# Patient Record
Sex: Female | Born: 1955 | Race: White | Hispanic: No | Marital: Married | Smoking: Former smoker
Health system: Southern US, Community
[De-identification: ages and names within clinical notes are randomized; demographics above are authoritative.]

## PROBLEM LIST (undated history)

## (undated) DIAGNOSIS — G8929 Other chronic pain: Secondary | ICD-10-CM

## (undated) DIAGNOSIS — F4310 Post-traumatic stress disorder, unspecified: Secondary | ICD-10-CM

## (undated) DIAGNOSIS — Z8739 Personal history of other diseases of the musculoskeletal system and connective tissue: Secondary | ICD-10-CM

## (undated) DIAGNOSIS — R7303 Prediabetes: Secondary | ICD-10-CM

## (undated) DIAGNOSIS — F329 Major depressive disorder, single episode, unspecified: Secondary | ICD-10-CM

## (undated) DIAGNOSIS — F988 Other specified behavioral and emotional disorders with onset usually occurring in childhood and adolescence: Secondary | ICD-10-CM

## (undated) DIAGNOSIS — M810 Age-related osteoporosis without current pathological fracture: Secondary | ICD-10-CM

## (undated) DIAGNOSIS — G9332 Myalgic encephalomyelitis/chronic fatigue syndrome: Secondary | ICD-10-CM

## (undated) DIAGNOSIS — B001 Herpesviral vesicular dermatitis: Secondary | ICD-10-CM

## (undated) DIAGNOSIS — Z87442 Personal history of urinary calculi: Secondary | ICD-10-CM

## (undated) DIAGNOSIS — T50905A Adverse effect of unspecified drugs, medicaments and biological substances, initial encounter: Secondary | ICD-10-CM

## (undated) DIAGNOSIS — R45851 Suicidal ideations: Secondary | ICD-10-CM

## (undated) DIAGNOSIS — J45909 Unspecified asthma, uncomplicated: Secondary | ICD-10-CM

## (undated) DIAGNOSIS — R0982 Postnasal drip: Secondary | ICD-10-CM

## (undated) DIAGNOSIS — D8989 Other specified disorders involving the immune mechanism, not elsewhere classified: Secondary | ICD-10-CM

## (undated) DIAGNOSIS — F19921 Other psychoactive substance use, unspecified with intoxication with delirium: Secondary | ICD-10-CM

## (undated) DIAGNOSIS — M4690 Unspecified inflammatory spondylopathy, site unspecified: Secondary | ICD-10-CM

## (undated) DIAGNOSIS — D649 Anemia, unspecified: Secondary | ICD-10-CM

## (undated) DIAGNOSIS — R4689 Other symptoms and signs involving appearance and behavior: Secondary | ICD-10-CM

## (undated) DIAGNOSIS — T4145XA Adverse effect of unspecified anesthetic, initial encounter: Secondary | ICD-10-CM

## (undated) DIAGNOSIS — M51369 Other intervertebral disc degeneration, lumbar region without mention of lumbar back pain or lower extremity pain: Secondary | ICD-10-CM

## (undated) DIAGNOSIS — N61 Mastitis without abscess: Secondary | ICD-10-CM

## (undated) DIAGNOSIS — Z803 Family history of malignant neoplasm of breast: Secondary | ICD-10-CM

## (undated) DIAGNOSIS — M5136 Other intervertebral disc degeneration, lumbar region: Secondary | ICD-10-CM

## (undated) DIAGNOSIS — R5382 Chronic fatigue, unspecified: Secondary | ICD-10-CM

## (undated) DIAGNOSIS — M069 Rheumatoid arthritis, unspecified: Secondary | ICD-10-CM

## (undated) DIAGNOSIS — R410 Disorientation, unspecified: Secondary | ICD-10-CM

## (undated) DIAGNOSIS — M797 Fibromyalgia: Secondary | ICD-10-CM

## (undated) DIAGNOSIS — F419 Anxiety disorder, unspecified: Secondary | ICD-10-CM

## (undated) DIAGNOSIS — I10 Essential (primary) hypertension: Secondary | ICD-10-CM

## (undated) DIAGNOSIS — M545 Low back pain, unspecified: Secondary | ICD-10-CM

## (undated) DIAGNOSIS — F32A Depression, unspecified: Secondary | ICD-10-CM

## (undated) DIAGNOSIS — M06811 Other specified rheumatoid arthritis, right shoulder: Secondary | ICD-10-CM

## (undated) DIAGNOSIS — T8859XA Other complications of anesthesia, initial encounter: Secondary | ICD-10-CM

## (undated) DIAGNOSIS — J189 Pneumonia, unspecified organism: Secondary | ICD-10-CM

## (undated) HISTORY — PX: JOINT REPLACEMENT: SHX530

## (undated) HISTORY — DX: Other symptoms and signs involving appearance and behavior: R46.89

## (undated) HISTORY — DX: Herpesviral vesicular dermatitis: B00.1

## (undated) HISTORY — DX: Age-related osteoporosis without current pathological fracture: M81.0

## (undated) HISTORY — DX: Family history of malignant neoplasm of breast: Z80.3

## (undated) HISTORY — PX: BACK SURGERY: SHX140

## (undated) HISTORY — PX: COLONOSCOPY: SHX174

## (undated) HISTORY — PX: POSTERIOR LUMBAR FUSION: SHX6036

---
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
## 1985-03-27 HISTORY — PX: TONSILLECTOMY: SUR1361

## 1986-03-27 HISTORY — PX: TUBAL LIGATION: SHX77

## 2001-03-27 HISTORY — PX: AUGMENTATION MAMMAPLASTY: SUR837

## 2001-03-27 HISTORY — PX: COMBINED ABDOMINOPLASTY AND LIPOSUCTION: SUR284

## 2006-01-19 ENCOUNTER — Emergency Department (HOSPITAL_COMMUNITY): Admission: EM | Admit: 2006-01-19 | Discharge: 2006-01-19 | Payer: Self-pay | Admitting: Family Medicine

## 2006-07-06 ENCOUNTER — Emergency Department (HOSPITAL_COMMUNITY): Admission: EM | Admit: 2006-07-06 | Discharge: 2006-07-06 | Payer: Self-pay | Admitting: Emergency Medicine

## 2006-07-08 ENCOUNTER — Encounter: Admission: RE | Admit: 2006-07-08 | Discharge: 2006-07-08 | Payer: Self-pay | Admitting: Family Medicine

## 2006-07-10 ENCOUNTER — Emergency Department (HOSPITAL_COMMUNITY): Admission: EM | Admit: 2006-07-10 | Discharge: 2006-07-10 | Payer: Self-pay | Admitting: *Deleted

## 2007-02-04 ENCOUNTER — Other Ambulatory Visit: Admission: RE | Admit: 2007-02-04 | Discharge: 2007-02-04 | Payer: Self-pay | Admitting: Family Medicine

## 2007-02-19 ENCOUNTER — Encounter: Admission: RE | Admit: 2007-02-19 | Discharge: 2007-02-19 | Payer: Self-pay | Admitting: Family Medicine

## 2007-03-28 HISTORY — PX: BLADDER SUSPENSION: SHX72

## 2008-03-04 ENCOUNTER — Other Ambulatory Visit: Admission: RE | Admit: 2008-03-04 | Discharge: 2008-03-04 | Payer: Self-pay | Admitting: Family Medicine

## 2008-07-15 ENCOUNTER — Encounter: Admission: RE | Admit: 2008-07-15 | Discharge: 2008-07-15 | Payer: Self-pay | Admitting: Family Medicine

## 2008-07-31 ENCOUNTER — Ambulatory Visit (HOSPITAL_BASED_OUTPATIENT_CLINIC_OR_DEPARTMENT_OTHER): Admission: RE | Admit: 2008-07-31 | Discharge: 2008-07-31 | Payer: Self-pay | Admitting: Urology

## 2008-10-28 ENCOUNTER — Encounter: Admission: RE | Admit: 2008-10-28 | Discharge: 2008-12-02 | Payer: Self-pay | Admitting: Family Medicine

## 2008-12-12 LAB — HM COLONOSCOPY: HM Colonoscopy: NEGATIVE

## 2008-12-14 ENCOUNTER — Encounter: Admission: RE | Admit: 2008-12-14 | Discharge: 2008-12-14 | Payer: Self-pay | Admitting: Family Medicine

## 2009-01-05 ENCOUNTER — Encounter: Admission: RE | Admit: 2009-01-05 | Discharge: 2009-01-05 | Payer: Self-pay | Admitting: Family Medicine

## 2009-01-06 ENCOUNTER — Encounter: Admission: RE | Admit: 2009-01-06 | Discharge: 2009-01-06 | Payer: Self-pay | Admitting: Family Medicine

## 2009-06-06 ENCOUNTER — Emergency Department (HOSPITAL_COMMUNITY): Admission: EM | Admit: 2009-06-06 | Discharge: 2009-06-07 | Payer: Self-pay | Admitting: Emergency Medicine

## 2009-06-12 ENCOUNTER — Emergency Department (HOSPITAL_COMMUNITY): Admission: EM | Admit: 2009-06-12 | Discharge: 2009-06-12 | Payer: Self-pay | Admitting: Emergency Medicine

## 2009-06-15 ENCOUNTER — Encounter: Admission: RE | Admit: 2009-06-15 | Discharge: 2009-06-15 | Payer: Self-pay | Admitting: Family Medicine

## 2009-08-20 ENCOUNTER — Ambulatory Visit: Payer: Self-pay | Admitting: Internal Medicine

## 2009-08-20 DIAGNOSIS — M545 Low back pain, unspecified: Secondary | ICD-10-CM | POA: Insufficient documentation

## 2009-08-20 DIAGNOSIS — J309 Allergic rhinitis, unspecified: Secondary | ICD-10-CM | POA: Insufficient documentation

## 2009-08-20 DIAGNOSIS — M2550 Pain in unspecified joint: Secondary | ICD-10-CM | POA: Insufficient documentation

## 2009-08-20 DIAGNOSIS — Z87442 Personal history of urinary calculi: Secondary | ICD-10-CM | POA: Insufficient documentation

## 2009-08-25 ENCOUNTER — Telehealth: Payer: Self-pay | Admitting: Internal Medicine

## 2009-08-25 LAB — CONVERTED CEMR LAB
ALT: 16 units/L (ref 0–35)
AST: 17 units/L (ref 0–37)
Albumin: 4.2 g/dL (ref 3.5–5.2)
Basophils Absolute: 0 10*3/uL (ref 0.0–0.1)
CO2: 30 meq/L (ref 19–32)
Chloride: 102 meq/L (ref 96–112)
Creatinine, Ser: 0.6 mg/dL (ref 0.4–1.2)
Eosinophils Absolute: 0 10*3/uL (ref 0.0–0.7)
GFR calc non Af Amer: 109.08 mL/min (ref >60)
HCT: 37.5 % (ref 36.0–46.0)
Ketones, ur: NEGATIVE mg/dL
MCHC: 34.4 g/dL (ref 30.0–36.0)
Monocytes Absolute: 0.4 10*3/uL (ref 0.1–1.0)
Platelets: 386 10*3/uL (ref 150.0–400.0)
Potassium: 4.5 meq/L (ref 3.5–5.1)
RBC: 4.49 M/uL (ref 3.87–5.11)
RDW: 13.3 % (ref 11.5–14.6)
Rhuematoid fact SerPl-aCnc: 25.3 intl units/mL — ABNORMAL HIGH (ref 0.0–20.0)
Sodium: 141 meq/L (ref 135–145)
Specific Gravity, Urine: 1.015 (ref 1.000–1.030)
Total Protein, Urine: NEGATIVE mg/dL
Total Protein: 7.3 g/dL (ref 6.0–8.3)
Urobilinogen, UA: 0.2 (ref 0.0–1.0)
pH: 6 (ref 5.0–8.0)

## 2009-08-30 ENCOUNTER — Encounter: Payer: Self-pay | Admitting: Internal Medicine

## 2009-10-21 ENCOUNTER — Emergency Department (HOSPITAL_COMMUNITY): Admission: EM | Admit: 2009-10-21 | Discharge: 2009-10-21 | Payer: Self-pay | Admitting: Family Medicine

## 2009-11-04 ENCOUNTER — Telehealth: Payer: Self-pay | Admitting: Internal Medicine

## 2009-11-15 ENCOUNTER — Ambulatory Visit: Payer: Self-pay | Admitting: Licensed Clinical Social Worker

## 2009-11-16 ENCOUNTER — Telehealth: Payer: Self-pay | Admitting: Internal Medicine

## 2009-11-24 ENCOUNTER — Telehealth: Payer: Self-pay | Admitting: Internal Medicine

## 2009-11-26 ENCOUNTER — Ambulatory Visit: Payer: Self-pay | Admitting: Licensed Clinical Social Worker

## 2009-12-01 ENCOUNTER — Encounter: Admission: RE | Admit: 2009-12-01 | Discharge: 2009-12-01 | Payer: Self-pay | Admitting: Orthopedic Surgery

## 2009-12-03 ENCOUNTER — Encounter (INDEPENDENT_AMBULATORY_CARE_PROVIDER_SITE_OTHER): Payer: Self-pay | Admitting: *Deleted

## 2009-12-14 ENCOUNTER — Emergency Department (HOSPITAL_COMMUNITY): Admission: EM | Admit: 2009-12-14 | Discharge: 2009-12-14 | Payer: Self-pay | Admitting: Family Medicine

## 2009-12-30 ENCOUNTER — Encounter
Admission: RE | Admit: 2009-12-30 | Discharge: 2009-12-30 | Payer: Self-pay | Source: Home / Self Care | Attending: Physical Medicine & Rehabilitation | Admitting: Physical Medicine & Rehabilitation

## 2010-02-11 ENCOUNTER — Ambulatory Visit: Payer: Self-pay | Admitting: Licensed Clinical Social Worker

## 2010-02-21 ENCOUNTER — Telehealth: Payer: Self-pay | Admitting: Internal Medicine

## 2010-03-27 HISTORY — PX: SHOULDER ARTHROSCOPY: SHX128

## 2010-04-13 LAB — HM PAP SMEAR: HM Pap smear: NEGATIVE

## 2010-04-17 ENCOUNTER — Encounter: Payer: Self-pay | Admitting: Family Medicine

## 2010-04-26 NOTE — Progress Notes (Signed)
Summary: RESULTS  Phone Note Call from Patient Call back at [phone]   Summary of Call: Patient is requesting results of labs. The steriod injection did no help w/pain. She is req rx for pain.  Initial call taken by: Lamar Sprinkles, CMA,  August 25, 2009 11:48 AM  Follow-up for Phone Call        she has a very mild, probably insignificant increase in her rheumatoid factor but all is else is normal Follow-up by: Etta Grandchild MD,  August 25, 2009 11:53 AM  Additional Follow-up for Phone Call Additional follow up Details #1::        Patient is requesting rx for pain. Additional Follow-up by: Lamar Sprinkles, CMA,  August 25, 2009 11:55 AM    Additional Follow-up for Phone Call Additional follow up Details #2::    Spoke with pt and advised per MD/ rx put upfront to pick up.Alvy Beal Archie CMA  August 25, 2009 4:26 PM   New/Updated Medications: BUTRANS 5 MCG/HR PTWK (BUPRENORPHINE) Apply one each week. Remove after 7 days and apply a new one if needed. Prescriptions: BUTRANS 5 MCG/HR PTWK (BUPRENORPHINE) Apply one each week. Remove after 7 days and apply a new one if needed.  #4 x 5   Entered and Authorized by:   Etta Grandchild MD   Signed by:   Etta Grandchild MD on 08/25/2009   Method used:   Print then Give to Patient   RxID:   [id number]

## 2010-04-26 NOTE — Progress Notes (Signed)
Summary: Pain med  Phone Note Call from Patient Call back at [phone]   Summary of Call: Patient is requesting a refill of the last pain med given by Dr Yetta Barre. She says pain med given by ortho was too strong and then they gave her tramadol, which does not help.  Initial call taken by: Lamar Sprinkles, CMA,  November 04, 2009 2:50 PM  Follow-up for Phone Call        she has 5 refills on butrans- I have nothing else to offer Follow-up by: Etta Grandchild MD,  November 04, 2009 2:58 PM  Additional Follow-up for Phone Call Additional follow up Details #1::        left mess to call office back..................Marland KitchenLamar Sprinkles, CMA  November 04, 2009 4:50 PM   Pt informed  Additional Follow-up by: Lamar Sprinkles, CMA,  November 04, 2009 4:58 PM

## 2010-04-26 NOTE — Letter (Signed)
Summary: [facility] Consult Scheduled Letter  [facility]  [address] [place], [place] [address]   Phone: [phone]  Fax: [phone]      12/03/2009 MRN: [id number]  [facility] [address] [place], [place]  [address]    Dear Ms. Conni Elliot,      We have scheduled an appointment for you.  At the recommendation of Dr.Jones, we have scheduled you a consult with Dr Wynn Banker on 01/04/10 at 12:00pm.  Their phone number is [phone].  If this appointment day and time is not convenient for you, please feel free to call the office of the doctor you are being referred to at the number listed above and reschedule the appointment.    [facility] [address] [place], [address] [place], [place] [address]    Thank you,  Patient Care Coordinator [facility]

## 2010-04-26 NOTE — Assessment & Plan Note (Signed)
Summary: NEW/UNITED HC/#/ CD   Vital Signs:  Patient profile:   55 year old female Height:      64 inches Weight:      164 pounds BMI:     28.25 O2 Sat:      97 % on Room air Temp:     98.9 degrees F oral Pulse rate:   74 / minute Pulse rhythm:   regular Resp:     16 per minute BP sitting:   120 / 84  (left arm) Cuff size:   large  Vitals Entered By: Rock Nephew CMA (Aug 20, 2009 1:01 PM)  Nutrition Counseling: Patient's BMI is greater than 25 and therefore counseled on weight management options.  O2 Flow:  Room air  Primary Care Provider:  Etta Grandchild MD   History of Present Illness: New to me she complains of a 6 month hx. of pain and swelling in her hands (MCP,DIP,PIP joints). She also has LBP and has seen Dr. 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 She will see Dr. Dierdre Forth soon but she wants something for pain today. She can't take meds due to allergies.  Preventive Screening-Counseling & Management  Alcohol-Tobacco     Smoking Status: never  Caffeine-Diet-Exercise     Does Patient Exercise: yes      Drug Use:  no.    Allergies (verified): 1)  ! Asa 2)  ! Ibuprofen 3)  ! Tramadol Hcl (Tramadol Hcl)  Past History:  Past Medical History: Allergic rhinitis Low back pain Nephrolithiasis, hx of  Past Surgical History: Tubal ligation Tonsillectomy  Family History: Family History of Arthritis Family History Breast cancer 1st degree relative <50 Family History Ovarian cancer  Social History: Occupation: works for [employer] [place] Married Never Smoked Alcohol use-no Drug use-no Regular exercise-yes Smoking Status:  never Drug Use:  no Does Patient Exercise:  yes  Review of Systems       The patient complains of weight gain.  The patient denies anorexia, fever, weight loss, chest pain, syncope, dyspnea on exertion, peripheral edema, prolonged cough, headaches, hemoptysis, abdominal pain, melena, hematochezia, severe  indigestion/heartburn, hematuria, depression, angioedema, and breast masses.   MS:  Complains of joint pain, low back pain, and stiffness; denies joint redness, joint swelling, loss of strength, mid back pain, muscle aches, muscle, cramps, muscle weakness, and thoracic pain.  Physical Exam  General:  alert, well-developed, well-nourished, well-hydrated, appropriate dress, normal appearance, healthy-appearing, cooperative to examination, good hygiene, and overweight-appearing.   Head:  normocephalic, atraumatic, no abnormalities observed, and no abnormalities palpated.   Eyes:  vision grossly intact.   Ears:  R ear normal and L ear normal.   Mouth:  Oral mucosa and oropharynx without lesions or exudates.  Teeth in good repair. Neck:  supple, full ROM, no masses, no thyromegaly, no JVD, normal carotid upstroke, no carotid bruits, no cervical lymphadenopathy, and no neck tenderness.   Lungs:  Normal respiratory effort, chest expands symmetrically. Lungs are clear to auscultation, no crackles or wheezes. Heart:  Normal rate and regular rhythm. S1 and S2 normal without gallop, murmur, click, rub or other extra sounds. Abdomen:  soft, non-tender, normal bowel sounds, no distention, no masses, no guarding, no rigidity, no rebound tenderness, no abdominal hernia, no inguinal hernia, no hepatomegaly, and no splenomegaly.   Msk:  normal ROM, no joint tenderness, no joint warmth, no redness over joints, no joint deformities, no joint instability, no crepitation, no muscle atrophy,  enlarged MCP joints, enlarged PIP joints, and enlarged DIP joints.   Pulses:  R and L carotid,radial,femoral,dorsalis pedis and posterior tibial pulses are full and equal bilaterally Extremities:  No clubbing, cyanosis, edema, or deformity noted with normal full range of motion of all joints.   Neurologic:  No cranial nerve deficits noted. Station and gait are normal. Plantar reflexes are down-going bilaterally. DTRs are symmetrical  throughout. Sensory, motor and coordinative functions appear intact. Skin:  turgor normal, color normal, no rashes, no suspicious lesions, no ecchymoses, no petechiae, no purpura, no ulcerations, and no edema.   Cervical Nodes:  no anterior cervical adenopathy and no posterior cervical adenopathy.   Axillary Nodes:  no R axillary adenopathy and no L axillary adenopathy.   Inguinal Nodes:  no R inguinal adenopathy and no L inguinal adenopathy.   Psych:  Cognition and judgment appear intact. Alert and cooperative with normal attention span and concentration. No apparent delusions, illusions, hallucinations   Impression & Recommendations:  Problem # 1:  ARTHRALGIA (ICD-719.40) Assessment New this sounds like OA but I have a litlle concern about CTDz. so will look at some inflammatory markers. will give steroids fr short term symptom relief. she will keep appt. with Rheum. Orders: Venipuncture (60630) TLB-BMP (Basic Metabolic Panel-BMET) (80048-METABOL) TLB-CBC Platelet - w/Differential (85025-CBCD) TLB-Hepatic/Liver Function Pnl (80076-HEPATIC) TLB-TSH (Thyroid Stimulating Hormone) (84443-TSH) TLB-Rheumatoid Factor (RA) (16010-XN) TLB-Sedimentation Rate (ESR) (85652-ESR) T-Antinuclear Antib (ANA) (23557-32202) TLB-CRP-High Sensitivity (C-Reactive Protein) (86140-FCRP) TLB-Udip w/ Micro (81001-URINE) TLB-CK Total Only(Creatine Kinase/CPK) (82550-CK) Admin of Therapeutic Inj  intramuscular or subcutaneous (54270) Depo- Medrol 40mg  (J1030) Depo- Medrol 80mg  (J1040)  Problem # 2:  LOW BACK PAIN (ICD-724.2) Assessment: Unchanged  Complete Medication List: 1)  Effexor Xr 75 Mg Xr24h-cap (Venlafaxine hcl) .... Take 1 tablet by mouth three times a day 2)  Vyvanse 40 Mg Caps (Lisdexamfetamine dimesylate) 3)  Epi Pen   Patient Instructions: 1)  Please schedule a follow-up appointment in 2 weeks. 2)  It is important that you exercise regularly at least 20 minutes 5 times a week. If you  develop chest pain, have severe difficulty breathing, or feel very tired , stop exercising immediately and seek medical attention. 3)  You need to lose weight. Consider a lower calorie diet and regular exercise.  4)  Take 650-1000mg  of Tylenol every 4-6 hours as needed for relief of pain or comfort of fever AVOID taking more than 4000mg   in a 24 hour period (can cause liver damage in higher doses).  Preventive Care Screening  Last Tetanus Booster:    Date:  03/27/2009    Results:  Historical     Not Administered:    Influenza Vaccine not given due to: declined    Medication Administration  Injection # 1:    Medication: Depo- Medrol 80mg     Diagnosis: ARTHRALGIA (ICD-719.40)    Route: IM    Site: RUOQ gluteus    Exp Date: 06/2012    Lot #: [id number]    Mfr: pfizer    Patient tolerated injection without complications    Given by: Rock Nephew CMA (Aug 20, 2009 1:34 PM)  Injection # 2:    Medication: Depo- Medrol 40mg     Diagnosis: ARTHRALGIA (ICD-719.40)    Route: IM    Site: RUOQ gluteus    Exp Date: 06/2012    Lot #: [id number]    Mfr: pfizer    Patient tolerated injection without complications    Given by: Rock Nephew CMA (Aug 20, 2009 1:34  PM)  Orders Added: 1)  Venipuncture [36415] 2)  TLB-BMP (Basic Metabolic Panel-BMET) [80048-METABOL] 3)  TLB-CBC Platelet - w/Differential [85025-CBCD] 4)  TLB-Hepatic/Liver Function Pnl [80076-HEPATIC] 5)  TLB-TSH (Thyroid Stimulating Hormone) [84443-TSH] 6)  TLB-Rheumatoid Factor (RA) [16109-UE] 7)  TLB-Sedimentation Rate (ESR) [85652-ESR] 8)  T-Antinuclear Antib (ANA) [45409-81191] 9)  TLB-CRP-High Sensitivity (C-Reactive Protein) [86140-FCRP] 10)  TLB-Udip w/ Micro [81001-URINE] 11)  TLB-CK Total Only(Creatine Kinase/CPK) [82550-CK] 12)  Admin of Therapeutic Inj  intramuscular or subcutaneous [96372] 13)  Depo- Medrol 40mg  [J1030] 14)  Depo- Medrol 80mg  [J1040] 15)  New Patient Level IV [47829]

## 2010-04-26 NOTE — Progress Notes (Signed)
Summary: OV?   Phone Note Call from Patient Call back at [phone]   Summary of Call: Pt c/o no relief from pain w/pain patches. Has tried tramadol in the past w/no relief. Patient is requesting rx. Or do you need to see pt for office visit?  Initial call taken by: Lamar Sprinkles, CMA,  November 24, 2009 11:47 AM  Follow-up for Phone Call        she needs to go to a pain specialist Follow-up by: Etta Grandchild MD,  November 24, 2009 12:00 PM  Additional Follow-up for Phone Call Additional follow up Details #1::        Any suggestions while waiting for apt?  Additional Follow-up by: Lamar Sprinkles, CMA,  November 24, 2009 12:08 PM    Additional Follow-up for Phone Call Additional follow up Details #2::    no Follow-up by: Etta Grandchild MD,  November 24, 2009 1:10 PM

## 2010-04-26 NOTE — Progress Notes (Signed)
Summary: Med refill  Phone Note From Pharmacy   Caller: [facility] Summary of Call: received fax from [facility] (case # [id number] mem# [phone]) requesting Valacyclovir HCL 500mg  tabs for 90day supply. This medication is not on current med list. Please advise, If ok please advise sig. thanks Initial call taken by: Rock Nephew CMA,  November 16, 2009 12:34 PM  Follow-up for Phone Call        why does she take this? 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 Additional Follow-up by: Lucious Groves CMA,  November 17, 2009 9:46 AM    Additional Follow-up for Phone Call Additional follow up Details #2::    prescription faxed to [facility] per MD request. Follow-up by: Lucious Groves CMA,  November 17, 2009 10:26 AM  New/Updated Medications: VALACYCLOVIR HCL 500 MG TABS (VALACYCLOVIR HCL) One by mouth once daily Prescriptions: VALACYCLOVIR HCL 500 MG TABS (VALACYCLOVIR HCL) One by mouth once daily  #90 x 3   Entered and Authorized by:   Etta Grandchild MD   Signed by:   Etta Grandchild MD on 11/17/2009   Method used:   Printed then faxed to ...       [facility] (mail-order)             , [place]         Ph: [phone]       Fax: [phone]   RxID:   [id number]

## 2010-04-26 NOTE — Letter (Signed)
Summary: [facility]   Imported By: Lester [place] 09/07/2009 12:50:41  _____________________________________________________________________  External Attachment:    Type:   Image     Comment:   External Document

## 2010-04-28 NOTE — Progress Notes (Addendum)
  Phone Note Call from Patient   Caller: Patient Summary of Call: FYI.Marland KitchenMarland KitchenMarland KitchenPatient called stating that she declines any futhur appt for pain clinic and ask that nothing else is set up. Per pt, she no longer sees this PCP.[name] Thanks  Schedulers notified and [facility].Marland KitchenAlvy Beal Archie CMA  February 21, 2010 11:38 AM      Appended Document:  Please dont make any appt// pt no longer see MD

## 2010-05-30 ENCOUNTER — Other Ambulatory Visit (HOSPITAL_COMMUNITY): Payer: Self-pay | Admitting: Neurological Surgery

## 2010-05-30 ENCOUNTER — Encounter (HOSPITAL_COMMUNITY)
Admission: RE | Admit: 2010-05-30 | Discharge: 2010-05-30 | Disposition: A | Discharge status: Home / Self Care | Payer: 59 | Source: Ambulatory Visit | Attending: Neurological Surgery | Admitting: Neurological Surgery

## 2010-05-30 DIAGNOSIS — Z01812 Encounter for preprocedural laboratory examination: Secondary | ICD-10-CM | POA: Insufficient documentation

## 2010-05-30 DIAGNOSIS — Z01811 Encounter for preprocedural respiratory examination: Secondary | ICD-10-CM

## 2010-05-30 DIAGNOSIS — Z01810 Encounter for preprocedural cardiovascular examination: Secondary | ICD-10-CM | POA: Insufficient documentation

## 2010-05-30 DIAGNOSIS — Z01818 Encounter for other preprocedural examination: Secondary | ICD-10-CM | POA: Insufficient documentation

## 2010-05-30 LAB — BASIC METABOLIC PANEL
BUN: 13 mg/dL (ref 6–23)
CO2: 30 mEq/L (ref 19–32)
Calcium: 9.9 mg/dL (ref 8.4–10.5)
GFR calc non Af Amer: >60 mL/min (ref >60)
Glucose, Bld: 90 mg/dL (ref 70–99)
Sodium: 141 mEq/L (ref 135–145)

## 2010-05-30 LAB — CBC
Hemoglobin: 13.6 g/dL (ref 12.0–15.0)
MCH: 27.7 pg (ref 26.0–34.0)
MCHC: 32.2 g/dL (ref 30.0–36.0)
Platelets: 376 10*3/uL (ref 150–400)
RDW: 13.3 % (ref 11.5–15.5)

## 2010-05-30 LAB — DIFFERENTIAL
Basophils Absolute: 0 10*3/uL (ref 0.0–0.1)
Basophils Relative: 0 % (ref 0–1)
Eosinophils Absolute: 0.1 10*3/uL (ref 0.0–0.7)
Eosinophils Relative: 1 % (ref 0–5)
Monocytes Absolute: 0.5 10*3/uL (ref 0.1–1.0)
Monocytes Relative: 7 % (ref 3–12)
Neutro Abs: 4.5 10*3/uL (ref 1.7–7.7)

## 2010-05-30 LAB — PROTIME-INR
INR: 0.93 (ref 0.00–1.49)
Prothrombin Time: 12.7 seconds (ref 11.6–15.2)

## 2010-05-30 LAB — ABO/RH: ABO/RH(D): O NEG

## 2010-05-30 LAB — TYPE AND SCREEN

## 2010-06-03 ENCOUNTER — Inpatient Hospital Stay (HOSPITAL_COMMUNITY): Payer: 59

## 2010-06-03 ENCOUNTER — Inpatient Hospital Stay (HOSPITAL_COMMUNITY)
Admission: RE | Admit: 2010-06-03 | Discharge: 2010-06-04 | DRG: 455 | Disposition: A | Discharge status: Home / Self Care | Payer: 59 | Source: Ambulatory Visit | Attending: Neurological Surgery | Admitting: Neurological Surgery

## 2010-06-03 DIAGNOSIS — Q762 Congenital spondylolisthesis: Principal | ICD-10-CM

## 2010-06-03 DIAGNOSIS — Z01812 Encounter for preprocedural laboratory examination: Secondary | ICD-10-CM

## 2010-06-03 DIAGNOSIS — M47817 Spondylosis without myelopathy or radiculopathy, lumbosacral region: Secondary | ICD-10-CM | POA: Diagnosis present

## 2010-06-03 DIAGNOSIS — Z79899 Other long term (current) drug therapy: Secondary | ICD-10-CM

## 2010-06-06 ENCOUNTER — Emergency Department (HOSPITAL_COMMUNITY)
Admission: EM | Admit: 2010-06-06 | Discharge: 2010-06-07 | Disposition: A | Discharge status: Home / Self Care | Payer: 59 | Attending: Emergency Medicine | Admitting: Emergency Medicine

## 2010-06-06 DIAGNOSIS — T40605A Adverse effect of unspecified narcotics, initial encounter: Secondary | ICD-10-CM | POA: Insufficient documentation

## 2010-06-06 DIAGNOSIS — M545 Low back pain, unspecified: Secondary | ICD-10-CM | POA: Insufficient documentation

## 2010-06-06 DIAGNOSIS — Z981 Arthrodesis status: Secondary | ICD-10-CM | POA: Insufficient documentation

## 2010-06-06 DIAGNOSIS — F988 Other specified behavioral and emotional disorders with onset usually occurring in childhood and adolescence: Secondary | ICD-10-CM | POA: Insufficient documentation

## 2010-06-06 DIAGNOSIS — F19921 Other psychoactive substance use, unspecified with intoxication with delirium: Secondary | ICD-10-CM | POA: Insufficient documentation

## 2010-06-06 DIAGNOSIS — F29 Unspecified psychosis not due to a substance or known physiological condition: Secondary | ICD-10-CM | POA: Insufficient documentation

## 2010-06-06 LAB — DIFFERENTIAL
Basophils Relative: 0 % (ref 0–1)
Eosinophils Absolute: 0.2 10*3/uL (ref 0.0–0.7)
Lymphs Abs: 2.8 10*3/uL (ref 0.7–4.0)
Neutro Abs: 7.4 10*3/uL (ref 1.7–7.7)
Neutrophils Relative %: 67 % (ref 43–77)

## 2010-06-06 LAB — CBC
Hemoglobin: 13 g/dL (ref 12.0–15.0)
Platelets: 368 10*3/uL (ref 150–400)
RBC: 4.52 MIL/uL (ref 3.87–5.11)
WBC: 11.2 10*3/uL — ABNORMAL HIGH (ref 4.0–10.5)

## 2010-06-07 LAB — URINALYSIS, ROUTINE W REFLEX MICROSCOPIC
Glucose, UA: NEGATIVE mg/dL
Leukocytes, UA: NEGATIVE
Nitrite: NEGATIVE
Specific Gravity, Urine: 1.03 (ref 1.005–1.030)
pH: 5 (ref 5.0–8.0)

## 2010-06-07 LAB — BASIC METABOLIC PANEL
BUN: 14 mg/dL (ref 6–23)
Calcium: 8.8 mg/dL (ref 8.4–10.5)
Creatinine, Ser: 0.89 mg/dL (ref 0.4–1.2)
GFR calc non Af Amer: >60 mL/min (ref >60)
Glucose, Bld: 194 mg/dL — ABNORMAL HIGH (ref 70–99)

## 2010-06-07 LAB — URINE MICROSCOPIC-ADD ON

## 2010-06-09 ENCOUNTER — Inpatient Hospital Stay (HOSPITAL_COMMUNITY)
Admission: EM | Admit: 2010-06-09 | Discharge: 2010-06-12 | DRG: 552 | Disposition: A | Discharge status: Home / Self Care | Payer: 59 | Attending: Internal Medicine | Admitting: Internal Medicine

## 2010-06-09 ENCOUNTER — Emergency Department (HOSPITAL_COMMUNITY)
Admission: EM | Admit: 2010-06-09 | Discharge: 2010-06-09 | Disposition: A | Discharge status: Home / Self Care | Payer: 59 | Attending: Emergency Medicine | Admitting: Emergency Medicine

## 2010-06-09 DIAGNOSIS — F341 Dysthymic disorder: Secondary | ICD-10-CM | POA: Diagnosis present

## 2010-06-09 DIAGNOSIS — I10 Essential (primary) hypertension: Secondary | ICD-10-CM | POA: Diagnosis present

## 2010-06-09 DIAGNOSIS — Z981 Arthrodesis status: Secondary | ICD-10-CM | POA: Insufficient documentation

## 2010-06-09 DIAGNOSIS — G8918 Other acute postprocedural pain: Secondary | ICD-10-CM | POA: Insufficient documentation

## 2010-06-09 DIAGNOSIS — K5900 Constipation, unspecified: Secondary | ICD-10-CM | POA: Diagnosis present

## 2010-06-09 DIAGNOSIS — M545 Low back pain, unspecified: Principal | ICD-10-CM | POA: Diagnosis present

## 2010-06-09 DIAGNOSIS — F988 Other specified behavioral and emotional disorders with onset usually occurring in childhood and adolescence: Secondary | ICD-10-CM | POA: Diagnosis present

## 2010-06-09 DIAGNOSIS — R030 Elevated blood-pressure reading, without diagnosis of hypertension: Secondary | ICD-10-CM | POA: Diagnosis present

## 2010-06-09 LAB — CBC
HCT: 34.2 % — ABNORMAL LOW (ref 36.0–46.0)
MCHC: 32.5 g/dL (ref 30.0–36.0)
MCV: 88.4 fL (ref 78.0–100.0)
MCV: 86.4 fL (ref 78.0–100.0)
Platelets: 353 10*3/uL (ref 150–400)
RDW: 13 % (ref 11.5–15.5)
RDW: 13.1 % (ref 11.5–15.5)
WBC: 8.4 10*3/uL (ref 4.0–10.5)

## 2010-06-09 LAB — DIFFERENTIAL
Basophils Absolute: 0 10*3/uL (ref 0.0–0.1)
Basophils Relative: 1 % (ref 0–1)
Eosinophils Absolute: 0.2 10*3/uL (ref 0.0–0.7)
Eosinophils Relative: 2 % (ref 0–5)
Eosinophils Relative: 2 % (ref 0–5)
Lymphocytes Relative: 24 % (ref 12–46)
Lymphs Abs: 2.4 10*3/uL (ref 0.7–4.0)
Lymphs Abs: 2.1 10*3/uL (ref 0.7–4.0)
Monocytes Absolute: 0.6 10*3/uL (ref 0.1–1.0)
Monocytes Relative: 7 % (ref 3–12)

## 2010-06-09 LAB — BASIC METABOLIC PANEL
CO2: 29 mEq/L (ref 19–32)
Calcium: 8.9 mg/dL (ref 8.4–10.5)
Glucose, Bld: 97 mg/dL (ref 70–99)
Potassium: 3.8 mEq/L (ref 3.5–5.1)
Sodium: 137 mEq/L (ref 135–145)

## 2010-06-09 LAB — URINALYSIS, ROUTINE W REFLEX MICROSCOPIC
Bilirubin Urine: NEGATIVE
Nitrite: NEGATIVE
Specific Gravity, Urine: 1.024 (ref 1.005–1.030)
Urobilinogen, UA: 0.2 mg/dL (ref 0.0–1.0)

## 2010-06-09 LAB — URINE MICROSCOPIC-ADD ON

## 2010-06-10 ENCOUNTER — Inpatient Hospital Stay (HOSPITAL_COMMUNITY): Payer: 59

## 2010-06-10 LAB — COMPREHENSIVE METABOLIC PANEL
ALT: 93 U/L — ABNORMAL HIGH (ref 0–35)
AST: 44 U/L — ABNORMAL HIGH (ref 0–37)
Alkaline Phosphatase: 145 U/L — ABNORMAL HIGH (ref 39–117)
CO2: 31 mEq/L (ref 19–32)
Calcium: 8.5 mg/dL (ref 8.4–10.5)
GFR calc Af Amer: >60 mL/min (ref >60)
GFR calc non Af Amer: >60 mL/min (ref >60)
Glucose, Bld: 98 mg/dL (ref 70–99)
Potassium: 3.4 mEq/L — ABNORMAL LOW (ref 3.5–5.1)
Sodium: 137 mEq/L (ref 135–145)

## 2010-06-10 LAB — CBC
HCT: 34.5 % — ABNORMAL LOW (ref 36.0–46.0)
Hemoglobin: 11.1 g/dL — ABNORMAL LOW (ref 12.0–15.0)
MCHC: 32.2 g/dL (ref 30.0–36.0)
RBC: 3.98 MIL/uL (ref 3.87–5.11)
WBC: 9.5 10*3/uL (ref 4.0–10.5)

## 2010-06-10 LAB — BASIC METABOLIC PANEL
Chloride: 99 mEq/L (ref 96–112)
Creatinine, Ser: 0.66 mg/dL (ref 0.4–1.2)
GFR calc Af Amer: >60 mL/min (ref >60)
Potassium: 3.9 mEq/L (ref 3.5–5.1)

## 2010-06-10 LAB — RAPID URINE DRUG SCREEN, HOSP PERFORMED: Tetrahydrocannabinol: NOT DETECTED

## 2010-06-10 NOTE — H&P (Signed)
Kara [name], Kara [name] NO.:  [id number]  MEDICAL RECORD NO.:  [id number]           PATIENT TYPE:  E  LOCATION:  [facility]                         FACILITY:  [facility]  PHYSICIAN:  Talmage Nap, MD  DATE OF BIRTH:  12/26/1955  DATE OF ADMISSION:  06/09/2010 DATE OF DISCHARGE:                             HISTORY & PHYSICAL   PRIMARY CARE PHYSICIAN:  [name].  NEUROSURGEON:  Tia Alert, MD  History obtained from the patient and the patient's spouse.  CHIEF COMPLAINT:  Back pain of unspecified duration.  The patient is a 55 year old Caucasian/Hispanic female with a history of anxiety disorder, chronic back pain, status post fusion of L4-L5, which was done on June 03, 2010, presenting to the emergency room with pain in the lumbar region, which has been on prior to surgery and still persisting post surgery, and this pain was said to have been getting progressively worse despite analgesia.  The patient described the pain as achy, occasionally radiating to the lower extremity with difficulty in ambulation.  She denied any associated systemic symptoms.  No fever. No chills.  No rigor.  She also denied any involvement of the urinary or the gastrointestinal system, i.e., no fecal or urinary incontinence. She denied any dysuria.  The pain was said to be unbearable and subsequently, the patient was brought to the emergency room by her spouse to be evaluated.  PAST MEDICAL HISTORY:  Positive for chronic low back pain, anxiety disorder, attention deficit disorder, depression, history of kidney stones, and history of mitral valve prolapse.  PAST SURGICAL HISTORY:  Tubal ligation, history of tonsillectomy, and most recently vertebral fusion of L4-L5.  She has no known documented preadmission meds.  ALLERGIES:  ASPIRIN, CODEINE, IBUPROFEN, and TORADOL.  SOCIAL HISTORY:  Negative for alcohol, tobacco use.  FAMILY HISTORY:  Said to positive for  malignancy of unknown origin.  REVIEW OF SYSTEMS:  The patient denies any history of headaches.  No nausea or vomiting.  No fever.  No chills.  No rigor.  No chest pain or shortness of breath.  Complained of persistent and achy pain in the lower extremity with difficulty in ambulation.  No bowel or urinary involvement.  She also complained about constipation in the past 3-4 days.  She denies any swelling of the lower extremity.  No intolerance to heat or cold, and no neuropsychiatric disorder.  PHYSICAL EXAMINATION:  GENERAL:  Middle-aged lady crying secondary to pain, but not in any respiratory distress, well hydrated. PRESENT VITAL SIGNS:  Blood pressure is 173/83, pulse is 109, respiratory rate 18, temperature is 99.9. HEENT: Pupils are reactive to light and extraocular muscles are intact. NECK:  No jugular venous distention.  No carotid bruit.  No lymphadenopathy. CHEST:  Clear to auscultation. HEART:  Sounds are 1 and 2. ABDOMEN:  Soft, nontender.  Liver, spleen, kidneys not palpable.  Bowel sounds are positive. EXTREMITIES:  No pedal edema. NEUROLOGIC:  Nonfocal.Tenderness at level of L4 and L5 MUSCULOSKELETAL:  Surgical scar at the lumbar region and also at the right iliac region. NEUROPSYCHIATRIC:  Unremarkable. SKIN:  Normal turgor.  LABORATORY DATA:  Initial complete blood count with differential showed WBC of 8.4, hemoglobin 11.6, hematocrit 36.6, MCV 88.4, platelet count of 353, normal differential.  Urinalysis showed small leukocyte esterase.  Urine microscopy showed wbc's 3-6 and few bacteria.  Basic metabolic panel showed sodium of 137, potassium of 3.8, chloride of 101 with a bicarb of 28, glucose is 97, BUN is 10, creatinine is 0.61. Urine drug screen positive for benzodiazepines and opiates.  No imaging studies done.  IMPRESSION: 1. Chronic low back pain postoperative. 2. Constipation secondary to questionable narcotic analgesics use. 3. Anxiety disorder. 4.  Depression. 5. Attention deficit disorder. 6. Bacteriuria. 7. Elevated blood pressure.  Plan is to admit the patient to general medical floor.  The patient's pain will be controlled with Dilaudid 2 mg IV q.4 p.r.n.  This will be followed with Percocet 10/325 2 tablets p.o. q.4 p.r.n.  Because of the patient's longstanding constipation, she will be given Fleet Enema x1. This should be followed by Colace 100 mg p.o. b.i.d. p.r.n. for constipation.  Other medication to be given to the patient will include Robaxin 1500 mg p.o. t.i.d.  She will also be on Xanax 0.25 mg p.o. b.i.d. (scheduled) and Ambien 5 mg p.o. at bedtime p.r.n. for insomnia. The patient had elevated blood pressure, will be controlled with Lopressor 50 mg p.o. b.i.d.  She will be on Protonix 40 mg p.o. daily for GI prophylaxis.  Because of her bacteriuria, the patient will be on Levaquin 500 mg p.o. daily, and SCD boot of TED stockings for DVT prophylaxis.  Further labs to be ordered on this patient will include CBCD, CMP, and magnesium in a.m., and she will also have x-ray of the lumbosacral spine done and finally, physical therapy will be consulted for gradual ambulation of this patient.  The patient will be followed and evaluated on daily basis.     Talmage Nap, MD     CN/MEDQ  D:  06/10/2010  T:  06/10/2010  Job:  [id number]  Electronically Signed by Talmage Nap  on 06/10/2010 03:47:11 AM

## 2010-06-12 LAB — BASIC METABOLIC PANEL
BUN: 12 mg/dL (ref 6–23)
CO2: 30 mEq/L (ref 19–32)
Calcium: 9 mg/dL (ref 8.4–10.5)
Chloride: 100 mEq/L (ref 96–112)
Creatinine, Ser: 0.67 mg/dL (ref 0.4–1.2)
GFR calc Af Amer: >60 mL/min (ref >60)

## 2010-06-16 ENCOUNTER — Emergency Department (HOSPITAL_COMMUNITY)
Admission: EM | Admit: 2010-06-16 | Discharge: 2010-06-16 | Discharge status: Against Medical Advice | Payer: 59 | Attending: Emergency Medicine | Admitting: Emergency Medicine

## 2010-06-16 DIAGNOSIS — Z87442 Personal history of urinary calculi: Secondary | ICD-10-CM | POA: Insufficient documentation

## 2010-06-16 DIAGNOSIS — R300 Dysuria: Secondary | ICD-10-CM | POA: Insufficient documentation

## 2010-06-16 DIAGNOSIS — R112 Nausea with vomiting, unspecified: Secondary | ICD-10-CM | POA: Insufficient documentation

## 2010-06-16 DIAGNOSIS — G8929 Other chronic pain: Secondary | ICD-10-CM | POA: Insufficient documentation

## 2010-06-16 DIAGNOSIS — M549 Dorsalgia, unspecified: Secondary | ICD-10-CM | POA: Insufficient documentation

## 2010-06-16 LAB — URINALYSIS, ROUTINE W REFLEX MICROSCOPIC
Glucose, UA: NEGATIVE mg/dL
Ketones, ur: NEGATIVE mg/dL
Protein, ur: NEGATIVE mg/dL
Urobilinogen, UA: 0.2 mg/dL (ref 0.0–1.0)

## 2010-06-16 LAB — URINE MICROSCOPIC-ADD ON

## 2010-06-17 NOTE — Op Note (Signed)
NAMESUESAN, MOHRMANN NO.:  [id number]  MEDICAL RECORD NO.:  [id number]           PATIENT TYPE:  I  LOCATION:  3033                         FACILITY:  [facility]  PHYSICIAN:  Tia Alert, MD     DATE OF BIRTH:  1955-06-28  DATE OF PROCEDURE:  06/03/2010 DATE OF DISCHARGE:                              OPERATIVE REPORT   PREOPERATIVE DIAGNOSIS:  Spondylolisthesis with spondylosis at L4-5 with back and leg pain.  POSTOPERATIVE DIAGNOSIS:  Spondylolisthesis with spondylosis at L4-5 with back and leg pain.  PROCEDURES: 1. Anterolateral retroperitoneal interbody fusion at L4-5 utilizing a     10-mm PEEK interbody cage packed with Osteocel Plus and Actifuse     putty with lateral fixation. 2. Posterior interlaminar fusion L4-5 utilizing Osteocel Plus and     Actifuse putty. 3. Posterior fixation L4-5 utilizing via affix plate.  SURGEON:  Tia Alert, MD  ASSISTANT:  Donalee Citrin, MD  ANESTHESIA:  General endotracheal.COMPLICATIONS:  None apparent.  INDICATIONS FOR PROCEDURE:  Ms. Quigley is a 55 year old female who presented with a spondylolisthesis at L4-5 causing back pain with some leg pain.  She had a CT myelogram which showed a spondylolisthesis at L4- 5 with severe facet arthropathy, recommended a instrumented fusion at that level to address her segmental instability.  She understood the risks, benefits and expected outcome and wished to proceed.  DESCRIPTION OF THE PROCEDURE:  The patient was taken to the operating room and after induction of adequate generalized endotracheal anesthesia, she was placed in the right lateral decubitus position exposing her left side in a typical XLIF fashion.  Her left flank was cleaned with Hibiclens and prepped with DuraPrep and then draped in usual sterile fashion.  She was hooked to EMG monitoring which was used throughout the lateral part of the procedure.  An incision was made directly over to the L5 interspace and an  incision was made just posterolateral to this.  Blunt finger dissection was used to enter the retroperitoneal space.  I could feel the anterior face of the transverse process, the psoas musculature, and the iliac crest.  I swept my finger to the lateral incision and passed my first dilator down to the psoas musculature.  We checked our twitch test and then placed our first dilator over the center part of the disk space, checked EMG monitoring, got our K-wire into position.  Then used sequential dilation while testing EMG monitoring until our final retractor was in place.  We then checked our final retractor with AP and lateral fluoroscopy.  We got our shim into the disk space, opened the retractor further.  Checked once again with AP and lateral fluoroscopy, then incised the disk space and performed a thorough intradiskal diskectomy with pituitary rongeurs, curettes were used to release the disk from the endplates and released the opposite annulus.  Scrapers and shavers were used to prepare the endplates.  I then passed the 16-mm paddle across the opposite annulus and checked AP and lateral fluoroscopy to assure our trajectory.  I then used sequential trials and trial felt that the 10-mm standard  trial fit the best.  Therefore, we chose a 10 mm x 50 mm x 22 mm XLF cage and tapped this into position utilizing AP fluoroscopy at L4-5.  I then used the awl to prepare our screw holes.  I then tapped each hole with the drill and then placed 55 x 50 mm lateral screws.  The wound was then copiously irrigated with saline solution containing bacitracin, dried all bleeding points, checked our final construct, removed the retractor, and then closed the wounds in layers of 0 Vicryl in the fascia, 2-0 Vicryl in subcutaneous tissues, 3-0 Vicryl in the subcuticular tissues. The skin was closed with Dermabond.  The patient was then positioned in the prone position.  Her posterior lumbar region was prepped  with DuraPrep and then draped in the usual sterile fashion.  A 5 mL of local anesthesia was injected and a small dorsal midline incision was made over L4-5.  The paraspinous musculature was taken down subperiosteal fashion to expose the lamina.  Intraoperative fluoroscopy confirmed my level.  I then drilled the lamina on the right-hand side and placed a mixture of local autograft and Actifuse putty and then placed a medium affix plate.  I then checked the final construct with AP and lateral fluoroscopy, irrigated with saline solution containing bacitracin, dried all bleeding points and then closed the fascia with 0 Vicryl, the subcutaneous tissue with 2-0 Vicryl, the subcuticular tissue with 3-0 Vicryl, and the skin was closed with Benzoin and Steri-Strips.  The drapes were removed.  A sterile dressing was applied.  The patient was awakened from anesthesia and transferred to recovery room in stable condition.  At the end of the procedure, all sponge, needle and instrument counts were correct.     Tia Alert, MD     DSJ/MEDQ  D:  06/03/2010  T:  06/04/2010  Job:  [id number]  Electronically Signed by Marikay Alar MD on 06/17/2010 11:57:37 AM

## 2010-06-19 LAB — URINALYSIS, ROUTINE W REFLEX MICROSCOPIC
Glucose, UA: NEGATIVE mg/dL
Hgb urine dipstick: NEGATIVE
Ketones, ur: NEGATIVE mg/dL
Nitrite: NEGATIVE
Protein, ur: NEGATIVE mg/dL
Protein, ur: NEGATIVE mg/dL
Specific Gravity, Urine: 1.03 (ref 1.005–1.030)
Urobilinogen, UA: 0.2 mg/dL (ref 0.0–1.0)

## 2010-06-19 LAB — COMPREHENSIVE METABOLIC PANEL
ALT: 22 U/L (ref 0–35)
Albumin: 4.4 g/dL (ref 3.5–5.2)
Alkaline Phosphatase: 96 U/L (ref 39–117)
GFR calc Af Amer: >60 mL/min (ref >60)
Potassium: 4.7 mEq/L (ref 3.5–5.1)
Sodium: 137 mEq/L (ref 135–145)
Total Protein: 7.8 g/dL (ref 6.0–8.3)

## 2010-06-19 LAB — CBC
HCT: 38.2 % (ref 36.0–46.0)
MCHC: 32.8 g/dL (ref 30.0–36.0)
MCV: 85.1 fL (ref 78.0–100.0)
Platelets: 323 10*3/uL (ref 150–400)
Platelets: 357 10*3/uL (ref 150–400)
RDW: 12.9 % (ref 11.5–15.5)
WBC: 9.1 10*3/uL (ref 4.0–10.5)

## 2010-06-19 LAB — POCT I-STAT, CHEM 8
Calcium, Ion: 0.96 mmol/L — ABNORMAL LOW (ref 1.12–1.32)
Chloride: 110 mEq/L (ref 96–112)
Glucose, Bld: 80 mg/dL (ref 70–99)
HCT: 42 % (ref 36.0–46.0)

## 2010-06-19 LAB — URINE CULTURE

## 2010-06-19 LAB — DIFFERENTIAL
Basophils Relative: 0 % (ref 0–1)
Basophils Relative: 1 % (ref 0–1)
Eosinophils Absolute: 0 10*3/uL (ref 0.0–0.7)
Eosinophils Absolute: 0.1 10*3/uL (ref 0.0–0.7)
Eosinophils Relative: 1 % (ref 0–5)
Lymphs Abs: 3.3 10*3/uL (ref 0.7–4.0)
Monocytes Absolute: 0.4 10*3/uL (ref 0.1–1.0)
Monocytes Relative: 6 % (ref 3–12)
Neutro Abs: 4.2 10*3/uL (ref 1.7–7.7)

## 2010-06-19 LAB — URINE MICROSCOPIC-ADD ON

## 2010-06-19 LAB — BASIC METABOLIC PANEL
BUN: 14 mg/dL (ref 6–23)
CO2: 27 mEq/L (ref 19–32)
Chloride: 104 mEq/L (ref 96–112)
Glucose, Bld: 95 mg/dL (ref 70–99)
Potassium: 3.8 mEq/L (ref 3.5–5.1)

## 2010-06-19 LAB — WET PREP, GENITAL
Clue Cells Wet Prep HPF POC: NONE SEEN
WBC, Wet Prep HPF POC: NONE SEEN
Yeast Wet Prep HPF POC: NONE SEEN

## 2010-06-19 NOTE — Discharge Summary (Signed)
Kara [name], Kara [name]                 ACCOUNT NO.:  [id number]  MEDICAL RECORD NO.:  [id number]           PATIENT TYPE:  I  LOCATION:  5010                         FACILITY:  [facility]  PHYSICIAN:  Rock Nephew, MD       DATE OF BIRTH:  [birth year]  DATE OF ADMISSION:  06/09/2010 DATE OF DISCHARGE:                        DISCHARGE SUMMARY - REFERRING   PRIMARY CARE PHYSICIAN:  Dr. Yehuda Mao with [name] at [facility].  DISCHARGE DIAGNOSES:  Back pain status post L4-L5 fusion by Dr. Marikay Alar on June 03, 2010, spondylosis, chronic back pain, constipation, hypertension, bacteriuria, received 3 days of Levaquin; anxiety, attention deficit hyperactivity disorder, hyperlipidemia.  DISCHARGE MEDICATIONS: 1. Fentanyl patch 75 mcg patch q.3 days. 2. Metoprolol 25 mg p.o. twice daily. 3. Oxycodone 10 mg by mouth every 6 hours as needed for pain. 4. MiraLax 17 g p.o. daily, hold for diarrhea. 5. Ambien 10 mg p.o. nightly. 6. Concerta 54 mg 1 tablet by mouth daily as needed. 7. Effexor 150 mg 2 tablets by mouth every morning. 8. Lipitor 10 mg p.o. every morning. 9. Multivitamins 1 tablet p.o. daily. 10.Multivitamins D 3000 units over-the-counter 3 tablets by mouth     every morning.  The patient's diet should be heart-healthy.  The patient's procedures performed.  The patient had lumbar spine x-ray which showed no adverse features identified, status post L4-L5 interbody fusion and X-STOP placement, consultations on this case none.  DIET:  Heart-healthy.  FOLLOWUP:  The patient should follow up with Dr. Marikay Alar within 1 week.  The patient should follow up with Dr. Yehuda Mao within 1 week.  The patient should follow up with Dr. Ethelene Hal within 1-2 weeks.  BRIEF HISTORY OF PRESENT ILLNESS:  This is a 55 year old female with a history of chronic back problems with chronic low back pain, spondylolysis, comes in with chief complaint of uncontrolled back pain. She recently had surgery on June 03, 2010.  HOSPITAL COURSE: 1. Back pain.  The patient was tried on multiple pain medications,     however the regimen that worked with the patient was a fentanyl     patch with oxycodone for breakthrough pain.  The patient briefly     also did receive some Robaxin also, the patient's pain is better     controlled with this regimen.  She was deemed ready for discharge. 2. Constipation.  The patient received daily MiraLax and constipation     has since resolved.  The patient should take MiraLax daily and hold     for diarrhea while she is taking these narcotics. 3. Hypertension.  The patient's blood pressure was currently elevated,     could be related to pain.  The patient was started on metoprolol 50     mg p.o. b.i.d.  She will be discharged on 25 mg p.o. b.i.d.  This     antihypertensives could be related to pain.  Once the pain is     better controlled, the metoprolol to possibly be discontinued. 4. Bacteriuria.  The patient had some bacteriuria.  The patient's     urine culture is not back  yet.  The patient was asymptomatic.  No     culture was done. 5. Anxiety.  The patient received anxiety medications and she was     stable.  She was also given Effexor which is her home medication. 6. Deep vein thrombosis prophylaxis.  The patient received SCDs and     she was also ambulatory.     Rock Nephew, MD     NH/MEDQ  D:  06/12/2010  T:  06/12/2010  Job:  [id number]  cc:   Dr. Vira Browns Dr. Ethelene Hal  Electronically Signed by Rock Nephew MD on 06/19/2010 09:07:30 PM

## 2010-06-20 NOTE — Discharge Summary (Signed)
  NAMEMALINI, FLEMINGS NO.:  [id number]  MEDICAL RECORD NO.:  [id number]           PATIENT TYPE:  I  LOCATION:  3033                         FACILITY:  [facility]  PHYSICIAN:  Tia Alert, MD     DATE OF BIRTH:  25-Oct-1955  DATE OF ADMISSION:  06/03/2010 DATE OF DISCHARGE:  06/04/2010                              DISCHARGE SUMMARY   ADMITTING DIAGNOSIS:  Spondylolisthesis and spondylosis, L4-5.  PROCEDURE:  XLIF L4-5.  BRIEF HISTORY OF PRESENT ILLNESS:  Ms. Mazariego is a 55 year old female who presented with back pain and leg pain related to the spondylolisthesis at L4-5.  She had a CT myelogram which showed spondylolisthesis at L4-5 with severe facet arthropathy.  She had tried medical management for quite some time without significant relief.  I recommended lumbar interbody fusion at L4-5.  She understood the risks, benefits, and expected outcome and wished to proceed.  HOSPITAL COURSE:  The patient was admitted on June 03, 2010, taken to the operating room where she underwent an anterolateral retroperitoneal interbody fusion at L4-5.  The patient tolerated the procedure well. She was taken to the recovery and then to the floor in stable condition. For details of the operative procedure, please see the dictated operative note.  The patient's hospital course was routine.  There were no complications.  She did very well following her surgery.  She states she was much better using Dilaudid for pain.  Her wound was clean, dry, and intact.  She had good strength in lower extremities.  She had appropriate postop back soreness without significant leg pain.  She was discharged home on postop day #1 with plans to follow up in 2 weeks.  FINAL DIAGNOSIS:  Anterolateral retroperitoneal interbody fusion at L4- 5.     Tia Alert, MD     DSJ/MEDQ  D:  06/17/2010  T:  06/18/2010  Job:  [id number]  Electronically Signed by Marikay Alar MD on 06/20/2010 07:36:00 AM

## 2010-06-24 ENCOUNTER — Ambulatory Visit
Admission: RE | Admit: 2010-06-24 | Discharge: 2010-06-24 | Disposition: A | Discharge status: Home / Self Care | Payer: 59 | Source: Ambulatory Visit | Attending: Neurological Surgery | Admitting: Neurological Surgery

## 2010-06-24 ENCOUNTER — Other Ambulatory Visit: Payer: Self-pay | Admitting: Neurological Surgery

## 2010-06-24 DIAGNOSIS — M47817 Spondylosis without myelopathy or radiculopathy, lumbosacral region: Secondary | ICD-10-CM

## 2010-06-24 DIAGNOSIS — M545 Low back pain, unspecified: Secondary | ICD-10-CM

## 2010-06-24 DIAGNOSIS — M4310 Spondylolisthesis, site unspecified: Secondary | ICD-10-CM

## 2010-07-05 LAB — POCT HEMOGLOBIN-HEMACUE: Hemoglobin: 14.4 g/dL (ref 12.0–15.0)

## 2010-07-15 ENCOUNTER — Other Ambulatory Visit: Payer: Self-pay | Admitting: Neurological Surgery

## 2010-07-15 ENCOUNTER — Ambulatory Visit
Admission: RE | Admit: 2010-07-15 | Discharge: 2010-07-15 | Disposition: A | Discharge status: Home / Self Care | Payer: 59 | Source: Ambulatory Visit | Attending: Neurological Surgery | Admitting: Neurological Surgery

## 2010-07-15 DIAGNOSIS — M47817 Spondylosis without myelopathy or radiculopathy, lumbosacral region: Secondary | ICD-10-CM

## 2010-07-15 DIAGNOSIS — M4310 Spondylolisthesis, site unspecified: Secondary | ICD-10-CM

## 2010-08-09 NOTE — Op Note (Signed)
NAME:  Kara [name], Kara [name]                 ACCOUNT NO.:  [id number]   MEDICAL RECORD NO.:  [id number]          PATIENT TYPE:  AMB   LOCATION:  [facility]                         FACILITY:  [facility]   PHYSICIAN:  Mark C. Vernie Ammons, M.D.  DATE OF BIRTH:  21-Feb-1956   DATE OF PROCEDURE:  07/31/2008  DATE OF DISCHARGE:                               OPERATIVE REPORT   PREOPERATIVE DIAGNOSES:  Stress urinary incontinence.   POSTOPERATIVE DIAGNOSES:  Stress urinary incontinence.   PROCEDURES PERFORMED:  Pubovaginal sling (SPARC).   SURGEON:  Mark C. Vernie Ammons, M.D.   RESIDENTGeorgeanna Lea   ANESTHESIA:  General.   DRAINS:  None.   COMPLICATIONS:  None.   ESTIMATED BLOOD LOSS:  Minimal.   INDICATIONS FOR PROCEDURE:  Patient is a 55 year old female with a  longstanding bothersome complaint of leak during physical activity,  especially running.  Patient was evaluated by Dr. Vernie Ammons in clinic, was  counseled about different treatment options.  Patient with failed  conservative management and continued bothersome symptoms, after  discussing different treatment options, opted for [facility] sling.  Risks  and benefits of the procedure were explained and informed consent  obtained.   DESCRIPTION OF PROCEDURE IN DETAIL:  Patient was brought to the  operating room, placed in supine position, administered general  anesthesia by the anesthesia team.  Proper time-out performed,  identifying correct patient, procedure and the site.  Patient was given  appropriate preoperative antibiotic.  Patient was subsequently placed in  dorsal lithotomy position.  Pressure points were well-padded.  Patient  was prepped and draped in the usual sterile manner.   We then placed a 16-French Foley catheter into patient's bladder,  drained patient's bladder.  The catheter was then plugged.  We then  injected local anesthetic into the vaginal mucosa after marking of the  mid-urethral area.  Local anesthetic with epinephrine was  used to  inject.  We then made a vertical 1.5-cm incision over the mid-urethral  area in the vaginal mucosa.  We then created vaginal mucosal flaps,  dissecting to the pubic bone on each side.  We then performed blunt  dissection with our finger until the pubic bone was felt on both sides.  We then made stab incisions in the lower part of the abdomen in the  pubic area, one on each side, just above the pubic bone.  We then used  the needle passer first on the right side of the patient and  meticulously brought it out behind the pubic bone, through the rectus  and endopelvic fascia, out through the vaginal incision.  We then loaded  one side of the synthetic mesh and pulled out the needle and the  synthetic mesh through the stab incision.  We then similarly performed  needle pass on the other side and brought it out through the vaginal  incision.  The second arm of the mesh was loaded up and brought out  through the stab incision.  We then, maintaining the midline position of  the mesh at the mid-urethral area, tightened the mesh.  We then cut the  needle from both the arms.  We then removed the plastic sheath over the  arm of the mesh on each side.  We confirmed our position of the mesh in  the mid-urethral area with appropriate tension.  We then cut the arm of  the mesh at the skin level in the pubic area.  We then irrigated the  operative field with antibiotic solution.   Of note that, after each passage of the needle, we had performed  cystourethroscopy.  After the passage of the needle on the right side,  on cystoscopy, no intravesical portion, no injury to bladder was seen.  Both the ureteric orifices were identified.  Again, after the passage of  the needle on the left side, a cystourethroscopy was performed.  Again,  ureteric orifices were identified and no bladder injury was seen.   We then closed the vaginal mucosa, using 2-0 Vicryl suture.  After the  closure there was no  active bleeding seen.  We then approximated the  pubic area stab incisions using Dermabond.  We then drained the  patient's bladder and removed the Foley catheter.  This marked the end  of the procedure.  Patient was subsequently extubated and transferred in  stable condition to the recovery room.   Of note:  Dr. Vernie Ammons was present and available for all the aspects of  the case.     ______________________________  Delma Post. Vernie Ammons, M.D.  Electronically Signed    JJ/MEDQ  D:  07/31/2008  T:  07/31/2008  Job:  [id number]

## 2010-09-12 ENCOUNTER — Ambulatory Visit
Admission: RE | Admit: 2010-09-12 | Discharge: 2010-09-12 | Disposition: A | Discharge status: Home / Self Care | Payer: 59 | Source: Ambulatory Visit | Attending: Neurological Surgery | Admitting: Neurological Surgery

## 2010-09-12 ENCOUNTER — Other Ambulatory Visit: Payer: Self-pay | Admitting: Neurological Surgery

## 2010-09-12 DIAGNOSIS — M47817 Spondylosis without myelopathy or radiculopathy, lumbosacral region: Secondary | ICD-10-CM

## 2010-09-12 DIAGNOSIS — M4310 Spondylolisthesis, site unspecified: Secondary | ICD-10-CM

## 2010-10-14 ENCOUNTER — Other Ambulatory Visit (HOSPITAL_COMMUNITY)
Admission: RE | Admit: 2010-10-14 | Discharge: 2010-10-14 | Disposition: A | Discharge status: Home / Self Care | Payer: 59 | Source: Ambulatory Visit | Attending: Family Medicine | Admitting: Family Medicine

## 2010-10-14 ENCOUNTER — Other Ambulatory Visit: Payer: Self-pay | Admitting: Family Medicine

## 2010-10-14 DIAGNOSIS — Z124 Encounter for screening for malignant neoplasm of cervix: Secondary | ICD-10-CM | POA: Insufficient documentation

## 2010-10-28 ENCOUNTER — Other Ambulatory Visit: Payer: Self-pay | Admitting: Family Medicine

## 2010-10-28 DIAGNOSIS — Z1231 Encounter for screening mammogram for malignant neoplasm of breast: Secondary | ICD-10-CM

## 2010-11-03 ENCOUNTER — Ambulatory Visit: Payer: 59

## 2010-11-14 ENCOUNTER — Ambulatory Visit
Admission: RE | Admit: 2010-11-14 | Discharge: 2010-11-14 | Disposition: A | Discharge status: Home / Self Care | Payer: 59 | Source: Ambulatory Visit | Attending: Family Medicine | Admitting: Family Medicine

## 2010-11-14 DIAGNOSIS — Z1231 Encounter for screening mammogram for malignant neoplasm of breast: Secondary | ICD-10-CM

## 2010-11-18 ENCOUNTER — Other Ambulatory Visit: Payer: Self-pay | Admitting: Family Medicine

## 2010-11-18 DIAGNOSIS — R928 Other abnormal and inconclusive findings on diagnostic imaging of breast: Secondary | ICD-10-CM

## 2010-11-24 ENCOUNTER — Other Ambulatory Visit: Payer: Self-pay | Admitting: Family Medicine

## 2010-11-24 ENCOUNTER — Ambulatory Visit
Admission: RE | Admit: 2010-11-24 | Discharge: 2010-11-24 | Disposition: A | Discharge status: Home / Self Care | Payer: 59 | Source: Ambulatory Visit | Attending: Family Medicine | Admitting: Family Medicine

## 2010-11-24 DIAGNOSIS — N63 Unspecified lump in unspecified breast: Secondary | ICD-10-CM

## 2010-11-24 DIAGNOSIS — R928 Other abnormal and inconclusive findings on diagnostic imaging of breast: Secondary | ICD-10-CM

## 2010-11-25 ENCOUNTER — Other Ambulatory Visit: Payer: 59

## 2010-12-01 ENCOUNTER — Other Ambulatory Visit: Payer: Self-pay | Admitting: Family Medicine

## 2010-12-01 ENCOUNTER — Ambulatory Visit
Admission: RE | Admit: 2010-12-01 | Discharge: 2010-12-01 | Disposition: A | Discharge status: Home / Self Care | Payer: 59 | Source: Ambulatory Visit | Attending: Family Medicine | Admitting: Family Medicine

## 2010-12-01 DIAGNOSIS — N63 Unspecified lump in unspecified breast: Secondary | ICD-10-CM

## 2010-12-08 ENCOUNTER — Observation Stay (HOSPITAL_COMMUNITY)
Admission: EM | Admit: 2010-12-08 | Discharge: 2010-12-09 | Disposition: A | Discharge status: Home / Self Care | Payer: 59 | Attending: Internal Medicine | Admitting: Internal Medicine

## 2010-12-08 ENCOUNTER — Inpatient Hospital Stay (INDEPENDENT_AMBULATORY_CARE_PROVIDER_SITE_OTHER)
Admission: RE | Admit: 2010-12-08 | Discharge: 2010-12-08 | Disposition: A | Discharge status: Home / Self Care | Payer: 59 | Source: Ambulatory Visit | Attending: Family Medicine | Admitting: Family Medicine

## 2010-12-08 ENCOUNTER — Emergency Department (HOSPITAL_COMMUNITY): Payer: 59

## 2010-12-08 DIAGNOSIS — R079 Chest pain, unspecified: Principal | ICD-10-CM | POA: Insufficient documentation

## 2010-12-08 DIAGNOSIS — M545 Low back pain, unspecified: Secondary | ICD-10-CM | POA: Insufficient documentation

## 2010-12-08 DIAGNOSIS — F411 Generalized anxiety disorder: Secondary | ICD-10-CM | POA: Insufficient documentation

## 2010-12-08 DIAGNOSIS — I059 Rheumatic mitral valve disease, unspecified: Secondary | ICD-10-CM | POA: Insufficient documentation

## 2010-12-08 DIAGNOSIS — F909 Attention-deficit hyperactivity disorder, unspecified type: Secondary | ICD-10-CM | POA: Insufficient documentation

## 2010-12-08 DIAGNOSIS — I10 Essential (primary) hypertension: Secondary | ICD-10-CM | POA: Insufficient documentation

## 2010-12-08 DIAGNOSIS — G8929 Other chronic pain: Secondary | ICD-10-CM | POA: Insufficient documentation

## 2010-12-08 LAB — DIFFERENTIAL
Eosinophils Absolute: 0.1 10*3/uL (ref 0.0–0.7)
Lymphs Abs: 2 10*3/uL (ref 0.7–4.0)
Neutro Abs: 4.7 10*3/uL (ref 1.7–7.7)
Neutrophils Relative %: 62 % (ref 43–77)

## 2010-12-08 LAB — COMPREHENSIVE METABOLIC PANEL
Albumin: 3.9 g/dL (ref 3.5–5.2)
BUN: 17 mg/dL (ref 6–23)
Creatinine, Ser: 0.56 mg/dL (ref 0.50–1.10)
Potassium: 3.9 mEq/L (ref 3.5–5.1)
Total Protein: 6.7 g/dL (ref 6.0–8.3)

## 2010-12-08 LAB — CK TOTAL AND CKMB (NOT AT ARMC)
CK, MB: 1.5 ng/mL (ref 0.3–4.0)
Total CK: 64 U/L (ref 7–177)

## 2010-12-08 LAB — POCT I-STAT TROPONIN I: Troponin i, poc: 0 ng/mL (ref 0.00–0.08)

## 2010-12-08 LAB — CBC
MCV: 83.8 fL (ref 78.0–100.0)
Platelets: 299 10*3/uL (ref 150–400)
RBC: 3.76 MIL/uL — ABNORMAL LOW (ref 3.87–5.11)
WBC: 7.5 10*3/uL (ref 4.0–10.5)

## 2010-12-08 LAB — PROTIME-INR
INR: 1.12 (ref 0.00–1.49)
Prothrombin Time: 14.6 seconds (ref 11.6–15.2)

## 2010-12-09 LAB — VITAMIN B12: Vitamin B-12: 645 pg/mL (ref 211–911)

## 2010-12-09 LAB — LIPID PANEL
Cholesterol: 212 mg/dL — ABNORMAL HIGH (ref 0–200)
Triglycerides: 242 mg/dL — ABNORMAL HIGH (ref <150)

## 2010-12-09 LAB — CARDIAC PANEL(CRET KIN+CKTOT+MB+TROPI)
Relative Index: INVALID (ref 0.0–2.5)
Relative Index: INVALID (ref 0.0–2.5)
Total CK: 55 U/L (ref 7–177)
Troponin I: <0.3 ng/mL (ref <0.30)

## 2010-12-09 LAB — IRON AND TIBC: Saturation Ratios: 17 % — ABNORMAL LOW (ref 20–55)

## 2010-12-09 LAB — PRO B NATRIURETIC PEPTIDE: Pro B Natriuretic peptide (BNP): 53 pg/mL (ref 0–125)

## 2010-12-11 NOTE — Discharge Summary (Signed)
Kara [name], ESPERICUETA NO.:  [id number]  MEDICAL RECORD NO.:  [id number]  LOCATION:  3705                         FACILITY:  [facility]  PHYSICIAN:  Jeoffrey Massed, MD    DATE OF BIRTH:  [birth year]  DATE OF ADMISSION:  12/08/2010 DATE OF DISCHARGE:  12/09/2010                        DISCHARGE SUMMARY - REFERRING   PRIMARY CARE PRACTITIONER:  Duncan Dull, MD  DISCHARGE DIAGNOSIS:  Chest pain for outpatient stress test.  SECONDARY DISCHARGE DIAGNOSES: 1. History of hypertension, but not on any medications. 2. History of anxiety disorder. 3. History of attention deficit hyperactivity disorder. 4. History of mitral valve prolapse. 5. History of chronic low back pain status post multiple surgeries.  DISCHARGE MEDICATIONS:  Include the following, 1. Aspirin 81 mg 1 tablet daily. 2. Pepcid 20 mg 1 tablet p.o. daily. 3. Adderall 5 mg 1-2 tablets daily. 4. Ambien 10 mg 1 tablet p.o. daily at bedtime p.r.n. 5. Effexor extended release 150 mg 2 capsules p.o. every morning. 6. Flexeril 10 mg 1 tablet p.o. q.6 h p.r.n. 7. Norco 10/325, 1-2 tablets p.o. q.6 h p.r.n. 8. Meloxicam 7.5 mg 2 tablets p.o. daily. 9. Vyvanse 70 mg 1 capsule p.o. daily.  CONSULTANTS:  On the case, Dr. Donato Schultz from [facility].  BRIEF HISTORY OF PRESENT ILLNESS:  The patient is a Kara [name] with the above-noted problems, was brought in complaining of chest pain. For further details, please see the history and physical that was dictated by Kara [name] on admission.  PERTINENT RADIOLOGICAL STUDIES:  X-ray of the chest done on December 08, 2010, showed no acute cardiopulmonary process or significant interval change.  2-D echocardiogram with contrast showed an EF around 60-65%, wall motion was normal.  There was no regional wall motion abnormalities.  There was grade 1 diastolic dysfunction.  LABORATORY DATA: 1. Cardiac enzymes were cycled and all the troponins were  negative. 2. LDL cholesterol was 111. 3. TSH was 1.882.  BRIEF HOSPITAL COURSE:  Chest pain.  The patient was admitted with chest pain.  She at times claimed that the chest pain was radiating upper neck into her left shoulder; however, she does have extensive back issues as well.  Given this history, the patient was seen in consult by Cardiology who did clear the patient for discharge and thought that the pain was more of atypical in nature and this could be investigated further by an outpatient stress test.  Subsequent cardiology evaluation and cardiology recommendations, the patient was discharged home for outpatient stress test.  A 2-D echocardiogram done during this hospitalization did not show any regional wall motion abnormalities and had good LV systolic function.  Her cardiac enzymes were cycled and these were negative as well.  The patient will be contacted by [facility] for an outpatient stress test.  The rest of her medical issues were stable. Please also note at the time of discharge the patient was chest pain- free.  DISPOSITION:  The patient is considered stable to be discharged home for an outpatient stress test.  FOLLOWUP INSTRUCTIONS: 1. The patient to follow up with Kara [name] within 1-2 weeks upon     discharge.  She  is to call and make an appointment. 2. The patient will be called by [facility] for an outpatient     stress test.  Total time spent for discharge 25 minutes.     Jeoffrey Massed, MD     SG/MEDQ  D:  12/09/2010  T:  12/09/2010  Job:  [id number]  cc:   Duncan Dull, M.D. Jake Bathe, MD  Electronically Signed by Jeoffrey Massed  on 12/11/2010 12:23:15 PM

## 2010-12-12 ENCOUNTER — Other Ambulatory Visit: Payer: Self-pay | Admitting: Radiology

## 2010-12-12 ENCOUNTER — Encounter: Payer: 59 | Admitting: Genetic Counselor

## 2010-12-13 LAB — HM MAMMOGRAPHY

## 2010-12-14 NOTE — Consult Note (Signed)
NAMESAVANNA, DOOLEY NO.:  [id number]  MEDICAL RECORD NO.:  [id number]  LOCATION:  3705                         FACILITY:  [facility]  PHYSICIAN:  Jake Bathe, MD      DATE OF BIRTH:  [birth year]  DATE OF CONSULTATION:  12/09/2010 DATE OF DISCHARGE:  12/09/2010                                CONSULTATION   REASON FOR CONSULTATION:  Evaluation of chest pain.  REQUESTING PHYSICIAN:  Lonia Blood, MD, [facility]  PRIMARY PHYSICIAN:  Carilyn Goodpasture, PA  HISTORY OF PRESENT ILLNESS:  A 55 year old female with a history of ADD, chronic back pain with lumbar spine surgery as well as prior history of mitral valve prolapse who came in with substernal chest pain that seemed to be persistent and not relieved by pain medication at home, which she had been taking for her back pain.  Over the past several years, she had had prior chest discomfort, sharp atypical discomfort.  This pain was rated at a moderate intensity 5/10 down to 2/10 in the emergency room and seem to be associated with some left jaw, left arm, and left neck pain.  She told me that when she squeezed her chin to the left side of her chest wall that the pain was changed and somewhat relieved.  She denied any shortness of breath, diaphoresis, syncope, palpitations, fevers, chills, nausea, vomiting.  Currently, she is resting comfortably in bed, working on her laptop in no distress.  PAST MEDICAL HISTORY:  L4-L5 disease, status post multiple surgeries, last one in March 2012 with fusion of L4-L5.  She has also got anxiety disorder, depression, attention deficit hyperactivity disorder, mitral valve prolapse history in the past diagnosed and a history of nephrolithiasis.  PAST SURGICAL HISTORY:  Tubal ligation, tonsillectomy, and L4-L5 fusion.  ALLERGIES:  She is allergic to ASPIRIN, NSAIDS, COX-2 INHIBITORS, KETOROLAC; all cause anaphylaxis.  TORADOL may cause anaphylaxis also.  MEDICATIONS:   Flexeril, meloxicam, Adderall, Vyvanse, hydrocodone, Effexor and Ambien.  SOCIAL HISTORY:  She lives here in [place].  No tobacco use.  No alcohol use.  No IV drug use.  She works in the [employer] with the [employer], which goes to homes to try to prevent recurrent hospitalizations to help assist those in need.  FAMILY HISTORY:  Father had open heart surgery in his 39s due to coronary artery disease.  This was diagnosed by Dr. Charlies Constable. Mother had arrhythmias and a history of cancer.  REVIEW OF SYSTEMS:  Unless specified above, all other 12-review of systems negative.  PHYSICAL EXAMINATION:  VITAL SIGNS:  Temperature 98.5, blood pressure currently 98/63, pulse 75, respiration 19, satting 93% on room air. GENERAL:  Alert and oriented x3 in no acute distress, comfortable in bed. EYES:  Well-perfused conjunctivae.  EOMI.  No scleral icterus. NECK:  Supple.  No lymphadenopathy.  No thyromegaly.  No carotid bruits. No JVD. CHEST:  Chest wall nontender to palpation.  No rashes noted. CARDIOVASCULAR:  Regular rate and rhythm without any appreciable murmurs, rubs, or gallops. LUNGS:  Clear to auscultation bilaterally.  Normal respiratory effort. No wheezes.  No rales. ABDOMEN:  Soft, nontender.  Normoactive bowel  sounds.  No rebound.  No guarding. EXTREMITIES:  No clubbing, cyanosis, or edema.  Normal distal pulses. GU:  Deferred. RECTAL:  Deferred. NEURO:  Nonfocal.  Cranial nerves II through XII seemed grossly intact.  LABORATORY DATA:  TSH is normal.  Cardiac markers are normal.  BNP is normal.  LDL cholesterol was 111, HDL 53.  Creatinine is 0.5 with liver functions normal.  Chest x-ray personally viewed shows granuloma of left lung, minor bronchitic changes, but no acute abnormalities.  No cardiomegaly.  Echocardiogram demonstrates normal ejection fraction without any other abnormalities.  EKG personally viewed shows sinus rhythm, poor R-wave progression, and  sinus rhythm with subtle J-point elevation, otherwise normal.  ASSESSMENT AND PLAN:  A 55 year old female with atypical chest pain, attention deficit disorder, and lumbar spine surgery/back pain/anxiety. 1. Chest pain - cardiac markers are all reassuring.  EKG is reassuring     showing no evidence of ischemia.  Her chest pain does seem to be     more musculoskeletal in etiology given that she does have changes     in position, especially when she tilts her neck to the left or     squeezes it towards her chest wall.  She did state that she had the     discomfort after shopping at [facility] and it occurred when she was at     home.  It was moderate in intensity and had sharpness to it in     character.  Did not seem to be exacerbated by activity.  Currently,     she is feeling back to her normal state of health.  Given her     father's coronary artery disease history, I will proceed with     outpatient stress test which we will get set up through our office.     This will provide further risk stratification and try to elucidate     any evidence of ischemia.  Most likely, her chest discomfort is     musculoskeletal in etiology.  She knows to contact us if any     worrisome symptoms develop. 2. Anxiety - continuing with Effexor. 3. Attention deficit disorder.  She is on Vyvanse and Adderall. 4. Chronic back pain.  She is on both Flexeril and meloxicam and     hydrocodone p.r.n..  We will see her during stress test in close     followup.     Jake Bathe, MD     MCS/MEDQ  D:  12/09/2010  T:  12/10/2010  Job:  [id number]  cc:   Carilyn Goodpasture, PA Lonia Blood, M.D.  Electronically Signed by Donato Schultz MD on 12/14/2010 78:29:56 AM

## 2010-12-20 ENCOUNTER — Other Ambulatory Visit: Payer: Self-pay | Admitting: Rheumatology

## 2010-12-20 DIAGNOSIS — M064 Inflammatory polyarthropathy: Secondary | ICD-10-CM

## 2010-12-21 ENCOUNTER — Inpatient Hospital Stay: Admission: RE | Admit: 2010-12-21 | Payer: 59 | Source: Ambulatory Visit

## 2010-12-26 ENCOUNTER — Other Ambulatory Visit: Payer: Self-pay | Admitting: Neurological Surgery

## 2010-12-26 ENCOUNTER — Ambulatory Visit
Admission: RE | Admit: 2010-12-26 | Discharge: 2010-12-26 | Disposition: A | Discharge status: Home / Self Care | Payer: 59 | Source: Ambulatory Visit | Attending: Neurological Surgery | Admitting: Neurological Surgery

## 2010-12-26 DIAGNOSIS — M953 Acquired deformity of neck: Secondary | ICD-10-CM

## 2010-12-26 DIAGNOSIS — M545 Low back pain, unspecified: Secondary | ICD-10-CM

## 2010-12-26 DIAGNOSIS — M79609 Pain in unspecified limb: Secondary | ICD-10-CM

## 2010-12-26 DIAGNOSIS — M4310 Spondylolisthesis, site unspecified: Secondary | ICD-10-CM

## 2010-12-27 ENCOUNTER — Ambulatory Visit
Admission: RE | Admit: 2010-12-27 | Discharge: 2010-12-27 | Disposition: A | Discharge status: Home / Self Care | Payer: 59 | Source: Ambulatory Visit | Attending: Rheumatology | Admitting: Rheumatology

## 2010-12-27 DIAGNOSIS — M064 Inflammatory polyarthropathy: Secondary | ICD-10-CM

## 2010-12-27 MED ORDER — GADOBENATE DIMEGLUMINE 529 MG/ML IV SOLN
15.0000 mL | Freq: Once | INTRAVENOUS | Status: AC | PRN
  Administered 2010-12-27: 15 mL via INTRAVENOUS

## 2011-01-07 NOTE — H&P (Signed)
Kara [name], CRISP NO.:  [id number]  MEDICAL RECORD NO.:  [id number]  LOCATION:  [facility]                         FACILITY:  [facility]  PHYSICIAN:  Lonia Blood, M.D.      DATE OF BIRTH:  1955-11-15  DATE OF ADMISSION:  12/08/2010 DATE OF DISCHARGE:                             HISTORY & PHYSICAL   PRIMARY CARE PHYSICIAN:  Carilyn Goodpasture, PA  PRESENTING COMPLAINT:  Chest pain.  HISTORY OF PRESENT ILLNESS:  The patient is a 55 year old female with history of ADD and chronic back pain, who has also mitral valve prolapse.  She came in, presenting with substernal chest pain that started today.  It has been persistent and not relieved by her pain medicine at home.  She has had prior chest pains, but usually did not last this long.  The pain is rated as 5/10, down to 2/10 at the moment. It radiates to her left jaw and left arm.  No known aggravating factor at this point, however, persistent, only relieved with some nitroglycerin in the ED.  She denied any shortness of breath.  No cough, no fever, no PND, no orthopnea, no leg swelling.  PAST MEDICAL HISTORY:  Significant for L4-L5 disk disease status post multiple surgeries, last was in March 2012, with fusion of L4-L5, history of anxiety disorder, depression, attention deficit hyperactivity disorder, mitral valve prolapse, history of kidney stones.  PAST SURGICAL HISTORY:  Status post tubal ligation, status post tonsillectomy, status post recent vertebral L4-L5 fusion.  ALLERGIES:  To multiple medications include ASPIRIN, NSAIDS with COX-2 INHIBITORS,  KETOROLAC, ISUPREL, all cause anaphylaxis.  Also, TORADOL that also causes anaphylaxis.  MEDICATIONS: 1. Flexeril 10 mg q.6 hours p.r.n. 2. Meloxicam 7.5 mg 2 tablets daily. 3. Adderall 5 mg 1-2 tablets daily. 4. Vyvanse 70 mg daily. 5. Hydrocodone/acetaminophen 1-2 tablets q.6 hours p.r.n. 10/325 mg. 6. Effexor XR 150 mg 2 caps every morning. 7. Ambien 10 mg at  bedtime p.r.n.  SOCIAL HISTORY:  The patient lives in [place].  She denied tobacco use.  No alcohol.  No IV drug use.  FAMILY HISTORY:  Significant for her father having open heart surgery in his 21s.  Her mother had some arrhythmias, also history of cancer in the family.  REVIEW OF SYSTEMS:  All systems reviewed and are negative except per HPI.  PHYSICAL EXAMINATION:  VITAL SIGNS:  Temperature 98.5, blood pressure 145/79, pulse is 70, respiratory rate 16, her sats 98% on room air. GENERAL:  She is awake, alert, oriented.  She is in no acute distress. HEENT:  PERRL.  EOMI.  No pallor, no jaundice.  No rhinorrhea. NECK:  Supple.  No visible JVD.  No lymphadenopathy. RESPIRATORY:  She has good air entry bilaterally.  No wheezes, no rales. CARDIOVASCULAR SYSTEM:  She has S1 and S2.  No murmur. ABDOMEN:  Soft, full, nontender with positive bowel sounds. EXTREMITIES:  No edema, cyanosis, or clubbing. SKIN:  No significant rashes or ulcers.  LABS:  She has a white count of 7.5, hemoglobin of 10.5 with an MCV of 83, platelet count 299.  Initial cardiac enzymes are negative.  PT 14.6, INR 1.12.  Sodium is  136, potassium 3.9, chloride 102, CO2 of 27, glucose 88, BUN 17, creatinine 0.56, calcium 9.0.  LFTs are within normal.  Chest x-ray showed no acute findings.  There is stable granuloma of the left lung and mild infrahilar chronic bronchitic change.  Her EKG showed normal sinus rhythm with a rate of 88, normal interval with no significant ST-T wave changes.  ASSESSMENT:  This is a 55 year old female presenting with left-sided chest pain which is somewhat persisted at this point.  More than likely this is atypical, but again with chest pain radiating to her left jaw and left arm and family history, we will consider this as significant.  PLAN: 1. Chest pain.  Admit the patient for observation and rule out MI.     Check serial cardiac enzymes and put on some nitro paste.  Watch      her blood pressure closely.  The patient may require stress test at     the end of the hospitalization.  I will check a 2D echo with her     prior history of mitral valve prolapse. 2. Degenerative disk disease.  Continue her home medications. 3. Anxiety disorder.  Again, continue with her home medicine at this     point. 4. Depression.  She is on Effexor, we will continue with that. 5. Anemia, this is normocytic.  I will check anemia panel and follow     her H and H closely.     Lonia Blood, M.D.     Verlin Grills  D:  12/09/2010  T:  12/09/2010  Job:  [id number]  Electronically Signed by Lonia Blood M.D. on 01/07/2011 03:07:57 PM

## 2011-05-17 ENCOUNTER — Encounter: Payer: Self-pay | Admitting: *Deleted

## 2011-05-17 NOTE — Progress Notes (Signed)
Mailed letter to pt about [facility]. 

## 2011-06-29 ENCOUNTER — Encounter (HOSPITAL_COMMUNITY): Payer: Self-pay | Admitting: Emergency Medicine

## 2011-06-29 ENCOUNTER — Emergency Department (HOSPITAL_COMMUNITY)
Admission: EM | Admit: 2011-06-29 | Discharge: 2011-06-29 | Disposition: A | Discharge status: Home / Self Care | Payer: 59 | Attending: Emergency Medicine | Admitting: Emergency Medicine

## 2011-06-29 ENCOUNTER — Other Ambulatory Visit: Payer: Self-pay

## 2011-06-29 DIAGNOSIS — R002 Palpitations: Secondary | ICD-10-CM | POA: Insufficient documentation

## 2011-06-29 DIAGNOSIS — M25519 Pain in unspecified shoulder: Secondary | ICD-10-CM

## 2011-06-29 DIAGNOSIS — R4589 Other symptoms and signs involving emotional state: Secondary | ICD-10-CM | POA: Insufficient documentation

## 2011-06-29 HISTORY — DX: Fibromyalgia: M79.7

## 2011-06-29 LAB — DIFFERENTIAL
Lymphs Abs: 2.1 10*3/uL (ref 0.7–4.0)
Monocytes Relative: 7 % (ref 3–12)
Neutro Abs: 5 10*3/uL (ref 1.7–7.7)
Neutrophils Relative %: 64 % (ref 43–77)

## 2011-06-29 LAB — CBC
HCT: 40.4 % (ref 36.0–46.0)
Hemoglobin: 13.1 g/dL (ref 12.0–15.0)
MCH: 27.5 pg (ref 26.0–34.0)
RBC: 4.77 MIL/uL (ref 3.87–5.11)

## 2011-06-29 LAB — BASIC METABOLIC PANEL
BUN: 16 mg/dL (ref 6–23)
Chloride: 100 mEq/L (ref 96–112)
Glucose, Bld: 93 mg/dL (ref 70–99)
Potassium: 3.6 mEq/L (ref 3.5–5.1)

## 2011-06-29 MED ORDER — OXYCODONE-ACETAMINOPHEN 5-325 MG PO TABS
1.0000 | ORAL_TABLET | Freq: Once | ORAL | Status: AC
  Administered 2011-06-29: 1 via ORAL
  Filled 2011-06-29: qty 1

## 2011-06-29 MED ORDER — OXYCODONE-ACETAMINOPHEN 5-325 MG PO TABS: 1.0000 | ORAL_TABLET | ORAL | Status: AC | PRN

## 2011-06-29 NOTE — ED Notes (Signed)
PT. 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

## 2011-06-29 NOTE — Discharge Instructions (Signed)
Rotator Cuff Injury The rotator cuff is the collective set of muscles and tendons that make up the stabilizing unit of your shoulder. This unit holds in the ball of the humerus (upper arm bone) in the socket of the scapula (shoulder blade). Injuries to this stabilizing unit most commonly come from sports or activities that cause the arm to be moved repeatedly over the head. Examples of this include throwing, weight lifting, swimming, racquet sports, or an injury such as falling on your arm. Chronic (longstanding) irritation of this unit can cause inflammation (soreness), bursitis, and eventual damage to the tendons to the point of rupture (tear). An acute (sudden) injury of the rotator cuff can result in a partial or complete tear. You may need surgery with complete tears. Small or partial rotator cuff tears may be treated conservatively with temporary immobilization, exercises and rest. Physical therapy may be needed. HOME CARE INSTRUCTIONS   Apply ice to the injury for 15 to 20 minutes 3 to 4 times per day for the first 2 days. Put the ice in a plastic bag and place a towel between the bag of ice and your skin.   If you have a shoulder immobilizer (sling and straps), do not remove it for as long as directed by your caregiver or until you see a caregiver for a follow-up examination. If you need to remove it, move your arm as little as possible.   You may want to sleep on several pillows or in a recliner at night to lessen swelling and pain.   Only take over-the-counter or prescription medicines for pain, discomfort, or fever as directed by your caregiver.   Do simple hand squeezing exercises with a soft rubber ball to decrease hand swelling.  SEEK MEDICAL CARE IF:   Pain in your shoulder increases or new pain or numbness develops in your arm, hand, or fingers.   Your hand or fingers are colder than your other hand.  SEEK IMMEDIATE MEDICAL CARE IF:   Your arm, hand, or fingers are numb or  tingling.   Your arm, hand, or fingers are increasingly swollen and painful, or turn white or blue.  Document Released: 03/10/2000 Document Revised: 03/02/2011 Document Reviewed: 03/03/2008 [facility] Patient Information 2012 [place], [place].

## 2011-06-29 NOTE — ED Provider Notes (Signed)
History     CSN: [id number]  Arrival date & time 06/29/11  Barry Brunner   First MD Initiated Contact with Patient 06/29/11 2218      Chief Complaint  Patient presents with  . Palpitations    (Consider location/radiation/quality/duration/timing/severity/associated sxs/prior treatment) Patient is a 56 y.o. female presenting with palpitations. The history is provided by the patient.  Palpitations  This is a chronic problem. Pertinent negatives include no fever, no chest pain, no abdominal pain, no nausea and no vomiting.  Pt diagnosed with R rotator cuff injury last year and had repair in Dec at [facility]. Recent MRI showed worsening injury per pt. Pt has had increasing pain in that shoulder requiring more pain meds. She sees a chronic pain MD and has been unable to get her meds refilled. She is scheduled to have surgery again in May but does not think she can wait that long. Would like referral for second opinion. No recent changes in symptoms. No weakness, numbness. No CP or palpitations.   Past Medical History  Diagnosis Date  . Arthritis   . Fibromyalgia     Past Surgical History  Procedure Date  . Shoulder surgery     No family history on file.  History  Substance Use Topics  . Smoking status: Never Smoker   . Smokeless tobacco: Not on file  . Alcohol Use: No    OB History    Grav Para Term Preterm Abortions TAB SAB Ect Mult Living                  Review of Systems  Constitutional: Negative for fever and chills.  HENT: Negative for neck pain.   Cardiovascular: Negative for chest pain and palpitations.  Gastrointestinal: Negative for nausea, vomiting and abdominal pain.  Musculoskeletal: Positive for arthralgias.    Allergies  Aspirin; Ibuprofen; and Tramadol hcl  Home Medications   Current Outpatient Rx  Name Route Sig Dispense Refill  . CLONAZEPAM 0.5 MG PO TABS Oral Take 0.5 mg by mouth 3 (three) times daily as needed. For anxiety.    [name] FOLIC ACID 1 MG  PO TABS Oral Take 2 mg by mouth daily.    . MELOXICAM 15 MG PO TABS Oral Take 15 mg by mouth daily.    [name] METHOTREXATE SODIUM (PF) 50 MG/2ML IJ SOLN Injection Inject 10 mg as directed once a week. On Wednesdays. 0.4 ML = 10 MG    . VENLAFAXINE HCL 100 MG PO TABS Oral Take 300 mg by mouth 2 (two) times daily.    [name] ZOLPIDEM TARTRATE 10 MG PO TABS Oral Take 10 mg by mouth at bedtime as needed. For sleep.    . OXYCODONE-ACETAMINOPHEN 5-325 MG PO TABS Oral Take 1 tablet by mouth every 4 (four) hours as needed for pain. 10 tablet 0    BP 126/90  Temp(Src) 98.6 F (37 C) (Oral)  Resp 18  SpO2 99%  Physical Exam  Nursing note and vitals reviewed. Constitutional: She is oriented to person, place, and time. She appears well-developed and well-nourished.  HENT:  Head: Normocephalic and atraumatic.  Eyes: Pupils are equal, round, and reactive to light.  Neck: Normal range of motion.  Cardiovascular: Normal rate and regular rhythm.   Pulmonary/Chest: Effort normal and breath sounds normal.  Abdominal: Soft.  Musculoskeletal: Normal range of motion. She exhibits tenderness. She exhibits no edema.       Pt freely moving R shoulder with no restriction due to pain. Mod ttp  superior to R shoulder joint. No laxity or swelling appreciated.   Neurological: She is alert and oriented to person, place, and time.       5/5 strength, sensation intact  Skin: Skin is dry.  Psychiatric: She has a normal mood and affect.       Pt becoming frequently tearful and angry    ED Course  Procedures (including critical care time)   Labs Reviewed  CBC  DIFFERENTIAL  BASIC METABOLIC PANEL  POCT I-STAT TROPONIN I   No results found.   1. Shoulder pain       MDM  Will give 10 percocet until pt can f/u with chronic pain MD. Have given referral to [facility]. Offered Shoulder x-ray, sling for comfort but pt refused.         Loren Racer, MD 06/29/11 2251

## 2011-07-19 ENCOUNTER — Encounter (HOSPITAL_COMMUNITY): Payer: Self-pay | Admitting: Pharmacy Technician

## 2011-07-24 ENCOUNTER — Other Ambulatory Visit: Payer: Self-pay | Admitting: Orthopedic Surgery

## 2011-07-25 ENCOUNTER — Encounter (HOSPITAL_COMMUNITY): Payer: Self-pay

## 2011-07-25 ENCOUNTER — Encounter (HOSPITAL_COMMUNITY)
Admission: RE | Admit: 2011-07-25 | Discharge: 2011-07-25 | Disposition: A | Discharge status: Home / Self Care | Payer: 59 | Source: Ambulatory Visit | Attending: Orthopedic Surgery | Admitting: Orthopedic Surgery

## 2011-07-25 HISTORY — DX: Depression, unspecified: F32.A

## 2011-07-25 HISTORY — DX: Other psychoactive substance use, unspecified with intoxication with delirium: F19.921

## 2011-07-25 HISTORY — DX: Adverse effect of unspecified drugs, medicaments and biological substances, initial encounter: T50.905A

## 2011-07-25 HISTORY — DX: Major depressive disorder, single episode, unspecified: F32.9

## 2011-07-25 HISTORY — DX: Disorientation, unspecified: R41.0

## 2011-07-25 HISTORY — DX: Postnasal drip: R09.82

## 2011-07-25 LAB — CBC
HCT: 40.8 % (ref 36.0–46.0)
Hemoglobin: 13.3 g/dL (ref 12.0–15.0)
WBC: 6.3 10*3/uL (ref 4.0–10.5)

## 2011-07-25 LAB — URINE MICROSCOPIC-ADD ON

## 2011-07-25 LAB — URINALYSIS, ROUTINE W REFLEX MICROSCOPIC
Glucose, UA: NEGATIVE mg/dL
Ketones, ur: NEGATIVE mg/dL
Protein, ur: NEGATIVE mg/dL

## 2011-07-25 LAB — BASIC METABOLIC PANEL
CO2: 27 mEq/L (ref 19–32)
Chloride: 99 mEq/L (ref 96–112)
Glucose, Bld: 90 mg/dL (ref 70–99)
Potassium: 4.6 mEq/L (ref 3.5–5.1)
Sodium: 137 mEq/L (ref 135–145)

## 2011-07-25 LAB — PROTIME-INR: INR: 1.02 (ref 0.00–1.49)

## 2011-07-25 LAB — TYPE AND SCREEN: ABO/RH(D): O NEG

## 2011-07-25 LAB — C-REACTIVE PROTEIN: CRP: 0.32 mg/dL — ABNORMAL LOW (ref <0.60)

## 2011-07-25 NOTE — Pre-Procedure Instructions (Signed)
20 Kara [name]  07/25/2011   Your procedure is scheduled on:  Aug 01, 2011 @ 1135  Report to [facility] at 0935 AM.  Call this number if you have problems the morning of surgery: [phone]   Remember:   Do not eat food:After Midnight.  May have clear liquids: up to 4 Hours before arrival.  Clear liquids include soda, tea, black coffee, apple or grape juice, broth.  Take these medicines the morning of surgery with A SIP OF WATER: Adderall, Klonopin (if needed),   paxil, effexor, oxycontin (if needed).   Do not wear jewelry, make-up or nail polish.  Do not wear lotions, powders, or perfumes.  Do not shave 48 hours prior to surgery.  Do not bring valuables to the hospital.  Contacts, dentures or bridgework may not be worn into surgery.  Leave suitcase in the car. After surgery it may be brought to your room.  For patients admitted to the hospital, checkout time is 11:00 AM the day of discharge.   Patients discharged the day of surgery will not be allowed to drive home.  Special Instructions: CHG Shower Use Special Wash: 1/2 bottle night before surgery and 1/2 bottle morning of surgery.   Please read over the following fact sheets that you were given: Pain Booklet, Coughing and Deep Breathing, Blood Transfusion Information, MRSA Information and Surgical Site Infection Prevention

## 2011-07-25 NOTE — Progress Notes (Signed)
Primary Physician - Dr. Yehuda Mao - [facility]  Patient does not have cardiologist  Echo 2012 in epic, stress test > 20 years ago, no cardiac cath

## 2011-07-31 MED ORDER — CEFAZOLIN SODIUM 1-5 GM-% IV SOLN
1.0000 g | INTRAVENOUS | Status: DC
  Filled 2011-07-31: qty 50

## 2011-08-01 ENCOUNTER — Encounter (HOSPITAL_COMMUNITY): Payer: Self-pay | Admitting: *Deleted

## 2011-08-01 ENCOUNTER — Inpatient Hospital Stay (HOSPITAL_COMMUNITY): Payer: 59

## 2011-08-01 ENCOUNTER — Inpatient Hospital Stay (HOSPITAL_COMMUNITY)
Admission: RE | Admit: 2011-08-01 | Discharge: 2011-08-04 | DRG: 484 | Disposition: A | Discharge status: Home / Self Care | Payer: 59 | Source: Ambulatory Visit | Attending: Orthopedic Surgery | Admitting: Orthopedic Surgery

## 2011-08-01 ENCOUNTER — Encounter (HOSPITAL_COMMUNITY): Payer: Self-pay | Admitting: Anesthesiology

## 2011-08-01 ENCOUNTER — Encounter (HOSPITAL_COMMUNITY)
Admission: RE | Disposition: A | Discharge status: Home / Self Care | Payer: Self-pay | Source: Ambulatory Visit | Attending: Orthopedic Surgery

## 2011-08-01 ENCOUNTER — Ambulatory Visit (HOSPITAL_COMMUNITY): Payer: 59 | Admitting: Anesthesiology

## 2011-08-01 ENCOUNTER — Encounter (HOSPITAL_COMMUNITY): Payer: Self-pay | Admitting: Orthopedic Surgery

## 2011-08-01 DIAGNOSIS — M069 Rheumatoid arthritis, unspecified: Secondary | ICD-10-CM | POA: Diagnosis present

## 2011-08-01 DIAGNOSIS — F329 Major depressive disorder, single episode, unspecified: Secondary | ICD-10-CM | POA: Diagnosis present

## 2011-08-01 DIAGNOSIS — S43429A Sprain of unspecified rotator cuff capsule, initial encounter: Secondary | ICD-10-CM | POA: Diagnosis present

## 2011-08-01 DIAGNOSIS — M06811 Other specified rheumatoid arthritis, right shoulder: Secondary | ICD-10-CM | POA: Diagnosis present

## 2011-08-01 DIAGNOSIS — IMO0001 Reserved for inherently not codable concepts without codable children: Secondary | ICD-10-CM | POA: Diagnosis present

## 2011-08-01 DIAGNOSIS — F3289 Other specified depressive episodes: Secondary | ICD-10-CM | POA: Diagnosis present

## 2011-08-01 DIAGNOSIS — F1210 Cannabis abuse, uncomplicated: Secondary | ICD-10-CM | POA: Diagnosis present

## 2011-08-01 DIAGNOSIS — Z9851 Tubal ligation status: Secondary | ICD-10-CM

## 2011-08-01 DIAGNOSIS — Z886 Allergy status to analgesic agent status: Secondary | ICD-10-CM

## 2011-08-01 DIAGNOSIS — Z9089 Acquired absence of other organs: Secondary | ICD-10-CM

## 2011-08-01 DIAGNOSIS — M8708 Idiopathic aseptic necrosis of bone, other site: Principal | ICD-10-CM | POA: Diagnosis present

## 2011-08-01 DIAGNOSIS — Z79899 Other long term (current) drug therapy: Secondary | ICD-10-CM

## 2011-08-01 DIAGNOSIS — Z885 Allergy status to narcotic agent status: Secondary | ICD-10-CM

## 2011-08-01 HISTORY — DX: Other specified rheumatoid arthritis, right shoulder: M06.811

## 2011-08-01 HISTORY — PX: TOTAL SHOULDER ARTHROPLASTY: SHX126

## 2011-08-01 SURGERY — ARTHROPLASTY, SHOULDER, TOTAL
Anesthesia: General | Site: Shoulder | Laterality: Right | Wound class: Clean

## 2011-08-01 MED ORDER — ONDANSETRON HCL 4 MG/2ML IJ SOLN
INTRAMUSCULAR | Status: DC | PRN
  Administered 2011-08-01: 4 mg via INTRAVENOUS

## 2011-08-01 MED ORDER — ONDANSETRON HCL 4 MG/2ML IJ SOLN: 4.0000 mg | Freq: Four times a day (QID) | INTRAMUSCULAR | Status: DC | PRN

## 2011-08-01 MED ORDER — PHENOL 1.4 % MT LIQD: 1.0000 | OROMUCOSAL | Status: DC | PRN

## 2011-08-01 MED ORDER — OXYCODONE HCL 5 MG PO TABS
5.0000 mg | ORAL_TABLET | ORAL | Status: DC | PRN
  Administered 2011-08-01 – 2011-08-03 (×4): 10 mg via ORAL
  Administered 2011-08-03: 5 mg via ORAL
  Filled 2011-08-01 (×3): qty 2
  Filled 2011-08-01: qty 1
  Filled 2011-08-01: qty 2

## 2011-08-01 MED ORDER — FENTANYL CITRATE 0.05 MG/ML IJ SOLN
50.0000 ug | INTRAMUSCULAR | Status: DC | PRN
  Administered 2011-08-01: 75 ug via INTRAVENOUS
  Administered 2011-08-01: 25 ug via INTRAVENOUS

## 2011-08-01 MED ORDER — FENTANYL CITRATE 0.05 MG/ML IJ SOLN
INTRAMUSCULAR | Status: DC | PRN
  Administered 2011-08-01: 75 ug via INTRAVENOUS
  Administered 2011-08-01: 50 ug via INTRAVENOUS
  Administered 2011-08-01: 25 ug via INTRAVENOUS
  Administered 2011-08-01: 100 ug via INTRAVENOUS

## 2011-08-01 MED ORDER — ZOLPIDEM TARTRATE 5 MG PO TABS: 5.0000 mg | ORAL_TABLET | Freq: Every evening | ORAL | Status: DC | PRN

## 2011-08-01 MED ORDER — ONDANSETRON HCL 4 MG/2ML IJ SOLN: 4.0000 mg | Freq: Once | INTRAMUSCULAR | Status: DC | PRN

## 2011-08-01 MED ORDER — LACTATED RINGERS IV SOLN
INTRAVENOUS | Status: DC | PRN
  Administered 2011-08-01 (×2): via INTRAVENOUS

## 2011-08-01 MED ORDER — DOCUSATE SODIUM 100 MG PO CAPS
100.0000 mg | ORAL_CAPSULE | Freq: Two times a day (BID) | ORAL | Status: DC
  Administered 2011-08-01 – 2011-08-04 (×6): 100 mg via ORAL
  Filled 2011-08-01 (×7): qty 1

## 2011-08-01 MED ORDER — DIAZEPAM 5 MG PO TABS
5.0000 mg | ORAL_TABLET | Freq: Four times a day (QID) | ORAL | Status: DC | PRN
  Administered 2011-08-01: 5 mg via ORAL
  Filled 2011-08-01: qty 1

## 2011-08-01 MED ORDER — METOCLOPRAMIDE HCL 5 MG/ML IJ SOLN: 5.0000 mg | Freq: Three times a day (TID) | INTRAMUSCULAR | Status: DC | PRN

## 2011-08-01 MED ORDER — SENNA 8.6 MG PO TABS
1.0000 | ORAL_TABLET | Freq: Two times a day (BID) | ORAL | Status: DC
  Administered 2011-08-01 – 2011-08-04 (×6): 8.6 mg via ORAL
  Filled 2011-08-01 (×7): qty 1

## 2011-08-01 MED ORDER — POTASSIUM CHLORIDE IN NACL 20-0.45 MEQ/L-% IV SOLN
INTRAVENOUS | Status: DC
  Administered 2011-08-01 – 2011-08-02 (×2): via INTRAVENOUS
  Administered 2011-08-03: 75 mL/h via INTRAVENOUS
  Filled 2011-08-01 (×5): qty 1000

## 2011-08-01 MED ORDER — AMPHETAMINE-DEXTROAMPHETAMINE 30 MG PO TABS: 60.0000 mg | ORAL_TABLET | Freq: Every day | ORAL | Status: DC

## 2011-08-01 MED ORDER — LACTATED RINGERS IV SOLN
INTRAVENOUS | Status: DC
  Administered 2011-08-01: 10:00:00 via INTRAVENOUS

## 2011-08-01 MED ORDER — CEFAZOLIN SODIUM 1-5 GM-% IV SOLN
1.0000 g | Freq: Four times a day (QID) | INTRAVENOUS | Status: AC
  Administered 2011-08-01 – 2011-08-02 (×3): 1 g via INTRAVENOUS
  Filled 2011-08-01 (×3): qty 50

## 2011-08-01 MED ORDER — CEFAZOLIN SODIUM 1-5 GM-% IV SOLN
INTRAVENOUS | Status: DC | PRN
  Administered 2011-08-01: 1 g via INTRAVENOUS

## 2011-08-01 MED ORDER — CLONAZEPAM 0.5 MG PO TABS
0.5000 mg | ORAL_TABLET | Freq: Three times a day (TID) | ORAL | Status: DC | PRN
  Administered 2011-08-01 – 2011-08-03 (×4): 0.5 mg via ORAL
  Filled 2011-08-01 (×4): qty 1

## 2011-08-01 MED ORDER — OXYCODONE-ACETAMINOPHEN 5-325 MG PO TABS
1.0000 | ORAL_TABLET | ORAL | Status: DC | PRN
  Administered 2011-08-01 – 2011-08-03 (×6): 2 via ORAL
  Administered 2011-08-04: 1 via ORAL
  Filled 2011-08-01 (×7): qty 2

## 2011-08-01 MED ORDER — PROMETHAZINE HCL 25 MG PO TABS: 25.0000 mg | ORAL_TABLET | Freq: Four times a day (QID) | ORAL | Status: DC | PRN

## 2011-08-01 MED ORDER — CYCLOBENZAPRINE HCL 10 MG PO TABS
10.0000 mg | ORAL_TABLET | Freq: Three times a day (TID) | ORAL | Status: DC | PRN
  Administered 2011-08-01 – 2011-08-03 (×4): 10 mg via ORAL
  Filled 2011-08-01 (×4): qty 1

## 2011-08-01 MED ORDER — ACETAMINOPHEN 325 MG PO TABS: 650.0000 mg | ORAL_TABLET | Freq: Four times a day (QID) | ORAL | Status: DC | PRN

## 2011-08-01 MED ORDER — SODIUM CHLORIDE 0.9 % IR SOLN
Status: DC | PRN
  Administered 2011-08-01: 1000 mL

## 2011-08-01 MED ORDER — ROCURONIUM BROMIDE 100 MG/10ML IV SOLN
INTRAVENOUS | Status: DC | PRN
  Administered 2011-08-01: 25 mg via INTRAVENOUS
  Administered 2011-08-01: 10 mg via INTRAVENOUS
  Administered 2011-08-01: 15 mg via INTRAVENOUS

## 2011-08-01 MED ORDER — LIDOCAINE HCL 4 % MT SOLN
OROMUCOSAL | Status: DC | PRN
  Administered 2011-08-01: 4 mL via TOPICAL

## 2011-08-01 MED ORDER — HYDROMORPHONE HCL PF 1 MG/ML IJ SOLN
0.5000 mg | INTRAMUSCULAR | Status: DC | PRN
  Administered 2011-08-01 – 2011-08-02 (×9): 1 mg via INTRAVENOUS
  Filled 2011-08-01 (×9): qty 1

## 2011-08-01 MED ORDER — VENLAFAXINE HCL ER 150 MG PO CP24: 150.0000 mg | ORAL_CAPSULE | Freq: Two times a day (BID) | ORAL | Status: DC

## 2011-08-01 MED ORDER — MENTHOL 3 MG MT LOZG: 1.0000 | LOZENGE | OROMUCOSAL | Status: DC | PRN

## 2011-08-01 MED ORDER — FLUOXETINE HCL 20 MG PO CAPS
20.0000 mg | ORAL_CAPSULE | Freq: Every day | ORAL | Status: DC
  Administered 2011-08-01 – 2011-08-04 (×4): 20 mg via ORAL
  Filled 2011-08-01 (×4): qty 1

## 2011-08-01 MED ORDER — DIPHENHYDRAMINE HCL 12.5 MG/5ML PO ELIX: 12.5000 mg | ORAL_SOLUTION | ORAL | Status: DC | PRN

## 2011-08-01 MED ORDER — METHOCARBAMOL 500 MG PO TABS: 500.0000 mg | ORAL_TABLET | Freq: Four times a day (QID) | ORAL | Status: DC | PRN

## 2011-08-01 MED ORDER — ALUM & MAG HYDROXIDE-SIMETH 200-200-20 MG/5ML PO SUSP: 30.0000 mL | ORAL | Status: DC | PRN

## 2011-08-01 MED ORDER — OXYCODONE-ACETAMINOPHEN 10-325 MG PO TABS: 1.0000 | ORAL_TABLET | Freq: Four times a day (QID) | ORAL | Status: AC | PRN

## 2011-08-01 MED ORDER — ONDANSETRON HCL 4 MG PO TABS: 4.0000 mg | ORAL_TABLET | Freq: Four times a day (QID) | ORAL | Status: DC | PRN

## 2011-08-01 MED ORDER — ACETAMINOPHEN 650 MG RE SUPP: 650.0000 mg | Freq: Four times a day (QID) | RECTAL | Status: DC | PRN

## 2011-08-01 MED ORDER — HYDROMORPHONE HCL PF 1 MG/ML IJ SOLN: 0.2500 mg | INTRAMUSCULAR | Status: DC | PRN

## 2011-08-01 MED ORDER — METHOCARBAMOL 500 MG PO TABS: 500.0000 mg | ORAL_TABLET | Freq: Four times a day (QID) | ORAL | Status: AC

## 2011-08-01 MED ORDER — METOCLOPRAMIDE HCL 10 MG PO TABS: 5.0000 mg | ORAL_TABLET | Freq: Three times a day (TID) | ORAL | Status: DC | PRN

## 2011-08-01 MED ORDER — OXYCODONE HCL 10 MG PO TB12
10.0000 mg | ORAL_TABLET | Freq: Two times a day (BID) | ORAL | Status: DC
  Administered 2011-08-01 – 2011-08-02 (×3): 10 mg via ORAL
  Filled 2011-08-01 (×3): qty 1

## 2011-08-01 MED ORDER — ZOLPIDEM TARTRATE 10 MG PO TABS
10.0000 mg | ORAL_TABLET | Freq: Every day | ORAL | Status: DC
  Administered 2011-08-01 – 2011-08-03 (×3): 10 mg via ORAL
  Filled 2011-08-01 (×3): qty 1

## 2011-08-01 MED ORDER — FOLIC ACID 1 MG PO TABS
2.0000 mg | ORAL_TABLET | Freq: Every day | ORAL | Status: DC
  Administered 2011-08-02 – 2011-08-04 (×3): 2 mg via ORAL
  Filled 2011-08-01 (×4): qty 2

## 2011-08-01 MED ORDER — DEXTROSE 5 % IV SOLN
500.0000 mg | Freq: Four times a day (QID) | INTRAVENOUS | Status: DC | PRN
  Filled 2011-08-01: qty 5

## 2011-08-01 MED ORDER — BUPIVACAINE-EPINEPHRINE PF 0.5-1:200000 % IJ SOLN
INTRAMUSCULAR | Status: DC | PRN
  Administered 2011-08-01: 17 mL

## 2011-08-01 MED ORDER — LIDOCAINE HCL (CARDIAC) 20 MG/ML IV SOLN
INTRAVENOUS | Status: DC | PRN
  Administered 2011-08-01: 50 mg via INTRAVENOUS

## 2011-08-01 MED ORDER — PHENYLEPHRINE HCL 10 MG/ML IJ SOLN
20.0000 mg | INTRAVENOUS | Status: DC | PRN
  Administered 2011-08-01: 50 ug/min via INTRAVENOUS

## 2011-08-01 MED ORDER — AMPHETAMINE-DEXTROAMPHETAMINE 10 MG PO TABS: 10.0000 mg | ORAL_TABLET | Freq: Every day | ORAL | Status: DC

## 2011-08-01 MED ORDER — NEOSTIGMINE METHYLSULFATE 1 MG/ML IJ SOLN
INTRAMUSCULAR | Status: DC | PRN
  Administered 2011-08-01: 3 mg via INTRAVENOUS

## 2011-08-01 MED ORDER — OXYCODONE HCL 10 MG PO TB12: 10.0000 mg | ORAL_TABLET | ORAL | Status: DC | PRN

## 2011-08-01 MED ORDER — GLYCOPYRROLATE 0.2 MG/ML IJ SOLN
INTRAMUSCULAR | Status: DC | PRN
  Administered 2011-08-01: 0.4 mg via INTRAVENOUS

## 2011-08-01 MED ORDER — FENTANYL CITRATE 0.05 MG/ML IJ SOLN
INTRAMUSCULAR | Status: AC
Start: 2011-08-01 — End: 2011-08-01
  Filled 2011-08-01: qty 2

## 2011-08-01 MED ORDER — PROPOFOL 10 MG/ML IV EMUL
INTRAVENOUS | Status: DC | PRN
  Administered 2011-08-01: 200 mg via INTRAVENOUS

## 2011-08-01 MED ORDER — HYDROMORPHONE HCL 2 MG PO TABS
2.0000 mg | ORAL_TABLET | ORAL | Status: AC | PRN
Start: 2011-08-01 — End: 2011-08-11

## 2011-08-01 SURGICAL SUPPLY — 73 items
BENZOIN TINCTURE PRP APPL 2/3 (GAUZE/BANDAGES/DRESSINGS) IMPLANT
BIT DRILL QUICK RELEASE PRPHRL (DRILL) ×3 IMPLANT
BLADE SAW SAG 29X58X.64 (BLADE) ×2 IMPLANT
BOOTCOVER CLEANROOM LRG (PROTECTIVE WEAR) ×2 IMPLANT
BOWL SMART MIX CTS (DISPOSABLE) ×2 IMPLANT
BRUSH FEMORAL CANAL (MISCELLANEOUS) IMPLANT
CEMENT BONE DEPUY (Cement) ×2 IMPLANT
CLOTH BEACON ORANGE TIMEOUT ST (SAFETY) ×2 IMPLANT
COVER SURGICAL LIGHT HANDLE (MISCELLANEOUS) ×2 IMPLANT
COVER TABLE BACK 60X90 (DRAPES) IMPLANT
DRAPE C-ARM 42X72 X-RAY (DRAPES) IMPLANT
DRAPE INCISE IOBAN 66X45 STRL (DRAPES) ×2 IMPLANT
DRAPE U-SHAPE 47X51 STRL (DRAPES) ×2 IMPLANT
DRILL BIT 5/64 (BIT) ×2 IMPLANT
DRILL QUICK RELEASE PERIPHERAL (DRILL) ×6
DRSG MEPILEX BORDER 4X8 (GAUZE/BANDAGES/DRESSINGS) ×2 IMPLANT
DRSG PAD ABDOMINAL 8X10 ST (GAUZE/BANDAGES/DRESSINGS) IMPLANT
DURAPREP 26ML APPLICATOR (WOUND CARE) ×2 IMPLANT
ELECT BLADE 6.5 EXT (BLADE) IMPLANT
ELECT NEEDLE TIP 2.8 STRL (NEEDLE) ×2 IMPLANT
ELECT REM PT RETURN 9FT ADLT (ELECTROSURGICAL) ×2
ELECTRODE REM PT RTRN 9FT ADLT (ELECTROSURGICAL) ×1 IMPLANT
EVACUATOR 1/8 PVC DRAIN (DRAIN) IMPLANT
FACESHIELD LNG OPTICON STERILE (SAFETY) ×4 IMPLANT
GLOVE BIOGEL PI IND STRL 8 (GLOVE) ×2 IMPLANT
GLOVE BIOGEL PI INDICATOR 8 (GLOVE) ×2
GLOVE BIOGEL PI ORTHO PRO SZ7 (GLOVE) ×1
GLOVE ORTHO TXT STRL SZ7.5 (GLOVE) ×4 IMPLANT
GLOVE PI ORTHO PRO STRL SZ7 (GLOVE) ×1 IMPLANT
GLOVE SURG ORTHO 8.0 STRL STRW (GLOVE) ×4 IMPLANT
GLOVE SURG SS PI 7.0 STRL IVOR (GLOVE) ×2 IMPLANT
GOWN PREVENTION PLUS XXLARGE (GOWN DISPOSABLE) ×2 IMPLANT
GOWN STRL NON-REIN LRG LVL3 (GOWN DISPOSABLE) IMPLANT
GOWN STRL REIN XL XLG (GOWN DISPOSABLE) ×4 IMPLANT
HANDPIECE INTERPULSE COAX TIP (DISPOSABLE)
HOOD PEEL AWAY FACE SHEILD DIS (HOOD) IMPLANT
KIT BASIN OR (CUSTOM PROCEDURE TRAY) ×2 IMPLANT
KIT ROOM TURNOVER OR (KITS) ×2 IMPLANT
MANIFOLD NEPTUNE II (INSTRUMENTS) ×2 IMPLANT
NEEDLE 1/2 CIR CATGUT .05X1.09 (NEEDLE) ×2 IMPLANT
NEEDLE HYPO 25GX1X1/2 BEV (NEEDLE) IMPLANT
NS IRRIG 1000ML POUR BTL (IV SOLUTION) ×2 IMPLANT
PACK SHOULDER (CUSTOM PROCEDURE TRAY) ×2 IMPLANT
PAD ARMBOARD 7.5X6 YLW CONV (MISCELLANEOUS) ×4 IMPLANT
PIN STEINMANN THREADED TIP (PIN) ×2 IMPLANT
PIN THREADED REVERSE (PIN) ×2 IMPLANT
RETRIEVER SUT HEWSON (MISCELLANEOUS) IMPLANT
SET HNDPC FAN SPRY TIP SCT (DISPOSABLE) IMPLANT
SLING ARM FOAM STRAP MED (SOFTGOODS) ×2 IMPLANT
SLING ARM IMMOBILIZER LRG (SOFTGOODS) IMPLANT
SLING ARM IMMOBILIZER MED (SOFTGOODS) IMPLANT
SMARTMIX MINI TOWER (MISCELLANEOUS)
SPONGE GAUZE 4X4 12PLY (GAUZE/BANDAGES/DRESSINGS) IMPLANT
SPONGE LAP 18X18 X RAY DECT (DISPOSABLE) ×2 IMPLANT
STRIP CLOSURE SKIN 1/2X4 (GAUZE/BANDAGES/DRESSINGS) IMPLANT
SUCTION FRAZIER TIP 10 FR DISP (SUCTIONS) ×2 IMPLANT
SUPPORT WRAP ARM LG (MISCELLANEOUS) IMPLANT
SUT FIBERWIRE #2 38 REV NDL BL (SUTURE) ×4
SUT FIBERWIRE #2 38 T-5 BLUE (SUTURE) ×6
SUT MNCRL AB 4-0 PS2 18 (SUTURE) ×2 IMPLANT
SUT VIC AB 0 CT1 27 (SUTURE) ×1
SUT VIC AB 0 CT1 27XBRD ANBCTR (SUTURE) ×1 IMPLANT
SUT VIC AB 2-0 CT1 27 (SUTURE) ×1
SUT VIC AB 2-0 CT1 TAPERPNT 27 (SUTURE) ×1 IMPLANT
SUTURE FIBERWR #2 38 T-5 BLUE (SUTURE) ×3 IMPLANT
SUTURE FIBERWR#2 38 REV NDL BL (SUTURE) ×2 IMPLANT
SYR CONTROL 10ML LL (SYRINGE) IMPLANT
TOWEL OR 17X24 6PK STRL BLUE (TOWEL DISPOSABLE) ×2 IMPLANT
TOWEL OR 17X26 10 PK STRL BLUE (TOWEL DISPOSABLE) ×2 IMPLANT
TOWER SMARTMIX MINI (MISCELLANEOUS) IMPLANT
TRAY FOLEY CATH 14FR (SET/KITS/TRAYS/PACK) IMPLANT
TUBE SUCT ARGYLE STRL (TUBING) IMPLANT
WATER STERILE IRR 1000ML POUR (IV SOLUTION) ×2 IMPLANT

## 2011-08-01 NOTE — Op Note (Signed)
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  ANESTHESIA:   General  PREOPERATIVE INDICATIONS:  Kara [name] is a  56 y.o. 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    The risks benefits and alternatives were discussed with the patient preoperatively including but not limited to the risks of infection, bleeding, nerve injury, cardiopulmonary complications, the need for revision surgery, among others, and the patient was willing to proceed. We also discussed the need for revision surgery, hardware loosening, incomplete relief of pain, stiffness, loss of overhead function, among others.  OPERATIVE IMPLANTS: Biomet size 10 mm press-fit standard length humeral stem with a small glenoid implant, polyethylene cemented 3 paid with a regenerates central peg. I used a 46 x 18 x 53 mm Versed I'll humeral head slightly offset located in the back.  OPERATIVE FINDINGS: Advanced collapse of the humeral head with pannus formation around the glenohumeral joint itself consistent with rheumatoid disease.  OPERATIVE PROCEDURE: The patient was brought to the operating room and placed in the supine position. General anesthesia was administered. Regional block and given. IV antibiotics were given. She was placed in a beachchair position, and all bony prominences padded. The left upper extremity was prepped and draped in usual sterile fashion. Time out was performed.  Deltopectoral approach was performed and the cephalic vein was  retracted laterally. The subscapularis was released using a tenotomy type technique. I mobilized the subdeltoid adhesions, of which there were a fairly significant amount. I then exposed the proximal humerus, and released the inferior capsular ligament circumferentially. I was then able to dislocate the humerus. Prior to doing this I identified the biceps, which was remarkably scarred down within the groove, but I did tenodesis to the pectoralis tendon.  Once I dislocated humeral head I then sequentially reamed up to a size 10. This had snug cortical fit. I applied the jig, and resected my proximal humerus in 30 of retroversion.  I then turned my attention to the glenoid.  Deep retractors were placed, and I performed a circumferential excision of the labrum, as well as release of the glenoid labrum off of the face of the scapula and neck. I then had excellent mobility, complete release, and complete access to the glenoid. I placed a guidewire into the central location, and then reamed. I took about 1 mm of cartilage and bone, taking care to preserve the majority of the subcortical bone.  I then drilled for the regenerex peg, placed my drill guide for the peripheral holes, and then drilled the peripheral holes. I had excellent bone and two out of three, although the anterior hole did penetrate.  I then cleaned the glenoid and prepared the glenoid for implantation. I placed cement in the 3 holes, and then impacted the real prosthesis in place. Excellent press-fit was achieved. A small was the correct size. I did not attempt to correct significant version, and maintained appropriate length inclination of the implant on the coronal plane.  Once the cement had cured, I turned my attention to the proximal humerus, and this was delivered, and then I sequentially broached  up to size 10. I trialed with the above-named trials, and had excellent soft tissue restoration of tension and stability and smooth arc of  motion.  The above named components were selected, and I impacted the real prosthesis into place, and then placed the real humeral head, with the dial posteriorly, providing additional posterior coverage.  I then irrigated the wounds copiously once more, and repaired the subscapularis as well as the rotator interval with 20 FiberWire. Excellent repair was achieved. I irrigated once more and closed the deltopectoral fascia with 0 Vicryl followed by 30 for subcutaneous tissue with Steri-Strips and sterile gauze for the skin. She was placed in a sling, and awakened and activated and returned to the PACU in stable and satisfactory condition. There were no complications.

## 2011-08-01 NOTE — Anesthesia Preprocedure Evaluation (Addendum)
Anesthesia Evaluation  Patient identified by MRN, date of birth, ID band Patient awake    Reviewed: Allergy & Precautions, H&P , NPO status , Patient's Chart, lab work & pertinent test results  Airway Mallampati: I TM Distance: >3 FB Neck ROM: full    Dental  (+) Dental Advidsory Given and Teeth Intact   Pulmonary          Cardiovascular negative cardio ROS  Rhythm:regular Rate:Normal     Neuro/Psych PSYCHIATRIC DISORDERS  Neuromuscular disease    GI/Hepatic negative GI ROS, Neg liver ROS,   Endo/Other  negative endocrine ROS  Renal/GU      Musculoskeletal   Abdominal   Peds  Hematology negative hematology ROS (+)   Anesthesia Other Findings   Reproductive/Obstetrics                          Anesthesia Physical Anesthesia Plan  ASA: I  Anesthesia Plan: General   Post-op Pain Management:    Induction: Intravenous  Airway Management Planned: Oral ETT  Additional Equipment:   Intra-op Plan:   Post-operative Plan: Extubation in OR  Informed Consent: I have reviewed the patients History and Physical, chart, labs and discussed the procedure including the risks, benefits and alternatives for the proposed anesthesia with the patient or authorized representative who has indicated his/her understanding and acceptance.     Plan Discussed with: CRNA, Anesthesiologist and Surgeon  Anesthesia Plan Comments:         Anesthesia Quick Evaluation

## 2011-08-01 NOTE — H&P (Signed)
PREOPERATIVE H&P  Chief Complaint: right shoulder AVN  HPI: Kara [name] is a 56 y.o. female who presents for preoperative history and physical with a diagnosis of right shoulder AVN and RA. Symptoms are rated as moderate to severe, and have been worsening.  This is significantly impairing activities of daily living.  She has elected for surgical management.   Past Medical History  Diagnosis Date  . Fibromyalgia   . 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 Arthritis   . Rheumatoid arthritis     autoimmune arthritis; methotrexate once/week  . Depression     takes meds daily  . Post-nasal drip     hx of   Past Surgical History  Procedure Date  . Shoulder surgery 2012    right  . Back surgery 2011    lower back fusion l4-5, s1  . Bladder surgery 2009  . Tubal ligation 1988  . Breast enhancement surgery 2003  . Liposuction 2003    abdominal  . Tonsillectomy 1987   History   Social History  . Marital Status: Married    Spouse Name: N/A    Number of Children: N/A  . Years of Education: N/A   Social History Main Topics  . Smoking status: Never Smoker   . Smokeless tobacco: None  . Alcohol Use: No  . Drug Use: Yes    Special: Marijuana     rarely  . Sexually Active: Yes    Birth Control/ Protection: Post-menopausal   Other Topics Concern  . None   Social History Narrative  . None   Family History  Problem Relation Age of Onset  . Anesthesia problems Neg Hx   . Hypotension Neg Hx   . Malignant hyperthermia Neg Hx   . Pseudochol deficiency Neg Hx    Allergies  Allergen Reactions  . Aspirin Anaphylaxis  . Ibuprofen Anaphylaxis  . Tramadol Hcl Itching and Rash   Prior to Admission medications   Medication Sig Start Date End Date Taking? Authorizing Provider  amphetamine-dextroamphetamine (ADDERALL) 10 MG tablet Take 10 mg by mouth daily. Takes with #2 30 mg tabs to total 70 mg   Yes Historical Provider, MD    amphetamine-dextroamphetamine (ADDERALL) 30 MG tablet Take 60 mg by mouth daily. Takes with a 10 mg tab to total 70 mg daily   Yes Historical Provider, MD  clonazePAM (KLONOPIN) 0.5 MG tablet Take 0.5 mg by mouth 3 (three) times daily as needed. For anxiety.   Yes Historical Provider, MD  FLUoxetine (PROZAC) 20 MG capsule Take 20 mg by mouth daily.   Yes Historical Provider, MD  folic acid (FOLVITE) 1 MG tablet Take 2 mg by mouth daily.   Yes Historical Provider, MD  meloxicam (MOBIC) 15 MG tablet Take 15 mg by mouth daily.   Yes Historical Provider, MD  Methotrexate Sodium, PF, 50 MG/2ML SOLN Inject 10 mg as directed once a week. On Wednesdays. 0.4 ML = 10 MG   Yes Historical Provider, MD  oxyCODONE (OXYCONTIN) 10 MG 12 hr tablet Take 10 mg by mouth every 4 (four) hours as needed.   Yes Historical Provider, MD  venlafaxine XR (EFFEXOR-XR) 150 MG 24 hr capsule Take 150 mg by mouth 2 (two) times daily.   Yes Historical Provider, MD  zolpidem (AMBIEN) 10 MG tablet Take 10 mg by mouth at bedtime.    Yes Historical Provider, MD     Positive ROS: All other systems have  been reviewed and were otherwise negative with the exception of those mentioned in the HPI and as above.  Physical Exam: General: Alert, no acute distress Cardiovascular: No pedal edema Respiratory: No cyanosis, no use of accessory musculature GI: No organomegaly, abdomen is soft and non-tender Skin: No lesions in the area of chief complaint Neurologic: Sensation intact distally Psychiatric: Patient is competent for consent with normal mood and affect Lymphatic: No axillary or cervical lymphadenopathy  MUSCULOSKELETAL: R shoulder arom 0-70, weak secondary to pain.  Assessment: right shoulder torn rotator cuff  Plan: Plan for Procedure(s): TOTAL SHOULDER ARTHROPLASTY  The risks benefits and alternatives were discussed with the patient including but not limited to the risks of nonoperative treatment, versus surgical  intervention including infection, bleeding, nerve injury,  blood clots, cardiopulmonary complications, morbidity, mortality, among others, and they were willing to proceed.   Eulas Post, MD 08/01/2011 10:01 AM

## 2011-08-01 NOTE — Anesthesia Postprocedure Evaluation (Signed)
  Anesthesia Post-op Note  Patient: Kara [name]  Procedure(s) Performed: Procedure(s) (LRB): TOTAL SHOULDER ARTHROPLASTY (Right)  Patient Location: PACU  Anesthesia Type: GA combined with regional for post-op pain  Level of Consciousness: awake, alert  and oriented  Airway and Oxygen Therapy: Patient Spontanous Breathing  Post-op Pain: mild  Post-op Assessment: Post-op Vital signs reviewed  Post-op Vital Signs: Reviewed  Complications: No apparent anesthesia complications

## 2011-08-01 NOTE — Progress Notes (Signed)
Report given to mark rn as caregiver 

## 2011-08-01 NOTE — Discharge Instructions (Signed)
Shoulder Joint Replacement Shoulder replacement (arthroplasty) is a procedure that may be recommended if joint disease makes your shoulder stiff and painful, or if the upper arm bone is badly damaged from an accident. The shoulder is a ball-and-socket joint that allows for a wide range of motion. The head of the upper arm bone (humerus) is the ball, and a circular depression (glenoid) in the shoulder bone (scapula) is the socket. A soft-tissue rim (labrum) surrounds and deepens the socket. The head of the upper arm bone is coated with a smooth, durable covering called cartilage, and the joint has a thin, inner lining (synovium) for smooth movement. The surrounding muscles and tendons provide stability and support. IMPLANT DESIGN AND CONSTRUCTION  Shoulder replacement surgery replaces damaged surfaces with artificial parts (prostheses). Usually, there are two parts used to replace this joint.  The humeral component replaces the head of the upper arm bone. It is made of metal (usually cobalt/chromium-based alloys). This is a rounded ball attached to a stem that fits into the humerus bone. This part comes in various sizes and can be a single piece or a modular unit.   The glenoid component replaces the socket (the glenoid depression). It is made of ultrahigh density polyethelene. Some versions have a metal tray, but totally plastic versions are more common.  Depending on the damage to your shoulder, the surgeon may replace just the humeral head (a hemiarthroplasty) or both the humeral head and the glenoid (total shoulder replacement). The shoulder parts come in various sizes and shapes to fit the patient. They are held in place with either bone cement (cemented) or bone ingrowth (cementless).  The surrounding muscles and tendons hold the prosthesis parts in place, the same as the original shoulder. Each case is individual and your surgeon will study your situation carefully before making any decisions. Ask  what type of implant will be used in you and why that choice is appropriate for you. RISKS AND COMPLICATIONS  Complications after shoulder replacement surgery occur less often than with other joint replacement surgeries. However, there are risks. The most common complications are:  Infection.   Upper arm bone fracture that occurs during surgery (intraoperative fracture) or postoperative fractures.   Postoperative instability.   Loosening of the glenoid component over time.  Advances in surgical techniques and prosthetic devices are helping to lessen the chances of complications.  PROCEDURE   Either regional (numb in the shoulder area) or general (sleep during the procedure) anesthesia may be used during shoulder replacement surgery. Your caregiver or anesthesiologist will advise you on the best type of anesthesia for you.   The surgical cut (incision) is 4" to 6" (10cm to 15cm) long and is made on the front of the shoulder from the collarbone (clavicle) to the point where the shoulder muscle (deltoid) attaches to the upper arm bone. The surgeon will take care not to injure the nerves or blood vessels that cross the shoulder.   The upper arm bone is dislocated from the socket to expose the ball-like end of the upper arm. Only the portion of the bone covered by cartilage is removed. Articular cartilage covers the ends of bones where they meet the ends of other bones.   The center cavity of the humerus bone is cleaned and enlarged with reamers to create a hollow area that matches the shape of the implant stem. The top end of the bone is smoothed so the stem can be inserted flush with the bone surface.     If the ball of the prosthesis is a separate piece, the proper size is selected and attached.   If the socket portion of the joint is basically healthy and the surrounding muscles are intact, the surgeon may decide not to replace it. However, if the socket is arthritic, the upper arm bone is moved  to the back and the surgeon will implant the glenoid component. The surgeon prepares the socket surface by removing the remaining damaged cartilage. The socket bone is then gently reamed to match the implant. Protrusions on the artifical socket part are then fitted into holes drilled in the bone surface. Once the part fits it is cemented into position.   The arm bone, with its new artificial head, is replaced in the socket. The surgeon reattaches the supporting tendons and closes the incision.   The arm is placed in a sling and a support pillow is placed under the elbow to protect the repair.   Tubes are placed to remove excess drainage. These are usually removed a day or two later.  REHABILITATION AFTER SURGERY A rehabilitation program is important to the success of the operation. If the surgery is scheduled for the morning, therapy can begin later that day, and no later than the first day after the procedure. A physical therapist will start gentle range of motion exercises. In these, your arm is gently put through all its motions. Before you leave the hospital (usually two or three days after surgery), your therapist will show you in how to use a pulley device to help bend and extend your arm and will give you directions for other home exercises. HOME CARE INSTRUCTIONS  You may resume normal diet and activities as directed or allowed. Wear the sling every night for at least the first month, or as instructed by your surgeon.   Do not use your arm to push yourself up in bed or from a chair. This requires too much force on the surgically repaired muscles.   Follow the program of home exercises suggested. Do the exercises 4 to 5 times a day for a month or as directed.   Try not to overuse your shoulder. It is easy to do if this is the first time you have been pain free in a long time. Early overuse of the shoulder may result in later problems.   Do not lift anything heavier than a cup of coffee for  the first 6 weeks after surgery.   Ask for help at home. Your caregiver may be able to suggest an agency for this if you do not have home support.   Do not participate in contact sports or do any heavy lifting (more than 10 pounds) for at least 6 months, or as directed.   Keep ice packs (a bag of ice wrapped in a towel) on the surgical area for 15 to 20 minutes, 3 to 4 times per day, for the first two days following surgery.   Change dressings if necessary or as directed. Shower and get the wound wet as directed.   Only take over-the-counter or prescription medicines for pain, discomfort, or fever as directed by your caregiver.   Follow the directions of your surgeon.   Keep appointments as directed.  Document Released: 12/10/2002 Document Revised: 03/02/2011 Document Reviewed: 03/03/2008 [employer] Patient Information 2012 [employer]. 

## 2011-08-01 NOTE — Anesthesia Procedure Notes (Signed)
Anesthesia Regional Block:  Interscalene brachial plexus block  Pre-Anesthetic Checklist: ,, timeout performed, Correct Patient, Correct Site, Correct Laterality, Correct Procedure, Correct Position, site marked, Risks and benefits discussed,  Surgical consent,  Pre-op evaluation,  At surgeon's request and post-op pain management  Laterality: Right  Prep: Maximum Sterile Barrier Precautions used, chloraprep and alcohol swabs       Needles:  Injection technique: Single-shot  Needle Type: Stimulator Needle - 40        Needle insertion depth: 5 cm   Additional Needles:  Procedures: nerve stimulator Interscalene brachial plexus block  Nerve Stimulator or Paresthesia:  Response: 0.5 mA, 0.1 ms, 5 cm  Additional Responses:   Narrative:  Start time: 08/01/2011 10:10 AM End time: 08/01/2011 10:18 AM  Performed by: Personally  Anesthesiologist: Maren Beach MD  Additional Notes: 17cc 0.5% Marcaine w/epi  W/o difficulty or discomfort.  GES

## 2011-08-01 NOTE — Preoperative (Signed)
Beta Blockers   Reason not to administer Beta Blockers:Not Applicable 

## 2011-08-01 NOTE — Transfer of Care (Signed)
Immediate Anesthesia Transfer of Care Note  Patient: Kara [name]  Procedure(s) Performed: Procedure(s) (LRB): TOTAL SHOULDER ARTHROPLASTY (Right)  Patient Location: PACU  Anesthesia Type: General  Level of Consciousness: awake, alert  and oriented  Airway & Oxygen Therapy: Patient Spontanous Breathing and Patient connected to nasal cannula oxygen  Post-op Assessment: Report given to PACU RN and Post -op Vital signs reviewed and stable  Post vital signs: Reviewed and stable  Complications: No apparent anesthesia complications

## 2011-08-02 LAB — BASIC METABOLIC PANEL
BUN: 11 mg/dL (ref 6–23)
Calcium: 8.8 mg/dL (ref 8.4–10.5)
Creatinine, Ser: 0.66 mg/dL (ref 0.50–1.10)
GFR calc Af Amer: >90 mL/min (ref >90)
GFR calc non Af Amer: >90 mL/min (ref >90)
Glucose, Bld: 105 mg/dL — ABNORMAL HIGH (ref 70–99)

## 2011-08-02 LAB — CBC
HCT: 34 % — ABNORMAL LOW (ref 36.0–46.0)
Hemoglobin: 11.1 g/dL — ABNORMAL LOW (ref 12.0–15.0)
MCH: 28 pg (ref 26.0–34.0)
MCHC: 32.6 g/dL (ref 30.0–36.0)
MCV: 85.9 fL (ref 78.0–100.0)
RDW: 13.9 % (ref 11.5–15.5)

## 2011-08-02 MED ORDER — MORPHINE SULFATE 2 MG/ML IJ SOLN
2.0000 mg | INTRAMUSCULAR | Status: DC | PRN
  Administered 2011-08-03 (×2): 2 mg via INTRAVENOUS
  Filled 2011-08-02 (×3): qty 1

## 2011-08-02 MED ORDER — VENLAFAXINE HCL ER 150 MG PO CP24
150.0000 mg | ORAL_CAPSULE | Freq: Two times a day (BID) | ORAL | Status: DC
  Administered 2011-08-02 – 2011-08-04 (×3): 150 mg via ORAL
  Filled 2011-08-02 (×7): qty 1

## 2011-08-02 NOTE — Evaluation (Signed)
Occupational Therapy Evaluation Patient Details Name: Kara [name] MRN: [id number] DOB: Oct 03, 1955 Today's Date: 08/02/2011 Time: 4098-1191 OT Time Calculation (min): 51 min  OT Assessment / Plan / Recommendation Clinical Impression  This 56 y.o. female admitted for Rt. shoulder arthroplasty.  Pt. with increased pain initially, but able to participate after receiving Dilauded.  Pt. and husband were instructed in shoulder precautions, sling wear, and ADLs.  Pt. will need continued education due to decreased carry over due to pain meds.  Pt. demonstrates the below listed deficits, and will benefit from OT to maximize safety and independence with BADLs to return home with husband and min A    OT Assessment  Patient needs continued OT Services    Follow Up Recommendations  Supervision/Assistance - 24 hour (follow up OT at MD discretion)    Equipment Recommendations  None recommended by OT    Frequency Min 2X/week    Precautions / Restrictions Precautions Precautions: Shoulder Type of Shoulder Precautions: Sling and ADL education Precaution Booklet Issued: Yes (comment) Precaution Comments: handout Required Braces or Orthoses: Other Brace/Splint (sling) Restrictions Weight Bearing Restrictions: Yes RUE Weight Bearing: Non weight bearing       ADL  Eating/Feeding: Simulated;Set up Where Assessed - Eating/Feeding: Bed level Grooming: Performed;Wash/dry hands;Wash/dry face;Teeth care;Supervision/safety Where Assessed - Grooming: Standing at sink Upper Body Bathing: Simulated;Minimal assistance Where Assessed - Upper Body Bathing: Standing at sink Lower Body Bathing: Simulated;Minimal assistance Where Assessed - Lower Body Bathing: Sit to stand from chair Upper Body Dressing: Simulated;Maximal assistance Where Assessed - Upper Body Dressing: Sitting, bed Lower Body Dressing: Simulated;Moderate assistance Where Assessed - Lower Body Dressing: Sit to stand from bed Toilet Transfer:  Simulated;Supervision/safety Toilet Transfer Method: Proofreader: Comfort height toilet Toileting - Clothing Manipulation: Simulated;Minimal assistance Where Assessed - Glass blower/designer Manipulation: Standing Toileting - Hygiene: Simulated;Supervision/safety Where Assessed - Toileting Hygiene: Sit on 3-in-1 or toilet Equipment Used:  (sling) Ambulation Related to ADLs: supervision pushing IV pole ADL Comments: Pt. initially with pain 10/10.  RN gave pt. Dilauded.  Pt. tearful and initially refusing OT.  Assisted pt. with positioning in bed, and with applying sling (sling was lying at foot of bed).  Pt. appeard notably more comfortable.  pt. and husband were instructed in sling schedule, and sling application, bed positioning, precautions.  Pt. then agreeable to OOB. Pt. ambulated on unit and reported pain decreased.  Pt. returned to room, and proceeded with grooming,  verbal instruction provided for UB bathing and dressing.  Pt. "loopy" due to pain medication and with decreased carry over of information, however, husband verbalized understanding.  Written handout left in room.    OT Goals Acute Rehab OT Goals OT Goal Formulation: With patient/family Time For Goal Achievement: 08/09/11 Potential to Achieve Goals: Good ADL Goals Pt Will Perform Upper Body Bathing: with supervision;Standing at sink ADL Goal: Upper Body Bathing - Progress: Goal set today Pt Will Perform Lower Body Bathing: with supervision;Sit to stand from chair;Sit to stand from bed ADL Goal: Lower Body Bathing - Progress: Goal set today Pt Will Perform Upper Body Dressing: with supervision;Sitting, bed;Unsupported ADL Goal: Upper Body Dressing - Progress: Goal set today Additional ADL Goal #1: Pt and husband will be independent with sling wear and care ADL Goal: Additional Goal #1 - Progress: Goal set today Additional ADL Goal #2: Pt. and husband will be independent with shoulder precautions ADL  Goal: Additional Goal #2 - Progress: Goal set today  Visit Information  Last OT Received On:  08/02/11    Subjective Data  Subjective: "It hurts so bad I can't take it any more!" Patient Stated Goal: To get better.  Reduce pain   Prior Functioning  Home Living Lives With: Spouse Available Help at Discharge: Available 24 hours/day Type of Home: House Home Access: Stairs to enter [employer] of Steps: 5 Home Layout: One level Bathroom Shower/Tub: Tub/shower unit;Curtain;Walk-in shower (walk in shower outside) [employer]: Standard Prior Function Level of Independence: Needs assistance Needs Assistance: Dressing;Bathing;Meal Prep;Light Housekeeping Dressing: Moderate Meal Prep: Maximal Light Housekeeping: Maximal Able to Take Stairs?: Yes Driving: Yes Vocation: Full time employment Communication Communication: No difficulties Dominant Hand: Right    Cognition  Overall Cognitive Status: Impaired Area of Impairment: Attention;Problem solving Arousal/Alertness: Awake/alert Orientation Level: Appears intact for tasks assessed Behavior During Session:  (tearful to pleasant) Current Attention Level: Sustained Cognition - Other Comments: Cognitive deficits due to pain medication    Extremity/Trunk Assessment Right Upper Extremity Assessment RUE ROM/Strength/Tone: Deficits RUE ROM/Strength/Tone Deficits: shoulder arthroplasty Left Upper Extremity Assessment LUE ROM/Strength/Tone: Within functional levels LUE Coordination: WFL - gross/fine motor Trunk Assessment Trunk Assessment: Normal   Mobility Bed Mobility Bed Mobility: Supine to Sit Supine to Sit: 5: Supervision;With rails;HOB flat Transfers Transfers: Sit to Stand;Stand to Sit Sit to Stand: 6: Modified independent (Device/Increase time);From bed;With upper extremity assist;From chair/3-in-1 Stand to Sit: 7: Independent;With upper extremity assist;To bed;To chair/3-in-1   Exercise    Balance  Balance Balance Assessed: Yes Dynamic Standing Balance Dynamic Standing - Level of Assistance: 5: Stand by assistance (due to IV) High Level Balance High Level Balance Activites:  (ADls)  End of Session OT - End of Session Equipment Utilized During Treatment: Other (comment) (sling) Activity Tolerance: Patient tolerated treatment well Patient left: in chair;with call bell/phone within reach;with family/visitor present Nurse Communication: Patient requests pain meds   Florrie Ramires, Ursula Alert M 08/02/2011, 9:55 PM

## 2011-08-02 NOTE — Progress Notes (Signed)
Utilization review completed. Anette Guarneri, RN, BSN. 08/02/11

## 2011-08-02 NOTE — Progress Notes (Signed)
Patient ID: Kara [name], female   DOB: 1955/04/29, 56 y.o.   MRN: [id number]     Subjective:  Patient reports pain as moderate to severe.  Patient denies numbness, CP or SOB.  Objective:   VITALS:   Filed Vitals:   08/01/11 1543 08/01/11 1900 08/01/11 2136 08/02/11 0702  BP: 101/57 110/56 100/59 120/70  Pulse: 85 76 84 97  Temp: 98.9 F (37.2 C) 98.9 F (37.2 C) 98.2 F (36.8 C) 100.4 F (38 C)  TempSrc:   Oral   Resp: 18 18 18 16   SpO2: 97% 98% 92% 93%    ABD soft Sensation intact distally Dorsiflexion/Plantar flexion intact Incision: dressing C/D/I and no drainage  LABS  No results found for this or any previous visit (from the past 24 hour(s)).  Dg Shoulder Right Port  08/01/2011  *RADIOLOGY REPORT*  Clinical Data: Post right total shoulder arthroplasty  PORTABLE RIGHT SHOULDER - 2+ VIEW  Comparison: Portable exam 1447 hours without priors for comparison  Findings: Right shoulder prosthesis in expected position. No dislocation or fracture identified. AC joint alignment normal.  IMPRESSION: Right shoulder prosthesis without acute abnormality.  Original Report Authenticated By: Lollie Marrow, M.D.    Assessment/Plan: 1 Day Post-Op   Principal Problem:  *Other specified rheumatoid arthritis, right shoulder   Advance diet Up with therapy Plan for DC on Thurs or Fri.   Haskel Khan 08/02/2011, 8:04 AM   Teryl Lucy, MD [phone] pager

## 2011-08-02 NOTE — Care Management Note (Signed)
  Page 1 of 1   08/02/2011     3:38:40 PM   CARE MANAGEMENT NOTE 08/02/2011  Patient:  Kara [name], Kara [name]   Account Number:  [id number]  Date Initiated:  08/02/2011  Documentation initiated by:  Anette Guarneri  Subjective/Objective Assessment:   56yr old female, POD#1, right TSA  OT evalulation pending     Action/Plan:   CM will follow for recommendations regarding possible HH needs   Anticipated DC Date:  08/04/2011   Anticipated DC Plan:  HOME/SELF CARE         Choice offered to / List presented to:             Status of service:   Medicare Important Message given?   (If response is "NO", the following Medicare IM given date fields will be blank) Date Medicare IM given:   Date Additional Medicare IM given:    Discharge Disposition:    Per UR Regulation:  Reviewed for med. necessity/level of care/duration of stay  If discussed at Long Length of Stay Meetings, dates discussed:    Comments:

## 2011-08-03 MED ORDER — OXYCODONE HCL 10 MG PO TB12
10.0000 mg | ORAL_TABLET | Freq: Two times a day (BID) | ORAL | Status: DC
  Administered 2011-08-03 (×2): 10 mg via ORAL
  Filled 2011-08-03 (×2): qty 1

## 2011-08-03 MED ORDER — OXYCODONE HCL 10 MG PO TB12: 10.0000 mg | ORAL_TABLET | Freq: Two times a day (BID) | ORAL | Status: DC

## 2011-08-03 MED ORDER — MORPHINE SULFATE 2 MG/ML IJ SOLN
INTRAMUSCULAR | Status: AC
  Administered 2011-08-03: 1 mg via INTRAVENOUS
  Filled 2011-08-03: qty 1

## 2011-08-03 MED ORDER — MORPHINE SULFATE 2 MG/ML IJ SOLN
1.0000 mg | INTRAMUSCULAR | Status: DC | PRN
  Administered 2011-08-03 (×3): 1 mg via INTRAVENOUS
  Filled 2011-08-03 (×2): qty 1

## 2011-08-03 MED ORDER — HYDROMORPHONE HCL 2 MG PO TABS
2.0000 mg | ORAL_TABLET | ORAL | Status: DC | PRN
  Administered 2011-08-03: 2 mg via ORAL
  Filled 2011-08-03: qty 1

## 2011-08-03 NOTE — Discharge Summary (Signed)
Physician Discharge Summary  Patient ID: Kara [name] MRN: [id number] DOB/AGE: 55/30/1957 55 y.o.  Admit date: 08/01/2011 Discharge date: 08/04/2011  Admission Diagnoses:  Other specified rheumatoid arthritis, right shoulder  Discharge Diagnoses:  Principal Problem:  *Other specified rheumatoid arthritis, right shoulder   Past Medical History  Diagnosis Date  . Fibromyalgia   . 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 Arthritis   . Rheumatoid arthritis     autoimmune arthritis; methotrexate once/week  . Depression     takes meds daily  . Post-nasal drip     hx of  . Other specified rheumatoid arthritis, right shoulder 08/01/2011    Surgeries: Procedure(s): TOTAL SHOULDER ARTHROPLASTY on 08/01/2011   Consultants (if any):    Discharged Condition: Improved  Hospital Course: Kara [name] is an 56 y.o. female who was admitted 08/01/2011 with a diagnosis of Other specified rheumatoid arthritis, right shoulder and went to the operating room on 08/01/2011 and underwent the above named procedures.    She was given perioperative antibiotics:  Anti-infectives     Start     Dose/Rate Route Frequency Ordered Stop   08/01/11 1700   ceFAZolin (ANCEF) IVPB 1 g/50 mL premix        1 g 100 mL/hr over 30 Minutes Intravenous Every 6 hours 08/01/11 1537 08/02/11 0411   07/31/11 1432   ceFAZolin (ANCEF) IVPB 1 g/50 mL premix  Status:  Discontinued        1 g 100 mL/hr over 30 Minutes Intravenous 60 min pre-op 07/31/11 1432 08/01/11 1524        .  She was given sequential compression devices, early ambulation, and SCDs for DVT prophylaxis.  She has chronic pain with high tolerance to narcotics and required multiple regimens and days to get adequate PO pain medication regimen that successfully controlled her pain.   She benefited maximally from the hospital stay and there were no complications.    Recent vital signs:  Filed Vitals:   08/03/11 1400  BP:  114/73  Pulse: 95  Temp: 98.6 F (37 C)  Resp: 16    Recent laboratory studies:  Lab Results  Component Value Date   HGB 11.1* 08/02/2011   HGB 13.3 07/25/2011   HGB 13.1 06/29/2011   Lab Results  Component Value Date   WBC 8.0 08/02/2011   PLT 276 08/02/2011   Lab Results  Component Value Date   INR 1.02 07/25/2011   Lab Results  Component Value Date   NA 136 08/02/2011   K 4.3 08/02/2011   CL 101 08/02/2011   CO2 26 08/02/2011   BUN 11 08/02/2011   CREATININE 0.66 08/02/2011   GLUCOSE 105* 08/02/2011    Discharge Medications:   Medication List  As of 08/03/2011  6:14 PM   STOP taking these medications         meloxicam 15 MG tablet         TAKE these medications         amphetamine-dextroamphetamine 10 MG tablet   Commonly known as: ADDERALL   Take 10 mg by mouth as directed. As directed by physician at the [facility], [place], [place], [place]      clonazePAM 0.5 MG tablet   Commonly known as: KLONOPIN   Take 0.5 mg by mouth 3 (three) times daily as needed. For anxiety.      FLUoxetine 20 MG capsule   Commonly known as: PROZAC  Take 20 mg by mouth daily.      folic acid 1 MG tablet   Commonly known as: FOLVITE   Take 2 mg by mouth daily.      HYDROmorphone 2 MG tablet   Commonly known as: DILAUDID   Take 1 tablet (2 mg total) by mouth every 4 (four) hours as needed for pain.      methocarbamol 500 MG tablet   Commonly known as: ROBAXIN   Take 1 tablet (500 mg total) by mouth 4 (four) times daily.      Methotrexate Sodium (PF) 50 MG/2ML Soln   Inject 10 mg as directed once a week. On Wednesdays. 0.4 ML = 10 MG      oxyCODONE 10 MG 12 hr tablet   Commonly known as: OXYCONTIN   Take 10 mg by mouth every 4 (four) hours as needed.      oxyCODONE 10 MG 12 hr tablet   Commonly known as: OXYCONTIN   Take 1 tablet (10 mg total) by mouth every 12 (twelve) hours.      oxyCODONE-acetaminophen 10-325 MG per tablet   Commonly known as: PERCOCET   Take 1-2 tablets by  mouth every 6 (six) hours as needed for pain. MAXIMUM TOTAL ACETAMINOPHEN DOSE IS 4000 MG PER DAY      promethazine 25 MG tablet   Commonly known as: PHENERGAN   Take 1 tablet (25 mg total) by mouth every 6 (six) hours as needed for nausea.      venlafaxine XR 150 MG 24 hr capsule   Commonly known as: EFFEXOR-XR   Take 150 mg by mouth 2 (two) times daily.      zolpidem 10 MG tablet   Commonly known as: AMBIEN   Take 10 mg by mouth at bedtime.            Diagnostic Studies: Dg Shoulder Right Port  08/21/11  *RADIOLOGY REPORT*  Clinical Data: Post right total shoulder arthroplasty  PORTABLE RIGHT SHOULDER - 2+ VIEW  Comparison: Portable exam 1447 hours without priors for comparison  Findings: Right shoulder prosthesis in expected position. No dislocation or fracture identified. AC joint alignment normal.  IMPRESSION: Right shoulder prosthesis without acute abnormality.  Original Report Authenticated By: Lollie Marrow, M.D.    Disposition: 01-Home or Self Care  Discharge Orders    Future Orders Please Complete By Expires   Diet general      Call MD / Call 911      Comments:   If you experience chest pain or shortness of breath, CALL 911 and be transported to the hospital emergency room.  If you develope a fever above 101 F, pus (white drainage) or increased drainage or redness at the wound, or calf pain, call your surgeon's office.   Discharge instructions      Comments:   Change dressing in 3 days and reapply fresh dressing, unless you have a splint (half cast).  If you have a splint/cast, just leave in place until your follow-up appointment.    Keep wounds dry for 3 weeks.  Leave steri-strips in place on skin.  Do not apply lotion or anything to the wound.   Constipation Prevention      Comments:   Drink plenty of fluids.  Prune juice may be helpful.  You may use a stool softener, such as Colace (over the counter) 100 mg twice a day.  Use MiraLax (over the counter) for  constipation as needed.  Follow-up Information    Follow up with Gertude Benito P, MD in 2 weeks.   Contact information:   [facility] [address], [address] [place] [address] [phone]           Signed: Eulas Post 08/03/2011, 6:14 PM

## 2011-08-03 NOTE — Progress Notes (Signed)
     Subjective:  Patient reports pain as moderate to severe. She wants to try her by mouth regimen of pain control for 24 hours before going home. She is very anxious regarding her postoperative pain control.  Objective:   VITALS:   Filed Vitals:   08/02/11 0702 08/02/11 1300 08/02/11 2055 08/03/11 0720  BP: 120/70 127/71 102/54 104/56  Pulse: 97 99 73 94  Temp: 100.4 F (38 C) 98.7 F (37.1 C) 98.4 F (36.9 C) 98.3 F (36.8 C)  TempSrc:  Oral    Resp: 16 18 16 18   SpO2: 93% 92% 94% 92%    Sensation is intact throughout her hand. All fingers flex extend and abduct. Her dressings are clean. She is not wearing her sling.  LABS  No results found for this or any previous visit (from the past 24 hour(s)).  Dg Shoulder Right Port  08/01/2011  *RADIOLOGY REPORT*  Clinical Data: Post right total shoulder arthroplasty  PORTABLE RIGHT SHOULDER - 2+ VIEW  Comparison: Portable exam 1447 hours without priors for comparison  Findings: Right shoulder prosthesis in expected position. No dislocation or fracture identified. AC joint alignment normal.  IMPRESSION: Right shoulder prosthesis without acute abnormality.  Original Report Authenticated By: Lollie Marrow, M.D.    Assessment/Plan: 2 Days Post-Op   Principal Problem:  *Other specified rheumatoid arthritis, right shoulder  I reinforced the importance of wearing her sling. This is a second time today to see her without her sling on. I counseled her that subscapularis rupture is a real risk, and she needs to maintain her sling at all times. We are going to discontinue her morphine, place her on by mouth Dilaudid as well as Percocet, and discontinue her OxyContin. Her home regimen is planned to be oxycodone with Tylenol (Percocet) and Dilaudid by mouth. She already has Klonopin as well, and is going to use Flexeril at home. We will plan for discharge tomorrow if pain is adequately controlled on her by mouth regimen.  Barbarann Kelly  P 08/03/2011, 8:33 AM   Teryl Lucy, MD [phone] pager

## 2011-08-03 NOTE — Progress Notes (Signed)
Occupational Therapy Treatment Patient Details Name: Kara [name] MRN: [id number] DOB: 09-03-1955 Today's Date: 08/03/2011 Time: 4098-1191 OT Time Calculation (min): 31 min  OT Assessment / Plan / Recommendation Comments on Treatment Session Pt. is somewhat clearer today, but continues to have difficulty with problem solving and carry over of information due to medications.  Pt. is very fixated on her pain, and is insistent that a HHRN can go to her home to give her morphine.  Need for sling to be in place at all times, except ADLs reinforced.  Pt. continues to need assistance for ADLs, sling management, and precautions.  Husband demonstrates good understanding of information.    Follow Up Recommendations  Supervision/Assistance - 24 hour    Barriers to Discharge       Equipment Recommendations  None recommended by OT    Recommendations for Other Services    Frequency Min 2X/week   Plan Discharge plan remains appropriate    Precautions / Restrictions Precautions Precautions: Shoulder Type of Shoulder Precautions: Sling and ADL education Precaution Booklet Issued: Yes (comment) Precaution Comments: handout Required Braces or Orthoses: Other Brace/Splint Restrictions Weight Bearing Restrictions: Yes RUE Weight Bearing: Non weight bearing   Pertinent Vitals/Pain     ADL  Ambulation Related to ADLs: Pt. ambulated on unit to reduce pain. ADL Comments: Pt. in bed with sling in place. Reinforced need to have sling on at all times, except ADL.  Pt. indicates that sling makes her breast hurt, and that is why she removes it.  Re-adjusted sling and pt. indicated less discomfort.  Discussed shoulder precautions emphasizing NO movement of shoulder.   Pt. able to verbalize correct method for bathing and dressing.   Pt. donned/doffed sling with min A and mod verbal cues.    OT Diagnosis:    OT Problem List:   OT Treatment Interventions:     OT Goals ADL Goals ADL Goal: Lower Body Bathing -  Progress: Progressing toward goals ADL Goal: Upper Body Dressing - Progress: Progressing toward goals ADL Goal: Additional Goal #1 - Progress: Progressing toward goals ADL Goal: Additional Goal #2 - Progress: Progressing toward goals  Visit Information  Last OT Received On: 08/03/11    Subjective Data      Prior Functioning       Cognition  Overall Cognitive Status: Impaired Area of Impairment: Attention;Problem solving Arousal/Alertness: Awake/alert Orientation Level: Appears intact for tasks assessed Behavior During Session:  (tearful to pleasant) Current Attention Level: Sustained Problem Solving: mod A Cognition - Other Comments: Cognitive deficits due to pain medication    Mobility Bed Mobility Bed Mobility: Supine to Sit Supine to Sit: 6: Modified independent (Device/Increase time);HOB elevated;With rails Transfers Transfers: Sit to Stand;Stand to Sit Sit to Stand: 6: Modified independent (Device/Increase time);From bed;With upper extremity assist;From chair/3-in-1 Stand to Sit: 7: Independent;With upper extremity assist;To bed;To chair/3-in-1   Exercises    Balance Balance Balance Assessed: Yes  End of Session OT - End of Session Equipment Utilized During Treatment:  (sling) Activity Tolerance: Patient tolerated treatment well Patient left: in chair;with call bell/phone within reach;with family/visitor present Nurse Communication:  (status of session and walking decreases pain)   Sacred Roa M 08/03/2011, 2:41 PM

## 2011-08-04 ENCOUNTER — Encounter (HOSPITAL_COMMUNITY): Payer: Self-pay | Admitting: Orthopedic Surgery

## 2011-08-04 NOTE — Progress Notes (Signed)
Pt refuses to wear arm sling after being educated on the importance of keeping her right arm in a sling. Pt is acting  confused after taking pain medicine and ambient. Pt was reoriented and will be continuously  Monitored for her safety.

## 2011-08-04 NOTE — Progress Notes (Signed)
Patient ID: Kara [name], female   DOB: 06-29-1955, 56 y.o.   MRN: [id number]     Subjective:  Patient reports pain as mild to moderate.  Has some confusion last night she states that she is ready to go home and get back on her home med regimen.  Objective:   VITALS:   Filed Vitals:   08/03/11 0720 08/03/11 1400 08/03/11 2135 08/04/11 0555  BP: 104/56 114/73 103/61 112/62  Pulse: 94 95 93 85  Temp: 98.3 F (36.8 C) 98.6 F (37 C) 98.2 F (36.8 C) 98 F (36.7 C)  TempSrc:      Resp: 18 16 18 18   SpO2: 92% 94% 98% 100%    ABD soft Sensation intact distally Dorsiflexion/Plantar flexion intact Incision: dressing C/D/I and no drainage  LABS  No results found for this or any previous visit (from the past 24 hour(s)).  No results found.  Assessment/Plan: 3 Days Post-Op   Principal Problem:  *Other specified rheumatoid arthritis, right shoulder   Advance diet Up with therapy Discharge home with home health   Haskel Khan 08/04/2011, 8:10 AM   Teryl Lucy, MD [phone] pager

## 2011-08-09 ENCOUNTER — Telehealth: Payer: Self-pay | Admitting: *Deleted

## 2011-08-09 NOTE — Telephone Encounter (Signed)
PATIENT CALLED IN AND CONFIRMED OVER THE PHONE [facility] ON 08-16-2011 AT 3:00PM

## 2011-08-16 ENCOUNTER — Ambulatory Visit: Payer: 59 | Admitting: Family

## 2011-09-26 ENCOUNTER — Other Ambulatory Visit: Payer: Self-pay | Admitting: Physical Medicine and Rehabilitation

## 2011-09-26 DIAGNOSIS — M549 Dorsalgia, unspecified: Secondary | ICD-10-CM

## 2011-09-29 ENCOUNTER — Ambulatory Visit
Admission: RE | Admit: 2011-09-29 | Discharge: 2011-09-29 | Disposition: A | Discharge status: Home / Self Care | Payer: 59 | Source: Ambulatory Visit | Attending: Physical Medicine and Rehabilitation | Admitting: Physical Medicine and Rehabilitation

## 2011-09-29 VITALS — BP 128/78 | HR 78

## 2011-09-29 DIAGNOSIS — M545 Low back pain, unspecified: Secondary | ICD-10-CM

## 2011-09-29 DIAGNOSIS — M549 Dorsalgia, unspecified: Secondary | ICD-10-CM

## 2011-09-29 MED ORDER — DIAZEPAM 5 MG PO TABS
10.0000 mg | ORAL_TABLET | Freq: Once | ORAL | Status: AC
  Administered 2011-09-29: 10 mg via ORAL

## 2011-09-29 MED ORDER — OXYCODONE-ACETAMINOPHEN 5-325 MG PO TABS
2.0000 | ORAL_TABLET | Freq: Once | ORAL | Status: AC
  Administered 2011-09-29: 2 via ORAL

## 2011-09-29 MED ORDER — IOHEXOL 180 MG/ML  SOLN
15.0000 mL | Freq: Once | INTRAMUSCULAR | Status: AC | PRN
  Administered 2011-09-29: 15 mL via INTRATHECAL

## 2011-09-29 NOTE — Progress Notes (Signed)
Patient states she has been off Effexor, Cymbalta, Abilify, and Vyvanse for the past two days.  jkl

## 2011-10-15 ENCOUNTER — Emergency Department (HOSPITAL_COMMUNITY)
Admission: EM | Admit: 2011-10-15 | Discharge: 2011-10-16 | Disposition: A | Discharge status: Home / Self Care | Payer: 59 | Attending: Emergency Medicine | Admitting: Emergency Medicine

## 2011-10-15 ENCOUNTER — Encounter (HOSPITAL_COMMUNITY): Payer: Self-pay | Admitting: Family Medicine

## 2011-10-15 DIAGNOSIS — W010XXA Fall on same level from slipping, tripping and stumbling without subsequent striking against object, initial encounter: Secondary | ICD-10-CM | POA: Insufficient documentation

## 2011-10-15 DIAGNOSIS — M069 Rheumatoid arthritis, unspecified: Secondary | ICD-10-CM | POA: Insufficient documentation

## 2011-10-15 DIAGNOSIS — S40011A Contusion of right shoulder, initial encounter: Secondary | ICD-10-CM

## 2011-10-15 DIAGNOSIS — Y9301 Activity, walking, marching and hiking: Secondary | ICD-10-CM | POA: Insufficient documentation

## 2011-10-15 DIAGNOSIS — Y998 Other external cause status: Secondary | ICD-10-CM | POA: Insufficient documentation

## 2011-10-15 DIAGNOSIS — S40019A Contusion of unspecified shoulder, initial encounter: Secondary | ICD-10-CM | POA: Insufficient documentation

## 2011-10-15 DIAGNOSIS — M129 Arthropathy, unspecified: Secondary | ICD-10-CM | POA: Insufficient documentation

## 2011-10-15 DIAGNOSIS — IMO0001 Reserved for inherently not codable concepts without codable children: Secondary | ICD-10-CM | POA: Insufficient documentation

## 2011-10-15 NOTE — ED Notes (Signed)
Pt sts slipped and fell PTA and hit head on the wall. Since she has had pounding HA and nausea. sts also pain in right shoulder. Hx of shoulder replacement.

## 2011-10-16 ENCOUNTER — Other Ambulatory Visit (HOSPITAL_COMMUNITY): Payer: Self-pay | Admitting: Emergency Medicine

## 2011-10-16 ENCOUNTER — Ambulatory Visit (HOSPITAL_COMMUNITY)
Admission: RE | Admit: 2011-10-16 | Discharge: 2011-10-16 | Disposition: A | Discharge status: Home / Self Care | Payer: 59 | Source: Ambulatory Visit | Attending: Emergency Medicine | Admitting: Emergency Medicine

## 2011-10-16 DIAGNOSIS — W19XXXA Unspecified fall, initial encounter: Secondary | ICD-10-CM

## 2011-10-16 DIAGNOSIS — M25519 Pain in unspecified shoulder: Secondary | ICD-10-CM | POA: Insufficient documentation

## 2011-10-16 MED FILL — Morphine Sulfate Inj 4 MG/ML: INTRAMUSCULAR | Qty: 1 | Status: AC

## 2011-10-16 NOTE — ED Notes (Signed)
See downtime charting. 

## 2011-10-18 NOTE — ED Provider Notes (Signed)
History     CSN: [id number]  Arrival date & time 10/15/11  1618   None     Chief Complaint  Patient presents with  . Fall    (Consider location/radiation/quality/duration/timing/severity/associated sxs/prior treatment) HPI Pt slipped and fell from standing while walking in the rain. She struck her head and R shoulder. No LOC, neck pain, deformity. Full strength and sensation. Complains of mild HA. Pt on chronic pain meds and is asking for narcotics.  Past Medical History  Diagnosis Date  . Fibromyalgia   . 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 Arthritis   . Rheumatoid arthritis     autoimmune arthritis; methotrexate once/week  . Depression     takes meds daily  . Post-nasal drip     hx of  . Other specified rheumatoid arthritis, right shoulder 08/01/2011    Past Surgical History  Procedure Date  . Shoulder surgery 2012    right  . Back surgery 2011    lower back fusion l4-5, s1  . Bladder surgery 2009  . Tubal ligation 1988  . Breast enhancement surgery 2003  . Liposuction 2003    abdominal  . Tonsillectomy 1987  . Total shoulder arthroplasty 08/01/2011    Procedure: TOTAL SHOULDER ARTHROPLASTY;  Surgeon: Eulas Post, MD;  Location: [facility];  Service: Orthopedics;  Laterality: Right;  Right total shoulder arthroplasty    Family History  Problem Relation Age of Onset  . Anesthesia problems Neg Hx   . Hypotension Neg Hx   . Malignant hyperthermia Neg Hx   . Pseudochol deficiency Neg Hx     History  Substance Use Topics  . Smoking status: Never Smoker   . Smokeless tobacco: Not on file  . Alcohol Use: No    OB History    Grav Para Term Preterm Abortions TAB SAB Ect Mult Living                  Review of Systems  Constitutional: Negative for fever.  HENT: Negative for facial swelling, neck pain and neck stiffness.   Respiratory: Negative for shortness of breath.   Cardiovascular: Negative for chest pain.    Gastrointestinal: Negative for vomiting and abdominal pain.  Musculoskeletal: Positive for arthralgias. Negative for gait problem.  Skin: Negative for rash and wound.  Neurological: Positive for headaches. Negative for syncope, weakness and numbness.    Allergies  Aspirin; Ibuprofen; Ativan; and Tramadol hcl  Home Medications   Current Outpatient Rx  Name Route Sig Dispense Refill  . AMPHETAMINE-DEXTROAMPHETAMINE 10 MG PO TABS Oral Take 10 mg by mouth as directed. As directed by physician at the [facility], [place], [place], [place]    . CLONAZEPAM 0.5 MG PO TABS Oral Take 0.5 mg by mouth 3 (three) times daily as needed. For anxiety.    . FLUOXETINE HCL 20 MG PO CAPS Oral Take 20 mg by mouth daily.    [name] FOLIC ACID 1 MG PO TABS Oral Take 2 mg by mouth daily.    [name] METHOTREXATE SODIUM (PF) 50 MG/2ML IJ SOLN Injection Inject 10 mg as directed once a week. On Wednesdays. 0.4 ML = 10 MG    . OXYCODONE HCL ER 10 MG PO TB12 Oral Take 10 mg by mouth every 4 (four) hours as needed.    . OXYCODONE HCL ER 10 MG PO TB12 Oral Take 1 tablet (10 mg total) by mouth every 12 (twelve) hours. 40  tablet 0  . PROMETHAZINE HCL 25 MG PO TABS Oral Take 1 tablet (25 mg total) by mouth every 6 (six) hours as needed for nausea. 30 tablet 0  . VENLAFAXINE HCL ER 150 MG PO CP24 Oral Take 150 mg by mouth 2 (two) times daily.    [name] ZOLPIDEM TARTRATE 10 MG PO TABS Oral Take 10 mg by mouth at bedtime.       BP 131/84  Pulse 88  Temp 99 F (37.2 C) (Oral)  Resp 20  SpO2 95%  Physical Exam  Nursing note and vitals reviewed. Constitutional: She is oriented to person, place, and time. She appears well-developed and well-nourished. No distress.  HENT:  Head: Normocephalic and atraumatic.  Mouth/Throat: Oropharynx is clear and moist.  Eyes: EOM are normal. Pupils are equal, round, and reactive to light.  Neck: Normal range of motion. Neck supple.  Cardiovascular: Normal rate and regular rhythm.    Pulmonary/Chest: Effort normal and breath sounds normal. No respiratory distress. She has no wheezes. She has no rales.  Abdominal: Soft. Bowel sounds are normal. She exhibits no distension. There is no tenderness. There is no rebound and no guarding.  Musculoskeletal: Normal range of motion. She exhibits no edema and no tenderness.       FROM of R shoulder. Minor TTP over lateral deltoid. No evidence of trauma. 2+ radial pulse  Neurological: She is alert and oriented to person, place, and time.       5/5 motor, sensation intact  Skin: Skin is warm and dry. No rash noted. No erythema.  Psychiatric: She has a normal mood and affect. Her behavior is normal.    ED Course  Procedures (including critical care time)  Labs Reviewed - No data to display No results found.   1. Contusion of right shoulder       MDM  No serious injury suspected. D/C home and return for worsening symptoms        Loren Racer, MD 10/18/11 [phone]

## 2011-10-26 ENCOUNTER — Other Ambulatory Visit: Payer: Self-pay | Admitting: Family Medicine

## 2011-10-26 DIAGNOSIS — Z1231 Encounter for screening mammogram for malignant neoplasm of breast: Secondary | ICD-10-CM

## 2011-10-26 DIAGNOSIS — Z9882 Breast implant status: Secondary | ICD-10-CM

## 2011-10-31 ENCOUNTER — Other Ambulatory Visit: Payer: Self-pay | Admitting: Orthopedic Surgery

## 2011-11-02 NOTE — H&P (Signed)
Arlyn Bumpus is an 56 y.o. female.   Chief Complaint: c/o mucoid cyst left index finger HPI: Barba Solt presents for a consult regarding her left index finger mucoid cyst and nail deformity. 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 She is s/p right shoulder implant arthroplasty May 2013. Her primary care physician is Carilyn Goodpasture. She is 5'4", 154 lbs. She has not been using any prescription medication for her finger predicament. She is allergic to aspirin with a type I sensitivity response. She also avoids ibuprofen. She can take Meloxicam. She is followed by Zenovia Jordan from a rheumatology management standpoint.    Past Medical History  Diagnosis Date  . Fibromyalgia   . 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 Arthritis   . Rheumatoid arthritis     autoimmune arthritis; methotrexate once/week  . Depression     takes meds daily  . Post-nasal drip     hx of  . Other specified rheumatoid arthritis, right shoulder 08/01/2011    Past Surgical History  Procedure Date  . Shoulder surgery 2012    right  . Back surgery 2011    lower back fusion l4-5, s1  . Bladder surgery 2009  . Tubal ligation 1988  . Breast enhancement surgery 2003  . Liposuction 2003    abdominal  . Tonsillectomy 1987  . Total shoulder arthroplasty 08/01/2011    Procedure: TOTAL SHOULDER ARTHROPLASTY;  Surgeon: Eulas Post, MD;  Location: [facility] OR;  Service: Orthopedics;  Laterality: Right;  Right total shoulder arthroplasty    Family History  Problem Relation Age of Onset  . Anesthesia problems Neg Hx   . Hypotension Neg Hx   . Malignant hyperthermia Neg Hx   . Pseudochol deficiency Neg Hx    Social History:  reports that she has never smoked. She does not have any smokeless tobacco history on file. She reports that she uses illicit drugs (Marijuana). She reports that she does not drink alcohol.  Allergies:  Allergies  Allergen  Reactions  . Aspirin Anaphylaxis  . Ibuprofen Anaphylaxis  . Ativan (Lorazepam) Other (See Comments)    Makes agitated, combative   . Tramadol Hcl Itching and Rash    No prescriptions prior to admission    No results found for this or any previous visit (from the past 48 hour(s)).  No results found.   Pertinent items are noted in HPI.  There were no vitals taken for this visit.  General appearance: alert Head: Normocephalic, without obvious abnormality Neck: supple, symmetrical, trachea midline Resp: clear to auscultation bilaterally Cardio: regular rate and rhythm GI: normal findings: bowel sounds normal Extremities: She has full ROM of her elbow, forearm, wrist and fingers. She has Heberden's and Bouchard's nodes. She has a 5 mm in diameter mucoid cyst over the dorsal nail fold of her left index finger. She has nail grooving and ridging. She has no impairment of the PIP or MP joints.  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 She has marginal osteophyte formation. Pulses: 2+ and symmetric Skin: normal Neurologic: Grossly normal  Assessment/Plan Impression: Left index finger mucoid cyst at DIP joint.  Plan: To the OR for excision of mucoid cyst with DIP joint debridement left index finger.The procedure, risks,benefits and post-op course were discussed with the patient at length and she was in agreement with the plan.   DASNOIT,Royston Bekele J 11/02/2011, 3:54 PM  H&P documentation: 11/03/2011  -History and Physical Reviewed  -Patient has been re-examined  -No change in the plan of care  Wyn Forster, MD

## 2011-11-03 ENCOUNTER — Ambulatory Visit (HOSPITAL_BASED_OUTPATIENT_CLINIC_OR_DEPARTMENT_OTHER)
Admission: RE | Admit: 2011-11-03 | Discharge: 2011-11-03 | Disposition: A | Discharge status: Home / Self Care | Payer: 59 | Source: Ambulatory Visit | Attending: Orthopedic Surgery | Admitting: Orthopedic Surgery

## 2011-11-03 ENCOUNTER — Encounter (HOSPITAL_BASED_OUTPATIENT_CLINIC_OR_DEPARTMENT_OTHER)
Admission: RE | Disposition: A | Discharge status: Home / Self Care | Payer: Self-pay | Source: Ambulatory Visit | Attending: Orthopedic Surgery

## 2011-11-03 DIAGNOSIS — F329 Major depressive disorder, single episode, unspecified: Secondary | ICD-10-CM | POA: Insufficient documentation

## 2011-11-03 DIAGNOSIS — IMO0001 Reserved for inherently not codable concepts without codable children: Secondary | ICD-10-CM | POA: Insufficient documentation

## 2011-11-03 DIAGNOSIS — M069 Rheumatoid arthritis, unspecified: Secondary | ICD-10-CM | POA: Insufficient documentation

## 2011-11-03 DIAGNOSIS — L609 Nail disorder, unspecified: Secondary | ICD-10-CM | POA: Insufficient documentation

## 2011-11-03 DIAGNOSIS — M19049 Primary osteoarthritis, unspecified hand: Secondary | ICD-10-CM | POA: Insufficient documentation

## 2011-11-03 DIAGNOSIS — F3289 Other specified depressive episodes: Secondary | ICD-10-CM | POA: Insufficient documentation

## 2011-11-03 DIAGNOSIS — M6740 Ganglion, unspecified site: Secondary | ICD-10-CM | POA: Insufficient documentation

## 2011-11-03 HISTORY — PX: MASS EXCISION: SHX2000

## 2011-11-03 SURGERY — MINOR EXCISION OF MASS
Anesthesia: LOCAL | Site: Finger | Laterality: Left | Wound class: Clean

## 2011-11-03 MED ORDER — CEPHALEXIN 500 MG PO CAPS: 500.0000 mg | ORAL_CAPSULE | Freq: Three times a day (TID) | ORAL | Status: AC

## 2011-11-03 MED ORDER — CHLORHEXIDINE GLUCONATE 4 % EX LIQD: 60.0000 mL | Freq: Once | CUTANEOUS | Status: DC

## 2011-11-03 MED ORDER — LIDOCAINE HCL 2 % IJ SOLN
INTRAMUSCULAR | Status: DC | PRN
  Administered 2011-11-03: 4 mL

## 2011-11-03 MED ORDER — HYDROCODONE-ACETAMINOPHEN 5-325 MG PO TABS: ORAL_TABLET | ORAL | Status: AC

## 2011-11-03 SURGICAL SUPPLY — 35 items
BANDAGE ADHESIVE 1X3 (GAUZE/BANDAGES/DRESSINGS) IMPLANT
BLADE SURG 15 STRL LF DISP TIS (BLADE) ×1 IMPLANT
BLADE SURG 15 STRL SS (BLADE) ×1
BNDG COHESIVE 1X5 TAN STRL LF (GAUZE/BANDAGES/DRESSINGS) IMPLANT
BNDG ELASTIC 2 VLCR STRL LF (GAUZE/BANDAGES/DRESSINGS) IMPLANT
BNDG ESMARK 4X9 LF (GAUZE/BANDAGES/DRESSINGS) IMPLANT
BRUSH SCRUB EZ PLAIN DRY (MISCELLANEOUS) ×2 IMPLANT
CLOTH BEACON ORANGE TIMEOUT ST (SAFETY) ×2 IMPLANT
CORDS BIPOLAR (ELECTRODE) IMPLANT
COVER MAYO STAND STRL (DRAPES) IMPLANT
CUFF TOURNIQUET SINGLE 18IN (TOURNIQUET CUFF) ×2 IMPLANT
DECANTER SPIKE VIAL GLASS SM (MISCELLANEOUS) IMPLANT
DRAIN PENROSE 1/2X12 LTX STRL (WOUND CARE) IMPLANT
DRAPE SURG 17X23 STRL (DRAPES) ×2 IMPLANT
GAUZE SPONGE 4X4 12PLY STRL LF (GAUZE/BANDAGES/DRESSINGS) ×4 IMPLANT
GAUZE XEROFORM 1X8 LF (GAUZE/BANDAGES/DRESSINGS) ×2 IMPLANT
GLOVE BIOGEL M STRL SZ7.5 (GLOVE) ×2 IMPLANT
GLOVE ORTHO TXT STRL SZ7.5 (GLOVE) ×2 IMPLANT
GLOVE SKINSENSE NS SZ7.0 (GLOVE) ×1
GLOVE SKINSENSE STRL SZ7.0 (GLOVE) ×1 IMPLANT
GOWN PREVENTION PLUS XLARGE (GOWN DISPOSABLE) ×2 IMPLANT
NEEDLE 27GAX1X1/2 (NEEDLE) ×2 IMPLANT
PACK BASIN DAY SURGERY FS (CUSTOM PROCEDURE TRAY) ×2 IMPLANT
PADDING CAST ABS 4INX4YD NS (CAST SUPPLIES) ×1
PADDING CAST ABS COTTON 4X4 ST (CAST SUPPLIES) ×1 IMPLANT
SPONGE GAUZE 4X4 12PLY (GAUZE/BANDAGES/DRESSINGS) ×2 IMPLANT
STOCKINETTE 4X48 STRL (DRAPES) ×2 IMPLANT
SUT ETHILON 5 0 P 3 18 (SUTURE) ×1
SUT NYLON ETHILON 5-0 P-3 1X18 (SUTURE) ×1 IMPLANT
SYR 3ML 23GX1 SAFETY (SYRINGE) IMPLANT
SYR CONTROL 10ML LL (SYRINGE) ×2 IMPLANT
TOWEL OR 17X24 6PK STRL BLUE (TOWEL DISPOSABLE) ×2 IMPLANT
TRAY DSU PREP LF (CUSTOM PROCEDURE TRAY) ×2 IMPLANT
UNDERPAD 30X30 INCONTINENT (UNDERPADS AND DIAPERS) ×2 IMPLANT
WATER STERILE IRR 1000ML POUR (IV SOLUTION) ×2 IMPLANT

## 2011-11-03 NOTE — Op Note (Signed)
[id number] 

## 2011-11-03 NOTE — Brief Op Note (Signed)
11/03/2011  9:48 AM  PATIENT:  Kara [name]  56 y.o. female  PRE-OPERATIVE DIAGNOSIS:  left index mucoid cyst,dip joint djd  POST-OPERATIVE DIAGNOSIS:  left index mucoid cyst, dip joint djd  PROCEDURE:  Arthrotomy and debridement of left index dip joint with excision of dorsal nail fold myxoid cyst    SURGEON:  Surgeon(s) and Role:    * Wyn Forster., MD - Primary  PHYSICIAN ASSISTANT:   ASSISTANTS: Mallory Shirk.A-C    ANESTHESIA:   local  EBL:     BLOOD ADMINISTERED:none  DRAINS: none   LOCAL MEDICATIONS USED:  XYLOCAINE   SPECIMEN:  No Specimen  DISPOSITION OF SPECIMEN:  N/A  COUNTS:  YES  TOURNIQUET:  * Missing tourniquet times found for documented tourniquets in log:  53014 *  DICTATION: .Other Dictation: Dictation Number [id number]  PLAN OF CARE: Discharge to home after PACU  PATIENT DISPOSITION:  PACU - hemodynamically stable.

## 2011-11-06 ENCOUNTER — Encounter (HOSPITAL_BASED_OUTPATIENT_CLINIC_OR_DEPARTMENT_OTHER): Payer: Self-pay | Admitting: Orthopedic Surgery

## 2011-11-06 NOTE — Op Note (Signed)
NAMEDENETTA, Kara [name]                 ACCOUNT NO.:  [id number]  MEDICAL RECORD NO.:  [id number]  LOCATION:                                 FACILITY:  PHYSICIAN:  Katy Fitch. Tyniya Kuyper, M.D. 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  OPERATION: 1. Arthrotomy of left index finger distal interphalangeal joint,     dorsal radial and dorsal ulnar with removal of loose bodies,     cartilage fragments, synovectomy, and irrigation. 2. Resection of dorsal nail fold mucoid cyst in decompressing nail     mechanism.  OPERATING SURGEON:  Katy Fitch. Khali Perella, MD.  ASSISTANT:  Marveen Reeks. Dasnoit, PA-C.  ANESTHESIA:  Lidocaine 2% metacarpal head-level block of left index finger.  This was performed as a minor operating room procedure.  INDICATIONS:  Kara [name] is a 56 year old woman who referred through the courtesy of Dr. Teryl Lucy for evaluation and management of a myxoid cyst causing pressure on the nail fold, leading to a full-length nail deformity involving the left index finger.  Dr. Dion Saucier had performed a right shoulder implant arthroplasty on Kara [name].  She has background of rheumatoid arthritis and chronic pain syndrome.  She had an excellent result following her implant arthroplasty.  During her aftercare, she noted a mucoid cyst.  Dr. Dion Saucier referred her for Hand Surgery consult.  On exam, she was noted to have a 4-mm in diameter mucoid cyst, presenting at the dorsal central nail fold.  She had a full length nail groove.  Plain x-rays of the finger demonstrated degenerative arthritis of the  distal interphalangeal joint and a small loose body in the dorsal ulnar aspect of the DIP joint.  We advised Kara [name] that the management of myxoid cyst and nail deformity was an art.  Experience has shown that dependable correction of the myxoid cyst and nail deformity can be accomplished by a joint debridement, loose body removal, and synovectomy.  After informed consent, she was brought to the operating room at this time.  PROCEDURE:  Kara [name] was brought to room 2 of the [facility] and placed supine position on the operating table.  Following Betadine prep of her palm and base of index finger, a metacarpal head-level digital block was placed with 2% plain lidocaine. After few moments, excellent anesthesia was achieved.  The left hand and arm were then prepped with Betadine soap and solution, sterilely draped with sterile towels.  Following a routine surgical time-out, during which we had knowledge her ALLERGIES TO ASPIRIN, IBUPROFEN, AND INTOLERANCE OF TRAMADOL.  We proceeded to exsanguinate her left index finger with a gauze wrap and placed a half-inch Penrose drain at the base of the fingers with digital tourniquet.  Procedure commenced with a curvilinear incision over the dorsoulnar aspect of the joint.  Subcutaneous tissues  were carefully divided revealing a small myxoid cyst abutting from the joint capsule.  This was circumferentially dissected and removed by capsulectomy, followed by removal of small loose body beneath the ulnar collateral ligament.  The dorsal ulnar aspect of the joint was then debrided of synovium and a marginal osteophyte at the base of distal phalanx removed with the micro rongeur.  Attention then directed to the dorsoradial aspect of the index finger.  A curvilinear incision was fashioned at the dorsoradial aspect of the finger followed by identification of a cyst neck that extended to the dorsal nail fold.  The neck of the cyst and the  contents of the cyst were decompressed, followed by resection of the joint capsule on the dorsoradial aspect of the joint between the terminal extensor tendon slip and the radial collateral ligament.  A micro-curette was used to remove an osteophyte at the base of the distal phalanx, followed by debridement, synovectomy, and irrigation of the joint through-and- through with a 19-gauge blunt dental needle and sterile saline.  The wounds were then repaired with trauma sutures of 5-0 nylon.  Kara [name] was placed in compressive dressing of Xeroflo sterile gauze and a Coban finger dressing.  She tolerated this procedure well.  She will be discharged with prescriptions for hydrocodone 5/325, 1 p.o. q.4-6 hours p.r.n. pain, 20 tablets, without refill; also due to joint injury, she is provided Keflex 5 mg 1 p.o. q.8 hours for 4 days as a prophylactic antibiotic.     Katy Fitch Dalessandro Baldyga, M.D.     RVS/MEDQ  D:  11/03/2011  T:  11/04/2011  Job:  [id number]  cc:   Eulas Post, MD

## 2011-12-13 ENCOUNTER — Ambulatory Visit (INDEPENDENT_AMBULATORY_CARE_PROVIDER_SITE_OTHER): Payer: 59 | Admitting: Family Medicine

## 2011-12-13 ENCOUNTER — Encounter: Payer: Self-pay | Admitting: Family Medicine

## 2011-12-13 VITALS — BP 110/70 | HR 100 | Temp 98.9°F | Resp 12 | Ht 63.25 in | Wt 164.0 lb

## 2011-12-13 DIAGNOSIS — IMO0001 Reserved for inherently not codable concepts without codable children: Secondary | ICD-10-CM

## 2011-12-13 DIAGNOSIS — F32A Depression, unspecified: Secondary | ICD-10-CM

## 2011-12-13 DIAGNOSIS — F329 Major depressive disorder, single episode, unspecified: Secondary | ICD-10-CM

## 2011-12-13 DIAGNOSIS — F988 Other specified behavioral and emotional disorders with onset usually occurring in childhood and adolescence: Secondary | ICD-10-CM

## 2011-12-13 DIAGNOSIS — Z87442 Personal history of urinary calculi: Secondary | ICD-10-CM

## 2011-12-13 DIAGNOSIS — Z8709 Personal history of other diseases of the respiratory system: Secondary | ICD-10-CM | POA: Insufficient documentation

## 2011-12-13 DIAGNOSIS — Z8619 Personal history of other infectious and parasitic diseases: Secondary | ICD-10-CM

## 2011-12-13 DIAGNOSIS — F419 Anxiety disorder, unspecified: Secondary | ICD-10-CM | POA: Insufficient documentation

## 2011-12-13 DIAGNOSIS — F3289 Other specified depressive episodes: Secondary | ICD-10-CM

## 2011-12-13 DIAGNOSIS — M797 Fibromyalgia: Secondary | ICD-10-CM

## 2011-12-13 DIAGNOSIS — M069 Rheumatoid arthritis, unspecified: Secondary | ICD-10-CM

## 2011-12-13 NOTE — Progress Notes (Signed)
Subjective:    Patient ID: Kara [name], female    DOB: [birth year], 56 y.o.   MRN: [id number]  HPI   Here to establish care. Patient has history of mild intermittent asthma in childhood but no problems in several years, rheumatoid arthritis, fibromyalgia, history of recurrent depression, past history of kidney stones, attention deficit disorder, chronic anxiety. She is followed by rheumatology regarding her rheumatoid arthritis and has seen psychiatrist regarding her ADD and recurrent depression. She's had previous right shoulder replacement and is anticipitating left shoulder replacement soon. 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 She had multiple other surgeries as indicated elsewhere. She currently receives intermittent hydrocodone per her rheumatologist for her arthritic pains.  Family history significant for mother with arthritis and breast cancer history. Mother had coronary disease. Father with hypertension.  Patient is married. Nonsmoker. No alcohol use. Tetanus last year. Colonoscopy 2 years ago. Patient refuses Pneumovax and flu vaccines  Past Medical History  Diagnosis Date  . Fibromyalgia   . 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 Arthritis   . Rheumatoid arthritis     autoimmune arthritis; methotrexate once/week  . Depression     takes meds daily  . [name]-nasal drip     hx of  . Other specified rheumatoid arthritis, right shoulder 08/01/2011  . Asthma   . Allergy   . Chronic kidney disease   . Urinary incontinence    Past Surgical History  Procedure Date  . Shoulder surgery 2012    right  . Back surgery 2011    lower back fusion l4-5, s1  . Bladder surgery 2009  . Tubal ligation 1988  . Breast enhancement surgery 2003  . Liposuction 2003    abdominal  . Tonsillectomy 1987  . Total shoulder arthroplasty 08/01/2011    Procedure: TOTAL SHOULDER ARTHROPLASTY;  Surgeon: Kara Post, MD;  Location: [facility] OR;  Service: Orthopedics;   Laterality: Right;  Right total shoulder arthroplasty  . Mass excision 11/03/2011    Procedure: MINOR EXCISION OF MASS;  Surgeon: Wyn Forster., MD;  Location: [facility];  Service: Orthopedics;  Laterality: Left;  debride IP joint, cyst excision left index    reports that she has never smoked. She does not have any smokeless tobacco history on file. She reports that she uses illicit drugs (Marijuana). She reports that she does not drink alcohol. family history includes Arthritis in her mother; COPD in her father; Cancer in her mother; Cancer (age of onset:27) in her maternal aunt; Heart disease in her mother; and Hypertension in her father.  There is no history of Anesthesia problems, and Hypotension, and Malignant hyperthermia, and Pseudochol deficiency, . Allergies  Allergen Reactions  . Aspirin Anaphylaxis  . Ibuprofen Anaphylaxis  . Ativan (Lorazepam) Other (See Comments)    Makes agitated, combative   . Tramadol Hcl Itching and Rash      Review of Systems  Constitutional: Negative for fever, chills, appetite change and unexpected weight change.  Respiratory: Negative for cough and shortness of breath.   Cardiovascular: Negative for chest pain.  Gastrointestinal: Negative for abdominal pain.  Genitourinary: Negative for dysuria.  Musculoskeletal: Positive for arthralgias. Negative for myalgias.  Neurological: Negative for dizziness and weakness.       Objective:   Physical Exam  Constitutional: She appears well-developed and well-nourished.  HENT:  Right Ear: External ear normal.  Left Ear: External ear normal.  Mouth/Throat: Oropharynx is clear and moist.  Neck: Neck supple. No thyromegaly present.  Cardiovascular: Normal rate and regular rhythm.   Pulmonary/Chest: Effort normal and breath sounds normal. No respiratory distress. She has no wheezes. She has no rales.  Musculoskeletal: She exhibits no edema.  Lymphadenopathy:    She has no cervical  adenopathy.          Assessment & Plan:  #1 rheumatoid arthritis followed by rheumatology with Ranae Plumber just initiated. #2 history of fibromyalgia  #3 history of recurrent depression currently stable. #4 history of kidney stones  #5 history of mild intermittent asthma currently stable off medications #6 history of ADD  #7 health maintenance. Flu vaccine and Pneumovax recommended especially with immunosuppressants for rheumatoid arthritis but she declines both. Schedule complete physical

## 2011-12-13 NOTE — Patient Instructions (Addendum)
Consider complete physical at some point later this year. 

## 2011-12-23 ENCOUNTER — Encounter (HOSPITAL_COMMUNITY): Payer: Self-pay | Admitting: *Deleted

## 2011-12-23 DIAGNOSIS — R52 Pain, unspecified: Secondary | ICD-10-CM | POA: Insufficient documentation

## 2011-12-23 NOTE — ED Notes (Signed)
The pts med dosage was increased and since the past 2-3 days she has all over body pain.

## 2011-12-24 ENCOUNTER — Emergency Department (HOSPITAL_COMMUNITY)
Admission: EM | Admit: 2011-12-24 | Discharge: 2011-12-24 | Disposition: A | Discharge status: Home / Self Care | Payer: 59 | Attending: Emergency Medicine | Admitting: Emergency Medicine

## 2011-12-24 ENCOUNTER — Emergency Department (HOSPITAL_COMMUNITY): Payer: 59

## 2011-12-24 ENCOUNTER — Encounter (HOSPITAL_COMMUNITY): Payer: Self-pay | Admitting: Emergency Medicine

## 2011-12-24 DIAGNOSIS — Z8261 Family history of arthritis: Secondary | ICD-10-CM | POA: Insufficient documentation

## 2011-12-24 DIAGNOSIS — Z803 Family history of malignant neoplasm of breast: Secondary | ICD-10-CM | POA: Insufficient documentation

## 2011-12-24 DIAGNOSIS — N189 Chronic kidney disease, unspecified: Secondary | ICD-10-CM | POA: Insufficient documentation

## 2011-12-24 DIAGNOSIS — F329 Major depressive disorder, single episode, unspecified: Secondary | ICD-10-CM | POA: Insufficient documentation

## 2011-12-24 DIAGNOSIS — M069 Rheumatoid arthritis, unspecified: Secondary | ICD-10-CM | POA: Insufficient documentation

## 2011-12-24 DIAGNOSIS — Z8489 Family history of other specified conditions: Secondary | ICD-10-CM | POA: Insufficient documentation

## 2011-12-24 DIAGNOSIS — Z8249 Family history of ischemic heart disease and other diseases of the circulatory system: Secondary | ICD-10-CM | POA: Insufficient documentation

## 2011-12-24 DIAGNOSIS — J45909 Unspecified asthma, uncomplicated: Secondary | ICD-10-CM | POA: Insufficient documentation

## 2011-12-24 DIAGNOSIS — IMO0001 Reserved for inherently not codable concepts without codable children: Secondary | ICD-10-CM | POA: Insufficient documentation

## 2011-12-24 DIAGNOSIS — Z888 Allergy status to other drugs, medicaments and biological substances status: Secondary | ICD-10-CM | POA: Insufficient documentation

## 2011-12-24 DIAGNOSIS — M25519 Pain in unspecified shoulder: Secondary | ICD-10-CM | POA: Insufficient documentation

## 2011-12-24 DIAGNOSIS — F3289 Other specified depressive episodes: Secondary | ICD-10-CM | POA: Insufficient documentation

## 2011-12-24 MED ORDER — ONDANSETRON HCL 4 MG/2ML IJ SOLN
4.0000 mg | Freq: Once | INTRAMUSCULAR | Status: AC
  Administered 2011-12-24: 4 mg via INTRAVENOUS
  Filled 2011-12-24: qty 2

## 2011-12-24 MED ORDER — OXYCODONE-ACETAMINOPHEN 5-325 MG PO TABS: 1.0000 | ORAL_TABLET | Freq: Four times a day (QID) | ORAL | Status: DC | PRN

## 2011-12-24 MED ORDER — HYDROMORPHONE HCL PF 1 MG/ML IJ SOLN
1.0000 mg | Freq: Once | INTRAMUSCULAR | Status: AC
  Administered 2011-12-24: 1 mg via INTRAVENOUS
  Filled 2011-12-24: qty 1

## 2011-12-24 NOTE — ED Provider Notes (Addendum)
History     CSN: [id number]  Arrival date & time 12/24/11  1478   First MD Initiated Contact with Patient 12/24/11 [phone]      Chief Complaint  Patient presents with  . Generalized Body Aches    (Consider location/radiation/quality/duration/timing/severity/associated sxs/prior treatment) Patient is a 56 y.o. female presenting with shoulder pain. The history is provided by the patient.  Shoulder Pain This is a chronic problem. Episode onset: worse over the last few weeks. The problem occurs constantly. The problem has been gradually worsening. Associated symptoms comments: Due to the pain in the left shoulder causing pain in all the joints flaring her RA.[name] The symptoms are aggravated by bending and twisting (movement of the arms). Nothing relieves the symptoms. Treatments tried: She tried her new medications her doctor prescribed but they are not helping. The treatment provided no relief.    Past Medical History  Diagnosis Date  . Fibromyalgia   . 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 Arthritis   . Rheumatoid arthritis     autoimmune arthritis; methotrexate once/week  . Depression     takes meds daily  . Post-nasal drip     hx of  . Other specified rheumatoid arthritis, right shoulder 08/01/2011  . Asthma   . Allergy   . Chronic kidney disease   . Urinary incontinence     Past Surgical History  Procedure Date  . Shoulder surgery 2012    right  . Back surgery 2011    lower back fusion l4-5, s1  . Bladder surgery 2009  . Tubal ligation 1988  . Breast enhancement surgery 2003  . Liposuction 2003    abdominal  . Tonsillectomy 1987  . Total shoulder arthroplasty 08/01/2011    Procedure: TOTAL SHOULDER ARTHROPLASTY;  Surgeon: Eulas Post, MD;  Location: [facility] OR;  Service: Orthopedics;  Laterality: Right;  Right total shoulder arthroplasty  . Mass excision 11/03/2011    Procedure: MINOR EXCISION OF MASS;  Surgeon: Wyn Forster., MD;   Location: [facility];  Service: Orthopedics;  Laterality: Left;  debride IP joint, cyst excision left index    Family History  Problem Relation Age of Onset  . Anesthesia problems Neg Hx   . Hypotension Neg Hx   . Malignant hyperthermia Neg Hx   . Pseudochol deficiency Neg Hx   . Arthritis Mother   . Cancer Mother     breast  . Heart disease Mother   . COPD Father   . Hypertension Father   . Cancer Maternal Aunt 27    breast     History  Substance Use Topics  . Smoking status: Never Smoker   . Smokeless tobacco: Not on file  . Alcohol Use: No    OB History    Grav Para Term Preterm Abortions TAB SAB Ect Mult Living                  Review of Systems  All other systems reviewed and are negative.    Allergies  Aspirin; Ibuprofen; Ativan; and Tramadol hcl  Home Medications   Current Outpatient Rx  Name Route Sig Dispense Refill  . ARIPIPRAZOLE 5 MG PO TABS Oral Take 5 mg by mouth daily.    [name] CLONAZEPAM 1 MG PO TABS Oral Take 1 mg by mouth 2 (two) times daily as needed. For shoulder pain    . DULOXETINE HCL 60 MG PO CPEP Oral  Take 60 mg by mouth daily.    [name] VYVANSE 70 MG PO CAPS Oral Take 70 mg by mouth daily.     [name] ZOLPIDEM TARTRATE 10 MG PO TABS Oral Take 10 mg by mouth at bedtime as needed. For sleep    . VALACYCLOVIR HCL 1 G PO TABS Oral Take 1,000 mg by mouth daily as needed. Only take for cold sores      BP 159/95  Pulse 94  Temp 97.9 F (36.6 C) (Oral)  Resp 18  SpO2 99%  Physical Exam  Nursing note and vitals reviewed. Constitutional: She is oriented to person, place, and time. She appears well-developed and well-nourished. She appears distressed.       Tearful on exam  HENT:  Head: Normocephalic and atraumatic.  Mouth/Throat: Oropharynx is clear and moist.  Eyes: Conjunctivae normal and EOM are normal. Pupils are equal, round, and reactive to light.  Neck: Normal range of motion. Neck supple.  Cardiovascular: Normal rate, regular  rhythm and intact distal pulses.   No murmur heard. Pulmonary/Chest: Effort normal and breath sounds normal. No respiratory distress. She has no wheezes. She has no rales.  Abdominal: Soft. She exhibits no distension. There is no tenderness. There is no rebound and no guarding.  Musculoskeletal: She exhibits no edema and no tenderness.       Left shoulder: She exhibits decreased range of motion, tenderness and bony tenderness. She exhibits normal pulse and normal strength.       Pain in the left a.c. joint and pain with internal and external rotation of the left shoulder. 2+ pulses  Neurological: She is alert and oriented to person, place, and time.  Skin: Skin is warm and dry. No rash noted. No erythema.  Psychiatric: She has a normal mood and affect. Her behavior is normal.    ED Course  Procedures (including critical care time)  Labs Reviewed - No data to display Dg Shoulder Left  12/24/2011  *RADIOLOGY REPORT*  Clinical Data: Left shoulder pain, rheumatoid arthritis, mild chest  LEFT SHOULDER - 2+ VIEW  Comparison: Partial correlation with chest radiographs dated 12/08/2010  Findings: No fracture or dislocation is seen.  The joint spaces are preserved.  The visualized soft tissues are unremarkable.  Visualized left lung is notable for a partially calcified left upper lobe granuloma, grossly unchanged from 2012.  IMPRESSION: No acute osseous abnormality is seen.   Original Report Authenticated By: Charline Bills, M.D.      1. Shoulder pain       MDM   Patient with a history of RA, osteoarthritis, fibromyalgia, status post right shoulder replacement is here complaining of left shoulder replacement and severe worsening pain in the shoulders and joints.  Patient denies any infectious symptoms and states that she's been taking the medication her rheumatologist prescribed for her however it's not controlling the pain. Currently she is taking all nonnarcotic medication but states since she  started the most recent medication the pain has worsened. She attempted to see her Dr. on Friday however they were out of the office. Today she has normal vital signs and no acute findings on exam except for left shoulder pain that does not appear septic. Plain film of the shoulder ordered and patient given pain control   9:19 AM Patient is improved after pain medication and x-rays are unrevealing. Patient states she has an MRI scheduled for Monday and feels improved enough to go home at this point.     [employer] [facility],  MD 12/24/11 1610  Gwyneth Sprout, MD 12/24/11 [id number]

## 2011-12-24 NOTE — ED Notes (Signed)
No answer

## 2011-12-24 NOTE — ED Notes (Signed)
Called pt no answer °

## 2011-12-24 NOTE — ED Notes (Addendum)
Pt came earlier but left due to wait. She has returned and  c/o generalized fibromyalgia pain. AT home px meds not helping. Pt tearful.

## 2011-12-24 NOTE — ED Notes (Signed)
No answer x3

## 2011-12-25 ENCOUNTER — Encounter (HOSPITAL_COMMUNITY): Payer: Self-pay

## 2011-12-25 ENCOUNTER — Emergency Department (HOSPITAL_COMMUNITY)
Admission: EM | Admit: 2011-12-25 | Discharge: 2011-12-25 | Disposition: A | Discharge status: Home / Self Care | Payer: 59 | Attending: Emergency Medicine | Admitting: Emergency Medicine

## 2011-12-25 DIAGNOSIS — M069 Rheumatoid arthritis, unspecified: Secondary | ICD-10-CM | POA: Insufficient documentation

## 2011-12-25 DIAGNOSIS — Z981 Arthrodesis status: Secondary | ICD-10-CM | POA: Insufficient documentation

## 2011-12-25 DIAGNOSIS — IMO0001 Reserved for inherently not codable concepts without codable children: Secondary | ICD-10-CM | POA: Insufficient documentation

## 2011-12-25 DIAGNOSIS — G8929 Other chronic pain: Secondary | ICD-10-CM

## 2011-12-25 DIAGNOSIS — N189 Chronic kidney disease, unspecified: Secondary | ICD-10-CM | POA: Insufficient documentation

## 2011-12-25 LAB — URINALYSIS, ROUTINE W REFLEX MICROSCOPIC
Bilirubin Urine: NEGATIVE
Glucose, UA: NEGATIVE mg/dL
Ketones, ur: NEGATIVE mg/dL
Nitrite: NEGATIVE
Protein, ur: NEGATIVE mg/dL

## 2011-12-25 LAB — CK: Total CK: 58 U/L (ref 7–177)

## 2011-12-25 MED ORDER — HYDROMORPHONE HCL PF 2 MG/ML IJ SOLN
2.0000 mg | Freq: Once | INTRAMUSCULAR | Status: AC
  Administered 2011-12-25: 2 mg via INTRAMUSCULAR
  Filled 2011-12-25: qty 1

## 2011-12-25 MED ORDER — MORPHINE SULFATE 2 MG/ML IJ SOLN
2.0000 mg | Freq: Once | INTRAMUSCULAR | Status: AC
  Administered 2011-12-25: 2 mg via INTRAMUSCULAR
  Filled 2011-12-25: qty 1

## 2011-12-25 MED ORDER — HYDROMORPHONE HCL 4 MG PO TABS: 4.0000 mg | ORAL_TABLET | ORAL | Status: DC | PRN

## 2011-12-25 NOTE — ED Notes (Signed)
Percocet she was prescribed last night, does not work for her pain.

## 2011-12-25 NOTE — ED Notes (Signed)
sts also needs mri per md it is scheduled

## 2011-12-25 NOTE — ED Notes (Addendum)
Pt is unable to urinate at this time, understands to notify the nurse before she is gets up.

## 2011-12-25 NOTE — ED Provider Notes (Signed)
History   This chart was scribed for Cheri Guppy, MD by Gerlean Ren. This patient was seen in room TR08C/TR08C and the patient's care was started at 2:35PM.   CSN: [id number]  Arrival date & time 12/25/11  1233   First MD Initiated Contact with Patient 12/25/11 1421      Chief Complaint  Patient presents with  . Muscle Pain    (Consider location/radiation/quality/duration/timing/severity/associated sxs/prior treatment) Patient is a 56 y.o. female presenting with musculoskeletal pain. The history is provided by the patient. No language interpreter was used.  Muscle Pain   Kara [name] is a 56 y.o. female who presents to the Emergency Department complaining of 4 days of myalgias consistent with pt h/o fibromyalgia.  Pt reports diarrhea, but normal for her.  Pt denies nausea, emesis, and rash.  Pt denies fall as cause of current pain.  Past Medical History  Diagnosis Date  . Fibromyalgia   . 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 Arthritis   . Rheumatoid arthritis     autoimmune arthritis; methotrexate once/week  . Depression     takes meds daily  . Post-nasal drip     hx of  . Other specified rheumatoid arthritis, right shoulder 08/01/2011  . Asthma   . Allergy   . Chronic kidney disease   . Urinary incontinence     Past Surgical History  Procedure Date  . Shoulder surgery 2012    right  . Back surgery 2011    lower back fusion l4-5, s1  . Bladder surgery 2009  . Tubal ligation 1988  . Breast enhancement surgery 2003  . Liposuction 2003    abdominal  . Tonsillectomy 1987  . Total shoulder arthroplasty 08/01/2011    Procedure: TOTAL SHOULDER ARTHROPLASTY;  Surgeon: Eulas Post, MD;  Location: [facility] OR;  Service: Orthopedics;  Laterality: Right;  Right total shoulder arthroplasty  . Mass excision 11/03/2011    Procedure: MINOR EXCISION OF MASS;  Surgeon: Wyn Forster., MD;  Location: [facility];  Service:  Orthopedics;  Laterality: Left;  debride IP joint, cyst excision left index    Family History  Problem Relation Age of Onset  . Anesthesia problems Neg Hx   . Hypotension Neg Hx   . Malignant hyperthermia Neg Hx   . Pseudochol deficiency Neg Hx   . Arthritis Mother   . Cancer Mother     breast  . Heart disease Mother   . COPD Father   . Hypertension Father   . Cancer Maternal Aunt 27    breast     History  Substance Use Topics  . Smoking status: Never Smoker   . Smokeless tobacco: Not on file  . Alcohol Use: No    No OB history provided.  Review of Systems  Constitutional: Negative for fever and chills.  Respiratory: Negative for cough.   Gastrointestinal: Positive for diarrhea. Negative for nausea and vomiting.  Musculoskeletal: Positive for myalgias. Negative for back pain.    Allergies  Aspirin; Ibuprofen; Ativan; and Tramadol hcl  Home Medications   Current Outpatient Rx  Name Route Sig Dispense Refill  . ARIPIPRAZOLE 5 MG PO TABS Oral Take 5 mg by mouth daily.    [name] CLONAZEPAM 1 MG PO TABS Oral Take 1 mg by mouth 2 (two) times daily as needed. For shoulder pain    . DULOXETINE HCL 60 MG PO CPEP Oral Take 60 mg by  mouth daily.    [name] LEFLUNOMIDE 10 MG PO TABS Oral Take 10 mg by mouth at bedtime.    . MELOXICAM 15 MG PO TABS Oral Take 15 mg by mouth daily.    . OXYCODONE-ACETAMINOPHEN 5-325 MG PO TABS Oral Take 1-2 tablets by mouth every 6 (six) hours as needed for pain. 15 tablet 0  . VENLAFAXINE HCL ER 150 MG PO CP24 Oral Take 150 mg by mouth daily.    [name] VYVANSE 70 MG PO CAPS Oral Take 70 mg by mouth daily.     [name] ZOLPIDEM TARTRATE 10 MG PO TABS Oral Take 10 mg by mouth at bedtime as needed. For sleep      BP 119/95  Pulse 73  Temp 98.9 F (37.2 C) (Oral)  Resp 18  SpO2 99%  Physical Exam  Nursing note and vitals reviewed. Constitutional: She is oriented to person, place, and time. She appears well-developed.  HENT:  Head: Normocephalic and atraumatic.   Cardiovascular: Normal rate, regular rhythm and normal heart sounds.   No murmur heard. Pulmonary/Chest: Effort normal and breath sounds normal. She has no wheezes.  Neurological: She is alert and oriented to person, place, and time.  Psychiatric: She has a normal mood and affect.    ED Course  Procedures (including critical care time) DIAGNOSTIC STUDIES: Oxygen Saturation is 99% on room air, normal by my interpretation.    COORDINATION OF CARE: 2:41PM- Ordered dilaudid, urinalysis, and CK.   Labs Reviewed - No data to display     No diagnosis found.    MDM  Chronic pain  I personally performed the services described in this documentation, which was scribed in my presence. The recorded information has been reviewed and considered.        Cheri Guppy, MD 12/25/11 [phone]

## 2011-12-25 NOTE — ED Notes (Signed)
Pt walked to door and stated she is having a panic attack

## 2011-12-25 NOTE — ED Notes (Signed)
Recent change in medications for RA and Fibromyalgia

## 2011-12-25 NOTE — ED Notes (Signed)
Pt here for pain all over body for three to four days

## 2012-03-21 ENCOUNTER — Other Ambulatory Visit: Payer: 59

## 2012-03-26 ENCOUNTER — Encounter (INDEPENDENT_AMBULATORY_CARE_PROVIDER_SITE_OTHER): Payer: 59 | Admitting: Family Medicine

## 2012-03-26 ENCOUNTER — Other Ambulatory Visit: Payer: Self-pay | Admitting: Family Medicine

## 2012-03-26 ENCOUNTER — Other Ambulatory Visit: Payer: 59

## 2012-03-26 DIAGNOSIS — Z0000 Encounter for general adult medical examination without abnormal findings: Secondary | ICD-10-CM

## 2012-03-26 LAB — BASIC METABOLIC PANEL
BUN: 21 mg/dL (ref 6–23)
CO2: 26 mEq/L (ref 19–32)
GFR: 82.56 mL/min (ref >60.00)
Glucose, Bld: 100 mg/dL — ABNORMAL HIGH (ref 70–99)
Potassium: 4.7 mEq/L (ref 3.5–5.1)
Sodium: 141 mEq/L (ref 135–145)

## 2012-03-26 LAB — HEPATIC FUNCTION PANEL
AST: 18 U/L (ref 0–37)
Albumin: 3.9 g/dL (ref 3.5–5.2)
Total Bilirubin: 0.5 mg/dL (ref 0.3–1.2)

## 2012-03-26 LAB — LIPID PANEL
Cholesterol: 236 mg/dL — ABNORMAL HIGH (ref 0–200)
HDL: 53.6 mg/dL (ref >39.00)
Triglycerides: 182 mg/dL — ABNORMAL HIGH (ref 0.0–149.0)
VLDL: 36.4 mg/dL (ref 0.0–40.0)

## 2012-03-26 LAB — CBC WITH DIFFERENTIAL/PLATELET
Basophils Absolute: 0 10*3/uL (ref 0.0–0.1)
Eosinophils Absolute: 0.1 10*3/uL (ref 0.0–0.7)
HCT: 41.2 % (ref 36.0–46.0)
Hemoglobin: 13.6 g/dL (ref 12.0–15.0)
Lymphs Abs: 1.7 10*3/uL (ref 0.7–4.0)
MCHC: 33 g/dL (ref 30.0–36.0)
Monocytes Absolute: 0.6 10*3/uL (ref 0.1–1.0)
Monocytes Relative: 12.1 % — ABNORMAL HIGH (ref 3.0–12.0)
Neutro Abs: 2.7 10*3/uL (ref 1.4–7.7)
Platelets: 339 10*3/uL (ref 150.0–400.0)
RDW: 13.4 % (ref 11.5–14.6)

## 2012-03-26 LAB — POCT URINALYSIS DIPSTICK
Ketones, UA: NEGATIVE
Spec Grav, UA: 1.025
Urobilinogen, UA: 0.2

## 2012-03-26 LAB — LDL CHOLESTEROL, DIRECT: Direct LDL: 169.6 mg/dL

## 2012-03-26 LAB — TSH: TSH: 1.36 u[IU]/mL (ref 0.35–5.50)

## 2012-03-27 NOTE — Progress Notes (Signed)
This encounter was created in error - please disregard.

## 2012-05-08 ENCOUNTER — Ambulatory Visit (INDEPENDENT_AMBULATORY_CARE_PROVIDER_SITE_OTHER): Payer: 59 | Admitting: Family Medicine

## 2012-05-08 ENCOUNTER — Encounter: Payer: Self-pay | Admitting: Family Medicine

## 2012-05-08 ENCOUNTER — Encounter: Payer: 59 | Admitting: Family Medicine

## 2012-05-08 VITALS — BP 120/90 | HR 72 | Temp 99.2°F | Resp 12 | Ht 64.0 in | Wt 172.0 lb

## 2012-05-08 DIAGNOSIS — Z0000 Encounter for general adult medical examination without abnormal findings: Secondary | ICD-10-CM

## 2012-05-08 MED ORDER — TRETINOIN 0.1 % EX CREA: TOPICAL_CREAM | Freq: Every day | CUTANEOUS | Status: DC

## 2012-05-08 NOTE — Progress Notes (Signed)
Subjective:    Patient ID: Kara [name], female    DOB: [birth year], 56 y.o.   MRN: [id number]  HPI  Patient here for complete physical. Past history significant for seasonal allergies, attention deficit disorder, depression, fibromyalgia, rheumatoid arthritis. She is followed closely by psychiatrist and rheumatology. Needs followup mammogram which she will schedule. She plans to establish with new gynecologist soon. Immunizations are up-to-date. Colonoscopy up to date.  Recent mild dyspnea at rest. This sounds more like anxiety symptoms of air hunger. She has no dyspnea or chest pain whatsoever with exercise or activity.  Past Medical History  Diagnosis Date  . Fibromyalgia   . 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 Arthritis   . Rheumatoid arthritis     autoimmune arthritis; methotrexate once/week  . Depression     takes meds daily  . Post-nasal drip     hx of  . Other specified rheumatoid arthritis, right shoulder 08/01/2011  . Asthma   . Allergy   . Chronic kidney disease   . Urinary incontinence    Past Surgical History  Procedure Laterality Date  . Shoulder surgery  2012    right  . Back surgery  2011    lower back fusion l4-5, s1  . Bladder surgery  2009  . Tubal ligation  1988  . Breast enhancement surgery  2003  . Liposuction  2003    abdominal  . Tonsillectomy  1987  . Total shoulder arthroplasty  08/01/2011    Procedure: TOTAL SHOULDER ARTHROPLASTY;  Surgeon: Eulas Post, MD;  Location: [facility] OR;  Service: Orthopedics;  Laterality: Right;  Right total shoulder arthroplasty  . Mass excision  11/03/2011    Procedure: MINOR EXCISION OF MASS;  Surgeon: Wyn Forster., MD;  Location: [facility];  Service: Orthopedics;  Laterality: Left;  debride IP joint, cyst excision left index    reports that she has never smoked. She does not have any smokeless tobacco history on file. She reports that she uses illicit drugs  (Marijuana). She reports that she does not drink alcohol. family history includes Arthritis in her mother; COPD in her father; Cancer in her mother; Cancer (age of onset: 26) in her maternal aunt; Heart disease in her mother; and Hypertension in her father.  There is no history of Anesthesia problems, and Hypotension, and Malignant hyperthermia, and Pseudochol deficiency, . Allergies  Allergen Reactions  . Aspirin Anaphylaxis  . Ibuprofen Anaphylaxis  . Ativan (Lorazepam) Other (See Comments)    Makes agitated, combative   . Tramadol Hcl Itching and Rash      Review of Systems  Constitutional: Negative for fever, activity change, appetite change, fatigue and unexpected weight change.  HENT: Negative for hearing loss, ear pain, sore throat and trouble swallowing.   Eyes: Negative for visual disturbance.  Respiratory: Negative for cough.   Cardiovascular: Negative for chest pain and palpitations.  Gastrointestinal: Negative for abdominal pain, diarrhea, constipation and blood in stool.  Genitourinary: Negative for dysuria and hematuria.  Musculoskeletal: Negative for myalgias, back pain and arthralgias.  Skin: Negative for rash.  Neurological: Negative for dizziness, syncope and headaches.  Hematological: Negative for adenopathy.  Psychiatric/Behavioral: Negative for confusion and dysphoric mood.       Objective:   Physical Exam  Constitutional: She is oriented to person, place, and time. She appears well-developed and well-nourished.  HENT:  Head: Normocephalic and atraumatic.  Eyes: EOM are normal. Pupils  are equal, round, and reactive to light.  Neck: Normal range of motion. Neck supple. No thyromegaly present.  Cardiovascular: Normal rate, regular rhythm and normal heart sounds.   No murmur heard. Pulmonary/Chest: Breath sounds normal. No respiratory distress. She has no wheezes. She has no rales.  Abdominal: Soft. Bowel sounds are normal. She exhibits no distension and no  mass. There is no tenderness. There is no rebound and no guarding.  Genitourinary:  Per gyn  Musculoskeletal: Normal range of motion. She exhibits no edema.  Lymphadenopathy:    She has no cervical adenopathy.  Neurological: She is alert and oriented to person, place, and time. She displays normal reflexes. No cranial nerve deficit.  Skin: No rash noted.  Psychiatric: She has a normal mood and affect. Her behavior is normal. Judgment and thought content normal.          Assessment & Plan:  Complete physical. Patient will schedule with gynecologist and schedule her mammogram. We discussed cholesterol control diet. She has occasional mild dyspnea at rest only and never with activity. Suspect this is anxiety related. She is describing difficulty getting a deep breath and this occurs only at rest.

## 2012-05-08 NOTE — Patient Instructions (Addendum)
Fat and Cholesterol Control Diet Cholesterol levels in your body are determined significantly by your diet. Cholesterol levels may also be related to heart disease. The following material helps to explain this relationship and discusses what you can do to help keep your heart healthy. Not all cholesterol is bad. Low-density lipoprotein (LDL) cholesterol is the "bad" cholesterol. It may cause fatty deposits to build up inside your arteries. High-density lipoprotein (HDL) cholesterol is "good." It helps to remove the "bad" LDL cholesterol from your blood. Cholesterol is a very important risk factor for heart disease. Other risk factors are high blood pressure, smoking, stress, heredity, and weight. The heart muscle gets its supply of blood through the coronary arteries. If your LDL cholesterol is high and your HDL cholesterol is low, you are at risk for having fatty deposits build up in your coronary arteries. This leaves less room through which blood can flow. Without sufficient blood and oxygen, the heart muscle cannot function properly and you may feel chest pains (angina pectoris). When a coronary artery closes up entirely, a part of the heart muscle may die causing a heart attack (myocardial infarction). CHECKING CHOLESTEROL When your caregiver sends your blood to a lab to be examined for cholesterol, a complete lipid (fat) profile may be done. With this test, the total amount of cholesterol and levels of LDL and HDL are determined. Triglycerides are a type of fat that circulates in the blood. They can also be used to determine heart disease risk. The list below describes what the numbers should be: Test: Total Cholesterol.  Less than 200 mg/dl. Test: LDL "bad cholesterol."  Less than 100 mg/dl.  Less than 70 mg/dl if you are at very high risk of a heart attack or sudden cardiac death. Test: HDL "good cholesterol."  Greater than 50 mg/dl for women.  Greater than 40 mg/dl for men. Test:  Triglycerides.  Less than 150 mg/dl. CONTROLLING CHOLESTEROL WITH DIET Although exercise and lifestyle factors are important, your diet is key. That is because certain foods are known to raise cholesterol and others to lower it. The goal is to balance foods for their effect on cholesterol and more importantly, to replace saturated and trans fat with other types of fat, such as monounsaturated fat, polyunsaturated fat, and omega-3 fatty acids. On average, a person should consume no more than 15 to 17 g of saturated fat daily. Saturated and trans fats are considered "bad" fats, and they will raise LDL cholesterol. Saturated fats are primarily found in animal products such as meats, butter, and cream. However, that does not mean you need to give up all your favorite foods. Today, there are good tasting, low-fat, low-cholesterol substitutes for most of the things you like to eat. Choose low-fat or nonfat alternatives. Choose round or loin cuts of red meat. These types of cuts are lowest in fat and cholesterol. Chicken (without the skin), fish, veal, and ground turkey breast are great choices. Eliminate fatty meats, such as hot dogs and salami. Even shellfish have little or no saturated fat. Have a 3 oz (85 g) portion when you eat lean meat, poultry, or fish. Trans fats are also called "partially hydrogenated oils." They are oils that have been scientifically manipulated so that they are solid at room temperature resulting in a longer shelf life and improved taste and texture of foods in which they are added. Trans fats are found in stick margarine, some tub margarines, cookies, crackers, and baked goods.  When baking and cooking, oils   are a great substitute for butter. The monounsaturated oils are especially beneficial since it is believed they lower LDL and raise HDL. The oils you should avoid entirely are saturated tropical oils, such as coconut and palm.  Remember to eat a lot from food groups that are  naturally free of saturated and trans fat, including fish, fruit, vegetables, beans, grains (barley, rice, couscous, bulgur wheat), and pasta (without cream sauces).  IDENTIFYING FOODS THAT LOWER CHOLESTEROL  Soluble fiber may lower your cholesterol. This type of fiber is found in fruits such as apples, vegetables such as broccoli, potatoes, and carrots, legumes such as beans, peas, and lentils, and grains such as barley. Foods fortified with plant sterols (phytosterol) may also lower cholesterol. You should eat at least 2 g per day of these foods for a cholesterol lowering effect.  Read package labels to identify low-saturated fats, trans fat free, and low-fat foods at the supermarket. Select cheeses that have only 2 to 3 g saturated fat per ounce. Use a heart-healthy tub margarine that is free of trans fats or partially hydrogenated oil. When buying baked goods (cookies, crackers), avoid partially hydrogenated oils. Breads and muffins should be made from whole grains (whole-wheat or whole oat flour, instead of "flour" or "enriched flour"). Buy non-creamy canned soups with reduced salt and no added fats.  FOOD PREPARATION TECHNIQUES  Never deep-fry. If you must fry, either stir-fry, which uses very little fat, or use non-stick cooking sprays. When possible, broil, bake, or roast meats, and steam vegetables. Instead of putting butter or margarine on vegetables, use lemon and herbs, applesauce, and cinnamon (for squash and sweet potatoes), nonfat yogurt, salsa, and low-fat dressings for salads.  LOW-SATURATED FAT / LOW-FAT FOOD SUBSTITUTES Meats / Saturated Fat (g)  Avoid: Steak, marbled (3 oz/85 g) / 11 g  Choose: Steak, lean (3 oz/85 g) / 4 g  Avoid: Hamburger (3 oz/85 g) / 7 g  Choose: Hamburger, lean (3 oz/85 g) / 5 g  Avoid: Ham (3 oz/85 g) / 6 g  Choose: Ham, lean cut (3 oz/85 g) / 2.4 g  Avoid: Chicken, with skin, dark meat (3 oz/85 g) / 4 g  Choose: Chicken, skin removed, dark meat (3  oz/85 g) / 2 g  Avoid: Chicken, with skin, light meat (3 oz/85 g) / 2.5 g  Choose: Chicken, skin removed, light meat (3 oz/85 g) / 1 g Dairy / Saturated Fat (g)  Avoid: Whole milk (1 cup) / 5 g  Choose: Low-fat milk, 2% (1 cup) / 3 g  Choose: Low-fat milk, 1% (1 cup) / 1.5 g  Choose: Skim milk (1 cup) / 0.3 g  Avoid: Hard cheese (1 oz/28 g) / 6 g  Choose: Skim milk cheese (1 oz/28 g) / 2 to 3 g  Avoid: Cottage cheese, 4% fat (1 cup) / 6.5 g  Choose: Low-fat cottage cheese, 1% fat (1 cup) / 1.5 g  Avoid: Ice cream (1 cup) / 9 g  Choose: Sherbet (1 cup) / 2.5 g  Choose: Nonfat frozen yogurt (1 cup) / 0.3 g  Choose: Frozen fruit bar / trace  Avoid: Whipped cream (1 tbs) / 3.5 g  Choose: Nondairy whipped topping (1 tbs) / 1 g Condiments / Saturated Fat (g)  Avoid: Mayonnaise (1 tbs) / 2 g  Choose: Low-fat mayonnaise (1 tbs) / 1 g  Avoid: Butter (1 tbs) / 7 g  Choose: Extra light margarine (1 tbs) / 1 g  Avoid: Coconut oil (1   tbs) / 11.8 g  Choose: Olive oil (1 tbs) / 1.8 g  Choose: Corn oil (1 tbs) / 1.7 g  Choose: Safflower oil (1 tbs) / 1.2 g  Choose: Sunflower oil (1 tbs) / 1.4 g  Choose: Soybean oil (1 tbs) / 2.4 g  Choose: Canola oil (1 tbs) / 1 g Document Released: 03/13/2005 Document Revised: 06/05/2011 Document Reviewed: 09/01/2010 [facility] Patient Information 2013 [place], [place].  Remember to schedule repeat mammogram Gynecologists  Marcelle Overlie [phone]

## 2012-05-27 ENCOUNTER — Ambulatory Visit (INDEPENDENT_AMBULATORY_CARE_PROVIDER_SITE_OTHER): Payer: 59 | Admitting: Family Medicine

## 2012-05-27 ENCOUNTER — Encounter: Payer: Self-pay | Admitting: Family Medicine

## 2012-05-27 VITALS — BP 130/80 | Temp 98.7°F | Wt 175.0 lb

## 2012-05-27 DIAGNOSIS — M542 Cervicalgia: Secondary | ICD-10-CM

## 2012-05-27 DIAGNOSIS — M069 Rheumatoid arthritis, unspecified: Secondary | ICD-10-CM

## 2012-05-27 NOTE — Progress Notes (Signed)
  Subjective:    Patient ID: Kara [name], female    DOB: 1955-04-24, 57 y.o.   MRN: [id number]  HPI Acute visit  Somewhat poorly localized right facial/right neck pain. Onset last week. Patient has history of rheumatoid arthritis which is treated with low-dose prednisone and Arava. She denies any right upper extremity radiculopathy symptoms. Pain radiates from her cervical neck around toward the anterior lower neck region No rash. No upper extremity weakness. No upper extremity numbness  She denies pain with chewing. She does relate occasional bruxism No dysphagia. Occasional right-sided neck pain with swallowing. No recent appetite or weight changes.   Review of Systems  Constitutional: Negative for appetite change and unexpected weight change.  HENT: Positive for neck pain. Negative for hearing loss, ear pain, sore throat, neck stiffness, voice change and tinnitus.   Cardiovascular: Negative for chest pain.  Neurological: Negative for weakness and numbness.       Objective:   Physical Exam  Constitutional: She appears well-developed and well-nourished. No distress.  HENT:  Right Ear: External ear normal.  Left Ear: External ear normal.  Left lower posterior molar decay otherwise normal exam. No erythema or exudate She does not have any tenderness over TMJ joint  Neck: Neck supple. No thyromegaly present.  Cardiovascular: Normal rate and regular rhythm.   Pulmonary/Chest: Effort normal and breath sounds normal. No respiratory distress. She has no wheezes. She has no rales.  Lymphadenopathy:    She has no cervical adenopathy.  Neurological:  Full-strength upper extremities. Symmetric reflexes. Normal sensory function          Assessment & Plan:  Right neck pain in a patient with rheumatoid arthritis history. She has radiation toward right neck and this could represent cervical nerve impingement. Patient requesting x-rays. Start with plain films. Doubt TMJ syndrome

## 2012-06-20 ENCOUNTER — Telehealth: Payer: Self-pay | Admitting: Family Medicine

## 2012-06-20 ENCOUNTER — Ambulatory Visit: Payer: 59 | Admitting: Family Medicine

## 2012-06-20 NOTE — Telephone Encounter (Signed)
We received notification that the pt did not complete the c-spine films ordered by Dr. Caryl Never on 05/27/12. Please call pt to complete or cancel order if not needed. Thank you.

## 2012-06-20 NOTE — Telephone Encounter (Signed)
Pt had cervical spine x-rays done at Dr Tawana Scale 'office, Rheumatology.  I cancelled our x-ay order

## 2012-07-17 ENCOUNTER — Ambulatory Visit: Payer: 59 | Admitting: Family Medicine

## 2012-08-29 ENCOUNTER — Encounter: Payer: Self-pay | Admitting: Family Medicine

## 2012-08-29 ENCOUNTER — Ambulatory Visit: Payer: 59 | Admitting: Family Medicine

## 2012-08-30 ENCOUNTER — Encounter (HOSPITAL_COMMUNITY): Payer: Self-pay | Admitting: Family Medicine

## 2012-08-30 ENCOUNTER — Emergency Department (HOSPITAL_COMMUNITY)
Admission: EM | Admit: 2012-08-30 | Discharge: 2012-08-30 | Disposition: A | Discharge status: Home / Self Care | Payer: 59 | Attending: Emergency Medicine | Admitting: Emergency Medicine

## 2012-08-30 DIAGNOSIS — Z79899 Other long term (current) drug therapy: Secondary | ICD-10-CM | POA: Insufficient documentation

## 2012-08-30 DIAGNOSIS — J45909 Unspecified asthma, uncomplicated: Secondary | ICD-10-CM | POA: Insufficient documentation

## 2012-08-30 DIAGNOSIS — F3289 Other specified depressive episodes: Secondary | ICD-10-CM | POA: Insufficient documentation

## 2012-08-30 DIAGNOSIS — M25519 Pain in unspecified shoulder: Secondary | ICD-10-CM | POA: Insufficient documentation

## 2012-08-30 DIAGNOSIS — Z96619 Presence of unspecified artificial shoulder joint: Secondary | ICD-10-CM | POA: Insufficient documentation

## 2012-08-30 DIAGNOSIS — Z791 Long term (current) use of non-steroidal anti-inflammatories (NSAID): Secondary | ICD-10-CM | POA: Insufficient documentation

## 2012-08-30 DIAGNOSIS — F29 Unspecified psychosis not due to a substance or known physiological condition: Secondary | ICD-10-CM | POA: Insufficient documentation

## 2012-08-30 DIAGNOSIS — F419 Anxiety disorder, unspecified: Secondary | ICD-10-CM

## 2012-08-30 DIAGNOSIS — M069 Rheumatoid arthritis, unspecified: Secondary | ICD-10-CM | POA: Insufficient documentation

## 2012-08-30 DIAGNOSIS — IMO0001 Reserved for inherently not codable concepts without codable children: Secondary | ICD-10-CM | POA: Insufficient documentation

## 2012-08-30 DIAGNOSIS — N189 Chronic kidney disease, unspecified: Secondary | ICD-10-CM | POA: Insufficient documentation

## 2012-08-30 DIAGNOSIS — F329 Major depressive disorder, single episode, unspecified: Secondary | ICD-10-CM | POA: Insufficient documentation

## 2012-08-30 DIAGNOSIS — F411 Generalized anxiety disorder: Secondary | ICD-10-CM | POA: Insufficient documentation

## 2012-08-30 DIAGNOSIS — Z9889 Other specified postprocedural states: Secondary | ICD-10-CM | POA: Insufficient documentation

## 2012-08-30 DIAGNOSIS — Z87448 Personal history of other diseases of urinary system: Secondary | ICD-10-CM | POA: Insufficient documentation

## 2012-08-30 MED ORDER — OXYCODONE-ACETAMINOPHEN 5-325 MG PO TABS
2.0000 | ORAL_TABLET | Freq: Once | ORAL | Status: AC
  Administered 2012-08-30: 2 via ORAL
  Filled 2012-08-30: qty 2

## 2012-08-30 MED ORDER — VENLAFAXINE HCL ER 75 MG PO CP24: 75.0000 mg | ORAL_CAPSULE | Freq: Every day | ORAL | Status: DC

## 2012-08-30 NOTE — ED Notes (Signed)
Per pt she has RA and her doctor took her off Effexor. sts last dose on 24th of May.

## 2012-08-30 NOTE — ED Provider Notes (Signed)
History     CSN: [id number]  Arrival date & time 08/30/12  0750   First MD Initiated Contact with Patient 08/30/12 [phone]      Chief Complaint  Patient presents with  . Drug Problem    (Consider location/radiation/quality/duration/timing/severity/associated sxs/prior treatment) HPI Comments: 57 year old female with a history of anxiety, depression and fibromyalgia with rheumatoid arthritis. She presents after being taken off her antidepressant medications. She has been taking multiple different medications including Abilify, Pristiq , Cymbalta, Effexor and was recently tapered off of these medications so that at this time she only takes Abilify. She feels like her anxiety has risen to a high level and feels like she is going to have a panic attack. She denies that she has any significant depression or suicidal thoughts, has no hallucinations and has a good support system with her husband at home. The symptoms are persistent, gradually worsening, not associated with physical complaints other than bilateral shoulder blade which is chronic and related to her rheumatoid arthritis  Patient is a 57 y.o. female presenting with drug problem. The history is provided by the patient.  Drug Problem    Past Medical History  Diagnosis Date  . Fibromyalgia   . 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 Arthritis   . Rheumatoid arthritis(714.0)     autoimmune arthritis; methotrexate once/week  . Depression     takes meds daily  . Post-nasal drip     hx of  . Other specified rheumatoid arthritis, right shoulder 08/01/2011  . Asthma   . Allergy   . Chronic kidney disease   . Urinary incontinence     Past Surgical History  Procedure Laterality Date  . Shoulder surgery  2012    right  . Back surgery  2011    lower back fusion l4-5, s1  . Bladder surgery  2009  . Tubal ligation  1988  . Breast enhancement surgery  2003  . Liposuction  2003    abdominal  .  Tonsillectomy  1987  . Total shoulder arthroplasty  08/01/2011    Procedure: TOTAL SHOULDER ARTHROPLASTY;  Surgeon: Eulas Post, MD;  Location: [facility] OR;  Service: Orthopedics;  Laterality: Right;  Right total shoulder arthroplasty  . Mass excision  11/03/2011    Procedure: MINOR EXCISION OF MASS;  Surgeon: Wyn Forster., MD;  Location: [facility];  Service: Orthopedics;  Laterality: Left;  debride IP joint, cyst excision left index    Family History  Problem Relation Age of Onset  . Anesthesia problems Neg Hx   . Hypotension Neg Hx   . Malignant hyperthermia Neg Hx   . Pseudochol deficiency Neg Hx   . Arthritis Mother   . Cancer Mother     breast  . Heart disease Mother   . COPD Father   . Hypertension Father   . Cancer Maternal Aunt 27    breast     History  Substance Use Topics  . Smoking status: Never Smoker   . Smokeless tobacco: Not on file  . Alcohol Use: No    OB History   Grav Para Term Preterm Abortions TAB SAB Ect Mult Living                  Review of Systems  All other systems reviewed and are negative.    Allergies  Aspirin; Ibuprofen; Ativan; and Tramadol hcl  Home Medications   Current Outpatient  Rx  Name  Route  Sig  Dispense  Refill  . ARIPiprazole (ABILIFY) 15 MG tablet   Oral   Take 7.5 mg by mouth daily.         [name] HYDROcodone-acetaminophen (NORCO) 10-325 MG per tablet   Oral   Take 1 tablet by mouth. 1-2 daily as needed for pain per Dr Dierdre Forth         . leflunomide (ARAVA) 20 MG tablet   Oral   Take 20 mg by mouth at bedtime.         . meloxicam (MOBIC) 15 MG tablet   Oral   Take 15 mg by mouth daily.          . predniSONE (DELTASONE) 5 MG tablet   Oral   Take 5 mg by mouth as needed. For inflammation         . tretinoin (RETIN-A) 0.1 % cream   Topical   Apply topically at bedtime.   45 g   1   . VYVANSE 70 MG capsule   Oral   Take 70 mg by mouth daily. Per Joeseph Amor, PA         .  zolpidem (AMBIEN) 10 MG tablet   Oral   Take 10 mg by mouth at bedtime as needed. For sleep per Joeseph Amor, PA         . venlafaxine XR (EFFEXOR-XR) 75 MG 24 hr capsule   Oral   Take 1 capsule (75 mg total) by mouth daily.   30 capsule   1     BP 152/97  Pulse 92  Temp(Src) 98.3 F (36.8 C)  Resp 18  SpO2 98%  Physical Exam  Nursing note and vitals reviewed. Constitutional: She appears well-developed and well-nourished. No distress.  HENT:  Head: Normocephalic and atraumatic.  Mouth/Throat: Oropharynx is clear and moist. No oropharyngeal exudate.  Eyes: Conjunctivae and EOM are normal. Pupils are equal, round, and reactive to light. Right eye exhibits no discharge. Left eye exhibits no discharge. No scleral icterus.  Neck: Normal range of motion. Neck supple. No JVD present. No thyromegaly present.  Cardiovascular: Normal rate, regular rhythm, normal heart sounds and intact distal pulses.  Exam reveals no gallop and no friction rub.   No murmur heard. Pulmonary/Chest: Effort normal and breath sounds normal. No respiratory distress. She has no wheezes. She has no rales.  Abdominal: Soft. Bowel sounds are normal. She exhibits no distension and no mass. There is no tenderness.  Musculoskeletal: Normal range of motion. She exhibits no edema and no tenderness.  Lymphadenopathy:    She has no cervical adenopathy.  Neurological: She is alert. Coordination normal.  Skin: Skin is warm and dry. No rash noted. No erythema.  Psychiatric:  Mildly anxious affect, mildly tearful, denies suicidal thoughts or hallucinations    ED Course  Procedures (including critical care time)  Labs Reviewed - No data to display No results found.   1. Anxiety   2. Shoulder pain, unspecified laterality       MDM  The patient actually has full range of motion of both shoulders despite having ongoing pain which she relates to her rheumatoid arthritis. She has mild depression and anxiety,  likely needs to be back on her Effexor as multiple medications were taken away at the same time. This will be prescribed, 75 mg daily, asked to followup with her psychiatrist and she agrees.   Meds given in ED:  Medications  oxyCODONE-acetaminophen (PERCOCET/ROXICET) 5-325 MG  per tablet 2 tablet (not administered)    New Prescriptions   No medications on file            Vida Roller, MD 08/30/12 [phone]

## 2012-09-05 ENCOUNTER — Telehealth: Payer: Self-pay | Admitting: Family Medicine

## 2012-09-05 NOTE — Telephone Encounter (Signed)
Patient Information:  Caller Name: MYLANI  Phone: [phone]  Patient: Kara [name]  Gender: Female  DOB: 1955/10/20  Age: 57 Years  PCP: Evelena Peat (Family Practice)  Office Follow Up:  Does the office need to follow up with this patient?: Yes  Instructions For The Office: See RN notes  RN Note:  Patient reports she currently takes Abilify 7.5mg  once daily, Ambien 10mg  at HS, Effexor (did not give dosage) and Vyvanse (did not give dosage). She has been having issues with getting these medications refilled. She ended up going to the ED in order to ger her Effexor refilled. Was supposed to have an appointment with someone today regarding her other refills, but the person cancelled/rescheduled the appointment for Monday 09/09/12. Patient needs refills of Abilify and Ambien. CONTACT HER REGARDING SCHEDULING AN APPOINTMENT REGARDING HER ANXIETY/DEPRESSION AND MEDICATION REFILLS.  Symptoms  Reason For Call & Symptoms: Patient requesting appointment to be seen for depression/anxiety and refills of medications for this.  Reviewed Health History In EMR: N/A  Reviewed Medications In EMR: N/A  Reviewed Allergies In EMR: N/A  Reviewed Surgeries / Procedures: N/A  Date of Onset of Symptoms: Unknown  Treatments Tried: Abilify, Ambien, Vyvanse  Treatments Tried Worked: No  Guideline(s) Used:  No Protocol Available - Information Only  Disposition Per Guideline:   Discuss with PCP and Callback by Nurse Today  Reason For Disposition Reached:   Nursing judgment  Advice Given:  N/A  Patient Will Follow Care Advice:  YES

## 2012-09-05 NOTE — Telephone Encounter (Signed)
#  1, I do not see a future appt scheduled, I do not see an appt was cancelled for today? #2, pt was a no show 6/5 (pap) #3. Pt had an ER visit and Effexor 75 mg re-started

## 2012-09-05 NOTE — Telephone Encounter (Signed)
#  4, I do not see that we have ever prescribed the Abilify or Ambien #5, I called and spoke with pt as we have not prescribed these med for her before.  She reports she is out of med, I explained Dr Caryl Never would want to see her.  Pt agreed and is scheduling OV tomorrow now.

## 2012-09-06 ENCOUNTER — Ambulatory Visit: Payer: 59 | Admitting: Family Medicine

## 2012-09-06 ENCOUNTER — Telehealth: Payer: Self-pay | Admitting: Family Medicine

## 2012-09-06 NOTE — Telephone Encounter (Signed)
Pt called and said she was able to get her med rx'd by Dr Secundino Ginger - a psych doctor. So she does not need Dr B to rx it and she cancelled her appt for today. States they rx'd her Abilify, Effexor, Ambien, and Adderall yesterday. FYI - please advise.

## 2012-09-06 NOTE — Telephone Encounter (Signed)
noted 

## 2012-10-19 ENCOUNTER — Emergency Department (HOSPITAL_COMMUNITY)
Admission: EM | Admit: 2012-10-19 | Discharge: 2012-10-19 | Disposition: A | Discharge status: Home / Self Care | Payer: 59 | Attending: Emergency Medicine | Admitting: Emergency Medicine

## 2012-10-19 ENCOUNTER — Encounter (HOSPITAL_COMMUNITY): Payer: Self-pay

## 2012-10-19 DIAGNOSIS — J45909 Unspecified asthma, uncomplicated: Secondary | ICD-10-CM | POA: Insufficient documentation

## 2012-10-19 DIAGNOSIS — M25559 Pain in unspecified hip: Secondary | ICD-10-CM | POA: Insufficient documentation

## 2012-10-19 DIAGNOSIS — M545 Low back pain, unspecified: Secondary | ICD-10-CM | POA: Insufficient documentation

## 2012-10-19 DIAGNOSIS — Z8739 Personal history of other diseases of the musculoskeletal system and connective tissue: Secondary | ICD-10-CM | POA: Insufficient documentation

## 2012-10-19 DIAGNOSIS — M069 Rheumatoid arthritis, unspecified: Secondary | ICD-10-CM | POA: Insufficient documentation

## 2012-10-19 DIAGNOSIS — Z79899 Other long term (current) drug therapy: Secondary | ICD-10-CM | POA: Insufficient documentation

## 2012-10-19 DIAGNOSIS — F329 Major depressive disorder, single episode, unspecified: Secondary | ICD-10-CM | POA: Insufficient documentation

## 2012-10-19 DIAGNOSIS — N189 Chronic kidney disease, unspecified: Secondary | ICD-10-CM | POA: Insufficient documentation

## 2012-10-19 DIAGNOSIS — Z791 Long term (current) use of non-steroidal anti-inflammatories (NSAID): Secondary | ICD-10-CM | POA: Insufficient documentation

## 2012-10-19 DIAGNOSIS — M549 Dorsalgia, unspecified: Secondary | ICD-10-CM

## 2012-10-19 DIAGNOSIS — F3289 Other specified depressive episodes: Secondary | ICD-10-CM | POA: Insufficient documentation

## 2012-10-19 DIAGNOSIS — Z96619 Presence of unspecified artificial shoulder joint: Secondary | ICD-10-CM | POA: Insufficient documentation

## 2012-10-19 MED ORDER — ONDANSETRON 4 MG PO TBDP
4.0000 mg | ORAL_TABLET | Freq: Once | ORAL | Status: AC
  Administered 2012-10-19: 4 mg via ORAL
  Filled 2012-10-19: qty 1

## 2012-10-19 MED ORDER — HYDROMORPHONE HCL PF 2 MG/ML IJ SOLN
2.0000 mg | Freq: Once | INTRAMUSCULAR | Status: AC
  Administered 2012-10-19: 2 mg via INTRAMUSCULAR
  Filled 2012-10-19: qty 1

## 2012-10-19 NOTE — ED Provider Notes (Signed)
CSN: [id number]     Arrival date & time 10/19/12  0530 History     First MD Initiated Contact with Patient 10/19/12 [phone]     Chief Complaint  Patient presents with  . Back Pain   (Consider location/radiation/quality/duration/timing/severity/associated sxs/prior Treatment) HPI  Kara [name] is a 57 y.o.female with a significant PMH of fibromyalgia, arthritis, RA, depression, asthma, allergy, CKD, presents to the ER with complaints of back pain exacerbation. She was seen yesterday for the same and had a facet infection for it but it has not helped. She was prescribed 10 Vicodin and has already taken all but 1. She says that this has not touched her pain. She has been referred to Dr. Ophelia Charter and they are discussing another back surgery. She is not having any changes in bowel control, she is able to walk without difficulty. The pain is in her left hip and stops right above the knee. No fevers, nausea, vomiting, diarrhea, weakness.     Past Medical History  Diagnosis Date  . Fibromyalgia   . 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 Arthritis   . Rheumatoid arthritis(714.0)     autoimmune arthritis; methotrexate once/week  . Depression     takes meds daily  . Post-nasal drip     hx of  . Other specified rheumatoid arthritis, right shoulder 08/01/2011  . Asthma   . Allergy   . Chronic kidney disease   . Urinary incontinence    Past Surgical History  Procedure Laterality Date  . Shoulder surgery  2012    right  . Back surgery  2011    lower back fusion l4-5, s1  . Bladder surgery  2009  . Tubal ligation  1988  . Breast enhancement surgery  2003  . Liposuction  2003    abdominal  . Tonsillectomy  1987  . Total shoulder arthroplasty  08/01/2011    Procedure: TOTAL SHOULDER ARTHROPLASTY;  Surgeon: Eulas Post, MD;  Location: [facility] OR;  Service: Orthopedics;  Laterality: Right;  Right total shoulder arthroplasty  . Mass excision  11/03/2011    Procedure:  MINOR EXCISION OF MASS;  Surgeon: Wyn Forster., MD;  Location: [facility];  Service: Orthopedics;  Laterality: Left;  debride IP joint, cyst excision left index   Family History  Problem Relation Age of Onset  . Anesthesia problems Neg Hx   . Hypotension Neg Hx   . Malignant hyperthermia Neg Hx   . Pseudochol deficiency Neg Hx   . Arthritis Mother   . Cancer Mother     breast  . Heart disease Mother   . COPD Father   . Hypertension Father   . Cancer Maternal Aunt 27    breast    History  Substance Use Topics  . Smoking status: Never Smoker   . Smokeless tobacco: Not on file  . Alcohol Use: No   OB History   Grav Para Term Preterm Abortions TAB SAB Ect Mult Living                 Review of Systems  Musculoskeletal: Positive for back pain.  All other systems reviewed and are negative.    Allergies  Aspirin; Ibuprofen; Ativan; and Tramadol hcl  Home Medications   Current Outpatient Rx  Name  Route  Sig  Dispense  Refill  . ARIPiprazole (ABILIFY) 15 MG tablet   Oral   Take 7.5 mg by  mouth daily.         [name] leflunomide (ARAVA) 20 MG tablet   Oral   Take 20 mg by mouth at bedtime.         . meloxicam (MOBIC) 15 MG tablet   Oral   Take 15 mg by mouth daily.          [name] oxyCODONE-acetaminophen (PERCOCET) 10-325 MG per tablet   Oral   Take 1 tablet by mouth every 4 (four) hours as needed for pain.         [name] tretinoin (RETIN-A) 0.1 % cream   Topical   Apply topically at bedtime.   45 g   1   . venlafaxine XR (EFFEXOR-XR) 150 MG 24 hr capsule   Oral   Take 150 mg by mouth daily.         [name] VYVANSE 70 MG capsule   Oral   Take 70 mg by mouth daily. Per Joeseph Amor, PA         . zolpidem (AMBIEN) 10 MG tablet   Oral   Take 10 mg by mouth at bedtime as needed. For sleep per Joeseph Amor, PA          BP 178/95  Temp(Src) 98.2 F (36.8 C) (Oral)  Resp 22  SpO2 95% Physical Exam  Nursing note and vitals  reviewed. Constitutional: She appears well-developed and well-nourished. No distress.  HENT:  Head: Normocephalic and atraumatic.  Eyes: Pupils are equal, round, and reactive to light.  Neck: Normal range of motion. Neck supple.  Cardiovascular: Normal rate and regular rhythm.   Pulmonary/Chest: Effort normal.  Abdominal: Soft.  Musculoskeletal:       Lumbar back: She exhibits tenderness, bony tenderness and pain. She exhibits normal range of motion.       Back:   Equal strength to bilateral lower extremities. Neurosensory function adequate to both legs. Skin color is normal. Skin is warm and moist. I see no step off deformity, no bony tenderness. Pt is able to ambulate without limp. Pain is relieved when sitting in certain positions. ROM is decreased due to pain. No crepitus, laceration, effusion, swelling.  Pulses are normal   Neurological: She is alert.  Skin: Skin is warm and dry.    ED Course   MDM: pt informed that since she is not taking her Dilaudid appropriately that I will not refill her medication here in the ED. 2 mg IM Dilaudid given in ED to control patients pain. Pt education on taking medication as prescribed. Pt will be referred back to Dr. Ophelia Charter, she is to call on Monday. If she needs she can return to the ED as needed.   Procedures (including critical care time)  Labs Reviewed - No data to display No results found. No diagnosis found. Dx:  Hip pain Back pain   MDM  Patient with back pain. No neurological deficits. Patient is ambulatory. No warning symptoms of back pain including: loss of bowel or bladder control, night sweats, waking from sleep with back pain, unexplained fevers or weight loss, h/o cancer, IVDU, recent significant  trauma. No concern for cauda equina, epidural abscess, or other serious cause of back pain. Conservative measures such as rest, ice/heat and pain medicine indicated with PCP follow-up if no improvement with conservative management.     57 y.o.Kara [name] evaluation in the Emergency Department is complete. It has been determined that no acute conditions requiring further emergency intervention are present at this time. The patient/guardian  have been advised of the diagnosis and plan. We have discussed signs and symptoms that warrant return to the ED, such as changes or worsening in symptoms.  Vital signs are stable at discharge. Filed Vitals:   10/19/12 0535  BP: 178/95  Temp: 98.2 F (36.8 C)  Resp: 22    Patient/guardian has voiced understanding and agreed to follow-up with the PCP or specialist.   Dorthula Matas, PA-C 10/19/12 0701

## 2012-10-19 NOTE — ED Notes (Signed)
C/o L hip pain, radiates down L leg, (denies: fever, nvd, loss of control of bowel or bladder, foot drop, weakness, urinary sx, or other sx). Facet injection done at [facility] ortho group yesterday, initial relief d/t local anesthetic, gradually progressively worse, no relief, percocet 10-325 not helping. CMS & ROM intact.

## 2012-10-19 NOTE — ED Provider Notes (Signed)
Medical screening examination/treatment/procedure(s) were performed by non-physician practitioner and as supervising physician I was immediately available for consultation/collaboration.  John-Adam Estelita Iten, M.D.     John-Adam Cieara Stierwalt, MD 10/19/12 0704 

## 2012-10-19 NOTE — ED Notes (Signed)
Pt reports hx of back pain and back surgery, had injection yesterday for back pain and complains now of no changein pain, crying in triage

## 2012-10-20 ENCOUNTER — Encounter (HOSPITAL_COMMUNITY): Payer: Self-pay | Admitting: *Deleted

## 2012-10-20 ENCOUNTER — Emergency Department (HOSPITAL_COMMUNITY)
Admission: EM | Admit: 2012-10-20 | Discharge: 2012-10-20 | Disposition: A | Discharge status: Home / Self Care | Payer: 59 | Attending: Emergency Medicine | Admitting: Emergency Medicine

## 2012-10-20 DIAGNOSIS — M069 Rheumatoid arthritis, unspecified: Secondary | ICD-10-CM | POA: Insufficient documentation

## 2012-10-20 DIAGNOSIS — M25569 Pain in unspecified knee: Secondary | ICD-10-CM | POA: Insufficient documentation

## 2012-10-20 DIAGNOSIS — Z79899 Other long term (current) drug therapy: Secondary | ICD-10-CM | POA: Insufficient documentation

## 2012-10-20 DIAGNOSIS — F329 Major depressive disorder, single episode, unspecified: Secondary | ICD-10-CM | POA: Insufficient documentation

## 2012-10-20 DIAGNOSIS — Z8739 Personal history of other diseases of the musculoskeletal system and connective tissue: Secondary | ICD-10-CM | POA: Insufficient documentation

## 2012-10-20 DIAGNOSIS — M25552 Pain in left hip: Secondary | ICD-10-CM

## 2012-10-20 DIAGNOSIS — J45909 Unspecified asthma, uncomplicated: Secondary | ICD-10-CM | POA: Insufficient documentation

## 2012-10-20 DIAGNOSIS — F3289 Other specified depressive episodes: Secondary | ICD-10-CM | POA: Insufficient documentation

## 2012-10-20 DIAGNOSIS — IMO0001 Reserved for inherently not codable concepts without codable children: Secondary | ICD-10-CM | POA: Insufficient documentation

## 2012-10-20 DIAGNOSIS — N189 Chronic kidney disease, unspecified: Secondary | ICD-10-CM | POA: Insufficient documentation

## 2012-10-20 MED ORDER — OXYCODONE-ACETAMINOPHEN 5-325 MG PO TABS: 1.0000 | ORAL_TABLET | ORAL | Status: DC | PRN

## 2012-10-20 MED ORDER — OXYCODONE-ACETAMINOPHEN 5-325 MG PO TABS: 2.0000 | ORAL_TABLET | Freq: Once | ORAL | Status: DC

## 2012-10-20 MED ORDER — HYDROMORPHONE HCL PF 2 MG/ML IJ SOLN
2.0000 mg | Freq: Once | INTRAMUSCULAR | Status: AC
  Administered 2012-10-20: 2 mg via INTRAMUSCULAR
  Filled 2012-10-20: qty 1

## 2012-10-20 NOTE — ED Provider Notes (Signed)
CSN: [id number]     Arrival date & time 10/20/12  0800 History     First MD Initiated Contact with Patient 10/20/12 [phone]     Chief Complaint  Patient presents with  . Hip Pain    The history is provided by the patient and medical records.   patient reports worsening left hip pain over the past several days.  She has a history of fibromyalgia and arthritis.  She saw her orthopedic surgeon on Friday 2 days ago and received an injection in her lower lumbar spine for possible radicular pain.  She states no improvement in her symptoms.  She was seen in the emergency apartment yesterday and received IM Dilaudid with improvement in her symptoms but states that she returns today with ongoing pain.  She was sent home with any pain medication from the emergency department yesterday because he had been taking her Vicodin excessively at home and there was concerns about her use of narcotics.  Patient reports ongoing moderate to severe pain at this time.  No fevers or chills.  No new symptoms just ongoing pain.  She denies urinary symptoms.  No flank pain.  No abdominal pain.  No back pain.  No numbness or weakness of her lower extremities.  She is still able to ambulate and was able to ambulate into the emergency department today  Past Medical History  Diagnosis Date  . Fibromyalgia   . 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 Arthritis   . Rheumatoid arthritis(714.0)     autoimmune arthritis; methotrexate once/week  . Depression     takes meds daily  . Post-nasal drip     hx of  . Other specified rheumatoid arthritis, right shoulder 08/01/2011  . Asthma   . Allergy   . Chronic kidney disease   . Urinary incontinence    Past Surgical History  Procedure Laterality Date  . Shoulder surgery  2012    right  . Back surgery  2011    lower back fusion l4-5, s1  . Bladder surgery  2009  . Tubal ligation  1988  . Breast enhancement surgery  2003  . Liposuction  2003   abdominal  . Tonsillectomy  1987  . Total shoulder arthroplasty  08/01/2011    Procedure: TOTAL SHOULDER ARTHROPLASTY;  Surgeon: Eulas Post, MD;  Location: [facility] OR;  Service: Orthopedics;  Laterality: Right;  Right total shoulder arthroplasty  . Mass excision  11/03/2011    Procedure: MINOR EXCISION OF MASS;  Surgeon: Wyn Forster., MD;  Location: [facility];  Service: Orthopedics;  Laterality: Left;  debride IP joint, cyst excision left index   Family History  Problem Relation Age of Onset  . Anesthesia problems Neg Hx   . Hypotension Neg Hx   . Malignant hyperthermia Neg Hx   . Pseudochol deficiency Neg Hx   . Arthritis Mother   . Cancer Mother     breast  . Heart disease Mother   . COPD Father   . Hypertension Father   . Cancer Maternal Aunt 27    breast    History  Substance Use Topics  . Smoking status: Never Smoker   . Smokeless tobacco: Not on file  . Alcohol Use: No   OB History   Grav Para Term Preterm Abortions TAB SAB Ect Mult Living  Review of Systems  All other systems reviewed and are negative.    Allergies  Aspirin; Ibuprofen; Ativan; and Tramadol hcl  Home Medications   Current Outpatient Rx  Name  Route  Sig  Dispense  Refill  . ARIPiprazole (ABILIFY) 15 MG tablet   Oral   Take 7.5 mg by mouth daily.         [name] leflunomide (ARAVA) 20 MG tablet   Oral   Take 20 mg by mouth at bedtime.         . meloxicam (MOBIC) 15 MG tablet   Oral   Take 15 mg by mouth daily.          [name] oxyCODONE-acetaminophen (PERCOCET) 10-325 MG per tablet   Oral   Take 1 tablet by mouth every 4 (four) hours as needed for pain.         [name] venlafaxine XR (EFFEXOR-XR) 150 MG 24 hr capsule   Oral   Take 150 mg by mouth daily.         [name] VYVANSE 70 MG capsule   Oral   Take 70 mg by mouth daily. Per Joeseph Amor, PA         . zolpidem (AMBIEN) 10 MG tablet   Oral   Take 10 mg by mouth at bedtime as needed. For sleep per  Joeseph Amor, PA         . oxyCODONE-acetaminophen (PERCOCET/ROXICET) 5-325 MG per tablet   Oral   Take 1 tablet by mouth every 4 (four) hours as needed for pain.   20 tablet   0   . tretinoin (RETIN-A) 0.1 % cream   Topical   Apply topically at bedtime.   45 g   1    BP 147/89  Pulse 70  Temp(Src) 98.6 F (37 C) (Oral)  Resp 18  SpO2 98% Physical Exam  Nursing note and vitals reviewed. Constitutional: She is oriented to person, place, and time. She appears well-developed and well-nourished. No distress.  HENT:  Head: Normocephalic and atraumatic.  Eyes: EOM are normal.  Neck: Normal range of motion.  Cardiovascular: Normal rate, regular rhythm and normal heart sounds.   Pulmonary/Chest: Effort normal and breath sounds normal.  Abdominal: Soft. She exhibits no distension. There is no tenderness.  Musculoskeletal: Normal range of motion.  Full range of motion of left hip.  No significant focal tenderness over the left lateral hip.  Normal left PT and DP pulses.  No swelling of the left lower extremity as compared to the right.  No rash or rash consistent with zoster coming across to her left buttock, left hip or left groin.  Neurological: She is alert and oriented to person, place, and time.  Skin: Skin is warm and dry.  Psychiatric: She has a normal mood and affect. Judgment normal.    ED Course   Procedures (including critical care time)  Labs Reviewed - No data to display No results found. 1. Left hip pain     MDM  Pain treated and improved in the emergency department.  Discharge home with pain medication.  I've requested that she continue to followup with her orthopedic team this week.  Lyanne Co, MD 10/20/12 [phone]

## 2012-10-20 NOTE — ED Notes (Signed)
Patient states she was seen here yesterday for pain in left hip and states she was able to walk when she walk and through the night the pain became worse and patient is unable to walk at this time due to pain in the left hip. Pain started x 4 days ago. Patient denies any injury to cause said pain.

## 2012-10-21 ENCOUNTER — Emergency Department (HOSPITAL_COMMUNITY): Payer: 59

## 2012-10-21 ENCOUNTER — Encounter (HOSPITAL_COMMUNITY): Payer: Self-pay | Admitting: Emergency Medicine

## 2012-10-21 ENCOUNTER — Emergency Department (HOSPITAL_COMMUNITY)
Admission: EM | Admit: 2012-10-21 | Discharge: 2012-10-21 | Disposition: A | Discharge status: Home / Self Care | Payer: 59 | Attending: Emergency Medicine | Admitting: Emergency Medicine

## 2012-10-21 DIAGNOSIS — J45909 Unspecified asthma, uncomplicated: Secondary | ICD-10-CM | POA: Insufficient documentation

## 2012-10-21 DIAGNOSIS — F3289 Other specified depressive episodes: Secondary | ICD-10-CM | POA: Insufficient documentation

## 2012-10-21 DIAGNOSIS — R109 Unspecified abdominal pain: Secondary | ICD-10-CM | POA: Insufficient documentation

## 2012-10-21 DIAGNOSIS — F329 Major depressive disorder, single episode, unspecified: Secondary | ICD-10-CM | POA: Insufficient documentation

## 2012-10-21 DIAGNOSIS — M25559 Pain in unspecified hip: Secondary | ICD-10-CM | POA: Insufficient documentation

## 2012-10-21 DIAGNOSIS — M545 Low back pain, unspecified: Secondary | ICD-10-CM | POA: Insufficient documentation

## 2012-10-21 DIAGNOSIS — Z8659 Personal history of other mental and behavioral disorders: Secondary | ICD-10-CM | POA: Insufficient documentation

## 2012-10-21 DIAGNOSIS — M069 Rheumatoid arthritis, unspecified: Secondary | ICD-10-CM | POA: Insufficient documentation

## 2012-10-21 DIAGNOSIS — N189 Chronic kidney disease, unspecified: Secondary | ICD-10-CM | POA: Insufficient documentation

## 2012-10-21 DIAGNOSIS — Z79899 Other long term (current) drug therapy: Secondary | ICD-10-CM | POA: Insufficient documentation

## 2012-10-21 DIAGNOSIS — M129 Arthropathy, unspecified: Secondary | ICD-10-CM | POA: Insufficient documentation

## 2012-10-21 DIAGNOSIS — M549 Dorsalgia, unspecified: Secondary | ICD-10-CM

## 2012-10-21 DIAGNOSIS — IMO0001 Reserved for inherently not codable concepts without codable children: Secondary | ICD-10-CM | POA: Insufficient documentation

## 2012-10-21 LAB — CBC WITH DIFFERENTIAL/PLATELET
Basophils Absolute: 0 10*3/uL (ref 0.0–0.1)
Eosinophils Relative: 0 % (ref 0–5)
HCT: 41.9 % (ref 36.0–46.0)
Lymphocytes Relative: 18 % (ref 12–46)
Lymphs Abs: 1.9 10*3/uL (ref 0.7–4.0)
MCV: 84 fL (ref 78.0–100.0)
Monocytes Absolute: 0.8 10*3/uL (ref 0.1–1.0)
Neutro Abs: 8 10*3/uL — ABNORMAL HIGH (ref 1.7–7.7)
RBC: 4.99 MIL/uL (ref 3.87–5.11)
RDW: 13.2 % (ref 11.5–15.5)
WBC: 10.7 10*3/uL — ABNORMAL HIGH (ref 4.0–10.5)

## 2012-10-21 LAB — URINALYSIS, ROUTINE W REFLEX MICROSCOPIC
Glucose, UA: NEGATIVE mg/dL
Hgb urine dipstick: NEGATIVE
Protein, ur: NEGATIVE mg/dL
Specific Gravity, Urine: >1.046 — ABNORMAL HIGH (ref 1.005–1.030)
pH: 6.5 (ref 5.0–8.0)

## 2012-10-21 LAB — COMPREHENSIVE METABOLIC PANEL
ALT: 20 U/L (ref 0–35)
AST: 16 U/L (ref 0–37)
CO2: 28 mEq/L (ref 19–32)
Calcium: 9.6 mg/dL (ref 8.4–10.5)
Chloride: 101 mEq/L (ref 96–112)
Creatinine, Ser: 0.58 mg/dL (ref 0.50–1.10)
GFR calc Af Amer: >90 mL/min (ref >90)
GFR calc non Af Amer: >90 mL/min (ref >90)
Glucose, Bld: 124 mg/dL — ABNORMAL HIGH (ref 70–99)
Sodium: 139 mEq/L (ref 135–145)
Total Bilirubin: 0.1 mg/dL — ABNORMAL LOW (ref 0.3–1.2)

## 2012-10-21 LAB — URINE MICROSCOPIC-ADD ON

## 2012-10-21 MED ORDER — METHYLPREDNISOLONE (PAK) 4 MG PO TABS: ORAL_TABLET | ORAL | Status: DC

## 2012-10-21 MED ORDER — HYDROMORPHONE HCL PF 2 MG/ML IJ SOLN
2.0000 mg | Freq: Once | INTRAMUSCULAR | Status: AC
  Administered 2012-10-21: 2 mg via INTRAMUSCULAR
  Filled 2012-10-21: qty 1

## 2012-10-21 MED ORDER — IOHEXOL 300 MG/ML  SOLN
50.0000 mL | Freq: Once | INTRAMUSCULAR | Status: AC | PRN
  Administered 2012-10-21: 50 mL via ORAL

## 2012-10-21 MED ORDER — IOHEXOL 300 MG/ML  SOLN
100.0000 mL | Freq: Once | INTRAMUSCULAR | Status: AC | PRN
  Administered 2012-10-21: 80 mL via INTRAVENOUS

## 2012-10-21 MED ORDER — CEPHALEXIN 500 MG PO CAPS: 500.0000 mg | ORAL_CAPSULE | Freq: Four times a day (QID) | ORAL | Status: DC

## 2012-10-21 NOTE — ED Notes (Signed)
Left hip pain states has been here this past weekend for same pain has taken pain meds given but still in pain has hx of surgery l4 l5 and has had MRI but pain still rads down to hip

## 2012-10-21 NOTE — ED Notes (Signed)
Pt has finished her contrast ct aware

## 2012-10-21 NOTE — ED Notes (Signed)
PT ambulated with baseline gait; VSS; A&Ox3; no signs of distress; respirations even and unlabored; skin warm and dry; no questions upon discharge.  

## 2012-10-21 NOTE — ED Notes (Signed)
Pt given a cup of ice water to drink; husband at bedside

## 2012-10-21 NOTE — ED Notes (Signed)
Walked to br for ua sample earlier only wants water to drink husband at bedside

## 2012-10-21 NOTE — ED Notes (Signed)
Pt made aware of need of urine specimen; pt up ambulatory at this time to attempt to provide an urine specimen

## 2012-10-21 NOTE — ED Notes (Signed)
Lactic acid results shown to Dr. Rancour 

## 2012-10-21 NOTE — ED Notes (Signed)
Pt ambulated to the bathroom and back to room without any assistance or difficulty

## 2012-10-21 NOTE — ED Provider Notes (Signed)
CSN: [id number]     Arrival date & time 10/21/12  0710 History     First MD Initiated Contact with Patient 10/21/12 [phone]     Chief Complaint  Patient presents with  . Back Pain   (Consider location/radiation/quality/duration/timing/severity/associated sxs/prior Treatment) HPI Comments: 3rd visit in 3 days for L low back and hip pain.  Has had lower L back pain since Friday when she got a facet injection. She received IM Dilaudid in the ER yesterday and the day before with partial relief. She taking Percocet at home with partial relief. She denies any fever, chills, nausea or vomiting. Denies any dysuria hematuria. Denies any focal weakness, numbness or tingling. She states still able to ambulate. She denies any bowel or bladder incontinence, injection drug use or history of cancer. She reports an MRI one month ago showed "fluid" in her spine. She denies any diarrhea.  The history is provided by the patient and a relative.    Past Medical History  Diagnosis Date  . Fibromyalgia   . 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 Arthritis   . Rheumatoid arthritis(714.0)     autoimmune arthritis; methotrexate once/week  . Depression     takes meds daily  . Post-nasal drip     hx of  . Other specified rheumatoid arthritis, right shoulder 08/01/2011  . Asthma   . Allergy   . Chronic kidney disease   . Urinary incontinence    Past Surgical History  Procedure Laterality Date  . Shoulder surgery  2012    right  . Back surgery  2011    lower back fusion l4-5, s1  . Bladder surgery  2009  . Tubal ligation  1988  . Breast enhancement surgery  2003  . Liposuction  2003    abdominal  . Tonsillectomy  1987  . Total shoulder arthroplasty  08/01/2011    Procedure: TOTAL SHOULDER ARTHROPLASTY;  Surgeon: Eulas Post, MD;  Location: [facility] OR;  Service: Orthopedics;  Laterality: Right;  Right total shoulder arthroplasty  . Mass excision  11/03/2011    Procedure: MINOR  EXCISION OF MASS;  Surgeon: Wyn Forster., MD;  Location: [facility];  Service: Orthopedics;  Laterality: Left;  debride IP joint, cyst excision left index   Family History  Problem Relation Age of Onset  . Anesthesia problems Neg Hx   . Hypotension Neg Hx   . Malignant hyperthermia Neg Hx   . Pseudochol deficiency Neg Hx   . Arthritis Mother   . Cancer Mother     breast  . Heart disease Mother   . COPD Father   . Hypertension Father   . Cancer Maternal Aunt 27    breast    History  Substance Use Topics  . Smoking status: Never Smoker   . Smokeless tobacco: Not on file  . Alcohol Use: No   OB History   Grav Para Term Preterm Abortions TAB SAB Ect Mult Living                 Review of Systems  Constitutional: Negative for activity change and appetite change.  HENT: Negative for congestion and rhinorrhea.   Respiratory: Negative for cough, chest tightness and shortness of breath.   Cardiovascular: Negative for chest pain.  Gastrointestinal: Positive for abdominal pain. Negative for nausea and vomiting.  Genitourinary: Negative for dysuria, hematuria, vaginal bleeding and vaginal discharge.  Musculoskeletal: Positive for  myalgias, back pain and arthralgias.  Skin: Negative for rash.  Neurological: Negative for dizziness, weakness and headaches.  A complete 10 system review of systems was obtained and all systems are negative except as noted in the HPI and PMH.    Allergies  Aspirin; Ibuprofen; Ativan; and Tramadol hcl  Home Medications   Current Outpatient Rx  Name  Route  Sig  Dispense  Refill  . ARIPiprazole (ABILIFY) 15 MG tablet   Oral   Take 7.5 mg by mouth daily.         [name] leflunomide (ARAVA) 20 MG tablet   Oral   Take 20 mg by mouth at bedtime.         . meloxicam (MOBIC) 15 MG tablet   Oral   Take 15 mg by mouth daily.          [name] oxyCODONE-acetaminophen (PERCOCET) 10-325 MG per tablet   Oral   Take 1 tablet by mouth every 4  (four) hours as needed for pain.         [name] oxyCODONE-acetaminophen (PERCOCET/ROXICET) 5-325 MG per tablet   Oral   Take 1 tablet by mouth every 4 (four) hours as needed for pain.   20 tablet   0   . tretinoin (RETIN-A) 0.1 % cream   Topical   Apply 1 application topically daily as needed (for acne).         . venlafaxine XR (EFFEXOR-XR) 150 MG 24 hr capsule   Oral   Take 150 mg by mouth daily.         [name] VYVANSE 70 MG capsule   Oral   Take 70 mg by mouth daily. Per Joeseph Amor, PA         . zolpidem (AMBIEN) 10 MG tablet   Oral   Take 10 mg by mouth at bedtime as needed for sleep. per Joeseph Amor, PA         . cephALEXin (KEFLEX) 500 MG capsule   Oral   Take 1 capsule (500 mg total) by mouth 4 (four) times daily.   40 capsule   0   . methylPREDNIsolone (MEDROL DOSPACK) 4 MG tablet      follow package directions   21 tablet   0    BP 164/90  Pulse 79  Temp(Src) 97.6 F (36.4 C) (Oral)  Resp 16  SpO2 100% Physical Exam  Constitutional: She is oriented to person, place, and time. She appears well-developed and well-nourished. No distress.  HENT:  Head: Normocephalic and atraumatic.  Mouth/Throat: Oropharynx is clear and moist. No oropharyngeal exudate.  Eyes: Conjunctivae and EOM are normal. Pupils are equal, round, and reactive to light.  Neck: Normal range of motion. Neck supple.  Cardiovascular: Normal rate, regular rhythm and normal heart sounds.   No murmur heard. Pulmonary/Chest: Effort normal and breath sounds normal. No respiratory distress.  Abdominal: Soft. There is tenderness. There is no rebound and no guarding.  TTP LLQ without guarding or rebound  Musculoskeletal: Normal range of motion. She exhibits tenderness.  TTP L paraspinal muscles, no midline pain. FROM L hip without pain.  5/5 strength in bilateral lower extremities. Ankle plantar and dorsiflexion intact. Great toe extension intact bilaterally. +2 DP and PT pulses. +2 patellar  reflexes bilaterally. Normal gait.   Neurological: She is alert and oriented to person, place, and time. No cranial nerve deficit. She exhibits normal muscle tone. Coordination normal.  Skin: Skin is warm. No rash noted.  No rash  ED Course   Procedures (including critical care time)  Labs Reviewed  URINALYSIS, ROUTINE W REFLEX MICROSCOPIC - Abnormal; Notable for the following:    Specific Gravity, Urine >1.046 (*)    Leukocytes, UA MODERATE (*)    All other components within normal limits  CBC WITH DIFFERENTIAL - Abnormal; Notable for the following:    WBC 10.7 (*)    Neutro Abs 8.0 (*)    All other components within normal limits  COMPREHENSIVE METABOLIC PANEL - Abnormal; Notable for the following:    Glucose, Bld 124 (*)    Total Bilirubin 0.1 (*)    All other components within normal limits  URINE MICROSCOPIC-ADD ON - Abnormal; Notable for the following:    Squamous Epithelial / LPF FEW (*)    All other components within normal limits  LIPASE, BLOOD  CG4 I-STAT (LACTIC ACID)   Ct Abdomen Pelvis W Contrast  10/21/2012   *RADIOLOGY REPORT*  Clinical Data: Left lower quadrant abdominal pain and left hip pain  CT ABDOMEN AND PELVIS WITH CONTRAST  Technique:  Multidetector CT imaging of the abdomen and pelvis was performed following the standard protocol during bolus administration of intravenous contrast.  Contrast: 80mL OMNIPAQUE IOHEXOL 300 MG/ML  SOLN  Comparison: Lumbar MRI 09/10/2012, CT 06/12/2009  Findings: Calcified lingular granuloma is noted.  Breast implants partly visualized.  Predominately sub centimeter hypointense liver lesions are re- identified.  Minimal apparent increase in size in now 1 cm cyst in the posterior segment right hepatic lobe; others are too small to definitively characterize and others are non visualized which may be seen with flash filling hemangiomata.  Gallbladder, adrenal glands, kidneys, spleen, and pancreas are normal.  Uterus and adnexa are  normal.  Presumed post menopausal small size of normal-appearing ovaries.  Bladder is normal.  Small fat containing right inguinal hernia.  No radiopaque renal or ureteral calculus.  No bowel wall thickening or focal segmental dilatation.  Appendix is normal.  Lumbar fusion hardware is re-identified spanning L4-L5.  No acute osseous abnormality or evidence for hardware failure.  IMPRESSION: No acute intra-abdominal or pelvic pathology.   Original Report Authenticated By: Christiana Pellant, M.D.   1. Back pain     MDM  4 days of left low back and hip pain that is worse. 2 previous evaluations this weekend. No focal deficits. No weakness, numbness or tingling. No bowel and bladder incontinence. No fever or vomiting.  On exam patient does have left lower quadrant pain which she says is radiating to the back. Will check labs.  No evidence of cauda equina or cord compression. Possible UTI. Culture sent. Able to ambulate. FROM L HIP.  No evidence of septic joint. Follow up as scheduled with ortho. Has pain meds at home.  Glynn Octave, MD 10/21/12 1754

## 2012-10-23 ENCOUNTER — Ambulatory Visit: Payer: 59 | Admitting: Internal Medicine

## 2012-10-24 ENCOUNTER — Encounter (HOSPITAL_COMMUNITY): Payer: Self-pay | Admitting: Emergency Medicine

## 2012-10-24 ENCOUNTER — Ambulatory Visit (INDEPENDENT_AMBULATORY_CARE_PROVIDER_SITE_OTHER): Payer: 59 | Admitting: Internal Medicine

## 2012-10-24 ENCOUNTER — Emergency Department (HOSPITAL_COMMUNITY): Payer: 59

## 2012-10-24 ENCOUNTER — Encounter: Payer: Self-pay | Admitting: Internal Medicine

## 2012-10-24 ENCOUNTER — Emergency Department (HOSPITAL_COMMUNITY)
Admission: EM | Admit: 2012-10-24 | Discharge: 2012-10-24 | Disposition: A | Discharge status: Home / Self Care | Payer: 59 | Attending: Emergency Medicine | Admitting: Emergency Medicine

## 2012-10-24 VITALS — BP 140/90 | HR 76 | Temp 97.9°F | Resp 20 | Wt 169.0 lb

## 2012-10-24 DIAGNOSIS — N189 Chronic kidney disease, unspecified: Secondary | ICD-10-CM | POA: Insufficient documentation

## 2012-10-24 DIAGNOSIS — M7612 Psoas tendinitis, left hip: Secondary | ICD-10-CM

## 2012-10-24 DIAGNOSIS — Z8739 Personal history of other diseases of the musculoskeletal system and connective tissue: Secondary | ICD-10-CM | POA: Insufficient documentation

## 2012-10-24 DIAGNOSIS — M6289 Other specified disorders of muscle: Secondary | ICD-10-CM

## 2012-10-24 DIAGNOSIS — M76899 Other specified enthesopathies of unspecified lower limb, excluding foot: Secondary | ICD-10-CM | POA: Insufficient documentation

## 2012-10-24 DIAGNOSIS — Z79899 Other long term (current) drug therapy: Secondary | ICD-10-CM | POA: Insufficient documentation

## 2012-10-24 DIAGNOSIS — Z87828 Personal history of other (healed) physical injury and trauma: Secondary | ICD-10-CM | POA: Insufficient documentation

## 2012-10-24 DIAGNOSIS — M797 Fibromyalgia: Secondary | ICD-10-CM

## 2012-10-24 DIAGNOSIS — Z8659 Personal history of other mental and behavioral disorders: Secondary | ICD-10-CM | POA: Insufficient documentation

## 2012-10-24 DIAGNOSIS — M2550 Pain in unspecified joint: Secondary | ICD-10-CM | POA: Insufficient documentation

## 2012-10-24 DIAGNOSIS — M545 Low back pain, unspecified: Secondary | ICD-10-CM | POA: Insufficient documentation

## 2012-10-24 DIAGNOSIS — M069 Rheumatoid arthritis, unspecified: Secondary | ICD-10-CM

## 2012-10-24 DIAGNOSIS — Z87448 Personal history of other diseases of urinary system: Secondary | ICD-10-CM | POA: Insufficient documentation

## 2012-10-24 DIAGNOSIS — F329 Major depressive disorder, single episode, unspecified: Secondary | ICD-10-CM | POA: Insufficient documentation

## 2012-10-24 DIAGNOSIS — IMO0001 Reserved for inherently not codable concepts without codable children: Secondary | ICD-10-CM

## 2012-10-24 DIAGNOSIS — R1032 Left lower quadrant pain: Secondary | ICD-10-CM

## 2012-10-24 DIAGNOSIS — J45909 Unspecified asthma, uncomplicated: Secondary | ICD-10-CM | POA: Insufficient documentation

## 2012-10-24 DIAGNOSIS — F3289 Other specified depressive episodes: Secondary | ICD-10-CM | POA: Insufficient documentation

## 2012-10-24 DIAGNOSIS — Z791 Long term (current) use of non-steroidal anti-inflammatories (NSAID): Secondary | ICD-10-CM | POA: Insufficient documentation

## 2012-10-24 DIAGNOSIS — K6812 Psoas muscle abscess: Secondary | ICD-10-CM | POA: Insufficient documentation

## 2012-10-24 DIAGNOSIS — M549 Dorsalgia, unspecified: Secondary | ICD-10-CM

## 2012-10-24 DIAGNOSIS — R0982 Postnasal drip: Secondary | ICD-10-CM | POA: Insufficient documentation

## 2012-10-24 LAB — URINALYSIS, ROUTINE W REFLEX MICROSCOPIC
Hgb urine dipstick: NEGATIVE
Specific Gravity, Urine: 1.01 (ref 1.005–1.030)
Urobilinogen, UA: 0.2 mg/dL (ref 0.0–1.0)

## 2012-10-24 LAB — URINE MICROSCOPIC-ADD ON

## 2012-10-24 MED ORDER — MEPERIDINE HCL 25 MG/ML IJ SOLN
50.0000 mg | Freq: Once | INTRAMUSCULAR | Status: AC
  Administered 2012-10-24: 50 mg via INTRAMUSCULAR

## 2012-10-24 MED ORDER — HYDROMORPHONE HCL PF 1 MG/ML IJ SOLN: 1.0000 mg | Freq: Once | INTRAMUSCULAR | Status: DC

## 2012-10-24 MED ORDER — PROMETHAZINE HCL 25 MG/ML IJ SOLN: 25.0000 mg | Freq: Once | INTRAMUSCULAR | Status: DC

## 2012-10-24 MED ORDER — PROMETHAZINE HCL 25 MG/ML IJ SOLN
25.0000 mg | Freq: Once | INTRAMUSCULAR | Status: DC
  Administered 2012-10-24: 25 mg via INTRAMUSCULAR

## 2012-10-24 MED ORDER — HYDROMORPHONE HCL PF 1 MG/ML IJ SOLN
1.0000 mg | Freq: Once | INTRAMUSCULAR | Status: DC
  Administered 2012-10-24: 1 mg via INTRAVENOUS
  Filled 2012-10-24: qty 1

## 2012-10-24 MED ORDER — MEPERIDINE HCL 50 MG/ML IJ SOLN: 50.0000 mg | Freq: Once | INTRAMUSCULAR | Status: DC

## 2012-10-24 MED ORDER — OXYCODONE-ACETAMINOPHEN 5-325 MG PO TABS
2.0000 | ORAL_TABLET | Freq: Once | ORAL | Status: DC
  Filled 2012-10-24: qty 2

## 2012-10-24 MED ORDER — HYDROMORPHONE HCL PF 1 MG/ML IJ SOLN
1.0000 mg | Freq: Once | INTRAMUSCULAR | Status: AC
  Administered 2012-10-24: 1 mg via INTRAMUSCULAR
  Filled 2012-10-24: qty 1

## 2012-10-24 MED ORDER — CYCLOBENZAPRINE HCL 10 MG PO TABS: 10.0000 mg | ORAL_TABLET | Freq: Three times a day (TID) | ORAL | Status: DC | PRN

## 2012-10-24 MED ORDER — OXYCODONE-ACETAMINOPHEN 5-325 MG PO TABS: 2.0000 | ORAL_TABLET | Freq: Once | ORAL | Status: DC

## 2012-10-24 NOTE — Patient Instructions (Signed)
Followup rheumatology Continue prednisone taper  Complete antibiotic therapy

## 2012-10-24 NOTE — Progress Notes (Signed)
Subjective:    Patient ID: Kara [name], female    DOB: 12-05-1955, 57 y.o.   MRN: [id number]  HPI  57 year old patient who is seen today for evaluation of back and abdominal pain. She was stable until 7 days ago when she developed pain in the left lumbar area. Pain radiates to the left mid abdominal quadrant. She also describes a burning numbness type dysesthesia involving her left anterior thigh. Due to the severity the pain the patient was seen in the ED on 3 consecutive days. She had temporary relief with.lot in and has also been on oxycodone. Presently she is on Keflex for a UTI and also a prednisone taper. She does have a history of RA as well as fibromyalgia. She has also been evaluated by 2 orthopedic groups and has received an epidural injection 6 days ago without benefit. ED evaluation included an abdominal and pelvic CT scan that revealed no evidence of nephrolithiasis. She has had a lumbar MRI recently. She states pain is alleviated somewhat by walking. She has a prior history of a fusion involving L4-L5. She is also followed by rheumatology  Past Medical History  Diagnosis Date  . Fibromyalgia   . 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 Arthritis   . Rheumatoid arthritis(714.0)     autoimmune arthritis; methotrexate once/week  . Depression     takes meds daily  . Post-nasal drip     hx of  . Other specified rheumatoid arthritis, right shoulder 08/01/2011  . Asthma   . Allergy   . Chronic kidney disease   . Urinary incontinence     History   Social History  . Marital Status: Married    Spouse Name: N/A    Number of Children: N/A  . Years of Education: N/A   Occupational History  . Not on file.   Social History Main Topics  . Smoking status: Never Smoker   . Smokeless tobacco: Not on file  . Alcohol Use: No  . Drug Use: Yes    Special: Marijuana     Comment: rarely  . Sexually Active: Yes    Birth Control/ Protection: Post-menopausal    Other Topics Concern  . Not on file   Social History Narrative  . No narrative on file    Past Surgical History  Procedure Laterality Date  . Shoulder surgery  2012    right  . Back surgery  2011    lower back fusion l4-5, s1  . Bladder surgery  2009  . Tubal ligation  1988  . Breast enhancement surgery  2003  . Liposuction  2003    abdominal  . Tonsillectomy  1987  . Total shoulder arthroplasty  08/01/2011    Procedure: TOTAL SHOULDER ARTHROPLASTY;  Surgeon: Eulas Post, MD;  Location: [facility] OR;  Service: Orthopedics;  Laterality: Right;  Right total shoulder arthroplasty  . Mass excision  11/03/2011    Procedure: MINOR EXCISION OF MASS;  Surgeon: Wyn Forster., MD;  Location: [facility];  Service: Orthopedics;  Laterality: Left;  debride IP joint, cyst excision left index    Family History  Problem Relation Age of Onset  . Anesthesia problems Neg Hx   . Hypotension Neg Hx   . Malignant hyperthermia Neg Hx   . Pseudochol deficiency Neg Hx   . Arthritis Mother   . Cancer Mother     breast  . Heart disease Mother   .  COPD Father   . Hypertension Father   . Cancer Maternal Aunt 27    breast     Allergies  Allergen Reactions  . Aspirin Anaphylaxis  . Ibuprofen Anaphylaxis  . Ativan (Lorazepam) Other (See Comments)    Makes agitated, combative   . Tramadol Hcl Itching and Rash    Current Outpatient Prescriptions on File Prior to Visit  Medication Sig Dispense Refill  . ARIPiprazole (ABILIFY) 15 MG tablet Take 7.5 mg by mouth daily.      . cephALEXin (KEFLEX) 500 MG capsule Take 1 capsule (500 mg total) by mouth 4 (four) times daily.  40 capsule  0  . leflunomide (ARAVA) 20 MG tablet Take 20 mg by mouth at bedtime.      . meloxicam (MOBIC) 15 MG tablet Take 15 mg by mouth daily.       . methylPREDNIsolone (MEDROL DOSPACK) 4 MG tablet follow package directions  21 tablet  0  . tretinoin (RETIN-A) 0.1 % cream Apply 1 application topically daily  as needed (for acne).      . venlafaxine XR (EFFEXOR-XR) 150 MG 24 hr capsule Take 150 mg by mouth daily.      [name] VYVANSE 70 MG capsule Take 70 mg by mouth daily. Per Joeseph Amor, PA      . zolpidem (AMBIEN) 10 MG tablet Take 10 mg by mouth at bedtime as needed for sleep. per Joeseph Amor, PA       No current facility-administered medications on file prior to visit.    BP 140/90  Pulse 76  Temp(Src) 97.9 F (36.6 C) (Oral)  Resp 20  Wt 169 lb (76.658 kg)  BMI 28.99 kg/m2  SpO2 98%       Review of Systems  Constitutional: Negative.   HENT: Negative for hearing loss, congestion, sore throat, rhinorrhea, dental problem, sinus pressure and tinnitus.   Eyes: Negative for pain, discharge and visual disturbance.  Respiratory: Negative for cough and shortness of breath.   Cardiovascular: Negative for chest pain, palpitations and leg swelling.  Gastrointestinal: Negative for nausea, vomiting, abdominal pain, diarrhea, constipation, blood in stool and abdominal distention.  Genitourinary: Negative for dysuria, urgency, frequency, hematuria, flank pain, vaginal bleeding, vaginal discharge, difficulty urinating, vaginal pain and pelvic pain.  Musculoskeletal: Positive for back pain. Negative for joint swelling, arthralgias and gait problem.  Skin: Negative for rash.  Neurological: Positive for numbness. Negative for dizziness, syncope, speech difficulty, weakness and headaches.  Hematological: Negative for adenopathy.  Psychiatric/Behavioral: Negative for behavioral problems, dysphoric mood and agitation. The patient is not nervous/anxious.        Objective:   Physical Exam  Constitutional: She appears well-developed and well-nourished. No distress.  Tearful Able to ambulate from a sitting position to the examining table without difficulty Afebrile  Abdominal: Soft. Bowel sounds are normal. She exhibits no distension. There is no rebound and no guarding.  Very mild tenderness along  the left abdominal wall regions No guarding or rebound bowel sounds active   Musculoskeletal:  Straight leg test negative Normal ankle flexion and extension Achilles reflexes brisk and equal bilaterally  Full range of motion of the hips          Assessment & Plan:   One-week history of left lumbar pain with radiation to the left abdominal region associated with dysesthesias of the left anterior thigh. Unremarkable clinical examination  Patient presently is on Keflex for a UTI and a prednisone taper. She has been prescribed oxycodone for pain.  Suggest followup with Dr. Dierdre Forth

## 2012-10-24 NOTE — ED Notes (Signed)
Pt states she cannot take percocet b/c she takes it every day and it does not help.  PA notified.

## 2012-10-24 NOTE — ED Provider Notes (Signed)
CSN: [id number]     Arrival date & time 10/24/12  1907 History  This chart was scribed for non-physician practitioner Dierdre Forth, PA-C, working with Gavin Pound. Oletta Lamas, MD, by Yevette Edwards, ED Scribe. This patient was seen in room TR06C/TR06C and the patient's care was started at 10:25 PM.   First MD Initiated Contact with Patient 10/24/12 2201     Chief Complaint  Patient presents with  . Groin Pain    Patient is a 57 y.o. female presenting with groin pain. The history is provided by the patient. No language interpreter was used.  Groin Pain This is a recurrent problem. The current episode started more than 2 days ago. The problem occurs constantly. The problem has not changed since onset.Pertinent negatives include no chest pain, no abdominal pain, no headaches and no shortness of breath. The symptoms are aggravated by walking and standing. The symptoms are relieved by medications.   HPI Comments: Kara [name] is a 57 y.o. female, with a h/o fibromyalgia and arthritis, who presents to the Emergency Department complaining of recurrent pain to her groin which began one week ago. Patient has been seen 5 times in the past few weeks for same complaint. She's had extensive workup for her intra-abdominal pathology, possible nephrolithiasis. She has been worked up by her primary care physician and orthopedist without result. Patient saw her primary care physician today who discharged her with Flexeril. She has also been evaluated in the ED and at the orthopedist today as well.  She reports that the groin pain radiates to her sacrum.  The pt describes the pain as "unbearable," "flares," and "burning." The pt states that her left anterior thigh is numb. She likens the pain to childbirth with the feeling that her pelvis is opening, and she reports that her pain has kept her from sleeping well. The pt states that when her pain began, she had previously performed no new or strenuous activities, had not  experienced any injuries, or had not experienced any traumas to the affected area. She states that the pain is worse with movement and standing. The pt denies experiencing any dysuria. The pt had been seen at this ED previously today, as well as six times recently, for similar symptoms. She has also visited her PCP, an orthopedist, and an neurologist, for similar symptoms, but without any resolution. She reports that she was turned away from [facility], and was not treated there after being evaluated by their PA. The pt expressed concern that she was being treated like a narcotics-seeker.   Past Medical History  Diagnosis Date  . Fibromyalgia   . 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 Arthritis   . Rheumatoid arthritis(714.0)     autoimmune arthritis; methotrexate once/week  . Depression     takes meds daily  . Post-nasal drip     hx of  . Other specified rheumatoid arthritis, right shoulder 08/01/2011  . Asthma   . Allergy   . Chronic kidney disease   . Urinary incontinence    Past Surgical History  Procedure Laterality Date  . Shoulder surgery  2012    right  . Back surgery  2011    lower back fusion l4-5, s1  . Bladder surgery  2009  . Tubal ligation  1988  . Breast enhancement surgery  2003  . Liposuction  2003    abdominal  . Tonsillectomy  1987  . Total shoulder arthroplasty  08/01/2011    Procedure: TOTAL SHOULDER ARTHROPLASTY;  Surgeon: Eulas Post, MD;  Location: [facility] OR;  Service: Orthopedics;  Laterality: Right;  Right total shoulder arthroplasty  . Mass excision  11/03/2011    Procedure: MINOR EXCISION OF MASS;  Surgeon: Wyn Forster., MD;  Location: [facility];  Service: Orthopedics;  Laterality: Left;  debride IP joint, cyst excision left index   Family History  Problem Relation Age of Onset  . Anesthesia problems Neg Hx   . Hypotension Neg Hx   . Malignant hyperthermia Neg Hx   . Pseudochol deficiency Neg  Hx   . Arthritis Mother   . Cancer Mother     breast  . Heart disease Mother   . COPD Father   . Hypertension Father   . Cancer Maternal Aunt 27    breast    History  Substance Use Topics  . Smoking status: Never Smoker   . Smokeless tobacco: Not on file  . Alcohol Use: No   No OB history provided.  Review of Systems  Constitutional: Negative for fever, diaphoresis, appetite change, fatigue and unexpected weight change.  HENT: Negative for mouth sores and neck stiffness.   Eyes: Negative for visual disturbance.  Respiratory: Negative for cough, chest tightness, shortness of breath and wheezing.   Cardiovascular: Negative for chest pain.  Gastrointestinal: Negative for nausea, vomiting, abdominal pain, diarrhea and constipation.  Endocrine: Negative for polydipsia, polyphagia and polyuria.  Genitourinary: Negative for dysuria, urgency, frequency and hematuria.  Musculoskeletal: Positive for myalgias (Groin pain. ), back pain and arthralgias.  Skin: Negative for rash.  Allergic/Immunologic: Negative for immunocompromised state.  Neurological: Negative for syncope, light-headedness and headaches.  Hematological: Does not bruise/bleed easily.  Psychiatric/Behavioral: Negative for sleep disturbance. The patient is not nervous/anxious.   All other systems reviewed and are negative.    Allergies  Aspirin; Ibuprofen; Ativan; and Tramadol hcl  Home Medications   Current Outpatient Rx  Name  Route  Sig  Dispense  Refill  . amphetamine-dextroamphetamine (ADDERALL) 20 MG tablet   Oral   Take 20 mg by mouth 3 (three) times daily.         . ARIPiprazole (ABILIFY) 15 MG tablet   Oral   Take 7.5 mg by mouth daily.         . cephALEXin (KEFLEX) 500 MG capsule   Oral   Take 500 mg by mouth 4 (four) times daily.         . cyclobenzaprine (FLEXERIL) 10 MG tablet   Oral   Take 1 tablet (10 mg total) by mouth 3 (three) times daily as needed for muscle spasms.   30 tablet    0   . leflunomide (ARAVA) 20 MG tablet   Oral   Take 20 mg by mouth at bedtime.         . meloxicam (MOBIC) 15 MG tablet   Oral   Take 15 mg by mouth daily.          . methylPREDNISolone (MEDROL DOSEPAK) 4 MG tablet   Oral   Take 4 mg by mouth daily. Day 1: 20 mg; Day 2: 16 mg, Day 3: 12 mg; Day 2: 8 mg; Day 1: 4 mg         . tretinoin (RETIN-A) 0.1 % cream   Topical   Apply 1 application topically daily as needed (for acne).         . venlafaxine XR (EFFEXOR-XR) 150 MG 24  hr capsule   Oral   Take 150 mg by mouth daily.         [name] VYVANSE 70 MG capsule   Oral   Take 70 mg by mouth daily. Per Joeseph Amor, PA         . zolpidem (AMBIEN) 10 MG tablet   Oral   Take 10 mg by mouth at bedtime as needed for sleep. per Joeseph Amor, PA          Triage Vitals: BP 152/84  Temp(Src) 98.1 F (36.7 C) (Oral)  Resp 18  SpO2 96%  Physical Exam  Nursing note and vitals reviewed. Constitutional: She appears well-developed and well-nourished. No distress.  Awake, alert, nontoxic appearance  HENT:  Head: Normocephalic and atraumatic.  Mouth/Throat: Oropharynx is clear and moist. No oropharyngeal exudate.  Eyes: Conjunctivae are normal. No scleral icterus.  Neck: Normal range of motion. Neck supple.  Full ROM without pain  Cardiovascular: Normal rate, regular rhythm, normal heart sounds and intact distal pulses.   Pulmonary/Chest: Effort normal and breath sounds normal. No respiratory distress. She has no wheezes.  Abdominal: Soft. Bowel sounds are normal. She exhibits no distension and no mass. There is no tenderness. There is no rebound and no guarding.  Musculoskeletal: Normal range of motion. She exhibits tenderness. She exhibits no edema.  Pain to palpation of the Illiopsoas muscle and in the groin with resisted hip flexion Full ROM of the L hip  Ambulates without difficulty  TTP at Illiopsoas insertion on the Lesser Trochanter  Lymphadenopathy:    She has no  cervical adenopathy.  Neurological: She is alert. She has normal reflexes.  Speech is clear and goal oriented Moves extremities without ataxia  Skin: Skin is warm and dry. No rash noted. She is not diaphoretic. No erythema.  Psychiatric: She has a normal mood and affect.    ED Course   DIAGNOSTIC STUDIES: Oxygen Saturation is 96% on room air, normal by my interpretation.    COORDINATION OF CARE:  10:31 PM- Discussed treatment plan with patient which included referring the pt to have an MRI of her hip performed and treating her with Dilaudid, and the patient agreed to the plan.   Procedures (including critical care time)  Labs Reviewed - No data to display Dg Hip Complete Left  10/24/2012   *RADIOLOGY REPORT*  Clinical Data: Hip pain.  Left groin pain.  LEFT HIP - COMPLETE 2+ VIEW  Comparison: None.  Findings: Hip joints and SI joints are symmetric and unremarkable. No acute bony abnormality.  Specifically, no fracture, subluxation, or dislocation.  Soft tissues are intact.  Postsurgical changes in the lower lumbar spine.  IMPRESSION: No acute bony abnormality.   Original Report Authenticated By: Charlett Nose, M.D.   1. Psoas tendinitis of left side   2. Rheumatoid arthritis   3. Fibromyalgia   4. ARTHRALGIA   5. LOW BACK PAIN     MDM  Kara [name] presents with complaints of left groin pain.  Patient demands Dilaudid, hospital admission and the diagnosis before leaving tonight. She's had extensive workup for her intra-abdominal pain, back pain and hip pain. She has been to the emergency department 6 times in the last week, has been evaluated by her primary care physician, her neurosurgeon and her orthopedist.  Patient presents today without new trauma and without change in her symptoms.  He is tearful, angry and demanding throughout her time here in the emergency department.  And explained to her the scope  practice of an emergency room, the fact that no life-threatening emergencies  have been found tonight on evaluation were previous visits.  I have explained the emergency department can and we'll no longer write any further pain management for her and that she must followup with her primary care, her orthopedist or a pain management clinic.   At this time there does not appear to be any evidence of an acute emergency medical condition and the patient appears stable for discharge with appropriate outpatient follow up. Diagnosis was discussed with patient who verbalizes understanding but expresses her frustration repeatedly and continues to ask for a narcotic prescription.    I have also discussed reasons to return immediately to the ER.  Patient expresses understanding and agrees with plan.  I personally performed the services described in this documentation, which was scribed in my presence. The recorded information has been reviewed and is accurate.   Dahlia Client Vivienne Sangiovanni, PA-C 10/25/12 0100

## 2012-10-24 NOTE — ED Provider Notes (Signed)
CSN: [id number]     Arrival date & time 10/24/12  1325 History     First MD Initiated Contact with Patient 10/24/12 1409     Chief Complaint  Patient presents with  . Flank Pain   (Consider location/radiation/quality/duration/timing/severity/associated sxs/prior Treatment) The history is provided by medical records and the patient. No language interpreter was used.    61 female with past medical history of fibromyalgia, rheumatoid arthritis presents emergency Department with chief complaint of her hip and low back pain.  Patient has been seen 4 times in the past few weeks for same complaint.  She's had extensive workup for her intra-abdominal pathology, possible nephrolithiasis.  She has been worked up by her primary care physician and orthopedist without result.  Patient saw her primary care physician today who discharged her with Flexeril.  Patient states that she feels like it "ovarian pain."  The patient complains of pain in the abdomen and it radiates to the groin lower back and include.  All on the left side.  She denies any urinary symptoms.  Patient states she is unable to sleep as she is extremely worried.  She has not been to work this week.  Pain is worse with movement, standing, patient's weight is difficult for her to stand fully erect.   Past Medical History  Diagnosis Date  . Fibromyalgia   . 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 Arthritis   . Rheumatoid arthritis(714.0)     autoimmune arthritis; methotrexate once/week  . Depression     takes meds daily  . Post-nasal drip     hx of  . Other specified rheumatoid arthritis, right shoulder 08/01/2011  . Asthma   . Allergy   . Chronic kidney disease   . Urinary incontinence    Past Surgical History  Procedure Laterality Date  . Shoulder surgery  2012    right  . Back surgery  2011    lower back fusion l4-5, s1  . Bladder surgery  2009  . Tubal ligation  1988  . Breast enhancement  surgery  2003  . Liposuction  2003    abdominal  . Tonsillectomy  1987  . Total shoulder arthroplasty  08/01/2011    Procedure: TOTAL SHOULDER ARTHROPLASTY;  Surgeon: Eulas Post, MD;  Location: [facility] OR;  Service: Orthopedics;  Laterality: Right;  Right total shoulder arthroplasty  . Mass excision  11/03/2011    Procedure: MINOR EXCISION OF MASS;  Surgeon: Wyn Forster., MD;  Location: [facility];  Service: Orthopedics;  Laterality: Left;  debride IP joint, cyst excision left index   Family History  Problem Relation Age of Onset  . Anesthesia problems Neg Hx   . Hypotension Neg Hx   . Malignant hyperthermia Neg Hx   . Pseudochol deficiency Neg Hx   . Arthritis Mother   . Cancer Mother     breast  . Heart disease Mother   . COPD Father   . Hypertension Father   . Cancer Maternal Aunt 27    breast    History  Substance Use Topics  . Smoking status: Never Smoker   . Smokeless tobacco: Not on file  . Alcohol Use: No   OB History   Grav Para Term Preterm Abortions TAB SAB Ect Mult Living                 Review of Systems Ten systems reviewed and are  negative for acute change, except as noted in the HPI.   Allergies  Aspirin; Ibuprofen; Ativan; and Tramadol hcl  Home Medications   Current Outpatient Rx  Name  Route  Sig  Dispense  Refill  . amphetamine-dextroamphetamine (ADDERALL) 20 MG tablet   Oral   Take 20 mg by mouth 3 (three) times daily.         . ARIPiprazole (ABILIFY) 15 MG tablet   Oral   Take 7.5 mg by mouth daily.         . cephALEXin (KEFLEX) 500 MG capsule   Oral   Take 500 mg by mouth 4 (four) times daily.         . cyclobenzaprine (FLEXERIL) 10 MG tablet   Oral   Take 1 tablet (10 mg total) by mouth 3 (three) times daily as needed for muscle spasms.   30 tablet   0   . leflunomide (ARAVA) 20 MG tablet   Oral   Take 20 mg by mouth at bedtime.         . meloxicam (MOBIC) 15 MG tablet   Oral   Take 15 mg by mouth  daily.          . methylPREDNISolone (MEDROL DOSEPAK) 4 MG tablet   Oral   Take 4 mg by mouth daily. Day 1: 20 mg; Day 2: 16 mg, Day 3: 12 mg; Day 2: 8 mg; Day 1: 4 mg         . tretinoin (RETIN-A) 0.1 % cream   Topical   Apply 1 application topically daily as needed (for acne).         . venlafaxine XR (EFFEXOR-XR) 150 MG 24 hr capsule   Oral   Take 150 mg by mouth daily.         [name] VYVANSE 70 MG capsule   Oral   Take 70 mg by mouth daily. Per Joeseph Amor, PA         . zolpidem (AMBIEN) 10 MG tablet   Oral   Take 10 mg by mouth at bedtime as needed for sleep. per Joeseph Amor, PA          BP 150/85  Pulse 93  Temp(Src) 98.5 F (36.9 C) (Oral)  Resp 20  SpO2 97% Physical Exam Physical Exam  Nursing note and vitals reviewed. Constitutional: She is oriented to person, place, and time. She appears well-developed and well-nourished. Tearful.  HENT:  Head: Normocephalic and atraumatic.  Eyes: Conjunctivae normal and EOM are normal. Pupils are equal, round, and reactive to light. No scleral icterus.  Neck: Normal range of motion.  Cardiovascular: Normal rate, regular rhythm and normal heart sounds.  Exam reveals no gallop and no friction rub.   No murmur heard. Pulmonary/Chest: Effort normal and breath sounds normal. No respiratory distress.  Abdominal: Soft. Bowel sounds are normal. She exhibits no distension and no mass. There is no tenderness. There is no guarding.  Musculoskeletal: TTP of the psoas and illiacus with direct palpation. Muscle testing with active flexion of the hip against resistance reproduces her pain. Patient is ttp at [employer] insertion on the [employer].  She is without pain with PROM of the hip, active ROM of the hip elicits pain. Neurological: She is alert and oriented to person, place, and time.  Skin: Skin is warm and dry. She is not diaphoretic.    ED Course   Procedures (including critical care time)  Labs Reviewed   URINALYSIS, ROUTINE W REFLEX  MICROSCOPIC - Abnormal; Notable for the following:    Leukocytes, UA SMALL (*)    All other components within normal limits  URINE MICROSCOPIC-ADD ON   No results found. 1. Psoas syndrome   2. Back pain   3. Deep groin pain, left     MDM  Patient with extensive workup. I believe it is musculoskeletal in nature. Doubt intraabdominal pathology, psoas abscess. Patient is afebrile with normal vitals. Hip exray negative for AVN or fracture. Patient discharged after pain control here in the ED. i have discussed the need for her to follow up with her rheumatologist and the ED policy on chronic pain. Supportive care discussed/ The patient appears reasonably screened and/or stabilized for discharge and I doubt any other medical condition or other [facility] requiring further screening, evaluation, or treatment in the ED at this time prior to discharge.   Arthor Captain, PA-C 10/24/12 1658

## 2012-10-24 NOTE — ED Notes (Signed)
Pt reporting left groin and flank pain.  Pt has been seen here multiple times for same.  Sts no one is telling her what is wrong with her.  Pt tearful and frustrated during assessment.  Pt sts she was kicked out of her doctor's office today and that has her visibly upset.

## 2012-10-24 NOTE — ED Notes (Signed)
Pt c/o left flank pain x 1 week; pt sts seen here for same and given antibiotics for UTI; pt sts continued pain and wants some pain medicine; pt sts PCP will not prescribe

## 2012-10-24 NOTE — ED Notes (Signed)
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  Spoke with pt at great length and figured out plan to work with her ortho office to help her with her stretches.

## 2012-10-24 NOTE — ED Notes (Signed)
PT. REPORTS PERSISTENT LEFT GROIN AND LOW BACK PAIN SEEN HERE THIS AFTERNOON RECEIVED DILAUDID / X- RAY DONE . CURRENTLY TAKING ORAL ANTIBIOTIC FOR UTI.

## 2012-10-24 NOTE — ED Provider Notes (Signed)
Medical screening examination/treatment/procedure(s) were performed by non-physician practitioner and as supervising physician I was immediately available for consultation/collaboration.   Glynn Octave, MD 10/24/12 [phone]

## 2012-10-25 NOTE — ED Provider Notes (Signed)
Medical screening examination/treatment/procedure(s) were performed by non-physician practitioner and as supervising physician I was immediately available for consultation/collaboration.   Kara [name]. Lenville Hibberd, MD 10/25/12 1544

## 2012-11-15 ENCOUNTER — Ambulatory Visit: Payer: 59 | Admitting: Family Medicine

## 2012-11-20 ENCOUNTER — Telehealth: Payer: Self-pay

## 2012-11-20 ENCOUNTER — Ambulatory Visit (INDEPENDENT_AMBULATORY_CARE_PROVIDER_SITE_OTHER): Payer: 59 | Admitting: Family Medicine

## 2012-11-20 ENCOUNTER — Other Ambulatory Visit (HOSPITAL_COMMUNITY)
Admission: RE | Admit: 2012-11-20 | Discharge: 2012-11-20 | Disposition: A | Discharge status: Home / Self Care | Payer: 59 | Source: Ambulatory Visit | Attending: Family Medicine | Admitting: Family Medicine

## 2012-11-20 ENCOUNTER — Encounter: Payer: Self-pay | Admitting: Family Medicine

## 2012-11-20 VITALS — BP 136/76 | HR 100 | Temp 98.4°F | Wt 164.0 lb

## 2012-11-20 DIAGNOSIS — Z124 Encounter for screening for malignant neoplasm of cervix: Secondary | ICD-10-CM

## 2012-11-20 DIAGNOSIS — R202 Paresthesia of skin: Secondary | ICD-10-CM

## 2012-11-20 DIAGNOSIS — R5383 Other fatigue: Secondary | ICD-10-CM

## 2012-11-20 DIAGNOSIS — R5381 Other malaise: Secondary | ICD-10-CM

## 2012-11-20 DIAGNOSIS — R209 Unspecified disturbances of skin sensation: Secondary | ICD-10-CM

## 2012-11-20 DIAGNOSIS — Z01419 Encounter for gynecological examination (general) (routine) without abnormal findings: Secondary | ICD-10-CM | POA: Insufficient documentation

## 2012-11-20 MED ORDER — TRETINOIN 0.1 % EX CREA: 1.0000 "application " | TOPICAL_CREAM | Freq: Every day | CUTANEOUS | Status: DC | PRN

## 2012-11-20 NOTE — Telephone Encounter (Signed)
FYI Pt wanted you to know that she will be going to a pain clinic in September. Pt stated she just wanted one office handling her pain medication.

## 2012-11-20 NOTE — Progress Notes (Signed)
Subjective:    Patient ID: Kara [name], female    DOB: Oct 06, 1955, 57 y.o.   MRN: [id number]  HPI Patient is here with multiple issues. She has chronic medical problems including history of rheumatoid arthritis, kidney stones, fibromyalgia, and ADD She had multiple recent emergency room visits for lumbar back pain with severe left thigh pain and numbness. She has been followed actively by orthopedists and states she had recent MRI of lumbar spine which did not show any acute changes or surgical issues. She reports having epidural injection about 6 days ago. Her back and thigh pain is actually improved greatly but she's had some persistent numbness left thigh. No clear weakness. No urine or stool incontinence.  She is followed regularly by rheumatologist and currently treated with Arava.  She is complaining of extreme fatigue. She thinks some of this may be related to this medication. Generally sleeping well. In fact, frequently sleeps 10-12 hours per day. Recent TSH normal. Prior B12 normal. Denies depression symptoms. No suspicion for obstructive sleep apnea. She had recent lab work per her rheumatologist which is basically unremarkable. She has made no recent dietary changes. Denies any headaches, fever, abdominal pain, chest pains, dyspnea, dysuria.  Patient also requesting Pap smear. She is postmenopausal. She had physical last winter and had discussed getting gynecologist but has changed her mind and wishes to consolidate most of her care here. No history of abnormal Pap smear. Last Pap smear reported about 2 years ago. Denies any recent vaginal spotting or discharge. Recent mammogram unremarkable  Past Medical History  Diagnosis Date  . Fibromyalgia   . 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 Arthritis   . Rheumatoid arthritis(714.0)     autoimmune arthritis; methotrexate once/week  . Depression     takes meds daily  . Post-nasal drip     hx of  .  Other specified rheumatoid arthritis, right shoulder 08/01/2011  . Asthma   . Allergy   . Chronic kidney disease   . Urinary incontinence    Past Surgical History  Procedure Laterality Date  . Shoulder surgery  2012    right  . Back surgery  2011    lower back fusion l4-5, s1  . Bladder surgery  2009  . Tubal ligation  1988  . Breast enhancement surgery  2003  . Liposuction  2003    abdominal  . Tonsillectomy  1987  . Total shoulder arthroplasty  08/01/2011    Procedure: TOTAL SHOULDER ARTHROPLASTY;  Surgeon: Eulas Post, MD;  Location: [facility] OR;  Service: Orthopedics;  Laterality: Right;  Right total shoulder arthroplasty  . Mass excision  11/03/2011    Procedure: MINOR EXCISION OF MASS;  Surgeon: Wyn Forster., MD;  Location: [facility];  Service: Orthopedics;  Laterality: Left;  debride IP joint, cyst excision left index    reports that she has never smoked. She does not have any smokeless tobacco history on file. She reports that she uses illicit drugs (Marijuana). She reports that she does not drink alcohol. family history includes Arthritis in her mother; COPD in her father; Cancer in her mother; Cancer (age of onset: 58) in her maternal aunt; Heart disease in her mother; Hypertension in her father. There is no history of Anesthesia problems, Hypotension, Malignant hyperthermia, or Pseudochol deficiency. Allergies  Allergen Reactions  . Aspirin Anaphylaxis  . Ibuprofen Anaphylaxis  . Ativan [Lorazepam] Other (See Comments)  Makes agitated, combative   . Tramadol Hcl Itching and Rash      Review of Systems  Constitutional: Positive for fatigue. Negative for fever, chills, appetite change and unexpected weight change.  HENT: Negative for trouble swallowing.   Respiratory: Negative for cough and shortness of breath.   Cardiovascular: Negative for chest pain, palpitations and leg swelling.  Gastrointestinal: Negative for nausea, vomiting, abdominal pain  and diarrhea.  Endocrine: Negative for polydipsia and polyuria.  Genitourinary: Negative for dysuria, vaginal bleeding and vaginal discharge.  Musculoskeletal: Positive for back pain and arthralgias. Negative for joint swelling.  Skin: Negative for rash.  Neurological: Positive for numbness. Negative for dizziness, syncope and weakness.  Hematological: Negative for adenopathy. Does not bruise/bleed easily.       Objective:   Physical Exam  Constitutional: She appears well-developed and well-nourished.  Neck: Neck supple. No thyromegaly present.  Cardiovascular: Normal rate and regular rhythm.   Pulmonary/Chest: Effort normal and breath sounds normal. No respiratory distress. She has no wheezes. She has no rales.  Genitourinary: Vagina normal.  Normal external genitalia. Vaginal mucosa is normal. She has minimal thin vaginal discharge slightly yellow in color. Cervix normal in appearance. Pap smear obtained. Bimanual exam unremarkable. No adnexal masses or tenderness.  Musculoskeletal: She exhibits no edema.  Neurological:  Patient has 2+ reflexes knee and ankle bilaterally. She is full-strength lower extremities. She has subjective impairment sensory function left anterior thigh. Good distal foot pulses  Skin: No rash noted.  Psychiatric: She has a normal mood and affect. Her behavior is normal.          Assessment & Plan:  #1 paresthesias left anterior thigh. She has a long history of lumbosacral disc disease. Reportedly MRI recently did not reveal any surgical type process. We explained numbness may take several months to return. Since her pain is greatly improved and she has no weakness we've recommended observation. She'll continue followup with orthopedist #2 fatigue. Likely multifactorial. Recent labs unremarkable. No clinical suspicion for obstructive sleep apnea. No depression. Question medication related. #3 health maintenance. Pap smear obtained as above. Continue yearly  mammograms.

## 2012-11-20 NOTE — Patient Instructions (Signed)

## 2012-12-04 ENCOUNTER — Other Ambulatory Visit: Payer: Self-pay | Admitting: Pain Medicine

## 2012-12-04 DIAGNOSIS — M545 Low back pain, unspecified: Secondary | ICD-10-CM

## 2012-12-10 ENCOUNTER — Ambulatory Visit
Admission: RE | Admit: 2012-12-10 | Discharge: 2012-12-10 | Disposition: A | Discharge status: Home / Self Care | Payer: 59 | Source: Ambulatory Visit | Attending: Pain Medicine | Admitting: Pain Medicine

## 2012-12-10 DIAGNOSIS — M545 Low back pain, unspecified: Secondary | ICD-10-CM

## 2012-12-11 ENCOUNTER — Ambulatory Visit (INDEPENDENT_AMBULATORY_CARE_PROVIDER_SITE_OTHER): Payer: 59 | Admitting: Family Medicine

## 2012-12-11 ENCOUNTER — Encounter: Payer: Self-pay | Admitting: Family Medicine

## 2012-12-11 VITALS — BP 130/80 | HR 80 | Temp 98.2°F | Resp 18 | Ht 64.0 in | Wt 161.0 lb

## 2012-12-11 DIAGNOSIS — N951 Menopausal and female climacteric states: Secondary | ICD-10-CM

## 2012-12-11 DIAGNOSIS — R5381 Other malaise: Secondary | ICD-10-CM

## 2012-12-11 DIAGNOSIS — R5383 Other fatigue: Secondary | ICD-10-CM

## 2012-12-11 MED ORDER — CONJ ESTROG-MEDROXYPROGEST ACE 0.45-1.5 MG PO TABS: 1.0000 | ORAL_TABLET | Freq: Every day | ORAL | Status: DC

## 2012-12-11 NOTE — Progress Notes (Signed)
Subjective:    Patient ID: Kara [name], female    DOB: 22-Apr-1955, 57 y.o.   MRN: [id number]  HPI  Patient here to discuss fatigue issues. Refer to prior note. She has chronic problems- history rheumatoid arthritis, chronic low back pain, fibromyalgia, history depression, and ADD. She was taken off arava which she felt was causing some of her fatigue issues and she has not seen any improvement since making this change. She'll be started soon on Humira  She takes both Prozac and Effexor XR for her depression per psychiatry. She rarely takes hydrocodone for her pain. Does not take Flexeril consistently.  Generally sleeping well. No recent chest pains. No dyspnea.  She has persistent hot flashes and states she went through menopause age 65. She specifically is requesting hormone replacement therapy. Recent mammogram normal. No history of blood clots. No history of stroke. Nonsmoker. History of BRCA1 and BRCA2 testing which were negative.  Past Medical History  Diagnosis Date  . Fibromyalgia   . 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 Arthritis   . Rheumatoid arthritis(714.0)     autoimmune arthritis; methotrexate once/week  . Depression     takes meds daily  . Post-nasal drip     hx of  . Other specified rheumatoid arthritis, right shoulder 08/01/2011  . Asthma   . Allergy   . Chronic kidney disease   . Urinary incontinence    Past Surgical History  Procedure Laterality Date  . Shoulder surgery  2012    right  . Back surgery  2011    lower back fusion l4-5, s1  . Bladder surgery  2009  . Tubal ligation  1988  . Breast enhancement surgery  2003  . Liposuction  2003    abdominal  . Tonsillectomy  1987  . Total shoulder arthroplasty  08/01/2011    Procedure: TOTAL SHOULDER ARTHROPLASTY;  Surgeon: Eulas Post, MD;  Location: [facility] OR;  Service: Orthopedics;  Laterality: Right;  Right total shoulder arthroplasty  . Mass excision  11/03/2011   Procedure: MINOR EXCISION OF MASS;  Surgeon: Wyn Forster., MD;  Location:  [facility];  Service: Orthopedics;  Laterality: Left;  debride IP joint, cyst excision left index    reports that she has never smoked. She does not have any smokeless tobacco history on file. She reports that she uses illicit drugs (Marijuana). She reports that she does not drink alcohol. family history includes Arthritis in her mother; COPD in her father; Cancer in her mother; Cancer (age of onset: 66) in her maternal aunt; Heart disease in her mother; Hypertension in her father. There is no history of Anesthesia problems, Hypotension, Malignant hyperthermia, or Pseudochol deficiency. Allergies  Allergen Reactions  . Aspirin Anaphylaxis  . Ibuprofen Anaphylaxis  . Ativan [Lorazepam] Other (See Comments)    Makes agitated, combative   . Tramadol Hcl Itching and Rash     Review of Systems  Constitutional: Positive for fatigue. Negative for chills and unexpected weight change.  Eyes: Negative for visual disturbance.  Respiratory: Negative for cough, chest tightness, shortness of breath and wheezing.   Cardiovascular: Negative for chest pain, palpitations and leg swelling.  Endocrine: Negative for polydipsia and polyuria.  Genitourinary: Negative for dysuria.  Musculoskeletal: Positive for arthralgias.  Neurological: Negative for dizziness, seizures, syncope, weakness, light-headedness and headaches.       Objective:   Physical Exam  Constitutional: She is oriented to person, place,  and time. She appears well-developed and well-nourished.  HENT:  Mouth/Throat: Oropharynx is clear and moist.  Neck: Neck supple. No thyromegaly present.  Cardiovascular: Normal rate and regular rhythm.   Pulmonary/Chest: Effort normal and breath sounds normal. No respiratory distress. She has no wheezes. She has no rales.  Musculoskeletal: She exhibits no edema.  Neurological: She is alert and oriented to  person, place, and time. No cranial nerve deficit.  Psychiatric: She has a normal mood and affect. Her behavior is normal.          Assessment & Plan:  Fatigue.  Likely multifactorial. Recent labs have been normal. Question if some of this is related to two different serotonin drugs. She does not have evidence for serotonin syndrome. She is also not getting any consistent exercise.  Postmenopausal hot flashes. We had extensive discussion regarding pros and cons of estrogen therapy. She has uterus and  would have to be on combination therapy with estrogen and progesterone. She does not have any contraindications. We agreed on  low-dose Prempro 0.45 mg and reassess 3 months. We reviewed possible side effects and risks.

## 2012-12-11 NOTE — Patient Instructions (Addendum)

## 2013-01-06 ENCOUNTER — Other Ambulatory Visit: Payer: Self-pay | Admitting: Pain Medicine

## 2013-01-06 DIAGNOSIS — M79601 Pain in right arm: Secondary | ICD-10-CM

## 2013-01-06 DIAGNOSIS — M542 Cervicalgia: Secondary | ICD-10-CM

## 2013-01-11 ENCOUNTER — Ambulatory Visit
Admission: RE | Admit: 2013-01-11 | Discharge: 2013-01-11 | Disposition: A | Discharge status: Home / Self Care | Payer: 59 | Source: Ambulatory Visit | Attending: Pain Medicine | Admitting: Pain Medicine

## 2013-01-11 DIAGNOSIS — M79601 Pain in right arm: Secondary | ICD-10-CM

## 2013-01-11 DIAGNOSIS — M542 Cervicalgia: Secondary | ICD-10-CM

## 2013-01-27 ENCOUNTER — Telehealth: Payer: Self-pay | Admitting: Family Medicine

## 2013-01-27 MED ORDER — VALACYCLOVIR HCL 1 G PO TABS: ORAL_TABLET | ORAL | Status: DC

## 2013-01-27 NOTE — Telephone Encounter (Addendum)
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 Pt would like to know if md would rx valACYclovir (VALTREX) 1000 MG tablet   for her. Pt had  This prescribed at another md years before [facility].

## 2013-01-27 NOTE — Telephone Encounter (Signed)
RX sent to pharmacy  

## 2013-01-27 NOTE — Telephone Encounter (Signed)
Yes.  Valtrex 1000 mg two at onset of cold sore and repeat 2 in 12 hours. Disp #30 with one refill.

## 2013-01-27 NOTE — Telephone Encounter (Signed)
Patient was seen on 12/11/12

## 2013-02-10 ENCOUNTER — Encounter (HOSPITAL_COMMUNITY): Payer: Self-pay | Admitting: Emergency Medicine

## 2013-02-10 ENCOUNTER — Emergency Department (HOSPITAL_COMMUNITY)
Admission: EM | Admit: 2013-02-10 | Discharge: 2013-02-10 | Disposition: A | Discharge status: Home / Self Care | Payer: 59 | Attending: Emergency Medicine | Admitting: Emergency Medicine

## 2013-02-10 DIAGNOSIS — M25519 Pain in unspecified shoulder: Secondary | ICD-10-CM | POA: Insufficient documentation

## 2013-02-10 DIAGNOSIS — M542 Cervicalgia: Secondary | ICD-10-CM | POA: Insufficient documentation

## 2013-02-10 DIAGNOSIS — F3289 Other specified depressive episodes: Secondary | ICD-10-CM | POA: Insufficient documentation

## 2013-02-10 DIAGNOSIS — Z87448 Personal history of other diseases of urinary system: Secondary | ICD-10-CM | POA: Insufficient documentation

## 2013-02-10 DIAGNOSIS — J45909 Unspecified asthma, uncomplicated: Secondary | ICD-10-CM | POA: Insufficient documentation

## 2013-02-10 DIAGNOSIS — G8929 Other chronic pain: Secondary | ICD-10-CM | POA: Insufficient documentation

## 2013-02-10 DIAGNOSIS — F329 Major depressive disorder, single episode, unspecified: Secondary | ICD-10-CM | POA: Insufficient documentation

## 2013-02-10 DIAGNOSIS — IMO0001 Reserved for inherently not codable concepts without codable children: Secondary | ICD-10-CM | POA: Insufficient documentation

## 2013-02-10 DIAGNOSIS — Z9109 Other allergy status, other than to drugs and biological substances: Secondary | ICD-10-CM | POA: Insufficient documentation

## 2013-02-10 DIAGNOSIS — M129 Arthropathy, unspecified: Secondary | ICD-10-CM | POA: Insufficient documentation

## 2013-02-10 DIAGNOSIS — Z888 Allergy status to other drugs, medicaments and biological substances status: Secondary | ICD-10-CM | POA: Insufficient documentation

## 2013-02-10 DIAGNOSIS — Z79899 Other long term (current) drug therapy: Secondary | ICD-10-CM | POA: Insufficient documentation

## 2013-02-10 DIAGNOSIS — N189 Chronic kidney disease, unspecified: Secondary | ICD-10-CM | POA: Insufficient documentation

## 2013-02-10 MED ORDER — HYDROMORPHONE HCL PF 1 MG/ML IJ SOLN: 1.0000 mg | Freq: Once | INTRAMUSCULAR | Status: DC

## 2013-02-10 MED ORDER — ONDANSETRON HCL 4 MG/2ML IJ SOLN
4.0000 mg | Freq: Once | INTRAMUSCULAR | Status: AC
  Administered 2013-02-10: 4 mg via INTRAVENOUS
  Filled 2013-02-10: qty 2

## 2013-02-10 MED ORDER — HYDROMORPHONE HCL PF 2 MG/ML IJ SOLN
2.0000 mg | Freq: Once | INTRAMUSCULAR | Status: AC
  Administered 2013-02-10: 2 mg via INTRAVENOUS
  Filled 2013-02-10: qty 1

## 2013-02-10 MED ORDER — PREDNISONE 10 MG PO TABS: 10.0000 mg | ORAL_TABLET | Freq: Every day | ORAL | Status: DC

## 2013-02-10 MED ORDER — PREDNISONE 20 MG PO TABS
60.0000 mg | ORAL_TABLET | Freq: Once | ORAL | Status: AC
  Administered 2013-02-10: 60 mg via ORAL
  Filled 2013-02-10: qty 3

## 2013-02-10 NOTE — ED Notes (Addendum)
Pt. reports flare up of rheumatoid arthritis / fibromyalgia ( joint / muscle aches ) onset this afternoon unrelieved by prescription Percocet.

## 2013-02-10 NOTE — ED Provider Notes (Signed)
CSN: [id number]     Arrival date & time 02/10/13  2101 History   First MD Initiated Contact with Patient 02/10/13 2154     Chief Complaint  Patient presents with  . Rheumatoid Arthritis  . Fibromyalgia   (Consider location/radiation/quality/duration/timing/severity/associated sxs/prior Treatment) HPI Pt with hx of fibromyalgia and rheumatoid arthritis presents with increase in her chronic pain.  She reports pain in bilateral shoulder and neck.  No recent injury or trauma.  She reports she is under the care of a pain management clinic and called them today- could not get an appointment until tomorrow morning.  No swelling of extremities.  No fever/chills.  She has been taking percocet 10 which usually keeps her pain at a manageable level but it has been worse over the past few days.  Pain worse with movement and palpation.  There are no other associated systemic symptoms, there are no other alleviating or modifying factors.   Past Medical History  Diagnosis Date  . Fibromyalgia   . 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 Arthritis   . Rheumatoid arthritis(714.0)     autoimmune arthritis; methotrexate once/week  . Depression     takes meds daily  . Post-nasal drip     hx of  . Other specified rheumatoid arthritis, right shoulder 08/01/2011  . Asthma   . Allergy   . Chronic kidney disease   . Urinary incontinence    Past Surgical History  Procedure Laterality Date  . Shoulder surgery  2012    right  . Back surgery  2011    lower back fusion l4-5, s1  . Bladder surgery  2009  . Tubal ligation  1988  . Breast enhancement surgery  2003  . Liposuction  2003    abdominal  . Tonsillectomy  1987  . Total shoulder arthroplasty  08/01/2011    Procedure: TOTAL SHOULDER ARTHROPLASTY;  Surgeon: Eulas Post, MD;  Location: [facility] OR;  Service: Orthopedics;  Laterality: Right;  Right total shoulder arthroplasty  . Mass excision  11/03/2011    Procedure: MINOR  EXCISION OF MASS;  Surgeon: Wyn Forster., MD;  Location: [facility];  Service: Orthopedics;  Laterality: Left;  debride IP joint, cyst excision left index   Family History  Problem Relation Age of Onset  . Anesthesia problems Neg Hx   . Hypotension Neg Hx   . Malignant hyperthermia Neg Hx   . Pseudochol deficiency Neg Hx   . Arthritis Mother   . Cancer Mother     breast  . Heart disease Mother   . COPD Father   . Hypertension Father   . Cancer Maternal Aunt 27    breast    History  Substance Use Topics  . Smoking status: Never Smoker   . Smokeless tobacco: Not on file  . Alcohol Use: No   OB History   Grav Para Term Preterm Abortions TAB SAB Ect Mult Living                 Review of Systems ROS reviewed and all otherwise negative except for mentioned in HPI  Allergies  Aspirin; Ibuprofen; Ativan; and Tramadol hcl  Home Medications   Current Outpatient Rx  Name  Route  Sig  Dispense  Refill  . amphetamine-dextroamphetamine (ADDERALL) 30 MG tablet   Oral   Take 30 mg by mouth 3 (three) times daily.         [name]  ARIPiprazole (ABILIFY) 15 MG tablet   Oral   Take 7.5 mg by mouth daily.         . cyclobenzaprine (FLEXERIL) 10 MG tablet   Oral   Take 1 tablet (10 mg total) by mouth 3 (three) times daily as needed for muscle spasms.   30 tablet   0   . estrogen, conjugated,-medroxyprogesterone (PREMPRO) 0.45-1.5 MG per tablet   Oral   Take 1 tablet by mouth daily.   30 tablet   6   . FLUoxetine (PROZAC) 20 MG capsule   Oral   Take 20 mg by mouth daily.         . Levomilnacipran HCl ER 120 MG CP24   Oral   Take 120 mg by mouth daily.         . meloxicam (MOBIC) 15 MG tablet   Oral   Take 15 mg by mouth daily.          [name] oxyCODONE-acetaminophen (PERCOCET) 10-325 MG per tablet   Oral   Take 1 tablet by mouth every 4 (four) hours as needed for pain.         [name] tretinoin (RETIN-A) 0.1 % cream   Topical   Apply 1 application  topically daily as needed (for acne).   45 g   1   . venlafaxine XR (EFFEXOR-XR) 150 MG 24 hr capsule   Oral   Take 150 mg by mouth daily with breakfast.         . zolpidem (AMBIEN) 10 MG tablet   Oral   Take 10 mg by mouth at bedtime as needed for sleep. per Joeseph Amor, PA         . predniSONE (DELTASONE) 10 MG tablet   Oral   Take 1 tablet (10 mg total) by mouth daily. Take 6 tabs po qD x 3 days, then 5 tabs po qD x 3 days, then 4 tabs po qD x 3 days, then 3 tabs po qD x 3 days, then 2 tabs po qD x 3 days, then 1 tab po qD x 3 days   63 tablet   0    BP 123/83  Pulse 105  Temp(Src) 98.2 F (36.8 C) (Oral)  Resp 16  Ht 5\' 4"  (1.626 m)  Wt 169 lb (76.658 kg)  BMI 28.99 kg/m2  SpO2 93%- HR 97 during my evaluation on monitor Vitals reviewed Physical Exam Physical Examination: General appearance - alert, well appearing, and in no distress Mental status - alert, oriented to person, place, and time Neck - ttp over right paraspinal muscles, supple, no significant adenopathy Chest - clear to auscultation, no wheezes, rales or rhonchi, symmetric air entry Heart - normal rate, regular rhythm, normal S1, S2, no murmurs, rubs, clicks or gallops Neurological - alert, oriented, normal speech, no focal findings or movement disorder noted Musculoskeletal - diffuse ttp over bilateral shoulders and pain with ROM, no deformity or swelling Extremities - peripheral pulses normal, no pedal edema, no clubbing or cyanosis Skin - normal coloration and turgor, no rashes  ED Course  Procedures (including critical care time)  11:16 PM Pt feeling much improved, she states she is back at her baseline.  Her husband is here to pick her up and she is set to follow up with her pain management doctor tomorrow morning.   Labs Review Labs Reviewed - No data to display Imaging Review Dg Chest 2 View  02/11/2013   CLINICAL DATA:  Muscle pain.  EXAM:  CHEST  2 VIEW  COMPARISON:  December 08, 2010.   FINDINGS: The heart size and mediastinal contours are within normal limits. Both lungs are clear. Status post right shoulder arthroplasty. Calcified granuloma seen in left midlung which is stable compared to prior exam.  IMPRESSION: No active cardiopulmonary disease.   Electronically Signed   By: Roque Lias M.D.   On: 02/11/2013 17:28    EKG Interpretation   None       MDM   1. Shoulder pain, unspecified laterality     Pt presenting with bilateral shoulder and neck pain, feels like a flare of her RA and/or fibromyalgia.  She has not had fevers or new injury.  She is seeing her pain management specialist tomorrow, feels back to baseline after one dose of meds in the ED.   Discharged with strict return precautions.  Pt agreeable with plan.   Ethelda Chick, MD 02/11/13 [phone]

## 2013-02-11 ENCOUNTER — Emergency Department (HOSPITAL_COMMUNITY): Payer: 59

## 2013-02-11 ENCOUNTER — Encounter (HOSPITAL_COMMUNITY): Payer: Self-pay | Admitting: Emergency Medicine

## 2013-02-11 ENCOUNTER — Emergency Department (HOSPITAL_COMMUNITY)
Admission: EM | Admit: 2013-02-11 | Discharge: 2013-02-11 | Disposition: A | Discharge status: Home / Self Care | Payer: 59 | Attending: Emergency Medicine | Admitting: Emergency Medicine

## 2013-02-11 DIAGNOSIS — N189 Chronic kidney disease, unspecified: Secondary | ICD-10-CM | POA: Insufficient documentation

## 2013-02-11 DIAGNOSIS — M069 Rheumatoid arthritis, unspecified: Secondary | ICD-10-CM

## 2013-02-11 DIAGNOSIS — Z79899 Other long term (current) drug therapy: Secondary | ICD-10-CM | POA: Insufficient documentation

## 2013-02-11 DIAGNOSIS — J45909 Unspecified asthma, uncomplicated: Secondary | ICD-10-CM | POA: Insufficient documentation

## 2013-02-11 DIAGNOSIS — Z791 Long term (current) use of non-steroidal anti-inflammatories (NSAID): Secondary | ICD-10-CM | POA: Insufficient documentation

## 2013-02-11 DIAGNOSIS — F329 Major depressive disorder, single episode, unspecified: Secondary | ICD-10-CM | POA: Insufficient documentation

## 2013-02-11 DIAGNOSIS — R52 Pain, unspecified: Secondary | ICD-10-CM | POA: Insufficient documentation

## 2013-02-11 DIAGNOSIS — M79609 Pain in unspecified limb: Secondary | ICD-10-CM | POA: Insufficient documentation

## 2013-02-11 DIAGNOSIS — IMO0002 Reserved for concepts with insufficient information to code with codable children: Secondary | ICD-10-CM | POA: Insufficient documentation

## 2013-02-11 DIAGNOSIS — F3289 Other specified depressive episodes: Secondary | ICD-10-CM | POA: Insufficient documentation

## 2013-02-11 LAB — COMPREHENSIVE METABOLIC PANEL
ALT: 16 U/L (ref 0–35)
AST: 18 U/L (ref 0–37)
Albumin: 3.9 g/dL (ref 3.5–5.2)
Alkaline Phosphatase: 101 U/L (ref 39–117)
Potassium: 3.9 mEq/L (ref 3.5–5.1)
Sodium: 139 mEq/L (ref 135–145)
Total Protein: 7.3 g/dL (ref 6.0–8.3)

## 2013-02-11 LAB — URINALYSIS, ROUTINE W REFLEX MICROSCOPIC
Bilirubin Urine: NEGATIVE
Glucose, UA: 100 mg/dL — AB
Ketones, ur: NEGATIVE mg/dL
pH: 5.5 (ref 5.0–8.0)

## 2013-02-11 LAB — CBC WITH DIFFERENTIAL/PLATELET
Basophils Absolute: 0 10*3/uL (ref 0.0–0.1)
Basophils Relative: 0 % (ref 0–1)
Eosinophils Absolute: 0 10*3/uL (ref 0.0–0.7)
Lymphs Abs: 2.4 10*3/uL (ref 0.7–4.0)
MCH: 27.6 pg (ref 26.0–34.0)
MCHC: 32.5 g/dL (ref 30.0–36.0)
Neutrophils Relative %: 74 % (ref 43–77)
Platelets: 381 10*3/uL (ref 150–400)
RBC: 4.49 MIL/uL (ref 3.87–5.11)

## 2013-02-11 LAB — CK: Total CK: 115 U/L (ref 7–177)

## 2013-02-11 MED ORDER — SODIUM CHLORIDE 0.9 % IV BOLUS (SEPSIS)
1000.0000 mL | Freq: Once | INTRAVENOUS | Status: AC
  Administered 2013-02-11: 1000 mL via INTRAVENOUS

## 2013-02-11 MED ORDER — HYDROMORPHONE HCL PF 1 MG/ML IJ SOLN
1.0000 mg | Freq: Once | INTRAMUSCULAR | Status: AC
  Administered 2013-02-11: 1 mg via INTRAVENOUS
  Filled 2013-02-11: qty 1

## 2013-02-11 NOTE — ED Provider Notes (Signed)
I saw and evaluated the patient, reviewed the resident's note and I agree with the findings and plan. If applicable, I agree with the resident's interpretation of the EKG.  If applicable, I was present for critical portions of any procedures performed.  Hx RA, fibromyalgia with "flare" for several days.  Seen yesterday for same. No fever. No hot, red joints.   5/5 strength in bilateral lower extremities. Ankle plantar and dorsiflexion intact. Great toe extension intact bilaterally. +2 DP and PT pulses. +2 patellar reflexes bilaterally. Normal gait.   Glynn Octave, MD 02/11/13 [phone]

## 2013-02-11 NOTE — ED Notes (Signed)
Patient transported to X-ray 

## 2013-02-11 NOTE — ED Notes (Signed)
Has Fibromyalgia and RA and she states that she has flares and she is having pain all over. Was seen last night for same

## 2013-02-11 NOTE — ED Notes (Signed)
Patient is alert and orientedx4.  Patient was explained discharge instructions and they understood them with no questions.  The patient's husband, Kara [name] is taking the patient home.

## 2013-02-11 NOTE — ED Provider Notes (Signed)
CSN: [id number]     Arrival date & time 02/11/13  1304 History   First MD Initiated Contact with Patient 02/11/13 1520     Chief Complaint  Patient presents with  . Muscle Pain   (Consider location/radiation/quality/duration/timing/severity/associated sxs/prior Treatment) The history is provided by the patient. No language interpreter was used.   Patient is a 57 year old Caucasian female with past medical history fibromyalgia and rheumatoid arthritis. She's been having a flare for couple days. She can emergency department yesterday for similar complaint. At that time she was experiencing generalized pain with pain worse in her back. She was treated with prednisone and pain medication and was discharged home. She did not have any labs drawn at that time. Comes emergency department today with continued pain. She states it is better than yesterday but she was unable to tolerate the pain at home. She was unable to contact her rheumatology or pain clinic today for assistance. Not had any fevers or chills. She does not have pain worse in any particular joint. It is worse generally in her back.  She rates the pain at an 8/10  Past Medical History  Diagnosis Date  . Fibromyalgia   . 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 Arthritis   . Rheumatoid arthritis(714.0)     autoimmune arthritis; methotrexate once/week  . Depression     takes meds daily  . Post-nasal drip     hx of  . Other specified rheumatoid arthritis, right shoulder 08/01/2011  . Asthma   . Allergy   . Chronic kidney disease   . Urinary incontinence    Past Surgical History  Procedure Laterality Date  . Shoulder surgery  2012    right  . Back surgery  2011    lower back fusion l4-5, s1  . Bladder surgery  2009  . Tubal ligation  1988  . Breast enhancement surgery  2003  . Liposuction  2003    abdominal  . Tonsillectomy  1987  . Total shoulder arthroplasty  08/01/2011    Procedure: TOTAL  SHOULDER ARTHROPLASTY;  Surgeon: Eulas Post, MD;  Location: [facility];  Service: Orthopedics;  Laterality: Right;  Right total shoulder arthroplasty  . Mass excision  11/03/2011    Procedure: MINOR EXCISION OF MASS;  Surgeon: Wyn Forster., MD;  Location: [facility];  Service: Orthopedics;  Laterality: Left;  debride IP joint, cyst excision left index   Family History  Problem Relation Age of Onset  . Anesthesia problems Neg Hx   . Hypotension Neg Hx   . Malignant hyperthermia Neg Hx   . Pseudochol deficiency Neg Hx   . Arthritis Mother   . Cancer Mother     breast  . Heart disease Mother   . COPD Father   . Hypertension Father   . Cancer Maternal Aunt 27    breast    History  Substance Use Topics  . Smoking status: Never Smoker   . Smokeless tobacco: Not on file  . Alcohol Use: No   OB History   Grav Para Term Preterm Abortions TAB SAB Ect Mult Living                 Review of Systems  Constitutional: Negative for fever and chills.  Respiratory: Negative for cough and shortness of breath.   Gastrointestinal: Negative for nausea, vomiting, abdominal pain, diarrhea, constipation and anal bleeding.  Genitourinary: Negative for dysuria,  urgency and frequency.  Musculoskeletal: Positive for back pain and myalgias. Negative for arthralgias, joint swelling, neck pain and neck stiffness.  Skin: Negative for color change.  Neurological: Negative for dizziness, weakness and numbness.  Psychiatric/Behavioral: Negative for confusion.  All other systems reviewed and are negative.    Allergies  Aspirin; Ibuprofen; Ativan; and Tramadol hcl  Home Medications   Current Outpatient Rx  Name  Route  Sig  Dispense  Refill  . amphetamine-dextroamphetamine (ADDERALL) 30 MG tablet   Oral   Take 30 mg by mouth 3 (three) times daily.         . ARIPiprazole (ABILIFY) 15 MG tablet   Oral   Take 7.5 mg by mouth daily.         . cyclobenzaprine (FLEXERIL) 10 MG  tablet   Oral   Take 1 tablet (10 mg total) by mouth 3 (three) times daily as needed for muscle spasms.   30 tablet   0   . estrogen, conjugated,-medroxyprogesterone (PREMPRO) 0.45-1.5 MG per tablet   Oral   Take 1 tablet by mouth daily.   30 tablet   6   . FLUoxetine (PROZAC) 20 MG capsule   Oral   Take 20 mg by mouth daily.         . Levomilnacipran HCl ER 120 MG CP24   Oral   Take 120 mg by mouth daily.         . meloxicam (MOBIC) 15 MG tablet   Oral   Take 15 mg by mouth daily.          [name] oxyCODONE-acetaminophen (PERCOCET) 10-325 MG per tablet   Oral   Take 1 tablet by mouth every 4 (four) hours as needed for pain.         . predniSONE (DELTASONE) 10 MG tablet   Oral   Take 1 tablet (10 mg total) by mouth daily. Take 6 tabs po qD x 3 days, then 5 tabs po qD x 3 days, then 4 tabs po qD x 3 days, then 3 tabs po qD x 3 days, then 2 tabs po qD x 3 days, then 1 tab po qD x 3 days   63 tablet   0   . tretinoin (RETIN-A) 0.1 % cream   Topical   Apply 1 application topically daily as needed (for acne).   45 g   1   . venlafaxine XR (EFFEXOR-XR) 150 MG 24 hr capsule   Oral   Take 150 mg by mouth daily with breakfast.         . zolpidem (AMBIEN) 10 MG tablet   Oral   Take 10 mg by mouth at bedtime as needed for sleep. per Joeseph Amor, PA          BP 156/88  Pulse 99  Temp(Src) 98.9 F (37.2 C) (Oral)  Resp 18  Ht 5\' 4"  (1.626 m)  Wt 162 lb (73.483 kg)  BMI 27.79 kg/m2  SpO2 99% Physical Exam  Nursing note and vitals reviewed. Constitutional: She is oriented to person, place, and time. She appears well-developed and well-nourished. No distress.  HENT:  Head: Normocephalic and atraumatic.  Eyes: Pupils are equal, round, and reactive to light.  Neck: Normal range of motion.  Cardiovascular: Normal rate, regular rhythm, normal heart sounds and intact distal pulses.   Pulmonary/Chest: Effort normal. No respiratory distress. She has no wheezes. She  exhibits no tenderness.  Abdominal: Soft. Bowel sounds are normal. She exhibits no distension.  There is no tenderness. There is no rebound and no guarding.  Musculoskeletal:  Good range of motion joints of upper and lower extremities.  No warmth to joints in upper and lower extremities.    Neurological: She is alert and oriented to person, place, and time. She has normal strength. No cranial nerve deficit or sensory deficit. She exhibits normal muscle tone. Coordination and gait normal.  Skin: Skin is warm and dry.    ED Course  Procedures (including critical care time) Labs Review Labs Reviewed  CBC WITH DIFFERENTIAL - Abnormal; Notable for the following:    WBC 13.6 (*)    Neutro Abs 10.2 (*)    Monocytes Absolute 1.1 (*)    All other components within normal limits  COMPREHENSIVE METABOLIC PANEL - Abnormal; Notable for the following:    Glucose, Bld 148 (*)    Total Bilirubin 0.1 (*)    All other components within normal limits  URINALYSIS, ROUTINE W REFLEX MICROSCOPIC - Abnormal; Notable for the following:    APPearance HAZY (*)    Glucose, UA 100 (*)    All other components within normal limits  CK   Imaging Review Dg Chest 2 View  02/11/2013   CLINICAL DATA:  Muscle pain.  EXAM: CHEST  2 VIEW  COMPARISON:  December 08, 2010.  FINDINGS: The heart size and mediastinal contours are within normal limits. Both lungs are clear. Status post right shoulder arthroplasty. Calcified granuloma seen in left midlung which is stable compared to prior exam.  IMPRESSION: No active cardiopulmonary disease.   Electronically Signed   By: Roque Lias M.D.   On: 02/11/2013 17:28    EKG Interpretation   None       MDM  Patient's 57 year old Caucasian female with past medical history of rheumatoid arthritis comes emergency department today with concerns for rheumatoid flare. Physical exam as above. Initial workup included a UA, CBC, CMP, CK, and chest x-ray. UA was unremarkable. CBC had a WBC  of 13.6. Patient is taking prednisone as results was felt to be from prednisone use. CMP was unremarkable. CK was 115. X-ray demonstrated no cardiopulmonary disease.  With pain and generalized locations, no fevers, good range of motion of joints, and no warmth to joints doubt septic arthritis. With normal CK doubt muscle breakdown. This is likely rheumatoid arthritis flare. Patient started on prednisone after visit yesterday. She was waiting in the emergency department was able to contact rheumatologist and her pain clinic. She has appointments set up for a week from today. She is also informed that if she has continues to have pain tomorrow to walk into their clinics and they will see her. Patient's pain was controlled with 2 mg of Dilaudid in the emergency department. She was felt to be stable for discharge. She was instructed to return to the emergency department for worsening pain, fevers, or new concerns. She expressed understanding. Labs and imaging reviewed by myself and considered in medical decision-making. Imaging was interpreted radiology. Care was discussed my attending Dr. Manus Gunning.   1. Rheumatoid arthritis   2. Generalized pain        Bethann Berkshire, MD 02/11/13 [phone]

## 2013-03-10 ENCOUNTER — Ambulatory Visit: Payer: 59 | Admitting: Family Medicine

## 2013-03-10 DIAGNOSIS — Z0289 Encounter for other administrative examinations: Secondary | ICD-10-CM

## 2013-04-07 ENCOUNTER — Emergency Department (HOSPITAL_COMMUNITY)
Admission: EM | Admit: 2013-04-07 | Discharge: 2013-04-07 | Disposition: A | Discharge status: Home / Self Care | Payer: Managed Care, Other (non HMO) | Attending: Emergency Medicine | Admitting: Emergency Medicine

## 2013-04-07 ENCOUNTER — Encounter (HOSPITAL_COMMUNITY): Payer: Self-pay | Admitting: Emergency Medicine

## 2013-04-07 DIAGNOSIS — M129 Arthropathy, unspecified: Secondary | ICD-10-CM | POA: Insufficient documentation

## 2013-04-07 DIAGNOSIS — Z9109 Other allergy status, other than to drugs and biological substances: Secondary | ICD-10-CM | POA: Insufficient documentation

## 2013-04-07 DIAGNOSIS — Z888 Allergy status to other drugs, medicaments and biological substances status: Secondary | ICD-10-CM | POA: Insufficient documentation

## 2013-04-07 DIAGNOSIS — J45909 Unspecified asthma, uncomplicated: Secondary | ICD-10-CM | POA: Insufficient documentation

## 2013-04-07 DIAGNOSIS — M542 Cervicalgia: Secondary | ICD-10-CM | POA: Insufficient documentation

## 2013-04-07 DIAGNOSIS — Z79899 Other long term (current) drug therapy: Secondary | ICD-10-CM | POA: Insufficient documentation

## 2013-04-07 DIAGNOSIS — M5430 Sciatica, unspecified side: Secondary | ICD-10-CM | POA: Insufficient documentation

## 2013-04-07 DIAGNOSIS — M069 Rheumatoid arthritis, unspecified: Secondary | ICD-10-CM | POA: Insufficient documentation

## 2013-04-07 DIAGNOSIS — IMO0002 Reserved for concepts with insufficient information to code with codable children: Secondary | ICD-10-CM | POA: Insufficient documentation

## 2013-04-07 DIAGNOSIS — F3289 Other specified depressive episodes: Secondary | ICD-10-CM | POA: Insufficient documentation

## 2013-04-07 DIAGNOSIS — M545 Low back pain, unspecified: Secondary | ICD-10-CM | POA: Insufficient documentation

## 2013-04-07 DIAGNOSIS — F329 Major depressive disorder, single episode, unspecified: Secondary | ICD-10-CM | POA: Insufficient documentation

## 2013-04-07 DIAGNOSIS — IMO0001 Reserved for inherently not codable concepts without codable children: Secondary | ICD-10-CM | POA: Insufficient documentation

## 2013-04-07 DIAGNOSIS — N189 Chronic kidney disease, unspecified: Secondary | ICD-10-CM | POA: Insufficient documentation

## 2013-04-07 DIAGNOSIS — Z87448 Personal history of other diseases of urinary system: Secondary | ICD-10-CM | POA: Insufficient documentation

## 2013-04-07 MED ORDER — PREDNISONE 20 MG PO TABS: 40.0000 mg | ORAL_TABLET | Freq: Every day | ORAL | Status: DC

## 2013-04-07 MED ORDER — OXYCODONE-ACETAMINOPHEN 5-325 MG PO TABS: 1.0000 | ORAL_TABLET | ORAL | Status: DC | PRN

## 2013-04-07 MED ORDER — HYDROMORPHONE HCL PF 1 MG/ML IJ SOLN
1.0000 mg | Freq: Once | INTRAMUSCULAR | Status: AC
  Administered 2013-04-07: 1 mg via INTRAMUSCULAR
  Filled 2013-04-07: qty 1

## 2013-04-07 NOTE — ED Notes (Addendum)
Patient tearful and stating that Percocet does not help her pain and she does not take it anymore. PA made aware.

## 2013-04-07 NOTE — ED Notes (Signed)
PA at bedside to speak with patient.  

## 2013-04-07 NOTE — ED Notes (Addendum)
Pt comfortable with d/c and f/u instructions. Prescriptions x2. EDPA made aware of high BP, states OK for discharge

## 2013-04-07 NOTE — ED Provider Notes (Signed)
CSN: [id number]     Arrival date & time 04/07/13  1700 History  This chart was scribed for non-physician practitioner Mellody Drown, PA-C, working with Raeford Razor, MD by Nicholos Johns, ED scribe. This patient was seen in room TR07C/TR07C and the patient's care was started at 7:20 PM.    Chief Complaint  Patient presents with  . Back Pain  . Neck Pain   The history is provided by the patient. No language interpreter was used.  HPI Comments: Kara [name] is a 58 y.o. female w/hx of fibroid myalgia and rheumatoid arthritis presents to the Emergency Department complaining of gradually worsening back pain, onset 5 days ago. Pt reports this pain is going down her right leg this time which is not typical to usual pain; preference to sit in [place] style position in examination room and states this provides some relief. Pt attempted to make an appointment with Dr. Jordan Likes at [facility] but is unable to attain one until January 20. Pt took Prednisone for 3 days and hydrocodone with no relief. States Percocet has provided some relief in the past. Denis fever, rash, bowel/bladder incontinence, trouble urinating.    Past Medical History  Diagnosis Date  . Fibromyalgia   . 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 Arthritis   . Rheumatoid arthritis(714.0)     autoimmune arthritis; methotrexate once/week  . Depression     takes meds daily  . Post-nasal drip     hx of  . Other specified rheumatoid arthritis, right shoulder 08/01/2011  . Asthma   . Allergy   . Chronic kidney disease   . Urinary incontinence    Past Surgical History  Procedure Laterality Date  . Shoulder surgery  2012    right  . Back surgery  2011    lower back fusion l4-5, s1  . Bladder surgery  2009  . Tubal ligation  1988  . Breast enhancement surgery  2003  . Liposuction  2003    abdominal  . Tonsillectomy  1987  . Total shoulder arthroplasty  08/01/2011    Procedure: TOTAL  SHOULDER ARTHROPLASTY;  Surgeon: Eulas Post, MD;  Location: [facility] OR;  Service: Orthopedics;  Laterality: Right;  Right total shoulder arthroplasty  . Mass excision  11/03/2011    Procedure: MINOR EXCISION OF MASS;  Surgeon: Wyn Forster., MD;  Location: [facility];  Service: Orthopedics;  Laterality: Left;  debride IP joint, cyst excision left index   Family History  Problem Relation Age of Onset  . Anesthesia problems Neg Hx   . Hypotension Neg Hx   . Malignant hyperthermia Neg Hx   . Pseudochol deficiency Neg Hx   . Arthritis Mother   . Cancer Mother     breast  . Heart disease Mother   . COPD Father   . Hypertension Father   . Cancer Maternal Aunt 27    breast    History  Substance Use Topics  . Smoking status: Never Smoker   . Smokeless tobacco: Not on file  . Alcohol Use: No   OB History   Grav Para Term Preterm Abortions TAB SAB Ect Mult Living                 Review of Systems  Constitutional: Negative for fever and chills.  Genitourinary: Negative for enuresis and difficulty urinating.  Musculoskeletal: Positive for back pain and neck pain. Negative for neck  stiffness.  Skin: Negative for rash.  All other systems reviewed and are negative.    Allergies  Aspirin; Ibuprofen; Ativan; and Tramadol hcl  Home Medications   Current Outpatient Rx  Name  Route  Sig  Dispense  Refill  . amphetamine-dextroamphetamine (ADDERALL) 30 MG tablet   Oral   Take 30 mg by mouth 3 (three) times daily.         . ARIPiprazole (ABILIFY) 15 MG tablet   Oral   Take 7.5 mg by mouth daily.         . cyclobenzaprine (FLEXERIL) 10 MG tablet   Oral   Take 1 tablet (10 mg total) by mouth 3 (three) times daily as needed for muscle spasms.   30 tablet   0   . estrogen, conjugated,-medroxyprogesterone (PREMPRO) 0.45-1.5 MG per tablet   Oral   Take 1 tablet by mouth daily.   30 tablet   6   . FLUoxetine (PROZAC) 20 MG capsule   Oral   Take 20 mg by  mouth daily.         [name] HYDROcodone-acetaminophen (NORCO) 10-325 MG per tablet   Oral   Take 1-2 tablets by mouth every 6 (six) hours as needed.         . Levomilnacipran HCl ER 120 MG CP24   Oral   Take 120 mg by mouth daily.         . meloxicam (MOBIC) 15 MG tablet   Oral   Take 15 mg by mouth daily.          [name] tretinoin (RETIN-A) 0.1 % cream   Topical   Apply 1 application topically daily as needed (for acne).   45 g   1   . venlafaxine XR (EFFEXOR-XR) 150 MG 24 hr capsule   Oral   Take 150 mg by mouth daily with breakfast.         . zolpidem (AMBIEN) 10 MG tablet   Oral   Take 10 mg by mouth at bedtime as needed for sleep. per Joeseph Amor, PA         . oxyCODONE-acetaminophen (PERCOCET) 10-325 MG per tablet   Oral   Take 1 tablet by mouth every 4 (four) hours as needed for pain.         . predniSONE (DELTASONE) 10 MG tablet   Oral   Take 1 tablet (10 mg total) by mouth daily. Take 6 tabs po qD x 3 days, then 5 tabs po qD x 3 days, then 4 tabs po qD x 3 days, then 3 tabs po qD x 3 days, then 2 tabs po qD x 3 days, then 1 tab po qD x 3 days   63 tablet   0    Triage Vitals: BP 169/97  Pulse 108  Temp(Src) 98.3 F (36.8 C) (Oral)  Resp 20  SpO2 97% Physical Exam  Nursing note and vitals reviewed. Constitutional: She is oriented to person, place, and time. She appears well-developed and well-nourished. No distress.  Patient is tearful during exam.  HENT:  Head: Normocephalic and atraumatic.  Eyes: EOM are normal. No scleral icterus.  Neck: Neck supple. No tracheal deviation present.  Cardiovascular: Normal rate.   Pulmonary/Chest: Effort normal. No respiratory distress. She has no wheezes. She has no rales.  Abdominal: Soft. There is no tenderness.  Musculoskeletal: Normal range of motion.  No midline C-spine, T-spine, or L-spine tenderness with no step-offs, crepitus, or deformities noted.  Tenderness to soft  tissue of neck and lumbar spine.  Reports pain with active ROM, full ROM.    Neurological: She is alert and oriented to person, place, and time.  Skin: Skin is warm and dry. No rash noted.  Psychiatric: She has a normal mood and affect. Her behavior is normal.    ED Course  Procedures DIAGNOSTIC STUDIES: Oxygen Saturation is 97% on room air, normal by my interpretation.    COORDINATION OF CARE: At 7:20 PM: Discussed treatment plan with patient which includes pain medication. Patient agrees.   Labs Review Labs Reviewed - No data to display Imaging Review No results found.  EKG Interpretation   None       MDM   1. Low back pain with sciatica, unspecified laterality    Pt reports RA and fibromyalgia "flare".  No red flags during history or exam, no midline tenderness, and I don't feel like imaging is warranted at this time.  Imaging performed 02/11/2013 of chest and EMR shows multiple visits for similar complaints.  Will give dilaudid in the ED and request to follow up with her pain doctor or find one that will be able to better manage her pain.  Discussed treatment plan with the patient. Return precautions given. Reports understanding and no other concerns at this time.  Patient is stable for discharge at this time.  Meds given in ED:  Medications  HYDROmorphone (DILAUDID) injection 1 mg (1 mg Intramuscular Given 04/07/13 2011)    Discharge Medication List as of 04/07/2013  7:31 PM    START taking these medications   Details  oxyCODONE-acetaminophen (PERCOCET/ROXICET) 5-325 MG per tablet Take 1 tablet by mouth every 4 (four) hours as needed for severe pain. With food, Starting 04/07/2013, Until Discontinued, Print    predniSONE (DELTASONE) 20 MG tablet Take 2 tablets (40 mg total) by mouth daily. Take 40 mg by mouth daily for 3 days, then 20mg  by mouth daily for 3 days, then 10mg  daily for 3 days Take with food, Starting 04/07/2013, Until Discontinued, Print        I personally performed the services  described in this documentation, which was scribed in my presence. The recorded information has been reviewed and is accurate.       , PA-C 04/12/13 2123

## 2013-04-07 NOTE — ED Notes (Signed)
Pt is here with back pain that started one week ago and then neck pain that started on Saturday.

## 2013-04-07 NOTE — Discharge Instructions (Signed)
Call for a follow up appointment with a Family or Primary Care Provider.  °Return if Symptoms worsen.   °Take medication as prescribed.  ° °

## 2013-04-14 NOTE — ED Provider Notes (Signed)
Medical screening examination/treatment/procedure(s) were performed by non-physician practitioner and as supervising physician I was immediately available for consultation/collaboration.  EKG Interpretation   None        Raeford Razor, MD 04/14/13 2228

## 2013-07-01 ENCOUNTER — Telehealth: Payer: Self-pay | Admitting: Family Medicine

## 2013-07-01 NOTE — Telephone Encounter (Signed)
Patient Information:  Caller Name: Keana  Phone: [phone]  Patient: Kara [name]  Gender: Female  DOB: 05/13/1955  Age: 58 Years  PCP: Evelena Peat (Family Practice)  Office Follow Up:  Does the office need to follow up with this patient?: Yes  Instructions For The Office: Pt. is asking if there is anything you can recommend to give her some energy. Not able to get out of the bed due to extreme fatigue. Pt. to start Humira injections today.  RN Note:  Pt. is in bed. Having extreme fatigue. Wanting to know if anything will help her. Starts on the Humira injection today. Pt. will get the Adderall filled on 07/03/13 and asking if she should have the dose increased by her Psychiatrist. Advised not at this time since starting a new medication.  Symptoms  Reason For Call & Symptoms: Pt. has hx of ADD, Fibomyalgia, RA and Chronic Fatigue. Out of ADD medication, but not the entire problem. Starts on Humira today. Still c/o extrieme fatigue. Wants to know if there is anything that will give her energy. Takes Percocet 10/325mg . Started on Fentanyl Pain patch on 06/30/13.  Reviewed Health History In EMR: Yes  Reviewed Medications In EMR: Yes  Reviewed Allergies In EMR: Yes  Reviewed Surgeries / Procedures: Yes  Date of Onset of Symptoms: 07/01/2013  Guideline(s) Used:  No Protocol Available - Information Only  Disposition Per Guideline:   Discuss with PCP and Callback by Nurse Today  Reason For Disposition Reached:   Nursing judgment  Advice Given:  Call Back If:  New symptoms develop  You become worse.  Patient Will Follow Care Advice:  YES

## 2013-07-01 NOTE — Telephone Encounter (Signed)
Spoke with patient to inform patient that Dr. Caryl Never is out of the office today. Pt stated that she had spoke with her rheumatoid arthritis doctor and stated that with the Humira injections fatigue is one of the symptoms.

## 2013-07-03 ENCOUNTER — Other Ambulatory Visit: Payer: Managed Care, Other (non HMO)

## 2013-07-03 ENCOUNTER — Ambulatory Visit (HOSPITAL_BASED_OUTPATIENT_CLINIC_OR_DEPARTMENT_OTHER): Payer: Managed Care, Other (non HMO) | Admitting: Genetic Counselor

## 2013-07-03 DIAGNOSIS — Z803 Family history of malignant neoplasm of breast: Secondary | ICD-10-CM

## 2013-07-03 DIAGNOSIS — Z809 Family history of malignant neoplasm, unspecified: Secondary | ICD-10-CM

## 2013-07-03 DIAGNOSIS — Z800 Family history of malignant neoplasm of digestive organs: Secondary | ICD-10-CM

## 2013-07-03 NOTE — Progress Notes (Signed)
Patient Name: Kara [name] Patient Age: 58 y.o. Encounter Date: 07/03/2013  Referring Physician: Eulas Post, MD [place], [place] [id number]  Primary Care Provider: Eulas Post, MD   Kara [name], a 58 y.o. female, is being seen at the [facility] due to a family history of breast and other cancers. She was seen in 2012 for genetic counseling and testing. She presents to clinic today to discuss additional genetic testing that may be available.  HISTORY OF PRESENT ILLNESS: Kara [name] has no personal history of cancer.  She states she has a yearly mammogram, clinical breast exam and gynecologic exam. Her last colonoscopy (~2012) was negative and she was told to return in 10 years.  She was seen on 12/12/10 for genetic counseling and pursued BRCA1/BRCA2 testing (with [facility]). That test was negative for pathogenic mutations.  Past Medical History  Diagnosis Date  . Fibromyalgia   . 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 Arthritis   . Rheumatoid arthritis(714.0)     autoimmune arthritis; methotrexate once/week  . Depression     takes meds daily  . Post-nasal drip     hx of  . Other specified rheumatoid arthritis, right shoulder 08/01/2011  . Asthma   . Allergy   . Chronic kidney disease   . Urinary incontinence   . Family history of malignant neoplasm of breast     Past Surgical History  Procedure Laterality Date  . Shoulder surgery  2012    right  . Back surgery  2011    lower back fusion l4-5, s1  . Bladder surgery  2009  . Tubal ligation  1988  . Breast enhancement surgery  2003  . Liposuction  2003    abdominal  . Tonsillectomy  1987  . Total shoulder arthroplasty  08/01/2011    Procedure: TOTAL SHOULDER ARTHROPLASTY;  Surgeon: Johnny Bridge, MD;  Location: [facility];  Service: Orthopedics;  Laterality: Right;  Right total shoulder arthroplasty  . Mass excision  11/03/2011    Procedure: MINOR EXCISION OF  MASS;  Surgeon: Cammie Sickle., MD;  Location: [facility];  Service: Orthopedics;  Laterality: Left;  debride IP joint, cyst excision left index    History   Social History  . Marital Status: Married    Spouse Name: N/A    Number of Children: N/A  . Years of Education: N/A   Social History Main Topics  . Smoking status: Never Smoker   . Smokeless tobacco: Not on file  . Alcohol Use: No  . Drug Use: Yes    Special: Marijuana     Comment: rarely  . Sexual Activity: Yes    Birth Control/ Protection: Post-menopausal   Other Topics Concern  . Not on file   Social History Narrative  . No narrative on file     FAMILY HISTORY:   During the visit, a 4-generation pedigree was obtained. Significant diagnoses include the following:  Family History  Problem Relation Age of Onset  . Arthritis Mother   . Heart disease Mother   . Breast cancer Mother 20    TAH/BSO  . COPD Father   . Hypertension Father   . Breast cancer Maternal Aunt 27    deceased  . Cancer Cousin 47    female; unknown primary  . Colon cancer Paternal Aunt 55    deceased at 74  . Stomach cancer Paternal Uncle  24    deceased at 40   She has two maternal aunts and two maternal uncles who do not have cancer.  Kara [name] ancestry is [place], [place] and [place]. There is no known Jewish ancestry and no consanguinity.  ASSESSMENT AND PLAN: Kara [name] is a 58 y.o. female with a family history of breast cancer in her mother at 17 and a maternal aunt at 13. Even though she has an aunt with early age-onset breast cancer, this history is not highly suggestive of a hereditary predisposition to cancer given all the other unaffected relatives. She has already had negative BRCA1 and BRCA2 testing.   Kara [name] was highly motivated to obtain additional genetic testing. We reviewed the characteristics, features and inheritance patterns of hereditary cancer syndromes. We discussed the process of testing, insurance  coverage and implications of results. She understood the VUS rate increase the more genes are analyzed, and was comfortable with potentially received a VUS result.  Her blood sample will be sent to [facility] for analysis of 17 genes on the BreastNext panel. We discussed the implications of a positive, negative and/ or Variant of Uncertain Significance (VUS) result. Results should be available in approximately 4-5 weeks, at which point we will contact her and address implications for her as well as address genetic testing for at-risk family members, if needed.    We encouraged Kara [name] to remain in contact with [facility] annually so that we can update the family history and inform her of any changes in cancer genetics and testing that may be of benefit for this family. Ms.  [name] questions were answered to her satisfaction today.   Thank you for the referral and allowing [place] to share in the care of your patient.   The patient was seen for a total of 30 minutes, greater than 50% of which was spent face-to-face counseling. This patient was discussed with the referring provider who agrees with the above.

## 2013-07-04 ENCOUNTER — Telehealth: Payer: Self-pay | Admitting: Family Medicine

## 2013-07-04 ENCOUNTER — Other Ambulatory Visit: Payer: Self-pay | Admitting: Family Medicine

## 2013-07-04 NOTE — Telephone Encounter (Signed)
Pt has mass in left breast, (close to under arm). Pt would like a mri to adrress this issue. Pt was reccommended to have this in 2012, and she forgot. Pt has made cpe appt 4/22 Pt would like this done at [facility] imaging

## 2013-07-04 NOTE — Telephone Encounter (Signed)
Pt informed that she would have to wait to be seen to get a MRI done. Pt understands.

## 2013-07-10 ENCOUNTER — Other Ambulatory Visit: Payer: Managed Care, Other (non HMO)

## 2013-07-11 ENCOUNTER — Other Ambulatory Visit: Payer: Managed Care, Other (non HMO)

## 2013-07-16 ENCOUNTER — Other Ambulatory Visit: Payer: Managed Care, Other (non HMO)

## 2013-07-17 ENCOUNTER — Encounter: Payer: Managed Care, Other (non HMO) | Admitting: Family Medicine

## 2013-07-21 ENCOUNTER — Emergency Department (HOSPITAL_COMMUNITY)
Admission: EM | Admit: 2013-07-21 | Discharge: 2013-07-21 | Disposition: A | Discharge status: Home / Self Care | Payer: Managed Care, Other (non HMO) | Attending: Emergency Medicine | Admitting: Emergency Medicine

## 2013-07-21 ENCOUNTER — Encounter (HOSPITAL_COMMUNITY): Payer: Self-pay | Admitting: Emergency Medicine

## 2013-07-21 DIAGNOSIS — Z79899 Other long term (current) drug therapy: Secondary | ICD-10-CM | POA: Insufficient documentation

## 2013-07-21 DIAGNOSIS — M069 Rheumatoid arthritis, unspecified: Secondary | ICD-10-CM | POA: Insufficient documentation

## 2013-07-21 DIAGNOSIS — IMO0002 Reserved for concepts with insufficient information to code with codable children: Secondary | ICD-10-CM | POA: Insufficient documentation

## 2013-07-21 DIAGNOSIS — F329 Major depressive disorder, single episode, unspecified: Secondary | ICD-10-CM | POA: Insufficient documentation

## 2013-07-21 DIAGNOSIS — Z791 Long term (current) use of non-steroidal anti-inflammatories (NSAID): Secondary | ICD-10-CM | POA: Insufficient documentation

## 2013-07-21 DIAGNOSIS — F3289 Other specified depressive episodes: Secondary | ICD-10-CM | POA: Insufficient documentation

## 2013-07-21 DIAGNOSIS — N189 Chronic kidney disease, unspecified: Secondary | ICD-10-CM | POA: Insufficient documentation

## 2013-07-21 DIAGNOSIS — J45909 Unspecified asthma, uncomplicated: Secondary | ICD-10-CM | POA: Insufficient documentation

## 2013-07-21 MED ORDER — OXYCODONE-ACETAMINOPHEN 5-325 MG PO TABS: 1.0000 | ORAL_TABLET | Freq: Once | ORAL | Status: DC

## 2013-07-21 MED ORDER — HYDROMORPHONE HCL PF 1 MG/ML IJ SOLN
2.0000 mg | Freq: Once | INTRAMUSCULAR | Status: AC
  Administered 2013-07-21: 2 mg via INTRAMUSCULAR
  Filled 2013-07-21: qty 2

## 2013-07-21 MED ORDER — DEXAMETHASONE SODIUM PHOSPHATE 10 MG/ML IJ SOLN
10.0000 mg | Freq: Once | INTRAMUSCULAR | Status: AC
  Administered 2013-07-21: 10 mg via INTRAMUSCULAR
  Filled 2013-07-21: qty 1

## 2013-07-21 NOTE — ED Provider Notes (Signed)
CSN: [id number]     Arrival date & time 07/21/13  1642 History  This chart was scribed for non-physician practitioner, Marlon Pel, PA-C,working with Ethelda Chick, MD, by Karle Plumber, ED Scribe.  This patient was seen in room TR09C/TR09C and the patient's care was started at 6:04 PM.  Chief Complaint  Patient presents with  . Arthritis   HPI HPI Comments:  Kara [name] is a 58 y.o. female with h/o fibromyalgia, rheumatoid arthritis and spinal fusion who presents to the Emergency Department complaining of severe joint pain. Pt reports not being able to move some of her joints fully. She states she just started Humira 40 mg every two weeks. She states she has been taking Percocet, Fentanyl Patches, and Mobic and these medications are not helping her. She reports having a follow up appt on 07/28/13. She becomes tearful during the exam. She says that she is sad about her diagnosis and trying to find a solution for her pain. She states she is not driving home. She denies h/o DM. She denies fever.  Past Medical History  Diagnosis Date  . Fibromyalgia   . 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 Arthritis   . Rheumatoid arthritis(714.0)     autoimmune arthritis; methotrexate once/week  . Depression     takes meds daily  . Post-nasal drip     hx of  . Other specified rheumatoid arthritis, right shoulder 08/01/2011  . Asthma   . Allergy   . Chronic kidney disease   . Urinary incontinence   . Family history of malignant neoplasm of breast    Past Surgical History  Procedure Laterality Date  . Shoulder surgery  2012    right  . Back surgery  2011    lower back fusion l4-5, s1  . Bladder surgery  2009  . Tubal ligation  1988  . Breast enhancement surgery  2003  . Liposuction  2003    abdominal  . Tonsillectomy  1987  . Total shoulder arthroplasty  08/01/2011    Procedure: TOTAL SHOULDER ARTHROPLASTY;  Surgeon: Eulas Post, MD;  Location: [facility] OR;   Service: Orthopedics;  Laterality: Right;  Right total shoulder arthroplasty  . Mass excision  11/03/2011    Procedure: MINOR EXCISION OF MASS;  Surgeon: Wyn Forster., MD;  Location: [facility];  Service: Orthopedics;  Laterality: Left;  debride IP joint, cyst excision left index   Family History  Problem Relation Age of Onset  . Arthritis Mother   . Heart disease Mother   . Breast cancer Mother 75    TAH/BSO  . COPD Father   . Hypertension Father   . Breast cancer Maternal Aunt 27    deceased  . Cancer Cousin 42    female; unknown primary  . Colon cancer Paternal Aunt 29    deceased at 50  . Stomach cancer Paternal Uncle 6    deceased at 15   History  Substance Use Topics  . Smoking status: Never Smoker   . Smokeless tobacco: Not on file  . Alcohol Use: No   OB History   Grav Para Term Preterm Abortions TAB SAB Ect Mult Living                 Review of Systems  Constitutional: Negative for fever.  Musculoskeletal: Positive for arthralgias.  All other systems reviewed and are negative.   Allergies  Aspirin; Ibuprofen;  Ativan; and Tramadol hcl  Home Medications   Prior to Admission medications   Medication Sig Start Date End Date Taking? Authorizing Provider  amphetamine-dextroamphetamine (ADDERALL) 30 MG tablet Take 30 mg by mouth 3 (three) times daily. 01/15/13   Historical Provider, MD  ARIPiprazole (ABILIFY) 15 MG tablet Take 7.5 mg by mouth daily.    Historical Provider, MD  cyclobenzaprine (FLEXERIL) 10 MG tablet Take 1 tablet (10 mg total) by mouth 3 (three) times daily as needed for muscle spasms. 10/24/12   Gordy Savers, MD  estrogen, conjugated,-medroxyprogesterone (PREMPRO) 0.45-1.5 MG per tablet Take 1 tablet by mouth daily. 12/11/12   [name], MD  FLUoxetine (PROZAC) 20 MG capsule Take 20 mg by mouth daily.    Historical Provider, MD  HYDROcodone-acetaminophen (NORCO) 10-325 MG per tablet Take 1-2 tablets by mouth every  6 (six) hours as needed for moderate pain.     Historical Provider, MD  Levomilnacipran HCl ER 120 MG CP24 Take 120 mg by mouth daily.    Historical Provider, MD  meloxicam (MOBIC) 15 MG tablet Take 15 mg by mouth daily.  04/23/12   Historical Provider, MD  oxyCODONE-acetaminophen (PERCOCET/ROXICET) 5-325 MG per tablet Take 1 tablet by mouth every 4 (four) hours as needed for severe pain. With food 04/07/13   Clabe Seal, PA-C  predniSONE (DELTASONE) 20 MG tablet Take 2 tablets (40 mg total) by mouth daily. Take 40 mg by mouth daily for 3 days, then 20mg  by mouth daily for 3 days, then 10mg  daily for 3 days Take with food 04/07/13   Clabe Seal, PA-C  tretinoin (RETIN-A) 0.1 % cream Apply 1 application topically daily as needed (for acne). 11/20/12   [name], MD  venlafaxine XR (EFFEXOR-XR) 150 MG 24 hr capsule Take 150 mg by mouth daily with breakfast.    Historical Provider, MD  zolpidem (AMBIEN) 10 MG tablet Take 10 mg by mouth at bedtime as needed for sleep. per Joeseph Amor, PA    Historical Provider, MD   Triage Vitals: BP 151/87  Pulse 102  Temp(Src) 97.7 F (36.5 C) (Oral)  Resp 18  SpO2 99% Physical Exam  Nursing note and vitals reviewed. Constitutional: She is oriented to person, place, and time. She appears well-developed and well-nourished.  HENT:  Head: Normocephalic and atraumatic.  Eyes: EOM are normal.  Neck: Normal range of motion.  Cardiovascular: Normal rate.   Pulmonary/Chest: Effort normal.  Musculoskeletal: She exhibits tenderness.  No signs of trauma. Tenderness to palpation of multiple joints to upper and lower extremities without redness, weakness, swelling, or deformities.  Neurological: She is alert and oriented to person, place, and time.  Skin: Skin is warm and dry.  Psychiatric: She has a normal mood and affect. Her behavior is normal.    ED Course  Procedures (including critical care time) DIAGNOSTIC STUDIES: Oxygen Saturation is 99% on  RA, normal by my interpretation.   COORDINATION OF CARE: 6:09 PM- Will give Dilaudid IM and steroid injection. Pt verbalizes understanding and agrees to plan.  Medications  HYDROmorphone (DILAUDID) injection 2 mg (2 mg Intramuscular Given 07/21/13 1842)  dexamethasone (DECADRON) injection 10 mg (10 mg Intramuscular Given 07/21/13 1842)    Labs Review Labs Reviewed - No data to display  Imaging Review No results found.   EKG Interpretation None      MDM   Final diagnoses:  Rheumatoid arthritis flare    Patient given a shot of medication in the ED for  pain but since she is on a pain contract, I will not write her a prescription. She was also given a shot of steroid in the ED.  58 y.o.[name]  with back pain. No neurological deficits and normal neuro exam. Patient can walk but states is painful. No loss of bowel or bladder control. No concern for cauda equina. No fever, night sweats, weight loss, h/o cancer, IVDU. RICE protocol and pain medicine indicated and discussed with patient.   Patient Plan 1. Medications: pain medication, muscle relaxer and usual home medications  2. Treatment: rest, drink plenty of fluids, gentle stretching as discussed, alternate ice and heat  3. Follow Up: Please followup with your primary doctor for discussion of your diagnoses and further evaluation after today's visit; if you do not have a primary care doctor use the resource guide provided to find one   Vital signs are stable at discharge. Filed Vitals:   07/21/13 1648  BP: 151/87  Pulse: 102  Temp: 97.7 F (36.5 C)  Resp: 18    Patient/guardian has voiced understanding and agreed to follow-up with the PCP or specialist.   I personally performed the services described in this documentation, which was scribed in my presence. The recorded information has been reviewed and is accurate.    Dorthula Matas, PA-C 07/22/13 2031

## 2013-07-21 NOTE — ED Notes (Signed)
Pa  at bedside. 

## 2013-07-21 NOTE — Discharge Instructions (Signed)

## 2013-07-21 NOTE — ED Notes (Signed)
Pt reports that she has a hx of arthritis and her medication at home is not helping. States that she normal takes percocet but its not helping. Pt is tearful at triage

## 2013-07-22 NOTE — ED Provider Notes (Signed)
Medical screening examination/treatment/procedure(s) were performed by non-physician practitioner and as supervising physician I was immediately available for consultation/collaboration.   EKG Interpretation None       Ethelda Chick, MD 07/22/13 2033

## 2013-07-28 ENCOUNTER — Telehealth: Payer: Self-pay | Admitting: Family Medicine

## 2013-07-28 NOTE — Telephone Encounter (Signed)
That will be fine. Just inform her she may have to wait. Thanks

## 2013-07-28 NOTE — Telephone Encounter (Signed)
Pt has a scheduled appt for labs on 07/29/13 at 9:15, pt wants to know if she comes in at 8:00 is it possible to get her labs done. Pt states she is on two medications that makes her very hungry and if she does not eat she begins to shake a lot. Pt states she is willing to wait to see if she can get in before 9:15 if she is here at exactly 8:00am.

## 2013-07-29 ENCOUNTER — Other Ambulatory Visit: Payer: Managed Care, Other (non HMO)

## 2013-07-31 ENCOUNTER — Other Ambulatory Visit: Payer: Managed Care, Other (non HMO)

## 2013-08-01 ENCOUNTER — Other Ambulatory Visit (INDEPENDENT_AMBULATORY_CARE_PROVIDER_SITE_OTHER): Payer: Managed Care, Other (non HMO)

## 2013-08-01 DIAGNOSIS — Z0000 Encounter for general adult medical examination without abnormal findings: Secondary | ICD-10-CM

## 2013-08-01 LAB — HEPATIC FUNCTION PANEL
ALK PHOS: 85 U/L (ref 39–117)
ALT: 17 U/L (ref 0–35)
AST: 17 U/L (ref 0–37)
Albumin: 4 g/dL (ref 3.5–5.2)
BILIRUBIN DIRECT: 0 mg/dL (ref 0.0–0.3)
BILIRUBIN TOTAL: 0.5 mg/dL (ref 0.2–1.2)
TOTAL PROTEIN: 6.8 g/dL (ref 6.0–8.3)

## 2013-08-01 LAB — LIPID PANEL
CHOL/HDL RATIO: 4
Cholesterol: 225 mg/dL — ABNORMAL HIGH (ref 0–200)
HDL: 51.6 mg/dL (ref >39.00)
LDL Cholesterol: 121 mg/dL — ABNORMAL HIGH (ref 0–99)
Triglycerides: 262 mg/dL — ABNORMAL HIGH (ref 0.0–149.0)
VLDL: 52.4 mg/dL — ABNORMAL HIGH (ref 0.0–40.0)

## 2013-08-01 LAB — CBC WITH DIFFERENTIAL/PLATELET
Basophils Absolute: 0 10*3/uL (ref 0.0–0.1)
Basophils Relative: 0.4 % (ref 0.0–3.0)
EOS PCT: 3.7 % (ref 0.0–5.0)
Eosinophils Absolute: 0.3 10*3/uL (ref 0.0–0.7)
HEMATOCRIT: 39.9 % (ref 36.0–46.0)
Hemoglobin: 13.4 g/dL (ref 12.0–15.0)
LYMPHS ABS: 3.9 10*3/uL (ref 0.7–4.0)
Lymphocytes Relative: 48.8 % — ABNORMAL HIGH (ref 12.0–46.0)
MCHC: 33.6 g/dL (ref 30.0–36.0)
MCV: 82.6 fl (ref 78.0–100.0)
Monocytes Absolute: 0.6 10*3/uL (ref 0.1–1.0)
Monocytes Relative: 7.8 % (ref 3.0–12.0)
Neutro Abs: 3.2 10*3/uL (ref 1.4–7.7)
Neutrophils Relative %: 39.3 % — ABNORMAL LOW (ref 43.0–77.0)
PLATELETS: 376 10*3/uL (ref 150.0–400.0)
RBC: 4.83 Mil/uL (ref 3.87–5.11)
RDW: 12.9 % (ref 11.5–15.5)
WBC: 8 10*3/uL (ref 4.0–10.5)

## 2013-08-01 LAB — POCT URINALYSIS DIPSTICK
BILIRUBIN UA: NEGATIVE
GLUCOSE UA: NEGATIVE
KETONES UA: NEGATIVE
NITRITE UA: NEGATIVE
Protein, UA: NEGATIVE
Spec Grav, UA: >=1.03
Urobilinogen, UA: 0.2
pH, UA: 5

## 2013-08-01 LAB — TSH: TSH: 1.52 u[IU]/mL (ref 0.35–4.50)

## 2013-08-01 LAB — BASIC METABOLIC PANEL
BUN: 24 mg/dL — ABNORMAL HIGH (ref 6–23)
CALCIUM: 9.2 mg/dL (ref 8.4–10.5)
CO2: 24 meq/L (ref 19–32)
CREATININE: 0.8 mg/dL (ref 0.4–1.2)
Chloride: 109 mEq/L (ref 96–112)
GFR: 76.41 mL/min (ref >60.00)
GLUCOSE: 87 mg/dL (ref 70–99)
Potassium: 3.9 mEq/L (ref 3.5–5.1)
SODIUM: 140 meq/L (ref 135–145)

## 2013-08-04 ENCOUNTER — Encounter: Payer: Self-pay | Admitting: Family Medicine

## 2013-08-04 ENCOUNTER — Ambulatory Visit (INDEPENDENT_AMBULATORY_CARE_PROVIDER_SITE_OTHER): Payer: Managed Care, Other (non HMO) | Admitting: Family Medicine

## 2013-08-04 VITALS — BP 128/84 | HR 90 | Temp 98.6°F | Ht 64.0 in | Wt 161.0 lb

## 2013-08-04 DIAGNOSIS — Z0000 Encounter for general adult medical examination without abnormal findings: Secondary | ICD-10-CM

## 2013-08-04 DIAGNOSIS — R319 Hematuria, unspecified: Secondary | ICD-10-CM

## 2013-08-04 LAB — POCT URINALYSIS DIPSTICK
Bilirubin, UA: NEGATIVE
Blood, UA: NEGATIVE
GLUCOSE UA: NEGATIVE
Ketones, UA: NEGATIVE
Nitrite, UA: NEGATIVE
Protein, UA: NEGATIVE
Spec Grav, UA: 1.015
UROBILINOGEN UA: 0.2
pH, UA: 5

## 2013-08-04 MED ORDER — TRETINOIN 0.1 % EX CREA: 1.0000 "application " | TOPICAL_CREAM | Freq: Every day | CUTANEOUS | Status: DC | PRN

## 2013-08-04 NOTE — Progress Notes (Signed)
Pre visit review using our clinic review tool, if applicable. No additional management support is needed unless otherwise documented below in the visit note. 

## 2013-08-04 NOTE — Patient Instructions (Addendum)
Consider repeat lipid panel in 6-8 months.   Hypertriglyceridemia  Diet for High blood levels of Triglycerides Most fats in food are triglycerides. Triglycerides in your blood are stored as fat in your body. High levels of triglycerides in your blood may put you at a greater risk for heart disease and stroke.  Normal triglyceride levels are less than 150 mg/dL. Borderline high levels are 150-199 mg/dl. High levels are 200 - 499 mg/dL, and very high triglyceride levels are greater than 500 mg/dL. The decision to treat high triglycerides is generally based on the level. For people with borderline or high triglyceride levels, treatment includes weight loss and exercise. Drugs are recommended for people with very high triglyceride levels. Many people who need treatment for high triglyceride levels have metabolic syndrome. This syndrome is a collection of disorders that often include: insulin resistance, high blood pressure, blood clotting problems, high cholesterol and triglycerides. TESTING PROCEDURE FOR TRIGLYCERIDES  You should not eat 4 hours before getting your triglycerides measured. The normal range of triglycerides is between 10 and 250 milligrams per deciliter (mg/dl). Some people may have extreme levels (1000 or above), but your triglyceride level may be too high if it is above 150 mg/dl, depending on what other risk factors you have for heart disease.  People with high blood triglycerides may also have high blood cholesterol levels. If you have high blood cholesterol as well as high blood triglycerides, your risk for heart disease is probably greater than if you only had high triglycerides. High blood cholesterol is one of the main risk factors for heart disease. CHANGING YOUR DIET  Your weight can affect your blood triglyceride level. If you are more than 20% above your ideal body weight, you may be able to lower your blood triglycerides by losing weight. Eating less and exercising regularly is  the best way to combat this. Fat provides more calories than any other food. The best way to lose weight is to eat less fat. Only 30% of your total calories should come from fat. Less than 7% of your diet should come from saturated fat. A diet low in fat and saturated fat is the same as a diet to decrease blood cholesterol. By eating a diet lower in fat, you may lose weight, lower your blood cholesterol, and lower your blood triglyceride level.  Eating a diet low in fat, especially saturated fat, may also help you lower your blood triglyceride level. Ask your dietitian to help you figure how much fat you can eat based on the number of calories your caregiver has prescribed for you.  Exercise, in addition to helping with weight loss may also help lower triglyceride levels.   Alcohol can increase blood triglycerides. You may need to stop drinking alcoholic beverages.  Too much carbohydrate in your diet may also increase your blood triglycerides. Some complex carbohydrates are necessary in your diet. These may include bread, rice, potatoes, other starchy vegetables and cereals.  Reduce "simple" carbohydrates. These may include pure sugars, candy, honey, and jelly without losing other nutrients. If you have the kind of high blood triglycerides that is affected by the amount of carbohydrates in your diet, you will need to eat less sugar and less high-sugar foods. Your caregiver can help you with this.  Adding 2-4 grams of fish oil (EPA+ DHA) may also help lower triglycerides. Speak with your caregiver before adding any supplements to your regimen. Following the Diet  Maintain your ideal weight. Your caregivers can help you with  a diet. Generally, eating less food and getting more exercise will help you lose weight. Joining a weight control group may also help. Ask your caregivers for a good weight control group in your area.  Eat low-fat foods instead of high-fat foods. This can help you lose weight too.   These foods are lower in fat. Eat MORE of these:   Dried beans, peas, and lentils.  Egg whites.  Low-fat cottage cheese.  Fish.  Lean cuts of meat, such as round, sirloin, rump, and flank (cut extra fat off meat you fix).  Whole grain breads, cereals and pasta.  Skim and nonfat dry milk.  Low-fat yogurt.  Poultry without the skin.  Cheese made with skim or part-skim milk, such as mozzarella, parmesan, farmers', ricotta, or pot cheese. These are higher fat foods. Eat LESS of these:   Whole milk and foods made from whole milk, such as American, blue, cheddar, monterey jack, and swiss cheese  High-fat meats, such as luncheon meats, sausages, knockwurst, bratwurst, hot dogs, ribs, corned beef, ground pork, and regular ground beef.  Fried foods. Limit saturated fats in your diet. Substituting unsaturated fat for saturated fat may decrease your blood triglyceride level. You will need to read package labels to know which products contain saturated fats.  These foods are high in saturated fat. Eat LESS of these:   Fried pork skins.  Whole milk.  Skin and fat from poultry.  Palm oil.  Butter.  Shortening.  Cream cheese.  Tomasa Blase.  Margarines and baked goods made from listed oils.  Vegetable shortenings.  Chitterlings.  Fat from meats.  Coconut oil.  Palm kernel oil.  Lard.  Cream.  Sour cream.  Fatback.  Coffee whiteners and non-dairy creamers made with these oils.  Cheese made from whole milk. Use unsaturated fats (both polyunsaturated and monounsaturated) moderately. Remember, even though unsaturated fats are better than saturated fats; you still want a diet low in total fat.  These foods are high in unsaturated fat:   Canola oil.  Sunflower oil.  Mayonnaise.  Almonds.  Peanuts.  Pine nuts.  Margarines made with these oils.  Safflower oil.  Olive oil.  Avocados.  Cashews.  Peanut butter.  Sunflower seeds.  Soybean  oil.  Peanut oil.  Olives.  Pecans.  Walnuts.  Pumpkin seeds. Avoid sugar and other high-sugar foods. This will decrease carbohydrates without decreasing other nutrients. Sugar in your food goes rapidly to your blood. When there is excess sugar in your blood, your liver may use it to make more triglycerides. Sugar also contains calories without other important nutrients.  Eat LESS of these:   Sugar, brown sugar, powdered sugar, jam, jelly, preserves, honey, syrup, molasses, pies, candy, cakes, cookies, frosting, pastries, colas, soft drinks, punches, fruit drinks, and regular gelatin.  Avoid alcohol. Alcohol, even more than sugar, may increase blood triglycerides. In addition, alcohol is high in calories and low in nutrients. Ask for sparkling water, or a diet soft drink instead of an alcoholic beverage. Suggestions for planning and preparing meals   Bake, broil, grill or roast meats instead of frying.  Remove fat from meats and skin from poultry before cooking.  Add spices, herbs, lemon juice or vinegar to vegetables instead of salt, rich sauces or gravies.  Use a non-stick skillet without fat or use no-stick sprays.  Cool and refrigerate stews and broth. Then remove the hardened fat floating on the surface before serving.  Refrigerate meat drippings and skim off fat to make low-fat  gravies.  Serve more fish.  Use less butter, margarine and other high-fat spreads on bread or vegetables.  Use skim or reconstituted non-fat dry milk for cooking.  Cook with low-fat cheeses.  Substitute low-fat yogurt or cottage cheese for all or part of the sour cream in recipes for sauces, dips or congealed salads.  Use half yogurt/half mayonnaise in salad recipes.  Substitute evaporated skim milk for cream. Evaporated skim milk or reconstituted non-fat dry milk can be whipped and substituted for whipped cream in certain recipes.  Choose fresh fruits for dessert instead of high-fat foods  such as pies or cakes. Fruits are naturally low in fat. When Dining Out   Order low-fat appetizers such as fruit or vegetable juice, pasta with vegetables or tomato sauce.  Select clear, rather than cream soups.  Ask that dressings and gravies be served on the side. Then use less of them.  Order foods that are baked, broiled, poached, steamed, stir-fried, or roasted.  Ask for margarine instead of butter, and use only a small amount.  Drink sparkling water, unsweetened tea or coffee, or diet soft drinks instead of alcohol or other sweet beverages. QUESTIONS AND ANSWERS ABOUT OTHER FATS IN THE BLOOD: SATURATED FAT, TRANS FAT, AND CHOLESTEROL What is trans fat? Trans fat is a type of fat that is formed when vegetable oil is hardened through a process called hydrogenation. This process helps makes foods more solid, gives them shape, and prolongs their shelf life. Trans fats are also called hydrogenated or partially hydrogenated oils.  What do saturated fat, trans fat, and cholesterol in foods have to do with heart disease? Saturated fat, trans fat, and cholesterol in the diet all raise the level of LDL "bad" cholesterol in the blood. The higher the LDL cholesterol, the greater the risk for coronary heart disease (CHD). Saturated fat and trans fat raise LDL similarly.  What foods contain saturated fat, trans fat, and cholesterol? High amounts of saturated fat are found in animal products, such as fatty cuts of meat, chicken skin, and full-fat dairy products like butter, whole milk, cream, and cheese, and in tropical vegetable oils such as palm, palm kernel, and coconut oil. Trans fat is found in some of the same foods as saturated fat, such as vegetable shortening, some margarines (especially hard or stick margarine), crackers, cookies, baked goods, fried foods, salad dressings, and other processed foods made with partially hydrogenated vegetable oils. Small amounts of trans fat also occur naturally  in some animal products, such as milk products, beef, and lamb. Foods high in cholesterol include liver, other organ meats, egg yolks, shrimp, and full-fat dairy products. How can I use the new food label to make heart-healthy food choices? Check the Nutrition Facts panel of the food label. Choose foods lower in saturated fat, trans fat, and cholesterol. For saturated fat and cholesterol, you can also use the Percent Daily Value (%DV): 5% DV or less is low, and 20% DV or more is high. (There is no %DV for trans fat.) Use the Nutrition Facts panel to choose foods low in saturated fat and cholesterol, and if the trans fat is not listed, read the ingredients and limit products that list shortening or hydrogenated or partially hydrogenated vegetable oil, which tend to be high in trans fat. POINTS TO REMEMBER:   Discuss your risk for heart disease with your caregivers, and take steps to reduce risk factors.  Change your diet. Choose foods that are low in saturated fat, trans fat, and  cholesterol.  Add exercise to your daily routine if it is not already being done. Participate in physical activity of moderate intensity, like brisk walking, for at least 30 minutes on most, and preferably all days of the week. No time? Break the 30 minutes into three, 10-minute segments during the day.  Stop smoking. If you do smoke, contact your caregiver to discuss ways in which they can help you quit.  Do not use street drugs.  Maintain a normal weight.  Maintain a healthy blood pressure.  Keep up with your blood work for checking the fats in your blood as directed by your caregiver. Document Released: 12/30/2003 Document Revised: 09/12/2011 Document Reviewed: 07/27/2008 [facility] Patient Information 2014 [place], [place].

## 2013-08-04 NOTE — Progress Notes (Signed)
Subjective:    Patient ID: Kara [name], female    DOB: [birth year], 57 y.o.   MRN: [id number]  HPI Patient seen for complete physical.   She has had Pap smear last August which was normal. She gets mammograms every summer. Her mother had breast cancer. Patient has lost about 15 pounds due to her efforts with exercise and dietary change over the past several months. Overall feels well. She started Humira for rheumatoid arthritis recently and that has helped her joint pains tremendously. Her tetanus is up-to-date. She refuses flu vaccines. She is post menopause. Never smoked. Colonoscopy is up-to-date  Past Medical History  Diagnosis Date  . Fibromyalgia   . 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 Arthritis   . Rheumatoid arthritis(714.0)     autoimmune arthritis; methotrexate once/week  . Depression     takes meds daily  . Post-nasal drip     hx of  . Other specified rheumatoid arthritis, right shoulder 08/01/2011  . Asthma   . Allergy   . Chronic kidney disease   . Urinary incontinence   . Family history of malignant neoplasm of breast    Past Surgical History  Procedure Laterality Date  . Shoulder surgery  2012    right  . Back surgery  2011    lower back fusion l4-5, s1  . Bladder surgery  2009  . Tubal ligation  1988  . Breast enhancement surgery  2003  . Liposuction  2003    abdominal  . Tonsillectomy  1987  . Total shoulder arthroplasty  08/01/2011    Procedure: TOTAL SHOULDER ARTHROPLASTY;  Surgeon: Eulas Post, MD;  Location: [facility];  Service: Orthopedics;  Laterality: Right;  Right total shoulder arthroplasty  . Mass excision  11/03/2011    Procedure: MINOR EXCISION OF MASS;  Surgeon: Wyn Forster., MD;  Location: [facility];  Service: Orthopedics;  Laterality: Left;  debride IP joint, cyst excision left index    reports that she has never smoked. She does not have any smokeless tobacco history on file. She reports  that she uses illicit drugs (Marijuana). She reports that she does not drink alcohol. family history includes Arthritis in her mother; Breast cancer (age of onset: 49) in her maternal aunt; Breast cancer (age of onset: 25) in her mother; COPD in her father; Cancer (age of onset: 18) in her cousin; Colon cancer (age of onset: 54) in her paternal aunt; Heart disease in her mother; Hypertension in her father; Stomach cancer (age of onset: 18) in her paternal uncle. Allergies  Allergen Reactions  . Aspirin Anaphylaxis  . Ibuprofen Anaphylaxis  . Ativan [Lorazepam] Other (See Comments)    Makes agitated, combative   . Tramadol Hcl Itching and Rash      Review of Systems  Constitutional: Negative for fever, activity change, appetite change, fatigue and unexpected weight change.  HENT: Negative for ear pain, hearing loss, sore throat and trouble swallowing.   Eyes: Negative for visual disturbance.  Respiratory: Negative for cough and shortness of breath.   Cardiovascular: Negative for chest pain and palpitations.  Gastrointestinal: Negative for abdominal pain, diarrhea, constipation and blood in stool.  Genitourinary: Negative for dysuria and hematuria.  Musculoskeletal: Negative for arthralgias, back pain and myalgias.  Skin: Negative for rash.  Neurological: Negative for dizziness, syncope and headaches.  Hematological: Negative for adenopathy.  Psychiatric/Behavioral: Negative for confusion and dysphoric mood.  Objective:   Physical Exam  Constitutional: She appears well-developed and well-nourished. No distress.  HENT:  Right Ear: External ear normal.  Left Ear: External ear normal.  Mouth/Throat: Oropharynx is clear and moist.  Neck: Neck supple. No thyromegaly present.  Cardiovascular: Normal rate.   Pulmonary/Chest: Effort normal and breath sounds normal. No respiratory distress. She has no wheezes. She has no rales.  Abdominal: Soft. She exhibits no mass. There is no  tenderness. There is no rebound and no guarding.  Genitourinary:  Breast symmetric with no mass.  No nipple inversion or discharge.  Musculoskeletal: She exhibits no edema.          Assessment & Plan:  Health maintenance. Tetanus up-to-date. Labs reviewed with patient. She has dyslipidemia. Handouts to be given. She had initial blood on urine dipstick and repeat urine today reveals no blood. Continue weight loss efforts. Consider repeat lipids in about 6-8 months

## 2013-08-11 ENCOUNTER — Encounter: Payer: Self-pay | Admitting: Genetic Counselor

## 2013-08-11 NOTE — Progress Notes (Signed)
Referring Physician: Carolann Littler, MD   Ms. Yodice was called today to discuss genetic test results. Please see the Genetics note from her visit on 07/03/13.  GENETIC TESTING: At the time of Ms. Dicke's visit, we recommended she pursue genetic testing of multiple genes of 17 genes on the BreastNext gene panel. This test, which included sequencing and deletion/duplication analysis, was performed at [facility]. Testing was normal and did not reveal a mutation in these genes. The genes on the panel were ATM, BARD1, BRCA1, BRCA2, BRIP1, CDH1, CHEK2, MRE11A, MUTYH, NBN, NF1, PALB2, PTEN, RAD50, RAD51C, RAD51D, and TP53.  We discussed with Ms. Hellberg that since the current test is not perfect, it is possible there may be a gene mutation that current testing cannot detect, but that chance is small.  We also discussed that it is possible that a different genetic factor, which has not yet been discovered, is responsible for the cancer diagnoses in the family. If any family members develop cancer at a young age or develop a rare cancer, they are recommended to seek a genetics evaluation.  CANCER SCREENING:  This normal result is reassuring and indicates that Ms. Lycan does not likely have an increased risk of cancer due to a mutation in one of these genes. We recommended Ms. Remache continue to follow the cancer screening guidelines provided by her primary physician.   FAMILY MEMBERS:  Women in this family are at some increased risk of developing breast cancer, over the general population risk, simply due to the family history. We recommended they have a yearly mammogram beginning at age 7, a yearly clinical breast exam, and perform monthly breast self-exams. A gynecologic exam is recommended yearly. Colon cancer screening is recommended to begin by age 32.  Lastly, we discussed with Ms. Hamor that cancer genetics is a rapidly advancing field and it is possible that new genetic tests will be appropriate for her in the  future. We encouraged her to remain in contact with [place] on an annual basis so we can update her personal and family histories, and let her know of advances in cancer genetics that may benefit the family. Our contact number was provided. Ms. Pyeatt questions were answered to her satisfaction today, and she knows she is welcome to call anytime with additional questions.    Steele Berg, MS, [employer] Cert[employer] Genetic Counseor phone: [phone] [email or web address]

## 2013-09-15 ENCOUNTER — Other Ambulatory Visit (HOSPITAL_COMMUNITY): Payer: 59

## 2013-09-16 ENCOUNTER — Other Ambulatory Visit (HOSPITAL_COMMUNITY): Payer: 59

## 2013-09-17 ENCOUNTER — Other Ambulatory Visit (HOSPITAL_COMMUNITY): Payer: 59

## 2013-09-18 ENCOUNTER — Other Ambulatory Visit (HOSPITAL_COMMUNITY): Payer: 59

## 2013-09-19 ENCOUNTER — Other Ambulatory Visit (HOSPITAL_COMMUNITY): Payer: 59

## 2013-09-22 ENCOUNTER — Other Ambulatory Visit (HOSPITAL_COMMUNITY): Payer: 59

## 2013-09-23 ENCOUNTER — Other Ambulatory Visit (HOSPITAL_COMMUNITY): Payer: 59

## 2013-09-24 ENCOUNTER — Other Ambulatory Visit (HOSPITAL_COMMUNITY): Payer: 59

## 2013-09-25 ENCOUNTER — Other Ambulatory Visit (HOSPITAL_COMMUNITY): Payer: 59

## 2013-09-29 ENCOUNTER — Other Ambulatory Visit (HOSPITAL_COMMUNITY): Payer: 59

## 2013-09-30 ENCOUNTER — Other Ambulatory Visit (HOSPITAL_COMMUNITY): Payer: 59

## 2013-10-01 ENCOUNTER — Other Ambulatory Visit (HOSPITAL_COMMUNITY): Payer: 59

## 2013-10-02 ENCOUNTER — Other Ambulatory Visit (HOSPITAL_COMMUNITY): Payer: 59

## 2013-10-03 ENCOUNTER — Other Ambulatory Visit (HOSPITAL_COMMUNITY): Payer: 59

## 2013-10-06 ENCOUNTER — Other Ambulatory Visit (HOSPITAL_COMMUNITY): Payer: 59

## 2013-10-07 ENCOUNTER — Other Ambulatory Visit (HOSPITAL_COMMUNITY): Payer: 59

## 2013-10-08 ENCOUNTER — Other Ambulatory Visit (HOSPITAL_COMMUNITY): Payer: 59

## 2013-10-09 ENCOUNTER — Other Ambulatory Visit (HOSPITAL_COMMUNITY): Payer: 59

## 2013-10-10 ENCOUNTER — Other Ambulatory Visit (HOSPITAL_COMMUNITY): Payer: 59

## 2013-10-13 ENCOUNTER — Other Ambulatory Visit (HOSPITAL_COMMUNITY): Payer: 59

## 2013-10-14 ENCOUNTER — Other Ambulatory Visit (HOSPITAL_COMMUNITY): Payer: 59

## 2013-10-15 ENCOUNTER — Other Ambulatory Visit (HOSPITAL_COMMUNITY): Payer: 59

## 2013-10-16 ENCOUNTER — Other Ambulatory Visit (HOSPITAL_COMMUNITY): Payer: 59

## 2013-10-17 ENCOUNTER — Other Ambulatory Visit (HOSPITAL_COMMUNITY): Payer: 59

## 2013-10-20 ENCOUNTER — Other Ambulatory Visit (HOSPITAL_COMMUNITY): Payer: 59

## 2013-10-21 ENCOUNTER — Other Ambulatory Visit (HOSPITAL_COMMUNITY): Payer: 59

## 2013-10-22 ENCOUNTER — Other Ambulatory Visit (HOSPITAL_COMMUNITY): Payer: 59

## 2013-10-23 ENCOUNTER — Other Ambulatory Visit (HOSPITAL_COMMUNITY): Payer: 59

## 2013-10-24 ENCOUNTER — Other Ambulatory Visit (HOSPITAL_COMMUNITY): Payer: 59

## 2013-10-25 HISTORY — PX: BREAST IMPLANT REMOVAL: SUR1101

## 2013-11-25 DIAGNOSIS — N61 Mastitis without abscess: Secondary | ICD-10-CM

## 2013-11-25 HISTORY — DX: Mastitis without abscess: N61.0

## 2013-12-18 ENCOUNTER — Encounter: Payer: Self-pay | Admitting: Family Medicine

## 2013-12-18 ENCOUNTER — Other Ambulatory Visit: Payer: Self-pay

## 2013-12-18 MED ORDER — AMOXICILLIN 500 MG PO CAPS: ORAL_CAPSULE | ORAL | Status: DC

## 2014-01-04 ENCOUNTER — Encounter (HOSPITAL_COMMUNITY): Payer: Self-pay | Admitting: Emergency Medicine

## 2014-01-04 ENCOUNTER — Emergency Department (HOSPITAL_COMMUNITY)
Admission: EM | Admit: 2014-01-04 | Discharge: 2014-01-04 | Disposition: A | Discharge status: Home / Self Care | Payer: Managed Care, Other (non HMO) | Attending: Emergency Medicine | Admitting: Emergency Medicine

## 2014-01-04 DIAGNOSIS — J45909 Unspecified asthma, uncomplicated: Secondary | ICD-10-CM | POA: Diagnosis not present

## 2014-01-04 DIAGNOSIS — N649 Disorder of breast, unspecified: Secondary | ICD-10-CM | POA: Diagnosis present

## 2014-01-04 DIAGNOSIS — N189 Chronic kidney disease, unspecified: Secondary | ICD-10-CM | POA: Diagnosis not present

## 2014-01-04 DIAGNOSIS — M069 Rheumatoid arthritis, unspecified: Secondary | ICD-10-CM | POA: Diagnosis not present

## 2014-01-04 DIAGNOSIS — F32A Depression, unspecified: Secondary | ICD-10-CM

## 2014-01-04 DIAGNOSIS — Z853 Personal history of malignant neoplasm of breast: Secondary | ICD-10-CM | POA: Diagnosis not present

## 2014-01-04 DIAGNOSIS — Z79899 Other long term (current) drug therapy: Secondary | ICD-10-CM | POA: Insufficient documentation

## 2014-01-04 DIAGNOSIS — N61 Inflammatory disorders of breast: Secondary | ICD-10-CM | POA: Insufficient documentation

## 2014-01-04 DIAGNOSIS — F329 Major depressive disorder, single episode, unspecified: Secondary | ICD-10-CM | POA: Insufficient documentation

## 2014-01-04 DIAGNOSIS — Z791 Long term (current) use of non-steroidal anti-inflammatories (NSAID): Secondary | ICD-10-CM | POA: Diagnosis not present

## 2014-01-04 LAB — COMPREHENSIVE METABOLIC PANEL
ALT: 19 U/L (ref 0–35)
ANION GAP: 12 (ref 5–15)
AST: 14 U/L (ref 0–37)
Albumin: 3.3 g/dL — ABNORMAL LOW (ref 3.5–5.2)
Alkaline Phosphatase: 228 U/L — ABNORMAL HIGH (ref 39–117)
BUN: 13 mg/dL (ref 6–23)
CO2: 27 meq/L (ref 19–32)
Calcium: 9.4 mg/dL (ref 8.4–10.5)
Chloride: 100 mEq/L (ref 96–112)
Creatinine, Ser: 0.6 mg/dL (ref 0.50–1.10)
GFR calc non Af Amer: >90 mL/min (ref >90)
GLUCOSE: 121 mg/dL — AB (ref 70–99)
POTASSIUM: 3.6 meq/L — AB (ref 3.7–5.3)
Sodium: 139 mEq/L (ref 137–147)
Total Bilirubin: <0.2 mg/dL — ABNORMAL LOW (ref 0.3–1.2)
Total Protein: 7.6 g/dL (ref 6.0–8.3)

## 2014-01-04 LAB — CBC WITH DIFFERENTIAL/PLATELET
Basophils Absolute: 0 10*3/uL (ref 0.0–0.1)
Basophils Relative: 0 % (ref 0–1)
Eosinophils Absolute: 0.2 10*3/uL (ref 0.0–0.7)
Eosinophils Relative: 2 % (ref 0–5)
HCT: 35.7 % — ABNORMAL LOW (ref 36.0–46.0)
HEMOGLOBIN: 11.8 g/dL — AB (ref 12.0–15.0)
LYMPHS PCT: 34 % (ref 12–46)
Lymphs Abs: 2.5 10*3/uL (ref 0.7–4.0)
MCH: 27.4 pg (ref 26.0–34.0)
MCHC: 33.1 g/dL (ref 30.0–36.0)
MCV: 82.8 fL (ref 78.0–100.0)
MONOS PCT: 8 % (ref 3–12)
Monocytes Absolute: 0.6 10*3/uL (ref 0.1–1.0)
NEUTROS PCT: 56 % (ref 43–77)
Neutro Abs: 4.1 10*3/uL (ref 1.7–7.7)
PLATELETS: 394 10*3/uL (ref 150–400)
RBC: 4.31 MIL/uL (ref 3.87–5.11)
RDW: 13.3 % (ref 11.5–15.5)
WBC: 7.4 10*3/uL (ref 4.0–10.5)

## 2014-01-04 MED ORDER — HYDROMORPHONE HCL 1 MG/ML IJ SOLN
1.0000 mg | Freq: Once | INTRAMUSCULAR | Status: AC
  Administered 2014-01-04: 1 mg via INTRAVENOUS
  Filled 2014-01-04: qty 1

## 2014-01-04 MED ORDER — DIAZEPAM 5 MG/ML IJ SOLN
5.0000 mg | Freq: Once | INTRAMUSCULAR | Status: AC
  Administered 2014-01-04: 5 mg via INTRAVENOUS
  Filled 2014-01-04: qty 2

## 2014-01-04 MED ORDER — LORAZEPAM 2 MG/ML IJ SOLN: 1.0000 mg | Freq: Once | INTRAMUSCULAR | Status: DC

## 2014-01-04 MED ORDER — CLINDAMYCIN PHOSPHATE 600 MG/50ML IV SOLN
600.0000 mg | Freq: Once | INTRAVENOUS | Status: AC
  Administered 2014-01-04: 600 mg via INTRAVENOUS
  Filled 2014-01-04: qty 50

## 2014-01-04 MED ORDER — CLINDAMYCIN HCL 300 MG PO CAPS
300.0000 mg | ORAL_CAPSULE | Freq: Four times a day (QID) | ORAL | Status: DC
Start: 2014-01-04 — End: 2014-01-09

## 2014-01-04 NOTE — ED Provider Notes (Signed)
Medical screening examination/treatment/procedure(s) were performed by non-physician practitioner and as supervising physician I was immediately available for consultation/collaboration.   EKG Interpretation None        Lyanne Co, MD 01/04/14 1530

## 2014-01-04 NOTE — ED Notes (Signed)
Robin PA at bedside to speak with patient about plan

## 2014-01-04 NOTE — Discharge Instructions (Signed)
Take antibiotic to completion. Follow up with your primary care doctor, surgeon and counselor.  Cellulitis Cellulitis is an infection of the skin and the tissue beneath it. The infected area is usually red and tender. Cellulitis occurs most often in the arms and lower legs.  CAUSES  Cellulitis is caused by bacteria that enter the skin through cracks or cuts in the skin. The most common types of bacteria that cause cellulitis are staphylococci and streptococci. SIGNS AND SYMPTOMS   Redness and warmth.  Swelling.  Tenderness or pain.  Fever. DIAGNOSIS  Your health care provider can usually determine what is wrong based on a physical exam. Blood tests may also be done. TREATMENT  Treatment usually involves taking an antibiotic medicine. HOME CARE INSTRUCTIONS   Take your antibiotic medicine as directed by your health care provider. Finish the antibiotic even if you start to feel better.  Keep the infected arm or leg elevated to reduce swelling.  Apply a warm cloth to the affected area up to 4 times per day to relieve pain.  Take medicines only as directed by your health care provider.  Keep all follow-up visits as directed by your health care provider. SEEK MEDICAL CARE IF:   You notice red streaks coming from the infected area.  Your red area gets larger or turns dark in color.  Your bone or joint underneath the infected area becomes painful after the skin has healed.  Your infection returns in the same area or another area.  You notice a swollen bump in the infected area.  You develop new symptoms.  You have a fever. SEEK IMMEDIATE MEDICAL CARE IF:   You feel very sleepy.  You develop vomiting or diarrhea.  You have a general ill feeling (malaise) with muscle aches and pains. MAKE SURE YOU:   Understand these instructions.  Will watch your condition.  Will get help right away if you are not doing well or get worse. Document Released: 12/21/2004 Document  Revised: 07/28/2013 Document Reviewed: 05/29/2011 [facility] Patient Information 2015 [place], [place]. This information is not intended to replace advice given to you by your health care provider. Make sure you discuss any questions you have with your health care provider.  Depression Depression refers to feeling sad, low, down in the dumps, blue, gloomy, or empty. In general, there are two kinds of depression: 1. Normal sadness or normal grief. This kind of depression is one that we all feel from time to time after upsetting life experiences, such as the loss of a job or the ending of a relationship. This kind of depression is considered normal, is short lived, and resolves within a few days to 2 weeks. Depression experienced after the loss of a loved one (bereavement) often lasts longer than 2 weeks but normally gets better with time. 2. Clinical depression. This kind of depression lasts longer than normal sadness or normal grief or interferes with your ability to function at home, at work, and in school. It also interferes with your personal relationships. It affects almost every aspect of your life. Clinical depression is an illness. Symptoms of depression can also be caused by conditions other than those mentioned above, such as:  Physical illness. Some physical illnesses, including underactive thyroid gland (hypothyroidism), severe anemia, specific types of cancer, diabetes, uncontrolled seizures, heart and lung problems, strokes, and chronic pain are commonly associated with symptoms of depression.  Side effects of some prescription medicine. In some people, certain types of medicine can cause symptoms of  depression.  Substance abuse. Abuse of alcohol and illicit drugs can cause symptoms of depression. SYMPTOMS Symptoms of normal sadness and normal grief include the following:  Feeling sad or crying for short periods of time.  Not caring about anything (apathy).  Difficulty sleeping or  sleeping too much.  No longer able to enjoy the things you used to enjoy.  Desire to be by oneself all the time (social isolation).  Lack of energy or motivation.  Difficulty concentrating or remembering.  Change in appetite or weight.  Restlessness or agitation. Symptoms of clinical depression include the same symptoms of normal sadness or normal grief and also the following symptoms:  Feeling sad or crying all the time.  Feelings of guilt or worthlessness.  Feelings of hopelessness or helplessness.  Thoughts of suicide or the desire to harm yourself (suicidal ideation).  Loss of touch with reality (psychotic symptoms). Seeing or hearing things that are not real (hallucinations) or having false beliefs about your life or the people around you (delusions and paranoia). DIAGNOSIS  The diagnosis of clinical depression is usually based on how bad the symptoms are and how long they have lasted. Your health care provider will also ask you questions about your medical history and substance use to find out if physical illness, use of prescription medicine, or substance abuse is causing your depression. Your health care provider may also order blood tests. TREATMENT  Often, normal sadness and normal grief do not require treatment. However, sometimes antidepressant medicine is given for bereavement to ease the depressive symptoms until they resolve. The treatment for clinical depression depends on how bad the symptoms are but often includes antidepressant medicine, counseling with a mental health professional, or both. Your health care provider will help to determine what treatment is best for you. Depression caused by physical illness usually goes away with appropriate medical treatment of the illness. If prescription medicine is causing depression, talk with your health care provider about stopping the medicine, decreasing the dose, or changing to another medicine. Depression caused by the  abuse of alcohol or illicit drugs goes away when you stop using these substances. Some adults need professional help in order to stop drinking or using drugs. SEEK IMMEDIATE MEDICAL CARE IF:  You have thoughts about hurting yourself or others.  You lose touch with reality (have psychotic symptoms).  You are taking medicine for depression and have a serious side effect. FOR MORE INFORMATION  National Alliance on Mental Illness: www.nami.[employer] of Mental Health: http://www.maynard.net/ Document Released: 03/10/2000 Document Revised: 07/28/2013 Document Reviewed: 06/12/2011 [facility] Patient Information 2015 [place], [place]. This information is not intended to replace advice given to you by your health care provider. Make sure you discuss any questions you have with your health care provider.

## 2014-01-04 NOTE — ED Provider Notes (Signed)
CSN: [id number]     Arrival date & time 01/04/14  1227 History   First MD Initiated Contact with Patient 01/04/14 1411     Chief Complaint  Patient presents with  . Arthritis  . Fibromyalgia  . Breast Problem     (Consider location/radiation/quality/duration/timing/severity/associated sxs/prior Treatment) HPI Comments: This is a 58 y/o female with a PMHx of fibromyalgia, RA, depression and asthma who presents to the ED with concerns of a worsening right breast infection x 1 week. She recently had her breast implants removed by Dr. [name], and she noticed some redness after the surgery 1 week ago, was started on amoxicillin, however the infection appears to be worse. States her breast is painful and feels warm. Denies fevers. She reports when she has an infection, it triggers her chronic pain, RA pain and fibromyalgia. She has a contract with the pain clinic and is on percocet, however it does not help. States she hates constantly having pain and it makes her feel depressed. She has a Veterinary surgeon and psychiatrist, and works as part of an ACT team, however reports there is only so much medication that can help. Denies suicidal or homicidal ideations.  Patient is a 58 y.o. female presenting with arthritis. The history is provided by the patient.  Arthritis Associated symptoms include arthralgias and myalgias.    Past Medical History  Diagnosis Date  . Fibromyalgia   . 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 Arthritis   . Rheumatoid arthritis(714.0)     autoimmune arthritis; methotrexate once/week  . Depression     takes meds daily  . Post-nasal drip     hx of  . Other specified rheumatoid arthritis, right shoulder 08/01/2011  . Asthma   . Allergy   . Chronic kidney disease   . Urinary incontinence   . Family history of malignant neoplasm of breast    Past Surgical History  Procedure Laterality Date  . Shoulder surgery  2012    right  . Back surgery   2011    lower back fusion l4-5, s1  . Bladder surgery  2009  . Tubal ligation  1988  . Breast enhancement surgery  2003  . Liposuction  2003    abdominal  . Tonsillectomy  1987  . Total shoulder arthroplasty  08/01/2011    Procedure: TOTAL SHOULDER ARTHROPLASTY;  Surgeon: Eulas Post, MD;  Location: [facility] OR;  Service: Orthopedics;  Laterality: Right;  Right total shoulder arthroplasty  . Mass excision  11/03/2011    Procedure: MINOR EXCISION OF MASS;  Surgeon: Wyn Forster., MD;  Location: [facility];  Service: Orthopedics;  Laterality: Left;  debride IP joint, cyst excision left index   Family History  Problem Relation Age of Onset  . Arthritis Mother   . Heart disease Mother     ?psvt  . Breast cancer Mother 33    TAH/BSO  . COPD Father   . Hypertension Father   . Breast cancer Maternal Aunt 27    deceased  . Cancer Cousin 70    female; unknown primary  . Colon cancer Paternal Aunt 37    deceased at 63  . Stomach cancer Paternal Uncle 86    deceased at 49   History  Substance Use Topics  . Smoking status: Never Smoker   . Smokeless tobacco: Not on file  . Alcohol Use: No   OB History   Grav Para  Term Preterm Abortions TAB SAB Ect Mult Living                 Review of Systems  Musculoskeletal: Positive for arthralgias, arthritis and myalgias.  Skin: Positive for color change.  All other systems reviewed and are negative.     Allergies  Aspirin; Ibuprofen; Ativan; and Tramadol hcl  Home Medications   Prior to Admission medications   Medication Sig Start Date End Date Taking? Authorizing Provider  Adalimumab (HUMIRA) 40 MG/0.8ML PSKT Inject 40 mg into the skin every 14 (fourteen) days.   Yes Historical Provider, MD  ALPRAZolam Prudy Feeler) 0.5 MG tablet Take 0.5 mg by mouth 2 (two) times daily as needed. 12/17/13  Yes Historical Provider, MD  amoxicillin (AMOXIL) 500 MG capsule Take 4 tablets one hour prior to procedure. 12/18/13  Yes [name], MD  amphetamine-dextroamphetamine (ADDERALL) 30 MG tablet Take 30 mg by mouth 3 (three) times daily. 01/15/13  Yes Historical Provider, MD  fentaNYL (DURAGESIC - DOSED MCG/HR) 25 MCG/HR patch Place 25 mcg onto the skin every 3 (three) days.  07/28/13  Yes Historical Provider, MD  meloxicam (MOBIC) 15 MG tablet Take 15 mg by mouth daily.  04/23/12  Yes Historical Provider, MD  oxyCODONE-acetaminophen (PERCOCET/ROXICET) 5-325 MG per tablet Take 1 tablet by mouth every 4 (four) hours as needed for severe pain. With food 04/07/13  Yes Mellody Drown, PA-C  tretinoin (RETIN-A) 0.1 % cream Apply 1 application topically daily as needed (for acne). 08/04/13  Yes [name], MD  zolpidem (AMBIEN) 10 MG tablet Take 10 mg by mouth at bedtime as needed for sleep.    Yes Historical Provider, MD  clindamycin (CLEOCIN) 300 MG capsule Take 1 capsule (300 mg total) by mouth 4 (four) times daily. X 7 days 01/04/14   Nada Boozer Janeil Schexnayder, PA-C   BP 106/68  Pulse 80  Temp(Src) 98.5 F (36.9 C) (Oral)  Resp 15  Ht 5\' 4"  (1.626 m)  Wt 145 lb (65.772 kg)  BMI 24.88 kg/m2  SpO2 97% Physical Exam  Nursing note and vitals reviewed. Constitutional: She is oriented to person, place, and time. She appears well-developed and well-nourished. No distress.  HENT:  Head: Normocephalic and atraumatic.  Mouth/Throat: Oropharynx is clear and moist.  Eyes: Conjunctivae are normal.  Neck: Normal range of motion. Neck supple.  Cardiovascular: Normal rate, regular rhythm and normal heart sounds.   Pulmonary/Chest: Effort normal and breath sounds normal.    Abdominal: Soft. Bowel sounds are normal. There is no tenderness.  Musculoskeletal: Normal range of motion. She exhibits no edema.  Lymphadenopathy:    She has no axillary adenopathy.       Right: No supraclavicular adenopathy present.  Neurological: She is alert and oriented to person, place, and time.  Skin: Skin is warm and dry. She is not diaphoretic.    Psychiatric: Her behavior is normal. Her mood appears anxious. She exhibits a depressed mood.  Tearful.    ED Course  Procedures (including critical care time) Labs Review Labs Reviewed  CBC WITH DIFFERENTIAL - Abnormal; Notable for the following:    Hemoglobin 11.8 (*)    HCT 35.7 (*)    All other components within normal limits  COMPREHENSIVE METABOLIC PANEL - Abnormal; Notable for the following:    Potassium 3.6 (*)    Glucose, Bld 121 (*)    Albumin 3.3 (*)    Alkaline Phosphatase 228 (*)    Total Bilirubin <0.2 (*)  All other components within normal limits    Imaging Review No results found.   EKG Interpretation None      MDM   Final diagnoses:  Cellulitis of female breast  Depression   Patient nontoxic appearing and in no apparent distress. Afebrile, vital signs stable. She is anxious and depressed. Right breast cellulitis noted without streaking or adenopathy. She was on amoxicillin prior, a dose of IV clindamycin was given in the emergency department. Given the fact that she is afebrile with no leukocytosis, I do not feel admission is necessary for her cellulitis. Regarding chronic pain, depression and anxiety, I advised her to followup with her PCP, counselor and psychiatrist. She is not suicidal or homicidal. Will d/c home with clinda PO. Stable for d/c. Return precautions given. Patient states understanding of treatment care plan and is agreeable.   Kathrynn Speed, PA-C 01/04/14 1526

## 2014-01-04 NOTE — ED Notes (Addendum)
Pt reports history of rheumatoid arthritis, fibromyalgia, and breast infection that started about 1 week ago. Pt had breast implants removed recently and is currently being treated with antibiotics. Pt began crying in triage, reports, "I just want to die." Pt request to talk with someone to help her with depression. Pt denies being suicidal.

## 2014-01-09 ENCOUNTER — Encounter (HOSPITAL_COMMUNITY): Payer: Self-pay | Admitting: Emergency Medicine

## 2014-01-09 ENCOUNTER — Inpatient Hospital Stay (HOSPITAL_COMMUNITY)
Admission: EM | Admit: 2014-01-09 | Discharge: 2014-01-11 | DRG: 601 | Disposition: A | Discharge status: Home / Self Care | Payer: Managed Care, Other (non HMO) | Attending: Internal Medicine | Admitting: Internal Medicine

## 2014-01-09 ENCOUNTER — Emergency Department (HOSPITAL_COMMUNITY): Payer: Managed Care, Other (non HMO)

## 2014-01-09 DIAGNOSIS — D72829 Elevated white blood cell count, unspecified: Secondary | ICD-10-CM | POA: Diagnosis present

## 2014-01-09 DIAGNOSIS — Z79899 Other long term (current) drug therapy: Secondary | ICD-10-CM | POA: Diagnosis not present

## 2014-01-09 DIAGNOSIS — M069 Rheumatoid arthritis, unspecified: Secondary | ICD-10-CM | POA: Diagnosis present

## 2014-01-09 DIAGNOSIS — Z885 Allergy status to narcotic agent status: Secondary | ICD-10-CM | POA: Diagnosis not present

## 2014-01-09 DIAGNOSIS — Z87442 Personal history of urinary calculi: Secondary | ICD-10-CM

## 2014-01-09 DIAGNOSIS — F329 Major depressive disorder, single episode, unspecified: Secondary | ICD-10-CM | POA: Diagnosis present

## 2014-01-09 DIAGNOSIS — Z96611 Presence of right artificial shoulder joint: Secondary | ICD-10-CM | POA: Diagnosis present

## 2014-01-09 DIAGNOSIS — N63 Unspecified lump in unspecified breast: Secondary | ICD-10-CM

## 2014-01-09 DIAGNOSIS — L0390 Cellulitis, unspecified: Secondary | ICD-10-CM

## 2014-01-09 DIAGNOSIS — Z9889 Other specified postprocedural states: Secondary | ICD-10-CM

## 2014-01-09 DIAGNOSIS — N189 Chronic kidney disease, unspecified: Secondary | ICD-10-CM | POA: Diagnosis present

## 2014-01-09 DIAGNOSIS — M06811 Other specified rheumatoid arthritis, right shoulder: Secondary | ICD-10-CM

## 2014-01-09 DIAGNOSIS — Z803 Family history of malignant neoplasm of breast: Secondary | ICD-10-CM

## 2014-01-09 DIAGNOSIS — J45909 Unspecified asthma, uncomplicated: Secondary | ICD-10-CM | POA: Diagnosis present

## 2014-01-09 DIAGNOSIS — N61 Inflammatory disorders of breast: Principal | ICD-10-CM | POA: Diagnosis present

## 2014-01-09 DIAGNOSIS — Z8619 Personal history of other infectious and parasitic diseases: Secondary | ICD-10-CM

## 2014-01-09 DIAGNOSIS — M797 Fibromyalgia: Secondary | ICD-10-CM | POA: Diagnosis present

## 2014-01-09 DIAGNOSIS — Z8709 Personal history of other diseases of the respiratory system: Secondary | ICD-10-CM

## 2014-01-09 DIAGNOSIS — Z888 Allergy status to other drugs, medicaments and biological substances status: Secondary | ICD-10-CM | POA: Diagnosis not present

## 2014-01-09 DIAGNOSIS — F32A Depression, unspecified: Secondary | ICD-10-CM

## 2014-01-09 DIAGNOSIS — F419 Anxiety disorder, unspecified: Secondary | ICD-10-CM | POA: Diagnosis present

## 2014-01-09 DIAGNOSIS — F1290 Cannabis use, unspecified, uncomplicated: Secondary | ICD-10-CM | POA: Diagnosis present

## 2014-01-09 DIAGNOSIS — F988 Other specified behavioral and emotional disorders with onset usually occurring in childhood and adolescence: Secondary | ICD-10-CM

## 2014-01-09 HISTORY — DX: Mastitis without abscess: N61.0

## 2014-01-09 HISTORY — DX: Other chronic pain: G89.29

## 2014-01-09 LAB — CBC WITH DIFFERENTIAL/PLATELET
BASOS PCT: 0 % (ref 0–1)
Basophils Absolute: 0 10*3/uL (ref 0.0–0.1)
Eosinophils Absolute: 0 10*3/uL (ref 0.0–0.7)
Eosinophils Relative: 0 % (ref 0–5)
HCT: 37.6 % (ref 36.0–46.0)
Hemoglobin: 12.2 g/dL (ref 12.0–15.0)
Lymphocytes Relative: 17 % (ref 12–46)
Lymphs Abs: 2.6 10*3/uL (ref 0.7–4.0)
MCH: 27.4 pg (ref 26.0–34.0)
MCHC: 32.4 g/dL (ref 30.0–36.0)
MCV: 84.3 fL (ref 78.0–100.0)
Monocytes Absolute: 1.2 10*3/uL — ABNORMAL HIGH (ref 0.1–1.0)
Monocytes Relative: 7 % (ref 3–12)
NEUTROS ABS: 11.9 10*3/uL — AB (ref 1.7–7.7)
NEUTROS PCT: 76 % (ref 43–77)
PLATELETS: 518 10*3/uL — AB (ref 150–400)
RBC: 4.46 MIL/uL (ref 3.87–5.11)
RDW: 12.8 % (ref 11.5–15.5)
WBC: 15.7 10*3/uL — ABNORMAL HIGH (ref 4.0–10.5)

## 2014-01-09 LAB — BASIC METABOLIC PANEL
ANION GAP: 13 (ref 5–15)
BUN: 18 mg/dL (ref 6–23)
CO2: 26 mEq/L (ref 19–32)
Calcium: 10.3 mg/dL (ref 8.4–10.5)
Chloride: 99 mEq/L (ref 96–112)
Creatinine, Ser: 0.54 mg/dL (ref 0.50–1.10)
Glucose, Bld: 107 mg/dL — ABNORMAL HIGH (ref 70–99)
POTASSIUM: 4.5 meq/L (ref 3.7–5.3)
SODIUM: 138 meq/L (ref 137–147)

## 2014-01-09 LAB — HEPATIC FUNCTION PANEL
ALT: 13 U/L (ref 0–35)
AST: 17 U/L (ref 0–37)
Albumin: 3.9 g/dL (ref 3.5–5.2)
Alkaline Phosphatase: 205 U/L — ABNORMAL HIGH (ref 39–117)
Bilirubin, Direct: <0.2 mg/dL (ref 0.0–0.3)
TOTAL PROTEIN: 8.7 g/dL — AB (ref 6.0–8.3)
Total Bilirubin: <0.2 mg/dL — ABNORMAL LOW (ref 0.3–1.2)

## 2014-01-09 LAB — TROPONIN I: Troponin I: <0.3 ng/mL (ref <0.30)

## 2014-01-09 LAB — TSH: TSH: 0.589 u[IU]/mL (ref 0.350–4.500)

## 2014-01-09 LAB — LACTIC ACID, PLASMA: Lactic Acid, Venous: 1.5 mmol/L (ref 0.5–2.2)

## 2014-01-09 MED ORDER — ADALIMUMAB 40 MG/0.8ML ~~LOC~~ PSKT: 40.0000 mg | PREFILLED_SYRINGE | SUBCUTANEOUS | Status: DC

## 2014-01-09 MED ORDER — ACETAMINOPHEN 325 MG PO TABS: 650.0000 mg | ORAL_TABLET | Freq: Four times a day (QID) | ORAL | Status: DC | PRN

## 2014-01-09 MED ORDER — SODIUM CHLORIDE 0.9 % IJ SOLN
3.0000 mL | Freq: Two times a day (BID) | INTRAMUSCULAR | Status: DC
  Administered 2014-01-09 – 2014-01-10 (×4): 3 mL via INTRAVENOUS

## 2014-01-09 MED ORDER — ONDANSETRON HCL 4 MG PO TABS: 4.0000 mg | ORAL_TABLET | Freq: Four times a day (QID) | ORAL | Status: DC | PRN

## 2014-01-09 MED ORDER — ACETAMINOPHEN 650 MG RE SUPP: 650.0000 mg | Freq: Four times a day (QID) | RECTAL | Status: DC | PRN

## 2014-01-09 MED ORDER — DIPHENHYDRAMINE HCL 50 MG PO CAPS
50.0000 mg | ORAL_CAPSULE | Freq: Once | ORAL | Status: DC
  Filled 2014-01-09: qty 2

## 2014-01-09 MED ORDER — MORPHINE SULFATE 4 MG/ML IJ SOLN: 4.0000 mg | INTRAMUSCULAR | Status: DC | PRN

## 2014-01-09 MED ORDER — PIPERACILLIN-TAZOBACTAM 3.375 G IVPB 30 MIN: 3.3750 g | Freq: Once | INTRAVENOUS | Status: DC

## 2014-01-09 MED ORDER — PIPERACILLIN-TAZOBACTAM 3.375 G IVPB
3.3750 g | Freq: Three times a day (TID) | INTRAVENOUS | Status: DC
  Administered 2014-01-09 – 2014-01-11 (×6): 3.375 g via INTRAVENOUS
  Filled 2014-01-09 (×7): qty 50

## 2014-01-09 MED ORDER — ALPRAZOLAM 0.5 MG PO TABS
0.5000 mg | ORAL_TABLET | Freq: Two times a day (BID) | ORAL | Status: DC | PRN
  Administered 2014-01-09 – 2014-01-10 (×3): 0.5 mg via ORAL
  Filled 2014-01-09 (×3): qty 1

## 2014-01-09 MED ORDER — OXYCODONE-ACETAMINOPHEN 5-325 MG PO TABS
1.0000 | ORAL_TABLET | ORAL | Status: DC | PRN
  Administered 2014-01-09 – 2014-01-10 (×5): 1 via ORAL
  Filled 2014-01-09 (×5): qty 1

## 2014-01-09 MED ORDER — LEVALBUTEROL HCL 0.63 MG/3ML IN NEBU: 0.6300 mg | INHALATION_SOLUTION | Freq: Four times a day (QID) | RESPIRATORY_TRACT | Status: DC | PRN

## 2014-01-09 MED ORDER — VANCOMYCIN HCL IN DEXTROSE 1-5 GM/200ML-% IV SOLN
1000.0000 mg | Freq: Two times a day (BID) | INTRAVENOUS | Status: DC
  Administered 2014-01-09 – 2014-01-11 (×4): 1000 mg via INTRAVENOUS
  Filled 2014-01-09 (×5): qty 200

## 2014-01-09 MED ORDER — ENOXAPARIN SODIUM 40 MG/0.4ML ~~LOC~~ SOLN
40.0000 mg | SUBCUTANEOUS | Status: DC
  Administered 2014-01-09: 40 mg via SUBCUTANEOUS
  Filled 2014-01-09 (×3): qty 0.4

## 2014-01-09 MED ORDER — VANCOMYCIN HCL IN DEXTROSE 1-5 GM/200ML-% IV SOLN
1000.0000 mg | Freq: Once | INTRAVENOUS | Status: AC
  Administered 2014-01-09: 1000 mg via INTRAVENOUS
  Filled 2014-01-09: qty 200

## 2014-01-09 MED ORDER — SODIUM CHLORIDE 0.9 % IV SOLN
INTRAVENOUS | Status: DC
  Administered 2014-01-09: 125 mL/h via INTRAVENOUS
  Administered 2014-01-10 – 2014-01-11 (×4): via INTRAVENOUS

## 2014-01-09 MED ORDER — DOCUSATE SODIUM 100 MG PO CAPS
100.0000 mg | ORAL_CAPSULE | Freq: Two times a day (BID) | ORAL | Status: DC
  Administered 2014-01-10: 100 mg via ORAL
  Filled 2014-01-09 (×6): qty 1

## 2014-01-09 MED ORDER — FENTANYL 25 MCG/HR TD PT72
25.0000 ug | MEDICATED_PATCH | TRANSDERMAL | Status: DC
  Administered 2014-01-09: 25 ug via TRANSDERMAL
  Filled 2014-01-09: qty 1

## 2014-01-09 MED ORDER — HYDROMORPHONE HCL 1 MG/ML IJ SOLN
1.0000 mg | INTRAMUSCULAR | Status: DC | PRN
  Administered 2014-01-09 – 2014-01-10 (×4): 1 mg via INTRAVENOUS
  Filled 2014-01-09 (×4): qty 1

## 2014-01-09 MED ORDER — ONDANSETRON HCL 4 MG/2ML IJ SOLN: 4.0000 mg | Freq: Four times a day (QID) | INTRAMUSCULAR | Status: DC | PRN

## 2014-01-09 MED ORDER — AMPHETAMINE-DEXTROAMPHETAMINE 10 MG PO TABS
30.0000 mg | ORAL_TABLET | Freq: Three times a day (TID) | ORAL | Status: DC
Start: 2014-01-09 — End: 2014-01-11
  Administered 2014-01-09 – 2014-01-11 (×6): 30 mg via ORAL
  Filled 2014-01-09 (×12): qty 3

## 2014-01-09 MED ORDER — ZOLPIDEM TARTRATE 5 MG PO TABS
10.0000 mg | ORAL_TABLET | Freq: Every evening | ORAL | Status: DC | PRN
  Administered 2014-01-09 – 2014-01-10 (×2): 10 mg via ORAL
  Filled 2014-01-09 (×2): qty 2

## 2014-01-09 NOTE — ED Notes (Signed)
Pt c/o generalized itching post Vancomycin admin. MD notified.

## 2014-01-09 NOTE — ED Notes (Signed)
Patient being transported upstairs by Amy, NT

## 2014-01-09 NOTE — ED Provider Notes (Signed)
CSN: [id number]     Arrival date & time 01/09/14  7096 History   First MD Initiated Contact with Patient 01/09/14 [phone]     Chief Complaint  Patient presents with  . Wound Infection     HPI Pt was seen at 0950. Per pt, c/o gradual onset and worsening of persistent right breast "redness" and "swelliing" since early September (approximately 1 month ago). Pt states her symptoms began 1 month after she had her breast implants removed. Pt states she has been evaluated by her Plastic Surgeon, as well as the ED for same. Pt's most recent evaluation was by her Plastic Surgeon 3 days ago: right inferior breast was I&D, with expression of "clear" fluid, penrose drain placed, and rx keflex was started. Pt states she "already was on amoxicillin and clindamycin" over the past month. Endorses home fevers to "102." Pt states she was evaluated by her Pain Management doctor this morning and was told to come to the ED for "admission for IV antibiotics." Pt denies injury, no drainage.    Past Medical History  Diagnosis Date  . Fibromyalgia   . 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 Arthritis   . Rheumatoid arthritis(714.0)     autoimmune arthritis; methotrexate once/week  . Depression     takes meds daily  . Post-nasal drip     hx of  . Other specified rheumatoid arthritis, right shoulder 08/01/2011  . Asthma   . Allergy   . Chronic kidney disease   . Urinary incontinence   . Family history of malignant neoplasm of breast   . Chronic pain    Past Surgical History  Procedure Laterality Date  . Shoulder surgery  2012    right  . Back surgery  2011    lower back fusion l4-5, s1  . Bladder surgery  2009  . Tubal ligation  1988  . Breast enhancement surgery  2003  . Liposuction  2003    abdominal  . Tonsillectomy  1987  . Total shoulder arthroplasty  08/01/2011    Procedure: TOTAL SHOULDER ARTHROPLASTY;  Surgeon: Eulas Post, MD;  Location[facility] OR;  Service:  Orthopedics;  Laterality: Right;  Right total shoulder arthroplasty  . Mass excision  11/03/2011    Procedure: MINOR EXCISION OF MASS;  Surgeon: Wyn Forster., MD;  Location: [facility];  Service: Orthopedics;  Laterality: Left;  debride IP joint, cyst excision left index   Family History  Problem Relation Age of Onset  . Arthritis Mother   . Heart disease Mother     ?psvt  . Breast cancer Mother 33    TAH/BSO  . COPD Father   . Hypertension Father   . Breast cancer Maternal Aunt 27    deceased  . Cancer Cousin 53    female; unknown primary  . Colon cancer Paternal Aunt 49    deceased at 28  . Stomach cancer Paternal Uncle 72    deceased at 87   History  Substance Use Topics  . Smoking status: Never Smoker   . Smokeless tobacco: Not on file  . Alcohol Use: No    Review of Systems ROS: Statement: All systems negative except as marked or noted in the HPI; Constitutional: +fever and chills. ; ; Eyes: Negative for eye pain, redness and discharge. ; ; ENMT: Negative for ear pain, hoarseness, nasal congestion, sinus pressure and sore throat. ; ; Cardiovascular: Negative  for chest pain, palpitations, diaphoresis, dyspnea and peripheral edema. ; ; Respiratory: Negative for cough, wheezing and stridor. ; ; Gastrointestinal: Negative for nausea, vomiting, diarrhea, abdominal pain, blood in stool, hematemesis, jaundice and rectal bleeding.; ; Genitourinary: Negative for dysuria, flank pain and hematuria. ; ; Musculoskeletal: Negative for back pain and neck pain. Negative for trauma.; ; Skin: +right breast redness, swelling, warmth. Negative for pruritus, abrasions, blisters, bruising and skin lesion.; ; Neuro: Negative for headache, lightheadedness and neck stiffness. Negative for weakness, altered level of consciousness , altered mental status, extremity weakness, paresthesias, involuntary movement, seizure and syncope.      Allergies  Aspirin; Ibuprofen; Ativan; and  Tramadol hcl  Home Medications   Prior to Admission medications   Medication Sig Start Date End Date Taking? Authorizing Provider  Adalimumab (HUMIRA) 40 MG/0.8ML PSKT Inject 40 mg into the skin every 14 (fourteen) days.    Historical Provider, MD  ALPRAZolam Prudy Feeler) 0.5 MG tablet Take 0.5 mg by mouth 2 (two) times daily as needed. 12/17/13   Historical Provider, MD  amoxicillin (AMOXIL) 500 MG capsule Take 4 tablets one hour prior to procedure. 12/18/13   [name], MD  amphetamine-dextroamphetamine (ADDERALL) 30 MG tablet Take 30 mg by mouth 3 (three) times daily. 01/15/13   Historical Provider, MD  clindamycin (CLEOCIN) 300 MG capsule Take 1 capsule (300 mg total) by mouth 4 (four) times daily. X 7 days 01/04/14   Kathrynn Speed, PA-C  fentaNYL (DURAGESIC - DOSED MCG/HR) 25 MCG/HR patch Place 25 mcg onto the skin every 3 (three) days.  07/28/13   Historical Provider, MD  meloxicam (MOBIC) 15 MG tablet Take 15 mg by mouth daily.  04/23/12   Historical Provider, MD  oxyCODONE-acetaminophen (PERCOCET/ROXICET) 5-325 MG per tablet Take 1 tablet by mouth every 4 (four) hours as needed for severe pain. With food 04/07/13   Mellody Drown, PA-C  tretinoin (RETIN-A) 0.1 % cream Apply 1 application topically daily as needed (for acne). 08/04/13   [name], MD  zolpidem (AMBIEN) 10 MG tablet Take 10 mg by mouth at bedtime as needed for sleep.     Historical Provider, MD   BP 121/77  Pulse 84  Temp(Src) 98 F (36.7 C) (Oral)  Resp 14  Ht 5\' 4"  (1.626 m)  Wt 145 lb (65.772 kg)  BMI 24.88 kg/m2  SpO2 100% Physical Exam 0955: Physical examination:  Nursing notes reviewed; Vital signs and O2 SAT reviewed;  Constitutional: Well developed, Well nourished, Well hydrated, In no acute distress; Head:  Normocephalic, atraumatic; Eyes: EOMI, PERRL, No scleral icterus; ENMT: Mouth and pharynx normal, Mucous membranes moist; Neck: Supple, Full range of motion, No lymphadenopathy; Cardiovascular: Regular  rate and rhythm, No gallop; Respiratory: Breath sounds clear & equal bilaterally, No wheezes.  Speaking full sentences with ease, Normal respiratory effort/excursion; Chest: +right inferior breast with erythema, induration, warmth and tenderness to palp. +right inferior lateral breast with penrose drain, no drainage, no ecchymosis. +inferior medial side of right breast has a palp firm area of approx 4cm in diameter. No soft tissue crepitus, no deformity. Movement normal; Abdomen: Soft, Nontender, Nondistended, Normal bowel sounds; Genitourinary: No CVA tenderness; Extremities: Pulses normal, No tenderness, No edema, No calf edema or asymmetry.; Neuro: AA&Ox3, Major CN grossly intact.  Speech clear. No gross focal motor or sensory deficits in extremities.; Skin: Color normal, Warm, Dry.    ED Course  Procedures    EKG Interpretation None      MDM  MDM  Reviewed: previous chart, nursing note and vitals Reviewed previous: labs Interpretation: labs and ultrasound     Results for orders placed during the hospital encounter of 01/09/14  CBC WITH DIFFERENTIAL      Result Value Ref Range   WBC 15.7 (*) 4.0 - 10.5 K/uL   RBC 4.46  3.87 - 5.11 MIL/uL   Hemoglobin 12.2  12.0 - 15.0 g/dL   HCT 50.5  39.7 - 67.3 %   MCV 84.3  78.0 - 100.0 fL   MCH 27.4  26.0 - 34.0 pg   MCHC 32.4  30.0 - 36.0 g/dL   RDW 41.9  37.9 - 02.4 %   Platelets 518 (*) 150 - 400 K/uL   Neutrophils Relative % 76  43 - 77 %   Neutro Abs 11.9 (*) 1.7 - 7.7 K/uL   Lymphocytes Relative 17  12 - 46 %   Lymphs Abs 2.6  0.7 - 4.0 K/uL   Monocytes Relative 7  3 - 12 %   Monocytes Absolute 1.2 (*) 0.1 - 1.0 K/uL   Eosinophils Relative 0  0 - 5 %   Eosinophils Absolute 0.0  0.0 - 0.7 K/uL   Basophils Relative 0  0 - 1 %   Basophils Absolute 0.0  0.0 - 0.1 K/uL  BASIC METABOLIC PANEL      Result Value Ref Range   Sodium 138  137 - 147 mEq/L   Potassium 4.5  3.7 - 5.3 mEq/L   Chloride 99  96 - 112 mEq/L   CO2 26  19 - 32  mEq/L   Glucose, Bld 107 (*) 70 - 99 mg/dL   BUN 18  6 - 23 mg/dL   Creatinine, Ser 0.97  0.50 - 1.10 mg/dL   Calcium 35.3  8.4 - 29.9 mg/dL   GFR calc non Af Amer >90  >90 mL/min   GFR calc Af Amer >90  >90 mL/min   Anion gap 13  5 - 15  LACTIC ACID, PLASMA      Result Value Ref Range   Lactic Acid, Venous 1.5  0.5 - 2.2 mmol/L   US Breast Ltd Uni Right Inc Axilla 01/09/2014   CLINICAL DATA:  Painful mass in the RIGHT breast.  EXAM: ULTRASOUND OF THE RIGHT BREAST  COMPARISON:  None.  FINDINGS: Ultrasound is performed, showing focused ultrasound scanning over the area palpable abnormality demonstrates heterogeneous hypoechoic region that probably represents a collapsed seroma are hematoma. Penrose drain is present. There is no drainable fluid collection or abscess.  IMPRESSION: Probable abnormality corresponds with what appears to be a collapsed cavity with Penrose drain.   Electronically Signed   By: Andreas Newport M.D.   On: 01/09/2014 11:26    1145:  WBC elevated today compared to previous. No abscess on [place], will dose IV vancomycin. Dx and testing d/w pt.  Questions answered.  Verb understanding, agreeable to observation admit.  T/C to [facility] Dr. Susie Cassette, case discussed, including:  HPI, pertinent PM/SHx, VS/PE, dx testing, ED course and treatment:  Agreeable to admit, requests to write temporary orders, obtain tele bed to team [id number].   Samuel Jester, DO 01/09/14 2040

## 2014-01-09 NOTE — ED Notes (Signed)
Pt sts she had breast implants removed the beginning of August. September pt sts R breast started getting red and she started antibiotics w/o any progress and pt was started on a new antibiotic Tuesday and patient sts she feels like it is getting worse. Redness and swelling has continued to increase. Wound is warm to touch. Pt noted to have penrose drain. Pain 8/10.

## 2014-01-09 NOTE — Progress Notes (Signed)
Hedwig Mcfall [id number] Admission Data: 01/09/2014 4:36 PM Attending Provider: Richarda Overlie, MD  FXJ:[id number],MEBRA W, MD Consults/ Treatment Team: Treatment Team:  Louisa Second, MD  Kara [name] is a 58 y.o. female patient admitted from ED awake, alert  & orientated  X 3,  Full Code, VSS - Blood pressure 121/81, pulse 86, temperature 98.7 F (37.1 C), temperature source Oral, resp. rate 20, height 5\' 4"  (1.626 m), weight 68.811 kg (151 lb 11.2 oz), SpO2 98.00%., no c/o shortness of breath, no c/o chest pain, no distress noted. Tele # 17 placed and pt is currently running:NSR   IV site WDL:  Left A/C running Normal Saline.  Allergies:   Allergies  Allergen Reactions  . Aspirin Anaphylaxis  . Ibuprofen Anaphylaxis  . Ativan [Lorazepam] Other (See Comments)    Makes agitated, combative   . Tramadol Hcl Itching and Rash     Past Medical History  Diagnosis Date  . Fibromyalgia   . 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 Arthritis   . Rheumatoid arthritis(714.0)     autoimmune arthritis; methotrexate once/week  . Depression     takes meds daily  . Post-nasal drip     hx of  . Other specified rheumatoid arthritis, right shoulder 08/01/2011  . Asthma   . Allergy   . Chronic kidney disease   . Urinary incontinence   . Family history of malignant neoplasm of breast   . Chronic pain    Pt orientation to unit, room and routine. Information packet given to patient/family and safety video watched.  Admission INP armband ID verified with patient/family, and in place. SR up x 2, fall risk assessment complete with Patient and family verbalizing understanding of risks associated with falls. Pt verbalizes an understanding of how to use the call bell and to call for help before getting out of bed.  Skin, clean-dry- intact without evidence of bruising, or skin tears.   No evidence of skin break down noted on exam. However, Incision area noted to Right Breast.       Will cont to monitor and assist as needed.  10/01/2011, RN 01/09/2014 4:36 PM

## 2014-01-09 NOTE — H&P (Addendum)
[facility] History and Physical  Kara [name] RJJ:[id number] DOB: 1956-03-07 DOA: 01/09/2014  Referring physician:  PCP: [name], MD   Chief Complaint: cellulitis  right breast  HPI:  58 y/o female with a PMHx of fibromyalgia, RA, depression and asthma who presents to the ED with concerns of a worsening right breast infection x 1 week. She recently had her breast implants removed by Dr. [name], and she noticed some redness after the surgery 1 week ago, was started on amoxicillin, however the infection appears to be worse. States her breast is painful and feels warm. Denies fevers. She reports when she has an infection, it triggers her chronic pain, RA pain and fibromyalgia. She has a contract with the pain clinic and is on percocet, however it does not help. States she hates constantly having pain and it makes her feel depressed. She has a Veterinary surgeon and psychiatrist, and works as part of an ACT team, however reports there is only so much medication that can help. Denies suicidal or homicidal ideations. She was in the ED 10/11, received IV clindamycin, she was not admitted because she was afebrile and had a normal white count, subsequently discharged on by mouth clindamycin. She is afebrile today, white count is 15.7, ultrasound of the right breast a Penrose drain but no abscess   Review of Systems: negative for the following  Constitutional: Denies fever, chills, diaphoresis, appetite change and fatigue.  HEENT: Denies photophobia, eye pain, redness, hearing loss, ear pain, congestion, sore throat, rhinorrhea, sneezing, mouth sores, trouble swallowing, neck pain, neck stiffness and tinnitus.  Respiratory: Denies SOB, DOE, cough, chest tightness, and wheezing.  Cardiovascular: Denies chest pain, palpitations and leg swelling.  Gastrointestinal: Denies nausea, vomiting, abdominal pain, diarrhea, constipation, blood in stool and abdominal distention.  Genitourinary: Denies  dysuria, urgency, frequency, hematuria, flank pain and difficulty urinating.  Musculoskeletal: Positive for arthralgias, arthritis and myalgias.  Skin: Cellulitis of the right breast Neurological: Denies dizziness, seizures, syncope, weakness, light-headedness, numbness and headaches.  Hematological: Denies adenopathy. Easy bruising, personal or family bleeding history  Psychiatric/Behavioral: Denies suicidal ideation, mood changes, confusion, nervousness, sleep disturbance and agitation       Past Medical History  Diagnosis Date  . Fibromyalgia   . 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 Arthritis   . Rheumatoid arthritis(714.0)     autoimmune arthritis; methotrexate once/week  . Depression     takes meds daily  . Post-nasal drip     hx of  . Other specified rheumatoid arthritis, right shoulder 08/01/2011  . Asthma   . Allergy   . Chronic kidney disease   . Urinary incontinence   . Family history of malignant neoplasm of breast   . Chronic pain      Past Surgical History  Procedure Laterality Date  . Shoulder surgery  2012    right  . Back surgery  2011    lower back fusion l4-5, s1  . Bladder surgery  2009  . Tubal ligation  1988  . Breast enhancement surgery  2003  . Liposuction  2003    abdominal  . Tonsillectomy  1987  . Total shoulder arthroplasty  08/01/2011    Procedure: TOTAL SHOULDER ARTHROPLASTY;  Surgeon: Eulas Post, MD;  Location: [facility] OR;  Service: Orthopedics;  Laterality: Right;  Right total shoulder arthroplasty  . Mass excision  11/03/2011    Procedure: MINOR EXCISION OF MASS;  Surgeon: Wyn Forster.,  MD;  Location:  SURGERY CENTER;  Service: Orthopedics;  Laterality: Left;  debride IP joint, cyst excision left index      Social History:  reports that she has never smoked. She does not have any smokeless tobacco history on file. She reports that she uses illicit drugs (Marijuana). She reports that she  does not drink alcohol.    Allergies  Allergen Reactions  . Aspirin Anaphylaxis  . Ibuprofen Anaphylaxis  . Ativan [Lorazepam] Other (See Comments)    Makes agitated, combative   . Tramadol Hcl Itching and Rash    Family History  Problem Relation Age of Onset  . Arthritis Mother   . Heart disease Mother     ?psvt  . Breast cancer Mother 48    TAH/BSO  . COPD Father   . Hypertension Father   . Breast cancer Maternal Aunt 27    deceased  . Cancer Cousin 71    female; unknown primary  . Colon cancer Paternal Aunt 39    deceased at 68  . Stomach cancer Paternal Uncle 36    deceased at 50     Prior to Admission medications   Medication Sig Start Date End Date Taking? Authorizing Provider  Adalimumab (HUMIRA) 40 MG/0.8ML PSKT Inject 40 mg into the skin every 14 (fourteen) days.    Historical Provider, MD  ALPRAZolam Prudy Feeler) 0.5 MG tablet Take 0.5 mg by mouth 2 (two) times daily as needed. 12/17/13   Historical Provider, MD  amphetamine-dextroamphetamine (ADDERALL) 30 MG tablet Take 30 mg by mouth 3 (three) times daily. 01/15/13   Historical Provider, MD  clindamycin (CLEOCIN) 300 MG capsule Take 1 capsule (300 mg total) by mouth 4 (four) times daily. X 7 days 01/04/14   Kathrynn Speed, PA-C  fentaNYL (DURAGESIC - DOSED MCG/HR) 25 MCG/HR patch Place 25 mcg onto the skin every 3 (three) days.  07/28/13   Historical Provider, MD  meloxicam (MOBIC) 15 MG tablet Take 15 mg by mouth daily.  04/23/12   Historical Provider, MD  oxyCODONE-acetaminophen (PERCOCET/ROXICET) 5-325 MG per tablet Take 1 tablet by mouth every 4 (four) hours as needed for severe pain. With food 04/07/13   Mellody Drown, PA-C  tretinoin (RETIN-A) 0.1 % cream Apply 1 application topically daily as needed (for acne). 08/04/13   [name], MD  zolpidem (AMBIEN) 10 MG tablet Take 10 mg by mouth at bedtime as needed for sleep.     Historical Provider, MD     Physical Exam: Filed Vitals:   01/09/14 0937 01/09/14  1015  BP: 121/77 125/83  Pulse: 84 82  Temp: 98 F (36.7 C)   TempSrc: Oral   Resp: 14   Height: 5\' 4"  (1.626 m)   Weight: 65.772 kg (145 lb)   SpO2: 100% 97%     Constitutional: Vital signs reviewed. Patient is a well-developed and well-nourished in no acute distress and cooperative with exam. Alert and oriented x3.  Head: Normocephalic and atraumatic  Ear: TM normal bilaterally  Mouth: no erythema or exudates, MMM  Eyes: PERRL, EOMI, conjunctivae normal, No scleral icterus.  Neck: Supple, Trachea midline normal ROM, No JVD, mass, thyromegaly, or carotid bruit present.  Cardiovascular: RRR, S1 normal, S2 normal, no MRG, pulses symmetric and intact bilaterally  Pulmonary/Chest: CTAB, no wheezes, rales, or rhonchi  Abdominal: Soft. Non-tender, non-distended, bowel sounds are normal, no masses, organomegaly, or guarding present.  GU: no CVA tenderness Musculoskeletal: No joint deformities, erythema, or stiffness, ROM full and  no nontender Ext: no edema and no cyanosis, pulses palpable bilaterally (DP and PT)  Hematology: no cervical, inginal, or axillary adenopathy.  Neurological: A&O x3, Strenght is normal and symmetric bilaterally, cranial nerve II-XII are grossly intact, no focal motor deficit, sensory intact to light touch bilaterally.  Skin: Erythema and warmth, no fluctuance of the right breast  Psychiatric:Psychiatric: Her behavior is normal. Her mood appears anxious. She exhibits a depressed mood.        Labs on Admission:    Basic Metabolic Panel:  Recent Labs Lab 01/04/14 1251 01/09/14 1020  NA 139 138  K 3.6* 4.5  CL 100 99  CO2 27 26  GLUCOSE 121* 107*  BUN 13 18  CREATININE 0.60 0.54  CALCIUM 9.4 10.3   Liver Function Tests:  Recent Labs Lab 01/04/14 1251  AST 14  ALT 19  ALKPHOS 228*  BILITOT <0.2*  PROT 7.6  ALBUMIN 3.3*   No results found for this basename: LIPASE, AMYLASE,  in the last 168 hours No results found for this basename:  AMMONIA,  in the last 168 hours CBC:  Recent Labs Lab 01/04/14 1251 01/09/14 1020  WBC 7.4 15.7*  NEUTROABS 4.1 11.9*  HGB 11.8* 12.2  HCT 35.7* 37.6  MCV 82.8 84.3  PLT 394 518*   Cardiac Enzymes: No results found for this basename: CKTOTAL, CKMB, CKMBINDEX, TROPONINI,  in the last 168 hours  BNP (last 3 results) No results found for this basename: PROBNP,  in the last 8760 hours    CBG: No results found for this basename: GLUCAP,  in the last 168 hours  Radiological Exams on Admission: US Breast Ltd Uni Right Inc Axilla  01/09/2014   CLINICAL DATA:  Painful mass in the RIGHT breast.  EXAM: ULTRASOUND OF THE RIGHT BREAST  COMPARISON:  None.  FINDINGS: Ultrasound is performed, showing focused ultrasound scanning over the area palpable abnormality demonstrates heterogeneous hypoechoic region that probably represents a collapsed seroma are hematoma. Penrose drain is present. There is no drainable fluid collection or abscess.  IMPRESSION: Probable abnormality corresponds with what appears to be a collapsed cavity with Penrose drain.   Electronically Signed   By: Andreas Newport M.D.   On: 01/09/2014 11:26    EKG: Independently reviewed. *   Assessment/Plan Active Problems:   Cellulitis of breast  Cellulitis of the breast No evidence of abscess  patient with Penrose drain in place we'll start the patient on vancomycin and Zosyn, as already been on amoxicillin and clindamycin Surgical consultation Blood culture x2 Discussed with  Dr [name] ,last seen in the office 10/15, had a right breast exploration and drained a seroma  on 10/14,  Had B/L breast lifts a month ago Call dr [name] office when patient gets to the floor    Fibromyalgia/ rheumatoid arthritis  Continue fentanyl patch, Percocet, patient was to pain clinic She spoke to her Rheumatologist DR [name] and he advised to hold humira   Depression- Continue Xanax  Asthma When necessary nebulizers  needed Obtain a chest x-ray given leukocytosis    Code Status:   full Family Communication: bedside Disposition Plan: admit   Time spent: 70 mins   [facility] Pager [phone]  If 7PM-7AM, please contact night-coverage [email or web address] Password [email or web address] 01/09/2014, 11:52 AM

## 2014-01-09 NOTE — Progress Notes (Signed)
ANTIBIOTIC CONSULT NOTE - INITIAL  Pharmacy Consult for vancomycin and zosyn Indication: cellulitis  Allergies  Allergen Reactions  . Aspirin Anaphylaxis  . Ibuprofen Anaphylaxis  . Ativan [Lorazepam] Other (See Comments)    Makes agitated, combative   . Tramadol Hcl Itching and Rash    Patient Measurements: Height: 5\' 4"  (162.6 cm) Weight: 145 lb (65.772 kg) IBW/kg (Calculated) : 54.7   Vital Signs: Temp: 98 F (36.7 C) (10/16 0937) Temp Source: Oral (10/16 0937) BP: 122/70 mmHg (10/16 1156) Pulse Rate: 80 (10/16 1156) Intake/Output from previous day:   Intake/Output from this shift:    Labs:  Recent Labs  01/09/14 1020  WBC 15.7*  HGB 12.2  PLT 518*  CREATININE 0.54   Estimated Creatinine Clearance: 72.4 ml/min (by C-G formula based on Cr of 0.54). No results found for this basename: VANCOTROUGH, VANCOPEAK, VANCORANDOM, GENTTROUGH, GENTPEAK, GENTRANDOM, TOBRATROUGH, TOBRAPEAK, TOBRARND, AMIKACINPEAK, AMIKACINTROU, AMIKACIN,  in the last 72 hours   Microbiology: No results found for this or any previous visit (from the past 720 hour(s)).  Medical History: Past Medical History  Diagnosis Date  . Fibromyalgia   . 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 Arthritis   . Rheumatoid arthritis(714.0)     autoimmune arthritis; methotrexate once/week  . Depression     takes meds daily  . Post-nasal drip     hx of  . Other specified rheumatoid arthritis, right shoulder 08/01/2011  . Asthma   . Allergy   . Chronic kidney disease   . Urinary incontinence   . Family history of malignant neoplasm of breast   . Chronic pain     Assessment: 58 yo F to start vancomycin and zosyn for cellulitis of R breast.  Pt s/p breast implant removal in August. Has Penrose drain but no abscess. PMH of fibromyalgia, RA and chronic pain.  AF, WBC 15.7, creat 0.54, wt 65.8 kg.   Clinda 600 mg in ED 10/11 vanc 1 gm in ED 10/16 at 1151  10/16  BCx2>>   Goal of Therapy:  Vancomycin trough level 10-15 mcg/ml  Plan:  -zosyn 3.375 gm IV x 1 dose over 30 minutes, then zosyn 3.375 gm IV q8h, infuse each dose over 4 hours -vancomycin 1 gm IV q12h -f/u renal fxn, wbc, temp, culture data -steady-state vancomycin trough as needed  11/16, Pharm.D. Herby Abraham 01/09/2014 12:06 PM

## 2014-01-10 ENCOUNTER — Inpatient Hospital Stay (HOSPITAL_COMMUNITY): Payer: Managed Care, Other (non HMO)

## 2014-01-10 LAB — COMPREHENSIVE METABOLIC PANEL
ALT: 12 U/L (ref 0–35)
AST: 13 U/L (ref 0–37)
Albumin: 2.9 g/dL — ABNORMAL LOW (ref 3.5–5.2)
Alkaline Phosphatase: 158 U/L — ABNORMAL HIGH (ref 39–117)
Anion gap: 12 (ref 5–15)
BUN: 15 mg/dL (ref 6–23)
CO2: 25 mEq/L (ref 19–32)
Calcium: 8.9 mg/dL (ref 8.4–10.5)
Chloride: 107 mEq/L (ref 96–112)
Creatinine, Ser: 0.69 mg/dL (ref 0.50–1.10)
GFR calc Af Amer: >90 mL/min (ref >90)
GFR calc non Af Amer: >90 mL/min (ref >90)
Glucose, Bld: 86 mg/dL (ref 70–99)
Potassium: 3.9 mEq/L (ref 3.7–5.3)
Sodium: 144 mEq/L (ref 137–147)
Total Bilirubin: <0.2 mg/dL — ABNORMAL LOW (ref 0.3–1.2)
Total Protein: 6.9 g/dL (ref 6.0–8.3)

## 2014-01-10 LAB — CBC
HCT: 33.7 % — ABNORMAL LOW (ref 36.0–46.0)
Hemoglobin: 11.1 g/dL — ABNORMAL LOW (ref 12.0–15.0)
MCH: 27.4 pg (ref 26.0–34.0)
MCHC: 32.9 g/dL (ref 30.0–36.0)
MCV: 83.2 fL (ref 78.0–100.0)
Platelets: 445 10*3/uL — ABNORMAL HIGH (ref 150–400)
RBC: 4.05 MIL/uL (ref 3.87–5.11)
RDW: 13.1 % (ref 11.5–15.5)
WBC: 11.2 10*3/uL — ABNORMAL HIGH (ref 4.0–10.5)

## 2014-01-10 MED ORDER — OXYCODONE-ACETAMINOPHEN 10-325 MG PO TABS: 1.0000 | ORAL_TABLET | ORAL | Status: DC | PRN

## 2014-01-10 MED ORDER — OXYCODONE-ACETAMINOPHEN 5-325 MG PO TABS
1.0000 | ORAL_TABLET | ORAL | Status: DC | PRN
  Administered 2014-01-10 – 2014-01-11 (×3): 2 via ORAL
  Filled 2014-01-10 (×3): qty 2

## 2014-01-10 MED ORDER — KETOROLAC TROMETHAMINE 15 MG/ML IJ SOLN: 15.0000 mg | Freq: Four times a day (QID) | INTRAMUSCULAR | Status: DC | PRN

## 2014-01-10 MED ORDER — OXYCODONE HCL 5 MG PO TABS
5.0000 mg | ORAL_TABLET | ORAL | Status: DC | PRN
  Administered 2014-01-11: 5 mg via ORAL
  Filled 2014-01-10: qty 1

## 2014-01-10 MED ORDER — FENTANYL 50 MCG/HR TD PT72
50.0000 ug | MEDICATED_PATCH | TRANSDERMAL | Status: DC
  Administered 2014-01-10: 50 ug via TRANSDERMAL
  Filled 2014-01-10: qty 1

## 2014-01-10 MED ORDER — HYDROMORPHONE HCL 2 MG PO TABS: 2.0000 mg | ORAL_TABLET | ORAL | Status: DC | PRN

## 2014-01-10 MED ORDER — DIAZEPAM 5 MG PO TABS
5.0000 mg | ORAL_TABLET | Freq: Two times a day (BID) | ORAL | Status: DC | PRN
  Administered 2014-01-10: 5 mg via ORAL
  Filled 2014-01-10: qty 1

## 2014-01-10 NOTE — Progress Notes (Signed)
New Fentanyl patch 50 mcg applied in RUA, covered with transparent tegaderm. Removed old F Patch 25 mcg from [place] and wasted in sharps, witnessed by Frederich Cha, RN.

## 2014-01-10 NOTE — Progress Notes (Signed)
Notified Dr. Susie Cassette of new PTA med list and I asked for her to speak with patient.

## 2014-01-10 NOTE — Progress Notes (Signed)
Paged Dr. Susie Cassette again about speaking with patient about prior to admission medications vs. Current medications. Pharm tech has updated list in computer with most recent information and a note from [facility] pharmacy.

## 2014-01-10 NOTE — Progress Notes (Signed)
[facility] PROGRESS NOTE  Kara [name] ZOX:[id number] DOB: 26-Nov-1955 DOA: 01/09/2014 PCP: Kara Covey, MD  Assessment/Plan: Active Problems:   Cellulitis of breast    Cellulitis of the breast -improving No evidence of abscess on ultrasound  patient with Penrose drain in place -Dr. [name] to remove it today Continue vancomycin and Zosyn, as already been on amoxicillin and clindamycin  Followup Blood culture x2  Discussed with Dr Kara [name] ,last seen in the office 10/15, had a right breast exploration and drained a seroma on 10/14,  Had B/L breast lifts a month ago     Fibromyalgia/ rheumatoid arthritis  Continue fentanyl patch, Percocet, patient was to pain clinic  She spoke to her Rheumatologist DR [name] and he advised to hold humira   Depression--anxiety  Continue Xanax  Patient requesting Valium in addition to Xanax  Asthma  When necessary nebulizers needed  Obtain a chest x-ray given leukocytosis  Stable     Code Status: full Family Communication: family updated about patient's clinical progress Disposition Plan:  Anticipate one to 2 more days in the hospital   Brief narrative: 58 y/o female with a PMHx of fibromyalgia, RA, depression and asthma who presents to the ED with concerns of a worsening right breast infection x 1 week. She recently had her breast implants removed by Dr. Aurelio [name], and she noticed some redness after the surgery 1 week ago, was started on amoxicillin, however the infection appears to be worse. States her breast is painful and feels warm. Denies fevers. She reports when she has an infection, it triggers her chronic pain, RA pain and fibromyalgia. She has a contract with the pain clinic and is on percocet, however it does not help. States she hates constantly having pain and it makes her feel depressed. She has a Veterinary surgeon and psychiatrist, and works as part of an ACT team, however reports there is only so much medication that can  help. Denies suicidal or homicidal ideations.  She was in the ED 10/11, received IV clindamycin, she was not admitted because she was afebrile and had a normal white count, subsequently discharged on by mouth clindamycin.  She is afebrile today, white count is 15.7, ultrasound of the right breast a Penrose drain but no abscess   Consultants:  Plastic surgery Dr. [name]  Procedures:  None  Antibiotics:  Vancomycin/Zosyn  HPI/Subjective: Symptomatically much improved, redness of the breast is significantly improved  Objective: Filed Vitals:   01/09/14 1415 01/09/14 1519 01/09/14 2134 01/10/14 0548  BP: 101/85 121/81 101/64 118/69  Pulse: 79 86 91 70  Temp:  98.7 F (37.1 C) 99.3 F (37.4 C) 98.9 F (37.2 C)  TempSrc:  Oral Oral Oral  Resp:  20 18 18   Height:  5\' 4"  (1.626 m)    Weight:  68.811 kg (151 lb 11.2 oz)    SpO2: 98% 98% 99% 98%    Intake/Output Summary (Last 24 hours) at 01/10/14 1128 Last data filed at 01/10/14 01/12/14  Gross per 24 hour  Intake 2720.08 ml  Output      0 ml  Net 2720.08 ml    Exam:  General: alert & oriented x 3 In NAD  Cardiovascular: RRR, nl S1 s2  Respiratory: Decreased breath sounds at the bases, scattered rhonchi, no crackles , redness of the breast area, erythema and duration is significantly improved Abdomen: soft +BS NT/ND, no masses palpable  Extremities: No cyanosis and no edema      Data Reviewed: Basic Metabolic Panel:  Recent Labs Lab 01/04/14 1251 01/09/14 1020 01/10/14 0506  NA 139 138 144  K 3.6* 4.5 3.9  CL 100 99 107  CO2 27 26 25   GLUCOSE 121* 107* 86  BUN 13 18 15   CREATININE 0.60 0.54 0.69  CALCIUM 9.4 10.3 8.9    Liver Function Tests:  Recent Labs Lab 01/04/14 1251 01/09/14 1150 01/10/14 0506  AST 14 17 13   ALT 19 13 12   ALKPHOS 228* 205* 158*  BILITOT <0.2* <0.2* <0.2*  PROT 7.6 8.7* 6.9  ALBUMIN 3.3* 3.9 2.9*   No results found for this basename: LIPASE, AMYLASE,  in the last 168  hours No results found for this basename: AMMONIA,  in the last 168 hours  CBC:  Recent Labs Lab 01/04/14 1251 01/09/14 1020 01/10/14 0506  WBC 7.4 15.7* 11.2*  NEUTROABS 4.1 11.9*  --   HGB 11.8* 12.2 11.1*  HCT 35.7* 37.6 33.7*  MCV 82.8 84.3 83.2  PLT 394 518* 445*    Cardiac Enzymes:  Recent Labs Lab 01/09/14 2007  TROPONINI <0.30   BNP (last 3 results) No results found for this basename: PROBNP,  in the last 8760 hours   CBG: No results found for this basename: GLUCAP,  in the last 168 hours  No results found for this or any previous visit (from the past 240 hour(s)).   Studies: Dg Chest 2 View  01/10/2014   CLINICAL DATA:  Leukocytosis.  History of asthma  EXAM: CHEST  2 VIEW  COMPARISON:  02/11/2013  FINDINGS: The heart size and mediastinal contours are within normal limits. Both lungs are clear. Granuloma identified within the left midlung. Previous right shoulder arthroplasty.  IMPRESSION: No active cardiopulmonary disease.   Electronically Signed   By: 01/11/14 M.D.   On: 01/10/2014 10:50   01/12/2014 Breast Ltd Uni Right Inc Axilla  01/09/2014   CLINICAL DATA:  Painful mass in the RIGHT breast.  EXAM: ULTRASOUND OF THE RIGHT BREAST  COMPARISON:  None.  FINDINGS: Ultrasound is performed, showing focused ultrasound scanning over the area palpable abnormality demonstrates heterogeneous hypoechoic region that probably represents a collapsed seroma are hematoma. Penrose drain is present. There is no drainable fluid collection or abscess.  IMPRESSION: Probable abnormality corresponds with what appears to be a collapsed cavity with Penrose drain.   Electronically Signed   By: Signa Kell M.D.   On: 01/09/2014 11:26    Scheduled Meds: . amphetamine-dextroamphetamine  30 mg Oral TID  . diphenhydrAMINE  50 mg Oral Once  . docusate sodium  100 mg Oral BID  . enoxaparin (LOVENOX) injection  40 mg Subcutaneous Q24H  . fentaNYL  25 mcg Transdermal Q72H  .  piperacillin-tazobactam (ZOSYN)  IV  3.375 g Intravenous Q8H  . sodium chloride  3 mL Intravenous Q12H  . vancomycin  1,000 mg Intravenous Q12H   Continuous Infusions: . sodium chloride 125 mL/hr at 01/10/14 0827    Active Problems:   Cellulitis of breast    Time spent: 40 minutes   [facility]  [facility] Pager [phone]. If 7PM-7AM, please contact night-coverage at [email or web address], password [facility] 01/10/2014, 11:28 AM  LOS: 1 day

## 2014-01-11 MED ORDER — OXYCODONE-ACETAMINOPHEN 10-325 MG PO TABS: 1.0000 | ORAL_TABLET | Freq: Three times a day (TID) | ORAL | Status: DC | PRN

## 2014-01-11 MED ORDER — AMOXICILLIN-POT CLAVULANATE 875-125 MG PO TABS: 1.0000 | ORAL_TABLET | Freq: Two times a day (BID) | ORAL | Status: DC

## 2014-01-11 MED ORDER — DOXYCYCLINE HYCLATE 100 MG PO TABS: 100.0000 mg | ORAL_TABLET | Freq: Two times a day (BID) | ORAL | Status: DC

## 2014-01-11 NOTE — Progress Notes (Signed)
[name] to be D/C'd Home per MD order.  Discussed with the patient and all questions fully answered.    Medication List         ALPRAZolam 0.5 MG tablet  Commonly known as:  XANAX  Take 0.5 mg by mouth 2 (two) times daily as needed.     amoxicillin-clavulanate 875-125 MG per tablet  Commonly known as:  AUGMENTIN  Take 1 tablet by mouth 2 (two) times daily.     amphetamine-dextroamphetamine 30 MG tablet  Commonly known as:  ADDERALL  Take 30 mg by mouth 3 (three) times daily.     doxycycline 100 MG tablet  Commonly known as:  VIBRA-TABS  Take 1 tablet (100 mg total) by mouth 2 (two) times daily.     fentaNYL 50 MCG/HR  Commonly known as:  DURAGESIC - dosed mcg/hr  Place 50 mcg onto the skin every 3 (three) days.     HUMIRA 40 MG/0.8ML Pskt  Generic drug:  Adalimumab  Inject 40 mg into the skin every 14 (fourteen) days.     HYDROmorphone 2 MG tablet  Commonly known as:  DILAUDID  Take 2-4 mg by mouth every 4 (four) hours as needed for severe pain.     meloxicam 15 MG tablet  Commonly known as:  MOBIC  Take 15 mg by mouth daily.     oxyCODONE-acetaminophen 10-325 MG per tablet  Commonly known as:  PERCOCET  Take 1 tablet by mouth every 8 (eight) hours as needed for pain.     zolpidem 10 MG tablet  Commonly known as:  AMBIEN  Take 10 mg by mouth at bedtime as needed for sleep.        VVS, Dressing over penrose drain site clean dry and intact.  IV catheter discontinued intact. Site without signs and symptoms of complications. Dressing and pressure applied.  An After Visit Summary was printed and given to the patient.  D/c education completed with patient/family including follow up instructions, medication list, d/c activities limitations if indicated, with other d/c instructions as indicated by MD - patient able to verbalize understanding, all questions fully answered.   Patient instructed to return to ED, call 911, or call MD for any changes in condition.    Patient escorted via WC, and D/C home via private auto.  Sandrea Hughs, [place] M 01/11/2014 1:17 PM

## 2014-01-11 NOTE — Discharge Instructions (Signed)
She spoke to her Rheumatologist DR [name] and he advised to hold humira  Please resume humira  if okay with rheumatology

## 2014-01-11 NOTE — Discharge Summary (Signed)
Physician Discharge Summary  Kara [name] MRN: [id number] DOB/AGE: March 11, 1956 58 y.o.  PCP: Eulas Post, MD   Admit date: 01/09/2014 Discharge date: 01/11/2014  Discharge Diagnoses:     Cellulitis of breast Fibromyalgia Rheumatoid arthritis Depression Anxiety   Follow up recommendations  follow up with Dr. Towanda Malkin, plastic surgery  Followup with PCP in 5-7 days       Medication List         ALPRAZolam 0.5 MG tablet  Commonly known as:  XANAX  Take 0.5 mg by mouth 2 (two) times daily as needed.     amoxicillin-clavulanate 875-125 MG per tablet  Commonly known as:  AUGMENTIN  Take 1 tablet by mouth 2 (two) times daily.     amphetamine-dextroamphetamine 30 MG tablet  Commonly known as:  ADDERALL  Take 30 mg by mouth 3 (three) times daily.     doxycycline 100 MG tablet  Commonly known as:  VIBRA-TABS  Take 1 tablet (100 mg total) by mouth 2 (two) times daily.     fentaNYL 50 MCG/HR  Commonly known as:  DURAGESIC - dosed mcg/hr  Place 50 mcg onto the skin every 3 (three) days.     HUMIRA 40 MG/0.8ML Pskt  Generic drug:  Adalimumab  Inject 40 mg into the skin every 14 (fourteen) days.     HYDROmorphone 2 MG tablet  Commonly known as:  DILAUDID  Take 2-4 mg by mouth every 4 (four) hours as needed for severe pain.     meloxicam 15 MG tablet  Commonly known as:  MOBIC  Take 15 mg by mouth daily.     oxyCODONE-acetaminophen 10-325 MG per tablet  Commonly known as:  PERCOCET  Take 1 tablet by mouth every 8 (eight) hours as needed for pain.     zolpidem 10 MG tablet  Commonly known as:  AMBIEN  Take 10 mg by mouth at bedtime as needed for sleep.        Discharge Condition: Stable  Disposition: 01-Home or Self Care   Consults Dr. Towanda Malkin plastic surgery   Significant Diagnostic Studies: Dg Chest 2 View  01/10/2014   CLINICAL DATA:  Leukocytosis.  History of asthma  EXAM: CHEST  2 VIEW  COMPARISON:  02/11/2013  FINDINGS: The heart  size and mediastinal contours are within normal limits. Both lungs are clear. Granuloma identified within the left midlung. Previous right shoulder arthroplasty.  IMPRESSION: No active cardiopulmonary disease.   Electronically Signed   By: Kerby Moors M.D.   On: 01/10/2014 10:50   US Breast Ltd Uni Right Inc Axilla  01/09/2014   CLINICAL DATA:  Painful mass in the RIGHT breast.  EXAM: ULTRASOUND OF THE RIGHT BREAST  COMPARISON:  None.  FINDINGS: Ultrasound is performed, showing focused ultrasound scanning over the area palpable abnormality demonstrates heterogeneous hypoechoic region that probably represents a collapsed seroma are hematoma. Penrose drain is present. There is no drainable fluid collection or abscess.  IMPRESSION: Probable abnormality corresponds with what appears to be a collapsed cavity with Penrose drain.   Electronically Signed   By: Dereck Ligas M.D.   On: 01/09/2014 11:26       Microbiology: Recent Results (from the past 240 hour(s))  CULTURE, BLOOD (ROUTINE X 2)     Status: None   Collection Time    01/09/14 11:25 AM      Result Value Ref Range Status   Specimen Description BLOOD RIGHT ANTECUBITAL   Final   Special Requests BOTTLES DRAWN  AEROBIC AND ANAEROBIC 10ML   Final   Culture  Setup Time     Final   Value: 01/09/2014 17:19     Performed at [facility]   Culture     Final   Value:        BLOOD CULTURE RECEIVED NO GROWTH TO DATE CULTURE WILL BE HELD FOR 5 DAYS BEFORE ISSUING A FINAL NEGATIVE REPORT     Performed at [facility]   Report Status PENDING   Incomplete  CULTURE, BLOOD (ROUTINE X 2)     Status: None   Collection Time    01/09/14 11:35 AM      Result Value Ref Range Status   Specimen Description BLOOD RIGHT ANTECUBITAL   Final   Special Requests BOTTLES DRAWN AEROBIC AND ANAEROBIC 10ML   Final   Culture  Setup Time     Final   Value: 01/09/2014 17:18     Performed at [facility]   Culture     Final   Value:         BLOOD CULTURE RECEIVED NO GROWTH TO DATE CULTURE WILL BE HELD FOR 5 DAYS BEFORE ISSUING A FINAL NEGATIVE REPORT     Performed at [facility]   Report Status PENDING   Incomplete     Labs: Results for orders placed during the hospital encounter of 01/09/14 (from the past 48 hour(s))  CULTURE, BLOOD (ROUTINE X 2)     Status: None   Collection Time    01/09/14 11:35 AM      Result Value Ref Range   Specimen Description BLOOD RIGHT ANTECUBITAL     Special Requests BOTTLES DRAWN AEROBIC AND ANAEROBIC 10ML     Culture  Setup Time       Value: 01/09/2014 17:18     Performed at [facility]   Culture       Value:        BLOOD CULTURE RECEIVED NO GROWTH TO DATE CULTURE WILL BE HELD FOR 5 DAYS BEFORE ISSUING A FINAL NEGATIVE REPORT     Performed at [facility]   Report Status PENDING    HEPATIC FUNCTION PANEL     Status: Abnormal   Collection Time    01/09/14 11:50 AM      Result Value Ref Range   Total Protein 8.7 (*) 6.0 - 8.3 g/dL   Albumin 3.9  3.5 - 5.2 g/dL   AST 17  0 - 37 U/L   ALT 13  0 - 35 U/L   Alkaline Phosphatase 205 (*) 39 - 117 U/L   Total Bilirubin <0.2 (*) 0.3 - 1.2 mg/dL   Bilirubin, Direct <0.2  0.0 - 0.3 mg/dL   Indirect Bilirubin NOT CALCULATED  0.3 - 0.9 mg/dL  TSH     Status: None   Collection Time    01/09/14  8:07 PM      Result Value Ref Range   TSH 0.589  0.350 - 4.500 uIU/mL  TROPONIN I     Status: None   Collection Time    01/09/14  8:07 PM      Result Value Ref Range   Troponin I <0.30  <0.30 ng/mL   Comment:            Due to the release kinetics of cTnI,     a negative result within the first hours     of the onset of symptoms does not rule out  myocardial infarction with certainty.     If myocardial infarction is still suspected,     repeat the test at appropriate intervals.  COMPREHENSIVE METABOLIC PANEL     Status: Abnormal   Collection Time    01/10/14  5:06 AM      Result Value Ref Range   Sodium 144  137 -  147 mEq/L   Potassium 3.9  3.7 - 5.3 mEq/L   Chloride 107  96 - 112 mEq/L   CO2 25  19 - 32 mEq/L   Glucose, Bld 86  70 - 99 mg/dL   BUN 15  6 - 23 mg/dL   Creatinine, Ser 0.69  0.50 - 1.10 mg/dL   Calcium 8.9  8.4 - 10.5 mg/dL   Total Protein 6.9  6.0 - 8.3 g/dL   Albumin 2.9 (*) 3.5 - 5.2 g/dL   AST 13  0 - 37 U/L   ALT 12  0 - 35 U/L   Alkaline Phosphatase 158 (*) 39 - 117 U/L   Total Bilirubin <0.2 (*) 0.3 - 1.2 mg/dL   GFR calc non Af Amer >90  >90 mL/min   GFR calc Af Amer >90  >90 mL/min   Comment: (NOTE)     The eGFR has been calculated using the CKD EPI equation.     This calculation has not been validated in all clinical situations.     eGFR's persistently <90 mL/min signify possible Chronic Kidney     Disease.   Anion gap 12  5 - 15  CBC     Status: Abnormal   Collection Time    01/10/14  5:06 AM      Result Value Ref Range   WBC 11.2 (*) 4.0 - 10.5 K/uL   RBC 4.05  3.87 - 5.11 MIL/uL   Hemoglobin 11.1 (*) 12.0 - 15.0 g/dL   HCT 33.7 (*) 36.0 - 46.0 %   MCV 83.2  78.0 - 100.0 fL   MCH 27.4  26.0 - 34.0 pg   MCHC 32.9  30.0 - 36.0 g/dL   RDW 13.1  11.5 - 15.5 %   Platelets 445 (*) 150 - 400 K/uL     HPI 58 y/o female with a PMHx of fibromyalgia, RA, depression and asthma who presents to the ED with concerns of a worsening right breast infection x 1 week. She recently had her breast implants removed by Dr. Georgia Lopes, and she noticed some redness after the surgery 1 week ago, was started on amoxicillin, however the infection appears to be worse. States her breast is painful and feels warm. Denies fevers. She reports when she has an infection, it triggers her chronic pain, RA pain and fibromyalgia. She has a contract with the pain clinic and is on percocet, however it does not help. States she hates constantly having pain and it makes her feel depressed. She has a Social worker and psychiatrist, and works as part of an ACT team, however reports there is only so much  medication that can help. Denies suicidal or homicidal ideations.  She was in the ED 10/11, received IV clindamycin, she was not admitted because she was afebrile and had a normal white count, subsequently discharged on by mouth clindamycin.  She is afebrile today, white count is 15.7, ultrasound of the right breast a Penrose drain but no abscess  HOSPITAL COURSE:  Cellulitis of the breast -improving  No evidence of abscess on ultrasound  patient with Penrose drain in place -Dr. Towanda Malkin  removed it prior to discharge Treated with vancomycin and Zosyn, as already been on amoxicillin and clindamycin  Followup Blood culture x2, no growth so far  Discussed with Dr Georgia Lopes ,last seen in the office 10/15, had a right breast exploration and drained a seroma on 10/14,  Had B/L breast lifts a month ago  Subsequently switched to doxycycline and Augmentin for another 10 days  Fibromyalgia/ rheumatoid arthritis  Continue fentanyl patch, Percocet, Dilaudid Patient also on Valium and Xanax for anxiety Patient also preferred management and spine care clinic and sees Dr. Shanon Brow spivey Patient is on a strict narcotic regimen with them Requesting to get through today and requesting tablets of Percocet She will call the pain clinic tomorrow and request release of her prescription for this month She spoke to her Rheumatologist DR Derald Macleod and he advised to hold humira  Instructed to resume if okay with rheumatology  Depression--anxiety  Continue Xanax  Patient requesting Valium in addition to Xanax   Asthma  When necessary nebulizers needed  Obtain a chest x-ray given leukocytosis  Stable        Discharge Exam: Blood pressure 133/86, pulse 76, temperature 97.9 F (36.6 C), temperature source Oral, resp. rate 17, height '5\' 4"'  (1.626 m), weight 68.811 kg (151 lb 11.2 oz), SpO2 97.00%.         Discharge Instructions   Diet - low sodium heart healthy    Complete by:  As directed       Increase activity slowly    Complete by:  As directed              Signed: Candies Palm 01/11/2014, 11:33 AM

## 2014-01-13 ENCOUNTER — Telehealth: Payer: Self-pay | Admitting: Family Medicine

## 2014-01-13 NOTE — Telephone Encounter (Signed)
Pt would like another referral to a pain management clinic. She does not like [facility] or [facility].  Pt states they treat her badly and no respect.  Needs someone that will give her medicine and not a hard time.

## 2014-01-14 ENCOUNTER — Other Ambulatory Visit: Payer: Self-pay | Admitting: Family Medicine

## 2014-01-14 ENCOUNTER — Encounter (HOSPITAL_COMMUNITY): Payer: Self-pay | Admitting: Emergency Medicine

## 2014-01-14 ENCOUNTER — Inpatient Hospital Stay (HOSPITAL_COMMUNITY)
Admission: EM | Admit: 2014-01-14 | Discharge: 2014-01-20 | DRG: 581 | Disposition: A | Discharge status: Home Health | Payer: Managed Care, Other (non HMO) | Attending: Internal Medicine | Admitting: Internal Medicine

## 2014-01-14 ENCOUNTER — Encounter: Payer: Self-pay | Admitting: Family Medicine

## 2014-01-14 ENCOUNTER — Ambulatory Visit (INDEPENDENT_AMBULATORY_CARE_PROVIDER_SITE_OTHER): Payer: Managed Care, Other (non HMO) | Admitting: Family Medicine

## 2014-01-14 VITALS — BP 122/78 | HR 99 | Wt 146.0 lb

## 2014-01-14 DIAGNOSIS — J449 Chronic obstructive pulmonary disease, unspecified: Secondary | ICD-10-CM | POA: Diagnosis present

## 2014-01-14 DIAGNOSIS — N63 Unspecified lump in unspecified breast: Secondary | ICD-10-CM

## 2014-01-14 DIAGNOSIS — M06811 Other specified rheumatoid arthritis, right shoulder: Secondary | ICD-10-CM

## 2014-01-14 DIAGNOSIS — Z87442 Personal history of urinary calculi: Secondary | ICD-10-CM

## 2014-01-14 DIAGNOSIS — M069 Rheumatoid arthritis, unspecified: Secondary | ICD-10-CM

## 2014-01-14 DIAGNOSIS — Z79899 Other long term (current) drug therapy: Secondary | ICD-10-CM

## 2014-01-14 DIAGNOSIS — R112 Nausea with vomiting, unspecified: Secondary | ICD-10-CM

## 2014-01-14 DIAGNOSIS — M1990 Unspecified osteoarthritis, unspecified site: Secondary | ICD-10-CM | POA: Diagnosis present

## 2014-01-14 DIAGNOSIS — T814XXD Infection following a procedure, subsequent encounter: Secondary | ICD-10-CM

## 2014-01-14 DIAGNOSIS — N611 Abscess of the breast and nipple: Secondary | ICD-10-CM | POA: Diagnosis present

## 2014-01-14 DIAGNOSIS — IMO0001 Reserved for inherently not codable concepts without codable children: Secondary | ICD-10-CM

## 2014-01-14 DIAGNOSIS — F32A Depression, unspecified: Secondary | ICD-10-CM | POA: Diagnosis present

## 2014-01-14 DIAGNOSIS — N61 Inflammatory disorders of breast: Secondary | ICD-10-CM

## 2014-01-14 DIAGNOSIS — Z8709 Personal history of other diseases of the respiratory system: Secondary | ICD-10-CM

## 2014-01-14 DIAGNOSIS — L0390 Cellulitis, unspecified: Secondary | ICD-10-CM

## 2014-01-14 DIAGNOSIS — Z96611 Presence of right artificial shoulder joint: Secondary | ICD-10-CM | POA: Diagnosis present

## 2014-01-14 DIAGNOSIS — Z8249 Family history of ischemic heart disease and other diseases of the circulatory system: Secondary | ICD-10-CM

## 2014-01-14 DIAGNOSIS — M545 Low back pain, unspecified: Secondary | ICD-10-CM | POA: Diagnosis present

## 2014-01-14 DIAGNOSIS — Z825 Family history of asthma and other chronic lower respiratory diseases: Secondary | ICD-10-CM

## 2014-01-14 DIAGNOSIS — N189 Chronic kidney disease, unspecified: Secondary | ICD-10-CM | POA: Diagnosis present

## 2014-01-14 DIAGNOSIS — Z803 Family history of malignant neoplasm of breast: Secondary | ICD-10-CM

## 2014-01-14 DIAGNOSIS — F988 Other specified behavioral and emotional disorders with onset usually occurring in childhood and adolescence: Secondary | ICD-10-CM

## 2014-01-14 DIAGNOSIS — M797 Fibromyalgia: Secondary | ICD-10-CM

## 2014-01-14 DIAGNOSIS — Z8261 Family history of arthritis: Secondary | ICD-10-CM

## 2014-01-14 DIAGNOSIS — F329 Major depressive disorder, single episode, unspecified: Secondary | ICD-10-CM | POA: Diagnosis present

## 2014-01-14 DIAGNOSIS — Z886 Allergy status to analgesic agent status: Secondary | ICD-10-CM

## 2014-01-14 DIAGNOSIS — Z8619 Personal history of other infectious and parasitic diseases: Secondary | ICD-10-CM

## 2014-01-14 DIAGNOSIS — J45909 Unspecified asthma, uncomplicated: Secondary | ICD-10-CM | POA: Diagnosis present

## 2014-01-14 DIAGNOSIS — N644 Mastodynia: Secondary | ICD-10-CM | POA: Diagnosis not present

## 2014-01-14 DIAGNOSIS — Z800 Family history of malignant neoplasm of digestive organs: Secondary | ICD-10-CM

## 2014-01-14 DIAGNOSIS — Z79891 Long term (current) use of opiate analgesic: Secondary | ICD-10-CM

## 2014-01-14 DIAGNOSIS — G894 Chronic pain syndrome: Secondary | ICD-10-CM | POA: Diagnosis present

## 2014-01-14 DIAGNOSIS — Z885 Allergy status to narcotic agent status: Secondary | ICD-10-CM

## 2014-01-14 DIAGNOSIS — N760 Acute vaginitis: Secondary | ICD-10-CM | POA: Diagnosis not present

## 2014-01-14 DIAGNOSIS — F419 Anxiety disorder, unspecified: Secondary | ICD-10-CM | POA: Diagnosis present

## 2014-01-14 DIAGNOSIS — Z87891 Personal history of nicotine dependence: Secondary | ICD-10-CM

## 2014-01-14 DIAGNOSIS — Z888 Allergy status to other drugs, medicaments and biological substances status: Secondary | ICD-10-CM

## 2014-01-14 MED ORDER — SODIUM CHLORIDE 0.9 % IV SOLN
1000.0000 mL | Freq: Once | INTRAVENOUS | Status: AC
  Administered 2014-01-15: 1000 mL via INTRAVENOUS

## 2014-01-14 MED ORDER — ONDANSETRON 8 MG PO TBDP: 8.0000 mg | ORAL_TABLET | Freq: Three times a day (TID) | ORAL | Status: DC | PRN

## 2014-01-14 MED ORDER — SODIUM CHLORIDE 0.9 % IV SOLN
1000.0000 mL | INTRAVENOUS | Status: DC
  Administered 2014-01-15 – 2014-01-16 (×4): 1000 mL via INTRAVENOUS

## 2014-01-14 MED ORDER — ONDANSETRON HCL 4 MG/2ML IJ SOLN
4.0000 mg | Freq: Once | INTRAMUSCULAR | Status: AC
  Administered 2014-01-15: 4 mg via INTRAVENOUS
  Filled 2014-01-14: qty 2

## 2014-01-14 MED ORDER — VANCOMYCIN HCL IN DEXTROSE 1-5 GM/200ML-% IV SOLN
1000.0000 mg | Freq: Once | INTRAVENOUS | Status: AC
  Administered 2014-01-15: 1000 mg via INTRAVENOUS
  Filled 2014-01-14: qty 200

## 2014-01-14 MED ORDER — CEPHALEXIN 500 MG PO CAPS: 500.0000 mg | ORAL_CAPSULE | Freq: Three times a day (TID) | ORAL | Status: DC

## 2014-01-14 MED ORDER — MORPHINE SULFATE 4 MG/ML IJ SOLN
4.0000 mg | Freq: Once | INTRAMUSCULAR | Status: AC
  Administered 2014-01-15: 4 mg via INTRAVENOUS
  Filled 2014-01-14: qty 1

## 2014-01-14 NOTE — Progress Notes (Signed)
Subjective:    Patient ID: Kara [name], female    DOB: 07-28-1955, 58 y.o.   MRN: [id number]  HPI Patient here following recent hospitalization. Her chronic problems include history of rheumatoid arthritis, kidney stones, chronic back pain, fibromyalgia. Recent history is that in August she had bilateral breast implant removal. Around September 19 she noticed a small eschar from her incision site and this apparently became infected. She takes Humira per rheumatology and so is at increased risk of infection. She had developed some progressive cellulitis changes initially treated with outpatient antibiotics. Her condition worsens she was admitted on 10-16 through 10-18 with cellulitis of the right breast. Blood cultures negative. Patient was treated with vancomycin and Zosyn. Ultrasound revealed no evidence for abscess but suspicion for collapsed seroma. She had Penrose drain which was pulled prior to discharge.  She was improved at discharge and discharged on doxycycline and Augmentin with plan for 10 days more of oral antibiotics. She developed some nausea and vomiting couple days ago and stopped both medications yesterday.  Occasional diarrhea but not consistently. She's had some minimal soreness right breast but overall nontender. She's had some persistent induration which is unchanged. No fever. She continues to have some thin yellow discharge from her right breast. She is reluctant to go back to plastic surgeon this point and is specifically requesting referral to general surgeon for further evaluation  Past Medical History  Diagnosis Date  . Fibromyalgia   . 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 Arthritis   . Rheumatoid arthritis(714.0)     autoimmune arthritis; methotrexate once/week  . Depression     takes meds daily  . Post-nasal drip     hx of  . Other specified rheumatoid arthritis, right shoulder 08/01/2011  . Asthma   . Allergy   . Chronic kidney  disease   . Urinary incontinence   . Family history of malignant neoplasm of breast   . Chronic pain   . Cellulitis of breast 11/2013    RIGHT BREAST   Past Surgical History  Procedure Laterality Date  . Shoulder surgery  2012    right  . Back surgery  2011    lower back fusion l4-5, s1  . Bladder surgery  2009  . Tubal ligation  1988  . Breast enhancement surgery  2003  . Liposuction  2003    abdominal  . Tonsillectomy  1987  . Total shoulder arthroplasty  08/01/2011    Procedure: TOTAL SHOULDER ARTHROPLASTY;  Surgeon: Eulas Post, MD;  Location[facility];  Service: Orthopedics;  Laterality: Right;  Right total shoulder arthroplasty  . Mass excision  11/03/2011    Procedure: MINOR EXCISION OF MASS;  Surgeon: Wyn Forster., MD;  Location: [facility];  Service: Orthopedics;  Laterality: Left;  debride IP joint, cyst excision left index  . Breast surgery  10/2013    REMOVAL OF BREAST IMPLANTS    reports that she has never smoked. She has never used smokeless tobacco. She reports that she uses illicit drugs (Marijuana). She reports that she does not drink alcohol. family history includes Arthritis in her mother; Breast cancer (age of onset: 31) in her maternal aunt; Breast cancer (age of onset: 35) in her mother; COPD in her father; Cancer (age of onset: 55) in her cousin; Colon cancer (age of onset: 71) in her paternal aunt; Heart disease in her mother; Hypertension in her father; Stomach  cancer (age of onset: 27) in her paternal uncle. Allergies  Allergen Reactions  . Aspirin Anaphylaxis  . Ibuprofen Anaphylaxis  . Ativan [Lorazepam] Other (See Comments)    Makes agitated, combative   . Tramadol Hcl Itching and Rash      Review of Systems  Constitutional: Negative for fever and chills.  Respiratory: Negative for shortness of breath.   Gastrointestinal: Positive for nausea and diarrhea. Negative for vomiting, abdominal pain, blood in stool and abdominal  distention.  Neurological: Negative for dizziness.       Objective:   Physical Exam  Constitutional: She appears well-developed and well-nourished.  Cardiovascular: Normal rate and regular rhythm.   Pulmonary/Chest: Effort normal and breath sounds normal. No respiratory distress. She has no wheezes. She has no rales.  Right breast reveals no warmth or erythema. She has some thin serous type drainage from very small open area on the inferior portion midline of the breast. She has some underlying induration but no fluctuance. Nontender  Musculoskeletal: She exhibits no edema.          Assessment & Plan:  #1 recent cellulitis right breast. Recent ultrasound did not reveal any obvious abscess. Question of seroma. Clinically she appears to be improved but possible intolerance for oral antibiotics. No active cellulitis changes at this time.   We'll have her stop Augmentin and continue doxycycline and add Keflex 500 mg 3 times a day (for staph and strep coverage)  Apparently no wound cultures done. She has question of seroma from recent ultrasound. Patient requesting general surgical consult and will set up. She has refused to go back to plastic surgeon at this point.   #2 rheumatoid arthritis history. Continue close followup with rheumatology. #3 chronic pain syndrome. Patient's had some recent issues with poor communication with her pain management clinic. She is exploring possible change to another clinic.

## 2014-01-14 NOTE — Patient Instructions (Signed)
STOP Augmentin Continue with Doxycycline and start Keflex Take Zofran as needed for nausea and vomiting. Be in touch for any recurrent vomiting or persistent diarrhea.

## 2014-01-14 NOTE — ED Provider Notes (Signed)
CSN: [id number]     Arrival date & time 01/14/14  2100 History   First MD Initiated Contact with Patient 01/14/14 2338     Chief Complaint  Patient presents with  . Cellulitis  . Nausea  . Emesis     (Consider location/radiation/quality/duration/timing/severity/associated sxs/prior Treatment) Patient is a 58 y.o. female presenting with vomiting. The history is provided by the patient.  Emesis She had breast implants removed 2 months ago and had developed cellulitis in the right breast. Her and she had a drainage procedure done by her plastic surgeon and a Penrose drain had been placed and she was put on antibiotics. She felt to improve and was admitted to the hospital for IV antibiotics. There was significant improvement while in the hospital, and the drain was removed. She was discharged 3 days ago on amoxicillin-and clavulanic acid and doxycycline. She states that she has had nausea and vomiting with medication has not been able to hold it down. With that, the breast has become more swollen and she started to have some purulent drainage she denies fever, chills, sweats. She rates pain in her breast at 8/10. She saw her PCP earlier today who gave her some antiemetics which have not been helping. She is concerned that she has not been evaluated by a general surgeon/breast surgeon. Of note, if she has rheumatoid arthritis and is on Humira  Past Medical History  Diagnosis Date  . Fibromyalgia   . 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 Arthritis   . Rheumatoid arthritis(714.0)     autoimmune arthritis; methotrexate once/week  . Depression     takes meds daily  . Post-nasal drip     hx of  . Other specified rheumatoid arthritis, right shoulder 08/01/2011  . Asthma   . Allergy   . Chronic kidney disease   . Urinary incontinence   . Family history of malignant neoplasm of breast   . Chronic pain   . Cellulitis of breast 11/2013    RIGHT BREAST   Past  Surgical History  Procedure Laterality Date  . Shoulder surgery  2012    right  . Back surgery  2011    lower back fusion l4-5, s1  . Bladder surgery  2009  . Tubal ligation  1988  . Breast enhancement surgery  2003  . Liposuction  2003    abdominal  . Tonsillectomy  1987  . Total shoulder arthroplasty  08/01/2011    Procedure: TOTAL SHOULDER ARTHROPLASTY;  Surgeon: Eulas Post, MD;  Location: [facility] OR;  Service: Orthopedics;  Laterality: Right;  Right total shoulder arthroplasty  . Mass excision  11/03/2011    Procedure: MINOR EXCISION OF MASS;  Surgeon: Wyn Forster., MD;  Location: [facility];  Service: Orthopedics;  Laterality: Left;  debride IP joint, cyst excision left index  . Breast surgery  10/2013    REMOVAL OF BREAST IMPLANTS  . Breast implants removed     Family History  Problem Relation Age of Onset  . Arthritis Mother   . Heart disease Mother     ?psvt  . Breast cancer Mother 51    TAH/BSO  . COPD Father   . Hypertension Father   . Breast cancer Maternal Aunt 27    deceased  . Cancer Cousin 110    female; unknown primary  . Colon cancer Paternal Aunt 96    deceased at 1  . Stomach  cancer Paternal Uncle 83    deceased at 88   History  Substance Use Topics  . Smoking status: Never Smoker   . Smokeless tobacco: Never Used  . Alcohol Use: No   OB History   Grav Para Term Preterm Abortions TAB SAB Ect Mult Living                 Review of Systems  Gastrointestinal: Positive for vomiting.  All other systems reviewed and are negative.     Allergies  Aspirin; Ibuprofen; Ativan; and Tramadol hcl  Home Medications   Prior to Admission medications   Medication Sig Start Date End Date Taking? Authorizing Provider  ALPRAZolam Prudy Feeler) 0.5 MG tablet Take 0.5 mg by mouth 2 (two) times daily as needed for anxiety.  12/17/13  Yes Historical Provider, MD  amphetamine-dextroamphetamine (ADDERALL) 30 MG tablet Take 30 mg by mouth 3 (three)  times daily. 01/15/13  Yes Historical Provider, MD  fentaNYL (DURAGESIC - DOSED MCG/HR) 50 MCG/HR Place 50 mcg onto the skin every 3 (three) days.   Yes Historical Provider, MD  HYDROmorphone (DILAUDID) 2 MG tablet Take 2-4 mg by mouth every 4 (four) hours as needed for severe pain.    Yes Historical Provider, MD  meloxicam (MOBIC) 15 MG tablet Take 15 mg by mouth daily.  04/23/12  Yes Historical Provider, MD  ondansetron (ZOFRAN ODT) 8 MG disintegrating tablet Take 1 tablet (8 mg total) by mouth every 8 (eight) hours as needed for nausea or vomiting. 01/14/14  Yes [name], MD  oxyCODONE-acetaminophen (PERCOCET) 10-325 MG per tablet Take 1 tablet by mouth every 8 (eight) hours as needed for pain. 01/11/14  Yes Richarda Overlie, MD  zolpidem (AMBIEN) 10 MG tablet Take 10 mg by mouth at bedtime as needed for sleep.    Yes Historical Provider, MD   BP 131/84  Pulse 98  Temp(Src) 98.2 F (36.8 C) (Oral)  Resp 16  Ht 5\' 4"  (1.626 m)  Wt 146 lb (66.225 kg)  BMI 25.05 kg/m2  SpO2 98% Physical Exam  Nursing note and vitals reviewed.  58 year old female, resting comfortably and in no acute distress. Vital signs are normal. Oxygen saturation is 98%, which is normal. Head is normocephalic and atraumatic. PERRLA, EOMI. Oropharynx is clear. Neck is nontender and supple without adenopathy or JVD. Back is nontender and there is no CVA tenderness. Lungs are clear without rales, wheezes, or rhonchi. Chest: Right breast has an incision in the inferior portion of the breast with some erythema and induration. There is some serosanguineous drainage from the central portion of the incision but no fluctuance. This is moderately tender. There is no axillary adenopathy palpable. There is no mid nipple discharge. Heart has regular rate and rhythm without murmur. Abdomen is soft, flat, nontender without masses or hepatosplenomegaly and peristalsis is normoactive. Extremities have no cyanosis or edema, full  range of motion is present. Skin is warm and dry without rash. Neurologic: Mental status is normal, cranial nerves are intact, there are no motor or sensory deficits.  ED Course  Procedures (including critical care time) Labs Review Results for orders placed during the hospital encounter of 01/14/14  CBC WITH DIFFERENTIAL      Result Value Ref Range   WBC 9.4  4.0 - 10.5 K/uL   RBC 4.62  3.87 - 5.11 MIL/uL   Hemoglobin 12.5  12.0 - 15.0 g/dL   HCT 01/16/14  50.9 - 32.6 %   MCV 83.1  78.0 - 100.0 fL   MCH 27.1  26.0 - 34.0 pg   MCHC 32.6  30.0 - 36.0 g/dL   RDW 69.4  50.3 - 88.8 %   Platelets 594 (*) 150 - 400 K/uL   Neutrophils Relative % 41 (*) 43 - 77 %   Neutro Abs 3.8  1.7 - 7.7 K/uL   Lymphocytes Relative 50 (*) 12 - 46 %   Lymphs Abs 4.6 (*) 0.7 - 4.0 K/uL   Monocytes Relative 7  3 - 12 %   Monocytes Absolute 0.7  0.1 - 1.0 K/uL   Eosinophils Relative 2  0 - 5 %   Eosinophils Absolute 0.2  0.0 - 0.7 K/uL   Basophils Relative 0  0 - 1 %   Basophils Absolute 0.0  0.0 - 0.1 K/uL  BASIC METABOLIC PANEL      Result Value Ref Range   Sodium 139  137 - 147 mEq/L   Potassium 3.8  3.7 - 5.3 mEq/L   Chloride 100  96 - 112 mEq/L   CO2 24  19 - 32 mEq/L   Glucose, Bld 105 (*) 70 - 99 mg/dL   BUN 18  6 - 23 mg/dL   Creatinine, Ser 2.80  0.50 - 1.10 mg/dL   Calcium 03.4  8.4 - 91.7 mg/dL   GFR calc non Af Amer >90  >90 mL/min   GFR calc Af Amer >90  >90 mL/min   Anion gap 15  5 - 15   MDM   Final diagnoses:  Cellulitis of female breast    Worsening cellulitis of the right breast. In tolerance to oral antibiotics. The records are reviewed confirming recent hospitalization for cellulitis. At this point, I think she is going to need to come into the hospital to a PICC line placed in anticipation of going home on IV antibiotics.  WBC is normal without any left shift. Case is discussed with Dr. Adela Glimpse of tried hospitalist who agrees to admit the patient.  Dione Booze,  MD 01/15/14 [phone]

## 2014-01-14 NOTE — Telephone Encounter (Signed)
OK to refer but pt needs to know (as we informed her) that they may not accept her in transfer- since she is already getting regular follow up through another pain management clinic.

## 2014-01-14 NOTE — Telephone Encounter (Signed)
Pt stated [name]. She thinks that is the name of it.

## 2014-01-14 NOTE — ED Notes (Signed)
Pt transported from home with c/o cellulitis from breast implant removal August, seen at [facility] yesterday

## 2014-01-14 NOTE — Telephone Encounter (Signed)
Referral is ordered

## 2014-01-14 NOTE — Telephone Encounter (Signed)
Pt called and stated she wanted the referral to [facility] but doesn't want [facility] or [facility].

## 2014-01-14 NOTE — Progress Notes (Signed)
Pre visit review using our clinic review tool, if applicable. No additional management support is needed unless otherwise documented below in the visit note. 

## 2014-01-14 NOTE — ED Notes (Signed)
Pt states she has cellulitis of her right breast  Pt states she went to [facility] last Friday and was admitted for IV antibiotics and was discharged on Sunday  Pt states she went to her dr today and was given antibiotics and was given medication for nausea  Pt states she has been vomiting since the antibiotics  Pt states her breast is getting worse  Pt states she feels very weak

## 2014-01-14 NOTE — Telephone Encounter (Signed)
Does she have another practice in mind?

## 2014-01-15 ENCOUNTER — Inpatient Hospital Stay (HOSPITAL_COMMUNITY): Payer: Managed Care, Other (non HMO)

## 2014-01-15 ENCOUNTER — Encounter (HOSPITAL_COMMUNITY): Payer: Self-pay | Admitting: Internal Medicine

## 2014-01-15 DIAGNOSIS — F419 Anxiety disorder, unspecified: Secondary | ICD-10-CM | POA: Diagnosis present

## 2014-01-15 DIAGNOSIS — Z885 Allergy status to narcotic agent status: Secondary | ICD-10-CM | POA: Diagnosis not present

## 2014-01-15 DIAGNOSIS — N189 Chronic kidney disease, unspecified: Secondary | ICD-10-CM | POA: Diagnosis present

## 2014-01-15 DIAGNOSIS — N63 Unspecified lump in breast: Secondary | ICD-10-CM | POA: Diagnosis present

## 2014-01-15 DIAGNOSIS — F329 Major depressive disorder, single episode, unspecified: Secondary | ICD-10-CM | POA: Diagnosis present

## 2014-01-15 DIAGNOSIS — Z79899 Other long term (current) drug therapy: Secondary | ICD-10-CM | POA: Diagnosis not present

## 2014-01-15 DIAGNOSIS — Z79891 Long term (current) use of opiate analgesic: Secondary | ICD-10-CM | POA: Diagnosis not present

## 2014-01-15 DIAGNOSIS — Z800 Family history of malignant neoplasm of digestive organs: Secondary | ICD-10-CM | POA: Diagnosis not present

## 2014-01-15 DIAGNOSIS — M1990 Unspecified osteoarthritis, unspecified site: Secondary | ICD-10-CM | POA: Diagnosis present

## 2014-01-15 DIAGNOSIS — Z8249 Family history of ischemic heart disease and other diseases of the circulatory system: Secondary | ICD-10-CM | POA: Diagnosis not present

## 2014-01-15 DIAGNOSIS — N61 Inflammatory disorders of breast: Principal | ICD-10-CM

## 2014-01-15 DIAGNOSIS — L0390 Cellulitis, unspecified: Secondary | ICD-10-CM | POA: Insufficient documentation

## 2014-01-15 DIAGNOSIS — Z886 Allergy status to analgesic agent status: Secondary | ICD-10-CM | POA: Diagnosis not present

## 2014-01-15 DIAGNOSIS — M797 Fibromyalgia: Secondary | ICD-10-CM | POA: Diagnosis present

## 2014-01-15 DIAGNOSIS — Z87442 Personal history of urinary calculi: Secondary | ICD-10-CM | POA: Diagnosis not present

## 2014-01-15 DIAGNOSIS — J449 Chronic obstructive pulmonary disease, unspecified: Secondary | ICD-10-CM | POA: Diagnosis present

## 2014-01-15 DIAGNOSIS — Z8261 Family history of arthritis: Secondary | ICD-10-CM | POA: Diagnosis not present

## 2014-01-15 DIAGNOSIS — Z888 Allergy status to other drugs, medicaments and biological substances status: Secondary | ICD-10-CM | POA: Diagnosis not present

## 2014-01-15 DIAGNOSIS — Z96611 Presence of right artificial shoulder joint: Secondary | ICD-10-CM | POA: Diagnosis present

## 2014-01-15 DIAGNOSIS — N644 Mastodynia: Secondary | ICD-10-CM | POA: Diagnosis not present

## 2014-01-15 DIAGNOSIS — M069 Rheumatoid arthritis, unspecified: Secondary | ICD-10-CM | POA: Diagnosis present

## 2014-01-15 DIAGNOSIS — Z825 Family history of asthma and other chronic lower respiratory diseases: Secondary | ICD-10-CM | POA: Diagnosis not present

## 2014-01-15 DIAGNOSIS — Z8709 Personal history of other diseases of the respiratory system: Secondary | ICD-10-CM

## 2014-01-15 DIAGNOSIS — Z803 Family history of malignant neoplasm of breast: Secondary | ICD-10-CM | POA: Diagnosis not present

## 2014-01-15 DIAGNOSIS — Z87891 Personal history of nicotine dependence: Secondary | ICD-10-CM | POA: Diagnosis not present

## 2014-01-15 DIAGNOSIS — G894 Chronic pain syndrome: Secondary | ICD-10-CM | POA: Diagnosis present

## 2014-01-15 DIAGNOSIS — M545 Low back pain: Secondary | ICD-10-CM | POA: Diagnosis present

## 2014-01-15 DIAGNOSIS — N760 Acute vaginitis: Secondary | ICD-10-CM | POA: Diagnosis not present

## 2014-01-15 DIAGNOSIS — J45909 Unspecified asthma, uncomplicated: Secondary | ICD-10-CM | POA: Diagnosis present

## 2014-01-15 LAB — CBC WITH DIFFERENTIAL/PLATELET
BASOS ABS: 0 10*3/uL (ref 0.0–0.1)
Basophils Relative: 0 % (ref 0–1)
EOS PCT: 2 % (ref 0–5)
Eosinophils Absolute: 0.2 10*3/uL (ref 0.0–0.7)
HCT: 38.4 % (ref 36.0–46.0)
Hemoglobin: 12.5 g/dL (ref 12.0–15.0)
LYMPHS PCT: 50 % — AB (ref 12–46)
Lymphs Abs: 4.6 10*3/uL — ABNORMAL HIGH (ref 0.7–4.0)
MCH: 27.1 pg (ref 26.0–34.0)
MCHC: 32.6 g/dL (ref 30.0–36.0)
MCV: 83.1 fL (ref 78.0–100.0)
Monocytes Absolute: 0.7 10*3/uL (ref 0.1–1.0)
Monocytes Relative: 7 % (ref 3–12)
NEUTROS ABS: 3.8 10*3/uL (ref 1.7–7.7)
Neutrophils Relative %: 41 % — ABNORMAL LOW (ref 43–77)
Platelets: 594 10*3/uL — ABNORMAL HIGH (ref 150–400)
RBC: 4.62 MIL/uL (ref 3.87–5.11)
RDW: 13.1 % (ref 11.5–15.5)
WBC: 9.4 10*3/uL (ref 4.0–10.5)

## 2014-01-15 LAB — CBC
HCT: 34.8 % — ABNORMAL LOW (ref 36.0–46.0)
Hemoglobin: 11 g/dL — ABNORMAL LOW (ref 12.0–15.0)
MCH: 26.5 pg (ref 26.0–34.0)
MCHC: 31.6 g/dL (ref 30.0–36.0)
MCV: 83.9 fL (ref 78.0–100.0)
Platelets: 498 10*3/uL — ABNORMAL HIGH (ref 150–400)
RBC: 4.15 MIL/uL (ref 3.87–5.11)
RDW: 13.2 % (ref 11.5–15.5)
WBC: 7.9 10*3/uL (ref 4.0–10.5)

## 2014-01-15 LAB — BASIC METABOLIC PANEL
ANION GAP: 15 (ref 5–15)
BUN: 18 mg/dL (ref 6–23)
CALCIUM: 10 mg/dL (ref 8.4–10.5)
CHLORIDE: 100 meq/L (ref 96–112)
CO2: 24 meq/L (ref 19–32)
Creatinine, Ser: 0.64 mg/dL (ref 0.50–1.10)
GFR calc Af Amer: >90 mL/min (ref >90)
GFR calc non Af Amer: >90 mL/min (ref >90)
Glucose, Bld: 105 mg/dL — ABNORMAL HIGH (ref 70–99)
Potassium: 3.8 mEq/L (ref 3.7–5.3)
Sodium: 139 mEq/L (ref 137–147)

## 2014-01-15 LAB — COMPREHENSIVE METABOLIC PANEL
ALT: 13 U/L (ref 0–35)
ANION GAP: 10 (ref 5–15)
AST: 14 U/L (ref 0–37)
Albumin: 3.3 g/dL — ABNORMAL LOW (ref 3.5–5.2)
Alkaline Phosphatase: 120 U/L — ABNORMAL HIGH (ref 39–117)
BUN: 18 mg/dL (ref 6–23)
CALCIUM: 9.1 mg/dL (ref 8.4–10.5)
CO2: 27 meq/L (ref 19–32)
CREATININE: 0.62 mg/dL (ref 0.50–1.10)
Chloride: 103 mEq/L (ref 96–112)
GFR calc non Af Amer: >90 mL/min (ref >90)
Glucose, Bld: 115 mg/dL — ABNORMAL HIGH (ref 70–99)
Potassium: 3.6 mEq/L — ABNORMAL LOW (ref 3.7–5.3)
Sodium: 140 mEq/L (ref 137–147)
Total Bilirubin: <0.2 mg/dL — ABNORMAL LOW (ref 0.3–1.2)
Total Protein: 6.6 g/dL (ref 6.0–8.3)

## 2014-01-15 LAB — CULTURE, BLOOD (ROUTINE X 2)
Culture: NO GROWTH
Culture: NO GROWTH

## 2014-01-15 LAB — PHOSPHORUS: PHOSPHORUS: 4 mg/dL (ref 2.3–4.6)

## 2014-01-15 LAB — MAGNESIUM: Magnesium: 2.1 mg/dL (ref 1.5–2.5)

## 2014-01-15 MED ORDER — MORPHINE SULFATE 4 MG/ML IJ SOLN: 4.0000 mg | INTRAMUSCULAR | Status: DC | PRN

## 2014-01-15 MED ORDER — OXYCODONE-ACETAMINOPHEN 10-325 MG PO TABS: 1.0000 | ORAL_TABLET | Freq: Three times a day (TID) | ORAL | Status: DC | PRN

## 2014-01-15 MED ORDER — DOCUSATE SODIUM 100 MG PO CAPS
100.0000 mg | ORAL_CAPSULE | Freq: Two times a day (BID) | ORAL | Status: DC
  Filled 2014-01-15 (×5): qty 1

## 2014-01-15 MED ORDER — ZOLPIDEM TARTRATE 5 MG PO TABS
5.0000 mg | ORAL_TABLET | Freq: Every evening | ORAL | Status: DC | PRN
  Administered 2014-01-15 – 2014-01-19 (×5): 5 mg via ORAL
  Filled 2014-01-15 (×5): qty 1

## 2014-01-15 MED ORDER — AMPHETAMINE-DEXTROAMPHETAMINE 20 MG PO TABS
60.0000 mg | ORAL_TABLET | Freq: Every day | ORAL | Status: DC
  Administered 2014-01-15 – 2014-01-20 (×4): 60 mg via ORAL
  Filled 2014-01-15: qty 6
  Filled 2014-01-15 (×4): qty 3

## 2014-01-15 MED ORDER — OXYCODONE-ACETAMINOPHEN 5-325 MG PO TABS
1.0000 | ORAL_TABLET | Freq: Three times a day (TID) | ORAL | Status: DC | PRN
  Administered 2014-01-15 – 2014-01-17 (×3): 1 via ORAL
  Filled 2014-01-15 (×4): qty 1

## 2014-01-15 MED ORDER — ZOLPIDEM TARTRATE 10 MG PO TABS: 10.0000 mg | ORAL_TABLET | Freq: Every evening | ORAL | Status: DC | PRN

## 2014-01-15 MED ORDER — ACETAMINOPHEN 650 MG RE SUPP: 650.0000 mg | Freq: Four times a day (QID) | RECTAL | Status: DC | PRN

## 2014-01-15 MED ORDER — OXYCODONE HCL 5 MG PO TABS
5.0000 mg | ORAL_TABLET | Freq: Three times a day (TID) | ORAL | Status: DC | PRN
  Administered 2014-01-17: 5 mg via ORAL
  Filled 2014-01-15: qty 1

## 2014-01-15 MED ORDER — ONDANSETRON HCL 4 MG/2ML IJ SOLN
4.0000 mg | Freq: Four times a day (QID) | INTRAMUSCULAR | Status: DC | PRN
  Administered 2014-01-17 – 2014-01-20 (×2): 4 mg via INTRAVENOUS
  Filled 2014-01-15 (×2): qty 2

## 2014-01-15 MED ORDER — AMPHETAMINE-DEXTROAMPHETAMINE 20 MG PO TABS
30.0000 mg | ORAL_TABLET | ORAL | Status: DC
  Administered 2014-01-15 – 2014-01-20 (×6): 30 mg via ORAL
  Filled 2014-01-15 (×4): qty 1
  Filled 2014-01-15: qty 3
  Filled 2014-01-15 (×6): qty 1

## 2014-01-15 MED ORDER — HYDROCODONE-ACETAMINOPHEN 5-325 MG PO TABS: 1.0000 | ORAL_TABLET | ORAL | Status: DC | PRN

## 2014-01-15 MED ORDER — ONDANSETRON HCL 4 MG/2ML IJ SOLN: 4.0000 mg | Freq: Three times a day (TID) | INTRAMUSCULAR | Status: DC | PRN

## 2014-01-15 MED ORDER — ALPRAZOLAM 0.5 MG PO TABS
0.5000 mg | ORAL_TABLET | Freq: Three times a day (TID) | ORAL | Status: DC | PRN
  Administered 2014-01-15 – 2014-01-16 (×2): 0.5 mg via ORAL
  Filled 2014-01-15 (×2): qty 1

## 2014-01-15 MED ORDER — FENTANYL 50 MCG/HR TD PT72
50.0000 ug | MEDICATED_PATCH | TRANSDERMAL | Status: DC
  Administered 2014-01-15 – 2014-01-18 (×2): 50 ug via TRANSDERMAL
  Filled 2014-01-15 (×2): qty 1

## 2014-01-15 MED ORDER — POTASSIUM CHLORIDE CRYS ER 20 MEQ PO TBCR
40.0000 meq | EXTENDED_RELEASE_TABLET | Freq: Once | ORAL | Status: AC
  Administered 2014-01-15: 40 meq via ORAL
  Filled 2014-01-15: qty 2

## 2014-01-15 MED ORDER — ONDANSETRON HCL 4 MG PO TABS
4.0000 mg | ORAL_TABLET | Freq: Four times a day (QID) | ORAL | Status: DC | PRN
  Administered 2014-01-20: 4 mg via ORAL
  Filled 2014-01-15: qty 1

## 2014-01-15 MED ORDER — HYDROMORPHONE HCL 2 MG PO TABS
2.0000 mg | ORAL_TABLET | ORAL | Status: DC | PRN
  Administered 2014-01-15 (×2): 2 mg via ORAL
  Filled 2014-01-15 (×2): qty 1

## 2014-01-15 MED ORDER — ACETAMINOPHEN 325 MG PO TABS
650.0000 mg | ORAL_TABLET | Freq: Four times a day (QID) | ORAL | Status: DC | PRN
  Administered 2014-01-17: 650 mg via ORAL
  Filled 2014-01-15: qty 2

## 2014-01-15 MED ORDER — ALBUTEROL SULFATE (2.5 MG/3ML) 0.083% IN NEBU: 2.5000 mg | INHALATION_SOLUTION | RESPIRATORY_TRACT | Status: DC | PRN

## 2014-01-15 MED ORDER — VANCOMYCIN HCL 1000 MG IV SOLR
750.0000 mg | Freq: Two times a day (BID) | INTRAVENOUS | Status: DC
  Administered 2014-01-15 – 2014-01-20 (×11): 750 mg via INTRAVENOUS
  Filled 2014-01-15 (×12): qty 750

## 2014-01-15 MED ORDER — MORPHINE SULFATE 4 MG/ML IJ SOLN
4.0000 mg | INTRAMUSCULAR | Status: DC | PRN
  Administered 2014-01-16 (×2): 4 mg via INTRAVENOUS
  Filled 2014-01-15 (×2): qty 1

## 2014-01-15 NOTE — Progress Notes (Signed)
I have seen and assessed patient and agree with Dr Celene Kras assessment and plan. Patient has been seen by general surgery and patient to go to the operating room tomorrow for incision and drainage and further evaluation. Continue current IV antibiotics.

## 2014-01-15 NOTE — H&P (Signed)
PCP:  [name], MD    Chief Complaint:  Right breast swelling  HPI: Kara [name] is a 58 y.o. 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   Presented with  She had her breast implants taken out in August. Last week she developed cellulitis of right breast and have been admitted to [facility]. She was discharged to home 10/18 on Augmentin she was switched to  Doxycyline she was not able to tolerate them due to nausea. She only took 1 day. Since then her breast started to feel worm and hard and she started to have chills.   Hospitalist was called for admission for Right breast cellulitis.  Review of Systems:    Pertinent positives include:  chills, right breast pain  Constitutional:  No weight loss, night sweats, Fevers, fatigue, weight loss  HEENT:  No headaches, Difficulty swallowing,Tooth/dental problems,Sore throat,  No sneezing, itching, ear ache, nasal congestion, post nasal drip,  Cardio-vascular:  No chest pain, Orthopnea, PND, anasarca, dizziness, palpitations.no Bilateral lower extremity swelling  GI:  No heartburn, indigestion, abdominal pain, nausea, vomiting, diarrhea, change in bowel habits, loss of appetite, melena, blood in stool, hematemesis Resp:  no shortness of breath at rest. No dyspnea on exertion, No excess mucus, no productive cough, No non-productive cough, No coughing up of blood.No change in color of mucus.No wheezing. Skin:  no rash or lesions. No jaundice GU:  no dysuria, change in color of urine, no urgency or frequency. No straining to urinate.  No flank pain.  Musculoskeletal:  No joint pain or no joint swelling. No  decreased range of motion. No back pain.  Psych:  No change in mood or affect. No depression or anxiety. No memory loss.  Neuro: no localizing neurological complaints, no tingling, no weakness, no double vision, no gait abnormality, no slurred speech, no confusion  Otherwise ROS are negative except for above, 10 systems were reviewed  Past Medical History: Past Medical History  Diagnosis Date  . Fibromyalgia   . 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 Arthritis   . Rheumatoid arthritis(714.0)     autoimmune arthritis; methotrexate once/week  . Depression     takes meds daily  . Post-nasal drip     hx of  . Other specified rheumatoid arthritis, right shoulder 08/01/2011  . Asthma   . Allergy   . Chronic kidney disease   . Urinary incontinence   . Family history of malignant neoplasm of breast   . Chronic pain   . Cellulitis of breast 11/2013    RIGHT BREAST   Past Surgical History  Procedure Laterality Date  . Shoulder surgery  2012    right  . Back surgery  2011    lower back fusion l4-5, s1  . Bladder surgery  2009  . Tubal ligation  1988  . Breast enhancement surgery  2003  . Liposuction  2003    abdominal  . Tonsillectomy  1987  . Total shoulder arthroplasty  08/01/2011    Procedure: TOTAL SHOULDER ARTHROPLASTY;  Surgeon: Eulas Post, MD;  Location: [facility] OR;  Service: Orthopedics;  Laterality: Right;  Right total shoulder arthroplasty  . Mass excision  11/03/2011  Procedure: MINOR EXCISION OF MASS;  Surgeon: Wyn Forster., MD;  Location: [facility];  Service: Orthopedics;  Laterality: Left;  debride IP joint, cyst excision left index  . Breast surgery  10/2013    REMOVAL OF BREAST IMPLANTS  . Breast implants removed       Medications: Prior to Admission medications   Medication Sig Start Date End Date Taking? Authorizing Provider  ALPRAZolam Prudy Feeler) 0.5 MG tablet Take 0.5 mg by mouth 3 (three) times daily as  needed for anxiety.  12/17/13  Yes Historical Provider, MD  amphetamine-dextroamphetamine (ADDERALL) 30 MG tablet Take 30 mg by mouth 3 (three) times daily. 60 mg in Am and 30 mg at [facility] 01/15/13  Yes Historical Provider, MD  fentaNYL (DURAGESIC - DOSED MCG/HR) 50 MCG/HR Place 50 mcg onto the skin every 3 (three) days.   Yes Historical Provider, MD  HYDROmorphone (DILAUDID) 2 MG tablet Take 2-4 mg by mouth every 4 (four) hours as needed for severe pain.    Yes Historical Provider, MD  meloxicam (MOBIC) 15 MG tablet Take 15 mg by mouth daily.  04/23/12  Yes Historical Provider, MD  ondansetron (ZOFRAN ODT) 8 MG disintegrating tablet Take 1 tablet (8 mg total) by mouth every 8 (eight) hours as needed for nausea or vomiting. 01/14/14  Yes [name], MD  oxyCODONE-acetaminophen (PERCOCET) 10-325 MG per tablet Take 1 tablet by mouth every 8 (eight) hours as needed for pain. 01/11/14  Yes Richarda Overlie, MD  zolpidem (AMBIEN) 10 MG tablet Take 10 mg by mouth at bedtime as needed for sleep.    Yes Historical Provider, MD    Allergies:   Allergies  Allergen Reactions  . Aspirin Anaphylaxis  . Ibuprofen Anaphylaxis  . Ativan [Lorazepam] Other (See Comments)    Makes agitated, combative   . Tramadol Hcl Itching and Rash    Social History:  Ambulatory   Independently  Lives at home With family     reports that she has never smoked. She has never used smokeless tobacco. She reports that she does not drink alcohol or use illicit drugs.    Family History: family history includes Arthritis in her mother; Breast cancer (age of onset: 47) in her maternal aunt; Breast cancer (age of onset: 15) in her mother; COPD in her father; Cancer (age of onset: 76) in her cousin; Colon cancer (age of onset: 52) in her paternal aunt; Heart disease in her mother; Hypertension in her father; Stomach cancer (age of onset: 72) in her paternal uncle.    Physical Exam: Patient Vitals for the past 24 hrs:  BP Temp  Temp src Pulse Resp SpO2 Height Weight  01/14/14 2358 132/79 mmHg - - 96 18 99 % - -  01/14/14 2118 131/84 mmHg 98.2 F (36.8 C) Oral 98 16 98 % 5\' 4"  (1.626 m) 66.225 kg (146 lb)    1. General:  in No Acute distress 2. Psychological: Alert and  Oriented 3. Head/ENT:   Moist   Mucous Membranes                          Head Non traumatic, neck supple                          Normal  Dentition 4. SKIN: normal  Skin turgor,  Skin clean Dry serosanguineous discharge noted from right breast. Of firmness and induration noted from  both breasts bilaterally. Well healing incisions on the left noted 5. Heart: Regular rate and rhythm no Murmur, Rub or gallop 6. Lungs: Clear to auscultation bilaterally, no wheezes or crackles   7. Abdomen: Soft, non-tender, Non distended 8. Lower extremities: no clubbing, cyanosis, or edema 9. Neurologically Grossly intact, moving all 4 extremities equally 10. MSK: Normal range of motion  body mass index is 25.05 kg/(m^2).   Labs on Admission:   Recent Labs  01/14/14 2350  NA 139  K 3.8  CL 100  CO2 24  GLUCOSE 105*  BUN 18  CREATININE 0.64  CALCIUM 10.0   No results found for this basename: AST, ALT, ALKPHOS, BILITOT, PROT, ALBUMIN,  in the last 72 hours No results found for this basename: LIPASE, AMYLASE,  in the last 72 hours  Recent Labs  01/14/14 2350  WBC 9.4  NEUTROABS 3.8  HGB 12.5  HCT 38.4  MCV 83.1  PLT 594*   No results found for this basename: CKTOTAL, CKMB, CKMBINDEX, TROPONINI,  in the last 72 hours No results found for this basename: TSH, T4TOTAL, FREET3, T3FREE, THYROIDAB,  in the last 72 hours No results found for this basename: VITAMINB12, FOLATE, FERRITIN, TIBC, IRON, RETICCTPCT,  in the last 72 hours No results found for this basename: HGBA1C    Estimated Creatinine Clearance: 72.6 ml/min (by C-G formula based on Cr of 0.64). ABG    Component Value Date/Time   TCO2 23 06/12/2009 1751     No results found for this  basename: DDIMER   BNP (last 3 results) No results found for this basename: PROBNP,  in the last 8760 hours  Filed Weights   01/14/14 2118  Weight: 66.225 kg (146 lb)     Cultures:    Component Value Date/Time   SDES BLOOD RIGHT ANTECUBITAL 01/09/2014 1135   SPECREQUEST BOTTLES DRAWN AEROBIC AND ANAEROBIC 01/09/2014 1135   CULT  Value:        BLOOD CULTURE RECEIVED NO GROWTH TO DATE CULTURE WILL BE HELD FOR 5 DAYS BEFORE ISSUING A FINAL NEGATIVE REPORT Performed at [facility] 01/09/2014 1135   REPTSTATUS PENDING 01/09/2014 1135     Radiological Exams on Admission: No results found.  Chart has been reviewed  Assessment/Plan  58 years old female with recently removed bilateral breast implants complicated by cellulitis and the wound infection. Inability to tolerate by mouth antibiotics being admitted for IV antibiotics  Present on Admission:  . Cellulitis of female breast - patient is unable to tolerate by mouth antibiotics do to side effects including nausea and vomiting. Will admit given worsening subjective swelling. IV antibiotics with vancomycin for now and surgical consult in the morning. We'll repeat ultrasound to father clarified if there is any abscess noted.   rheumatoid arthritis Will hold immunosuppressive medications    Prophylaxis: SCD, Protonix  CODE STATUS:  FULL CODE    Other plan as per orders.  I have spent a total of 55 min on this admission  Ryin Schillo 01/15/2014, 1:44 AM  [facility]  Pager [phone]   after 2 AM please page floor coverage PA If 7AM-7PM, please contact the day team taking care of the patient  [email or web address]  Password [email or web address]

## 2014-01-15 NOTE — Progress Notes (Addendum)
Dr Janee Morn paged regarding the [place] ordered for patient.  Dr Janee Morn to speak with the surgical PA and breast center.  [id number] Talked with Misty in Ultrasound that patient cleared by Breast Center Radiologist to have ultrasound inpatient per Dr Janee Morn

## 2014-01-15 NOTE — Progress Notes (Signed)
ANTIBIOTIC CONSULT NOTE - INITIAL  Pharmacy Consult for Vancomycin Indication: Right breast cellulitis  Allergies  Allergen Reactions  . Aspirin Anaphylaxis  . Ibuprofen Anaphylaxis  . Ativan [Lorazepam] Other (See Comments)    Makes agitated, combative   . Tramadol Hcl Itching and Rash    Patient Measurements: Height: 5\' 4"  (162.6 cm) Weight: 146 lb (66.225 kg) IBW/kg (Calculated) : 54.7   Vital Signs: Temp: 98.9 F (37.2 C) (10/22 0235) Temp Source: Oral (10/22 0235) BP: 118/87 mmHg (10/22 0235) Pulse Rate: 87 (10/22 0235) Intake/Output from previous day:   Intake/Output from this shift:    Labs:  Recent Labs  01/14/14 2350  WBC 9.4  HGB 12.5  PLT 594*  CREATININE 0.64   Estimated Creatinine Clearance: 72.6 ml/min (by C-G formula based on Cr of 0.64). No results found for this basename: VANCOTROUGH, VANCOPEAK, VANCORANDOM, GENTTROUGH, GENTPEAK, GENTRANDOM, TOBRATROUGH, TOBRAPEAK, TOBRARND, AMIKACINPEAK, AMIKACINTROU, AMIKACIN,  in the last 72 hours   Microbiology: Recent Results (from the past 720 hour(s))  CULTURE, BLOOD (ROUTINE X 2)     Status: None   Collection Time    01/09/14 11:25 AM      Result Value Ref Range Status   Specimen Description BLOOD RIGHT ANTECUBITAL   Final   Special Requests BOTTLES DRAWN AEROBIC AND ANAEROBIC 01/11/14   Final   Culture  Setup Time     Final   Value: 01/09/2014 17:19     Performed at 01/11/2014   Culture     Final   Value:        BLOOD CULTURE RECEIVED NO GROWTH TO DATE CULTURE WILL BE HELD FOR 5 DAYS BEFORE ISSUING A FINAL NEGATIVE REPORT     Performed at [facility]   Report Status PENDING   Incomplete  CULTURE, BLOOD (ROUTINE X 2)     Status: None   Collection Time    01/09/14 11:35 AM      Result Value Ref Range Status   Specimen Description BLOOD RIGHT ANTECUBITAL   Final   Special Requests BOTTLES DRAWN AEROBIC AND ANAEROBIC 01/11/14   Final   Culture  Setup Time     Final   Value: 01/09/2014  17:18     Performed at 01/11/2014   Culture     Final   Value:        BLOOD CULTURE RECEIVED NO GROWTH TO DATE CULTURE WILL BE HELD FOR 5 DAYS BEFORE ISSUING A FINAL NEGATIVE REPORT     Performed at [facility]   Report Status PENDING   Incomplete    Medical History: Past Medical History  Diagnosis Date  . Fibromyalgia   . 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 Arthritis   . Rheumatoid arthritis(714.0)     autoimmune arthritis; methotrexate once/week  . Depression     takes meds daily  . Post-nasal drip     hx of  . Other specified rheumatoid arthritis, right shoulder 08/01/2011  . Asthma   . Allergy   . Chronic kidney disease   . Urinary incontinence   . Family history of malignant neoplasm of breast   . Chronic pain   . Cellulitis of breast 11/2013    RIGHT BREAST    Medications:  Scheduled:  . amphetamine-dextroamphetamine  30 mg Oral Q24H  . amphetamine-dextroamphetamine  60 mg Oral QAC breakfast  . docusate sodium  100 mg Oral BID  .  fentaNYL  50 mcg Transdermal Q72H  . vancomycin (VANCOCIN) 750 mg IVPB  750 mg Intravenous Q12H   Infusions:  . sodium chloride 1,000 mL (01/15/14 0307)   Assessment: 73 yoF s/p breast implant removal 8/15.  Developed cellulitis admitted to [facility] d/c 10/18 on Augmentin and switched to Doxy- both of which she was unable to tolerate due to nausea.  Now with worsening swelling.  Vancomycin per Rx. Sx consult in am/repeat ultrasound to clarify if abscess noted.   Goal of Therapy:  Vancomycin trough level 10-15 mcg/ml If + for abscess may need to aim for higher goal  Plan:   Vancomycin 1Gm x1 in ED then 750mg  IV q12h  F/U SCr/cultures/levels/abcess?     R 01/15/2014,3:09 AM

## 2014-01-15 NOTE — Consult Note (Signed)
Patient interviewed and examined, agree with PA note above. She has a small draining sinus along the radial incision in the inferior breast with a small area of fluctuance palpable beneath this consistent with a fluid collection seen on sonogram. I discussed with the patient continuing current management with wound care and antibiotics versus opening the wound and debriding in exploring for any necrotic tissue. We would leave the wound packed to allow it to heal secondarily. As this is been going on for quite some time without apparent improvement I think this is reasonable and she would prefer to go ahead with the surgery. We will schedule for tomorrow morning.  Mariella Saa MD, FACS  01/15/2014 3:25 PM

## 2014-01-15 NOTE — Consult Note (Signed)
[facility] Consult Note  Kara [name] Aug 19, 1955  [id number].    Requesting MD: Dr. Grandville Silos Chief Complaint/Reason for Consult: Right breast cellulitis  HPI:  58 y/o white female with chronic problems include h/o RA on Humira, kidney stones, chronic back pain, fibromyalgia. Recent history is that in August she had bilateral breast implant removal by Dr. Towanda Malkin.  She had them placed in 2003 and decided to have them removed secondary to worsening back pain and concern for breast cancer in the family.  Around 12/13/13 she noticed a small eschar from her incision site and this apparently became infected. She takes Humira per rheumatology and so is at increased risk of infection. She had developed some progressive cellulitis changes initially treated with outpatient antibiotics. Her condition worsens she was admitted on 10/16 through 10/18 with cellulitis of the right breast. Blood cultures negative. Patient was treated with IV vancomycin and Zosyn. Ultrasound revealed no evidence for abscess but suspicion for collapsed seroma. She had Penrose drain (placed by Dr. Towanda Malkin) which was pulled prior to discharge.   She was improved at discharge (02/11/14) and sent home on doxycycline and Augmentin with plan for 10 days more of oral antibiotics. She developed some nausea and vomiting couple days ago and stopped both medications yesterday. She was unable to tolerate it even with food and antiemetics.  Occasional diarrhea as well. She is reluctant to go back to plastic surgeon at this point.  She noted worsening swelling, erythema, and hardness over the medial lower aspect of the right breast.  She said the area where the drain was placed has not healed over and continues to drain bloody cloudy drainage.  There is also drainage from her inferior 6 o'clock position of the breast.  She denies foul smell.  +feverish/chills.  No leukocytosis or fever recorded in the hospital.  No radiating pain, no  alleviating factors.  Patient is frustrated and has been missing a lot of work.  Would like a general surgeon to evaluate this.  ROS: All systems reviewed and otherwise negative except for as above  Family History  Problem Relation Age of Onset  . Arthritis Mother   . Heart disease Mother     ?psvt  . Breast cancer Mother 42    TAH/BSO  . COPD Father   . Hypertension Father   . Breast cancer Maternal Aunt 27    deceased  . Cancer Cousin 37    female; unknown primary  . Colon cancer Paternal Aunt 14    deceased at 35  . Stomach cancer Paternal Uncle 68    deceased at 14    Past Medical History  Diagnosis Date  . Fibromyalgia   . 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 Arthritis   . Rheumatoid arthritis(714.0)     autoimmune arthritis; methotrexate once/week  . Depression     takes meds daily  . Post-nasal drip     hx of  . Other specified rheumatoid arthritis, right shoulder 08/01/2011  . Asthma   . Allergy   . Chronic kidney disease   . Urinary incontinence   . Family history of malignant neoplasm of breast   . Chronic pain   . Cellulitis of breast 11/2013    RIGHT BREAST    Past Surgical History  Procedure Laterality Date  . Shoulder surgery  2012    right  . Back surgery  2011    lower back fusion l4-5,  s1  . Bladder surgery  2009  . Tubal ligation  1988  . Breast enhancement surgery  2003  . Liposuction  2003    abdominal  . Tonsillectomy  1987  . Total shoulder arthroplasty  08/01/2011    Procedure: TOTAL SHOULDER ARTHROPLASTY;  Surgeon: Johnny Bridge, MD;  Location: [place];  Service: Orthopedics;  Laterality: Right;  Right total shoulder arthroplasty  . Mass excision  11/03/2011    Procedure: MINOR EXCISION OF MASS;  Surgeon: Cammie Sickle., MD;  Location: [place];  Service: Orthopedics;  Laterality: Left;  debride IP joint, cyst excision left index  . Breast surgery  10/2013    REMOVAL OF BREAST  IMPLANTS  . Breast implants removed      Social History:  reports that she has never smoked. She has never used smokeless tobacco. She reports that she does not drink alcohol or use illicit drugs.  Allergies:  Allergies  Allergen Reactions  . Aspirin Anaphylaxis  . Ibuprofen Anaphylaxis  . Ativan [Lorazepam] Other (See Comments)    Makes agitated, combative   . Tramadol Hcl Itching and Rash    Medications Prior to Admission  Medication Sig Dispense Refill  . ALPRAZolam (XANAX) 0.5 MG tablet Take 0.5 mg by mouth 3 (three) times daily as needed for anxiety.       [name] amphetamine-dextroamphetamine (ADDERALL) 30 MG tablet Take 30 mg by mouth 3 (three) times daily. 60 mg in Am and 30 mg at 4PM      . fentaNYL (DURAGESIC - DOSED MCG/HR) 50 MCG/HR Place 50 mcg onto the skin every 3 (three) days.      [name] HYDROmorphone (DILAUDID) 2 MG tablet Take 2-4 mg by mouth every 4 (four) hours as needed for severe pain.       . meloxicam (MOBIC) 15 MG tablet Take 15 mg by mouth daily.       . ondansetron (ZOFRAN ODT) 8 MG disintegrating tablet Take 1 tablet (8 mg total) by mouth every 8 (eight) hours as needed for nausea or vomiting.  20 tablet  0  . oxyCODONE-acetaminophen (PERCOCET) 10-325 MG per tablet Take 1 tablet by mouth every 8 (eight) hours as needed for pain.  4 tablet  0  . zolpidem (AMBIEN) 10 MG tablet Take 10 mg by mouth at bedtime as needed for sleep.         Blood pressure 110/62, pulse 77, temperature 98.9 F (37.2 C), temperature source Oral, resp. rate 18, height '5\' 4"'  (1.626 m), weight 146 lb (66.225 kg), SpO2 98.00%. Physical Exam: General: pleasant, WD/WN white female who is laying in bed in NAD HEENT: head is normocephalic, atraumatic.  Sclera are noninjected.  PERRL.  Ears and nose without any masses or lesions.  Mouth is pink and moist Heart: regular, rate, and rhythm.  No obvious murmurs, gallops, or rubs noted.  Palpable pedal pulses bilaterally Lungs: CTAB, no wheezes,  rhonchi, or rales noted.  Respiratory effort non-labored Breasts:  Left breast normal with healing scars, small tiny 75m eschar without erythema, edema, minimal tenderness near inferior scarring.  Right breast near the medial inferior quadrant is very hard and edematous, firm are of fluctuance over the inferior aspect of the breast.  365mround punctum which is draining cloudy sanguinous drainage.  When palpating the hard area of the medial lower quadrant pus is expressed from the punctum (where the penrose drain was previously in place. Abd: soft, NT/ND, +BS, no masses, hernias, or  organomegaly MS: all 4 extremities are symmetrical with no cyanosis, clubbing, or edema. Skin: warm and dry with no masses, lesions, or rashes Psych: A&Ox3 with an appropriate affect.   Results for orders placed during the hospital encounter of 01/14/14 (from the past 48 hour(s))  CBC WITH DIFFERENTIAL     Status: Abnormal   Collection Time    01/14/14 11:50 PM      Result Value Ref Range   WBC 9.4  4.0 - 10.5 K/uL   RBC 4.62  3.87 - 5.11 MIL/uL   Hemoglobin 12.5  12.0 - 15.0 g/dL   HCT 38.4  36.0 - 46.0 %   MCV 83.1  78.0 - 100.0 fL   MCH 27.1  26.0 - 34.0 pg   MCHC 32.6  30.0 - 36.0 g/dL   RDW 13.1  11.5 - 15.5 %   Platelets 594 (*) 150 - 400 K/uL   Neutrophils Relative % 41 (*) 43 - 77 %   Neutro Abs 3.8  1.7 - 7.7 K/uL   Lymphocytes Relative 50 (*) 12 - 46 %   Lymphs Abs 4.6 (*) 0.7 - 4.0 K/uL   Monocytes Relative 7  3 - 12 %   Monocytes Absolute 0.7  0.1 - 1.0 K/uL   Eosinophils Relative 2  0 - 5 %   Eosinophils Absolute 0.2  0.0 - 0.7 K/uL   Basophils Relative 0  0 - 1 %   Basophils Absolute 0.0  0.0 - 0.1 K/uL  BASIC METABOLIC PANEL     Status: Abnormal   Collection Time    01/14/14 11:50 PM      Result Value Ref Range   Sodium 139  137 - 147 mEq/L   Potassium 3.8  3.7 - 5.3 mEq/L   Chloride 100  96 - 112 mEq/L   CO2 24  19 - 32 mEq/L   Glucose, Bld 105 (*) 70 - 99 mg/dL   BUN 18  6 - 23  mg/dL   Creatinine, Ser 0.64  0.50 - 1.10 mg/dL   Calcium 10.0  8.4 - 10.5 mg/dL   GFR calc non Af Amer >90  >90 mL/min   GFR calc Af Amer >90  >90 mL/min   Comment: (NOTE)     The eGFR has been calculated using the CKD EPI equation.     This calculation has not been validated in all clinical situations.     eGFR's persistently <90 mL/min signify possible Chronic Kidney     Disease.   Anion gap 15  5 - 15  MAGNESIUM     Status: None   Collection Time    01/15/14  5:25 AM      Result Value Ref Range   Magnesium 2.1  1.5 - 2.5 mg/dL  PHOSPHORUS     Status: None   Collection Time    01/15/14  5:25 AM      Result Value Ref Range   Phosphorus 4.0  2.3 - 4.6 mg/dL  COMPREHENSIVE METABOLIC PANEL     Status: Abnormal   Collection Time    01/15/14  5:25 AM      Result Value Ref Range   Sodium 140  137 - 147 mEq/L   Potassium 3.6 (*) 3.7 - 5.3 mEq/L   Chloride 103  96 - 112 mEq/L   CO2 27  19 - 32 mEq/L   Glucose, Bld 115 (*) 70 - 99 mg/dL   BUN 18  6 - 23 mg/dL  Creatinine, Ser 0.62  0.50 - 1.10 mg/dL   Calcium 9.1  8.4 - 10.5 mg/dL   Total Protein 6.6  6.0 - 8.3 g/dL   Albumin 3.3 (*) 3.5 - 5.2 g/dL   AST 14  0 - 37 U/L   ALT 13  0 - 35 U/L   Alkaline Phosphatase 120 (*) 39 - 117 U/L   Total Bilirubin <0.2 (*) 0.3 - 1.2 mg/dL   GFR calc non Af Amer >90  >90 mL/min   GFR calc Af Amer >90  >90 mL/min   Comment: (NOTE)     The eGFR has been calculated using the CKD EPI equation.     This calculation has not been validated in all clinical situations.     eGFR's persistently <90 mL/min signify possible Chronic Kidney     Disease.   Anion gap 10  5 - 15  CBC     Status: Abnormal   Collection Time    01/15/14  5:25 AM      Result Value Ref Range   WBC 7.9  4.0 - 10.5 K/uL   RBC 4.15  3.87 - 5.11 MIL/uL   Hemoglobin 11.0 (*) 12.0 - 15.0 g/dL   HCT 34.8 (*) 36.0 - 46.0 %   MCV 83.9  78.0 - 100.0 fL   MCH 26.5  26.0 - 34.0 pg   MCHC 31.6  30.0 - 36.0 g/dL   RDW 13.2  11.5 -  15.5 %   Platelets 498 (*) 150 - 400 K/uL   No results found.    Assessment/Plan Right breast cellulitis/abscess H/o implant removal RA on Humira Chronic back pain - on fentanyl patch  Plan: 1.  Will discuss with Dr. Excell Seltzer, but I feel an abscess/seroma, US shows small 1 x 3cm abscess with phlegmon.  Will plan on incision, drainage, debridement of right breast tomorrow. 2.  NPO after MN,  IVF, pain control, IV antibiotics (Vancomycin Day #2), have asked nurses to obtain wound culture of drainage as none has been obtained 3.  Her case is complicated secondary to her RA on Humira.  She has not taken this for >2 weeks.   Coralie Keens, [facility] Surgery 01/15/2014, 11:58 AM Pager: [phone]

## 2014-01-16 ENCOUNTER — Encounter (HOSPITAL_COMMUNITY)
Admission: EM | Disposition: A | Discharge status: Home Health | Payer: Self-pay | Source: Home / Self Care | Attending: Internal Medicine

## 2014-01-16 ENCOUNTER — Encounter (HOSPITAL_COMMUNITY): Payer: Self-pay | Admitting: Certified Registered Nurse Anesthetist

## 2014-01-16 ENCOUNTER — Inpatient Hospital Stay (HOSPITAL_COMMUNITY): Payer: Managed Care, Other (non HMO) | Admitting: Certified Registered Nurse Anesthetist

## 2014-01-16 ENCOUNTER — Encounter (HOSPITAL_COMMUNITY): Payer: Managed Care, Other (non HMO) | Admitting: Certified Registered Nurse Anesthetist

## 2014-01-16 DIAGNOSIS — G894 Chronic pain syndrome: Secondary | ICD-10-CM

## 2014-01-16 DIAGNOSIS — F329 Major depressive disorder, single episode, unspecified: Secondary | ICD-10-CM

## 2014-01-16 HISTORY — PX: INCISION AND DRAINAGE ABSCESS: SHX5864

## 2014-01-16 LAB — CBC WITH DIFFERENTIAL/PLATELET
BASOS ABS: 0 10*3/uL (ref 0.0–0.1)
Basophils Relative: 0 % (ref 0–1)
EOS PCT: 4 % (ref 0–5)
Eosinophils Absolute: 0.3 10*3/uL (ref 0.0–0.7)
HCT: 33.3 % — ABNORMAL LOW (ref 36.0–46.0)
Hemoglobin: 10.7 g/dL — ABNORMAL LOW (ref 12.0–15.0)
LYMPHS PCT: 56 % — AB (ref 12–46)
Lymphs Abs: 3.4 10*3/uL (ref 0.7–4.0)
MCH: 27 pg (ref 26.0–34.0)
MCHC: 32.1 g/dL (ref 30.0–36.0)
MCV: 84.1 fL (ref 78.0–100.0)
MONO ABS: 0.4 10*3/uL (ref 0.1–1.0)
Monocytes Relative: 7 % (ref 3–12)
Neutro Abs: 2 10*3/uL (ref 1.7–7.7)
Neutrophils Relative %: 33 % — ABNORMAL LOW (ref 43–77)
Platelets: 471 10*3/uL — ABNORMAL HIGH (ref 150–400)
RBC: 3.96 MIL/uL (ref 3.87–5.11)
RDW: 13.2 % (ref 11.5–15.5)
WBC: 6.1 10*3/uL (ref 4.0–10.5)

## 2014-01-16 LAB — BASIC METABOLIC PANEL
Anion gap: 9 (ref 5–15)
BUN: 8 mg/dL (ref 6–23)
CHLORIDE: 105 meq/L (ref 96–112)
CO2: 26 meq/L (ref 19–32)
CREATININE: 0.64 mg/dL (ref 0.50–1.10)
Calcium: 8.7 mg/dL (ref 8.4–10.5)
GFR calc Af Amer: >90 mL/min (ref >90)
GFR calc non Af Amer: >90 mL/min (ref >90)
Glucose, Bld: 88 mg/dL (ref 70–99)
Potassium: 4.3 mEq/L (ref 3.7–5.3)
Sodium: 140 mEq/L (ref 137–147)

## 2014-01-16 LAB — SURGICAL PCR SCREEN
MRSA, PCR: NEGATIVE
Staphylococcus aureus: NEGATIVE

## 2014-01-16 LAB — PROTIME-INR
INR: 1.08 (ref 0.00–1.49)
Prothrombin Time: 14.1 seconds (ref 11.6–15.2)

## 2014-01-16 SURGERY — INCISION AND DRAINAGE, ABSCESS
Anesthesia: General | Laterality: Right

## 2014-01-16 MED ORDER — ONDANSETRON HCL 4 MG/2ML IJ SOLN
INTRAMUSCULAR | Status: AC
  Filled 2014-01-16: qty 2

## 2014-01-16 MED ORDER — LIDOCAINE HCL (CARDIAC) 20 MG/ML IV SOLN
INTRAVENOUS | Status: DC | PRN
  Administered 2014-01-16: 75 mg via INTRAVENOUS

## 2014-01-16 MED ORDER — FENTANYL CITRATE 0.05 MG/ML IJ SOLN
INTRAMUSCULAR | Status: AC
  Filled 2014-01-16: qty 5

## 2014-01-16 MED ORDER — PHENYLEPHRINE HCL 10 MG/ML IJ SOLN
INTRAMUSCULAR | Status: DC | PRN
  Administered 2014-01-16 (×6): 40 ug via INTRAVENOUS

## 2014-01-16 MED ORDER — LACTATED RINGERS IV SOLN
INTRAVENOUS | Status: DC | PRN
  Administered 2014-01-16: 11:00:00 via INTRAVENOUS

## 2014-01-16 MED ORDER — FENTANYL CITRATE 0.05 MG/ML IJ SOLN
INTRAMUSCULAR | Status: DC | PRN
  Administered 2014-01-16 (×2): 50 ug via INTRAVENOUS
  Administered 2014-01-16: 100 ug via INTRAVENOUS
  Administered 2014-01-16: 50 ug via INTRAVENOUS

## 2014-01-16 MED ORDER — LIDOCAINE HCL (CARDIAC) 20 MG/ML IV SOLN
INTRAVENOUS | Status: AC
  Filled 2014-01-16: qty 5

## 2014-01-16 MED ORDER — ONDANSETRON HCL 4 MG/2ML IJ SOLN
INTRAMUSCULAR | Status: DC | PRN
  Administered 2014-01-16 (×2): 2 mg via INTRAVENOUS

## 2014-01-16 MED ORDER — LACTATED RINGERS IV SOLN
INTRAVENOUS | Status: DC
  Administered 2014-01-16: 1000 mL via INTRAVENOUS

## 2014-01-16 MED ORDER — SODIUM CHLORIDE 0.9 % IV SOLN: 1000.0000 mL | INTRAVENOUS | Status: DC

## 2014-01-16 MED ORDER — EPHEDRINE SULFATE 50 MG/ML IJ SOLN
INTRAMUSCULAR | Status: DC | PRN
  Administered 2014-01-16 (×6): 5 mg via INTRAVENOUS

## 2014-01-16 MED ORDER — LACTATED RINGERS IV SOLN: INTRAVENOUS | Status: DC

## 2014-01-16 MED ORDER — DEXAMETHASONE SODIUM PHOSPHATE 10 MG/ML IJ SOLN
INTRAMUSCULAR | Status: DC | PRN
  Administered 2014-01-16: 10 mg via INTRAVENOUS

## 2014-01-16 MED ORDER — DEXAMETHASONE SODIUM PHOSPHATE 10 MG/ML IJ SOLN
INTRAMUSCULAR | Status: AC
  Filled 2014-01-16: qty 1

## 2014-01-16 MED ORDER — ALPRAZOLAM 0.5 MG PO TABS
0.5000 mg | ORAL_TABLET | Freq: Two times a day (BID) | ORAL | Status: DC | PRN
  Administered 2014-01-17 – 2014-01-18 (×2): 0.5 mg via ORAL
  Filled 2014-01-16 (×2): qty 1

## 2014-01-16 MED ORDER — MIDAZOLAM HCL 5 MG/5ML IJ SOLN
INTRAMUSCULAR | Status: DC | PRN
  Administered 2014-01-16 (×2): 1 mg via INTRAVENOUS

## 2014-01-16 MED ORDER — PROPOFOL 10 MG/ML IV BOLUS
INTRAVENOUS | Status: DC | PRN
  Administered 2014-01-16: 175 mg via INTRAVENOUS

## 2014-01-16 MED ORDER — HYDROMORPHONE HCL 1 MG/ML IJ SOLN
1.0000 mg | Freq: Once | INTRAMUSCULAR | Status: AC
  Administered 2014-01-16: 1 mg via INTRAVENOUS
  Filled 2014-01-16: qty 1

## 2014-01-16 MED ORDER — HYDROMORPHONE HCL 2 MG/ML IJ SOLN
2.0000 mg | INTRAMUSCULAR | Status: DC | PRN
  Administered 2014-01-16 – 2014-01-17 (×3): 2 mg via INTRAVENOUS
  Filled 2014-01-16 (×4): qty 1

## 2014-01-16 MED ORDER — HYDROMORPHONE HCL 1 MG/ML IJ SOLN
0.2500 mg | INTRAMUSCULAR | Status: DC | PRN
  Administered 2014-01-16 (×2): 0.5 mg via INTRAVENOUS

## 2014-01-16 MED ORDER — PROPOFOL 10 MG/ML IV BOLUS
INTRAVENOUS | Status: AC
  Filled 2014-01-16: qty 20

## 2014-01-16 MED ORDER — HYDROMORPHONE HCL 1 MG/ML IJ SOLN
INTRAMUSCULAR | Status: AC
  Filled 2014-01-16: qty 1

## 2014-01-16 MED ORDER — 0.9 % SODIUM CHLORIDE (POUR BTL) OPTIME
TOPICAL | Status: DC | PRN
  Administered 2014-01-16: 1000 mL

## 2014-01-16 MED ORDER — BUPIVACAINE-EPINEPHRINE (PF) 0.25% -1:200000 IJ SOLN
INTRAMUSCULAR | Status: AC
  Filled 2014-01-16: qty 30

## 2014-01-16 MED ORDER — BUPIVACAINE-EPINEPHRINE 0.25% -1:200000 IJ SOLN
INTRAMUSCULAR | Status: DC | PRN
  Administered 2014-01-16: 20 mL

## 2014-01-16 MED ORDER — HYDROMORPHONE HCL 1 MG/ML IJ SOLN
1.0000 mg | INTRAMUSCULAR | Status: DC | PRN
  Administered 2014-01-16: 1 mg via INTRAVENOUS
  Filled 2014-01-16: qty 1

## 2014-01-16 MED ORDER — MIDAZOLAM HCL 2 MG/2ML IJ SOLN
INTRAMUSCULAR | Status: AC
  Filled 2014-01-16: qty 2

## 2014-01-16 MED ORDER — LACTATED RINGERS IV SOLN
INTRAVENOUS | Status: DC | PRN
  Administered 2014-01-16: 10:00:00 via INTRAVENOUS

## 2014-01-16 SURGICAL SUPPLY — 30 items
BLADE SURG 15 STRL LF DISP TIS (BLADE) ×1 IMPLANT
BLADE SURG 15 STRL SS (BLADE) ×1
BNDG GAUZE ELAST 4 BULKY (GAUZE/BANDAGES/DRESSINGS) IMPLANT
CANISTER SUCTION 2500CC (MISCELLANEOUS) ×2 IMPLANT
COVER SURGICAL LIGHT HANDLE (MISCELLANEOUS) ×2 IMPLANT
DECANTER SPIKE VIAL GLASS SM (MISCELLANEOUS) IMPLANT
DRAPE LAPAROSCOPIC ABDOMINAL (DRAPES) ×2 IMPLANT
DRSG PAD ABDOMINAL 8X10 ST (GAUZE/BANDAGES/DRESSINGS) IMPLANT
ELECT REM PT RETURN 9FT ADLT (ELECTROSURGICAL) ×2
ELECTRODE REM PT RTRN 9FT ADLT (ELECTROSURGICAL) ×1 IMPLANT
GAUZE IODOFORM PACK 1/2 7832 (GAUZE/BANDAGES/DRESSINGS) ×2 IMPLANT
GAUZE SPONGE 4X4 12PLY STRL (GAUZE/BANDAGES/DRESSINGS) IMPLANT
GLOVE BIO SURGEON STRL SZ7.5 (GLOVE) ×2 IMPLANT
GOWN STRL REUS W/TWL LRG LVL3 (GOWN DISPOSABLE) ×4 IMPLANT
KIT BASIN OR (CUSTOM PROCEDURE TRAY) ×2 IMPLANT
NEEDLE HYPO 25X1 1.5 SAFETY (NEEDLE) ×2 IMPLANT
NS IRRIG 1000ML POUR BTL (IV SOLUTION) ×2 IMPLANT
PACK GENERAL/GYN (CUSTOM PROCEDURE TRAY) ×2 IMPLANT
PENCIL BUTTON HOLSTER BLD 10FT (ELECTRODE) ×2 IMPLANT
SPONGE LAP 18X18 X RAY DECT (DISPOSABLE) IMPLANT
SUT MNCRL AB 4-0 PS2 18 (SUTURE) IMPLANT
SUT VIC AB 3-0 SH 27 (SUTURE)
SUT VIC AB 3-0 SH 27XBRD (SUTURE) IMPLANT
SWAB COLLECTION DEVICE MRSA (MISCELLANEOUS) ×2 IMPLANT
SYR BULB 3OZ (MISCELLANEOUS) IMPLANT
SYR CONTROL 10ML LL (SYRINGE) ×2 IMPLANT
TOWEL OR 17X26 10 PK STRL BLUE (TOWEL DISPOSABLE) ×2 IMPLANT
TUBE ANAEROBIC SPECIMEN COL (MISCELLANEOUS) ×2 IMPLANT
WATER STERILE IRR 1000ML POUR (IV SOLUTION) IMPLANT
YANKAUER SUCT BULB TIP NO VENT (SUCTIONS) ×2 IMPLANT

## 2014-01-16 NOTE — Progress Notes (Signed)
Patient came back to unit , s/p incision and drainage R breast abscess,ice pack intact.Patient c/o severe pain and requesting Dilaudid IV. Dr. Janee Morn notified,new order to give Dilaudid 1 mg IV every 4 hours PRN for sever pain. Will continue to monitor the patient.Hulda Marin RN

## 2014-01-16 NOTE — Anesthesia Procedure Notes (Signed)
Procedure Name: LMA Insertion Date/Time: 01/16/2014 10:54 AM Performed by: Edison Pace Pre-anesthesia Checklist: Patient identified, Patient being monitored, Timeout performed, Emergency Drugs available and Suction available Patient Re-evaluated:Patient Re-evaluated prior to inductionOxygen Delivery Method: Circle system utilized Preoxygenation: Pre-oxygenation with 100% oxygen Intubation Type: IV induction LMA: LMA inserted LMA Size: 4.0 Number of attempts: 1 Placement Confirmation: positive ETCO2 Tube secured with: Tape Dental Injury: Teeth and Oropharynx as per pre-operative assessment

## 2014-01-16 NOTE — Transfer of Care (Signed)
Immediate Anesthesia Transfer of Care Note  Patient: Kara [name]  Procedure(s) Performed: Procedure(s): INCISION AND DRAINAGE AND OF RIGHT BREAST ABCESS (Right)  Patient Location: PACU  Anesthesia Type:General  Level of Consciousness: awake, alert , oriented and patient cooperative  Airway & Oxygen Therapy: Patient Spontanous Breathing and Patient connected to face mask oxygen  Post-op Assessment: Report given to PACU RN, Post -op Vital signs reviewed and stable and Patient moving all extremities  Post vital signs: Reviewed and stable  Complications: No apparent anesthesia complications

## 2014-01-16 NOTE — Anesthesia Preprocedure Evaluation (Signed)
Anesthesia Evaluation  Patient identified by MRN, date of birth, ID band Patient awake    Reviewed: Allergy & Precautions, H&P , NPO status , Patient's Chart, lab work & pertinent test results  Airway Mallampati: I TM Distance: >3 FB Neck ROM: full    Dental no notable dental hx. (+) Dental Advidsory Given, Teeth Intact   Pulmonary former smoker,  breath sounds clear to auscultation  Pulmonary exam normal       Cardiovascular Exercise Tolerance: Good negative cardio ROS  Rhythm:regular Rate:Normal     Neuro/Psych PSYCHIATRIC DISORDERS  Neuromuscular disease negative neurological ROS  negative psych ROS   GI/Hepatic negative GI ROS, Neg liver ROS,   Endo/Other  negative endocrine ROS  Renal/GU negative Renal ROS  negative genitourinary   Musculoskeletal  (+) Arthritis -, Rheumatoid disorders,  Fibromyalgia -  Abdominal   Peds  Hematology negative hematology ROS (+) anemia ,   Anesthesia Other Findings Breast cancer. Aspirin allergy where throat swells.  Reproductive/Obstetrics negative OB ROS                           Anesthesia Physical Anesthesia Plan  ASA: II  Anesthesia Plan: General   Post-op Pain Management:    Induction: Intravenous  Airway Management Planned: LMA  Additional Equipment:   Intra-op Plan:   Post-operative Plan:   Informed Consent: I have reviewed the patients History and Physical, chart, labs and discussed the procedure including the risks, benefits and alternatives for the proposed anesthesia with the patient or authorized representative who has indicated his/her understanding and acceptance.   Dental Advisory Given  Plan Discussed with: CRNA and Surgeon  Anesthesia Plan Comments:         Anesthesia Quick Evaluation

## 2014-01-16 NOTE — Op Note (Signed)
Preoperative Diagnosis: Postoperative right breast abscess/cellulitis  Postoprative Diagnosis: Same  Procedure: Procedure(s): INCISION AND DRAINAGE AND OF RIGHT BREAST ABCESS   Surgeon: Glenna Fellows T   Assistants: None  Anesthesia:  General LMA anesthesia  Indications: Patient is a 58 year old female approximately one month following  Bilateral removal of breast implants and a lift procedure. She has had some persistent drainage and cellulitis in the inferior right breast. A JP drain had been previously placed in a seroma and removed. She presents with persistent drainage of cloudy serous fluid with erythema and induration over the inferior right breast. She has been on several courses of antibiotics without improvement. Ultrasound showed a small fluid collection and she does have an area of superficial fluctuance. I discussed options with the patient detailed elsewhere we elect to proceed with incision and drainage and possible debridement under general anesthesia in the operating room.    Procedure Detail:  Patient was brought to the operating room, placed in the supine position on the operating table, and laryngeal mask general anesthesia induced. She was already on IV antibiotics. The right breast was widely sterilely prepped and draped. Patient timeout was performed and correct procedure verified. PAS replaced. There was a 6 open sinus tract along the vertical incision in the inferior right breast. I incised through this and made about a 3 cm in incision incorporating the sinus tract. This tracked back into a several centimeter cavity in the superficial breast tissue but then also another sinus track back toward the chest wall. All this was widely opened with cautery and blunt dissection. Some thin purulent material was drained and cultured. This continued to track into a several centimeter cavity behind the breast inferiorly against the chest wall. I made a counterincision in the right  inframammary crease about 2 cm and dissected down onto the chest wall and into this cavity inferiorly. I did not see any necrotic tissue. The wound was thoroughly irrigated. I then packed the wound and cavity opened from both directions with 1/2 inch iodoform gauze. There was no bleeding. Sponge needle and instrument counts were correct.    Findings: As above  Estimated Blood Loss:  Minimal         Drains: packed with 1/2 inch iodoform gauze  Blood Given: none          Specimens: Culture and sensitivity        Complications:  * No complications entered in OR log *         Disposition: PACU - hemodynamically stable.         Condition: stable

## 2014-01-16 NOTE — Anesthesia Postprocedure Evaluation (Signed)
  Anesthesia Post-op Note  Patient: Kara [name]  Procedure(s) Performed: Procedure(s) (LRB): INCISION AND DRAINAGE AND OF RIGHT BREAST ABCESS (Right)  Patient Location: PACU  Anesthesia Type: General  Level of Consciousness: awake and alert   Airway and Oxygen Therapy: Patient Spontanous Breathing  Post-op Pain: mild  Post-op Assessment: Post-op Vital signs reviewed, Patient's Cardiovascular Status Stable, Respiratory Function Stable, Patent Airway and No signs of Nausea or vomiting  Last Vitals:  Filed Vitals:   01/16/14 1226  BP: 116/69  Pulse: 82  Temp: 36.8 C  Resp: 18    Post-op Vital Signs: stable   Complications: No apparent anesthesia complications

## 2014-01-16 NOTE — Progress Notes (Signed)
[facility] PROGRESS NOTE  Kara [name] GGY:[id number] DOB: 10-04-1955 DOA: 01/14/2014 PCP: [name], MD  Assessment/Plan: #1 postoperative right breast abscess/cellulitis Status post incision and drainage of right breast abscess. Wound cultures have been sent off. Patient currently afebrile. Patient when some pain in the right breast. Continue empiric IV vancomycin. Pain management. General surgery following and appreciate input and recommendations.   #2 rheumatoid arthritis Immunosuppressive medications on hold secondary to problem #1. Outpatient followup.  #3 depression/anxiety Stable. Continue Xanax as needed.  #4 chronic kidney disease Stable.  #5 chronic pain Continue home pain regimen  #6 prophylaxis SCDs for DVT prophylaxis.   Code Status: Full Family Communication: Updated patient at bedside. Disposition Plan: Home when medically stable.   Consultants:  General surgery: Dr. Johna Sheriff 01/15/2014  Procedures:  Ultrasound of the right breast 01/15/2014  Incision and drainage of right breast abscess/cellulitis  Antibiotics:  IV vancomycin 01/15/2014  HPI/Subjective: Patient complaining of right breast pain postop.  Objective: Filed Vitals:   01/16/14 1813  BP: 132/80  Pulse: 94  Temp: 98.7 F (37.1 C)  Resp: 16    Intake/Output Summary (Last 24 hours) at 01/16/14 1815 Last data filed at 01/16/14 1600  Gross per 24 hour  Intake 4159.17 ml  Output      0 ml  Net 4159.17 ml   Filed Weights   01/14/14 2118  Weight: 66.225 kg (146 lb)    Exam:   General:  nad  Cardiovascular: RRR  Respiratory: CTAB  Abdomen: Soft, nontender, nondistended, positive bowel sounds.  Musculoskeletal: No clubbing cyanosis or edema   Data Reviewed: Basic Metabolic Panel:  Recent Labs Lab 01/10/14 0506 01/14/14 2350 01/15/14 0525 01/16/14 0801  NA 144 139 140 140  K 3.9 3.8 3.6* 4.3  CL 107 100 103 105  CO2 25 24 27 26   GLUCOSE 86  105* 115* 88  BUN 15 18 18 8   CREATININE 0.69 0.64 0.62 0.64  CALCIUM 8.9 10.0 9.1 8.7  MG  --   --  2.1  --   PHOS  --   --  4.0  --    Liver Function Tests:  Recent Labs Lab 01/10/14 0506 01/15/14 0525  AST 13 14  ALT 12 13  ALKPHOS 158* 120*  BILITOT <0.2* <0.2*  PROT 6.9 6.6  ALBUMIN 2.9* 3.3*   No results found for this basename: LIPASE, AMYLASE,  in the last 168 hours No results found for this basename: AMMONIA,  in the last 168 hours CBC:  Recent Labs Lab 01/10/14 0506 01/14/14 2350 01/15/14 0525 01/16/14 0801  WBC 11.2* 9.4 7.9 6.1  NEUTROABS  --  3.8  --  2.0  HGB 11.1* 12.5 11.0* 10.7*  HCT 33.7* 38.4 34.8* 33.3*  MCV 83.2 83.1 83.9 84.1  PLT 445* 594* 498* 471*   Cardiac Enzymes:  Recent Labs Lab 01/09/14 2007  TROPONINI <0.30   BNP (last 3 results) No results found for this basename: PROBNP,  in the last 8760 hours CBG: No results found for this basename: GLUCAP,  in the last 168 hours  Recent Results (from the past 240 hour(s))  CULTURE, BLOOD (ROUTINE X 2)     Status: None   Collection Time    01/09/14 11:25 AM      Result Value Ref Range Status   Specimen Description BLOOD RIGHT ANTECUBITAL   Final   Special Requests BOTTLES DRAWN AEROBIC AND ANAEROBIC 2008   Final   Culture  Setup Time  Final   Value: 01/09/2014 17:19     Performed at [facility]   Culture     Final   Value: NO GROWTH 5 DAYS     Performed at [facility]   Report Status 01/15/2014 FINAL   Final  CULTURE, BLOOD (ROUTINE X 2)     Status: None   Collection Time    01/09/14 11:35 AM      Result Value Ref Range Status   Specimen Description BLOOD RIGHT ANTECUBITAL   Final   Special Requests BOTTLES DRAWN AEROBIC AND ANAEROBIC   Final   Culture  Setup Time     Final   Value: 01/09/2014 17:18     Performed at [facility]   Culture     Final   Value: NO GROWTH 5 DAYS     Performed at [facility]   Report Status 01/15/2014  FINAL   Final  WOUND CULTURE     Status: None   Collection Time    01/15/14 12:47 PM      Result Value Ref Range Status   Specimen Description BREAST RIGHT   Final   Special Requests Immunocompromised   Final   Gram Stain     Final   Value: NO WBC SEEN     NO SQUAMOUS EPITHELIAL CELLS SEEN     NO ORGANISMS SEEN     Performed at [facility]   Culture     Final   Value: NO GROWTH 1 DAY     Performed at [facility]   Report Status PENDING   Incomplete  SURGICAL PCR SCREEN     Status: None   Collection Time    01/15/14  9:53 PM      Result Value Ref Range Status   MRSA, PCR NEGATIVE  NEGATIVE Final   Staphylococcus aureus NEGATIVE  NEGATIVE Final   Comment:            The Xpert SA Assay (FDA     approved for NASAL specimens     in patients over 58 years of age),     is one component of     a comprehensive surveillance     program.  Test performance has     been validated by [facility] for patients greater     than or equal to 46 year old.     It is not intended     to diagnose infection nor to     guide or monitor treatment.     Studies: US Breast Complete Uni Right Inc Axilla  01/15/2014   CLINICAL DATA:  58 year old female with history of right breast collection, drainage and recent drain removal. Right breast cellulitis and increasing pain.  EXAM: ULTRASOUND OF THE RIGHT BREAST  COMPARISON:  01/09/2014.  FINDINGS: Ultrasound is performed, showing a 0.6 x 1 x 3 cm ill-defined collection at the 5 o'clock position of the right breast 3-4 cm from the nipple. This has a similar appearance to the prior study, but now without a drain identified.  No other abnormalities are identified within the right breast.  IMPRESSION: 0.6 x 1 x 3 cm persistent collection within the inner lower right breast. This may represent a postoperative collection or hematoma with infection not excluded.  Recommend bilateral mammograms when clinically able to resume annual mammogram  schedule.   Electronically Signed   By: Laveda Abbe M.D.   On: 01/15/2014  13:35    Scheduled Meds: . amphetamine-dextroamphetamine  30 mg Oral Q24H  . amphetamine-dextroamphetamine  60 mg Oral QAC breakfast  . docusate sodium  100 mg Oral BID  . fentaNYL  50 mcg Transdermal Q72H  . HYDROmorphone      . vancomycin (VANCOCIN) 750 mg IVPB  750 mg Intravenous Q12H   Continuous Infusions: . sodium chloride 1,000 mL (01/16/14 1246)    Principal Problem:   Cellulitis of female breast Active Problems:   LOW BACK PAIN   History of asthma   Rheumatoid arthritis   Fibromyalgia   Depression    Time spent: 35 minutes    Eleazar Kimmey M.D. [facility] Pager [phone]. If 7PM-7AM, please contact night-coverage at [email or web address], password [facility] 01/16/2014, 6:15 PM  LOS: 2 days

## 2014-01-17 DIAGNOSIS — T814XXA Infection following a procedure, initial encounter: Secondary | ICD-10-CM

## 2014-01-17 DIAGNOSIS — S21001A Unspecified open wound of right breast, initial encounter: Secondary | ICD-10-CM

## 2014-01-17 DIAGNOSIS — M797 Fibromyalgia: Secondary | ICD-10-CM

## 2014-01-17 DIAGNOSIS — L0390 Cellulitis, unspecified: Secondary | ICD-10-CM

## 2014-01-17 DIAGNOSIS — N611 Abscess of the breast and nipple: Secondary | ICD-10-CM | POA: Diagnosis present

## 2014-01-17 LAB — CBC
HEMATOCRIT: 34.1 % — AB (ref 36.0–46.0)
Hemoglobin: 11.1 g/dL — ABNORMAL LOW (ref 12.0–15.0)
MCH: 26.9 pg (ref 26.0–34.0)
MCHC: 32.6 g/dL (ref 30.0–36.0)
MCV: 82.6 fL (ref 78.0–100.0)
PLATELETS: 540 10*3/uL — AB (ref 150–400)
RBC: 4.13 MIL/uL (ref 3.87–5.11)
RDW: 13.1 % (ref 11.5–15.5)
WBC: 11.2 10*3/uL — AB (ref 4.0–10.5)

## 2014-01-17 LAB — BASIC METABOLIC PANEL
Anion gap: 13 (ref 5–15)
BUN: 6 mg/dL (ref 6–23)
CO2: 24 meq/L (ref 19–32)
Calcium: 9.3 mg/dL (ref 8.4–10.5)
Chloride: 102 mEq/L (ref 96–112)
Creatinine, Ser: 0.53 mg/dL (ref 0.50–1.10)
GFR calc Af Amer: >90 mL/min (ref >90)
Glucose, Bld: 187 mg/dL — ABNORMAL HIGH (ref 70–99)
Potassium: 3.7 mEq/L (ref 3.7–5.3)
SODIUM: 139 meq/L (ref 137–147)

## 2014-01-17 LAB — WOUND CULTURE
Culture: NO GROWTH
GRAM STAIN: NONE SEEN

## 2014-01-17 MED ORDER — MORPHINE SULFATE 2 MG/ML IJ SOLN
2.0000 mg | INTRAMUSCULAR | Status: DC | PRN
  Administered 2014-01-17 – 2014-01-18 (×7): 2 mg via INTRAVENOUS
  Filled 2014-01-17 (×7): qty 1

## 2014-01-17 MED ORDER — HYDROMORPHONE HCL 2 MG PO TABS
2.0000 mg | ORAL_TABLET | ORAL | Status: DC | PRN
  Administered 2014-01-17 – 2014-01-19 (×5): 4 mg via ORAL
  Filled 2014-01-17 (×6): qty 2

## 2014-01-17 MED ORDER — DEXTROSE 5 % IV SOLN
2.0000 g | Freq: Two times a day (BID) | INTRAVENOUS | Status: DC
  Administered 2014-01-17 – 2014-01-20 (×7): 2 g via INTRAVENOUS
  Filled 2014-01-17 (×7): qty 2

## 2014-01-17 MED ORDER — MORPHINE SULFATE 2 MG/ML IJ SOLN
INTRAMUSCULAR | Status: AC
  Filled 2014-01-17: qty 1

## 2014-01-17 MED ORDER — OXYCODONE HCL 5 MG PO TABS
5.0000 mg | ORAL_TABLET | Freq: Three times a day (TID) | ORAL | Status: DC | PRN
  Administered 2014-01-17 – 2014-01-20 (×2): 5 mg via ORAL
  Filled 2014-01-17 (×2): qty 1

## 2014-01-17 MED ORDER — MELOXICAM 15 MG PO TABS
15.0000 mg | ORAL_TABLET | Freq: Every day | ORAL | Status: DC
  Administered 2014-01-17 – 2014-01-20 (×4): 15 mg via ORAL
  Filled 2014-01-17 (×5): qty 1

## 2014-01-17 MED ORDER — OXYCODONE HCL 5 MG PO TABS: 5.0000 mg | ORAL_TABLET | Freq: Three times a day (TID) | ORAL | Status: DC | PRN

## 2014-01-17 MED ORDER — MORPHINE SULFATE 2 MG/ML IJ SOLN
2.0000 mg | INTRAMUSCULAR | Status: DC | PRN
  Administered 2014-01-17: 2 mg via INTRAVENOUS

## 2014-01-17 MED ORDER — OXYCODONE-ACETAMINOPHEN 5-325 MG PO TABS
1.0000 | ORAL_TABLET | Freq: Three times a day (TID) | ORAL | Status: DC | PRN
  Administered 2014-01-17 – 2014-01-20 (×3): 1 via ORAL
  Filled 2014-01-17 (×3): qty 1

## 2014-01-17 MED ORDER — OXYCODONE-ACETAMINOPHEN 5-325 MG PO TABS: 2.0000 | ORAL_TABLET | Freq: Four times a day (QID) | ORAL | Status: DC | PRN

## 2014-01-17 NOTE — Progress Notes (Signed)
[facility] PROGRESS NOTE  Kara [name] JJH:[id number] DOB: [birth year] DOA: 01/14/2014 PCP: [name], MD  Assessment/Plan: #1 postoperative right breast abscess/cellulitis Status post incision and drainage of right breast abscess. Wound cultures have been sent off. Patient currently afebrile. Patient complaining of pain in the right breast, especially during dressing changes. Patient feels her pain is not controlled at this time. Continue empiric IV vancomycin. Pain management. Pain regimen has been readjusted per general surgery will monitor for now and if no significant improvement may need to readjust. General surgery following and appreciate input and recommendations. Will consult with infectious diseases for antibiotic duration and recommendation.  #2 rheumatoid arthritis Immunosuppressive medications on hold secondary to problem #1. Outpatient followup.  #3 depression/anxiety Stable. Continue Xanax as needed.  #4 chronic kidney disease Stable.  #5 chronic pain Patient was already on her chronic home pain regimen as of yesterday.   #6 prophylaxis SCDs for DVT prophylaxis.   Code Status: Full Family Communication: Updated patient at bedside. Disposition Plan: Home when medically stable.   Consultants:  General surgery: Dr. Johna Sheriff 01/15/2014  Procedures:  Ultrasound of the right breast 01/15/2014  Incision and drainage of right breast abscess/cellulitis  Antibiotics:  IV vancomycin 01/15/2014  HPI/Subjective: Patient complaining of significant right breast pain during dressing changes. Patient tearful and upset stating that pain medications were changed from what they were yesterday and she was requesting an increase in the pain regimen.  Objective: Filed Vitals:   01/17/14 1343  BP: 147/78  Pulse: 94  Temp: 98.6 F (37 C)  Resp: 18    Intake/Output Summary (Last 24 hours) at 01/17/14 1541 Last data filed at 01/17/14 1342  Gross per 24  hour  Intake 1422.5 ml  Output    700 ml  Net  722.5 ml   Filed Weights   01/14/14 2118  Weight: 66.225 kg (146 lb)    Exam:   General:  nad  Cardiovascular: RRR  Breast: Hold of right breast with moderate drainage. Less indurated and softer. Some tenderness to palpation.  Respiratory: CTAB  Abdomen: Soft, nontender, nondistended, positive bowel sounds.  Musculoskeletal: No clubbing cyanosis or edema   Data Reviewed: Basic Metabolic Panel:  Recent Labs Lab 01/14/14 2350 01/15/14 0525 01/16/14 0801 01/17/14 0557  NA 139 140 140 139  K 3.8 3.6* 4.3 3.7  CL 100 103 105 102  CO2 24 27 26 24   GLUCOSE 105* 115* 88 187*  BUN 18 18 8 6   CREATININE 0.64 0.62 0.64 0.53  CALCIUM 10.0 9.1 8.7 9.3  MG  --  2.1  --   --   PHOS  --  4.0  --   --    Liver Function Tests:  Recent Labs Lab 01/15/14 0525  AST 14  ALT 13  ALKPHOS 120*  BILITOT <0.2*  PROT 6.6  ALBUMIN 3.3*   No results found for this basename: LIPASE, AMYLASE,  in the last 168 hours No results found for this basename: AMMONIA,  in the last 168 hours CBC:  Recent Labs Lab 01/14/14 2350 01/15/14 0525 01/16/14 0801 01/17/14 0557  WBC 9.4 7.9 6.1 11.2*  NEUTROABS 3.8  --  2.0  --   HGB 12.5 11.0* 10.7* 11.1*  HCT 38.4 34.8* 33.3* 34.1*  MCV 83.1 83.9 84.1 82.6  PLT 594* 498* 471* 540*   Cardiac Enzymes: No results found for this basename: CKTOTAL, CKMB, CKMBINDEX, TROPONINI,  in the last 168 hours BNP (last 3 results) No results found for this  basename: PROBNP,  in the last 8760 hours CBG: No results found for this basename: GLUCAP,  in the last 168 hours  Recent Results (from the past 240 hour(s))  CULTURE, BLOOD (ROUTINE X 2)     Status: None   Collection Time    01/09/14 11:25 AM      Result Value Ref Range Status   Specimen Description BLOOD RIGHT ANTECUBITAL   Final   Special Requests BOTTLES DRAWN AEROBIC AND ANAEROBIC   Final   Culture  Setup Time     Final   Value:  01/09/2014 17:19     Performed at [facility]   Culture     Final   Value: NO GROWTH 5 DAYS     Performed at [facility]   Report Status 01/15/2014 FINAL   Final  CULTURE, BLOOD (ROUTINE X 2)     Status: None   Collection Time    01/09/14 11:35 AM      Result Value Ref Range Status   Specimen Description BLOOD RIGHT ANTECUBITAL   Final   Special Requests BOTTLES DRAWN AEROBIC AND ANAEROBIC   Final   Culture  Setup Time     Final   Value: 01/09/2014 17:18     Performed at [facility]   Culture     Final   Value: NO GROWTH 5 DAYS     Performed at [facility]   Report Status 01/15/2014 FINAL   Final  WOUND CULTURE     Status: None   Collection Time    01/15/14 12:47 PM      Result Value Ref Range Status   Specimen Description BREAST RIGHT   Final   Special Requests Immunocompromised   Final   Gram Stain     Final   Value: NO WBC SEEN     NO SQUAMOUS EPITHELIAL CELLS SEEN     NO ORGANISMS SEEN     Performed at [facility]   Culture     Final   Value: NO GROWTH 2 DAYS     Performed at [facility]   Report Status 01/17/2014 FINAL   Final  SURGICAL PCR SCREEN     Status: None   Collection Time    01/15/14  9:53 PM      Result Value Ref Range Status   MRSA, PCR NEGATIVE  NEGATIVE Final   Staphylococcus aureus NEGATIVE  NEGATIVE Final   Comment:            The Xpert SA Assay (FDA     approved for NASAL specimens     in patients over 81 years of age),     is one component of     a comprehensive surveillance     program.  Test performance has     been validated by [employer] for patients greater     than or equal to 38 year old.     It is not intended     to diagnose infection nor to     guide or monitor treatment.  ANAEROBIC CULTURE     Status: None   Collection Time    01/16/14 11:03 AM      Result Value Ref Range Status   Specimen Description ABSCESS RIGHT BREAST   Final   Special Requests NONE   Final    Gram Stain     Final   Value: FEW WBC PRESENT,BOTH  PMN AND MONONUCLEAR     NO SQUAMOUS EPITHELIAL CELLS SEEN     NO ORGANISMS SEEN     Performed at [facility]   Culture     Final   Value: NO ANAEROBES ISOLATED; CULTURE IN PROGRESS FOR 5 DAYS     Performed at [facility]   Report Status PENDING   Incomplete  CULTURE, ROUTINE-ABSCESS     Status: None   Collection Time    01/16/14 11:03 AM      Result Value Ref Range Status   Specimen Description ABSCESS RIGHT BREAST   Final   Special Requests NONE   Final   Gram Stain     Final   Value: FEW WBC PRESENT,BOTH PMN AND MONONUCLEAR     NO SQUAMOUS EPITHELIAL CELLS SEEN     NO ORGANISMS SEEN     Performed at [facility]   Culture     Final   Value: NO GROWTH 1 DAY     Performed at [facility]   Report Status PENDING   Incomplete     Studies: No results found.  Scheduled Meds: . amphetamine-dextroamphetamine  30 mg Oral Q24H  . amphetamine-dextroamphetamine  60 mg Oral QAC breakfast  . fentaNYL  50 mcg Transdermal Q72H  . meloxicam  15 mg Oral Daily  . morphine      . vancomycin (VANCOCIN) 750 mg IVPB  750 mg Intravenous Q12H   Continuous Infusions:    Principal Problem:   Cellulitis of female breast Active Problems:   LOW BACK PAIN   History of asthma   Rheumatoid arthritis   Fibromyalgia   Depression    Time spent: 35 minutes    THOMPSON,DANIEL M.D. [facility] Pager [phone]. If 7PM-7AM, please contact night-coverage at [email or web address], password [facility] 01/17/2014, 3:41 PM  LOS: 3 days

## 2014-01-17 NOTE — Progress Notes (Signed)
1 Day Post-Op  Subjective: Complains of pain all over but she lives with pain all the time at home. Upset that she is not getting the pain meds she was taking baseline at home  Objective: Vital signs in last 24 hours: Temp:  [97.4 F (36.3 C)-99.5 F (37.5 C)] 98.9 F (37.2 C) (10/24 0600) Pulse Rate:  [75-94] 91 (10/24 0600) Resp:  [10-19] 16 (10/24 0600) BP: (105-167)/(55-89) 128/55 mmHg (10/24 0600) SpO2:  [95 %-100 %] 100 % (10/24 0600) Last BM Date: 01/17/14  Intake/Output from previous day: 10/23 0701 - 10/24 0700 In: 3335.4 [P.O.:240; I.V.:3095.4] Out: 0  Intake/Output this shift:    Resp: clear to auscultation bilaterally Breasts: wound of right breast with moderate drainage Cardio: regular rate and rhythm GI: soft, non-tender; bowel sounds normal; no masses,  no organomegaly  Lab Results:   Recent Labs  01/16/14 0801 01/17/14 0557  WBC 6.1 11.2*  HGB 10.7* 11.1*  HCT 33.3* 34.1*  PLT 471* 540*   BMET  Recent Labs  01/16/14 0801 01/17/14 0557  NA 140 139  K 4.3 3.7  CL 105 102  CO2 26 24  GLUCOSE 88 187*  BUN 8 6  CREATININE 0.64 0.53  CALCIUM 8.7 9.3   PT/INR  Recent Labs  01/16/14 0801  LABPROT 14.1  INR 1.08   ABG No results found for this basename: PHART, PCO2, PO2, HCO3,  in the last 72 hours  Studies/Results: US Breast Complete Uni Right Inc Axilla  01/15/2014   CLINICAL DATA:  58 year old female with history of right breast collection, drainage and recent drain removal. Right breast cellulitis and increasing pain.  EXAM: ULTRASOUND OF THE RIGHT BREAST  COMPARISON:  01/09/2014.  FINDINGS: Ultrasound is performed, showing a 0.6 x 1 x 3 cm ill-defined collection at the 5 o'clock position of the right breast 3-4 cm from the nipple. This has a similar appearance to the prior study, but now without a drain identified.  No other abnormalities are identified within the right breast.  IMPRESSION: 0.6 x 1 x 3 cm persistent collection within the  inner lower right breast. This may represent a postoperative collection or hematoma with infection not excluded.  Recommend bilateral mammograms when clinically able to resume annual mammogram schedule.   Electronically Signed   By: Laveda Abbe M.D.   On: 01/15/2014 13:35    Anti-infectives: Anti-infectives   Start     Dose/Rate Route Frequency Ordered Stop   01/15/14 1400  vancomycin (VANCOCIN) 750 mg in sodium chloride 0.9 % 150 mL IVPB     750 mg 150 mL/hr over 60 Minutes Intravenous Every 12 hours 01/15/14 0309     01/15/14 0000  vancomycin (VANCOCIN) IVPB 1000 mg/200 mL premix     1,000 mg 200 mL/hr over 60 Minutes Intravenous  Once 01/14/14 2351 01/15/14 0147      Assessment/Plan: s/p Procedure(s): INCISION AND DRAINAGE AND OF RIGHT BREAST ABCESS (Right) Start dressing changes today Continue IV Vanc Will adjust pain meds to get better control  LOS: 3 days    TOTH III,PAUL S 01/17/2014

## 2014-01-17 NOTE — Consult Note (Addendum)
Kara [name] for Infectious Disease  Date of Admission:  01/14/2014  Date of Consult:  01/17/2014  Reason for Consult: Cellulitis, Wound infection R breast Referring Physician: Thompson  Impression/Recommendation Wound Infection R breast Cellulitis RA Fibromyalgia  Would Await her wound Cx from 10-23 Add cefepime.  Check HIV and hepatitis panels per CDC.  I would not give her humira, prednisone or any other immunosuppressants while she has an active infection.   Comment- Breast augmentation site infections usually follow the typical wound infection microbiology (staph, strep) however literature searches can find any number of unusual organisms (mycobacteria, GNRs). Would favor giving her broader coverage.  I spoke with her that her anbx duration would be based on her clinical response. That she will get at least 1 week of IV inpt and her f/u anbx (IV or po) would then be determined.   Thank you so much for this interesting consult,   Kara [name] (pager) [phone] [email or web address]  Kara [name] is an 58 y.o. female.  HPI: 58 yo F with hx of RA (on humira), fibromyalgia, who had breast implants done 2003. She States that she had them removed 11-02-13 as they were too heavy and were causing her pain in her shoulders due to her RA and fibromyalgia. She did well post-operatively and was even able to travel out of the country.  By 9-20 she noted swelling and pain in her R breast . She was given oral anbx (which gave her n/v, diarrhea) and she had f/u in ED (10-11). She had u/s which showed no abscess and she was sent home with analgesics, anbx.  She then noted that her breast wound began to turn purple and was she was seen by her surgeon. She wanted to be admitted to the hospital but instead she had drains placed in the office. She states that these drains expressed clear fluid and she initially improved then her breast became "hard as a rock".  On 10-16 she was admitted  to [facility] and was treated with vanco/zosyn. Her drains were removed and she felt much better. She was d/c home on 10-18 doxy/augmentin. She was not able to tolerate this.  She returned to the hospital on 10-22 with worsened pain and induration of her wound. She was taken to OR on  10-23 and underwent I & D of her R breast.  Her Cx are negative to date.   Past Medical History  Diagnosis Date  . Fibromyalgia   . 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 Arthritis   . Rheumatoid arthritis(714.0)     autoimmune arthritis; methotrexate once/week  . Depression     takes meds daily  . Post-nasal drip     hx of  . Other specified rheumatoid arthritis, right shoulder 08/01/2011  . Asthma   . Allergy   . Chronic kidney disease   . Urinary incontinence   . Family history of malignant neoplasm of breast   . Chronic pain   . Cellulitis of breast 11/2013    RIGHT BREAST    Past Surgical History  Procedure Laterality Date  . Shoulder surgery  2012    right  . Back surgery  2011    lower back fusion l4-5, s1  . Bladder surgery  2009  . Tubal ligation  1988  . Breast enhancement surgery  2003  . Liposuction  2003    abdominal  . Tonsillectomy  1987  . Total shoulder  arthroplasty  08/01/2011    Procedure: TOTAL SHOULDER ARTHROPLASTY;  Surgeon: Johnny Bridge, MD;  Location: [place];  Service: Orthopedics;  Laterality: Right;  Right total shoulder arthroplasty  . Mass excision  11/03/2011    Procedure: MINOR EXCISION OF MASS;  Surgeon: Cammie Sickle., MD;  Location: [place];  Service: Orthopedics;  Laterality: Left;  debride IP joint, cyst excision left index  . Breast surgery  10/2013    REMOVAL OF BREAST IMPLANTS  . Breast implants removed       Allergies  Allergen Reactions  . Aspirin Anaphylaxis  . Ibuprofen Anaphylaxis  . Ativan [Lorazepam] Other (See Comments)    Makes agitated, combative   . Tramadol Hcl Itching and Rash     Medications:  Scheduled: . amphetamine-dextroamphetamine  30 mg Oral Q24H  . amphetamine-dextroamphetamine  60 mg Oral QAC breakfast  . fentaNYL  50 mcg Transdermal Q72H  . meloxicam  15 mg Oral Daily  . morphine      . vancomycin (VANCOCIN) 750 mg IVPB  750 mg Intravenous Q12H    Abtx:  Anti-infectives   Start     Dose/Rate Route Frequency Ordered Stop   01/15/14 1400  vancomycin (VANCOCIN) 750 mg in sodium chloride 0.9 % 150 mL IVPB     750 mg 150 mL/hr over 60 Minutes Intravenous Every 12 hours 01/15/14 0309     01/15/14 0000  vancomycin (VANCOCIN) IVPB 1000 mg/200 mL premix     1,000 mg 200 mL/hr over 60 Minutes Intravenous  Once 01/14/14 2351 01/15/14 0147      Total days of antibiotics: 3 vanco          Social History:  reports that she has quit smoking. Her smoking use included Cigarettes. She has a 5 pack-year smoking history. She has never used smokeless tobacco. She reports that she does not drink alcohol or use illicit drugs.  Family History  Problem Relation Age of Onset  . Arthritis Mother   . Heart disease Mother     ?psvt  . Breast cancer Mother 19    TAH/BSO  . COPD Father   . Hypertension Father   . Breast cancer Maternal Aunt 27    deceased  . Cancer Cousin 73    female; unknown primary  . Colon cancer Paternal Aunt 47    deceased at 24  . Stomach cancer Paternal Uncle 94    deceased at 47    General ROS: no n/v, no f/c, normal BM, normal urination, see HPI.   Blood pressure 147/78, pulse 94, temperature 98.6 F (37 C), temperature source Oral, resp. rate 18, height 5' 4" (1.626 m), weight 66.225 kg (146 lb), SpO2 98.00%. General appearance: alert, cooperative and no distress Eyes: negative findings: conjunctivae and sclerae normal and pupils equal, round, reactive to light and accomodation Throat: lips, mucosa, and tongue normal; teeth and gums normal Neck: no adenopathy and supple, symmetrical, trachea midline Lungs: clear to  auscultation bilaterally Breasts: (examined with nurse assistant) she had 2 wounds on her R breast. the midline superior is packed, has mild-mod induration underneath on medial and lateral sides. it is non-tender. the inferior wound is mildly tender. both wounds are packed. no d/c noted. there is no heat appreciated. there is mild erythema.  Abdomen: normal findings: bowel sounds normal and soft, non-tender Extremities: edema none and R shoulder is no heat, no erythema   Results for orders placed during the hospital encounter of 01/14/14 (from  the past 48 hour(s))  SURGICAL PCR SCREEN     Status: None   Collection Time    01/15/14  9:53 PM      Result Value Ref Range   MRSA, PCR NEGATIVE  NEGATIVE   Staphylococcus aureus NEGATIVE  NEGATIVE   Comment:            The Xpert SA Assay (FDA     approved for NASAL specimens     in patients over 35 years of age),     is one component of     a comprehensive surveillance     program.  Test performance has     been validated by [employer] for patients greater     than or equal to 28 year old.     It is not intended     to diagnose infection nor to     guide or monitor treatment.  CBC WITH DIFFERENTIAL     Status: Abnormal   Collection Time    01/16/14  8:01 AM      Result Value Ref Range   WBC 6.1  4.0 - 10.5 K/uL   RBC 3.96  3.87 - 5.11 MIL/uL   Hemoglobin 10.7 (*) 12.0 - 15.0 g/dL   HCT 33.3 (*) 36.0 - 46.0 %   MCV 84.1  78.0 - 100.0 fL   MCH 27.0  26.0 - 34.0 pg   MCHC 32.1  30.0 - 36.0 g/dL   RDW 13.2  11.5 - 15.5 %   Platelets 471 (*) 150 - 400 K/uL   Neutrophils Relative % 33 (*) 43 - 77 %   Neutro Abs 2.0  1.7 - 7.7 K/uL   Lymphocytes Relative 56 (*) 12 - 46 %   Lymphs Abs 3.4  0.7 - 4.0 K/uL   Monocytes Relative 7  3 - 12 %   Monocytes Absolute 0.4  0.1 - 1.0 K/uL   Eosinophils Relative 4  0 - 5 %   Eosinophils Absolute 0.3  0.0 - 0.7 K/uL   Basophils Relative 0  0 - 1 %   Basophils Absolute 0.0  0.0 - 0.1 K/uL   BASIC METABOLIC PANEL     Status: None   Collection Time    01/16/14  8:01 AM      Result Value Ref Range   Sodium 140  137 - 147 mEq/L   Potassium 4.3  3.7 - 5.3 mEq/L   Chloride 105  96 - 112 mEq/L   CO2 26  19 - 32 mEq/L   Glucose, Bld 88  70 - 99 mg/dL   BUN 8  6 - 23 mg/dL   Creatinine, Ser 0.64  0.50 - 1.10 mg/dL   Calcium 8.7  8.4 - 10.5 mg/dL   GFR calc non Af Amer >90  >90 mL/min   GFR calc Af Amer >90  >90 mL/min   Comment: (NOTE)     The eGFR has been calculated using the CKD EPI equation.     This calculation has not been validated in all clinical situations.     eGFR's persistently <90 mL/min signify possible Chronic Kidney     Disease.   Anion gap 9  5 - 15  PROTIME-INR     Status: None   Collection Time    01/16/14  8:01 AM      Result Value Ref Range   Prothrombin Time 14.1  11.6 - 15.2 seconds   INR 1.08  0.00 - 1.49  ANAEROBIC CULTURE     Status: None   Collection Time    01/16/14 11:03 AM      Result Value Ref Range   Specimen Description ABSCESS RIGHT BREAST     Special Requests NONE     Gram Stain       Value: FEW WBC PRESENT,BOTH PMN AND MONONUCLEAR     NO SQUAMOUS EPITHELIAL CELLS SEEN     NO ORGANISMS SEEN     Performed at [facility]   Culture       Value: NO ANAEROBES ISOLATED; CULTURE IN PROGRESS FOR 5 DAYS     Performed at [facility]   Report Status PENDING    CULTURE, ROUTINE-ABSCESS     Status: None   Collection Time    01/16/14 11:03 AM      Result Value Ref Range   Specimen Description ABSCESS RIGHT BREAST     Special Requests NONE     Gram Stain       Value: FEW WBC PRESENT,BOTH PMN AND MONONUCLEAR     NO SQUAMOUS EPITHELIAL CELLS SEEN     NO ORGANISMS SEEN     Performed at [facility]   Culture       Value: NO GROWTH 1 DAY     Performed at [facility]   Report Status PENDING    BASIC METABOLIC PANEL     Status: Abnormal   Collection Time    01/17/14  5:57 AM      Result Value Ref Range    Sodium 139  137 - 147 mEq/L   Potassium 3.7  3.7 - 5.3 mEq/L   Chloride 102  96 - 112 mEq/L   CO2 24  19 - 32 mEq/L   Glucose, Bld 187 (*) 70 - 99 mg/dL   BUN 6  6 - 23 mg/dL   Creatinine, Ser 0.53  0.50 - 1.10 mg/dL   Calcium 9.3  8.4 - 10.5 mg/dL   GFR calc non Af Amer >90  >90 mL/min   GFR calc Af Amer >90  >90 mL/min   Comment: (NOTE)     The eGFR has been calculated using the CKD EPI equation.     This calculation has not been validated in all clinical situations.     eGFR's persistently <90 mL/min signify possible Chronic Kidney     Disease.   Anion gap 13  5 - 15  CBC     Status: Abnormal   Collection Time    01/17/14  5:57 AM      Result Value Ref Range   WBC 11.2 (*) 4.0 - 10.5 K/uL   RBC 4.13  3.87 - 5.11 MIL/uL   Hemoglobin 11.1 (*) 12.0 - 15.0 g/dL   HCT 34.1 (*) 36.0 - 46.0 %   MCV 82.6  78.0 - 100.0 fL   MCH 26.9  26.0 - 34.0 pg   MCHC 32.6  30.0 - 36.0 g/dL   RDW 13.1  11.5 - 15.5 %   Platelets 540 (*) 150 - 400 K/uL      Component Value Date/Time   SDES ABSCESS RIGHT BREAST 01/16/2014 1103   SDES ABSCESS RIGHT BREAST 01/16/2014 1103   SPECREQUEST NONE 01/16/2014 1103   SPECREQUEST NONE 01/16/2014 1103   CULT  Value: NO ANAEROBES ISOLATED; CULTURE IN PROGRESS FOR 5 DAYS Performed at [facility] 01/16/2014 1103   CULT  Value: NO GROWTH 1 DAY Performed  at [facility] 01/16/2014 1103   REPTSTATUS PENDING 01/16/2014 1103   REPTSTATUS PENDING 01/16/2014 1103   No results found. Recent Results (from the past 240 hour(s))  CULTURE, BLOOD (ROUTINE X 2)     Status: None   Collection Time    01/09/14 11:25 AM      Result Value Ref Range Status   Specimen Description BLOOD RIGHT ANTECUBITAL   Final   Special Requests BOTTLES DRAWN AEROBIC AND ANAEROBIC 10ML   Final   Culture  Setup Time     Final   Value: 01/09/2014 17:19     Performed at [facility]   Culture     Final   Value: NO GROWTH 5 DAYS     Performed at [facility]   Report Status 01/15/2014 FINAL   Final  CULTURE, BLOOD (ROUTINE X 2)     Status: None   Collection Time    01/09/14 11:35 AM      Result Value Ref Range Status   Specimen Description BLOOD RIGHT ANTECUBITAL   Final   Special Requests BOTTLES DRAWN AEROBIC AND ANAEROBIC 10ML   Final   Culture  Setup Time     Final   Value: 01/09/2014 17:18     Performed at [facility]   Culture     Final   Value: NO GROWTH 5 DAYS     Performed at [facility]   Report Status 01/15/2014 FINAL   Final  WOUND CULTURE     Status: None   Collection Time    01/15/14 12:47 PM      Result Value Ref Range Status   Specimen Description BREAST RIGHT   Final   Special Requests Immunocompromised   Final   Gram Stain     Final   Value: NO WBC SEEN     NO SQUAMOUS EPITHELIAL CELLS SEEN     NO ORGANISMS SEEN     Performed at [facility]   Culture     Final   Value: NO GROWTH 2 DAYS     Performed at [facility]   Report Status 01/17/2014 FINAL   Final  SURGICAL PCR SCREEN     Status: None   Collection Time    01/15/14  9:53 PM      Result Value Ref Range Status   MRSA, PCR NEGATIVE  NEGATIVE Final   Staphylococcus aureus NEGATIVE  NEGATIVE Final   Comment:            The Xpert SA Assay (FDA     approved for NASAL specimens     in patients over 94 years of age),     is one component of     a comprehensive surveillance     program.  Test performance has     been validated by [employer] for patients greater     than or equal to 80 year old.     It is not intended     to diagnose infection nor to     guide or monitor treatment.  ANAEROBIC CULTURE     Status: None   Collection Time    01/16/14 11:03 AM      Result Value Ref Range Status   Specimen Description ABSCESS RIGHT BREAST   Final   Special Requests NONE   Final   Gram Stain     Final   Value: FEW WBC PRESENT,BOTH PMN  AND MONONUCLEAR     NO SQUAMOUS EPITHELIAL CELLS SEEN     NO ORGANISMS  SEEN     Performed at [facility]   Culture     Final   Value: NO ANAEROBES ISOLATED; CULTURE IN PROGRESS FOR 5 DAYS     Performed at [facility]   Report Status PENDING   Incomplete  CULTURE, ROUTINE-ABSCESS     Status: None   Collection Time    01/16/14 11:03 AM      Result Value Ref Range Status   Specimen Description ABSCESS RIGHT BREAST   Final   Special Requests NONE   Final   Gram Stain     Final   Value: FEW WBC PRESENT,BOTH PMN AND MONONUCLEAR     NO SQUAMOUS EPITHELIAL CELLS SEEN     NO ORGANISMS SEEN     Performed at [facility]   Culture     Final   Value: NO GROWTH 1 DAY     Performed at [facility]   Report Status PENDING   Incomplete      01/17/2014, 6:22 PM     LOS: 3 days

## 2014-01-18 DIAGNOSIS — M549 Dorsalgia, unspecified: Secondary | ICD-10-CM

## 2014-01-18 LAB — BASIC METABOLIC PANEL
ANION GAP: 10 (ref 5–15)
BUN: 9 mg/dL (ref 6–23)
CO2: 29 meq/L (ref 19–32)
Calcium: 9.4 mg/dL (ref 8.4–10.5)
Chloride: 102 mEq/L (ref 96–112)
Creatinine, Ser: 0.64 mg/dL (ref 0.50–1.10)
GFR calc Af Amer: >90 mL/min (ref >90)
GFR calc non Af Amer: >90 mL/min (ref >90)
Glucose, Bld: 90 mg/dL (ref 70–99)
POTASSIUM: 3.9 meq/L (ref 3.7–5.3)
SODIUM: 141 meq/L (ref 137–147)

## 2014-01-18 LAB — CBC
HCT: 34 % — ABNORMAL LOW (ref 36.0–46.0)
HEMOGLOBIN: 10.7 g/dL — AB (ref 12.0–15.0)
MCH: 26.4 pg (ref 26.0–34.0)
MCHC: 31.5 g/dL (ref 30.0–36.0)
MCV: 83.7 fL (ref 78.0–100.0)
PLATELETS: 555 10*3/uL — AB (ref 150–400)
RBC: 4.06 MIL/uL (ref 3.87–5.11)
RDW: 13.5 % (ref 11.5–15.5)
WBC: 10.1 10*3/uL (ref 4.0–10.5)

## 2014-01-18 LAB — HEPATITIS PANEL, ACUTE
HCV Ab: NEGATIVE
HEP B S AG: NEGATIVE
Hep A IgM: NONREACTIVE
Hep B C IgM: NONREACTIVE

## 2014-01-18 LAB — HIV ANTIBODY (ROUTINE TESTING W REFLEX): HIV: NONREACTIVE

## 2014-01-18 LAB — VANCOMYCIN, TROUGH: Vancomycin Tr: 9.9 ug/mL — ABNORMAL LOW (ref 10.0–20.0)

## 2014-01-18 MED ORDER — FLUCONAZOLE 150 MG PO TABS
150.0000 mg | ORAL_TABLET | Freq: Every day | ORAL | Status: DC
  Filled 2014-01-18: qty 1

## 2014-01-18 MED ORDER — MORPHINE SULFATE 4 MG/ML IJ SOLN
4.0000 mg | INTRAMUSCULAR | Status: DC | PRN
  Administered 2014-01-18 – 2014-01-20 (×14): 4 mg via INTRAVENOUS
  Filled 2014-01-18 (×14): qty 1

## 2014-01-18 MED ORDER — FLUCONAZOLE 150 MG PO TABS
150.0000 mg | ORAL_TABLET | Freq: Once | ORAL | Status: AC
  Administered 2014-01-18: 150 mg via ORAL
  Filled 2014-01-18: qty 1

## 2014-01-18 MED ORDER — CLOTRIMAZOLE 1 % VA CREA
1.0000 | TOPICAL_CREAM | Freq: Every day | VAGINAL | Status: DC
  Administered 2014-01-18 – 2014-01-19 (×2): 1 via VAGINAL
  Filled 2014-01-18: qty 45

## 2014-01-18 NOTE — Progress Notes (Signed)
2 Days Post-Op  Subjective: Complains of pain  Objective: Vital signs in last 24 hours: Temp:  [98.4 F (36.9 C)-98.6 F (37 C)] 98.5 F (36.9 C) (10/25 0617) Pulse Rate:  [74-94] 74 (10/25 0617) Resp:  [16-18] 18 (10/25 0617) BP: (147-153)/(78-88) 153/78 mmHg (10/25 0617) SpO2:  [98 %-100 %] 100 % (10/25 0617) Last BM Date: 01/17/14  Intake/Output from previous day: 10/24 0701 - 10/25 0700 In: 1320 [P.O.:1320] Out: 700 [Urine:700] Intake/Output this shift:    Resp: clear to auscultation bilaterally Breasts: right wound stable Cardio: regular rate and rhythm  Lab Results:   Recent Labs  01/17/14 0557 01/18/14 0616  WBC 11.2* 10.1  HGB 11.1* 10.7*  HCT 34.1* 34.0*  PLT 540* 555*   BMET  Recent Labs  01/17/14 0557 01/18/14 0616  NA 139 141  K 3.7 3.9  CL 102 102  CO2 24 29  GLUCOSE 187* 90  BUN 6 9  CREATININE 0.53 0.64  CALCIUM 9.3 9.4   PT/INR  Recent Labs  01/16/14 0801  LABPROT 14.1  INR 1.08   ABG No results found for this basename: PHART, PCO2, PO2, HCO3,  in the last 72 hours  Studies/Results: No results found.  Anti-infectives: Anti-infectives   Start     Dose/Rate Route Frequency Ordered Stop   01/17/14 2000  ceFEPIme (MAXIPIME) 2 g in dextrose 5 % 50 mL IVPB     2 g 100 mL/hr over 30 Minutes Intravenous Every 12 hours 01/17/14 1841     01/15/14 1400  vancomycin (VANCOCIN) 750 mg in sodium chloride 0.9 % 150 mL IVPB     750 mg 150 mL/hr over 60 Minutes Intravenous Every 12 hours 01/15/14 0309     01/15/14 0000  vancomycin (VANCOCIN) IVPB 1000 mg/200 mL premix     1,000 mg 200 mL/hr over 60 Minutes Intravenous  Once 01/14/14 2351 01/15/14 0147      Assessment/Plan: s/p Procedure(s): INCISION AND DRAINAGE AND OF RIGHT BREAST ABCESS (Right) ID added cefepime yesterday. Continue vanc Await culture results Pain control Dressing changes  LOS: 4 days    TOTH III,Tanylah Schnoebelen S 01/18/2014

## 2014-01-18 NOTE — Progress Notes (Signed)
[facility] PROGRESS NOTE  Kara [name] ZOX:[id number] DOB: Jul 09, 1955 DOA: 01/14/2014 PCP: [name], MD  Assessment/Plan: #1 postoperative right breast abscess/cellulitis Status post incision and drainage of right breast abscess. Wound cultures have been sent off. Patient currently afebrile. Patient complaining of pain in the right breast, especially during dressing changes. Patient feels her pain is better controlled at this time. Continue empiric IV vancomycin and IV cefepime which was started yesterday. Infectious diseases.. Pain management. Pain regimen has been readjusted per general surgery. General surgery following and appreciate input and recommendations.  Patient has been seen by infectious diseases,  And IV cefepime was added to patient's regimen. HIV and hepatitis panel pending.  Per ID patient should get at least 1 week of IV antibiotics inpatient follow-up on antibiotic bead IV or by mouth 1 then be determined.Will consult with infectious diseases for antibiotic duration and recommendation.  #2 rheumatoid arthritis Immunosuppressive medications on hold secondary to problem #1. Outpatient followup.  #3 depression/anxiety Stable. Continue Xanax as needed.  #4 chronic kidney disease Stable.  #5 chronic pain  Continue current pain regimen.  #6 prophylaxis SCDs for DVT prophylaxis.   Code Status: Full Family Communication: Updated patient at bedside. Disposition Plan: Home when medically stable.   Consultants:  General surgery: Dr. Johna Sheriff 01/15/2014   ID: Dr. Ninetta Lights 01/17/2014  Procedures:  Ultrasound of the right breast 01/15/2014  Incision and drainage of right breast abscess/cellulitis  Antibiotics:  IV vancomycin 01/15/2014   IV cefepime 01/17/2014  HPI/Subjective: Patient states  Pain better managed. Patient complaining of vaginal itching.  Objective: Filed Vitals:   01/18/14 0617  BP: 153/78  Pulse: 74  Temp: 98.5 F (36.9 C)   Resp: 18    Intake/Output Summary (Last 24 hours) at 01/18/14 1107 Last data filed at 01/18/14 0947  Gross per 24 hour  Intake   1320 ml  Output      0 ml  Net   1320 ml   Filed Weights   01/14/14 2118  Weight: 66.225 kg (146 lb)    Exam:   General:  nad  Cardiovascular: RRR  Breast: Right breast bandaged.  Respiratory: CTAB  Abdomen: Soft, nontender, nondistended, positive bowel sounds.  Musculoskeletal: No clubbing cyanosis or edema   Data Reviewed: Basic Metabolic Panel:  Recent Labs Lab 01/14/14 2350 01/15/14 0525 01/16/14 0801 01/17/14 0557 01/18/14 0616  NA 139 140 140 139 141  K 3.8 3.6* 4.3 3.7 3.9  CL 100 103 105 102 102  CO2 24 27 26 24 29   GLUCOSE 105* 115* 88 187* 90  BUN 18 18 8 6 9   CREATININE 0.64 0.62 0.64 0.53 0.64  CALCIUM 10.0 9.1 8.7 9.3 9.4  MG  --  2.1  --   --   --   PHOS  --  4.0  --   --   --    Liver Function Tests:  Recent Labs Lab 01/15/14 0525  AST 14  ALT 13  ALKPHOS 120*  BILITOT <0.2*  PROT 6.6  ALBUMIN 3.3*   No results found for this basename: LIPASE, AMYLASE,  in the last 168 hours No results found for this basename: AMMONIA,  in the last 168 hours CBC:  Recent Labs Lab 01/14/14 2350 01/15/14 0525 01/16/14 0801 01/17/14 0557 01/18/14 0616  WBC 9.4 7.9 6.1 11.2* 10.1  NEUTROABS 3.8  --  2.0  --   --   HGB 12.5 11.0* 10.7* 11.1* 10.7*  HCT 38.4 34.8* 33.3* 34.1* 34.0*  MCV  83.1 83.9 84.1 82.6 83.7  PLT 594* 498* 471* 540* 555*   Cardiac Enzymes: No results found for this basename: CKTOTAL, CKMB, CKMBINDEX, TROPONINI,  in the last 168 hours BNP (last 3 results) No results found for this basename: PROBNP,  in the last 8760 hours CBG: No results found for this basename: GLUCAP,  in the last 168 hours  Recent Results (from the past 240 hour(s))  CULTURE, BLOOD (ROUTINE X 2)     Status: None   Collection Time    01/09/14 11:25 AM      Result Value Ref Range Status   Specimen Description BLOOD  RIGHT ANTECUBITAL   Final   Special Requests BOTTLES DRAWN AEROBIC AND ANAEROBIC   Final   Culture  Setup Time     Final   Value: 01/09/2014 17:19     Performed at [facility]   Culture     Final   Value: NO GROWTH 5 DAYS     Performed at [facility]   Report Status 01/15/2014 FINAL   Final  CULTURE, BLOOD (ROUTINE X 2)     Status: None   Collection Time    01/09/14 11:35 AM      Result Value Ref Range Status   Specimen Description BLOOD RIGHT ANTECUBITAL   Final   Special Requests BOTTLES DRAWN AEROBIC AND ANAEROBIC   Final   Culture  Setup Time     Final   Value: 01/09/2014 17:18     Performed at [facility]   Culture     Final   Value: NO GROWTH 5 DAYS     Performed at [facility]   Report Status 01/15/2014 FINAL   Final  WOUND CULTURE     Status: None   Collection Time    01/15/14 12:47 PM      Result Value Ref Range Status   Specimen Description BREAST RIGHT   Final   Special Requests Immunocompromised   Final   Gram Stain     Final   Value: NO WBC SEEN     NO SQUAMOUS EPITHELIAL CELLS SEEN     NO ORGANISMS SEEN     Performed at [facility]   Culture     Final   Value: NO GROWTH 2 DAYS     Performed at [facility]   Report Status 01/17/2014 FINAL   Final  SURGICAL PCR SCREEN     Status: None   Collection Time    01/15/14  9:53 PM      Result Value Ref Range Status   MRSA, PCR NEGATIVE  NEGATIVE Final   Staphylococcus aureus NEGATIVE  NEGATIVE Final   Comment:            The Xpert SA Assay (FDA     approved for NASAL specimens     in patients over 57 years of age),     is one component of     a comprehensive surveillance     program.  Test performance has     been validated by [employer] for patients greater     than or equal to 66 year old.     It is not intended     to diagnose infection nor to     guide or monitor treatment.  ANAEROBIC CULTURE     Status: None   Collection Time     01/16/14 11:03 AM  Result Value Ref Range Status   Specimen Description ABSCESS RIGHT BREAST   Final   Special Requests NONE   Final   Gram Stain     Final   Value: FEW WBC PRESENT,BOTH PMN AND MONONUCLEAR     NO SQUAMOUS EPITHELIAL CELLS SEEN     NO ORGANISMS SEEN     Performed at [facility]   Culture     Final   Value: NO ANAEROBES ISOLATED; CULTURE IN PROGRESS FOR 5 DAYS     Performed at [facility]   Report Status PENDING   Incomplete  CULTURE, ROUTINE-ABSCESS     Status: None   Collection Time    01/16/14 11:03 AM      Result Value Ref Range Status   Specimen Description ABSCESS RIGHT BREAST   Final   Special Requests NONE   Final   Gram Stain     Final   Value: FEW WBC PRESENT,BOTH PMN AND MONONUCLEAR     NO SQUAMOUS EPITHELIAL CELLS SEEN     NO ORGANISMS SEEN     Performed at [facility]   Culture     Final   Value: NO GROWTH 2 DAYS     Performed at [facility]   Report Status PENDING   Incomplete     Studies: No results found.  Scheduled Meds: . amphetamine-dextroamphetamine  30 mg Oral Q24H  . amphetamine-dextroamphetamine  60 mg Oral QAC breakfast  . ceFEPime (MAXIPIME) IV  2 g Intravenous Q12H  . clotrimazole  1 Applicatorful Vaginal QHS  . fentaNYL  50 mcg Transdermal Q72H  . fluconazole  150 mg Oral Daily  . meloxicam  15 mg Oral Daily  . vancomycin (VANCOCIN) 750 mg IVPB  750 mg Intravenous Q12H   Continuous Infusions:    Principal Problem:   Cellulitis of female breast Active Problems:   Abscess of right breast   LOW BACK PAIN   History of asthma   Rheumatoid arthritis   Fibromyalgia   Depression    Time spent: 35 minutes    THOMPSON,DANIEL M.D. [facility] Pager [phone]. If 7PM-7AM, please contact night-coverage at [email or web address], password [facility] 01/18/2014, 11:07 AM  LOS: 4 days

## 2014-01-18 NOTE — Progress Notes (Signed)
INFECTIOUS DISEASE PROGRESS NOTE  ID: Kara [name] is a 58 y.o. female with  Principal Problem:   Cellulitis of female breast Active Problems:   LOW BACK PAIN   History of asthma   Rheumatoid arthritis   Fibromyalgia   Depression   Abscess of right breast  Subjective: Less pain, less induration. Looking forward to taking a shower.   Abtx:  Anti-infectives   Start     Dose/Rate Route Frequency Ordered Stop   01/18/14 1200  fluconazole (DIFLUCAN) tablet 150 mg  Status:  Discontinued     150 mg Oral Daily 01/18/14 1105 01/18/14 1122   01/18/14 1200  fluconazole (DIFLUCAN) tablet 150 mg     150 mg Oral  Once 01/18/14 1122     01/17/14 2000  ceFEPIme (MAXIPIME) 2 g in dextrose 5 % 50 mL IVPB     2 g 100 mL/hr over 30 Minutes Intravenous Every 12 hours 01/17/14 1841     01/15/14 1400  vancomycin (VANCOCIN) 750 mg in sodium chloride 0.9 % 150 mL IVPB     750 mg 150 mL/hr over 60 Minutes Intravenous Every 12 hours 01/15/14 0309     01/15/14 0000  vancomycin (VANCOCIN) IVPB 1000 mg/200 mL premix     1,000 mg 200 mL/hr over 60 Minutes Intravenous  Once 01/14/14 2351 01/15/14 0147      Medications:  Scheduled: . amphetamine-dextroamphetamine  30 mg Oral Q24H  . amphetamine-dextroamphetamine  60 mg Oral QAC breakfast  . ceFEPime (MAXIPIME) IV  2 g Intravenous Q12H  . clotrimazole  1 Applicatorful Vaginal QHS  . fentaNYL  50 mcg Transdermal Q72H  . fluconazole  150 mg Oral Once  . meloxicam  15 mg Oral Daily  . vancomycin (VANCOCIN) 750 mg IVPB  750 mg Intravenous Q12H    Objective: Vital signs in last 24 hours: Temp:  [98.4 F (36.9 C)-98.5 F (36.9 C)] 98.5 F (36.9 C) (10/25 0617) Pulse Rate:  [74-76] 74 (10/25 0617) Resp:  [16-18] 18 (10/25 0617) BP: (149-153)/(78-88) 153/78 mmHg (10/25 0617) SpO2:  [99 %-100 %] 100 % (10/25 0617)   General appearance: alert, cooperative and no distress Resp: clear to auscultation bilaterally Cardio: regular rate and rhythm GI:  normal findings: bowel sounds normal and soft, non-tender  Lab Results  Recent Labs  01/17/14 0557 01/18/14 0616  WBC 11.2* 10.1  HGB 11.1* 10.7*  HCT 34.1* 34.0*  NA 139 141  K 3.7 3.9  CL 102 102  CO2 24 29  BUN 6 9  CREATININE 0.53 0.64   Liver Panel No results found for this basename: PROT, ALBUMIN, AST, ALT, ALKPHOS, BILITOT, BILIDIR, IBILI,  in the last 72 hours Sedimentation Rate No results found for this basename: ESRSEDRATE,  in the last 72 hours C-Reactive Protein No results found for this basename: CRP,  in the last 72 hours  Microbiology: Recent Results (from the past 240 hour(s))  CULTURE, BLOOD (ROUTINE X 2)     Status: None   Collection Time    01/09/14 11:25 AM      Result Value Ref Range Status   Specimen Description BLOOD RIGHT ANTECUBITAL   Final   Special Requests BOTTLES DRAWN AEROBIC AND ANAEROBIC   Final   Culture  Setup Time     Final   Value: 01/09/2014 17:19     Performed at [facility]   Culture     Final   Value: NO GROWTH 5 DAYS  Performed at [facility]   Report Status 01/15/2014 FINAL   Final  CULTURE, BLOOD (ROUTINE X 2)     Status: None   Collection Time    01/09/14 11:35 AM      Result Value Ref Range Status   Specimen Description BLOOD RIGHT ANTECUBITAL   Final   Special Requests BOTTLES DRAWN AEROBIC AND ANAEROBIC   Final   Culture  Setup Time     Final   Value: 01/09/2014 17:18     Performed at [facility]   Culture     Final   Value: NO GROWTH 5 DAYS     Performed at [facility]   Report Status 01/15/2014 FINAL   Final  WOUND CULTURE     Status: None   Collection Time    01/15/14 12:47 PM      Result Value Ref Range Status   Specimen Description BREAST RIGHT   Final   Special Requests Immunocompromised   Final   Gram Stain     Final   Value: NO WBC SEEN     NO SQUAMOUS EPITHELIAL CELLS SEEN     NO ORGANISMS SEEN     Performed at [facility]   Culture      Final   Value: NO GROWTH 2 DAYS     Performed at [facility]   Report Status 01/17/2014 FINAL   Final  SURGICAL PCR SCREEN     Status: None   Collection Time    01/15/14  9:53 PM      Result Value Ref Range Status   MRSA, PCR NEGATIVE  NEGATIVE Final   Staphylococcus aureus NEGATIVE  NEGATIVE Final   Comment:            The Xpert SA Assay (FDA     approved for NASAL specimens     in patients over 83 years of age),     is one component of     a comprehensive surveillance     program.  Test performance has     been validated by [employer] for patients greater     than or equal to 48 year old.     It is not intended     to diagnose infection nor to     guide or monitor treatment.  ANAEROBIC CULTURE     Status: None   Collection Time    01/16/14 11:03 AM      Result Value Ref Range Status   Specimen Description ABSCESS RIGHT BREAST   Final   Special Requests NONE   Final   Gram Stain     Final   Value: FEW WBC PRESENT,BOTH PMN AND MONONUCLEAR     NO SQUAMOUS EPITHELIAL CELLS SEEN     NO ORGANISMS SEEN     Performed at [facility]   Culture     Final   Value: NO ANAEROBES ISOLATED; CULTURE IN PROGRESS FOR 5 DAYS     Performed at [facility]   Report Status PENDING   Incomplete  CULTURE, ROUTINE-ABSCESS     Status: None   Collection Time    01/16/14 11:03 AM      Result Value Ref Range Status   Specimen Description ABSCESS RIGHT BREAST   Final   Special Requests NONE   Final   Gram Stain     Final   Value: FEW WBC PRESENT,BOTH PMN AND MONONUCLEAR  NO SQUAMOUS EPITHELIAL CELLS SEEN     NO ORGANISMS SEEN     Performed at [facility]   Culture     Final   Value: NO GROWTH 2 DAYS     Performed at [facility]   Report Status PENDING   Incomplete    Studies/Results: No results found.   Assessment/Plan: Wound Infection R breast  Cellulitis  RA  Fibromyalgia  Total days of antibiotics: 4 vanco, 1 cefepime  Her  1st cx is negative, await final from her 2nd.  No change in anbx for now.  Will continue to watch her wound.           Johny Sax Infectious Diseases (pager) [phone] [email or web address] 01/18/2014, 1:49 PM  LOS: 4 days

## 2014-01-18 NOTE — Progress Notes (Signed)
ANTIBIOTIC CONSULT NOTE  Pharmacy Consult for Vancomycin Indication: Right breast cellulitis  Allergies  Allergen Reactions  . Aspirin Anaphylaxis  . Ibuprofen Anaphylaxis  . Ativan [Lorazepam] Other (See Comments)    Makes agitated, combative   . Tramadol Hcl Itching and Rash    Patient Measurements: Height: 5\' 4"  (162.6 cm) Weight: 146 lb (66.225 kg) IBW/kg (Calculated) : 54.7   Vital Signs: Temp: 98.3 F (36.8 C) (10/25 1421) Temp Source: Oral (10/25 1421) BP: 153/91 mmHg (10/25 1421) Pulse Rate: 89 (10/25 1421) Intake/Output from previous day: 10/24 0701 - 10/25 0700 In: 1320 [P.O.:1320] Out: 700 [Urine:700] Intake/Output from this shift: Total I/O In: 480 [P.O.:480] Out: -   Labs:  Recent Labs  01/16/14 0801 01/17/14 0557 01/18/14 0616  WBC 6.1 11.2* 10.1  HGB 10.7* 11.1* 10.7*  PLT 471* 540* 555*  CREATININE 0.64 0.53 0.64   Estimated Creatinine Clearance: 72.6 ml/min (by C-G formula based on Cr of 0.64).  Recent Labs  01/18/14 1300  VANCOTROUGH 9.9*     Microbiology: Recent Results (from the past 720 hour(s))  CULTURE, BLOOD (ROUTINE X 2)     Status: None   Collection Time    01/09/14 11:25 AM      Result Value Ref Range Status   Specimen Description BLOOD RIGHT ANTECUBITAL   Final   Special Requests BOTTLES DRAWN AEROBIC AND ANAEROBIC 01/11/14   Final   Culture  Setup Time     Final   Value: 01/09/2014 17:19     Performed at 01/11/2014   Culture     Final   Value: NO GROWTH 5 DAYS     Performed at [facility]   Report Status 01/15/2014 FINAL   Final  CULTURE, BLOOD (ROUTINE X 2)     Status: None   Collection Time    01/09/14 11:35 AM      Result Value Ref Range Status   Specimen Description BLOOD RIGHT ANTECUBITAL   Final   Special Requests BOTTLES DRAWN AEROBIC AND ANAEROBIC 01/11/14   Final   Culture  Setup Time     Final   Value: 01/09/2014 17:18     Performed at 01/11/2014   Culture     Final   Value: NO  GROWTH 5 DAYS     Performed at [facility]   Report Status 01/15/2014 FINAL   Final  WOUND CULTURE     Status: None   Collection Time    01/15/14 12:47 PM      Result Value Ref Range Status   Specimen Description BREAST RIGHT   Final   Special Requests Immunocompromised   Final   Gram Stain     Final   Value: NO WBC SEEN     NO SQUAMOUS EPITHELIAL CELLS SEEN     NO ORGANISMS SEEN     Performed at 01/17/14   Culture     Final   Value: NO GROWTH 2 DAYS     Performed at [facility]   Report Status 01/17/2014 FINAL   Final  SURGICAL PCR SCREEN     Status: None   Collection Time    01/15/14  9:53 PM      Result Value Ref Range Status   MRSA, PCR NEGATIVE  NEGATIVE Final   Staphylococcus aureus NEGATIVE  NEGATIVE Final   Comment:            The Xpert SA Assay (FDA  approved for NASAL specimens     in patients over 12 years of age),     is one component of     a comprehensive surveillance     program.  Test performance has     been validated by [employer] for patients greater     than or equal to 15 year old.     It is not intended     to diagnose infection nor to     guide or monitor treatment.  ANAEROBIC CULTURE     Status: None   Collection Time    01/16/14 11:03 AM      Result Value Ref Range Status   Specimen Description ABSCESS RIGHT BREAST   Final   Special Requests NONE   Final   Gram Stain     Final   Value: FEW WBC PRESENT,BOTH PMN AND MONONUCLEAR     NO SQUAMOUS EPITHELIAL CELLS SEEN     NO ORGANISMS SEEN     Performed at [facility]   Culture     Final   Value: NO ANAEROBES ISOLATED; CULTURE IN PROGRESS FOR 5 DAYS     Performed at [facility]   Report Status PENDING   Incomplete  CULTURE, ROUTINE-ABSCESS     Status: None   Collection Time    01/16/14 11:03 AM      Result Value Ref Range Status   Specimen Description ABSCESS RIGHT BREAST   Final   Special Requests NONE   Final   Gram Stain     Final    Value: FEW WBC PRESENT,BOTH PMN AND MONONUCLEAR     NO SQUAMOUS EPITHELIAL CELLS SEEN     NO ORGANISMS SEEN     Performed at [facility]   Culture     Final   Value: NO GROWTH 2 DAYS     Performed at [facility]   Report Status PENDING   Incomplete    Medical History: Past Medical History  Diagnosis Date  . Fibromyalgia   . 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 Arthritis   . Rheumatoid arthritis(714.0)     autoimmune arthritis; methotrexate once/week  . Depression     takes meds daily  . Post-nasal drip     hx of  . Other specified rheumatoid arthritis, right shoulder 08/01/2011  . Asthma   . Allergy   . Chronic kidney disease   . Urinary incontinence   . Family history of malignant neoplasm of breast   . Chronic pain   . Cellulitis of breast 11/2013    RIGHT BREAST    Medications:  Scheduled:  . amphetamine-dextroamphetamine  30 mg Oral Q24H  . amphetamine-dextroamphetamine  60 mg Oral QAC breakfast  . ceFEPime (MAXIPIME) IV  2 g Intravenous Q12H  . clotrimazole  1 Applicatorful Vaginal QHS  . fentaNYL  50 mcg Transdermal Q72H  . fluconazole  150 mg Oral Once  . meloxicam  15 mg Oral Daily  . vancomycin (VANCOCIN) 750 mg IVPB  750 mg Intravenous Q12H   Infusions:    Assessment: 49 yoF s/p breast implant removal 8/15.  Developed cellulitis, admitted to [facility] for IV antibiotics, then discharged 10/18 on Augmentin and switched to doxycycline, both of which she was unable to tolerate due to nausea.  Now with worsening swelling.  Vancomycin ordered with pharmacy dosing.  I & D of R breast  abscess performed 10/23.  Goal of Therapy:  Vancomycin trough level 10-15 mcg/ml - should be adequate following I & D.  Today, 10/25: D#4 vancomycin 750mg  IV q12h D#2 cefepime 2 grams IV q12h  Reportedly has less pain and induration at wound site now Afebrile Leukocytosis resolved Serum creatinine stable Vancomycin trough  acceptable  Plan:  1. Continue present vancomycin dosage (750 mg IV q12h) 2. Cefepime 2 grams IV q12h as per ID service. 3. Follow serum creatinine, cultures, clinical course.  , PharmD, BCPS Pager: [phone] 01/18/2014  2:54 PM

## 2014-01-19 ENCOUNTER — Encounter (HOSPITAL_COMMUNITY): Payer: Self-pay | Admitting: General Surgery

## 2014-01-19 DIAGNOSIS — T814XXD Infection following a procedure, subsequent encounter: Secondary | ICD-10-CM

## 2014-01-19 LAB — BASIC METABOLIC PANEL
Anion gap: 12 (ref 5–15)
BUN: 11 mg/dL (ref 6–23)
CALCIUM: 9.1 mg/dL (ref 8.4–10.5)
CO2: 29 mEq/L (ref 19–32)
CREATININE: 0.65 mg/dL (ref 0.50–1.10)
Chloride: 100 mEq/L (ref 96–112)
GFR calc Af Amer: >90 mL/min (ref >90)
GFR calc non Af Amer: >90 mL/min (ref >90)
GLUCOSE: 108 mg/dL — AB (ref 70–99)
Potassium: 3.6 mEq/L — ABNORMAL LOW (ref 3.7–5.3)
Sodium: 141 mEq/L (ref 137–147)

## 2014-01-19 LAB — CBC
HCT: 35.4 % — ABNORMAL LOW (ref 36.0–46.0)
HEMOGLOBIN: 11.4 g/dL — AB (ref 12.0–15.0)
MCH: 26.5 pg (ref 26.0–34.0)
MCHC: 32.2 g/dL (ref 30.0–36.0)
MCV: 82.3 fL (ref 78.0–100.0)
Platelets: 512 10*3/uL — ABNORMAL HIGH (ref 150–400)
RBC: 4.3 MIL/uL (ref 3.87–5.11)
RDW: 13.1 % (ref 11.5–15.5)
WBC: 9.8 10*3/uL (ref 4.0–10.5)

## 2014-01-19 LAB — CULTURE, ROUTINE-ABSCESS: CULTURE: NO GROWTH

## 2014-01-19 MED ORDER — DIPHENHYDRAMINE HCL 50 MG/ML IJ SOLN
25.0000 mg | Freq: Once | INTRAMUSCULAR | Status: DC
  Filled 2014-01-19: qty 1

## 2014-01-19 MED ORDER — SULFAMETHOXAZOLE-TMP DS 800-160 MG PO TABS
1.0000 | ORAL_TABLET | Freq: Two times a day (BID) | ORAL | Status: DC
  Administered 2014-01-19: 1 via ORAL
  Filled 2014-01-19 (×3): qty 1

## 2014-01-19 MED ORDER — POTASSIUM CHLORIDE CRYS ER 20 MEQ PO TBCR
40.0000 meq | EXTENDED_RELEASE_TABLET | Freq: Once | ORAL | Status: AC
  Administered 2014-01-19: 40 meq via ORAL
  Filled 2014-01-19: qty 2

## 2014-01-19 NOTE — Progress Notes (Signed)
[facility] PROGRESS NOTE  Larry Knipp GYI:[id number] DOB: [birth year] DOA: 01/14/2014 PCP: [name], MD  Assessment/Plan: #1 postoperative right breast abscess/cellulitis Status post incision and drainage of right breast abscess. Wound cultures have been sent off. Patient currently afebrile. Patient states improvement in pain in the right breast, especially during dressing changes. Patient feels her pain is better controlled at this time. Continue empiric IV vancomycin and IV cefepime.  Pain management. Pain regimen has been readjusted per general surgery. General surgery following and appreciate input and recommendations.  Patient has been seen by infectious diseases,  And IV cefepime was added to patient's regimen. HIV and hepatitis panel negative. Per ID patient should get at least 1 week of IV antibiotics inpatient follow-up on antibiotic recommendations, IV or by mouth to be determined. Infectious diseases and general surgery following and I appreciate the input and recommendations.   #2 rheumatoid arthritis Immunosuppressive medications on hold secondary to problem #1. Outpatient followup.  #3 depression/anxiety Stable. Continue Xanax as needed.  #4 chronic kidney disease Stable.  #5 chronic pain  Continue current pain regimen.  #6 prophylaxis SCDs for DVT prophylaxis.   Code Status: Full Family Communication: Updated patient at bedside. Disposition Plan: Home when medically stable.   Consultants:  General surgery: Dr. Johna Sheriff 01/15/2014   ID: Dr. Ninetta Lights 01/17/2014  Procedures:  Ultrasound of the right breast 01/15/2014  Incision and drainage of right breast abscess/cellulitis 01/16/2014 Dr. Johna Sheriff  Antibiotics:  IV vancomycin 01/15/2014   IV cefepime 01/17/2014  HPI/Subjective: Patient states pain better managed.   Objective: Filed Vitals:   01/19/14 0818  BP: 106/68  Pulse:   Temp:   Resp:     Intake/Output Summary (Last 24 hours)  at 01/19/14 1350 Last data filed at 01/19/14 0952  Gross per 24 hour  Intake    950 ml  Output      0 ml  Net    950 ml   Filed Weights   01/14/14 2118  Weight: 66.225 kg (146 lb)    Exam:   General:  nad  Cardiovascular: RRR  Breast: Right breast with significant improvement in erythema. Soft. Less indurated.  Respiratory: CTAB  Abdomen: Soft, nontender, nondistended, positive bowel sounds.  Musculoskeletal: No clubbing cyanosis or edema   Data Reviewed: Basic Metabolic Panel:  Recent Labs Lab 01/15/14 0525 01/16/14 0801 01/17/14 0557 01/18/14 0616 01/19/14 0505  NA 140 140 139 141 141  K 3.6* 4.3 3.7 3.9 3.6*  CL 103 105 102 102 100  CO2 27 26 24 29 29   GLUCOSE 115* 88 187* 90 108*  BUN 18 8 6 9 11   CREATININE 0.62 0.64 0.53 0.64 0.65  CALCIUM 9.1 8.7 9.3 9.4 9.1  MG 2.1  --   --   --   --   PHOS 4.0  --   --   --   --    Liver Function Tests:  Recent Labs Lab 01/15/14 0525  AST 14  ALT 13  ALKPHOS 120*  BILITOT <0.2*  PROT 6.6  ALBUMIN 3.3*   No results found for this basename: LIPASE, AMYLASE,  in the last 168 hours No results found for this basename: AMMONIA,  in the last 168 hours CBC:  Recent Labs Lab 01/14/14 2350 01/15/14 0525 01/16/14 0801 01/17/14 0557 01/18/14 0616 01/19/14 0505  WBC 9.4 7.9 6.1 11.2* 10.1 9.8  NEUTROABS 3.8  --  2.0  --   --   --   HGB 12.5 11.0* 10.7* 11.1* 10.7*  11.4*  HCT 38.4 34.8* 33.3* 34.1* 34.0* 35.4*  MCV 83.1 83.9 84.1 82.6 83.7 82.3  PLT 594* 498* 471* 540* 555* 512*   Cardiac Enzymes: No results found for this basename: CKTOTAL, CKMB, CKMBINDEX, TROPONINI,  in the last 168 hours BNP (last 3 results) No results found for this basename: PROBNP,  in the last 8760 hours CBG: No results found for this basename: GLUCAP,  in the last 168 hours  Recent Results (from the past 240 hour(s))  WOUND CULTURE     Status: None   Collection Time    01/15/14 12:47 PM      Result Value Ref Range Status    Specimen Description BREAST RIGHT   Final   Special Requests Immunocompromised   Final   Gram Stain     Final   Value: NO WBC SEEN     NO SQUAMOUS EPITHELIAL CELLS SEEN     NO ORGANISMS SEEN     Performed at [facility]   Culture     Final   Value: NO GROWTH 2 DAYS     Performed at [facility]   Report Status 01/17/2014 FINAL   Final  SURGICAL PCR SCREEN     Status: None   Collection Time    01/15/14  9:53 PM      Result Value Ref Range Status   MRSA, PCR NEGATIVE  NEGATIVE Final   Staphylococcus aureus NEGATIVE  NEGATIVE Final   Comment:            The Xpert SA Assay (FDA     approved for NASAL specimens     in patients over 50 years of age),     is one component of     a comprehensive surveillance     program.  Test performance has     been validated by [employer] for patients greater     than or equal to 11 year old.     It is not intended     to diagnose infection nor to     guide or monitor treatment.  ANAEROBIC CULTURE     Status: None   Collection Time    01/16/14 11:03 AM      Result Value Ref Range Status   Specimen Description ABSCESS RIGHT BREAST   Final   Special Requests NONE   Final   Gram Stain     Final   Value: FEW WBC PRESENT,BOTH PMN AND MONONUCLEAR     NO SQUAMOUS EPITHELIAL CELLS SEEN     NO ORGANISMS SEEN     Performed at [facility]   Culture     Final   Value: NO ANAEROBES ISOLATED; CULTURE IN PROGRESS FOR 5 DAYS     Performed at [facility]   Report Status PENDING   Incomplete  CULTURE, ROUTINE-ABSCESS     Status: None   Collection Time    01/16/14 11:03 AM      Result Value Ref Range Status   Specimen Description ABSCESS RIGHT BREAST   Final   Special Requests NONE   Final   Gram Stain     Final   Value: FEW WBC PRESENT,BOTH PMN AND MONONUCLEAR     NO SQUAMOUS EPITHELIAL CELLS SEEN     NO ORGANISMS SEEN     Performed at [facility]   Culture     Final   Value: NO GROWTH 3 DAYS  Performed at [facility]   Report Status 01/19/2014 FINAL   Final     Studies: No results found.  Scheduled Meds: . amphetamine-dextroamphetamine  30 mg Oral Q24H  . amphetamine-dextroamphetamine  60 mg Oral QAC breakfast  . ceFEPime (MAXIPIME) IV  2 g Intravenous Q12H  . clotrimazole  1 Applicatorful Vaginal QHS  . diphenhydrAMINE  25 mg Intravenous Once  . fentaNYL  50 mcg Transdermal Q72H  . meloxicam  15 mg Oral Daily  . vancomycin (VANCOCIN) 750 mg IVPB  750 mg Intravenous Q12H   Continuous Infusions:    Principal Problem:   Cellulitis of female breast Active Problems:   Abscess of right breast   LOW BACK PAIN   History of asthma   Rheumatoid arthritis   Fibromyalgia   Depression    Time spent: 35 minutes    THOMPSON,DANIEL M.D. [facility] Pager [phone]. If 7PM-7AM, please contact night-coverage at [email or web address], password [facility] 01/19/2014, 1:50 PM  LOS: 5 days

## 2014-01-19 NOTE — Progress Notes (Signed)
[facility] Progress Note  3 Days Post-Op  Subjective: Pt doing well.  Dressing changes going well with pain meds.  Ambulating well.  Tolerating diet.  BM on 25th.  Objective: Vital signs in last 24 hours: Temp:  [97.8 F (36.6 C)-98.3 F (36.8 C)] 98 F (36.7 C) (10/26 0624) Pulse Rate:  [80-89] 80 (10/26 0624) Resp:  [16-18] 16 (10/26 0624) BP: (79-153)/(55-91) 79/55 mmHg (10/26 0624) SpO2:  [95 %-100 %] 98 % (10/26 0624) Last BM Date: 01/18/14  Intake/Output from previous day: 10/25 0701 - 10/26 0700 In: 1240 [P.O.:840; IV Piggyback:400] Out: -  Intake/Output this shift:    PE: Gen:  Alert, NAD, pleasant Right Breast:  Cellulitis improved, Wound packed (will look at it later - during dressing change), minimal tenderness  Lab Results:   Recent Labs  01/18/14 0616 01/19/14 0505  WBC 10.1 9.8  HGB 10.7* 11.4*  HCT 34.0* 35.4*  PLT 555* 512*   BMET  Recent Labs  01/18/14 0616 01/19/14 0505  NA 141 141  K 3.9 3.6*  CL 102 100  CO2 29 29  GLUCOSE 90 108*  BUN 9 11  CREATININE 0.64 0.65  CALCIUM 9.4 9.1   PT/INR  Recent Labs  01/16/14 0801  LABPROT 14.1  INR 1.08   CMP     Component Value Date/Time   NA 141 01/19/2014 0505   K 3.6* 01/19/2014 0505   CL 100 01/19/2014 0505   CO2 29 01/19/2014 0505   GLUCOSE 108* 01/19/2014 0505   BUN 11 01/19/2014 0505   CREATININE 0.65 01/19/2014 0505   CALCIUM 9.1 01/19/2014 0505   PROT 6.6 01/15/2014 0525   ALBUMIN 3.3* 01/15/2014 0525   AST 14 01/15/2014 0525   ALT 13 01/15/2014 0525   ALKPHOS 120* 01/15/2014 0525   BILITOT <0.2* 01/15/2014 0525   GFRNONAA >90 01/19/2014 0505   GFRAA >90 01/19/2014 0505   Lipase     Component Value Date/Time   LIPASE 28 10/21/2012 0751       Studies/Results: No results found.  Anti-infectives: Anti-infectives   Start     Dose/Rate Route Frequency Ordered Stop   01/18/14 1200  fluconazole (DIFLUCAN) tablet 150 mg  Status:  Discontinued     150 mg Oral Daily 01/18/14 1105 01/18/14 1122   01/18/14 1200  fluconazole (DIFLUCAN) tablet 150 mg     150 mg Oral  Once 01/18/14 1122 01/18/14 1623   01/17/14 2000  ceFEPIme (MAXIPIME) 2 g in dextrose 5 % 50 mL IVPB     2 g 100 mL/hr over 30 Minutes Intravenous Every 12 hours 01/17/14 1841     01/15/14 1400  vancomycin (VANCOCIN) 750 mg in sodium chloride 0.9 % 150 mL IVPB     750 mg 150 mL/hr over 60 Minutes Intravenous Every 12 hours 01/15/14 0309     01/15/14 0000  vancomycin (VANCOCIN) IVPB 1000 mg/200 mL premix     1,000 mg 200 mL/hr over 60 Minutes Intravenous  Once 01/14/14 2351 01/15/14 0147       Assessment/Plan Right breast cellulitis/abscess POD #3 s/p I&D right breast abscess H/o implant removal  RA on Humira  Chronic back pain - on fentanyl patch   Plan:  1.  Improving, but pain control the problem 2.  Dressing changes BID 3.  IV Vanc #5 and Cefepime Day #2 4.  Await culture results (NGTD) and appreciate ID's help in managing her ABX.  May need to send home on IV abx if  she's not able to tolerate PO. 5.  Ambulate and IS 6.  SCD's and okay to be on DVT proph per medicine service    LOS: 5 days    [name] 01/19/2014, 7:35 AM Pager: [phone]

## 2014-01-19 NOTE — Progress Notes (Signed)
INFECTIOUS DISEASE PROGRESS NOTE  ID: Kara [name] is a 58 y.o. female with  Principal Problem:   Cellulitis of female breast Active Problems:   LOW BACK PAIN   History of asthma   Rheumatoid arthritis   Fibromyalgia   Depression   Abscess of right breast  Subjective: Without complaints  Abtx:  Anti-infectives   Start     Dose/Rate Route Frequency Ordered Stop   01/18/14 1200  fluconazole (DIFLUCAN) tablet 150 mg  Status:  Discontinued     150 mg Oral Daily 01/18/14 1105 01/18/14 1122   01/18/14 1200  fluconazole (DIFLUCAN) tablet 150 mg     150 mg Oral  Once 01/18/14 1122 01/18/14 1623   01/17/14 2000  ceFEPIme (MAXIPIME) 2 g in dextrose 5 % 50 mL IVPB     2 g 100 mL/hr over 30 Minutes Intravenous Every 12 hours 01/17/14 1841     01/15/14 1400  vancomycin (VANCOCIN) 750 mg in sodium chloride 0.9 % 150 mL IVPB     750 mg 150 mL/hr over 60 Minutes Intravenous Every 12 hours 01/15/14 0309     01/15/14 0000  vancomycin (VANCOCIN) IVPB 1000 mg/200 mL premix     1,000 mg 200 mL/hr over 60 Minutes Intravenous  Once 01/14/14 2351 01/15/14 0147      Medications:  Scheduled: . amphetamine-dextroamphetamine  30 mg Oral Q24H  . amphetamine-dextroamphetamine  60 mg Oral QAC breakfast  . ceFEPime (MAXIPIME) IV  2 g Intravenous Q12H  . clotrimazole  1 Applicatorful Vaginal QHS  . diphenhydrAMINE  25 mg Intravenous Once  . fentaNYL  50 mcg Transdermal Q72H  . meloxicam  15 mg Oral Daily  . vancomycin (VANCOCIN) 750 mg IVPB  750 mg Intravenous Q12H    Objective: Vital signs in last 24 hours: Temp:  [97.8 F (36.6 C)-98.4 F (36.9 C)] 98.4 F (36.9 C) (10/26 1400) Pulse Rate:  [80-82] 82 (10/26 1400) Resp:  [16-18] 18 (10/26 1400) BP: (79-145)/(55-88) 104/79 mmHg (10/26 1400) SpO2:  [95 %-100 %] 100 % (10/26 1400)   General appearance: alert, cooperative and no distress Chest wall: no tenderness, examined with nurse- wounds clean, significant decrease in induration under  superior wound. decreased erythema around both wounds.   Lab Results  Recent Labs  01/18/14 0616 01/19/14 0505  WBC 10.1 9.8  HGB 10.7* 11.4*  HCT 34.0* 35.4*  NA 141 141  K 3.9 3.6*  CL 102 100  CO2 29 29  BUN 9 11  CREATININE 0.64 0.65   Liver Panel No results found for this basename: PROT, ALBUMIN, AST, ALT, ALKPHOS, BILITOT, BILIDIR, IBILI,  in the last 72 hours Sedimentation Rate No results found for this basename: ESRSEDRATE,  in the last 72 hours C-Reactive Protein No results found for this basename: CRP,  in the last 72 hours  Microbiology: Recent Results (from the past 240 hour(s))  WOUND CULTURE     Status: None   Collection Time    01/15/14 12:47 PM      Result Value Ref Range Status   Specimen Description BREAST RIGHT   Final   Special Requests Immunocompromised   Final   Gram Stain     Final   Value: NO WBC SEEN     NO SQUAMOUS EPITHELIAL CELLS SEEN     NO ORGANISMS SEEN     Performed at [facility]   Culture     Final   Value: NO GROWTH 2 DAYS  Performed at [facility]   Report Status 01/17/2014 FINAL   Final  SURGICAL PCR SCREEN     Status: None   Collection Time    01/15/14  9:53 PM      Result Value Ref Range Status   MRSA, PCR NEGATIVE  NEGATIVE Final   Staphylococcus aureus NEGATIVE  NEGATIVE Final   Comment:            The Xpert SA Assay (FDA     approved for NASAL specimens     in patients over 21 years of age),     is one component of     a comprehensive surveillance     program.  Test performance has     been validated by [employer] for patients greater     than or equal to 72 year old.     It is not intended     to diagnose infection nor to     guide or monitor treatment.  ANAEROBIC CULTURE     Status: None   Collection Time    01/16/14 11:03 AM      Result Value Ref Range Status   Specimen Description ABSCESS RIGHT BREAST   Final   Special Requests NONE   Final   Gram Stain     Final   Value: FEW  WBC PRESENT,BOTH PMN AND MONONUCLEAR     NO SQUAMOUS EPITHELIAL CELLS SEEN     NO ORGANISMS SEEN     Performed at [facility]   Culture     Final   Value: NO ANAEROBES ISOLATED; CULTURE IN PROGRESS FOR 5 DAYS     Performed at [facility]   Report Status PENDING   Incomplete  CULTURE, ROUTINE-ABSCESS     Status: None   Collection Time    01/16/14 11:03 AM      Result Value Ref Range Status   Specimen Description ABSCESS RIGHT BREAST   Final   Special Requests NONE   Final   Gram Stain     Final   Value: FEW WBC PRESENT,BOTH PMN AND MONONUCLEAR     NO SQUAMOUS EPITHELIAL CELLS SEEN     NO ORGANISMS SEEN     Performed at [facility]   Culture     Final   Value: NO GROWTH 3 DAYS     Performed at [facility]   Report Status 01/19/2014 FINAL   Final    Studies/Results: No results found.   Assessment/Plan: Wound Infection R breast  Cellulitis  RA  Fibromyalgia  Total days of antibiotics: 4 vanco, 2 cefepime  Cx (-) Wounds are much better.  Pt wants to try po anbx today Will give her test dose of bactrim today.  We also discussed PIC line, she would like to hold on this for now.          Johny Sax Infectious Diseases (pager) [phone] [email or web address] 01/19/2014, 3:53 PM  LOS: 5 days

## 2014-01-19 NOTE — Progress Notes (Signed)
Patient seen and examined.  Cellulitis has resolved.  Needs daily normal saline wet to dry dressing change at home.  Ready for discharge from surgical standpoint when antibiotic treatment work out.

## 2014-01-20 DIAGNOSIS — R110 Nausea: Secondary | ICD-10-CM

## 2014-01-20 DIAGNOSIS — N760 Acute vaginitis: Secondary | ICD-10-CM

## 2014-01-20 DIAGNOSIS — F909 Attention-deficit hyperactivity disorder, unspecified type: Secondary | ICD-10-CM

## 2014-01-20 LAB — CBC
HCT: 36.4 % (ref 36.0–46.0)
Hemoglobin: 11.8 g/dL — ABNORMAL LOW (ref 12.0–15.0)
MCH: 26.8 pg (ref 26.0–34.0)
MCHC: 32.4 g/dL (ref 30.0–36.0)
MCV: 82.5 fL (ref 78.0–100.0)
PLATELETS: 512 10*3/uL — AB (ref 150–400)
RBC: 4.41 MIL/uL (ref 3.87–5.11)
RDW: 12.8 % (ref 11.5–15.5)
WBC: 9.7 10*3/uL (ref 4.0–10.5)

## 2014-01-20 LAB — BASIC METABOLIC PANEL
ANION GAP: 12 (ref 5–15)
BUN: 13 mg/dL (ref 6–23)
CALCIUM: 9.2 mg/dL (ref 8.4–10.5)
CO2: 28 mEq/L (ref 19–32)
CREATININE: 0.66 mg/dL (ref 0.50–1.10)
Chloride: 98 mEq/L (ref 96–112)
GFR calc Af Amer: >90 mL/min (ref >90)
Glucose, Bld: 96 mg/dL (ref 70–99)
Potassium: 4.1 mEq/L (ref 3.7–5.3)
Sodium: 138 mEq/L (ref 137–147)

## 2014-01-20 MED ORDER — VANCOMYCIN HCL 1000 MG IV SOLR: 750.0000 mg | Freq: Two times a day (BID) | INTRAVENOUS | Status: AC

## 2014-01-20 MED ORDER — SODIUM CHLORIDE 0.9 % IJ SOLN: 10.0000 mL | Freq: Two times a day (BID) | INTRAMUSCULAR | Status: DC

## 2014-01-20 MED ORDER — DEXTROSE 5 % IV SOLN: 2.0000 g | Freq: Two times a day (BID) | INTRAVENOUS | Status: AC

## 2014-01-20 MED ORDER — SODIUM CHLORIDE 0.9 % IJ SOLN: 10.0000 mL | INTRAMUSCULAR | Status: DC | PRN

## 2014-01-20 NOTE — Progress Notes (Addendum)
INFECTIOUS DISEASE PROGRESS NOTE  ID: Kara [name] is a 58 y.o. female with  Principal Problem:   Cellulitis of female breast Active Problems:   LOW BACK PAIN   History of asthma   Rheumatoid arthritis   Fibromyalgia   Depression   Abscess of right breast  Subjective: C/o vaginitis. Nausea. Low grade temp.   Abtx:  Anti-infectives   Start     Dose/Rate Route Frequency Ordered Stop   01/20/14 0000  vancomycin 750 mg in sodium chloride 0.9 % 150 mL     750 mg 150 mL/hr over 60 Minutes Intravenous Every 12 hours 01/20/14 1016 01/29/14 2359   01/20/14 0000  ceFEPIme 2 g in dextrose 5 % 50 mL     2 g 100 mL/hr over 30 Minutes Intravenous Every 12 hours 01/20/14 1016 01/31/14 2359   01/19/14 2200  sulfamethoxazole-trimethoprim (BACTRIM DS) 800-160 MG per tablet 1 tablet  Status:  Discontinued     1 tablet Oral Every 12 hours 01/19/14 1558 01/20/14 1012   01/18/14 1200  fluconazole (DIFLUCAN) tablet 150 mg  Status:  Discontinued     150 mg Oral Daily 01/18/14 1105 01/18/14 1122   01/18/14 1200  fluconazole (DIFLUCAN) tablet 150 mg     150 mg Oral  Once 01/18/14 1122 01/18/14 1623   01/17/14 2000  ceFEPIme (MAXIPIME) 2 g in dextrose 5 % 50 mL IVPB     2 g 100 mL/hr over 30 Minutes Intravenous Every 12 hours 01/17/14 1841     01/15/14 1400  vancomycin (VANCOCIN) 750 mg in sodium chloride 0.9 % 150 mL IVPB     750 mg 150 mL/hr over 60 Minutes Intravenous Every 12 hours 01/15/14 0309     01/15/14 0000  vancomycin (VANCOCIN) IVPB 1000 mg/200 mL premix     1,000 mg 200 mL/hr over 60 Minutes Intravenous  Once 01/14/14 2351 01/15/14 0147      Medications:  Scheduled: . amphetamine-dextroamphetamine  30 mg Oral Q24H  . amphetamine-dextroamphetamine  60 mg Oral QAC breakfast  . ceFEPime (MAXIPIME) IV  2 g Intravenous Q12H  . clotrimazole  1 Applicatorful Vaginal QHS  . diphenhydrAMINE  25 mg Intravenous Once  . fentaNYL  50 mcg Transdermal Q72H  . meloxicam  15 mg Oral Daily  .  sodium chloride  10-40 mL Intracatheter Q12H  . vancomycin (VANCOCIN) 750 mg IVPB  750 mg Intravenous Q12H    Objective: Vital signs in last 24 hours: Temp:  [98.3 F (36.8 C)-99.5 F (37.5 C)] 99.5 F (37.5 C) (10/27 1400) Pulse Rate:  [76-81] 79 (10/27 1400) Resp:  [16] 16 (10/27 1400) BP: (75-127)/(43-91) 127/91 mmHg (10/27 1400) SpO2:  [95 %-100 %] 100 % (10/27 1400)   General appearance: alert, cooperative and no distress Resp: rhonchi bilaterally Cardio: regular rate and rhythm GI: normal findings: bowel sounds normal and soft, non-tender Extremities: PIC LUE, peripheral IV RUE  Lab Results  Recent Labs  01/19/14 0505 01/20/14 0502  WBC 9.8 9.7  HGB 11.4* 11.8*  HCT 35.4* 36.4  NA 141 138  K 3.6* 4.1  CL 100 98  CO2 29 28  BUN 11 13  CREATININE 0.65 0.66   Liver Panel No results found for this basename: PROT, ALBUMIN, AST, ALT, ALKPHOS, BILITOT, BILIDIR, IBILI,  in the last 72 hours Sedimentation Rate No results found for this basename: ESRSEDRATE,  in the last 72 hours C-Reactive Protein No results found for this basename: CRP,  in the last 72 hours  Microbiology: Recent Results (from the past 240 hour(s))  WOUND CULTURE     Status: None   Collection Time    01/15/14 12:47 PM      Result Value Ref Range Status   Specimen Description BREAST RIGHT   Final   Special Requests Immunocompromised   Final   Gram Stain     Final   Value: NO WBC SEEN     NO SQUAMOUS EPITHELIAL CELLS SEEN     NO ORGANISMS SEEN     Performed at [facility]   Culture     Final   Value: NO GROWTH 2 DAYS     Performed at [facility]   Report Status 01/17/2014 FINAL   Final  SURGICAL PCR SCREEN     Status: None   Collection Time    01/15/14  9:53 PM      Result Value Ref Range Status   MRSA, PCR NEGATIVE  NEGATIVE Final   Staphylococcus aureus NEGATIVE  NEGATIVE Final   Comment:            The Xpert SA Assay (FDA     approved for NASAL specimens     in  patients over 60 years of age),     is one component of     a comprehensive surveillance     program.  Test performance has     been validated by [employer] for patients greater     than or equal to 32 year old.     It is not intended     to diagnose infection nor to     guide or monitor treatment.  ANAEROBIC CULTURE     Status: None   Collection Time    01/16/14 11:03 AM      Result Value Ref Range Status   Specimen Description ABSCESS RIGHT BREAST   Final   Special Requests NONE   Final   Gram Stain     Final   Value: FEW WBC PRESENT,BOTH PMN AND MONONUCLEAR     NO SQUAMOUS EPITHELIAL CELLS SEEN     NO ORGANISMS SEEN     Performed at [facility]   Culture     Final   Value: NO ANAEROBES ISOLATED; CULTURE IN PROGRESS FOR 5 DAYS     Performed at [facility]   Report Status PENDING   Incomplete  CULTURE, ROUTINE-ABSCESS     Status: None   Collection Time    01/16/14 11:03 AM      Result Value Ref Range Status   Specimen Description ABSCESS RIGHT BREAST   Final   Special Requests NONE   Final   Gram Stain     Final   Value: FEW WBC PRESENT,BOTH PMN AND MONONUCLEAR     NO SQUAMOUS EPITHELIAL CELLS SEEN     NO ORGANISMS SEEN     Performed at [facility]   Culture     Final   Value: NO GROWTH 3 DAYS     Performed at [facility]   Report Status 01/19/2014 FINAL   Final    Studies/Results: No results found.   Assessment/Plan: Wound Infection R breast  Cellulitis  RA  Fibromyalgia  Total days of antibiotics 5 vanco, 3 cefepime  Would: Stop anbx on 11-5 Fluconazole/clotrimazole for vaginitis Has surgical f/u NO DMARDS til infection clear Glad to see in ID as needed.  Johny Sax Infectious Diseases (pager) [phone] [email or web address] 01/20/2014, 4:12 PM  LOS: 6 days

## 2014-01-20 NOTE — Progress Notes (Addendum)
Patient states that she is feeling sick to her stomach.  Dr Elisabeth Pigeon paged.  Patient vital signs are stable and has not vomited with staff. Spoke with patient explained that she will receive antibiotics at home in am by [facility].  Patient states OK wanted IV pain med and to wait for husband to leave work.  Questions answered. IV Maxipime to infuse at 1900

## 2014-01-20 NOTE — Progress Notes (Signed)
[facility]  Patient Status:   New pt this admission for [facility]  [facility] is providing the following services: HHRN and [facility]fusion [facility] for home IV ABX. [facility] hospital infusion coordinator will support in hospital IV ABX teaching to support independence at home. We will follow pt until ready for DC to support transition home.   If patient discharges after hours, please call [phone].   Sedalia Muta 01/20/2014, 12:23 PM

## 2014-01-20 NOTE — Progress Notes (Signed)
POD#4 from complex I & D of right breast abscess.  Afebrile  Right breast wounds are clean, no surrounding erythema or induration.  Imp: Infection appears resolved to me and cultures negative.    Plan:  Follow up with Dr. Johna Sheriff in our office in 2 weeks [phone] to make appointment).  Daily dressing changes at home (will need home health).

## 2014-01-20 NOTE — Progress Notes (Signed)
Peripherally Inserted Central Catheter/Midline Placement  The IV Nurse has discussed with the patient and/or persons authorized to consent for the patient, the purpose of this procedure and the potential benefits and risks involved with this procedure.  The benefits include less needle sticks, lab draws from the catheter and patient may be discharged home with the catheter.  Risks include, but not limited to, infection, bleeding, blood clot (thrombus formation), and puncture of an artery; nerve damage and irregular heat beat.  Alternatives to this procedure were also discussed.  PICC/Midline Placement Documentation        [name] 01/20/2014, 12:27 PM Consent obtained by Merleen Milliner, RN, CRNI

## 2014-01-20 NOTE — Discharge Summary (Signed)
Physician Discharge Summary  Kara [name] HQP:[id number] DOB: [birth year] DOA: 01/14/2014  PCP: [name], MD  Admit date: 01/14/2014 Discharge date: 01/20/2014  Recommendations for Outpatient Follow-up:  1. Continue vancomycin and cefepime as prescribed. Vancomycin until 01/29/2014 and cefepime until 01/31/2014. 2. Home health orders are in place which include RN for antibiotic management as well as dressing changes. 3. Pt refused to be on PO meds, arguing that she only takes adderall and pain meds in am.  Of note, patient very rude, her daughter at the bedside is yelling and constantly interfering with my explanations. Patient herself is inquiring about prescribing her pain meds which i refused to do because she is following with the pain management clinic. I also confirmed with her PCP that he is not prescribing her pain meds either.   Discharge Diagnoses:  Principal Problem:   Cellulitis of female breast Active Problems:   LOW BACK PAIN   History of asthma   Rheumatoid arthritis   Fibromyalgia   Depression   Abscess of right breast    Discharge Condition: stable   Diet recommendation: as tolerated   History of present illness:  58 y.o. female with history of fibromyalgia, on chronic pain meds, follows with pain management clinic, RA, ADD, depression who presented to [facility] ED 01/14/2014 with right breast redness, tenderness with an abscess. She recently had breast implant removal. She was in [facility] treated with Augmentin, discharged home 01/11/2014 but came back due to worsening symptoms.   Hospital Course:   Principal Problem: Postoperative right breast abscess/cellulitis   Status post incision and drainage of right breast abscess. Infectious disease and surgery following.   Pt will continue taking vanco and cefepime as prescribed: Vancomycin until 01/29/2014 and cefepime until 01/31/2014.  Pt refused to take PO meds saying that in am her priority is to take pain meds  and adderall   HIV and hepatitis panel negative.  Active Problems: Rheumatoid arthritis / Chronic pain syndrome / Fibromyalgia   Immunosuppressive medications on hold secondary to current breast cellulitis. Depression/anxiety   Stable. Continue Xanax as needed.   .  Code Status: Full  Family Communication: Updated patient at bedside.   Consultants:  General surgery: Dr. Johna Sheriff 01/15/2014  ID: Dr. Ninetta Lights 01/17/2014 Procedures:  Ultrasound of the right breast 01/15/2014  Incision and drainage of right breast abscess/cellulitis 01/16/2014 Dr. Johna Sheriff Antibiotics:  IV vancomycin 01/15/2014  IV cefepime 01/17/2014   Signed:  Manson Passey, MD  [facility] 01/20/2014, 10:16 AM  Pager #: [phone]   Discharge Exam: Filed Vitals:   01/20/14 0736  BP: 100/64  Pulse: 80  Temp:   Resp:    Filed Vitals:   01/19/14 1400 01/19/14 2138 01/20/14 0600 01/20/14 0736  BP: 104/79 108/63 75/43 100/64  Pulse: 82 81 76 80  Temp: 98.4 F (36.9 C) 98.7 F (37.1 C) 98.3 F (36.8 C)   TempSrc: Oral Oral Oral   Resp: 18 16 16    Height:      Weight:      SpO2: 100% 100% 95%     General: Pt is alert, follows commands appropriately, not in acute distress Cardiovascular: Regular rate and rhythm, S1/S2 +, no murmurs; dressing over right breast, packing in place. Respiratory: Clear to auscultation bilaterally, no wheezing, no crackles, no rhonchi Abdominal: Soft, non tender, non distended, bowel sounds +, no guarding Extremities: no edema, no cyanosis, pulses palpable bilaterally DP and PT Neuro: Grossly nonfocal  Discharge Instructions  Discharge Instructions   Call MD for:  difficulty breathing, headache or visual disturbances    Complete by:  As directed      Call MD for:  persistant dizziness or light-headedness    Complete by:  As directed      Call MD for:  persistant nausea and vomiting    Complete by:  As directed      Call MD for:  severe uncontrolled  pain    Complete by:  As directed      Diet - low sodium heart healthy    Complete by:  As directed      Discharge instructions    Complete by:  As directed   1. Continue vancomycin and cefepime as prescribed. Vancomycin until 01/29/2014 and cefepime until 01/31/2014. 2. Home health orders are in place which include RN for antibiotic management as well as dressing changes     Increase activity slowly    Complete by:  As directed             Medication List         ALPRAZolam 0.5 MG tablet  Commonly known as:  XANAX  Take 0.5 mg by mouth 3 (three) times daily as needed for anxiety.     amphetamine-dextroamphetamine 30 MG tablet  Commonly known as:  ADDERALL  Take 30 mg by mouth 3 (three) times daily. 60 mg in Am and 30 mg at 4PM     ceFEPIme 2 g in dextrose 5 % 50 mL  Inject 2 g into the vein every 12 (twelve) hours.     fentaNYL 50 MCG/HR  Commonly known as:  DURAGESIC - dosed mcg/hr  Place 50 mcg onto the skin every 3 (three) days.     HYDROmorphone 2 MG tablet  Commonly known as:  DILAUDID  Take 2-4 mg by mouth every 4 (four) hours as needed for severe pain.     meloxicam 15 MG tablet  Commonly known as:  MOBIC  Take 15 mg by mouth daily.     ondansetron 8 MG disintegrating tablet  Commonly known as:  ZOFRAN ODT  Take 1 tablet (8 mg total) by mouth every 8 (eight) hours as needed for nausea or vomiting.     oxyCODONE-acetaminophen 10-325 MG per tablet  Commonly known as:  PERCOCET  Take 1 tablet by mouth every 8 (eight) hours as needed for pain.     vancomycin 750 mg in sodium chloride 0.9 % 150 mL  Inject 750 mg into the vein every 12 (twelve) hours.     zolpidem 10 MG tablet  Commonly known as:  AMBIEN  Take 10 mg by mouth at bedtime as needed for sleep.           Follow-up Information   Follow up with [name], MD In 1 week. (Follow up appt after recent hospitalization)    Specialty:  Family Medicine   Contact information:   [address] [place] [address] [phone]        The results of significant diagnostics from this hospitalization (including imaging, microbiology, ancillary and laboratory) are listed below for reference.    Significant Diagnostic Studies: Dg Chest 2 View  01/10/2014   CLINICAL DATA:  Leukocytosis.  History of asthma  EXAM: CHEST  2 VIEW  COMPARISON:  02/11/2013  FINDINGS: The heart size and mediastinal contours are within normal limits. Both lungs are clear. Granuloma identified within the left midlung. Previous right shoulder arthroplasty.  IMPRESSION: No active cardiopulmonary disease.   Electronically Signed  By: Signa Kell M.D.   On: 01/10/2014 10:50   US Breast Complete Uni Right Inc Axilla  01/15/2014   CLINICAL DATA:  59 year old female with history of right breast collection, drainage and recent drain removal. Right breast cellulitis and increasing pain.  EXAM: ULTRASOUND OF THE RIGHT BREAST  COMPARISON:  01/09/2014.  FINDINGS: Ultrasound is performed, showing a 0.6 x 1 x 3 cm ill-defined collection at the 5 o'clock position of the right breast 3-4 cm from the nipple. This has a similar appearance to the prior study, but now without a drain identified.  No other abnormalities are identified within the right breast.  IMPRESSION: 0.6 x 1 x 3 cm persistent collection within the inner lower right breast. This may represent a postoperative collection or hematoma with infection not excluded.  Recommend bilateral mammograms when clinically able to resume annual mammogram schedule.   Electronically Signed   By: Laveda Abbe M.D.   On: 01/15/2014 13:35   US Breast Ltd Uni Right Inc Axilla  01/09/2014   CLINICAL DATA:  Painful mass in the RIGHT breast.  EXAM: ULTRASOUND OF THE RIGHT BREAST  COMPARISON:  None.  FINDINGS: Ultrasound is performed, showing focused ultrasound scanning over the area palpable abnormality demonstrates heterogeneous hypoechoic region that probably represents a  collapsed seroma are hematoma. Penrose drain is present. There is no drainable fluid collection or abscess.  IMPRESSION: Probable abnormality corresponds with what appears to be a collapsed cavity with Penrose drain.   Electronically Signed   By: Andreas Newport M.D.   On: 01/09/2014 11:26    Microbiology: Recent Results (from the past 240 hour(s))  WOUND CULTURE     Status: None   Collection Time    01/15/14 12:47 PM      Result Value Ref Range Status   Specimen Description BREAST RIGHT   Final   Special Requests Immunocompromised   Final   Gram Stain     Final   Value: NO WBC SEEN     NO SQUAMOUS EPITHELIAL CELLS SEEN     NO ORGANISMS SEEN     Performed at [facility]   Culture     Final   Value: NO GROWTH 2 DAYS     Performed at [facility]   Report Status 01/17/2014 FINAL   Final  SURGICAL PCR SCREEN     Status: None   Collection Time    01/15/14  9:53 PM      Result Value Ref Range Status   MRSA, PCR NEGATIVE  NEGATIVE Final   Staphylococcus aureus NEGATIVE  NEGATIVE Final   Comment:            The Xpert SA Assay (FDA     approved for NASAL specimens     in patients over 41 years of age),     is one component of     a comprehensive surveillance     program.  Test performance has     been validated by [employer] for patients greater     than or equal to 29 year old.     It is not intended     to diagnose infection nor to     guide or monitor treatment.  ANAEROBIC CULTURE     Status: None   Collection Time    01/16/14 11:03 AM      Result Value Ref Range Status   Specimen Description ABSCESS RIGHT BREAST   Final  Special Requests NONE   Final   Gram Stain     Final   Value: FEW WBC PRESENT,BOTH PMN AND MONONUCLEAR     NO SQUAMOUS EPITHELIAL CELLS SEEN     NO ORGANISMS SEEN     Performed at [facility]   Culture     Final   Value: NO ANAEROBES ISOLATED; CULTURE IN PROGRESS FOR 5 DAYS     Performed at [facility]   Report  Status PENDING   Incomplete  CULTURE, ROUTINE-ABSCESS     Status: None   Collection Time    01/16/14 11:03 AM      Result Value Ref Range Status   Specimen Description ABSCESS RIGHT BREAST   Final   Special Requests NONE   Final   Gram Stain     Final   Value: FEW WBC PRESENT,BOTH PMN AND MONONUCLEAR     NO SQUAMOUS EPITHELIAL CELLS SEEN     NO ORGANISMS SEEN     Performed at [facility]   Culture     Final   Value: NO GROWTH 3 DAYS     Performed at [facility]   Report Status 01/19/2014 FINAL   Final     Labs: Basic Metabolic Panel:  Recent Labs Lab 01/15/14 0525 01/16/14 0801 01/17/14 0557 01/18/14 0616 01/19/14 0505 01/20/14 0502  NA 140 140 139 141 141 138  K 3.6* 4.3 3.7 3.9 3.6* 4.1  CL 103 105 102 102 100 98  CO2 27 26 24 29 29 28   GLUCOSE 115* 88 187* 90 108* 96  BUN 18 8 6 9 11 13   CREATININE 0.62 0.64 0.53 0.64 0.65 0.66  CALCIUM 9.1 8.7 9.3 9.4 9.1 9.2  MG 2.1  --   --   --   --   --   PHOS 4.0  --   --   --   --   --    Liver Function Tests:  Recent Labs Lab 01/15/14 0525  AST 14  ALT 13  ALKPHOS 120*  BILITOT <0.2*  PROT 6.6  ALBUMIN 3.3*   No results found for this basename: LIPASE, AMYLASE,  in the last 168 hours No results found for this basename: AMMONIA,  in the last 168 hours CBC:  Recent Labs Lab 01/14/14 2350  01/16/14 0801 01/17/14 0557 01/18/14 0616 01/19/14 0505 01/20/14 0502  WBC 9.4  < > 6.1 11.2* 10.1 9.8 9.7  NEUTROABS 3.8  --  2.0  --   --   --   --   HGB 12.5  < > 10.7* 11.1* 10.7* 11.4* 11.8*  HCT 38.4  < > 33.3* 34.1* 34.0* 35.4* 36.4  MCV 83.1  < > 84.1 82.6 83.7 82.3 82.5  PLT 594*  < > 471* 540* 555* 512* 512*  < > = values in this interval not displayed. Cardiac Enzymes: No results found for this basename: CKTOTAL, CKMB, CKMBINDEX, TROPONINI,  in the last 168 hours BNP: BNP (last 3 results) No results found for this basename: PROBNP,  in the last 8760 hours CBG: No results found for this  basename: GLUCAP,  in the last 168 hours  Time coordinating discharge: Over 30 minutes

## 2014-01-20 NOTE — Care Management Note (Signed)
    Page 1 of 1   01/20/2014     11:21:35 AM CARE MANAGEMENT NOTE 01/20/2014  Patient:  Kara [name], Kara [name]   Account Number:  [id number]  Date Initiated:  01/15/2014  Documentation initiated by:  Lorenda Ishihara  Subjective/Objective Assessment:   58 yo female admitted with cellulitis of breast, failed OP rx. PTA lived at home with spouse.     Action/Plan:   Home when stable   Anticipated DC Date:  01/22/2014   Anticipated DC Plan:  HOME W HOME HEALTH SERVICES      DC Planning Services  CM consult      [facility]  HOME HEALTH   Choice offered to / List presented to:  C-1 Patient        HH arranged  HH-1 RN  HH-10 DISEASE MANAGEMENT      HH agency  [facility]   Status of service:  Completed, signed off Medicare Important Message given?   (If response is "NO", the following Medicare IM given date fields will be blank) Date Medicare IM given:   Medicare IM given by:   Date Additional Medicare IM given:   Additional Medicare IM given by:    Discharge Disposition:  HOME W HOME HEALTH SERVICES  Per UR Regulation:  Reviewed for med. necessity/level of care/duration of stay  If discussed at Long Length of Stay Meetings, dates discussed:    Comments:  01-20-14 Lorenda Ishihara RN CM [id number] Patient for d/c home today. PICC to be placed then home on IV abx. Discussed with nursed to give evening dose of abx and then [facility] would start in am. Dressing change is once a day and has been done.

## 2014-01-20 NOTE — Plan of Care (Signed)
Problem: Phase II Progression Outcomes Goal: Discharge plan established Outcome: Completed/Met Date Met:  01/20/14 [facility] for dressing changes

## 2014-01-20 NOTE — Discharge Instructions (Signed)

## 2014-01-20 NOTE — Progress Notes (Signed)
Patient completed last IV Maxipime. Removed Peripheral IV Line.  Discharge instruction given to patient and daughter and patient verbalized understanding with teach back.  Hemodynamically stable.  Discharged home with daughter at her side.  Pt. Refused to use wheelchair to leave the hospital or have Nurse go downstairs with her.  Charise Carwin, RN.

## 2014-01-20 NOTE — Progress Notes (Signed)
Patient concerned about medication for pain at discharge.  Dr Lenise Arena made aware of patient concerns.  Instructed by Dr Lenise Arena that patient would have to contact the pain clinic for pain medications.  Scripts available for home health antibiotics.  Discussed with patient.  Patient stated understanding no questions at this time

## 2014-01-21 LAB — ANAEROBIC CULTURE

## 2014-01-22 ENCOUNTER — Ambulatory Visit (INDEPENDENT_AMBULATORY_CARE_PROVIDER_SITE_OTHER): Payer: Managed Care, Other (non HMO) | Admitting: Family Medicine

## 2014-01-22 ENCOUNTER — Encounter: Payer: Self-pay | Admitting: Family Medicine

## 2014-01-22 ENCOUNTER — Telehealth: Payer: Self-pay | Admitting: Family Medicine

## 2014-01-22 VITALS — BP 130/80 | HR 80 | Temp 98.1°F | Wt 146.0 lb

## 2014-01-22 DIAGNOSIS — N611 Abscess of the breast and nipple: Secondary | ICD-10-CM

## 2014-01-22 DIAGNOSIS — N61 Inflammatory disorders of breast: Secondary | ICD-10-CM

## 2014-01-22 NOTE — Telephone Encounter (Signed)
Kara [name] called to say that pt was sent home with a pic line and nurses cam out yesterday to show her how to infuse the antibiotic. Pt said there was no way she could do it it makes her nausea.Was ask if anyone in the home could help her she said no. Case [name] said they found out her husband works from home and her daughter is staying with with her awhile. Pic line remove because pt said she just could not infuse it. Infectious disease told pt that since she no longer has pic line there was nothing else they could do for her since she did not want to keep the pick line and told her to follow up with her pcp . Now patient does not have any antibiotics.    Kara [name] said this is what the patient is telling her and she wanted Dr Caryl Never to beware of what the pt is saying. She said if Dr Caryl Never want to call her he can   Pt has a 2pm appt today    [phone] ext [phone]

## 2014-01-22 NOTE — Progress Notes (Signed)
Pre visit review using our clinic review tool, if applicable. No additional management support is needed unless otherwise documented below in the visit note. 

## 2014-01-22 NOTE — Progress Notes (Signed)
Subjective:    Patient ID: Kara [name], female    DOB: 1955/11/15, 58 y.o.   MRN: [id number]  HPI Patient seen for hospital follow-up. Recent history is that she had breast implant removal. She had development of right breast seroma and abscess. She was on oral antibiotics and was concerned that this was not healing well and was having more induration and went back for repeat admission on 01/14/2014. She underwent incision and drainage of right breast abscess and seroma. She was consulted by ID and prescribed vancomycin and cefepime. Patient was screened for HIV and hepatitis and these were negative.  She was discharged with home health orders for home antibiotic of vancomycin and cefepime until early November.  Patient states that during administration of antibiotic she became nauseous and she demanded that her PICC line be removed. She was then instructed to follow-up here. She has packing in right breast. No fevers or chills. No breast erythema.  Patient had cultures from her surgery and these came back negative. She was on doxycycline and Keflex prior to admission and apparently tolerating these well. Question of nausea with Augmentin.  Hospital discharge and recent labs/cultures from hospital reviewed.  Past Medical History  Diagnosis Date  . Fibromyalgia   . 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 Arthritis   . Rheumatoid arthritis(714.0)     autoimmune arthritis; methotrexate once/week  . Depression     takes meds daily  . Post-nasal drip     hx of  . Other specified rheumatoid arthritis, right shoulder 08/01/2011  . Asthma   . Allergy   . Chronic kidney disease   . Urinary incontinence   . Family history of malignant neoplasm of breast   . Chronic pain   . Cellulitis of breast 11/2013    RIGHT BREAST   Past Surgical History  Procedure Laterality Date  . Shoulder surgery  2012    right  . Back surgery  2011    lower back fusion l4-5, s1    . Bladder surgery  2009  . Tubal ligation  1988  . Breast enhancement surgery  2003  . Liposuction  2003    abdominal  . Tonsillectomy  1987  . Total shoulder arthroplasty  08/01/2011    Procedure: TOTAL SHOULDER ARTHROPLASTY;  Surgeon: Eulas Post, MD;  Location: [facility];  Service: Orthopedics;  Laterality: Right;  Right total shoulder arthroplasty  . Mass excision  11/03/2011    Procedure: MINOR EXCISION OF MASS;  Surgeon: Wyn Forster., MD;  Location: [facility];  Service: Orthopedics;  Laterality: Left;  debride IP joint, cyst excision left index  . Breast surgery  10/2013    REMOVAL OF BREAST IMPLANTS  . Breast implants removed    . Incision and drainage abscess Right 01/16/2014    Procedure: INCISION AND DRAINAGE AND OF RIGHT BREAST ABCESS;  Surgeon: Glenna Fellows, MD;  Location: [facility] ORS;  Service: General;  Laterality: Right;    reports that she has quit smoking. Her smoking use included Cigarettes. She has a 5 pack-year smoking history. She has never used smokeless tobacco. She reports that she does not drink alcohol or use illicit drugs. family history includes Arthritis in her mother; Breast cancer (age of onset: 25) in her maternal aunt; Breast cancer (age of onset: 33) in her mother; COPD in her father; Cancer (age of onset: 6) in her cousin; Colon cancer (age of  onset: 98) in her paternal aunt; Heart disease in her mother; Hypertension in her father; Stomach cancer (age of onset: 63) in her paternal uncle. Allergies  Allergen Reactions  . Aspirin Anaphylaxis  . Ibuprofen Anaphylaxis  . Ativan [Lorazepam] Other (See Comments)    Makes agitated, combative   . Tramadol Hcl Itching and Rash      Review of Systems  Constitutional: Negative for fever and chills.  Respiratory: Negative for shortness of breath.   Cardiovascular: Negative for chest pain.  Gastrointestinal: Negative for nausea and vomiting.       Objective:   Physical Exam   Constitutional: She appears well-developed and well-nourished.  Cardiovascular: Normal rate and regular rhythm.   Pulmonary/Chest: Effort normal and breath sounds normal. No respiratory distress. She has no wheezes. She has no rales.  Right breast examined. She has packing in place. There is no erythema or warmth. Nontender. No fluctuance          Assessment & Plan:  Recent right breast abscess and seroma status post I&D with wound cultures negative. Patient refused further home IV antibiotics and demanded her PICC line be removed. At this point, we've recommended going back on 1 more week of doxycycline and Keflex. She does not have evidence for persistent cellulitis at this time. Follow-up promptly for fevers or chills or signs of infection such as erythema or warmth She will continue with daily wound packing and dressing changes.

## 2014-01-22 NOTE — Patient Instructions (Signed)
Get back on Keflex and Doxycycline and let's plan to take them for another week Let me know if you do not have these antibiotics at home.

## 2014-01-23 NOTE — Telephone Encounter (Signed)
Roberta nurse is following up on pt's visit.  She is very concerned bc pt is not floowing directions from the nurse.  Pt declined home health to help w/ dressing changes.  Jenel Lucks would like a cb. [phone]   Ext [phone]

## 2014-02-02 ENCOUNTER — Telehealth: Payer: Self-pay | Admitting: Family Medicine

## 2014-02-02 NOTE — Telephone Encounter (Signed)
Is it okay to refill?

## 2014-02-02 NOTE — Telephone Encounter (Signed)
Pt would like to know if dr Caryl Never will fill her meloxicam (MOBIC) 15 MG tablets Dr Caryl Never has never filled for her before, but pt states she is out of meds and just saw dr Caryl Never 10/29. [facility]

## 2014-02-02 NOTE — Telephone Encounter (Signed)
We can refill meloxicam 50 mg once daily #30 but no control medications since she is followed by chronic pain management

## 2014-02-03 ENCOUNTER — Telehealth: Payer: Self-pay | Admitting: Family Medicine

## 2014-02-03 MED ORDER — MELOXICAM 15 MG PO TABS: 15.0000 mg | ORAL_TABLET | Freq: Every day | ORAL | Status: DC

## 2014-02-03 NOTE — Telephone Encounter (Signed)
Pt has been doing her own packing of wound. Pt states they are very deep. Pt would ilke to know if home  health  should she come to her home or should she come back into the office to see if this is healing. Pt is concerned bc it is bleeding a lot.

## 2014-02-03 NOTE — Telephone Encounter (Signed)
Rx sent to pharmacy   

## 2014-02-04 ENCOUNTER — Ambulatory Visit (INDEPENDENT_AMBULATORY_CARE_PROVIDER_SITE_OTHER): Payer: Managed Care, Other (non HMO) | Admitting: Family Medicine

## 2014-02-04 ENCOUNTER — Encounter: Payer: Self-pay | Admitting: Family Medicine

## 2014-02-04 VITALS — BP 110/64 | HR 91 | Temp 98.0°F | Wt 147.0 lb

## 2014-02-04 DIAGNOSIS — N644 Mastodynia: Secondary | ICD-10-CM

## 2014-02-04 DIAGNOSIS — N61 Inflammatory disorders of breast: Secondary | ICD-10-CM

## 2014-02-04 DIAGNOSIS — S21001D Unspecified open wound of right breast, subsequent encounter: Secondary | ICD-10-CM

## 2014-02-04 MED ORDER — CEPHALEXIN 500 MG PO CAPS: 500.0000 mg | ORAL_CAPSULE | Freq: Three times a day (TID) | ORAL | Status: DC

## 2014-02-04 MED ORDER — ONDANSETRON 8 MG PO TBDP: 8.0000 mg | ORAL_TABLET | Freq: Three times a day (TID) | ORAL | Status: DC | PRN

## 2014-02-04 NOTE — Patient Instructions (Signed)
Start back Keflex 500 mg 3 times daily. Follow-up promptly for any persistent or increased fever

## 2014-02-04 NOTE — Telephone Encounter (Signed)
Pt is coming in today for visit.

## 2014-02-04 NOTE — Progress Notes (Signed)
Pre visit review using our clinic review tool, if applicable. No additional management support is needed unless otherwise documented below in the visit note. 

## 2014-02-04 NOTE — Progress Notes (Signed)
Subjective:    Patient ID: Kara [name], female    DOB: 28-Oct-1955, 58 y.o.   MRN: [id number]  HPI Patient seen with concerns for left breast redness and fever up to 101 yesterday. Refer to recent note. Recent breast surgery for implant removal.  She had recent abscess right breast with surgical incision and drainage. She has had daily dressing changes to right breast with had some recent bleeding when changing dressing. Her right breast though overall feels well. She's not had any odors or purulent drainage or redness involving the skin of the right breast. Just yesterday noticed a little redness along the inferior aspect of the left breast and fever up to 101.  Around 4 AM she broke her fever and none since then. She started back Keflex yesterday and feels better overall today. She had been prescribed IV antibiotics but she refused further IV treatment with home health was discontinued few weeks ago for IV antibiotic administration but pt refused further home IV antibiotics so home health d/ced.  Past Medical History  Diagnosis Date  . Fibromyalgia   . 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 Arthritis   . Rheumatoid arthritis(714.0)     autoimmune arthritis; methotrexate once/week  . Depression     takes meds daily  . Post-nasal drip     hx of  . Other specified rheumatoid arthritis, right shoulder 08/01/2011  . Asthma   . Allergy   . Chronic kidney disease   . Urinary incontinence   . Family history of malignant neoplasm of breast   . Chronic pain   . Cellulitis of breast 11/2013    RIGHT BREAST   Past Surgical History  Procedure Laterality Date  . Shoulder surgery  2012    right  . Back surgery  2011    lower back fusion l4-5, s1  . Bladder surgery  2009  . Tubal ligation  1988  . Breast enhancement surgery  2003  . Liposuction  2003    abdominal  . Tonsillectomy  1987  . Total shoulder arthroplasty  08/01/2011    Procedure: TOTAL SHOULDER  ARTHROPLASTY;  Surgeon: Eulas Post, MD;  Location: [facility] OR;  Service: Orthopedics;  Laterality: Right;  Right total shoulder arthroplasty  . Mass excision  11/03/2011    Procedure: MINOR EXCISION OF MASS;  Surgeon: Wyn Forster., MD;  Location: [facility];  Service: Orthopedics;  Laterality: Left;  debride IP joint, cyst excision left index  . Breast surgery  10/2013    REMOVAL OF BREAST IMPLANTS  . Breast implants removed    . Incision and drainage abscess Right 01/16/2014    Procedure: INCISION AND DRAINAGE AND OF RIGHT BREAST ABCESS;  Surgeon: Glenna Fellows, MD;  Location: [facility]S;  Service: General;  Laterality: Right;    reports that she has quit smoking. Her smoking use included Cigarettes. She has a 5 pack-year smoking history. She has never used smokeless tobacco. She reports that she does not drink alcohol or use illicit drugs. family history includes Arthritis in her mother; Breast cancer (age of onset: 35) in her maternal aunt; Breast cancer (age of onset: 17) in her mother; COPD in her father; Cancer (age of onset: 41) in her cousin; Colon cancer (age of onset: 85) in her paternal aunt; Heart disease in her mother; Hypertension in her father; Stomach cancer (age of onset: 15) in her paternal uncle. Allergies  Allergen  Reactions  . Aspirin Anaphylaxis  . Ibuprofen Anaphylaxis  . Ativan [Lorazepam] Other (See Comments)    Makes agitated, combative   . Tramadol Hcl Itching and Rash      Review of Systems  Constitutional: Positive for fever and chills.  Respiratory: Negative for shortness of breath.   Gastrointestinal: Positive for nausea. Negative for vomiting.  Neurological: Negative for dizziness and weakness.  Psychiatric/Behavioral: Negative for confusion.       Objective:   Physical Exam  Constitutional: She appears well-developed and well-nourished.  Cardiovascular: Normal rate.   Pulmonary/Chest: Effort normal and breath sounds normal. No  respiratory distress. She has no wheezes. She has no rales.  Skin:  Left breast reveals minimal erythema and minimal warmth and minimal tenderness just inferior to the aerial region. She does not have any fluctuance and no pustules. She has area of mild induration just medial to the left areola.  Right breast reveals excellent granulation tissue. No erythema. No warmth.          Assessment & Plan:  Early cellulitis left breast. [Keflex 500 mg 3 times a day. Prompt surgical referral if this is not improving over the next few days. She does not have any fluctuance to suggest superficial abscess at this time.will check ultrasound to assess for any deeper abscess pockets given recent complications with right breast abscess.  Right breast no cellulitis and wound cavity with good healthy granulation tissue.

## 2014-02-15 ENCOUNTER — Encounter (HOSPITAL_COMMUNITY): Payer: Self-pay

## 2014-02-15 ENCOUNTER — Emergency Department (HOSPITAL_COMMUNITY)
Admission: EM | Admit: 2014-02-15 | Discharge: 2014-02-15 | Discharge status: Against Medical Advice | Payer: Managed Care, Other (non HMO) | Attending: Emergency Medicine | Admitting: Emergency Medicine

## 2014-02-15 DIAGNOSIS — Z792 Long term (current) use of antibiotics: Secondary | ICD-10-CM | POA: Insufficient documentation

## 2014-02-15 DIAGNOSIS — F329 Major depressive disorder, single episode, unspecified: Secondary | ICD-10-CM | POA: Diagnosis not present

## 2014-02-15 DIAGNOSIS — F419 Anxiety disorder, unspecified: Secondary | ICD-10-CM | POA: Diagnosis present

## 2014-02-15 DIAGNOSIS — Z87891 Personal history of nicotine dependence: Secondary | ICD-10-CM | POA: Insufficient documentation

## 2014-02-15 DIAGNOSIS — J45909 Unspecified asthma, uncomplicated: Secondary | ICD-10-CM | POA: Insufficient documentation

## 2014-02-15 DIAGNOSIS — N189 Chronic kidney disease, unspecified: Secondary | ICD-10-CM | POA: Insufficient documentation

## 2014-02-15 DIAGNOSIS — G8929 Other chronic pain: Secondary | ICD-10-CM | POA: Diagnosis not present

## 2014-02-15 DIAGNOSIS — M069 Rheumatoid arthritis, unspecified: Secondary | ICD-10-CM | POA: Diagnosis not present

## 2014-02-15 DIAGNOSIS — Z79899 Other long term (current) drug therapy: Secondary | ICD-10-CM | POA: Insufficient documentation

## 2014-02-15 DIAGNOSIS — R454 Irritability and anger: Secondary | ICD-10-CM

## 2014-02-15 DIAGNOSIS — Z791 Long term (current) use of non-steroidal anti-inflammatories (NSAID): Secondary | ICD-10-CM | POA: Insufficient documentation

## 2014-02-15 DIAGNOSIS — F911 Conduct disorder, childhood-onset type: Secondary | ICD-10-CM | POA: Diagnosis not present

## 2014-02-15 MED ORDER — DIAZEPAM 5 MG PO TABS
5.0000 mg | ORAL_TABLET | Freq: Once | ORAL | Status: AC
  Administered 2014-02-15: 5 mg via ORAL
  Filled 2014-02-15: qty 1
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
## 2014-03-04 ENCOUNTER — Encounter (HOSPITAL_COMMUNITY): Payer: Self-pay | Admitting: Emergency Medicine

## 2014-03-04 ENCOUNTER — Telehealth: Payer: Self-pay | Admitting: Family Medicine

## 2014-03-04 ENCOUNTER — Emergency Department (HOSPITAL_COMMUNITY)
Admission: EM | Admit: 2014-03-04 | Discharge: 2014-03-04 | Disposition: A | Discharge status: Other Acute Inpatient Hospital | Payer: Managed Care, Other (non HMO) | Attending: Emergency Medicine | Admitting: Emergency Medicine

## 2014-03-04 ENCOUNTER — Emergency Department (EMERGENCY_DEPARTMENT_HOSPITAL)
Admission: EM | Admit: 2014-03-04 | Discharge: 2014-03-06 | Disposition: A | Discharge status: Other Acute Inpatient Hospital | Payer: Managed Care, Other (non HMO) | Source: Home / Self Care | Attending: Emergency Medicine | Admitting: Emergency Medicine

## 2014-03-04 DIAGNOSIS — F39 Unspecified mood [affective] disorder: Secondary | ICD-10-CM | POA: Diagnosis present

## 2014-03-04 DIAGNOSIS — Z79899 Other long term (current) drug therapy: Secondary | ICD-10-CM | POA: Insufficient documentation

## 2014-03-04 DIAGNOSIS — R4689 Other symptoms and signs involving appearance and behavior: Secondary | ICD-10-CM

## 2014-03-04 DIAGNOSIS — Z791 Long term (current) use of non-steroidal anti-inflammatories (NSAID): Secondary | ICD-10-CM | POA: Insufficient documentation

## 2014-03-04 DIAGNOSIS — Z008 Encounter for other general examination: Secondary | ICD-10-CM

## 2014-03-04 DIAGNOSIS — N189 Chronic kidney disease, unspecified: Secondary | ICD-10-CM | POA: Diagnosis not present

## 2014-03-04 DIAGNOSIS — M797 Fibromyalgia: Secondary | ICD-10-CM | POA: Insufficient documentation

## 2014-03-04 DIAGNOSIS — J45909 Unspecified asthma, uncomplicated: Secondary | ICD-10-CM | POA: Insufficient documentation

## 2014-03-04 DIAGNOSIS — F1510 Other stimulant abuse, uncomplicated: Secondary | ICD-10-CM | POA: Diagnosis not present

## 2014-03-04 DIAGNOSIS — Z853 Personal history of malignant neoplasm of breast: Secondary | ICD-10-CM | POA: Diagnosis not present

## 2014-03-04 DIAGNOSIS — G8929 Other chronic pain: Secondary | ICD-10-CM | POA: Diagnosis not present

## 2014-03-04 DIAGNOSIS — F332 Major depressive disorder, recurrent severe without psychotic features: Secondary | ICD-10-CM | POA: Diagnosis not present

## 2014-03-04 DIAGNOSIS — Z87891 Personal history of nicotine dependence: Secondary | ICD-10-CM | POA: Insufficient documentation

## 2014-03-04 DIAGNOSIS — F911 Conduct disorder, childhood-onset type: Secondary | ICD-10-CM | POA: Diagnosis not present

## 2014-03-04 DIAGNOSIS — F1310 Sedative, hypnotic or anxiolytic abuse, uncomplicated: Secondary | ICD-10-CM | POA: Insufficient documentation

## 2014-03-04 DIAGNOSIS — R451 Restlessness and agitation: Secondary | ICD-10-CM

## 2014-03-04 DIAGNOSIS — M069 Rheumatoid arthritis, unspecified: Secondary | ICD-10-CM | POA: Insufficient documentation

## 2014-03-04 DIAGNOSIS — R45851 Suicidal ideations: Secondary | ICD-10-CM

## 2014-03-04 LAB — COMPREHENSIVE METABOLIC PANEL
ALT: 17 U/L (ref 0–35)
ANION GAP: 17 — AB (ref 5–15)
AST: 17 U/L (ref 0–37)
Albumin: 3.8 g/dL (ref 3.5–5.2)
Alkaline Phosphatase: 148 U/L — ABNORMAL HIGH (ref 39–117)
BUN: 24 mg/dL — AB (ref 6–23)
CALCIUM: 9.7 mg/dL (ref 8.4–10.5)
CHLORIDE: 100 meq/L (ref 96–112)
CO2: 22 meq/L (ref 19–32)
Creatinine, Ser: 0.69 mg/dL (ref 0.50–1.10)
GFR calc Af Amer: >90 mL/min (ref >90)
GFR calc non Af Amer: >90 mL/min (ref >90)
Glucose, Bld: 90 mg/dL (ref 70–99)
Potassium: 3.9 mEq/L (ref 3.7–5.3)
Sodium: 139 mEq/L (ref 137–147)
Total Protein: 8.2 g/dL (ref 6.0–8.3)

## 2014-03-04 LAB — CBC
HEMATOCRIT: 41.6 % (ref 36.0–46.0)
Hemoglobin: 13 g/dL (ref 12.0–15.0)
MCH: 26.5 pg (ref 26.0–34.0)
MCHC: 31.3 g/dL (ref 30.0–36.0)
MCV: 84.9 fL (ref 78.0–100.0)
Platelets: 283 10*3/uL (ref 150–400)
RBC: 4.9 MIL/uL (ref 3.87–5.11)
RDW: 14.3 % (ref 11.5–15.5)
WBC: 8.6 10*3/uL (ref 4.0–10.5)

## 2014-03-04 LAB — RAPID URINE DRUG SCREEN, HOSP PERFORMED
Amphetamines: POSITIVE — AB
BARBITURATES: NOT DETECTED
Benzodiazepines: POSITIVE — AB
Cocaine: NOT DETECTED
Opiates: NOT DETECTED
Tetrahydrocannabinol: NOT DETECTED

## 2014-03-04 LAB — ETHANOL: Alcohol, Ethyl (B): 54 mg/dL — ABNORMAL HIGH (ref 0–11)

## 2014-03-04 LAB — SALICYLATE LEVEL: Salicylate Lvl: <2 mg/dL — ABNORMAL LOW (ref 2.8–20.0)

## 2014-03-04 LAB — ACETAMINOPHEN LEVEL: Acetaminophen (Tylenol), Serum: <15 ug/mL (ref 10–30)

## 2014-03-04 MED ORDER — ONDANSETRON HCL 4 MG PO TABS
4.0000 mg | ORAL_TABLET | Freq: Three times a day (TID) | ORAL | Status: DC | PRN
Start: 2014-03-04 — End: 2014-03-06
  Filled 2014-03-04: qty 1

## 2014-03-04 MED ORDER — ZOLPIDEM TARTRATE 5 MG PO TABS
5.0000 mg | ORAL_TABLET | Freq: Every evening | ORAL | Status: DC | PRN
  Administered 2014-03-05: 5 mg via ORAL
  Filled 2014-03-04: qty 1

## 2014-03-04 MED ORDER — ALUM & MAG HYDROXIDE-SIMETH 200-200-20 MG/5ML PO SUSP
30.0000 mL | ORAL | Status: DC | PRN
  Filled 2014-03-04: qty 30

## 2014-03-04 MED ORDER — ZIPRASIDONE MESYLATE 20 MG IM SOLR
20.0000 mg | Freq: Once | INTRAMUSCULAR | Status: AC
  Administered 2014-03-04: 20 mg via INTRAMUSCULAR
  Filled 2014-03-04: qty 20

## 2014-03-04 MED ORDER — ACETAMINOPHEN 325 MG PO TABS
650.0000 mg | ORAL_TABLET | ORAL | Status: DC | PRN
  Filled 2014-03-04: qty 2

## 2014-03-04 MED ORDER — STERILE WATER FOR INJECTION IJ SOLN
INTRAMUSCULAR | Status: AC
  Administered 2014-03-04: 1.2 mL
  Filled 2014-03-04: qty 10

## 2014-03-04 NOTE — ED Notes (Signed)
Patient has bandages to bilateral ankles and right underside of her breast. Patient states bandage to breast is from a surgical infection site.

## 2014-03-04 NOTE — Telephone Encounter (Signed)
Pt came in to ask for note for her job. She said her breast has been hurting so she has not been to work and was told by her job that she will need a doctor note. Pt request the note for today 03/04/14 and 03/05/14 .

## 2014-03-04 NOTE — Telephone Encounter (Signed)
yes

## 2014-03-04 NOTE — ED Notes (Signed)
Bed: UJ81 Expected date:  Expected time:  Means of arrival:  Comments: [employer] combative
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
## 2014-03-04 NOTE — ED Provider Notes (Signed)
CSN: [id number]     Arrival date & time 03/04/14  1954 History   First MD Initiated Contact with Patient 03/04/14 1958     Chief Complaint  Patient presents with  . Suicidal     (Consider location/radiation/quality/duration/timing/severity/associated sxs/prior Treatment) HPI Comments: Kara [name] is a 58 year old white female with a history of chronic pain, fibromyalgia, rheumatoid arthritis, depression, who presents from home for expressing suicidal ideation to her husband today.  Per [employer].  Patient had text that her children and told them that she had had a heart attack and died.  GPT, got there, she ran into another room and said that she was going to get a gun and shoot herself in the head.  Husband stated that there was a gun in the room that she had run into.  She was able to be talked out of the room and on the way to the hospital, tried to jump out of a slow-moving car.  Patient reports that she was trying to kill herself by jumping out of the car.  She reports she had a fever today due to a breast infection has otherwise been dealing with multiple chronic medical issues.  She denies nausea, vomiting, diarrhea, abdominal pain.  She states she has had some dysuria.   Past Medical History  Diagnosis Date  . Fibromyalgia   . 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 Arthritis   . Rheumatoid arthritis(714.0)     autoimmune arthritis; methotrexate once/week  . Depression     takes meds daily  . Post-nasal drip     hx of  . Other specified rheumatoid arthritis, right shoulder 08/01/2011  . Asthma   . Allergy   . Chronic kidney disease   . Urinary incontinence   . Family history of malignant neoplasm of breast   . Chronic pain   . Cellulitis of breast 11/2013    RIGHT BREAST   Past Surgical History  Procedure Laterality Date  . Shoulder surgery  2012    right  . Back surgery  2011    lower back fusion l4-5, s1  . Bladder  surgery  2009  . Tubal ligation  1988  . Breast enhancement surgery  2003  . Liposuction  2003    abdominal  . Tonsillectomy  1987  . Total shoulder arthroplasty  08/01/2011    Procedure: TOTAL SHOULDER ARTHROPLASTY;  Surgeon: Eulas Post, MD;  Location: [facility] OR;  Service: Orthopedics;  Laterality: Right;  Right total shoulder arthroplasty  . Mass excision  11/03/2011    Procedure: MINOR EXCISION OF MASS;  Surgeon: Wyn Forster., MD;  Location: [facility];  Service: Orthopedics;  Laterality: Left;  debride IP joint, cyst excision left index  . Breast surgery  10/2013    REMOVAL OF BREAST IMPLANTS  . Breast implants removed    . Incision and drainage abscess Right 01/16/2014    Procedure: INCISION AND DRAINAGE AND OF RIGHT BREAST ABCESS;  Surgeon: Glenna Fellows, MD;  Location: [facility]S;  Service: General;  Laterality: Right;   Family History  Problem Relation Age of Onset  . Arthritis Mother   . Heart disease Mother     ?psvt  . Breast cancer Mother 5    TAH/BSO  . COPD Father   . Hypertension Father   . Breast cancer Maternal Aunt 27    deceased  . Cancer Cousin 70  female; unknown primary  . Colon cancer Paternal Aunt 39    deceased at 40  . Stomach cancer Paternal Uncle 9    deceased at 67   History  Substance Use Topics  . Smoking status: Former Smoker -- 0.50 packs/day for 10 years    Types: Cigarettes  . Smokeless tobacco: Never Used     Comment: smoked for 10 years  . Alcohol Use: No   OB History    No data available     Review of Systems  Unable to perform ROS: Psychiatric disorder  Psychiatric/Behavioral: Positive for agitation.      Allergies  Aspirin; Ibuprofen; Ativan; and Tramadol hcl  Home Medications   Prior to Admission medications   Medication Sig Start Date End Date Taking? Authorizing Provider  ALPRAZolam Prudy Feeler) 0.5 MG tablet Take 0.5 mg by mouth 3 (three) times daily as needed for anxiety.  12/17/13  Yes Historical  Provider, MD  amphetamine-dextroamphetamine (ADDERALL) 30 MG tablet Take 30 mg by mouth 2 (two) times daily. 60 mg in Am and 30 mg at [facility] 01/15/13  Yes Historical Provider, MD  fentaNYL (DURAGESIC - DOSED MCG/HR) 50 MCG/HR Place 50 mcg onto the skin every 3 (three) days.   Yes Historical Provider, MD  meloxicam (MOBIC) 15 MG tablet Take 1 tablet (15 mg total) by mouth daily. 02/03/14  Yes [name], MD  oxyCODONE-acetaminophen (PERCOCET) 10-325 MG per tablet Take 1 tablet by mouth every 8 (eight) hours as needed for pain. 01/11/14  Yes Richarda Overlie, MD  zolpidem (AMBIEN) 10 MG tablet Take 10 mg by mouth at bedtime as needed for sleep.    Yes Historical Provider, MD  cephALEXin (KEFLEX) 500 MG capsule Take 1 capsule (500 mg total) by mouth 3 (three) times daily. Patient not taking: Reported on 03/04/2014 02/04/14   [name], MD  ondansetron (ZOFRAN ODT) 8 MG disintegrating tablet Take 1 tablet (8 mg total) by mouth every 8 (eight) hours as needed for nausea or vomiting. Patient not taking: Reported on 03/04/2014 01/14/14   [name], MD  ondansetron (ZOFRAN ODT) 8 MG disintegrating tablet Take 1 tablet (8 mg total) by mouth every 8 (eight) hours as needed for nausea or vomiting. Patient not taking: Reported on 03/04/2014 02/04/14   [name], MD   BP 156/92 mmHg  Pulse 95  Temp(Src) 97.6 F (36.4 C) (Oral)  Resp 18  SpO2 99% Physical Exam  Constitutional: She is oriented to person, place, and time. She appears well-developed and well-nourished. No distress.  HENT:  Head: Normocephalic and atraumatic.  Mouth/Throat: No oropharyngeal exudate.  Eyes: Pupils are equal, round, and reactive to light.  Neck: Normal range of motion. Neck supple.  Cardiovascular: Normal rate, regular rhythm and normal heart sounds.  Exam reveals no gallop and no friction rub.   No murmur heard. Pulmonary/Chest: Effort normal and breath sounds normal. No respiratory distress. She has no  wheezes. She has no rales.    Abdominal: Soft. Bowel sounds are normal. She exhibits no distension and no mass. There is no tenderness. There is no rebound and no guarding.  Musculoskeletal: Normal range of motion. She exhibits no edema or tenderness.  Neurological: She is alert and oriented to person, place, and time.  Skin: Skin is warm and dry.  Psychiatric: Her mood appears anxious. Her affect is angry and labile. Her speech is rapid and/or pressured. She is agitated, aggressive, hyperactive and combative. She expresses suicidal ideation.  ED Course  Procedures (including critical care time) Labs Review Labs Reviewed  COMPREHENSIVE METABOLIC PANEL - Abnormal; Notable for the following:    BUN 24 (*)    Alkaline Phosphatase 148 (*)    Total Bilirubin <0.2 (*)    Anion gap 17 (*)    All other components within normal limits  ETHANOL - Abnormal; Notable for the following:    Alcohol, Ethyl (B) 54 (*)    All other components within normal limits  SALICYLATE LEVEL - Abnormal; Notable for the following:    Salicylate Lvl <2.0 (*)    All other components within normal limits  URINE RAPID DRUG SCREEN (HOSP PERFORMED) - Abnormal; Notable for the following:    Benzodiazepines POSITIVE (*)    Amphetamines POSITIVE (*)    All other components within normal limits  ACETAMINOPHEN LEVEL  CBC    Imaging Review No results found.   EKG Interpretation None      MDM   Final diagnoses:  Suicidal ideation  Aggressive behavior   Kara [name] is a 58 year old white female with a history of chronic pain, fibromyalgia, rheumatoid arthritis, depression, who presents from home for expressing suicidal ideation to her husband today.  Per [employer].  Patient had text that her children and told them that she had had a heart attack and died.  GPT, got there, she ran into another room and said that she was going to get a gun and shoot herself in the head.  Husband stated that  there was a gun in the room that she had run into.  She was able to be talked out of the room and on the way to the hospital, tried to jump out of a slow-moving car.  Patient reports that she was trying to kill herself by jumping out of the car.  She is agitated, screaming, thrashing around in the bed.  She is tangential and emotionally labile.  IVC filed by myself.  I feel that she will return require inpatient psychiatric treatment.    Toy Cookey, MD 03/05/14 [phone]
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
## 2014-03-04 NOTE — ED Notes (Signed)
Bed: [facility] Expected date:  Expected time:  Means of arrival:  Comments: 
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
## 2014-03-04 NOTE — ED Notes (Signed)
Patient freed her bilateral wrist restraints and began attempting to remove ankle restraints. Patient continues to scream at and threaten staff. Patient is currently handcuffed (bilateral wrists) to bed. Patient has been advised when she can be cooperative she will be released from restraints.

## 2014-03-04 NOTE — ED Notes (Signed)
Bed: IRS85 Expected date:  Expected time:  Means of arrival:  Comments: Hold for [employer]

## 2014-03-04 NOTE — ED Notes (Signed)
Pt was not in room when RN went to check on pt, asked volunteer had they seen a pt. PT stated she did not want our services and left ED.

## 2014-03-04 NOTE — Telephone Encounter (Signed)
Is it okay to write 

## 2014-03-04 NOTE — ED Notes (Signed)
TTS consult in progress. °

## 2014-03-04 NOTE — ED Notes (Signed)
Pt has been placed back in scrubs. Comfort measures in place and pt is resting.
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
## 2014-03-04 NOTE — ED Notes (Signed)
EMERGENCY CONTACT: KORISSA HORSFORD [phone] Password: "[id number]"  Patient husband requesting to speak to psychiatry when they come to see patient. He is aware there is no designated time of day/night for this to occur.

## 2014-03-05 ENCOUNTER — Encounter (HOSPITAL_COMMUNITY): Payer: Self-pay | Admitting: Registered Nurse

## 2014-03-05 ENCOUNTER — Inpatient Hospital Stay (HOSPITAL_COMMUNITY): Admission: AD | Admit: 2014-03-05 | Payer: 59 | Source: Intra-hospital | Admitting: Psychiatry

## 2014-03-05 DIAGNOSIS — F329 Major depressive disorder, single episode, unspecified: Secondary | ICD-10-CM

## 2014-03-05 DIAGNOSIS — R45851 Suicidal ideations: Secondary | ICD-10-CM

## 2014-03-05 DIAGNOSIS — F39 Unspecified mood [affective] disorder: Secondary | ICD-10-CM

## 2014-03-05 DIAGNOSIS — R4689 Other symptoms and signs involving appearance and behavior: Secondary | ICD-10-CM | POA: Insufficient documentation

## 2014-03-05 MED ORDER — ALPRAZOLAM 0.5 MG PO TABS
0.5000 mg | ORAL_TABLET | Freq: Three times a day (TID) | ORAL | Status: DC | PRN
Start: 2014-03-05 — End: 2014-03-06
  Administered 2014-03-05 – 2014-03-06 (×4): 0.5 mg via ORAL
  Filled 2014-03-05 (×4): qty 1

## 2014-03-05 MED ORDER — MELOXICAM 15 MG PO TABS
15.0000 mg | ORAL_TABLET | Freq: Every day | ORAL | Status: DC
  Administered 2014-03-05 – 2014-03-06 (×2): 15 mg via ORAL
  Filled 2014-03-05 (×2): qty 1

## 2014-03-05 MED ORDER — ZIPRASIDONE MESYLATE 20 MG IM SOLR
20.0000 mg | Freq: Once | INTRAMUSCULAR | Status: AC
  Administered 2014-03-05: 20 mg via INTRAMUSCULAR
  Filled 2014-03-05: qty 20

## 2014-03-05 MED ORDER — ZIPRASIDONE MESYLATE 20 MG IM SOLR
10.0000 mg | Freq: Once | INTRAMUSCULAR | Status: AC
  Administered 2014-03-05: 10 mg via INTRAMUSCULAR
  Filled 2014-03-05: qty 20

## 2014-03-05 MED ORDER — OXYCODONE HCL 5 MG PO TABS
5.0000 mg | ORAL_TABLET | Freq: Three times a day (TID) | ORAL | Status: DC | PRN
  Administered 2014-03-05 – 2014-03-06 (×4): 5 mg via ORAL
  Filled 2014-03-05 (×5): qty 1

## 2014-03-05 MED ORDER — OXYCODONE-ACETAMINOPHEN 5-325 MG PO TABS
1.0000 | ORAL_TABLET | Freq: Three times a day (TID) | ORAL | Status: DC | PRN
  Administered 2014-03-05 – 2014-03-06 (×4): 1 via ORAL
  Filled 2014-03-05 (×5): qty 1

## 2014-03-05 MED ORDER — ZOLPIDEM TARTRATE 10 MG PO TABS
10.0000 mg | ORAL_TABLET | Freq: Every evening | ORAL | Status: DC | PRN
  Administered 2014-03-05: 10 mg via ORAL
  Filled 2014-03-05: qty 1

## 2014-03-05 MED ORDER — OXYCODONE-ACETAMINOPHEN 10-325 MG PO TABS: 1.0000 | ORAL_TABLET | Freq: Three times a day (TID) | ORAL | Status: DC | PRN

## 2014-03-05 MED ORDER — FENTANYL 50 MCG/HR TD PT72
50.0000 ug | MEDICATED_PATCH | TRANSDERMAL | Status: DC
  Administered 2014-03-05: 50 ug via TRANSDERMAL
  Filled 2014-03-05: qty 1
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
## 2014-03-05 NOTE — BH Assessment (Addendum)
Inpt recommended. No [facility] beds currently available. TTS to seek placement. Sent referrals to: [facility]  [facility]  [place]- declined due to acuity   Clista Bernhardt, [facility] Triage Specialist 03/05/2014 3:45 AM

## 2014-03-05 NOTE — ED Notes (Signed)
Patient came to desk requesting fentaNYL patch. Patient was informed by writer that her patch was not due to 8:00 am but I would call EDP to see if something else could be order. Before writer could call EDP patient threw her bedside table, bang on windows and urinated on floor. Dr. Read Drivers contacted and came to patient bedside. New orders received and read back. Encouragement and support provided and safety maintain.

## 2014-03-05 NOTE — Telephone Encounter (Signed)
Left message on pt Vm that letter is ready for pick up.

## 2014-03-05 NOTE — BH Assessment (Addendum)
[facility] Assessment Progress Note  Pt has been accepted to [facility] by Thedore Mins, MD, Rm 303-1. This was discussed with pt. She reports that she works at [employer].  Pt was asked if she wanted [place] to pursue admission to another facility, but she declined.  She has signed Consent to Release Information, but only to refuse release of information to anyone, including her current outpatient provider. This along with IVC papers was faxed to [facility]. Pt's nurse, has been notified. She agrees to send original paperwork to [facility] along with pt via [employer], and to call report to [phone].  Doylene Canning, MA Triage Specialist 03/05/2014 @ 14:46

## 2014-03-06 ENCOUNTER — Encounter (HOSPITAL_COMMUNITY): Payer: Self-pay | Admitting: *Deleted

## 2014-03-06 ENCOUNTER — Inpatient Hospital Stay (HOSPITAL_COMMUNITY)
Admission: AD | Admit: 2014-03-06 | Discharge: 2014-03-07 | DRG: 885 | Disposition: A | Discharge status: Home / Self Care | Payer: 59 | Source: Intra-hospital | Attending: Psychiatry | Admitting: Psychiatry

## 2014-03-06 DIAGNOSIS — F911 Conduct disorder, childhood-onset type: Secondary | ICD-10-CM | POA: Diagnosis not present

## 2014-03-06 DIAGNOSIS — F329 Major depressive disorder, single episode, unspecified: Secondary | ICD-10-CM | POA: Diagnosis present

## 2014-03-06 DIAGNOSIS — R45851 Suicidal ideations: Secondary | ICD-10-CM | POA: Diagnosis present

## 2014-03-06 DIAGNOSIS — M797 Fibromyalgia: Secondary | ICD-10-CM | POA: Diagnosis present

## 2014-03-06 DIAGNOSIS — F4310 Post-traumatic stress disorder, unspecified: Secondary | ICD-10-CM | POA: Diagnosis present

## 2014-03-06 DIAGNOSIS — F332 Major depressive disorder, recurrent severe without psychotic features: Principal | ICD-10-CM | POA: Diagnosis present

## 2014-03-06 DIAGNOSIS — Z79899 Other long term (current) drug therapy: Secondary | ICD-10-CM | POA: Diagnosis not present

## 2014-03-06 DIAGNOSIS — Z87891 Personal history of nicotine dependence: Secondary | ICD-10-CM | POA: Diagnosis not present

## 2014-03-06 DIAGNOSIS — R451 Restlessness and agitation: Secondary | ICD-10-CM | POA: Diagnosis present

## 2014-03-06 HISTORY — DX: Anxiety disorder, unspecified: F41.9

## 2014-03-06 MED ORDER — DULOXETINE HCL 20 MG PO CPEP
20.0000 mg | ORAL_CAPSULE | Freq: Every day | ORAL | Status: DC
  Administered 2014-03-07: 20 mg via ORAL
  Filled 2014-03-06 (×4): qty 1

## 2014-03-06 MED ORDER — ACETAMINOPHEN 325 MG PO TABS
650.0000 mg | ORAL_TABLET | Freq: Four times a day (QID) | ORAL | Status: DC | PRN
Start: 2014-03-06 — End: 2014-03-07

## 2014-03-06 MED ORDER — QUETIAPINE FUMARATE 50 MG PO TABS
50.0000 mg | ORAL_TABLET | Freq: Every day | ORAL | Status: DC
  Administered 2014-03-06: 50 mg via ORAL
  Filled 2014-03-06 (×4): qty 1

## 2014-03-06 MED ORDER — CLONAZEPAM 0.5 MG PO TABS: 0.5000 mg | ORAL_TABLET | Freq: Two times a day (BID) | ORAL | Status: DC | PRN

## 2014-03-06 MED ORDER — ALUM & MAG HYDROXIDE-SIMETH 200-200-20 MG/5ML PO SUSP: 30.0000 mL | ORAL | Status: DC | PRN

## 2014-03-06 MED ORDER — QUETIAPINE FUMARATE 25 MG PO TABS
25.0000 mg | ORAL_TABLET | Freq: Every day | ORAL | Status: DC
  Administered 2014-03-06: 25 mg via ORAL
  Filled 2014-03-06: qty 1

## 2014-03-06 MED ORDER — QUETIAPINE FUMARATE 50 MG PO TABS: 50.0000 mg | ORAL_TABLET | Freq: Every day | ORAL | Status: DC

## 2014-03-06 MED ORDER — CLONAZEPAM 0.5 MG PO TABS
0.5000 mg | ORAL_TABLET | Freq: Two times a day (BID) | ORAL | Status: DC | PRN
  Administered 2014-03-06 – 2014-03-07 (×4): 0.5 mg via ORAL
  Filled 2014-03-06 (×4): qty 1

## 2014-03-06 MED ORDER — DULOXETINE HCL 20 MG PO CPEP
20.0000 mg | ORAL_CAPSULE | Freq: Every day | ORAL | Status: DC
  Administered 2014-03-06: 20 mg via ORAL
  Filled 2014-03-06 (×2): qty 1

## 2014-03-06 MED ORDER — TRAZODONE HCL 100 MG PO TABS: 100.0000 mg | ORAL_TABLET | Freq: Every day | ORAL | Status: DC

## 2014-03-06 MED ORDER — ACETAMINOPHEN 500 MG PO TABS: 1000.0000 mg | ORAL_TABLET | ORAL | Status: DC | PRN

## 2014-03-06 MED ORDER — QUETIAPINE FUMARATE 25 MG PO TABS
25.0000 mg | ORAL_TABLET | Freq: Every day | ORAL | Status: DC
  Administered 2014-03-07: 25 mg via ORAL
  Filled 2014-03-06 (×4): qty 1

## 2014-03-06 MED ORDER — MAGNESIUM HYDROXIDE 400 MG/5ML PO SUSP: 30.0000 mL | Freq: Every day | ORAL | Status: DC | PRN

## 2014-03-06 NOTE — ED Notes (Signed)
Patient left the unit ambulatory with [employer].  She was cooperative with the discharge process.

## 2014-03-06 NOTE — Tx Team (Signed)
Initial Interdisciplinary Treatment Plan   PATIENT STRESSORS: Marital or family conflict   PATIENT STRENGTHS: Ability for insight Active sense of humor Average or above average intelligence Capable of independent living Metallurgist fund of knowledge Motivation for treatment/growth Physical Health Supportive family/friends Work skills   PROBLEM LIST: Problem List/Patient Goals Date to be addressed Date deferred Reason deferred Estimated date of resolution  "suicidal ideation:" 03/06/2014   D/c   "depression" 03/06/2014   D/c        "anxiety" 03/06/2014   D/c                                 DISCHARGE CRITERIA:  Ability to meet basic life and health needs Improved stabilization in mood, thinking, and/or behavior Motivation to continue treatment in a less acute level of care Need for constant or close observation no longer present Reduction of life-threatening or endangering symptoms to within safe limits Safe-care adequate arrangements made Verbal commitment to aftercare and medication compliance  PRELIMINARY DISCHARGE PLAN: Attend aftercare/continuing care group Attend PHP/IOP Outpatient therapy Participate in family therapy Return to previous living arrangement Return to previous work or school arrangements  PATIENT/FAMIILY INVOLVEMENT: This treatment plan has been presented to and reviewed with the patient, Kara [name], who is looking for treatment of anxiety:s.  The patient and family have been given the opportunity to ask questions and make suggestions.  [name] 03/06/2014, 6:26 PM
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
## 2014-03-06 NOTE — Progress Notes (Signed)
Did not attend group 

## 2014-03-06 NOTE — Progress Notes (Signed)
CSW met with pt and pt spouse at bedside as requested by pt. Pt gave permission for CSW to speak with pt and pt spouse together. Pt signed consent to release information to patient husband Tylisa Alcivar At this time pt and pt spouse are concerned regarding patient waiting time in the ED before going to [facility]. Patient also expressed that she would prefer to go to [place] over another hospital as she is a patient of Dr. Iona Coach. CSW, Pt, and Pt spouse discussed that patient has been accepted to [place] pending bed availabilty. CSW explained that the goal is to get patient treatment as soon as possible and in a therapeutic setting. Pt is very distrought and begging for [employer] health. Patient states that she understands the process and in agreement with plan. Pt is hopeful she will be able to go to [facility]. Patient verbalizes understanding of being under involuntary commitment. Pt spouse also verbalized understanding. Pt and CSW thanked csw for concern and support.   Noreene Larsson [phone]  ED CSW 03/06/2014 11:01am
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
## 2014-03-06 NOTE — BH Assessment (Signed)
[facility] Assessment Progress Note  Pt has been assigned to Rm 505-1 by Thurman Coyer, RN, AC.  Pt's nurse, Dawnaly, has been notified.  Doylene Canning, MA Triage Specialist 03/06/2014 @ 14:05

## 2014-03-06 NOTE — ED Notes (Signed)
Report called to [place] at [facility].  Patient was loud, tearful, and unable to be consoled this morning.  Requested Xanax and oxycodone.  Did finally calm down some after her husband came in and spoke with her.  She initially did not want to go to the hospital stating she did not mean all those things she said yesterday.  She then agreed to go to [facility].  She also called over there twice, once to assessment and once to the [facility].  Patient was advised that this was not acceptable and she would not be allowed to use the phone any longer.  She was allowed to use the phone after a while, but staff dialed the number for her.  [employer] called to transport patient over to [facility]
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
## 2014-03-06 NOTE — ED Notes (Signed)
Patient has been calm and cooperative during the shift. Patient also has remorse for how she has been acting. Patient tearful while talking to Clinical research associate and states that she ready to receive treatment. Encouragement and support provided and safety maintain.
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
## 2014-03-07 DIAGNOSIS — F332 Major depressive disorder, recurrent severe without psychotic features: Principal | ICD-10-CM

## 2014-03-07 DIAGNOSIS — F4310 Post-traumatic stress disorder, unspecified: Secondary | ICD-10-CM

## 2014-03-07 MED ORDER — CEPHALEXIN 500 MG PO CAPS: 500.0000 mg | ORAL_CAPSULE | Freq: Three times a day (TID) | ORAL | Status: DC

## 2014-03-07 MED ORDER — DULOXETINE HCL 20 MG PO CPEP: 20.0000 mg | ORAL_CAPSULE | Freq: Every day | ORAL | Status: DC

## 2014-03-07 MED ORDER — CLONAZEPAM 0.5 MG PO TABS: 0.5000 mg | ORAL_TABLET | Freq: Two times a day (BID) | ORAL | Status: DC | PRN

## 2014-03-07 MED ORDER — QUETIAPINE FUMARATE 25 MG PO TABS: 25.0000 mg | ORAL_TABLET | Freq: Every day | ORAL | Status: DC

## 2014-03-07 MED ORDER — QUETIAPINE FUMARATE 50 MG PO TABS: 50.0000 mg | ORAL_TABLET | Freq: Every day | ORAL | Status: DC

## 2014-03-07 MED ORDER — LORAZEPAM 2 MG/ML IJ SOLN
INTRAMUSCULAR | Status: AC
  Filled 2014-03-07: qty 1

## 2014-03-07 MED ORDER — MELOXICAM 15 MG PO TABS: 15.0000 mg | ORAL_TABLET | Freq: Every day | ORAL | Status: DC

## 2014-03-07 MED ORDER — ONDANSETRON 8 MG PO TBDP: 8.0000 mg | ORAL_TABLET | Freq: Three times a day (TID) | ORAL | Status: DC | PRN

## 2014-03-07 NOTE — Progress Notes (Signed)
Patient ID: Kara [name], female   DOB: July 05, 1955, 58 y.o.   MRN: [id number] Attempted to contact spouse with no success. Per Dr. Dub Mikes, if husband is agreeable and feels patient is not a danger to herself or others, patient may be discharged today.
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
## 2014-03-07 NOTE — Discharge Summary (Deleted)
Physician Discharge Summary Note  Patient:  Kara [name] is an 58 y.o., female MRN:  [id number] DOB:  November 10, 1955 Patient phone:  [phone] (home)  Patient address:   [address] [place] [address] [place] [address],  Total Time spent with patient: 30 minutes  Date of Admission:  03/06/2014 Date of Discharge: 03/07/2014  Reason for Admission:  Psychosis Discharge Diagnoses:  Major Depression recurrent severe, PTSD   Active Problems:   Severe recurrent major depression without psychotic features   PTSD (post-traumatic stress disorder)  Psychiatric Specialty Exam: Physical Exam  Vitals reviewed. Eyes: Left eye exhibits no discharge.  Psychiatric: Her behavior is normal. Judgment and thought content normal. Her mood appears anxious. Cognition and memory are normal.    Review of Systems  Constitutional: Negative.   HENT: Negative.   Eyes: Negative.   Respiratory: Negative.   Cardiovascular: Negative.   Gastrointestinal: Negative.   Genitourinary: Negative.   Musculoskeletal: Negative.   Skin: Negative.   Neurological: Negative.   Endo/Heme/Allergies: Negative.   Psychiatric/Behavioral: Negative for depression, suicidal ideas, hallucinations, memory loss and substance abuse. The patient is nervous/anxious. The patient does not have insomnia.     Blood pressure 105/79, pulse 84, temperature 98.1 F (36.7 C), temperature source Oral, resp. rate 12, height '5\' 3"'  (1.6 m), weight 63.504 kg (140 lb), SpO2 99 %.Body mass index is 24.81 kg/(m^2).   Musculoskeletal: Strength & Muscle Tone: within normal limits Gait & Station: normal Patient leans: N/A  Past Psychiatric History: Diagnosis: psychosis  Hospitalizations: [facility]  Outpatient Care: [facility]  Substance Abuse Care:   Self-Mutilation: denies  Suicidal Attempts: History of  Violent Behaviors: History of   Axis Diagnosis:  AXIS I: Major Depression recurrent severe, PTSD AXIS II: No diagnosis AXIS III:  Past Medical  History  Diagnosis Date  . Fibromyalgia   . 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 Arthritis   . Rheumatoid arthritis(714.0)     autoimmune arthritis; methotrexate once/week  . Depression     takes meds daily  . Post-nasal drip     hx of  . Other specified rheumatoid arthritis, right shoulder 08/01/2011  . Asthma   . Allergy   . Chronic kidney disease   . Urinary incontinence   . Family history of malignant neoplasm of breast   . Chronic pain   . Cellulitis of breast 11/2013    RIGHT BREAST  . Anxiety    AXIS IV: other psychosocial or environmental problems AXIS V: 61-70 mild symptoms  Level of Care:  OP  Hospital Course:             1.  Take all your medications as prescribed.              2.  Report any adverse side effects to outpatient provider.                       3.  Patient instructed to not use alcohol or illegal drugs while on prescription medicines.            4.  In the event of worsening symptoms, instructed patient to call 911, the crisis hotline or go to nearest emergency room for evaluation of symptoms.  Consults:  psychiatry  Significant Diagnostic Studies:  labs: per   Discharge Vitals:   Blood pressure 105/79, pulse 84, temperature 98.1 F (36.7 C), temperature source Oral, resp. rate 12, height '5\' 3"'  (1.6 m),  weight 63.504 kg (140 lb), SpO2 99 %. Body mass index is 24.81 kg/(m^2). Lab Results:   Results for orders placed or performed during the hospital encounter of 03/04/14 (from the past 72 hour(s))  Acetaminophen level     Status: None   Collection Time: 03/04/14  8:04 PM  Result Value Ref Range   Acetaminophen (Tylenol), Serum <15.0 10 - 30 ug/mL    Comment:        THERAPEUTIC CONCENTRATIONS VARY SIGNIFICANTLY. A RANGE OF 10-30 ug/mL MAY BE AN EFFECTIVE CONCENTRATION FOR MANY PATIENTS. HOWEVER, SOME ARE BEST TREATED AT CONCENTRATIONS OUTSIDE  THIS RANGE. ACETAMINOPHEN CONCENTRATIONS >150 ug/mL AT 4 HOURS AFTER INGESTION AND >50 ug/mL AT 12 HOURS AFTER INGESTION ARE OFTEN ASSOCIATED WITH TOXIC REACTIONS.   CBC     Status: None   Collection Time: 03/04/14  8:04 PM  Result Value Ref Range   WBC 8.6 4.0 - 10.5 K/uL   RBC 4.90 3.87 - 5.11 MIL/uL   Hemoglobin 13.0 12.0 - 15.0 g/dL   HCT 41.6 36.0 - 46.0 %   MCV 84.9 78.0 - 100.0 fL   MCH 26.5 26.0 - 34.0 pg   MCHC 31.3 30.0 - 36.0 g/dL   RDW 14.3 11.5 - 15.5 %   Platelets 283 150 - 400 K/uL  Comprehensive metabolic panel     Status: Abnormal   Collection Time: 03/04/14  8:04 PM  Result Value Ref Range   Sodium 139 137 - 147 mEq/L   Potassium 3.9 3.7 - 5.3 mEq/L   Chloride 100 96 - 112 mEq/L   CO2 22 19 - 32 mEq/L   Glucose, Bld 90 70 - 99 mg/dL   BUN 24 (H) 6 - 23 mg/dL   Creatinine, Ser 0.69 0.50 - 1.10 mg/dL   Calcium 9.7 8.4 - 10.5 mg/dL   Total Protein 8.2 6.0 - 8.3 g/dL   Albumin 3.8 3.5 - 5.2 g/dL   AST 17 0 - 37 U/L   ALT 17 0 - 35 U/L   Alkaline Phosphatase 148 (H) 39 - 117 U/L   Total Bilirubin <0.2 (L) 0.3 - 1.2 mg/dL   GFR calc non Af Amer >90 >90 mL/min   GFR calc Af Amer >90 >90 mL/min    Comment: (NOTE) The eGFR has been calculated using the CKD EPI equation. This calculation has not been validated in all clinical situations. eGFR's persistently <90 mL/min signify possible Chronic Kidney Disease.    Anion gap 17 (H) 5 - 15  Ethanol (ETOH)     Status: Abnormal   Collection Time: 03/04/14  8:04 PM  Result Value Ref Range   Alcohol, Ethyl (B) 54 (H) 0 - 11 mg/dL    Comment:        LOWEST DETECTABLE LIMIT FOR SERUM ALCOHOL IS 11 mg/dL FOR MEDICAL PURPOSES ONLY   Salicylate level     Status: Abnormal   Collection Time: 03/04/14  8:04 PM  Result Value Ref Range   Salicylate Lvl <2.9 (L) 2.8 - 20.0 mg/dL  Urine Drug Screen     Status: Abnormal   Collection Time: 03/04/14  8:13 PM  Result Value Ref Range   Opiates NONE DETECTED NONE DETECTED    Cocaine NONE DETECTED NONE DETECTED   Benzodiazepines POSITIVE (A) NONE DETECTED   Amphetamines POSITIVE (A) NONE DETECTED   Tetrahydrocannabinol NONE DETECTED NONE DETECTED   Barbiturates NONE DETECTED NONE DETECTED    Comment:        DRUG SCREEN FOR MEDICAL  PURPOSES ONLY.  IF CONFIRMATION IS NEEDED FOR ANY PURPOSE, NOTIFY LAB WITHIN 5 DAYS.        LOWEST DETECTABLE LIMITS FOR URINE DRUG SCREEN Drug Class       Cutoff (ng/mL) Amphetamine      1000 Barbiturate      200 Benzodiazepine   704 Tricyclics       888 Opiates          300 Cocaine          300 THC              50     Physical Findings: AIMS: Facial and Oral Movements Muscles of Facial Expression: None, normal Lips and Perioral Area: None, normal Jaw: None, normal Tongue: None, normal,Extremity Movements Upper (arms, wrists, hands, fingers): None, normal Lower (legs, knees, ankles, toes): None, normal, Trunk Movements Neck, shoulders, hips: None, normal, Overall Severity Severity of abnormal movements (highest score from questions above): None, normal Incapacitation due to abnormal movements: None, normal Patient's awareness of abnormal movements (rate only patient's report): No Awareness, Dental Status Current problems with teeth and/or dentures?: No Does patient usually wear dentures?: No  CIWA:  CIWA-Ar Total: 4 COWS:  COWS Total Score: 5  Psychiatric Specialty Exam: See Psychiatric Specialty Exam and Suicide Risk Assessment completed by Attending Physician prior to discharge.  Discharge destination:  Home  Is patient on multiple antipsychotic therapies at discharge:  No   Has Patient had three or more failed trials of antipsychotic monotherapy by history:  No  Recommended Plan for Multiple Antipsychotic Therapies: NA    Medication List    STOP taking these medications        ALPRAZolam 0.5 MG tablet  Commonly known as:  XANAX     fentaNYL 50 MCG/HR  Commonly known as:  DURAGESIC - dosed  mcg/hr      TAKE these medications      Indication   cephALEXin 500 MG capsule  Commonly known as:  KEFLEX  Take 1 capsule (500 mg total) by mouth 3 (three) times daily.      clonazePAM 0.5 MG tablet  Commonly known as:  KLONOPIN  Take 1 tablet (0.5 mg total) by mouth 2 (two) times daily as needed (anxiety).   Indication:  agitation, anxiety     DULoxetine 20 MG capsule  Commonly known as:  CYMBALTA  Take 1 capsule (20 mg total) by mouth daily.   Indication:  Fibromyalgia Syndrome, Major Depressive Disorder, Musculoskeletal Pain     meloxicam 15 MG tablet  Commonly known as:  MOBIC  Take 1 tablet (15 mg total) by mouth daily.      ondansetron 8 MG disintegrating tablet  Commonly known as:  ZOFRAN ODT  Take 1 tablet (8 mg total) by mouth every 8 (eight) hours as needed for nausea or vomiting.      ondansetron 8 MG disintegrating tablet  Commonly known as:  ZOFRAN ODT  Take 1 tablet (8 mg total) by mouth every 8 (eight) hours as needed for nausea or vomiting.      QUEtiapine 25 MG tablet  Commonly known as:  SEROQUEL  Take 1 tablet (25 mg total) by mouth daily.   Indication:  mood stability     QUEtiapine 50 MG tablet  Commonly known as:  SEROQUEL  Take 1 tablet (50 mg total) by mouth at bedtime.   Indication:  Trouble Sleeping, mood stability        Follow-up recommendations:  Activity:  as tol, diet as tol  Comments:  1.  Take all your medications as prescribed.              2.  Report any adverse side effects to outpatient provider.                       3.  Patient instructed to not use alcohol or illegal drugs while on prescription medicines.            4.  In the event of worsening symptoms, instructed patient to call 911, the crisis hotline or go to nearest emergency room for evaluation of symptoms.  Total Discharge Time:  Greater than 30 minutes.  SignedKerrie Buffalo MAY, AGNP-BC 03/07/2014, 2:14 PM

## 2014-03-07 NOTE — Discharge Summary (Signed)
Physician Discharge Summary Note  Patient:  Kara [name] is an 58 y.o., female MRN:  [id number] DOB:  05-14-1955 Patient phone:  [phone] (home)  Patient address:   [address] [place] [address],  Total Time spent with patient: 30 minutes  Date of Admission:  03/06/2014 Date of Discharge: 03/07/2014  Reason for Admission:  Depression, PTSD  Discharge Diagnoses:  Major Depression recurrent severe, PTSD Active Problems:   Severe recurrent major depression without psychotic features   PTSD (post-traumatic stress disorder)   Psychiatric Specialty Exam: Physical Exam  Vitals reviewed. Psychiatric: She has a normal mood and affect. Her behavior is normal. Judgment and thought content normal.    Review of Systems  Constitutional: Negative.   HENT: Negative.   Eyes: Negative.   Respiratory: Negative.   Cardiovascular: Negative.   Gastrointestinal: Negative.   Genitourinary: Negative.   Musculoskeletal: Negative.   Skin: Negative.   Neurological: Negative.   Endo/Heme/Allergies: Negative.   Psychiatric/Behavioral: Positive for depression (Hx of, chronic, stabilized). Negative for suicidal ideas, hallucinations, memory loss and substance abuse. The patient is nervous/anxious. The patient does not have insomnia.     Blood pressure 105/79, pulse 84, temperature 98.1 F (36.7 C), temperature source Oral, resp. rate 12, height 5\' 3"  (1.6 m), weight 63.504 kg (140 lb), SpO2 99 %.Body mass index is 24.81 kg/(m^2).   Musculoskeletal: Strength & Muscle Tone: within normal limits Gait & Station: normal Patient leans: N/A  Past Psychiatric History: Diagnosis: psychosis  Hospitalizations: [facility]  Outpatient Care: Dr [name]  Substance Abuse Care:   Self-Mutilation: denies  Suicidal Attempts: History of  Violent Behaviors: History of   Axis Diagnosis:  AXIS I: Major Depression recurrent severe, PTSD AXIS II: No diagnosis AXIS III:  Past Medical History   Diagnosis Date  . Fibromyalgia   . 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 Arthritis   . Rheumatoid arthritis(714.0)     autoimmune arthritis; methotrexate once/week  . Depression     takes meds daily  . Post-nasal drip     hx of  . Other specified rheumatoid arthritis, right shoulder 08/01/2011  . Asthma   . Allergy   . Chronic kidney disease   . Urinary incontinence   . Family history of malignant neoplasm of breast   . Chronic pain   . Cellulitis of breast 11/2013    RIGHT BREAST  . Anxiety    AXIS IV: other psychosocial or environmental problems AXIS V: 61-70 mild symptoms  Level of Care:  OP  Hospital Course:  Kara [name] is a 58 y.o. female who presents to the [facility] with comments of wanting to hurt herself. She states that she was going to get a gun and shoot self in the head. PMH includes fibromyalgia. She reports a special needs daughter and other family issues that contribute to life stressors. BAC was 54. Positive UDS for benzos and amphetamines.   She was admitted yesterday for crisis management. Today, she is denying having SI plans or intent. In full contact with reality. States she has no SI plans or intent. States that when the police came to her house after they were called by her daughter. Her PTSD was triggered. States that apart from the conflict with her daughter she has been doing well. She is not talkative and responses to questions are simple and short. States that, "police came, I became upset, that's it."  Today, patient fully aware what triggered her  crisis.  Discussed with patient and her husband the plan.  Patient to remain adherent to medication.  Will make follow up appt to see outpatient therapist and psychiatrist.  Consults:  psychiatry  Significant Diagnostic Studies:  labs: per ED  Discharge Vitals:   Blood pressure 105/79, pulse 84,  temperature 98.1 F (36.7 C), temperature source Oral, resp. rate 12, height 5\' 3"  (1.6 m), weight 63.504 kg (140 lb), SpO2 99 %. Body mass index is 24.81 kg/(m^2). Lab Results:   No results found for this or any previous visit (from the past 72 hour(s)).  Physical Findings: AIMS: Facial and Oral Movements Muscles of Facial Expression: None, normal Lips and Perioral Area: None, normal Jaw: None, normal Tongue: None, normal,Extremity Movements Upper (arms, wrists, hands, fingers): None, normal Lower (legs, knees, ankles, toes): None, normal, Trunk Movements Neck, shoulders, hips: None, normal, Overall Severity Severity of abnormal movements (highest score from questions above): None, normal Incapacitation due to abnormal movements: None, normal Patient's awareness of abnormal movements (rate only patient's report): No Awareness, Dental Status Current problems with teeth and/or dentures?: No Does patient usually wear dentures?: No  CIWA:  CIWA-Ar Total: 4 COWS:  COWS Total Score: 5  Psychiatric Specialty Exam: See Psychiatric Specialty Exam and Suicide Risk Assessment completed by Attending Physician prior to discharge.  Discharge destination:  Home  Is patient on multiple antipsychotic therapies at discharge:  No   Has Patient had three or more failed trials of antipsychotic monotherapy by history:  No  Recommended Plan for Multiple Antipsychotic Therapies: NA    Medication List    STOP taking these medications        ALPRAZolam 0.5 MG tablet  Commonly known as:  XANAX     fentaNYL 50 MCG/HR  Commonly known as:  DURAGESIC - dosed mcg/hr      TAKE these medications      Indication   cephALEXin 500 MG capsule  Commonly known as:  KEFLEX  Take 1 capsule (500 mg total) by mouth 3 (three) times daily.   Indication:  infection     clonazePAM 0.5 MG tablet  Commonly known as:  KLONOPIN  Take 1 tablet (0.5 mg total) by mouth 2 (two) times daily as needed (anxiety).    Indication:  agitation, anxiety     DULoxetine 20 MG capsule  Commonly known as:  CYMBALTA  Take 1 capsule (20 mg total) by mouth daily.   Indication:  Fibromyalgia Syndrome, Major Depressive Disorder, Musculoskeletal Pain     meloxicam 15 MG tablet  Commonly known as:  MOBIC  Take 1 tablet (15 mg total) by mouth daily.   Indication:  Joint Damage causing Pain and Loss of Function     ondansetron 8 MG disintegrating tablet  Commonly known as:  ZOFRAN ODT  Take 1 tablet (8 mg total) by mouth every 8 (eight) hours as needed for nausea or vomiting.   Indication:  Nausea vomiting     ondansetron 8 MG disintegrating tablet  Commonly known as:  ZOFRAN ODT  Take 1 tablet (8 mg total) by mouth every 8 (eight) hours as needed for nausea or vomiting.   Indication:  nausea vomiting     QUEtiapine 50 MG tablet  Commonly known as:  SEROQUEL  Take 1 tablet (50 mg total) by mouth at bedtime.   Indication:  Trouble Sleeping, mood stability     QUEtiapine 25 MG tablet  Commonly known as:  SEROQUEL  Take 1 tablet (25 mg total) by  mouth daily.   Indication:  mood stability        Follow-up recommendations:  Activity:  as tol, diet as tol  Comments:  1.  Take all your medications as prescribed.              2.  Report any adverse side effects to outpatient provider.                       3.  Patient instructed to not use alcohol or illegal drugs while on prescription medicines.            4.  In the event of worsening symptoms, instructed patient to call 911, the crisis hotline or go to nearest emergency room for evaluation of symptoms.1.  Take all your medications as prescribed.    Total Discharge Time:  Greater than 30 minutes.  SignedAdonis Brook MAY, AGNP-BC 03/09/2014, 9:06 AM  I personally assessed the patient and formulated the plan Madie Reno A. Dub Mikes, M.D.

## 2014-03-07 NOTE — [facility] Group Notes (Signed)
[facility] Group Notes:  Healthy coping skills Date:  03/07/2014  Time:  10:16 AM  Type of Therapy:  Nurse Education  Participation Level:  Active  Participation Quality:  Appropriate  Affect:  Appropriate  Cognitive:  Appropriate  Insight:  Appropriate  Engagement in Group:  Engaged  Modes of Intervention:  Discussion  Summary of Progress/Problems:  Kara [name] 03/07/2014, 10:16 AM

## 2014-03-07 NOTE — Progress Notes (Signed)
Patient ID: Kara [name], female   DOB: April 27, 1955, 58 y.o.   MRN: [id number] Nursing discharge note:  Patient discharged home per MD order.  Patient will follow up with her regular psychiatrist, Dr. [name] for medication management.  Patient's spouse was contacted and he was agreeable with patient returning home.  He states he does not feel patient is a danger to herself or anyone else.  She denies SI/HI/AVH.  Prescriptions and discharge instructions reviewed.  Patient received all personal belongings.  She left ambulatory with her husband.
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
## 2014-03-13 NOTE — Progress Notes (Signed)
Patient Discharge Instructions:  No documentation was faxed for [facility]IPS.  No follow up provider on the AVS/no ROI available.  Jerelene Redden, 03/13/2014, 2:13 PM

## 2014-03-24 ENCOUNTER — Encounter (HOSPITAL_COMMUNITY): Payer: Self-pay | Admitting: Emergency Medicine

## 2014-03-24 ENCOUNTER — Emergency Department (HOSPITAL_COMMUNITY)
Admission: EM | Admit: 2014-03-24 | Discharge: 2014-03-24 | Disposition: A | Discharge status: Home / Self Care | Payer: Managed Care, Other (non HMO) | Attending: Emergency Medicine | Admitting: Emergency Medicine

## 2014-03-24 DIAGNOSIS — M79605 Pain in left leg: Secondary | ICD-10-CM | POA: Diagnosis not present

## 2014-03-24 DIAGNOSIS — J45909 Unspecified asthma, uncomplicated: Secondary | ICD-10-CM | POA: Insufficient documentation

## 2014-03-24 DIAGNOSIS — Z8739 Personal history of other diseases of the musculoskeletal system and connective tissue: Secondary | ICD-10-CM | POA: Insufficient documentation

## 2014-03-24 DIAGNOSIS — M5432 Sciatica, left side: Secondary | ICD-10-CM

## 2014-03-24 DIAGNOSIS — M549 Dorsalgia, unspecified: Secondary | ICD-10-CM | POA: Diagnosis present

## 2014-03-24 DIAGNOSIS — F419 Anxiety disorder, unspecified: Secondary | ICD-10-CM | POA: Diagnosis not present

## 2014-03-24 DIAGNOSIS — M797 Fibromyalgia: Secondary | ICD-10-CM | POA: Insufficient documentation

## 2014-03-24 DIAGNOSIS — G8929 Other chronic pain: Secondary | ICD-10-CM | POA: Diagnosis not present

## 2014-03-24 DIAGNOSIS — N189 Chronic kidney disease, unspecified: Secondary | ICD-10-CM | POA: Diagnosis not present

## 2014-03-24 DIAGNOSIS — Z791 Long term (current) use of non-steroidal anti-inflammatories (NSAID): Secondary | ICD-10-CM | POA: Diagnosis not present

## 2014-03-24 DIAGNOSIS — Z792 Long term (current) use of antibiotics: Secondary | ICD-10-CM | POA: Diagnosis not present

## 2014-03-24 DIAGNOSIS — Z79899 Other long term (current) drug therapy: Secondary | ICD-10-CM | POA: Diagnosis not present

## 2014-03-24 DIAGNOSIS — F329 Major depressive disorder, single episode, unspecified: Secondary | ICD-10-CM | POA: Insufficient documentation

## 2014-03-24 DIAGNOSIS — Z87891 Personal history of nicotine dependence: Secondary | ICD-10-CM | POA: Insufficient documentation

## 2014-03-24 MED ORDER — HYDROMORPHONE HCL 1 MG/ML IJ SOLN
2.0000 mg | Freq: Once | INTRAMUSCULAR | Status: AC
  Administered 2014-03-24: 2 mg via INTRAMUSCULAR
  Filled 2014-03-24: qty 2

## 2014-03-24 NOTE — Discharge Instructions (Signed)
Read the information below.  You may return to the Emergency Department at any time for worsening condition or any new symptoms that concern you.    If you develop fevers, loss of control of bowel or bladder, weakness or numbness in your legs, or are unable to walk, return to the ER for a recheck.  °

## 2014-03-24 NOTE — ED Provider Notes (Signed)
CSN: [id number]     Arrival date & time 03/24/14  1750 History  This chart was scribed for non-physician practitioner, Trixie Dredge, PA-C, working with Loren Racer, MD, by Bronson Curb, ED Scribe. This patient was seen in room TR07C/TR07C and the patient's care was started at 7:35 PM.     Chief Complaint  Patient presents with  . Back Pain  . Leg Pain    The history is provided by the patient. No language interpreter was used.     HPI Comments: Kara [name] is a 58 y.o. female, with history of fibromyalgia, rheumatoid arthritis, anxiety, and depression, who presents to the Emergency Department complaining of intermittent, throbbing, lower back pain that radiates down her entire left leg pain for the past 2 days. Patient reports history of sciatica and states this feels the same. Patient is established with a PCP and orthopedist (Dr. Maurice Small at [facility]), however, she reports she was unable to get an appointment over the holidays. There is associated numbness/weakness throughout her left leg. States this is exactly how it feels every time it "acts up."  She also notes fever but states this is related to her rheumatoid arthritis. She reports the pain is "unbearable" and makes it difficult to ambulate. Patient reports she typically receives injections as treatment for her pain. She reports that she is in the process of switching providers, however, she state she will not be seen until next month. Patient states she takes Percocet and Dilaudid at home without significant improvement. She denies bowel/bladder incontinence, abdominal pain, nausea, vomiting, or diarrhea.   Notes she is in pain management and does not want any prescriptions.     Past Medical History  Diagnosis Date  . Fibromyalgia   . 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 Arthritis   . Rheumatoid arthritis(714.0)     autoimmune arthritis; methotrexate once/week  . Depression     takes meds  daily  . Post-nasal drip     hx of  . Other specified rheumatoid arthritis, right shoulder 08/01/2011  . Asthma   . Allergy   . Chronic kidney disease   . Urinary incontinence   . Family history of malignant neoplasm of breast   . Chronic pain   . Cellulitis of breast 11/2013    RIGHT BREAST  . Anxiety    Past Surgical History  Procedure Laterality Date  . Shoulder surgery  2012    right  . Back surgery  2011    lower back fusion l4-5, s1  . Bladder surgery  2009  . Tubal ligation  1988  . Breast enhancement surgery  2003  . Liposuction  2003    abdominal  . Tonsillectomy  1987  . Total shoulder arthroplasty  08/01/2011    Procedure: TOTAL SHOULDER ARTHROPLASTY;  Surgeon: Eulas Post, MD;  Location: [facility] OR;  Service: Orthopedics;  Laterality: Right;  Right total shoulder arthroplasty  . Mass excision  11/03/2011    Procedure: MINOR EXCISION OF MASS;  Surgeon: Wyn Forster., MD;  Location: [facility];  Service: Orthopedics;  Laterality: Left;  debride IP joint, cyst excision left index  . Breast surgery  10/2013    REMOVAL OF BREAST IMPLANTS  . Breast implants removed    . Incision and drainage abscess Right 01/16/2014    Procedure: INCISION AND DRAINAGE AND OF RIGHT BREAST ABCESS;  Surgeon: Glenna Fellows, MD;  Location: [facility];  Service: General;  Laterality: Right;   Family History  Problem Relation Age of Onset  . Arthritis Mother   . Heart disease Mother     ?psvt  . Breast cancer Mother 34    TAH/BSO  . COPD Father   . Hypertension Father   . Breast cancer Maternal Aunt 27    deceased  . Cancer Cousin 92    female; unknown primary  . Colon cancer Paternal Aunt 44    deceased at 10  . Stomach cancer Paternal Uncle 53    deceased at 22   History  Substance Use Topics  . Smoking status: Former Smoker -- 0.50 packs/day for 10 years    Types: Cigarettes  . Smokeless tobacco: Never Used     Comment: smoked for 10 years  . Alcohol Use: No      Comment: denied alcohol   OB History    No data available     Review of Systems  Constitutional: Negative for fever and chills.  Cardiovascular: Negative for leg swelling.  Gastrointestinal: Negative for nausea, vomiting and abdominal pain.  Genitourinary: Negative for dysuria, urgency and frequency.  Musculoskeletal: Positive for myalgias, back pain and gait problem.  Skin: Negative for color change and wound.  Allergic/Immunologic: Negative for immunocompromised state.  Neurological: Positive for weakness and numbness.  Psychiatric/Behavioral: Negative for self-injury.      Allergies  Aspirin; Ibuprofen; Ativan; and Tramadol hcl  Home Medications   Prior to Admission medications   Medication Sig Start Date End Date Taking? Authorizing Provider  cephALEXin (KEFLEX) 500 MG capsule Take 1 capsule (500 mg total) by mouth 3 (three) times daily. 03/07/14   Velna Hatchet May Agustin, NP  clonazePAM (KLONOPIN) 0.5 MG tablet Take 1 tablet (0.5 mg total) by mouth 2 (two) times daily as needed (anxiety). 03/07/14   Velna Hatchet May Agustin, NP  DULoxetine (CYMBALTA) 20 MG capsule Take 1 capsule (20 mg total) by mouth daily. 03/07/14   Lindwood Qua, NP  meloxicam (MOBIC) 15 MG tablet Take 1 tablet (15 mg total) by mouth daily. 03/07/14   Velna Hatchet May Agustin, NP  ondansetron (ZOFRAN ODT) 8 MG disintegrating tablet Take 1 tablet (8 mg total) by mouth every 8 (eight) hours as needed for nausea or vomiting. 03/07/14   Velna Hatchet May Agustin, NP  ondansetron (ZOFRAN ODT) 8 MG disintegrating tablet Take 1 tablet (8 mg total) by mouth every 8 (eight) hours as needed for nausea or vomiting. 03/07/14   Velna Hatchet May Agustin, NP  QUEtiapine (SEROQUEL) 25 MG tablet Take 1 tablet (25 mg total) by mouth daily. 03/07/14   Velna Hatchet May Agustin, NP  QUEtiapine (SEROQUEL) 50 MG tablet Take 1 tablet (50 mg total) by mouth at bedtime. 03/07/14   Lindwood Qua, NP   Triage Vitals: BP 121/79 mmHg  Pulse 88   Temp(Src) 98.2 F (36.8 C) (Oral)  Resp 18  SpO2 99%  Physical Exam  Constitutional: She appears well-developed and well-nourished. No distress.  HENT:  Head: Normocephalic and atraumatic.  Neck: Neck supple.  Pulmonary/Chest: Effort normal.  Abdominal: Soft. She exhibits no distension and no mass. There is no tenderness. There is no rebound and no guarding.  Musculoskeletal: She exhibits tenderness. She exhibits no edema.  Spine nontender, no crepitus, or stepoffs. Lower extremities:  Strength 5/5, sensation intact, distal pulses intact.  TTP through left buttock and left posterior thigh.  No tenderness of the left hip.  No lower extremity edema.    Neurological: She is alert.  Normal gait  Skin: She is not diaphoretic.  Nursing note and vitals reviewed.   ED Course  Procedures (including critical care time)  DIAGNOSTIC STUDIES: Oxygen Saturation is 99% on room air, normal by my interpretation.    COORDINATION OF CARE: At 1943 Discussed treatment plan with patient which includes injection of pain medication. Patient agrees.   Labs Review Labs Reviewed - No data to display  Imaging Review No results found.   EKG Interpretation None      MDM   Final diagnoses:  Chronic sciatica of left side   Afebrile, nontoxic patient with exacerbation of chronic back pain.  No red flags with history or exam.  Neurovascularly intact.  Emergent imaging not indicated at this time.   IM dilaudid given.  D/C home with orthopedic follow up.  Discussed result, findings, treatment, and follow up  with patient.  Pt given return precautions.  Pt verbalizes understanding and agrees with plan.        I personally performed the services described in this documentation, which was scribed in my presence. The recorded information has been reviewed and is accurate.   Trixie Dredge, PA-C 03/24/14 2053  Loren Racer, MD 03/24/14 [phone]

## 2014-03-24 NOTE — ED Notes (Signed)
Pt c/o left leg and lower back pan from sciatica x 1 week

## 2014-03-25 ENCOUNTER — Telehealth: Payer: Self-pay | Admitting: Family Medicine

## 2014-03-25 NOTE — Telephone Encounter (Signed)
We do not do injections for sciatic pain.

## 2014-03-25 NOTE — Telephone Encounter (Signed)
Pt is having sciatic pain that is unbearable and her orthopaedic md is out of town. Pt would like to know if dr burchette will give her an injection.

## 2014-03-26 NOTE — Telephone Encounter (Signed)
Left message for patient on Vm per Dr. Leonard Schwartz

## 2014-03-30 ENCOUNTER — Encounter: Payer: Self-pay | Admitting: Family Medicine

## 2014-03-30 ENCOUNTER — Ambulatory Visit (INDEPENDENT_AMBULATORY_CARE_PROVIDER_SITE_OTHER): Payer: Managed Care, Other (non HMO) | Admitting: Family Medicine

## 2014-03-30 ENCOUNTER — Telehealth: Payer: Self-pay | Admitting: Family Medicine

## 2014-03-30 VITALS — BP 130/70 | HR 94 | Temp 98.1°F | Wt 142.0 lb

## 2014-03-30 DIAGNOSIS — M5417 Radiculopathy, lumbosacral region: Secondary | ICD-10-CM

## 2014-03-30 DIAGNOSIS — M797 Fibromyalgia: Secondary | ICD-10-CM

## 2014-03-30 MED ORDER — METHYLPREDNISOLONE ACETATE 80 MG/ML IJ SUSP
80.0000 mg | Freq: Once | INTRAMUSCULAR | Status: AC
  Administered 2014-03-30: 80 mg via INTRAMUSCULAR

## 2014-03-30 NOTE — Patient Instructions (Signed)

## 2014-03-30 NOTE — Progress Notes (Signed)
Subjective:    Patient ID: Kara [name], female    DOB: [birth year], 59 y.o.   MRN: [id number]  HPI Patient seen with "throbbing" pain which radiates from her lumbar spine lower lumbar region all the way to the foot at times. She is followed by neurosurgery and pain management. She currently takes oxycodone along with Duragesic. She is on Cymbalta. She does not recall trying Lyrica previously. She actually has follow-up with neurosurgeon next week. She is basically here requesting steroid injection. She has gotten Depo-Medrol in the past which did help. She's had more side effects with oral prednisone with some emotional lability but has tolerated injection without difficulty. She has not had any loss of urine or stool control. Occasional numbness left foot. No weakness.  She also has rheumatoid arthritis and recently went off Humira. She is undecided whether she will go back. She is followed by multiple specialists including psychiatry, pain management, and rheumatology as well as neurosurgery as above.  She is requesting letter to be out of work for a week in January while she seeks alternative medical treatment in [place]- which she has had previously.  She has fibromyalgia pain which remains poorly controlled at times. She is under care of rheumatology and pain management.    Past Medical History  Diagnosis Date  . Fibromyalgia   . 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 Arthritis   . Rheumatoid arthritis(714.0)     autoimmune arthritis; methotrexate once/week  . Depression     takes meds daily  . Post-nasal drip     hx of  . Other specified rheumatoid arthritis, right shoulder 08/01/2011  . Asthma   . Allergy   . Chronic kidney disease   . Urinary incontinence   . Family history of malignant neoplasm of breast   . Chronic pain   . Cellulitis of breast 11/2013    RIGHT BREAST  . Anxiety    Past Surgical History  Procedure Laterality Date  . Shoulder  surgery  2012    right  . Back surgery  2011    lower back fusion l4-5, s1  . Bladder surgery  2009  . Tubal ligation  1988  . Breast enhancement surgery  2003  . Liposuction  2003    abdominal  . Tonsillectomy  1987  . Total shoulder arthroplasty  08/01/2011    Procedure: TOTAL SHOULDER ARTHROPLASTY;  Surgeon: Eulas Post, MD;  Location: [facility] OR;  Service: Orthopedics;  Laterality: Right;  Right total shoulder arthroplasty  . Mass excision  11/03/2011    Procedure: MINOR EXCISION OF MASS;  Surgeon: Wyn Forster., MD;  Location: [facility];  Service: Orthopedics;  Laterality: Left;  debride IP joint, cyst excision left index  . Breast surgery  10/2013    REMOVAL OF BREAST IMPLANTS  . Breast implants removed    . Incision and drainage abscess Right 01/16/2014    Procedure: INCISION AND DRAINAGE AND OF RIGHT BREAST ABCESS;  Surgeon: Glenna Fellows, MD;  Location: [facility];  Service: General;  Laterality: Right;    reports that she has quit smoking. Her smoking use included Cigarettes. She has a 5 pack-year smoking history. She has never used smokeless tobacco. She reports that she does not drink alcohol or use illicit drugs. family history includes Arthritis in her mother; Breast cancer (age of onset: 68) in her maternal aunt; Breast cancer (age of onset: 88) in  her mother; COPD in her father; Cancer (age of onset: 24) in her cousin; Colon cancer (age of onset: 49) in her paternal aunt; Heart disease in her mother; Hypertension in her father; Stomach cancer (age of onset: 19) in her paternal uncle. Allergies  Allergen Reactions  . Aspirin Anaphylaxis  . Ibuprofen Anaphylaxis  . Ativan [Lorazepam] Other (See Comments)    Makes agitated, combative   . Tramadol Hcl Itching and Rash      Review of Systems  Constitutional: Positive for appetite change. Negative for fever and chills.  Genitourinary: Negative for dysuria.  Musculoskeletal: Positive for back pain.    Neurological: Negative for weakness and numbness.       Objective:   Physical Exam  Constitutional: She appears well-developed and well-nourished.  Cardiovascular: Normal rate and regular rhythm.   Pulmonary/Chest: Effort normal and breath sounds normal. No respiratory distress. She has no wheezes. She has no rales.  Musculoskeletal: She exhibits no edema.  Straight leg raises are negative bilaterally  Neurological:  Full-strength lower extremities. She has 2+ knee and ankle reflexes bilaterally. No sensory impairment touch          Assessment & Plan:  Left lumbar radiculopathy pain. Patient benefited from Depo-Medrol in the past. We offered Depo-Medrol 80 mg IM (she has tolerated IM steroids but not oral in past). She is strongly encouraged to keep follow-up with her neurosurgeon. She will discuss with her pain management specialist possible use of medication such as Lyrica Fibromyalgia.  Poor control.  Consider Lyrica as above. She will discuss with pain management.

## 2014-03-30 NOTE — Telephone Encounter (Signed)
Pt would like a referral to a nutritionalist due to her health issues,  Pt also would like a letter to her employer stating she is going to [place] (leaving 1/25- 1/29) for alternative  treatment not available here in [place] for her Rheumatoid arthritis. fibromyalgia , chronic pain syndrome and chronic fatigue.  She would like the letter to state in his opinion,  this treatment would be beneficial to pt's health.  Pt needs letter ASAP, her employer is giving her a hard time.

## 2014-03-30 NOTE — Progress Notes (Signed)
Pre visit review using our clinic review tool, if applicable. No additional management support is needed unless otherwise documented below in the visit note. 

## 2014-03-31 ENCOUNTER — Other Ambulatory Visit: Payer: Self-pay | Admitting: Family Medicine

## 2014-03-31 DIAGNOSIS — M797 Fibromyalgia: Secondary | ICD-10-CM

## 2014-03-31 NOTE — Telephone Encounter (Signed)
Referral is ordered. Pt is aware that letter is ready for pickup

## 2014-03-31 NOTE — Telephone Encounter (Signed)
OK to set up nutrition referral. Would try dx of fibromyalgia- I explained to pt not sure if insurance would cover for this. Letter produced.

## 2014-04-07 ENCOUNTER — Other Ambulatory Visit: Payer: Self-pay | Admitting: Family Medicine

## 2014-04-10 ENCOUNTER — Encounter: Payer: Self-pay | Admitting: *Deleted

## 2014-04-28 ENCOUNTER — Other Ambulatory Visit: Payer: Self-pay | Admitting: Physical Medicine and Rehabilitation

## 2014-04-28 DIAGNOSIS — M545 Low back pain, unspecified: Secondary | ICD-10-CM

## 2014-05-01 ENCOUNTER — Ambulatory Visit
Admission: RE | Admit: 2014-05-01 | Discharge: 2014-05-01 | Disposition: A | Discharge status: Home / Self Care | Payer: Managed Care, Other (non HMO) | Source: Ambulatory Visit | Attending: Physical Medicine and Rehabilitation | Admitting: Physical Medicine and Rehabilitation

## 2014-05-01 DIAGNOSIS — M545 Low back pain, unspecified: Secondary | ICD-10-CM

## 2014-05-01 MED ORDER — OXYCODONE-ACETAMINOPHEN 5-325 MG PO TABS: 2.0000 | ORAL_TABLET | Freq: Once | ORAL | Status: DC

## 2014-05-01 MED ORDER — DIAZEPAM 5 MG PO TABS
10.0000 mg | ORAL_TABLET | Freq: Once | ORAL | Status: AC
  Administered 2014-05-01: 10 mg via ORAL

## 2014-05-01 MED ORDER — OXYCODONE-ACETAMINOPHEN 5-325 MG PO TABS
2.0000 | ORAL_TABLET | Freq: Once | ORAL | Status: AC
  Administered 2014-05-01: 2 via ORAL

## 2014-05-01 MED ORDER — IOHEXOL 180 MG/ML  SOLN
18.0000 mL | Freq: Once | INTRAMUSCULAR | Status: AC | PRN
  Administered 2014-05-01: 18 mL via INTRATHECAL

## 2014-05-01 MED ORDER — ONDANSETRON HCL 4 MG/2ML IJ SOLN: 4.0000 mg | Freq: Four times a day (QID) | INTRAMUSCULAR | Status: DC | PRN

## 2014-05-01 NOTE — Progress Notes (Signed)
Pt states she has been off Adderall and Effexor for the past 3 days. Discharge instructions explained to pt.

## 2014-05-01 NOTE — Discharge Instructions (Signed)
Myelogram Discharge Instructions  1. Go home and rest quietly for the next 24 hours.  It is important to lie flat for the next 24 hours.  Get up only to go to the restroom.  You may lie in the bed or on a couch on your back, your stomach, your left side or your right side.  You may have one pillow under your head.  You may have pillows between your knees while you are on your side or under your knees while you are on your back.  2. DO NOT drive today.  Recline the seat as far back as it will go, while still wearing your seat belt, on the way home.  3. You may get up to go to the bathroom as needed.  You may sit up for 10 minutes to eat.  You may resume your normal diet and medications unless otherwise indicated.  Drink lots of extra fluids today and tomorrow.  4. The incidence of headache, nausea, or vomiting is about 5% (one in 20 patients).  If you develop a headache, lie flat and drink plenty of fluids until the headache goes away.  Caffeinated beverages may be helpful.  If you develop severe nausea and vomiting or a headache that does not go away with flat bed rest, call [phone].  5. You may resume normal activities after your 24 hours of bed rest is over; however, do not exert yourself strongly or do any heavy lifting tomorrow. If when you get up you have a headache when standing, go back to bed and force fluids for another 24 hours.  6. Call your physician for a follow-up appointment.  The results of your myelogram will be sent directly to your physician by the following day.  7. If you have any questions or if complications develop after you arrive home, please call [phone].  Discharge instructions have been explained to the patient.  The patient, or the person responsible for the patient, fully understands these instructions.      May resume Adderall and Effexor on Feb. 6, 2016, after 9:30 am.

## 2014-05-05 ENCOUNTER — Ambulatory Visit: Payer: Self-pay | Admitting: Dietician

## 2014-05-25 ENCOUNTER — Encounter: Payer: Self-pay | Admitting: Family Medicine

## 2014-05-25 ENCOUNTER — Ambulatory Visit (INDEPENDENT_AMBULATORY_CARE_PROVIDER_SITE_OTHER): Payer: Managed Care, Other (non HMO) | Admitting: Family Medicine

## 2014-05-25 VITALS — BP 130/80 | HR 78 | Temp 98.6°F | Wt 136.0 lb

## 2014-05-25 DIAGNOSIS — Z79899 Other long term (current) drug therapy: Secondary | ICD-10-CM

## 2014-05-25 DIAGNOSIS — B351 Tinea unguium: Secondary | ICD-10-CM

## 2014-05-25 MED ORDER — TERBINAFINE HCL 250 MG PO TABS: 250.0000 mg | ORAL_TABLET | Freq: Every day | ORAL | Status: DC

## 2014-05-25 NOTE — Progress Notes (Signed)
Subjective:    Patient ID: Kara [name], female    DOB: 1955-11-07, 59 y.o.   MRN: [id number]  HPI Patient seen with right great toe changes over the past couple months. She briefly was using acrylic nail. When this was removed recently good portion her toenail came off. She has some brittle changes. No associated pain. No history of diabetes. No peripheral vascular disease history. She does have rheumatoid arthritis. She has previously been on Humira but not currently  Past Medical History  Diagnosis Date  . Fibromyalgia   . 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 Arthritis   . Rheumatoid arthritis(714.0)     autoimmune arthritis; methotrexate once/week  . Depression     takes meds daily  . Post-nasal drip     hx of  . Other specified rheumatoid arthritis, right shoulder 08/01/2011  . Asthma   . Allergy   . Chronic kidney disease   . Urinary incontinence   . Family history of malignant neoplasm of breast   . Chronic pain   . Cellulitis of breast 11/2013    RIGHT BREAST  . Anxiety    Past Surgical History  Procedure Laterality Date  . Shoulder surgery  2012    right  . Back surgery  2011    lower back fusion l4-5, s1  . Bladder surgery  2009  . Tubal ligation  1988  . Breast enhancement surgery  2003  . Liposuction  2003    abdominal  . Tonsillectomy  1987  . Total shoulder arthroplasty  08/01/2011    Procedure: TOTAL SHOULDER ARTHROPLASTY;  Surgeon: Eulas Post, MD;  Location: [facility] OR;  Service: Orthopedics;  Laterality: Right;  Right total shoulder arthroplasty  . Mass excision  11/03/2011    Procedure: MINOR EXCISION OF MASS;  Surgeon: Wyn Forster., MD;  Location: [facility];  Service: Orthopedics;  Laterality: Left;  debride IP joint, cyst excision left index  . Breast surgery  10/2013    REMOVAL OF BREAST IMPLANTS  . Breast implants removed    . Incision and drainage abscess Right 01/16/2014    Procedure: INCISION  AND DRAINAGE AND OF RIGHT BREAST ABCESS;  Surgeon: Glenna Fellows, MD;  Location: [facility];  Service: General;  Laterality: Right;    reports that she has quit smoking. Her smoking use included Cigarettes. She has a 5 pack-year smoking history. She has never used smokeless tobacco. She reports that she does not drink alcohol or use illicit drugs. family history includes Arthritis in her mother; Breast cancer (age of onset: 63) in her maternal aunt; Breast cancer (age of onset: 45) in her mother; COPD in her father; Cancer (age of onset: 37) in her cousin; Colon cancer (age of onset: 19) in her paternal aunt; Heart disease in her mother; Hypertension in her father; Stomach cancer (age of onset: 32) in her paternal uncle. Allergies  Allergen Reactions  . Aspirin Anaphylaxis  . Ibuprofen Anaphylaxis  . Ativan [Lorazepam] Other (See Comments)    Makes agitated, combative   . Tramadol Hcl Itching and Rash      Review of Systems  Constitutional: Negative for fever and chills.       Objective:   Physical Exam  Constitutional: She appears well-developed and well-nourished.  Cardiovascular: Normal rate and regular rhythm.   Skin:  Right great toe reveals brittle changes in over half the nails basically come off. Remainder  of nails somewhat thickened and brittle appearing          Assessment & Plan:  Probable onychomycosis right great toe. We discussed options. Patient is interested in treatment with Lamisil. Obtain hepatic panel. If normal start Lamisil 250 mgs once daily for 3 months

## 2014-05-25 NOTE — Progress Notes (Signed)
Pre visit review using our clinic review tool, if applicable. No additional management support is needed unless otherwise documented below in the visit note. 

## 2014-05-25 NOTE — Patient Instructions (Signed)
Ringworm, Nail A fungal infection of the nail (tinea unguium/onychomycosis) is common. It is common as the visible part of the nail is composed of dead cells which have no blood supply to help prevent infection. It occurs because fungi are everywhere and will pick any opportunity to grow on any dead material. Because nails are very slow growing they require up to 2 years of treatment with anti-fungal medications. The entire nail back to the base is infected. This includes approximately  of the nail which you cannot see. If your caregiver has prescribed a medication by mouth, take it every day and as directed. No progress will be seen for at least 6 to 9 months. Do not be disappointed! Because fungi live on dead cells with little or no exposure to blood supply, medication delivery to the infection is slow; thus the cure is slow. It is also why you can observe no progress in the first 6 months. The nail becoming cured is the base of the nail, as it has the blood supply. Topical medication such as creams and ointments are usually not effective. Important in successful treatment of nail fungus is closely following the medication regimen that your doctor prescribes. Sometimes you and your caregiver may elect to speed up this process by surgical removal of all the nails. Even this may still require 6 to 9 months of additional oral medications. See your caregiver as directed. Remember there will be no visible improvement for at least 6 months. See your caregiver sooner if other signs of infection (redness and swelling) develop. Document Released: 03/10/2000 Document Revised: 06/05/2011 Document Reviewed: 05/19/2008 Exit[employer] Patient Information 2015 [employer]. This information is not intended to replace advice given to you by your health care provider. Make sure you discuss any questions you have with your health care provider.  

## 2014-06-03 ENCOUNTER — Telehealth: Payer: Self-pay | Admitting: Family Medicine

## 2014-06-03 MED ORDER — ALBENDAZOLE 200 MG PO TABS: 400.0000 mg | ORAL_TABLET | Freq: Once | ORAL | Status: DC

## 2014-06-03 NOTE — Telephone Encounter (Signed)
Pt is informed. Rx sent to pharmacy  

## 2014-06-03 NOTE — Telephone Encounter (Signed)
Albendazole 400 mg times one dose.

## 2014-06-03 NOTE — Telephone Encounter (Signed)
Pt adopted a dog and dog has ringworms. The dog lick her in face on lips and they (vet) suggest she get med for ringworms. [facility] [address]

## 2014-06-03 NOTE — Telephone Encounter (Signed)
Do they mean roundworm?  Ringworm is a fungal skin infection and we would not treat that unless she had any rash.

## 2014-06-03 NOTE — Telephone Encounter (Signed)
Yes pt mean roundworm.

## 2014-06-17 ENCOUNTER — Other Ambulatory Visit (HOSPITAL_COMMUNITY): Payer: Self-pay | Admitting: Orthopaedic Surgery

## 2014-06-26 ENCOUNTER — Encounter (HOSPITAL_COMMUNITY)
Admission: RE | Admit: 2014-06-26 | Discharge: 2014-06-26 | Disposition: A | Discharge status: Home / Self Care | Payer: Managed Care, Other (non HMO) | Source: Ambulatory Visit | Attending: Orthopaedic Surgery | Admitting: Orthopaedic Surgery

## 2014-06-26 ENCOUNTER — Ambulatory Visit (HOSPITAL_COMMUNITY)
Admission: RE | Admit: 2014-06-26 | Discharge: 2014-06-26 | Disposition: A | Discharge status: Home / Self Care | Payer: Managed Care, Other (non HMO) | Source: Ambulatory Visit | Attending: Orthopaedic Surgery | Admitting: Orthopaedic Surgery

## 2014-06-26 ENCOUNTER — Encounter (HOSPITAL_COMMUNITY): Payer: Self-pay

## 2014-06-26 DIAGNOSIS — M47896 Other spondylosis, lumbar region: Secondary | ICD-10-CM | POA: Insufficient documentation

## 2014-06-26 DIAGNOSIS — M47816 Spondylosis without myelopathy or radiculopathy, lumbar region: Secondary | ICD-10-CM

## 2014-06-26 HISTORY — DX: Chronic fatigue, unspecified: R53.82

## 2014-06-26 HISTORY — DX: Other specified behavioral and emotional disorders with onset usually occurring in childhood and adolescence: F98.8

## 2014-06-26 HISTORY — DX: Myalgic encephalomyelitis/chronic fatigue syndrome: G93.32

## 2014-06-26 HISTORY — DX: Other specified disorders involving the immune mechanism, not elsewhere classified: D89.89

## 2014-06-26 LAB — URINALYSIS, ROUTINE W REFLEX MICROSCOPIC
GLUCOSE, UA: NEGATIVE mg/dL
Ketones, ur: NEGATIVE mg/dL
NITRITE: NEGATIVE
PROTEIN: NEGATIVE mg/dL
Specific Gravity, Urine: 1.038 — ABNORMAL HIGH (ref 1.005–1.030)
Urobilinogen, UA: 0.2 mg/dL (ref 0.0–1.0)
pH: 5 (ref 5.0–8.0)

## 2014-06-26 LAB — SURGICAL PCR SCREEN
MRSA, PCR: NEGATIVE
Staphylococcus aureus: POSITIVE — AB

## 2014-06-26 LAB — CBC
HCT: 38.6 % (ref 36.0–46.0)
Hemoglobin: 12.7 g/dL (ref 12.0–15.0)
MCH: 27.8 pg (ref 26.0–34.0)
MCHC: 32.9 g/dL (ref 30.0–36.0)
MCV: 84.5 fL (ref 78.0–100.0)
PLATELETS: 313 10*3/uL (ref 150–400)
RBC: 4.57 MIL/uL (ref 3.87–5.11)
RDW: 13.1 % (ref 11.5–15.5)
WBC: 6.7 10*3/uL (ref 4.0–10.5)

## 2014-06-26 LAB — COMPREHENSIVE METABOLIC PANEL
ALBUMIN: 4 g/dL (ref 3.5–5.2)
ALK PHOS: 95 U/L (ref 39–117)
ALT: 19 U/L (ref 0–35)
AST: 22 U/L (ref 0–37)
Anion gap: 9 (ref 5–15)
BILIRUBIN TOTAL: 0.4 mg/dL (ref 0.3–1.2)
BUN: 20 mg/dL (ref 6–23)
CHLORIDE: 102 mmol/L (ref 96–112)
CO2: 28 mmol/L (ref 19–32)
Calcium: 9.4 mg/dL (ref 8.4–10.5)
Creatinine, Ser: 0.63 mg/dL (ref 0.50–1.10)
GFR calc Af Amer: >90 mL/min (ref >90)
GFR calc non Af Amer: >90 mL/min (ref >90)
Glucose, Bld: 85 mg/dL (ref 70–99)
Potassium: 3.8 mmol/L (ref 3.5–5.1)
Sodium: 139 mmol/L (ref 135–145)
TOTAL PROTEIN: 7.1 g/dL (ref 6.0–8.3)

## 2014-06-26 LAB — TYPE AND SCREEN
ABO/RH(D): O NEG
Antibody Screen: NEGATIVE

## 2014-06-26 LAB — PROTIME-INR
INR: 1.06 (ref 0.00–1.49)
PROTHROMBIN TIME: 13.9 s (ref 11.6–15.2)

## 2014-06-26 LAB — URINE MICROSCOPIC-ADD ON

## 2014-06-26 NOTE — Pre-Procedure Instructions (Signed)
Timica Marcom  06/26/2014   Your procedure is scheduled on:  Friday , April 8  Report to [facility] Admitting at 0530 AM.  Call this number if you have problems the morning of surgery: [phone]   Remember:   Do not eat food or drink liquids after midnight. Thursday night   Take these medicines the morning of surgery with A SIP OF WATER: oxycodone if needed for pain, venlafexine XR    Do not wear jewelry, make-up or nail polish.  Do not wear lotions, powders, or perfumes. You may wear deodorant.  Do not shave 48 hours prior to surgery.    Do not bring valuables to the hospital.  [facility] : [facility] is not responsible   for any belongings or valuables.               Contacts, dentures or bridgework may not be worn into surgery.  Leave suitcase in the car. After surgery it may be brought to your room.  For patients admitted to the hospital, discharge time is determined by your                treatment team.      Special Instructions:  Follow "Preparing for surgery" fact sheet   Please read over the following fact sheets that you were given: Pain Booklet, Coughing and Deep Breathing, Blood Transfusion Information and Surgical Site Infection Prevention

## 2014-06-29 NOTE — Progress Notes (Signed)
Mupirocin Ointment Rx called into [facility] [address] for positive PCR of staph. Attempted to notify pt, no answer at this time. Will try again later.

## 2014-06-29 NOTE — Progress Notes (Signed)
Left message on pt's voicemail notifying her of positive PCR of staph.

## 2014-07-02 MED ORDER — CHLORHEXIDINE GLUCONATE 4 % EX LIQD
60.0000 mL | Freq: Once | CUTANEOUS | Status: DC
  Filled 2014-07-02: qty 60

## 2014-07-02 MED ORDER — CEFAZOLIN SODIUM-DEXTROSE 2-3 GM-% IV SOLR
2.0000 g | INTRAVENOUS | Status: AC
  Administered 2014-07-03 (×2): 2 g via INTRAVENOUS
  Filled 2014-07-02: qty 50

## 2014-07-02 NOTE — Progress Notes (Signed)
F/u call made to Pain management, requested that they send records, last OV note to [employer]. Spoke with [employer] .

## 2014-07-02 NOTE — H&P (Signed)
Tiffanni Scarfo is an 59 y.o. female.   A 59 year old female returns with persistent problems with chronic back pain, leg pain, worse pain on the left than right with pain that radiates down to her ankle.  She has pain when she turns, twists, has some pain with prolonged standing.  No change with flexion or extension of the lumbar spine.  The patient has had multiple sets of injections by Dr. Maurice Small and recent myelogram CT scan 05/01/2014.  The patient has been followed at [facility], Dr. Ardell Isaacs.     PAST SURGICAL HISTORY:   Previous surgery lumbar fusion in [place] with XLIF cage, lateral screws at L4-5 through the vertebral body and then Affix NuVasive spinous process plate.  The patient has had shoulder arthroplasty for avascular necrosis.  She has been on chronic pain medication for chronic back pain.     CURRENT MEDICATIONS:   She takes Percocet 10/325, 2-4 a day.  Meloxicam 15 mg daily.  Fentanyl patch 75 mg.  Effexor 75 mg daily and she also has been on Xanax in the past.  Quetiapine 25 mg daily.  The patient switched to ibuprofen.     SOCIAL HISTORY:   The patient is married.  She never drinks alcohol.  She is a nonsmoker.  She is not working currently.     Past Medical History  Diagnosis Date  . Fibromyalgia   . 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 Arthritis   . Rheumatoid arthritis(714.0)     autoimmune arthritis; methotrexate once/week  . Depression     takes meds daily  . Post-nasal drip     hx of  . Other specified rheumatoid arthritis, right shoulder 08/01/2011  . Asthma   . Allergy   . Chronic kidney disease   . Urinary incontinence   . Family history of malignant neoplasm of breast   . Chronic pain     goes to [facility] for pain control  . Cellulitis of breast 11/2013    RIGHT BREAST  . Anxiety   . ADD (attention deficit disorder)     on Adderal  . Chronic fatigue and immune dysfunction  syndrome     Past Surgical History  Procedure Laterality Date  . Shoulder surgery  2012    right  . Back surgery  2011    lower back fusion l4-5, s1  . Bladder surgery  2009  . Tubal ligation  1988  . Breast enhancement surgery  2003  . Liposuction  2003    abdominal  . Tonsillectomy  1987  . Total shoulder arthroplasty  08/01/2011    Procedure: TOTAL SHOULDER ARTHROPLASTY;  Surgeon: Eulas Post, MD;  Location: [facility] OR;  Service: Orthopedics;  Laterality: Right;  Right total shoulder arthroplasty  . Mass excision  11/03/2011    Procedure: MINOR EXCISION OF MASS;  Surgeon: Wyn Forster., MD;  Location: [facility];  Service: Orthopedics;  Laterality: Left;  debride IP joint, cyst excision left index  . Breast surgery  10/2013    REMOVAL OF BREAST IMPLANTS  . Breast implants removed    . Incision and drainage abscess Right 01/16/2014    Procedure: INCISION AND DRAINAGE AND OF RIGHT BREAST ABCESS;  Surgeon: Glenna Fellows, MD;  Location: [facility] ORS;  Service: General;  Laterality: Right;    Family History  Problem Relation Age of Onset  . Arthritis Mother   .  Heart disease Mother     ?psvt  . Breast cancer Mother 35    TAH/BSO  . COPD Father   . Hypertension Father   . Breast cancer Maternal Aunt 27    deceased  . Cancer Cousin 37    female; unknown primary  . Colon cancer Paternal Aunt 58    deceased at 97  . Stomach cancer Paternal Uncle 25    deceased at 81   Social History:  reports that she has quit smoking. Her smoking use included Cigarettes. She has a 5 pack-year smoking history. She has never used smokeless tobacco. She reports that she does not drink alcohol or use illicit drugs.  Allergies:  Allergies  Allergen Reactions  . Aspirin Anaphylaxis  . Ibuprofen Anaphylaxis  . Ativan [Lorazepam] Other (See Comments)    Makes agitated, combative   . Tramadol Hcl Itching and Rash    No prescriptions prior to admission    No results found for  this or any previous visit (from the past 48 hour(s)). No results found.  ROS REVIEW OF SYSTEMS:   Positive for fibromyalgia, chronic back pain.  No cardiac history.  Positive history of insomnia, anxiety, depression, osteoporosis.    There were no vitals taken for this visit. Physical Exam  PHYSICAL EXAMINATION:  The patient is 5 feet 4 inches, 139 pounds, alert and oriented, WD, WN, NAD.  Extraocular movements intact.  Upper extremity reflexes are 2+ and symmetrical.  Healed shoulder arthroplasty incision.  Lungs are clear to auscultation.  Heart:  Regular rate and rhythm.  Abdomen soft and nontender.  The lumbar incision is well-healed.  She has sciatic notch tenderness.  Pain with straight leg raise at 70 degrees on the left, negative at 90 degrees right.  Trace ankle jerk symmetrical, 2+ knee jerk.  Trochanteric bursa is nontender.  Negative faber test.  She was able to heel and toe walk.        RADIOGRAPHS/TEST:   Lumbar myelogram CT scan is reviewed from 05/01/2014.  This shows cage subsidence with increased AP diameter over the vertebral body with cage subsidence causing pseudo-listhesis at that level.  Mild anterior extradural defect.  At 5-1 there is a shift on flexion/extension myelogram from 5 mm to 10 mm with instability, degenerative facet changes seen on CT and at 5-1 there is advance facet arthropathy, broad based disk protrusion bilateral, lateral recess stenosis and bilateral neural foramen stenosis with compression in both areas.  Mild SI joint changes.  At the L4-5 level with the cage subsided, there does not appear to be loosening of the cage, difficult to determine if bone is bridged.  There is some lateral bone present, but I cannot determine if it is completely consolidated.  There is a spinal process plate posteriorly at the L4-5 level.      ASSESSMENT:  Chronic low back pain, chronic pain medication.  She has instability and anterolisthesis degenerative in nature at the 5-1  level with shifting of 5 mm on flexion/extension.    PLAN:  She would require a Guild decompression, removal of posterior elements at the L5 level interbody fusion at 5-1.  The posterior spinous process plate needs to be removed, this was made by [facility] and is available in-house at the hospital for removal.  We will check for motion at the 4-5 level and if there is any suggestion of motion, proceed with pedicle instrumentation at the 4-5 level as well as the 5-1 level which will be fused.  Bilateral bone graft to be added for her instability problem in both gutters.  At the 4-5 level she has some lateral recess narrowing so she would need a repeat decompression at the 4-5 level if it is moving.  If 4-5 does not have any motion then I do not think that the lateral recess narrowing would give her a problem.  Likely stay in the hospital for 2 days.  The patient has requested staying extra days since she stayed longer for a shoulder arthroplasty.  I discussed with her that her admission is based on discharge criteria, once they are achieved then she would be ready to be discharged and that generally is 2 days.  Risks of pseudoarthrosis, nonunion, dural tear, spinal fluid leakage, dural repair, screw breakage, pedicle screw malposition all discussed.  I discussed with her at length that the pedicle screws in L5 would have to be short due to the screws that are placed from a previous device, but they could still come all the way through the pedicle and stop short of her vertebral body with adequate purchase.  Additional bone graft would be added lateral in addition to using local bone in the interbody cage and also anterior to the cage.  Risks of DVT, anesthetic complications were discussed.  Outline plan was discussed.  We looked at diagrams, reviewed the myelogram CT scan.  I gave her a copy of her scan.  The patient states she would like to proceed.  Questions were elicited and answered.   Shirlean Berman M 07/02/2014,  11:22 PM

## 2014-07-03 ENCOUNTER — Inpatient Hospital Stay (HOSPITAL_COMMUNITY)
Admission: RE | Admit: 2014-07-03 | Discharge: 2014-07-06 | DRG: 460 | Disposition: A | Discharge status: Home Health | Payer: Managed Care, Other (non HMO) | Source: Ambulatory Visit | Attending: Orthopaedic Surgery | Admitting: Orthopaedic Surgery

## 2014-07-03 ENCOUNTER — Inpatient Hospital Stay (HOSPITAL_COMMUNITY): Payer: Managed Care, Other (non HMO)

## 2014-07-03 ENCOUNTER — Inpatient Hospital Stay (HOSPITAL_COMMUNITY): Payer: Managed Care, Other (non HMO) | Admitting: Anesthesiology

## 2014-07-03 ENCOUNTER — Encounter (HOSPITAL_COMMUNITY): Payer: Self-pay | Admitting: *Deleted

## 2014-07-03 ENCOUNTER — Encounter (HOSPITAL_COMMUNITY)
Admission: RE | Disposition: A | Discharge status: Home Health | Payer: Managed Care, Other (non HMO) | Source: Ambulatory Visit | Attending: Orthopaedic Surgery

## 2014-07-03 DIAGNOSIS — Z87891 Personal history of nicotine dependence: Secondary | ICD-10-CM | POA: Diagnosis not present

## 2014-07-03 DIAGNOSIS — I959 Hypotension, unspecified: Secondary | ICD-10-CM | POA: Diagnosis not present

## 2014-07-03 DIAGNOSIS — Z96611 Presence of right artificial shoulder joint: Secondary | ICD-10-CM | POA: Diagnosis present

## 2014-07-03 DIAGNOSIS — Z79899 Other long term (current) drug therapy: Secondary | ICD-10-CM

## 2014-07-03 DIAGNOSIS — M797 Fibromyalgia: Secondary | ICD-10-CM | POA: Diagnosis present

## 2014-07-03 DIAGNOSIS — M960 Pseudarthrosis after fusion or arthrodesis: Principal | ICD-10-CM | POA: Diagnosis present

## 2014-07-03 DIAGNOSIS — Y831 Surgical operation with implant of artificial internal device as the cause of abnormal reaction of the patient, or of later complication, without mention of misadventure at the time of the procedure: Secondary | ICD-10-CM | POA: Diagnosis present

## 2014-07-03 DIAGNOSIS — M4806 Spinal stenosis, lumbar region: Secondary | ICD-10-CM | POA: Diagnosis present

## 2014-07-03 DIAGNOSIS — M4316 Spondylolisthesis, lumbar region: Secondary | ICD-10-CM | POA: Diagnosis present

## 2014-07-03 DIAGNOSIS — M069 Rheumatoid arthritis, unspecified: Secondary | ICD-10-CM | POA: Diagnosis present

## 2014-07-03 DIAGNOSIS — M549 Dorsalgia, unspecified: Secondary | ICD-10-CM | POA: Diagnosis present

## 2014-07-03 DIAGNOSIS — F329 Major depressive disorder, single episode, unspecified: Secondary | ICD-10-CM | POA: Diagnosis present

## 2014-07-03 DIAGNOSIS — Z9889 Other specified postprocedural states: Secondary | ICD-10-CM

## 2014-07-03 DIAGNOSIS — Z419 Encounter for procedure for purposes other than remedying health state, unspecified: Secondary | ICD-10-CM

## 2014-07-03 SURGERY — POSTERIOR LUMBAR FUSION 2 WITH HARDWARE REMOVAL
Anesthesia: General | Site: Back

## 2014-07-03 MED ORDER — DULOXETINE HCL 20 MG PO CPEP: 20.0000 mg | ORAL_CAPSULE | Freq: Every day | ORAL | Status: DC

## 2014-07-03 MED ORDER — PROPOFOL 10 MG/ML IV BOLUS
INTRAVENOUS | Status: DC | PRN
  Administered 2014-07-03: 150 mg via INTRAVENOUS

## 2014-07-03 MED ORDER — MIDAZOLAM HCL 2 MG/2ML IJ SOLN
INTRAMUSCULAR | Status: AC
  Filled 2014-07-03: qty 2

## 2014-07-03 MED ORDER — PROPOFOL 10 MG/ML IV BOLUS
INTRAVENOUS | Status: AC
  Filled 2014-07-03: qty 20

## 2014-07-03 MED ORDER — ROCURONIUM BROMIDE 100 MG/10ML IV SOLN
INTRAVENOUS | Status: DC | PRN
  Administered 2014-07-03: 10 mg via INTRAVENOUS
  Administered 2014-07-03: 40 mg via INTRAVENOUS

## 2014-07-03 MED ORDER — OXYCODONE-ACETAMINOPHEN 5-325 MG PO TABS
1.0000 | ORAL_TABLET | ORAL | Status: DC | PRN
  Administered 2014-07-03 – 2014-07-04 (×4): 1 via ORAL
  Filled 2014-07-03 (×5): qty 1

## 2014-07-03 MED ORDER — DOCUSATE SODIUM 100 MG PO CAPS
100.0000 mg | ORAL_CAPSULE | Freq: Two times a day (BID) | ORAL | Status: DC
  Administered 2014-07-04 – 2014-07-06 (×6): 100 mg via ORAL
  Filled 2014-07-03 (×6): qty 1

## 2014-07-03 MED ORDER — LIDOCAINE HCL (CARDIAC) 20 MG/ML IV SOLN
INTRAVENOUS | Status: DC | PRN
  Administered 2014-07-03: 60 mg via INTRAVENOUS

## 2014-07-03 MED ORDER — PROMETHAZINE HCL 25 MG/ML IJ SOLN: 6.2500 mg | INTRAMUSCULAR | Status: DC | PRN

## 2014-07-03 MED ORDER — ZOLPIDEM TARTRATE 5 MG PO TABS
5.0000 mg | ORAL_TABLET | Freq: Every evening | ORAL | Status: DC | PRN
Start: 2014-07-03 — End: 2014-07-06
  Administered 2014-07-03 – 2014-07-05 (×3): 5 mg via ORAL
  Filled 2014-07-03 (×3): qty 1

## 2014-07-03 MED ORDER — FENTANYL CITRATE 0.05 MG/ML IJ SOLN
INTRAMUSCULAR | Status: DC | PRN
  Administered 2014-07-03: 50 ug via INTRAVENOUS
  Administered 2014-07-03: 150 ug via INTRAVENOUS
  Administered 2014-07-03 (×5): 50 ug via INTRAVENOUS

## 2014-07-03 MED ORDER — HYDROMORPHONE HCL 1 MG/ML IJ SOLN
INTRAMUSCULAR | Status: DC | PRN
  Administered 2014-07-03: 1 mg via INTRAVENOUS
  Administered 2014-07-03 (×2): 0.5 mg via INTRAVENOUS

## 2014-07-03 MED ORDER — KETAMINE HCL 100 MG/ML IJ SOLN
INTRAMUSCULAR | Status: AC
  Filled 2014-07-03: qty 1

## 2014-07-03 MED ORDER — OXYCODONE HCL 5 MG PO TABS
5.0000 mg | ORAL_TABLET | ORAL | Status: DC | PRN
  Administered 2014-07-03 – 2014-07-04 (×4): 5 mg via ORAL
  Filled 2014-07-03 (×5): qty 1

## 2014-07-03 MED ORDER — KETOROLAC TROMETHAMINE 30 MG/ML IJ SOLN: 30.0000 mg | Freq: Three times a day (TID) | INTRAMUSCULAR | Status: DC

## 2014-07-03 MED ORDER — ACETAMINOPHEN 325 MG PO TABS: 650.0000 mg | ORAL_TABLET | ORAL | Status: DC | PRN

## 2014-07-03 MED ORDER — BUPIVACAINE LIPOSOME 1.3 % IJ SUSP
20.0000 mL | INTRAMUSCULAR | Status: AC
  Administered 2014-07-03: 20 mL
  Filled 2014-07-03: qty 20

## 2014-07-03 MED ORDER — THROMBIN 20000 UNITS EX SOLR
CUTANEOUS | Status: AC
  Filled 2014-07-03: qty 20000

## 2014-07-03 MED ORDER — METHOCARBAMOL 1000 MG/10ML IJ SOLN
500.0000 mg | INTRAVENOUS | Status: AC
  Administered 2014-07-03: 500 mg via INTRAVENOUS
  Filled 2014-07-03: qty 5

## 2014-07-03 MED ORDER — TERBINAFINE HCL 250 MG PO TABS: 250.0000 mg | ORAL_TABLET | Freq: Every day | ORAL | Status: DC

## 2014-07-03 MED ORDER — FENTANYL CITRATE 0.05 MG/ML IJ SOLN
INTRAMUSCULAR | Status: AC
  Filled 2014-07-03: qty 5

## 2014-07-03 MED ORDER — LEVOMILNACIPRAN HCL ER 120 MG PO CP24: 120.0000 mg | ORAL_CAPSULE | Freq: Every day | ORAL | Status: DC

## 2014-07-03 MED ORDER — HYDROMORPHONE HCL 1 MG/ML IJ SOLN
INTRAMUSCULAR | Status: AC
  Administered 2014-07-03: 0.5 mg via INTRAVENOUS
  Filled 2014-07-03: qty 1

## 2014-07-03 MED ORDER — PHENOL 1.4 % MT LIQD: 1.0000 | OROMUCOSAL | Status: DC | PRN

## 2014-07-03 MED ORDER — METHOCARBAMOL 500 MG PO TABS
500.0000 mg | ORAL_TABLET | Freq: Four times a day (QID) | ORAL | Status: DC | PRN
  Administered 2014-07-03 – 2014-07-06 (×11): 500 mg via ORAL
  Filled 2014-07-03 (×12): qty 1

## 2014-07-03 MED ORDER — HYDROMORPHONE HCL 1 MG/ML IJ SOLN
INTRAMUSCULAR | Status: AC
  Filled 2014-07-03: qty 1

## 2014-07-03 MED ORDER — OXYCODONE-ACETAMINOPHEN 10-325 MG PO TABS
1.0000 | ORAL_TABLET | ORAL | Status: DC | PRN
Start: 2014-07-03 — End: 2014-07-03

## 2014-07-03 MED ORDER — FENTANYL 50 MCG/HR TD PT72
75.0000 ug | MEDICATED_PATCH | TRANSDERMAL | Status: DC
  Administered 2014-07-05: 75 ug via TRANSDERMAL
  Filled 2014-07-03 (×2): qty 1

## 2014-07-03 MED ORDER — MIDAZOLAM HCL 5 MG/5ML IJ SOLN
INTRAMUSCULAR | Status: DC | PRN
  Administered 2014-07-03 (×2): 2 mg via INTRAVENOUS

## 2014-07-03 MED ORDER — LACTATED RINGERS IV SOLN
INTRAVENOUS | Status: DC | PRN
Start: 2014-07-03 — End: 2014-07-03
  Administered 2014-07-03 (×4): via INTRAVENOUS

## 2014-07-03 MED ORDER — PHENYLEPHRINE HCL 10 MG/ML IJ SOLN
INTRAMUSCULAR | Status: DC | PRN
  Administered 2014-07-03: 40 ug via INTRAVENOUS
  Administered 2014-07-03 (×2): 80 ug via INTRAVENOUS
  Administered 2014-07-03: 40 ug via INTRAVENOUS
  Administered 2014-07-03 (×2): 80 ug via INTRAVENOUS
  Administered 2014-07-03 (×3): 40 ug via INTRAVENOUS

## 2014-07-03 MED ORDER — ACETAMINOPHEN 650 MG RE SUPP: 650.0000 mg | RECTAL | Status: DC | PRN

## 2014-07-03 MED ORDER — BUPIVACAINE HCL (PF) 0.25 % IJ SOLN
INTRAMUSCULAR | Status: DC | PRN
  Administered 2014-07-03: 20 mL

## 2014-07-03 MED ORDER — SODIUM CHLORIDE 0.9 % IJ SOLN: 3.0000 mL | INTRAMUSCULAR | Status: DC | PRN

## 2014-07-03 MED ORDER — POTASSIUM CHLORIDE IN NACL 20-0.45 MEQ/L-% IV SOLN
INTRAVENOUS | Status: DC
  Administered 2014-07-03: 21:00:00 via INTRAVENOUS
  Filled 2014-07-03 (×9): qty 1000

## 2014-07-03 MED ORDER — MENTHOL 3 MG MT LOZG: 1.0000 | LOZENGE | OROMUCOSAL | Status: DC | PRN

## 2014-07-03 MED ORDER — ONDANSETRON HCL 4 MG/2ML IJ SOLN
INTRAMUSCULAR | Status: AC
  Filled 2014-07-03: qty 2

## 2014-07-03 MED ORDER — FENTANYL 25 MCG/HR TD PT72
25.0000 ug | MEDICATED_PATCH | TRANSDERMAL | Status: DC
  Administered 2014-07-03: 25 ug via TRANSDERMAL
  Filled 2014-07-03: qty 1

## 2014-07-03 MED ORDER — KETAMINE HCL 100 MG/ML IJ SOLN
INTRAMUSCULAR | Status: DC | PRN
  Administered 2014-07-03: 10 mg via INTRAVENOUS
  Administered 2014-07-03: 20 mg via INTRAVENOUS
  Administered 2014-07-03: 30 mg via INTRAVENOUS
  Administered 2014-07-03 (×3): 10 mg via INTRAVENOUS
  Administered 2014-07-03 (×2): 20 mg via INTRAVENOUS

## 2014-07-03 MED ORDER — POLYETHYLENE GLYCOL 3350 17 G PO PACK: 17.0000 g | PACK | Freq: Every day | ORAL | Status: DC | PRN

## 2014-07-03 MED ORDER — ALBENDAZOLE 200 MG PO TABS: 400.0000 mg | ORAL_TABLET | Freq: Once | ORAL | Status: DC

## 2014-07-03 MED ORDER — SODIUM CHLORIDE 0.9 % IJ SOLN
3.0000 mL | Freq: Two times a day (BID) | INTRAMUSCULAR | Status: DC
  Administered 2014-07-05: 3 mL via INTRAVENOUS

## 2014-07-03 MED ORDER — SODIUM CHLORIDE 0.9 % IV SOLN: 250.0000 mL | INTRAVENOUS | Status: DC

## 2014-07-03 MED ORDER — MIDAZOLAM HCL 2 MG/2ML IJ SOLN
2.0000 mg | Freq: Once | INTRAMUSCULAR | Status: AC
  Administered 2014-07-03: 2 mg via INTRAVENOUS

## 2014-07-03 MED ORDER — ACETAMINOPHEN 10 MG/ML IV SOLN
INTRAVENOUS | Status: AC
  Filled 2014-07-03: qty 100

## 2014-07-03 MED ORDER — CEFAZOLIN SODIUM 1-5 GM-% IV SOLN
1.0000 g | Freq: Three times a day (TID) | INTRAVENOUS | Status: AC
Start: 2014-07-03 — End: 2014-07-04
  Administered 2014-07-03 – 2014-07-04 (×2): 1 g via INTRAVENOUS
  Filled 2014-07-03 (×4): qty 50

## 2014-07-03 MED ORDER — PROMETHAZINE HCL 25 MG/ML IJ SOLN
INTRAMUSCULAR | Status: AC
  Filled 2014-07-03: qty 1

## 2014-07-03 MED ORDER — HYDROMORPHONE HCL 1 MG/ML IJ SOLN
0.2500 mg | INTRAMUSCULAR | Status: DC | PRN
  Administered 2014-07-03 (×4): 0.5 mg via INTRAVENOUS

## 2014-07-03 MED ORDER — DEXAMETHASONE SODIUM PHOSPHATE 4 MG/ML IJ SOLN
INTRAMUSCULAR | Status: AC
  Filled 2014-07-03: qty 2

## 2014-07-03 MED ORDER — SODIUM CHLORIDE 0.9 % IV SOLN
INTRAVENOUS | Status: DC | PRN
Start: 2014-07-03 — End: 2014-07-03
  Administered 2014-07-03: 11:00:00 via INTRAVENOUS

## 2014-07-03 MED ORDER — PHENYLEPHRINE 40 MCG/ML (10ML) SYRINGE FOR IV PUSH (FOR BLOOD PRESSURE SUPPORT)
PREFILLED_SYRINGE | INTRAVENOUS | Status: AC
  Filled 2014-07-03: qty 20

## 2014-07-03 MED ORDER — DEXAMETHASONE SODIUM PHOSPHATE 10 MG/ML IJ SOLN
INTRAMUSCULAR | Status: DC | PRN
  Administered 2014-07-03: 10 mg via INTRAVENOUS

## 2014-07-03 MED ORDER — AMPHETAMINE-DEXTROAMPHETAMINE 10 MG PO TABS
30.0000 mg | ORAL_TABLET | Freq: Three times a day (TID) | ORAL | Status: DC
  Administered 2014-07-04 – 2014-07-06 (×4): 30 mg via ORAL
  Filled 2014-07-03 (×6): qty 3

## 2014-07-03 MED ORDER — ACETAMINOPHEN 10 MG/ML IV SOLN
INTRAVENOUS | Status: DC | PRN
  Administered 2014-07-03: 1000 mg via INTRAVENOUS

## 2014-07-03 MED ORDER — BUPIVACAINE HCL (PF) 0.25 % IJ SOLN
INTRAMUSCULAR | Status: AC
  Filled 2014-07-03: qty 30

## 2014-07-03 MED ORDER — THROMBIN 20000 UNITS EX KIT
PACK | CUTANEOUS | Status: DC | PRN
  Administered 2014-07-03: 20000 [IU] via TOPICAL

## 2014-07-03 MED ORDER — ONDANSETRON HCL 4 MG/2ML IJ SOLN
INTRAMUSCULAR | Status: DC | PRN
  Administered 2014-07-03: 4 mg via INTRAVENOUS

## 2014-07-03 MED ORDER — QUETIAPINE FUMARATE 25 MG PO TABS: 25.0000 mg | ORAL_TABLET | Freq: Every day | ORAL | Status: DC

## 2014-07-03 MED ORDER — VENLAFAXINE HCL ER 75 MG PO CP24
75.0000 mg | ORAL_CAPSULE | Freq: Every day | ORAL | Status: DC
  Administered 2014-07-04 – 2014-07-06 (×3): 75 mg via ORAL
  Filled 2014-07-03 (×3): qty 1

## 2014-07-03 MED ORDER — LIDOCAINE HCL (CARDIAC) 20 MG/ML IV SOLN
INTRAVENOUS | Status: AC
  Filled 2014-07-03: qty 5

## 2014-07-03 MED ORDER — QUETIAPINE FUMARATE 50 MG PO TABS: 50.0000 mg | ORAL_TABLET | Freq: Every day | ORAL | Status: DC

## 2014-07-03 MED ORDER — CLONAZEPAM 0.5 MG PO TABS
0.5000 mg | ORAL_TABLET | Freq: Two times a day (BID) | ORAL | Status: DC | PRN
  Administered 2014-07-03 – 2014-07-06 (×7): 0.5 mg via ORAL
  Filled 2014-07-03 (×7): qty 1

## 2014-07-03 MED ORDER — METHOCARBAMOL 1000 MG/10ML IJ SOLN
500.0000 mg | Freq: Four times a day (QID) | INTRAVENOUS | Status: DC | PRN
  Filled 2014-07-03: qty 5

## 2014-07-03 MED ORDER — ONDANSETRON HCL 4 MG/2ML IJ SOLN: 4.0000 mg | INTRAMUSCULAR | Status: DC | PRN

## 2014-07-03 MED ORDER — ROCURONIUM BROMIDE 50 MG/5ML IV SOLN
INTRAVENOUS | Status: AC
  Filled 2014-07-03: qty 1

## 2014-07-03 MED ORDER — ALPRAZOLAM 0.5 MG PO TABS
0.5000 mg | ORAL_TABLET | Freq: Three times a day (TID) | ORAL | Status: DC | PRN
  Administered 2014-07-03 – 2014-07-06 (×9): 0.5 mg via ORAL
  Filled 2014-07-03 (×9): qty 1

## 2014-07-03 MED ORDER — HYDROMORPHONE HCL 1 MG/ML IJ SOLN
1.0000 mg | INTRAMUSCULAR | Status: DC | PRN
  Administered 2014-07-03 – 2014-07-04 (×5): 1 mg via INTRAVENOUS
  Filled 2014-07-03 (×5): qty 1

## 2014-07-03 MED FILL — Heparin Sodium (Porcine) Inj 1000 Unit/ML: INTRAMUSCULAR | Qty: 30 | Status: AC

## 2014-07-03 MED FILL — Sodium Chloride IV Soln 0.9%: INTRAVENOUS | Qty: 1000 | Status: AC

## 2014-07-03 MED FILL — Sodium Chloride Irrigation Soln 0.9%: Qty: 3000 | Status: AC

## 2014-07-03 SURGICAL SUPPLY — 73 items
BANDAGE ELASTIC 6 VELCRO ST LF (GAUZE/BANDAGES/DRESSINGS) IMPLANT
BLADE SURG 10 STRL SS (BLADE) ×2 IMPLANT
BLADE SURG ROTATE 9660 (MISCELLANEOUS) IMPLANT
BUR RND DIAMOND ELITE 4.0 (BURR) ×2 IMPLANT
BUR ROUND FLUTED 4 SOFT TCH (BURR) IMPLANT
CAP SPINAL LOCKING TI (Cap) ×12 IMPLANT
CORDS BIPOLAR (ELECTRODE) ×2 IMPLANT
COVER MAYO STAND STRL (DRAPES) ×4 IMPLANT
COVER SURGICAL LIGHT HANDLE (MISCELLANEOUS) ×2 IMPLANT
DECANTER SPIKE VIAL GLASS SM (MISCELLANEOUS) ×2 IMPLANT
DERMABOND ADVANCED (GAUZE/BANDAGES/DRESSINGS) ×1
DERMABOND ADVANCED .7 DNX12 (GAUZE/BANDAGES/DRESSINGS) ×1 IMPLANT
DRAPE C-ARM 42X72 X-RAY (DRAPES) ×2 IMPLANT
DRAPE INCISE IOBAN 66X45 STRL (DRAPES) ×2 IMPLANT
DRAPE MICROSCOPE LEICA (MISCELLANEOUS) ×2 IMPLANT
DRAPE ORTHO SPLIT 77X108 STRL (DRAPES) ×1
DRAPE PROXIMA HALF (DRAPES) ×8 IMPLANT
DRAPE SURG 17X23 STRL (DRAPES) ×6 IMPLANT
DRAPE SURG ORHT 6 SPLT 77X108 (DRAPES) ×1 IMPLANT
DRAPE TABLE COVER HEAVY DUTY (DRAPES) ×2 IMPLANT
DRSG EMULSION OIL 3X3 NADH (GAUZE/BANDAGES/DRESSINGS) ×2 IMPLANT
DRSG MEPILEX BORDER 4X4 (GAUZE/BANDAGES/DRESSINGS) ×2 IMPLANT
DRSG MEPILEX BORDER 4X8 (GAUZE/BANDAGES/DRESSINGS) ×2 IMPLANT
DRSG PAD ABDOMINAL 8X10 ST (GAUZE/BANDAGES/DRESSINGS) ×4 IMPLANT
DURAPREP 26ML APPLICATOR (WOUND CARE) ×2 IMPLANT
ELECT BLADE 4.0 EZ CLEAN MEGAD (MISCELLANEOUS) ×2
ELECT CAUTERY BLADE 6.4 (BLADE) ×2 IMPLANT
ELECT REM PT RETURN 9FT ADLT (ELECTROSURGICAL) ×2
ELECTRODE BLDE 4.0 EZ CLN MEGD (MISCELLANEOUS) ×1 IMPLANT
ELECTRODE REM PT RTRN 9FT ADLT (ELECTROSURGICAL) ×1 IMPLANT
EVACUATOR 1/8 PVC DRAIN (DRAIN) ×2 IMPLANT
GLOVE BIOGEL PI IND STRL 7.5 (GLOVE) ×2 IMPLANT
GLOVE BIOGEL PI IND STRL 8 (GLOVE) ×1 IMPLANT
GLOVE BIOGEL PI INDICATOR 7.5 (GLOVE) ×2
GLOVE BIOGEL PI INDICATOR 8 (GLOVE) ×1
GLOVE ECLIPSE 7.0 STRL STRAW (GLOVE) ×4 IMPLANT
GLOVE ORTHO TXT STRL SZ7.5 (GLOVE) ×2 IMPLANT
GOWN STRL REUS W/ TWL LRG LVL3 (GOWN DISPOSABLE) ×3 IMPLANT
GOWN STRL REUS W/TWL LRG LVL3 (GOWN DISPOSABLE) ×3
HEMOSTAT SURGICEL 2X14 (HEMOSTASIS) IMPLANT
KIT BASIN OR (CUSTOM PROCEDURE TRAY) ×2 IMPLANT
KIT ROOM TURNOVER OR (KITS) ×2 IMPLANT
MANIFOLD NEPTUNE II (INSTRUMENTS) ×2 IMPLANT
NDL SUT .5 MAYO 1.404X.05X (NEEDLE) ×1 IMPLANT
NEEDLE MAYO TAPER (NEEDLE) ×1
NEEDLE SPNL 18GX3.5 QUINCKE PK (NEEDLE) ×6 IMPLANT
NS IRRIG 1000ML POUR BTL (IV SOLUTION) ×2 IMPLANT
PACK LAMINECTOMY ORTHO (CUSTOM PROCEDURE TRAY) ×2 IMPLANT
PAD ARMBOARD 7.5X6 YLW CONV (MISCELLANEOUS) ×4 IMPLANT
PATTIES SURGICAL .5 X.5 (GAUZE/BANDAGES/DRESSINGS) ×4 IMPLANT
PATTIES SURGICAL .75X.75 (GAUZE/BANDAGES/DRESSINGS) IMPLANT
ROD DEGEN MATRIX 55MM LUMBAR (Rod) ×2 IMPLANT
ROD DEGEN MATRIX 60MM LUMBAR (Rod) ×2 IMPLANT
SCREW MATRIX MIS 6.0X35MM (Screw) ×2 IMPLANT
SCREW MATRIX MIS 6.0X40MM (Screw) ×6 IMPLANT
SCREW MATRIX MIS 6.0X45MM (Screw) ×4 IMPLANT
SPACER CONCORDE CUR 5DEG L10MM (Orthopedic Implant) ×2 IMPLANT
SPONGE LAP 18X18 X RAY DECT (DISPOSABLE) IMPLANT
SPONGE LAP 4X18 X RAY DECT (DISPOSABLE) ×8 IMPLANT
SPONGE SURGIFOAM ABS GEL 100 (HEMOSTASIS) IMPLANT
STAPLER VISISTAT 35W (STAPLE) IMPLANT
SUT BONE WAX W31G (SUTURE) ×2 IMPLANT
SUT VIC AB 2-0 CT1 27 (SUTURE) ×1
SUT VIC AB 2-0 CT1 TAPERPNT 27 (SUTURE) ×1 IMPLANT
SUT VIC AB 3-0 X1 27 (SUTURE) ×2 IMPLANT
SUT VICRYL 0 TIES 12 18 (SUTURE) ×2 IMPLANT
SUT VICRYL 4-0 PS2 18IN ABS (SUTURE) IMPLANT
SUT VICRYL AB 2 0 TIES (SUTURE) ×2 IMPLANT
TOWEL OR 17X24 6PK STRL BLUE (TOWEL DISPOSABLE) ×2 IMPLANT
TOWEL OR 17X26 10 PK STRL BLUE (TOWEL DISPOSABLE) ×2 IMPLANT
TRAY FOLEY CATH 16FRSI W/METER (SET/KITS/TRAYS/PACK) ×2 IMPLANT
WATER STERILE IRR 1000ML POUR (IV SOLUTION) ×2 IMPLANT
YANKAUER SUCT BULB TIP NO VENT (SUCTIONS) ×2 IMPLANT

## 2014-07-03 NOTE — Brief Op Note (Signed)
07/03/2014  1:12 PM  PATIENT:  Kara [name]  59 y.o. 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 Raymundo Rout PA-C ANESTHESIA:   general  EBL:  Total I/O In: 3280 [I.V.:3100; Blood:180] Out: 1025 [Urine:425; Blood:600]    DRAINS: none   LOCAL MEDICATIONS USED: exparel/marcaine  SPECIMEN:  No Specimen  DISPOSITION OF SPECIMEN:  N/A  COUNTS:  YES  TOURNIQUET:  * No tourniquets in log *   PATIENT DISPOSITION:  PACU - hemodynamically stable.

## 2014-07-03 NOTE — Progress Notes (Signed)
Percocet 10/325 offered for pain and declined by patient.

## 2014-07-03 NOTE — Interval H&P Note (Signed)
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 After consideration of risks, benefits and other options for treatment, the patient has consented to  Procedure(s): Removal Affix Spinous Process Plate (NuVasive) L4-5, L5 Gill Procedure, Left L5-S1 TLIF (Depuy), L4-S1 Pedicle Instrumentation (Depuy), L4-S1 Lateral Fusion (N/A) as a surgical intervention .  The patient's history has been reviewed, patient examined, no change in status, stable for surgery.  I have reviewed the patient's chart and labs.  Questions were answered to the patient's satisfaction.     YATES,MARK C

## 2014-07-03 NOTE — Progress Notes (Signed)
Patient very teary and emotional, Tabatha CRNA at bedside, talking to pt. Order for Versed received from Dr. Okey Dupre.

## 2014-07-03 NOTE — Progress Notes (Signed)
Orthopedic Tech Progress Note Patient Details:  Kara [name] [birth year] [id number]  Patient ID: [name], female   DOB: [birth year], 59 y.o.   MRN: [id number] Called in bio-tech brace order; spoke with Anderson Malta, Ronelle Smallman 07/03/2014, 4:32 PM

## 2014-07-03 NOTE — Progress Notes (Signed)
Orthopedic Tech Progress Note Patient Details:  Kara [name] 1955-11-10 [id number] Brace order completed by bio-tech vendor. Patient ID: Taquanna Borras, female   DOB: [birth year], 59 y.o.   MRN: [id number]   Jennye Moccasin 07/03/2014, 6:05 PM

## 2014-07-03 NOTE — Transfer of Care (Signed)
Immediate Anesthesia Transfer of Care Note  Patient: Kara [name]  Procedure(s) Performed: Procedure(s): Removal Affix Spinous Process Plate (NuVasive) L4-5, L5 Gill Procedure, Left L5-S1 TLIF (Depuy), L4-S1 Pedicle Instrumentation (Depuy), L4-S1 Lateral Fusion (N/A)  Patient Location: PACU  Anesthesia Type:General  Level of Consciousness: awake, alert , oriented and patient cooperative  Airway & Oxygen Therapy: Patient Spontanous Breathing  Post-op Assessment: Report given to RN and Post -op Vital signs reviewed and stable  Post vital signs: Reviewed and stable  Last Vitals:  Filed Vitals:   07/03/14 0631  BP: 160/97  Pulse: 84  Temp: 37.2 C  Resp: 16    Complications: No apparent anesthesia complications

## 2014-07-03 NOTE — Anesthesia Preprocedure Evaluation (Addendum)
Anesthesia Evaluation  Patient identified by MRN, date of birth, ID band Patient awake    Reviewed: Allergy & Precautions, NPO status , Patient's Chart, lab work & pertinent test results  Airway Mallampati: II  TM Distance: >3 FB Neck ROM: Full  Mouth opening: Limited Mouth Opening  Dental no notable dental hx. (+) Teeth Intact, Dental Advisory Given   Pulmonary neg pulmonary ROS, former smoker,  breath sounds clear to auscultation  Pulmonary exam normal       Cardiovascular negative cardio ROS  Rhythm:Regular Rate:Normal     Neuro/Psych Anxiety Depression negative neurological ROS     GI/Hepatic negative GI ROS, Neg liver ROS,   Endo/Other  negative endocrine ROS  Renal/GU negative Renal ROS  negative genitourinary   Musculoskeletal negative musculoskeletal ROS (+)   Abdominal   Peds negative pediatric ROS (+)  Hematology negative hematology ROS (+)   Anesthesia Other Findings   Reproductive/Obstetrics negative OB ROS                            Anesthesia Physical Anesthesia Plan  ASA: II  Anesthesia Plan: General   Post-op Pain Management:    Induction: Intravenous  Airway Management Planned: Oral ETT  Additional Equipment:   Intra-op Plan:   Post-operative Plan: Extubation in OR  Informed Consent: I have reviewed the patients History and Physical, chart, labs and discussed the procedure including the risks, benefits and alternatives for the proposed anesthesia with the patient or authorized representative who has indicated his/her understanding and acceptance.   Dental advisory given  Plan Discussed with: CRNA and Surgeon  Anesthesia Plan Comments:         Anesthesia Quick Evaluation

## 2014-07-04 LAB — BASIC METABOLIC PANEL
ANION GAP: 7 (ref 5–15)
BUN: 10 mg/dL (ref 6–23)
CO2: 27 mmol/L (ref 19–32)
CREATININE: 0.76 mg/dL (ref 0.50–1.10)
Calcium: 8.5 mg/dL (ref 8.4–10.5)
Chloride: 103 mmol/L (ref 96–112)
GFR calc Af Amer: >90 mL/min (ref >90)
GFR calc non Af Amer: >90 mL/min (ref >90)
Glucose, Bld: 103 mg/dL — ABNORMAL HIGH (ref 70–99)
Potassium: 3.7 mmol/L (ref 3.5–5.1)
SODIUM: 137 mmol/L (ref 135–145)

## 2014-07-04 LAB — CBC
HCT: 27.9 % — ABNORMAL LOW (ref 36.0–46.0)
Hemoglobin: 9.2 g/dL — ABNORMAL LOW (ref 12.0–15.0)
MCH: 28 pg (ref 26.0–34.0)
MCHC: 33 g/dL (ref 30.0–36.0)
MCV: 84.8 fL (ref 78.0–100.0)
Platelets: 239 10*3/uL (ref 150–400)
RBC: 3.29 MIL/uL — ABNORMAL LOW (ref 3.87–5.11)
RDW: 13.3 % (ref 11.5–15.5)
WBC: 9 10*3/uL (ref 4.0–10.5)

## 2014-07-04 MED ORDER — HYDROMORPHONE HCL 1 MG/ML IJ SOLN
2.0000 mg | INTRAMUSCULAR | Status: DC | PRN
  Administered 2014-07-04 (×5): 2 mg via INTRAVENOUS
  Administered 2014-07-05: 1 mg via INTRAVENOUS
  Administered 2014-07-05 (×5): 2 mg via INTRAVENOUS
  Administered 2014-07-05: 1 mg via INTRAVENOUS
  Administered 2014-07-05 – 2014-07-06 (×4): 2 mg via INTRAVENOUS
  Filled 2014-07-04 (×15): qty 2

## 2014-07-04 MED ORDER — OXYCODONE-ACETAMINOPHEN 5-325 MG PO TABS
1.0000 | ORAL_TABLET | ORAL | Status: DC | PRN
  Administered 2014-07-04 – 2014-07-06 (×12): 1 via ORAL
  Filled 2014-07-04 (×12): qty 1

## 2014-07-04 MED ORDER — OXYCODONE HCL 5 MG PO TABS
10.0000 mg | ORAL_TABLET | ORAL | Status: DC | PRN
  Administered 2014-07-04 – 2014-07-06 (×12): 10 mg via ORAL
  Filled 2014-07-04 (×12): qty 2

## 2014-07-04 NOTE — Evaluation (Signed)
Physical Therapy Evaluation Patient Details Name: Kara [name] MRN: [id number] DOB: 05-Mar-1956 Today's Date: 07/04/2014   History of Present Illness  59 y.o. s/p Removal Affix Spinous Process Plate (NuVasive) L4-5, L5 Gill Procedure, Left L5-S1 TLIF (Depuy), L4-S1 Pedicle Instrumentation (Depuy), L4-S1 Lateral Fusion. She had prior back surgery 5 years ago. PMH also includes fibromyalgia and chronic pain.  Clinical Impression  Patient limited by pain during therapy session today although she was premedicated.  Patient with difficulty finding comfortable position in sitting and supine.  She may benefit from further skilled PT on this venue of care to assist with discharge planning and mobility progression.    Follow Up Recommendations Home health PT;Supervision/Assistance - 24 hour    Equipment Recommendations  3in1 (PT)    Recommendations for Other Services       Precautions / Restrictions Precautions Precautions: Back Precaution Booklet Issued: Yes (comment) Precaution Comments: educated on back precautions and brace wear Required Braces or Orthoses: Spinal Brace Spinal Brace: Lumbar corset;Applied in sitting position;Other (comment) Spinal Brace Comments: husband assisted in donning brace with min cues for techniqu Restrictions Weight Bearing Restrictions: No Other Position/Activity Restrictions: patient often sleeps with 6 pillows      Mobility  Bed Mobility Overal bed mobility: Needs Assistance Bed Mobility: Sit to Sidelying Rolling: Supervision Sidelying to sit: Supervision     Sit to sidelying: Supervision (with use of rail) General bed mobility comments: min cues for sequencing for back precautions  Transfers Overall transfer level: Needs assistance Equipment used: Rolling walker (2 wheeled) Transfers: Sit to/from Stand Sit to Stand: Min guard         General transfer comment: cues for hand placement. (from recliner)  Ambulation/Gait Ambulation/Gait  assistance: Supervision Ambulation Distance (Feet): 100 Feet Assistive device: Rolling walker (2 wheeled) Gait Pattern/deviations: Trunk flexed;Shuffle     General Gait Details: cues for upright posture and to reduce guarding  Stairs            Wheelchair Mobility    Modified Rankin (Stroke Patients Only)       Balance Overall balance assessment: Needs assistance Sitting-balance support: No upper extremity supported Sitting balance-Leahy Scale: Fair     Standing balance support: Bilateral upper extremity supported Standing balance-Leahy Scale: Poor                               Pertinent Vitals/Pain Pain Assessment: 0-10 Pain Score: 10-Worst pain ever Pain Location: back, hips, Rt leg Pain Descriptors / Indicators: Aching;Sore;Constant ("15" on a 10-pt scale) Pain Intervention(s): Limited activity within patient's tolerance;Monitored during session;Premedicated before session;Repositioned (offered ice pack at end of session)    Home Living Family/patient expects to be discharged to:: Private residence Living Arrangements: Spouse/significant other Available Help at Discharge: Family;Other (Comment) (husband works from home but unable to provide PM assist) Type of Home: House Home Access: Level entry Entrance Stairs-Rails: None Entrance Stairs-Number of Steps: 4 or 2 Home Layout: One level Home Equipment: Walker - 2 wheels;Other (comment) Librarian, academic)      Prior Function Level of Independence: Independent         Comments: without device but "with a limp" per patient     Hand Dominance   Dominant Hand: Right    Extremity/Trunk Assessment   Upper Extremity Assessment: Overall WFL for tasks assessed           Lower Extremity Assessment: Overall WFL for tasks assessed  Cervical / Trunk Assessment: Normal  Communication   Communication: No difficulties  Cognition Arousal/Alertness: Awake/alert Behavior During Therapy:  WFL for tasks assessed/performed Overall Cognitive Status: Within Functional Limits for tasks assessed                      General Comments      Exercises        Assessment/Plan    PT Assessment Patient needs continued PT services  PT Diagnosis Difficulty walking;Abnormality of gait;Acute pain   PT Problem List Decreased strength;Decreased activity tolerance;Decreased balance;Decreased mobility;Decreased knowledge of use of DME;Decreased knowledge of precautions;Pain  PT Treatment Interventions DME instruction;Gait training;Functional mobility training;Therapeutic activities;Therapeutic exercise;Balance training;Neuromuscular re-education;Patient/family education   PT Goals (Current goals can be found in the Care Plan section) Acute Rehab PT Goals Patient Stated Goal: manage pain PT Goal Formulation: With patient/family Time For Goal Achievement: 07/11/14 Potential to Achieve Goals: Good    Frequency Min 5X/week   Barriers to discharge Decreased caregiver support husband not able to provide assistance during night hours    Co-evaluation               End of Session Equipment Utilized During Treatment: Back brace;Gait belt Activity Tolerance: Patient limited by pain Patient left: in bed;with call bell/phone within reach;with family/visitor present Nurse Communication: Mobility status         Time: 4332-9518 PT Time Calculation (min) (ACUTE ONLY): 45 min   Charges:   PT Evaluation $Initial PT Evaluation Tier I: 1 Procedure PT Treatments $Gait Training: 8-22 mins $Therapeutic Activity: 8-22 mins   PT G CodesNestor Lewandowsky, [place] [phone] Aara Jacquot 07/04/2014, 4:40 PM

## 2014-07-04 NOTE — Op Note (Signed)
NAMETEKESHA, ALMGREN NO.:  [id number]  MEDICAL RECORD NO.:  [id number]  LOCATION:  5N09C                        FACILITY:  [facility]  PHYSICIAN:  Tabita Corbo C. Ophelia Charter, M.D.    DATE OF BIRTH:  [birth year]  DATE OF PROCEDURE:  07/03/2014 DATE OF DISCHARGE:                              OPERATIVE REPORT   POSTOPERATIVE DIAGNOSES: 1. Pseudoarthrosis at L4-L5 with cage subsidence.Loose Affix spinous process plate P3-7 2. Unstable L5-S1 degenerative spondylolisthesis with biforaminal     stenosis and lateral recess stenosis.  PROCEDURE:  Removal of Affix spinous process, loose plate T0-2.  L5 Gill procedure.  L4-L5 decompression (L4, L5, and partial S1 laminectomy). The L4 to S1 pedicle instrumentation.  Left L5-S1 transforaminal lumbar interbody fusion.  Gill procedure at L5 (removal of unstable posterior element).  Interbody fusion with local bone and bilateral lateral gutter fusion, L4 to S1. ( L4-5 is a re-do level, L5-S1 first time surgery)  SURGEON:  Annell Greening MD.  ASSISTANT:  Zonia Kief, PA-C, medically necessary and present for the entire procedure.  EBL:  600 with re-transfusion, Cell Saver 200 mL.  IMPLANTS:  Matrix screws at L4, 6 x 45 mm and at L5, 6 x 40 on the left, 6 x 35 on the right.  S1, 6 x 40, right and left.  Concorde 10 mm curved cage, L5-S1.  55 mm rod on the right, 60 mm on the left MATRIX Synthes.  DESCRIPTION OF PROCEDURE:  After induction of general anesthesia, the patient was placed prone on the spine frame.  Careful padding and positioning.  Foley catheter was inserted before she was flipped.  Back was prepped with DuraPrep.  Allowed the dry time-out procedure. Completed 2 g Ancef given prophylactically.  The area was squared with towels,  Betadine, and Steri-Drape after the old incision was marked with a skin marker.  The patient had previous XLIF done at L4-L5 and had loose spinous process AFFIX plate.  Myelogram CT scan showed  flexion, extension, and stability with shifting 5-6 mm at the L5-S1 level with severe biforaminal stenosis, worse on the left than right.  Laminectomy sheets, drapes, and Betadine Steri-Drape were applied.  Midline incision was made.  Subperiosteal dissection out onto the lamina.  The plate was loose.  Using a wrench, it was removed as well as Cobb.  Spinous process was harvested L4, L5, and S1.  Lamina was removed performing laminectomy, removing all of L5 out to the facet joint, and the left facet joint was sacrificed following the disk out and C-arm spot pictures taken x2 confirming we were at the L5-S1 level.  Nerve root from L5 was crushed as it exited out from underneath the pedicle. Due to the spondylolisthesis and prone position, there was near complete reduction.  Facet was removed.  Nerve root was free.  Small veins were coagulated.  Diskectomy was performed using straight curettes, angled curettes, angled pituitaries, rasps, and ring curettes.  Anterior annulus was intact and then trial sizing up to a 9,  which gave nice tight fit.  A 10 was inserted.  Cage packed with bone after all the decorticated facet joint on the left at L5-S1  and also the laminectomy that was done at L5 Gill procedure, L4 laminectomy for meticulously cleaned soft tissue, and rongeured into small pieces.  Bone was packed anterior to the cage, cage was inserted, checked under fluoro, and then kicked across until it was midline and countersunk several millimeters. It was extremely stable and could not advance any more despite using the hammer.  After fixation of the cage, motion was noted at 4-5 interspace consistent with pseudoarthrosis.  Pedicle screws were placed at S1, L5, and L4, all on the left side first with sequencing of using all, followed by the joystick, checking under fluoroscopy, filling with the ball-tip probe, tapping, and filling the ball-tip probe again checking under fluoro positioning,  decortication of the transverse process or sacral ala and then placement of the screw based on depth gauge measurement.  Once all screws on left side were done, screws were placed on the right.  Top screw went medially adjacent to the endplate, started a little bit low but went through the pedicle but came just up to the endplate, and it was decided to loosen the rod screws and that then was redirected slightly inferior just to make sure.  The threads __________ but was certainly within a millimeter and with redirection, was in good position.  Final spot pictures were taken for confirmation, and C-arm was angled to look down the pedicles.  The middle screw on the left was a little bit close but that was the side where the nerve root was completely visualized, medial wall of the pedicle was visualized all the way to the floor.  There was no violation, and the nerve root was well decompressed at L5 on the left.  Repeat irrigation.  Rod was compressed first on the TLIF side and then followed to the right side.  At the 4-5 level, compression was performed both right and left with the pseudarthrosis motion.  Additional bone was then packed out lateral and the decorticated transverse processes, both right and left.  Central canal and dura were checked to make sure there were no pieces of bone left.  Copious irrigation and rechecking the dura to make sure both gutters looked good, no bone __________ cage, and then Exparel infiltration after closure of the fascia, 2-0 Vicryl in subcutaneous tissue, subcuticular skin closure, postop dressing, and transferred to recovery room.  Instrument count and needle count were correct.     Jasson Siegmann C. Ophelia Charter, M.D.     MCY/MEDQ  D:  07/03/2014  T:  07/04/2014  Job:  [id number]

## 2014-07-04 NOTE — Evaluation (Addendum)
Occupational Therapy Evaluation Patient Details Name: Kara [name] MRN: [id number] DOB: May 29, 1955 Today's Date: 07/04/2014    History of Present Illness 59 y.o. s/p Removal Affix Spinous Process Plate (NuVasive) L4-5, L5 Gill Procedure, Left L5-S1 TLIF (Depuy), L4-S1 Pedicle Instrumentation (Depuy), L4-S1 Lateral Fusion.   Clinical Impression   Pt s/p above. Pt independent with ADLs, PTA. Feel pt will benefit from acute OT to increase independence and activity tolerance prior to d/c. Would benefit from having spouse present for next session to be sure he does not have any questions.   Follow Up Recommendations  No OT follow up;Supervision - Intermittent    Equipment Recommendations  None recommended by OT    Recommendations for Other Services       Precautions / Restrictions Precautions Precautions: Back Precaution Booklet Issued: No Precaution Comments: educated on back precautions Required Braces or Orthoses: Spinal Brace Spinal Brace: Lumbar corset;Applied in sitting position Restrictions Weight Bearing Restrictions: No      Mobility Bed Mobility Overal bed mobility: Needs Assistance Bed Mobility: Rolling;Sidelying to Sit;Sit to Sidelying Rolling: Supervision Sidelying to sit: Supervision     Sit to sidelying: Supervision    Transfers Overall transfer level: Needs assistance   Transfers: Sit to/from Stand Sit to Stand: Min guard         General transfer comment: cues for hand placement.    Balance  Min guard for ambulation with RW.                                          ADL Overall ADL's : Needs assistance/impaired     Grooming: Wash/dry hands;Set up;Supervision/safety;Standing               Lower Body Dressing: Minimal assistance;Sit to/from stand   Toilet Transfer: Min guard;Ambulation;RW;Comfort height toilet   Toileting- Clothing Manipulation and Hygiene: Supervision/safety;Sit to/from stand       Functional  mobility during ADLs: Min guard;Rolling walker General ADL Comments: Cues for precautions in session. Pt able to cross legs to don socks, but cues for precautions. Educated on AE. Educated on back brace and precautions. Educated on use of cup for oral care and placement of grooming items to avoid breaking precautions.  Pt planning on letting spouse assist with LB dressing and back brace.      Vision     Perception     Praxis      Pertinent Vitals/Pain Pain Assessment: 0-10 Pain Score:  (15) Pain Location: back Pain Intervention(s): Repositioned;Monitored during session     Hand Dominance Right   Extremity/Trunk Assessment Upper Extremity Assessment Upper Extremity Assessment: Overall WFL for tasks assessed   Lower Extremity Assessment Lower Extremity Assessment: Defer to PT evaluation       Communication Communication Communication: No difficulties   Cognition Arousal/Alertness: Awake/alert Behavior During Therapy: WFL for tasks assessed/performed Overall Cognitive Status: Within Functional Limits for tasks assessed                     General Comments       Exercises       Shoulder Instructions      Home Living Family/patient expects to be discharged to:: Private residence Living Arrangements: Spouse/significant other Available Help at Discharge: Family;Other (Comment) (states spouse can't assist at night) Type of Home: House Home Access: Stairs to enter [employer] of Steps: 4 or 2 Entrance Stairs-Rails:  None       Bathroom Shower/Tub: Chief Strategy Officer: Handicapped height     Home Equipment: Shower seat          Prior Functioning/Environment Level of Independence: Independent             OT Diagnosis: Acute pain   OT Problem List: Decreased knowledge of precautions;Decreased knowledge of use of DME or AE;Decreased activity tolerance;Pain   OT Treatment/Interventions: Self-care/ADL training;DME and/or  AE instruction;Therapeutic activities;Patient/family education;Balance training    OT Goals(Current goals can be found in the care plan section) Acute Rehab OT Goals Patient Stated Goal: not stated OT Goal Formulation: With patient Time For Goal Achievement: 07/11/14 Potential to Achieve Goals: Good ADL Goals Pt Will Perform Grooming: standing;with modified independence Pt Will Transfer to Toilet: with modified independence;ambulating (elevated toilet) Pt Will Perform Toileting - Clothing Manipulation and hygiene: with modified independence;sit to/from stand Pt Will Perform Tub/Shower Transfer: Tub transfer;with supervision;ambulating;shower seat Additional ADL Goal #1: Pt will independently verbalize 3/3 back precautions and maintain during ADLs/functional activities.   OT Frequency: Min 2X/week   Barriers to D/C:            Co-evaluation              End of Session Equipment Utilized During Treatment: Gait belt;Rolling walker;Back brace  Activity Tolerance: Patient limited by pain Patient left: in bed;with call bell/phone within reach  Nurse communication: OT worked with pt   Time: 1421-1440 OT Time Calculation (min): 19 min Charges:  OT General Charges $OT Visit: 1 Procedure OT Evaluation $Initial OT Evaluation Tier I: 1 Procedure G-CodesEarlie Raveling OTR/L [id number] 07/04/2014, 2:56 PM

## 2014-07-04 NOTE — Progress Notes (Signed)
Patient ID: Kara [name], female   DOB: 1955-04-20, 59 y.o.   MRN: [id number] Patient is crying this morning stating that her pain medication is not as high as she normally takes. I discussed her case with Dr. Ophelia Charter and her oxycodone and Dilaudid doses were increased.

## 2014-07-05 MED ORDER — FLEET ENEMA 7-19 GM/118ML RE ENEM
1.0000 | ENEMA | Freq: Every day | RECTAL | Status: DC | PRN
  Administered 2014-07-05: 1 via RECTAL
  Filled 2014-07-05: qty 1

## 2014-07-05 NOTE — Progress Notes (Signed)
Patient ID: Kara [name], female   DOB: 11-03-1955, 59 y.o.   MRN: [id number] Patient still complains of pain but she states she is better than yesterday. She has been up and ambulating. We'll evaluate for discharge tomorrow.

## 2014-07-05 NOTE — Progress Notes (Signed)
CSW received referral for questionable SNF.   Chart reviewed. PT recommended HHPT  Will advise RN Case Manager for assessment of home health and DME needs.   CSW will sign off. Please re-consult is CSW needs arise.    Kaylani Fromme LCSWA  [facility]  2S, 2M, 5N, 6N, 6E, PEDS/PICU Phone:[phone]   

## 2014-07-05 NOTE — Progress Notes (Signed)
Clinical Social Work Department CLINICAL SOCIAL WORK PLACEMENT NOTE 07/05/2014  Patient:  Kara [name], Kara [name]  Account Number:  [id number] Admit date:  07/03/2014  Clinical Social Worker:  Leron Croak, CLINICAL SOCIAL WORKER  Date/time:  07/05/2014 01:46 PM  Clinical Social Work is seeking post-discharge placement for this patient at the following level of care:   SKILLED NURSING   (*CSW will update this form in Epic as items are completed)   07/05/2014  Patient/family provided with [facility] Department of Clinical Social Work's list of facilities offering this level of care within the geographic area requested by the patient (or if unable, by the patient's family).  07/05/2014  Patient/family informed of their freedom to choose among providers that offer the needed level of care, that participate in Medicare, Medicaid or managed care program needed by the patient, have an available bed and are willing to accept the patient.  07/05/2014  Patient/family informed of [facility]' ownership interest in [facility], as well as of the fact that they are under no obligation to receive care at this facility.  PASARR submitted to EDS on  PASARR number received on   FL2 transmitted to all facilities in geographic area requested by pt/family on  07/05/2014 FL2 transmitted to all facilities within larger geographic area on 07/05/2014  Patient informed that his/her managed care company has contracts with or will negotiate with  certain facilities, including the following:     Patient/family informed of bed offers received:   Patient chooses bed at  Physician recommends and patient chooses bed at    Patient to be transferred to  on   Patient to be transferred to facility by  Patient and family notified of transfer on  Name of family member notified:    The following physician request were entered in Epic:   Additional Comments:  Monice Lundy Janelle Floor  [facility]  2S,  75M, 5N, 6N, 6E, PEDS/PICU [phone]

## 2014-07-05 NOTE — Progress Notes (Signed)
Physical Therapy Treatment Patient Details Name: Kara [name] MRN: [id number] DOB: June 21, 1955 Today's Date: 07/05/2014    History of Present Illness 59 y.o. s/p Removal Affix Spinous Process Plate (NuVasive) L4-5, L5 Gill Procedure, Left L5-S1 TLIF (Depuy), L4-S1 Pedicle Instrumentation (Depuy), L4-S1 Lateral Fusion. She had prior back surgery 5 years ago. PMH also includes fibromyalgia and chronic pain.    PT Comments    Returned to assess pain control following use of modalities, pt receiving next dose of pain meds, ready to get OOB.  Overall requires close guarding to supervision for mobility, is slow and antalgic;  Pt unable to tolerate sitting 10 minutes.  Spouse attentive but cannot be at home 24/7.  If pain control achieved then likely can manage at home; however without adequate pain control may continue to need 24 hour skilled care.    Per CSW agreeable to faxing out for bed search.  PT continues efforts; ice on 30 off 30 for remainder of day.   Follow Up Recommendations  SNF (if pain control achieved)     Equipment Recommendations       Recommendations for Other Services       Precautions / Restrictions Precautions Precautions: Back Required Braces or Orthoses: Spinal Brace Spinal Brace: Lumbar corset;Applied in sitting position;Other (comment) Spinal Brace Comments: husband placed brace, declined assist from PT Restrictions Weight Bearing Restrictions: No    Mobility  Bed Mobility Overal bed mobility: Modified Independent Bed Mobility: Sidelying to Sit   Sidelying to sit: Modified independent (Device/Increase time);HOB elevated       General bed mobility comments: spouse educated to keep bed flat; pt performs without physical assist and self-coaches  Transfers Overall transfer level: Needs assistance Equipment used: Rolling walker (2 wheeled) Transfers: Sit to/from Stand Sit to Stand: Supervision         General transfer comment: spouse braces RW, pt  pushes up on it; spouse cues to sitting to recliner; able to reposition self  Ambulation/Gait Ambulation/Gait assistance: Min guard Ambulation Distance (Feet): 200 Feet Assistive device: Rolling walker (2 wheeled) Gait Pattern/deviations: Step-through pattern;Decreased stride length;Antalgic;Trunk flexed;Narrow base of support Gait velocity: decreased   General Gait Details: cues for posture, proximity to RW, stride length; pt verb acknowledges but does not change technique (instr to walk short bouts several times/day)   Stairs Stairs:  (Pt DOES have 4 steps with rail to enter home.  ?d/c)          Wheelchair Mobility    Modified Rankin (Stroke Patients Only)       Balance   Sitting-balance support: Single extremity supported;Feet supported Sitting balance-Leahy Scale: Fair     Standing balance support: During functional activity;Single extremity supported Standing balance-Leahy Scale: Fair                      Cognition Arousal/Alertness: Lethargic;Suspect due to medications Behavior During Therapy: [facility] for tasks assessed/performed Overall Cognitive Status: Within Functional Limits for tasks assessed                      Exercises      General Comments General comments (skin integrity, edema, etc.): long discussion with patient regarding pain intensity, medications, and role of PT.  See clinical impression statement for more.  Provided ice packs along spine and instruction to remove after 20-30 minutes, reapply after at least 30 mintues.  Pt tearful, scared      Pertinent Vitals/Pain Pain Assessment: 0-10 Pain Score: 9  Pain Location: back, radiates to hips Pain Descriptors / Indicators: Constant;Radiating Pain Intervention(s): Limited activity within patient's tolerance;Monitored during session;Premedicated before session;Repositioned (instr to continue 30/30 ice application)    Home Living                      Prior Function             PT Goals (current goals can now be found in the care plan section) Acute Rehab PT Goals Patient Stated Goal: no pain Progress towards PT goals: Progressing toward goals    Frequency  Min 5X/week    PT Plan Discharge plan needs to be updated    Co-evaluation             End of Session Equipment Utilized During Treatment: Back brace;Gait belt Activity Tolerance: Patient tolerated treatment well Patient left: in chair;with call bell/phone within reach;with family/visitor present     Time: 1101-1136 PT Time Calculation (min) (ACUTE ONLY): 35 min  Charges:  $Gait Training: 8-22 mins $Therapeutic Activity: 8-22 mins $Self Care/Home Management: 8-22                    G Codes:      Dennis Bast 07/05/2014, 11:43 AM

## 2014-07-05 NOTE — Progress Notes (Signed)
Physical Therapy Treatment Patient Details Name: Kara [name] MRN: [id number] DOB: 14-May-1955 Today's Date: 07/05/2014    History of Present Illness 59 y.o. s/p Removal Affix Spinous Process Plate (NuVasive) L4-5, L5 Gill Procedure, Left L5-S1 TLIF (Depuy), L4-S1 Pedicle Instrumentation (Depuy), L4-S1 Lateral Fusion. She had prior back surgery 5 years ago. PMH also includes fibromyalgia and chronic pain.    PT Comments    Pt continues to be severely limited by 10+/10 pain per report.  Discussed role of PT in acute pain management, benefits of mobility and use of modalities to assist with pain management.  Pt verbally acknowledges moving helps her feel better but reports pain is so severe she 'cannot' until she 'gets a break in the pain'.  She is tearful and afraid she will have to live in pain.  MD, please educate pt on medication plan.    Recommend:  1) reconsider SNF as pt cannot access home or perform adequate mobility to safely recover in that setting.  SNF may in fact be necessary until pain is better managed. 2) continue to provide pain medication as scheduled with goal of bringing pain level down to manageable rating 3) compliment pain medication regimen with ice along back, applied for 20-30 minutes, removed for 30 minutes, repeat throughout day with goal to demonstrate reduced pain 4) rest today, reassess pain control and emphasize mobility as pt begins to experience pain relief.    Discussed plan with spouse and with RN and check in with pt and RN later today to assess progress.     Follow Up Recommendations  SNF (unless pain is controlled, pt cannot go home)     Equipment Recommendations       Recommendations for Other Services       Precautions / Restrictions Precautions Precautions: Back    Mobility  Bed Mobility Overal bed mobility:  (unable/refused d/t pain; up to bathroom before w/PAIN)             General bed mobility comments: pt received sidelying on  RIGHT with multiple pillows for support.  reports constant radiating pain that is worse that preoperative and asking "why did they only give me a little more medicine knowing I had this major operation"  Transfers Overall transfer level:  (unable)               General transfer comment: pt declined offer for OOB>chair  Ambulation/Gait                 Stairs Stairs:  (Pt DOES have 4 steps with rail to enter home.  ?d/c)          Wheelchair Mobility    Modified Rankin (Stroke Patients Only)       Balance                                    Cognition Arousal/Alertness: Awake/alert Behavior During Therapy:  (tearful) Overall Cognitive Status: Within Functional Limits for tasks assessed                      Exercises      General Comments General comments (skin integrity, edema, etc.): long discussion with patient regarding pain intensity, medications, and role of PT.  See clinical impression statement for more.  Provided ice packs along spine and instruction to remove after 20-30 minutes, reapply after at least 30 mintues.  Pt tearful, scared  Pertinent Vitals/Pain Pain Assessment: 0-10 Pain Score: 10-Worst pain ever Pain Location: back, radiates to hips Pain Descriptors / Indicators: Constant;Radiating Pain Intervention(s): Limited activity within patient's tolerance;Premedicated before session;Ice applied    Home Living                      Prior Function            PT Goals (current goals can now be found in the care plan section) Acute Rehab PT Goals Patient Stated Goal: no pain Progress towards PT goals: Not progressing toward goals - comment (pain limiting participation/benefit from mobility retraining)    Frequency       PT Plan Discharge plan needs to be updated    Co-evaluation             End of Session   Activity Tolerance: Patient limited by pain Patient left: in bed;with call bell/phone  within reach     Time: 0910-0930 PT Time Calculation (min) (ACUTE ONLY): 20 min  Charges:  $Self Care/Home Management: 8-22                    G Codes:      Dennis Bast 07/05/2014, 9:40 AM

## 2014-07-05 NOTE — Progress Notes (Signed)
Clinical Social Work Department BRIEF PSYCHOSOCIAL ASSESSMENT 07/05/2014  Patient:  Kara [name], Kara [name]     Account Number:  [id number]     Admit date:  07/03/2014  Clinical Social Worker:  Pete Pelt, [place]  Date/Time:  07/05/2014 01:37 PM  Referred by:  Physician  Date Referred:  07/05/2014 Referred for  SNF Placement   Other Referral:   Interview type:  Patient Other interview type:    PSYCHOSOCIAL DATA Living Status:  HUSBAND Admitted from facility:   Level of care:   Primary support name:  Philicia Heyne  [phone] Primary support relationship to patient:  SPOUSE Degree of support available:   Pt stated that she has good supprt at home from her husband and that he works from home. Pt also stated that she has insurance that will provided a RN for up to 16 hours a day.    CURRENT CONCERNS Current Concerns  Post-Acute Placement   Other Concerns:    SOCIAL WORK ASSESSMENT / PLAN CSW received consult for SNF placement. CSW met with Pt at the bedside. CSW introduced self and reason for assessment. Pt was aware of the reason for assessment. Pt stated that she is aware that she has been recommended for SNF placement, however she "does not feel it is needed." Pt stated that she has her husband at home that can help with her care and that her insurance would also pay for nursing for her. Pt stated that she feels her best recovery would be for her to go home. Pt was agreeable to allow CSW to fax Pt information to the SNF's in [facility] as a [place] will fax Pt information to [facility] area for SNF placement.   Assessment/plan status:  Information/Referral to [employer] Other assessment/ plan:   Information/referral to community resources:   CSW provided the Pt with a SNF listing in [place] area.    PATIENT'S/FAMILY'S RESPONSE TO PLAN OF CARE: Pt was appreciative for assistance with d/c planning. CSW will continue to follow for d/c plannnig.         [facility]  2S, 35M, 5N, 6N, [place], PEDS/PICU [phone]

## 2014-07-06 LAB — BASIC METABOLIC PANEL
ANION GAP: 7 (ref 5–15)
BUN: <5 mg/dL — ABNORMAL LOW (ref 6–23)
CALCIUM: 7.9 mg/dL — AB (ref 8.4–10.5)
CO2: 27 mmol/L (ref 19–32)
CREATININE: 0.59 mg/dL (ref 0.50–1.10)
Chloride: 102 mmol/L (ref 96–112)
GFR calc Af Amer: >90 mL/min (ref >90)
GFR calc non Af Amer: >90 mL/min (ref >90)
Glucose, Bld: 121 mg/dL — ABNORMAL HIGH (ref 70–99)
Potassium: 4 mmol/L (ref 3.5–5.1)
Sodium: 136 mmol/L (ref 135–145)

## 2014-07-06 LAB — CBC
HEMATOCRIT: 25.5 % — AB (ref 36.0–46.0)
Hemoglobin: 8.4 g/dL — ABNORMAL LOW (ref 12.0–15.0)
MCH: 28.3 pg (ref 26.0–34.0)
MCHC: 32.9 g/dL (ref 30.0–36.0)
MCV: 85.9 fL (ref 78.0–100.0)
Platelets: 238 10*3/uL (ref 150–400)
RBC: 2.97 MIL/uL — ABNORMAL LOW (ref 3.87–5.11)
RDW: 12.9 % (ref 11.5–15.5)
WBC: 8.8 10*3/uL (ref 4.0–10.5)

## 2014-07-06 MED ORDER — MAGNESIUM CITRATE PO SOLN: 1.0000 | Freq: Once | ORAL | Status: DC

## 2014-07-06 MED ORDER — MAGNESIUM CITRATE PO SOLN
0.5000 | Freq: Once | ORAL | Status: AC
  Administered 2014-07-06: 0.5 via ORAL
  Filled 2014-07-06 (×2): qty 296

## 2014-07-06 MED ORDER — SODIUM CHLORIDE 0.45 % IV BOLUS: 500.0000 mL | Freq: Once | INTRAVENOUS | Status: DC

## 2014-07-06 MED ORDER — HYDROMORPHONE HCL 2 MG PO TABS
2.0000 mg | ORAL_TABLET | ORAL | Status: DC | PRN
  Administered 2014-07-06 (×2): 2 mg via ORAL
  Filled 2014-07-06 (×2): qty 1

## 2014-07-06 MED ORDER — OXYCODONE HCL 10 MG PO TABS: ORAL_TABLET | ORAL | Status: DC

## 2014-07-06 MED ORDER — DOCUSATE SODIUM 100 MG PO CAPS: 100.0000 mg | ORAL_CAPSULE | Freq: Two times a day (BID) | ORAL | Status: DC

## 2014-07-06 MED FILL — Thrombin For Soln 20000 Unit: CUTANEOUS | Qty: 1 | Status: AC

## 2014-07-06 NOTE — Progress Notes (Signed)
Subjective: 3 Days Post-Op Procedure(s) (LRB): Removal Affix Spinous Process Plate (NuVasive) L4-5, L5 Gill Procedure, Left L5-S1 TLIF (Depuy), L4-S1 Pedicle Instrumentation (Depuy), L4-S1 Lateral Fusion (N/A) Patient reports pain as moderate.   Earlier yelling at nurses, mad that PA took IV dilaudid away. Fell asleep ,after PO dilaudid.    Objective: Vital signs in last 24 hours: Temp:  [97.6 F (36.4 C)-99.7 F (37.6 C)] 97.6 F (36.4 C) (04/11 1300) Pulse Rate:  [78-92] 87 (04/11 1300) Resp:  [16-18] 16 (04/11 1300) BP: (82-101)/(47-62) 101/62 mmHg (04/11 1300) SpO2:  [95 %-99 %] 99 % (04/11 1300)  Intake/Output from previous day: 04/10 0701 - 04/11 0700 In: 480 [P.O.:480] Out: -  Intake/Output this shift: Total I/O In: 480 [P.O.:480] Out: 2 [Urine:2]   Recent Labs  07/04/14 0648 07/06/14 1300  HGB 9.2* 8.4*    Recent Labs  07/04/14 0648 07/06/14 1300  WBC 9.0 8.8  RBC 3.29* 2.97*  HCT 27.9* 25.5*  PLT 239 238    Recent Labs  07/04/14 0648 07/06/14 1300  NA 137 136  K 3.7 4.0  CL 103 102  CO2 27 27  BUN 10 <5*  CREATININE 0.76 0.59  GLUCOSE 103* 121*  CALCIUM 8.5 7.9*   No results for input(s): LABPT, INR in the last 72 hours.  Neurologically intact  Assessment/Plan: 3 Days Post-Op Procedure(s) (LRB): Removal Affix Spinous Process Plate (NuVasive) L4-5, L5 Gill Procedure, Left L5-S1 TLIF (Depuy), L4-S1 Pedicle Instrumentation (Depuy), L4-S1 Lateral Fusion (N/A) Plan : discharge Home. She wanted Nurse to be with her to get IV pain meds. I told her that is not medically indicated. She states she wants to get off pain meds after post op pain settles down.  She had used up an extra week worth of pain meds when she got surgery. Daughter not at the house now . Long discussion with her about post op pain , medication and her long 7 year Hx of taking large amounts of prescription  Medication.   Kara [name] C 07/06/2014, 2:49 PM

## 2014-07-06 NOTE — Progress Notes (Signed)
Utilization review completed.  

## 2014-07-06 NOTE — Progress Notes (Signed)
Occupational Therapy Treatment Patient Details Name: Kara [name] MRN: [id number] DOB: [birth year] Today's Date: 07/06/2014    History of present illness 59 y.o. s/p Removal Affix Spinous Process Plate (NuVasive) L4-5, L5 Gill Procedure, Left L5-S1 TLIF (Depuy), L4-S1 Pedicle Instrumentation (Depuy), L4-S1 Lateral Fusion. She had prior back surgery 5 years ago. PMH also includes fibromyalgia and chronic pain.   OT comments  Completed education with pt/family regarding back precautions for ADL and functional mobility for ADL. Pt states she only plans to get up when she has to use the bathroom and does not have to use the bathroom now, therefore is not getting up. Completed education with husband. OT signing off. Pt ready for D/C home with husband when medically stable.  Follow Up Recommendations  No OT follow up;Supervision - Intermittent    Equipment Recommendations  3 in 1 bedside comode    Recommendations for Other Services      Precautions / Restrictions Precautions Precautions: Back Precaution Comments: Pt able to verbalize back precautions Required Braces or Orthoses: Spinal Brace Spinal Brace: Lumbar corset;Applied in sitting position Spinal Brace Comments: husband independent with donning/doffing brace Restrictions Weight Bearing Restrictions: No          Balance  Pt declined                                 ADL                                         General ADL Comments: Pt declined to get OOB, therefore, completed educaiton bedside with pt and then with husband also. Discussed safe transfer techniques for toilet and shower. Rec for pt to use walk in shower instead of tub. Also educated on hygiene after toileting and use of available AE. Discussed home safety and reducing risk of falls. Husband verbalized understanding.                                       Cognition   Behavior During Therapy: Flat affect Overall  Cognitive Status: Within Functional Limits for tasks assessed                                                       Pertinent Vitals/ Pain       Pain Assessment: 0-10 Pain Score:  (15) Pain Location: back Pain Descriptors / Indicators: Aching;Constant Pain Intervention(s): Limited activity within patient's tolerance  Home Living                                          Prior Functioning/Environment              Frequency       Progress Toward Goals  OT Goals(current goals can now be found in the care plan section)  Progress towards OT goals: Not progressing toward goals - comment (pt declining to participate)  Acute Rehab OT Goals Patient Stated Goal: no pain OT Goal Formulation: With patient Time  For Goal Achievement: 07/11/14 Potential to Achieve Goals: Good ADL Goals Pt Will Perform Grooming: standing;with modified independence Pt Will Transfer to Toilet: with modified independence;ambulating Pt Will Perform Toileting - Clothing Manipulation and hygiene: with modified independence;sit to/from stand Pt Will Perform Tub/Shower Transfer: Tub transfer;with supervision;ambulating;shower seat Additional ADL Goal #1: Pt will independently verbalize 3/3 back precautions and maintain during ADLs/functional activities.   Plan Discharge plan remains appropriate    Co-evaluation                 End of Session     Activity Tolerance Patient limited by pain (Received pain meds 1 hr prior to session.)   Patient Left in bed;with call bell/phone within reach   Nurse Communication Mobility status        Time: 0354-6568 OT Time Calculation (min): 10 min  Charges: OT General Charges $OT Visit: 1 Procedure OT Treatments $Self Care/Home Management : 8-22 mins  Mavi Un,HILLARY 07/06/2014, 2:21 PM   [facility], OTR/L  [phone] 07/06/2014

## 2014-07-06 NOTE — Progress Notes (Signed)
Physical Therapy Treatment Patient Details Name: Kara [name] MRN: [id number] DOB: [birth year] Today's Date: 07/06/2014    History of Present Illness 59 y.o. s/p Removal Affix Spinous Process Plate (NuVasive) L4-5, L5 Gill Procedure, Left L5-S1 TLIF (Depuy), L4-S1 Pedicle Instrumentation (Depuy), L4-S1 Lateral Fusion. She had prior back surgery 5 years ago. PMH also includes fibromyalgia and chronic pain.    PT Comments    Pt is progressing toward goals, but progress is slow due to pain and pt's lack of interest in therapy. Pt would benefit from further PT, but continues to be self-limiting. Husband tends to enable pt's lack of interest in therapy.  Follow Up Recommendations  SNF     Equipment Recommendations  3in1 (PT)    Recommendations for Other Services       Precautions / Restrictions Precautions Precautions: Back Precaution Comments: Pt independent with back precautions. Required Braces or Orthoses: Spinal Brace Spinal Brace: Lumbar corset;Applied in sitting position Spinal Brace Comments: husband placed brace Restrictions Weight Bearing Restrictions: No    Mobility  Bed Mobility Overal bed mobility: Modified Independent Bed Mobility: Sidelying to Sit   Sidelying to sit: Modified independent (Device/Increase time);HOB elevated     Sit to sidelying: Supervision;HOB elevated General bed mobility comments: Pt continues to raise HOB for sit to sidelying.  Transfers Overall transfer level: Needs assistance Equipment used: Rolling walker (2 wheeled)   Sit to Stand: Supervision         General transfer comment: Supervision for safety.  Ambulation/Gait Ambulation/Gait assistance: Min guard Ambulation Distance (Feet): 80 Feet Assistive device: Rolling walker (2 wheeled) Gait Pattern/deviations: Step-through pattern;Decreased stride length;Antalgic;Trunk flexed;Narrow base of support   Gait velocity interpretation: Below normal speed for age/gender General Gait  Details: Min guard for safety.   Stairs            Wheelchair Mobility    Modified Rankin (Stroke Patients Only)       Balance                                    Cognition Arousal/Alertness: Awake/alert Behavior During Therapy: WFL for tasks assessed/performed Overall Cognitive Status: Within Functional Limits for tasks assessed                      Exercises      General Comments        Pertinent Vitals/Pain Pain Assessment: 0-10 Pain Score: 10-Worst pain ever Pain Location: back Pain Descriptors / Indicators: Constant;Radiating Pain Intervention(s): Monitored during session;Limited activity within patient's tolerance;Premedicated before session;Repositioned    Home Living                      Prior Function            PT Goals (current goals can now be found in the care plan section) Progress towards PT goals: Progressing toward goals (limited)    Frequency  Min 5X/week    PT Plan Current plan remains appropriate    Co-evaluation             End of Session Equipment Utilized During Treatment: Back brace Activity Tolerance: Patient limited by pain Patient left: in bed;with call bell/phone within reach;with family/visitor present     Time: 9323-5573 PT Time Calculation (min) (ACUTE ONLY): 11 min  Charges:  G CodesLeonard Schwartz, SPTA 07/06/2014, 2:11 PM

## 2014-07-06 NOTE — Progress Notes (Signed)
Subjective: Patient c/o left buttock pain.  Small bowel movement.  States that she now wants to go home and not snf for rehab.     Objective: Vital signs in last 24 hours: Temp:  [98.6 F (37 C)-101.2 F (38.4 C)] 98.6 F (37 C) (04/11 0530) Pulse Rate:  [78-92] 78 (04/11 0530) Resp:  [16-18] 16 (04/11 0530) BP: (82-90)/(47-53) 82/47 mmHg (04/11 0530) SpO2:  [95 %-99 %] 95 % (04/11 0530)  Intake/Output from previous day: 04/10 0701 - 04/11 0700 In: 480 [P.O.:480] Out: -  Intake/Output this shift:     Recent Labs  07/04/14 0648  HGB 9.2*    Recent Labs  07/04/14 0648  WBC 9.0  RBC 3.29*  HCT 27.9*  PLT 239    Recent Labs  07/04/14 0648  NA 137  K 3.7  CL 103  CO2 27  BUN 10  CREATININE 0.76  GLUCOSE 103*  CALCIUM 8.5   No results for input(s): LABPT, INR in the last 72 hours.  Exam:  Wound looks good.  No drainage or signs of infection.  bilat calves nontender, nvi.    Assessment/Plan: Will give bolus 1/2 ns now due to hypotension.  Check cbc and bmet.  Will see how she does today.  States that she would like to d/c home with a home health nurse/tech. i spoke with case Production designer, theatre/television/film.  Will also need to contact her pain management provider Dr Ardell Isaacs to see if he wants to increase her meds when she goes home.  Advised patient that with her BP she must call nurse to assist her when she gets up.  Ordered mag citrate for constipation.    Lc Joynt M 07/06/2014, 8:36 AM

## 2014-07-06 NOTE — Anesthesia Postprocedure Evaluation (Signed)
  Anesthesia Post-op Note  Patient: Kara [name]  Procedure(s) Performed: Procedure(s) (LRB): Removal Affix Spinous Process Plate (NuVasive) L4-5, L5 Gill Procedure, Left L5-S1 TLIF (Depuy), L4-S1 Pedicle Instrumentation (Depuy), L4-S1 Lateral Fusion (N/A)  Patient Location: PACU  Anesthesia Type: General  Level of Consciousness: awake and alert   Airway and Oxygen Therapy: Patient Spontanous Breathing  Post-op Pain: mild  Post-op Assessment: Post-op Vital signs reviewed, Patient's Cardiovascular Status Stable, Respiratory Function Stable, Patent Airway and No signs of Nausea or vomiting  Last Vitals:  Filed Vitals:   07/06/14 0530  BP: 82/47  Pulse: 78  Temp: 37 C  Resp: 16    Post-op Vital Signs: stable   Complications: No apparent anesthesia complications

## 2014-07-06 NOTE — Clinical Social Work Note (Signed)
Patient currently refusing SNF placement and requesting to discharge home. CSW signing off.  Marcelline Deist, [place] Cell: [phone]       Fax: [phone] Clinical Social Work: Orthopedics [phone]) and Surgical [phone])

## 2014-07-06 NOTE — Progress Notes (Signed)
Patient ID: Kara [name], female   DOB: 10-24-1955, 59 y.o.   MRN: [id number] Patient said daughter had some problems and had taken some of the patients medication and that she had been kicked  out of the home .   Patient used up her pain meds and has appt next week with Dr Jordan Likes for monthly refills.   Patient states she wants my help to get off narcotics. Plan discussion with Dr. Jordan Likes about this , i called his office left my cell with his staff and he will call me.  Will plan wean after 1 to 2 months post op. She has been on fentanyl patches 75, percocet 10   Six a day and also gets dilaudid 2mg   # 15 a months ( to prevent visits to ER to get IV  Pain meds).  Not sure she will be successful despite the surgery which will help her back pain and her leg pain.  Will see her one week post op.  She will not need HHPT, HHOT, does not need a nurse. Her wound looks good and she can shower at home and walk daily with her brace. When I entered room for more discussion of discharge plans she was asleep.   Husband here and he thanked me outside the room for having this discussion with her.

## 2014-07-06 NOTE — Care Management Note (Signed)
CARE MANAGEMENT NOTE 07/06/2014  Patient:  Kara [name], Kara [name]   Account Number:  [id number]  Date Initiated:  07/06/2014  Documentation initiated by:  Vance Peper  Subjective/Objective Assessment:   59 yr old female s/p L5-S1 TLIF, L4-S1 Lateral fusion.     Action/Plan:   Per Dr. Ophelia Charter patient will not require HHPT/RN or OT. Patietn will go home with brace. Has family support.   Anticipated DC Date:  07/06/2014   Anticipated DC Plan:  HOME/SELF CARE      DC Planning Services  CM consult      PAC Choice  DURABLE MEDICAL EQUIPMENT   Choice offered to / List presented to:     DME arranged  [facility]  3-N-1      DME agency  [facility]     [facility] arranged  NA      Status of service:  Completed, signed off Medicare Important Message given?   (If response is "NO", the following Medicare IM given date fields will be blank) Date Medicare IM given:   Medicare IM given by:   Date Additional Medicare IM given:   Additional Medicare IM given by:    Discharge Disposition:  HOME/SELF CARE  Per UR Regulation:  Reviewed for med. necessity/level of care/duration of stay  If discussed at Long Length of Stay Meetings, dates discussed:    Comments:  07/06/14 12:30pm Vance Peper, RN BSN CM CM contacted Jenel Lucks @ [employer] to inform her that Dr. states patient will not need home health RN or PT.     07/06/14 10:00am Vance Peper, RN BSN Case Manager CM receiveds call from [place] with [facility] that she be contacted for home health needs for patient. [phone] . [facility] arranges home health [phone].

## 2014-07-07 ENCOUNTER — Encounter (HOSPITAL_COMMUNITY): Payer: Self-pay | Admitting: *Deleted

## 2014-07-07 ENCOUNTER — Emergency Department (HOSPITAL_COMMUNITY)
Admission: EM | Admit: 2014-07-07 | Discharge: 2014-07-07 | Disposition: A | Discharge status: Home / Self Care | Payer: Managed Care, Other (non HMO) | Attending: Emergency Medicine | Admitting: Emergency Medicine

## 2014-07-07 DIAGNOSIS — Z791 Long term (current) use of non-steroidal anti-inflammatories (NSAID): Secondary | ICD-10-CM | POA: Insufficient documentation

## 2014-07-07 DIAGNOSIS — M1990 Unspecified osteoarthritis, unspecified site: Secondary | ICD-10-CM | POA: Insufficient documentation

## 2014-07-07 DIAGNOSIS — J45909 Unspecified asthma, uncomplicated: Secondary | ICD-10-CM | POA: Insufficient documentation

## 2014-07-07 DIAGNOSIS — N189 Chronic kidney disease, unspecified: Secondary | ICD-10-CM | POA: Insufficient documentation

## 2014-07-07 DIAGNOSIS — Z79899 Other long term (current) drug therapy: Secondary | ICD-10-CM | POA: Diagnosis not present

## 2014-07-07 DIAGNOSIS — F329 Major depressive disorder, single episode, unspecified: Secondary | ICD-10-CM | POA: Diagnosis not present

## 2014-07-07 DIAGNOSIS — Z8742 Personal history of other diseases of the female genital tract: Secondary | ICD-10-CM | POA: Insufficient documentation

## 2014-07-07 DIAGNOSIS — F419 Anxiety disorder, unspecified: Secondary | ICD-10-CM | POA: Insufficient documentation

## 2014-07-07 DIAGNOSIS — Z87891 Personal history of nicotine dependence: Secondary | ICD-10-CM | POA: Diagnosis not present

## 2014-07-07 DIAGNOSIS — M5440 Lumbago with sciatica, unspecified side: Secondary | ICD-10-CM | POA: Insufficient documentation

## 2014-07-07 DIAGNOSIS — M545 Low back pain: Secondary | ICD-10-CM | POA: Diagnosis present

## 2014-07-07 DIAGNOSIS — G8929 Other chronic pain: Secondary | ICD-10-CM | POA: Diagnosis not present

## 2014-07-07 MED ORDER — HYDROMORPHONE HCL 1 MG/ML IJ SOLN
1.0000 mg | Freq: Once | INTRAMUSCULAR | Status: AC
  Administered 2014-07-07: 1 mg via INTRAMUSCULAR
  Filled 2014-07-07: qty 1

## 2014-07-07 NOTE — ED Notes (Signed)
Family at bedside able to assist patient apply brace. Verbalized understanding of discharge instructions. Refused to sign. Patient states "sorry I am not mad at anybody here"

## 2014-07-07 NOTE — ED Notes (Signed)
Doctor ordered IM medication only at this time.

## 2014-07-07 NOTE — ED Notes (Signed)
Patient requested to have IV dilaudid 2 mg and be admitted to hospital. Before receiving IM dilaudid 1 mg.

## 2014-07-07 NOTE — ED Notes (Signed)
Doctor at bedside.

## 2014-07-07 NOTE — ED Notes (Addendum)
Pt states she had back surgery (L4, L5) on Friday. States she was discharged yesterday. States she is still experiencing back pain, trouble sleeping, difficulty ambulating and turning. States she had physical therapy while in the hospital. Pt states that she cannot complete activities of daily living since surgery. States that her Dr wants to cut her down on her medication and she does not agree. States she tried to go to her pain management clinic but they could not see her and they cannot prescribe any more until 4/24.

## 2014-07-07 NOTE — ED Notes (Signed)
Pt requesting admission to hospital for pain control.
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
## 2014-07-07 NOTE — ED Notes (Signed)
Patient refusing vital signs and family member at bedside. Attempting to put brace on patient who took off brace and states loudly "I don't want the brace" multiple times and hitting brace. Security called.

## 2014-07-18 ENCOUNTER — Emergency Department (HOSPITAL_COMMUNITY): Payer: Managed Care, Other (non HMO)

## 2014-07-18 ENCOUNTER — Encounter (HOSPITAL_COMMUNITY): Payer: Self-pay

## 2014-07-18 ENCOUNTER — Emergency Department (HOSPITAL_COMMUNITY)
Admission: EM | Admit: 2014-07-18 | Discharge: 2014-07-18 | Disposition: A | Discharge status: Home / Self Care | Payer: Managed Care, Other (non HMO) | Attending: Emergency Medicine | Admitting: Emergency Medicine

## 2014-07-18 DIAGNOSIS — G8929 Other chronic pain: Secondary | ICD-10-CM | POA: Insufficient documentation

## 2014-07-18 DIAGNOSIS — I959 Hypotension, unspecified: Secondary | ICD-10-CM | POA: Insufficient documentation

## 2014-07-18 DIAGNOSIS — Z872 Personal history of diseases of the skin and subcutaneous tissue: Secondary | ICD-10-CM | POA: Diagnosis not present

## 2014-07-18 DIAGNOSIS — X58XXXA Exposure to other specified factors, initial encounter: Secondary | ICD-10-CM | POA: Diagnosis not present

## 2014-07-18 DIAGNOSIS — Y999 Unspecified external cause status: Secondary | ICD-10-CM | POA: Diagnosis not present

## 2014-07-18 DIAGNOSIS — F419 Anxiety disorder, unspecified: Secondary | ICD-10-CM | POA: Diagnosis not present

## 2014-07-18 DIAGNOSIS — T8189XA Other complications of procedures, not elsewhere classified, initial encounter: Secondary | ICD-10-CM | POA: Insufficient documentation

## 2014-07-18 DIAGNOSIS — Z9889 Other specified postprocedural states: Secondary | ICD-10-CM | POA: Diagnosis not present

## 2014-07-18 DIAGNOSIS — R109 Unspecified abdominal pain: Secondary | ICD-10-CM | POA: Diagnosis present

## 2014-07-18 DIAGNOSIS — F909 Attention-deficit hyperactivity disorder, unspecified type: Secondary | ICD-10-CM | POA: Diagnosis not present

## 2014-07-18 DIAGNOSIS — Y929 Unspecified place or not applicable: Secondary | ICD-10-CM | POA: Diagnosis not present

## 2014-07-18 DIAGNOSIS — M5441 Lumbago with sciatica, right side: Secondary | ICD-10-CM | POA: Diagnosis not present

## 2014-07-18 DIAGNOSIS — N189 Chronic kidney disease, unspecified: Secondary | ICD-10-CM | POA: Insufficient documentation

## 2014-07-18 DIAGNOSIS — J45909 Unspecified asthma, uncomplicated: Secondary | ICD-10-CM | POA: Diagnosis not present

## 2014-07-18 DIAGNOSIS — Z87891 Personal history of nicotine dependence: Secondary | ICD-10-CM | POA: Insufficient documentation

## 2014-07-18 DIAGNOSIS — F329 Major depressive disorder, single episode, unspecified: Secondary | ICD-10-CM | POA: Diagnosis not present

## 2014-07-18 DIAGNOSIS — Y939 Activity, unspecified: Secondary | ICD-10-CM | POA: Diagnosis not present

## 2014-07-18 DIAGNOSIS — Z79899 Other long term (current) drug therapy: Secondary | ICD-10-CM | POA: Insufficient documentation

## 2014-07-18 DIAGNOSIS — T888XXA Other specified complications of surgical and medical care, not elsewhere classified, initial encounter: Secondary | ICD-10-CM

## 2014-07-18 LAB — CBC WITH DIFFERENTIAL/PLATELET
Basophils Absolute: 0.1 10*3/uL (ref 0.0–0.1)
Basophils Relative: 1 % (ref 0–1)
Eosinophils Absolute: 0.2 10*3/uL (ref 0.0–0.7)
Eosinophils Relative: 3 % (ref 0–5)
HEMATOCRIT: 31.9 % — AB (ref 36.0–46.0)
HEMOGLOBIN: 9.9 g/dL — AB (ref 12.0–15.0)
LYMPHS ABS: 3.1 10*3/uL (ref 0.7–4.0)
Lymphocytes Relative: 40 % (ref 12–46)
MCH: 25.9 pg — AB (ref 26.0–34.0)
MCHC: 31 g/dL (ref 30.0–36.0)
MCV: 83.5 fL (ref 78.0–100.0)
Monocytes Absolute: 0.6 10*3/uL (ref 0.1–1.0)
Monocytes Relative: 7 % (ref 3–12)
Neutro Abs: 3.9 10*3/uL (ref 1.7–7.7)
Neutrophils Relative %: 49 % (ref 43–77)
Platelets: 670 10*3/uL — ABNORMAL HIGH (ref 150–400)
RBC: 3.82 MIL/uL — ABNORMAL LOW (ref 3.87–5.11)
RDW: 13.3 % (ref 11.5–15.5)
WBC: 7.8 10*3/uL (ref 4.0–10.5)

## 2014-07-18 LAB — COMPREHENSIVE METABOLIC PANEL
ALK PHOS: 141 U/L — AB (ref 39–117)
ALT: 11 U/L (ref 0–35)
ANION GAP: 10 (ref 5–15)
AST: 16 U/L (ref 0–37)
Albumin: 3.3 g/dL — ABNORMAL LOW (ref 3.5–5.2)
BUN: 16 mg/dL (ref 6–23)
CALCIUM: 9 mg/dL (ref 8.4–10.5)
CHLORIDE: 103 mmol/L (ref 96–112)
CO2: 24 mmol/L (ref 19–32)
Creatinine, Ser: 0.95 mg/dL (ref 0.50–1.10)
GFR calc non Af Amer: 65 mL/min — ABNORMAL LOW (ref >90)
GFR, EST AFRICAN AMERICAN: 76 mL/min — AB (ref >90)
Glucose, Bld: 102 mg/dL — ABNORMAL HIGH (ref 70–99)
Potassium: 4.1 mmol/L (ref 3.5–5.1)
Sodium: 137 mmol/L (ref 135–145)
Total Bilirubin: 0.1 mg/dL — ABNORMAL LOW (ref 0.3–1.2)
Total Protein: 6.8 g/dL (ref 6.0–8.3)

## 2014-07-18 LAB — I-STAT TROPONIN, ED: TROPONIN I, POC: 0 ng/mL (ref 0.00–0.08)

## 2014-07-18 LAB — I-STAT CG4 LACTIC ACID, ED: Lactic Acid, Venous: 1.46 mmol/L (ref 0.5–2.0)

## 2014-07-18 MED ORDER — SODIUM CHLORIDE 0.9 % IV BOLUS (SEPSIS)
1000.0000 mL | Freq: Once | INTRAVENOUS | Status: AC
  Administered 2014-07-18: 1000 mL via INTRAVENOUS

## 2014-07-18 MED ORDER — OXYCODONE-ACETAMINOPHEN 5-325 MG PO TABS
2.0000 | ORAL_TABLET | Freq: Once | ORAL | Status: DC
  Filled 2014-07-18: qty 2

## 2014-07-18 MED ORDER — HYDROMORPHONE HCL 2 MG PO TABS
2.0000 mg | ORAL_TABLET | Freq: Once | ORAL | Status: AC
Start: 2014-07-18 — End: 2014-07-18
  Administered 2014-07-18: 2 mg via ORAL
  Filled 2014-07-18: qty 1

## 2014-07-18 MED ORDER — HYDROMORPHONE HCL 2 MG PO TABS: 2.0000 mg | ORAL_TABLET | ORAL | Status: DC | PRN

## 2014-07-18 NOTE — ED Notes (Signed)
BP taken twice in triage less than 90 systolic. Pt is alert and oriented. Hx reveals usually 110-120 systolic.

## 2014-07-18 NOTE — ED Notes (Signed)
Ice chips given per Dr. Rosalia Hammers

## 2014-07-18 NOTE — ED Notes (Signed)
Family is aware of pt. Needing urine specimen

## 2014-07-18 NOTE — ED Notes (Addendum)
  BP Cuff placed on PT in triage was not the correct size for the PT. Initial BP inaccurate. PT BP is now 98/65

## 2014-07-18 NOTE — ED Provider Notes (Signed)
CSN: [id number]     Arrival date & time 07/18/14  1739 History   First MD Initiated Contact with Patient 07/18/14 1836     Chief Complaint  Patient presents with  . Post-op Problem    rt. hip pain radiates down her leg and groin     (Consider location/radiation/quality/duration/timing/severity/associated sxs/prior Treatment) HPI 59 year old female status post lumbar spinal surgery April 8. She is discharged home 2 days later. 2 days ago she began having right-sided abdominal pain with radiation from the right mid flank area down into the upper leg on the right side. She describes this as painful. She states she has not been able to sleep for the past 2 days. It is unclear whether she has been eating or drinking very well. She describes nausea but does not have any vomiting. She denies fever, chills, cough, or chest pain. She reports that she has had a decrease in her narcotic pain medicine since the surgery. Her pain doctor has decreased her 2 Percocet by mouth. Past Medical History  Diagnosis Date  . Fibromyalgia   . 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 Arthritis   . Rheumatoid arthritis(714.0)     autoimmune arthritis; methotrexate once/week  . Depression     takes meds daily  . Post-nasal drip     hx of  . Other specified rheumatoid arthritis, right shoulder 08/01/2011  . Asthma   . Allergy   . Chronic kidney disease   . Urinary incontinence   . Family history of malignant neoplasm of breast   . Chronic pain     goes to [facility] for pain control  . Cellulitis of breast 11/2013    RIGHT BREAST  . Anxiety   . ADD (attention deficit disorder)     on Adderal  . Chronic fatigue and immune dysfunction syndrome    Past Surgical History  Procedure Laterality Date  . Shoulder surgery  2012    right  . Back surgery  2011    lower back fusion l4-5, s1  . Bladder surgery  2009  . Tubal ligation  1988  . Breast enhancement surgery   2003  . Liposuction  2003    abdominal  . Tonsillectomy  1987  . Total shoulder arthroplasty  08/01/2011    Procedure: TOTAL SHOULDER ARTHROPLASTY;  Surgeon: Eulas Post, MD;  Location: [facility] OR;  Service: Orthopedics;  Laterality: Right;  Right total shoulder arthroplasty  . Mass excision  11/03/2011    Procedure: MINOR EXCISION OF MASS;  Surgeon: Wyn Forster., MD;  Location: [facility];  Service: Orthopedics;  Laterality: Left;  debride IP joint, cyst excision left index  . Breast surgery  10/2013    REMOVAL OF BREAST IMPLANTS  . Breast implants removed    . Incision and drainage abscess Right 01/16/2014    Procedure: INCISION AND DRAINAGE AND OF RIGHT BREAST ABCESS;  Surgeon: Glenna Fellows, MD;  Location: [facility];  Service: General;  Laterality: Right;   Family History  Problem Relation Age of Onset  . Arthritis Mother   . Heart disease Mother     ?psvt  . Breast cancer Mother 61    TAH/BSO  . COPD Father   . Hypertension Father   . Breast cancer Maternal Aunt 27    deceased  . Cancer Cousin 30    female; unknown primary  . Colon cancer Paternal Aunt 76  deceased at 42  . Stomach cancer Paternal Uncle 46    deceased at 33   History  Substance Use Topics  . Smoking status: Former Smoker -- 0.50 packs/day for 10 years    Types: Cigarettes  . Smokeless tobacco: Never Used     Comment: smoked for 10 years  . Alcohol Use: No     Comment: denied alcohol   OB History    No data available     Review of Systems  Neurological: Positive for weakness and light-headedness.  All other systems reviewed and are negative.     Allergies  Aspirin; Ibuprofen; Ketamine; Nucynta; Ativan; and Tramadol hcl  Home Medications   Prior to Admission medications   Medication Sig Start Date End Date Taking? Authorizing Provider  ALPRAZolam Prudy Feeler) 1 MG tablet Take 1 mg by mouth 3 (three) times daily as needed for anxiety or sleep.  07/06/14  Yes Historical  Provider, MD  amphetamine-dextroamphetamine (ADDERALL) 30 MG tablet Take 30 mg by mouth 3 (three) times daily. 06/05/14  Yes Historical Provider, MD  BUTRANS 15 MCG/HR PTWK Place 1 patch onto the skin once a week. 07/13/14  Yes Historical Provider, MD  fentaNYL (DURAGESIC - DOSED MCG/HR) 75 MCG/HR Place 75 mcg onto the skin every 3 (three) days.   Yes Historical Provider, MD  Levomilnacipran HCl ER 120 MG CP24 Take 120 mg by mouth daily.   Yes Historical Provider, MD  oxyCODONE-acetaminophen (PERCOCET) 10-325 MG per tablet Take 1 tablet by mouth 5 (five) times daily. 06/18/14  Yes Historical Provider, MD  tiZANidine (ZANAFLEX) 4 MG tablet Take 4 mg by mouth 4 (four) times daily. 07/13/14  Yes Historical Provider, MD  venlafaxine XR (EFFEXOR-XR) 75 MG 24 hr capsule Take 75 mg by mouth daily with breakfast.   Yes Historical Provider, MD  zolpidem (AMBIEN) 10 MG tablet Take 10 mg by mouth at bedtime. 06/17/14  Yes Historical Provider, MD  albendazole (ALBENZA) 200 MG tablet Take 2 tablets (400 mg total) by mouth once. Patient not taking: Reported on 06/24/2014 06/03/14   [name], MD  clonazePAM (KLONOPIN) 0.5 MG tablet Take 1 tablet (0.5 mg total) by mouth 2 (two) times daily as needed (anxiety). Patient not taking: Reported on 06/24/2014 03/07/14   Adonis Brook, NP  docusate sodium (COLACE) 100 MG capsule Take 1 capsule (100 mg total) by mouth 2 (two) times daily. Patient not taking: Reported on 07/18/2014 07/06/14   Eldred Manges, MD  DULoxetine (CYMBALTA) 20 MG capsule Take 1 capsule (20 mg total) by mouth daily. Patient not taking: Reported on 06/24/2014 03/07/14   Adonis Brook, NP  HYDROmorphone (DILAUDID) 2 MG tablet Take 2 mg by mouth every 4 (four) hours as needed for severe pain.     Historical Provider, MD  meloxicam (MOBIC) 15 MG tablet Take 1 tablet (15 mg total) by mouth daily. Patient not taking: Reported on 06/24/2014 03/07/14   Adonis Brook, NP  meloxicam (MOBIC) 15 MG tablet TAKE  1 TABLET (15 MG TOTAL) BY MOUTH DAILY. 04/07/14   [name], MD  ondansetron (ZOFRAN ODT) 8 MG disintegrating tablet Take 1 tablet (8 mg total) by mouth every 8 (eight) hours as needed for nausea or vomiting. Patient not taking: Reported on 06/24/2014 03/07/14   Adonis Brook, NP  ondansetron (ZOFRAN ODT) 8 MG disintegrating tablet Take 1 tablet (8 mg total) by mouth every 8 (eight) hours as needed for nausea or vomiting. Patient not taking: Reported on 07/18/2014 03/07/14  Adonis Brook, NP  oxyCODONE 10 MG TABS Take one po q 4 hrs prn post op pain Patient not taking: Reported on 07/18/2014 07/06/14   Eldred Manges, MD  QUEtiapine (SEROQUEL) 25 MG tablet Take 1 tablet (25 mg total) by mouth daily. Patient not taking: Reported on 06/24/2014 03/07/14   Adonis Brook, NP  QUEtiapine (SEROQUEL) 50 MG tablet Take 1 tablet (50 mg total) by mouth at bedtime. Patient not taking: Reported on 06/24/2014 03/07/14   Adonis Brook, NP  terbinafine (LAMISIL) 250 MG tablet Take 1 tablet (250 mg total) by mouth daily. Patient not taking: Reported on 06/24/2014 05/25/14   [name], MD   BP 104/63 mmHg  Pulse 53  Temp(Src) 97.4 F (36.3 C) (Oral)  Resp 17  Ht  (1.626 m)  Wt 139 lb (63.05 kg)  BMI 23.85 kg/m2  SpO2 100% Physical Exam  Constitutional: She is oriented to person, place, and time. She appears well-developed and well-nourished.  HENT:  Head: Normocephalic.  New case membranes appear dry  Eyes: Conjunctivae and EOM are normal. Pupils are equal, round, and reactive to light.  Neck: Normal range of motion. Neck supple.  Cardiovascular: Normal rate, regular rhythm, normal heart sounds and intact distal pulses.   Pulmonary/Chest: Effort normal and breath sounds normal.  Abdominal: Soft. There is tenderness.  Musculoskeletal:  Well healing midline lower back scar without erythema or fluctuance. No discharges noted  Neurological: She is alert and oriented to person, place, and  time. She has normal reflexes. She displays normal reflexes. No cranial nerve deficit. She exhibits normal muscle tone. Coordination normal.  Psychiatric: Her speech is normal and behavior is normal. Judgment and thought content normal. Cognition and memory are normal. She exhibits a depressed mood.  Nursing note and vitals reviewed.   ED Course  Procedures (including critical care time) Labs Review Labs Reviewed  COMPREHENSIVE METABOLIC PANEL - Abnormal; Notable for the following:    Glucose, Bld 102 (*)    Albumin 3.3 (*)    Alkaline Phosphatase 141 (*)    Total Bilirubin 0.1 (*)    GFR calc non Af Amer 65 (*)    GFR calc Af Amer 76 (*)    All other components within normal limits  CBC WITH DIFFERENTIAL/PLATELET - Abnormal; Notable for the following:    RBC 3.82 (*)    Hemoglobin 9.9 (*)    HCT 31.9 (*)    MCH 25.9 (*)    Platelets 670 (*)    All other components within normal limits  CULTURE, BLOOD (ROUTINE X 2)  CULTURE, BLOOD (ROUTINE X 2)  URINE CULTURE  URINALYSIS, ROUTINE W REFLEX MICROSCOPIC  I-STAT CG4 LACTIC ACID, ED  I-STAT TROPOININ, ED    Imaging Review Ct Abdomen Pelvis Wo Contrast  07/18/2014   CLINICAL DATA:  Postoperative complications. L4-L5 and L5-S1 back operation. Marked increase in symptoms radiating to the RIGHT side.  EXAM: CT ABDOMEN AND PELVIS WITHOUT CONTRAST  TECHNIQUE: Multidetector CT imaging of the abdomen and pelvis was performed following the standard protocol without IV contrast.  COMPARISON:  CT myelogram 05/01/2014.  FINDINGS: Musculoskeletal: Lumbar spine findings will be dictated on dedicated lumbar spine CT.  Lung Bases: Atelectasis.  Liver: Small cyst in the inferior RIGHT hepatic lobe. This measures 1 cm. No change from prior. Unenhanced CT was performed per clinician order. Lack of IV contrast limits sensitivity and specificity, especially for evaluation of abdominal/pelvic solid viscera.  Spleen:  Old granulomatous disease.  Gallbladder:  Normal.  Common bile duct:  Normal.  Pancreas:  Normal.  Adrenal glands:  Normal bilaterally.  Kidneys: No calculi or hydronephrosis. LEFT ureter appears normal. RIGHT ureter appears normal.  Stomach:  Collapsed.  Small bowel:  Normal.  Colon: Normal appendix. Large stool burden. Colonic diverticulosis without diverticulitis.  Pelvic Genitourinary: Normal urinary bladder. Mildly atrophic uterus. Adnexal structures appear normal.  Peritoneum: No free fluid.  No free air in the abdomen.  Vasculature: Normal.  Body Wall: Small fat containing periumbilical hernia. Fat containing RIGHT inguinal hernia.  IMPRESSION: 1. No acute abdominal or pelvic abnormality. 2. Old granulomatous disease. 3. Small RIGHT hepatic lobe cyst. 4. Fat containing RIGHT inguinal hernia and small periumbilical hernia.   Electronically Signed   By: Andreas Newport M.D.   On: 07/18/2014 20:48   Dg Chest 2 View  07/18/2014   CLINICAL DATA:  Hypotension.  Back surgery 2 weeks ago.  EXAM: CHEST  2 VIEW  COMPARISON:  06/26/2014 chest radiograph.  FINDINGS: RIGHT shoulder hemiarthroplasty.  Size of the cardiopericardial silhouette is within normal limits. Opacity is present at the cardiac apex, likely representing large pericardial fat pad. Mediastinal contours normal. Trachea midline. No airspace disease or effusion. Partially calcified pulmonary nodule in the LEFT mid lung is chronic and compatible with old granulomatous disease.  IMPRESSION: No active cardiopulmonary disease.   Electronically Signed   By: Andreas Newport M.D.   On: 07/18/2014 19:36   Ct Lumbar Spine Wo Contrast  07/18/2014   CLINICAL DATA:  Increasing right-sided pain following back operation. Prior surgery from L4-S1.  EXAM: CT LUMBAR SPINE WITHOUT CONTRAST  TECHNIQUE: Multidetector CT imaging of the lumbar spine was performed without intravenous contrast administration. Multiplanar CT image reconstructions were also generated.  COMPARISON:  Abdominal CT today.  Myelogram  05/01/2014.  FINDINGS: Segmentation: Numbering used on prior exam preserved.  Alignment: Levoconvex curve of the lumbar spine is present. This is a gentle curvature with the apex at T12.  Vertebrae: Benign hemangioma at L1. No compression fracture. Postsurgical changes from L4 through S1 will be discussed below. Sacrum appears intact.  Paraspinal tissues: See abdominal CT.  There is a nonspecific fluid collection in the dorsal subcutaneous tissues extending up to the laminectomy bed from L4 through S1. Maximal axial dimensions are 36 mm transverse by 21 mm AP. Craniocaudal, this measures at least 92 mm. The appearance on a noncontrast CT is nonspecific. Differential considerations include abscess, seroma, or hematoma.  Specifically, the psoas muscles appear normal and symmetric. No mass effect to suggest a developing psoas abscess.  Disc levels:  Mild disc degeneration in the lower thoracic and upper lumbar spine.  Shallow circumferential disc bulge at L3-L4 with bilateral right-greater-than-left facet arthrosis. Mild central stenosis associated with facet arthrosis. No interval change compared to myelogram 05/01/2014.  Previous discectomy with transverse lumbar interbody fusion. Old LEFT-sided approach vertebral body screws. The L4-L5 spinous process clamp has been removed and wide posterior laminectomy has been performed. Posterior rod and pedicle screw fixation is new compared to prior. The angulation on the L4 pedicle screws is variable however both pedicle screws do appear located within the pedicles and terminate in the vertebral body despite the cranial and caudal angulation. LEFT L4 transverse process fracture is nondisplaced. No hardware failure or disconnection is identified. The interbody bone graft seen on the prior myelogram is unchanged, low with subsidence into the superior L5 endplate.  Interval bilateral L4 laminotomies and resection of the inferior portion of the spinous process. Bilateral  foraminotomies. L5 laminectomy.  The LEFT L5 pedicle screw traverses the pedicle. The hardware produces substantial artifact. There is lucency through the LEFT pedicle, which raises the possibility of a nondisplaced fracture of the pedicle however with the artifact, this is not a conclusive determination. The RIGHT pedicle screws appropriately located.  The L5-S1 central canal has been decompressed with the laminectomy. A portion of the RIGHT L5 lamina remains present. Foraminotomies has been performed along with discectomy. Interbody bone graft is present which appears located within the disc space. Anterolisthesis of L5 on S1 is unchanged compared to the prior exam. S1 laminectomy. The S1 pedicle screws are seated the do extend through the anterior cortex of the S1 body. There is no intrusion on the lateral recesses or foramina.  Significantly, there is no endplate osteolysis to suggest osteomyelitis although this is earlier than any changes would be seen typically. Posterior lateral more slated bone graft is present. There is no gas in the paraspinal soft tissues to suggest infection with a gas-forming organism.  IMPRESSION: Revision L4 through S1 posterior lumbar interbody fusion. Bilateral L4 laminotomies with L5 and S1 laminectomy. Fluid collection in the posterior subcutaneous soft tissues may represent postoperative seroma, hematoma, abscess. Spinal leak is in the differential considerations and this is suboptimally evaluated in the absence of contrast. No hardware failure. No hardware malposition.   Electronically Signed   By: Andreas Newport M.D.   On: 07/18/2014 21:06     EKG Interpretation None      MDM   Final diagnoses:  Hypotension  Right-sided low back pain with right-sided sciatica  Fluid collection at surgical site, initial encounter    59 year old female comes in today complaining of right flank pain. She has a history of chronic pain and has had a recent orthopedic back surgery.  Initially she had some hypotension with 2 low blood pressures measured at 68/46 and 81/53. However, she did not have any reactive tachycardia here. Since then blood pressures have been normotensive to elevated with the last blood pressure 145/83. Given the recent surgical intervention, she was evaluated for infection but has a normal lactic acid. Not febrile. Chief concern of hers is her decrease in narcotic pain medicine. She reports that her pain management physicians have decreased her multiple narcotics to Percocet as of last Monday. She complains of increasing pain and the fact that the Percocet doesn't work for her. She states that she was told that she would be treated down more gradually longer after surgery. I spoke with Dr. August Saucer. I discussed with Dr. August Saucer with the patient that there is some fluid on the CT of her back. There is no current white blood cell count, fever, or signs of redness on her CT scan. Dr. August Saucer states that Dr. Ophelia Charter will need to make a decision about whether or not he will range is fluid on Monday. The current plan is that I will give her a prescription to allow her to have 2 mg of Dilaudid by mouth every 4 hours from now until she can be seen by Dr. Ophelia Charter on Monday. I've also discussed that if she were to develop signs of infection especially from the area on the back such as increased redness or fever she should return for reevaluation. The patient voices understanding. She will call the office at 8:15 on Monday morning to be seen by Dr. Ophelia Charter or Dr. August Saucer.   Margarita Grizzle, MD 07/19/14 [phone]

## 2014-07-18 NOTE — ED Notes (Signed)
Pt. Left with all belongings 

## 2014-07-18 NOTE — ED Notes (Signed)
Informed Dr. Rosalia Hammers possible sepsis

## 2014-07-18 NOTE — ED Notes (Signed)
Pt had a cage put in for surgery to L4-5 and S1. Two days ago started having increase in pain to her right side. Called the doctor's office today and was told to come to the ED. Dr. Ophelia Charter did the surgery. Pt denies any numbness or tingling down her right side.

## 2014-07-18 NOTE — Discharge Instructions (Signed)
Call Dr. Kevan Ny office at 8:15 on Monday morning for recheck on Monday. Return if there are any signs of infection such as fever, chills, redness, or drainage at the back.

## 2014-07-18 NOTE — ED Notes (Signed)
Pt. Had surgery  On 07/03/14 of her lumbar area and has been doing well.  In the last 2 days she has been having increased pain to her rt. Lateral hip area into her lt. Groin and lt. Leg. She denies any numbness and tingling.  Pt. Had been on many pain medications. And this past Monday the pain clinic stopped all of her pain medications and only prescribed her Percocet. This is not helping with her pain, Denies any n/v/d denies any fevers.   GCS 15

## 2014-07-25 LAB — CULTURE, BLOOD (ROUTINE X 2)
CULTURE: NO GROWTH
Culture: NO GROWTH

## 2014-07-27 ENCOUNTER — Emergency Department (HOSPITAL_COMMUNITY)
Admission: EM | Admit: 2014-07-27 | Discharge: 2014-07-27 | Disposition: A | Discharge status: Home / Self Care | Payer: Managed Care, Other (non HMO) | Attending: Emergency Medicine | Admitting: Emergency Medicine

## 2014-07-27 ENCOUNTER — Encounter (HOSPITAL_COMMUNITY): Payer: Self-pay | Admitting: *Deleted

## 2014-07-27 DIAGNOSIS — Z87891 Personal history of nicotine dependence: Secondary | ICD-10-CM | POA: Insufficient documentation

## 2014-07-27 DIAGNOSIS — F329 Major depressive disorder, single episode, unspecified: Secondary | ICD-10-CM | POA: Insufficient documentation

## 2014-07-27 DIAGNOSIS — Z791 Long term (current) use of non-steroidal anti-inflammatories (NSAID): Secondary | ICD-10-CM | POA: Insufficient documentation

## 2014-07-27 DIAGNOSIS — F1123 Opioid dependence with withdrawal: Secondary | ICD-10-CM | POA: Insufficient documentation

## 2014-07-27 DIAGNOSIS — R531 Weakness: Secondary | ICD-10-CM | POA: Diagnosis present

## 2014-07-27 DIAGNOSIS — R109 Unspecified abdominal pain: Secondary | ICD-10-CM | POA: Insufficient documentation

## 2014-07-27 DIAGNOSIS — M1990 Unspecified osteoarthritis, unspecified site: Secondary | ICD-10-CM | POA: Diagnosis not present

## 2014-07-27 DIAGNOSIS — G8929 Other chronic pain: Secondary | ICD-10-CM | POA: Insufficient documentation

## 2014-07-27 DIAGNOSIS — N189 Chronic kidney disease, unspecified: Secondary | ICD-10-CM | POA: Diagnosis not present

## 2014-07-27 DIAGNOSIS — Z79899 Other long term (current) drug therapy: Secondary | ICD-10-CM | POA: Insufficient documentation

## 2014-07-27 DIAGNOSIS — F419 Anxiety disorder, unspecified: Secondary | ICD-10-CM | POA: Diagnosis not present

## 2014-07-27 DIAGNOSIS — F1193 Opioid use, unspecified with withdrawal: Secondary | ICD-10-CM

## 2014-07-27 DIAGNOSIS — Z8742 Personal history of other diseases of the female genital tract: Secondary | ICD-10-CM | POA: Insufficient documentation

## 2014-07-27 DIAGNOSIS — J45909 Unspecified asthma, uncomplicated: Secondary | ICD-10-CM | POA: Diagnosis not present

## 2014-07-27 LAB — COMPREHENSIVE METABOLIC PANEL
ALT: 13 U/L — ABNORMAL LOW (ref 14–54)
AST: 16 U/L (ref 15–41)
Albumin: 4.1 g/dL (ref 3.5–5.0)
Alkaline Phosphatase: 161 U/L — ABNORMAL HIGH (ref 38–126)
Anion gap: 9 (ref 5–15)
BUN: 11 mg/dL (ref 6–20)
CHLORIDE: 105 mmol/L (ref 101–111)
CO2: 26 mmol/L (ref 22–32)
CREATININE: 0.68 mg/dL (ref 0.44–1.00)
Calcium: 9.6 mg/dL (ref 8.9–10.3)
GFR calc non Af Amer: >60 mL/min (ref >60)
Glucose, Bld: 107 mg/dL — ABNORMAL HIGH (ref 70–99)
POTASSIUM: 4 mmol/L (ref 3.5–5.1)
Sodium: 140 mmol/L (ref 135–145)
Total Bilirubin: 0.4 mg/dL (ref 0.3–1.2)
Total Protein: 7.9 g/dL (ref 6.5–8.1)

## 2014-07-27 LAB — CBC
HCT: 36.2 % (ref 36.0–46.0)
Hemoglobin: 11.4 g/dL — ABNORMAL LOW (ref 12.0–15.0)
MCH: 26 pg (ref 26.0–34.0)
MCHC: 31.5 g/dL (ref 30.0–36.0)
MCV: 82.5 fL (ref 78.0–100.0)
Platelets: 580 10*3/uL — ABNORMAL HIGH (ref 150–400)
RBC: 4.39 MIL/uL (ref 3.87–5.11)
RDW: 13.8 % (ref 11.5–15.5)
WBC: 7.7 10*3/uL (ref 4.0–10.5)

## 2014-07-27 MED ORDER — CLONIDINE HCL 0.1 MG PO TABS: 0.1000 mg | ORAL_TABLET | Freq: Two times a day (BID) | ORAL | Status: DC

## 2014-07-27 NOTE — ED Notes (Signed)
Pt feels she is going through withdrawal from pain medication.  Pt was operated on by Dr Ophelia Charter,  L4-L5, S1 April 8th.  Before the surgery she had been taking high doses of pain medication.  Since the surgery they have greatly reduced her pain medication - now she's only taking .05 pill of 10/325 percocet.  Per husband, pt was her norm this am, but she feels weak, emotional, having diarrhea, but she feels she cannot function.  Pt tearful and crying.  Denies suicidal ideations.

## 2014-07-28 NOTE — ED Provider Notes (Signed)
CSN: [id number]     Arrival date & time 07/27/14  1526 History   First MD Initiated Contact with Patient 07/27/14 2050     Chief Complaint  Patient presents with  . Weakness     (Consider location/radiation/quality/duration/timing/severity/associated sxs/prior Treatment) HPI Comments: Pt with long hx of chronic pain on multiple narcotic medications including dilaudid, percocet and fentanyl patches who recently had surgery and has had incredible improvement of pain however now she is trying to get off pain meds however feels the weaning process of cold [place] is causing too many side effects.  She tried to see pain management today but was not satisfied with their help and was prescribed nucynta which makes her hallucinate.  Pt denies SI, HI, new feelings of depression.  Patient is a 59 y.o. female presenting with weakness. The history is provided by the patient and the spouse.  Weakness This is a new problem. Episode onset: 3 days. The problem occurs constantly. The problem has been gradually worsening. Associated symptoms include abdominal pain. Pertinent negatives include no chest pain and no shortness of breath. Associated symptoms comments: Chills, sweats, abdominal cramping, diarrhea, nausea, decreased appetite, emotional outburst. Nothing aggravates the symptoms. The symptoms are relieved by medications.    Past Medical History  Diagnosis Date  . Fibromyalgia   . 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 Arthritis   . Rheumatoid arthritis(714.0)     autoimmune arthritis; methotrexate once/week  . Depression     takes meds daily  . Post-nasal drip     hx of  . Other specified rheumatoid arthritis, right shoulder 08/01/2011  . Asthma   . Allergy   . Chronic kidney disease   . Urinary incontinence   . Family history of malignant neoplasm of breast   . Chronic pain     goes to [facility]  . Cellulitis of breast 11/2013     RIGHT BREAST  . Anxiety   . ADD (attention deficit disorder)     on Adderal  . Chronic fatigue and immune dysfunction syndrome    Past Surgical History  Procedure Laterality Date  . Shoulder surgery  2012    right  . Back surgery  2011    lower back fusion l4-5, s1  . Bladder surgery  2009  . Tubal ligation  1988  . Breast enhancement surgery  2003  . Liposuction  2003    abdominal  . Tonsillectomy  1987  . Total shoulder arthroplasty  08/01/2011    Procedure: TOTAL SHOULDER ARTHROPLASTY;  Surgeon: Eulas Post, MD;  Location: [facility] OR;  Service: Orthopedics;  Laterality: Right;  Right total shoulder arthroplasty  . Mass excision  11/03/2011    Procedure: MINOR EXCISION OF MASS;  Surgeon: Wyn Forster., MD;  Location: [facility];  Service: Orthopedics;  Laterality: Left;  debride IP joint, cyst excision left index  . Breast surgery  10/2013    REMOVAL OF BREAST IMPLANTS  . Breast implants removed    . Incision and drainage abscess Right 01/16/2014    Procedure: INCISION AND DRAINAGE AND OF RIGHT BREAST ABCESS;  Surgeon: Glenna Fellows, MD;  Location: [facility] ORS;  Service: General;  Laterality: Right;   Family History  Problem Relation Age of Onset  . Arthritis Mother   . Heart disease Mother     ?psvt  . Breast cancer Mother 34    TAH/BSO  .  COPD Father   . Hypertension Father   . Breast cancer Maternal Aunt 27    deceased  . Cancer Cousin 9    female; unknown primary  . Colon cancer Paternal Aunt 55    deceased at 58  . Stomach cancer Paternal Uncle 11    deceased at 27   History  Substance Use Topics  . Smoking status: Former Smoker -- 0.50 packs/day for 10 years    Types: Cigarettes  . Smokeless tobacco: Never Used     Comment: smoked for 10 years  . Alcohol Use: No     Comment: denied alcohol   OB History    No data available     Review of Systems  Respiratory: Negative for shortness of breath.   Cardiovascular: Negative for chest  pain.  Gastrointestinal: Positive for abdominal pain.  Neurological: Positive for weakness.  Psychiatric/Behavioral: The patient is nervous/anxious.   All other systems reviewed and are negative.     Allergies  Aspirin; Ibuprofen; Ketamine; Nucynta; Ativan; and Tramadol hcl  Home Medications   Prior to Admission medications   Medication Sig Start Date End Date Taking? Authorizing Provider  albendazole (ALBENZA) 200 MG tablet Take 2 tablets (400 mg total) by mouth once. Patient not taking: Reported on 06/24/2014 06/03/14   [name], MD  ALPRAZolam Prudy Feeler) 1 MG tablet Take 1 mg by mouth 3 (three) times daily as needed for anxiety or sleep.  07/06/14   Historical Provider, MD  amphetamine-dextroamphetamine (ADDERALL) 30 MG tablet Take 30 mg by mouth 3 (three) times daily. 06/05/14   Historical Provider, MD  BUTRANS 15 MCG/HR PTWK Place 1 patch onto the skin once a week. 07/13/14   Historical Provider, MD  clonazePAM (KLONOPIN) 0.5 MG tablet Take 1 tablet (0.5 mg total) by mouth 2 (two) times daily as needed (anxiety). Patient not taking: Reported on 06/24/2014 03/07/14   Adonis Brook, NP  cloNIDine (CATAPRES) 0.1 MG tablet Take 1 tablet (0.1 mg total) by mouth 2 (two) times daily. 07/27/14   Gwyneth Sprout, MD  docusate sodium (COLACE) 100 MG capsule Take 1 capsule (100 mg total) by mouth 2 (two) times daily. Patient not taking: Reported on 07/18/2014 07/06/14   Eldred Manges, MD  DULoxetine (CYMBALTA) 20 MG capsule Take 1 capsule (20 mg total) by mouth daily. Patient not taking: Reported on 06/24/2014 03/07/14   Adonis Brook, NP  fentaNYL (DURAGESIC - DOSED MCG/HR) 75 MCG/HR Place 75 mcg onto the skin every 3 (three) days.    Historical Provider, MD  HYDROmorphone (DILAUDID) 2 MG tablet Take 1 tablet (2 mg total) by mouth every 4 (four) hours as needed for severe pain. 07/18/14   Margarita Grizzle, MD  Levomilnacipran HCl ER 120 MG CP24 Take 120 mg by mouth daily.    Historical Provider, MD   meloxicam (MOBIC) 15 MG tablet Take 1 tablet (15 mg total) by mouth daily. Patient not taking: Reported on 06/24/2014 03/07/14   Adonis Brook, NP  meloxicam (MOBIC) 15 MG tablet TAKE 1 TABLET (15 MG TOTAL) BY MOUTH DAILY. 04/07/14   [name], MD  ondansetron (ZOFRAN ODT) 8 MG disintegrating tablet Take 1 tablet (8 mg total) by mouth every 8 (eight) hours as needed for nausea or vomiting. Patient not taking: Reported on 06/24/2014 03/07/14   Adonis Brook, NP  ondansetron (ZOFRAN ODT) 8 MG disintegrating tablet Take 1 tablet (8 mg total) by mouth every 8 (eight) hours as needed for nausea or vomiting. Patient not  taking: Reported on 07/18/2014 03/07/14   Adonis Brook, NP  oxyCODONE 10 MG TABS Take one po q 4 hrs prn post op pain Patient not taking: Reported on 07/18/2014 07/06/14   Eldred Manges, MD  oxyCODONE-acetaminophen (PERCOCET) 10-325 MG per tablet Take 1 tablet by mouth 5 (five) times daily. 06/18/14   Historical Provider, MD  QUEtiapine (SEROQUEL) 25 MG tablet Take 1 tablet (25 mg total) by mouth daily. Patient not taking: Reported on 06/24/2014 03/07/14   Adonis Brook, NP  QUEtiapine (SEROQUEL) 50 MG tablet Take 1 tablet (50 mg total) by mouth at bedtime. Patient not taking: Reported on 06/24/2014 03/07/14   Adonis Brook, NP  terbinafine (LAMISIL) 250 MG tablet Take 1 tablet (250 mg total) by mouth daily. Patient not taking: Reported on 06/24/2014 05/25/14   [name], MD  tiZANidine (ZANAFLEX) 4 MG tablet Take 4 mg by mouth 4 (four) times daily. 07/13/14   Historical Provider, MD  venlafaxine XR (EFFEXOR-XR) 75 MG 24 hr capsule Take 75 mg by mouth daily with breakfast.    Historical Provider, MD  zolpidem (AMBIEN) 10 MG tablet Take 10 mg by mouth at bedtime. 06/17/14   Historical Provider, MD   BP 165/99 mmHg  Pulse 93  Temp(Src) 98.6 F (37 C) (Oral)  Resp 21  Ht  (1.626 m)  Wt 135 lb (61.236 kg)  BMI 23.16 kg/m2  SpO2 100% Physical Exam  Constitutional:  She is oriented to person, place, and time. She appears well-developed and well-nourished. No distress.  HENT:  Head: Normocephalic and atraumatic.  Mouth/Throat: Oropharynx is clear and moist.  Eyes: Conjunctivae and EOM are normal. Pupils are equal, round, and reactive to light.  Neck: Normal range of motion. Neck supple.  Cardiovascular: Normal rate, regular rhythm and intact distal pulses.   No murmur heard. Pulmonary/Chest: Effort normal and breath sounds normal. No respiratory distress. She has no wheezes. She has no rales.  Abdominal: Soft. She exhibits no distension. There is no tenderness. There is no rebound and no guarding.  Musculoskeletal: Normal range of motion. She exhibits no edema or tenderness.  Neurological: She is alert and oriented to person, place, and time.  Skin: Skin is warm and dry. No rash noted. No erythema.  Psychiatric: She has a normal mood and affect. Her behavior is normal. Her mood appears not anxious. She expresses no suicidal plans and no homicidal plans.  Tearful on exam  Nursing note and vitals reviewed.   ED Course  Procedures (including critical care time) Labs Review Labs Reviewed  CBC - Abnormal; Notable for the following:    Hemoglobin 11.4 (*)    Platelets 580 (*)    All other components within normal limits  COMPREHENSIVE METABOLIC PANEL - Abnormal; Notable for the following:    Glucose, Bld 107 (*)    ALT 13 (*)    Alkaline Phosphatase 161 (*)    All other components within normal limits    Imaging Review No results found.   EKG Interpretation   Date/Time:  Monday Jul 27 2014 15:36:15 EDT Ventricular Rate:  108 PR Interval:  124 QRS Duration: 80 QT Interval:  320 QTC Calculation: 428 R Axis:   85 Text Interpretation:  Sinus tachycardia Nonspecific ST abnormality No  significant change since last tracing Confirmed by Anitra Lauth  MD, Alphonzo Lemmings  [phone]) on 07/27/2014 8:50:16 PM      MDM   Final diagnoses:  Opiate withdrawal     Pt with hx of chronic  pain who recent had surgery in the last month with significant improvement in pain.  However now she wished to wean off narcotics and is currently having opiate withdrawal.  Pt is having mood fluctuantions, diarrhea, abd cramping, nausea, chills and diaphoresis for the last few days.  Pt was on a high dose of narcotics with fentanyl patches and percocet.  She currently was taken off fentanyl patches 75mg  this week and not tolerating well.  She went to pain management today and they gave her a ppx for nucynta which she states causes hallucinations.  She is requesting help with detox and narcotic taper.  She denies SI and has good family support, counseling and a psychiatrist.  She does not feel that she needs to speak with TTS today.  Discussed starting clonidine and f/u with pain management for medication taper vs f/u with PCP or Dr. .  Pt and husband are in agreement with plan.  She is non-toxic appearing and labs without acute findings.   Ophelia Charter, MD 07/28/14 [phone]

## 2014-07-30 ENCOUNTER — Ambulatory Visit (INDEPENDENT_AMBULATORY_CARE_PROVIDER_SITE_OTHER): Payer: Managed Care, Other (non HMO) | Admitting: Family Medicine

## 2014-07-30 ENCOUNTER — Encounter: Payer: Self-pay | Admitting: Family Medicine

## 2014-07-30 VITALS — BP 122/80 | HR 96 | Temp 99.4°F | Wt 138.0 lb

## 2014-07-30 DIAGNOSIS — G894 Chronic pain syndrome: Secondary | ICD-10-CM | POA: Diagnosis not present

## 2014-07-30 NOTE — Addendum Note (Signed)
Addended by: Shelby Dubin E on: 07/30/2014 02:20 PM   Modules accepted: Orders, Medications

## 2014-07-30 NOTE — Discharge Summary (Signed)
Patient ID: Kara [name] MRN: [id number] DOB/AGE: Dec 21, 1955 59 y.o.  Admit date: 07/03/2014 Discharge date: 07/30/2014  Admission Diagnoses:  Active Problems:   S/P lumbar fusion   Discharge Diagnoses:  Active Problems:   S/P lumbar fusion  status post Procedure(s): Removal Affix Spinous Process Plate (NuVasive) L4-5, L5 Gill Procedure, Left L5-S1 TLIF (Depuy), L4-S1 Pedicle Instrumentation (Depuy), L4-S1 Lateral Fusion  Past Medical History  Diagnosis Date  . Fibromyalgia   . 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 Arthritis   . Rheumatoid arthritis(714.0)     autoimmune arthritis; methotrexate once/week  . Depression     takes meds daily  . Post-nasal drip     hx of  . Other specified rheumatoid arthritis, right shoulder 08/01/2011  . Asthma   . Allergy   . Chronic kidney disease   . Urinary incontinence   . Family history of malignant neoplasm of breast   . Chronic pain     goes to [facility] for [facility] control  . Cellulitis of breast 11/2013    RIGHT BREAST  . Anxiety   . ADD (attention deficit disorder)     on Adderal  . Chronic fatigue and immune dysfunction syndrome     Surgeries: Procedure(s): Removal Affix Spinous Process Plate (NuVasive) L4-5, L5 Gill Procedure, Left L5-S1 TLIF (Depuy), L4-S1 Pedicle Instrumentation (Depuy), L4-S1 Lateral Fusion on 07/03/2014   Consultants:    Discharged Condition: Improved  Hospital Course: Kara [name] is an 59 y.o. female who was admitted 07/03/2014 for operative treatment of lumbar stenosis. Patient failed conservative treatments (please see the history and physical for the specifics) and had severe unremitting pain that affects sleep, daily activities and work/hobbies. After pre-op clearance, the patient was taken to the operating room on 07/03/2014 and underwent  Procedure(s): Removal Affix Spinous Process Plate (NuVasive) L4-5, L5 Gill Procedure, Left L5-S1 TLIF (Depuy), L4-S1  Pedicle Instrumentation (Depuy), L4-S1 Lateral Fusion.    Patient was given perioperative antibiotics:  Anti-infectives    Start     Dose/Rate Route Frequency Ordered Stop   07/03/14 2000  ceFAZolin (ANCEF) IVPB 1 g/50 mL premix     1 g 100 mL/hr over 30 Minutes Intravenous Every 8 hours 07/03/14 1612 07/04/14 0625   07/03/14 1615  terbinafine (LAMISIL) tablet 250 mg  Status:  Discontinued     250 mg Oral Daily 07/03/14 1612 07/03/14 1637   07/03/14 1615  albendazole (ALBENZA) tablet 400 mg  Status:  Discontinued     400 mg Oral  Once 07/03/14 1612 07/03/14 1638   07/03/14 0600  ceFAZolin (ANCEF) IVPB 2 g/50 mL premix     2 g 100 mL/hr over 30 Minutes Intravenous On call to O.R. 07/02/14 1425 07/03/14 1127       Patient was given sequential compression devices and early ambulation to prevent DVT.   Patient benefited maximally from hospital stay and there were no complications. At the time of discharge, the patient was urinating/moving their bowels without difficulty, tolerating a regular diet, pain is controlled with oral pain medications and they have been cleared by PT/OT.   Recent vital signs: No data found.    Recent laboratory studies: No results for input(s): WBC, HGB, HCT, PLT, NA, K, CL, CO2, BUN, CREATININE, GLUCOSE, INR, CALCIUM in the last 72 hours.  Invalid input(s): PT, 2   Discharge Medications:     Medication List    STOP taking these medications  albendazole 200 MG tablet  Commonly known as:  ALBENZA     ALPRAZolam 0.5 MG tablet  Commonly known as:  XANAX     clonazePAM 0.5 MG tablet  Commonly known as:  KLONOPIN     DULoxetine 20 MG capsule  Commonly known as:  CYMBALTA     fentaNYL 75 MCG/HR  Commonly known as:  DURAGESIC - dosed mcg/hr     HYDROmorphone 2 MG tablet  Commonly known as:  DILAUDID     Levomilnacipran HCl ER 120 MG Cp24     ondansetron 8 MG disintegrating tablet  Commonly known as:  ZOFRAN ODT     oxyCODONE-acetaminophen  10-325 MG per tablet  Commonly known as:  PERCOCET     QUEtiapine 25 MG tablet  Commonly known as:  SEROQUEL     QUEtiapine 50 MG tablet  Commonly known as:  SEROQUEL     terbinafine 250 MG tablet  Commonly known as:  LAMISIL     zolpidem 10 MG tablet  Commonly known as:  AMBIEN      TAKE these medications        amphetamine-dextroamphetamine 30 MG tablet  Commonly known as:  ADDERALL  Take 30 mg by mouth 3 (three) times daily.     meloxicam 15 MG tablet  Commonly known as:  MOBIC  TAKE 1 TABLET (15 MG TOTAL) BY MOUTH DAILY.     venlafaxine XR 75 MG 24 hr capsule  Commonly known as:  EFFEXOR-XR  Take 75 mg by mouth daily with breakfast.        Diagnostic Studies: Ct Abdomen Pelvis Wo Contrast  07/18/2014   CLINICAL DATA:  Postoperative complications. L4-L5 and L5-S1 back operation. Marked increase in symptoms radiating to the RIGHT side.  EXAM: CT ABDOMEN AND PELVIS WITHOUT CONTRAST  TECHNIQUE: Multidetector CT imaging of the abdomen and pelvis was performed following the standard protocol without IV contrast.  COMPARISON:  CT myelogram 05/01/2014.  FINDINGS: Musculoskeletal: Lumbar spine findings will be dictated on dedicated lumbar spine CT.  Lung Bases: Atelectasis.  Liver: Small cyst in the inferior RIGHT hepatic lobe. This measures 1 cm. No change from prior. Unenhanced CT was performed per clinician order. Lack of IV contrast limits sensitivity and specificity, especially for evaluation of abdominal/pelvic solid viscera.  Spleen:  Old granulomatous disease.  Gallbladder:  Normal.  Common bile duct:  Normal.  Pancreas:  Normal.  Adrenal glands:  Normal bilaterally.  Kidneys: No calculi or hydronephrosis. LEFT ureter appears normal. RIGHT ureter appears normal.  Stomach:  Collapsed.  Small bowel:  Normal.  Colon: Normal appendix. Large stool burden. Colonic diverticulosis without diverticulitis.  Pelvic Genitourinary: Normal urinary bladder. Mildly atrophic uterus. Adnexal  structures appear normal.  Peritoneum: No free fluid.  No free air in the abdomen.  Vasculature: Normal.  Body Wall: Small fat containing periumbilical hernia. Fat containing RIGHT inguinal hernia.  IMPRESSION: 1. No acute abdominal or pelvic abnormality. 2. Old granulomatous disease. 3. Small RIGHT hepatic lobe cyst. 4. Fat containing RIGHT inguinal hernia and small periumbilical hernia.   Electronically Signed   By: Andreas Newport M.D.   On: 07/18/2014 20:48   Dg Chest 2 View  07/18/2014   CLINICAL DATA:  Hypotension.  Back surgery 2 weeks ago.  EXAM: CHEST  2 VIEW  COMPARISON:  06/26/2014 chest radiograph.  FINDINGS: RIGHT shoulder hemiarthroplasty.  Size of the cardiopericardial silhouette is within normal limits. Opacity is present at the cardiac apex, likely representing large pericardial fat pad.  Mediastinal contours normal. Trachea midline. No airspace disease or effusion. Partially calcified pulmonary nodule in the LEFT mid lung is chronic and compatible with old granulomatous disease.  IMPRESSION: No active cardiopulmonary disease.   Electronically Signed   By: Andreas Newport M.D.   On: 07/18/2014 19:36   Dg Lumbar Spine 2-3 Views  07/03/2014   CLINICAL DATA:  Removal Affix Spinous Process Plate (NuVasive) L4-5, L5 Gill Procedure, Left L5-S1 TLIF (Depuy), L4-S1 Pedicle Instrumentation (Depuy), L4-S1 Lateral Fusion,  EXAM: DG C-ARM GT 120 MIN; LUMBAR SPINE - 2-3 VIEW  FLUOROSCOPY TIME:  If the device does not provide the exposure index:  Fluoroscopy Time (in minutes and seconds):  50 seconds  Number of Acquired Images:  2  COMPARISON:  None  FINDINGS: Posterior lumbar interbody fusion with interbody spacer devices at L4-L5 and L5-S1. Posterior tissue retractors are present. Bilateral pedicle screws at each level with horizontal stabilizer bars.  IMPRESSION: Posterior lumbar interbody fusion L4-S1.   Electronically Signed   By: Elige Ko   On: 07/03/2014 12:22   Ct Lumbar Spine Wo  Contrast  07/18/2014   CLINICAL DATA:  Increasing right-sided pain following back operation. Prior surgery from L4-S1.  EXAM: CT LUMBAR SPINE WITHOUT CONTRAST  TECHNIQUE: Multidetector CT imaging of the lumbar spine was performed without intravenous contrast administration. Multiplanar CT image reconstructions were also generated.  COMPARISON:  Abdominal CT today.  Myelogram 05/01/2014.  FINDINGS: Segmentation: Numbering used on prior exam preserved.  Alignment: Levoconvex curve of the lumbar spine is present. This is a gentle curvature with the apex at T12.  Vertebrae: Benign hemangioma at L1. No compression fracture. Postsurgical changes from L4 through S1 will be discussed below. Sacrum appears intact.  Paraspinal tissues: See abdominal CT.  There is a nonspecific fluid collection in the dorsal subcutaneous tissues extending up to the laminectomy bed from L4 through S1. Maximal axial dimensions are 36 mm transverse by 21 mm AP. Craniocaudal, this measures at least 92 mm. The appearance on a noncontrast CT is nonspecific. Differential considerations include abscess, seroma, or hematoma.  Specifically, the psoas muscles appear normal and symmetric. No mass effect to suggest a developing psoas abscess.  Disc levels:  Mild disc degeneration in the lower thoracic and upper lumbar spine.  Shallow circumferential disc bulge at L3-L4 with bilateral right-greater-than-left facet arthrosis. Mild central stenosis associated with facet arthrosis. No interval change compared to myelogram 05/01/2014.  Previous discectomy with transverse lumbar interbody fusion. Old LEFT-sided approach vertebral body screws. The L4-L5 spinous process clamp has been removed and wide posterior laminectomy has been performed. Posterior rod and pedicle screw fixation is new compared to prior. The angulation on the L4 pedicle screws is variable however both pedicle screws do appear located within the pedicles and terminate in the vertebral body  despite the cranial and caudal angulation. LEFT L4 transverse process fracture is nondisplaced. No hardware failure or disconnection is identified. The interbody bone graft seen on the prior myelogram is unchanged, low with subsidence into the superior L5 endplate.  Interval bilateral L4 laminotomies and resection of the inferior portion of the spinous process. Bilateral foraminotomies. L5 laminectomy.  The LEFT L5 pedicle screw traverses the pedicle. The hardware produces substantial artifact. There is lucency through the LEFT pedicle, which raises the possibility of a nondisplaced fracture of the pedicle however with the artifact, this is not a conclusive determination. The RIGHT pedicle screws appropriately located.  The L5-S1 central canal has been decompressed with the laminectomy. A portion  of the RIGHT L5 lamina remains present. Foraminotomies has been performed along with discectomy. Interbody bone graft is present which appears located within the disc space. Anterolisthesis of L5 on S1 is unchanged compared to the prior exam. S1 laminectomy. The S1 pedicle screws are seated the do extend through the anterior cortex of the S1 body. There is no intrusion on the lateral recesses or foramina.  Significantly, there is no endplate osteolysis to suggest osteomyelitis although this is earlier than any changes would be seen typically. Posterior lateral more slated bone graft is present. There is no gas in the paraspinal soft tissues to suggest infection with a gas-forming organism.  IMPRESSION: Revision L4 through S1 posterior lumbar interbody fusion. Bilateral L4 laminotomies with L5 and S1 laminectomy. Fluid collection in the posterior subcutaneous soft tissues may represent postoperative seroma, hematoma, abscess. Spinal leak is in the differential considerations and this is suboptimally evaluated in the absence of contrast. No hardware failure. No hardware malposition.   Electronically Signed   By: Andreas Newport M.D.   On: 07/18/2014 21:06   Dg C-arm Gt 120 Min  07/03/2014   CLINICAL DATA:  Removal Affix Spinous Process Plate (NuVasive) L4-5, L5 Gill Procedure, Left L5-S1 TLIF (Depuy), L4-S1 Pedicle Instrumentation (Depuy), L4-S1 Lateral Fusion,  EXAM: DG C-ARM GT 120 MIN; LUMBAR SPINE - 2-3 VIEW  FLUOROSCOPY TIME:  If the device does not provide the exposure index:  Fluoroscopy Time (in minutes and seconds):  50 seconds  Number of Acquired Images:  2  COMPARISON:  None  FINDINGS: Posterior lumbar interbody fusion with interbody spacer devices at L4-L5 and L5-S1. Posterior tissue retractors are present. Bilateral pedicle screws at each level with horizontal stabilizer bars.  IMPRESSION: Posterior lumbar interbody fusion L4-S1.   Electronically Signed   By: Elige Ko   On: 07/03/2014 12:22        Discharge Plan:  discharge to home  Disposition:     Signed: Naida Sleight  [facility] 07/30/2014, 5:18 PM

## 2014-07-30 NOTE — Progress Notes (Signed)
Subjective:    Patient ID: Kara [name], female    DOB: 07-23-1955, 59 y.o.   MRN: [id number]  HPI Patient is here to discuss pain management issues. We've had no role in managing her chronic pain. She has rheumatoid arthritis and fibromyalgia and chronic back pain. She had recent back surgery for sciatica and states that pain is greatly improved. She was on 3 drug regimen of fentanyl, dilaudid, and Percocet and apparently taken off all these and placed on Nucynta recently. Patient states she's had hallucinations on Nucynta and his had tremendous fatigue and anxiety symptoms since stopping these medications. She [facility]. She is requesting whether she get in sooner. She states she is place calls but has not received any return call. She denies any agitation.  Past Medical History  Diagnosis Date  . Fibromyalgia   . 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 Arthritis   . Rheumatoid arthritis(714.0)     autoimmune arthritis; methotrexate once/week  . Depression     takes meds daily  . Post-nasal drip     hx of  . Other specified rheumatoid arthritis, right shoulder 08/01/2011  . Asthma   . Allergy   . Chronic kidney disease   . Urinary incontinence   . Family history of malignant neoplasm of breast   . Chronic pain     [facility]  . Cellulitis of breast 11/2013    RIGHT BREAST  . Anxiety   . ADD (attention deficit disorder)     on Adderal  . Chronic fatigue and immune dysfunction syndrome    Past Surgical History  Procedure Laterality Date  . Shoulder surgery  2012    right  . Back surgery  2011    lower back fusion l4-5, s1  . Bladder surgery  2009  . Tubal ligation  1988  . Breast enhancement surgery  2003  . Liposuction  2003    abdominal  . Tonsillectomy  1987  . Total shoulder arthroplasty  08/01/2011    Procedure: TOTAL SHOULDER ARTHROPLASTY;  Surgeon:  Eulas Post, MD;  Location: [facility];  Service: Orthopedics;  Laterality: Right;  Right total shoulder arthroplasty  . Mass excision  11/03/2011    Procedure: MINOR EXCISION OF MASS;  Surgeon: Wyn Forster., MD;  Location: [facility];  Service: Orthopedics;  Laterality: Left;  debride IP joint, cyst excision left index  . Breast surgery  10/2013    REMOVAL OF BREAST IMPLANTS  . Breast implants removed    . Incision and drainage abscess Right 01/16/2014    Procedure: INCISION AND DRAINAGE AND OF RIGHT BREAST ABCESS;  Surgeon: Glenna Fellows, MD;  Location: [facility];  Service: General;  Laterality: Right;    reports that she has quit smoking. Her smoking use included Cigarettes. She has a 5 pack-year smoking history. She has never used smokeless tobacco. She reports that she does not drink alcohol or use illicit drugs. family history includes Arthritis in her mother; Breast cancer (age of onset: 37) in her maternal aunt; Breast cancer (age of onset: 61) in her mother; COPD in her father; Cancer (age of onset: 20) in her cousin; Colon cancer (age of onset: 27) in her paternal aunt; Heart disease in her mother; Hypertension in her father; Stomach cancer (age of onset: 25) in her paternal uncle. Allergies  Allergen  Reactions  . Aspirin Anaphylaxis  . Ibuprofen Anaphylaxis  . Ketamine Other (See Comments)    Hallucinations  . Nucynta [Tapentadol] Other (See Comments)    hallucinations  . Ativan [Lorazepam] Other (See Comments)    Makes agitated, combative   . Tramadol Hcl Itching, Swelling and Rash      Review of Systems  Constitutional: Positive for fatigue. Negative for fever and chills.  Respiratory: Negative for shortness of breath.   Cardiovascular: Negative for chest pain.  Psychiatric/Behavioral: Negative for confusion and agitation.       Objective:   Physical Exam  Constitutional: She appears well-developed and well-nourished.  Cardiovascular: Normal rate  and regular rhythm.   Pulmonary/Chest: Effort normal and breath sounds normal. No respiratory distress. She has no wheezes.  Neurological: She is alert.  Psychiatric: She has a normal mood and affect.          Assessment & Plan:  Patient has chronic pain related to rheumatoid arthritis, chronic low back pain, and fibromyalgia. We explained that we could not in any way engage in tapering off her pain medicines and that this needs to be through chronic pain management. She will follow-up with them as scheduled and try to get in for sooner follow-up if possible

## 2014-07-30 NOTE — Progress Notes (Signed)
Pre visit review using our clinic review tool, if applicable. No additional management support is needed unless otherwise documented below in the visit note. 

## 2014-09-03 ENCOUNTER — Telehealth: Payer: Self-pay | Admitting: Family Medicine

## 2014-09-03 DIAGNOSIS — M549 Dorsalgia, unspecified: Secondary | ICD-10-CM

## 2014-09-03 DIAGNOSIS — M069 Rheumatoid arthritis, unspecified: Secondary | ICD-10-CM

## 2014-09-03 NOTE — Telephone Encounter (Signed)
Referral placed and patient is aware. 

## 2014-09-03 NOTE — Telephone Encounter (Signed)
Okay to refer? 

## 2014-09-03 NOTE — Telephone Encounter (Signed)
OK 

## 2014-09-03 NOTE — Telephone Encounter (Signed)
Pt would like a referral to [facility] for RA,back operations and fusion. Please do not put on referral fibromyalgia pain clinic will not accept her with that dx/condition. Fax order to Tanelle Cower [phone] and phone # [phone]. Can we refer?

## 2014-11-12 ENCOUNTER — Other Ambulatory Visit: Payer: Self-pay | Admitting: *Deleted

## 2014-11-12 MED ORDER — MELOXICAM 15 MG PO TABS
ORAL_TABLET | ORAL | Status: DC
Start: 2014-11-12 — End: 2015-11-22

## 2014-11-16 ENCOUNTER — Other Ambulatory Visit: Payer: Self-pay | Admitting: *Deleted

## 2014-11-16 NOTE — Telephone Encounter (Signed)
Patient is requesting a refill of Tretinoin Cream 0.1% (45gm) [facility]

## 2014-11-17 MED ORDER — TRETINOIN-CLEANSER-MOISTURIZER 0.1 % CREAM EX KIT: PACK | CUTANEOUS | Status: DC

## 2014-11-17 MED ORDER — TRETINOIN 0.1 % EX CREA: 1.0000 "application " | TOPICAL_CREAM | Freq: Every day | CUTANEOUS | Status: DC

## 2014-11-17 NOTE — Telephone Encounter (Signed)
Rx sent to pharmacy   

## 2014-11-17 NOTE — Addendum Note (Signed)
Addended by: Shelby Dubin E on: 11/17/2014 12:01 PM   Modules accepted: Orders, Medications

## 2014-11-17 NOTE — Telephone Encounter (Signed)
i dont see that patient has been on this medication before.

## 2014-11-17 NOTE — Telephone Encounter (Signed)
She has used this previously-under historical medications. Refill once

## 2014-11-20 ENCOUNTER — Telehealth: Payer: Self-pay | Admitting: *Deleted

## 2014-11-20 NOTE — Telephone Encounter (Signed)
[facility] service is requesting medication clarification. They received one script for Retin A and one for Tretin X .They want to know which one to fill . Please review. Call [phone] option 1 to clarify

## 2014-11-20 NOTE — Telephone Encounter (Signed)
Mail order informed.

## 2015-01-13 ENCOUNTER — Telehealth: Payer: Self-pay | Admitting: Family Medicine

## 2015-01-13 NOTE — Telephone Encounter (Signed)
Pt need rx Tretinoin-Cleanser-Moisturizer (TRETIN-X) 0.1 % CREAM KIT [facility] to  [facility]   (not [facility])  Pt also would like dr burchette to prescribe "ADDY" Pt states she has no more hormones left.

## 2015-01-14 MED ORDER — TRETINOIN-CLEANSER-MOISTURIZER 0.1 % CREAM EX KIT: PACK | CUTANEOUS | Status: DC

## 2015-01-14 NOTE — Telephone Encounter (Signed)
May refill topical cream.  Would not prescribe ADDY without office follow up to discuss pros and cons.

## 2015-01-14 NOTE — Telephone Encounter (Signed)
Left message on machine for patient and Rx sent to pharmacy.

## 2015-01-15 ENCOUNTER — Telehealth: Payer: Self-pay | Admitting: Family Medicine

## 2015-01-15 NOTE — Telephone Encounter (Signed)
Please advise 

## 2015-01-15 NOTE — Telephone Encounter (Signed)
[facility] the Tretin-X is on backorder. Suggests that you send in plain and advise pt to use a moisturizer.

## 2015-01-17 NOTE — Telephone Encounter (Signed)
OK to send in Retin A cream.  We do not have information what % cream she was using with the Tretin-X.  Would send in the same % with the Retin A.

## 2015-01-19 MED ORDER — TRETINOIN 0.1 % EX CREA: 1.0000 "application " | TOPICAL_CREAM | Freq: Every day | CUTANEOUS | Status: DC

## 2015-01-19 NOTE — Telephone Encounter (Signed)
Rx was sent to the pharmacy  

## 2015-01-22 ENCOUNTER — Other Ambulatory Visit: Payer: Self-pay | Admitting: Orthopaedic Surgery

## 2015-01-22 DIAGNOSIS — R200 Anesthesia of skin: Secondary | ICD-10-CM

## 2015-01-22 DIAGNOSIS — M542 Cervicalgia: Secondary | ICD-10-CM

## 2015-01-26 ENCOUNTER — Ambulatory Visit
Admission: RE | Admit: 2015-01-26 | Discharge: 2015-01-26 | Disposition: A | Discharge status: Home / Self Care | Payer: Managed Care, Other (non HMO) | Source: Ambulatory Visit | Attending: Orthopaedic Surgery | Admitting: Orthopaedic Surgery

## 2015-01-26 DIAGNOSIS — M542 Cervicalgia: Secondary | ICD-10-CM

## 2015-01-26 DIAGNOSIS — R200 Anesthesia of skin: Secondary | ICD-10-CM

## 2015-02-05 ENCOUNTER — Other Ambulatory Visit: Payer: Self-pay | Admitting: Family Medicine

## 2015-02-10 LAB — HM MAMMOGRAPHY: HM Mammogram: NORMAL

## 2015-02-12 ENCOUNTER — Ambulatory Visit: Payer: Self-pay | Admitting: Family Medicine

## 2015-02-12 DIAGNOSIS — Z0289 Encounter for other administrative examinations: Secondary | ICD-10-CM

## 2015-02-16 ENCOUNTER — Other Ambulatory Visit (HOSPITAL_COMMUNITY): Payer: Self-pay | Admitting: Orthopaedic Surgery

## 2015-02-22 ENCOUNTER — Ambulatory Visit (HOSPITAL_COMMUNITY)
Admission: RE | Admit: 2015-02-22 | Payer: Managed Care, Other (non HMO) | Source: Ambulatory Visit | Admitting: Orthopaedic Surgery

## 2015-02-22 ENCOUNTER — Encounter (HOSPITAL_COMMUNITY): Admission: RE | Payer: Self-pay | Source: Ambulatory Visit

## 2015-02-22 ENCOUNTER — Encounter: Payer: Self-pay | Admitting: Family Medicine

## 2015-02-22 SURGERY — ANTERIOR CERVICAL DECOMPRESSION/DISCECTOMY FUSION 2 LEVELS
Anesthesia: General

## 2015-03-03 ENCOUNTER — Encounter: Payer: Self-pay | Admitting: Obstetrics and Gynecology

## 2015-03-08 ENCOUNTER — Encounter (HOSPITAL_COMMUNITY)
Admission: RE | Admit: 2015-03-08 | Discharge: 2015-03-08 | Disposition: A | Discharge status: Home / Self Care | Payer: Managed Care, Other (non HMO) | Source: Ambulatory Visit | Attending: Orthopaedic Surgery | Admitting: Orthopaedic Surgery

## 2015-03-08 ENCOUNTER — Encounter (HOSPITAL_COMMUNITY): Payer: Self-pay

## 2015-03-08 DIAGNOSIS — M47812 Spondylosis without myelopathy or radiculopathy, cervical region: Secondary | ICD-10-CM | POA: Diagnosis not present

## 2015-03-08 DIAGNOSIS — Z01812 Encounter for preprocedural laboratory examination: Secondary | ICD-10-CM | POA: Diagnosis not present

## 2015-03-08 LAB — COMPREHENSIVE METABOLIC PANEL
ALK PHOS: 102 U/L (ref 38–126)
ALT: 23 U/L (ref 14–54)
ANION GAP: 7 (ref 5–15)
AST: 30 U/L (ref 15–41)
Albumin: 4.5 g/dL (ref 3.5–5.0)
BILIRUBIN TOTAL: 0.6 mg/dL (ref 0.3–1.2)
BUN: 14 mg/dL (ref 6–20)
CALCIUM: 9.6 mg/dL (ref 8.9–10.3)
CO2: 27 mmol/L (ref 22–32)
Chloride: 103 mmol/L (ref 101–111)
Creatinine, Ser: 0.71 mg/dL (ref 0.44–1.00)
GFR calc Af Amer: >60 mL/min (ref >60)
GFR calc non Af Amer: >60 mL/min (ref >60)
Glucose, Bld: 79 mg/dL (ref 65–99)
Potassium: 3.8 mmol/L (ref 3.5–5.1)
SODIUM: 137 mmol/L (ref 135–145)
TOTAL PROTEIN: 7.7 g/dL (ref 6.5–8.1)

## 2015-03-08 LAB — URINE MICROSCOPIC-ADD ON

## 2015-03-08 LAB — CBC
HEMATOCRIT: 42.1 % (ref 36.0–46.0)
HEMOGLOBIN: 13.7 g/dL (ref 12.0–15.0)
MCH: 27.5 pg (ref 26.0–34.0)
MCHC: 32.5 g/dL (ref 30.0–36.0)
MCV: 84.5 fL (ref 78.0–100.0)
Platelets: 299 10*3/uL (ref 150–400)
RBC: 4.98 MIL/uL (ref 3.87–5.11)
RDW: 14 % (ref 11.5–15.5)
WBC: 9.1 10*3/uL (ref 4.0–10.5)

## 2015-03-08 LAB — URINALYSIS, ROUTINE W REFLEX MICROSCOPIC
Bilirubin Urine: NEGATIVE
GLUCOSE, UA: NEGATIVE mg/dL
Ketones, ur: NEGATIVE mg/dL
Nitrite: NEGATIVE
PROTEIN: NEGATIVE mg/dL
SPECIFIC GRAVITY, URINE: 1.022 (ref 1.005–1.030)
pH: 5.5 (ref 5.0–8.0)

## 2015-03-08 LAB — SURGICAL PCR SCREEN
MRSA, PCR: NEGATIVE
Staphylococcus aureus: NEGATIVE

## 2015-03-08 LAB — PROTIME-INR
INR: 1.03 (ref 0.00–1.49)
Prothrombin Time: 13.7 seconds (ref 11.6–15.2)

## 2015-03-08 NOTE — Progress Notes (Addendum)
Pt not here for her PAT appointment called pt - states she will be here in 5 mins

## 2015-03-08 NOTE — Pre-Procedure Instructions (Signed)
Kara [name]  03/08/2015      [id number] - [place], [place] - [address] AT [address] OF [address] [facility] [address] [place] [address] Phone: [phone] Fax: [phone]  [facility] - [place], [place] - [address] [place] [address] Phone: [phone] Fax: [phone]    Your procedure is scheduled on Dec 19  Report to [facility] Admitting at 1030 A.M.  Call this number if you have problems the morning of surgery:  [phone]   Remember:  Do not eat food or drink liquids after midnight.  Take these medicines the morning of surgery with A SIP OF WATER : Adderall, Clonidine (Catapres), venlafaxine XR (Effexor-XR), alprazolam (Xanax) if needed, Fetzima,   Stop taking aspirin, Ibuprofen, Aleve, Motrin, Advil, Herbal medications, Fish Oil, BC's, Goody's, Meloxicam (Mobic)   Do not wear jewelry, make-up or nail polish.  Do not wear lotions, powders, or perfumes.  You may wear deodorant.  Do not shave 48 hours prior to surgery.  Men may shave face and neck.  Do not bring valuables to the hospital.  [facility] is not responsible for any belongings or valuables.  Contacts, dentures or bridgework may not be worn into surgery.  Leave your suitcase in the car.  After surgery it may be brought to your room.  For patients admitted to the hospital, discharge time will be determined by your treatment team.  Patients discharged the day of surgery will not be allowed to drive home. Special instructions:  [place] - Preparing for Surgery  Before surgery, you can play an important role.  Because skin is not sterile, your skin needs to be as free of germs as possible.  You can reduce the number of germs on you skin by washing with CHG (chlorahexidine gluconate) soap before surgery.  CHG is an antiseptic cleaner which kills germs and bonds with the skin to continue killing germs even after  washing.  Please DO NOT use if you have an allergy to CHG or antibacterial soaps.  If your skin becomes reddened/irritated stop using the CHG and inform your nurse when you arrive at Short Stay.  Do not shave (including legs and underarms) for at least 48 hours prior to the first CHG shower.  You may shave your face.  Please follow these instructions carefully:   1.  Shower with CHG Soap the night before surgery and the   morning of Surgery.  2.  If you choose to wash your hair, wash your hair first as usual with your normal shampoo.  3.  After you shampoo, rinse your hair and body thoroughly to remove the  Shampoo.  4.  Use CHG as you would any other liquid soap.  You can apply chg directly   to the skin and wash gently with scrungie or a clean washcloth.  5.  Apply the CHG Soap to your body ONLY FROM THE NECK DOWN.     Do not use on open wounds or open sores.  Avoid contact with your eyes,   ears, mouth and genitals (private parts).  Wash genitals (private parts)   with your normal soap.  6.  Wash thoroughly, paying special attention to the area where your surgery  will be performed.  7.  Thoroughly rinse your body with warm water from the neck down.  8.  DO NOT shower/wash with your normal soap after using and rinsing off  the CHG Soap.  9.  Pat yourself dry with a clean towel.            10.  Wear clean pajamas.            11.  Place clean sheets on your bed the night of your first shower and do not  sleep with pets.  Day of Surgery  Do not apply any lotions/deoderants the morning of surgery.  Please wear clean clothes to the hospital/surgery center.     Please read over the following fact sheets that you were given. Pain Booklet, Coughing and Deep Breathing, MRSA Information and Surgical Site Infection Prevention

## 2015-03-08 NOTE — Progress Notes (Signed)
Arrived for PAT appointment.

## 2015-03-08 NOTE — Progress Notes (Addendum)
PCP is Dr. Marica Otter Pt states she saw a cardiologist many years ago (states maybe 30 years ago) she may have had a stress test and echo 30 years ago, but is not sure. She is not currently under the care of a cardiologist

## 2015-03-14 MED ORDER — CHLORHEXIDINE GLUCONATE 4 % EX LIQD: 60.0000 mL | Freq: Once | CUTANEOUS | Status: DC

## 2015-03-14 MED ORDER — CEFAZOLIN SODIUM-DEXTROSE 2-3 GM-% IV SOLR
2.0000 g | INTRAVENOUS | Status: AC
  Administered 2015-03-15: 2 g via INTRAVENOUS
  Filled 2015-03-14: qty 50

## 2015-03-15 ENCOUNTER — Encounter (HOSPITAL_COMMUNITY)
Admission: RE | Disposition: A | Discharge status: Home / Self Care | Payer: Self-pay | Source: Ambulatory Visit | Attending: Orthopaedic Surgery

## 2015-03-15 ENCOUNTER — Ambulatory Visit (HOSPITAL_COMMUNITY): Payer: Managed Care, Other (non HMO) | Admitting: Critical Care Medicine

## 2015-03-15 ENCOUNTER — Encounter (HOSPITAL_COMMUNITY): Payer: Self-pay | Admitting: Critical Care Medicine

## 2015-03-15 ENCOUNTER — Inpatient Hospital Stay (HOSPITAL_COMMUNITY)
Admission: RE | Admit: 2015-03-15 | Discharge: 2015-03-16 | DRG: 473 | Disposition: A | Discharge status: Home / Self Care | Payer: Managed Care, Other (non HMO) | Source: Ambulatory Visit | Attending: Orthopaedic Surgery | Admitting: Orthopaedic Surgery

## 2015-03-15 ENCOUNTER — Ambulatory Visit (HOSPITAL_COMMUNITY): Payer: Managed Care, Other (non HMO)

## 2015-03-15 DIAGNOSIS — M069 Rheumatoid arthritis, unspecified: Secondary | ICD-10-CM | POA: Diagnosis present

## 2015-03-15 DIAGNOSIS — Z87891 Personal history of nicotine dependence: Secondary | ICD-10-CM

## 2015-03-15 DIAGNOSIS — F329 Major depressive disorder, single episode, unspecified: Secondary | ICD-10-CM | POA: Diagnosis present

## 2015-03-15 DIAGNOSIS — Z981 Arthrodesis status: Secondary | ICD-10-CM | POA: Diagnosis not present

## 2015-03-15 DIAGNOSIS — Z96611 Presence of right artificial shoulder joint: Secondary | ICD-10-CM | POA: Diagnosis present

## 2015-03-15 DIAGNOSIS — Z419 Encounter for procedure for purposes other than remedying health state, unspecified: Secondary | ICD-10-CM

## 2015-03-15 DIAGNOSIS — M542 Cervicalgia: Secondary | ICD-10-CM | POA: Diagnosis present

## 2015-03-15 DIAGNOSIS — M47812 Spondylosis without myelopathy or radiculopathy, cervical region: Principal | ICD-10-CM | POA: Diagnosis present

## 2015-03-15 DIAGNOSIS — M797 Fibromyalgia: Secondary | ICD-10-CM | POA: Diagnosis present

## 2015-03-15 HISTORY — PX: ANTERIOR CERVICAL DECOMP/DISCECTOMY FUSION: SHX1161

## 2015-03-15 SURGERY — ANTERIOR CERVICAL DECOMPRESSION/DISCECTOMY FUSION 2 LEVELS
Anesthesia: General

## 2015-03-15 MED ORDER — LEVOMILNACIPRAN HCL ER 120 MG PO CP24: 120.0000 mg | ORAL_CAPSULE | Freq: Every day | ORAL | Status: DC

## 2015-03-15 MED ORDER — THROMBIN 20000 UNITS EX SOLR
CUTANEOUS | Status: DC | PRN
  Administered 2015-03-15: 20000 [IU] via TOPICAL

## 2015-03-15 MED ORDER — METHOCARBAMOL 500 MG PO TABS
500.0000 mg | ORAL_TABLET | Freq: Four times a day (QID) | ORAL | Status: DC | PRN
  Administered 2015-03-15 – 2015-03-16 (×2): 500 mg via ORAL
  Filled 2015-03-15 (×2): qty 1

## 2015-03-15 MED ORDER — POLYETHYLENE GLYCOL 3350 17 G PO PACK: 17.0000 g | PACK | Freq: Every day | ORAL | Status: DC | PRN

## 2015-03-15 MED ORDER — OXYCODONE-ACETAMINOPHEN 5-325 MG PO TABS
2.0000 | ORAL_TABLET | Freq: Four times a day (QID) | ORAL | Status: DC | PRN
  Administered 2015-03-15 – 2015-03-16 (×2): 2 via ORAL
  Filled 2015-03-15 (×2): qty 2

## 2015-03-15 MED ORDER — MIDAZOLAM HCL 2 MG/2ML IJ SOLN
INTRAMUSCULAR | Status: AC
  Filled 2015-03-15: qty 2

## 2015-03-15 MED ORDER — OXYCODONE-ACETAMINOPHEN 5-325 MG PO TABS
1.0000 | ORAL_TABLET | Freq: Four times a day (QID) | ORAL | Status: DC | PRN
  Administered 2015-03-15: 1 via ORAL
  Filled 2015-03-15: qty 1

## 2015-03-15 MED ORDER — ACETAMINOPHEN 325 MG PO TABS: 325.0000 mg | ORAL_TABLET | ORAL | Status: DC | PRN

## 2015-03-15 MED ORDER — PHENOL 1.4 % MT LIQD: 1.0000 | OROMUCOSAL | Status: DC | PRN

## 2015-03-15 MED ORDER — HYDROMORPHONE HCL 1 MG/ML IJ SOLN
0.2500 mg | INTRAMUSCULAR | Status: DC | PRN
  Administered 2015-03-15 (×4): 0.5 mg via INTRAVENOUS

## 2015-03-15 MED ORDER — ACETAMINOPHEN 160 MG/5ML PO SOLN
325.0000 mg | ORAL | Status: DC | PRN
  Filled 2015-03-15: qty 20.3

## 2015-03-15 MED ORDER — DEXAMETHASONE SODIUM PHOSPHATE 10 MG/ML IJ SOLN
INTRAMUSCULAR | Status: AC
  Filled 2015-03-15: qty 1

## 2015-03-15 MED ORDER — HYDROMORPHONE HCL 1 MG/ML IJ SOLN
1.0000 mg | INTRAMUSCULAR | Status: AC | PRN
  Administered 2015-03-15 (×2): 1 mg via INTRAVENOUS

## 2015-03-15 MED ORDER — SODIUM CHLORIDE 0.9 % IJ SOLN
3.0000 mL | Freq: Two times a day (BID) | INTRAMUSCULAR | Status: DC
  Administered 2015-03-15: 3 mL via INTRAVENOUS

## 2015-03-15 MED ORDER — HYDROMORPHONE HCL 1 MG/ML IJ SOLN
INTRAMUSCULAR | Status: AC
  Filled 2015-03-15: qty 2

## 2015-03-15 MED ORDER — SODIUM CHLORIDE 0.9 % IJ SOLN: 3.0000 mL | INTRAMUSCULAR | Status: DC | PRN

## 2015-03-15 MED ORDER — MIDAZOLAM HCL 5 MG/5ML IJ SOLN
INTRAMUSCULAR | Status: DC | PRN
  Administered 2015-03-15: 2 mg via INTRAVENOUS

## 2015-03-15 MED ORDER — GLYCOPYRROLATE 0.2 MG/ML IJ SOLN
INTRAMUSCULAR | Status: AC
  Filled 2015-03-15: qty 2

## 2015-03-15 MED ORDER — LACTATED RINGERS IV SOLN
INTRAVENOUS | Status: DC
  Administered 2015-03-15 (×2): via INTRAVENOUS

## 2015-03-15 MED ORDER — FENTANYL CITRATE (PF) 250 MCG/5ML IJ SOLN
INTRAMUSCULAR | Status: AC
  Filled 2015-03-15: qty 5

## 2015-03-15 MED ORDER — PROPOFOL 10 MG/ML IV BOLUS
INTRAVENOUS | Status: DC | PRN
  Administered 2015-03-15: 150 mg via INTRAVENOUS
  Administered 2015-03-15: 50 mg via INTRAVENOUS

## 2015-03-15 MED ORDER — DOCUSATE SODIUM 100 MG PO CAPS
100.0000 mg | ORAL_CAPSULE | Freq: Two times a day (BID) | ORAL | Status: DC
  Administered 2015-03-15: 100 mg via ORAL

## 2015-03-15 MED ORDER — SUGAMMADEX SODIUM 200 MG/2ML IV SOLN
INTRAVENOUS | Status: AC
  Filled 2015-03-15: qty 2

## 2015-03-15 MED ORDER — SODIUM CHLORIDE 0.9 % IV SOLN: 250.0000 mL | INTRAVENOUS | Status: DC

## 2015-03-15 MED ORDER — FENTANYL CITRATE (PF) 100 MCG/2ML IJ SOLN
INTRAMUSCULAR | Status: DC | PRN
  Administered 2015-03-15: 200 ug via INTRAVENOUS
  Administered 2015-03-15: 10 ug via INTRAVENOUS
  Administered 2015-03-15: 50 ug via INTRAVENOUS

## 2015-03-15 MED ORDER — MENTHOL 3 MG MT LOZG: 1.0000 | LOZENGE | OROMUCOSAL | Status: DC | PRN

## 2015-03-15 MED ORDER — ALPRAZOLAM 0.5 MG PO TABS
1.0000 mg | ORAL_TABLET | Freq: Two times a day (BID) | ORAL | Status: DC | PRN
  Administered 2015-03-15: 1 mg via ORAL
  Filled 2015-03-15: qty 2

## 2015-03-15 MED ORDER — ACETAMINOPHEN 325 MG PO TABS: 650.0000 mg | ORAL_TABLET | ORAL | Status: DC | PRN

## 2015-03-15 MED ORDER — LIDOCAINE HCL (CARDIAC) 20 MG/ML IV SOLN
INTRAVENOUS | Status: DC | PRN
  Administered 2015-03-15: 60 mg via INTRAVENOUS

## 2015-03-15 MED ORDER — PHENYLEPHRINE HCL 10 MG/ML IJ SOLN
INTRAMUSCULAR | Status: DC | PRN
  Administered 2015-03-15: 80 ug via INTRAVENOUS
  Administered 2015-03-15: 40 ug via INTRAVENOUS
  Administered 2015-03-15 (×2): 80 ug via INTRAVENOUS
  Administered 2015-03-15: 120 ug via INTRAVENOUS

## 2015-03-15 MED ORDER — AMPHETAMINE-DEXTROAMPHETAMINE 30 MG PO TABS
30.0000 mg | ORAL_TABLET | Freq: Three times a day (TID) | ORAL | Status: DC
  Filled 2015-03-15 (×3): qty 1

## 2015-03-15 MED ORDER — SUGAMMADEX SODIUM 200 MG/2ML IV SOLN
INTRAVENOUS | Status: DC | PRN
  Administered 2015-03-15: 200 mg via INTRAVENOUS

## 2015-03-15 MED ORDER — CEFAZOLIN SODIUM 1-5 GM-% IV SOLN
1.0000 g | Freq: Three times a day (TID) | INTRAVENOUS | Status: AC
  Administered 2015-03-15 – 2015-03-16 (×2): 1 g via INTRAVENOUS
  Filled 2015-03-15 (×3): qty 50

## 2015-03-15 MED ORDER — NEOSTIGMINE METHYLSULFATE 10 MG/10ML IV SOLN
INTRAVENOUS | Status: AC
  Filled 2015-03-15: qty 1

## 2015-03-15 MED ORDER — BUPIVACAINE-EPINEPHRINE 0.25% -1:200000 IJ SOLN
INTRAMUSCULAR | Status: DC | PRN
  Administered 2015-03-15: 6 mg

## 2015-03-15 MED ORDER — OXYCODONE HCL 5 MG PO TABS
5.0000 mg | ORAL_TABLET | Freq: Once | ORAL | Status: AC | PRN
  Administered 2015-03-15: 5 mg via ORAL

## 2015-03-15 MED ORDER — ONDANSETRON HCL 4 MG/2ML IJ SOLN: 4.0000 mg | INTRAMUSCULAR | Status: DC | PRN

## 2015-03-15 MED ORDER — OXYCODONE HCL 5 MG PO TABS
ORAL_TABLET | ORAL | Status: AC
  Filled 2015-03-15: qty 1

## 2015-03-15 MED ORDER — GELATIN ABSORBABLE 50 EX MISC
CUTANEOUS | Status: DC | PRN
  Administered 2015-03-15: 1 via TOPICAL

## 2015-03-15 MED ORDER — ONDANSETRON HCL 4 MG/2ML IJ SOLN
INTRAMUSCULAR | Status: DC | PRN
  Administered 2015-03-15: 4 mg via INTRAVENOUS

## 2015-03-15 MED ORDER — POTASSIUM CHLORIDE IN NACL 20-0.45 MEQ/L-% IV SOLN
INTRAVENOUS | Status: DC
  Administered 2015-03-15: 20:00:00 via INTRAVENOUS
  Filled 2015-03-15 (×4): qty 1000

## 2015-03-15 MED ORDER — HYDROMORPHONE HCL 1 MG/ML IJ SOLN
0.5000 mg | INTRAMUSCULAR | Status: DC | PRN
  Administered 2015-03-15 – 2015-03-16 (×6): 1 mg via INTRAVENOUS
  Filled 2015-03-15 (×6): qty 1

## 2015-03-15 MED ORDER — SUCCINYLCHOLINE CHLORIDE 20 MG/ML IJ SOLN
INTRAMUSCULAR | Status: DC | PRN
  Administered 2015-03-15: 100 mg via INTRAVENOUS

## 2015-03-15 MED ORDER — ACETAMINOPHEN 650 MG RE SUPP: 650.0000 mg | RECTAL | Status: DC | PRN

## 2015-03-15 MED ORDER — 0.9 % SODIUM CHLORIDE (POUR BTL) OPTIME
TOPICAL | Status: DC | PRN
  Administered 2015-03-15: 1000 mL

## 2015-03-15 MED ORDER — DEXTROSE 5 % IV SOLN
500.0000 mg | INTRAVENOUS | Status: AC
  Administered 2015-03-15: 500 mg via INTRAVENOUS
  Filled 2015-03-15: qty 5

## 2015-03-15 MED ORDER — ROCURONIUM BROMIDE 100 MG/10ML IV SOLN
INTRAVENOUS | Status: DC | PRN
  Administered 2015-03-15: 30 mg via INTRAVENOUS

## 2015-03-15 MED ORDER — OXYCODONE HCL 5 MG/5ML PO SOLN: 5.0000 mg | Freq: Once | ORAL | Status: AC | PRN

## 2015-03-15 MED ORDER — VENLAFAXINE HCL ER 150 MG PO CP24
150.0000 mg | ORAL_CAPSULE | Freq: Every day | ORAL | Status: DC
  Administered 2015-03-16: 150 mg via ORAL
  Filled 2015-03-15: qty 1

## 2015-03-15 MED ORDER — PHENYLEPHRINE 40 MCG/ML (10ML) SYRINGE FOR IV PUSH (FOR BLOOD PRESSURE SUPPORT)
PREFILLED_SYRINGE | INTRAVENOUS | Status: AC
  Filled 2015-03-15: qty 10

## 2015-03-15 MED ORDER — METHOCARBAMOL 1000 MG/10ML IJ SOLN: 500.0000 mg | Freq: Four times a day (QID) | INTRAVENOUS | Status: DC | PRN

## 2015-03-15 MED ORDER — BUPIVACAINE-EPINEPHRINE (PF) 0.25% -1:200000 IJ SOLN
INTRAMUSCULAR | Status: AC
  Filled 2015-03-15: qty 30

## 2015-03-15 MED ORDER — ZOLPIDEM TARTRATE 5 MG PO TABS
10.0000 mg | ORAL_TABLET | Freq: Every day | ORAL | Status: DC
  Administered 2015-03-15: 10 mg via ORAL
  Filled 2015-03-15: qty 2

## 2015-03-15 MED ORDER — DEXAMETHASONE SODIUM PHOSPHATE 10 MG/ML IJ SOLN
INTRAMUSCULAR | Status: DC | PRN
  Administered 2015-03-15: 10 mg via INTRAVENOUS

## 2015-03-15 MED ORDER — THROMBIN 20000 UNITS EX SOLR
CUTANEOUS | Status: AC
  Filled 2015-03-15: qty 20000

## 2015-03-15 SURGICAL SUPPLY — 54 items
BENZOIN TINCTURE PRP APPL 2/3 (GAUZE/BANDAGES/DRESSINGS) ×2 IMPLANT
BIT DRILL SKYLINE 12MM (BIT) ×1 IMPLANT
BLADE SURG ROTATE 9660 (MISCELLANEOUS) IMPLANT
BONE CERV LORDOTIC 14.5X12X6 (Bone Implant) ×2 IMPLANT
BONE CERV LORDOTIC 14.5X12X7 (Bone Implant) ×2 IMPLANT
BUR ROUND FLUTED 4 SOFT TCH (BURR) IMPLANT
CLSR STERI-STRIP ANTIMIC 1/2X4 (GAUZE/BANDAGES/DRESSINGS) ×2 IMPLANT
COLLAR CERV LO CONTOUR FIRM DE (SOFTGOODS) ×2 IMPLANT
CORDS BIPOLAR (ELECTRODE) ×2 IMPLANT
COVER SURGICAL LIGHT HANDLE (MISCELLANEOUS) ×2 IMPLANT
CRADLE DONUT ADULT HEAD (MISCELLANEOUS) ×2 IMPLANT
DRAPE C-ARM 42X72 X-RAY (DRAPES) ×2 IMPLANT
DRAPE MICROSCOPE LEICA (MISCELLANEOUS) ×2 IMPLANT
DRAPE PROXIMA HALF (DRAPES) ×2 IMPLANT
DRILL BIT SKYLINE 12MM (BIT) ×1
DURAPREP 6ML APPLICATOR 50/CS (WOUND CARE) ×2 IMPLANT
ELECT COATED BLADE 2.86 ST (ELECTRODE) ×2 IMPLANT
ELECT REM PT RETURN 9FT ADLT (ELECTROSURGICAL) ×2
ELECTRODE REM PT RTRN 9FT ADLT (ELECTROSURGICAL) ×1 IMPLANT
EVACUATOR 1/8 PVC DRAIN (DRAIN) ×2 IMPLANT
GAUZE SPONGE 4X4 12PLY STRL (GAUZE/BANDAGES/DRESSINGS) ×2 IMPLANT
GAUZE XEROFORM 1X8 LF (GAUZE/BANDAGES/DRESSINGS) IMPLANT
GLOVE BIOGEL PI IND STRL 8 (GLOVE) ×2 IMPLANT
GLOVE BIOGEL PI INDICATOR 8 (GLOVE) ×2
GLOVE ORTHO TXT STRL SZ7.5 (GLOVE) ×4 IMPLANT
GOWN STRL REUS W/ TWL LRG LVL3 (GOWN DISPOSABLE) ×1 IMPLANT
GOWN STRL REUS W/ TWL XL LVL3 (GOWN DISPOSABLE) ×1 IMPLANT
GOWN STRL REUS W/TWL 2XL LVL3 (GOWN DISPOSABLE) ×2 IMPLANT
GOWN STRL REUS W/TWL LRG LVL3 (GOWN DISPOSABLE) ×1
GOWN STRL REUS W/TWL XL LVL3 (GOWN DISPOSABLE) ×1
HEAD HALTER (SOFTGOODS) ×2 IMPLANT
HEMOSTAT SURGICEL 2X14 (HEMOSTASIS) IMPLANT
KIT BASIN OR (CUSTOM PROCEDURE TRAY) ×2 IMPLANT
KIT ROOM TURNOVER OR (KITS) ×2 IMPLANT
MANIFOLD NEPTUNE II (INSTRUMENTS) IMPLANT
NEEDLE 25GX 5/8IN NON SAFETY (NEEDLE) ×2 IMPLANT
NS IRRIG 1000ML POUR BTL (IV SOLUTION) ×2 IMPLANT
PACK ORTHO CERVICAL (CUSTOM PROCEDURE TRAY) ×2 IMPLANT
PAD ARMBOARD 7.5X6 YLW CONV (MISCELLANEOUS) ×4 IMPLANT
PATTIES SURGICAL .5 X.5 (GAUZE/BANDAGES/DRESSINGS) IMPLANT
PLATE SKYLINE TWO LEVEL 28MM (Plate) ×8 IMPLANT
RESTRAINT LIMB HOLDER UNIV (RESTRAINTS) IMPLANT
SCREW VARIABLE SELF TAP 12MM (Screw) ×12 IMPLANT
SPONGE GAUZE 4X4 12PLY STER LF (GAUZE/BANDAGES/DRESSINGS) ×2 IMPLANT
STRIP CLOSURE SKIN 1/2X4 (GAUZE/BANDAGES/DRESSINGS) ×2 IMPLANT
SURGIFLO W/THROMBIN 8M KIT (HEMOSTASIS) IMPLANT
SUT BONE WAX W31G (SUTURE) ×2 IMPLANT
SUT VIC AB 3-0 X1 27 (SUTURE) ×2 IMPLANT
SUT VICRYL 4-0 PS2 18IN ABS (SUTURE) ×4 IMPLANT
SYR 30ML SLIP (SYRINGE) ×2 IMPLANT
TOWEL OR 17X24 6PK STRL BLUE (TOWEL DISPOSABLE) ×2 IMPLANT
TOWEL OR 17X26 10 PK STRL BLUE (TOWEL DISPOSABLE) ×2 IMPLANT
TRAY FOLEY CATH 16FR SILVER (SET/KITS/TRAYS/PACK) IMPLANT
WATER STERILE IRR 1000ML POUR (IV SOLUTION) ×2 IMPLANT

## 2015-03-15 NOTE — Anesthesia Preprocedure Evaluation (Signed)
Anesthesia Evaluation  Patient identified by MRN, date of birth, ID band Patient awake    Reviewed: Allergy & Precautions, NPO status , Patient's Chart, lab work & pertinent test results  History of Anesthesia Complications Negative for: history of anesthetic complications  Airway Mallampati: II  TM Distance: >3 FB Neck ROM: Full    Dental  (+) Teeth Intact   Pulmonary neg shortness of breath, neg sleep apnea, neg COPD, neg recent URI, former smoker, neg PE   breath sounds clear to auscultation       Cardiovascular negative cardio ROS   Rhythm:Regular     Neuro/Psych PSYCHIATRIC DISORDERS Anxiety Depression  Neuromuscular disease    GI/Hepatic negative GI ROS, Neg liver ROS,   Endo/Other  negative endocrine ROS  Renal/GU negative Renal ROS     Musculoskeletal  (+) Arthritis , Fibromyalgia -  Abdominal   Peds  Hematology negative hematology ROS (+)   Anesthesia Other Findings   Reproductive/Obstetrics                             Anesthesia Physical Anesthesia Plan  ASA: II  Anesthesia Plan: General   Post-op Pain Management:    Induction: Intravenous  Airway Management Planned: Oral ETT  Additional Equipment: None  Intra-op Plan:   Post-operative Plan: Extubation in OR  Informed Consent: I have reviewed the patients History and Physical, chart, labs and discussed the procedure including the risks, benefits and alternatives for the proposed anesthesia with the patient or authorized representative who has indicated his/her understanding and acceptance.   Dental advisory given  Plan Discussed with: CRNA and Surgeon  Anesthesia Plan Comments:         Anesthesia Quick Evaluation

## 2015-03-15 NOTE — H&P (Signed)
Kara [name] is an 59 y.o. female.   Patient returns and states she is continuing to have problems with moderate to severe neck pain, numbness in her hands.  She has to continue to shake her hands.  Tingling is on the radial side of both hands.  Whenever she turns her neck or has it in a flexed position, she states she has increased pain and numbness.  She has taken anti-inflammatories, had a prednisone dose pack, and has had persistent problems with this now for the last 2 years while she has been followed dating back to 2014, and she has had MRIs done in October 2014 as well as 01/26/2015 which are both available for review.  Patient states it bothers her on a daily basis.  She is just starting a new job and wants to get this taken care of.  She has been having to take Nucynta for pain management.  She has had a previous lumbar procedure done elsewhere.  Hardware had to be removed and had loose hardware, pseudarthrosis, and had a posterior fusion.  She states her back is feeling good.  Patient has bilateral brachial plexus tenderness, both right and left.  Patient has taken anti-inflammatories including meloxicam.  She has been on Effexor.  In the past she has had Fentanyl patch and Percocet.   PAST SURGICAL HISTORY:  She has had shoulder arthroplasty, sling procedure, lumbar hardware revision with re-fusion and has x-rays that demonstrate a solid lumbar fusion.   SOCIAL HISTORY:  She is married to her husband, Louretta Parma.   REVIEW OF SYSTEMS:  Positive for anxiety, arthritis, bladder problems, chronic narcotic usage, fibromyalgia, diagnosed rheumatoid arthritis, and depression.   .    Past Medical History  Diagnosis Date  . Fibromyalgia   . 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 Arthritis   . Rheumatoid arthritis(714.0)     autoimmune arthritis; methotrexate once/week  . Depression     takes meds daily  . Post-nasal drip     hx of  . Other specified  rheumatoid arthritis, right shoulder (HCC) 08/01/2011  . Allergy   . Chronic kidney disease   . Urinary incontinence   . Family history of malignant neoplasm of breast   . Chronic pain     [facility]  . Cellulitis of breast 11/2013    RIGHT BREAST  . Anxiety   . ADD (attention deficit disorder)     on Adderal  . Chronic fatigue and immune dysfunction syndrome   . Asthma     as a child    Past Surgical History  Procedure Laterality Date  . Shoulder surgery  2012    right  . Back surgery  2011    lower back fusion l4-5, s1  . Bladder surgery  2009  . Tubal ligation  1988  . Breast enhancement surgery  2003  . Liposuction  2003    abdominal  . Tonsillectomy  1987  . Total shoulder arthroplasty  08/01/2011    Procedure: TOTAL SHOULDER ARTHROPLASTY;  Surgeon: Eulas Post, MD;  Location: [facility] OR;  Service: Orthopedics;  Laterality: Right;  Right total shoulder arthroplasty  . Mass excision  11/03/2011    Procedure: MINOR EXCISION OF MASS;  Surgeon: Wyn Forster., MD;  Location: [facility];  Service: Orthopedics;  Laterality: Left;  debride IP joint, cyst excision left index  . Breast surgery  10/2013    REMOVAL OF BREAST IMPLANTS  . Breast implants removed    . Incision and drainage abscess Right 01/16/2014    Procedure: INCISION AND DRAINAGE AND OF RIGHT BREAST ABCESS;  Surgeon: Glenna Fellows, MD;  Location: [facility];  Service: General;  Laterality: Right;    Family History  Problem Relation Age of Onset  . Arthritis Mother   . Heart disease Mother     ?psvt  . Breast cancer Mother 23    TAH/BSO  . COPD Father   . Hypertension Father   . Breast cancer Maternal Aunt 27    deceased  . Cancer Cousin 33    female; unknown primary  . Colon cancer Paternal Aunt 63    deceased at 7  . Stomach cancer Paternal Uncle 41    deceased at 54   Social History:  reports that she has quit smoking. Her smoking use included  Cigarettes. She has a 5 pack-year smoking history. She has never used smokeless tobacco. She reports that she does not drink alcohol or use illicit drugs.  Allergies:  Allergies  Allergen Reactions  . Aspirin Anaphylaxis  . Ibuprofen Anaphylaxis  . Ketamine Other (See Comments)    Hallucinations  . Ativan [Lorazepam] Other (See Comments)    Makes agitated, combative   . Tramadol Hcl Itching, Swelling and Rash    No prescriptions prior to admission    No results found for this or any previous visit (from the past 48 hour(s)). No results found.  Review of Systems  Constitutional: Negative.   Eyes: Negative.   Respiratory: Negative.   Cardiovascular: Negative.   Gastrointestinal: Negative.   Genitourinary: Negative.   Musculoskeletal: Positive for neck pain.  Skin: Negative.   Neurological: Positive for tingling.  Psychiatric/Behavioral: Negative.     There were no vitals taken for this visit. Physical Exam  Constitutional: She is oriented to person, place, and time. No distress.  HENT:  Head: Atraumatic.  Eyes: EOM are normal.  Cardiovascular: Normal rate.   Respiratory: No respiratory distress.  GI: She exhibits no distension.  Musculoskeletal: She exhibits tenderness.  Neurological: She is alert and oriented to person, place, and time.  Skin: Skin is warm and dry.  Psychiatric: She has a normal mood and affect.    PHYSICAL EXAMINATION:  Patient is 5 feet 4 inches, 139 pounds.  Alert and oriented.  Pupils react to light.  Extraocular movements intact.  Bilateral brachial plexus tenderness.  No thenar or hypothenar weakness.  Upper extremity reflexes are 1+ and symmetrical.  Lower extremity reflexes are intact.  Lumbar incision is well healed.  Positive Spurling, right and left.  Negative Lhermitte.  No abdominal tenderness.  No audible wheezing.  Distal pulses are 2+.   IMAGING:  MRI of the cervical spine on 01/26/2015 shows progression of spondylosis at C5-6, loss of  disk space height, eccentric osteophyte complex with central stenosis but no mass effect on the cord.  She has severe left foraminal stenosis with mild to moderate right foraminal stenosis.  No significant change at C4-5 which shows mild changes, and C6-7 again shows spinal stenosis with mild cord effect.   PLAN:  We discussed options.  Procedure discussed, overnight stay.  She had multiple questions about pain medication.  She wanted to stay 2 nights so she would get more narcotics.  I discussed with her that 95% of patients just stay overnight; she should do well with this.  We discussed normal pain not being  severe.  She has had progressive stenosis with radicular symptoms, failed Medrol Dosepak, she has been through chiropractic treatments in the past, has been doing home exercises, and anti-inflammatories without relief.  Operative procedure discussed.  Risks of pseudarthrosis, dysphagia, dysphonia, need for posterior fusion, revision surgery Thula Stewart M 03/15/2015, 7:24 AM

## 2015-03-15 NOTE — Brief Op Note (Signed)
03/15/2015  2:49 PM  PATIENT:  Kara [name]  59 y.o. female  PRE-OPERATIVE DIAGNOSIS:  Cervical Spondylosis, Stenosis  POST-OPERATIVE DIAGNOSIS:  Cervical Spondylosis, Stenosis  PROCEDURE:  Procedure(s): Cervical five-six, Cerival six-seven, Anterior Cervical Discectomy and Fusion, Allograft and Plate (N/A)  SURGEON:  Surgeon(s) and Role:    Eldred Manges, MD - Primary  PHYSICIAN ASSISTANT: Deanna Boehlke m. Yobani Schertzer pa-c   ANESTHESIA:   general  EBL:  Total I/O In: 1500 [I.V.:1500] Out: -   BLOOD ADMINISTERED:none  DRAINS: none   LOCAL MEDICATIONS USED:  NONE  SPECIMEN:  No Specimen  DISPOSITION OF SPECIMEN:  N/A  COUNTS:  YES  TOURNIQUET:  * No tourniquets in log *  DICTATION: .Dragon Dictation  PLAN OF CARE: Admit to inpatient   PATIENT DISPOSITION:  PACU - hemodynamically stable.

## 2015-03-15 NOTE — Interval H&P Note (Signed)
History and Physical Interval Note:  03/15/2015 11:36 AM  Kara [name]  has presented today for surgery, with the diagnosis of Cervical Spondylosis, Stenosis  The various methods of treatment have been discussed with the patient and family. After consideration of risks, benefits and other options for treatment, the patient has consented to  Procedure(s): C5-6, C6-7 Anterior Cervical Discectomy and Fusion, Allograft and Plate (N/A) as a surgical intervention .  The patient's history has been reviewed, patient examined, no change in status, stable for surgery.  I have reviewed the patient's chart and labs.  Questions were answered to the patient's satisfaction.     Ernestene Coover C

## 2015-03-15 NOTE — Transfer of Care (Signed)
Immediate Anesthesia Transfer of Care Note  Patient: Kara [name]  Procedure(s) Performed: Procedure(s): Cervical five-six, Cerival six-seven, Anterior Cervical Discectomy and Fusion, Allograft and Plate (N/A)  Patient Location: PACU  Anesthesia Type:General  Level of Consciousness: awake, alert  and oriented  Airway & Oxygen Therapy: Patient Spontanous Breathing and Patient connected to nasal cannula oxygen  Post-op Assessment: Report given to RN and Post -op Vital signs reviewed and stable  Post vital signs: Reviewed and stable  Last Vitals:  Filed Vitals:   03/15/15 1003 03/15/15 1010  BP:  132/93  Pulse:  82  Temp: 37 C   Resp:  18    Complications: No apparent anesthesia complications

## 2015-03-15 NOTE — Anesthesia Procedure Notes (Signed)
Procedure Name: Intubation Date/Time: 03/15/2015 11:52 AM Performed by: Glo Herring B Pre-anesthesia Checklist: Patient identified, Emergency Drugs available, Suction available, Patient being monitored and Timeout performed Patient Re-evaluated:Patient Re-evaluated prior to inductionOxygen Delivery Method: Circle system utilized Preoxygenation: Pre-oxygenation with 100% oxygen Intubation Type: IV induction Ventilation: Mask ventilation without difficulty Grade View: Grade I Tube type: Oral Tube size: 7.0 mm Number of attempts: 1 Airway Equipment and Method: Video-laryngoscopy and Stylet Placement Confirmation: ETT inserted through vocal cords under direct vision,  positive ETCO2,  CO2 detector and breath sounds checked- equal and bilateral Secured at: 22 cm Tube secured with: Tape Dental Injury: Teeth and Oropharynx as per pre-operative assessment  Comments: Difficult airway in past - used bougie.  Easy intubation with glidescope.  Easy mask ventilation.

## 2015-03-15 NOTE — Progress Notes (Signed)
Pt states she took xanax at home today.  Expressing anxiety to nurses and was crying.  Dr Maple Hudson notified and did not want to give her anything but would come and talk with pt.  Pt also broke down with OR nurse.  Dr Maple Hudson in to talk with pt. No orders received.

## 2015-03-16 ENCOUNTER — Encounter (HOSPITAL_COMMUNITY): Payer: Self-pay | Admitting: Orthopaedic Surgery

## 2015-03-16 MED ORDER — OXYCODONE-ACETAMINOPHEN 5-325 MG PO TABS: 2.0000 | ORAL_TABLET | Freq: Four times a day (QID) | ORAL | Status: DC | PRN

## 2015-03-16 NOTE — Anesthesia Postprocedure Evaluation (Signed)
Anesthesia Post Note  Patient: Kara [name]  Procedure(s) Performed: Procedure(s) (LRB): Cervical five-six, Cerival six-seven, Anterior Cervical Discectomy and Fusion, Allograft and Plate (N/A)  Patient location during evaluation: PACU Anesthesia Type: General Pain management: pain level controlled Vital Signs Assessment: post-procedure vital signs reviewed and stable Respiratory status: spontaneous breathing Cardiovascular status: stable Postop Assessment: no signs of nausea or vomiting Anesthetic complications: no    Last Vitals:  Filed Vitals:   03/16/15 0033 03/16/15 0421  BP: 97/57 106/66  Pulse: 92 86  Temp: 36.4 C 36.6 C  Resp: 16 16    Last Pain:  Filed Vitals:   03/16/15 0620  PainSc: Asleep                 Legna Mausolf

## 2015-03-16 NOTE — Op Note (Signed)
Kara [name], Kara [name] NO.:  [id number]  MEDICAL RECORD NO.:  [id number]  LOCATION:  [id number]                        FACILITY:  [facility]  PHYSICIAN:  [place] Mckeough C. Ophelia Charter, M.D.    DATE OF BIRTH:  [birth year]  DATE OF PROCEDURE:  03/15/2015 DATE OF DISCHARGE:                              OPERATIVE REPORT   PREOPERATIVE DIAGNOSIS:  Cervical spondylosis, C5-6 and C6-7.  POSTOPERATIVE DIAGNOSIS:  Cervical spondylosis, C5-6 and C6-7.  PROCEDURE:  C5-6 and C6-7 anterior cervical diskectomy and fusion, allograft and plate.  SURGEON:  Lenoard Helbert C. Ophelia Charter, M.D.  ASSISTANT:  Genene Churn. Barry Dienes, PA-C, medically necessary and present for the entire procedure.  ESTIMATED BLOOD LOSS:  Less than 100 mL.  DRAINS:  One Hemovac neck.  IMPLANTS:  Skyline 30-mm plate, 40-JW screws x6.  A 7-mm graft, C5-6 and 6-mm lordotic graft at C6-7.  COMPLICATIONS:  None.  INDICATIONS:  This 59 year old female with chronic cervical spondylosis, has had progressive disk space loss, eccentric disk osteophyte complex with spinal stenosis without mass-effect on the cord at C5-6, severe left C7 foraminal stenosis, which is symptomatic and chronic changes at C6-7 with severe left greater than right C7 foraminal stenosis.  MRI shown progression at these two levels from previous comparison MRI in October 2014.  DESCRIPTION OF PROCEDURE:  After induction of general anesthesia and orotracheal intubation, neck was prepped after head halter traction applied without weight.  DuraPrep was used.  Preoperative Ancef 2 g were given for prophylaxis.  As the DuraPrep was drying, time-out procedure was performed.  Area was squared with towels, sterile skin marker in appropriate level starting at the midline extending to the left using the cricothyroid cartilage palpation, clavicle and carotid tubercle. Betadine Steri-Drape was applied with sterile Mayo stand at the head and thyroid sheets and drapes.  Incision was made  starting at the midline, extending to the left.  Platysma was split in line with the fibers. Blunt dissection inferior to the omohyoid since the C6-7 level had the most significant central stenosis and disk bulge.  A 25 short needle was placed.  C-arm was draped, brought in with lateral C-arm confirmed that we were at the C6-7 level.  Cloward teeth blades were placed right and left, smooth blades, cephalad and caudad.  Diskectomy was performed. Spurs were removed, which were very prominent at 7-10 mm.  Disk was removed.  There was overhanging spurs posteriorly with some shingling. Operative microscope was draped, brought in, posterior longitudinal ligament was taken down.  After spurs were removed, cord was decompressed, which would bulge in to the disk space.  Uncovertebral joints were stripped.  Foramina was opened up both right and left. Trial sizer showed a 6 graft gave nice tight fit.  Endplates were rasped.  Traction was pulled by the CRNA, graft was countersunk 2 mm. Some additional spur removed anteriorly so the plate would sit down and flushed.  Identical procedure was then completed at C5-6.  At this level, C7 graft was placed.  Spurs were prominent, thick.  There were no extruded fragments, but there were overhanging spurs and the spurs from C5 completely bridged across the disk space.  Once they  were thin using microdissection techniques, they were picked and removed off the dura, removal of the remaining posterior longitudinal ligament, total decompression of the dura, trial sizers and then placement of the 7 graft that had been marked at the midline, was countersunk 2 mm.  A 30- mm plate appropriate size was held with the small superior once it was fitted at the proper position, confirmed with AP and lateral C-arm. Then holes were drilled.  The screws were placed and final pictures were taken.  The screws were locked in with the locking screws x6.  The 12-mm screws were  used.  All screws looked good.  Graft was in good position. After irrigation with saline solution, Hemovac was placed with in and out technique on the left side, in line with the skin incision. Platysma was closed with 3-0 Vicryl, 4-0 Vicryl subcuticular closure, tincture of benzoin, Steri-Strips, applied surgical closure, subcuticular.  Benzoin, Steri-Strips, 4x4, tape and a soft collar. Instrument count and needle counts were correct.     Monte Zinni C. Ophelia Charter, M.D.     MCY/MEDQ  D:  03/15/2015  T:  03/16/2015  Job:  [id number]

## 2015-03-16 NOTE — Progress Notes (Signed)
Subjective: 1 Day Post-Op Procedure(s) (LRB): Cervical five-six, Cerival six-seven, Anterior Cervical Discectomy and Fusion, Allograft and Plate (N/A) Patient reports pain as mild.     " My hands are not numb now. My pain is gone in my arms. " Objective: Vital signs in last 24 hours: Temp:  [97 F (36.1 C)-98.6 F (37 C)] 97.8 F (36.6 C) (12/20 0421) Pulse Rate:  [78-97] 86 (12/20 0421) Resp:  [11-22] 16 (12/20 0421) BP: (97-135)/(57-93) 106/66 mmHg (12/20 0421) SpO2:  [93 %-99 %] 95 % (12/20 0421)  Intake/Output from previous day: 12/19 0701 - 12/20 0700 In: 1500 [I.V.:1500] Out: 46 [Urine:1; Drains:45] Intake/Output this shift:    No results for input(s): HGB in the last 72 hours. No results for input(s): WBC, RBC, HCT, PLT in the last 72 hours. No results for input(s): NA, K, CL, CO2, BUN, CREATININE, GLUCOSE, CALCIUM in the last 72 hours. No results for input(s): LABPT, INR in the last 72 hours.  Neurologically intact  Assessment/Plan: 1 Day Post-Op Procedure(s) (LRB): Cervical five-six, Cerival six-seven, Anterior Cervical Discectomy and Fusion, Allograft and Plate (N/A) Plan:  Discharge home office one week. Extra collar for shower with saran wrap around it. HV removed. Nurse dressing change.   Aryn Kops C 03/16/2015, 8:00 AM

## 2015-03-16 NOTE — Progress Notes (Signed)
Orthopedic Tech Progress Note Patient Details:  Kara [name] [birth year] [id number]  Ortho Devices Type of Ortho Device: Soft collar Ortho Device/Splint Location: neck Ortho Device/Splint Interventions: Freeman Caldron, Biannca Scantlin 03/16/2015, 8:42 AM

## 2015-03-16 NOTE — Progress Notes (Signed)
Discharge instructions and prescription provided to patient and husband.  IV removed.  Extra soft cervical collar ordered per MD order and provided to patient.  No questions at time of discharge.  All belongings with husband.  No questions at time of discharge.  Vital signs stable.  Escorted upon discharge via wheelchair by volunteer.

## 2015-03-26 ENCOUNTER — Ambulatory Visit: Payer: Managed Care, Other (non HMO) | Admitting: Family Medicine

## 2015-04-06 NOTE — Discharge Summary (Signed)
Patient ID: Kara [name] MRN: [id number] DOB/AGE: 1955/12/20 60 y.o.  Admit date: 03/15/2015 Discharge date: 04/06/2015  Admission Diagnoses:  Active Problems:   S/P cervical spinal fusion   Discharge Diagnoses:  Active Problems:   S/P cervical spinal fusion  status post Procedure(s): Cervical five-six, Cerival six-seven, Anterior Cervical Discectomy and Fusion, Allograft and Plate  Past Medical History  Diagnosis Date  . Fibromyalgia   . 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 Arthritis   . Rheumatoid arthritis(714.0)     autoimmune arthritis; methotrexate once/week  . Depression     takes meds daily  . Post-nasal drip     hx of  . Other specified rheumatoid arthritis, right shoulder ([place]) 08/01/2011  . Allergy   . Chronic kidney disease   . Urinary incontinence   . Family history of malignant neoplasm of breast   . Chronic pain     goes to [facility] for pain control  . Cellulitis of breast 11/2013    RIGHT BREAST  . Anxiety   . ADD (attention deficit disorder)     on Adderal  . Chronic fatigue and immune dysfunction syndrome   . Asthma     as a child    Surgeries: Procedure(s): Cervical five-six, Cerival six-seven, Anterior Cervical Discectomy and Fusion, Allograft and Plate on 17/79/3903   Consultants:    Discharged Condition: Improved  Hospital Course: Kara [name] is an 60 y.o. female who was admitted 03/15/2015 for operative treatment of cervical stenosis. Patient failed conservative treatments (please see the history and physical for the specifics) and had severe unremitting pain that affects sleep, daily activities and work/hobbies. After pre-op clearance, the patient was taken to the operating room on 03/15/2015 and underwent  Procedure(s): Cervical five-six, Cerival six-seven, Anterior Cervical Discectomy and Fusion, Allograft and Plate.    Patient was given perioperative antibiotics:   Anti-infectives    Start     Dose/Rate Route Frequency Ordered Stop   03/15/15 2000  ceFAZolin (ANCEF) IVPB 1 g/50 mL premix     1 g 100 mL/hr over 30 Minutes Intravenous 3 times per day 03/15/15 1606 03/16/15 0621   03/15/15 0900  ceFAZolin (ANCEF) IVPB 2 g/50 mL premix     2 g 100 mL/hr over 30 Minutes Intravenous To [facility] 03/14/15 1512 03/15/15 1159       Patient was given sequential compression devices and early ambulation to prevent DVT.   Patient benefited maximally from hospital stay and there were no complications. At the time of discharge, the patient was urinating/moving their bowels without difficulty, tolerating a regular diet, pain is controlled with oral pain medications and they have been cleared by PT/OT.   Recent vital signs: No data found.    Recent laboratory studies: No results for input(s): WBC, HGB, HCT, PLT, NA, K, CL, CO2, BUN, CREATININE, GLUCOSE, INR, CALCIUM in the last 72 hours.  Invalid input(s): PT, 2   Discharge Medications:     Medication List    TAKE these medications        ALPRAZolam 1 MG tablet  Commonly known as:  XANAX  Take 1 mg by mouth 2 (two) times daily as needed for anxiety.     amphetamine-dextroamphetamine 30 MG tablet  Commonly known as:  ADDERALL  Take 30 mg by mouth 3 (three) times daily.     BIOTIN PO  Take 1 tablet by mouth daily.  diclofenac sodium 1 % Gel  Commonly known as:  VOLTAREN  Apply 1 application topically every 4 (four) hours as needed (arthritis pain). Must be name brand     FETZIMA 120 MG Cp24  Generic drug:  Levomilnacipran HCl ER  Take 120 mg by mouth daily.     meloxicam 15 MG tablet  Commonly known as:  MOBIC  TAKE 1 TABLET (15 MG TOTAL) BY MOUTH DAILY.     meloxicam 15 MG tablet  Commonly known as:  MOBIC  TAKE 1 TABLET BY MOUTH EVERY DAY     NUCYNTA 100 MG Tabs  Generic drug:  Tapentadol HCl  Take 100 mg by mouth 5 (five) times daily as needed (pain).     OVER THE  COUNTER MEDICATION  Place 1 drop into both eyes daily as needed (dry eyes). [facility] redness relief maximum: dextran 70 0.1%, polyethylene glycol 400 1%, povidine 1%, tetrahydrozoline hcl 0.05%     oxyCODONE-acetaminophen 5-325 MG tablet  Commonly known as:  PERCOCET/ROXICET  Take 2 tablets by mouth every 6 (six) hours as needed for moderate pain.     tretinoin 0.1 % cream  Commonly known as:  RETIN-A  Apply 1 application topically at bedtime. 90 Day Supply     Tretinoin-Cleanser-Moisturizer 0.1 % CREAM Kit  Commonly known as:  TRETIN-X  Apply at bedtime     venlafaxine XR 75 MG 24 hr capsule  Commonly known as:  EFFEXOR-XR  Take 150 mg by mouth daily with breakfast.     zolpidem 10 MG tablet  Commonly known as:  AMBIEN  Take 10 mg by mouth at bedtime.        Diagnostic Studies: Dg Cervical Spine 2-3 Views  03/15/2015  CLINICAL DATA:  C5-6 and C6-7 anterior cervical discectomy and fusion. Allograft and plate. EXAM: CERVICAL SPINE - 2-3 VIEW; DG C-ARM 61-120 MIN COMPARISON:  MRI 01/26/2015 FINDINGS: Vertebral body alignment and heights are within normal. There is moderate spondylosis present. There is mild disc space narrowing at the C4-5 level. Anterior fusion hardware is intact and normally located from C5-C7. Prosthetic disc material at the C5-6 and C6-7 disc spaces. Recommend correlation with findings at the time of the procedure. IMPRESSION: Moderate spondylosis of the cervical spine with disc disease at the C4-5 level. Anterior fusion hardware intact from C5-C7. Electronically Signed   By: Marin Olp M.D.   On: 03/15/2015 14:13   Dg C-arm 61-120 Min  03/15/2015  CLINICAL DATA:  C5-6 and C6-7 anterior cervical discectomy and fusion. Allograft and plate. EXAM: CERVICAL SPINE - 2-3 VIEW; DG C-ARM 61-120 MIN COMPARISON:  MRI 01/26/2015 FINDINGS: Vertebral body alignment and heights are within normal. There is moderate spondylosis present. There is mild disc space narrowing at the C4-5  level. Anterior fusion hardware is intact and normally located from C5-C7. Prosthetic disc material at the C5-6 and C6-7 disc spaces. Recommend correlation with findings at the time of the procedure. IMPRESSION: Moderate spondylosis of the cervical spine with disc disease at the C4-5 level. Anterior fusion hardware intact from C5-C7. Electronically Signed   By: Marin Olp M.D.   On: 03/15/2015 14:13        Discharge Plan:  discharge to home  Disposition:     Signed: Lanae Crumbly  04/06/2015, 1:42 PM

## 2015-04-12 ENCOUNTER — Telehealth: Payer: Self-pay | Admitting: Family Medicine

## 2015-04-12 NOTE — Telephone Encounter (Signed)
I made final attempt to contact patient. Patient did not answer. Will route to provider if (1) wants to schedule appointment or if (2) wants to place referral for cardiology and let them see her to assess symptoms.

## 2015-04-12 NOTE — Telephone Encounter (Signed)
Noted. Advised Brooke communicate with Dr. Caryl Never for follow-up advice

## 2015-04-12 NOTE — Telephone Encounter (Signed)
TELEPHONE ADVICE RECORD [facility]  Patient Name: Kara [name]  DOB: [birth year]    Initial Comment Caller states having pain in chest that goes into back   Nurse Assessment  Nurse: Odis Luster, RN, Bjorn Loser Date/Time [name] Time): 04/12/2015 9:12:13 AM  Confirm and document reason for call. If symptomatic, describe symptoms. You must click the next button to save text entered. ---Caller states having pain in chest that goes into back. Reports that the pain is in her chest middle straight thru her to her chest. Reports a squeezing sensation. She has had this before, and it has been gastritis. Rates chest pain 4/10, intermittent but lingering. Reports that the pain has become more intense.  Has the patient traveled out of the country within the last 30 days? ---No  Does the patient have any new or worsening symptoms? ---Yes  Will a triage be completed? ---Yes  Related visit to physician within the last 2 weeks? ---No  Does the PT have any chronic conditions? (i.e. diabetes, asthma, etc.) ---Yes  List chronic conditions. ---hiatal hernia; fusion of the neck; and lumbar;  Is this a behavioral health or substance abuse call? ---No     Guidelines    Guideline Title Affirmed Question Affirmed Notes  Chest Pain [1] Intermittent chest pain or "angina" AND [2] increasing in severity or frequency (Exception: pains lasting a few seconds)    Final Disposition User   Go to ED Now Odis Luster, RN, Rhonda    Comments  Caller refused to go to ED, citing that she has be with her husband, she is his caregiver. He had surgery on Friday. Advised that nurse will alert office staff and she will receive a call back at some point. Advised to call EMS for worsening symptoms, caller voiced understanding.  Called backline: spoke with Alcario Drought, explained triage outcome and info given.   Referrals  GO TO FACILITY REFUSED   Disagree/Comply: Comply

## 2015-04-12 NOTE — Telephone Encounter (Signed)
Patient has not yet checked in to ED. I left a message for patient to return phone call - regarding an update on patient to see if she has or is planning to go to ED. Will try to contact patient again later.

## 2015-04-12 NOTE — Telephone Encounter (Signed)
Spoke with patient and she agreed to go to [facility] ED. Patient also requested Dr. Caryl Never to make referral for cardiologist. She wants him to pick the Cardiologist. Patient did not sound in acute distress at time of call and states she will be in route to ED.  Worth noting:  Father hx of CHF, COPD, and open heart surgery Mother hx tachycardia and arrhythmias  Patient states symptoms are similar to symptoms from last year and [place], [place] dx as acid reflex and prescribed a "purple pill" the pill did provide relief but symptoms continue to reoccur.

## 2015-04-12 NOTE — Telephone Encounter (Signed)
Would start with assessment here first (vs direct cardiology referral) if she refused to go to ED.

## 2015-04-13 NOTE — Telephone Encounter (Signed)
I spoke with patient & she states that her symptoms have went away & that she is feeling much better, but she would still like to see Dr. Caryl Never. Patient states she was unable to go to ED because she could not find anyone to care for her husband. Appointment scheduled for tomorrow 04/14/15.

## 2015-04-14 ENCOUNTER — Ambulatory Visit (INDEPENDENT_AMBULATORY_CARE_PROVIDER_SITE_OTHER): Payer: Managed Care, Other (non HMO) | Admitting: Family Medicine

## 2015-04-14 ENCOUNTER — Encounter: Payer: Self-pay | Admitting: Family Medicine

## 2015-04-14 VITALS — BP 102/74 | HR 91 | Temp 99.2°F | Ht 64.0 in | Wt 163.6 lb

## 2015-04-14 DIAGNOSIS — R079 Chest pain, unspecified: Secondary | ICD-10-CM | POA: Diagnosis not present

## 2015-04-14 NOTE — Patient Instructions (Signed)
Nonspecific Chest Pain  °Chest pain can be caused by many different conditions. There is always a chance that your pain could be related to something serious, such as a heart attack or a blood clot in your lungs. Chest pain can also be caused by conditions that are not life-threatening. If you have chest pain, it is very important to follow up with your health care provider. °CAUSES  °Chest pain can be caused by: °· Heartburn. °· Pneumonia or bronchitis. °· Anxiety or stress. °· Inflammation around your heart (pericarditis) or lung (pleuritis or pleurisy). °· A blood clot in your lung. °· A collapsed lung (pneumothorax). It can develop suddenly on its own (spontaneous pneumothorax) or from trauma to the chest. °· Shingles infection (varicella-zoster virus). °· Heart attack. °· Damage to the bones, muscles, and cartilage that make up your chest wall. This can include: °¨ Bruised bones due to injury. °¨ Strained muscles or cartilage due to frequent or repeated coughing or overwork. °¨ Fracture to one or more ribs. °¨ Sore cartilage due to inflammation (costochondritis). °RISK FACTORS  °Risk factors for chest pain may include: °· Activities that increase your risk for trauma or injury to your chest. °· Respiratory infections or conditions that cause frequent coughing. °· Medical conditions or overeating that can cause heartburn. °· Heart disease or family history of heart disease. °· Conditions or health behaviors that increase your risk of developing a blood clot. °· Having had chicken pox (varicella zoster). °SIGNS AND SYMPTOMS °Chest pain can feel like: °· Burning or tingling on the surface of your chest or deep in your chest. °· Crushing, pressure, aching, or squeezing pain. °· Dull or sharp pain that is worse when you move, cough, or take a deep breath. °· Pain that is also felt in your back, neck, shoulder, or arm, or pain that spreads to any of these areas. °Your chest pain may come and go, or it may stay  constant. °DIAGNOSIS °Lab tests or other studies may be needed to find the cause of your pain. Your health care provider may have you take a test called an ambulatory ECG (electrocardiogram). An ECG records your heartbeat patterns at the time the test is performed. You may also have other tests, such as: °· Transthoracic echocardiogram (TTE). During echocardiography, sound waves are used to create a picture of all of the heart structures and to look at how blood flows through your heart. °· Transesophageal echocardiogram (TEE). This is a more advanced imaging test that obtains images from inside your body. It allows your health care provider to see your heart in finer detail. °· Cardiac monitoring. This allows your health care provider to monitor your heart rate and rhythm in real time. °· Holter monitor. This is a portable device that records your heartbeat and can help to diagnose abnormal heartbeats. It allows your health care provider to track your heart activity for several days, if needed. °· Stress tests. These can be done through exercise or by taking medicine that makes your heart beat more quickly. °· Blood tests. °· Imaging tests. °TREATMENT  °Your treatment depends on what is causing your chest pain. Treatment may include: °· Medicines. These may include: °¨ Acid blockers for heartburn. °¨ Anti-inflammatory medicine. °¨ Pain medicine for inflammatory conditions. °¨ Antibiotic medicine, if an infection is present. °¨ Medicines to dissolve blood clots. °¨ Medicines to treat coronary artery disease. °· Supportive care for conditions that do not require medicines. This may include: °¨ Resting. °¨ Applying heat   or cold packs to injured areas. °¨ Limiting activities until pain decreases. °HOME CARE INSTRUCTIONS °· If you were prescribed an antibiotic medicine, finish it all even if you start to feel better. °· Avoid any activities that bring on chest pain. °· Do not use any tobacco products, including  cigarettes, chewing tobacco, or electronic cigarettes. If you need help quitting, ask your health care provider. °· Do not drink alcohol. °· Take medicines only as directed by your health care provider. °· Keep all follow-up visits as directed by your health care provider. This is important. This includes any further testing if your chest pain does not go away. °· If heartburn is the cause for your chest pain, you may be told to keep your head raised (elevated) while sleeping. This reduces the chance that acid will go from your stomach into your esophagus. °· Make lifestyle changes as directed by your health care provider. These may include: °¨ Getting regular exercise. Ask your health care provider to suggest some activities that are safe for you. °¨ Eating a heart-healthy diet. A registered dietitian can help you to learn healthy eating options. °¨ Maintaining a healthy weight. °¨ Managing diabetes, if necessary. °¨ Reducing stress. °SEEK MEDICAL CARE IF: °· Your chest pain does not go away after treatment. °· You have a rash with blisters on your chest. °· You have a fever. °SEEK IMMEDIATE MEDICAL CARE IF:  °· Your chest pain is worse. °· You have an increasing cough, or you cough up blood. °· You have severe abdominal pain. °· You have severe weakness. °· You faint. °· You have chills. °· You have sudden, unexplained chest discomfort. °· You have sudden, unexplained discomfort in your arms, back, neck, or jaw. °· You have shortness of breath at any time. °· You suddenly start to sweat, or your skin gets clammy. °· You feel nauseous or you vomit. °· You suddenly feel light-headed or dizzy. °· Your heart begins to beat quickly, or it feels like it is skipping beats. °These symptoms may represent a serious problem that is an emergency. Do not wait to see if the symptoms will go away. Get medical help right away. Call your local emergency services (911 in the [place] Do not drive yourself to the hospital. °  °This  information is not intended to replace advice given to you by your health care provider. Make sure you discuss any questions you have with your health care provider. °  °Document Released: 12/21/2004 Document Revised: 04/03/2014 Document Reviewed: 10/17/2013 °Elsevier Interactive Patient Education ©2016 [employer] ° °

## 2015-04-14 NOTE — Progress Notes (Signed)
Subjective:    Patient ID: Kara [name], female    DOB: Sep 04, 1955, 60 y.o.   MRN: [id number]  HPI Patient seen with chest pain. She has multiple chronic problems including history of attention deficit disorder, chronic neck and back pain, fibromyalgia, history of kidney stones, rheumatoid arthritis. She is followed by multiple specialists. She had cervical neck fusion back on December 19. She has noted over the past several weeks intermittent substernal chest pain which she describes as "sharp "substernally radiating toward the back. Duration varies but sometimes around 20 minutes. Never related to activity. She is very sedentary. Occasional left arm pain. She has some chronic low-grade nausea which is unchanged. Denies any active GERD symptoms. No diaphoresis. No dyspnea.  No exacerbating or alleviating factors. She recalls previous Holter monitor years ago but no other recent heart testing. Nonsmoker. No history of diabetes. Blood pressure well controlled. Previous lipids over year ago total cholesterol 225, triglycerides 2 and her 62, HDL 51, LDL 121. Brother died age 58 of cancer. Mother had coronary disease in her 91s and father had coronary disease in his 28s.  Past Medical History  Diagnosis Date  . Fibromyalgia   . 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 Arthritis   . Rheumatoid arthritis(714.0)     autoimmune arthritis; methotrexate once/week  . Depression     takes meds daily  . Post-nasal drip     hx of  . Other specified rheumatoid arthritis, right shoulder (HCC) 08/01/2011  . Allergy   . Chronic kidney disease   . Urinary incontinence   . Family history of malignant neoplasm of breast   . Chronic pain     goes to [facility]  . Cellulitis of breast 11/2013    RIGHT BREAST  . Anxiety   . ADD (attention deficit disorder)     on Adderal  . Chronic fatigue and immune dysfunction syndrome   . Asthma     as a  child   Past Surgical History  Procedure Laterality Date  . Shoulder surgery  2012    right  . Back surgery  2011    lower back fusion l4-5, s1  . Bladder surgery  2009  . Tubal ligation  1988  . Breast enhancement surgery  2003  . Liposuction  2003    abdominal  . Tonsillectomy  1987  . Total shoulder arthroplasty  08/01/2011    Procedure: TOTAL SHOULDER ARTHROPLASTY;  Surgeon: Eulas Post, MD;  Location[facility] OR;  Service: Orthopedics;  Laterality: Right;  Right total shoulder arthroplasty  . Mass excision  11/03/2011    Procedure: MINOR EXCISION OF MASS;  Surgeon: Wyn Forster., MD;  Location: [facility];  Service: Orthopedics;  Laterality: Left;  debride IP joint, cyst excision left index  . Breast surgery  10/2013    REMOVAL OF BREAST IMPLANTS  . Breast implants removed    . Incision and drainage abscess Right 01/16/2014    Procedure: INCISION AND DRAINAGE AND OF RIGHT BREAST ABCESS;  Surgeon: Glenna Fellows, MD;  Location: [facility] ORS;  Service: General;  Laterality: Right;  . Anterior cervical decomp/discectomy fusion N/A 03/15/2015    Procedure: Cervical five-six, Cerival six-seven, Anterior Cervical Discectomy and Fusion, Allograft and Plate;  Surgeon: Eldred Manges, MD;  Location: [facility] OR;  Service: Orthopedics;  Laterality: N/A;    reports that she has quit smoking. Her  smoking use included Cigarettes. She has a 5 pack-year smoking history. She has never used smokeless tobacco. She reports that she does not drink alcohol or use illicit drugs. family history includes Arthritis in her mother; Breast cancer (age of onset: 51) in her maternal aunt; Breast cancer (age of onset: 25) in her mother; COPD in her father; Cancer (age of onset: 30) in her cousin; Colon cancer (age of onset: 60) in her paternal aunt; Heart disease in her mother; Hypertension in her father; Stomach cancer (age of onset: 54) in her paternal uncle. Allergies  Allergen Reactions  . Aspirin  Anaphylaxis  . Ibuprofen Anaphylaxis  . Ketamine Other (See Comments)    Hallucinations  . Ativan [Lorazepam] Other (See Comments)    Makes agitated, combative   . Tramadol Hcl Itching, Swelling and Rash      Review of Systems  Constitutional: Negative for fever and chills.  Respiratory: Negative for shortness of breath.   Cardiovascular: Positive for chest pain. Negative for palpitations and leg swelling.  Gastrointestinal: Positive for nausea. Negative for vomiting and abdominal pain.  Neurological: Negative for dizziness and syncope.       Objective:   Physical Exam  Constitutional: She appears well-developed and well-nourished.  Neck: Neck supple. No thyromegaly present.  No carotid bruits  Cardiovascular: Normal rate and regular rhythm.   No murmur heard. Pulmonary/Chest: Effort normal and breath sounds normal. No respiratory distress. She has no wheezes. She has no rales.  Musculoskeletal: She exhibits no edema.  Lymphadenopathy:    She has no cervical adenopathy.          Assessment & Plan:  Chest pain. Symptoms are somewhat atypical in that her pain mostly occurs at rest and sharp quality of pain. She has moderate risk factors-mostly family history. Start with EKG. Consider nuclear stress test.

## 2015-05-04 ENCOUNTER — Telehealth (HOSPITAL_COMMUNITY): Payer: Self-pay | Admitting: *Deleted

## 2015-05-04 NOTE — Telephone Encounter (Signed)
Left message on voicemail in reference to upcoming appointment scheduled for 05/06/15. Phone number given for a call back so details instructions can be given. Kara [name], Kara [name]

## 2015-05-06 ENCOUNTER — Encounter (HOSPITAL_COMMUNITY): Payer: Self-pay

## 2015-06-14 ENCOUNTER — Ambulatory Visit (INDEPENDENT_AMBULATORY_CARE_PROVIDER_SITE_OTHER): Payer: Managed Care, Other (non HMO) | Admitting: Family Medicine

## 2015-06-14 ENCOUNTER — Encounter: Payer: Self-pay | Admitting: Family Medicine

## 2015-06-14 VITALS — BP 133/95 | HR 96 | Temp 98.4°F | Resp 20 | Ht 64.0 in | Wt 159.5 lb

## 2015-06-14 DIAGNOSIS — R5382 Chronic fatigue, unspecified: Secondary | ICD-10-CM

## 2015-06-14 DIAGNOSIS — B351 Tinea unguium: Secondary | ICD-10-CM | POA: Diagnosis not present

## 2015-06-14 DIAGNOSIS — G9332 Myalgic encephalomyelitis/chronic fatigue syndrome: Secondary | ICD-10-CM

## 2015-06-14 LAB — VITAMIN B12: VITAMIN B 12: 605 pg/mL (ref 211–911)

## 2015-06-14 LAB — TSH: TSH: 3.25 u[IU]/mL (ref 0.35–4.50)

## 2015-06-14 LAB — MAGNESIUM: Magnesium: 2.2 mg/dL (ref 1.5–2.5)

## 2015-06-14 NOTE — Patient Instructions (Signed)
Chronic Fatigue Syndrome Chronic fatigue syndrome (CFS) is a condition in which there is lasting, extreme tiredness (fatigue) that does not improve with rest. CFS affects women up to four times more often than men. If you have CFS, fatigue and other symptoms can make it hard for you to get through your day. There is no treatment or cure. You will need to work closely with your health care provider to come up with a treatment plan that works for you. CAUSES  No one knows what causes CFS. It may be triggered by a flu-like illness or by mono. Other triggers may include:  An abnormal immune system.  Low blood pressure.  Poor diet.  Physical or emotional stress. SIGNS AND SYMPTOMS The main symptom is fatigue that lasts all day, especially after physical or mental stress. Other common symptoms include:  An extreme loss of energy with no obvious cause.  Muscle or joint soreness.  Severe weakness.  Frequent headaches.  Fever.  Sore throat.  Swollen lymph glands.  Sleep is not refreshing.  Loss of concentration or memory. Less common symptoms may include:  Chills.  Night sweats.  Tingling or numbness.  Blurred vision.  Dizziness.  Sensitivity to noise or odors.  Mood swings.  Anxiety, panic attacks, and depression. Your symptoms may come and go, or you may have them all the time. DIAGNOSIS  There are no tests that can help health care providers diagnose CFS. It may take a long time for you to get a correct diagnosis. Your health care provider may need to do a number of tests to rule out other conditions that could be causing your symptoms. You may be diagnosed with CFS if:  You have fatigue that has lasted for at least six months.  Your fatigue is not relieved by rest.  Your fatigue is not caused by another condition.  Your fatigue is severe enough to interfere with work and daily activities.  You have at least four common symptoms of CFS. TREATMENT  There is no  cure for CFS at this time. The condition affects everyone differently. You will need to work with your health care provider to find the best treatment for your symptoms. Treatment may include:  Improving sleep with a regular bedtime routine.  Avoiding caffeine, alcohol, and tobacco.  Doing light exercise and stretching during the day.  Taking medicine to help you sleep.  Taking over-the-counter medicines to relieve joint or muscle pain.  Learning and practicing relaxation techniques.  Using memory aids or doing brain teasers to improve memory and concentration.  Seeing a mental health professional to evaluate and treat depression, if necessary.  Trying massage therapy, acupuncture, and movement exercises, like yoga or tai chi. HOME CARE INSTRUCTIONS Work closely with your health care provider to follow your treatment plan at home. You may need to make major lifestyle changes. If treatment does not seem to help, get a second opinion. You may get help from many health care providers, including doctors, mental health specialists, physical therapists, and rehabilitation therapists. Having the support of friends and loved ones is also important. SEEK MEDICAL CARE IF:  Your symptoms are not responding to treatment.  You are having strong feelings of anger, guilt, anxiety, or depression.   This information is not intended to replace advice given to you by your health care provider. Make sure you discuss any questions you have with your health care provider.   Document Released: 04/20/2004 Document Revised: 04/03/2014 Document Reviewed: 01/31/2013 Elsevier Interactive Patient   Education 2016 [place] today, follow up dependant on results. We will call you within 1 week with all lab results.

## 2015-06-14 NOTE — Progress Notes (Signed)
Patient ID: Kara [name], female   DOB: 1956/01/31, 60 y.o.   MRN: [id number]      Patient ID: Kara [name], female  DOB: 1955-08-10, 60 y.o.   MRN: [id number]  Subjective:  Kara [name] is a 60 y.o. female present for establishment of care from prior [place] physician.  All past medical history, surgical history, allergies, family history, immunizations, medications and social history were updated  in the electronic medical record today. All recent labs, ED visits and hospitalizations within the last year were reviewed.  Patient's care team: orthopedics: Dr. Ophelia Charter, neck fusion/lumbar fusion Psych:  Admission 02/2014, suicide attempt. Under the care of Dr. [name]. Prescribed Effexor, Fetzima, adderrall. Abilify on med rec, pt states she is not taking it.   Chronic pain/chronic fatigue syndrome/fibromyalgia: Patient reports being on chronic narcotics secondary to neck and back pain. She has undergone surgeries for cervical fusion and lumbar fusion over the past year. She states she has been able to taper off all of her narcotics, and does not wish to be on continued narcotics. She was being managed by pain management. She removed herself and tapered off medications on her own. She states the discomfort she feels currently is not in the form of pain but in the form of fatigue. She states since her surgeries, she has been trying to start exercising again. She had run 4 miles a day prior to her surgeries. She states she tried to walk, and pulled a muscle and aggravated her sciatic nerve. She has been tried on gabapentin and Lyrica in the past for her symptoms. She is prescribed Effexor by her psychiatrist. She reports nail changes and thinning hair, which she attributes to the narcotic use. Patient states she had a history of low vitamin D along time ago, and used to be on oral solution of vitamin D. Patient denies any history of thyroid disorder. She takes B-12 supplementation, but does not know her levels of  B12. She is wondering if there is something they can help her with her chronic pain, that she has not tried already. Patient reports decreased libido. She denies dyspareunia or vaginal dryness. Patient is postmenopausal.  Onychomycosis: Patient states she's had toenail fungus for greater in a year. She has tried topical treatment, without resolution of symptoms. She reports thickening of her bilateral great toe, with surrounding toes being affected. No erythema, drainage or swelling. No pain.  Past Medical History  Diagnosis Date  . Fibromyalgia   . 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 Arthritis   . Rheumatoid arthritis(714.0)     autoimmune arthritis; methotrexate once/week  . Depression     takes meds daily  . Post-nasal drip     hx of  . Other specified rheumatoid arthritis, right shoulder (HCC) 08/01/2011  . Allergy   . Chronic kidney disease   . Urinary incontinence   . Family history of malignant neoplasm of breast   . Chronic pain     goes to [facility]  . Cellulitis of breast 11/2013    RIGHT BREAST  . Anxiety   . ADD (attention deficit disorder)     on Adderal  . Chronic fatigue and immune dysfunction syndrome   . Asthma     as a child  . Aggressive behavior of adult   . Nephrolithiasis   . Cold sore   . Osteoporosis    Allergies  Allergen Reactions  .  Aspirin Anaphylaxis  . Ibuprofen Anaphylaxis  . Ketamine Other (See Comments)    Hallucinations  . Ativan [Lorazepam] Other (See Comments)    Makes agitated, combative   . Tramadol Hcl Itching, Swelling and Rash   Past Surgical History  Procedure Laterality Date  . Shoulder surgery  2012    right  . Back surgery  2011    lower back fusion l4-5, s1  . Bladder surgery  2009  . Tubal ligation  1988  . Breast enhancement surgery  2003  . Liposuction  2003    abdominal  . Tonsillectomy  1987  . Total shoulder arthroplasty  08/01/2011     Procedure: TOTAL SHOULDER ARTHROPLASTY;  Surgeon: Eulas Post, MD;  Location: [facility] OR;  Service: Orthopedics;  Laterality: Right;  Right total shoulder arthroplasty  . Mass excision  11/03/2011    Procedure: MINOR EXCISION OF MASS;  Surgeon: Wyn Forster., MD;  Location: [facility];  Service: Orthopedics;  Laterality: Left;  debride IP joint, cyst excision left index  . Breast surgery  10/2013    REMOVAL OF BREAST IMPLANTS  . Breast implants removed    . Incision and drainage abscess Right 01/16/2014    Procedure: INCISION AND DRAINAGE AND OF RIGHT BREAST ABCESS;  Surgeon: Glenna Fellows, MD;  Location: [facility] ORS;  Service: General;  Laterality: Right;  . Anterior cervical decomp/discectomy fusion N/A 03/15/2015    Procedure: Cervical five-six, Cerival six-seven, Anterior Cervical Discectomy and Fusion, Allograft and Plate;  Surgeon: Eldred Manges, MD;  Location: [facility] OR;  Service: Orthopedics;  Laterality: N/A;   Family History  Problem Relation Age of Onset  . Arthritis Mother   . Heart disease Mother     ?psvt  . Breast cancer Mother 5    TAH/BSO  . Cancer Mother   . Mental illness Mother   . COPD Father   . Hypertension Father   . Alcohol abuse Father   . Mental illness Father   . Heart disease Father   . Breast cancer Maternal Aunt 27    deceased  . Cancer Cousin 105    female; unknown primary  . Colon cancer Paternal Aunt 77    deceased at 12  . Stomach cancer Paternal Uncle 16    deceased at 90  . Alcohol abuse Brother   . Cancer Brother   . Hodgkin's lymphoma Brother   . Mental illness Brother    Social History   Social History  . Marital Status: Married    Spouse Name: N/A  . Number of Children: N/A  . Years of Education: N/A   Occupational History  . Not on file.   Social History Main Topics  . Smoking status: Former Smoker -- 0.50 packs/day for 10 years    Types: Cigarettes  . Smokeless tobacco: Never Used     Comment: smoked for 10  years  . Alcohol Use: No     Comment: denied alcohol  . Drug Use: No     Comment: denied any drug use with admission nurse  . Sexual Activity: Yes    Birth Control/ Protection: Post-menopausal   Other Topics Concern  . Not on file   Social History Narrative   Married, Kara [name].    6 children.    [employer], Retired Child psychotherapist.    Denies alcohol, tobacco or drug use.   Drinks caffeinated beverages. Uses herbal remedies. Take a daily vitamin.   Wears her seatbelt. Exercises  greater than 3 times a week.   Smoke detector in the home, firearms in the home in a locked cabinet, feels safe in her relationships.    ROS: Negative, with the exception of above mentioned in HPI  Objective: BP 133/95 mmHg  Pulse 96  Temp(Src) 98.4 F (36.9 C)  Resp 20  Ht 5\' 4"  (1.626 m)  Wt 159 lb 8 oz (72.349 kg)  BMI 27.36 kg/m2  SpO2 100% Gen: Afebrile. No acute distress. Nontoxic in appearance, well-developed, well-nourished, female HENT: AT. .  MMM, no oral lesions Eyes:Pupils Equal Round Reactive to light, Extraocular movements intact,  Conjunctiva without redness, discharge or icterus. Neck/lymp/endocrine: Supple,no  Lymphadenopathy, no thyromegaly, anterior neck incision well healed.  CV: RRR  Chest: CTAB, no wheeze, rhonchi or crackles. Abd: Soft. NTND. BS present  Skin: No rashes, purpura or petechiae. Warm and well-perfused. Skin intact. Neuro/Msk: Normal gait. PERLA. EOMi. Alert. Oriented x3.   Psych: Normal affect, dress and demeanor. Normal speech. Normal thought content and judgment. Foot: No erythema, no drainage. Bilateral large toes with thickening yellow cloudy nail.  Assessment/plan: Kara [name] is a 59 y.o. female present for establishment of care.  1. Chronic fatigue disorder - Pt has chronic fatigue disorder and fibromyalgia. She states her problem is not pain any longer and prefers to be off all narcotics. She exam findings consistent with hypothyroid and a history of Vit d  deficiency in which she took oral vit D in the past.  - discussed daily exercise to improve symptoms 5x a week. Pt is under psychiatric care already and may need consistent reassurance this is not an easily treatable condition and studies do not show benefit with medications compared to exercise and CBT. - Discussed with patient decreased libido could be secondary to her depression medications versus thyroid disorder. -  TSH - B12 - VITAMIN D 25 Hydroxy (Vit-D Deficiency, Fractures); Future - Magnesium  2. Nail fungus - Seems to have been treated prior and chronic. Discussed treatment is usually 46 weeks with solution. - Discussed dermatology referral to send for culture, and she was agreeable to that today.  - Ambulatory referral to Dermatology.   Follow-up dependent on lab results  Electronically signed by: 42, DO [facility] [place]

## 2015-06-15 ENCOUNTER — Other Ambulatory Visit (INDEPENDENT_AMBULATORY_CARE_PROVIDER_SITE_OTHER): Payer: Managed Care, Other (non HMO)

## 2015-06-15 ENCOUNTER — Telehealth: Payer: Self-pay | Admitting: Family Medicine

## 2015-06-15 DIAGNOSIS — R5382 Chronic fatigue, unspecified: Secondary | ICD-10-CM

## 2015-06-15 DIAGNOSIS — G9332 Myalgic encephalomyelitis/chronic fatigue syndrome: Secondary | ICD-10-CM

## 2015-06-15 NOTE — Telephone Encounter (Addendum)
Please call pt: - Her vit D is mildly low. She had been on a solution in the past, I have called this in for her to start and retest Vit d in 12 weeks.  - her labs resulted with normal B12, thyroid and magnesium.  - I would like her to have cbc/ferritn/iron also drawn (location of choice). I have placed these as future orders. I would like her to follow up in 4 weeks to see if she is noticing an improvement on vit d, and have the above labs (CBC/iron) drawn 3-5 days prior so we can have the results for her appt.

## 2015-06-16 ENCOUNTER — Other Ambulatory Visit: Payer: Self-pay | Admitting: *Deleted

## 2015-06-16 LAB — VITAMIN D 25 HYDROXY (VIT D DEFICIENCY, FRACTURES): VITD: 22.83 ng/mL — ABNORMAL LOW (ref 30.00–100.00)

## 2015-06-16 MED ORDER — VITAMIN D (ERGOCALCIFEROL) 1.25 MG (50000 UNIT) PO CAPS: 50000.0000 [IU] | ORAL_CAPSULE | ORAL | Status: DC

## 2015-06-16 MED ORDER — CHOLECALCIFEROL 400 UNIT/ML PO LIQD: 1000.0000 [IU] | Freq: Every day | ORAL | Status: DC

## 2015-06-16 NOTE — Telephone Encounter (Signed)
Left message with lab results and instructions on patient voice mail per DPR 

## 2015-07-01 ENCOUNTER — Other Ambulatory Visit (INDEPENDENT_AMBULATORY_CARE_PROVIDER_SITE_OTHER): Payer: Managed Care, Other (non HMO)

## 2015-07-01 DIAGNOSIS — R5382 Chronic fatigue, unspecified: Secondary | ICD-10-CM

## 2015-07-01 DIAGNOSIS — G9332 Myalgic encephalomyelitis/chronic fatigue syndrome: Secondary | ICD-10-CM

## 2015-07-01 LAB — CBC WITH DIFFERENTIAL/PLATELET
Basophils Absolute: 0 10*3/uL (ref 0.0–0.1)
Basophils Relative: 0.4 % (ref 0.0–3.0)
EOS ABS: 0.1 10*3/uL (ref 0.0–0.7)
EOS PCT: 1 % (ref 0.0–5.0)
HEMATOCRIT: 41.3 % (ref 36.0–46.0)
HEMOGLOBIN: 13.8 g/dL (ref 12.0–15.0)
LYMPHS PCT: 28.1 % (ref 12.0–46.0)
Lymphs Abs: 2.7 10*3/uL (ref 0.7–4.0)
MCHC: 33.5 g/dL (ref 30.0–36.0)
MCV: 84.5 fl (ref 78.0–100.0)
MONOS PCT: 6.1 % (ref 3.0–12.0)
Monocytes Absolute: 0.6 10*3/uL (ref 0.1–1.0)
NEUTROS ABS: 6.2 10*3/uL (ref 1.4–7.7)
Neutrophils Relative %: 64.4 % (ref 43.0–77.0)
PLATELETS: 378 10*3/uL (ref 150.0–400.0)
RBC: 4.89 Mil/uL (ref 3.87–5.11)
RDW: 13.8 % (ref 11.5–15.5)
WBC: 9.6 10*3/uL (ref 4.0–10.5)

## 2015-07-01 LAB — IRON AND TIBC
%SAT: 20 % (ref 11–50)
Iron: 97 ug/dL (ref 45–160)
TIBC: 487 ug/dL — ABNORMAL HIGH (ref 250–450)
UIBC: 390 ug/dL (ref 125–400)

## 2015-07-01 LAB — FERRITIN: FERRITIN: 11.9 ng/mL (ref 10.0–291.0)

## 2015-07-12 ENCOUNTER — Ambulatory Visit: Payer: Managed Care, Other (non HMO) | Admitting: Family Medicine

## 2015-07-12 ENCOUNTER — Telehealth: Payer: Self-pay | Admitting: Family Medicine

## 2015-07-12 NOTE — Telephone Encounter (Signed)
Spoke with patient she forgot her appt was today reviewed lab results with patient. She will call back to schedule CPE.

## 2015-07-12 NOTE — Telephone Encounter (Signed)
Please call pt: - she missed her appt today.  - She had labs collected that was going to be reviewed on this appt.  - Briefly her labs are normal.

## 2015-07-14 ENCOUNTER — Encounter: Payer: Self-pay | Admitting: *Deleted

## 2015-07-14 ENCOUNTER — Telehealth: Payer: Self-pay | Admitting: Family Medicine

## 2015-07-14 NOTE — Telephone Encounter (Signed)
Patient is still having ongoing fatigue. Wants to know if she needs a follow up since her labs were all negative.

## 2015-07-14 NOTE — Telephone Encounter (Signed)
Left message for patient to call back and schedule her CPE she missed her appt scheduled for 07/12/15

## 2015-07-20 ENCOUNTER — Ambulatory Visit: Payer: Managed Care, Other (non HMO) | Admitting: Family Medicine

## 2015-08-18 ENCOUNTER — Ambulatory Visit (INDEPENDENT_AMBULATORY_CARE_PROVIDER_SITE_OTHER): Payer: Managed Care, Other (non HMO) | Admitting: Family Medicine

## 2015-08-18 ENCOUNTER — Encounter: Payer: Self-pay | Admitting: Family Medicine

## 2015-08-18 VITALS — BP 152/96 | HR 86 | Temp 98.3°F | Resp 18 | Ht 64.0 in | Wt 155.5 lb

## 2015-08-18 DIAGNOSIS — Z981 Arthrodesis status: Secondary | ICD-10-CM

## 2015-08-18 DIAGNOSIS — G8929 Other chronic pain: Secondary | ICD-10-CM | POA: Insufficient documentation

## 2015-08-18 DIAGNOSIS — R030 Elevated blood-pressure reading, without diagnosis of hypertension: Secondary | ICD-10-CM

## 2015-08-18 DIAGNOSIS — E663 Overweight: Secondary | ICD-10-CM | POA: Insufficient documentation

## 2015-08-18 DIAGNOSIS — E559 Vitamin D deficiency, unspecified: Secondary | ICD-10-CM | POA: Diagnosis not present

## 2015-08-18 DIAGNOSIS — Z0000 Encounter for general adult medical examination without abnormal findings: Secondary | ICD-10-CM | POA: Diagnosis not present

## 2015-08-18 DIAGNOSIS — M069 Rheumatoid arthritis, unspecified: Secondary | ICD-10-CM | POA: Diagnosis not present

## 2015-08-18 DIAGNOSIS — R0789 Other chest pain: Secondary | ICD-10-CM

## 2015-08-18 DIAGNOSIS — R5382 Chronic fatigue, unspecified: Secondary | ICD-10-CM

## 2015-08-18 DIAGNOSIS — G9332 Myalgic encephalomyelitis/chronic fatigue syndrome: Secondary | ICD-10-CM

## 2015-08-18 DIAGNOSIS — Z6826 Body mass index (BMI) 26.0-26.9, adult: Secondary | ICD-10-CM | POA: Diagnosis not present

## 2015-08-18 DIAGNOSIS — IMO0001 Reserved for inherently not codable concepts without codable children: Secondary | ICD-10-CM | POA: Insufficient documentation

## 2015-08-18 DIAGNOSIS — Z9889 Other specified postprocedural states: Secondary | ICD-10-CM

## 2015-08-18 DIAGNOSIS — M4322 Fusion of spine, cervical region: Secondary | ICD-10-CM

## 2015-08-18 LAB — LIPID PANEL
Cholesterol: 278 mg/dL — ABNORMAL HIGH (ref 0–200)
HDL: 64.3 mg/dL (ref >39.00)
LDL Cholesterol: 178 mg/dL — ABNORMAL HIGH (ref 0–99)
NonHDL: 213.9
Total CHOL/HDL Ratio: 4
Triglycerides: 182 mg/dL — ABNORMAL HIGH (ref 0.0–149.0)
VLDL: 36.4 mg/dL (ref 0.0–40.0)

## 2015-08-18 LAB — HEMOGLOBIN A1C: Hgb A1c MFr Bld: 6.2 % (ref 4.6–6.5)

## 2015-08-18 MED ORDER — METHYLPREDNISOLONE ACETATE 80 MG/ML IJ SUSP
80.0000 mg | Freq: Once | INTRAMUSCULAR | Status: AC
  Administered 2015-08-18: 80 mg via INTRAMUSCULAR

## 2015-08-18 MED ORDER — TRETINOIN 0.1 % EX CREA: 1.0000 "application " | TOPICAL_CREAM | Freq: Every day | CUTANEOUS | Status: DC
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
## 2015-08-18 NOTE — Patient Instructions (Addendum)
Below 140/90 BP is normal, if routinely above at home readings will need to see you to discuss medications.  We will call you with lab results when available I will place referral to cardiology and new pain management for you today. You will hear from those offices once appt available.  IM depo medrol today to see if arthritis responds.  I called in cream for you.   Health Maintenance, Female Adopting a healthy lifestyle and getting preventive care can go a long way to promote health and wellness. Talk with your health care provider about what schedule of regular examinations is right for you. This is a good chance for you to check in with your provider about disease prevention and staying healthy. In between checkups, there are plenty of things you can do on your own. Experts have done a lot of research about which lifestyle changes and preventive measures are most likely to keep you healthy. Ask your health care provider for more information. WEIGHT AND DIET  Eat a healthy diet  Be sure to include plenty of vegetables, fruits, low-fat dairy products, and lean protein.  Do not eat a lot of foods high in solid fats, added sugars, or salt.  Get regular exercise. This is one of the most important things you can do for your health.  Most adults should exercise for at least 150 minutes each week. The exercise should increase your heart rate and make you sweat (moderate-intensity exercise).  Most adults should also do strengthening exercises at least twice a week. This is in addition to the moderate-intensity exercise.  Maintain a healthy weight  Body mass index (BMI) is a measurement that can be used to identify possible weight problems. It estimates body fat based on height and weight. Your health care provider can help determine your BMI and help you achieve or maintain a healthy weight.  For females 59 years of age and older:   A BMI below 18.5 is considered underweight.  A BMI of 18.5  to 24.9 is normal.  A BMI of 25 to 29.9 is considered overweight.  A BMI of 30 and above is considered obese.  Watch levels of cholesterol and blood lipids  You should start having your blood tested for lipids and cholesterol at 59 years of age, then have this test every 5 years.  You may need to have your cholesterol levels checked more often if:  Your lipid or cholesterol levels are high.  You are older than 59 years of age.  You are at high risk for heart disease.  CANCER SCREENING   Lung Cancer  Lung cancer screening is recommended for adults 23-55 years old who are at high risk for lung cancer because of a history of smoking.  A yearly low-dose CT scan of the lungs is recommended for people who:  Currently smoke.  Have quit within the past 15 years.  Have at least a 30-pack-year history of smoking. A pack year is smoking an average of one pack of cigarettes a day for 1 year.  Yearly screening should continue until it has been 15 years since you quit.  Yearly screening should stop if you develop a health problem that would prevent you from having lung cancer treatment.  Breast Cancer  Practice breast self-awareness. This means understanding how your breasts normally appear and feel.  It also means doing regular breast self-exams. Let your health care provider know about any changes, no matter how small.  If you are in  your 20s or 30s, you should have a clinical breast exam (CBE) by a health care provider every 1-3 years as part of a regular health exam.  If you are 40 or older, have a CBE every year. Also consider having a breast X-ray (mammogram) every year.  If you have a family history of breast cancer, talk to your health care provider about genetic screening.  If you are at high risk for breast cancer, talk to your health care provider about having an MRI and a mammogram every year.  Breast cancer gene (BRCA) assessment is recommended for women who have  family members with BRCA-related cancers. BRCA-related cancers include:  Breast.  Ovarian.  Tubal.  Peritoneal cancers.  Results of the assessment will determine the need for genetic counseling and BRCA1 and BRCA2 testing. Cervical Cancer Your health care provider may recommend that you be screened regularly for cancer of the pelvic organs (ovaries, uterus, and vagina). This screening involves a pelvic examination, including checking for microscopic changes to the surface of your cervix (Pap test). You may be encouraged to have this screening done every 3 years, beginning at age 21.  For women ages 30-65, health care providers may recommend pelvic exams and Pap testing every 3 years, or they may recommend the Pap and pelvic exam, combined with testing for human papilloma virus (HPV), every 5 years. Some types of HPV increase your risk of cervical cancer. Testing for HPV may also be done on women of any age with unclear Pap test results.  Other health care providers may not recommend any screening for nonpregnant women who are considered low risk for pelvic cancer and who do not have symptoms. Ask your health care provider if a screening pelvic exam is right for you.  If you have had past treatment for cervical cancer or a condition that could lead to cancer, you need Pap tests and screening for cancer for at least 20 years after your treatment. If Pap tests have been discontinued, your risk factors (such as having a new sexual partner) need to be reassessed to determine if screening should resume. Some women have medical problems that increase the chance of getting cervical cancer. In these cases, your health care provider may recommend more frequent screening and Pap tests. Colorectal Cancer  This type of cancer can be detected and often prevented.  Routine colorectal cancer screening usually begins at 59 years of age and continues through 59 years of age.  Your health care provider may  recommend screening at an earlier age if you have risk factors for colon cancer.  Your health care provider may also recommend using home test kits to check for hidden blood in the stool.  A small camera at the end of a tube can be used to examine your colon directly (sigmoidoscopy or colonoscopy). This is done to check for the earliest forms of colorectal cancer.  Routine screening usually begins at age 50.  Direct examination of the colon should be repeated every 5-10 years through 59 years of age. However, you may need to be screened more often if early forms of precancerous polyps or small growths are found. Skin Cancer  Check your skin from head to toe regularly.  Tell your health care provider about any new moles or changes in moles, especially if there is a change in a mole's shape or color.  Also tell your health care provider if you have a mole that is larger than the size of a pencil   eraser.  Always use sunscreen. Apply sunscreen liberally and repeatedly throughout the day.  Protect yourself by wearing long sleeves, pants, a wide-brimmed hat, and sunglasses whenever you are outside. HEART DISEASE, DIABETES, AND HIGH BLOOD PRESSURE   High blood pressure causes heart disease and increases the risk of stroke. High blood pressure is more likely to develop in:  People who have blood pressure in the high end of the normal range (130-139/85-89 mm Hg).  People who are overweight or obese.  People who are African American.  If you are 18-39 years of age, have your blood pressure checked every 3-5 years. If you are 40 years of age or older, have your blood pressure checked every year. You should have your blood pressure measured twice--once when you are at a hospital or clinic, and once when you are not at a hospital or clinic. Record the average of the two measurements. To check your blood pressure when you are not at a hospital or clinic, you can use:  An automated blood pressure  machine at a pharmacy.  A home blood pressure monitor.  If you are between 55 years and 79 years old, ask your health care provider if you should take aspirin to prevent strokes.  Have regular diabetes screenings. This involves taking a blood sample to check your fasting blood sugar level.  If you are at a normal weight and have a low risk for diabetes, have this test once every three years after 59 years of age.  If you are overweight and have a high risk for diabetes, consider being tested at a younger age or more often. PREVENTING INFECTION  Hepatitis B  If you have a higher risk for hepatitis B, you should be screened for this virus. You are considered at high risk for hepatitis B if:  You were born in a country where hepatitis B is common. Ask your health care provider which countries are considered high risk.  Your parents were born in a high-risk country, and you have not been immunized against hepatitis B (hepatitis B vaccine).  You have HIV or AIDS.  You use needles to inject street drugs.  You live with someone who has hepatitis B.  You have had sex with someone who has hepatitis B.  You get hemodialysis treatment.  You take certain medicines for conditions, including cancer, organ transplantation, and autoimmune conditions. Hepatitis C  Blood testing is recommended for:  Everyone born from 1945 through 1965.  Anyone with known risk factors for hepatitis C. Sexually transmitted infections (STIs)  You should be screened for sexually transmitted infections (STIs) including gonorrhea and chlamydia if:  You are sexually active and are younger than 59 years of age.  You are older than 59 years of age and your health care provider tells you that you are at risk for this type of infection.  Your sexual activity has changed since you were last screened and you are at an increased risk for chlamydia or gonorrhea. Ask your health care provider if you are at risk.  If  you do not have HIV, but are at risk, it may be recommended that you take a prescription medicine daily to prevent HIV infection. This is called pre-exposure prophylaxis (PrEP). You are considered at risk if:  You are sexually active and do not regularly use condoms or know the HIV status of your partner(s).  You take drugs by injection.  You are sexually active with a partner who has HIV. Talk with   your health care provider about whether you are at high risk of being infected with HIV. If you choose to begin PrEP, you should first be tested for HIV. You should then be tested every 3 months for as long as you are taking PrEP.  PREGNANCY   If you are premenopausal and you may become pregnant, ask your health care provider about preconception counseling.  If you may become pregnant, take 400 to 800 micrograms (mcg) of folic acid every day.  If you want to prevent pregnancy, talk to your health care provider about birth control (contraception). OSTEOPOROSIS AND MENOPAUSE   Osteoporosis is a disease in which the bones lose minerals and strength with aging. This can result in serious bone fractures. Your risk for osteoporosis can be identified using a bone density scan.  If you are 49 years of age or older, or if you are at risk for osteoporosis and fractures, ask your health care provider if you should be screened.  Ask your health care provider whether you should take a calcium or vitamin D supplement to lower your risk for osteoporosis.  Menopause may have certain physical symptoms and risks.  Hormone replacement therapy may reduce some of these symptoms and risks. Talk to your health care provider about whether hormone replacement therapy is right for you.  HOME CARE INSTRUCTIONS   Schedule regular health, dental, and eye exams.  Stay current with your immunizations.   Do not use any tobacco products including cigarettes, chewing tobacco, or electronic cigarettes.  If you are  pregnant, do not drink alcohol.  If you are breastfeeding, limit how much and how often you drink alcohol.  Limit alcohol intake to no more than 1 drink per day for nonpregnant women. One drink equals 12 ounces of beer, 5 ounces of wine, or 1 ounces of hard liquor.  Do not use street drugs.  Do not share needles.  Ask your health care provider for help if you need support or information about quitting drugs.  Tell your health care provider if you often feel depressed.  Tell your health care provider if you have ever been abused or do not feel safe at home.   This information is not intended to replace advice given to you by your health care provider. Make sure you discuss any questions you have with your health care provider.   Document Released: 09/26/2010 Document Revised: 04/03/2014 Document Reviewed: 02/12/2013 Elsevier Interactive Patient Education [employer].

## 2015-08-19 ENCOUNTER — Telehealth: Payer: Self-pay | Admitting: Family Medicine

## 2015-08-19 DIAGNOSIS — R7303 Prediabetes: Secondary | ICD-10-CM

## 2015-08-19 LAB — VITAMIN D 25 HYDROXY (VIT D DEFICIENCY, FRACTURES): VITD: 28.78 ng/mL — ABNORMAL LOW (ref 30.00–100.00)

## 2015-08-19 NOTE — Telephone Encounter (Signed)
Patient would like to be referred to Nutrition

## 2015-08-19 NOTE — Telephone Encounter (Signed)
Please call pt, her labs have resulted: - Her cholesterol is higher than desired. Diet and Exercise modifications, start a OTC fish oil supplement and we will recheck the levels in 6 months. If continues to be elevated will need to discuss medications at that time.  - Vit D is still mildly low (but better), she should start a 1000 units daily of OTC Vit d and take with a meal.  - Diabetes screen placed her in prediabetes range, we will need to repeat this in 6 months. She could benefit from nutrition referral, if she is willing I will place this order for her. Low carbohydrate/low sugar diet, >150 min exercise a week.  - lastly, her BP was above goal on both readings (she felt it could have been pain related). She is to monitor this at home (or drug store etc). If remains elevated >140/90, she will need to be seen to discuss and start medication for BP.   Results for orders placed or performed in visit on 08/18/15 (from the past 72 hour(s))  Lipid panel     Status: Abnormal   Collection Time: 08/18/15 10:37 AM  Result Value Ref Range   Cholesterol 278 (H) 0 - 200 mg/dL    Comment: ATP III Classification       Desirable:  < 200 mg/dL               Borderline High:  200 - 239 mg/dL          High:  > = 767 mg/dL   Triglycerides 209.4 (H) 0.0 - 149.0 mg/dL    Comment: Normal:  <709 mg/dLBorderline High:  150 - 199 mg/dL   HDL 62.83 >66.29 mg/dL   VLDL 47.6 0.0 - 54.6 mg/dL   LDL Cholesterol 503 (H) 0 - 99 mg/dL   Total CHOL/HDL Ratio 4     Comment:                Men          Women1/2 Average Risk     3.4          3.3Average Risk          5.0          4.42X Average Risk          9.6          7.13X Average Risk          15.0          11.0                       NonHDL 213.90     Comment: NOTE:  Non-HDL goal should be 30 mg/dL higher than patient's LDL goal (i.e. LDL goal of < 70 mg/dL, would have non-HDL goal of < 100 mg/dL)  HgB T4S     Status: None   Collection Time: 08/18/15 10:37 AM  Result  Value Ref Range   Hgb A1c MFr Bld 6.2 4.6 - 6.5 %    Comment: Glycemic Control Guidelines for People with Diabetes:Non Diabetic:  <6%Goal of Therapy: <7%Additional Action Suggested:  >8%   VITAMIN D 25 Hydroxy (Vit-D Deficiency, Fractures)     Status: Abnormal   Collection Time: 08/18/15 10:37 AM  Result Value Ref Range   VITD 28.78 (L) 30.00 - 100.00 ng/mL

## 2015-08-19 NOTE — Addendum Note (Signed)
Addended by: Felix Pacini A on: 08/19/2015 02:31 PM   Modules accepted: Orders

## 2015-08-19 NOTE — Telephone Encounter (Signed)
Left message with lab results and complete instructions on patient voice mail. Patient to call back and let us know if she wants referral to nutritionist.

## 2015-08-27 ENCOUNTER — Encounter: Payer: Self-pay | Admitting: Family Medicine

## 2015-09-01 ENCOUNTER — Ambulatory Visit: Payer: Managed Care, Other (non HMO) | Admitting: Cardiovascular Disease

## 2015-09-20 ENCOUNTER — Ambulatory Visit: Payer: Self-pay | Admitting: Skilled Nursing Facility1

## 2015-09-21 ENCOUNTER — Encounter (HOSPITAL_COMMUNITY): Payer: Self-pay | Admitting: *Deleted

## 2015-09-21 ENCOUNTER — Emergency Department (HOSPITAL_COMMUNITY): Payer: Managed Care, Other (non HMO)

## 2015-09-21 ENCOUNTER — Emergency Department (HOSPITAL_COMMUNITY)
Admission: EM | Admit: 2015-09-21 | Discharge: 2015-09-21 | Disposition: A | Discharge status: Home / Self Care | Payer: Managed Care, Other (non HMO) | Attending: Emergency Medicine | Admitting: Emergency Medicine

## 2015-09-21 ENCOUNTER — Encounter: Payer: Managed Care, Other (non HMO) | Attending: Family Medicine | Admitting: Skilled Nursing Facility1

## 2015-09-21 DIAGNOSIS — R1032 Left lower quadrant pain: Secondary | ICD-10-CM | POA: Diagnosis not present

## 2015-09-21 DIAGNOSIS — Z96611 Presence of right artificial shoulder joint: Secondary | ICD-10-CM | POA: Insufficient documentation

## 2015-09-21 DIAGNOSIS — Z87891 Personal history of nicotine dependence: Secondary | ICD-10-CM | POA: Insufficient documentation

## 2015-09-21 DIAGNOSIS — N189 Chronic kidney disease, unspecified: Secondary | ICD-10-CM | POA: Diagnosis not present

## 2015-09-21 DIAGNOSIS — J45909 Unspecified asthma, uncomplicated: Secondary | ICD-10-CM | POA: Diagnosis not present

## 2015-09-21 LAB — COMPREHENSIVE METABOLIC PANEL
ALBUMIN: 3.8 g/dL (ref 3.5–5.0)
ALT: 21 U/L (ref 14–54)
ANION GAP: 7 (ref 5–15)
AST: 22 U/L (ref 15–41)
Alkaline Phosphatase: 82 U/L (ref 38–126)
BUN: 24 mg/dL — AB (ref 6–20)
CHLORIDE: 103 mmol/L (ref 101–111)
CO2: 26 mmol/L (ref 22–32)
Calcium: 9.5 mg/dL (ref 8.9–10.3)
Creatinine, Ser: 0.85 mg/dL (ref 0.44–1.00)
GFR calc Af Amer: >60 mL/min (ref >60)
Glucose, Bld: 103 mg/dL — ABNORMAL HIGH (ref 65–99)
POTASSIUM: 4.1 mmol/L (ref 3.5–5.1)
Sodium: 136 mmol/L (ref 135–145)
TOTAL PROTEIN: 6.6 g/dL (ref 6.5–8.1)
Total Bilirubin: 0.3 mg/dL (ref 0.3–1.2)

## 2015-09-21 LAB — CBC
HEMATOCRIT: 41 % (ref 36.0–46.0)
HEMOGLOBIN: 13 g/dL (ref 12.0–15.0)
MCH: 27 pg (ref 26.0–34.0)
MCHC: 31.7 g/dL (ref 30.0–36.0)
MCV: 85.1 fL (ref 78.0–100.0)
Platelets: 379 10*3/uL (ref 150–400)
RBC: 4.82 MIL/uL (ref 3.87–5.11)
RDW: 13.7 % (ref 11.5–15.5)
WBC: 12.8 10*3/uL — AB (ref 4.0–10.5)

## 2015-09-21 LAB — I-STAT BETA HCG BLOOD, ED (MC, WL, AP ONLY)

## 2015-09-21 LAB — URINALYSIS, ROUTINE W REFLEX MICROSCOPIC
Bilirubin Urine: NEGATIVE
GLUCOSE, UA: NEGATIVE mg/dL
Hgb urine dipstick: NEGATIVE
Ketones, ur: NEGATIVE mg/dL
NITRITE: NEGATIVE
PH: 5.5 (ref 5.0–8.0)
Protein, ur: NEGATIVE mg/dL
SPECIFIC GRAVITY, URINE: 1.026 (ref 1.005–1.030)

## 2015-09-21 LAB — URINE MICROSCOPIC-ADD ON

## 2015-09-21 LAB — LIPASE, BLOOD: LIPASE: 18 U/L (ref 11–51)

## 2015-09-21 LAB — POC URINE PREG, ED: Preg Test, Ur: POSITIVE — AB

## 2015-09-21 MED ORDER — HYDROCODONE-ACETAMINOPHEN 5-325 MG PO TABS
2.0000 | ORAL_TABLET | Freq: Once | ORAL | Status: AC
  Administered 2015-09-21: 2 via ORAL
  Filled 2015-09-21: qty 2

## 2015-09-21 MED ORDER — MORPHINE SULFATE (PF) 4 MG/ML IV SOLN
6.0000 mg | Freq: Once | INTRAVENOUS | Status: AC
  Administered 2015-09-21: 6 mg via INTRAVENOUS
  Filled 2015-09-21: qty 2

## 2015-09-21 MED ORDER — SODIUM CHLORIDE 0.9 % IV BOLUS (SEPSIS)
500.0000 mL | Freq: Once | INTRAVENOUS | Status: AC
  Administered 2015-09-21: 500 mL via INTRAVENOUS

## 2015-09-21 NOTE — Progress Notes (Signed)
Reported from Georges Mouse: Front office Associate; Efraim Kaufmann states the pt did not seem to be in immediate distress) Pt arrived early for her appointment and then checked in. 5 minutes after sitting down from checking in pt came went up to office window to tell Melissa she needs to cancel because she needs to go to the emergency room. Melissa asked if she needs her to call an ambulance but pt declined.  Reported from [place]; front office associate; Pt states I will drive myself to the emergency room but I am in too much pain. Rinaldo Cloud suggested the ambulance is called for her and the pt accepted this. Pam asked the pt on a scale of one to ten how bad is the pain and where is it located. The pt replied the pain was a 10 and located in her lower left abdomen with pain radiating to her back. Rinaldo Cloud escorted the pt back to [employer] office and asked Clydie Braun RD to sit with the pt while Pincus Large called 911.  Clydie Braun sat with pt and comforted the pt while she waited for the EMT.  The Thereasa Parkin was informed of the events after she finished with her previous pt and also sat with the pt while she waited for the EMT. Pt talked of her pain and physical ailments including taking "chinese herbs for her pain because her husband is [place]" while waiting. Providers just listened and offered support. Two EMT arrived and transported the pt across the street to the hospital.

## 2015-09-21 NOTE — Discharge Instructions (Signed)
If you were given medicines take as directed.  If you are on coumadin or contraceptives realize their levels and effectiveness is altered by many different medicines.  If you have any reaction (rash, tongues swelling, other) to the medicines stop taking and see a physician.    If your blood pressure was elevated in the ER make sure you follow up for management with a primary doctor or return for chest pain, shortness of breath or stroke symptoms.  Please follow up as directed and return to the ER or see a physician for new or worsening symptoms.  Thank you. Filed Vitals:   09/21/15 1708  BP: 134/88  Pulse: 76  Temp: 98.4 F (36.9 C)  TempSrc: Oral  Resp: 15  SpO2: 99%

## 2015-09-21 NOTE — ED Provider Notes (Signed)
CSN: [id number]     Arrival date & time 09/21/15  1657 History   First MD Initiated Contact with Patient 09/21/15 1705     Chief Complaint  Patient presents with  . Abdominal Pain     (Consider location/radiation/quality/duration/timing/severity/associated sxs/prior Treatment) HPI Comments: 60 year old female with fibromyalgia, rheumatoid arthritis, lumbar fusion, chronic pain presents with recurrent left lower quadrant abdominal pain for 3 days. Patient has kidney stone and ovarian cyst history. This feels similar to that. No fevers or chills. No vomiting. Pain fairly constant this time.  Patient is a 60 y.o. female presenting with abdominal pain. The history is provided by the patient.  Abdominal Pain Associated symptoms: no chest pain, no chills, no dysuria, no fever, no shortness of breath and no vomiting     Past Medical History  Diagnosis Date  . Fibromyalgia   . 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 Arthritis   . Rheumatoid arthritis(714.0)     autoimmune arthritis; methotrexate once/week  . Depression     takes meds daily  . Post-nasal drip     hx of  . Other specified rheumatoid arthritis, right shoulder (HCC) 08/01/2011  . Allergy   . Chronic kidney disease   . Urinary incontinence   . Family history of malignant neoplasm of breast   . Chronic pain     goes to [facility] for pain control  . Cellulitis of breast 11/2013    RIGHT BREAST  . Anxiety   . ADD (attention deficit disorder)     on Adderal  . Chronic fatigue and immune dysfunction syndrome   . Asthma     as a child  . Aggressive behavior of adult   . Nephrolithiasis   . Cold sore   . Osteoporosis    Past Surgical History  Procedure Laterality Date  . Shoulder surgery  2012    right  . Back surgery  2011    lower back fusion l4-5, s1  . Bladder surgery  2009  . Tubal ligation  1988  . Breast enhancement surgery  2003  . Liposuction  2003     abdominal  . Tonsillectomy  1987  . Total shoulder arthroplasty  08/01/2011    Procedure: TOTAL SHOULDER ARTHROPLASTY;  Surgeon: Eulas Post, MD;  Location: [facility] OR;  Service: Orthopedics;  Laterality: Right;  Right total shoulder arthroplasty  . Mass excision  11/03/2011    Procedure: MINOR EXCISION OF MASS;  Surgeon: Wyn Forster., MD;  Location: [facility];  Service: Orthopedics;  Laterality: Left;  debride IP joint, cyst excision left index  . Breast surgery  10/2013    REMOVAL OF BREAST IMPLANTS  . Breast implants removed    . Incision and drainage abscess Right 01/16/2014    Procedure: INCISION AND DRAINAGE AND OF RIGHT BREAST ABCESS;  Surgeon: Glenna Fellows, MD;  Location: [facility];  Service: General;  Laterality: Right;  . Anterior cervical decomp/discectomy fusion N/A 03/15/2015    Procedure: Cervical five-six, Cerival six-seven, Anterior Cervical Discectomy and Fusion, Allograft and Plate;  Surgeon: Eldred Manges, MD;  Location: [facility] OR;  Service: Orthopedics;  Laterality: N/A;   Family History  Problem Relation Age of Onset  . Arthritis Mother   . Heart disease Mother     ?psvt  . Breast cancer Mother 65    TAH/BSO  . Cancer Mother   . Mental illness Mother   .  COPD Father   . Hypertension Father   . Alcohol abuse Father   . Mental illness Father   . Heart disease Father   . Breast cancer Maternal Aunt 27    deceased  . Cancer Cousin 85    female; unknown primary  . Colon cancer Paternal Aunt 45    deceased at 15  . Stomach cancer Paternal Uncle 44    deceased at 5  . Alcohol abuse Brother   . Cancer Brother   . Hodgkin's lymphoma Brother   . Mental illness Brother    Social History  Substance Use Topics  . Smoking status: Former Smoker -- 0.50 packs/day for 10 years    Types: Cigarettes  . Smokeless tobacco: Never Used     Comment: smoked for 10 years  . Alcohol Use: No     Comment: denied alcohol   OB History    No data available      Review of Systems  Constitutional: Negative for fever and chills.  HENT: Negative for congestion.   Eyes: Negative for visual disturbance.  Respiratory: Negative for shortness of breath.   Cardiovascular: Negative for chest pain.  Gastrointestinal: Positive for abdominal pain. Negative for vomiting and blood in stool.  Genitourinary: Negative for dysuria and flank pain.  Musculoskeletal: Negative for back pain, neck pain and neck stiffness.  Skin: Negative for rash.  Neurological: Negative for light-headedness and headaches.      Allergies  Aspirin; Ibuprofen; Ketamine; Ativan; and Tramadol hcl  Home Medications    BP 134/88 mmHg  Pulse 76  Temp(Src) 98.4 F (36.9 C) (Oral)  Resp 15  SpO2 99% Physical Exam  Constitutional: She is oriented to person, place, and time. She appears well-developed and well-nourished.  HENT:  Head: Normocephalic and atraumatic.  Eyes: Conjunctivae are normal. Right eye exhibits no discharge. Left eye exhibits no discharge.  Neck: Normal range of motion. Neck supple. No tracheal deviation present.  Cardiovascular: Normal rate and regular rhythm.   Pulmonary/Chest: Effort normal and breath sounds normal.  Abdominal: Soft. She exhibits no distension. There is tenderness (ild left lower quadrant lateral). There is no guarding.  Musculoskeletal: She exhibits no edema.  Neurological: She is alert and oriented to person, place, and time.  Skin: Skin is warm. No rash noted.  Psychiatric: She has a normal mood and affect.  Nursing note and vitals reviewed.   ED Course  Procedures (including critical care time) Labs Review Labs Reviewed  COMPREHENSIVE METABOLIC PANEL - Abnormal; Notable for the following:    Glucose, Bld 103 (*)    BUN 24 (*)    All other components within normal limits  CBC - Abnormal; Notable for the following:    WBC 12.8 (*)    All other components within normal limits  URINALYSIS, ROUTINE W REFLEX MICROSCOPIC (NOT AT  [facility]) - Abnormal; Notable for the following:    APPearance CLOUDY (*)    Leukocytes, UA SMALL (*)    All other components within normal limits  URINE MICROSCOPIC-ADD ON - Abnormal; Notable for the following:    Squamous Epithelial / LPF 0-5 (*)    Bacteria, UA RARE (*)    All other components within normal limits  POC URINE PREG, ED - Abnormal; Notable for the following:    Preg Test, Ur POSITIVE (*)    All other components within normal limits  LIPASE, BLOOD  I-STAT BETA HCG BLOOD, ED ([facility], [facility], [facility] ONLY)    Imaging Review Ct Renal  Stone Study  09/21/2015  CLINICAL DATA:  Left lower quadrant pain for several days EXAM: CT ABDOMEN AND PELVIS WITHOUT CONTRAST TECHNIQUE: Multidetector CT imaging of the abdomen and pelvis was performed following the standard protocol without IV contrast. COMPARISON:  07/18/2014 FINDINGS: Lower chest:  Within normal limits. Hepatobiliary: A 1.6 cm liver cyst is noted in the tip of the right lobe stable from the prior exam. The gallbladder is within normal limits. Pancreas: Within normal limits. Spleen: Within normal limits. Adrenals/Urinary Tract: Within normal limits. Stomach/Bowel: The appendix is within normal limits. Scattered mild diverticular change of the colon is seen without diverticulitis. No significant obstructive changes are noted. Vascular/Lymphatic: Within normal limits. Reproductive: Within normal limits. Musculoskeletal: Significant postsurgical changes are noted in the lumbar spine. No acute hardware failure is seen. No new focal bony abnormality is noted. IMPRESSION: Chronic changes as described above. No acute abnormality noted. Electronically Signed   By: Alcide Clever M.D.   On: 09/21/2015 18:49   I have personally reviewed and evaluated these images and lab results as part of my medical decision-making.   EKG Interpretation None      MDM   Final diagnoses:  Abdominal pain, left lower quadrant   Patient presents with concern for possible  kidney stoma sharp left lower flank quadrant pain versus ovarian pathology. Patient has overall benign abdominal exam. Screening CT stone study performed no acute findings. Discussed follow-up with gynecology and primary doctor. Pain meds in the ER. Urinalysis unremarkable.  Results and differential diagnosis were discussed with the patient/parent/guardian. Xrays were independently reviewed by myself.  Close follow up outpatient was discussed, comfortable with the plan.   Medications  HYDROcodone-acetaminophen (NORCO/VICODIN) 5-325 MG per tablet 2 tablet (not administered)  sodium chloride 0.9 % bolus 500 mL (0 mLs Intravenous Stopped 09/21/15 2035)  morphine 4 MG/ML injection 6 mg (6 mg Intravenous Given 09/21/15 1810)    Filed Vitals:   09/21/15 1708 09/21/15 2000  BP: 134/88 127/63  Pulse: 76 76  Temp: 98.4 F (36.9 C)   TempSrc: Oral   Resp: 15 13  SpO2: 99% 97%    Final diagnoses:  Abdominal pain, left lower quadrant        Blane Ohara, MD 09/21/15 2035

## 2015-09-21 NOTE — ED Notes (Signed)
Pt arrives via EMS from nutritionist office. Pt c/o LLQ abd pain x 3 days that radiates to her back. Also endorses nausea and diarrhea. Reports hx kidney stones and ruptured ovarian cysts.

## 2015-09-24 ENCOUNTER — Ambulatory Visit: Payer: Managed Care, Other (non HMO) | Admitting: Cardiovascular Disease

## 2015-09-24 ENCOUNTER — Encounter: Payer: Self-pay | Admitting: *Deleted

## 2015-09-27 ENCOUNTER — Encounter: Payer: Self-pay | Admitting: *Deleted

## 2015-09-30 ENCOUNTER — Telehealth: Payer: Self-pay | Admitting: *Deleted

## 2015-09-30 NOTE — Telephone Encounter (Signed)
Left message for patient received request for Vit D2 50,000 from pharmacy. Per Dr Claiborne Billings notes patient is supposed to be taking Vit D3 1000 units daily. Left information on patient voice mail. Declined refill request for D2 50,000 units.

## 2015-11-11 ENCOUNTER — Encounter (HOSPITAL_COMMUNITY): Payer: Self-pay | Admitting: *Deleted

## 2015-11-11 ENCOUNTER — Emergency Department (HOSPITAL_COMMUNITY)
Admission: EM | Admit: 2015-11-11 | Discharge: 2015-11-11 | Disposition: A | Discharge status: Home / Self Care | Payer: Managed Care, Other (non HMO) | Attending: Emergency Medicine | Admitting: Emergency Medicine

## 2015-11-11 DIAGNOSIS — N189 Chronic kidney disease, unspecified: Secondary | ICD-10-CM | POA: Insufficient documentation

## 2015-11-11 DIAGNOSIS — M25551 Pain in right hip: Secondary | ICD-10-CM | POA: Insufficient documentation

## 2015-11-11 DIAGNOSIS — Z79899 Other long term (current) drug therapy: Secondary | ICD-10-CM | POA: Diagnosis not present

## 2015-11-11 DIAGNOSIS — J45909 Unspecified asthma, uncomplicated: Secondary | ICD-10-CM | POA: Diagnosis not present

## 2015-11-11 DIAGNOSIS — Z96611 Presence of right artificial shoulder joint: Secondary | ICD-10-CM | POA: Diagnosis not present

## 2015-11-11 DIAGNOSIS — Z87891 Personal history of nicotine dependence: Secondary | ICD-10-CM | POA: Diagnosis not present

## 2015-11-11 MED ORDER — OXYCODONE HCL 5 MG PO TABS
5.0000 mg | ORAL_TABLET | Freq: Four times a day (QID) | ORAL | 0 refills | Status: DC | PRN
Start: 2015-11-11 — End: 2015-12-01

## 2015-11-11 MED ORDER — OXYCODONE-ACETAMINOPHEN 5-325 MG PO TABS
1.0000 | ORAL_TABLET | Freq: Once | ORAL | Status: AC
  Administered 2015-11-11: 1 via ORAL
  Filled 2015-11-11: qty 1

## 2015-11-11 NOTE — ED Provider Notes (Signed)
[facility] DEPT Provider Note   CSN: [id number] Arrival date & time: 11/11/15  1149   By signing my name below, I, Kara [name], attest that this documentation has been prepared under the direction and in the presence of non-physician practitioner, Shary Decamp, PA-C. Electronically Signed: Evelene [name], Scribe. 11/11/2015. 12:44 PM.   History   Chief Complaint Chief Complaint  Patient presents with  . Hip Pain     The history is provided by the patient. No language interpreter was used.    HPI Comments:  Kara [name] is a 60 y.o. female with a history of RA and chronic pain, who presents to the Emergency Department complaining of 10/10, right hip pain x a few days. She notes the pain radiates down the RLE extremity and into her back. Her pain is exacerbated when she bears weight. Pt states she fell at the beginning of August 2017 and injured the left hip; she followed up with Dr. Lorin Mercy (orthopedics) and received an injection with relief of that pain. She recently had negative XR of the right hip. She denies numbness/weakness in her extremities.   Past Medical History:  Diagnosis Date  . ADD (attention deficit disorder)    on Adderal  . Aggressive behavior of adult   . Allergy   . Anxiety   . Arthritis   . Asthma    as a child  . Cellulitis of breast 11/2013   RIGHT BREAST  . Chronic fatigue and immune dysfunction syndrome   . Chronic kidney disease   . Chronic pain    goes to [facility] for pain control  . Cold sore   . 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 Depression    takes meds daily  . Family history of malignant neoplasm of breast   . Fibromyalgia   . Nephrolithiasis   . Osteoporosis   . Other specified rheumatoid arthritis, right shoulder ([place]) 08/01/2011  . Post-nasal drip    hx of  . Rheumatoid arthritis(714.0)    autoimmune arthritis; methotrexate once/week  . Urinary incontinence     Patient  Active Problem List   Diagnosis Date Noted  . Prediabetes 08/19/2015  . BMI 26.0-26.9,adult 08/18/2015  . Chronic pain 08/18/2015  . Vitamin D deficiency 08/18/2015  . Atypical chest pain 08/18/2015  . Elevated BP 08/18/2015  . Chronic fatigue disorder 06/14/2015  . Nail fungus 06/14/2015  . S/P cervical spinal fusion 03/15/2015  . S/P lumbar fusion 07/03/2014  . PTSD (post-traumatic stress disorder) 03/07/2014  . Severe recurrent major depression without psychotic features ([place]) 03/06/2014  . Suicide threat or attempt 03/06/2014  . Agitation 03/06/2014  . Aggressive behavior   . Family history of malignant neoplasm of breast   . Rheumatoid arthritis ([place]) 12/13/2011  . Fibromyalgia 12/13/2011    Past Surgical History:  Procedure Laterality Date  . ANTERIOR CERVICAL DECOMP/DISCECTOMY FUSION N/A 03/15/2015   Procedure: Cervical five-six, Cerival six-seven, Anterior Cervical Discectomy and Fusion, Allograft and Plate;  Surgeon: Marybelle Killings, MD;  Location: [facility];  Service: Orthopedics;  Laterality: N/A;  . BACK SURGERY  2011   lower back fusion l4-5, s1  . BLADDER SURGERY  2009  . BREAST ENHANCEMENT SURGERY  2003  . breast implants removed    . BREAST SURGERY  10/2013   REMOVAL OF BREAST IMPLANTS  . INCISION AND DRAINAGE ABSCESS Right 01/16/2014   Procedure: INCISION AND DRAINAGE AND OF RIGHT BREAST ABCESS;  Surgeon: Excell Seltzer, MD;  Location: [facility];  Service: General;  Laterality: Right;  . LIPOSUCTION  2003   abdominal  . MASS EXCISION  11/03/2011   Procedure: MINOR EXCISION OF MASS;  Surgeon: Cammie Sickle., MD;  Location: [facility];  Service: Orthopedics;  Laterality: Left;  debride IP joint, cyst excision left index  . SHOULDER SURGERY  2012   right  . TONSILLECTOMY  1987  . TOTAL SHOULDER ARTHROPLASTY  08/01/2011   Procedure: TOTAL SHOULDER ARTHROPLASTY;  Surgeon: Johnny Bridge, MD;  Location: [facility];  Service: Orthopedics;  Laterality: Right;   Right total shoulder arthroplasty  . TUBAL LIGATION  1988    OB History    No data available       Home Medications    Prior to Admission medications   Medication Sig Start Date End Date Taking? Authorizing Provider  ALPRAZolam Duanne Moron) 1 MG tablet Take 1 mg by mouth 2 (two) times daily as needed for anxiety.  07/06/14   Historical Provider, MD  amphetamine-dextroamphetamine (ADDERALL) 30 MG tablet Take 30 mg by mouth 3 (three) times daily. 06/05/14   Historical Provider, MD  BIOTIN PO Take 1 tablet by mouth daily.    Historical Provider, MD  diclofenac sodium (VOLTAREN) 1 % GEL Apply 1 application topically every 4 (four) hours as needed (arthritis pain). Must be name brand 01/13/15   Historical Provider, MD  Levomilnacipran HCl ER (FETZIMA) 120 MG CP24 Take 120 mg by mouth daily.    Historical Provider, MD  Magnesium 500 MG TABS Take 5,000 mg by mouth daily.    Historical Provider, MD  meloxicam (MOBIC) 15 MG tablet TAKE 1 TABLET (15 MG TOTAL) BY MOUTH DAILY. Patient taking differently: Take 15 mg by mouth daily.  11/12/14   Eulas Post, MD  OVER THE COUNTER MEDICATION Place 1 drop into both eyes daily as needed (dry eyes). [facility] redness relief maximum: dextran 70 0.1%, polyethylene glycol 400 1%, povidine 1%, tetrahydrozoline hcl 0.05%    Historical Provider, MD  tretinoin (RETIN-A) 0.1 % cream Apply 1 application topically at bedtime. 90 Day Supply 08/18/15   Renee A Kuneff, DO  Tretinoin-Cleanser-Moisturizer (TRETIN-X) 0.1 % CREAM KIT Apply at bedtime Patient not taking: Reported on 08/18/2015 01/14/15   Eulas Post, MD  venlafaxine XR (EFFEXOR-XR) 75 MG 24 hr capsule Take 150 mg by mouth daily with breakfast.     Historical Provider, MD  zolpidem (AMBIEN) 10 MG tablet TAKE 1 TABLET BY MOUTH AT BEDTIME *REFILL DATES ARE 11/20, 12/20, 1/19 05/23/15   Historical Provider, MD    Family History Family History  Problem Relation Age of Onset  . Arthritis Mother   . Heart disease  Mother     ?psvt  . Breast cancer Mother 64    TAH/BSO  . Cancer Mother   . Mental illness Mother   . COPD Father   . Hypertension Father   . Alcohol abuse Father   . Mental illness Father   . Heart disease Father   . Breast cancer Maternal Aunt 27    deceased  . Cancer Cousin 3    female; unknown primary  . Colon cancer Paternal Aunt 64    deceased at 73  . Stomach cancer Paternal Uncle 104    deceased at 71  . Alcohol abuse Brother   . Cancer Brother   . Hodgkin's lymphoma Brother   . Mental illness Brother     Social History  Social History  Substance Use Topics  . Smoking status: Former Smoker    Packs/day: 0.50    Years: 10.00    Types: Cigarettes  . Smokeless tobacco: Never Used     Comment: smoked for 10 years  . Alcohol use No     Comment: denied alcohol     Allergies   Aspirin; Ibuprofen; Ketamine; Ativan [lorazepam]; and Tramadol hcl   Review of Systems Review of Systems  Genitourinary: Negative for vaginal bleeding and vaginal discharge.  Musculoskeletal: Positive for arthralgias, back pain and myalgias.  Neurological: Negative for weakness and numbness.    Physical Exam Updated Vital Signs BP 155/88 (BP Location: Right Arm)   Pulse 88   Temp 98.8 F (37.1 C) (Oral)   SpO2 99%   Physical Exam  Constitutional: She is oriented to person, place, and time. She appears well-developed and well-nourished. No distress.  HENT:  Head: Normocephalic and atraumatic.  Eyes: Conjunctivae are normal.  Cardiovascular: Normal rate.   Pulmonary/Chest: Effort normal.  Abdominal: Soft. She exhibits no distension and no mass. There is no tenderness. There is no rebound and no guarding.  Musculoskeletal:  Full internal/ external rotation of right hip without pain  TTP on lateral aspect of right hip  NVI  Motor and sensation intact distally  Neurological: She is alert and oriented to person, place, and time.  Skin: Skin is warm and dry.  Psychiatric: She  has a normal mood and affect.  Nursing note and vitals reviewed.  ED Treatments / Results  DIAGNOSTIC STUDIES:  Oxygen Saturation is 99% on RA, normal by my interpretation.    COORDINATION OF CARE:  12:40 PM Discussed treatment plan with pt at bedside and pt agreed to plan.  Labs (all labs ordered are listed, but only abnormal results are displayed) Labs Reviewed - No data to display  EKG  EKG Interpretation None      Radiology No results found.  Procedures Procedures (including critical care time)  Medications Ordered in ED Medications - No data to display   Initial Impression / Assessment and Plan / ED Course  I have reviewed the triage vital signs and the nursing notes.  Pertinent labs & imaging results that were available during my care of the patient were reviewed by me and considered in my medical decision making (see chart for details).  Clinical Course    Final Clinical Impressions(s) / ED Diagnoses  I have reviewed and evaluated the relevant imaging studies.  I have reviewed the relevant previous healthcare records. I obtained HPI from historian.  ED Course:  Assessment: Pt is a 60yF who presents with right hip pain since the beginning of August. Recent left hip steroid injection with Ortho. Notes pain only with ambulation. None at rest. On exam, pt in NAD. Nontoxic/nonseptic appearing. VSS. Afebrile. Full ROM of right hip joint with intact internal and external rotation without pain. TTP along lateral aspect. Given analgesia in ED. Likely musculoskeletal in etiology involving possible bursa or IT band. withholding steroids as pt received injetion at the beginning of this month. Pt allergic to Aspirin, Ibuprofen as documented anaphylaxis, which patient confirms. I have reviewed the [place] Controlled Substance Reporting System.  . Given Rx Oxycodone #7. Plan is to DC home with follow up to Ortho practice. At time of discharge, Patient is in no acute  distress. Vital Signs are stable. Patient is able to ambulate. Patient able to tolerate PO.    Disposition/Plan:  DC home Additional Verbal  discharge instructions given and discussed with patient.  Pt Instructed to f/u with Ortho in the next week for evaluation and treatment of symptoms. Return precautions given Pt acknowledges and agrees with plan  Supervising Physician Leo Grosser, MD   Final diagnoses:  Right hip pain    New Prescriptions New Prescriptions   No medications on file   I personally performed the services described in this documentation, which was scribed in my presence. The recorded information has been reviewed and is accurate.     Shary Decamp, PA-C 11/11/15 1307    Leo Grosser, MD 11/12/15 [phone]

## 2015-11-11 NOTE — ED Triage Notes (Signed)
Pt reports having arthritis. Pt was seen here last for left hip pain and referred to ortho md who did injection. Pt now has same pain on right side.

## 2015-11-11 NOTE — ED Notes (Signed)
Declined W/C at D/C and was escorted to lobby by RN. 

## 2015-11-11 NOTE — Discharge Instructions (Signed)
Please read and follow all provided instructions.  Your diagnoses today include:  1. Right hip pain    Tests performed today include: Vital signs. See below for your results today.   Medications prescribed:  Take as prescribed   Home care instructions:  Follow any educational materials contained in this packet.  Follow-up instructions: Please follow-up with your Orthopedic provider for further evaluation of symptoms and treatment   Return instructions:  Please return to the Emergency Department if you do not get better, if you get worse, or new symptoms OR  - Fever (temperature greater than 101.70F)  - Bleeding that does not stop with holding pressure to the area    -Severe pain (please note that you may be more sore the day after your accident)  - Chest Pain  - Difficulty breathing  - Severe nausea or vomiting  - Inability to tolerate food and liquids  - Passing out  - Skin becoming red around your wounds  - Change in mental status (confusion or lethargy)  - New numbness or weakness    Please return if you have any other emergent concerns.  Additional Information:  Your vital signs today were: BP 155/88 (BP Location: Right Arm)    Pulse 88    Temp 98.8 F (37.1 C) (Oral)    SpO2 99%  If your blood pressure (BP) was elevated above 135/85 this visit, please have this repeated by your doctor within one month. --------------

## 2015-11-14 ENCOUNTER — Encounter (HOSPITAL_COMMUNITY): Payer: Self-pay | Admitting: Emergency Medicine

## 2015-11-14 ENCOUNTER — Emergency Department (HOSPITAL_COMMUNITY)
Admission: EM | Admit: 2015-11-14 | Discharge: 2015-11-14 | Disposition: A | Discharge status: Home / Self Care | Payer: Managed Care, Other (non HMO) | Attending: Emergency Medicine | Admitting: Emergency Medicine

## 2015-11-14 ENCOUNTER — Emergency Department (HOSPITAL_COMMUNITY): Payer: Managed Care, Other (non HMO)

## 2015-11-14 DIAGNOSIS — Z79899 Other long term (current) drug therapy: Secondary | ICD-10-CM | POA: Diagnosis not present

## 2015-11-14 DIAGNOSIS — M79604 Pain in right leg: Secondary | ICD-10-CM | POA: Diagnosis not present

## 2015-11-14 DIAGNOSIS — J45909 Unspecified asthma, uncomplicated: Secondary | ICD-10-CM | POA: Diagnosis not present

## 2015-11-14 DIAGNOSIS — Z96611 Presence of right artificial shoulder joint: Secondary | ICD-10-CM | POA: Insufficient documentation

## 2015-11-14 DIAGNOSIS — M79605 Pain in left leg: Secondary | ICD-10-CM | POA: Diagnosis present

## 2015-11-14 DIAGNOSIS — F17210 Nicotine dependence, cigarettes, uncomplicated: Secondary | ICD-10-CM | POA: Diagnosis not present

## 2015-11-14 DIAGNOSIS — M5442 Lumbago with sciatica, left side: Secondary | ICD-10-CM

## 2015-11-14 DIAGNOSIS — M5440 Lumbago with sciatica, unspecified side: Secondary | ICD-10-CM | POA: Insufficient documentation

## 2015-11-14 DIAGNOSIS — M5441 Lumbago with sciatica, right side: Secondary | ICD-10-CM

## 2015-11-14 MED ORDER — CYCLOBENZAPRINE HCL 10 MG PO TABS
10.0000 mg | ORAL_TABLET | Freq: Once | ORAL | Status: AC
  Administered 2015-11-14: 10 mg via ORAL
  Filled 2015-11-14: qty 1

## 2015-11-14 MED ORDER — HYDROMORPHONE HCL 1 MG/ML IJ SOLN
1.0000 mg | Freq: Once | INTRAMUSCULAR | Status: AC
  Administered 2015-11-14: 1 mg via INTRAMUSCULAR
  Filled 2015-11-14: qty 1

## 2015-11-14 MED ORDER — DEXAMETHASONE 4 MG PO TABS
10.0000 mg | ORAL_TABLET | Freq: Once | ORAL | Status: AC
  Administered 2015-11-14: 10 mg via ORAL
  Filled 2015-11-14: qty 3

## 2015-11-14 MED ORDER — OXYCODONE-ACETAMINOPHEN 5-325 MG PO TABS: 2.0000 | ORAL_TABLET | ORAL | 0 refills | Status: DC | PRN

## 2015-11-14 MED ORDER — CYCLOBENZAPRINE HCL 10 MG PO TABS: 10.0000 mg | ORAL_TABLET | Freq: Two times a day (BID) | ORAL | 0 refills | Status: DC | PRN

## 2015-11-14 NOTE — ED Triage Notes (Signed)
Pt reports hx of chronic back pain, hx of surgery on back and neck. PT reports lower back pain to sacral area radiating down legs, pt reports it feels like sciatica pain. Pt reports seen here several days ago for same, did not have MRI done. Pt reports pain has gotten worse. Pt reports she was sent home with pain meds but they are not strong enough.

## 2015-11-14 NOTE — ED Notes (Signed)
Refused DC vitals.

## 2015-11-14 NOTE — ED Provider Notes (Signed)
[facility] DEPT Provider Note   CSN: [id number] Arrival date & time: 11/14/15  0814     History   Chief Complaint Chief Complaint  Patient presents with  . Back Pain    HPI Kara [name] is a 60 y.o. female.  HPI  60 year old female with a history of fibromyalgia, rheumatoid arthritis, chronic pain, anterior cervical discectomy in 2016, lumbar fusion, PTSD, depression presents with concern for worsening bilateral leg pain and weakness. Patient had been seen by her orthopedic provider as well as the emergency department, however reports that last night her pain significantly worsened, in addition, she's had difficulty ambulating, stating that her bottom little "drop" when she stands up, and has sensation of worsening weakness in her legs. Denies falls.  Is unable to describe what the "drop" is.  Reports a throbbing pain going down her right leg, severe. Reports she has one more pain medication and she will be out and that her pain is 10/10.  Pain medicines provide marginal relief.  Wants to know if something can be done. Denies fevers, loss control bowel or bladder, no hx of cancer, no hx of IVDU.     Past Medical History:  Diagnosis Date  . ADD (attention deficit disorder)    on Adderal  . Aggressive behavior of adult   . Allergy   . Anxiety   . Arthritis   . Asthma    as a child  . Cellulitis of breast 11/2013   RIGHT BREAST  . Chronic fatigue and immune dysfunction syndrome   . Chronic kidney disease   . Chronic pain    goes to [facility] for [facility] control  . Cold sore   . 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 Depression    takes meds daily  . Family history of malignant neoplasm of breast   . Fibromyalgia   . Nephrolithiasis   . Osteoporosis   . Other specified rheumatoid arthritis, right shoulder (HCC) 08/01/2011  . Post-nasal drip    hx of  . Rheumatoid arthritis(714.0)    autoimmune arthritis; methotrexate  once/week  . Urinary incontinence     Patient Active Problem List   Diagnosis Date Noted  . Prediabetes 08/19/2015  . BMI 26.0-26.9,adult 08/18/2015  . Chronic pain 08/18/2015  . Vitamin D deficiency 08/18/2015  . Atypical chest pain 08/18/2015  . Elevated BP 08/18/2015  . Chronic fatigue disorder 06/14/2015  . Nail fungus 06/14/2015  . S/P cervical spinal fusion 03/15/2015  . S/P lumbar fusion 07/03/2014  . PTSD (post-traumatic stress disorder) 03/07/2014  . Severe recurrent major depression without psychotic features (HCC) 03/06/2014  . Suicide threat or attempt 03/06/2014  . Agitation 03/06/2014  . Aggressive behavior   . Family history of malignant neoplasm of breast   . Rheumatoid arthritis (HCC) 12/13/2011  . Fibromyalgia 12/13/2011    Past Surgical History:  Procedure Laterality Date  . ANTERIOR CERVICAL DECOMP/DISCECTOMY FUSION N/A 03/15/2015   Procedure: Cervical five-six, Cerival six-seven, Anterior Cervical Discectomy and Fusion, Allograft and Plate;  Surgeon: Eldred Manges, MD;  Location: [facility];  Service: Orthopedics;  Laterality: N/A;  . BACK SURGERY  2011   lower back fusion l4-5, s1  . BLADDER SURGERY  2009  . BREAST ENHANCEMENT SURGERY  2003  . breast implants removed    . BREAST SURGERY  10/2013   REMOVAL OF BREAST IMPLANTS  . INCISION AND DRAINAGE ABSCESS Right 01/16/2014   Procedure: INCISION  AND DRAINAGE AND OF RIGHT BREAST ABCESS;  Surgeon: Glenna Fellows, MD;  Location: [facility];  Service: General;  Laterality: Right;  . LIPOSUCTION  2003   abdominal  . MASS EXCISION  11/03/2011   Procedure: MINOR EXCISION OF MASS;  Surgeon: Wyn Forster., MD;  Location: [facility];  Service: Orthopedics;  Laterality: Left;  debride IP joint, cyst excision left index  . SHOULDER SURGERY  2012   right  . TONSILLECTOMY  1987  . TOTAL SHOULDER ARTHROPLASTY  08/01/2011   Procedure: TOTAL SHOULDER ARTHROPLASTY;  Surgeon: Eulas Post, MD;  Location:  [facility] OR;  Service: Orthopedics;  Laterality: Right;  Right total shoulder arthroplasty  . TUBAL LIGATION  1988    OB History    No data available       Home Medications    Prior to Admission medications   Medication Sig Start Date End Date Taking? Authorizing Provider  ADDERALL XR 30 MG 24 hr capsule Take 1 capsule by mouth 2 (two) times daily. Pt takes at 0800 and at [facility] 11/02/15  Yes Historical Provider, MD  ALPRAZolam Prudy Feeler) 1 MG tablet Take 1 mg by mouth 2 (two) times daily as needed for anxiety.  07/06/14  Yes Historical Provider, MD  amphetamine-dextroamphetamine (ADDERALL) 30 MG tablet Take 30 mg by mouth See admin instructions. Pt takes at 1600 as needed 06/05/14  Yes Historical Provider, MD  diclofenac sodium (VOLTAREN) 1 % GEL Apply 1 application topically every 4 (four) hours as needed (arthritis pain). Must be name brand 01/13/15  Yes Historical Provider, MD  Magnesium 500 MG TABS Take 500 mg by mouth daily.    Yes Historical Provider, MD  meloxicam (MOBIC) 15 MG tablet TAKE 1 TABLET (15 MG TOTAL) BY MOUTH DAILY. Patient taking differently: Take 15 mg by mouth daily.  11/12/14  Yes [name], MD  oxyCODONE (ROXICODONE) 5 MG immediate release tablet Take 1 tablet (5 mg total) by mouth every 6 (six) hours as needed for severe pain. 11/11/15  Yes Audry Pili, PA-C  tretinoin (RETIN-A) 0.1 % cream Apply 1 application topically at bedtime. 90 Day Supply 08/18/15  Yes Renee A Kuneff, DO  venlafaxine XR (EFFEXOR-XR) 75 MG 24 hr capsule Take 75 mg by mouth daily with breakfast.    Yes Historical Provider, MD  zolpidem (AMBIEN) 10 MG tablet TAKE 1 TABLET BY MOUTH AT BEDTIME AS NEEDED 05/23/15  Yes Historical Provider, MD  cyclobenzaprine (FLEXERIL) 10 MG tablet Take 1 tablet (10 mg total) by mouth 2 (two) times daily as needed for muscle spasms. 11/14/15   Alvira Monday, MD  Levomilnacipran HCl ER (FETZIMA) 120 MG CP24 Take 120 mg by mouth daily.    Historical Provider, MD    oxyCODONE-acetaminophen (PERCOCET/ROXICET) 5-325 MG tablet Take 2 tablets by mouth every 4 (four) hours as needed for severe pain. 11/14/15   Alvira Monday, MD    Family History Family History  Problem Relation Age of Onset  . Arthritis Mother   . Heart disease Mother     ?psvt  . Breast cancer Mother 59    TAH/BSO  . Cancer Mother   . Mental illness Mother   . COPD Father   . Hypertension Father   . Alcohol abuse Father   . Mental illness Father   . Heart disease Father   . Breast cancer Maternal Aunt 27    deceased  . Cancer Cousin 47    female; unknown primary  . Colon  cancer Paternal Aunt 73    deceased at 60  . Stomach cancer Paternal Uncle 30    deceased at 54  . Alcohol abuse Brother   . Cancer Brother   . Hodgkin's lymphoma Brother   . Mental illness Brother     Social History Social History  Substance Use Topics  . Smoking status: Former Smoker    Packs/day: 0.50    Years: 10.00    Types: Cigarettes  . Smokeless tobacco: Never Used     Comment: smoked for 10 years  . Alcohol use No     Comment: denied alcohol     Allergies   Aspirin; Ibuprofen; Ketamine; Ativan [lorazepam]; and Tramadol hcl   Review of Systems Review of Systems  Constitutional: Negative for fever.  HENT: Negative for sore throat.   Eyes: Negative for visual disturbance.  Respiratory: Negative for cough and shortness of breath.   Cardiovascular: Negative for chest pain.  Gastrointestinal: Negative for abdominal pain.  Genitourinary: Negative for difficulty urinating.  Musculoskeletal: Positive for arthralgias and back pain. Negative for neck pain.  Skin: Negative for rash.  Neurological: Positive for weakness. Negative for syncope, numbness and headaches.     Physical Exam Updated Vital Signs BP (!) 92/53   Pulse 65   Temp 97.9 F (36.6 C) (Oral)   Resp 16   SpO2 95%   Physical Exam  Constitutional: She is oriented to person, place, and time. She appears  well-developed and well-nourished. No distress.  HENT:  Head: Normocephalic and atraumatic.  Eyes: Conjunctivae and EOM are normal.  Neck: Normal range of motion.  Cardiovascular: Normal rate, regular rhythm and intact distal pulses.   Pulmonary/Chest: Effort normal. No respiratory distress.  Abdominal: Soft. She exhibits no distension. There is no tenderness. There is no guarding.  Musculoskeletal: She exhibits no edema or tenderness.  Neurological: She is alert and oriented to person, place, and time. No sensory deficit. GCS eye subscore is 4. GCS verbal subscore is 5. GCS motor subscore is 6.  Reflex Scores:      Patellar reflexes are 0 on the right side and 3+ on the left side. Normal strength hip extension, knee/ankle flexion/extension Bilateral mild weakness hip flexion although limited by pain and effort   Skin: Skin is warm and dry. No rash noted. She is not diaphoretic. No erythema.  Nursing note and vitals reviewed.    ED Treatments / Results  Labs (all labs ordered are listed, but only abnormal results are displayed) Labs Reviewed - No data to display  EKG  EKG Interpretation None       Radiology Mr Lumbar Spine Wo Contrast  Result Date: 11/14/2015 CLINICAL DATA:  History of chronic back pain, with sciatica. RIGHT hip pain. Previous lumbar surgery. EXAM: MRI LUMBAR SPINE WITHOUT CONTRAST TECHNIQUE: Multiplanar, multisequence MR imaging of the lumbar spine was performed. No intravenous contrast was administered. COMPARISON:  12/10/2012 MR. FINDINGS: Segmentation: Transitional anatomy, with the last fusion segment termed L5-S1. Numbering scheme similar to priors. Alignment:  Stable 3 mm anterolisthesis L5-S1. Vertebrae: Status post L4-S1 interbody and posterior fusion. Susceptibility artifact from hardware. Hemangioma L1. Conus medullaris: Extends to the L1  Level and appears normal. Paraspinal and other soft tissues: Unremarkable Disc levels: L1-L2:  Mild disc space  narrowing.  Annular bulge.  No impingement. L2-L3:  Annular bulge.  Facet arthropathy.  No impingement. L3-L4: Central and rightward extrusion. Caudally migrated free fragment on the RIGHT. Posterior element hypertrophy contributes to moderate to severe spinal  stenosis, representing adjacent segment disease. RIGHT greater than LEFT L4 and L3 nerve root impingement. L4-L5: Posterior and interbody arthrodesis. No residual impingement. L5-S1: 3 mm anterolisthesis. Posterior and interbody arthrodesis. No residual impingement. Compared with most recent prior MR, the L3-4 level was unremarkable. Pedicle screws have been inserted since that time. IMPRESSION: Status post L4-S1 posterior and interbody arthrodesis. No residual impingement at those levels. 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 RIGHT greater than LEFT L4 and L3 nerve root impingement. Electronically Signed   By: Elsie StainJohn T Curnes M.D.   On: 11/14/2015 10:53    Procedures Procedures (including critical care time)  Medications Ordered in ED Medications  HYDROmorphone (DILAUDID) injection 1 mg (1 mg Intramuscular Given 11/14/15 0917)  cyclobenzaprine (FLEXERIL) tablet 10 mg (10 mg Oral Given 11/14/15 0916)  HYDROmorphone (DILAUDID) injection 1 mg (1 mg Intramuscular Given 11/14/15 1320)  dexamethasone (DECADRON) tablet 10 mg (10 mg Oral Given 11/14/15 1422)     Initial Impression / Assessment and Plan / ED Course  I have reviewed the triage vital signs and the nursing notes.  Pertinent labs & imaging results that were available during my care of the patient were reviewed by me and considered in my medical decision making (see chart for details).  Clinical Course   60 year old female with a history of fibromyalgia, rheumatoid arthritis, chronic pain, anterior cervical discectomy in 2016, lumbar fusion, PTSD, depression presents with concern for worsening bilateral leg  pain and weakness. Patient had been seen by her orthopedic provider as well as the emergency department, however reports that last night her pain significantly worsened, in addition, she's had difficulty ambulating, stating that her bottom little "drop" when she stands up, and has sensation of worsening weakness in her legs.  On exam, patient has full strength with distal dorsiflexion, plantar flexion, flexion and extension of the knee, however has possible weakness with hip flexion bilaterally with exam which may be limited by effort and pain.    Given possible weakness and change in status, ordered MR of the lumbar spine. Gave patient Dilaudid and Flexeril for pain.  MR shows adjacent segment disease with rightward extrusion, migrated free fragment on the right with l4 and L3 nerve root impingement, with lesion appropriate as etiology of pain which is patient's primary concern.  Patient with improved strength with pain medications. Used wheelchair to bathroom but able to ambulate per nursing into restroom.  Discussed with [facility] on call and feel patient is appropriate for close follow up with Dr. Ophelia CharterYates tomorrow. Given rx for oxycodone for tonight after discussion with [facility].  Patient discharged in stable condition with understanding of reasons to return.   Final Clinical Impressions(s) / ED Diagnoses   Final diagnoses:  Bilateral leg pain  Bilateral low back pain with sciatica, sciatica laterality unspecified    New Prescriptions Discharge Medication List as of 11/14/2015  1:11 PM       Alvira MondayErin Tyresse Jayson, MD 11/14/15 2206

## 2015-11-14 NOTE — ED Notes (Signed)
Pt requesting iv meds for pain.

## 2015-11-21 ENCOUNTER — Other Ambulatory Visit: Payer: Self-pay | Admitting: Family Medicine

## 2015-11-22 NOTE — Telephone Encounter (Signed)
Rx refill sent to pharmacy. 

## 2015-11-26 ENCOUNTER — Other Ambulatory Visit: Payer: Self-pay | Admitting: Family Medicine

## 2015-11-29 ENCOUNTER — Encounter (HOSPITAL_COMMUNITY): Payer: Self-pay | Admitting: *Deleted

## 2015-11-29 ENCOUNTER — Emergency Department (HOSPITAL_COMMUNITY)
Admission: EM | Admit: 2015-11-29 | Discharge: 2015-11-29 | Disposition: A | Discharge status: Home / Self Care | Payer: Managed Care, Other (non HMO) | Attending: Emergency Medicine | Admitting: Emergency Medicine

## 2015-11-29 DIAGNOSIS — G8929 Other chronic pain: Secondary | ICD-10-CM | POA: Insufficient documentation

## 2015-11-29 DIAGNOSIS — F909 Attention-deficit hyperactivity disorder, unspecified type: Secondary | ICD-10-CM | POA: Insufficient documentation

## 2015-11-29 DIAGNOSIS — Z96611 Presence of right artificial shoulder joint: Secondary | ICD-10-CM | POA: Diagnosis not present

## 2015-11-29 DIAGNOSIS — J45909 Unspecified asthma, uncomplicated: Secondary | ICD-10-CM | POA: Diagnosis not present

## 2015-11-29 DIAGNOSIS — M545 Low back pain, unspecified: Secondary | ICD-10-CM

## 2015-11-29 DIAGNOSIS — N189 Chronic kidney disease, unspecified: Secondary | ICD-10-CM | POA: Insufficient documentation

## 2015-11-29 DIAGNOSIS — Z87891 Personal history of nicotine dependence: Secondary | ICD-10-CM | POA: Diagnosis not present

## 2015-11-29 MED ORDER — PREDNISONE 20 MG PO TABS: 20.0000 mg | ORAL_TABLET | Freq: Every day | ORAL | 0 refills | Status: DC

## 2015-11-29 MED ORDER — HYDROMORPHONE HCL 1 MG/ML IJ SOLN
1.0000 mg | Freq: Once | INTRAMUSCULAR | Status: AC
  Administered 2015-11-29: 1 mg via INTRAVENOUS
  Filled 2015-11-29: qty 1

## 2015-11-29 MED ORDER — DEXAMETHASONE SODIUM PHOSPHATE 10 MG/ML IJ SOLN
10.0000 mg | Freq: Once | INTRAMUSCULAR | Status: AC
  Administered 2015-11-29: 10 mg via INTRAVENOUS
  Filled 2015-11-29: qty 1

## 2015-11-29 NOTE — Discharge Instructions (Signed)
Call Dr. Ophelia Charter to schedule a follow up appointment as soon as possible. In the meantime take the prednisone (steroids) as prescribed starting tomorrow. You may take at the same time as the oxycodone you have at home. Return to the ER for new or worsening symptoms.

## 2015-11-29 NOTE — ED Provider Notes (Signed)
[facility] Provider Note   CSN: [id number] Arrival date & time: 11/29/15  0753     History   Chief Complaint Chief Complaint  Patient presents with  . Back Pain   HPI   Kara [name] is an 60 y.o. female with history of chronic back pain, fibromyalgia, CKD, depression, osteoporosis, rheumatoid arthritis, s/p lumbar fusion and anterior cervical discectomy who presents to the ED for evaluation of low back pain. She states that about two months ago she fell and has since then had low back pain. About two weeks ago the pain significantly worsened and she was evaluated in the ED on 11/14/15, found to have adjacent segment disease with rightward extrusion and migrated free fragement on the right with L3 and L4 nerve root impingement found on MRI. She has seen Dr. Ophelia Charter for follow up and surgery scheduled for 9/15. However, she states her oxycodone 10mg  at home has not provided adequate pain relief over this weekend. She rates her pain 10/10. No position is comfortable. She has also been taking flexeril intermittently. Denies new fever, chills, weakness, numbness, tingling. Denies loss of control of bowel or bladder. She states she called Dr. 12/10' office today (closed for Labor Day) and he advised coming to the ER to help with pain control and possible rx for anti-inflammatory medication. She still has oxycodone at home.   Past Medical History:  Diagnosis Date  . ADD (attention deficit disorder)    on Adderal  . Aggressive behavior of adult   . Allergy   . Anxiety   . Arthritis   . Asthma    as a child  . Cellulitis of breast 11/2013   RIGHT BREAST  . Chronic fatigue and immune dysfunction syndrome   . Chronic kidney disease   . Chronic pain    goes to [facility] for [facility] control  . Cold sore   . 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 Depression    takes meds daily  . Family history of malignant neoplasm of breast   .  Fibromyalgia   . Nephrolithiasis   . Osteoporosis   . Other specified rheumatoid arthritis, right shoulder (HCC) 08/01/2011  . Post-nasal drip    hx of  . Rheumatoid arthritis(714.0)    autoimmune arthritis; methotrexate once/week  . Urinary incontinence     Patient Active Problem List   Diagnosis Date Noted  . Prediabetes 08/19/2015  . BMI 26.0-26.9,adult 08/18/2015  . Chronic pain 08/18/2015  . Vitamin D deficiency 08/18/2015  . Atypical chest pain 08/18/2015  . Elevated BP 08/18/2015  . Chronic fatigue disorder 06/14/2015  . Nail fungus 06/14/2015  . S/P cervical spinal fusion 03/15/2015  . S/P lumbar fusion 07/03/2014  . PTSD (post-traumatic stress disorder) 03/07/2014  . Severe recurrent major depression without psychotic features (HCC) 03/06/2014  . Suicide threat or attempt 03/06/2014  . Agitation 03/06/2014  . Aggressive behavior   . Family history of malignant neoplasm of breast   . Rheumatoid arthritis (HCC) 12/13/2011  . Fibromyalgia 12/13/2011    Past Surgical History:  Procedure Laterality Date  . ANTERIOR CERVICAL DECOMP/DISCECTOMY FUSION N/A 03/15/2015   Procedure: Cervical five-six, Cerival six-seven, Anterior Cervical Discectomy and Fusion, Allograft and Plate;  Surgeon: 03/17/2015, MD;  Location: [facility];  Service: Orthopedics;  Laterality: N/A;  . BACK SURGERY  2011   lower back fusion l4-5, s1  . BLADDER SURGERY  2009  . BREAST  ENHANCEMENT SURGERY  2003  . breast implants removed    . BREAST SURGERY  10/2013   REMOVAL OF BREAST IMPLANTS  . INCISION AND DRAINAGE ABSCESS Right 01/16/2014   Procedure: INCISION AND DRAINAGE AND OF RIGHT BREAST ABCESS;  Surgeon: Glenna Fellows, MD;  Location: [facility];  Service: General;  Laterality: Right;  . LIPOSUCTION  2003   abdominal  . MASS EXCISION  11/03/2011   Procedure: MINOR EXCISION OF MASS;  Surgeon: Wyn Forster., MD;  Location: [facility];  Service: Orthopedics;  Laterality: Left;   debride IP joint, cyst excision left index  . SHOULDER SURGERY  2012   right  . TONSILLECTOMY  1987  . TOTAL SHOULDER ARTHROPLASTY  08/01/2011   Procedure: TOTAL SHOULDER ARTHROPLASTY;  Surgeon: Eulas Post, MD;  Location: [facility];  Service: Orthopedics;  Laterality: Right;  Right total shoulder arthroplasty  . TUBAL LIGATION  1988    OB History    No data available       Home Medications    Prior to Admission medications   Medication Sig Start Date End Date Taking? Authorizing Provider  ADDERALL XR 30 MG 24 hr capsule Take 1 capsule by mouth 2 (two) times daily. Pt takes at 0800 and at [facility] 11/02/15   Historical Provider, MD  ALPRAZolam Prudy Feeler) 1 MG tablet Take 1 mg by mouth 2 (two) times daily as needed for anxiety.  07/06/14   Historical Provider, MD  amphetamine-dextroamphetamine (ADDERALL) 30 MG tablet Take 30 mg by mouth See admin instructions. Pt takes at 1600 as needed 06/05/14   Historical Provider, MD  cyclobenzaprine (FLEXERIL) 10 MG tablet Take 1 tablet (10 mg total) by mouth 2 (two) times daily as needed for muscle spasms. 11/14/15   Alvira Monday, MD  diclofenac sodium (VOLTAREN) 1 % GEL Apply 1 application topically every 4 (four) hours as needed (arthritis pain). Must be name brand 01/13/15   Historical Provider, MD  Levomilnacipran HCl ER (FETZIMA) 120 MG CP24 Take 120 mg by mouth daily.    Historical Provider, MD  Magnesium 500 MG TABS Take 500 mg by mouth daily.     Historical Provider, MD  meloxicam (MOBIC) 15 MG tablet TAKE 1 TABLET BY MOUTH EVERY DAY 11/22/15   [name], MD  oxyCODONE (ROXICODONE) 5 MG immediate release tablet Take 1 tablet (5 mg total) by mouth every 6 (six) hours as needed for severe pain. 11/11/15   Audry Pili, PA-C  oxyCODONE-acetaminophen (PERCOCET/ROXICET) 5-325 MG tablet Take 2 tablets by mouth every 4 (four) hours as needed for severe pain. 11/14/15   Alvira Monday, MD  tretinoin (RETIN-A) 0.1 % cream Apply 1 application topically at  bedtime. 90 Day Supply 08/18/15   Renee A Kuneff, DO  venlafaxine XR (EFFEXOR-XR) 75 MG 24 hr capsule Take 75 mg by mouth daily with breakfast.     Historical Provider, MD  zolpidem (AMBIEN) 10 MG tablet TAKE 1 TABLET BY MOUTH AT BEDTIME AS NEEDED 05/23/15   Historical Provider, MD    Family History Family History  Problem Relation Age of Onset  . Arthritis Mother   . Heart disease Mother     ?psvt  . Breast cancer Mother 21    TAH/BSO  . Cancer Mother   . Mental illness Mother   . COPD Father   . Hypertension Father   . Alcohol abuse Father   . Mental illness Father   . Heart disease Father   . Breast  cancer Maternal Aunt 27    deceased  . Cancer Cousin 34    female; unknown primary  . Colon cancer Paternal Aunt 49    deceased at 38  . Stomach cancer Paternal Uncle 74    deceased at 3  . Alcohol abuse Brother   . Cancer Brother   . Hodgkin's lymphoma Brother   . Mental illness Brother     Social History Social History  Substance Use Topics  . Smoking status: Former Smoker    Packs/day: 0.50    Years: 10.00    Types: Cigarettes  . Smokeless tobacco: Never Used     Comment: smoked for 10 years  . Alcohol use No     Comment: denied alcohol     Allergies   Aspirin; Ibuprofen; Ketamine; Ativan [lorazepam]; and Tramadol hcl   Review of Systems Review of Systems 10 Systems reviewed and are negative for acute change except as noted in the HPI.  Physical Exam Updated Vital Signs BP 160/86 (BP Location: Left Arm)   Pulse 88   Temp 98.7 F (37.1 C) (Oral)   Resp 18   Ht 5\' 4"  (1.626 m)   Wt 70.8 kg   SpO2 100%   BMI 26.78 kg/m   Physical Exam  Constitutional: She is oriented to person, place, and time. No distress.  Appears uncomfortable  HENT:  Head: Atraumatic.  Right Ear: External ear normal.  Left Ear: External ear normal.  Nose: Nose normal.  Eyes: Conjunctivae are normal. No scleral icterus.  Cardiovascular: Normal rate and regular rhythm.     Pulmonary/Chest: Effort normal. No respiratory distress.  Abdominal: She exhibits no distension.  Musculoskeletal:  +midline lumbar spinal tenderness Decreased strength and ROM in bilateral hips though limited as pt is in pain  Neurological: She is alert and oriented to person, place, and time.  Skin: Skin is warm and dry. She is not diaphoretic.  Psychiatric: She has a normal mood and affect. Her behavior is normal.  Nursing note and vitals reviewed.    ED Treatments / Results  Labs (all labs ordered are listed, but only abnormal results are displayed) Labs Reviewed - No data to display  EKG  EKG Interpretation None       Radiology No results found.  Procedures Procedures (including critical care time)  Medications Ordered in ED Medications  HYDROmorphone (DILAUDID) injection 1 mg (not administered)  dexamethasone (DECADRON) injection 10 mg (not administered)     Initial Impression / Assessment and Plan / ED Course  I have reviewed the triage vital signs and the nursing notes.  Pertinent labs & imaging results that were available during my care of the patient were reviewed by me and considered in my medical decision making (see chart for details).  Clinical Course   8:17 AM Pt is an 60 y.o. female with known spinal pathology on MRI 11/14/15 with surgery scheduled with Dr. 11/16/15 on 9/15. She has oxycodone-acetaminophen 10-325 at home which is not providing adequate pain relief. Reportedly sent to the ED by Dr. 10/15 for assistance in pain control and perhaps starting on anti-inflamamtory. Pt states she is anaphylactic to all NSAIDs. Will give decadron in the ED and send home with prednisone burst. Dilaudid ordered in the  ED as well. Pt requesting IV as it "works better" than IM. Instructed to call Dr. Ophelia Charter' office to schedule follow up as soon as possible this week.   Final Clinical Impressions(s) / ED Diagnoses   Final diagnoses:  Midline low back pain without  sciatica    New Prescriptions Discharge Medication List as of 11/29/2015  8:59 AM    START taking these medications   Details  predniSONE (DELTASONE) 20 MG tablet Take 1 tablet (20 mg total) by mouth daily., Starting Mon 11/29/2015, Print         Carlene Coria, PA-C 11/29/15 [id number]    Lorre Nick, MD 11/29/15 [phone]

## 2015-11-29 NOTE — ED Triage Notes (Signed)
Pt reports her back pain is worse this am . Pt is waiting for back surgery this month. Pain 10/10

## 2015-11-30 NOTE — ED Notes (Signed)
Pt came to pick up discharge instructions and prescription.

## 2015-12-01 ENCOUNTER — Encounter: Payer: Self-pay | Admitting: Cardiovascular Disease

## 2015-12-01 ENCOUNTER — Ambulatory Visit (INDEPENDENT_AMBULATORY_CARE_PROVIDER_SITE_OTHER): Payer: Managed Care, Other (non HMO) | Admitting: Cardiovascular Disease

## 2015-12-01 VITALS — BP 150/100 | HR 83 | Ht 64.0 in | Wt 160.6 lb

## 2015-12-01 DIAGNOSIS — R0789 Other chest pain: Secondary | ICD-10-CM | POA: Diagnosis not present

## 2015-12-01 DIAGNOSIS — R079 Chest pain, unspecified: Secondary | ICD-10-CM

## 2015-12-01 NOTE — Telephone Encounter (Signed)
Rx refill sent to pharmacy. 

## 2015-12-01 NOTE — Progress Notes (Signed)
12/01/2015 [name]   19-Jan-1956  [id number]  Primary Physician Felix Pacini, DO Primary Cardiologist: Runell Gess MD Roseanne Reno  HPI:  Mrs. Wisnewski is a 60 year old mildly overweight Caucasian female mother of 4 children who is disabled because of multiple orthopedic issues. She has no cardiac risk factors other than family history. Her father did have a myocardial infarction and bypass surgery. She has never had a heart attack or stroke. Her other problems include history of rheumatoid arthritis, chronic fatigue syndrome and fibromyalgia. She has cervical disc fusion by Dr. Ophelia Charter December 2016. There is also question of mitral valve prolapse in the past. She does complain of chest pain with bilateral neck radiation with activity.   Current Outpatient Prescriptions  Medication Sig Dispense Refill  . ADDERALL XR 30 MG 24 hr capsule Take 1 capsule by mouth 2 (two) times daily. Pt takes at 0800 and at [facility]    . ALPRAZolam (XANAX) 1 MG tablet Take 1 mg by mouth 2 (two) times daily as needed for anxiety.     [name] amphetamine-dextroamphetamine (ADDERALL) 30 MG tablet Take 30 mg by mouth See admin instructions. Pt takes at 1600 as needed  0  . cyclobenzaprine (FLEXERIL) 10 MG tablet Take 1 tablet (10 mg total) by mouth 2 (two) times daily as needed for muscle spasms. 20 tablet 0  . diclofenac sodium (VOLTAREN) 1 % GEL Apply 1 application topically every 4 (four) hours as needed (arthritis pain). Must be name brand  3  . meloxicam (MOBIC) 15 MG tablet TAKE 1 TABLET BY MOUTH EVERY DAY 30 tablet 0  . Oxycodone HCl 10 MG TABS Take 10 mg by mouth every 6 (six) hours as needed (pain).    . predniSONE (DELTASONE) 20 MG tablet Take 1 tablet (20 mg total) by mouth daily. 6 tablet 0  . tretinoin (RETIN-A) 0.1 % cream Apply 1 application topically at bedtime. 90 Day Supply 45 g 1  . venlafaxine XR (EFFEXOR-XR) 75 MG 24 hr capsule Take 75 mg by mouth daily with breakfast.     . zolpidem  (AMBIEN) 10 MG tablet TAKE 1 TABLET BY MOUTH AT BEDTIME AS NEEDED  3   No current facility-administered medications for this visit.     Allergies  Allergen Reactions  . Aspirin Anaphylaxis  . Ibuprofen Anaphylaxis  . Ketamine Other (See Comments)    Hallucinations  . Ativan [Lorazepam] Other (See Comments)    Makes agitated, combative   . Tramadol Hcl Itching, Swelling and Rash    Social History   Social History  . Marital status: Married    Spouse name: N/A  . Number of children: N/A  . Years of education: N/A   Occupational History  . Not on file.   Social History Main Topics  . Smoking status: Former Smoker    Packs/day: 0.50    Years: 10.00    Types: Cigarettes  . Smokeless tobacco: Never Used     Comment: smoked for 10 years  . Alcohol use No     Comment: denied alcohol  . Drug use: No     Comment: denied any drug use with admission nurse  . Sexual activity: Yes    Birth control/ protection: Post-menopausal   Other Topics Concern  . Not on file   Social History Narrative   Married, Kauri Garson.    6 children.    [employer], Retired Child psychotherapist.    Denies alcohol, tobacco or drug use.  Drinks caffeinated beverages. Uses herbal remedies. Take a daily vitamin.   Wears her seatbelt. Exercises greater than 3 times a week.   Smoke detector in the home, firearms in the home in a locked cabinet, feels safe in her relationships.     Review of Systems: General: negative for chills, fever, night sweats or weight changes.  Cardiovascular: negative for chest pain, dyspnea on exertion, edema, orthopnea, palpitations, paroxysmal nocturnal dyspnea or shortness of breath Dermatological: negative for rash Respiratory: negative for cough or wheezing Urologic: negative for hematuria Abdominal: negative for nausea, vomiting, diarrhea, bright red blood per rectum, melena, or hematemesis Neurologic: negative for visual changes, syncope, or dizziness All other systems reviewed  and are otherwise negative except as noted above.    Blood pressure (!) 150/100, pulse 83, height 5\' 4"  (1.626 m), weight 160 lb 9.6 oz (72.8 kg).  General appearance: alert and no distress Neck: no adenopathy, no carotid bruit, no JVD, supple, symmetrical, trachea midline and thyroid not enlarged, symmetric, no tenderness/mass/nodules Lungs: clear to auscultation bilaterally Heart: regular rate and rhythm, S1, S2 normal, no murmur, click, rub or gallop Extremities: extremities normal, atraumatic, no cyanosis or edema  EKG sinus rhythm at 83 with ST or T-wave changes. Personally reviewed this EKG  ASSESSMENT AND PLAN:   Atypical chest pain Mrs. Etienne was referred for atypical chest pain. She does have fibromyalgia, rheumatoid arthritis, chronic fatigue syndrome and has had cervical disc fusion. She gets epidural chest pain with radiation to her neck  principally with exertion. Her only risk factors are family history. Apparently an exercise Myoview on her as well as a 2-D echo to rule out mitral valve prolapse.      MD FACP,FACC,FAHA, [facility] 12/01/2015 12:32 PM

## 2015-12-01 NOTE — Assessment & Plan Note (Signed)
Kara [name] was referred for atypical chest pain. She does have fibromyalgia, rheumatoid arthritis, chronic fatigue syndrome and has had cervical disc fusion. She gets epidural chest pain with radiation to her neck  principally with exertion. Her only risk factors are family history. Apparently an exercise Myoview on her as well as a 2-D echo to rule out mitral valve prolapse.

## 2015-12-01 NOTE — Patient Instructions (Signed)
Medication Instructions:  NO CHANGES.  Testing/Procedures: Your physician has requested that you have an echocardiogram. Echocardiography is a painless test that uses sound waves to create images of your heart. It provides your doctor with information about the size and shape of your heart and how well your heart's chambers and valves are working. This procedure takes approximately one hour. There are no restrictions for this procedure.  Your physician has requested that you have en exercise stress myoview. For further information please visit [email or web address] Please follow instruction sheet, as given.    Follow-Up: Your physician recommends that you schedule a follow-up appointment in: 3-5 WEEKS WITH DR Allyson Sabal AFTER TESTING IS COMPLETED.   Any Other Special Instructions Will Be Listed Below (If Applicable).  Exercise Stress Electrocardiogram An exercise stress electrocardiogram is a test that is done to evaluate the blood supply to your heart. This test may also be called exercise stress electrocardiography. The test is done while you are walking on a treadmill. The goal of this test is to raise your heart rate. This test is done to find areas of poor blood flow to the heart by determining the extent of coronary artery disease (CAD).   CAD is defined as narrowing in one or more heart (coronary) arteries of more than 70%. If you have an abnormal test result, this may mean that you are not getting adequate blood flow to your heart during exercise. Additional testing may be needed to understand why your test was abnormal. LET [facility] CARE PROVIDER KNOW ABOUT:   Any allergies you have.  All medicines you are taking, including vitamins, herbs, eye drops, creams, and over-the-counter medicines.  Previous problems you or members of your family have had with the use of anesthetics.  Any blood disorders you have.  Previous surgeries you have had.  Medical conditions you  have.  Possibility of pregnancy, if this applies. RISKS AND COMPLICATIONS Generally, this is a safe procedure. However, as with any procedure, complications can occur. Possible complications can include:  Pain or pressure in the following areas:  Chest.  Jaw or neck.  Between your shoulder blades.  Radiating down your left arm.  Dizziness or light-headedness.  Shortness of breath.  Increased or irregular heartbeats.  Nausea or vomiting.  Heart attack (rare). BEFORE THE PROCEDURE  Avoid all forms of caffeine 24 hours before your test or as directed by your health care provider. This includes coffee, tea (even decaffeinated tea), caffeinated sodas, chocolate, cocoa, and certain pain medicines.  Follow your health care provider's instructions regarding eating and drinking before the test.  Take your medicines as directed at regular times with water unless instructed otherwise. Exceptions may include:  If you have diabetes, ask how you are to take your insulin or pills. It is common to adjust insulin dosing the morning of the test.  If you are taking beta-blocker medicines, it is important to talk to your health care provider about these medicines well before the date of your test. Taking beta-blocker medicines may interfere with the test. In some cases, these medicines need to be changed or stopped 24 hours or more before the test.  If you wear a nitroglycerin patch, it may need to be removed prior to the test. Ask your health care provider if the patch should be removed before the test.  If you use an inhaler for any breathing condition, bring it with you to the test.  If you are an outpatient, bring a snack so you can  eat right after the stress phase of the test.  Do not smoke for 4 hours prior to the test or as directed by your health care provider.  Do not apply lotions, powders, creams, or oils on your chest prior to the test.  Wear loose-fitting clothes and  comfortable shoes for the test. This test involves walking on a treadmill. PROCEDURE  Multiple patches (electrodes) will be put on your chest. If needed, small areas of your chest may have to be shaved to get better contact with the electrodes. Once the electrodes are attached to your body, multiple wires will be attached to the electrodes and your heart rate will be monitored.  Your heart will be monitored both at rest and while exercising.  You will walk on a treadmill. The treadmill will be started at a slow pace. The treadmill speed and incline will gradually be increased to raise your heart rate. AFTER THE PROCEDURE  Your heart rate and blood pressure will be monitored after the test.  You may return to your normal schedule including diet, activities, and medicines, unless your health care provider tells you otherwise.   This information is not intended to replace advice given to you by your health care provider. Make sure you discuss any questions you have with your health care provider.   Document Released: 03/10/2000 Document Revised: 03/18/2013 Document Reviewed: 11/18/2012 Elsevier Interactive Patient Education [employer]     If you need a refill on your cardiac medications before your next appointment, please call your pharmacy.

## 2015-12-02 ENCOUNTER — Ambulatory Visit (HOSPITAL_COMMUNITY)
Admission: RE | Admit: 2015-12-02 | Discharge: 2015-12-02 | Disposition: A | Discharge status: Home / Self Care | Payer: Managed Care, Other (non HMO) | Source: Ambulatory Visit | Attending: Cardiovascular Disease | Admitting: Cardiovascular Disease

## 2015-12-02 DIAGNOSIS — R079 Chest pain, unspecified: Secondary | ICD-10-CM | POA: Diagnosis present

## 2015-12-02 DIAGNOSIS — Z8249 Family history of ischemic heart disease and other diseases of the circulatory system: Secondary | ICD-10-CM | POA: Insufficient documentation

## 2015-12-02 DIAGNOSIS — R5383 Other fatigue: Secondary | ICD-10-CM | POA: Insufficient documentation

## 2015-12-02 DIAGNOSIS — M797 Fibromyalgia: Secondary | ICD-10-CM | POA: Insufficient documentation

## 2015-12-02 DIAGNOSIS — R0609 Other forms of dyspnea: Secondary | ICD-10-CM | POA: Insufficient documentation

## 2015-12-02 LAB — MYOCARDIAL PERFUSION IMAGING
CHL CUP NUCLEAR SSS: 0
LV sys vol: 18 mL
LVDIAVOL: 58 mL (ref 46–106)
Peak HR: 85 {beats}/min
Rest HR: 67 {beats}/min
SDS: 0
SRS: 0
TID: 1.31

## 2015-12-02 MED ORDER — REGADENOSON 0.4 MG/5ML IV SOLN
0.4000 mg | Freq: Once | INTRAVENOUS | Status: AC
  Administered 2015-12-02: 0.4 mg via INTRAVENOUS

## 2015-12-02 MED ORDER — TECHNETIUM TC 99M TETROFOSMIN IV KIT
10.1000 | PACK | Freq: Once | INTRAVENOUS | Status: AC | PRN
  Administered 2015-12-02: 10.1 via INTRAVENOUS
  Filled 2015-12-02: qty 10

## 2015-12-02 MED ORDER — TECHNETIUM TC 99M TETROFOSMIN IV KIT
29.5000 | PACK | Freq: Once | INTRAVENOUS | Status: AC | PRN
  Administered 2015-12-02: 29.5 via INTRAVENOUS
  Filled 2015-12-02: qty 30

## 2015-12-02 MED ORDER — AMINOPHYLLINE 25 MG/ML IV SOLN
75.0000 mg | Freq: Once | INTRAVENOUS | Status: AC
  Administered 2015-12-02: 75 mg via INTRAVENOUS

## 2015-12-03 ENCOUNTER — Ambulatory Visit (HOSPITAL_COMMUNITY)
Admission: RE | Admit: 2015-12-03 | Discharge: 2015-12-03 | Disposition: A | Discharge status: Home / Self Care | Payer: Managed Care, Other (non HMO) | Source: Ambulatory Visit | Attending: Cardiovascular Disease | Admitting: Cardiovascular Disease

## 2015-12-03 ENCOUNTER — Encounter (HOSPITAL_COMMUNITY): Payer: Self-pay | Admitting: Emergency Medicine

## 2015-12-03 ENCOUNTER — Emergency Department (HOSPITAL_COMMUNITY)
Admission: EM | Admit: 2015-12-03 | Discharge: 2015-12-03 | Disposition: A | Discharge status: Home / Self Care | Payer: Managed Care, Other (non HMO) | Attending: Physician Assistant | Admitting: Physician Assistant

## 2015-12-03 DIAGNOSIS — M549 Dorsalgia, unspecified: Secondary | ICD-10-CM

## 2015-12-03 DIAGNOSIS — M545 Low back pain: Secondary | ICD-10-CM | POA: Diagnosis not present

## 2015-12-03 DIAGNOSIS — N189 Chronic kidney disease, unspecified: Secondary | ICD-10-CM | POA: Diagnosis not present

## 2015-12-03 DIAGNOSIS — R5382 Chronic fatigue, unspecified: Secondary | ICD-10-CM | POA: Diagnosis not present

## 2015-12-03 DIAGNOSIS — R079 Chest pain, unspecified: Secondary | ICD-10-CM

## 2015-12-03 DIAGNOSIS — Z96611 Presence of right artificial shoulder joint: Secondary | ICD-10-CM | POA: Diagnosis not present

## 2015-12-03 DIAGNOSIS — G8918 Other acute postprocedural pain: Secondary | ICD-10-CM | POA: Insufficient documentation

## 2015-12-03 DIAGNOSIS — Z87891 Personal history of nicotine dependence: Secondary | ICD-10-CM | POA: Insufficient documentation

## 2015-12-03 DIAGNOSIS — Z8249 Family history of ischemic heart disease and other diseases of the circulatory system: Secondary | ICD-10-CM | POA: Insufficient documentation

## 2015-12-03 DIAGNOSIS — J45909 Unspecified asthma, uncomplicated: Secondary | ICD-10-CM | POA: Diagnosis not present

## 2015-12-03 MED ORDER — METHOCARBAMOL 500 MG PO TABS
500.0000 mg | ORAL_TABLET | Freq: Once | ORAL | Status: AC
  Administered 2015-12-03: 500 mg via ORAL
  Filled 2015-12-03: qty 1

## 2015-12-03 MED ORDER — DEXAMETHASONE SODIUM PHOSPHATE 10 MG/ML IJ SOLN
10.0000 mg | Freq: Once | INTRAMUSCULAR | Status: AC
  Administered 2015-12-03: 10 mg via INTRAMUSCULAR
  Filled 2015-12-03: qty 1

## 2015-12-03 MED ORDER — METHOCARBAMOL 500 MG PO TABS: 500.0000 mg | ORAL_TABLET | Freq: Two times a day (BID) | ORAL | 0 refills | Status: DC

## 2015-12-03 NOTE — ED Triage Notes (Signed)
Pt st's she is due to have back surg on 9/15  Pt st's the Percocet 10  She is taking is not helping and also st's she is about to run out of meds.  Pt st's she needs something for the pain

## 2015-12-03 NOTE — Discharge Instructions (Signed)
Take the prescribed medication as directed. Follow-up with Dr. Ophelia Charter as soon as possible. Return to the ED for new or worsening symptoms.

## 2015-12-03 NOTE — Progress Notes (Signed)
  Echocardiogram 2D Echocardiogram has been performed.  Leta Jungling M 12/03/2015, 10:44 AM

## 2015-12-03 NOTE — ED Notes (Signed)
Upon attempting to discharge patient, she began screaming at this RN and her husband who entered the room during discharge.  She ripped the Rx off of the d/c papers, crumpled it into a ball, and threw it.  She yelled several expletives and refused discharge vital signs.  Patient ambulatory from treatment room with husband, steady gait, in NAD.

## 2015-12-03 NOTE — ED Provider Notes (Signed)
[facility]-EMERGENCY DEPT Provider Note   CSN: [id number] Arrival date & time: 12/03/15  6213  By signing my name below, I, Aggie Moats, attest that this documentation has been prepared under the direction and in the presence of Sharilyn Sites, PA-C. Electronically signed by: Aggie Moats, ED Scribe. 12/03/15. 5:10 PM.  History   Chief Complaint Chief Complaint  Patient presents with  . Back Pain   The history is provided by the patient and medical records. No language interpreter was used.   HPI Comments:  Kara [name] is a 60 y.o. female with a PMHx of chronic back pain, fibromyalgia, osteoporosis and rheumatoid arthritis who presents to the Emergency Department complaining of chronic back pain, which started several weeks ago. Pt was seen 4 days ago in the ED for same symptoms and is scheduled to have surgery 9/15 with Dr. Ophelia Charter. 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 Associated symptoms include radiation of pain into right leg and difficulty walking and weight bearing secondary to pain; causes knees to "buckle." She has been taking Percocet 10 mg at home, which has not provided adequate relief. Previous cortisone injections have relieved pain for 2-3 days. Denies loss of control of bowel movements and bladder.  No fever, chills, sweats.  Past Medical History:  Diagnosis Date  . ADD (attention deficit disorder)    on Adderal  . Aggressive behavior of adult   . Allergy   . Anxiety   . Arthritis   . Asthma    as a child  . Cellulitis of breast 11/2013   RIGHT BREAST  . Chronic fatigue and immune dysfunction syndrome   . Chronic kidney disease   . Chronic pain    goes to [facility] for pain control  . Cold sore   . 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 Depression    takes meds daily  . Family history of malignant neoplasm of  breast   . Fibromyalgia   . Nephrolithiasis   . Osteoporosis   . Other specified rheumatoid arthritis, right shoulder (HCC) 08/01/2011  . Post-nasal drip    hx of  . Rheumatoid arthritis(714.0)    autoimmune arthritis; methotrexate once/week  . Urinary incontinence     Patient Active Problem List   Diagnosis Date Noted  . Prediabetes 08/19/2015  . BMI 26.0-26.9,adult 08/18/2015  . Chronic pain 08/18/2015  . Vitamin D deficiency 08/18/2015  . Atypical chest pain 08/18/2015  . Elevated BP 08/18/2015  . Chronic fatigue disorder 06/14/2015  . Nail fungus 06/14/2015  . S/P cervical spinal fusion 03/15/2015  . S/P lumbar fusion 07/03/2014  . PTSD (post-traumatic stress disorder) 03/07/2014  . Severe recurrent major depression without psychotic features (HCC) 03/06/2014  . Suicide threat or attempt 03/06/2014  . Agitation 03/06/2014  . Aggressive behavior   . Family history of malignant neoplasm of breast   . Rheumatoid arthritis (HCC) 12/13/2011  . Fibromyalgia 12/13/2011    Past Surgical History:  Procedure Laterality Date  . ANTERIOR CERVICAL DECOMP/DISCECTOMY FUSION N/A 03/15/2015   Procedure: Cervical five-six, Cerival six-seven, Anterior Cervical Discectomy and Fusion, Allograft and Plate;  Surgeon: Eldred Manges, MD;  Location: [facility] OR;  Service: Orthopedics;  Laterality: N/A;  . BACK SURGERY  2011   lower back fusion l4-5, s1  . BLADDER SURGERY  2009  . BREAST ENHANCEMENT SURGERY  2003  . breast implants removed    . BREAST SURGERY  10/2013   REMOVAL OF BREAST IMPLANTS  . INCISION AND DRAINAGE ABSCESS Right 01/16/2014   Procedure: INCISION AND DRAINAGE AND OF RIGHT BREAST ABCESS;  Surgeon: Glenna Fellows, MD;  Location: [facility] ORS;  Service: General;  Laterality: Right;  . LIPOSUCTION  2003   abdominal  . MASS EXCISION  11/03/2011   Procedure: MINOR EXCISION OF MASS;  Surgeon: Wyn Forster., MD;  Location: [facility];  Service: Orthopedics;  Laterality:  Left;  debride IP joint, cyst excision left index  . SHOULDER SURGERY  2012   right  . TONSILLECTOMY  1987  . TOTAL SHOULDER ARTHROPLASTY  08/01/2011   Procedure: TOTAL SHOULDER ARTHROPLASTY;  Surgeon: Eulas Post, MD;  Location: [facility] OR;  Service: Orthopedics;  Laterality: Right;  Right total shoulder arthroplasty  . TUBAL LIGATION  1988    OB History    No data available       Home Medications    Prior to Admission medications   Medication Sig Start Date End Date Taking? Authorizing Provider  ADDERALL XR 30 MG 24 hr capsule Take 1 capsule by mouth 2 (two) times daily. Pt takes at 0800 and at [facility] 11/02/15   Historical Provider, MD  ALPRAZolam Prudy Feeler) 1 MG tablet Take 1 mg by mouth 2 (two) times daily as needed for anxiety.  07/06/14   Historical Provider, MD  amphetamine-dextroamphetamine (ADDERALL) 30 MG tablet Take 30 mg by mouth See admin instructions. Pt takes at 1600 as needed 06/05/14   Historical Provider, MD  cyclobenzaprine (FLEXERIL) 10 MG tablet Take 1 tablet (10 mg total) by mouth 2 (two) times daily as needed for muscle spasms. 11/14/15   Alvira Monday, MD  diclofenac sodium (VOLTAREN) 1 % GEL Apply 1 application topically every 4 (four) hours as needed (arthritis pain). Must be name brand 01/13/15   Historical Provider, MD  meloxicam (MOBIC) 15 MG tablet TAKE 1 TABLET BY MOUTH EVERY DAY 12/01/15   [name], MD  Oxycodone HCl 10 MG TABS Take 10 mg by mouth every 6 (six) hours as needed (pain).    Historical Provider, MD  predniSONE (DELTASONE) 20 MG tablet Take 1 tablet (20 mg total) by mouth daily. 11/29/15   Ace Gins Sam, PA-C  tretinoin (RETIN-A) 0.1 % cream Apply 1 application topically at bedtime. 90 Day Supply 08/18/15   Renee A Kuneff, DO  venlafaxine XR (EFFEXOR-XR) 75 MG 24 hr capsule Take 75 mg by mouth daily with breakfast.     Historical Provider, MD  zolpidem (AMBIEN) 10 MG tablet TAKE 1 TABLET BY MOUTH AT BEDTIME AS NEEDED 05/23/15   Historical Provider, MD      Family History Family History  Problem Relation Age of Onset  . Arthritis Mother   . Heart disease Mother     ?psvt  . Breast cancer Mother 66    TAH/BSO  . Cancer Mother   . Mental illness Mother   . COPD Father   . Hypertension Father   . Alcohol abuse Father   . Mental illness Father   . Heart disease Father   . Healthy Daughter   . Breast cancer Maternal Aunt 27    deceased  . Cancer Cousin 30    female; unknown primary  . Colon cancer Paternal Aunt 65    deceased at 66  . Stomach cancer Paternal Uncle 80    deceased at 24  .  Alcohol abuse Brother   . Cancer Brother   . Hodgkin's lymphoma Brother   . Mental illness Brother   . HIV Brother   . Healthy Brother   . Healthy Son     Social History Social History  Substance Use Topics  . Smoking status: Former Smoker    Packs/day: 0.50    Years: 10.00    Types: Cigarettes  . Smokeless tobacco: Never Used     Comment: smoked for 10 years  . Alcohol use No     Comment: denied alcohol     Allergies   Aspirin; Ibuprofen; Ketamine; Ativan [lorazepam]; and Tramadol hcl   Review of Systems Review of Systems  Gastrointestinal: Negative for diarrhea.  Genitourinary: Negative for dysuria.  Musculoskeletal: Positive for back pain.  All other systems reviewed and are negative.    Physical Exam Updated Vital Signs BP (!) 154/106 (BP Location: Left Arm)   Pulse 95   Temp 97.7 F (36.5 C) (Oral)   Resp 16   Ht 5\' 4"  (1.626 m)   Wt 160 lb (72.6 kg)   SpO2 100%   BMI 27.46 kg/m   Physical Exam  Constitutional: She is oriented to person, place, and time. She appears well-developed and well-nourished.  HENT:  Head: Normocephalic and atraumatic.  Mouth/Throat: Oropharynx is clear and moist.  Eyes: Conjunctivae and EOM are normal. Pupils are equal, round, and reactive to light.  Neck: Normal range of motion.  Cardiovascular: Normal rate, regular rhythm and normal heart sounds.   Pulmonary/Chest: Effort  normal and breath sounds normal.  Abdominal: Soft. Bowel sounds are normal.  Musculoskeletal: Normal range of motion.  Well healed midline lumbar surgical incision noted; tenderness along L3-S1 of lumbar spine without deformity; no signs of trauma; normal strength and sensation of both legs; normal gait unassisted  Neurological: She is alert and oriented to person, place, and time.  Skin: Skin is warm and dry.  Psychiatric: She has a normal mood and affect.  Nursing note and vitals reviewed.    ED Treatments / Results  DIAGNOSTIC STUDIES:  Oxygen Saturation is 100% on room air, normal by my interpretation.    COORDINATION OF CARE:  5:03 PM Discussed treatment plan with pt at bedside, which includes Robaxin and Decadron, and pt agreed to plan.  Labs (all labs ordered are listed, but only abnormal results are displayed) Labs Reviewed - No data to display  EKG  EKG Interpretation None       Radiology No results found.  Procedures Procedures (including critical care time)  Medications Ordered in ED Medications - No data to display   Initial Impression / Assessment and Plan / ED Course  I have reviewed the triage vital signs and the nursing notes.  Pertinent labs & imaging results that were available during my care of the patient were reviewed by me and considered in my medical decision making (see chart for details).  Clinical Course   60 y.o. F here with back pain.  Hx of same, due for surgery next week with Dr. 46.  No real change in her pain when compared with prior notes, rather remains uncontrolled.  States she has 6 of her pain pills left but knows she will run out.  No neurologic deficits here.  She is ambulatory without difficulty.  States she got some relief from decadron previously, will given another injection of that as well as robaxin.  Plan to d/c home with supportive care and follow-up with her surgeon.  6:04 PM At time of discharge patient very upset  and hostile that she is not receiving pain medication.  She is screaming "this is not human" at [employer].  Patient has received multiple narcotic prescriptions in the past month from various providers and this is her 4th ED visit within the past month for similar complaints.  I feel that further narcotics need to come from her surgeon.  She is scheduled for surgery in 1 week.  She was provided with a prescriptions for muscle relaxants which she may take with her remaining percocets.  Final Clinical Impressions(s) / ED Diagnoses   Final diagnoses:  Back pain, unspecified location    New Prescriptions Discharge Medication List as of 12/03/2015  5:54 PM    START taking these medications   Details  methocarbamol (ROBAXIN) 500 MG tablet Take 1 tablet (500 mg total) by mouth 2 (two) times daily., Starting Fri 12/03/2015, Print       I personally performed the services described in this documentation, which was scribed in my presence. The recorded information has been reviewed and is accurate.    Garlon Hatchet, PA-C 12/03/15 1810    Courteney Randall An, MD 12/03/15 2223

## 2015-12-07 ENCOUNTER — Encounter (HOSPITAL_COMMUNITY): Payer: Self-pay

## 2015-12-07 ENCOUNTER — Emergency Department (HOSPITAL_COMMUNITY)
Admission: EM | Admit: 2015-12-07 | Discharge: 2015-12-07 | Disposition: A | Discharge status: Home / Self Care | Payer: Managed Care, Other (non HMO) | Source: Home / Self Care

## 2015-12-07 ENCOUNTER — Encounter (HOSPITAL_COMMUNITY)
Admission: RE | Admit: 2015-12-07 | Discharge: 2015-12-07 | Disposition: A | Discharge status: Home / Self Care | Payer: Managed Care, Other (non HMO) | Source: Ambulatory Visit | Attending: Orthopaedic Surgery | Admitting: Orthopaedic Surgery

## 2015-12-07 ENCOUNTER — Ambulatory Visit (HOSPITAL_COMMUNITY)
Admission: RE | Admit: 2015-12-07 | Discharge: 2015-12-07 | Disposition: A | Discharge status: Home / Self Care | Payer: Managed Care, Other (non HMO) | Source: Ambulatory Visit | Attending: Surgery | Admitting: Surgery

## 2015-12-07 ENCOUNTER — Encounter (HOSPITAL_COMMUNITY): Payer: Self-pay | Admitting: Emergency Medicine

## 2015-12-07 DIAGNOSIS — F909 Attention-deficit hyperactivity disorder, unspecified type: Secondary | ICD-10-CM | POA: Insufficient documentation

## 2015-12-07 DIAGNOSIS — Z87891 Personal history of nicotine dependence: Secondary | ICD-10-CM

## 2015-12-07 DIAGNOSIS — J8410 Pulmonary fibrosis, unspecified: Secondary | ICD-10-CM | POA: Insufficient documentation

## 2015-12-07 DIAGNOSIS — J45909 Unspecified asthma, uncomplicated: Secondary | ICD-10-CM | POA: Insufficient documentation

## 2015-12-07 DIAGNOSIS — N189 Chronic kidney disease, unspecified: Secondary | ICD-10-CM | POA: Insufficient documentation

## 2015-12-07 DIAGNOSIS — Z5321 Procedure and treatment not carried out due to patient leaving prior to being seen by health care provider: Secondary | ICD-10-CM | POA: Insufficient documentation

## 2015-12-07 DIAGNOSIS — Z01818 Encounter for other preprocedural examination: Secondary | ICD-10-CM

## 2015-12-07 DIAGNOSIS — Z96611 Presence of right artificial shoulder joint: Secondary | ICD-10-CM | POA: Insufficient documentation

## 2015-12-07 DIAGNOSIS — M5126 Other intervertebral disc displacement, lumbar region: Secondary | ICD-10-CM | POA: Diagnosis not present

## 2015-12-07 DIAGNOSIS — M549 Dorsalgia, unspecified: Secondary | ICD-10-CM

## 2015-12-07 LAB — COMPREHENSIVE METABOLIC PANEL
ALT: 17 U/L (ref 14–54)
ANION GAP: 10 (ref 5–15)
AST: 18 U/L (ref 15–41)
Albumin: 3.9 g/dL (ref 3.5–5.0)
Alkaline Phosphatase: 90 U/L (ref 38–126)
BUN: 14 mg/dL (ref 6–20)
CHLORIDE: 102 mmol/L (ref 101–111)
CO2: 27 mmol/L (ref 22–32)
CREATININE: 0.73 mg/dL (ref 0.44–1.00)
Calcium: 9.8 mg/dL (ref 8.9–10.3)
Glucose, Bld: 93 mg/dL (ref 65–99)
Potassium: 3.8 mmol/L (ref 3.5–5.1)
SODIUM: 139 mmol/L (ref 135–145)
Total Bilirubin: 0.7 mg/dL (ref 0.3–1.2)
Total Protein: 7.5 g/dL (ref 6.5–8.1)

## 2015-12-07 LAB — URINALYSIS, ROUTINE W REFLEX MICROSCOPIC
Bilirubin Urine: NEGATIVE
GLUCOSE, UA: NEGATIVE mg/dL
KETONES UR: NEGATIVE mg/dL
LEUKOCYTES UA: NEGATIVE
Nitrite: NEGATIVE
PROTEIN: NEGATIVE mg/dL
Specific Gravity, Urine: 1.02 (ref 1.005–1.030)
pH: 5.5 (ref 5.0–8.0)

## 2015-12-07 LAB — CBC
HCT: 43.3 % (ref 36.0–46.0)
Hemoglobin: 13.7 g/dL (ref 12.0–15.0)
MCH: 27.9 pg (ref 26.0–34.0)
MCHC: 31.6 g/dL (ref 30.0–36.0)
MCV: 88.2 fL (ref 78.0–100.0)
PLATELETS: 416 10*3/uL — AB (ref 150–400)
RBC: 4.91 MIL/uL (ref 3.87–5.11)
RDW: 13.3 % (ref 11.5–15.5)
WBC: 11.6 10*3/uL — ABNORMAL HIGH (ref 4.0–10.5)

## 2015-12-07 LAB — PROTIME-INR
INR: 1
PROTHROMBIN TIME: 13.2 s (ref 11.4–15.2)

## 2015-12-07 LAB — APTT: aPTT: 26 seconds (ref 24–36)

## 2015-12-07 LAB — URINE MICROSCOPIC-ADD ON
BACTERIA UA: NONE SEEN
WBC, UA: NONE SEEN WBC/hpf (ref 0–5)

## 2015-12-07 LAB — SURGICAL PCR SCREEN
MRSA, PCR: NEGATIVE
STAPHYLOCOCCUS AUREUS: POSITIVE — AB

## 2015-12-07 NOTE — Pre-Procedure Instructions (Addendum)
Latrenda Irani  12/07/2015      [id number] [place],  - [address] AT CORNER OF [facility] [address] [place] [address] Phone: [phone] Fax: [phone]  [facility] - [place], [place] - [address] [place] [address] Phone: [phone] Fax: [phone]    Your procedure is scheduled on 12/10/15.  Report to [facility] Admitting at 800 A.M.  Call this number if you have problems the morning of surgery:  [phone]   Remember:  Do not eat food or drink liquids after midnight.  Take these medicines the morning of surgery with A SIP OF WATER xanax if needed,oxycodone if needed,effexor  STOP all herbel meds, nsaids (aleve,naproxen,advil,ibuprofen)  Starting today(11/06/15) including diclfenac, meloxicam, all vitamins,aspirin   Do not wear jewelry, make-up or nail polish.  Do not wear lotions, powders, or perfumes, or deoderant.  Do not shave 48 hours prior to surgery.  Men may shave face and neck.  Do not bring valuables to the hospital.  [facility] is not responsible for any belongings or valuables.  Contacts, dentures or bridgework may not be worn into surgery.  Leave your suitcase in the car.  After surgery it may be brought to your room.  For patients admitted to the hospital, discharge time will be determined by your treatment team.  Patients discharged the day of surgery will not be allowed to drive home.   Name and phone number of your driver:    Special instructions:   Special Instructions: [facility] - Preparing for Surgery  Before surgery, you can play an important role.  Because skin is not sterile, your skin needs to be as free of germs as possible.  You can reduce the number of germs on you skin by washing with CHG (chlorahexidine gluconate) soap before surgery.  CHG is an antiseptic cleaner which kills germs and bonds with the skin to continue killing germs even  after washing.  Please DO NOT use if you have an allergy to CHG or antibacterial soaps.  If your skin becomes reddened/irritated stop using the CHG and inform your nurse when you arrive at Short Stay.  Do not shave (including legs and underarms) for at least 48 hours prior to the first CHG shower.  You may shave your face.  Please follow these instructions carefully:   1.  Shower with CHG Soap the night before surgery and the morning of Surgery.  2.  If you choose to wash your hair, wash your hair first as usual with your normal shampoo.  3.  After you shampoo, rinse your hair and body thoroughly to remove the Shampoo.  4.  Use CHG as you would any other liquid soap.  You can apply chg directly  to the skin and wash gently with scrungie or a clean washcloth.  5.  Apply the CHG Soap to your body ONLY FROM THE NECK DOWN.  Do not use on open wounds or open sores.  Avoid contact with your eyes ears, mouth and genitals (private parts).  Wash genitals (private parts)       with your normal soap.  6.  Wash thoroughly, paying special attention to the area where your surgery will be performed.  7.  Thoroughly rinse your body with warm water from the neck down.  8.  DO NOT shower/wash with your normal soap after using and rinsing off the CHG Soap.  9.  Pat yourself dry with a clean towel.            10.  Wear clean pajamas.            11.  Place clean sheets on your bed the night of your first shower and do not sleep with pets.  Day of Surgery  Do not apply any lotions/deodorants the morning of surgery.  Please wear clean clothes to the hospital/surgery center.  Please read over the  fact sheets that you were given.

## 2015-12-07 NOTE — ED Triage Notes (Signed)
Pt from home with c/o back pain with scheduled surgery this Fri.  Pt states she is out of pain medication as of yesterday.  Pt additionally states she is having pain all over from an RA flare.  Pt crying in triage.  Alert.

## 2015-12-07 NOTE — Progress Notes (Signed)
I called a [facility] [address], [place], [place].

## 2015-12-07 NOTE — ED Notes (Signed)
Patient was called 2x no response.

## 2015-12-07 NOTE — ED Notes (Signed)
Pt was offered percocet per standing orders and refused to wait for pain medication via IV.  Pt states she is prescribed oxy or dilaudid at home.

## 2015-12-07 NOTE — Progress Notes (Signed)
Saw patient at 100 pm. crying ,upset. Stated did not have any pain med- had run out. After preop visit patient taken to ed

## 2015-12-09 MED ORDER — CEFAZOLIN SODIUM-DEXTROSE 2-4 GM/100ML-% IV SOLN
2.0000 g | INTRAVENOUS | Status: AC
Start: 2015-12-10 — End: 2015-12-10
  Administered 2015-12-10: 2 g via INTRAVENOUS
  Filled 2015-12-09: qty 100

## 2015-12-09 NOTE — H&P (Signed)
Kara [name] is an 60 y.o. female.   Patient returns.  She has had 4 falling episodes since I last saw her with her right leg giving way.  She went to the emergency room after falling.  She was having leg pain which was rated as severe.  MRI scan was obtained in the emergency room which showed a large disk herniation with inferior migration from the L3-4 level which caused compression.  Epidural was not effective, and she has fallen since that time.  MRI scan is reviewed.  This shows the previous L4 to S1 interbody fusion in satisfactory position.  She has central and right large disk extrusion caudally migrated back behind the L4 lamina on the right side causing moderate to severe spinal stenosis centrally with significant nerve root compression.   MEDICATIONS:  She does take Xanax and zolpidem occasionally at night.  She also takes Effexor.   PAST SURGICAL HISTORY:  She has had shoulder replacement surgery, removal of implants, OB/GYN sling procedure.   FAMILY HISTORY:  Positive for breast and skin cancer, hypertension.   SOCIAL HISTORY:  She is married to her husband, Louretta Parma, who is with her today.  She does not work.  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   FOURTEEN-POINT REVIEW OF SYSTEMS:  Positive for anxiety, arthritis, previous lumbar fusion, depression, fibromyalgia, and osteopenia.     Past Medical History:  Diagnosis Date  . ADD (attention deficit disorder)    on Adderal  . Aggressive behavior of adult   . Allergy   . Anxiety   . Arthritis   . Asthma    as a child  . Cellulitis of breast 11/2013   RIGHT BREAST  . Chronic fatigue and immune dysfunction syndrome   . Chronic kidney disease    due to infection from stone- no regular visits- fine now  . Chronic pain    goes to [facility] for pain control  . Cold sore   . 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 Depression     takes meds daily  . Family history of malignant neoplasm of breast   . Fibromyalgia   . Nephrolithiasis   . Osteoporosis   . Other specified rheumatoid arthritis, right shoulder (HCC) 08/01/2011  . Post-nasal drip    hx of  . Rheumatoid arthritis(714.0)    autoimmune arthritis; methotrexate once/week  . Urinary incontinence     Past Surgical History:  Procedure Laterality Date  . ANTERIOR CERVICAL DECOMP/DISCECTOMY FUSION N/A 03/15/2015   Procedure: Cervical five-six, Cerival six-seven, Anterior Cervical Discectomy and Fusion, Allograft and Plate;  Surgeon: Eldred Manges, MD;  Location: [facility];  Service: Orthopedics;  Laterality: N/A;  . BACK SURGERY  2011,16   lower back fusion l4-5, s1  . BLADDER SURGERY  2009  . BREAST ENHANCEMENT SURGERY  2003  . breast implants removed    . BREAST SURGERY  10/2013   REMOVAL OF BREAST IMPLANTS  . INCISION AND DRAINAGE ABSCESS Right 01/16/2014   Procedure: INCISION AND DRAINAGE AND OF RIGHT BREAST ABCESS;  Surgeon: Glenna Fellows, MD;  Location: [facility];  Service: General;  Laterality: Right;  . LIPOSUCTION  2003   abdominal  . MASS EXCISION  11/03/2011   Procedure: MINOR EXCISION OF MASS;  Surgeon: Wyn Forster., MD;  Location: [facility];  Service: Orthopedics;  Laterality: Left;  debride IP joint, cyst excision left index  . SHOULDER SURGERY  2012   right  . TONSILLECTOMY  1987  . TOTAL SHOULDER ARTHROPLASTY  08/01/2011   Procedure: TOTAL SHOULDER ARTHROPLASTY;  Surgeon: Eulas Post, MD;  Location: [facility];  Service: Orthopedics;  Laterality: Right;  Right total shoulder arthroplasty  . TUBAL LIGATION  1988    Family History  Problem Relation Age of Onset  . Arthritis Mother   . Heart disease Mother     ?psvt  . Breast cancer Mother 48    TAH/BSO  . Cancer Mother   . Mental illness Mother   . COPD Father   . Hypertension Father   . Alcohol abuse Father   . Mental illness Father   . Heart disease Father   .  Healthy Daughter   . Breast cancer Maternal Aunt 27    deceased  . Cancer Cousin 61    female; unknown primary  . Colon cancer Paternal Aunt 98    deceased at 7  . Stomach cancer Paternal Uncle 20    deceased at 88  . Alcohol abuse Brother   . Cancer Brother   . Hodgkin's lymphoma Brother   . Mental illness Brother   . HIV Brother   . Healthy Brother   . Healthy Son    Social History:  reports that she quit smoking about 10 years ago. Her smoking use included Cigarettes. She has a 5.00 pack-year smoking history. She has never used smokeless tobacco. She reports that she does not drink alcohol or use drugs.  Allergies:  Allergies  Allergen Reactions  . Aspirin Anaphylaxis  . Ibuprofen Anaphylaxis  . Ativan [Lorazepam] Other (See Comments)    Makes agitated, combative   . Ketamine Other (See Comments)    Hallucinations  . Toradol [Ketorolac Tromethamine] Itching, Swelling and Rash    UNSPECIFIED REACTION   . Tramadol Hcl Itching, Swelling and Rash    SWELLING REACTION UNSPECIFIED     No prescriptions prior to admission.    No results found for this or any previous visit (from the past 48 hour(s)). No results found.  Review of Systems  Constitutional: Negative.   HENT: Negative.   Eyes: Negative.   Respiratory: Negative.   Cardiovascular: Negative.   Gastrointestinal: Negative.   Genitourinary: Negative.   Musculoskeletal: Negative.   Skin: Negative.     There were no vitals taken for this visit. Physical Exam  Constitutional: She is oriented to person, place, and time. She appears well-nourished. No distress.  HENT:  Head: Normocephalic and atraumatic.  Eyes: EOM are normal. Pupils are equal, round, and reactive to light.  Neck: Normal range of motion.  Cardiovascular: Normal rate.   Respiratory: Effort normal. No respiratory distress.  GI: She exhibits no distension.  Neurological: She is alert and oriented to person, place, and time.  Skin: Skin is  warm and dry.    PHYSICAL EXAMINATION:  Height 5 feet 4 inches, weight 139 pounds.  Extraocular movements intact.  She is crying, tearful, lying down.  She has significant quad weakness on the right.  Positive reverse straight leg raising.  Absent knee jerk, 2+ ankle jerk.  No abdominal tenderness.  No audible wheezing.   ASSESSMENT:  Status post L4 to S1 fusion with L3-4 HNP, now with severe spinal stenosis, free fragment, and compression above a solid 4-to-1 fusion.   PLAN:  Epidural has not worked.  We will place her in a knee immobilizer.  She will use her walker.  She has to keep the knee immobilizer on when she is up to keep her quad from giving way and causing her to fall.  Percocet 10's prescribed for pain.  We will set her up for hemilaminectomy and removal of the free fragment.  She likely could stay overnight and go home the next day.  Risks of surgery discussed including progression of disk degeneration, potential for need for fusion at some point in the future, re-operation, risk of infection.  All questions answered.  She requests we proceed.    Naida Sleight, PA-C 12/09/2015, 5:24 PM

## 2015-12-10 ENCOUNTER — Ambulatory Visit (HOSPITAL_COMMUNITY): Payer: Managed Care, Other (non HMO)

## 2015-12-10 ENCOUNTER — Ambulatory Visit (HOSPITAL_COMMUNITY): Payer: Managed Care, Other (non HMO) | Admitting: Anesthesiology

## 2015-12-10 ENCOUNTER — Inpatient Hospital Stay (HOSPITAL_COMMUNITY)
Admission: AD | Admit: 2015-12-10 | Discharge: 2015-12-12 | DRG: 517 | Disposition: A | Discharge status: Home / Self Care | Payer: Managed Care, Other (non HMO) | Source: Ambulatory Visit | Attending: Orthopaedic Surgery | Admitting: Orthopaedic Surgery

## 2015-12-10 ENCOUNTER — Encounter (HOSPITAL_COMMUNITY): Payer: Self-pay | Admitting: *Deleted

## 2015-12-10 ENCOUNTER — Encounter (HOSPITAL_COMMUNITY)
Admission: AD | Disposition: A | Discharge status: Home / Self Care | Payer: Self-pay | Source: Ambulatory Visit | Attending: Orthopaedic Surgery

## 2015-12-10 DIAGNOSIS — Z87891 Personal history of nicotine dependence: Secondary | ICD-10-CM | POA: Diagnosis not present

## 2015-12-10 DIAGNOSIS — F909 Attention-deficit hyperactivity disorder, unspecified type: Secondary | ICD-10-CM | POA: Diagnosis present

## 2015-12-10 DIAGNOSIS — Z886 Allergy status to analgesic agent status: Secondary | ICD-10-CM | POA: Diagnosis not present

## 2015-12-10 DIAGNOSIS — Z888 Allergy status to other drugs, medicaments and biological substances status: Secondary | ICD-10-CM | POA: Diagnosis not present

## 2015-12-10 DIAGNOSIS — Z981 Arthrodesis status: Secondary | ICD-10-CM | POA: Diagnosis not present

## 2015-12-10 DIAGNOSIS — Z8261 Family history of arthritis: Secondary | ICD-10-CM | POA: Diagnosis not present

## 2015-12-10 DIAGNOSIS — M5126 Other intervertebral disc displacement, lumbar region: Secondary | ICD-10-CM | POA: Diagnosis present

## 2015-12-10 DIAGNOSIS — F329 Major depressive disorder, single episode, unspecified: Secondary | ICD-10-CM | POA: Diagnosis present

## 2015-12-10 DIAGNOSIS — F418 Other specified anxiety disorders: Secondary | ICD-10-CM | POA: Diagnosis present

## 2015-12-10 DIAGNOSIS — M06811 Other specified rheumatoid arthritis, right shoulder: Secondary | ICD-10-CM | POA: Diagnosis present

## 2015-12-10 DIAGNOSIS — M4806 Spinal stenosis, lumbar region: Secondary | ICD-10-CM | POA: Diagnosis present

## 2015-12-10 DIAGNOSIS — Z419 Encounter for procedure for purposes other than remedying health state, unspecified: Secondary | ICD-10-CM

## 2015-12-10 DIAGNOSIS — Z818 Family history of other mental and behavioral disorders: Secondary | ICD-10-CM | POA: Diagnosis not present

## 2015-12-10 DIAGNOSIS — R32 Unspecified urinary incontinence: Secondary | ICD-10-CM | POA: Diagnosis present

## 2015-12-10 DIAGNOSIS — Z8249 Family history of ischemic heart disease and other diseases of the circulatory system: Secondary | ICD-10-CM

## 2015-12-10 DIAGNOSIS — M797 Fibromyalgia: Secondary | ICD-10-CM | POA: Diagnosis present

## 2015-12-10 HISTORY — PX: LUMBAR LAMINECTOMY/DECOMPRESSION MICRODISCECTOMY: SHX5026

## 2015-12-10 SURGERY — LUMBAR LAMINECTOMY/DECOMPRESSION MICRODISCECTOMY
Anesthesia: General | Site: Back

## 2015-12-10 MED ORDER — HYDROMORPHONE HCL 1 MG/ML IJ SOLN: 0.5000 mg | INTRAMUSCULAR | Status: DC | PRN

## 2015-12-10 MED ORDER — PROPOFOL 10 MG/ML IV BOLUS
INTRAVENOUS | Status: DC | PRN
  Administered 2015-12-10: 150 mg via INTRAVENOUS

## 2015-12-10 MED ORDER — PHENOL 1.4 % MT LIQD: 1.0000 | OROMUCOSAL | Status: DC | PRN

## 2015-12-10 MED ORDER — NEOSTIGMINE METHYLSULFATE 10 MG/10ML IV SOLN
INTRAVENOUS | Status: DC | PRN
  Administered 2015-12-10: 2 mg via INTRAVENOUS

## 2015-12-10 MED ORDER — HYDROMORPHONE HCL 1 MG/ML IJ SOLN
0.2500 mg | INTRAMUSCULAR | Status: DC | PRN
  Administered 2015-12-10 (×3): 0.5 mg via INTRAVENOUS

## 2015-12-10 MED ORDER — SODIUM CHLORIDE 0.9% FLUSH
3.0000 mL | Freq: Two times a day (BID) | INTRAVENOUS | Status: DC
  Administered 2015-12-11: 3 mL via INTRAVENOUS

## 2015-12-10 MED ORDER — GLYCOPYRROLATE 0.2 MG/ML IJ SOLN
INTRAMUSCULAR | Status: DC | PRN
  Administered 2015-12-10: 0.3 mg via INTRAVENOUS

## 2015-12-10 MED ORDER — ACETAMINOPHEN 325 MG PO TABS: 650.0000 mg | ORAL_TABLET | ORAL | Status: DC | PRN

## 2015-12-10 MED ORDER — MEPERIDINE HCL 25 MG/ML IJ SOLN: 6.2500 mg | INTRAMUSCULAR | Status: DC | PRN

## 2015-12-10 MED ORDER — PROMETHAZINE HCL 25 MG/ML IJ SOLN: 6.2500 mg | INTRAMUSCULAR | Status: DC | PRN

## 2015-12-10 MED ORDER — HYDROMORPHONE HCL 1 MG/ML IJ SOLN
0.5000 mg | INTRAMUSCULAR | Status: DC | PRN
  Administered 2015-12-10: 0.5 mg via INTRAVENOUS

## 2015-12-10 MED ORDER — AMPHETAMINE-DEXTROAMPHET ER 10 MG PO CP24
30.0000 mg | ORAL_CAPSULE | ORAL | Status: DC
  Administered 2015-12-11 (×2): 30 mg via ORAL
  Filled 2015-12-10 (×2): qty 3

## 2015-12-10 MED ORDER — FENTANYL CITRATE (PF) 100 MCG/2ML IJ SOLN
INTRAMUSCULAR | Status: DC | PRN
  Administered 2015-12-10 (×4): 50 ug via INTRAVENOUS
  Administered 2015-12-10: 100 ug via INTRAVENOUS

## 2015-12-10 MED ORDER — ZOLPIDEM TARTRATE 5 MG PO TABS
5.0000 mg | ORAL_TABLET | Freq: Every evening | ORAL | Status: DC | PRN
  Administered 2015-12-10 – 2015-12-11 (×2): 5 mg via ORAL
  Filled 2015-12-10 (×2): qty 1

## 2015-12-10 MED ORDER — METHOCARBAMOL 1000 MG/10ML IJ SOLN
500.0000 mg | INTRAVENOUS | Status: AC
  Administered 2015-12-10: 500 mg via INTRAVENOUS
  Filled 2015-12-10: qty 5

## 2015-12-10 MED ORDER — HYDROMORPHONE HCL 1 MG/ML IJ SOLN
INTRAMUSCULAR | Status: AC
  Filled 2015-12-10: qty 2

## 2015-12-10 MED ORDER — ACETAMINOPHEN 650 MG RE SUPP: 650.0000 mg | RECTAL | Status: DC | PRN

## 2015-12-10 MED ORDER — HYDROMORPHONE HCL 1 MG/ML IJ SOLN
0.2500 mg | INTRAMUSCULAR | Status: DC | PRN
  Administered 2015-12-10: 1 mg via INTRAVENOUS

## 2015-12-10 MED ORDER — POLYETHYLENE GLYCOL 3350 17 G PO PACK: 17.0000 g | PACK | Freq: Every day | ORAL | Status: DC | PRN

## 2015-12-10 MED ORDER — PHENYLEPHRINE HCL 10 MG/ML IJ SOLN
INTRAMUSCULAR | Status: DC | PRN
  Administered 2015-12-10 (×6): 80 ug via INTRAVENOUS

## 2015-12-10 MED ORDER — EPHEDRINE SULFATE 50 MG/ML IJ SOLN
INTRAMUSCULAR | Status: DC | PRN
  Administered 2015-12-10: 10 mg via INTRAVENOUS

## 2015-12-10 MED ORDER — LACTATED RINGERS IV SOLN
INTRAVENOUS | Status: DC
Start: 2015-12-10 — End: 2015-12-12
  Administered 2015-12-10 (×2): via INTRAVENOUS

## 2015-12-10 MED ORDER — METHOCARBAMOL 1000 MG/10ML IJ SOLN
500.0000 mg | Freq: Four times a day (QID) | INTRAMUSCULAR | Status: DC | PRN
  Filled 2015-12-10: qty 5

## 2015-12-10 MED ORDER — DOCUSATE SODIUM 100 MG PO CAPS
100.0000 mg | ORAL_CAPSULE | Freq: Two times a day (BID) | ORAL | Status: DC
  Administered 2015-12-10 – 2015-12-12 (×2): 100 mg via ORAL
  Filled 2015-12-10 (×4): qty 1

## 2015-12-10 MED ORDER — SODIUM CHLORIDE 0.9 % IV SOLN: 250.0000 mL | INTRAVENOUS | Status: DC

## 2015-12-10 MED ORDER — ROCURONIUM BROMIDE 100 MG/10ML IV SOLN
INTRAVENOUS | Status: DC | PRN
  Administered 2015-12-10: 50 mg via INTRAVENOUS

## 2015-12-10 MED ORDER — OXYCODONE-ACETAMINOPHEN 10-325 MG PO TABS: 1.0000 | ORAL_TABLET | Freq: Four times a day (QID) | ORAL | 0 refills | Status: DC | PRN

## 2015-12-10 MED ORDER — LACTATED RINGERS IV SOLN: INTRAVENOUS | Status: DC

## 2015-12-10 MED ORDER — HYDROMORPHONE HCL 1 MG/ML IJ SOLN
INTRAMUSCULAR | Status: AC
  Filled 2015-12-10: qty 1

## 2015-12-10 MED ORDER — PROPOFOL 10 MG/ML IV BOLUS
INTRAVENOUS | Status: AC
  Filled 2015-12-10: qty 20

## 2015-12-10 MED ORDER — MENTHOL 3 MG MT LOZG: 1.0000 | LOZENGE | OROMUCOSAL | Status: DC | PRN

## 2015-12-10 MED ORDER — ALPRAZOLAM 0.5 MG PO TABS
1.0000 mg | ORAL_TABLET | Freq: Two times a day (BID) | ORAL | Status: DC | PRN
  Administered 2015-12-11: 1 mg via ORAL
  Filled 2015-12-10: qty 2

## 2015-12-10 MED ORDER — SODIUM CHLORIDE 0.9% FLUSH: 3.0000 mL | INTRAVENOUS | Status: DC | PRN

## 2015-12-10 MED ORDER — CHLORHEXIDINE GLUCONATE 4 % EX LIQD: 60.0000 mL | Freq: Once | CUTANEOUS | Status: DC

## 2015-12-10 MED ORDER — ACETAMINOPHEN 10 MG/ML IV SOLN
INTRAVENOUS | Status: AC
  Filled 2015-12-10: qty 100

## 2015-12-10 MED ORDER — 0.9 % SODIUM CHLORIDE (POUR BTL) OPTIME
TOPICAL | Status: DC | PRN
  Administered 2015-12-10: 1000 mL

## 2015-12-10 MED ORDER — ONDANSETRON HCL 4 MG/2ML IJ SOLN
INTRAMUSCULAR | Status: DC | PRN
  Administered 2015-12-10: 4 mg via INTRAVENOUS

## 2015-12-10 MED ORDER — HYDROMORPHONE HCL 1 MG/ML IJ SOLN
0.5000 mg | INTRAMUSCULAR | Status: DC | PRN
  Administered 2015-12-10 – 2015-12-12 (×6): 1 mg via INTRAVENOUS
  Filled 2015-12-10 (×6): qty 1

## 2015-12-10 MED ORDER — SODIUM CHLORIDE 0.9 % IV SOLN
INTRAVENOUS | Status: DC
  Administered 2015-12-10 – 2015-12-11 (×2): via INTRAVENOUS

## 2015-12-10 MED ORDER — METHOCARBAMOL 500 MG PO TABS
500.0000 mg | ORAL_TABLET | Freq: Four times a day (QID) | ORAL | Status: DC | PRN
  Administered 2015-12-10 – 2015-12-12 (×6): 500 mg via ORAL
  Filled 2015-12-10 (×6): qty 1

## 2015-12-10 MED ORDER — OXYCODONE-ACETAMINOPHEN 5-325 MG PO TABS
1.0000 | ORAL_TABLET | ORAL | Status: DC | PRN
  Administered 2015-12-10 – 2015-12-11 (×5): 2 via ORAL
  Filled 2015-12-10 (×6): qty 2

## 2015-12-10 MED ORDER — VENLAFAXINE HCL ER 37.5 MG PO CP24
37.5000 mg | ORAL_CAPSULE | Freq: Every day | ORAL | Status: DC
  Administered 2015-12-10 – 2015-12-11 (×2): 37.5 mg via ORAL
  Filled 2015-12-10 (×2): qty 1

## 2015-12-10 MED ORDER — CEFAZOLIN IN D5W 1 GM/50ML IV SOLN
1.0000 g | Freq: Three times a day (TID) | INTRAVENOUS | Status: AC
  Administered 2015-12-10 – 2015-12-11 (×2): 1 g via INTRAVENOUS
  Filled 2015-12-10 (×2): qty 50

## 2015-12-10 MED ORDER — ACETAMINOPHEN 10 MG/ML IV SOLN
1000.0000 mg | Freq: Once | INTRAVENOUS | Status: AC
  Administered 2015-12-10: 1000 mg via INTRAVENOUS

## 2015-12-10 MED ORDER — FENTANYL CITRATE (PF) 100 MCG/2ML IJ SOLN
INTRAMUSCULAR | Status: AC
  Filled 2015-12-10: qty 2

## 2015-12-10 MED ORDER — METHOCARBAMOL 500 MG PO TABS: 500.0000 mg | ORAL_TABLET | Freq: Four times a day (QID) | ORAL | 0 refills | Status: DC | PRN

## 2015-12-10 MED ORDER — MIDAZOLAM HCL 2 MG/2ML IJ SOLN
INTRAMUSCULAR | Status: AC
  Filled 2015-12-10: qty 2

## 2015-12-10 MED ORDER — ONDANSETRON HCL 4 MG/2ML IJ SOLN
4.0000 mg | INTRAMUSCULAR | Status: DC | PRN
  Administered 2015-12-10: 4 mg via INTRAVENOUS
  Filled 2015-12-10: qty 2

## 2015-12-10 MED ORDER — MIDAZOLAM HCL 5 MG/5ML IJ SOLN
INTRAMUSCULAR | Status: DC | PRN
  Administered 2015-12-10: 2 mg via INTRAVENOUS

## 2015-12-10 MED ORDER — FENTANYL CITRATE (PF) 100 MCG/2ML IJ SOLN
INTRAMUSCULAR | Status: AC
  Filled 2015-12-10: qty 4

## 2015-12-10 MED ORDER — BUPIVACAINE HCL (PF) 0.25 % IJ SOLN
INTRAMUSCULAR | Status: AC
  Filled 2015-12-10: qty 30

## 2015-12-10 SURGICAL SUPPLY — 41 items
BENZOIN TINCTURE PRP APPL 2/3 (GAUZE/BANDAGES/DRESSINGS) ×2 IMPLANT
BUR ROUND FLUTED 4 SOFT TCH (BURR) IMPLANT
CANISTER SUCT 3000ML PPV (MISCELLANEOUS) ×2 IMPLANT
CLSR STERI-STRIP ANTIMIC 1/2X4 (GAUZE/BANDAGES/DRESSINGS) ×2 IMPLANT
COVER SURGICAL LIGHT HANDLE (MISCELLANEOUS) ×2 IMPLANT
DECANTER SPIKE VIAL GLASS SM (MISCELLANEOUS) ×2 IMPLANT
DERMABOND ADVANCED (GAUZE/BANDAGES/DRESSINGS) ×1
DERMABOND ADVANCED .7 DNX12 (GAUZE/BANDAGES/DRESSINGS) ×1 IMPLANT
DRAPE MICROSCOPE LEICA (MISCELLANEOUS) ×2 IMPLANT
DRAPE PROXIMA HALF (DRAPES) ×4 IMPLANT
DRAPE SURG 17X23 STRL (DRAPES) ×2 IMPLANT
DRSG MEPILEX BORDER 4X4 (GAUZE/BANDAGES/DRESSINGS) ×2 IMPLANT
DURAPREP 26ML APPLICATOR (WOUND CARE) ×2 IMPLANT
ELECT REM PT RETURN 9FT ADLT (ELECTROSURGICAL) ×2
ELECTRODE REM PT RTRN 9FT ADLT (ELECTROSURGICAL) ×1 IMPLANT
GLOVE BIOGEL PI IND STRL 8 (GLOVE) ×2 IMPLANT
GLOVE BIOGEL PI INDICATOR 8 (GLOVE) ×2
GLOVE ORTHO TXT STRL SZ7.5 (GLOVE) ×4 IMPLANT
GLOVE SURG SS PI 8.0 STRL IVOR (GLOVE) ×2 IMPLANT
GOWN STRL REUS W/ TWL LRG LVL3 (GOWN DISPOSABLE) ×2 IMPLANT
GOWN STRL REUS W/ TWL XL LVL3 (GOWN DISPOSABLE) ×1 IMPLANT
GOWN STRL REUS W/TWL 2XL LVL3 (GOWN DISPOSABLE) ×4 IMPLANT
GOWN STRL REUS W/TWL LRG LVL3 (GOWN DISPOSABLE) ×2
GOWN STRL REUS W/TWL XL LVL3 (GOWN DISPOSABLE) ×1
KIT BASIN OR (CUSTOM PROCEDURE TRAY) ×2 IMPLANT
KIT ROOM TURNOVER OR (KITS) ×2 IMPLANT
MANIFOLD NEPTUNE II (INSTRUMENTS) ×2 IMPLANT
NEEDLE HYPO 25GX1X1/2 BEV (NEEDLE) ×2 IMPLANT
NEEDLE SPNL 18GX3.5 QUINCKE PK (NEEDLE) ×2 IMPLANT
NS IRRIG 1000ML POUR BTL (IV SOLUTION) ×2 IMPLANT
PACK LAMINECTOMY ORTHO (CUSTOM PROCEDURE TRAY) ×2 IMPLANT
PAD ARMBOARD 7.5X6 YLW CONV (MISCELLANEOUS) ×4 IMPLANT
PATTIES SURGICAL .5 X.5 (GAUZE/BANDAGES/DRESSINGS) IMPLANT
PATTIES SURGICAL .75X.75 (GAUZE/BANDAGES/DRESSINGS) IMPLANT
SUT VIC AB 0 CT1 27 (SUTURE) ×1
SUT VIC AB 0 CT1 27XBRD ANBCTR (SUTURE) ×1 IMPLANT
SUT VIC AB 2-0 CT1 27 (SUTURE) ×1
SUT VIC AB 2-0 CT1 TAPERPNT 27 (SUTURE) ×1 IMPLANT
SUT VIC AB 3-0 X1 27 (SUTURE) IMPLANT
TOWEL OR 17X24 6PK STRL BLUE (TOWEL DISPOSABLE) ×2 IMPLANT
TOWEL OR 17X26 10 PK STRL BLUE (TOWEL DISPOSABLE) ×2 IMPLANT

## 2015-12-10 NOTE — Interval H&P Note (Signed)
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 After consideration of risks, benefits and other options for treatment, the patient has consented to  Procedure(s): Right L3-4 Microdiscectomy, Right L4 Hemilaminectomy, Removal Free Fragment (N/A) as a surgical intervention .  The patient's history has been reviewed, patient examined, no change in status, stable for surgery.  I have reviewed the patient's chart and labs.  Questions were answered to the patient's satisfaction.     Eldred Manges

## 2015-12-10 NOTE — Interval H&P Note (Signed)
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 After consideration of risks, benefits and other options for treatment, the patient has consented to  Procedure(s): Right L3-4 Microdiscectomy, Right L4 Hemilaminectomy, Removal Free Fragment (N/A) as a surgical intervention .  The patient's history has been reviewed, patient examined, no change in status, stable for surgery.  I have reviewed the patient's chart and labs.  Questions were answered to the patient's satisfaction.     Eldred Manges

## 2015-12-10 NOTE — Discharge Instructions (Signed)
ORTHOPEDIC POSTOP INSTRUCTIONS  -OK TO SHOWER 5 DAYS POSTOP BUT DO NOT TUB SOAK.  DO NOT APPLY ANY CREAMS OR OINTMENTS TO INCISION.    -NO BENDING, SQUATTING, LIFTING, TWISTING.  -DO NOT DRIVE  -OK TO TO DO SOME WALKING BUT NOTHING TOO EXCESSIVE.

## 2015-12-10 NOTE — Anesthesia Preprocedure Evaluation (Addendum)
Anesthesia Evaluation  Patient identified by MRN, date of birth, ID band Patient awake    Reviewed: Allergy & Precautions, NPO status , Patient's Chart, lab work & pertinent test results  Airway Mallampati: II  TM Distance: >3 FB Neck ROM: Limited    Dental  (+) Teeth Intact, Dental Advisory Given   Pulmonary asthma , former smoker,    breath sounds clear to auscultation       Cardiovascular negative cardio ROS   Rhythm:Regular Rate:Normal     Neuro/Psych PSYCHIATRIC DISORDERS Anxiety Depression  Neuromuscular disease    GI/Hepatic negative GI ROS, Neg liver ROS,   Endo/Other  negative endocrine ROS  Renal/GU   negative genitourinary   Musculoskeletal  (+) Arthritis , Rheumatoid disorders,  Fibromyalgia -  Abdominal Normal abdominal exam  (+)   Peds negative pediatric ROS (+)  Hematology negative hematology ROS (+)   Anesthesia Other Findings   Reproductive/Obstetrics negative OB ROS                            Lab Results  Component Value Date   WBC 11.6 (H) 12/07/2015   HGB 13.7 12/07/2015   HCT 43.3 12/07/2015   MCV 88.2 12/07/2015   PLT 416 (H) 12/07/2015   Lab Results  Component Value Date   CREATININE 0.73 12/07/2015   BUN 14 12/07/2015   NA 139 12/07/2015   K 3.8 12/07/2015   CL 102 12/07/2015   CO2 27 12/07/2015   Lab Results  Component Value Date   INR 1.00 12/07/2015   INR 1.03 03/08/2015   INR 1.06 06/26/2014   11/2015 EKG: normal sinus rhythm.  11/2015 Echo - Left ventricle: The cavity size was normal. Wall thickness was   normal. Systolic function was normal. The estimated ejection   fraction was in the range of 60% to 65%. Wall motion was normal;   there were no regional wall motion abnormalities. Doppler   parameters are consistent with abnormal left ventricular   relaxation (grade 1 diastolic dysfunction).  Anesthesia Physical Anesthesia Plan  ASA:  II  Anesthesia Plan: General   Post-op Pain Management:    Induction: Intravenous  Airway Management Planned: Oral ETT  Additional Equipment:   Intra-op Plan:   Post-operative Plan: Extubation in OR  Informed Consent: I have reviewed the patients History and Physical, chart, labs and discussed the procedure including the risks, benefits and alternatives for the proposed anesthesia with the patient or authorized representative who has indicated his/her understanding and acceptance.   Dental advisory given  Plan Discussed with: CRNA  Anesthesia Plan Comments:         Anesthesia Quick Evaluation

## 2015-12-10 NOTE — Anesthesia Procedure Notes (Addendum)
Procedure Name: Intubation Date/Time: 12/10/2015 10:45 AM Performed by: Marena Chancy Pre-anesthesia Checklist: Patient identified, Emergency Drugs available, Suction available and Patient being monitored Patient Re-evaluated:Patient Re-evaluated prior to inductionOxygen Delivery Method: Circle System Utilized Preoxygenation: Pre-oxygenation with 100% oxygen Intubation Type: IV induction Ventilation: Mask ventilation without difficulty Laryngoscope Size: Miller and 2 Grade View: Grade II Tube type: Oral Tube size: 7.0 mm Number of attempts: 1 Airway Equipment and Method: Stylet and Oral airway Placement Confirmation: ETT inserted through vocal cords under direct vision,  positive ETCO2 and breath sounds checked- equal and bilateral Secured at: 21 cm Tube secured with: Tape Dental Injury: Teeth and Oropharynx as per pre-operative assessment

## 2015-12-10 NOTE — Brief Op Note (Signed)
12/10/2015  1:53 PM  PATIENT:  Kara [name]  60 y.o. 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, Free Fragment  PROCEDURE:  Procedure(s): Right L3-4 Hemilaminectomy, Excision of herniated nucleus pulposus (N/A)  SURGEON:  Surgeon(s) and Role:    * Eldred Manges, MD - Primary  PHYSICIAN ASSISTANT: Naida Sleight    ANESTHESIA:   general  EBL:  Total I/O In: 1500 [I.V.:1500] Out: 200 [Blood:200]  BLOOD ADMINISTERED:none  DRAINS: none   LOCAL MEDICATIONS USED:  MARCAINE     SPECIMEN:  No Specimen  DISPOSITION OF SPECIMEN:  N/A  COUNTS:  YES  TOURNIQUET:  * No tourniquets in log *  DICTATION: .Dragon Dictation  PLAN OF CARE: Admit to inpatient   PATIENT DISPOSITION:  PACU - hemodynamically stable.

## 2015-12-10 NOTE — Anesthesia Postprocedure Evaluation (Signed)
Anesthesia Post Note  Patient: Kara [name]  Procedure(s) Performed: Procedure(s) (LRB): Right L3-4 Hemilaminectomy, Excision of herniated nucleus pulposus (N/A)  Patient location during evaluation: PACU Anesthesia Type: General Level of consciousness: awake and alert Pain management: pain level controlled Vital Signs Assessment: post-procedure vital signs reviewed and stable Respiratory status: spontaneous breathing, nonlabored ventilation, respiratory function stable and patient connected to nasal cannula oxygen Cardiovascular status: blood pressure returned to baseline and stable Postop Assessment: no signs of nausea or vomiting Anesthetic complications: no    Last Vitals:  Vitals:   12/10/15 1500 12/10/15 1510  BP: (!) 85/44 (!) 92/53  Pulse: 68 85  Resp: 15   Temp:      Last Pain:  Vitals:   12/10/15 1445  TempSrc:   PainSc: Asleep                 Shelton Silvas

## 2015-12-10 NOTE — Transfer of Care (Signed)
Immediate Anesthesia Transfer of Care Note  Patient: Kara [name]  Procedure(s) Performed: Procedure(s): Right L3-4 Hemilaminectomy, Excision of herniated nucleus pulposus (N/A)  Patient Location: PACU  Anesthesia Type:General  Level of Consciousness: awake, alert  and oriented  Airway & Oxygen Therapy: Patient Spontanous Breathing and Patient connected to nasal cannula oxygen  Post-op Assessment: Report given to RN and Post -op Vital signs reviewed and stable  Post vital signs: Reviewed and stable  Last Vitals:  Vitals:   12/10/15 0821  BP: 116/67  Pulse: 80  Resp: 20  Temp: 37.1 C    Last Pain:  Vitals:   12/10/15 0859  TempSrc:   PainSc: 10-Worst pain ever      Patients Stated Pain Goal: 7 (12/10/15 0859)  Complications: No apparent anesthesia complications

## 2015-12-10 NOTE — Progress Notes (Signed)
Paged Dr Ophelia Charter for pain medication adjustments, patient insisting that something be done about her pain

## 2015-12-11 MED ORDER — OXYCODONE HCL 5 MG PO TABS
5.0000 mg | ORAL_TABLET | ORAL | Status: DC | PRN
  Administered 2015-12-11 – 2015-12-12 (×4): 10 mg via ORAL
  Filled 2015-12-11 (×4): qty 2

## 2015-12-11 MED ORDER — MORPHINE SULFATE (PF) 2 MG/ML IV SOLN
2.0000 mg | INTRAVENOUS | Status: DC | PRN
  Administered 2015-12-11 – 2015-12-12 (×10): 2 mg via INTRAVENOUS
  Filled 2015-12-11 (×10): qty 1

## 2015-12-11 MED ORDER — VENLAFAXINE HCL ER 75 MG PO CP24
75.0000 mg | ORAL_CAPSULE | Freq: Every day | ORAL | Status: DC
  Administered 2015-12-12: 75 mg via ORAL
  Filled 2015-12-11: qty 1

## 2015-12-11 MED ORDER — WHITE PETROLATUM GEL
Status: AC
  Administered 2015-12-11: 15:00:00
  Filled 2015-12-11: qty 1

## 2015-12-11 NOTE — Op Note (Signed)
NAMEMARIALIZ, FERREBEE NO.:  [id number]  MEDICAL RECORD NO.:  [id number]  LOCATION:  5N22C                        FACILITY:  [facility]  PHYSICIAN:  Miliani Deike C. Ophelia Charter, M.D.    DATE OF BIRTH:  10/21/1955  DATE OF PROCEDURE:  12/10/2015 DATE OF DISCHARGE:                              OPERATIVE REPORT   PREOPERATIVE DIAGNOSES:  Right L3-4 herniated nucleus pulposus with free fragment migrated fragment, and spinal stenosis.  POSTOPERATIVE DIAGNOSES:  Right L3-4 herniated nucleus pulposus with free fragment migrated fragment, and spinal stenosis.  PROCEDURES: 1. Right L3 hemilaminectomy. 2. Redo L4 laminectomy. 3. Removal of caudally-migrated free fragment.  SURGEON:  Trayson Stitely C. Ophelia Charter, M.D.  ASSISTANT:  Genene Churn. Barry Dienes, PA-C, medically necessary and present for the entire procedure.  ANESTHESIA:  GOT.  ESTIMATED BLOOD LOSS:  Minimal.  DESCRIPTION OF PROCEDURE:  After induction of general anesthesia, the patient was placed prone.  Spinal needle was placed.  X-ray was taken. Needle was 1 level too high.  It was moved down 1.  Repeat x-ray, skin marked, and then incision was made in the midline.  Subperiosteal dissection now was performed.  There was extensive scar tissue as expected with the patient's previous fusion done elsewhere with NuVasive spinous process plates and lateral cage that did not heal and required re-fixation for pseudarthrosis.  She had 2-level instrumented fusion, good position of cages, and had disk herniation from the level above that migrated down the canal down to the level of the pedicle causing stenosis from the migrated fragment.  The patient had previous TLIF procedure and the hemilaminectomy on the right was recurrent decompression with extensive scar tissue adherence of the dura. Operative microscope was used, and a total of 3-4 x-rays were taken intermittently throughout the case to confirm appropriate levels with probes down to the base of  the canal.  Initially, the lamina removal was a little bit high, we were too far above the 3-4 space and onto the pedicle at L3.  Continued caudal dissection through thick scar tissue. Bur 4 mm was used for thinning of the lamina, microdissection for the areas of adherent dura, removal of thick chunks of ligament, which was contributing to the stenosis and hemilaminectomy recurrent performed at L4 with protection of the dura using patties.  Thick chunks of ligament were removed.  Pocket was found.  Herniated fragment was removed in multiple pieces.  Continued dissection caudally from the level of the disk plate distally all the way down to the inferior aspect of the pedicle.  Exiting nerve root was well visualized, and axilla of the nerve root was checked with a Woodson, swept 180 degrees in the axilla to make sure there were no remaining fragments caudally.  The capsule where the fragment had been sitting was removed.  Hockey stick could be passed anterior to the dura.  180 degrees dura was soft.  No areas of residual compression.  Passes were made through the disk.  There was a firm bulge, but only a black nerve hook would fit in the disk, and a Penfield 4 would not fit into the disk space.  Disk was hard, and since this was  the level just above instrumented fusion, no further microdiskectomy was performed since the large free fragment had been removed correcting the patient's stenotic symptoms with lower extremity weakness and falling.  Copious lavage.  Standard layered closure with #1 Vicryl, 2-0 Vicryl; subcutaneous tissue with subcuticular closure, postop dressing, and transferred to the recovery room.  Instrument count and needle count were correct.     Eldo Umanzor C. Ophelia Charter, M.D.     MCY/MEDQ  D:  12/10/2015  T:  12/11/2015  Job:  [id number]

## 2015-12-11 NOTE — Evaluation (Signed)
Physical Therapy Evaluation Patient Details Name: Kara [name] MRN: [id number] DOB: 10/09/1955 Today's Date: 12/11/2015   History of Present Illness  Pt is a 60 y/o female s/p R L3 hemilaminectomy and redo L4 laminectomy. PMH including but not limited to fibromyalgia, RA, CKD, R TSA in 2013, lumbar fusion surgery in 2011 and 2016, and cervical decompression in 2016.  Clinical Impression  Pt presented supine in bed with HOB elevated, awake and willing to participate in therapy session. Pt stated that she has been walking around on the unit without assistance earlier today. Pt moved well during evaluation session; however, required frequent VC'ing to avoid breaking her spinal precautions. PT gave pt back precautions handout and reviewed with her. Pt also successfully completed stair training this session. Pt would continue to benefit from skilled physical therapy services at this time while admitted to address her limitations in order to improve her overall safety and independence with functional mobility.      Follow Up Recommendations Supervision for mobility/OOB    Equipment Recommendations  None recommended by PT    Recommendations for Other Services       Precautions / Restrictions Precautions Precautions: Back Precaution Booklet Issued: Yes (comment) Precaution Comments: PT reviewed precautions handout with pt. Restrictions Weight Bearing Restrictions: No      Mobility  Bed Mobility Overal bed mobility: Needs Assistance Bed Mobility: Rolling;Sidelying to Sit;Sit to Sidelying Rolling: Modified independent (Device/Increase time) Sidelying to sit: Supervision     Sit to sidelying: Supervision General bed mobility comments: pt required VC'ing for use of log roll technique  Transfers Overall transfer level: Needs assistance Equipment used: None Transfers: Sit to/from Stand Sit to Stand: Supervision         General transfer comment: pt required increased  time  Ambulation/Gait Ambulation/Gait assistance: Supervision Ambulation Distance (Feet): 400 Feet Assistive device: None Gait Pattern/deviations: Step-through pattern;Decreased stride length Gait velocity: decreased Gait velocity interpretation: Below normal speed for age/gender    Stairs Stairs: Yes Stairs assistance: Min assist Stair Management: One rail Left;Step to pattern;Forwards Number of Stairs: 4 General stair comments: pt ascended using a step-to pattern and descended using an alternating pattern  Wheelchair Mobility    Modified Rankin (Stroke Patients Only)       Balance Overall balance assessment: Needs assistance Sitting-balance support: Feet supported;No upper extremity supported Sitting balance-Leahy Scale: Good     Standing balance support: During functional activity;No upper extremity supported Standing balance-Leahy Scale: Good                               Pertinent Vitals/Pain Pain Assessment: 0-10 Pain Score: 7  Pain Location: lumbar spine Pain Descriptors / Indicators: Tightness;Grimacing;Guarding Pain Intervention(s): Monitored during session;Repositioned;Ice applied    Home Living Family/patient expects to be discharged to:: Private residence Living Arrangements: Spouse/significant other Available Help at Discharge: Family;Available 24 hours/day Type of Home: House Home Access: Level entry     Home Layout: One level Home Equipment: None      Prior Function Level of Independence: Independent               Hand Dominance        Extremity/Trunk Assessment   Upper Extremity Assessment: Overall WFL for tasks assessed           Lower Extremity Assessment: Overall WFL for tasks assessed      Cervical / Trunk Assessment: Other exceptions  Communication   Communication: No difficulties  Cognition Arousal/Alertness: Awake/alert Behavior During Therapy: WFL for tasks assessed/performed Overall Cognitive  Status: Within Functional Limits for tasks assessed                      General Comments      Exercises     Assessment/Plan    PT Assessment Patient needs continued PT services  PT Problem List Decreased strength;Decreased range of motion;Decreased activity tolerance;Decreased balance;Decreased mobility;Decreased coordination;Pain          PT Treatment Interventions Gait training;Stair training;Functional mobility training;Therapeutic activities;Therapeutic exercise;Balance training;Neuromuscular re-education;Patient/family education    PT Goals (Current goals can be found in the Care Plan section)  Acute Rehab PT Goals Patient Stated Goal: decrease pain PT Goal Formulation: With patient Time For Goal Achievement: 12/25/15 Potential to Achieve Goals: Good    Frequency Min 5X/week   Barriers to discharge        Co-evaluation               End of Session Equipment Utilized During Treatment: Gait belt Activity Tolerance: Patient limited by pain Patient left: in bed;with call bell/phone within reach Nurse Communication: Mobility status         Time: 1350-1420 PT Time Calculation (min) (ACUTE ONLY): 30 min   Charges:   PT Evaluation $PT Eval Moderate Complexity: 1 Procedure PT Treatments $Gait Training: 8-22 mins   PT G CodesAlessandra Bevels Ruairi Stutsman 12/11/2015, 3:03 PM Deborah Chalk, PT, DPT [phone]

## 2015-12-11 NOTE — Progress Notes (Signed)
Pt severely agitated and anxious for past hour. Despite medication administration and therapeutic discussion, pt insists that pain has not "decreased even a point in 5 hours" or since her surgery yesterday afternoon. Pt however has been inconsistent with this RN about her pain level and has frequently asked "what's next?" in reference to what additional pain medications she can have despite being just medicated. This RN has explain numerous times that we administer the medications as ordered and if they are not effective after giving them sufficient time to absorb and take effect, then we call the PA or MD on call. At one point pt became agitated, jumped out of bed and call MD answering service herself in the presence of this RN, just after receiving analgesic. In addition to ordered PRNs, pt requesting a larger dose of Dilaudid, Morphine, Meloxicam and Prednisone. Pt has been argumentative with this RN for hours and at this time charge RN with now become pt's primary RN. Pt stable and is currently walking around the hall, anxious and crying. Report given to Winfield Rast, RN who will now be managing pt's care and she has also explained this to the pt.

## 2015-12-11 NOTE — Progress Notes (Addendum)
MD was notified by this RN about her c/o severe pain despite all pain medicines have been given to her accordingly. Dr.Duda called back and ordered morphine 2 mg IV every 2 hours for severe pain. Pt was given morphine 2 mg IV for her pain of 10/10 and pt verbalized some relief. Pt resting comfortably in bed as of now. Nursing will continue to monitor.

## 2015-12-11 NOTE — Progress Notes (Signed)
Patient ID: Kara [name], female   DOB: [birth year], 60 y.o.   MRN: [id number] Patient is a 60 year old woman who is postoperative day 1 lumbar spine decompression. Patient's pain medication was changed from Dilaudid to morphine and she states this is providing her much better pain relief. Patient is discharged to home on Sunday or Monday depending on how patient does with therapy.

## 2015-12-12 MED ORDER — MORPHINE SULFATE ER 15 MG PO TBCR: 15.0000 mg | EXTENDED_RELEASE_TABLET | Freq: Two times a day (BID) | ORAL | 0 refills | Status: DC | PRN

## 2015-12-12 MED ORDER — OXYCODONE-ACETAMINOPHEN 10-325 MG PO TABS: 1.0000 | ORAL_TABLET | Freq: Four times a day (QID) | ORAL | 0 refills | Status: DC | PRN

## 2015-12-12 MED ORDER — CYCLOBENZAPRINE HCL 10 MG PO TABS: 10.0000 mg | ORAL_TABLET | Freq: Three times a day (TID) | ORAL | 0 refills | Status: DC | PRN

## 2015-12-12 NOTE — Care Management Note (Signed)
Case Management Note  Patient Details  Name: Kara [name] MRN: [id number] Date of Birth: 04-Mar-1956  Subjective/Objective:                  Right L3-4 Hemilaminectomy, Excision of herniated nucleus pulposus (N/A) Action/Plan: Discharge planning Expected Discharge Date:  12/12/15              Expected Discharge Plan:  Home/Self Care  In-House Referral:     Discharge planning Services  CM Consult  Post Acute Care Choice:  NA Choice offered to:  NA  DME Arranged:  N/A DME Agency:  NA  HH Arranged:  NA HH Agency:  NA  Status of Service:  Completed, signed off  If discussed at Long Length of Stay Meetings, dates discussed:    Additional Comments: CM notes and confirms with with RN pt has no HH recc or orders.  No other CM needs were communicated.  Yves Dill, RN 12/12/2015, 9:30 AM

## 2015-12-12 NOTE — Progress Notes (Signed)
Patient ID: Kara [name], female   DOB: December 05, 1955, 60 y.o.   MRN: [id number] Patient feels like her pain is improving but she states she still has a pain that 7-8 out of 10. Patient states that she has chronic pain and has just left her pain clinic. Patient requests discharged today and states she needs the Percocet and cannot be discharged without morphine sulfate. He states that this is a medicine that she's been on chronically through her pain clinic. Orders were written for Percocet Flexeril and morphine sulfate as requested. Plan to follow-up with Dr. Ophelia Charter in 1 week.

## 2015-12-13 ENCOUNTER — Emergency Department (HOSPITAL_COMMUNITY): Payer: Managed Care, Other (non HMO)

## 2015-12-13 ENCOUNTER — Observation Stay (HOSPITAL_COMMUNITY)
Admission: EM | Admit: 2015-12-13 | Discharge: 2015-12-14 | Disposition: A | Discharge status: Home / Self Care | Payer: Managed Care, Other (non HMO) | Attending: Internal Medicine | Admitting: Internal Medicine

## 2015-12-13 ENCOUNTER — Encounter (HOSPITAL_COMMUNITY): Payer: Self-pay

## 2015-12-13 DIAGNOSIS — M810 Age-related osteoporosis without current pathological fracture: Secondary | ICD-10-CM | POA: Insufficient documentation

## 2015-12-13 DIAGNOSIS — F4310 Post-traumatic stress disorder, unspecified: Secondary | ICD-10-CM | POA: Insufficient documentation

## 2015-12-13 DIAGNOSIS — M069 Rheumatoid arthritis, unspecified: Secondary | ICD-10-CM | POA: Diagnosis not present

## 2015-12-13 DIAGNOSIS — Z87442 Personal history of urinary calculi: Secondary | ICD-10-CM | POA: Diagnosis not present

## 2015-12-13 DIAGNOSIS — R0902 Hypoxemia: Secondary | ICD-10-CM | POA: Diagnosis not present

## 2015-12-13 DIAGNOSIS — R4182 Altered mental status, unspecified: Secondary | ICD-10-CM

## 2015-12-13 DIAGNOSIS — Z803 Family history of malignant neoplasm of breast: Secondary | ICD-10-CM | POA: Insufficient documentation

## 2015-12-13 DIAGNOSIS — F332 Major depressive disorder, recurrent severe without psychotic features: Secondary | ICD-10-CM | POA: Diagnosis not present

## 2015-12-13 DIAGNOSIS — M797 Fibromyalgia: Secondary | ICD-10-CM | POA: Diagnosis not present

## 2015-12-13 DIAGNOSIS — Z8249 Family history of ischemic heart disease and other diseases of the circulatory system: Secondary | ICD-10-CM | POA: Insufficient documentation

## 2015-12-13 DIAGNOSIS — Z87891 Personal history of nicotine dependence: Secondary | ICD-10-CM | POA: Diagnosis not present

## 2015-12-13 DIAGNOSIS — Z9889 Other specified postprocedural states: Secondary | ICD-10-CM | POA: Insufficient documentation

## 2015-12-13 DIAGNOSIS — G9340 Encephalopathy, unspecified: Principal | ICD-10-CM | POA: Insufficient documentation

## 2015-12-13 DIAGNOSIS — R5382 Chronic fatigue, unspecified: Secondary | ICD-10-CM | POA: Diagnosis not present

## 2015-12-13 DIAGNOSIS — F988 Other specified behavioral and emotional disorders with onset usually occurring in childhood and adolescence: Secondary | ICD-10-CM | POA: Diagnosis not present

## 2015-12-13 DIAGNOSIS — J8410 Pulmonary fibrosis, unspecified: Secondary | ICD-10-CM | POA: Insufficient documentation

## 2015-12-13 DIAGNOSIS — G8929 Other chronic pain: Secondary | ICD-10-CM | POA: Insufficient documentation

## 2015-12-13 DIAGNOSIS — R7303 Prediabetes: Secondary | ICD-10-CM | POA: Insufficient documentation

## 2015-12-13 DIAGNOSIS — Z981 Arthrodesis status: Secondary | ICD-10-CM | POA: Diagnosis not present

## 2015-12-13 LAB — BASIC METABOLIC PANEL
Anion gap: 7 (ref 5–15)
BUN: 11 mg/dL (ref 6–20)
CALCIUM: 8.8 mg/dL — AB (ref 8.9–10.3)
CHLORIDE: 105 mmol/L (ref 101–111)
CO2: 24 mmol/L (ref 22–32)
CREATININE: 0.68 mg/dL (ref 0.44–1.00)
Glucose, Bld: 82 mg/dL (ref 65–99)
Potassium: 4.4 mmol/L (ref 3.5–5.1)
SODIUM: 136 mmol/L (ref 135–145)

## 2015-12-13 LAB — HEPATIC FUNCTION PANEL
ALBUMIN: 2.8 g/dL — AB (ref 3.5–5.0)
ALK PHOS: 74 U/L (ref 38–126)
ALT: 16 U/L (ref 14–54)
AST: 25 U/L (ref 15–41)
BILIRUBIN TOTAL: 0.4 mg/dL (ref 0.3–1.2)
Bilirubin, Direct: <0.1 mg/dL — ABNORMAL LOW (ref 0.1–0.5)
Total Protein: 5.4 g/dL — ABNORMAL LOW (ref 6.5–8.1)

## 2015-12-13 LAB — URINALYSIS, ROUTINE W REFLEX MICROSCOPIC
BILIRUBIN URINE: NEGATIVE
GLUCOSE, UA: NEGATIVE mg/dL
HGB URINE DIPSTICK: NEGATIVE
Ketones, ur: NEGATIVE mg/dL
Leukocytes, UA: NEGATIVE
Nitrite: NEGATIVE
PROTEIN: NEGATIVE mg/dL
Specific Gravity, Urine: 1.023 (ref 1.005–1.030)
pH: 5.5 (ref 5.0–8.0)

## 2015-12-13 LAB — AMMONIA: Ammonia: 21 umol/L (ref 9–35)

## 2015-12-13 LAB — CBC WITH DIFFERENTIAL/PLATELET
BASOS PCT: 0 %
Basophils Absolute: 0 10*3/uL (ref 0.0–0.1)
EOS ABS: 0.2 10*3/uL (ref 0.0–0.7)
EOS PCT: 2 %
HCT: 35.2 % — ABNORMAL LOW (ref 36.0–46.0)
HEMOGLOBIN: 11.3 g/dL — AB (ref 12.0–15.0)
LYMPHS ABS: 1.8 10*3/uL (ref 0.7–4.0)
Lymphocytes Relative: 17 %
MCH: 28.5 pg (ref 26.0–34.0)
MCHC: 32.1 g/dL (ref 30.0–36.0)
MCV: 88.9 fL (ref 78.0–100.0)
MONO ABS: 1 10*3/uL (ref 0.1–1.0)
MONOS PCT: 9 %
NEUTROS PCT: 72 %
Neutro Abs: 7.9 10*3/uL — ABNORMAL HIGH (ref 1.7–7.7)
PLATELETS: 281 10*3/uL (ref 150–400)
RBC: 3.96 MIL/uL (ref 3.87–5.11)
RDW: 13.2 % (ref 11.5–15.5)
WBC: 10.8 10*3/uL — ABNORMAL HIGH (ref 4.0–10.5)

## 2015-12-13 LAB — RAPID URINE DRUG SCREEN, HOSP PERFORMED
AMPHETAMINES: POSITIVE — AB
Barbiturates: NOT DETECTED
Benzodiazepines: POSITIVE — AB
Cocaine: NOT DETECTED
OPIATES: POSITIVE — AB
Tetrahydrocannabinol: POSITIVE — AB

## 2015-12-13 LAB — MAGNESIUM: MAGNESIUM: 1.8 mg/dL (ref 1.7–2.4)

## 2015-12-13 LAB — CBG MONITORING, ED: Glucose-Capillary: 164 mg/dL — ABNORMAL HIGH (ref 65–99)

## 2015-12-13 LAB — I-STAT CG4 LACTIC ACID, ED: LACTIC ACID, VENOUS: 1.38 mmol/L (ref 0.5–1.9)

## 2015-12-13 MED ORDER — SODIUM CHLORIDE 0.9 % IV BOLUS (SEPSIS): 1000.0000 mL | Freq: Once | INTRAVENOUS | Status: DC

## 2015-12-13 MED ORDER — SODIUM CHLORIDE 0.9 % IV BOLUS (SEPSIS)
500.0000 mL | Freq: Once | INTRAVENOUS | Status: AC
  Administered 2015-12-13: 500 mL via INTRAVENOUS

## 2015-12-13 MED ORDER — IOPAMIDOL (ISOVUE-370) INJECTION 76%
INTRAVENOUS | Status: AC
  Administered 2015-12-13: 100 mL
  Filled 2015-12-13: qty 100

## 2015-12-13 NOTE — H&P (Signed)
History and Physical    Kara [name] BJS:[id number] DOB: [birth year] DOA: 12/13/2015  PCP: Felix Pacini, DO   Patient coming from: Home.  Chief Complaint: AMS.  HPI: Kara [name] is a 60 y.o. female with medical history significant of ADD, Allergy, anxiety, depression, PTSD, chronic fatigue syndrome, fibromyalgia, nephrolithiasis, Rheumatoid arthritis, prediabetes who underwent a lumbar laminectomy/decompression on Friday 12/10/2015 and is brought to the ER today after her husband found her lying on her face with AMS at home this morning.  Per patient's husband, she was recently started on MS contin 15 mg po BID after surgery. She also takes percocet, Flexeril and Robaxin. He is states that she has had trouble in the past controlling the use of her medications, so he got out of her MS Contin, Percocet Flexeril and Robaxin for her this morning. She apparently took them all together this morning. She also has a prescription for alprazolam 1 mg for anxiety. The patient's husband states that he went home and counted all the pills and there are no missing.   The patient was initially able to be awakened only for a few seconds and then would go back to sleep. Now, she is conversing and close to baseline per her husband. She is in no acute distress.  ED Course: Hemoglobin was 11.3 g/dL, chest X-ray showed low volume atelectasis. CT chest angiogram showed atelectasis, calcified granuloma with calcified and mediastinal/hilar lymph nodes, mod carotids seen from vascular calcification, mild right maxillary sinus mucosal thickening. She received supplemental oxygen and a normal saline 500 mL bolus.  Review of Systems: As per HPI otherwise 10 point review of systems negative.     Past Medical History:  Diagnosis Date  . ADD (attention deficit disorder)    on Adderal  . Aggressive behavior of adult   . Allergy   . Anxiety   . Arthritis   . Asthma    as a child  . Cellulitis of breast 11/2013   RIGHT  BREAST  . Chronic fatigue and immune dysfunction syndrome   . Chronic kidney disease    due to infection from stone- no regular visits- fine now  . Chronic pain    goes to [facility] for pain control  . Cold sore   . 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 Depression    takes meds daily  . Family history of malignant neoplasm of breast   . Fibromyalgia   . Nephrolithiasis   . Osteoporosis   . Other specified rheumatoid arthritis, right shoulder (HCC) 08/01/2011  . Post-nasal drip    hx of  . Rheumatoid arthritis(714.0)    autoimmune arthritis; methotrexate once/week  . Urinary incontinence     Past Surgical History:  Procedure Laterality Date  . ANTERIOR CERVICAL DECOMP/DISCECTOMY FUSION N/A 03/15/2015   Procedure: Cervical five-six, Cerival six-seven, Anterior Cervical Discectomy and Fusion, Allograft and Plate;  Surgeon: Eldred Manges, MD;  Location: [facility] OR;  Service: Orthopedics;  Laterality: N/A;  . BACK SURGERY  2011,16   lower back fusion l4-5, s1  . BLADDER SURGERY  2009  . BREAST ENHANCEMENT SURGERY  2003  . breast implants removed    . BREAST SURGERY  10/2013   REMOVAL OF BREAST IMPLANTS  . INCISION AND DRAINAGE ABSCESS Right 01/16/2014   Procedure: INCISION AND DRAINAGE AND OF RIGHT BREAST ABCESS;  Surgeon: Glenna Fellows, MD;  Location: [facility] ORS;  Service: General;  Laterality: Right;  .  LIPOSUCTION  2003   abdominal  . LUMBAR LAMINECTOMY/DECOMPRESSION MICRODISCECTOMY N/A 12/10/2015   Procedure: Right L3-4 Hemilaminectomy, Excision of herniated nucleus pulposus;  Surgeon: Eldred Manges, MD;  Location: [facility] OR;  Service: Orthopedics;  Laterality: N/A;  . MASS EXCISION  11/03/2011   Procedure: MINOR EXCISION OF MASS;  Surgeon: Wyn Forster., MD;  Location: [facility];  Service: Orthopedics;  Laterality: Left;  debride IP joint, cyst excision left index  . SHOULDER SURGERY  2012   right  . TONSILLECTOMY   1987  . TOTAL SHOULDER ARTHROPLASTY  08/01/2011   Procedure: TOTAL SHOULDER ARTHROPLASTY;  Surgeon: Eulas Post, MD;  Location: [facility] OR;  Service: Orthopedics;  Laterality: Right;  Right total shoulder arthroplasty  . TUBAL LIGATION  1988     reports that she quit smoking about 10 years ago. Her smoking use included Cigarettes. She has a 5.00 pack-year smoking history. She has never used smokeless tobacco. She reports that she does not drink alcohol or use drugs.  Allergies  Allergen Reactions  . Aspirin Anaphylaxis  . Ibuprofen Anaphylaxis  . Ativan [Lorazepam] Other (See Comments)    Makes agitated, combative   . Ketamine Other (See Comments)    Hallucinations  . Toradol [Ketorolac Tromethamine] Itching, Swelling and Rash    UNSPECIFIED REACTION   . Tramadol Hcl Itching, Swelling and Rash    SWELLING REACTION UNSPECIFIED     Family History  Problem Relation Age of Onset  . Arthritis Mother   . Heart disease Mother     ?psvt  . Breast cancer Mother 30    TAH/BSO  . Cancer Mother   . Mental illness Mother   . COPD Father   . Hypertension Father   . Alcohol abuse Father   . Mental illness Father   . Heart disease Father   . Healthy Daughter   . Breast cancer Maternal Aunt 27    deceased  . Cancer Cousin 51    female; unknown primary  . Colon cancer Paternal Aunt 28    deceased at 65  . Stomach cancer Paternal Uncle 38    deceased at 22  . Alcohol abuse Brother   . Cancer Brother   . Hodgkin's lymphoma Brother   . Mental illness Brother   . HIV Brother   . Healthy Brother   . Healthy Son      Prior to Admission medications   Medication Sig Start Date End Date Taking? Authorizing Provider  ADDERALL XR 30 MG 24 hr capsule Take 30 mg by mouth 2 (two) times daily.  11/02/15  Yes Historical Provider, MD  ALPRAZolam Prudy Feeler) 1 MG tablet Take 1 mg by mouth 2 (two) times daily as needed for anxiety.  07/06/14  Yes Historical Provider, MD  cyclobenzaprine (FLEXERIL) 10 MG  tablet Take 1 tablet (10 mg total) by mouth 3 (three) times daily as needed for muscle spasms. 12/12/15  Yes Nadara Mustard, MD  meloxicam (MOBIC) 15 MG tablet Take 15 mg by mouth daily. 11/22/15  Yes Historical Provider, MD  methocarbamol (ROBAXIN) 500 MG tablet Take 500 mg by mouth 2 (two) times daily as needed. 12/12/15  Yes Historical Provider, MD  morphine (MS CONTIN) 15 MG 12 hr tablet Take 1 tablet (15 mg total) by mouth every 12 (twelve) hours as needed for pain. 12/12/15  Yes Nadara Mustard, MD  oxyCODONE-acetaminophen (PERCOCET) 10-325 MG tablet Take 1-2 tablets by mouth every 6 (six) hours as needed  for pain. 12/12/15  Yes Nadara Mustard, MD  tretinoin (RETIN-A) 0.1 % cream Apply 1 application topically at bedtime. 90 Day Supply 08/18/15  Yes Renee A Kuneff, DO  venlafaxine XR (EFFEXOR-XR) 75 MG 24 hr capsule Take 75 mg by mouth daily with breakfast.   Yes Historical Provider, MD  zolpidem (AMBIEN) 10 MG tablet TAKE 1 TABLET (10 mg) BY MOUTH AT BEDTIME AS NEEDED for sleep 05/23/15  Yes Historical Provider, MD  venlafaxine XR (EFFEXOR-XR) 37.5 MG 24 hr capsule Take 37.5 mg by mouth every morning. 12/02/15   Historical Provider, MD    Physical Exam:  Constitutional: Sleeping, but easily awakened. NAD. Vitals:   12/13/15 1921 12/13/15 1930 12/13/15 1945 12/13/15 1949  BP:  109/71 107/58   Pulse: 70 69 72 72  Resp:  11 15 19   Temp:      TempSrc:      SpO2: 99% 99%  98%  Weight:      Height:       Eyes: PERRL, lids and conjunctivae normal ENMT: Mucous membranes are moist. Posterior pharynx clear of any exudate or lesions. Neck: normal, supple, no masses, no thyromegaly Respiratory: clear to auscultation bilaterally, no wheezing, no crackles. No accessory muscle use.  Cardiovascular: Regular rate and rhythm, no murmurs / rubs / gallops. No extremity edema. 2+ pedal pulses. No carotid bruits.  Abdomen: no tenderness, no masses palpated. No hepatosplenomegaly. Bowel sounds positive.    Musculoskeletal: no clubbing / cyanosis. No joint deformity upper and lower extremities. Good ROM, no contractures. Normal muscle tone.  Skin: no rashes, lesions, ulcers on limited skin exam. Surgical incision on L-S area which seems to be healing uneventfully. Neurologic: CN 2-12 grossly intact. Sensation intact, DTR normal. Strength 5/5 in all 4.  Psychiatric: Alert and oriented x 3, partially oriented to time.      Labs on Admission: I have personally reviewed following labs and imaging studies  CBC:  Recent Labs Lab 12/07/15 1329 12/13/15 1622  WBC 11.6* 10.8*  NEUTROABS  --  7.9*  HGB 13.7 11.3*  HCT 43.3 35.2*  MCV 88.2 88.9  PLT 416* 281   Basic Metabolic Panel:  Recent Labs Lab 12/07/15 1329 12/13/15 1622  NA 139 136  K 3.8 4.4  CL 102 105  CO2 27 24  GLUCOSE 93 82  BUN 14 11  CREATININE 0.73 0.68  CALCIUM 9.8 8.8*   GFR: Estimated Creatinine Clearance (by C-G formula based on SCr of 0.68 mg/dL) Female: 12/15/15 mL/min Female: 89.7 mL/min Liver Function Tests:  Recent Labs Lab 12/07/15 1329  AST 18  ALT 17  ALKPHOS 90  BILITOT 0.7  PROT 7.5  ALBUMIN 3.9   No results for input(s): LIPASE, AMYLASE in the last 168 hours. No results for input(s): AMMONIA in the last 168 hours. Coagulation Profile:  Recent Labs Lab 12/07/15 1329  INR 1.00   Cardiac Enzymes: No results for input(s): CKTOTAL, CKMB, CKMBINDEX, TROPONINI in the last 168 hours. BNP (last 3 results) No results for input(s): PROBNP in the last 8760 hours. HbA1C: No results for input(s): HGBA1C in the last 72 hours. CBG:  Recent Labs Lab 12/13/15 1400  GLUCAP 164*   Lipid Profile: No results for input(s): CHOL, HDL, LDLCALC, TRIG, CHOLHDL, LDLDIRECT in the last 72 hours. Thyroid Function Tests: No results for input(s): TSH, T4TOTAL, FREET4, T3FREE, THYROIDAB in the last 72 hours. Anemia Panel: No results for input(s): VITAMINB12, FOLATE, FERRITIN, TIBC, IRON, RETICCTPCT in the  last 72 hours.  Urine analysis:    Component Value Date/Time   COLORURINE YELLOW 12/13/2015 1630   APPEARANCEUR CLEAR 12/13/2015 1630   LABSPEC 1.023 12/13/2015 1630   PHURINE 5.5 12/13/2015 1630   GLUCOSEU NEGATIVE 12/13/2015 1630   GLUCOSEU NEGATIVE 08/20/2009 1324   HGBUR NEGATIVE 12/13/2015 1630   BILIRUBINUR NEGATIVE 12/13/2015 1630   BILIRUBINUR neg 08/04/2013 1520   KETONESUR NEGATIVE 12/13/2015 1630   PROTEINUR NEGATIVE 12/13/2015 1630   UROBILINOGEN 0.2 06/26/2014 1553   NITRITE NEGATIVE 12/13/2015 1630   LEUKOCYTESUR NEGATIVE 12/13/2015 1630    Recent Results (from the past 240 hour(s))  Surgical pcr screen     Status: Abnormal   Collection Time: 12/07/15  1:32 PM  Result Value Ref Range Status   MRSA, PCR NEGATIVE NEGATIVE Final   Staphylococcus aureus POSITIVE (A) NEGATIVE Final    Comment:        The Xpert SA Assay (FDA approved for NASAL specimens in patients over 59 years of age), is one component of a comprehensive surveillance program.  Test performance has been validated by [facility] for patients greater than or equal to 59 year old. It is not intended to diagnose infection nor to guide or monitor treatment.      Radiological Exams on Admission: Dg Chest 2 View  Result Date: 12/13/2015 CLINICAL DATA:  Spinal surgery 3 days ago. Worsening lethargy following discharge. EXAM: CHEST  2 VIEW COMPARISON:  12/07/2015.  06/26/2014. FINDINGS: Heart size is normal. Mediastinal shadows are normal. There is mild atelectasis in both lower lobes. Chronic calcified granuloma at the left lung base as seen previously. No evidence of heart failure or effusion. IMPRESSION: Mild atelectasis in both lower lobes. No dense consolidation or lobar collapse. Electronically Signed   By: Paulina FusiMark  Shogry M.D.   On: 12/13/2015 16:17   Ct Head Wo Contrast  Result Date: 12/13/2015 CLINICAL DATA:  Lethargy/altered mental status EXAM: CT HEAD WITHOUT CONTRAST TECHNIQUE: Contiguous axial  images were obtained from the base of the skull through the vertex without intravenous contrast. COMPARISON:  None. FINDINGS: Brain: The ventricles are normal in size and configuration. There is no intracranial mass, hemorrhage, extra-axial fluid collection, or midline shift. Gray-white compartments appear normal. No acute infarct evident. Vascular: There is mild calcification in the carotid siphon regions bilaterally. There is no hyperdense vessel. Skull: The bony calvarium appears intact. Sinuses/Orbits: There is mild mucosal thickening in the right maxillary antrum. Other paranasal sinuses are clear. Orbits appear symmetric bilaterally. Other: Mastoid air cells are clear. IMPRESSION: No intracranial mass, hemorrhage, or extra-axial fluid collection. Gray-white compartments appear normal. Mild carotid siphon vascular calcification. Mild right maxillary sinus mucosal thickening. Electronically Signed   By: Bretta BangWilliam  Woodruff III M.D.   On: 12/13/2015 16:01   Ct Angio Chest Pe W Or Wo Contrast  Result Date: 12/13/2015 CLINICAL DATA:  Hypoxia.  Recent surgery. EXAM: CT ANGIOGRAPHY CHEST WITH CONTRAST TECHNIQUE: Multidetector CT imaging of the chest was performed using the standard protocol during bolus administration of intravenous contrast. Multiplanar CT image reconstructions and MIPs were obtained to evaluate the vascular anatomy. CONTRAST:  100 cc Isovue 370 intravenous COMPARISON:  None. FINDINGS: Cardiovascular: Normal heart size. No pericardial effusion. Study optimized for evaluating the pulmonary arteries. There is intermittent respiratory motion but overall diagnostic exam. No evidence of pulmonary embolism. Negative thoracic aorta. Mediastinum: Remote granulomatous disease with calcified mediastinal and left hilar lymph nodes. Lungs/Pleura: Low volume chest with subsegmental atelectasis. There is no edema, consolidation, effusion, or pneumothorax. Calcified granuloma in  the left lung. Upper abdomen: No  acute findings. Musculoskeletal: No acute or aggressive finding. Probable reduction mammoplasty bilaterally. Fatty atrophy of right sub scapularis compatible with chronic rotator cuff tear. Right glenohumeral arthroplasty. Review of the MIP images confirms the above findings. IMPRESSION: 1. No evidence of pulmonary embolism. 2. Low volume chest with subsegmental atelectasis. Electronically Signed   By: Marnee Spring M.D.   On: 12/13/2015 19:20   Echocardiogram 12/03/2015 ------------------------------------------------------------------- LV EF: 60% -   65%  ------------------------------------------------------------------- Indications:      Chest pain 786.51.  ------------------------------------------------------------------- History:   PMH:  Chronic Fatigue Syndrome. Chronic Kidney Disease. Risk factors:  Former Smoker. Family history of coronary artery disease.  ------------------------------------------------------------------- Study Conclusions  - Left ventricle: The cavity size was normal. Wall thickness was   normal. Systolic function was normal. The estimated ejection   fraction was in the range of 60% to 65%. Wall motion was normal;   there were no regional wall motion abnormalities. Doppler   parameters are consistent with abnormal left ventricular   relaxation (grade 1 diastolic dysfunction).  EKG: Independently reviewed.   Assessment/Plan Principal Problem:   Altered mental status Likely secondary to medications. Admit to telemetry/observation. Frequent neuro checks. Gentle IV hydration. Oxycodone for pain only at the moment. Needs to alternate her medications to avoid interactions.  Active Problems:     Severe recurrent major depression without psychotic features (HCC) No of significant symptoms of depression at this time. Continue Effexor 75 mg by mouth daily.    PTSD (post-traumatic stress disorder) Stable at this time. Will hold alprazolam Alprazolam  for now.      S/P lumbar fusion   Chronic pain   Fibromyalgia Hold MS Contin and muscle relaxants for now. Percocet 12/28/2023 every 8 hours as needed for pain.    Prediabetes Carbohydrate diet. Check hemoglobin A1c. Monitor CBG before meals.    Rheumatoid arthritis (HCC) The patient is apparently able to tolerate meloxicam without allergic reactions. May resume in a.m.   DVT prophylaxis: SCDs. Code Status: Full code. Family Communication: Her husband Jamilia Jacques was present in the room and provided history. Disposition Plan: Admit for overnight observation. Consults called:  Admission status: Observation/Telemetry.   Bobette Mo MD [facility] Pager [phone].  If 7PM-7AM, please contact night-coverage [email or web address] Password [email or web address]  12/13/2015, 8:21 PM

## 2015-12-13 NOTE — ED Notes (Signed)
Pt appears lethargic charge nurse made aware pt brought back pt's SPO2 increased to 96% on [place] 4L

## 2015-12-13 NOTE — ED Notes (Signed)
Patient transported to CT 

## 2015-12-13 NOTE — ED Triage Notes (Signed)
Pt recently had back surgery this past Friday and was discharged pt has been becoming increasingly lethargic and has swelling noted to the R hand and face that as per husband started today

## 2015-12-13 NOTE — ED Provider Notes (Signed)
[facility]-EMERGENCY DEPT Provider Note   CSN: [id number] Arrival date & time: 12/13/15  1259     History   Chief Complaint Chief Complaint  Patient presents with  . Fatigue    pt was recently discharged from the hospital for back surgery as per husband pt has been becoming increasingly lethargic     HPI Kara [name] is a 60 y.o. female.  Patient presents to the ED with a chief complaint of altered mental status.  She had a recent lumbar laminectomy/decomopression on Friday.  She presents with her husband today, who found her confused and altered this morning when she awoke.  He states that when she went to bed last night she was acting normal. He states that he gave her her typical medication.  He dispenses her medications and keeps them in his room.  He states that she did not take any additional pain medicine.  He states that she said she fell last night sometime.  Patient reports having had a cough.  There are no other associated symptoms.  She denies any numbness, weakness, or tingling in her extremities.  There are no modifying factors.   The history is provided by the spouse and the patient. No language interpreter was used.    Past Medical History:  Diagnosis Date  . ADD (attention deficit disorder)    on Adderal  . Aggressive behavior of adult   . Allergy   . Anxiety   . Arthritis   . Asthma    as a child  . Cellulitis of breast 11/2013   RIGHT BREAST  . Chronic fatigue and immune dysfunction syndrome   . Chronic kidney disease    due to infection from stone- no regular visits- fine now  . Chronic pain    goes to [facility] for pain control  . Cold sore   . 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 Depression    takes meds daily  . Family history of malignant neoplasm of breast   . Fibromyalgia   . Nephrolithiasis   . Osteoporosis   . Other specified rheumatoid arthritis, right shoulder (HCC) 08/01/2011  . Post-nasal  drip    hx of  . Rheumatoid arthritis(714.0)    autoimmune arthritis; methotrexate once/week  . Urinary incontinence     Patient Active Problem List   Diagnosis Date Noted  . HNP (herniated nucleus pulposus), lumbar 12/10/2015  . Prediabetes 08/19/2015  . BMI 26.0-26.9,adult 08/18/2015  . Chronic pain 08/18/2015  . Vitamin D deficiency 08/18/2015  . Atypical chest pain 08/18/2015  . Elevated BP 08/18/2015  . Chronic fatigue disorder 06/14/2015  . Nail fungus 06/14/2015  . S/P cervical spinal fusion 03/15/2015  . S/P lumbar fusion 07/03/2014  . PTSD (post-traumatic stress disorder) 03/07/2014  . Severe recurrent major depression without psychotic features (HCC) 03/06/2014  . Suicide threat or attempt 03/06/2014  . Agitation 03/06/2014  . Aggressive behavior   . Family history of malignant neoplasm of breast   . Rheumatoid arthritis (HCC) 12/13/2011  . Fibromyalgia 12/13/2011    Past Surgical History:  Procedure Laterality Date  . ANTERIOR CERVICAL DECOMP/DISCECTOMY FUSION N/A 03/15/2015   Procedure: Cervical five-six, Cerival six-seven, Anterior Cervical Discectomy and Fusion, Allograft and Plate;  Surgeon: Eldred Manges, MD;  Location: [facility] OR;  Service: Orthopedics;  Laterality: N/A;  . BACK SURGERY  2011,16   lower back fusion l4-5, s1  . BLADDER SURGERY  2009  .  BREAST ENHANCEMENT SURGERY  2003  . breast implants removed    . BREAST SURGERY  10/2013   REMOVAL OF BREAST IMPLANTS  . INCISION AND DRAINAGE ABSCESS Right 01/16/2014   Procedure: INCISION AND DRAINAGE AND OF RIGHT BREAST ABCESS;  Surgeon: Glenna Fellows, MD;  Location: [facility];  Service: General;  Laterality: Right;  . LIPOSUCTION  2003   abdominal  . LUMBAR LAMINECTOMY/DECOMPRESSION MICRODISCECTOMY N/A 12/10/2015   Procedure: Right L3-4 Hemilaminectomy, Excision of herniated nucleus pulposus;  Surgeon: Eldred Manges, MD;  Location: [facility];  Service: Orthopedics;  Laterality: N/A;  . MASS EXCISION  11/03/2011    Procedure: MINOR EXCISION OF MASS;  Surgeon: Wyn Forster., MD;  Location: [facility];  Service: Orthopedics;  Laterality: Left;  debride IP joint, cyst excision left index  . SHOULDER SURGERY  2012   right  . TONSILLECTOMY  1987  . TOTAL SHOULDER ARTHROPLASTY  08/01/2011   Procedure: TOTAL SHOULDER ARTHROPLASTY;  Surgeon: Eulas Post, MD;  Location: [facility] OR;  Service: Orthopedics;  Laterality: Right;  Right total shoulder arthroplasty  . TUBAL LIGATION  1988    OB History    No data available       Home Medications    Prior to Admission medications   Medication Sig Start Date End Date Taking? Authorizing Provider  ADDERALL XR 30 MG 24 hr capsule Take 30 mg by mouth 2 (two) times daily. Pt takes at 0800 and at [facility] 11/02/15   Historical Provider, MD  ALPRAZolam Prudy Feeler) 1 MG tablet Take 1 mg by mouth 2 (two) times daily as needed for anxiety.  07/06/14   Historical Provider, MD  cyclobenzaprine (FLEXERIL) 10 MG tablet Take 1 tablet (10 mg total) by mouth 3 (three) times daily as needed for muscle spasms. 12/12/15   Nadara Mustard, MD  morphine (MS CONTIN) 15 MG 12 hr tablet Take 1 tablet (15 mg total) by mouth every 12 (twelve) hours as needed for pain. 12/12/15   Nadara Mustard, MD  oxyCODONE-acetaminophen (PERCOCET) 10-325 MG tablet Take 1-2 tablets by mouth every 6 (six) hours as needed for pain. 12/12/15   Nadara Mustard, MD  tretinoin (RETIN-A) 0.1 % cream Apply 1 application topically at bedtime. 90 Day Supply 08/18/15   Renee A Kuneff, DO  venlafaxine XR (EFFEXOR-XR) 37.5 MG 24 hr capsule Take 37.5 mg by mouth every morning. 12/02/15   Historical Provider, MD  zolpidem (AMBIEN) 10 MG tablet TAKE 1 TABLET (10 mg) BY MOUTH AT BEDTIME AS NEEDED for sleep 05/23/15   Historical Provider, MD    Family History Family History  Problem Relation Age of Onset  . Arthritis Mother   . Heart disease Mother     ?psvt  . Breast cancer Mother 34    TAH/BSO  . Cancer Mother   .  Mental illness Mother   . COPD Father   . Hypertension Father   . Alcohol abuse Father   . Mental illness Father   . Heart disease Father   . Healthy Daughter   . Breast cancer Maternal Aunt 27    deceased  . Cancer Cousin 44    female; unknown primary  . Colon cancer Paternal Aunt 66    deceased at 22  . Stomach cancer Paternal Uncle 89    deceased at 24  . Alcohol abuse Brother   . Cancer Brother   . Hodgkin's lymphoma Brother   . Mental illness Brother   .  HIV Brother   . Healthy Brother   . Healthy Son     Social History Social History  Substance Use Topics  . Smoking status: Former Smoker    Packs/day: 0.50    Years: 10.00    Types: Cigarettes    Quit date: 12/06/2005  . Smokeless tobacco: Never Used     Comment: smoked for 10 years  . Alcohol use No     Comment: denied alcohol     Allergies   Aspirin; Ibuprofen; Ativan [lorazepam]; Ketamine; Toradol [ketorolac tromethamine]; and Tramadol hcl   Review of Systems Review of Systems  Constitutional: Positive for activity change and fatigue. Negative for chills and fever.  Respiratory: Positive for cough.   Neurological: Negative for weakness and numbness.  All other systems reviewed and are negative.    Physical Exam Updated Vital Signs BP 106/84   Pulse 72   Temp 98.5 F (36.9 C) (Oral)   Resp 12   Ht 5\' 6"  (1.676 m)   Wt 68 kg   SpO2 99%   BMI 24.21 kg/m   Physical Exam  Constitutional: She is oriented to person, place, and time. She appears well-developed and well-nourished.  HENT:  Head: Normocephalic and atraumatic.  Eyes: Conjunctivae and EOM are normal. Pupils are equal, round, and reactive to light.  Neck: Normal range of motion. Neck supple.  Cardiovascular: Normal rate and regular rhythm.  Exam reveals no gallop and no friction rub.   No murmur heard. Pulmonary/Chest: Effort normal and breath sounds normal. No respiratory distress. She has no wheezes. She has no rales. She exhibits  no tenderness.  CTAB  Abdominal: Soft. Bowel sounds are normal. She exhibits no distension and no mass. There is no tenderness. There is no rebound and no guarding.  Musculoskeletal: Normal range of motion. She exhibits no edema or tenderness.  Moves all extremities  Neurological: She is alert and oriented to person, place, and time.  Skin: Skin is warm and dry.  Surgical incision is clean, mild serosanguinous discharge, no purulent discharge, no erythema or cellulitis  Psychiatric: She has a normal mood and affect. Her behavior is normal. Judgment and thought content normal.  Nursing note and vitals reviewed.    ED Treatments / Results  Labs (all labs ordered are listed, but only abnormal results are displayed) Labs Reviewed  CBC WITH DIFFERENTIAL/PLATELET - Abnormal; Notable for the following:       Result Value   WBC 10.8 (*)    Hemoglobin 11.3 (*)    HCT 35.2 (*)    Neutro Abs 7.9 (*)    All other components within normal limits  BASIC METABOLIC PANEL - Abnormal; Notable for the following:    Calcium 8.8 (*)    All other components within normal limits  CBG MONITORING, ED - Abnormal; Notable for the following:    Glucose-Capillary 164 (*)    All other components within normal limits  URINALYSIS, ROUTINE W REFLEX MICROSCOPIC (NOT AT [facility])  URINE RAPID DRUG SCREEN, HOSP PERFORMED  I-STAT CG4 LACTIC ACID, ED    EKG  EKG Interpretation None       Radiology Dg Chest 2 View  Result Date: 12/13/2015 CLINICAL DATA:  Spinal surgery 3 days ago. Worsening lethargy following discharge. EXAM: CHEST  2 VIEW COMPARISON:  12/07/2015.  06/26/2014. FINDINGS: Heart size is normal. Mediastinal shadows are normal. There is mild atelectasis in both lower lobes. Chronic calcified granuloma at the left lung base as seen previously. No evidence of  heart failure or effusion. IMPRESSION: Mild atelectasis in both lower lobes. No dense consolidation or lobar collapse. Electronically Signed   By:  Paulina FusiMark  Shogry M.D.   On: 12/13/2015 16:17   Ct Head Wo Contrast  Result Date: 12/13/2015 CLINICAL DATA:  Lethargy/altered mental status EXAM: CT HEAD WITHOUT CONTRAST TECHNIQUE: Contiguous axial images were obtained from the base of the skull through the vertex without intravenous contrast. COMPARISON:  None. FINDINGS: Brain: The ventricles are normal in size and configuration. There is no intracranial mass, hemorrhage, extra-axial fluid collection, or midline shift. Gray-white compartments appear normal. No acute infarct evident. Vascular: There is mild calcification in the carotid siphon regions bilaterally. There is no hyperdense vessel. Skull: The bony calvarium appears intact. Sinuses/Orbits: There is mild mucosal thickening in the right maxillary antrum. Other paranasal sinuses are clear. Orbits appear symmetric bilaterally. Other: Mastoid air cells are clear. IMPRESSION: No intracranial mass, hemorrhage, or extra-axial fluid collection. Gray-white compartments appear normal. Mild carotid siphon vascular calcification. Mild right maxillary sinus mucosal thickening. Electronically Signed   By: Bretta BangWilliam  Woodruff III M.D.   On: 12/13/2015 16:01   Ct Angio Chest Pe W Or Wo Contrast  Result Date: 12/13/2015 CLINICAL DATA:  Hypoxia.  Recent surgery. EXAM: CT ANGIOGRAPHY CHEST WITH CONTRAST TECHNIQUE: Multidetector CT imaging of the chest was performed using the standard protocol during bolus administration of intravenous contrast. Multiplanar CT image reconstructions and MIPs were obtained to evaluate the vascular anatomy. CONTRAST:  100 cc Isovue 370 intravenous COMPARISON:  None. FINDINGS: Cardiovascular: Normal heart size. No pericardial effusion. Study optimized for evaluating the pulmonary arteries. There is intermittent respiratory motion but overall diagnostic exam. No evidence of pulmonary embolism. Negative thoracic aorta. Mediastinum: Remote granulomatous disease with calcified mediastinal and left  hilar lymph nodes. Lungs/Pleura: Low volume chest with subsegmental atelectasis. There is no edema, consolidation, effusion, or pneumothorax. Calcified granuloma in the left lung. Upper abdomen: No acute findings. Musculoskeletal: No acute or aggressive finding. Probable reduction mammoplasty bilaterally. Fatty atrophy of right sub scapularis compatible with chronic rotator cuff tear. Right glenohumeral arthroplasty. Review of the MIP images confirms the above findings. IMPRESSION: 1. No evidence of pulmonary embolism. 2. Low volume chest with subsegmental atelectasis. Electronically Signed   By: Marnee SpringJonathon  Watts M.D.   On: 12/13/2015 19:20    Procedures Procedures (including critical care time)  Medications Ordered in ED Medications - No data to display   Initial Impression / Assessment and Plan / ED Course  I have reviewed the triage vital signs and the nursing notes.  Pertinent labs & imaging results that were available during my care of the patient were reviewed by me and considered in my medical decision making (see chart for details).  Clinical Course    Patient presents status post lumbar laminectomy/decompression right quad was acting normally until this morning, when she was found to be altered by her husband. Noted to be slightly hypoxic on triage. Will check chest x-ray. Chest x-ray remarkable for atelectasis. He is high risk for PE given the recent surgery, will check this angiogram. CT PE study is negative for PE or pneumonia. Laboratory workup is reassuring. Patient is still not back to baseline. The husband's concern, stating that this is not normal for her.  Appreciate Dr. Robb Matarrtiz for admitting the patient. Will add urine drug screen.  Final Clinical Impressions(s) / ED Diagnoses   Final diagnoses:  Altered mental status, unspecified altered mental status type    New Prescriptions New Prescriptions   No  medications on file     Roxy Horseman, PA-C 12/13/15 2015      Lyndal Pulley, MD 12/14/15 [phone]

## 2015-12-13 NOTE — ED Notes (Signed)
Spoke with patient's significant other, he states that patient took 1 pill of each medication (morphine ER, percocet, flexiril, and robaxin) when she was discharged home. After eating dinner, she reported wanting to go to sleep, so she took an Ambien. Her husband then states that he left the same pills (1 of each) in a small container next to her bedside and she took them at some point during the night.

## 2015-12-13 NOTE — ED Notes (Signed)
Pt was able to do a clean catch urine sample so in and out cath not needed.

## 2015-12-14 DIAGNOSIS — R4182 Altered mental status, unspecified: Secondary | ICD-10-CM | POA: Diagnosis not present

## 2015-12-14 LAB — CBC
HEMATOCRIT: 31.5 % — AB (ref 36.0–46.0)
HEMOGLOBIN: 9.7 g/dL — AB (ref 12.0–15.0)
MCH: 27.8 pg (ref 26.0–34.0)
MCHC: 30.8 g/dL (ref 30.0–36.0)
MCV: 90.3 fL (ref 78.0–100.0)
Platelets: 293 10*3/uL (ref 150–400)
RBC: 3.49 MIL/uL — ABNORMAL LOW (ref 3.87–5.11)
RDW: 13.1 % (ref 11.5–15.5)
WBC: 7.7 10*3/uL (ref 4.0–10.5)

## 2015-12-14 LAB — COMPREHENSIVE METABOLIC PANEL
ALBUMIN: 2.5 g/dL — AB (ref 3.5–5.0)
ALK PHOS: 75 U/L (ref 38–126)
ALT: 15 U/L (ref 14–54)
ANION GAP: 5 (ref 5–15)
AST: 24 U/L (ref 15–41)
BILIRUBIN TOTAL: 0.2 mg/dL — AB (ref 0.3–1.2)
BUN: 6 mg/dL (ref 6–20)
CALCIUM: 8.5 mg/dL — AB (ref 8.9–10.3)
CO2: 29 mmol/L (ref 22–32)
CREATININE: 0.65 mg/dL (ref 0.44–1.00)
Chloride: 106 mmol/L (ref 101–111)
GFR calc Af Amer: >60 mL/min (ref >60)
GFR calc non Af Amer: >60 mL/min (ref >60)
GLUCOSE: 103 mg/dL — AB (ref 65–99)
Potassium: 3.8 mmol/L (ref 3.5–5.1)
Sodium: 140 mmol/L (ref 135–145)
TOTAL PROTEIN: 5.3 g/dL — AB (ref 6.5–8.1)

## 2015-12-14 LAB — HEMOGLOBIN A1C
Hgb A1c MFr Bld: 5.8 % — ABNORMAL HIGH (ref 4.8–5.6)
Mean Plasma Glucose: 120 mg/dL

## 2015-12-14 LAB — GLUCOSE, CAPILLARY
GLUCOSE-CAPILLARY: 96 mg/dL (ref 65–99)
Glucose-Capillary: 99 mg/dL (ref 65–99)

## 2015-12-14 MED ORDER — SODIUM CHLORIDE 0.9% FLUSH
3.0000 mL | Freq: Two times a day (BID) | INTRAVENOUS | Status: DC
  Administered 2015-12-14: 3 mL via INTRAVENOUS

## 2015-12-14 MED ORDER — ALPRAZOLAM 0.5 MG PO TABS
1.0000 mg | ORAL_TABLET | Freq: Two times a day (BID) | ORAL | Status: DC | PRN
  Administered 2015-12-14: 1 mg via ORAL
  Filled 2015-12-14: qty 2

## 2015-12-14 MED ORDER — OXYCODONE-ACETAMINOPHEN 5-325 MG PO TABS
1.0000 | ORAL_TABLET | Freq: Three times a day (TID) | ORAL | Status: DC | PRN
  Filled 2015-12-14: qty 1

## 2015-12-14 MED ORDER — OXYCODONE-ACETAMINOPHEN 10-325 MG PO TABS: 1.0000 | ORAL_TABLET | Freq: Four times a day (QID) | ORAL | 0 refills | Status: DC | PRN

## 2015-12-14 MED ORDER — OXYCODONE-ACETAMINOPHEN 10-325 MG PO TABS: 1.0000 | ORAL_TABLET | Freq: Three times a day (TID) | ORAL | Status: DC | PRN

## 2015-12-14 MED ORDER — MORPHINE SULFATE (PF) 2 MG/ML IV SOLN
2.0000 mg | INTRAVENOUS | Status: DC | PRN
  Administered 2015-12-14: 2 mg via INTRAVENOUS
  Filled 2015-12-14: qty 1

## 2015-12-14 MED ORDER — SODIUM CHLORIDE 0.9 % IV SOLN
INTRAVENOUS | Status: AC
  Administered 2015-12-14 (×2): via INTRAVENOUS

## 2015-12-14 MED ORDER — OXYCODONE HCL 5 MG PO TABS: 5.0000 mg | ORAL_TABLET | Freq: Three times a day (TID) | ORAL | Status: DC | PRN

## 2015-12-14 MED ORDER — HYDROMORPHONE HCL 1 MG/ML IJ SOLN
1.0000 mg | INTRAMUSCULAR | Status: AC | PRN
  Administered 2015-12-14 (×2): 1 mg via INTRAVENOUS
  Filled 2015-12-14 (×2): qty 1

## 2015-12-14 MED ORDER — VENLAFAXINE HCL ER 75 MG PO CP24
75.0000 mg | ORAL_CAPSULE | Freq: Every day | ORAL | Status: DC
  Administered 2015-12-14: 75 mg via ORAL
  Filled 2015-12-14: qty 1

## 2015-12-14 NOTE — Discharge Summary (Signed)
Physician Discharge Summary  Kara [name] WUJ:[id number] DOB: Aug 04, 1955 DOA: 12/13/2015  PCP: Felix Pacini, DO  Admit date: 12/13/2015 Discharge date: 12/14/2015  Time spent: 45 minutes  Recommendations for Outpatient Follow-up:  Dr.Yates in 1week, caution with sedating medications  Discharge Diagnoses:  Principal Problem:   Altered mental status   Polypharmacy   Rheumatoid arthritis (HCC)   Fibromyalgia   Severe recurrent major depression without psychotic features (HCC)   PTSD (post-traumatic stress disorder)   S/P lumbar fusion   Chronic pain   Prediabetes   Discharge Condition: stable  Diet recommendation: Carb modified  Filed Weights   12/13/15 1356 12/13/15 2353  Weight: 68 kg (150 lb) 77.4 kg (170 lb 10.2 oz)    History of present illness:  Kara [name] is a 60 y.o. female with medical history significant of ADD, Allergy, anxiety, depression, PTSD, chronic fatigue syndrome, fibromyalgia, nephrolithiasis, Rheumatoid arthritis, prediabetes who underwent a lumbar laminectomy/decompression on Friday 12/10/2015 and is brought to the ER 9/18 after her husband found her lying on her face with AMS at home this morning.  Hospital Course:  Encephalopathy -due to polypharmacy -takes MS Contin/Percocet 10/325mg /RObaxin/Flexeril/Xanax, no intentional overdose -due to taking all these medications together -pt was alert and awake the next morning after she was admitted last night and some home meds were held, extremely belligerent/very rude with staff and demanding IV pain medications to control her pain. -she was walking around in the hallways around the nursing station and did not appear to be in any distress, ate her breakfast and I discussed with the patient and husband about the need to space out the sedating meds, cut down Percocet to 1 tablet instead of 2 every 4-6hours and stopping Robaxin, continuing flexeril PRN only -advised FU with Dr.Yates    Severe recurrent major  depression without psychotic features (HCC) -No of significant symptoms of depression at this time. -Continue Effexor 75 mg by mouth daily.    PTSD (post-traumatic stress disorder) -stable      S/P lumbar fusion/  Chronic pain/  Fibromyalgia -resumed home narcotics at lower dose with caution     Prediabetes -FU with PCP    Rheumatoid arthritis (HCC) -FU with Rheum  Discharge Exam: Vitals:   12/14/15 0722 12/14/15 0732  BP: 102/65 (!) 99/53  Pulse: 73 76  Resp:    Temp:      General: AAOx3 Cardiovascular: S1S2/RRR Respiratory: CTAB  Discharge Instructions   Discharge Instructions    Diet general    Complete by:  As directed    Increase activity slowly    Complete by:  As directed      Discharge Medication List as of 12/14/2015 10:48 AM    CONTINUE these medications which have CHANGED   Details  oxyCODONE-acetaminophen (PERCOCET) 10-325 MG tablet Take 1 tablet by mouth every 6 (six) hours as needed for pain., Starting Tue 12/14/2015, No Print      CONTINUE these medications which have NOT CHANGED   Details  ADDERALL XR 30 MG 24 hr capsule Take 30 mg by mouth 2 (two) times daily. , Starting Tue 11/02/2015, Historical Med    ALPRAZolam (XANAX) 1 MG tablet Take 1 mg by mouth 2 (two) times daily as needed for anxiety. , Starting Mon 07/06/2014, Historical Med    cyclobenzaprine (FLEXERIL) 10 MG tablet Take 1 tablet (10 mg total) by mouth 3 (three) times daily as needed for muscle spasms., Starting Sun 12/12/2015, Normal    meloxicam (MOBIC) 15 MG tablet Take 15  mg by mouth daily., Starting Mon 11/22/2015, Historical Med    morphine (MS CONTIN) 15 MG 12 hr tablet Take 1 tablet (15 mg total) by mouth every 12 (twelve) hours as needed for pain., Starting Sun 12/12/2015, Print    tretinoin (RETIN-A) 0.1 % cream Apply 1 application topically at bedtime. 90 Day Supply, Starting Wed 08/18/2015, Normal    venlafaxine XR (EFFEXOR-XR) 75 MG 24 hr capsule Take 75 mg by mouth daily  with breakfast., Historical Med    zolpidem (AMBIEN) 10 MG tablet TAKE 1 TABLET (10 mg) BY MOUTH AT BEDTIME AS NEEDED for sleep, Historical Med      STOP taking these medications     methocarbamol (ROBAXIN) 500 MG tablet        Allergies  Allergen Reactions  . Aspirin Anaphylaxis  . Ibuprofen Anaphylaxis  . Ativan [Lorazepam] Other (See Comments)    Makes agitated, combative   . Ketamine Other (See Comments)    Hallucinations  . Toradol [Ketorolac Tromethamine] Itching, Swelling and Rash    UNSPECIFIED REACTION   . Tramadol Hcl Itching, Swelling and Rash    SWELLING REACTION UNSPECIFIED       The results of significant diagnostics from this hospitalization (including imaging, microbiology, ancillary and laboratory) are listed below for reference.    Significant Diagnostic Studies: Dg Chest 2 View  Result Date: 12/13/2015 CLINICAL DATA:  Spinal surgery 3 days ago. Worsening lethargy following discharge. EXAM: CHEST  2 VIEW COMPARISON:  12/07/2015.  06/26/2014. FINDINGS: Heart size is normal. Mediastinal shadows are normal. There is mild atelectasis in both lower lobes. Chronic calcified granuloma at the left lung base as seen previously. No evidence of heart failure or effusion. IMPRESSION: Mild atelectasis in both lower lobes. No dense consolidation or lobar collapse. Electronically Signed   By: Paulina Fusi M.D.   On: 12/13/2015 16:17   Dg Chest 2 View  Result Date: 12/07/2015 CLINICAL DATA:  Pre op for back surgery scheduled for this Friday. Patient states that her blood pressure has been high they have the EXAM: CHEST  2 VIEW COMPARISON:  07/18/2014 FINDINGS: PA and lateral views of the chest demonstrate interim finding of surgical hardware within the lower cervical spine. A stable prosthesis is present in the proximal right humerus. Stable calcified lung nodule/presumed granuloma in the left upper lobe. No acute pulmonary infiltrate, consolidation, or effusion.  Cardiomediastinal silhouette within normal limits. No pneumothorax. No acute osseous abnormality. IMPRESSION: 1. No radiographic evidence for acute cardiopulmonary abnormality 2. Stable calcified left lung granuloma 3. Interim finding of surgical hardware in the lower cervical spine. Electronically Signed   By: Jasmine Pang M.D.   On: 12/07/2015 14:28   Dg Lumbar Spine Complete  Result Date: 12/10/2015 CLINICAL DATA:  Right L3-4 micro discectomy. Right L4 hemi laminectomy. EXAM: LUMBAR SPINE - COMPLETE 4+ VIEW COMPARISON:  MRI of November 14, 2015. FINDINGS: Four intraoperative cross-table lateral projections of the lumbar spine were obtained. These images demonstrate the patient be status post surgical posterior fusion of L4-5 and L5-S1 with bilateral intrapedicular screw placement and interbody fusion. The first image demonstrates surgical probe positioned over posterior elements of L2. The second image demonstrates surgical probe positioned over posterior elements of L3. The third image demonstrates surgical probes directed toward the posterior portions of L2 and L3. The final image demonstrates surgical probe directed toward the posterior margin of the L3-4 disc space. IMPRESSION: Surgical localization as described above. Electronically Signed   By: Lupita Raider, M.D.  On: 12/10/2015 13:52   Ct Head Wo Contrast  Result Date: 12/13/2015 CLINICAL DATA:  Lethargy/altered mental status EXAM: CT HEAD WITHOUT CONTRAST TECHNIQUE: Contiguous axial images were obtained from the base of the skull through the vertex without intravenous contrast. COMPARISON:  None. FINDINGS: Brain: The ventricles are normal in size and configuration. There is no intracranial mass, hemorrhage, extra-axial fluid collection, or midline shift. Gray-white compartments appear normal. No acute infarct evident. Vascular: There is mild calcification in the carotid siphon regions bilaterally. There is no hyperdense vessel. Skull: The bony  calvarium appears intact. Sinuses/Orbits: There is mild mucosal thickening in the right maxillary antrum. Other paranasal sinuses are clear. Orbits appear symmetric bilaterally. Other: Mastoid air cells are clear. IMPRESSION: No intracranial mass, hemorrhage, or extra-axial fluid collection. Gray-white compartments appear normal. Mild carotid siphon vascular calcification. Mild right maxillary sinus mucosal thickening. Electronically Signed   By: Bretta Bang III M.D.   On: 12/13/2015 16:01   Ct Angio Chest Pe W Or Wo Contrast  Result Date: 12/13/2015 CLINICAL DATA:  Hypoxia.  Recent surgery. EXAM: CT ANGIOGRAPHY CHEST WITH CONTRAST TECHNIQUE: Multidetector CT imaging of the chest was performed using the standard protocol during bolus administration of intravenous contrast. Multiplanar CT image reconstructions and MIPs were obtained to evaluate the vascular anatomy. CONTRAST:  100 cc Isovue 370 intravenous COMPARISON:  None. FINDINGS: Cardiovascular: Normal heart size. No pericardial effusion. Study optimized for evaluating the pulmonary arteries. There is intermittent respiratory motion but overall diagnostic exam. No evidence of pulmonary embolism. Negative thoracic aorta. Mediastinum: Remote granulomatous disease with calcified mediastinal and left hilar lymph nodes. Lungs/Pleura: Low volume chest with subsegmental atelectasis. There is no edema, consolidation, effusion, or pneumothorax. Calcified granuloma in the left lung. Upper abdomen: No acute findings. Musculoskeletal: No acute or aggressive finding. Probable reduction mammoplasty bilaterally. Fatty atrophy of right sub scapularis compatible with chronic rotator cuff tear. Right glenohumeral arthroplasty. Review of the MIP images confirms the above findings. IMPRESSION: 1. No evidence of pulmonary embolism. 2. Low volume chest with subsegmental atelectasis. Electronically Signed   By: Marnee Spring M.D.   On: 12/13/2015 19:20     Microbiology: Recent Results (from the past 240 hour(s))  Surgical pcr screen     Status: Abnormal   Collection Time: 12/07/15  1:32 PM  Result Value Ref Range Status   MRSA, PCR NEGATIVE NEGATIVE Final   Staphylococcus aureus POSITIVE (A) NEGATIVE Final    Comment:        The Xpert SA Assay (FDA approved for NASAL specimens in patients over 13 years of age), is one component of a comprehensive surveillance program.  Test performance has been validated by [facility] for patients greater than or equal to 17 year old. It is not intended to diagnose infection nor to guide or monitor treatment.      Labs: Basic Metabolic Panel:  Recent Labs Lab 12/13/15 1622 12/13/15 2046 12/14/15 0408  NA 136  --  140  K 4.4  --  3.8  CL 105  --  106  CO2 24  --  29  GLUCOSE 82  --  103*  BUN 11  --  6  CREATININE 0.68  --  0.65  CALCIUM 8.8*  --  8.5*  MG  --  1.8  --    Liver Function Tests:  Recent Labs Lab 12/13/15 2046 12/14/15 0408  AST 25 24  ALT 16 15  ALKPHOS 74 75  BILITOT 0.4 0.2*  PROT 5.4* 5.3*  ALBUMIN 2.8* 2.5*   No results for input(s): LIPASE, AMYLASE in the last 168 hours.  Recent Labs Lab 12/13/15 2046  AMMONIA 21   CBC:  Recent Labs Lab 12/13/15 1622 12/14/15 0408  WBC 10.8* 7.7  NEUTROABS 7.9*  --   HGB 11.3* 9.7*  HCT 35.2* 31.5*  MCV 88.9 90.3  PLT 281 293   Cardiac Enzymes: No results for input(s): CKTOTAL, CKMB, CKMBINDEX, TROPONINI in the last 168 hours. BNP: BNP (last 3 results) No results for input(s): BNP in the last 8760 hours.  ProBNP (last 3 results) No results for input(s): PROBNP in the last 8760 hours.  CBG:  Recent Labs Lab 12/13/15 1400 12/14/15 0106 12/14/15 0818  GLUCAP 164* 96 99       Signed:  Jaceon Heiberger MD.  [facility] 12/14/2015, 4:00 PM

## 2015-12-14 NOTE — Progress Notes (Signed)
Nsg Discharge Note  Admit Date:  12/13/2015 Discharge date: 12/14/2015   [name] to be D/C'd Home per MD order.  AVS completed.  Copy for chart, and copy for patient signed, and dated. Patient/caregiver able to verbalize understanding.  Discharge Medication:   Medication List    STOP taking these medications   methocarbamol 500 MG tablet Commonly known as:  ROBAXIN     TAKE these medications   ADDERALL XR 30 MG 24 hr capsule Generic drug:  amphetamine-dextroamphetamine Take 30 mg by mouth 2 (two) times daily.   ALPRAZolam 1 MG tablet Commonly known as:  XANAX Take 1 mg by mouth 2 (two) times daily as needed for anxiety.   cyclobenzaprine 10 MG tablet Commonly known as:  FLEXERIL Take 1 tablet (10 mg total) by mouth 3 (three) times daily as needed for muscle spasms.   meloxicam 15 MG tablet Commonly known as:  MOBIC Take 15 mg by mouth daily.   morphine 15 MG 12 hr tablet Commonly known as:  MS CONTIN Take 1 tablet (15 mg total) by mouth every 12 (twelve) hours as needed for pain.   oxyCODONE-acetaminophen 10-325 MG tablet Commonly known as:  PERCOCET Take 1 tablet by mouth every 6 (six) hours as needed for pain. What changed:  how much to take   tretinoin 0.1 % cream Commonly known as:  RETIN-A Apply 1 application topically at bedtime. 90 Day Supply   venlafaxine XR 75 MG 24 hr capsule Commonly known as:  EFFEXOR-XR Take 75 mg by mouth daily with breakfast. What changed:  Another medication with the same name was removed. Continue taking this medication, and follow the directions you see here.   zolpidem 10 MG tablet Commonly known as:  AMBIEN TAKE 1 TABLET (10 mg) BY MOUTH AT BEDTIME AS NEEDED for sleep       Discharge Assessment: Vitals:   12/14/15 0722 12/14/15 0732  BP: 102/65 (!) 99/53  Pulse: 73 76  Resp:    Temp:     Skin clean, dry and intact without evidence of skin break down, no evidence of skin tears noted. IV catheter discontinued  intact. Site without signs and symptoms of complications - no redness or edema noted at insertion site, patient denies c/o pain - only slight tenderness at site.  Dressing with slight pressure applied.  D/c Instructions-Education: Discharge instructions given to patient/family with verbalized understanding. D/c education completed with patient/family including follow up instructions, medication list, d/c activities limitations if indicated, with other d/c instructions as indicated by MD - patient able to verbalize understanding, all questions fully answered. Patient instructed to return to ED, call 911, or call MD for any changes in condition.  Patient escorted via WC, and D/C home via private auto.  Camillo Flaming, RN 12/14/2015 11:13 AM

## 2015-12-16 ENCOUNTER — Telehealth: Payer: Self-pay | Admitting: Family Medicine

## 2015-12-16 ENCOUNTER — Other Ambulatory Visit: Payer: Self-pay | Admitting: Family Medicine

## 2015-12-16 MED ORDER — MELOXICAM 15 MG PO TABS: 15.0000 mg | ORAL_TABLET | Freq: Every day | ORAL | 3 refills | Status: DC

## 2015-12-16 NOTE — Telephone Encounter (Signed)
Transition Care Management Follow-up Telephone Call   Date discharged? 12/14/2015   How have you been since you were released from the hospital? Just a lot of pain, no strength in R arm.    Do you understand why you were in the hospital? yes   Do you understand the discharge instructions? yes   Where were you discharged to? Home   Items Reviewed:  Medications reviewed: yes  Allergies reviewed: no  Dietary changes reviewed: yes  Referrals reviewed: no   Functional Questionnaire:   Activities of Daily Living (ADLs):   She states they are independent in the following: cleaning, cooking.  States they require assistance with the following: none   Any transportation issues/concerns?: no   Any patient concerns? no   Confirmed importance and date/time of follow-up visits scheduled yes  Provider Appointment booked with Dr Claiborne Billings 12/22/15  Confirmed with patient if condition begins to worsen call PCP or go to the ER.  Patient was given the office number and encouraged to call back with question or concerns.  : yes

## 2015-12-16 NOTE — Discharge Summary (Signed)
Patient ID: Kara [name] MRN: [id number] DOB/AGE: January 17, 1956 60 y.o.  Admit date: 12/10/2015 Discharge date: 12/16/2015  Admission Diagnoses:  Active Problems:   HNP (herniated nucleus pulposus), lumbar   Discharge Diagnoses:  Active Problems:   HNP (herniated nucleus pulposus), lumbar  status post Procedure(s): Right L3-4 Hemilaminectomy, Excision of herniated nucleus pulposus  Past Medical History:  Diagnosis Date  . ADD (attention deficit disorder)    on Adderal  . Aggressive behavior of adult   . Allergy   . Anxiety   . Arthritis   . Asthma    as a child  . Cellulitis of breast 11/2013   RIGHT BREAST  . Chronic fatigue and immune dysfunction syndrome   . Chronic kidney disease    due to infection from stone- no regular visits- fine now  . Chronic pain    goes to [facility] control  . Cold sore   . 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 Depression    takes meds daily  . Family history of malignant neoplasm of breast   . Fibromyalgia   . Nephrolithiasis   . Osteoporosis   . Other specified rheumatoid arthritis, right shoulder (HCC) 08/01/2011  . Post-nasal drip    hx of  . Rheumatoid arthritis(714.0)    autoimmune arthritis; methotrexate once/week  . Urinary incontinence     Surgeries: Procedure(s): Right L3-4 Hemilaminectomy, Excision of herniated nucleus pulposus on 12/10/2015   Consultants:   Discharged Condition: Improved  Hospital Course: Kamilya Wakeman is an 60 y.o. female who was admitted 12/10/2015 for operative treatment of lumbar stenosis. Patient failed conservative treatments (please see the history and physical for the specifics) and had severe unremitting pain that affects sleep, daily activities and work/hobbies. After pre-op clearance, the patient was taken to the operating room on 12/10/2015 and underwent  Procedure(s): Right L3-4 Hemilaminectomy, Excision of herniated nucleus  pulposus.    Patient was given perioperative antibiotics: Anti-infectives    Start     Dose/Rate Route Frequency Ordered Stop   12/10/15 1800  ceFAZolin (ANCEF) IVPB 1 g/50 mL premix     1 g 100 mL/hr over 30 Minutes Intravenous Every 8 hours 12/10/15 1557 12/11/15 0246   12/10/15 0930  ceFAZolin (ANCEF) IVPB 2g/100 mL premix     2 g 200 mL/hr over 30 Minutes Intravenous To [facility] 12/09/15 1349 12/10/15 1038       Patient was given sequential compression devices and early ambulation to prevent DVT.   Patient benefited maximally from hospital stay and there were no complications. At the time of discharge, the patient was urinating/moving their bowels without difficulty, tolerating a regular diet, pain is controlled with oral pain medications and they have been cleared by PT/OT.   Recent vital signs: No data found.    Recent laboratory studies:  Recent Labs  12/13/15 1622 12/14/15 0408  WBC 10.8* 7.7  HGB 11.3* 9.7*  HCT 35.2* 31.5*  PLT 281 293  NA 136 140  K 4.4 3.8  CL 105 106  CO2 24 29  BUN 11 6  CREATININE 0.68 0.65  GLUCOSE 82 103*  CALCIUM 8.8* 8.5*     Discharge Medications:     Medication List    STOP taking these medications   diclofenac sodium 1 % Gel Commonly known as:  VOLTAREN   meloxicam 15 MG tablet Commonly known as:  MOBIC   methocarbamol 500 MG tablet  Commonly known as:  ROBAXIN   Oxycodone HCl 10 MG Tabs     TAKE these medications   ADDERALL XR 30 MG 24 hr capsule Generic drug:  amphetamine-dextroamphetamine Take 30 mg by mouth 2 (two) times daily.   ALPRAZolam 1 MG tablet Commonly known as:  XANAX Take 1 mg by mouth 2 (two) times daily as needed for anxiety.   cyclobenzaprine 10 MG tablet Commonly known as:  FLEXERIL Take 1 tablet (10 mg total) by mouth 3 (three) times daily as needed for muscle spasms. What changed:  when to take this   morphine 15 MG 12 hr tablet Commonly known as:  MS CONTIN Take 1 tablet (15  mg total) by mouth every 12 (twelve) hours as needed for pain.   tretinoin 0.1 % cream Commonly known as:  RETIN-A Apply 1 application topically at bedtime. 90 Day Supply   zolpidem 10 MG tablet Commonly known as:  AMBIEN TAKE 1 TABLET (10 mg) BY MOUTH AT BEDTIME AS NEEDED for sleep       Diagnostic Studies: Dg Chest 2 View  Result Date: 12/13/2015 CLINICAL DATA:  Spinal surgery 3 days ago. Worsening lethargy following discharge. EXAM: CHEST  2 VIEW COMPARISON:  12/07/2015.  06/26/2014. FINDINGS: Heart size is normal. Mediastinal shadows are normal. There is mild atelectasis in both lower lobes. Chronic calcified granuloma at the left lung base as seen previously. No evidence of heart failure or effusion. IMPRESSION: Mild atelectasis in both lower lobes. No dense consolidation or lobar collapse. Electronically Signed   By: Paulina Fusi M.D.   On: 12/13/2015 16:17   Dg Chest 2 View  Result Date: 12/07/2015 CLINICAL DATA:  Pre op for back surgery scheduled for this Friday. Patient states that her blood pressure has been high they have the EXAM: CHEST  2 VIEW COMPARISON:  07/18/2014 FINDINGS: PA and lateral views of the chest demonstrate interim finding of surgical hardware within the lower cervical spine. A stable prosthesis is present in the proximal right humerus. Stable calcified lung nodule/presumed granuloma in the left upper lobe. No acute pulmonary infiltrate, consolidation, or effusion. Cardiomediastinal silhouette within normal limits. No pneumothorax. No acute osseous abnormality. IMPRESSION: 1. No radiographic evidence for acute cardiopulmonary abnormality 2. Stable calcified left lung granuloma 3. Interim finding of surgical hardware in the lower cervical spine. Electronically Signed   By: Jasmine Pang M.D.   On: 12/07/2015 14:28   Dg Lumbar Spine Complete  Result Date: 12/10/2015 CLINICAL DATA:  Right L3-4 micro discectomy. Right L4 hemi laminectomy. EXAM: LUMBAR SPINE - COMPLETE  4+ VIEW COMPARISON:  MRI of November 14, 2015. FINDINGS: Four intraoperative cross-table lateral projections of the lumbar spine were obtained. These images demonstrate the patient be status post surgical posterior fusion of L4-5 and L5-S1 with bilateral intrapedicular screw placement and interbody fusion. The first image demonstrates surgical probe positioned over posterior elements of L2. The second image demonstrates surgical probe positioned over posterior elements of L3. The third image demonstrates surgical probes directed toward the posterior portions of L2 and L3. The final image demonstrates surgical probe directed toward the posterior margin of the L3-4 disc space. IMPRESSION: Surgical localization as described above. Electronically Signed   By: Lupita Raider, M.D.   On: 12/10/2015 13:52   Ct Head Wo Contrast  Result Date: 12/13/2015 CLINICAL DATA:  Lethargy/altered mental status EXAM: CT HEAD WITHOUT CONTRAST TECHNIQUE: Contiguous axial images were obtained from the base of the skull through the vertex without intravenous contrast.  COMPARISON:  None. FINDINGS: Brain: The ventricles are normal in size and configuration. There is no intracranial mass, hemorrhage, extra-axial fluid collection, or midline shift. Gray-white compartments appear normal. No acute infarct evident. Vascular: There is mild calcification in the carotid siphon regions bilaterally. There is no hyperdense vessel. Skull: The bony calvarium appears intact. Sinuses/Orbits: There is mild mucosal thickening in the right maxillary antrum. Other paranasal sinuses are clear. Orbits appear symmetric bilaterally. Other: Mastoid air cells are clear. IMPRESSION: No intracranial mass, hemorrhage, or extra-axial fluid collection. Gray-white compartments appear normal. Mild carotid siphon vascular calcification. Mild right maxillary sinus mucosal thickening. Electronically Signed   By: Bretta Bang III M.D.   On: 12/13/2015 16:01   Ct Angio  Chest Pe W Or Wo Contrast  Result Date: 12/13/2015 CLINICAL DATA:  Hypoxia.  Recent surgery. EXAM: CT ANGIOGRAPHY CHEST WITH CONTRAST TECHNIQUE: Multidetector CT imaging of the chest was performed using the standard protocol during bolus administration of intravenous contrast. Multiplanar CT image reconstructions and MIPs were obtained to evaluate the vascular anatomy. CONTRAST:  100 cc Isovue 370 intravenous COMPARISON:  None. FINDINGS: Cardiovascular: Normal heart size. No pericardial effusion. Study optimized for evaluating the pulmonary arteries. There is intermittent respiratory motion but overall diagnostic exam. No evidence of pulmonary embolism. Negative thoracic aorta. Mediastinum: Remote granulomatous disease with calcified mediastinal and left hilar lymph nodes. Lungs/Pleura: Low volume chest with subsegmental atelectasis. There is no edema, consolidation, effusion, or pneumothorax. Calcified granuloma in the left lung. Upper abdomen: No acute findings. Musculoskeletal: No acute or aggressive finding. Probable reduction mammoplasty bilaterally. Fatty atrophy of right sub scapularis compatible with chronic rotator cuff tear. Right glenohumeral arthroplasty. Review of the MIP images confirms the above findings. IMPRESSION: 1. No evidence of pulmonary embolism. 2. Low volume chest with subsegmental atelectasis. Electronically Signed   By: Marnee Spring M.D.   On: 12/13/2015 19:20    Discharge Instructions    Call MD / Call 911    Complete by:  As directed    If you experience chest pain or shortness of breath, CALL 911 and be transported to the hospital emergency room.  If you develope a fever above 101 F, pus (white drainage) or increased drainage or redness at the wound, or calf pain, call your surgeon's office.   Constipation Prevention    Complete by:  As directed    Drink plenty of fluids.  Prune juice may be helpful.  You may use a stool softener, such as Colace (over the counter) 100 mg  twice a day.  Use MiraLax (over the counter) for constipation as needed.   Diet - low sodium heart healthy    Complete by:  As directed    Increase activity slowly as tolerated    Complete by:  As directed       Follow-up Information    Schedule an appointment as soon as possible for a visit today with Eldred Manges, MD.   Specialty:  Orthopedic Surgery Why:  call office to schedule appointment to be seen one week postop.  Contact information: [address] [place] [address] [phone]           Discharge Plan:  discharge to home  Disposition:     Signed: Naida Sleight  12/16/2015, 11:01 AM

## 2015-12-16 NOTE — Telephone Encounter (Signed)
LMOM for pt to CB to discuss TCM questions.  ( patient dx'd with pre-diabetes while in hospital, needs to follow up with PCP ).

## 2015-12-16 NOTE — Telephone Encounter (Signed)
Patient requesting rf of meloxicam.  Patient states that she is out of this medication.  Please advise.

## 2015-12-16 NOTE — Progress Notes (Signed)
Refilled

## 2015-12-22 ENCOUNTER — Ambulatory Visit (INDEPENDENT_AMBULATORY_CARE_PROVIDER_SITE_OTHER): Payer: Managed Care, Other (non HMO) | Admitting: Family Medicine

## 2015-12-22 ENCOUNTER — Encounter: Payer: Self-pay | Admitting: Family Medicine

## 2015-12-22 VITALS — BP 130/85 | HR 78 | Temp 98.0°F | Resp 20 | Ht 64.0 in | Wt 160.5 lb

## 2015-12-22 DIAGNOSIS — Z9889 Other specified postprocedural states: Secondary | ICD-10-CM

## 2015-12-22 DIAGNOSIS — R7303 Prediabetes: Secondary | ICD-10-CM | POA: Diagnosis not present

## 2015-12-22 DIAGNOSIS — L089 Local infection of the skin and subcutaneous tissue, unspecified: Secondary | ICD-10-CM | POA: Diagnosis not present

## 2015-12-22 DIAGNOSIS — Z9289 Personal history of other medical treatment: Secondary | ICD-10-CM | POA: Insufficient documentation

## 2015-12-22 DIAGNOSIS — R4182 Altered mental status, unspecified: Secondary | ICD-10-CM

## 2015-12-22 MED ORDER — AMOXICILLIN-POT CLAVULANATE 875-125 MG PO TABS: 1.0000 | ORAL_TABLET | Freq: Two times a day (BID) | ORAL | 0 refills | Status: DC

## 2015-12-22 NOTE — Progress Notes (Signed)
Kara [name] , Jul 19, 1955, 60 y.o., female MRN: [id number] Patient Care Team    Relationship Specialty Notifications Start End  Natalia Leatherwood, DO PCP - General Family Medicine  06/14/15   Eldred Manges, MD Consulting Physician Orthopedic Surgery  06/14/15   Milagros Evener, MD Consulting Physician Psychiatry  06/14/15     CC: TCM hospital follow up  Subjective:  Admit date: 12/13/2015 Discharge date: 12/14/2015  Kara [name] is a 60 y.o. female with medical history significant for ADD, Allergy, anxiety, depression, PTSD, chronic fatigue syndrome, fibromyalgia, nephrolithiasis, Rheumatoid arthritis, prediabetes who underwent a lumbar laminectomy/decompression on Friday 12/10/2015. Pt was discharged 12/12/2015 and returned to the ED the following day. Her husband reportedly found her face down with AMS. The patient states she does not recall the events, but then states she stood up and fell, then went back to bed and that's when her husband found her. She becomes tearful and states she is frustrated and tired of being in pain. She feels the wound/incision hurts. She endorses redness around the incision, chills and minimal drainage. Pt was started MS contin after surgery, and prescribed muscle relaxers. Confusing history between her and notes what exactly she was taking but by review of notes it appears they felt it was unintentional overdose. She had flexeril, robaxin, ambien, MS contin, oxycodone and xanax. Her UDS was positive for opiates, benzo, amphetamines, THC. Labs and imagining reviewed, results below.  Mild anemia, hypocalcemia post-op with normal chest and brain imaging.  Pt states she is not taking the MS contin or flexeril now.  She has followed with pain management in the past, but stopped going.  She has psychiatric history Admission 02/2014, suicide attempt. Under the care of Dr. [name] (Psych).  Drugs of Abuse     Component Value Date/Time   LABOPIA POSITIVE (A) 12/13/2015 1632   COCAINSCRNUR NONE DETECTED 12/13/2015 1632   LABBENZ POSITIVE (A) 12/13/2015 1632   AMPHETMU POSITIVE (A) 12/13/2015 1632   THCU POSITIVE (A) 12/13/2015 1632   LABBARB NONE DETECTED 12/13/2015 1632    CBC Latest Ref Rng & Units 12/14/2015 12/13/2015 12/07/2015  WBC 4.0 - 10.5 K/uL 7.7 10.8(H) 11.6(H)  Hemoglobin 12.0 - 15.0 g/dL 4.8(N) 11.3(L) 13.7  Hematocrit 36.0 - 46.0 % 31.5(L) 35.2(L) 43.3  Platelets 150 - 400 K/uL 293 281 416(H)   CMP Latest Ref Rng & Units 12/14/2015 12/13/2015 12/07/2015  Glucose 65 - 99 mg/dL 462(V) 82 93  BUN 6 - 20 mg/dL 6 11 14   Creatinine 0.44 - 1.00 mg/dL 0.35 0.09  Sodium 135 - 145 mmol/L 140 136 139  Potassium 3.5 - 5.1 mmol/L 3.8 4.4 3.8  Chloride 101 - 111 mmol/L 106 105 102  CO2 22 - 32 mmol/L 29 24 27   Calcium 8.9 - 10.3 mg/dL 3.81) ) 9.8  Total Protein 6.5 - 8.1 g/dL 5.3(L) 5.4(L) 7.5  Total Bilirubin 0.3 - 1.2 mg/dL 8.2(X) 0.4 0.7  Alkaline Phos 38 - 126 U/L 75 74 90  AST 15 - 41 U/L 24 25 18   ALT 14 - 54 U/L 15 16 17    Dg Chest 2 View  Result Date: 12/13/2015 CLINICAL DATA:  Spinal surgery 3 days ago. Worsening lethargy following discharge. EXAM: CHEST  2 VIEW COMPARISON:  IMPRESSION: Mild atelectasis in both lower lobes. No dense consolidation or lobar collapse. Electronically Signed   By: 1.6(R M.D.   On: 12/13/2015 16:17  Ct Head Wo Contrast  Result Date: 12/13/2015 CLINICAL DATA:  Lethargy/altered mental status EXAM: CT HEAD WITHOUT CONTRAST TECHNIQUE: Contiguous axial images were obtained from the base of the skull through the vertex without intravenous contrast.  IMPRESSION: No intracranial mass, hemorrhage, or extra-axial fluid collection. Gray-white compartments appear normal. Mild carotid siphon vascular calcification. Mild right maxillary sinus mucosal thickening. Electronically Signed   By: Bretta Bang III M.D.   On: 12/13/2015 16:01   Ct Angio Chest Pe W Or Wo Contrast  Result Date: 12/13/2015 CLINICAL DATA:   Hypoxia.  Recent surgery. EXAM: CT ANGIOGRAPHY CHEST WITH CONTRAST TECHNIQUE: Multidetector CT imaging of the chest was performed using the standard protocol during bolus administration of intravenous contrast.IMPRESSION: 1. No evidence of pulmonary embolism. 2. Low volume chest with subsegmental atelectasis. Electronically Signed   By: Marnee Spring M.D.   On: 12/13/2015 19:20    Allergies  Allergen Reactions  . Aspirin Anaphylaxis  . Ibuprofen Anaphylaxis  . Ativan [Lorazepam] Other (See Comments)    Makes agitated, combative   . Ketamine Other (See Comments)    Hallucinations  . Toradol [Ketorolac Tromethamine] Itching, Swelling and Rash    UNSPECIFIED REACTION   . Tramadol Hcl Itching, Swelling and Rash    SWELLING REACTION UNSPECIFIED    Social History  Substance Use Topics  . Smoking status: Former Smoker    Packs/day: 0.50    Years: 10.00    Types: Cigarettes    Quit date: 12/06/2005  . Smokeless tobacco: Never Used     Comment: smoked for 10 years  . Alcohol use No     Comment: denied alcohol   Past Medical History:  Diagnosis Date  . ADD (attention deficit disorder)    on Adderal  . Aggressive behavior of adult   . Allergy   . Anxiety   . Arthritis   . Asthma    as a child  . Cellulitis of breast 11/2013   RIGHT BREAST  . Chronic fatigue and immune dysfunction syndrome   . Chronic kidney disease    due to infection from stone- no regular visits- fine now  . Chronic pain    goes to [facility] for pain control  . Cold sore   . 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 Depression    takes meds daily  . Family history of malignant neoplasm of breast   . Fibromyalgia   . Nephrolithiasis   . Osteoporosis   . Other specified rheumatoid arthritis, right shoulder (HCC) 08/01/2011  . Post-nasal drip    hx of  . Rheumatoid arthritis(714.0)    autoimmune arthritis; methotrexate once/week  . Urinary incontinence      Past Surgical History:  Procedure Laterality Date  . ANTERIOR CERVICAL DECOMP/DISCECTOMY FUSION N/A 03/15/2015   Procedure: Cervical five-six, Cerival six-seven, Anterior Cervical Discectomy and Fusion, Allograft and Plate;  Surgeon: Eldred Manges, MD;  Location: [facility] OR;  Service: Orthopedics;  Laterality: N/A;  . BACK SURGERY  2011,16   lower back fusion l4-5, s1  . BLADDER SURGERY  2009  . BREAST ENHANCEMENT SURGERY  2003  . breast implants removed    . BREAST SURGERY  10/2013   REMOVAL OF BREAST IMPLANTS  . INCISION AND DRAINAGE ABSCESS Right 01/16/2014   Procedure: INCISION AND DRAINAGE AND OF RIGHT BREAST ABCESS;  Surgeon: Glenna Fellows, MD;  Location: [facility] ORS;  Service: General;  Laterality: Right;  . LIPOSUCTION  2003   abdominal  . LUMBAR LAMINECTOMY/DECOMPRESSION MICRODISCECTOMY N/A  12/10/2015   Procedure: Right L3-4 Hemilaminectomy, Excision of herniated nucleus pulposus;  Surgeon: Eldred Manges, MD;  Location: [facility] OR;  Service: Orthopedics;  Laterality: N/A;  . MASS EXCISION  11/03/2011   Procedure: MINOR EXCISION OF MASS;  Surgeon: Wyn Forster., MD;  Location: [facility];  Service: Orthopedics;  Laterality: Left;  debride IP joint, cyst excision left index  . SHOULDER SURGERY  2012   right  . TONSILLECTOMY  1987  . TOTAL SHOULDER ARTHROPLASTY  08/01/2011   Procedure: TOTAL SHOULDER ARTHROPLASTY;  Surgeon: Eulas Post, MD;  Location: [facility] OR;  Service: Orthopedics;  Laterality: Right;  Right total shoulder arthroplasty  . TUBAL LIGATION  1988   Family History  Problem Relation Age of Onset  . Arthritis Mother   . Heart disease Mother     ?psvt  . Breast cancer Mother 44    TAH/BSO  . Cancer Mother   . Mental illness Mother   . COPD Father   . Hypertension Father   . Alcohol abuse Father   . Mental illness Father   . Heart disease Father   . Healthy Daughter   . Breast cancer Maternal Aunt 27    deceased  . Cancer Cousin 86    female; unknown  primary  . Colon cancer Paternal Aunt 68    deceased at 59  . Stomach cancer Paternal Uncle 49    deceased at 20  . Alcohol abuse Brother   . Cancer Brother   . Hodgkin's lymphoma Brother   . Mental illness Brother   . HIV Brother   . Healthy Brother   . Healthy Son      Medication List       Accurate as of 12/22/15  3:59 PM. Always use your most recent med list.          ADDERALL XR 30 MG 24 hr capsule Generic drug:  amphetamine-dextroamphetamine Take 30 mg by mouth 2 (two) times daily.   ALPRAZolam 1 MG tablet Commonly known as:  XANAX Take 1 mg by mouth 2 (two) times daily as needed for anxiety.   ARIPiprazole 15 MG tablet Commonly known as:  ABILIFY   cyclobenzaprine 10 MG tablet Commonly known as:  FLEXERIL Take 1 tablet (10 mg total) by mouth 3 (three) times daily as needed for muscle spasms.   meloxicam 15 MG tablet Commonly known as:  MOBIC Take 1 tablet (15 mg total) by mouth daily.   methocarbamol 500 MG tablet Commonly known as:  ROBAXIN   morphine 15 MG 12 hr tablet Commonly known as:  MS CONTIN Take 1 tablet (15 mg total) by mouth every 12 (twelve) hours as needed for pain.   mupirocin ointment 2 % Commonly known as:  BACTROBAN   oxyCODONE-acetaminophen 10-325 MG tablet Commonly known as:  PERCOCET Take 1 tablet by mouth every 6 (six) hours as needed for pain.   tretinoin 0.1 % cream Commonly known as:  RETIN-A Apply 1 application topically at bedtime. 90 Day Supply   venlafaxine XR 75 MG 24 hr capsule Commonly known as:  EFFEXOR-XR Take 75 mg by mouth daily with breakfast.   zolpidem 10 MG tablet Commonly known as:  AMBIEN TAKE 1 TABLET (10 mg) BY MOUTH AT BEDTIME AS NEEDED for sleep       No results found for this or any previous visit (from the past 24 hour(s)). No results found.   ROS: Negative, with the exception of above  mentioned in HPI   Objective:  BP 130/85 (BP Location: Right Arm, Patient Position: Sitting, Cuff  Size: Normal)   Pulse 78   Temp 98 F (36.7 C)   Resp 20   Ht 5\' 4"  (1.626 m)   Wt 160 lb 8 oz (72.8 kg)   SpO2 97%   BMI 27.55 kg/m  Body mass index is 27.55 kg/m. Gen: Afebrile. No acute distress. Nontoxic in appearance, well developed, well nourished.  HENT: AT. [place]. MMM. Eyes:Pupils Equal Round Reactive to light, Extraocular movements intact,  Conjunctiva without redness, discharge or icterus. Neck/lymp/endocrine: Supple,no lymphadenopathy CV: RRR, no edema Chest: CTAB, no wheeze or crackles. Good air movement, normal resp effort.  Abd: Soft. NTND. BS present.  Skin: Lumbar spine vertical incision intact, erythema surrounding incision > R lower incision. Mild puckering of incision line with mild green drainage on dressing.   Neuro: Normal gait. PERLA. EOMi. Alert. Oriented x3  Psych: upset, tearful, angry. Normal dress. Normal thought content and judgment.  Assessment/Plan: Jisell Arrue is a 60 y.o. female present TCM hospital follow up.  History of recent hospitalization - appears to have been unintentional OD. Pt is upset, tearful and states she is tired of being in pain. She reports she must have accidentally taken all the muscle relaxer and pain meds together without realizing, but she was fine. She has not followed up with her surgeon as of yet, as stated on her hospital discharge papers.  - Dr. Ophelia Charter office contacted today by this office and an appt was made for the patient to be seen tomorrow morning in the [facility] office. Pt is agreeable to this appt and aware of location.  - Incision is painful with erythema, pt reports chills, mild drainage noted. Will treat with Augmentin and pt to follow with surgeon.  - prediabetes: pt has known prediabetes, it has actually improved mildly 6.3--> 5.8. Will continue to follow q 6 months with a1c. Pt aware of dietary and exercise recommendations.  - pt to follow with psych for all psychiatric conditions/medications as prior.  - pt to follow  with ortho and/or pain med for all pain medications and pain treatment.   F/U q 6 months on prediabetes.    electronically signed by:  Felix Pacini, DO  [facility] - OR

## 2015-12-22 NOTE — Patient Instructions (Addendum)
I have called in an antibiotic for you to start today.  Keep incision clean and dry, covered. Clean with warm soapy water only. No bath tubs, just showers until completely healed.  We are scheduling you an appt with your surgeon Dr. Ophelia Charter. Check at the front desk before you leave to get appt schedule

## 2015-12-31 ENCOUNTER — Encounter: Payer: Self-pay | Admitting: *Deleted

## 2015-12-31 ENCOUNTER — Ambulatory Visit: Payer: Managed Care, Other (non HMO) | Admitting: Cardiovascular Disease

## 2016-01-04 ENCOUNTER — Ambulatory Visit (INDEPENDENT_AMBULATORY_CARE_PROVIDER_SITE_OTHER): Payer: Managed Care, Other (non HMO) | Admitting: Cardiovascular Disease

## 2016-01-04 ENCOUNTER — Encounter: Payer: Self-pay | Admitting: Cardiovascular Disease

## 2016-01-04 DIAGNOSIS — I341 Nonrheumatic mitral (valve) prolapse: Secondary | ICD-10-CM | POA: Diagnosis not present

## 2016-01-04 DIAGNOSIS — R0789 Other chest pain: Secondary | ICD-10-CM

## 2016-01-04 NOTE — Progress Notes (Signed)
01/04/2016 Kara [name]   05-10-1955  [id number]  Primary Physician Felix Pacini, DO Primary Cardiologist: Runell Gess MD Roseanne Reno  HPI:  [name] is a 59 year old mildly overweight Caucasian female mother of 4 children who is disabled because of multiple orthopedic issues. I last saw her in the office 12/01/15. She has no cardiac risk factors other than family history. Her father did have a myocardial infarction and bypass surgery. She has never had a heart attack or stroke. Her other problems include history of rheumatoid arthritis, chronic fatigue syndrome and fibromyalgia. She has cervical disc fusion by Dr. Ophelia Charter December 2016. There is also question of mitral valve prolapse in the past. She does complain of chest pain with bilateral neck radiation with activity. I performed pharmacologic Myoview stress testing which was essentially normal except for mild elevation in 3 times a day ratio 1.31. 2-D echo was normal as well without evidence of mitral valve prolapse.   Current Outpatient Prescriptions  Medication Sig Dispense Refill  . ADDERALL XR 30 MG 24 hr capsule Take 30 mg by mouth 2 (two) times daily.     [name] ALPRAZolam (XANAX) 1 MG tablet Take 1 mg by mouth 2 (two) times daily as needed for anxiety.     . meloxicam (MOBIC) 15 MG tablet Take 1 tablet (15 mg total) by mouth daily. 90 tablet 3  . tretinoin (RETIN-A) 0.1 % cream Apply 1 application topically at bedtime. 90 Day Supply 45 g 1  . venlafaxine XR (EFFEXOR-XR) 75 MG 24 hr capsule Take 75 mg by mouth daily with breakfast.    . zolpidem (AMBIEN) 10 MG tablet TAKE 1 TABLET (10 mg) BY MOUTH AT BEDTIME AS NEEDED for sleep  3   No current facility-administered medications for this visit.     Allergies  Allergen Reactions  . Aspirin Anaphylaxis  . Ibuprofen Anaphylaxis  . Ativan [Lorazepam] Other (See Comments)    Makes agitated, combative   . Ketamine Other (See Comments)    Hallucinations  . Toradol  [Ketorolac Tromethamine] Itching, Swelling and Rash    UNSPECIFIED REACTION   . Tramadol Hcl Itching, Swelling and Rash    SWELLING REACTION UNSPECIFIED     Social History   Social History  . Marital status: Married    Spouse name: N/A  . Number of children: N/A  . Years of education: N/A   Occupational History  . Not on file.   Social History Main Topics  . Smoking status: Former Smoker    Packs/day: 0.50    Years: 10.00    Types: Cigarettes    Quit date: 12/06/2005  . Smokeless tobacco: Never Used     Comment: smoked for 10 years  . Alcohol use No     Comment: denied alcohol  . Drug use: No     Comment: denied any drug use with admission nurse  . Sexual activity: Yes    Birth control/ protection: Post-menopausal   Other Topics Concern  . Not on file   Social History Narrative   Married, Charnette Younkin.    6 children.    [employer], Retired Child psychotherapist.    Denies alcohol, tobacco or drug use.   Drinks caffeinated beverages. Uses herbal remedies. Take a daily vitamin.   Wears her seatbelt. Exercises greater than 3 times a week.   Smoke detector in the home, firearms in the home in a locked cabinet, feels safe in her relationships.  Review of Systems: General: negative for chills, fever, night sweats or weight changes.  Cardiovascular: negative for chest pain, dyspnea on exertion, edema, orthopnea, palpitations, paroxysmal nocturnal dyspnea or shortness of breath Dermatological: negative for rash Respiratory: negative for cough or wheezing Urologic: negative for hematuria Abdominal: negative for nausea, vomiting, diarrhea, bright red blood per rectum, melena, or hematemesis Neurologic: negative for visual changes, syncope, or dizziness All other systems reviewed and are otherwise negative except as noted above.    Blood pressure (!) 140/100, pulse 81, height 5\' 4"  (1.626 m), weight 157 lb (71.2 kg).  General appearance: alert and no distress Neck: no adenopathy, no  carotid bruit, no JVD, supple, symmetrical, trachea midline and thyroid not enlarged, symmetric, no tenderness/mass/nodules Lungs: clear to auscultation bilaterally Heart: regular rate and rhythm, S1, S2 normal, no murmur, click, rub or gallop Extremities: extremities normal, atraumatic, no cyanosis or edema  EKG normal sinus rhythm at 81 without ST or T-wave changes. I personally reviewed this EKG  ASSESSMENT AND PLAN:   Atypical chest pain History of atypical chest pain with recent Myoview performed 12/02/15 that showed mild elevation of 3 times a day ratio 1.31 with normal perfusion. I've reassured her that the likelihood that her chest pain is ischemically mediated this small  Elevated BP History of hypertension not on antihypertensive medications which she attributes to her Adderall. She says her blood pressures higher in the morning but gets better throughout the day.  Mitral valve prolapse History of mitral valve prolapse in the past with recent 2-D echocardiogram performed 12/03/15 which was entirely normal. Specifically, there was no comment about mitral valve prolapse.      02/02/16 MD FACP,FACC,FAHA, [facility] 01/04/2016 9:46 AM

## 2016-01-04 NOTE — Assessment & Plan Note (Signed)
History of atypical chest pain with recent Myoview performed 12/02/15 that showed mild elevation of 3 times a day ratio 1.31 with normal perfusion. I've reassured her that the likelihood that her chest pain is ischemically mediated this small

## 2016-01-04 NOTE — Patient Instructions (Signed)
Medication Instructions:  NO CHANGES.   Follow-Up: Your physician wants you to follow-up in: 12 MONTHS WITH DR BERRY.  You will receive a reminder letter in the mail two months in advance. If you don't receive a letter, please call our office to schedule the follow-up appointment.   If you need a refill on your cardiac medications before your next appointment, please call your pharmacy.   

## 2016-01-04 NOTE — Assessment & Plan Note (Signed)
History of hypertension not on antihypertensive medications which she attributes to her Adderall. She says her blood pressures higher in the morning but gets better throughout the day.

## 2016-01-04 NOTE — Assessment & Plan Note (Signed)
History of mitral valve prolapse in the past with recent 2-D echocardiogram performed 12/03/15 which was entirely normal. Specifically, there was no comment about mitral valve prolapse.

## 2016-01-11 ENCOUNTER — Inpatient Hospital Stay (INDEPENDENT_AMBULATORY_CARE_PROVIDER_SITE_OTHER): Payer: Managed Care, Other (non HMO) | Admitting: Orthopaedic Surgery

## 2016-01-11 DIAGNOSIS — M48061 Spinal stenosis, lumbar region without neurogenic claudication: Secondary | ICD-10-CM

## 2016-01-11 DIAGNOSIS — M5126 Other intervertebral disc displacement, lumbar region: Secondary | ICD-10-CM

## 2016-01-13 ENCOUNTER — Telehealth: Payer: Self-pay | Admitting: Family Medicine

## 2016-01-13 MED ORDER — VALACYCLOVIR HCL 1 G PO TABS
ORAL_TABLET | ORAL | 1 refills | Status: DC
Start: 2016-01-13 — End: 2016-07-10

## 2016-01-13 NOTE — Telephone Encounter (Signed)
Patient requesting valtrex.  Patient is beginning to get a cold sore, states that these become very bad if she does not begin valtrex.  Patient states that she will contact [facility] to have them send a refill request because patient did not know MG of medication.  Also, patient now requesting higher dose of meloxicam.  I told patient she may need to come into the office to follow up if she felt she needed a higher dose of medication.  Please contact patient.

## 2016-01-13 NOTE — Telephone Encounter (Signed)
Please call pt: Unfortunately she is on the highest Mg of the mobic/meloxicam.  I have called in her valtrex.

## 2016-01-13 NOTE — Telephone Encounter (Signed)
Spoke with patient reviewed information. Patient verbalized understanding 

## 2016-02-04 ENCOUNTER — Ambulatory Visit (INDEPENDENT_AMBULATORY_CARE_PROVIDER_SITE_OTHER): Payer: Managed Care, Other (non HMO) | Admitting: Orthopaedic Surgery

## 2016-02-09 ENCOUNTER — Other Ambulatory Visit (INDEPENDENT_AMBULATORY_CARE_PROVIDER_SITE_OTHER): Payer: Self-pay | Admitting: Orthopedic Surgery

## 2016-02-10 NOTE — Telephone Encounter (Signed)
Do you want to refill this ? 

## 2016-02-11 ENCOUNTER — Ambulatory Visit (INDEPENDENT_AMBULATORY_CARE_PROVIDER_SITE_OTHER): Payer: Managed Care, Other (non HMO) | Admitting: Orthopaedic Surgery

## 2016-02-11 NOTE — Telephone Encounter (Signed)
OK refill thanks 

## 2016-02-11 NOTE — Telephone Encounter (Signed)
Ok refill. I did this yesterday , looks like a repeat, just make sure it went in,  thanks

## 2016-02-11 NOTE — Telephone Encounter (Signed)
Called to pharmacy 

## 2016-02-21 ENCOUNTER — Telehealth (INDEPENDENT_AMBULATORY_CARE_PROVIDER_SITE_OTHER): Payer: Self-pay | Admitting: Orthopaedic Surgery

## 2016-02-21 NOTE — Telephone Encounter (Signed)
Kara [name] called saying she wants to see Dr. Corliss Skains and was told by their office that Dr. Ophelia Charter needs to send a referral for her. She's scheduled for a New Patient appointment to see Dr. Roda Shutters this Thursday but said she's seen Dr. Ophelia Charter before and he's familiar with her history. Please give her a phone call regarding this if needed.  Pt's ph# [phone]  Thank you.

## 2016-02-22 NOTE — Telephone Encounter (Signed)
She's felt medication. I do not see a reason for rheumatology referral .

## 2016-02-22 NOTE — Telephone Encounter (Signed)
Ok for referral to Dr. Corliss Skains?

## 2016-02-22 NOTE — Telephone Encounter (Signed)
Reviewed. Let her know Dr. Algis Downs  Declined to see her.

## 2016-02-22 NOTE — Telephone Encounter (Signed)
ER note and recent hospitalization when patient was found face down on the floor with altered mental status after polypharmacy. She had been started on MS Contin by another provider apparently took muscle relaxants Flexeril, other by mouth narcotics all at once. She had called requesting Flexeril at this office and I refused this request due to polypharmacy abuse. Okay for referral to Dr. Corliss Skains.

## 2016-02-23 NOTE — Telephone Encounter (Signed)
I called patient and advised. She is going to find her own rheumatologist.

## 2016-02-24 ENCOUNTER — Encounter (INDEPENDENT_AMBULATORY_CARE_PROVIDER_SITE_OTHER): Payer: Self-pay | Admitting: Orthopaedic Surgery

## 2016-02-24 ENCOUNTER — Ambulatory Visit (INDEPENDENT_AMBULATORY_CARE_PROVIDER_SITE_OTHER): Payer: Managed Care, Other (non HMO) | Admitting: Orthopaedic Surgery

## 2016-02-24 DIAGNOSIS — M79641 Pain in right hand: Secondary | ICD-10-CM

## 2016-02-24 DIAGNOSIS — M79642 Pain in left hand: Secondary | ICD-10-CM | POA: Diagnosis not present

## 2016-02-24 MED ORDER — DICLOFENAC SODIUM 2 % TD SOLN: 2.0000 g | Freq: Four times a day (QID) | TRANSDERMAL | 5 refills | Status: DC | PRN

## 2016-02-24 MED ORDER — OXYCODONE HCL 5 MG PO TABS: 10.0000 mg | ORAL_TABLET | ORAL | 0 refills | Status: DC | PRN

## 2016-02-24 NOTE — Progress Notes (Signed)
Office Visit Note   Patient: Kara [name]           Date of Birth: 11/08/1955           MRN: [id number] Visit Date: 02/24/2016              Requested by: Natalia Leatherwood, DO [address] [place], [place] [address] PCP: Felix Pacini, DO   Assessment & Plan: Visit Diagnoses:  1. 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 She also has significant osteitis of the first metacarpal and chronic tenosynovitis of the FCR tendon. She has tried Voltaren gel, bracing, oral prednisone with partial relief. I gave her one time prescription for Percocet.  We will make a referral to Dr. Lajean Silvius for management of her rheumatoid arthritis as soon as possible. Follow up with me as needed.  Follow-Up Instructions: Return if symptoms worsen or fail to improve.   Orders:  Orders Placed This Encounter  Procedures  . Ambulatory referral to Rheumatology   Meds ordered this encounter  Medications  . Diclofenac Sodium (PENNSAID) 2 % SOLN    Sig: Place 2 g onto the skin 4 (four) times daily as needed.    Dispense:  1 Bottle    Refill:  5  . oxyCODONE (OXY IR/ROXICODONE) 5 MG immediate release tablet    Sig: Take 2 tablets (10 mg total) by mouth every 4 (four) hours as needed.    Dispense:  30 tablet    Refill:  0      Procedures: No procedures performed   Clinical Data: No additional findings.   Subjective: Chief Complaint  Patient presents with  . Right Hand - Pain  . Left Hand - Pain    Patient is a new patient to our practice with severe left hand pain. She does have diagnosis of torn arthritis. She is currently not not under management for her rheumatoid arthritis. She did not like her Humira injections but it did keep her rheumatoid arthritis at bay. She would like to get back into her rheumatologist to restart the infusions.  She did receive a CMC injection by Dr. Remigio Eisenmenger at [facility] which made the  pain worse. Her pain is at the thumb basal joint. MRI was also performed of the hand which showed significant osteitis and bony edema of the first metacarpal and CMC joint. She is tearful today describing her pain. She denies any constitutional symptoms.    Review of Systems  Constitutional: Negative.   HENT: Negative.   Eyes: Negative.   Respiratory: Negative.   Cardiovascular: Negative.   Endocrine: Negative.   Musculoskeletal: Negative.   Neurological: Negative.   Hematological: Negative.   Psychiatric/Behavioral: Negative.   All other systems reviewed and are negative.    Objective: Vital Signs: There were no vitals taken for this visit.  Physical Exam  Constitutional: She is oriented to person, place, and time. She appears well-developed and well-nourished.  HENT:  Head: Atraumatic.  Eyes: EOM are normal.  Neck: Neck supple.  Cardiovascular: Intact distal pulses.   Pulmonary/Chest: Effort normal.  Abdominal: Soft.  Neurological: She is alert and oriented to person, place, and time.  Skin: Skin is warm. Capillary refill takes less than 2 seconds.  Psychiatric: She has a normal mood and affect. Her behavior is normal. Judgment and thought content normal.  Nursing note and vitals reviewed.   Ortho Exam Exam  of the left hand shows no swelling. She is tender to palpation throughout her hand and wrist. Base of the first metacarpals very tender. Negative Finkelstein's. Positive grind test. No signs of infection. No subluxating tendon. Specialty Comments:  No specialty comments available.  Imaging: No results found.   PMFS History: Patient Active Problem List   Diagnosis Date Noted  . Mitral valve prolapse 01/04/2016  . Skin infection 12/22/2015  . History of recent hospitalization 12/22/2015  . Altered mental status 12/13/2015  . HNP (herniated nucleus pulposus), lumbar 12/10/2015  . Prediabetes 08/19/2015  . BMI 26.0-26.9,adult 08/18/2015  . Chronic pain  08/18/2015  . Vitamin D deficiency 08/18/2015  . Atypical chest pain 08/18/2015  . Elevated BP 08/18/2015  . Chronic fatigue disorder 06/14/2015  . Nail fungus 06/14/2015  . S/P cervical spinal fusion 03/15/2015  . Spinal surgery in prior 3 months 07/03/2014  . PTSD (post-traumatic stress disorder) 03/07/2014  . Severe recurrent major depression without psychotic features (HCC) 03/06/2014  . Suicide threat or attempt 03/06/2014  . Agitation 03/06/2014  . Aggressive behavior   . Family history of malignant neoplasm of breast   . Rheumatoid arthritis (HCC) 12/13/2011  . Fibromyalgia 12/13/2011   Past Medical History:  Diagnosis Date  . ADD (attention deficit disorder)    on Adderal  . Aggressive behavior of adult   . Allergy   . Anxiety   . Arthritis   . Asthma    as a child  . Cellulitis of breast 11/2013   RIGHT BREAST  . 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 Chronic kidney disease    due to infection from stone- no regular visits- fine now  . Chronic pain    goes to [facility] for pain control  . Cold sore   . 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 Depression    takes meds daily  . Family history of malignant neoplasm of breast   . Fibromyalgia   . Nephrolithiasis   . Osteoporosis   . Other specified rheumatoid arthritis, right shoulder (HCC) 08/01/2011  . Post-nasal drip    hx of  . Rheumatoid arthritis(714.0)    autoimmune arthritis; methotrexate once/week  . Urinary incontinence     Family History  Problem Relation Age of Onset  . Arthritis Mother   . Heart disease Mother     ?psvt  . Breast cancer Mother 67    TAH/BSO  . Cancer Mother   . Mental illness Mother   . COPD Father   . Hypertension Father   . Alcohol abuse Father   . Mental illness Father   . Heart disease Father   . Healthy Daughter   . Breast cancer Maternal Aunt 27    deceased  . Cancer Cousin 69    female;  unknown primary  . Colon cancer Paternal Aunt 44    deceased at 67  . Stomach cancer Paternal Uncle 60    deceased at 58  . Alcohol abuse Brother   . Cancer Brother   . Hodgkin's lymphoma Brother   . Mental illness Brother   . HIV Brother   . Healthy Brother   . Healthy Son     Past Surgical History:  Procedure Laterality Date  . ANTERIOR CERVICAL DECOMP/DISCECTOMY FUSION N/A 03/15/2015   Procedure: Cervical five-six, Cerival six-seven, Anterior Cervical Discectomy and Fusion, Allograft and Plate;  Surgeon: Eldred Manges, MD;  Location: [facility] OR;  Service: Orthopedics;  Laterality: N/A;  . BACK SURGERY  2011,16   lower back fusion l4-5, s1  . BLADDER SURGERY  2009  . BREAST ENHANCEMENT SURGERY  2003  . breast implants removed    . BREAST SURGERY  10/2013   REMOVAL OF BREAST IMPLANTS  . INCISION AND DRAINAGE ABSCESS Right 01/16/2014   Procedure: INCISION AND DRAINAGE AND OF RIGHT BREAST ABCESS;  Surgeon: Glenna Fellows, MD;  Location: [facility] ORS;  Service: General;  Laterality: Right;  . LIPOSUCTION  2003   abdominal  . LUMBAR LAMINECTOMY/DECOMPRESSION MICRODISCECTOMY N/A 12/10/2015   Procedure: Right L3-4 Hemilaminectomy, Excision of herniated nucleus pulposus;  Surgeon: Eldred Manges, MD;  Location: [facility] OR;  Service: Orthopedics;  Laterality: N/A;  . MASS EXCISION  11/03/2011   Procedure: MINOR EXCISION OF MASS;  Surgeon: Wyn Forster., MD;  Location: [facility];  Service: Orthopedics;  Laterality: Left;  debride IP joint, cyst excision left index  . SHOULDER SURGERY  2012   right  . TONSILLECTOMY  1987  . TOTAL SHOULDER ARTHROPLASTY  08/01/2011   Procedure: TOTAL SHOULDER ARTHROPLASTY;  Surgeon: Eulas Post, MD;  Location: [facility] OR;  Service: Orthopedics;  Laterality: Right;  Right total shoulder arthroplasty  . TUBAL LIGATION  1988   Social History   Occupational History  . Not on file.   Social History Main Topics  . Smoking status: Former Smoker     Packs/day: 0.50    Years: 10.00    Types: Cigarettes    Quit date: 12/06/2005  . Smokeless tobacco: Never Used     Comment: smoked for 10 years  . Alcohol use No     Comment: denied alcohol  . Drug use: No     Comment: denied any drug use with admission nurse  . Sexual activity: Yes    Birth control/ protection: Post-menopausal

## 2016-03-02 ENCOUNTER — Emergency Department (HOSPITAL_COMMUNITY)
Admission: EM | Admit: 2016-03-02 | Discharge: 2016-03-02 | Disposition: A | Discharge status: Home / Self Care | Payer: Managed Care, Other (non HMO) | Attending: Emergency Medicine | Admitting: Emergency Medicine

## 2016-03-02 ENCOUNTER — Other Ambulatory Visit: Payer: Self-pay | Admitting: *Deleted

## 2016-03-02 DIAGNOSIS — M25532 Pain in left wrist: Secondary | ICD-10-CM | POA: Diagnosis not present

## 2016-03-02 DIAGNOSIS — Z7982 Long term (current) use of aspirin: Secondary | ICD-10-CM | POA: Insufficient documentation

## 2016-03-02 DIAGNOSIS — N189 Chronic kidney disease, unspecified: Secondary | ICD-10-CM | POA: Diagnosis not present

## 2016-03-02 DIAGNOSIS — Z87891 Personal history of nicotine dependence: Secondary | ICD-10-CM | POA: Diagnosis not present

## 2016-03-02 DIAGNOSIS — F909 Attention-deficit hyperactivity disorder, unspecified type: Secondary | ICD-10-CM | POA: Diagnosis not present

## 2016-03-02 DIAGNOSIS — J45909 Unspecified asthma, uncomplicated: Secondary | ICD-10-CM | POA: Insufficient documentation

## 2016-03-02 DIAGNOSIS — G8929 Other chronic pain: Secondary | ICD-10-CM

## 2016-03-02 DIAGNOSIS — Z96611 Presence of right artificial shoulder joint: Secondary | ICD-10-CM | POA: Insufficient documentation

## 2016-03-02 MED ORDER — OXYCODONE-ACETAMINOPHEN 5-325 MG PO TABS
1.0000 | ORAL_TABLET | Freq: Once | ORAL | Status: AC
  Administered 2016-03-02: 1 via ORAL
  Filled 2016-03-02: qty 1

## 2016-03-02 NOTE — Progress Notes (Signed)
Orthopedic Tech Progress Note Patient Details:  Kara [name], Kara [name] [id number]  Ortho Devices Type of Ortho Device: Thumb velcro splint Ortho Device/Splint Interventions: Application   Kara [name] 03/02/2016, 7:55 AM

## 2016-03-02 NOTE — ED Notes (Signed)
Paged ortho tech for thumb spica

## 2016-03-02 NOTE — ED Triage Notes (Signed)
Patient has chronic left wrist pain and has been following up with a Rheumatologist. Patient has not seen a pain specialist. Patient is tearful and stating this pain is recurrent and feels like something is popping inside her wrist. Patient has full movement and sensation.

## 2016-03-02 NOTE — ED Provider Notes (Signed)
[facility] Provider Note   CSN: [id number] Arrival date & time: 03/02/16  4235     History   Chief Complaint Chief Complaint  Patient presents with  . Wrist Pain    HPI Kara [name] is a 60 y.o. female.  HPI Hx of RA with recurrent/chronic pain of the left thumb and wrist. Following with orthopedics (Dr Roda Shutters) and recently trying to reestablish with rheumatology. Presents to today with acute pain in the left wrist and thumb. She feels like "something popped".  She had been alternating heat and ice and taking oxycodone to try to manage the pain. She is tearful and reports this is the worse pain she has had. Dr Roda Shutters currently has her on a one time prescription of oxycodone. She is no longer seeing her pain specialist. She tells me she doesn't know what to do as the pain has become so bad. No other injury or trauma today.  No fevers or chills.  No numbness.  She can move all 5 fingers on her left hand.  Her pain is severe in severity   Past Medical History:  Diagnosis Date  . ADD (attention deficit disorder)    on Adderal  . Aggressive behavior of adult   . Allergy   . Anxiety   . Arthritis   . Asthma    as a child  . Cellulitis of breast 11/2013   RIGHT BREAST  . 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 Chronic kidney disease    due to infection from stone- no regular visits- fine now  . Chronic pain    [facility]  . Cold sore   . 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 Depression    takes meds daily  . Family history of malignant neoplasm of breast   . Fibromyalgia   . Nephrolithiasis   . Osteoporosis   . Other specified rheumatoid arthritis, right shoulder (HCC) 08/01/2011  . Post-nasal drip    hx of  . Rheumatoid arthritis(714.0)    autoimmune arthritis; methotrexate once/week  . Urinary incontinence     Patient Active Problem List   Diagnosis Date Noted  . Mitral  valve prolapse 01/04/2016  . Skin infection 12/22/2015  . History of recent hospitalization 12/22/2015  . Altered mental status 12/13/2015  . HNP (herniated nucleus pulposus), lumbar 12/10/2015  . Prediabetes 08/19/2015  . BMI 26.0-26.9,adult 08/18/2015  . Chronic pain 08/18/2015  . Vitamin D deficiency 08/18/2015  . Atypical chest pain 08/18/2015  . Elevated BP 08/18/2015  . Chronic fatigue disorder 06/14/2015  . Nail fungus 06/14/2015  . S/P cervical spinal fusion 03/15/2015  . Spinal surgery in prior 3 months 07/03/2014  . PTSD (post-traumatic stress disorder) 03/07/2014  . Severe recurrent major depression without psychotic features (HCC) 03/06/2014  . Suicide threat or attempt 03/06/2014  . Agitation 03/06/2014  . Aggressive behavior   . Family history of malignant neoplasm of breast   . Rheumatoid arthritis (HCC) 12/13/2011  . Fibromyalgia 12/13/2011    Past Surgical History:  Procedure Laterality Date  . ANTERIOR CERVICAL DECOMP/DISCECTOMY FUSION N/A 03/15/2015   Procedure: Cervical five-six, Cerival six-seven, Anterior Cervical Discectomy and Fusion, Allograft and Plate;  Surgeon: Eldred Manges, MD;  Location: [facility] OR;  Service: Orthopedics;  Laterality: N/A;  . BACK SURGERY  2011,16   lower back fusion l4-5, s1  . BLADDER SURGERY  2009  .  BREAST ENHANCEMENT SURGERY  2003  . breast implants removed    . BREAST SURGERY  10/2013   REMOVAL OF BREAST IMPLANTS  . INCISION AND DRAINAGE ABSCESS Right 01/16/2014   Procedure: INCISION AND DRAINAGE AND OF RIGHT BREAST ABCESS;  Surgeon: Glenna Fellows, MD;  Location: [facility];  Service: General;  Laterality: Right;  . LIPOSUCTION  2003   abdominal  . LUMBAR LAMINECTOMY/DECOMPRESSION MICRODISCECTOMY N/A 12/10/2015   Procedure: Right L3-4 Hemilaminectomy, Excision of herniated nucleus pulposus;  Surgeon: Eldred Manges, MD;  Location[facility];  Service: Orthopedics;  Laterality: N/A;  . MASS EXCISION  11/03/2011   Procedure: MINOR  EXCISION OF MASS;  Surgeon: Wyn Forster., MD;  Location: [facility];  Service: Orthopedics;  Laterality: Left;  debride IP joint, cyst excision left index  . SHOULDER SURGERY  2012   right  . TONSILLECTOMY  1987  . TOTAL SHOULDER ARTHROPLASTY  08/01/2011   Procedure: TOTAL SHOULDER ARTHROPLASTY;  Surgeon: Eulas Post, MD;  Location: [facility] OR;  Service: Orthopedics;  Laterality: Right;  Right total shoulder arthroplasty  . TUBAL LIGATION  1988    OB History    No data available       Home Medications    Prior to Admission medications   Medication Sig Start Date End Date Taking? Authorizing Provider  ADDERALL XR 30 MG 24 hr capsule Take 30 mg by mouth 2 (two) times daily.  11/02/15   Historical Provider, MD  ALPRAZolam Prudy Feeler) 1 MG tablet Take 1 mg by mouth 2 (two) times daily as needed for anxiety.  07/06/14   Historical Provider, MD  cyclobenzaprine (FLEXERIL) 10 MG tablet TAKE 1 TABLET (10 MG TOTAL) BY MOUTH 3 (THREE) TIMES DAILY AS NEEDED FOR MUSCLE SPASMS. 02/11/16   Eldred Manges, MD  Diclofenac Sodium (PENNSAID) 2 % SOLN Place 2 g onto the skin 4 (four) times daily as needed. 02/24/16   Tarry Kos, MD  meloxicam (MOBIC) 15 MG tablet Take 1 tablet (15 mg total) by mouth daily. 12/16/15   Renee A Kuneff, DO  oxyCODONE (OXY IR/ROXICODONE) 5 MG immediate release tablet Take 2 tablets (10 mg total) by mouth every 4 (four) hours as needed. 02/24/16   Tarry Kos, MD  tretinoin (RETIN-A) 0.1 % cream Apply 1 application topically at bedtime. 90 Day Supply 08/18/15   Renee A Kuneff, DO  valACYclovir (VALTREX) 1000 MG tablet Take two tablets onset of coldsore and repeat 2 tablets in 12 hours Patient not taking: Reported on 02/24/2016 01/13/16   Renee A Kuneff, DO  venlafaxine XR (EFFEXOR-XR) 75 MG 24 hr capsule Take 75 mg by mouth daily with breakfast.    Historical Provider, MD  zolpidem (AMBIEN) 10 MG tablet TAKE 1 TABLET (10 mg) BY MOUTH AT BEDTIME AS NEEDED for sleep  05/23/15   Historical Provider, MD    Family History Family History  Problem Relation Age of Onset  . Arthritis Mother   . Heart disease Mother     ?psvt  . Breast cancer Mother 24    TAH/BSO  . Cancer Mother   . Mental illness Mother   . COPD Father   . Hypertension Father   . Alcohol abuse Father   . Mental illness Father   . Heart disease Father   . Healthy Daughter   . Breast cancer Maternal Aunt 27    deceased  . Cancer Cousin 58    female; unknown primary  . Colon  cancer Paternal Aunt 90    deceased at 84  . Stomach cancer Paternal Uncle 90    deceased at 61  . Alcohol abuse Brother   . Cancer Brother   . Hodgkin's lymphoma Brother   . Mental illness Brother   . HIV Brother   . Healthy Brother   . Healthy Son     Social History Social History  Substance Use Topics  . Smoking status: Former Smoker    Packs/day: 0.50    Years: 10.00    Types: Cigarettes    Quit date: 12/06/2005  . Smokeless tobacco: Never Used     Comment: smoked for 10 years  . Alcohol use No     Comment: denied alcohol     Allergies   Aspirin; Ibuprofen; Ativan [lorazepam]; Ketamine; Toradol [ketorolac tromethamine]; and Tramadol hcl   Review of Systems Review of Systems  All other systems reviewed and are negative.    Physical Exam Updated Vital Signs Ht 5\' 6"  (1.676 m)   Wt 155 lb (70.3 kg)   BMI 25.02 kg/m   Physical Exam  Constitutional: She is oriented to person, place, and time. She appears well-developed and well-nourished.  HENT:  Head: Normocephalic.  Eyes: EOM are normal.  Neck: Normal range of motion.  Pulmonary/Chest: Effort normal.  Abdominal: She exhibits no distension.  Musculoskeletal:  Normal left radial pulse.  Normal flexion and extension of all 5 fingers on left hand.  Normal opposition of the left thumb to little finger.  No significant swelling of the left hand.  No rash the left hand noted.  Mild pain with range of motion of the left thumb and  left wrist.  Neurological: She is alert and oriented to person, place, and time.  Psychiatric: She has a normal mood and affect.  Nursing note and vitals reviewed.    ED Treatments / Results  Labs (all labs ordered are listed, but only abnormal results are displayed) Labs Reviewed - No data to display  EKG  EKG Interpretation None       Radiology No results found.  Procedures Procedures (including critical care time)  Medications Ordered in ED Medications  oxyCODONE-acetaminophen (PERCOCET/ROXICET) 5-325 MG per tablet 1 tablet (not administered)     Initial Impression / Assessment and Plan / ED Course  I have reviewed the triage vital signs and the nursing notes.  Pertinent labs & imaging results that were available during my care of the patient were reviewed by me and considered in my medical decision making (see chart for details).  Clinical Course     Acute on chronic pain.  Patient has been referred back to orthopedics and pain management.  I really think she needs a pain management specialist.  She did not like [facility] here in [place].  I've given her the information for [facility] in [place].  Thumb spica for comfort.  I do not think she is need of acute imaging today.  She is tearful.  I've asked her to continue following with her therapist as well to deal with the likely ongoing depression.  One Percocet given for pain today.  She tried 10 mg of oxycodone 2 hours ago and is still having pain.  She'll continue with her prescription from her orthopedist.  Final Clinical Impressions(s) / ED Diagnoses   Final diagnoses:  Acute pain of left wrist  Chronic pain of left wrist    New Prescriptions New Prescriptions   No medications on file  Azalia Bilis, MD 03/02/16 [phone]

## 2016-03-03 ENCOUNTER — Ambulatory Visit (INDEPENDENT_AMBULATORY_CARE_PROVIDER_SITE_OTHER): Payer: Managed Care, Other (non HMO) | Admitting: Orthopaedic Surgery

## 2016-03-06 LAB — HM MAMMOGRAPHY: HM MAMMO: NORMAL (ref 0–4)

## 2016-03-07 ENCOUNTER — Encounter: Payer: Self-pay | Admitting: Family Medicine

## 2016-04-04 ENCOUNTER — Other Ambulatory Visit: Payer: Self-pay | Admitting: *Deleted

## 2016-04-04 ENCOUNTER — Telehealth: Payer: Self-pay | Admitting: *Deleted

## 2016-04-04 NOTE — Telephone Encounter (Signed)
Received letter from patient's psychiatrist with medication changes for patient Effexor Xr 75mg  2 capsules daily in am, Adderall xr 30 mg 2 capsules in am and 1 later in the day daily, and xanax 1 mg po qid prn. Outside dispensing amount from pharmacy does not match this current dosing . Will update patient chart with dosing information at her next visit. Letter from Dr sent to scan.

## 2016-05-02 ENCOUNTER — Ambulatory Visit (INDEPENDENT_AMBULATORY_CARE_PROVIDER_SITE_OTHER): Payer: Managed Care, Other (non HMO) | Admitting: Orthopaedic Surgery

## 2016-05-02 ENCOUNTER — Ambulatory Visit (INDEPENDENT_AMBULATORY_CARE_PROVIDER_SITE_OTHER): Payer: Self-pay

## 2016-05-02 ENCOUNTER — Encounter (INDEPENDENT_AMBULATORY_CARE_PROVIDER_SITE_OTHER): Payer: Self-pay | Admitting: Orthopaedic Surgery

## 2016-05-02 VITALS — Ht 64.0 in | Wt 157.0 lb

## 2016-05-02 DIAGNOSIS — M960 Pseudarthrosis after fusion or arthrodesis: Secondary | ICD-10-CM | POA: Diagnosis not present

## 2016-05-02 DIAGNOSIS — M25511 Pain in right shoulder: Secondary | ICD-10-CM

## 2016-05-02 DIAGNOSIS — M79601 Pain in right arm: Secondary | ICD-10-CM

## 2016-05-02 DIAGNOSIS — M542 Cervicalgia: Secondary | ICD-10-CM

## 2016-05-02 MED ORDER — DICLOFENAC SODIUM 75 MG PO TBEC: 75.0000 mg | DELAYED_RELEASE_TABLET | Freq: Two times a day (BID) | ORAL | 1 refills | Status: DC

## 2016-05-02 NOTE — Progress Notes (Addendum)
Office Visit Note   Patient: Kara [name]           Date of Birth: [birth year]           MRN: [id number] Visit Date: 05/02/2016              Requested by: Natalia Leatherwood, DO [address] [place], [place] [address] PCP: Felix Pacini, DO   Assessment & Plan: Visit Diagnoses:  1. Neck pain   2. Right arm pain   3. Pseudarthrosis after fusion or arthrodesis     Plan: We will proceed with CT scan cervical spine based on the plain radiographs that suggest pseudoarthrosis at the C5-6 level. C6-7 level appears to be Incorporated. She has slight lucency around the screws at C5 and appears to have a pseudoarthrosis line that goes inferior to the C5-6 graft where  There is incomplete incorporation. Patient asked about facet injections the lumbar spine at L3-4 level which is just above her fusion which is solid from L4-S1. I recommend she defer that until after treatment for pseudoarthrosis. She was referred to pain clinic in [place]. We discussed the long history and massive amounts of total narcotic she's had over the years. She expresses a desire to avoid narcotic medication. She requested trying some Voltaren to help with inflammation. She's off the Mobic will send this in. Office follow-up after cervical CT scan to evaluate pseudoarthrosis. Follow-Up Instructions: No Follow-up on file.   Orders:  Orders Placed This Encounter  Procedures  . XR Cervical Spine 2 or 3 views  . XR Shoulder Right   No orders of the defined types were placed in this encounter.     Procedures: No procedures performed   Clinical Data: No additional findings.   Subjective: Chief Complaint  Patient presents with  . Neck - Pain  . Right Shoulder - Pain    Patient presents with right arm pain and numbness. She had a C5-6, C6-7 ACD&F on 03/15/2015. She has also had right total shoulder in 2009 or so by Dr. Dion Saucier. She describes pain and pressure in her right humerus. Her forearm and hand on the right get  numb. She states that she has to raise her arm and try to shake it out to get the feeling back in her hand. She states these are the same symptoms that she had prior to the cervical fusion. She has gone back to her rheumatologist and started a med like Humira. She does not know if the pain is due to her shoulder or neck.     Review of Systems  Genitourinary: Positive for difficulty urinating.     Objective: Vital Signs: Ht 5\' 4"  (1.626 m)   Wt 157 lb (71.2 kg)   BMI 26.95 kg/m   Physical Exam  Constitutional: She is oriented to person, place, and time. She appears well-developed.  HENT:  Head: Normocephalic.  Right Ear: External ear normal.  Left Ear: External ear normal.  Eyes: Pupils are equal, round, and reactive to light.  Neck: No tracheal deviation present. No thyromegaly present.  Cardiovascular: Normal rate.   Pulmonary/Chest: Effort normal.  Abdominal: Soft.  Musculoskeletal:  Well-healed anterior cervical incision midline lumbar incision. She is able to ambulate. She complains of pain at the lumbosacral junction slightly more on the right than left. Upper enlarged 70 reflexes are the intact at the knee biceps triceps brachioradialis. Decreased ankle jerk bilaterally.  Neurological: She is alert and oriented to person, place, and time.  Skin: Skin is warm and dry.  Psychiatric: She has a normal mood and affect. Her behavior is normal.    Ortho Exam tenderness of the CMC joint bilaterally. Positive grind test. They will impingement of the shoulders. Right shoulder she is able get her arm rapidly directly overhead without complaints of pain or discomfort.  Specialty Comments:  No specialty comments available.  Imaging: No results found.   PMFS History: Patient Active Problem List   Diagnosis Date Noted  . Mitral valve prolapse 01/04/2016  . Skin infection 12/22/2015  . History of recent hospitalization 12/22/2015  . Altered mental status 12/13/2015  . HNP  (herniated nucleus pulposus), lumbar 12/10/2015  . Prediabetes 08/19/2015  . BMI 26.0-26.9,adult 08/18/2015  . Chronic pain 08/18/2015  . Vitamin D deficiency 08/18/2015  . Atypical chest pain 08/18/2015  . Elevated BP 08/18/2015  . Chronic fatigue disorder 06/14/2015  . Nail fungus 06/14/2015  . S/P cervical spinal fusion 03/15/2015  . Spinal surgery in prior 3 months 07/03/2014  . PTSD (post-traumatic stress disorder) 03/07/2014  . Severe recurrent major depression without psychotic features (HCC) 03/06/2014  . Suicide threat or attempt 03/06/2014  . Agitation 03/06/2014  . Aggressive behavior   . Family history of malignant neoplasm of breast   . Rheumatoid arthritis (HCC) 12/13/2011  . Fibromyalgia 12/13/2011   Past Medical History:  Diagnosis Date  . ADD (attention deficit disorder)    on Adderal  . Aggressive behavior of adult   . Allergy   . Anxiety   . Arthritis   . Asthma    as a child  . Cellulitis of breast 11/2013   RIGHT BREAST  . 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 Chronic kidney disease    due to infection from stone- no regular visits- fine now  . Chronic pain    [facility]es to [facility]  . Cold sore   . 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 Depression    takes meds daily  . Family history of malignant neoplasm of breast   . Fibromyalgia   . Nephrolithiasis   . Osteoporosis   . Other specified rheumatoid arthritis, right shoulder (HCC) 08/01/2011  . Post-nasal drip    hx of  . Rheumatoid arthritis(714.0)    autoimmune arthritis; methotrexate once/week  . Urinary incontinence     Family History  Problem Relation Age of Onset  . Arthritis Mother   . Heart disease Mother     ?psvt  . Breast cancer Mother 52    TAH/BSO  . Cancer Mother   . Mental illness Mother   . COPD Father   . Hypertension Father   . Alcohol abuse Father   . Mental illness Father   .  Heart disease Father   . Healthy Daughter   . Breast cancer Maternal Aunt 27    deceased  . Cancer Cousin 77    female; unknown primary  . Colon cancer Paternal Aunt 48    deceased at 51  . Stomach cancer Paternal Uncle 12    deceased at 15  . Alcohol abuse Brother   . Cancer Brother   . Hodgkin's lymphoma Brother   . Mental illness Brother   . HIV Brother   . Healthy Brother   . Healthy Son     Past Surgical History:  Procedure Laterality Date  . ANTERIOR CERVICAL DECOMP/DISCECTOMY FUSION N/A 03/15/2015  Procedure: Cervical five-six, Cerival six-seven, Anterior Cervical Discectomy and Fusion, Allograft and Plate;  Surgeon: Eldred Manges, MD;  Location: [facility];  Service: Orthopedics;  Laterality: N/A;  . BACK SURGERY  2011,16   lower back fusion l4-5, s1  . BLADDER SURGERY  2009  . BREAST ENHANCEMENT SURGERY  2003  . breast implants removed    . BREAST SURGERY  10/2013   REMOVAL OF BREAST IMPLANTS  . INCISION AND DRAINAGE ABSCESS Right 01/16/2014   Procedure: INCISION AND DRAINAGE AND OF RIGHT BREAST ABCESS;  Surgeon: Glenna Fellows, MD;  Location: [facility];  Service: General;  Laterality: Right;  . LIPOSUCTION  2003   abdominal  . LUMBAR LAMINECTOMY/DECOMPRESSION MICRODISCECTOMY N/A 12/10/2015   Procedure: Right L3-4 Hemilaminectomy, Excision of herniated nucleus pulposus;  Surgeon: Eldred Manges, MD;  Location: [facility];  Service: Orthopedics;  Laterality: N/A;  . MASS EXCISION  11/03/2011   Procedure: MINOR EXCISION OF MASS;  Surgeon: Wyn Forster., MD;  Location: [facility];  Service: Orthopedics;  Laterality: Left;  debride IP joint, cyst excision left index  . SHOULDER SURGERY  2012   right  . TONSILLECTOMY  1987  . TOTAL SHOULDER ARTHROPLASTY  08/01/2011   Procedure: TOTAL SHOULDER ARTHROPLASTY;  Surgeon: Eulas Post, MD;  Location: [facility];  Service: Orthopedics;  Laterality: Right;  Right total shoulder arthroplasty  . TUBAL LIGATION  1988   Social  History   Occupational History  . Not on file.   Social History Main Topics  . Smoking status: Former Smoker    Packs/day: 0.50    Years: 10.00    Types: Cigarettes    Quit date: 12/06/2005  . Smokeless tobacco: Never Used     Comment: smoked for 10 years  . Alcohol use No     Comment: denied alcohol  . Drug use: No     Comment: denied any drug use with admission nurse  . Sexual activity: Yes    Birth control/ protection: Post-menopausal

## 2016-05-02 NOTE — Addendum Note (Signed)
Addended by: Rogers Seeds on: 05/02/2016 11:38 AM   Modules accepted: Orders

## 2016-05-05 ENCOUNTER — Ambulatory Visit
Admission: RE | Admit: 2016-05-05 | Discharge: 2016-05-05 | Disposition: A | Discharge status: Home / Self Care | Payer: Managed Care, Other (non HMO) | Source: Ambulatory Visit | Attending: Orthopaedic Surgery | Admitting: Orthopaedic Surgery

## 2016-05-05 DIAGNOSIS — M960 Pseudarthrosis after fusion or arthrodesis: Secondary | ICD-10-CM

## 2016-05-31 ENCOUNTER — Encounter: Payer: Self-pay | Admitting: *Deleted

## 2016-06-13 ENCOUNTER — Ambulatory Visit (INDEPENDENT_AMBULATORY_CARE_PROVIDER_SITE_OTHER): Payer: Managed Care, Other (non HMO) | Admitting: Orthopaedic Surgery

## 2016-06-13 ENCOUNTER — Encounter (INDEPENDENT_AMBULATORY_CARE_PROVIDER_SITE_OTHER): Payer: Self-pay | Admitting: Orthopaedic Surgery

## 2016-06-13 VITALS — BP 134/88 | HR 75 | Ht 64.0 in | Wt 155.0 lb

## 2016-06-13 DIAGNOSIS — Z981 Arthrodesis status: Secondary | ICD-10-CM

## 2016-06-13 DIAGNOSIS — F1121 Opioid dependence, in remission: Secondary | ICD-10-CM | POA: Diagnosis not present

## 2016-06-13 NOTE — Progress Notes (Signed)
Office Visit Note   Patient: Kara [name]           Date of Birth: 08-19-1955           MRN: [id number] Visit Date: 06/13/2016              Requested by: Natalia Leatherwood, DO [address] [place], [place] [address] PCP: Felix Pacini, DO   Assessment & Plan: Visit Diagnoses:  1. S/P cervical spinal fusion   2.     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 She's had some graft resorption of the C5-6 allograft and CT scan shows evidence of pseudoarthrosis at C6-7. I reviewed the x-rays with her and I'm concerned that both levels have not healed. CT scan shows a C6-7 level has obvious pseudoarthrosis. With the C5 screws looking loose that suggests that also at C5-6 it is not solid or it may of recently consolidated. Plan would be exposure at C5-C7 and checked for motion with posterior fusion at the C6-7 level with posterior interspinous wiring and Vitoss tricalcium phosphate 10 mL strip with iliac crest aspirate. 24-gauge triple strand wire would be used for interspinous process wiring. If C5-6 level shows motion that it would be included in fused also. I went over the x-rays and CT scan with the patient and gave her copies of the CT scan report. Indications for surgery discussed postoperative pain discussed. She had previous lumbar surgery done elsewhere and had pseudoarthrosis of the lumbar spine until her 2016 surgery and currently her lumbar spine is doing well after the procedure. She later had right L3-4 H&P and had this surgically treated in September 2017 and is doing well from this. Procedure discussed risks of infection discussed questions were elicited and answered. She understands and requests we proceed.  Follow-Up Instructions: No Follow-up on file.   Orders:  No orders of the defined types were placed in this encounter.  No orders of the defined types were placed in this  encounter.     Procedures: No procedures performed   Clinical Data: No additional findings.   Subjective: Chief Complaint  Patient presents with  . Spine - Follow-up    Patient returns to review CT Cervical Spine. She states that she is unable to move her neck x the past week. She has numbness in both arms, right greater than left. This is waking her up at night. It is a constant thing. She is taking her anti inflammatory medicine for pain.     Review of Systems 14 point review of systems updated past history of self reported diagnoses rheumatoid arthritis. Fibromyalgia. Breast cancer in the past. Depression without psychotic features. Agitation, PTSD, previous lumbar pseudoarthrosis with instrumented fusion. C5-6, C6-7 anterior cervical on 03/15/2015 with pseudoarthrosis by CT scan. Negative cardiac history.   Objective: Vital Signs: BP 134/88   Pulse 75   Ht 5\' 4"  (1.626 m)   Wt 155 lb (70.3 kg)   BMI 26.61 kg/m   Physical Exam  Constitutional: She is oriented to person, place, and time. She appears well-developed.  HENT:  Head: Normocephalic.  Right Ear: External ear normal.  Left Ear: External ear normal.  Eyes: Pupils are equal, round, and reactive to light.  Vitals are normal in size.  Neck: No tracheal deviation present. No thyromegaly present.  Cardiovascular: Normal rate.   Pulmonary/Chest: Effort normal.  Abdominal: Soft.  Musculoskeletal:  Well-healed lumbar incision from previous microdiscectomy. Patient has pain with full flexion 50%. Upper extremity reflexes are 2+ and symmetrical. No biceps triceps wrist flexion extension weakness. Grip finger extension EPL is normal. No shoulder impingement. Pain with extension 50% of normal range. Increased pain with rotation and tilting.  Neurological: She is alert and oriented to person, place, and time.  Skin: Skin is warm and dry.  Psychiatric: She has a normal mood and affect. Her behavior is normal.  Intact  lower extremity reflexes no isolated  weakness present with strong quads ankle dorsiflexion plantar flexion and intact lower extremity sensation no lower extremity hyperreflexia.  Ortho Exam  Specialty Comments:  No specialty comments available.  Imaging: CONTRAST  Study Result   CLINICAL DATA:  Right arm numbness and tingling for 4 weeks. Pseudoarthrosis.  EXAM: CT CERVICAL SPINE WITHOUT CONTRAST  TECHNIQUE: Multidetector CT imaging of the cervical spine was performed without intravenous contrast. Multiplanar CT image reconstructions were also generated.  COMPARISON:  01/26/2015 MRI  FINDINGS: Alignment: Straightening of the cervical spine. Mild C3-4 anterolisthesis, facet mediated.  Skull base and vertebrae: Negative for fracture or aggressive bone lesion. No signs of discitis.  Soft tissues and spinal canal: No inflammation or mass noted.  Disc levels:  C2-3: Facet arthropathy with prominent spurring and joint narrowing on the left where there is also subchondral cysts. No impingement  C3-4: Facet arthropathy with asymmetric spurring on the right. Mild right foraminal stenosis. No evidence of canal impingement  C4-5: Advanced disc degeneration with spurring and endplate irregularity, chronic based on November 2016 MRI. Patent foramina. Facet spurring asymmetric to the right.  C5-6: Discectomy with solid bony fusion that is most convincing on coronal reformats, see 6: 32. There is minimal halo around the C5 screws, but they overall appear well seated.  C6-7: Discectomy with ventral plate. There is no convincing bony fusion and there was interspinous widening on flexion radiography 3 days prior. No suspect screw loosening. Bilateral uncovertebral ridging and foraminal impingement.  C7-T1:Spondylosis.  No evidence of impingement.  Upper chest: Negative  IMPRESSION: 1. C6-7 ACDF with doubtful bony fusion. This correlates with motion on  flexion-extension radiography a few days prior. No hardware compromise. Bilateral uncovertebral ridging with foraminal stenosis. 2. C5-6 ACDF with solid bony fusion. 3. Degenerative changes are described above. No evidence of progression compared to November 2016 MRI.   Electronically Signed   By: Marnee Spring M.D.   On: 05/05/2016 16:20        PMFS History: Patient Active Problem List   Diagnosis Date Noted  . Mitral valve prolapse 01/04/2016  . Skin infection 12/22/2015  . History of recent hospitalization 12/22/2015  . Altered mental status 12/13/2015  . HNP (herniated nucleus pulposus), lumbar 12/10/2015  . Prediabetes 08/19/2015  . BMI 26.0-26.9,adult 08/18/2015  . Chronic pain 08/18/2015  . Vitamin D deficiency 08/18/2015  . Atypical chest pain 08/18/2015  . Elevated BP 08/18/2015  . Chronic fatigue disorder 06/14/2015  . Nail fungus 06/14/2015  . S/P cervical spinal fusion 03/15/2015  . Spinal surgery in prior 3 months 07/03/2014  . PTSD (post-traumatic stress disorder) 03/07/2014  . Severe recurrent major depression without psychotic features (HCC) 03/06/2014  . Suicide threat or attempt 03/06/2014  . Agitation 03/06/2014  . Aggressive behavior   . Family history of malignant neoplasm of breast   . Rheumatoid arthritis (HCC) 12/13/2011  . Fibromyalgia 12/13/2011   Past Medical History:  Diagnosis Date  . ADD (attention deficit disorder)  on Adderal  . Aggressive behavior of adult   . Allergy   . Anxiety   . Arthritis   . Asthma    as a child  . Cellulitis of breast 11/2013   RIGHT BREAST  . 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 Chronic kidney disease    due to infection from stone- no regular visits- fine now  . Chronic pain    goes to [facility] for pain control  . Cold sore   . 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 Depression    takes meds daily  . Family history  of malignant neoplasm of breast   . Fibromyalgia   . Nephrolithiasis   . Osteoporosis   . Other specified rheumatoid arthritis, right shoulder (HCC) 08/01/2011  . Post-nasal drip    hx of  . Rheumatoid arthritis(714.0)    autoimmune arthritis; methotrexate once/week  . Urinary incontinence     Family History  Problem Relation Age of Onset  . Arthritis Mother   . Heart disease Mother     ?psvt  . Breast cancer Mother 70    TAH/BSO  . Cancer Mother   . Mental illness Mother   . COPD Father   . Hypertension Father   . Alcohol abuse Father   . Mental illness Father   . Heart disease Father   . Healthy Daughter   . Breast cancer Maternal Aunt 27    deceased  . Cancer Cousin 2    female; unknown primary  . Colon cancer Paternal Aunt 57    deceased at 10  . Stomach cancer Paternal Uncle 73    deceased at 27  . Alcohol abuse Brother   . Cancer Brother   . Hodgkin's lymphoma Brother   . Mental illness Brother   . HIV Brother   . Healthy Brother   . Healthy Son     Past Surgical History:  Procedure Laterality Date  . ANTERIOR CERVICAL DECOMP/DISCECTOMY FUSION N/A 03/15/2015   Procedure: Cervical five-six, Cerival six-seven, Anterior Cervical Discectomy and Fusion, Allograft and Plate;  Surgeon: Eldred Manges, MD;  Location: [facility] OR;  Service: Orthopedics;  Laterality: N/A;  . BACK SURGERY  2011,16   lower back fusion l4-5, s1  . BLADDER SURGERY  2009  . BREAST ENHANCEMENT SURGERY  2003  . breast implants removed    . BREAST SURGERY  10/2013   REMOVAL OF BREAST IMPLANTS  . INCISION AND DRAINAGE ABSCESS Right 01/16/2014   Procedure: INCISION AND DRAINAGE AND OF RIGHT BREAST ABCESS;  Surgeon: Glenna Fellows, MD;  Location: [facility];  Service: General;  Laterality: Right;  . LIPOSUCTION  2003   abdominal  . LUMBAR LAMINECTOMY/DECOMPRESSION MICRODISCECTOMY N/A 12/10/2015   Procedure: Right L3-4 Hemilaminectomy, Excision of herniated nucleus pulposus;  Surgeon: Eldred Manges, MD;   Location: [facility] OR;  Service: Orthopedics;  Laterality: N/A;  . MASS EXCISION  11/03/2011   Procedure: MINOR EXCISION OF MASS;  Surgeon: Wyn Forster., MD;  Location: [facility];  Service: Orthopedics;  Laterality: Left;  debride IP joint, cyst excision left index  . SHOULDER SURGERY  2012   right  . TONSILLECTOMY  1987  . TOTAL SHOULDER ARTHROPLASTY  08/01/2011   Procedure: TOTAL SHOULDER ARTHROPLASTY;  Surgeon: Eulas Post, MD;  Location: [facility] OR;  Service: Orthopedics;  Laterality: Right;  Right total shoulder arthroplasty  . TUBAL LIGATION  1988   Social History   Occupational History  . Not on file.   Social History Main Topics  . Smoking status: Former Smoker    Packs/day: 0.50    Years: 10.00    Types: Cigarettes    Quit date: 12/06/2005  . Smokeless tobacco: Never Used     Comment: smoked for 10 years  . Alcohol use No     Comment: denied alcohol  . Drug use: No     Comment: denied any drug use with admission nurse  . Sexual activity: Yes    Birth control/ protection: Post-menopausal

## 2016-06-14 ENCOUNTER — Other Ambulatory Visit (INDEPENDENT_AMBULATORY_CARE_PROVIDER_SITE_OTHER): Payer: Self-pay | Admitting: Orthopaedic Surgery

## 2016-06-14 DIAGNOSIS — M4322 Fusion of spine, cervical region: Secondary | ICD-10-CM

## 2016-06-14 DIAGNOSIS — F1120 Opioid dependence, uncomplicated: Secondary | ICD-10-CM | POA: Insufficient documentation

## 2016-06-19 ENCOUNTER — Encounter (HOSPITAL_COMMUNITY): Payer: Self-pay

## 2016-06-19 ENCOUNTER — Encounter (HOSPITAL_COMMUNITY)
Admission: RE | Admit: 2016-06-19 | Discharge: 2016-06-19 | Disposition: A | Discharge status: Home / Self Care | Payer: Managed Care, Other (non HMO) | Source: Ambulatory Visit | Attending: Orthopaedic Surgery | Admitting: Orthopaedic Surgery

## 2016-06-19 DIAGNOSIS — Z01812 Encounter for preprocedural laboratory examination: Secondary | ICD-10-CM | POA: Diagnosis present

## 2016-06-19 LAB — COMPREHENSIVE METABOLIC PANEL
ALT: 23 U/L (ref 14–54)
ANION GAP: 11 (ref 5–15)
AST: 23 U/L (ref 15–41)
Albumin: 4.2 g/dL (ref 3.5–5.0)
Alkaline Phosphatase: 97 U/L (ref 38–126)
BUN: 12 mg/dL (ref 6–20)
CHLORIDE: 103 mmol/L (ref 101–111)
CO2: 24 mmol/L (ref 22–32)
Calcium: 9.5 mg/dL (ref 8.9–10.3)
Creatinine, Ser: 0.77 mg/dL (ref 0.44–1.00)
Glucose, Bld: 135 mg/dL — ABNORMAL HIGH (ref 65–99)
Potassium: 3.8 mmol/L (ref 3.5–5.1)
SODIUM: 138 mmol/L (ref 135–145)
Total Bilirubin: 0.6 mg/dL (ref 0.3–1.2)
Total Protein: 7.2 g/dL (ref 6.5–8.1)

## 2016-06-19 LAB — CBC
HEMATOCRIT: 40.7 % (ref 36.0–46.0)
HEMOGLOBIN: 13.2 g/dL (ref 12.0–15.0)
MCH: 28.3 pg (ref 26.0–34.0)
MCHC: 32.4 g/dL (ref 30.0–36.0)
MCV: 87.3 fL (ref 78.0–100.0)
PLATELETS: 336 10*3/uL (ref 150–400)
RBC: 4.66 MIL/uL (ref 3.87–5.11)
RDW: 13.2 % (ref 11.5–15.5)
WBC: 7.7 10*3/uL (ref 4.0–10.5)

## 2016-06-19 LAB — SURGICAL PCR SCREEN
MRSA, PCR: NEGATIVE
STAPHYLOCOCCUS AUREUS: NEGATIVE

## 2016-06-19 NOTE — Pre-Procedure Instructions (Signed)
    Kara [name]  06/19/2016      [id number] - [place], [place] - [address] AT COR[address] OF [facility] [address] [place] [address] Phone: [phone] Fax: [phone]  [facility] - [place], [place] - [address] [place] [address] Phone: [phone] Fax: [phone]    Your procedure is scheduled on 06/21/16.  Report to [facility] Admitting at 115 pm  Call this number if you have problems the morning of surgery:  [phone]   Remember:  Do not eat food or drink liquids after midnight.  Take these medicines the morning of surgery with A SIP OF WATER ---adderall,prozac,oxycodone,effexor   Do not wear jewelry, make-up or nail polish.  Do not wear lotions, powders, or perfumes, or deoderant.  Do not shave 48 hours prior to surgery.  Men may shave face and neck.  Do not bring valuables to the hospital.  [facility] is not responsible for any belongings or valuables.  Contacts, dentures or bridgework may not be worn into surgery.  Leave your suitcase in the car.  After surgery it may be brought to your room.  For patients admitted to the hospital, discharge time will be determined by your treatment team.  Patients discharged the day of surgery will not be allowed to drive home.   Name and phone number of your driver:   Special instructions:  Do not take any aspirin,anti-inflammatories,vitamins,or herbal supplements 5-7 days prior to surgery.  Please read over the following fact sheets that you were given. MRSA Information

## 2016-06-20 NOTE — Progress Notes (Signed)
Anesthesia chart review: Patient is a 61 year old female scheduled for posterior cervical fusion C5-7 spinous process wiring on 06/21/2016 by Dr. Ophelia Charter.  History includes former smoker (quit '07), fibromyalgia, rheumatoid arthritis, depression, anxiety, ADD, childhood asthma, chronic fatigue, right L3-4 hemilaminectomy 12/10/15, C5-7 ACDF 03/15/15, right total shoulder 08/01/11, L4-S1 fusion 07/03/14.   PCP is Dr. Felix Pacini.  Cardiologist is Dr. Nanetta Batty in 11/2015 for evaluation of atypical chest pain. Echo was essentially normal. Stress test showed normal perfusion, but could not exclude balanced ischemic--however, Dr. Allyson Sabal thought with normal systolic function that this was less likely. He "reassured her that the likelihood that her chest pain is ischemically mediated this small." She subsequently underwent lumbar surgery on 12/10/15.  Meds include Actemra, Adderall XR, Xanax, Prozac, Valtrex when necessary, Effexor XR, Ambien.  BP (!) 151/80   Pulse 90   Temp 36.9 C   Resp 20   Ht 5\' 4"  (1.626 m)   Wt 159 lb 4.8 oz (72.3 kg)   SpO2 95%   BMI 27.34 kg/m   EKG 01/04/16 ([facility]): NSR, possible left atrial enlargement, cannot rule out anterior infarct, age undetermined.  Echo 12/03/15: Study Conclusions - Left ventricle: The cavity size was normal. Wall thickness was   normal. Systolic function was normal. The estimated ejection   fraction was in the range of 60% to 65%. Wall motion was normal;   there were no regional wall motion abnormalities. Doppler   parameters are consistent with abnormal left ventricular   relaxation (grade 1 diastolic dysfunction).  Nuclear stress test 12/02/15:  The left ventricular ejection fraction is hyperdynamic (>65%).  Nuclear stress EF: 68%.  There was no ST segment deviation noted during stress.  The study is normal.  This is a low risk study.  Elevated TID ratio of 1.31. Therefore cannot exclude balanced ischemia. However, this is  less likely given the normal systolic function.  Chest CTA 12/13/15:  IMPRESSION: 1. No evidence of pulmonary embolism. 2. Low volume chest with subsegmental atelectasis.  CXR 12/13/15: IMPRESSION: Mild atelectasis in both lower lobes. No dense consolidation or lobar collapse.  Preoperative labs noted. A1c 5.8% 12/14/15.  If no acute changes then I anticipate that she can proceed as planned.  12/16/15 [facility]/Anesthesiology Phone [phone] 06/20/2016 10:25 AM

## 2016-06-21 ENCOUNTER — Observation Stay (HOSPITAL_COMMUNITY)
Admission: RE | Admit: 2016-06-21 | Discharge: 2016-06-23 | Disposition: A | Discharge status: Home / Self Care | Payer: Managed Care, Other (non HMO) | Source: Ambulatory Visit | Attending: Orthopaedic Surgery | Admitting: Orthopaedic Surgery

## 2016-06-21 ENCOUNTER — Encounter (HOSPITAL_COMMUNITY): Payer: Self-pay | Admitting: *Deleted

## 2016-06-21 ENCOUNTER — Ambulatory Visit (HOSPITAL_COMMUNITY): Payer: Managed Care, Other (non HMO) | Admitting: Vascular Surgery

## 2016-06-21 ENCOUNTER — Ambulatory Visit (HOSPITAL_COMMUNITY): Payer: Managed Care, Other (non HMO)

## 2016-06-21 ENCOUNTER — Ambulatory Visit (HOSPITAL_COMMUNITY): Payer: Managed Care, Other (non HMO) | Admitting: Anesthesiology

## 2016-06-21 ENCOUNTER — Encounter (HOSPITAL_COMMUNITY)
Admission: RE | Disposition: A | Discharge status: Home / Self Care | Payer: Self-pay | Source: Ambulatory Visit | Attending: Orthopaedic Surgery

## 2016-06-21 DIAGNOSIS — M797 Fibromyalgia: Secondary | ICD-10-CM | POA: Diagnosis not present

## 2016-06-21 DIAGNOSIS — Z87891 Personal history of nicotine dependence: Secondary | ICD-10-CM | POA: Insufficient documentation

## 2016-06-21 DIAGNOSIS — Z888 Allergy status to other drugs, medicaments and biological substances status: Secondary | ICD-10-CM | POA: Insufficient documentation

## 2016-06-21 DIAGNOSIS — S129XXD Fracture of neck, unspecified, subsequent encounter: Secondary | ICD-10-CM | POA: Diagnosis not present

## 2016-06-21 DIAGNOSIS — M1990 Unspecified osteoarthritis, unspecified site: Secondary | ICD-10-CM | POA: Insufficient documentation

## 2016-06-21 DIAGNOSIS — R5382 Chronic fatigue, unspecified: Secondary | ICD-10-CM | POA: Diagnosis not present

## 2016-06-21 DIAGNOSIS — Z818 Family history of other mental and behavioral disorders: Secondary | ICD-10-CM | POA: Insufficient documentation

## 2016-06-21 DIAGNOSIS — Z800 Family history of malignant neoplasm of digestive organs: Secondary | ICD-10-CM | POA: Insufficient documentation

## 2016-06-21 DIAGNOSIS — M810 Age-related osteoporosis without current pathological fracture: Secondary | ICD-10-CM | POA: Diagnosis not present

## 2016-06-21 DIAGNOSIS — Z811 Family history of alcohol abuse and dependence: Secondary | ICD-10-CM | POA: Diagnosis not present

## 2016-06-21 DIAGNOSIS — F329 Major depressive disorder, single episode, unspecified: Secondary | ICD-10-CM | POA: Insufficient documentation

## 2016-06-21 DIAGNOSIS — Z886 Allergy status to analgesic agent status: Secondary | ICD-10-CM | POA: Diagnosis not present

## 2016-06-21 DIAGNOSIS — Z803 Family history of malignant neoplasm of breast: Secondary | ICD-10-CM | POA: Insufficient documentation

## 2016-06-21 DIAGNOSIS — M960 Pseudarthrosis after fusion or arthrodesis: Principal | ICD-10-CM | POA: Insufficient documentation

## 2016-06-21 DIAGNOSIS — Z79899 Other long term (current) drug therapy: Secondary | ICD-10-CM | POA: Insufficient documentation

## 2016-06-21 DIAGNOSIS — M4322 Fusion of spine, cervical region: Secondary | ICD-10-CM

## 2016-06-21 DIAGNOSIS — Z419 Encounter for procedure for purposes other than remedying health state, unspecified: Secondary | ICD-10-CM

## 2016-06-21 DIAGNOSIS — Z8249 Family history of ischemic heart disease and other diseases of the circulatory system: Secondary | ICD-10-CM | POA: Diagnosis not present

## 2016-06-21 DIAGNOSIS — S129XXA Fracture of neck, unspecified, initial encounter: Secondary | ICD-10-CM | POA: Diagnosis present

## 2016-06-21 DIAGNOSIS — Z96611 Presence of right artificial shoulder joint: Secondary | ICD-10-CM | POA: Diagnosis not present

## 2016-06-21 DIAGNOSIS — Z9889 Other specified postprocedural states: Secondary | ICD-10-CM

## 2016-06-21 DIAGNOSIS — M069 Rheumatoid arthritis, unspecified: Secondary | ICD-10-CM | POA: Diagnosis not present

## 2016-06-21 DIAGNOSIS — F419 Anxiety disorder, unspecified: Secondary | ICD-10-CM | POA: Diagnosis not present

## 2016-06-21 HISTORY — PX: POSTERIOR CERVICAL FUSION/FORAMINOTOMY: SHX5038

## 2016-06-21 SURGERY — POSTERIOR CERVICAL FUSION/FORAMINOTOMY LEVEL 2
Anesthesia: General | Site: Neck

## 2016-06-21 MED ORDER — PROPOFOL 10 MG/ML IV BOLUS
INTRAVENOUS | Status: AC
  Filled 2016-06-21: qty 20

## 2016-06-21 MED ORDER — SODIUM CHLORIDE 0.9% FLUSH
3.0000 mL | Freq: Two times a day (BID) | INTRAVENOUS | Status: DC
  Administered 2016-06-22 – 2016-06-23 (×4): 3 mL via INTRAVENOUS

## 2016-06-21 MED ORDER — PHENOL 1.4 % MT LIQD: 1.0000 | OROMUCOSAL | Status: DC | PRN

## 2016-06-21 MED ORDER — SUGAMMADEX SODIUM 200 MG/2ML IV SOLN
INTRAVENOUS | Status: AC
  Filled 2016-06-21: qty 2

## 2016-06-21 MED ORDER — CEFAZOLIN SODIUM-DEXTROSE 2-4 GM/100ML-% IV SOLN
2.0000 g | INTRAVENOUS | Status: DC
  Filled 2016-06-21: qty 100

## 2016-06-21 MED ORDER — METHOCARBAMOL 500 MG PO TABS
ORAL_TABLET | ORAL | Status: AC
  Administered 2016-06-21: 500 mg via ORAL
  Filled 2016-06-21: qty 1

## 2016-06-21 MED ORDER — FENTANYL CITRATE (PF) 100 MCG/2ML IJ SOLN
INTRAMUSCULAR | Status: DC | PRN
  Administered 2016-06-21: 50 ug via INTRAVENOUS
  Administered 2016-06-21 (×2): 100 ug via INTRAVENOUS
  Administered 2016-06-21 (×2): 50 ug via INTRAVENOUS

## 2016-06-21 MED ORDER — CHLORHEXIDINE GLUCONATE 4 % EX LIQD: 60.0000 mL | Freq: Once | CUTANEOUS | Status: DC

## 2016-06-21 MED ORDER — BUPIVACAINE-EPINEPHRINE 0.5% -1:200000 IJ SOLN
INTRAMUSCULAR | Status: DC | PRN
  Administered 2016-06-21: 17 mL

## 2016-06-21 MED ORDER — SUGAMMADEX SODIUM 200 MG/2ML IV SOLN
INTRAVENOUS | Status: DC | PRN
  Administered 2016-06-21: 150 mg via INTRAVENOUS

## 2016-06-21 MED ORDER — ONDANSETRON HCL 4 MG/2ML IJ SOLN
INTRAMUSCULAR | Status: AC
  Filled 2016-06-21: qty 2

## 2016-06-21 MED ORDER — ROCURONIUM BROMIDE 10 MG/ML (PF) SYRINGE
PREFILLED_SYRINGE | INTRAVENOUS | Status: DC | PRN
  Administered 2016-06-21: 50 mg via INTRAVENOUS

## 2016-06-21 MED ORDER — FENTANYL CITRATE (PF) 250 MCG/5ML IJ SOLN
INTRAMUSCULAR | Status: AC
  Filled 2016-06-21: qty 5

## 2016-06-21 MED ORDER — MENTHOL 3 MG MT LOZG: 1.0000 | LOZENGE | OROMUCOSAL | Status: DC | PRN

## 2016-06-21 MED ORDER — LIDOCAINE 2% (20 MG/ML) 5 ML SYRINGE
INTRAMUSCULAR | Status: DC | PRN
  Administered 2016-06-21: 100 mg via INTRAVENOUS

## 2016-06-21 MED ORDER — CEFAZOLIN IN D5W 1 GM/50ML IV SOLN
1.0000 g | Freq: Three times a day (TID) | INTRAVENOUS | Status: AC
  Administered 2016-06-21 – 2016-06-22 (×2): 1 g via INTRAVENOUS
  Filled 2016-06-21 (×2): qty 50

## 2016-06-21 MED ORDER — ONDANSETRON HCL 4 MG/2ML IJ SOLN
INTRAMUSCULAR | Status: DC | PRN
  Administered 2016-06-21 (×2): 4 mg via INTRAVENOUS

## 2016-06-21 MED ORDER — AMPHETAMINE-DEXTROAMPHETAMINE 10 MG PO TABS
30.0000 mg | ORAL_TABLET | Freq: Every day | ORAL | Status: DC
Start: 2016-06-22 — End: 2016-06-23
  Administered 2016-06-22 – 2016-06-23 (×2): 30 mg via ORAL
  Filled 2016-06-21 (×2): qty 3

## 2016-06-21 MED ORDER — OXYCODONE HCL 5 MG PO TABS
5.0000 mg | ORAL_TABLET | ORAL | Status: DC | PRN
Start: 2016-06-21 — End: 2016-06-23
  Administered 2016-06-21 – 2016-06-23 (×8): 10 mg via ORAL
  Filled 2016-06-21 (×7): qty 2

## 2016-06-21 MED ORDER — PROPOFOL 10 MG/ML IV BOLUS
INTRAVENOUS | Status: DC | PRN
  Administered 2016-06-21: 140 mg via INTRAVENOUS

## 2016-06-21 MED ORDER — FENTANYL CITRATE (PF) 100 MCG/2ML IJ SOLN
INTRAMUSCULAR | Status: AC
  Administered 2016-06-21: 50 ug via INTRAVENOUS
  Filled 2016-06-21: qty 2

## 2016-06-21 MED ORDER — ACETAMINOPHEN 325 MG PO TABS: 650.0000 mg | ORAL_TABLET | ORAL | Status: DC | PRN

## 2016-06-21 MED ORDER — ONDANSETRON HCL 4 MG/2ML IJ SOLN: 4.0000 mg | Freq: Four times a day (QID) | INTRAMUSCULAR | Status: DC | PRN

## 2016-06-21 MED ORDER — ACETAMINOPHEN 650 MG RE SUPP: 650.0000 mg | RECTAL | Status: DC | PRN

## 2016-06-21 MED ORDER — PHENYLEPHRINE HCL 10 MG/ML IJ SOLN
INTRAMUSCULAR | Status: AC
  Filled 2016-06-21: qty 1

## 2016-06-21 MED ORDER — ZOLPIDEM TARTRATE 5 MG PO TABS
5.0000 mg | ORAL_TABLET | Freq: Every evening | ORAL | Status: DC | PRN
  Administered 2016-06-21 – 2016-06-22 (×2): 5 mg via ORAL
  Filled 2016-06-21 (×2): qty 1

## 2016-06-21 MED ORDER — ROCURONIUM BROMIDE 50 MG/5ML IV SOSY
PREFILLED_SYRINGE | INTRAVENOUS | Status: AC
  Filled 2016-06-21: qty 5

## 2016-06-21 MED ORDER — METHOCARBAMOL 1000 MG/10ML IJ SOLN: 500.0000 mg | Freq: Four times a day (QID) | INTRAVENOUS | Status: DC | PRN

## 2016-06-21 MED ORDER — 0.9 % SODIUM CHLORIDE (POUR BTL) OPTIME
TOPICAL | Status: DC | PRN
  Administered 2016-06-21: 1000 mL

## 2016-06-21 MED ORDER — EPINEPHRINE PF 1 MG/ML IJ SOLN
INTRAMUSCULAR | Status: AC
  Filled 2016-06-21: qty 1

## 2016-06-21 MED ORDER — MIDAZOLAM HCL 2 MG/2ML IJ SOLN
INTRAMUSCULAR | Status: AC
  Filled 2016-06-21: qty 2

## 2016-06-21 MED ORDER — FENTANYL CITRATE (PF) 100 MCG/2ML IJ SOLN
25.0000 ug | INTRAMUSCULAR | Status: DC | PRN
  Administered 2016-06-21 (×3): 50 ug via INTRAVENOUS

## 2016-06-21 MED ORDER — LIDOCAINE 2% (20 MG/ML) 5 ML SYRINGE
INTRAMUSCULAR | Status: AC
  Filled 2016-06-21: qty 5

## 2016-06-21 MED ORDER — SODIUM CHLORIDE 0.9% FLUSH: 3.0000 mL | INTRAVENOUS | Status: DC | PRN

## 2016-06-21 MED ORDER — SODIUM CHLORIDE 0.9 % IV SOLN: 250.0000 mL | INTRAVENOUS | Status: DC

## 2016-06-21 MED ORDER — ALPRAZOLAM 0.5 MG PO TABS
1.0000 mg | ORAL_TABLET | Freq: Three times a day (TID) | ORAL | Status: DC | PRN
  Administered 2016-06-22: 1 mg via ORAL
  Filled 2016-06-21: qty 2

## 2016-06-21 MED ORDER — LACTATED RINGERS IV SOLN
INTRAVENOUS | Status: DC
Start: 2016-06-21 — End: 2016-06-23
  Administered 2016-06-21 (×2): via INTRAVENOUS

## 2016-06-21 MED ORDER — ARTIFICIAL TEARS OP OINT
TOPICAL_OINTMENT | OPHTHALMIC | Status: AC
  Filled 2016-06-21: qty 3.5

## 2016-06-21 MED ORDER — HYDROMORPHONE HCL 1 MG/ML IJ SOLN: 1.0000 mg | INTRAMUSCULAR | Status: DC | PRN

## 2016-06-21 MED ORDER — SODIUM CHLORIDE 0.9 % IV SOLN
INTRAVENOUS | Status: DC
  Administered 2016-06-22: 02:00:00 via INTRAVENOUS

## 2016-06-21 MED ORDER — VANCOMYCIN HCL 500 MG IV SOLR
INTRAVENOUS | Status: DC | PRN
  Administered 2016-06-21: 250 mg

## 2016-06-21 MED ORDER — MIDAZOLAM HCL 2 MG/2ML IJ SOLN
INTRAMUSCULAR | Status: DC | PRN
  Administered 2016-06-21 (×2): 2 mg via INTRAVENOUS

## 2016-06-21 MED ORDER — EPHEDRINE SULFATE-NACL 50-0.9 MG/10ML-% IV SOSY
PREFILLED_SYRINGE | INTRAVENOUS | Status: DC | PRN
  Administered 2016-06-21 (×2): 10 mg via INTRAVENOUS
  Administered 2016-06-21: 15 mg via INTRAVENOUS

## 2016-06-21 MED ORDER — PHENYLEPHRINE 40 MCG/ML (10ML) SYRINGE FOR IV PUSH (FOR BLOOD PRESSURE SUPPORT)
PREFILLED_SYRINGE | INTRAVENOUS | Status: DC | PRN
  Administered 2016-06-21: 120 ug via INTRAVENOUS

## 2016-06-21 MED ORDER — FLUOXETINE HCL 10 MG PO CAPS
10.0000 mg | ORAL_CAPSULE | Freq: Every day | ORAL | Status: DC
  Administered 2016-06-22: 10 mg via ORAL
  Filled 2016-06-21 (×3): qty 1

## 2016-06-21 MED ORDER — VANCOMYCIN HCL 1000 MG IV SOLR
INTRAVENOUS | Status: AC
  Filled 2016-06-21: qty 1000

## 2016-06-21 MED ORDER — BUPIVACAINE HCL (PF) 0.25 % IJ SOLN
INTRAMUSCULAR | Status: AC
  Filled 2016-06-21: qty 30

## 2016-06-21 MED ORDER — DOCUSATE SODIUM 100 MG PO CAPS
100.0000 mg | ORAL_CAPSULE | Freq: Two times a day (BID) | ORAL | Status: DC
  Administered 2016-06-22 – 2016-06-23 (×3): 100 mg via ORAL
  Filled 2016-06-21 (×4): qty 1

## 2016-06-21 MED ORDER — METHOCARBAMOL 500 MG PO TABS
500.0000 mg | ORAL_TABLET | Freq: Four times a day (QID) | ORAL | Status: DC | PRN
  Administered 2016-06-21 – 2016-06-23 (×4): 500 mg via ORAL
  Filled 2016-06-21 (×3): qty 1

## 2016-06-21 MED ORDER — DEXAMETHASONE SODIUM PHOSPHATE 10 MG/ML IJ SOLN
INTRAMUSCULAR | Status: DC | PRN
  Administered 2016-06-21: 10 mg via INTRAVENOUS

## 2016-06-21 MED ORDER — PHENYLEPHRINE HCL 10 MG/ML IJ SOLN
INTRAVENOUS | Status: DC | PRN
  Administered 2016-06-21: 30 ug/min via INTRAVENOUS

## 2016-06-21 MED ORDER — ONDANSETRON HCL 4 MG PO TABS: 4.0000 mg | ORAL_TABLET | Freq: Four times a day (QID) | ORAL | Status: DC | PRN

## 2016-06-21 MED ORDER — CEFAZOLIN SODIUM-DEXTROSE 2-3 GM-% IV SOLR
INTRAVENOUS | Status: DC | PRN
  Administered 2016-06-21: 2 g via INTRAVENOUS

## 2016-06-21 MED ORDER — OXYCODONE HCL 5 MG PO TABS
ORAL_TABLET | ORAL | Status: AC
  Administered 2016-06-21: 10 mg via ORAL
  Filled 2016-06-21: qty 2

## 2016-06-21 MED ORDER — VANCOMYCIN HCL 500 MG IV SOLR
INTRAVENOUS | Status: AC
  Filled 2016-06-21: qty 500

## 2016-06-21 MED ORDER — VENLAFAXINE HCL ER 150 MG PO CP24
150.0000 mg | ORAL_CAPSULE | Freq: Every day | ORAL | Status: DC
  Administered 2016-06-22 – 2016-06-23 (×2): 150 mg via ORAL
  Filled 2016-06-21 (×2): qty 1

## 2016-06-21 MED ORDER — POLYETHYLENE GLYCOL 3350 17 G PO PACK: 17.0000 g | PACK | Freq: Every day | ORAL | Status: DC | PRN

## 2016-06-21 MED ORDER — HYDROMORPHONE HCL 2 MG/ML IJ SOLN
1.0000 mg | INTRAMUSCULAR | Status: DC | PRN
Start: 2016-06-21 — End: 2016-06-22
  Administered 2016-06-21 – 2016-06-22 (×3): 1 mg via INTRAVENOUS
  Filled 2016-06-21 (×3): qty 1

## 2016-06-21 SURGICAL SUPPLY — 62 items
24 gauge wire ×2 IMPLANT
BENZOIN TINCTURE PRP APPL 2/3 (GAUZE/BANDAGES/DRESSINGS) ×2 IMPLANT
BLADE CLIPPER SURG (BLADE) IMPLANT
BLADE SURG 15 STRL LF DISP TIS (BLADE) ×1 IMPLANT
BLADE SURG 15 STRL SS (BLADE) ×1
BUR ROUND FLUTED 4 SOFT TCH (BURR) IMPLANT
CORDS BIPOLAR (ELECTRODE) ×2 IMPLANT
COVER SURGICAL LIGHT HANDLE (MISCELLANEOUS) ×2 IMPLANT
DERMABOND ADVANCED (GAUZE/BANDAGES/DRESSINGS) ×1
DERMABOND ADVANCED .7 DNX12 (GAUZE/BANDAGES/DRESSINGS) ×1 IMPLANT
DRAPE PROXIMA HALF (DRAPES) ×2 IMPLANT
DRAPE SURG 17X23 STRL (DRAPES) ×10 IMPLANT
DRESSING AQUACEL AG SP 3.5X4 (GAUZE/BANDAGES/DRESSINGS) ×1 IMPLANT
DRSG AQUACEL AG SP 3.5X4 (GAUZE/BANDAGES/DRESSINGS) ×2
DRSG MEPILEX BORDER 4X4 (GAUZE/BANDAGES/DRESSINGS) ×2 IMPLANT
DRSG MEPILEX BORDER 4X8 (GAUZE/BANDAGES/DRESSINGS) ×2 IMPLANT
DURAPREP 26ML APPLICATOR (WOUND CARE) ×2 IMPLANT
ELECT REM PT RETURN 9FT ADLT (ELECTROSURGICAL) ×2
ELECTRODE REM PT RTRN 9FT ADLT (ELECTROSURGICAL) ×1 IMPLANT
EVACUATOR 1/8 PVC DRAIN (DRAIN) IMPLANT
GAUZE XEROFORM 5X9 LF (GAUZE/BANDAGES/DRESSINGS) ×2 IMPLANT
GLOVE BIO SURGEON STRL SZ7 (GLOVE) ×4 IMPLANT
GLOVE BIOGEL M 7.0 STRL (GLOVE) ×2 IMPLANT
GLOVE BIOGEL PI IND STRL 7.0 (GLOVE) ×1 IMPLANT
GLOVE BIOGEL PI IND STRL 8 (GLOVE) ×2 IMPLANT
GLOVE BIOGEL PI INDICATOR 7.0 (GLOVE) ×1
GLOVE BIOGEL PI INDICATOR 8 (GLOVE) ×2
GLOVE ORTHO TXT STRL SZ7.5 (GLOVE) ×4 IMPLANT
GOWN STRL REUS W/ TWL LRG LVL3 (GOWN DISPOSABLE) ×1 IMPLANT
GOWN STRL REUS W/ TWL XL LVL3 (GOWN DISPOSABLE) ×1 IMPLANT
GOWN STRL REUS W/TWL 2XL LVL3 (GOWN DISPOSABLE) ×2 IMPLANT
GOWN STRL REUS W/TWL LRG LVL3 (GOWN DISPOSABLE) ×1
GOWN STRL REUS W/TWL XL LVL3 (GOWN DISPOSABLE) ×1
KIT BASIN OR (CUSTOM PROCEDURE TRAY) ×2 IMPLANT
KIT ROOM TURNOVER OR (KITS) ×2 IMPLANT
MANIFOLD NEPTUNE II (INSTRUMENTS) ×2 IMPLANT
NEEDLE 1/2 CIR MAYO (NEEDLE) IMPLANT
NEEDLE ASP BONE MRW 11GX15 J (NEEDLE) ×2 IMPLANT
NS IRRIG 1000ML POUR BTL (IV SOLUTION) ×2 IMPLANT
PACK ORTHO CERVICAL (CUSTOM PROCEDURE TRAY) ×2 IMPLANT
PACK VITOSS BIOACTIVE 10CC (Neuro Prosthesis/Implant) ×2 IMPLANT
PAD ARMBOARD 7.5X6 YLW CONV (MISCELLANEOUS) ×4 IMPLANT
SPONGE LAP 4X18 X RAY DECT (DISPOSABLE) ×6 IMPLANT
SPONGE SURGIFOAM ABS GEL 100 (HEMOSTASIS) IMPLANT
STAPLER VISISTAT 35W (STAPLE) IMPLANT
STRIP CLOSURE SKIN 1/2X4 (GAUZE/BANDAGES/DRESSINGS) ×2 IMPLANT
SUT BONE WAX W31G (SUTURE) ×2 IMPLANT
SUT STEEL 1 (SUTURE) IMPLANT
SUT STEEL 2 (SUTURE) ×2 IMPLANT
SUT VIC AB 0 CT1 27 (SUTURE) ×1
SUT VIC AB 0 CT1 27XBRD ANBCTR (SUTURE) ×1 IMPLANT
SUT VIC AB 2-0 CT1 27 (SUTURE) ×2
SUT VIC AB 2-0 CT1 TAPERPNT 27 (SUTURE) ×2 IMPLANT
SUT VIC AB 3-0 X1 27 (SUTURE) IMPLANT
SUT VICRYL 0 TIES 12 18 (SUTURE) ×2 IMPLANT
SUT VICRYL 4-0 PS2 18IN ABS (SUTURE) IMPLANT
SUT VICRYL AB 2 0 TIES (SUTURE) ×2 IMPLANT
TOWEL OR 17X24 6PK STRL BLUE (TOWEL DISPOSABLE) ×2 IMPLANT
TOWEL OR 17X26 10 PK STRL BLUE (TOWEL DISPOSABLE) ×2 IMPLANT
TRAY FOLEY W/METER SILVER 16FR (SET/KITS/TRAYS/PACK) IMPLANT
WATER STERILE IRR 1000ML POUR (IV SOLUTION) ×2 IMPLANT
YANKAUER SUCT BULB TIP NO VENT (SUCTIONS) ×2 IMPLANT

## 2016-06-21 NOTE — Anesthesia Procedure Notes (Signed)
Procedure Name: Intubation Date/Time: 06/21/2016 4:33 PM Performed by: Geraldo Docker Pre-anesthesia Checklist: Patient identified, Patient being monitored, Timeout performed, Emergency Drugs available and Suction available Patient Re-evaluated:Patient Re-evaluated prior to inductionOxygen Delivery Method: Circle System Utilized Preoxygenation: Pre-oxygenation with 100% oxygen Intubation Type: IV induction Ventilation: Mask ventilation without difficulty Laryngoscope Size: Miller and 3 Grade View: Grade III Tube type: Oral Tube size: 7.5 mm Number of attempts: 1 Airway Equipment and Method: Stylet Placement Confirmation: ETT inserted through vocal cords under direct vision,  positive ETCO2 and breath sounds checked- equal and bilateral Secured at: 21 cm Tube secured with: Tape Dental Injury: Teeth and Oropharynx as per pre-operative assessment

## 2016-06-21 NOTE — Anesthesia Postprocedure Evaluation (Signed)
Anesthesia Post Note  Patient: Kara [name]  Procedure(s) Performed: Procedure(s) (LRB): POSTERIOR CERVICAL FUSION C5-C7 SPINOUS PROCESS WIRING (N/A)  Patient location during evaluation: PACU Anesthesia Type: General Level of consciousness: awake and alert Pain management: pain level controlled Vital Signs Assessment: post-procedure vital signs reviewed and stable Respiratory status: spontaneous breathing, nonlabored ventilation, respiratory function stable and patient connected to nasal cannula oxygen Cardiovascular status: blood pressure returned to baseline and stable Postop Assessment: no signs of nausea or vomiting Anesthetic complications: no       Last Vitals:  Vitals:   06/21/16 2002 06/21/16 2004  BP: 102/63   Pulse:  70  Resp:  11  Temp:  36.6 C    Last Pain:  Vitals:   06/21/16 1943  TempSrc:   PainSc: 10-Worst pain ever                 Kennieth Rad

## 2016-06-21 NOTE — Transfer of Care (Signed)
Immediate Anesthesia Transfer of Care Note  Patient: Kara [name]  Procedure(s) Performed: Procedure(s): POSTERIOR CERVICAL FUSION C5-C7 SPINOUS PROCESS WIRING (N/A)  Patient Location: PACU  Anesthesia Type:General  Level of Consciousness: awake, alert  and oriented  Airway & Oxygen Therapy: Patient connected to nasal cannula oxygen  Post-op Assessment: Report given to RN, Post -op Vital signs reviewed and stable and Patient moving all extremities X 4  Post vital signs: Reviewed and stable  Last Vitals:  Vitals:   06/21/16 1321 06/21/16 1830  BP: 111/71 130/73  Pulse: 68 79  Resp: 20 13  Temp: 36.8 C 36.6 C    Last Pain:  Vitals:   06/21/16 1321  TempSrc: Oral  PainSc:          Complications: No apparent anesthesia complications

## 2016-06-21 NOTE — Op Note (Signed)
Preop diagnosis: Cervical pseudoarthrosis post C5-6 C6-7 anterior cervical discectomy and fusion  Postop diagnosis same  Procedure ;posterior cervical fusion C5-C7 (2 level posterior fusion with triple strand wire, iliac crest bone marrow aspirate and  Vitoss 10cc strip  Surgeon: Annell Greening M.D.  Anesthesia Gen. plus Marcaine skin local 17 mL.  Brief history 61 year old female had cervical fusion with some resorption graft and persistent pain CT scan showed pseudoarthrosis she had some loose screws by plain radiograph and CT scan in C5. CT scan suggested the graft was incorporated at C5-6 and there was pseudoarthrosis at C 6-7. Intraoperatively both levels had motion consistent with pseudoarthrosis at both levels.  Procedure after induction of general anesthesia patient placed in prone position horseshoe head holder. Arms tucked beside with careful padding over the ulnar nerve. Tape was applied pulled down to the thoracic region area squared with 1010 drapes over the left posterior iliac crest as well as posterior cervical region. Prepping with DuraPrep preoperative Ancef prophylaxis. Sterile Mayo stand at the head sterile towels sterile skin marker at the midline and Betadine Steri-Drape. Second Betadine Steri-Drape was applied over the right posterior iliac crest after squared with towels with Betadine Steri-Drape. Thyroid sheet was applied. Timeout procedure was completed. Midline incision was made from C4-T1 supple process of dissection from the large show C7 corresponding with the radiographs which were up in the room as well as CT scan. C5-C6 and C7 oral lamina were exposed on each side and self tanning retractor was placed. Meticulous hemostasis was performed. Coker clamp was pulled on C7 and C6 looking and there was motion between the lamina consistent with pseudoarthrosis. There is also motion at C5 but this was less obvious. There is still motion consistent with pseudoarthrosis at this level.  24-gauge wire was twisted and the cable applying saline as it was distracted twisted. It was passed underneath C7 top of C6 5 had a small notch placed in a. A Coker clamp had been placed it the interspace between the spinous process at C4 and C5 and then another one was placed between C6 and C7 and x-ray was taken counting down from the top consider confirming that the Coker clamp on the top was over the top of C5. Wire was tightened down twisted until it was snug secure wouldn't move and pulling a Coker at C5 and C7 there is no motion between any of the 3 levels L moved as one unit. Iliac aspirate was performed stab incision using the Synthes aspirate 10 mL to 11 mL of the blood was obtained moving around to maximize ostial progenitor cells. He was placed over the detox bioactive and allowed to sit while the lamina was decorticated with the 4 mm bur. The tonsils and PACs after cutting in the middle half on each side down to the freshened blood surface of bleeding bone. Repeat x-rays were taken with C-arm particularly with obliques confirming where at the appropriate level and the top of the wire was over the top of C5. Before the vitae acid been placed and lamina was burred we copiously irrigated the entire field. After bone graft was placed and bleeding bone with ostial progenitor cells are placed female irrigated the subtendinous tissue after we darty closed the fascia with #1 Vicryl suture. Some vancomycin powder was placed on glove in just the sprinkled and scattered in the subtendinous tissue and the top portion of the fascia due to the increased risks of posterior cervical procedures developing infection. She had had  history of positive PCR in the distant past but was negative at time of admission. Subtendinous tissue reapproximated with 2-0 Vicryl for Vicryl subcuticular closure. Marcaine 17 mL was infiltrated. Tincture benzoin Steri-Strips of Aquacel dressing. One Steri-Strip was placed for the stab incision  for the iliac aspirate and then a Band-Aid. Patient tolerated was transferred to spine position extubated soft cervical collar applied and then transferred recovery room.

## 2016-06-21 NOTE — H&P (Signed)
Kara [name] is an 61 y.o. female.   Chief Complaint: Neck pain and bilateral upper extremity radiculopathy HPI: Patient with history of cervical pseudoarthrosis and the above complaint presents for surgical intervention today. Ongoing symptoms. Failed conservative treatment.  Past Medical History:  Diagnosis Date  . ADD (attention deficit disorder)    on Adderal  . Aggressive behavior of adult   . Allergy   . Anxiety   . Arthritis   . Asthma    as a child  . Cellulitis of breast 11/2013   RIGHT BREAST  . Chronic fatigue and immune dysfunction syndrome ([place])   . Chronic kidney disease    due to infection from stone- no regular visits- fine now  . Chronic pain    goes to [facility] for pain control  . Cold sore   . 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 Depression    takes meds daily  . Family history of malignant neoplasm of breast   . Fibromyalgia   . Nephrolithiasis   . Osteoporosis   . Other specified rheumatoid arthritis, right shoulder ([place]) 08/01/2011  . Post-nasal drip    hx of  . Rheumatoid arthritis(714.0)    autoimmune arthritis; methotrexate once/week  . Urinary incontinence     Past Surgical History:  Procedure Laterality Date  . ANTERIOR CERVICAL DECOMP/DISCECTOMY FUSION N/A 03/15/2015   Procedure: Cervical five-six, Cerival six-seven, Anterior Cervical Discectomy and Fusion, Allograft and Plate;  Surgeon: Marybelle Killings, MD;  Location: [facility];  Service: Orthopedics;  Laterality: N/A;  . BACK SURGERY  2011,16   lower back fusion l4-5, s1  . BLADDER SURGERY  2009  . BREAST ENHANCEMENT SURGERY  2003  . breast implants removed    . BREAST SURGERY  10/2013   REMOVAL OF BREAST IMPLANTS  . INCISION AND DRAINAGE ABSCESS Right 01/16/2014   Procedure: INCISION AND DRAINAGE AND OF RIGHT BREAST ABCESS;  Surgeon: Excell Seltzer, MD;  Location: [facility];  Service: General;  Laterality: Right;  . JOINT REPLACEMENT    .  LIPOSUCTION  2003   abdominal  . LUMBAR LAMINECTOMY/DECOMPRESSION MICRODISCECTOMY N/A 12/10/2015   Procedure: Right L3-4 Hemilaminectomy, Excision of herniated nucleus pulposus;  Surgeon: Marybelle Killings, MD;  Location: [facility];  Service: Orthopedics;  Laterality: N/A;  . MASS EXCISION  11/03/2011   Procedure: MINOR EXCISION OF MASS;  Surgeon: Cammie Sickle., MD;  Location: [facility];  Service: Orthopedics;  Laterality: Left;  debride IP joint, cyst excision left index  . SHOULDER SURGERY  2012   right  . TONSILLECTOMY  1987  . TOTAL SHOULDER ARTHROPLASTY  08/01/2011   Procedure: TOTAL SHOULDER ARTHROPLASTY;  Surgeon: Johnny Bridge, MD;  Location: [facility];  Service: Orthopedics;  Laterality: Right;  Right total shoulder arthroplasty  . TUBAL LIGATION  1988    Family History  Problem Relation Age of Onset  . Arthritis Mother   . Heart disease Mother     ?psvt  . Breast cancer Mother 43    TAH/BSO  . Cancer Mother   . Mental illness Mother   . COPD Father   . Hypertension Father   . Alcohol abuse Father   . Mental illness Father   . Heart disease Father   . Healthy Daughter   . Breast cancer Maternal Aunt 27    deceased  . Cancer Cousin 63    female; unknown primary  .  Colon cancer Paternal Aunt 45    deceased at 64  . Stomach cancer Paternal Uncle 87    deceased at 62  . Alcohol abuse Brother   . Cancer Brother   . Hodgkin's lymphoma Brother   . Mental illness Brother   . HIV Brother   . Healthy Brother   . Healthy Son    Social History:  reports that she quit smoking about 10 years ago. Her smoking use included Cigarettes. She has a 5.00 pack-year smoking history. She has never used smokeless tobacco. She reports that she does not drink alcohol or use drugs.  Allergies:  Allergies  Allergen Reactions  . Aspirin Anaphylaxis  . Ibuprofen Anaphylaxis  . Ativan [Lorazepam] Other (See Comments)    Makes agitated, combative   . Ketamine Other (See Comments)     Hallucinations  . Toradol [Ketorolac Tromethamine] Itching, Swelling and Rash    UNSPECIFIED REACTION   . Tramadol Hcl Itching, Swelling and Rash    SWELLING REACTION UNSPECIFIED     No prescriptions prior to admission.    Results for orders placed or performed during the hospital encounter of 06/19/16 (from the past 48 hour(s))  Surgical pcr screen     Status: None   Collection Time: 06/19/16  1:37 PM  Result Value Ref Range   MRSA, PCR NEGATIVE NEGATIVE   Staphylococcus aureus NEGATIVE NEGATIVE    Comment:        The Xpert SA Assay (FDA approved for NASAL specimens in patients over 26 years of age), is one component of a comprehensive surveillance program.  Test performance has been validated by [facility] for patients greater than or equal to 17 year old. It is not intended to diagnose infection nor to guide or monitor treatment.   Comprehensive metabolic panel     Status: Abnormal   Collection Time: 06/19/16  1:38 PM  Result Value Ref Range   Sodium 138 135 - 145 mmol/L   Potassium 3.8 3.5 - 5.1 mmol/L   Chloride 103 101 - 111 mmol/L   CO2 24 22 - 32 mmol/L   Glucose, Bld 135 (H) 65 - 99 mg/dL   BUN 12 6 - 20 mg/dL   Creatinine, Ser 0.77 0.44 - 1.00 mg/dL   Calcium 9.5 8.9 - 10.3 mg/dL   Total Protein 7.2 6.5 - 8.1 g/dL   Albumin 4.2 3.5 - 5.0 g/dL   AST 23 15 - 41 U/L   ALT 23 14 - 54 U/L   Alkaline Phosphatase 97 38 - 126 U/L   Total Bilirubin 0.6 0.3 - 1.2 mg/dL   GFR calc non Af Amer >60 >60 mL/min   GFR calc Af Amer >60 >60 mL/min    Comment: (NOTE) The eGFR has been calculated using the CKD EPI equation. This calculation has not been validated in all clinical situations. eGFR's persistently <60 mL/min signify possible Chronic Kidney Disease.    Anion gap 11 5 - 15  CBC     Status: None   Collection Time: 06/19/16  1:47 PM  Result Value Ref Range   WBC 7.7 4.0 - 10.5 K/uL   RBC 4.66 3.87 - 5.11 MIL/uL   Hemoglobin 13.2 12.0 - 15.0 g/dL   HCT  40.7 36.0 - 46.0 %   MCV 87.3 78.0 - 100.0 fL   MCH 28.3 26.0 - 34.0 pg   MCHC 32.4 30.0 - 36.0 g/dL   RDW 13.2 11.5 - 15.5 %   Platelets 336 150 -  400 K/uL   No results found.  Review of Systems  Constitutional: Negative.   HENT: Negative.   Respiratory: Negative.   Cardiovascular: Negative.   Genitourinary: Negative.   Musculoskeletal: Positive for neck pain.  Skin: Negative.   Neurological: Negative.   Psychiatric/Behavioral: Negative.     There were no vitals taken for this visit. Physical Exam  Constitutional: She is oriented to person, place, and time. No distress.  HENT:  Head: Normocephalic and atraumatic.  Eyes: EOM are normal. Pupils are equal, round, and reactive to light.  Neck:  Decreased range of motion. Posterior neck tender to palpation.  Respiratory: No respiratory distress.  GI: She exhibits no distension.  Musculoskeletal:  Patient has pain with full flexion 50%. Upper extremity reflexes are 2+ and symmetrical. No biceps triceps wrist flexion extension weakness. Grip finger extension EPL is normal. No shoulder impingement. Pain with extension 50% of normal range. Increased pain with rotation   Neurological: She is alert and oriented to person, place, and time.  Skin: Skin is warm and dry.  Psychiatric: She has a normal mood and affect.     Assessment/Plan Cervical pseudoarthrosis at C6-7 and possible C5-6.   We will proceed with POSTERIOR CERVICAL FUSION C5-C7 SPINOUS PROCESS WIRING.  Surgical procedure along with possible rehab/recovery time discussed. All questions answered.  Benjiman Core, PA-C 06/21/2016, 12:26 PM

## 2016-06-21 NOTE — Progress Notes (Signed)
Orthopedic Tech Progress Note Patient Details:  Kara [name] [birth year] [id number]  Ortho Devices Type of Ortho Device: Soft collar Ortho Device/Splint Location: at bedside Ortho Device/Splint Interventions: Casandra Doffing 06/21/2016, 11:12 PM

## 2016-06-21 NOTE — Interval H&P Note (Signed)
History and Physical Interval Note:  06/21/2016 3:44 PM  Kara [name]  has presented today for surgery, with the diagnosis of CERVICAL PSEUDARTHROSIS  The various methods of treatment have been discussed with the patient and family. After consideration of risks, benefits and other options for treatment, the patient has consented to  Procedure(s): POSTERIOR CERVICAL FUSION C5-C7 SPINOUS PROCESS WIRING (N/A) as a surgical intervention .  The patient's history has been reviewed, patient examined, no change in status, stable for surgery.  I have reviewed the patient's chart and labs.  Questions were answered to the patient's satisfaction.     Eldred Manges

## 2016-06-21 NOTE — Brief Op Note (Signed)
06/21/2016  6:05 PM  PATIENT:  Kara [name]  61 y.o. female  PRE-OPERATIVE DIAGNOSIS:  CERVICAL PSEUDARTHROSIS  POST-OPERATIVE DIAGNOSIS:  CERVICAL PSEUDARTHROSIS  PROCEDURE:  Procedure(s): POSTERIOR CERVICAL FUSION C5-C7 SPINOUS PROCESS WIRING (N/A)  SURGEON:  Surgeon(s) and Role:    * Eldred Manges, MD - Primary  PHYSICIAN ASSISTANT:   ASSISTANTS: none   ANESTHESIA:   local and general  EBL:  Total I/O In: 1000 [I.V.:1000] Out: -   BLOOD ADMINISTERED:none  DRAINS: none   LOCAL MEDICATIONS USED:  MARCAINE     SPECIMEN:  No Specimen  DISPOSITION OF SPECIMEN:  N/A  COUNTS:  YES  TOURNIQUET:  * No tourniquets in log *  DICTATION: .Other Dictation: Dictation Number [id number]  PLAN OF CARE: Admit for overnight observation  PATIENT DISPOSITION:  PACU - hemodynamically stable.   Delay start of Pharmacological VTE agent (>24hrs) due to surgical blood loss or risk of bleeding: not applicable

## 2016-06-21 NOTE — Anesthesia Preprocedure Evaluation (Addendum)
Anesthesia Evaluation  Patient identified by MRN, date of birth, ID band Patient awake    Reviewed: Allergy & Precautions, NPO status , Patient's Chart, lab work & pertinent test results  Airway Mallampati: II  TM Distance: >3 FB Neck ROM: Limited    Dental  (+) Teeth Intact, Dental Advisory Given   Pulmonary asthma , former smoker,    breath sounds clear to auscultation       Cardiovascular negative cardio ROS   Rhythm:Regular Rate:Normal     Neuro/Psych PSYCHIATRIC DISORDERS Anxiety Depression  Neuromuscular disease    GI/Hepatic negative GI ROS, Neg liver ROS,   Endo/Other  negative endocrine ROS  Renal/GU   negative genitourinary   Musculoskeletal  (+) Arthritis , Rheumatoid disorders,  Fibromyalgia -  Abdominal Normal abdominal exam  (+)   Peds negative pediatric ROS (+)  Hematology negative hematology ROS (+)   Anesthesia Other Findings   Reproductive/Obstetrics negative OB ROS                             11/2015 EKG: normal sinus rhythm.  11/2015 Echo - Left ventricle: The cavity size was normal. Wall thickness was   normal. Systolic function was normal. The estimated ejection   fraction was in the range of 60% to 65%. Wall motion was normal;   there were no regional wall motion abnormalities. Doppler   parameters are consistent with abnormal left ventricular   relaxation (grade 1 diastolic dysfunction).  Anesthesia Physical  Anesthesia Plan  ASA: II  Anesthesia Plan: General   Post-op Pain Management:    Induction: Intravenous  Airway Management Planned: Oral ETT  Additional Equipment:   Intra-op Plan:   Post-operative Plan: Extubation in OR  Informed Consent: I have reviewed the patients History and Physical, chart, labs and discussed the procedure including the risks, benefits and alternatives for the proposed anesthesia with the patient or authorized  representative who has indicated his/her understanding and acceptance.   Dental advisory given  Plan Discussed with: CRNA  Anesthesia Plan Comments:         Anesthesia Quick Evaluation

## 2016-06-22 ENCOUNTER — Encounter (HOSPITAL_COMMUNITY): Payer: Self-pay | Admitting: General Practice

## 2016-06-22 ENCOUNTER — Telehealth (INDEPENDENT_AMBULATORY_CARE_PROVIDER_SITE_OTHER): Payer: Self-pay | Admitting: Orthopaedic Surgery

## 2016-06-22 ENCOUNTER — Observation Stay (HOSPITAL_COMMUNITY): Payer: Managed Care, Other (non HMO)

## 2016-06-22 DIAGNOSIS — M960 Pseudarthrosis after fusion or arthrodesis: Secondary | ICD-10-CM | POA: Diagnosis not present

## 2016-06-22 MED ORDER — METHOCARBAMOL 500 MG PO TABS: 500.0000 mg | ORAL_TABLET | Freq: Four times a day (QID) | ORAL | 0 refills | Status: DC

## 2016-06-22 MED ORDER — OXYCODONE-ACETAMINOPHEN 5-325 MG PO TABS: 1.0000 | ORAL_TABLET | Freq: Four times a day (QID) | ORAL | 0 refills | Status: DC | PRN

## 2016-06-22 MED ORDER — HYDROMORPHONE HCL 2 MG/ML IJ SOLN
1.5000 mg | INTRAMUSCULAR | Status: DC | PRN
  Administered 2016-06-22 – 2016-06-23 (×6): 1.5 mg via INTRAVENOUS
  Filled 2016-06-22 (×6): qty 1

## 2016-06-22 NOTE — Progress Notes (Signed)
Reviewed discharge papers and medications with full understanding 

## 2016-06-22 NOTE — Progress Notes (Signed)
Discharge discontinued due to pain per Dr Ophelia Charter

## 2016-06-22 NOTE — Discharge Instructions (Signed)
Use collar as needed. Office followup in one week.

## 2016-06-22 NOTE — Telephone Encounter (Signed)
Patient called extremely upset, wants to speak to someone as soon as possible. CB # [phone]

## 2016-06-22 NOTE — Progress Notes (Addendum)
Called Dr Ophelia Charter, regarding patient she is  crying in pain and needs more medications dr increase medication of Dil 1.5mg  patient still states that is not enough when pain medicine is given and checked within 15 minutes she is sleeping but wakes and tells me she is a 10 out of 10,  X-Ray came back with no issues Dr Ophelia Charter notified

## 2016-06-22 NOTE — Progress Notes (Signed)
Patient ID: Kara [name], female   DOB: October 09, 1955, 61 y.o.   MRN: [id number] c spine xrays reviewed. This showed good position of wire and Vitoss . No fx present. Plan discharge later today. She has been ambulatory. Dressing change today. Office one week.  Rx percocet and robaxin on chart.

## 2016-06-22 NOTE — Telephone Encounter (Signed)
I have seen her today on rounds, talked with her by phone X 4, talken multiple calls from Nurse and one from case Manager who are trying to provide and care for the patient and are doing an excellent job. [name] She was rapidly walking up and down the halls, sometimes crying, sometimes yelling per their reports. She called late last night  my partner's home  Phone wanting extra dilaudid more than 1mg , requested Nurse call another of my partners to get additional pain meds. I cancelled   her discharge, she got dose of IV dilaudid and as of 30 minutes ago is comfortable. O2 sat monitoring by nursing to make sure she does not desat with respiratory depression from [facility].   Patient related that I do not understand her fibromyalgia pain, states she could not move and had to stay still.  Currently she is doing better.  Plan is discharge in AM.

## 2016-06-22 NOTE — Progress Notes (Signed)
Patient ID: Kara [name], female   DOB: 1955-04-06, 61 y.o.   MRN: [id number] Due to continue complaint of uncontrolled pain, discharge cancelled. Saline lock reinserted and IV dilaudid given by Nurse. I talked to pt on the phone and  Pt states she is now comfortable. I have talked to her 4 times by phone. She is in good spirits and can be discharged in AM.  She has Hx of pain clinic with multiple narcotic meds at high doses and is very aware of her past Hx.   [place] narcotic list was checked before her surgery and she had been off narcotics since Sept 2017 after slow, difficult , narcotic wean after lumbar spine surgery. We discussed her low tolerance for pain , discussed using ice, muscle relaxant.  I have told her to not try to talk my partners into giving her more/different /stronger pain meds.(  Last PM she called one of my partners on his home phone. ).     My cell [phone]

## 2016-06-22 NOTE — Progress Notes (Signed)
Patient ID: Kara [name], female   DOB: 07/16/1955, 61 y.o.   MRN: [id number] I called pt. She is yelling over the phone and wants dilaudid given and have an IV put back in. She alternates with calm discussion and then resumes yelling and crying.  She has been off narcotics for at least 5 months and previously was on high doses.

## 2016-06-22 NOTE — Progress Notes (Signed)
Dr. Ophelia Charter called to ask about patient's pain medication. Patient is concerned that she will not have enough pain pills since she will have to take 2 at a time, and states that the medication does not work for her.

## 2016-06-22 NOTE — Progress Notes (Signed)
Dressing changed with meplex small amt of drainage noted, new clean collar applied, patient states she is ready to go home

## 2016-06-22 NOTE — Progress Notes (Signed)
Patient ID: Kara [name], female   DOB: [birth year], 61 y.o.   MRN: [id number] Darl Pikes case manager called and states pt walking up and down the hall rapidly.    Will call husband to see if he is available to be with her .

## 2016-06-22 NOTE — Telephone Encounter (Signed)
fyi

## 2016-06-22 NOTE — Progress Notes (Addendum)
   Subjective: 1 Day Post-Op Procedure(s) (LRB): POSTERIOR CERVICAL FUSION C5-C7 SPINOUS PROCESS WIRING (N/A) Patient reports pain as severe.  " I am in too much pain to go home. " Nurse note states pt awake as says pain is a 10 or she is asleep. Patient says she had crack in her neck with increase pain when walking back from [place].  Objective: Vital signs in last 24 hours: Temp:  [97.8 F (36.6 C)-98.3 F (36.8 C)] 97.9 F (36.6 C) (03/29 0454) Pulse Rate:  [68-80] 70 (03/29 0454) Resp:  [9-20] 18 (03/29 0454) BP: (100-130)/(49-77) 124/77 (03/29 0454) SpO2:  [92 %-100 %] 95 % (03/29 0454)  Intake/Output from previous day: 03/28 0701 - 03/29 0700 In: 2725 [I.V.:2725] Out: 50 [Blood:50] Intake/Output this shift: No intake/output data recorded.   Recent Labs  06/19/16 1347  HGB 13.2    Recent Labs  06/19/16 1347  WBC 7.7  RBC 4.66  HCT 40.7  PLT 336    Recent Labs  06/19/16 1338  NA 138  K 3.8  CL 103  CO2 24  BUN 12  CREATININE 0.77  GLUCOSE 135*  CALCIUM 9.5   No results for input(s): LABPT, INR in the last 72 hours.  Neurovascular intact Dg Cervical Spine 2-3 Views  Result Date: 06/21/2016 CLINICAL DATA:  C5 through C7 spinous process wiring EXAM: CERVICAL SPINE - 2-3 VIEW COMPARISON:  None. FINDINGS: Three C-arm fluoroscopic views of the cervical spine demonstrate cerclage wire fixation posteriorly from C5 through C7. ACDF with plate and screw fixation of from C5 through C7 anteriorly. Partially visualized shoulder arthroplasty. IMPRESSION: ACDF from C5 through C7 with cerclage wire encompassing the spinous processes at these levels. Electronically Signed   By: Tollie Eth M.D.   On: 06/21/2016 19:47    Assessment/Plan: 1 Day Post-Op Procedure(s) (LRB): POSTERIOR CERVICAL FUSION C5-C7 SPINOUS PROCESS WIRING (N/A) [place]   Check xrays of cervical spine. Home this afternoon.   Eldred Manges 06/22/2016, 7:25 AM

## 2016-06-23 DIAGNOSIS — M960 Pseudarthrosis after fusion or arthrodesis: Secondary | ICD-10-CM | POA: Diagnosis not present

## 2016-06-23 MED ORDER — HYDROMORPHONE HCL 1 MG/ML IJ SOLN: 1.5000 mg | INTRAMUSCULAR | Status: DC | PRN

## 2016-06-23 NOTE — Progress Notes (Signed)
Patient better this am Ice pack on neck Moves extremities Dc to home rx on chart

## 2016-06-26 ENCOUNTER — Encounter (HOSPITAL_COMMUNITY): Payer: Self-pay | Admitting: Orthopaedic Surgery

## 2016-06-27 NOTE — Discharge Summary (Signed)
Patient ID: Maliea Grandmaison MRN: [id number] DOB/AGE: 1955-04-03 61 y.o.  Admit date: 06/21/2016 Discharge date: 06/27/2016  Admission Diagnoses:  Active Problems:   Cervical pseudoarthrosis [facility])   Discharge Diagnoses:  Active Problems:   Cervical pseudoarthrosis (HCC)  status post Procedure(s): POSTERIOR CERVICAL FUSION C5-C7 SPINOUS PROCESS WIRING History of pain management with past history of chronic narcotic usage in remission. History of chronic fatigue and fibromyalgia per patient.  Past Medical History:  Diagnosis Date  . ADD (attention deficit disorder)    on Adderal  . Aggressive behavior of adult   . Allergy   . Anxiety   . Arthritis   . Asthma    as a child  . Cellulitis of breast 11/2013   RIGHT BREAST  . 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 Chronic kidney disease    due to infection from stone- no regular visits- fine now  . Chronic pain    goes to [facility] for pain control  . Cold sore   . 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 Depression    takes meds daily  . Family history of malignant neoplasm of breast   . Fibromyalgia   . Nephrolithiasis   . Osteoporosis   . Other specified rheumatoid arthritis, right shoulder (HCC) 08/01/2011  . Post-nasal drip    hx of  . Rheumatoid arthritis(714.0)    autoimmune arthritis; methotrexate once/week  . Urinary incontinence     Surgeries: Procedure(s): POSTERIOR CERVICAL FUSION C5-C7 SPINOUS PROCESS WIRING on 06/21/2016   Consultants:   Discharged Condition: Improved  Hospital Course: Abrielle Finck is an 61 y.o. female who was admitted 06/21/2016 for operative treatment of cervical psedoarthrosis. Patient failed conservative treatments (please see the history and physical for the specifics) and had severe unremitting pain that affects sleep, daily activities and work/hobbies. After pre-op clearance, the patient was taken to the operating  room on 06/21/2016 and underwent  Procedure(s): POSTERIOR CERVICAL FUSION C5-C7 SPINOUS PROCESS WIRING.    Patient was given perioperative antibiotics:  Anti-infectives    Start     Dose/Rate Route Frequency Ordered Stop   06/22/16 0600  ceFAZolin (ANCEF) IVPB 2g/100 mL premix  Status:  Discontinued     2 g 200 mL/hr over 30 Minutes Intravenous On call to O.R. 06/21/16 1236 06/21/16 2013   06/21/16 2200  ceFAZolin (ANCEF) IVPB 1 g/50 mL premix     1 g 100 mL/hr over 30 Minutes Intravenous Every 8 hours 06/21/16 2035 06/22/16 0607   06/21/16 1742  vancomycin (VANCOCIN) powder  Status:  Discontinued       As needed 06/21/16 1742 06/21/16 1823   06/21/16 1430  ceFAZolin (ANCEF) IVPB 2g/100 mL premix  Status:  Discontinued     2 g 200 mL/hr over 30 Minutes Intravenous On call to O.R. 06/21/16 0719 06/21/16 1236       Patient was given sequential compression devices and early ambulation to prevent DVT.   Patient benefited maximally from hospital stay and there were no complications. At the time of discharge, the patient was urinating/moving their bowels without difficulty, tolerating a regular diet, pain is controlled with oral pain medications and they have been cleared by PT/OT.   Recent vital signs: No data found.    Recent laboratory studies: No results for input(s): WBC, HGB, HCT, PLT, NA, K, CL, CO2, BUN, CREATININE, GLUCOSE, INR, CALCIUM in the last 72 hours.  Invalid  input(s): PT, 2   Discharge Medications:   Allergies as of 06/23/2016      Reactions   Aspirin Anaphylaxis   Ibuprofen Anaphylaxis   Ativan [lorazepam] Other (See Comments)   Makes agitated, combative   Ketamine Other (See Comments)   Hallucinations   Toradol [ketorolac Tromethamine] Itching, Swelling, Rash   UNSPECIFIED REACTION    Tramadol Hcl Itching, Swelling, Rash   SWELLING REACTION UNSPECIFIED       Medication List    TAKE these medications   ACTEMRA 162 MG/0.9ML Sosy Generic drug:   Tocilizumab Has stopped prior to procedure   ADDERALL XR 30 MG 24 hr capsule Generic drug:  amphetamine-dextroamphetamine Take 60 mg by mouth daily. Takes 2 capsules in the morning   amphetamine-dextroamphetamine 30 MG tablet Commonly known as:  ADDERALL Take 30 mg by mouth daily. Before 4 pm   ALPRAZolam 1 MG tablet Commonly known as:  XANAX Take 1 mg by mouth 3 (three) times daily as needed for anxiety.   cyclobenzaprine 10 MG tablet Commonly known as:  FLEXERIL TAKE 1 TABLET (10 MG TOTAL) BY MOUTH 3 (THREE) TIMES DAILY AS NEEDED FOR MUSCLE SPASMS.   diclofenac 75 MG EC tablet Commonly known as:  VOLTAREN Take 1 tablet (75 mg total) by mouth 2 (two) times daily. What changed:  when to take this   Diclofenac Sodium 2 % Soln Commonly known as:  PENNSAID Place 2 g onto the skin 4 (four) times daily as needed. What changed:  when to take this  reasons to take this   FLUoxetine 10 MG capsule Commonly known as:  PROZAC Take 10 mg by mouth daily.   methocarbamol 500 MG tablet Commonly known as:  ROBAXIN Take 1 tablet (500 mg total) by mouth 4 (four) times daily.   oxyCODONE 5 MG immediate release tablet Commonly known as:  Oxy IR/ROXICODONE Take 2 tablets (10 mg total) by mouth every 4 (four) hours as needed.   oxyCODONE-acetaminophen 5-325 MG tablet Commonly known as:  ROXICET Take 1-2 tablets by mouth every 6 (six) hours as needed for severe pain.   tretinoin 0.1 % cream Commonly known as:  RETIN-A Apply 1 application topically at bedtime. 90 Day Supply   valACYclovir 1000 MG tablet Commonly known as:  VALTREX Take two tablets onset of coldsore and repeat 2 tablets in 12 hours What changed:  how much to take  how to take this  when to take this  reasons to take this  additional instructions   venlafaxine XR 75 MG 24 hr capsule Commonly known as:  EFFEXOR-XR Take 150 mg by mouth daily with breakfast.   zolpidem 10 MG tablet Commonly known as:   AMBIEN TAKE 1 TABLET (10 mg) BY MOUTH AT BEDTIME AS NEEDED for sleep       Diagnostic Studies: Dg Cervical Spine 2 Or 3 Views  Result Date: 06/22/2016 CLINICAL DATA:  Status post cervical fusion EXAM: CERVICAL SPINE - 2-3 VIEW COMPARISON:  May 22, 2016 and intraoperative images June 21, 2016 FINDINGS: Frontal and lateral views were obtained. There is now posterior wire fixation at the spinous processes of C5, C6, and C7. There remains anterior screw and plate fixation at C5, C6, and C7. There are disc spacers at C5-6 and C6-7. No evident fracture. There is 2 mm of anterolisthesis of C3 on C4. No other spondylolisthesis. Prevertebral soft tissues and predental space regions are normal. There is marked disc space narrowing at C4-5 with moderately severe disc space narrowing  at C5-6 and C6-7. There is moderate disc space narrowing at C7-T1. No erosive change. Lung apices are clear. IMPRESSION: Postoperative change with new posterior fixation with wire from C5-C7. Anterior fusion from C5-C7 also present, unchanged. Support hardware intact. Multilevel arthropathy. Slight spondylolisthesis at C3-4 felt to be due to underlying spondylosis. No fracture. Electronically Signed   By: Bretta Bang III M.D.   On: 06/22/2016 08:21   Dg Cervical Spine 2-3 Views  Result Date: 06/21/2016 CLINICAL DATA:  C5 through C7 spinous process wiring EXAM: CERVICAL SPINE - 2-3 VIEW COMPARISON:  None. FINDINGS: Three C-arm fluoroscopic views of the cervical spine demonstrate cerclage wire fixation posteriorly from C5 through C7. ACDF with plate and screw fixation of from C5 through C7 anteriorly. Partially visualized shoulder arthroplasty. IMPRESSION: ACDF from C5 through C7 with cerclage wire encompassing the spinous processes at these levels. Electronically Signed   By: Tollie Eth M.D.   On: 06/21/2016 19:47    Discharge Instructions    Call MD / Call 911    Complete by:  As directed    If you experience chest  pain or shortness of breath, CALL 911 and be transported to the hospital emergency room.  If you develope a fever above 101 F, pus (white drainage) or increased drainage or redness at the wound, or calf pain, call your surgeon's office.   Constipation Prevention    Complete by:  As directed    Drink plenty of fluids.  Prune juice may be helpful.  You may use a stool softener, such as Colace (over the counter) 100 mg twice a day.  Use MiraLax (over the counter) for constipation as needed.   Diet - low sodium heart healthy    Complete by:  As directed    Increase activity slowly as tolerated    Complete by:  As directed       Follow-up Information    Eldred Manges, MD Follow up in 1 week(s).   Specialty:  Orthopedic Surgery Contact information: [address] [place] [address] [phone]           Discharge Plan:  discharge to home Disposition:     Signed: Zonia Kief for Dr Annell Greening 06/27/2016, 12:00 PM

## 2016-06-30 ENCOUNTER — Telehealth (INDEPENDENT_AMBULATORY_CARE_PROVIDER_SITE_OTHER): Payer: Self-pay | Admitting: Radiology

## 2016-06-30 MED ORDER — OXYCODONE-ACETAMINOPHEN 5-325 MG PO TABS: ORAL_TABLET | ORAL | 0 refills | Status: DC

## 2016-06-30 NOTE — Telephone Encounter (Signed)
rx refilled per Dr. Ophelia Charter #[phone] only

## 2016-06-30 NOTE — Telephone Encounter (Signed)
Please advise 

## 2016-06-30 NOTE — Telephone Encounter (Signed)
Ok FOR 15 MORE TABS

## 2016-06-30 NOTE — Telephone Encounter (Signed)
Patient needs a refill on her pain meds. States she was only given enough for 5 days postop and now she is in a lot of pain and would like to get this as soon as possible.  Call back # [phone], [phone]

## 2016-07-05 ENCOUNTER — Inpatient Hospital Stay (HOSPITAL_COMMUNITY)
Admission: EM | Admit: 2016-07-05 | Discharge: 2016-07-10 | DRG: 857 | Disposition: A | Discharge status: Home Health | Payer: Managed Care, Other (non HMO) | Attending: Internal Medicine | Admitting: Internal Medicine

## 2016-07-05 ENCOUNTER — Emergency Department (HOSPITAL_COMMUNITY): Payer: Managed Care, Other (non HMO)

## 2016-07-05 ENCOUNTER — Encounter (HOSPITAL_COMMUNITY): Payer: Self-pay

## 2016-07-05 ENCOUNTER — Telehealth (INDEPENDENT_AMBULATORY_CARE_PROVIDER_SITE_OTHER): Payer: Self-pay | Admitting: Radiology

## 2016-07-05 DIAGNOSIS — M797 Fibromyalgia: Secondary | ICD-10-CM | POA: Diagnosis not present

## 2016-07-05 DIAGNOSIS — T8140XA Infection following a procedure, unspecified, initial encounter: Secondary | ICD-10-CM | POA: Diagnosis present

## 2016-07-05 DIAGNOSIS — A4901 Methicillin susceptible Staphylococcus aureus infection, unspecified site: Secondary | ICD-10-CM | POA: Diagnosis present

## 2016-07-05 DIAGNOSIS — G8929 Other chronic pain: Secondary | ICD-10-CM | POA: Diagnosis present

## 2016-07-05 DIAGNOSIS — G894 Chronic pain syndrome: Secondary | ICD-10-CM | POA: Diagnosis not present

## 2016-07-05 DIAGNOSIS — R6510 Systemic inflammatory response syndrome (SIRS) of non-infectious origin without acute organ dysfunction: Secondary | ICD-10-CM | POA: Diagnosis not present

## 2016-07-05 DIAGNOSIS — Z765 Malingerer [conscious simulation]: Secondary | ICD-10-CM

## 2016-07-05 DIAGNOSIS — R7303 Prediabetes: Secondary | ICD-10-CM | POA: Diagnosis present

## 2016-07-05 DIAGNOSIS — Z96611 Presence of right artificial shoulder joint: Secondary | ICD-10-CM | POA: Diagnosis present

## 2016-07-05 DIAGNOSIS — F988 Other specified behavioral and emotional disorders with onset usually occurring in childhood and adolescence: Secondary | ICD-10-CM | POA: Diagnosis present

## 2016-07-05 DIAGNOSIS — L089 Local infection of the skin and subcutaneous tissue, unspecified: Secondary | ICD-10-CM | POA: Diagnosis present

## 2016-07-05 DIAGNOSIS — T814XXA Infection following a procedure, initial encounter: Principal | ICD-10-CM | POA: Diagnosis present

## 2016-07-05 DIAGNOSIS — Z7189 Other specified counseling: Secondary | ICD-10-CM

## 2016-07-05 DIAGNOSIS — Z791 Long term (current) use of non-steroidal anti-inflammatories (NSAID): Secondary | ICD-10-CM

## 2016-07-05 DIAGNOSIS — T148XXA Other injury of unspecified body region, initial encounter: Secondary | ICD-10-CM

## 2016-07-05 DIAGNOSIS — F4310 Post-traumatic stress disorder, unspecified: Secondary | ICD-10-CM | POA: Diagnosis present

## 2016-07-05 DIAGNOSIS — F329 Major depressive disorder, single episode, unspecified: Secondary | ICD-10-CM | POA: Diagnosis present

## 2016-07-05 DIAGNOSIS — N179 Acute kidney failure, unspecified: Secondary | ICD-10-CM | POA: Diagnosis present

## 2016-07-05 DIAGNOSIS — Z515 Encounter for palliative care: Secondary | ICD-10-CM

## 2016-07-05 DIAGNOSIS — N390 Urinary tract infection, site not specified: Secondary | ICD-10-CM | POA: Diagnosis not present

## 2016-07-05 DIAGNOSIS — Z79899 Other long term (current) drug therapy: Secondary | ICD-10-CM

## 2016-07-05 DIAGNOSIS — M069 Rheumatoid arthritis, unspecified: Secondary | ICD-10-CM | POA: Diagnosis present

## 2016-07-05 DIAGNOSIS — R443 Hallucinations, unspecified: Secondary | ICD-10-CM | POA: Diagnosis not present

## 2016-07-05 DIAGNOSIS — R8281 Pyuria: Secondary | ICD-10-CM | POA: Diagnosis present

## 2016-07-05 DIAGNOSIS — R52 Pain, unspecified: Secondary | ICD-10-CM | POA: Diagnosis not present

## 2016-07-05 DIAGNOSIS — IMO0001 Reserved for inherently not codable concepts without codable children: Secondary | ICD-10-CM

## 2016-07-05 DIAGNOSIS — Z981 Arthrodesis status: Secondary | ICD-10-CM

## 2016-07-05 DIAGNOSIS — Z87891 Personal history of nicotine dependence: Secondary | ICD-10-CM

## 2016-07-05 DIAGNOSIS — Y832 Surgical operation with anastomosis, bypass or graft as the cause of abnormal reaction of the patient, or of later complication, without mention of misadventure at the time of the procedure: Secondary | ICD-10-CM | POA: Diagnosis present

## 2016-07-05 LAB — COMPREHENSIVE METABOLIC PANEL
ALK PHOS: 115 U/L (ref 38–126)
ALT: 39 U/L (ref 14–54)
AST: 29 U/L (ref 15–41)
Albumin: 3.6 g/dL (ref 3.5–5.0)
Anion gap: 10 (ref 5–15)
BILIRUBIN TOTAL: 0.5 mg/dL (ref 0.3–1.2)
BUN: 23 mg/dL — AB (ref 6–20)
CO2: 22 mmol/L (ref 22–32)
Calcium: 9.1 mg/dL (ref 8.9–10.3)
Chloride: 102 mmol/L (ref 101–111)
Creatinine, Ser: 0.84 mg/dL (ref 0.44–1.00)
GFR calc Af Amer: >60 mL/min (ref >60)
Glucose, Bld: 116 mg/dL — ABNORMAL HIGH (ref 65–99)
Potassium: 4.3 mmol/L (ref 3.5–5.1)
Sodium: 134 mmol/L — ABNORMAL LOW (ref 135–145)
Total Protein: 6.6 g/dL (ref 6.5–8.1)

## 2016-07-05 LAB — URINALYSIS, ROUTINE W REFLEX MICROSCOPIC
BILIRUBIN URINE: NEGATIVE
Glucose, UA: NEGATIVE mg/dL
KETONES UR: NEGATIVE mg/dL
Nitrite: NEGATIVE
Protein, ur: NEGATIVE mg/dL
Specific Gravity, Urine: 1.01 (ref 1.005–1.030)
pH: 5 (ref 5.0–8.0)

## 2016-07-05 LAB — CBC WITH DIFFERENTIAL/PLATELET
BASOS ABS: 0 10*3/uL (ref 0.0–0.1)
Basophils Relative: 0 %
Eosinophils Absolute: 0.1 10*3/uL (ref 0.0–0.7)
Eosinophils Relative: 0 %
HCT: 38 % (ref 36.0–46.0)
Hemoglobin: 12.4 g/dL (ref 12.0–15.0)
LYMPHS PCT: 6 %
Lymphs Abs: 1.3 10*3/uL (ref 0.7–4.0)
MCH: 28.4 pg (ref 26.0–34.0)
MCHC: 32.6 g/dL (ref 30.0–36.0)
MCV: 87 fL (ref 78.0–100.0)
MONO ABS: 1.3 10*3/uL — AB (ref 0.1–1.0)
Monocytes Relative: 7 %
NEUTROS ABS: 17.3 10*3/uL — AB (ref 1.7–7.7)
Neutrophils Relative %: 87 %
Platelets: 341 10*3/uL (ref 150–400)
RBC: 4.37 MIL/uL (ref 3.87–5.11)
RDW: 13.3 % (ref 11.5–15.5)
WBC: 20 10*3/uL — ABNORMAL HIGH (ref 4.0–10.5)

## 2016-07-05 LAB — C-REACTIVE PROTEIN: CRP: 15.4 mg/dL — ABNORMAL HIGH (ref <1.0)

## 2016-07-05 LAB — I-STAT CG4 LACTIC ACID, ED
LACTIC ACID, VENOUS: 0.73 mmol/L (ref 0.5–1.9)
LACTIC ACID, VENOUS: 0.96 mmol/L (ref 0.5–1.9)

## 2016-07-05 LAB — SEDIMENTATION RATE: SED RATE: 31 mm/h — AB (ref 0–22)

## 2016-07-05 MED ORDER — VANCOMYCIN HCL IN DEXTROSE 1-5 GM/200ML-% IV SOLN
1000.0000 mg | Freq: Once | INTRAVENOUS | Status: AC
  Administered 2016-07-05: 1000 mg via INTRAVENOUS
  Filled 2016-07-05: qty 200

## 2016-07-05 MED ORDER — PIPERACILLIN-TAZOBACTAM 3.375 G IVPB 30 MIN
3.3750 g | Freq: Once | INTRAVENOUS | Status: AC
  Administered 2016-07-05: 3.375 g via INTRAVENOUS
  Filled 2016-07-05: qty 50

## 2016-07-05 MED ORDER — HYDROMORPHONE HCL 1 MG/ML IJ SOLN
2.0000 mg | Freq: Once | INTRAMUSCULAR | Status: AC
  Administered 2016-07-05: 2 mg via INTRAMUSCULAR
  Filled 2016-07-05: qty 2

## 2016-07-05 MED ORDER — ACETAMINOPHEN 500 MG PO TABS
1000.0000 mg | ORAL_TABLET | Freq: Once | ORAL | Status: AC
  Administered 2016-07-05: 1000 mg via ORAL
  Filled 2016-07-05: qty 2

## 2016-07-05 MED ORDER — SODIUM CHLORIDE 0.9 % IV BOLUS (SEPSIS)
1000.0000 mL | Freq: Once | INTRAVENOUS | Status: AC
  Administered 2016-07-05: 1000 mL via INTRAVENOUS

## 2016-07-05 MED ORDER — HYDROMORPHONE HCL 1 MG/ML IJ SOLN
1.0000 mg | INTRAMUSCULAR | Status: DC | PRN
  Administered 2016-07-05: 1 mg via INTRAVENOUS
  Filled 2016-07-05: qty 1

## 2016-07-05 NOTE — Telephone Encounter (Signed)
Patient called and she is in severe pain, her head is killing her and she cannot take the pain, it hurts to even move.  She is wearing her soft collar, and the incision on her hip appears ok.  Her temp is 99.9, she says she is going to the ED because she cannot take it anymore. FYI

## 2016-07-05 NOTE — ED Notes (Signed)
ED Provider at bedside. 

## 2016-07-05 NOTE — ED Triage Notes (Signed)
Pt from home with ems c.o generalized pain all over. Pt had a spinal fusion on c3-c7 last week and was sent home with a percocet prescription with no relief of her pain. Pt has hx of 3 fusions in her back and 2 in her neck. Pt also has hx of fibromyalgia. Pt ambulatory with EMS, a/ox4, VSS NAD

## 2016-07-05 NOTE — ED Notes (Signed)
Pt sleeping shortly after verifying her name for med admin. Still rates 10/10. Cannot give her birthday in one clean sentence, tripping over her words. Remains ambulatory, alertx4

## 2016-07-05 NOTE — ED Notes (Signed)
Attempted report 

## 2016-07-05 NOTE — ED Notes (Signed)
Pt unable to give urine specimen at this time 

## 2016-07-05 NOTE — ED Provider Notes (Signed)
[facility]-EMERGENCY DEPT Provider Note   CSN: [id number] Arrival date & time: 07/05/16  1638     History   Chief Complaint Chief Complaint  Patient presents with  . Back Pain    HPI Jaycee Luan is a 61 y.o. female.  The history is provided by the patient.  Back Pain   This is a recurrent problem. The current episode started more than 2 days ago. The problem occurs constantly. The problem has been rapidly worsening. The pain is associated with no known injury. Pain location: cervical spine. The quality of the pain is described as stabbing. The pain does not radiate. The pain is at a severity of 10/10. The pain is severe. The symptoms are aggravated by certain positions. The pain is the same all the time. Associated symptoms include tingling. Pertinent negatives include no fever, no bowel incontinence, no bladder incontinence, no paresthesias and no paresis. She has tried analgesics for the symptoms. The treatment provided no relief.   61 year old female with history of chronic pain, fibromyalgia who presents with neck pain and fibromyalgia pain. She has history of posterior cervical fusion C5-7 by Dr. Ophelia Charter 3/28. Discharged with percocet. States she has been having flare up of pain all over in her joints and muscles and escalating pain of her neck since after surgery, worse over 3 days. No fever or chills. Feels tingling in her hand occasionally. No focal weakness. No fall or injury. No bowel incontinence, retention of urine, urinary incontinence or saddle anesthesia.   Past Medical History:  Diagnosis Date  . ADD (attention deficit disorder)    on Adderal  . Aggressive behavior of adult   . Allergy   . Anxiety   . Arthritis   . Asthma    as a child  . Cellulitis of breast 11/2013   RIGHT BREAST  . 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 Chronic kidney disease    due to infection from stone- no regular visits- fine now  . Chronic pain    goes to [facility] for pain control  . Cold sore   . 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 Depression    takes meds daily  . Family history of malignant neoplasm of breast   . Fibromyalgia   . Nephrolithiasis   . Osteoporosis   . Other specified rheumatoid arthritis, right shoulder (HCC) 08/01/2011  . Post-nasal drip    hx of  . Rheumatoid arthritis(714.0)    autoimmune arthritis; methotrexate once/week  . Urinary incontinence     Patient Active Problem List   Diagnosis Date Noted  . SIRS (systemic inflammatory response syndrome) (HCC) 07/05/2016  . Wound infection 07/05/2016  . Cervical pseudoarthrosis (HCC) 06/21/2016  . Opioid dependence in remission (HCC) 06/14/2016  . Mitral valve prolapse 01/04/2016  . Skin infection 12/22/2015  . History of recent hospitalization 12/22/2015  . Altered mental status 12/13/2015  . HNP (herniated nucleus pulposus), lumbar 12/10/2015  . Prediabetes 08/19/2015  . BMI 26.0-26.9,adult 08/18/2015  . Chronic pain 08/18/2015  . Vitamin D deficiency 08/18/2015  . Atypical chest pain 08/18/2015  . Elevated BP 08/18/2015  . Chronic fatigue disorder 06/14/2015  . Nail fungus 06/14/2015  . S/P cervical spinal fusion 03/15/2015  . Spinal surgery in prior 3 months 07/03/2014  . PTSD (post-traumatic stress disorder) 03/07/2014  . Severe recurrent major depression without psychotic features (HCC) 03/06/2014  . Suicide threat or  attempt 03/06/2014  . Agitation 03/06/2014  . Aggressive behavior   . Family history of malignant neoplasm of breast   . Rheumatoid arthritis (HCC) 12/13/2011  . Fibromyalgia 12/13/2011    Past Surgical History:  Procedure Laterality Date  . ANTERIOR CERVICAL DECOMP/DISCECTOMY FUSION N/A 03/15/2015   Procedure: Cervical five-six, Cerival six-seven, Anterior Cervical Discectomy and Fusion, Allograft and Plate;  Surgeon: Eldred Manges, MD;  Location: [facility] OR;  Service: Orthopedics;  Laterality:  N/A;  . BACK SURGERY  2011,16   lower back fusion l4-5, s1  . BLADDER SURGERY  2009  . BREAST ENHANCEMENT SURGERY  2003  . breast implants removed    . BREAST SURGERY  10/2013   REMOVAL OF BREAST IMPLANTS  . INCISION AND DRAINAGE ABSCESS Right 01/16/2014   Procedure: INCISION AND DRAINAGE AND OF RIGHT BREAST ABCESS;  Surgeon: Glenna Fellows, MD;  Location: [facility] ORS;  Service: General;  Laterality: Right;  . JOINT REPLACEMENT    . LIPOSUCTION  2003   abdominal  . LUMBAR LAMINECTOMY/DECOMPRESSION MICRODISCECTOMY N/A 12/10/2015   Procedure: Right L3-4 Hemilaminectomy, Excision of herniated nucleus pulposus;  Surgeon: Eldred Manges, MD;  Location: [facility] OR;  Service: Orthopedics;  Laterality: N/A;  . MASS EXCISION  11/03/2011   Procedure: MINOR EXCISION OF MASS;  Surgeon: Wyn Forster., MD;  Location:  SURGERY CENTER;  Service: Orthopedics;  Laterality: Left;  debride IP joint, cyst excision left index  . POSTERIOR CERVICAL FUSION/FORAMINOTOMY N/A 06/21/2016   Procedure: POSTERIOR CERVICAL FUSION C5-C7 SPINOUS PROCESS WIRING;  Surgeon: Eldred Manges, MD;  Location: [facility] OR;  Service: Orthopedics;  Laterality: N/A;  . POSTERIOR FUSION CERVICAL SPINE  06/21/2016   C5 C7  . SHOULDER SURGERY  2012   right  . TONSILLECTOMY  1987  . TOTAL SHOULDER ARTHROPLASTY  08/01/2011   Procedure: TOTAL SHOULDER ARTHROPLASTY;  Surgeon: Eulas Post, MD;  Location: [facility] OR;  Service: Orthopedics;  Laterality: Right;  Right total shoulder arthroplasty  . TUBAL LIGATION  1988    OB History    No data available       Home Medications    Prior to Admission medications   Medication Sig Start Date End Date Taking? Authorizing Provider  ACTEMRA 162 MG/0.9ML SOSY Has stopped prior to procedure 04/11/16   Historical Provider, MD  ADDERALL XR 30 MG 24 hr capsule Take 60 mg by mouth daily. Takes 2 capsules in the morning 11/02/15   Historical Provider, MD  ALPRAZolam Prudy Feeler) 1 MG tablet Take 1 mg by mouth 3  (three) times daily as needed for anxiety.  07/06/14   Historical Provider, MD  amphetamine-dextroamphetamine (ADDERALL) 30 MG tablet Take 30 mg by mouth daily. Before 4 pm 04/29/16   Historical Provider, MD  cyclobenzaprine (FLEXERIL) 10 MG tablet TAKE 1 TABLET (10 MG TOTAL) BY MOUTH 3 (THREE) TIMES DAILY AS NEEDED FOR MUSCLE SPASMS. Patient not taking: Reported on 06/13/2016 02/11/16   Eldred Manges, MD  diclofenac (VOLTAREN) 75 MG EC tablet Take 1 tablet (75 mg total) by mouth 2 (two) times daily. Patient taking differently: Take 75 mg by mouth daily.  05/02/16   Eldred Manges, MD  Diclofenac Sodium (PENNSAID) 2 % SOLN Place 2 g onto the skin 4 (four) times daily as needed. Patient taking differently: Place 2 g onto the skin 2 (two) times daily as needed (joint pain).  02/24/16   Tarry Kos, MD  FLUoxetine (PROZAC) 10 MG capsule Take 10 mg by  mouth daily.    Historical Provider, MD  methocarbamol (ROBAXIN) 500 MG tablet Take 1 tablet (500 mg total) by mouth 4 (four) times daily. 06/22/16   Eldred Manges, MD  oxyCODONE (OXY IR/ROXICODONE) 5 MG immediate release tablet Take 2 tablets (10 mg total) by mouth every 4 (four) hours as needed. Patient not taking: Reported on 06/13/2016 02/24/16   Tarry Kos, MD  oxyCODONE-acetaminophen (ROXICET) 5-325 MG tablet Take one tablet every 6-8 hours as needed for severe pain. 06/30/16   Eldred Manges, MD  tretinoin (RETIN-A) 0.1 % cream Apply 1 application topically at bedtime. 90 Day Supply Patient not taking: Reported on 06/15/2016 08/18/15   Renee A Kuneff, DO  valACYclovir (VALTREX) 1000 MG tablet Take two tablets onset of coldsore and repeat 2 tablets in 12 hours Patient taking differently: Take 1,000 mg by mouth 2 (two) times daily as needed. Take two tablets onset of coldsore and repeat 2 tablets in 12 hours  01/13/16   Renee A Kuneff, DO  venlafaxine XR (EFFEXOR-XR) 75 MG 24 hr capsule Take 150 mg by mouth daily with breakfast.     Historical Provider, MD    zolpidem (AMBIEN) 10 MG tablet TAKE 1 TABLET (10 mg) BY MOUTH AT BEDTIME AS NEEDED for sleep 05/23/15   Historical Provider, MD    Family History Family History  Problem Relation Age of Onset  . Arthritis Mother   . Heart disease Mother     ?psvt  . Breast cancer Mother 73    TAH/BSO  . Cancer Mother   . Mental illness Mother   . COPD Father   . Hypertension Father   . Alcohol abuse Father   . Mental illness Father   . Heart disease Father   . Healthy Daughter   . Breast cancer Maternal Aunt 27    deceased  . Cancer Cousin 39    female; unknown primary  . Colon cancer Paternal Aunt 79    deceased at 18  . Stomach cancer Paternal Uncle 58    deceased at 57  . Alcohol abuse Brother   . Cancer Brother   . Hodgkin's lymphoma Brother   . Mental illness Brother   . HIV Brother   . Healthy Brother   . Healthy Son     Social History Social History  Substance Use Topics  . Smoking status: Former Smoker    Packs/day: 0.50    Years: 10.00    Types: Cigarettes    Quit date: 12/06/2005  . Smokeless tobacco: Never Used     Comment: smoked for 10 years  . Alcohol use No     Comment: denied alcohol     Allergies   Aspirin; Ibuprofen; Ativan [lorazepam]; Ketamine; Toradol [ketorolac tromethamine]; and Tramadol hcl   Review of Systems Review of Systems  Constitutional: Negative for fever.  Gastrointestinal: Negative for bowel incontinence.  Genitourinary: Negative for bladder incontinence.  Musculoskeletal: Positive for back pain.  Neurological: Positive for tingling. Negative for paresthesias.  All other systems reviewed and are negative.    Physical Exam Updated Vital Signs BP 138/70 (BP Location: Left Arm)   Pulse 90   Temp (!) 101.2 F (38.4 C) (Rectal)   Resp 16   Ht 5\' 4"  (1.626 m)   Wt 159 lb (72.1 kg)   SpO2 95%   BMI 27.29 kg/m   Physical Exam Physical Exam  Nursing note and vitals reviewed. Constitutional: Well developed, well nourished,  non-toxic, and in no  acute distress Head: Normocephalic and atraumatic.  Mouth/Throat: Oropharynx is clear and moist.  Neck: Normal range of motion. Neck supple. posterior cervical midline wound in tact with scant serous drainage and mild overlying erythema.  Cardiovascular: Normal rate and regular rhythm.   Pulmonary/Chest: Effort normal and breath sounds normal.  Abdominal: Soft. There is no tenderness. There is no rebound and no guarding.  Musculoskeletal: No deformities.  Neurological: Alert, no facial droop, fluent speech, full strength in bilateral upper extremities and lower extremities, sensation to light touch in tact in upper and lower extremities. Skin: Skin is warm and dry.  Psychiatric: Cooperative    ED Treatments / Results  Labs (all labs ordered are listed, but only abnormal results are displayed) Labs Reviewed  CBC WITH DIFFERENTIAL/PLATELET - Abnormal; Notable for the following:       Result Value   WBC 20.0 (*)    Neutro Abs 17.3 (*)    Monocytes Absolute 1.3 (*)    All other components within normal limits  COMPREHENSIVE METABOLIC PANEL - Abnormal; Notable for the following:    Sodium 134 (*)    Glucose, Bld 116 (*)    BUN 23 (*)    All other components within normal limits  SEDIMENTATION RATE - Abnormal; Notable for the following:    Sed Rate 31 (*)    All other components within normal limits  C-REACTIVE PROTEIN - Abnormal; Notable for the following:    CRP 15.4 (*)    All other components within normal limits  URINE CULTURE  CULTURE, BLOOD (ROUTINE X 2)  CULTURE, BLOOD (ROUTINE X 2)  URINALYSIS, ROUTINE W REFLEX MICROSCOPIC  I-STAT CG4 LACTIC ACID, ED  I-STAT CG4 LACTIC ACID, ED    EKG  EKG Interpretation None       Radiology Dg Chest 2 View  Result Date: 07/05/2016 CLINICAL DATA:  Postop fever for 3 days. Status post cervical spine fusion. EXAM: CHEST  2 VIEW COMPARISON:  12/13/2015 FINDINGS: Low lung volumes noted with mild bibasilar  atelectasis. No evidence of pulmonary consolidation or pleural effusion. Heart size and mediastinal contours are within normal limits. Right shoulder prosthesis and cervical spine fusion hardware again noted. IMPRESSION: Low lung volumes with mild bibasilar atelectasis. Electronically Signed   By: Myles Rosenthal M.D.   On: 07/05/2016 18:49    Procedures Procedures (including critical care time)  Medications Ordered in ED Medications  HYDROmorphone (DILAUDID) injection 1 mg (1 mg Intravenous Given 07/05/16 2057)  vancomycin (VANCOCIN) IVPB 1000 mg/200 mL premix (not administered)  piperacillin-tazobactam (ZOSYN) IVPB 3.375 g (not administered)  HYDROmorphone (DILAUDID) injection 2 mg (2 mg Intramuscular Given 07/05/16 1727)  acetaminophen (TYLENOL) tablet 1,000 mg (1,000 mg Oral Given 07/05/16 1909)  sodium chloride 0.9 % bolus 1,000 mL (0 mLs Intravenous Stopped 07/05/16 2008)     Initial Impression / Assessment and Plan / ED Course  I have reviewed the triage vital signs and the nursing notes.  Pertinent labs & imaging results that were available during my care of the patient were reviewed by me and considered in my medical decision making (see chart for details).      I reviewed the available records in [facility] and also Care Everywhere, updated the chart. Ambulatory for EMS prior to arrival. Review of records, during hospitalization pain was difficult to control. She was discharged home with Percocet, recently refilled by Dr. Ophelia Charter.  She does have a fever here of 101.2 Fahrenheit. With leukocytosis of 20 but normal lactate. Concern for  postop wound infection as there is some overlying erythema and induration around the wound. Discussed with Dr. Lajoyce Corners who is on-call for Dr. Ophelia Charter. Plan to have Dr. Ophelia Charter see patient in the hospital for evaluation. In the meantime we'll start her on IV antibiotics, including vancomycin and Zosyn.  UA is currently pending. Chest x-ray visualized and shows no evidence  of pneumonia or other acute cardiopulmonary processes.  Discussed with Dr. Adela Glimpse, who will admit for ongoing management.  Final Clinical Impressions(s) / ED Diagnoses   Final diagnoses:  Postoperative wound infection, initial encounter    New Prescriptions New Prescriptions   No medications on file     Lavera Guise, MD 07/05/16 2207

## 2016-07-05 NOTE — ED Notes (Addendum)
Pt's daughter just screamed and cursed at this RN . Daughter is demanding that this RN give the pt an unsafe dose of pain Mx.  Pt states she is not in pain.  Pt daughter is Production manager. It was explained to the daughter that her mother's Vitals signs are not stable enough to get a dose of pain Mx at this time.  Yet again talking to pt she states she is not in pain.  Pt only seems to be uncomfortable when daughter is around. Daughter states we have done nothing for her mother while she has been in the ED.

## 2016-07-05 NOTE — H&P (Signed)
Kara [name] ZOX:[id number] DOB: 01/12/1956 DOA: 07/05/2016     PCP: Felix Pacini, DO   Outpatient Specialists: Cardiology Allyson Sabal, Orthopedics [facility]  Patient coming from   home Lives  With family    Chief Complaint: pain all over   HPI: Kara [name] is a 61 y.o. female with medical history significant of neck pain, fibromyalgia, history of opioid dependence, rheumatoid arthritis sent cervical fusion C5 C7 spinous process wiring done on 28 th of March 2018  Presented with severe pain all over but worse in her head and neck and low grade fevers at home 99.9 starting at 4 PM. She reports some drainage from the neck wound started after the operation Patient states Percocet did not help her pain at home. Her pain has been getting worse. Denies any injury or falls. Worse pain in her neck. Describes pain as 10 out of 10. Intolerance worse with movements. She have not had any bowel bladder incontinence or paresthesias or weakness. States Percocet does not help her.  Denies sick contacts but reports muscle aches and mild cough. Denies diuria.  Have had very hard time with pain control during hospitalization. Was found to be febrile in the emergency department up to 101.2 with leukocytosis up to 20 receptor postop for an infection Regarding pertinent Chronic problems: Patient has recently undergone C5 C7 spinous process wiring cervical fusion while hospitalized she have had problems with drug seeking behavior On 6 of April patient has called Dr. Kevan Ny office requesting pain medications and was given 15 tablets more  IN ER:  Temp (24hrs), Avg:100.3 F (37.9 C), Min:99.8 F (37.7 C), Max:101.2 F (38.4 C)      96% on room air at bedtime 79 BP 99/73  WBC 20 hemoglobin 12.4 sodium 134 BUNs 23 cr 0.84 Sedimentation rate 31 CRP 15.4  CXR non acute Following Medications were ordered in ER: Medications  HYDROmorphone (DILAUDID) injection 1 mg (1 mg Intravenous Given 07/05/16 2057)  vancomycin  (VANCOCIN) IVPB 1000 mg/200 mL premix (not administered)  piperacillin-tazobactam (ZOSYN) IVPB 3.375 g (not administered)  HYDROmorphone (DILAUDID) injection 2 mg (2 mg Intramuscular Given 07/05/16 1727)  acetaminophen (TYLENOL) tablet 1,000 mg (1,000 mg Oral Given 07/05/16 1909)  sodium chloride 0.9 % bolus 1,000 mL (0 mLs Intravenous Stopped 07/05/16 2008)     ER provider discussed case with: Lajoyce Corners who recommends admission for IV vancomycin and Zosyn for plan for Dr. Kevan Ny to see in the morning hold off on imaging his is likely to be not helpful  Hospitalist was called for admission for SIRS with possible wound infection and Pyuria  Review of Systems:    Pertinent positives include: pain all over, pain in the neck  Constitutional:  No weight loss, night sweats, Fevers, chills, fatigue, weight loss  HEENT:  No headaches, Difficulty swallowing,Tooth/dental problems,Sore throat,  No sneezing, itching, ear ache, nasal congestion, post nasal drip,  Cardio-vascular:  No chest pain, Orthopnea, PND, anasarca, dizziness, palpitations.no Bilateral lower extremity swelling  GI:  No heartburn, indigestion, abdominal pain, nausea, vomiting, diarrhea, change in bowel habits, loss of appetite, melena, blood in stool, hematemesis Resp:  no shortness of breath at rest. No dyspnea on exertion, No excess mucus, no productive cough, No non-productive cough, No coughing up of blood.No change in color of mucus.No wheezing. Skin:  no rash or lesions. No jaundice GU:  no dysuria, change in color of urine, no urgency or frequency. No straining to urinate.  No flank pain.  Musculoskeletal:  No joint  pain or no joint swelling. No decreased range of motion. No back pain.  Psych:  No change in mood or affect. No depression or anxiety. No memory loss.  Neuro: no localizing neurological complaints, no tingling, no weakness, no double vision, no gait abnormality, no slurred speech, no confusion  As per HPI  otherwise 10 point review of systems negative.   Past Medical History: Past Medical History:  Diagnosis Date  . ADD (attention deficit disorder)    on Adderal  . Aggressive behavior of adult   . Allergy   . Anxiety   . Arthritis   . Asthma    as a child  . Cellulitis of breast 11/2013   RIGHT BREAST  . 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 Chronic kidney disease    due to infection from stone- no regular visits- fine now  . Chronic pain    goes to [facility] for pain control  . Cold sore   . 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 Depression    takes meds daily  . Family history of malignant neoplasm of breast   . Fibromyalgia   . Nephrolithiasis   . Osteoporosis   . Other specified rheumatoid arthritis, right shoulder (HCC) 08/01/2011  . Post-nasal drip    hx of  . Rheumatoid arthritis(714.0)    autoimmune arthritis; methotrexate once/week  . Urinary incontinence    Past Surgical History:  Procedure Laterality Date  . ANTERIOR CERVICAL DECOMP/DISCECTOMY FUSION N/A 03/15/2015   Procedure: Cervical five-six, Cerival six-seven, Anterior Cervical Discectomy and Fusion, Allograft and Plate;  Surgeon: Eldred Manges, MD;  Location: [facility] OR;  Service: Orthopedics;  Laterality: N/A;  . BACK SURGERY  2011,16   lower back fusion l4-5, s1  . BLADDER SURGERY  2009  . BREAST ENHANCEMENT SURGERY  2003  . breast implants removed    . BREAST SURGERY  10/2013   REMOVAL OF BREAST IMPLANTS  . INCISION AND DRAINAGE ABSCESS Right 01/16/2014   Procedure: INCISION AND DRAINAGE AND OF RIGHT BREAST ABCESS;  Surgeon: Glenna Fellows, MD;  Location: [facility];  Service: General;  Laterality: Right;  . JOINT REPLACEMENT    . LIPOSUCTION  2003   abdominal  . LUMBAR LAMINECTOMY/DECOMPRESSION MICRODISCECTOMY N/A 12/10/2015   Procedure: Right L3-4 Hemilaminectomy, Excision of herniated nucleus pulposus;  Surgeon: Eldred Manges, MD;   Location: [facility] OR;  Service: Orthopedics;  Laterality: N/A;  . MASS EXCISION  11/03/2011   Procedure: MINOR EXCISION OF MASS;  Surgeon: Wyn Forster., MD;  Location: [facility];  Service: Orthopedics;  Laterality: Left;  debride IP joint, cyst excision left index  . POSTERIOR CERVICAL FUSION/FORAMINOTOMY N/A 06/21/2016   Procedure: POSTERIOR CERVICAL FUSION C5-C7 SPINOUS PROCESS WIRING;  Surgeon: Eldred Manges, MD;  Location: [facility] OR;  Service: Orthopedics;  Laterality: N/A;  . POSTERIOR FUSION CERVICAL SPINE  06/21/2016   C5 C7  . SHOULDER SURGERY  2012   right  . TONSILLECTOMY  1987  . TOTAL SHOULDER ARTHROPLASTY  08/01/2011   Procedure: TOTAL SHOULDER ARTHROPLASTY;  Surgeon: Eulas Post, MD;  Location: [facility] OR;  Service: Orthopedics;  Laterality: Right;  Right total shoulder arthroplasty  . TUBAL LIGATION  1988     Social History:  Ambulatory  walker       reports that she quit smoking about 10 years ago. Her smoking use included Cigarettes. She has a  5.00 pack-year smoking history. She has never used smokeless tobacco. She reports that she does not drink alcohol or use drugs.  Allergies:   Allergies  Allergen Reactions  . Aspirin Anaphylaxis  . Ibuprofen Anaphylaxis  . Ativan [Lorazepam] Other (See Comments)    Makes agitated, combative   . Ketamine Other (See Comments)    Hallucinations  . Toradol [Ketorolac Tromethamine] Itching, Swelling and Rash    UNSPECIFIED REACTION   . Tramadol Hcl Itching, Swelling and Rash    SWELLING REACTION UNSPECIFIED        Family History:   Family History  Problem Relation Age of Onset  . Arthritis Mother   . Heart disease Mother     ?psvt  . Breast cancer Mother 80    TAH/BSO  . Cancer Mother   . Mental illness Mother   . COPD Father   . Hypertension Father   . Alcohol abuse Father   . Mental illness Father   . Heart disease Father   . Healthy Daughter   . Breast cancer Maternal Aunt 27    deceased  .  Cancer Cousin 64    female; unknown primary  . Colon cancer Paternal Aunt 109    deceased at 30  . Stomach cancer Paternal Uncle 31    deceased at 22  . Alcohol abuse Brother   . Cancer Brother   . Hodgkin's lymphoma Brother   . Mental illness Brother   . HIV Brother   . Healthy Brother   . Healthy Son     Medications: Prior to Admission medications   Medication Sig Start Date End Date Taking? Authorizing Provider  ACTEMRA 162 MG/0.9ML SOSY Has stopped prior to procedure 04/11/16   Historical Provider, MD  ADDERALL XR 30 MG 24 hr capsule Take 60 mg by mouth daily. Takes 2 capsules in the morning 11/02/15   Historical Provider, MD  ALPRAZolam Prudy Feeler) 1 MG tablet Take 1 mg by mouth 3 (three) times daily as needed for anxiety.  07/06/14   Historical Provider, MD  amphetamine-dextroamphetamine (ADDERALL) 30 MG tablet Take 30 mg by mouth daily. Before 4 pm 04/29/16   Historical Provider, MD  cyclobenzaprine (FLEXERIL) 10 MG tablet TAKE 1 TABLET (10 MG TOTAL) BY MOUTH 3 (THREE) TIMES DAILY AS NEEDED FOR MUSCLE SPASMS. Patient not taking: Reported on 06/13/2016 02/11/16   Eldred Manges, MD  diclofenac (VOLTAREN) 75 MG EC tablet Take 1 tablet (75 mg total) by mouth 2 (two) times daily. Patient taking differently: Take 75 mg by mouth daily.  05/02/16   Eldred Manges, MD  Diclofenac Sodium (PENNSAID) 2 % SOLN Place 2 g onto the skin 4 (four) times daily as needed. Patient taking differently: Place 2 g onto the skin 2 (two) times daily as needed (joint pain).  02/24/16   Tarry Kos, MD  FLUoxetine (PROZAC) 10 MG capsule Take 10 mg by mouth daily.    Historical Provider, MD  methocarbamol (ROBAXIN) 500 MG tablet Take 1 tablet (500 mg total) by mouth 4 (four) times daily. 06/22/16   Eldred Manges, MD  oxyCODONE (OXY IR/ROXICODONE) 5 MG immediate release tablet Take 2 tablets (10 mg total) by mouth every 4 (four) hours as needed. Patient not taking: Reported on 06/13/2016 02/24/16   Tarry Kos, MD    oxyCODONE-acetaminophen (ROXICET) 5-325 MG tablet Take one tablet every 6-8 hours as needed for severe pain. 06/30/16   Eldred Manges, MD  tretinoin (RETIN-A) 0.1 % cream  Apply 1 application topically at bedtime. 90 Day Supply Patient not taking: Reported on 06/15/2016 08/18/15   Renee A Kuneff, DO  valACYclovir (VALTREX) 1000 MG tablet Take two tablets onset of coldsore and repeat 2 tablets in 12 hours Patient taking differently: Take 1,000 mg by mouth 2 (two) times daily as needed. Take two tablets onset of coldsore and repeat 2 tablets in 12 hours  01/13/16   Renee A Kuneff, DO  venlafaxine XR (EFFEXOR-XR) 75 MG 24 hr capsule Take 150 mg by mouth daily with breakfast.     Historical Provider, MD  zolpidem (AMBIEN) 10 MG tablet TAKE 1 TABLET (10 mg) BY MOUTH AT BEDTIME AS NEEDED for sleep 05/23/15   Historical Provider, MD    Physical Exam: Patient Vitals for the past 24 hrs:  BP Temp Temp src Pulse Resp SpO2 Height Weight  07/05/16 1758 - (!) 101.2 F (38.4 C) Rectal - - - - -  07/05/16 1745 - 100 F (37.8 C) Oral - - - - -  07/05/16 1652 - - - - - - 5\' 4"  (1.626 m) 72.1 kg (159 lb)  07/05/16 1646 138/70 99.8 F (37.7 C) Oral 90 16 95 % - -  07/05/16 1642 - - - - - 97 % - -    1. General:  in No Acute distress 2. Psychological: somnolent but Oriented 3. Head/ENT:     Dry Mucous Membranes                          Head Non traumatic, neck supple                          Normal   Dentition 4. SKIN:  decreased Skin turgor,  Skin clean Dry wound with some induration and serosanguinous drainage 5. Heart: Regular rate and rhythm no Murmur, Rub or gallop 6. Lungs: Clear to auscultation bilaterally, no wheezes or crackles   7. Abdomen: Soft, non-tender, Non distended 8. Lower extremities: no clubbing, cyanosis, or edema 9. Neurologically Grossly intact, moving all 4 extremities equally   10. MSK: Normal range of motion   body mass index is 27.29 kg/m.  Labs on Admission:   Labs on  Admission: I have personally reviewed following labs and imaging studies  CBC:  Recent Labs Lab 07/05/16 1817  WBC 20.0*  NEUTROABS 17.3*  HGB 12.4  HCT 38.0  MCV 87.0  PLT 341   Basic Metabolic Panel:  Recent Labs Lab 07/05/16 1817  NA 134*  K 4.3  CL 102  CO2 22  GLUCOSE 116*  BUN 23*  CREATININE 0.84  CALCIUM 9.1   GFR: Estimated Creatinine Clearance: 70.2 mL/min (by C-G formula based on SCr of 0.84 mg/dL). Liver Function Tests:  Recent Labs Lab 07/05/16 1817  AST 29  ALT 39  ALKPHOS 115  BILITOT 0.5  PROT 6.6  ALBUMIN 3.6   No results for input(s): LIPASE, AMYLASE in the last 168 hours. No results for input(s): AMMONIA in the last 168 hours. Coagulation Profile: No results for input(s): INR, PROTIME in the last 168 hours. Cardiac Enzymes: No results for input(s): CKTOTAL, CKMB, CKMBINDEX, TROPONINI in the last 168 hours. BNP (last 3 results) No results for input(s): PROBNP in the last 8760 hours. HbA1C: No results for input(s): HGBA1C in the last 72 hours. CBG: No results for input(s): GLUCAP in the last 168 hours. Lipid Profile: No results for input(s): CHOL,  HDL, LDLCALC, TRIG, CHOLHDL, LDLDIRECT in the last 72 hours. Thyroid Function Tests: No results for input(s): TSH, T4TOTAL, FREET4, T3FREE, THYROIDAB in the last 72 hours. Anemia Panel: No results for input(s): VITAMINB12, FOLATE, FERRITIN, TIBC, IRON, RETICCTPCT in the last 72 hours. Urine analysis:    Component Value Date/Time   COLORURINE YELLOW 12/13/2015 1630   APPEARANCEUR CLEAR 12/13/2015 1630   LABSPEC 1.023 12/13/2015 1630   PHURINE 5.5 12/13/2015 1630   GLUCOSEU NEGATIVE 12/13/2015 1630   GLUCOSEU NEGATIVE 08/20/2009 1324   HGBUR NEGATIVE 12/13/2015 1630   BILIRUBINUR NEGATIVE 12/13/2015 1630   BILIRUBINUR neg 08/04/2013 1520   KETONESUR NEGATIVE 12/13/2015 1630   PROTEINUR NEGATIVE 12/13/2015 1630   UROBILINOGEN 0.2 06/26/2014 1553   NITRITE NEGATIVE 12/13/2015 1630    LEUKOCYTESUR NEGATIVE 12/13/2015 1630   Sepsis Labs: @LABRCNTIP (procalcitonin:4,lacticidven:4) )No results found for this or any previous visit (from the past 240 hour(s)).    UA WBC too numerous to count  Lab Results  Component Value Date   HGBA1C 5.8 (H) 12/14/2015    Estimated Creatinine Clearance: 70.2 mL/min (by C-G formula based on SCr of 0.84 mg/dL).  BNP (last 3 results) No results for input(s): PROBNP in the last 8760 hours.   ECG REPORT Not obtained  Filed Weights   07/05/16 1652  Weight: 72.1 kg (159 lb)     Cultures:    Component Value Date/Time   SDES BLOOD RIGHT HAND 07/18/2014 1839   SPECREQUEST BOTTLES DRAWN AEROBIC AND ANAEROBIC 5CC 07/18/2014 1839   CULT  07/18/2014 1839    NO GROWTH 5 DAYS Performed at [facility]    REPTSTATUS 07/25/2014 FINAL 07/18/2014 1839     Radiological Exams on Admission: Dg Chest 2 View  Result Date: 07/05/2016 CLINICAL DATA:  Postop fever for 3 days. Status post cervical spine fusion. EXAM: CHEST  2 VIEW COMPARISON:  12/13/2015 FINDINGS: Low lung volumes noted with mild bibasilar atelectasis. No evidence of pulmonary consolidation or pleural effusion. Heart size and mediastinal contours are within normal limits. Right shoulder prosthesis and cervical spine fusion hardware again noted. IMPRESSION: Low lung volumes with mild bibasilar atelectasis. Electronically Signed   By: Myles Rosenthal M.D.   On: 07/05/2016 18:49    Chart has been reviewed    Assessment/Plan  61 y.o. female with medical history significant of neck pain, fibromyalgia, history of opioid dependence, rheumatoid arthritis sent cervical fusion C5 C7 spinous process wiring done on 28 th of March 2018 admitted for SIRS possibly due to wound infecton  Present on Admission: . Wound infection continue Vanco and Zosyn appreciate orthopedics consult to reevaluate for possible wound infection,  . Chronic pain - difficult to manage history of opioid  dependence and drug-seeking behavior, we'll try to avoid IV narcotics if not have any evidence of vomiting or nausea and able to tolerate by mouth currently somnolent but arousable appears resting. Soft blood pressures will hold off on IV dilaudid for now. Try to use oxycodone 10 mg q4h  And titrate as needed.  . Fibromyalgia continue home medications . Prediabetes order sliding scale . SIRS (systemic inflammatory response syndrome) (HCC) slightly secondary to wound infection will continue broad-spectrum antibiotics await results of blood cultures  . Pyuria await results of urine culture   Other plan as per orders.  DVT prophylaxis:  SCD    Code Status:  FULL CODE as per patient    Family Communication:   Family  Daughter at  Bedside. Daughter is requesting IV Dilaudid for  her mother and Ambien to help her sleep.  Had extensive conversation with the family educated regarding risks of oversedation with Narcotics as well as benzodiazepines such as hypoxia, hypotension need for intubation or even possible death. Explained to family that I am not comfortable administering IV Dilaudid to hypotensive patient showing signs of sedation. Daughter states she understands but want to make sure that her mother is not in pain and that "shots" work best. Explained to the family that patient's safety is my priority and I will reassess her condition as needed and we will treat her pain accordingly.     Disposition Plan:   To home once workup is complete and patient is stable                              Consults called: Orthopedics ER spoke with Dr. Lajoyce Corners  Admission status:  inpatient      Level of care      medical floor      I have spent a total of 73 min on this admission  Extra time was taken to have conversation with the family. Jason Frisbee 07/05/2016, 11:13 PM   [facility]  Pager [phone]   after 2 AM please page floor coverage PA If 7AM-7PM, please contact the day team taking  care of the patient  [email or web address]  Password [email or web address]

## 2016-07-05 NOTE — ED Notes (Signed)
Patient transported to X-ray 

## 2016-07-05 NOTE — ED Notes (Signed)
Pt unable to give urine

## 2016-07-05 NOTE — ED Notes (Addendum)
Pt daughter is talking loudly and cursing on the phone about lack of care for her mother. Pt's daughter states she wants her mother to be sedated.

## 2016-07-06 ENCOUNTER — Encounter (HOSPITAL_COMMUNITY): Payer: Self-pay | Admitting: General Practice

## 2016-07-06 DIAGNOSIS — R6510 Systemic inflammatory response syndrome (SIRS) of non-infectious origin without acute organ dysfunction: Secondary | ICD-10-CM | POA: Diagnosis not present

## 2016-07-06 DIAGNOSIS — F988 Other specified behavioral and emotional disorders with onset usually occurring in childhood and adolescence: Secondary | ICD-10-CM | POA: Diagnosis present

## 2016-07-06 DIAGNOSIS — G8929 Other chronic pain: Secondary | ICD-10-CM | POA: Diagnosis present

## 2016-07-06 DIAGNOSIS — T148XXA Other injury of unspecified body region, initial encounter: Secondary | ICD-10-CM | POA: Diagnosis not present

## 2016-07-06 DIAGNOSIS — Z791 Long term (current) use of non-steroidal anti-inflammatories (NSAID): Secondary | ICD-10-CM | POA: Diagnosis not present

## 2016-07-06 DIAGNOSIS — Z885 Allergy status to narcotic agent status: Secondary | ICD-10-CM | POA: Diagnosis not present

## 2016-07-06 DIAGNOSIS — F4310 Post-traumatic stress disorder, unspecified: Secondary | ICD-10-CM | POA: Diagnosis present

## 2016-07-06 DIAGNOSIS — M4622 Osteomyelitis of vertebra, cervical region: Secondary | ICD-10-CM | POA: Diagnosis not present

## 2016-07-06 DIAGNOSIS — G894 Chronic pain syndrome: Secondary | ICD-10-CM | POA: Diagnosis not present

## 2016-07-06 DIAGNOSIS — M797 Fibromyalgia: Secondary | ICD-10-CM | POA: Diagnosis present

## 2016-07-06 DIAGNOSIS — R7303 Prediabetes: Secondary | ICD-10-CM | POA: Diagnosis present

## 2016-07-06 DIAGNOSIS — A4901 Methicillin susceptible Staphylococcus aureus infection, unspecified site: Secondary | ICD-10-CM | POA: Diagnosis present

## 2016-07-06 DIAGNOSIS — Z87891 Personal history of nicotine dependence: Secondary | ICD-10-CM | POA: Diagnosis not present

## 2016-07-06 DIAGNOSIS — T814XXA Infection following a procedure, initial encounter: Secondary | ICD-10-CM | POA: Diagnosis present

## 2016-07-06 DIAGNOSIS — Z9689 Presence of other specified functional implants: Secondary | ICD-10-CM | POA: Diagnosis not present

## 2016-07-06 DIAGNOSIS — R52 Pain, unspecified: Secondary | ICD-10-CM | POA: Diagnosis present

## 2016-07-06 DIAGNOSIS — F1120 Opioid dependence, uncomplicated: Secondary | ICD-10-CM | POA: Diagnosis not present

## 2016-07-06 DIAGNOSIS — N179 Acute kidney failure, unspecified: Secondary | ICD-10-CM | POA: Diagnosis present

## 2016-07-06 DIAGNOSIS — Z515 Encounter for palliative care: Secondary | ICD-10-CM | POA: Diagnosis not present

## 2016-07-06 DIAGNOSIS — B9561 Methicillin susceptible Staphylococcus aureus infection as the cause of diseases classified elsewhere: Secondary | ICD-10-CM | POA: Diagnosis not present

## 2016-07-06 DIAGNOSIS — Z765 Malingerer [conscious simulation]: Secondary | ICD-10-CM | POA: Diagnosis not present

## 2016-07-06 DIAGNOSIS — Z886 Allergy status to analgesic agent status: Secondary | ICD-10-CM | POA: Diagnosis not present

## 2016-07-06 DIAGNOSIS — Z811 Family history of alcohol abuse and dependence: Secondary | ICD-10-CM | POA: Diagnosis not present

## 2016-07-06 DIAGNOSIS — M069 Rheumatoid arthritis, unspecified: Secondary | ICD-10-CM | POA: Diagnosis present

## 2016-07-06 DIAGNOSIS — R443 Hallucinations, unspecified: Secondary | ICD-10-CM | POA: Diagnosis not present

## 2016-07-06 DIAGNOSIS — T8140XA Infection following a procedure, unspecified, initial encounter: Secondary | ICD-10-CM | POA: Diagnosis present

## 2016-07-06 DIAGNOSIS — Z818 Family history of other mental and behavioral disorders: Secondary | ICD-10-CM | POA: Diagnosis not present

## 2016-07-06 DIAGNOSIS — Z96611 Presence of right artificial shoulder joint: Secondary | ICD-10-CM | POA: Diagnosis present

## 2016-07-06 DIAGNOSIS — Z79899 Other long term (current) drug therapy: Secondary | ICD-10-CM | POA: Diagnosis not present

## 2016-07-06 DIAGNOSIS — Z981 Arthrodesis status: Secondary | ICD-10-CM | POA: Diagnosis not present

## 2016-07-06 DIAGNOSIS — Z884 Allergy status to anesthetic agent status: Secondary | ICD-10-CM | POA: Diagnosis not present

## 2016-07-06 DIAGNOSIS — Z7189 Other specified counseling: Secondary | ICD-10-CM | POA: Diagnosis not present

## 2016-07-06 DIAGNOSIS — Y838 Other surgical procedures as the cause of abnormal reaction of the patient, or of later complication, without mention of misadventure at the time of the procedure: Secondary | ICD-10-CM | POA: Diagnosis not present

## 2016-07-06 DIAGNOSIS — Y832 Surgical operation with anastomosis, bypass or graft as the cause of abnormal reaction of the patient, or of later complication, without mention of misadventure at the time of the procedure: Secondary | ICD-10-CM | POA: Diagnosis present

## 2016-07-06 DIAGNOSIS — F329 Major depressive disorder, single episode, unspecified: Secondary | ICD-10-CM | POA: Diagnosis present

## 2016-07-06 DIAGNOSIS — L089 Local infection of the skin and subcutaneous tissue, unspecified: Secondary | ICD-10-CM | POA: Diagnosis not present

## 2016-07-06 LAB — GLUCOSE, CAPILLARY
GLUCOSE-CAPILLARY: 102 mg/dL — AB (ref 65–99)
GLUCOSE-CAPILLARY: 126 mg/dL — AB (ref 65–99)
GLUCOSE-CAPILLARY: 85 mg/dL (ref 65–99)
Glucose-Capillary: 94 mg/dL (ref 65–99)
Glucose-Capillary: 162 mg/dL — ABNORMAL HIGH (ref 65–99)

## 2016-07-06 LAB — SURGICAL PCR SCREEN
MRSA, PCR: NEGATIVE
Staphylococcus aureus: POSITIVE — AB

## 2016-07-06 LAB — INFLUENZA PANEL BY PCR (TYPE A & B)
INFLAPCR: NEGATIVE
INFLBPCR: NEGATIVE

## 2016-07-06 LAB — RESPIRATORY PANEL BY PCR
Adenovirus: NOT DETECTED
BORDETELLA PERTUSSIS-RVPCR: NOT DETECTED
CORONAVIRUS 229E-RVPPCR: NOT DETECTED
CORONAVIRUS HKU1-RVPPCR: NOT DETECTED
Chlamydophila pneumoniae: NOT DETECTED
Coronavirus NL63: NOT DETECTED
Coronavirus OC43: NOT DETECTED
INFLUENZA B-RVPPCR: NOT DETECTED
Influenza A: NOT DETECTED
METAPNEUMOVIRUS-RVPPCR: NOT DETECTED
Mycoplasma pneumoniae: NOT DETECTED
Parainfluenza Virus 1: NOT DETECTED
Parainfluenza Virus 2: NOT DETECTED
Parainfluenza Virus 3: NOT DETECTED
Parainfluenza Virus 4: NOT DETECTED
RESPIRATORY SYNCYTIAL VIRUS-RVPPCR: NOT DETECTED
Rhinovirus / Enterovirus: NOT DETECTED

## 2016-07-06 MED ORDER — METHOCARBAMOL 500 MG PO TABS
500.0000 mg | ORAL_TABLET | Freq: Four times a day (QID) | ORAL | Status: DC | PRN
  Administered 2016-07-06 – 2016-07-10 (×7): 500 mg via ORAL
  Filled 2016-07-06 (×10): qty 1

## 2016-07-06 MED ORDER — ACETAMINOPHEN 325 MG PO TABS
650.0000 mg | ORAL_TABLET | Freq: Four times a day (QID) | ORAL | Status: DC | PRN
  Administered 2016-07-06 – 2016-07-07 (×3): 650 mg via ORAL
  Filled 2016-07-06 (×2): qty 2

## 2016-07-06 MED ORDER — VANCOMYCIN HCL IN DEXTROSE 750-5 MG/150ML-% IV SOLN
750.0000 mg | Freq: Two times a day (BID) | INTRAVENOUS | Status: DC
  Administered 2016-07-06 – 2016-07-07 (×3): 750 mg via INTRAVENOUS
  Filled 2016-07-06 (×3): qty 150

## 2016-07-06 MED ORDER — INSULIN ASPART 100 UNIT/ML ~~LOC~~ SOLN
0.0000 [IU] | SUBCUTANEOUS | Status: DC
  Administered 2016-07-06: 1 [IU] via SUBCUTANEOUS
  Administered 2016-07-06 – 2016-07-07 (×2): 2 [IU] via SUBCUTANEOUS
  Administered 2016-07-08: 1 [IU] via SUBCUTANEOUS
  Administered 2016-07-08: 2 [IU] via SUBCUTANEOUS
  Administered 2016-07-08: 1 [IU] via SUBCUTANEOUS

## 2016-07-06 MED ORDER — OXYCODONE HCL 5 MG PO TABS
10.0000 mg | ORAL_TABLET | ORAL | Status: DC | PRN
Start: 2016-07-06 — End: 2016-07-07
  Administered 2016-07-06 – 2016-07-07 (×8): 10 mg via ORAL
  Filled 2016-07-06 (×9): qty 2

## 2016-07-06 MED ORDER — ONDANSETRON HCL 4 MG PO TABS: 4.0000 mg | ORAL_TABLET | Freq: Four times a day (QID) | ORAL | Status: DC | PRN

## 2016-07-06 MED ORDER — ALPRAZOLAM 0.5 MG PO TABS
1.0000 mg | ORAL_TABLET | Freq: Three times a day (TID) | ORAL | Status: DC | PRN
  Administered 2016-07-06 – 2016-07-10 (×11): 1 mg via ORAL
  Filled 2016-07-06 (×11): qty 2

## 2016-07-06 MED ORDER — MUPIROCIN 2 % EX OINT
1.0000 "application " | TOPICAL_OINTMENT | Freq: Two times a day (BID) | CUTANEOUS | Status: DC
  Administered 2016-07-06 – 2016-07-10 (×6): 1 via NASAL
  Filled 2016-07-06 (×2): qty 22

## 2016-07-06 MED ORDER — HYDROMORPHONE HCL 1 MG/ML IJ SOLN
1.0000 mg | INTRAMUSCULAR | Status: DC | PRN
  Administered 2016-07-06 – 2016-07-07 (×7): 1 mg via INTRAVENOUS
  Filled 2016-07-06 (×6): qty 1

## 2016-07-06 MED ORDER — SODIUM CHLORIDE 0.9 % IV SOLN
INTRAVENOUS | Status: AC
  Administered 2016-07-06: 01:00:00 via INTRAVENOUS

## 2016-07-06 MED ORDER — HYDROMORPHONE HCL 1 MG/ML IJ SOLN
1.0000 mg | INTRAMUSCULAR | Status: DC | PRN
  Administered 2016-07-06 (×2): 1 mg via INTRAVENOUS
  Filled 2016-07-06 (×2): qty 1

## 2016-07-06 MED ORDER — ZOLPIDEM TARTRATE 5 MG PO TABS
5.0000 mg | ORAL_TABLET | Freq: Every evening | ORAL | Status: DC | PRN
  Administered 2016-07-06 – 2016-07-08 (×2): 5 mg via ORAL
  Filled 2016-07-06 (×2): qty 1

## 2016-07-06 MED ORDER — PIPERACILLIN-TAZOBACTAM 3.375 G IVPB
3.3750 g | Freq: Three times a day (TID) | INTRAVENOUS | Status: DC
  Administered 2016-07-06 – 2016-07-09 (×10): 3.375 g via INTRAVENOUS
  Filled 2016-07-06 (×13): qty 50

## 2016-07-06 MED ORDER — CHLORHEXIDINE GLUCONATE 4 % EX LIQD: 60.0000 mL | Freq: Once | CUTANEOUS | Status: DC

## 2016-07-06 MED ORDER — VENLAFAXINE HCL ER 75 MG PO CP24
150.0000 mg | ORAL_CAPSULE | Freq: Every day | ORAL | Status: DC
  Administered 2016-07-06 – 2016-07-10 (×4): 150 mg via ORAL
  Filled 2016-07-06 (×5): qty 2

## 2016-07-06 MED ORDER — ACETAMINOPHEN 650 MG RE SUPP: 650.0000 mg | Freq: Four times a day (QID) | RECTAL | Status: DC | PRN

## 2016-07-06 MED ORDER — MORPHINE SULFATE (PF) 2 MG/ML IV SOLN
2.0000 mg | INTRAVENOUS | Status: DC | PRN
  Administered 2016-07-06: 2 mg via INTRAVENOUS
  Filled 2016-07-06: qty 1

## 2016-07-06 MED ORDER — FLUOXETINE HCL 10 MG PO CAPS
10.0000 mg | ORAL_CAPSULE | Freq: Every day | ORAL | Status: DC
  Administered 2016-07-06 – 2016-07-09 (×4): 10 mg via ORAL
  Filled 2016-07-06 (×4): qty 1

## 2016-07-06 MED ORDER — CHLORHEXIDINE GLUCONATE CLOTH 2 % EX PADS
6.0000 | MEDICATED_PAD | Freq: Every day | CUTANEOUS | Status: DC
  Administered 2016-07-07 – 2016-07-09 (×3): 6 via TOPICAL

## 2016-07-06 MED ORDER — ONDANSETRON HCL 4 MG/2ML IJ SOLN
4.0000 mg | Freq: Four times a day (QID) | INTRAMUSCULAR | Status: DC | PRN
  Filled 2016-07-06: qty 2

## 2016-07-06 NOTE — ED Notes (Signed)
Pt transported by this emt to 6N-26. During transport pt was completely cooperative and in no apparent distress. Pts daughter accompanied pt to 6N-26. Upon arrival to pts room pt began to get agitated with staff and kicking and slamming her fist in the bed while cussing. Pt stated she would slide in the hospital bed from the stretcher herself. Pt rolled and moved quickly and with ease to hospital bed with no assistance. Pt was actively yelling at the top of her lungs at all staff in the room about her frustrations and how she wanted pain meds. Pts daughter unable to console her while this tech was in the room, the daughter and the pt screamed back at each other. Th emt turned over care to the [facility] RN & [email or web address]

## 2016-07-06 NOTE — Progress Notes (Signed)
Called [facility] and spoke to Dr. Lajoyce Corners informing him that patient is wanting to see Dr. Annell Greening and demanding for stronger pain medication.  Dr. Lajoyce Corners will rely the message to Dr. Pleas Koch thru text messaging.

## 2016-07-06 NOTE — Progress Notes (Addendum)
Patient woke up shouting to this RN demanding stronger pain medication for her neck pain and refused to take Oxy IR.  This RN explained that the Oxy IR earlier made her slept but still insisted on stronger pain medication and immediately got up to dress up and pulled out her IV.  Patient does not want antibiotic either, refused bandage on IV site, VS, CBG check & new IV site at this time, and just want stronger pain medication. Patient went back to bed covering her face with blanket after calling her daughter.  Will monitor behavior.  Paged floor coverage TRH NP for awareness.

## 2016-07-06 NOTE — Anesthesia Preprocedure Evaluation (Addendum)
Anesthesia Evaluation  Patient identified by MRN, date of birth, ID band Patient awake    Reviewed: Allergy & Precautions, NPO status , Patient's Chart, lab work & pertinent test results  Airway Mallampati: III  TM Distance: >3 FB Neck ROM: Limited    Dental no notable dental hx. (+) Dental Advisory Given, Poor Dentition,    Pulmonary asthma , former smoker,    Pulmonary exam normal breath sounds clear to auscultation       Cardiovascular negative cardio ROS Normal cardiovascular exam Rhythm:Regular Rate:Normal     Neuro/Psych PSYCHIATRIC DISORDERS Anxiety Depression ADDChronic pain syndrome  Neuromuscular disease    GI/Hepatic negative GI ROS, (+)     substance abuse  , Hx/o narcotic dependence   Endo/Other  negative endocrine ROS  Renal/GU Renal diseaseCKD Hx/o renal calculi Bladder dysfunction  Urinary incontinence    Musculoskeletal  (+) Arthritis , Rheumatoid disorders,  Fibromyalgia -Infected posterior neck S/P Surgery Osteoporosis   Abdominal   Peds negative pediatric ROS (+)  Hematology negative hematology ROS (+)   Anesthesia Other Findings   Reproductive/Obstetrics negative OB ROS                           11/2015 EKG: normal sinus rhythm.  11/2015 Echo - Left ventricle: The cavity size was normal. Wall thickness was   normal. Systolic function was normal. The estimated ejection   fraction was in the range of 60% to 65%. Wall motion was normal;   there were no regional wall motion abnormalities. Doppler   parameters are consistent with abnormal left ventricular   relaxation (grade 1 diastolic dysfunction).  Anesthesia Physical  Anesthesia Plan  ASA: II  Anesthesia Plan: General   Post-op Pain Management:    Induction: Intravenous  Airway Management Planned: Oral ETT and Video Laryngoscope Planned  Additional Equipment:   Intra-op Plan:   Post-operative  Plan: Extubation in OR  Informed Consent: I have reviewed the patients History and Physical, chart, labs and discussed the procedure including the risks, benefits and alternatives for the proposed anesthesia with the patient or authorized representative who has indicated his/her understanding and acceptance.   Dental advisory given  Plan Discussed with: Anesthesiologist, CRNA and Surgeon  Anesthesia Plan Comments: (No Ketamine)       Anesthesia Quick Evaluation

## 2016-07-06 NOTE — Progress Notes (Signed)
Orthopedic Tech Progress Note Patient Details:  Kara [name] May 04, 1955 [id number]  Ortho Devices Type of Ortho Device: Soft collar Ortho Device/Splint Location: left with rn Ortho Device/Splint Interventions: Rich Brave 07/06/2016, 11:01 AM

## 2016-07-06 NOTE — Progress Notes (Signed)
Pharmacy Antibiotic Note  Kara [name] is a 61 y.o. female admitted on 07/05/2016 with post-op wound infection.  Pharmacy has been consulted for Vancocin and Zosyn dosing.  Plan: Vancomycin 1000mg  in ED then 750mg  IV every 12 hours.  Goal trough 10-15 mcg/mL. Zosyn 3.375g IV q8h (4 hour infusion).  Height: 5\' 4"  (162.6 cm) Weight: 155 lb (70.3 kg) IBW/kg (Calculated) : 54.7  Temp (24hrs), Avg:100.1 F (37.8 C), Min:99.5 F (37.5 C), Max:101.2 F (38.4 C)   Recent Labs Lab 07/05/16 1817 07/05/16 1949 07/05/16 2219  WBC 20.0*  --   --   CREATININE 0.84  --   --   LATICACIDVEN  --  0.73 0.96    Estimated Creatinine Clearance: 69.3 mL/min (by C-G formula based on SCr of 0.84 mg/dL).    Allergies  Allergen Reactions  . Aspirin Anaphylaxis  . Ibuprofen Anaphylaxis  . Ativan [Lorazepam] Other (See Comments)    Makes agitated, combative   . Ketamine Other (See Comments)    Hallucinations  . Toradol [Ketorolac Tromethamine] Itching, Swelling and Rash    UNSPECIFIED REACTION   . Tramadol Hcl Itching, Swelling and Rash    SWELLING REACTION UNSPECIFIED      Thank you for allowing pharmacy to be a part of this patient's care.  09/04/16, PharmD, BCPS  07/06/2016 3:56 AM

## 2016-07-06 NOTE — Progress Notes (Addendum)
Received patient from ED accompanied by daughter.  Patient AOx4, VS stable, O2Sat at 98% on RA and shouting demanding for pain medication.  This RN explained to patient and daughter that PRN pain medications Oxy IR and Tylenol will be given including PRN Xanax to calm patient down. Patient now resting on bed covering her body and face with blanket trying to get some sleep.  Will continue to monitor.  Patient refuses EKG and blood draw at this time and wanted it around 5-6am. CNA and Phlebotomist made aware, respectively.

## 2016-07-06 NOTE — Progress Notes (Signed)
Patient ID: Kara [name], female   DOB: Jul 08, 1955, 61 y.o.   MRN: [id number]  PROGRESS NOTE    Kara [name]  PYP:[id number] DOB: December 14, 1955 DOA: 07/05/2016 PCP: Felix Pacini, DO   Brief Narrative:  61 y.o. female with medical history significant of neck pain, fibromyalgia, history of opioid dependence, rheumatoid arthritis who recently had cervical fusion of C5-C7 spinous process wiring done on 28 th of March 2018 presented with worsening pain, fever and drainage from the wound site. She was admitted with probable wound infection, SIRS and started on iv antibiotics; orthopedics has been consulted.   Assessment & Plan:   Active Problems:   Fibromyalgia   S/P cervical spinal fusion   Chronic pain   Prediabetes   SIRS (systemic inflammatory response syndrome) (HCC)   Wound infection   Pyuria   Post-operative infection  1. Wound infection: of the recent cervical fusion wound -continue iv Vanc and Zosyn. Vanc dosing as per pharmacy. Follow up cultures. -Orthopedics consult appreciated. She will be NPO past midnight for possible ?debridement. -pain management: will be careful with opiates as patient has history of narcotic dependence. Monitor mental status and hemodynamics.  -follow AM labs  2. Leukocytosis: probably due to #1; follow AM labs  3. SIRS: secondary to wound infection will continue broad-spectrum antibiotics await results of blood cultures  4.  Chronic pain - difficult to manage history of opioid dependence and drug-seeking behavior -cautious use of iv dilaudid and oxycodone orally  5.  Fibromyalgia:continue home medications  6.  Prediabetes: monitor blood sugars;  cover with insulin sliding scale  7.  Pyuria:  await results of urine culture; continue antibiotics for now   DVT prophylaxis: SCDs; avoid Lovenox as patient might need surgery Code Status: FULL CODE  Family Communication: none present Disposition Plan: Home care vs SNF in 2-3 days  Consultants:    Orthopedics: Dr. Ophelia Charter  Procedures:  None  Antimicrobials: Vanc & Zosyn 07/05/16  Subjective: Patient is drowsy and sleepy. She was in extreme pain as per the nurse. No overnight vomiting.  Objective: Vitals:   07/05/16 2300 07/05/16 2330 07/05/16 2345 07/06/16 0033  BP: (!) 104/55 99/61  126/70  Pulse:   (!) 112 87  Resp:    17  Temp:    99.5 F (37.5 C)  TempSrc:    Oral  SpO2:   100% 98%  Weight:    70.3 kg (155 lb)  Height:    5\' 4"  (1.626 m)    Intake/Output Summary (Last 24 hours) at 07/06/16 1134 Last data filed at 07/06/16 0908  Gross per 24 hour  Intake             1355 ml  Output                0 ml  Net             1355 ml   Filed Weights   07/05/16 1652 07/06/16 0033  Weight: 72.1 kg (159 lb) 70.3 kg (155 lb)    Examination:  General exam: Appears calm and comfortable.  Respiratory system: Clear to auscultation. Respiratory effort normal. Cardiovascular system: S1 & S2 heard, RRR. Gastrointestinal system: Abdomen is nondistended, soft and nontender. No organomegaly or masses felt. Normal bowel sounds heard. Central nervous system: Sleepy; wakes up on calling her name; answers few questions; moving extremities Extremities: no cyanosis, clubbing, edema     Data Reviewed: I have personally reviewed following labs and imaging studies  CBC:  Recent  Labs Lab 07/05/16 1817  WBC 20.0*  NEUTROABS 17.3*  HGB 12.4  HCT 38.0  MCV 87.0  PLT 341   Basic Metabolic Panel:  Recent Labs Lab 07/05/16 1817  NA 134*  K 4.3  CL 102  CO2 22  GLUCOSE 116*  BUN 23*  CREATININE 0.84  CALCIUM 9.1   GFR: Estimated Creatinine Clearance: 69.3 mL/min (by C-G formula based on SCr of 0.84 mg/dL). Liver Function Tests:  Recent Labs Lab 07/05/16 1817  AST 29  ALT 39  ALKPHOS 115  BILITOT 0.5  PROT 6.6  ALBUMIN 3.6   No results for input(s): LIPASE, AMYLASE in the last 168 hours. No results for input(s): AMMONIA in the last 168 hours. Coagulation  Profile: No results for input(s): INR, PROTIME in the last 168 hours. Cardiac Enzymes: No results for input(s): CKTOTAL, CKMB, CKMBINDEX, TROPONINI in the last 168 hours. BNP (last 3 results) No results for input(s): PROBNP in the last 8760 hours. HbA1C: No results for input(s): HGBA1C in the last 72 hours. CBG:  Recent Labs Lab 07/06/16 0043 07/06/16 0836  GLUCAP 85 102*   Lipid Profile: No results for input(s): CHOL, HDL, LDLCALC, TRIG, CHOLHDL, LDLDIRECT in the last 72 hours. Thyroid Function Tests: No results for input(s): TSH, T4TOTAL, FREET4, T3FREE, THYROIDAB in the last 72 hours. Anemia Panel: No results for input(s): VITAMINB12, FOLATE, FERRITIN, TIBC, IRON, RETICCTPCT in the last 72 hours. Sepsis Labs:  Recent Labs Lab 07/05/16 1949 07/05/16 2219  LATICACIDVEN 0.73 0.96    Recent Results (from the past 240 hour(s))  Surgical pcr screen     Status: Abnormal   Collection Time: 07/06/16  8:24 AM  Result Value Ref Range Status   MRSA, PCR NEGATIVE NEGATIVE Final   Staphylococcus aureus POSITIVE (A) NEGATIVE Final    Comment:        The Xpert SA Assay (FDA approved for NASAL specimens in patients over 36 years of age), is one component of a comprehensive surveillance program.  Test performance has been validated by [facility] for patients greater than or equal to 53 year old. It is not intended to diagnose infection nor to guide or monitor treatment.          Radiology Studies: Dg Chest 2 View  Result Date: 07/05/2016 CLINICAL DATA:  Postop fever for 3 days. Status post cervical spine fusion. EXAM: CHEST  2 VIEW COMPARISON:  12/13/2015 FINDINGS: Low lung volumes noted with mild bibasilar atelectasis. No evidence of pulmonary consolidation or pleural effusion. Heart size and mediastinal contours are within normal limits. Right shoulder prosthesis and cervical spine fusion hardware again noted. IMPRESSION: Low lung volumes with mild bibasilar  atelectasis. Electronically Signed   By: Myles Rosenthal M.D.   On: 07/05/2016 18:49        Scheduled Meds: . chlorhexidine  60 mL Topical Once  . FLUoxetine  10 mg Oral Daily  . insulin aspart  0-9 Units Subcutaneous Q4H  . piperacillin-tazobactam (ZOSYN)  IV  3.375 g Intravenous Q8H  . vancomycin  750 mg Intravenous Q12H  . venlafaxine XR  150 mg Oral Q breakfast      Glade Lloyd, MD [facility] Pager [phone]  If 7PM-7AM, please contact night-coverage [email or web address] Password [facility] 07/06/2016, 11:34 AM

## 2016-07-06 NOTE — Progress Notes (Addendum)
Patient went out of her room shouting, lying on the floor. Got up and threw her cellphone and just want stronger pain medication, wants to go home.  Called security for assistance.  Patient stated, "I will go home when I see Dr. Pleas Koch - Ortho Surgeon".  Security told patient to stop shouting so as not to disturb other patients. Gave patient coke to drink as per her request.  Another RN trying to talk to patient but to no avail.  Patient back in her room and still shouting intermittently. Security staff left and will be called back if needed.

## 2016-07-06 NOTE — Progress Notes (Signed)
Patient still on bed covering her face with blanket and does not want to be disturbed.  K. Schorr just called back this RN and will have to discharge the patient AMA if she wants to.  Apparently, patient waiting for daughter to arrive [facility] for the ride.

## 2016-07-06 NOTE — ED Notes (Signed)
Pt's daughter has come up to nurses station demanding more pain Mx for her mother. It was explained that the pt had received a dose of dilaudid.  Pt had been sleeping until her daughter woke her up.

## 2016-07-06 NOTE — Evaluation (Signed)
Physical Therapy Evaluation Patient Details Name: Kara [name] MRN: [id number] DOB: [birth year] Today's Date: 07/06/2016   History of Present Illness  Pt is 61 y/o female presenting with surgical infection from C5-C7 fusion on 06/21/16. PMH includes fibromyalgia, RA, CKD, opiod dependence, and has had previous R shoulder and back surgeries.   Clinical Impression  Pt presenting with problem above and deficits below. PTA, pt reports she was independent with all functional mobility. Upon evaluation, very limited by pain, even with pain medication administration, and only agreeable to mobility to bathroom. Pt requiring min a for gait without AD secondary to decreased balance, and increased independence with use of RW, however, required verbal cues to use properly. Pt reporting she is going home at d/c. Pt reports husband is available to assist 24 hrs. RN reporting pt is scheduled for OR tomorrow. Will need to follow up to progress mobility as pain improves. Recommending HHPT and equipment below to increase functional mobility independence. Will continue to follow acutely.     Follow Up Recommendations Home health PT;Supervision/Assistance - 24 hour    Equipment Recommendations  Rolling walker with 5" wheels;3in1 (PT)    Recommendations for Other Services       Precautions / Restrictions Precautions Precautions: Cervical Required Braces or Orthoses: Cervical Brace Cervical Brace: Soft collar Restrictions Weight Bearing Restrictions: No      Mobility  Bed Mobility Overal bed mobility: Needs Assistance Bed Mobility: Sidelying to Sit;Rolling Rolling: Supervision Sidelying to sit: Supervision       General bed mobility comments: Verbal cues for log roll, however, pt very impulsive with movement. Safety cues to wait for PT, however, pt stating "I have to go to the bathroom."  Transfers Overall transfer level: Needs assistance Equipment used: None Transfers: Sit to/from Stand Sit to  Stand: Min guard         General transfer comment: Min guard for safety. Verbal cues to wait for PT for mobility.   Ambulation/Gait Ambulation/Gait assistance: Min assist Ambulation Distance (Feet): 25 Feet Assistive device: Rolling walker (2 wheeled);None Gait Pattern/deviations: Step-through pattern;Decreased stride length;Staggering right;Staggering left Gait velocity: Decreased Gait velocity interpretation: Below normal speed for age/gender General Gait Details: Only agreeable to mobility to the bathroom. Slow, unsteady gait. Pt stating she did not need AD to walk, however, without AD pt stumbling to L and R and required min A to prevent LOB. Gave pt RW and pt increased steadiness, however, when turning lifted RW off the ground. Verbal cues for appropriate sequencing. Educated about need for RW at home, however, pt too focused on pain this session.   Stairs            Wheelchair Mobility    Modified Rankin (Stroke Patients Only)       Balance Overall balance assessment: Needs assistance Sitting-balance support: No upper extremity supported;Feet supported Sitting balance-Leahy Scale: Fair     Standing balance support: No upper extremity supported;Bilateral upper extremity supported;During functional activity Standing balance-Leahy Scale: Poor Standing balance comment: Reliant on RW for steadying. Attempted without AD, however, pt requiring min A for balance.                               Pertinent Vitals/Pain Pain Assessment: 0-10 Pain Score: 10-Worst pain ever Pain Location: hands/feet/neck/all over Pain Descriptors / Indicators: Grimacing;Constant;Throbbing Pain Intervention(s): Limited activity within patient's tolerance;Monitored during session;Repositioned;RN gave pain meds during session;Patient requesting pain meds-RN notified    Home  Living Family/patient expects to be discharged to:: Private residence Living Arrangements: Spouse/significant  other Available Help at Discharge: Family;Available 24 hours/day Type of Home: House Home Access: Level entry     Home Layout: One level Home Equipment: None      Prior Function Level of Independence: Independent               Hand Dominance   Dominant Hand: Right    Extremity/Trunk Assessment   Upper Extremity Assessment Upper Extremity Assessment: RUE deficits/detail;LUE deficits/detail RUE Deficits / Details: Reports numbness at baseline LUE Deficits / Details: Reports numbness at baseline    Lower Extremity Assessment Lower Extremity Assessment: Generalized weakness    Cervical / Trunk Assessment Cervical / Trunk Assessment: Other exceptions Cervical / Trunk Exceptions: s/p sugery on 3/28 and now presenting with surgical infection  Communication   Communication: No difficulties  Cognition Arousal/Alertness: Awake/alert Behavior During Therapy: WFL for tasks assessed/performed Overall Cognitive Status: Within Functional Limits for tasks assessed                                        General Comments General comments (skin integrity, edema, etc.): Pt very focused on pain this session, stating "nothing is working." Charity fundraiser administered pain meds during session, and reports pt has had a lot of pain meds throughout the day. Pt complaining mostly about fibromyalgia and educated about importance of mobility in decreasing pain.     Exercises     Assessment/Plan    PT Assessment Patient needs continued PT services  PT Problem List Decreased strength;Decreased activity tolerance;Decreased balance;Decreased mobility;Decreased knowledge of use of DME;Decreased safety awareness;Decreased knowledge of precautions;Pain       PT Treatment Interventions DME instruction;Gait training;Functional mobility training;Therapeutic activities;Therapeutic exercise;Balance training;Neuromuscular re-education;Patient/family education    PT Goals (Current goals can be  found in the Care Plan section)  Acute Rehab PT Goals Patient Stated Goal: to decrease pain  PT Goal Formulation: With patient Time For Goal Achievement: 07/20/16 Potential to Achieve Goals: Fair    Frequency Min 3X/week   Barriers to discharge        Co-evaluation               End of Session Equipment Utilized During Treatment: Gait belt;Cervical collar Activity Tolerance: Patient limited by pain Patient left: in bed;with call bell/phone within reach Nurse Communication: Mobility status PT Visit Diagnosis: Unsteadiness on feet (R26.81);Pain Pain - part of body: Hand;Ankle and joints of foot (back )    Time: 7948-0165 PT Time Calculation (min) (ACUTE ONLY): 18 min   Charges:   PT Evaluation $PT Eval Low Complexity: 1 Procedure     PT G Codes:   PT G-Codes **NOT FOR INPATIENT CLASS** Functional Assessment Tool Used: AM-PAC 6 Clicks Basic Mobility;Clinical judgement Functional Limitation: Mobility: Walking and moving around Mobility: Walking and Moving Around Current Status (V3748): At least 40 percent but less than 60 percent impaired, limited or restricted Mobility: Walking and Moving Around Goal Status [phone]): At least 1 percent but less than 20 percent impaired, limited or restricted    Margot Chimes, PT, DPT  Acute Rehabilitation Services  Pager: [phone]   Melvyn Novas 07/06/2016, 5:27 PM

## 2016-07-06 NOTE — ED Notes (Signed)
Pt has been placed in a room upon request of the MD.  Pt's daughter keeps continually asking to talk the nurse and doctor about getting more pain Mx for pt, even though it has explained she cannot.  Pt only c/o of not being able to sleep because the lights are to bright.  Lights have been turned low and pt is resting.

## 2016-07-06 NOTE — Consult Note (Signed)
Reason for Consult:uncotrolled pain post cervical fusion Referring Physician: Starla Link MD  Kara [name] is an 61 y.o. female.  HPI:  Patient post op cervical fusion for pseudarthrosis with weaning of pain meds in patient with past Hx of narcotic pain management per husband her incision looked good yesterday then today erythema and draining at inferior end of incision   Past Medical History:  Diagnosis Date  . ADD (attention deficit disorder)    on Adderal  . Aggressive behavior of adult   . Allergy   . Anxiety   . Arthritis   . Asthma    as a child  . Cellulitis of breast 11/2013   RIGHT BREAST  . Chronic fatigue and immune dysfunction syndrome ([place])   . Chronic kidney disease    due to infection from stone- no regular visits- fine now  . Chronic pain    goes to [facility] for pain control  . Cold sore   . 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 Depression    takes meds daily  . Family history of malignant neoplasm of breast   . Fibromyalgia   . Nephrolithiasis   . Osteoporosis   . Other specified rheumatoid arthritis, right shoulder ([place]) 08/01/2011  . Post-nasal drip    hx of  . Rheumatoid arthritis(714.0)    autoimmune arthritis; methotrexate once/week  . Urinary incontinence     Past Surgical History:  Procedure Laterality Date  . ANTERIOR CERVICAL DECOMP/DISCECTOMY FUSION N/A 03/15/2015   Procedure: Cervical five-six, Cerival six-seven, Anterior Cervical Discectomy and Fusion, Allograft and Plate;  Surgeon: Marybelle Killings, MD;  Location: [facility];  Service: Orthopedics;  Laterality: N/A;  . BACK SURGERY  2011,16   lower back fusion l4-5, s1  . BLADDER SURGERY  2009  . BREAST ENHANCEMENT SURGERY  2003  . breast implants removed    . BREAST SURGERY  10/2013   REMOVAL OF BREAST IMPLANTS  . INCISION AND DRAINAGE ABSCESS Right 01/16/2014   Procedure: INCISION AND DRAINAGE AND OF RIGHT BREAST ABCESS;  Surgeon: Excell Seltzer, MD;  Location: [facility];  Service: General;  Laterality: Right;  . JOINT REPLACEMENT    . LIPOSUCTION  2003   abdominal  . LUMBAR LAMINECTOMY/DECOMPRESSION MICRODISCECTOMY N/A 12/10/2015   Procedure: Right L3-4 Hemilaminectomy, Excision of herniated nucleus pulposus;  Surgeon: Marybelle Killings, MD;  Location: [facility];  Service: Orthopedics;  Laterality: N/A;  . MASS EXCISION  11/03/2011   Procedure: MINOR EXCISION OF MASS;  Surgeon: Cammie Sickle., MD;  Location: [facility];  Service: Orthopedics;  Laterality: Left;  debride IP joint, cyst excision left index  . POSTERIOR CERVICAL FUSION/FORAMINOTOMY N/A 06/21/2016   Procedure: POSTERIOR CERVICAL FUSION C5-C7 SPINOUS PROCESS WIRING;  Surgeon: Marybelle Killings, MD;  Location: [facility] [facility];  Service: Orthopedics;  Laterality: N/A;  . POSTERIOR FUSION CERVICAL SPINE  06/21/2016   C5 C7  . SHOULDER SURGERY  2012   right  . TONSILLECTOMY  1987  . TOTAL SHOULDER ARTHROPLASTY  08/01/2011   Procedure: TOTAL SHOULDER ARTHROPLASTY;  Surgeon: Johnny Bridge, MD;  Location: [facility];  Service: Orthopedics;  Laterality: Right;  Right total shoulder arthroplasty  . TUBAL LIGATION  1988    Family History  Problem Relation Age of Onset  . Arthritis Mother   . Heart disease Mother     ?psvt  . Breast cancer Mother 24    TAH/BSO  .  Cancer Mother   . Mental illness Mother   . COPD Father   . Hypertension Father   . Alcohol abuse Father   . Mental illness Father   . Heart disease Father   . Healthy Daughter   . Breast cancer Maternal Aunt 27    deceased  . Cancer Cousin 83    female; unknown primary  . Colon cancer Paternal Aunt 10    deceased at 54  . Stomach cancer Paternal Uncle 64    deceased at 67  . Alcohol abuse Brother   . Cancer Brother   . Hodgkin's lymphoma Brother   . Mental illness Brother   . HIV Brother   . Healthy Brother   . Healthy Son     Social History:  reports that she quit smoking about 10 years ago. Her  smoking use included Cigarettes. She has a 5.00 pack-year smoking history. She has never used smokeless tobacco. She reports that she does not drink alcohol or use drugs.  Allergies:  Allergies  Allergen Reactions  . Aspirin Anaphylaxis  . Ibuprofen Anaphylaxis  . Ativan [Lorazepam] Other (See Comments)    Makes agitated, combative   . Ketamine Other (See Comments)    Hallucinations  . Toradol [Ketorolac Tromethamine] Itching, Swelling and Rash    UNSPECIFIED REACTION   . Tramadol Hcl Itching, Swelling and Rash    SWELLING REACTION UNSPECIFIED     Medications: I have reviewed the patient's current medications.  Results for orders placed or performed during the hospital encounter of 07/05/16 (from the past 48 hour(s))  CBC with Differential     Status: Abnormal   Collection Time: 07/05/16  6:17 PM  Result Value Ref Range   WBC 20.0 (H) 4.0 - 10.5 K/uL   RBC 4.37 3.87 - 5.11 MIL/uL   Hemoglobin 12.4 12.0 - 15.0 g/dL   HCT 38.0 36.0 - 46.0 %   MCV 87.0 78.0 - 100.0 fL   MCH 28.4 26.0 - 34.0 pg   MCHC 32.6 30.0 - 36.0 g/dL   RDW 13.3 11.5 - 15.5 %   Platelets 341 150 - 400 K/uL   Neutrophils Relative % 87 %   Neutro Abs 17.3 (H) 1.7 - 7.7 K/uL   Lymphocytes Relative 6 %   Lymphs Abs 1.3 0.7 - 4.0 K/uL   Monocytes Relative 7 %   Monocytes Absolute 1.3 (H) 0.1 - 1.0 K/uL   Eosinophils Relative 0 %   Eosinophils Absolute 0.1 0.0 - 0.7 K/uL   Basophils Relative 0 %   Basophils Absolute 0.0 0.0 - 0.1 K/uL  Comprehensive metabolic panel     Status: Abnormal   Collection Time: 07/05/16  6:17 PM  Result Value Ref Range   Sodium 134 (L) 135 - 145 mmol/L   Potassium 4.3 3.5 - 5.1 mmol/L   Chloride 102 101 - 111 mmol/L   CO2 22 22 - 32 mmol/L   Glucose, Bld 116 (H) 65 - 99 mg/dL   BUN 23 (H) 6 - 20 mg/dL   Creatinine, Ser 0.84 0.44 - 1.00 mg/dL   Calcium 9.1 8.9 - 10.3 mg/dL   Total Protein 6.6 6.5 - 8.1 g/dL   Albumin 3.6 3.5 - 5.0 g/dL   AST 29 15 - 41 U/L   ALT 39 14 - 54  U/L   Alkaline Phosphatase 115 38 - 126 U/L   Total Bilirubin 0.5 0.3 - 1.2 mg/dL   GFR calc non Af Amer >60 >60 mL/min  GFR calc Af Amer >60 >60 mL/min    Comment: (NOTE) The eGFR has been calculated using the CKD EPI equation. This calculation has not been validated in all clinical situations. eGFR's persistently <60 mL/min signify possible Chronic Kidney Disease.    Anion gap 10 5 - 15  Sedimentation rate     Status: Abnormal   Collection Time: 07/05/16  6:17 PM  Result Value Ref Range   Sed Rate 31 (H) 0 - 22 mm/hr  C-reactive protein     Status: Abnormal   Collection Time: 07/05/16  6:17 PM  Result Value Ref Range   CRP 15.4 (H) <1.0 mg/dL  I-Stat CG4 Lactic Acid, ED     Status: None   Collection Time: 07/05/16  7:49 PM  Result Value Ref Range   Lactic Acid, Venous 0.73 0.5 - 1.9 mmol/L  Urinalysis, Routine w reflex microscopic     Status: Abnormal   Collection Time: 07/05/16  9:26 PM  Result Value Ref Range   Color, Urine STRAW (A) YELLOW   APPearance CLEAR CLEAR   Specific Gravity, Urine 1.010 1.005 - 1.030   pH 5.0 5.0 - 8.0   Glucose, UA NEGATIVE NEGATIVE mg/dL   Hgb urine dipstick SMALL (A) NEGATIVE   Bilirubin Urine NEGATIVE NEGATIVE   Ketones, ur NEGATIVE NEGATIVE mg/dL   Protein, ur NEGATIVE NEGATIVE mg/dL   Nitrite NEGATIVE NEGATIVE   Leukocytes, UA LARGE (A) NEGATIVE   RBC / HPF 0-5 0 - 5 RBC/hpf   WBC, UA TOO NUMEROUS TO COUNT 0 - 5 WBC/hpf   Bacteria, UA RARE (A) NONE SEEN   Squamous Epithelial / LPF 0-5 (A) NONE SEEN   Mucous PRESENT   I-Stat CG4 Lactic Acid, ED     Status: None   Collection Time: 07/05/16 10:19 PM  Result Value Ref Range   Lactic Acid, Venous 0.96 0.5 - 1.9 mmol/L  Glucose, capillary     Status: None   Collection Time: 07/06/16 12:43 AM  Result Value Ref Range   Glucose-Capillary 85 65 - 99 mg/dL  Influenza panel by PCR (type A & B)     Status: None   Collection Time: 07/06/16 12:55 AM  Result Value Ref Range   Influenza A By  PCR NEGATIVE NEGATIVE   Influenza B By PCR NEGATIVE NEGATIVE    Comment: (NOTE) The Xpert Xpress Flu assay is intended as an aid in the diagnosis of  influenza and should not be used as a sole basis for treatment.  This  assay is FDA approved for nasopharyngeal swab specimens only. Nasal  washings and aspirates are unacceptable for Xpert Xpress Flu testing.     Dg Chest 2 View  Result Date: 07/05/2016 CLINICAL DATA:  Postop fever for 3 days. Status post cervical spine fusion. EXAM: CHEST  2 VIEW COMPARISON:  12/13/2015 FINDINGS: Low lung volumes noted with mild bibasilar atelectasis. No evidence of pulmonary consolidation or pleural effusion. Heart size and mediastinal contours are within normal limits. Right shoulder prosthesis and cervical spine fusion hardware again noted. IMPRESSION: Low lung volumes with mild bibasilar atelectasis. Electronically Signed   By: Earle Gell M.D.   On: 07/05/2016 18:49    ROS 14 point updated, fever to 99 yesterday. Trace serous drainage , husband changing dressing twice a day. Was not red and no purulent drainage.  Past Psych Hx sees Dr. Toy Care.  Fibromyalgia, cervical DDD.  Blood pressure 126/70, pulse 87, temperature 99.5 F (37.5 C), temperature source Oral, resp. rate  17, height 5' 4" (1.626 m), weight 155 lb (70.3 kg), SpO2 98 %. Physical Exam  Constitutional: She appears well-developed and well-nourished.  Patient yelling, screaming at nurses. Rapid movement in bed.   Eyes: Conjunctivae are normal. Pupils are equal, round, and reactive to light.  Neck: Normal range of motion.  Posterior inferior neck incision inferior  Has erythema and slight purulent drainage with squeezing. Culture obtained.   Cardiovascular: Regular rhythm.   Respiratory: Effort normal and breath sounds normal.  GI: Soft. Bowel sounds are normal.  Skin: No rash noted. There is erythema. No pallor.  Psychiatric:  Manic, yelling she is in pain and no one believes her.      Assessment/Plan: Post op cervical posterior fusion with early post op superficial infection in patient who handles pain poorly, past Hx of narcotic pain management. Have posted for washout of superficial infection . Culture done today. PCR ordered. Recently neg, 7 months ago positive for MSSA.  Vanc and  Zosyn.  NPO after MN. Surgery in AM, dilaudid ordered.   Marybelle Killings 07/06/2016, 7:57 AM

## 2016-07-07 ENCOUNTER — Inpatient Hospital Stay (HOSPITAL_COMMUNITY): Payer: Managed Care, Other (non HMO) | Admitting: Certified Registered"

## 2016-07-07 ENCOUNTER — Telehealth (INDEPENDENT_AMBULATORY_CARE_PROVIDER_SITE_OTHER): Payer: Self-pay | Admitting: Orthopaedic Surgery

## 2016-07-07 ENCOUNTER — Inpatient Hospital Stay (INDEPENDENT_AMBULATORY_CARE_PROVIDER_SITE_OTHER): Payer: Managed Care, Other (non HMO) | Admitting: Orthopaedic Surgery

## 2016-07-07 ENCOUNTER — Encounter (HOSPITAL_COMMUNITY): Payer: Self-pay | Admitting: Certified Registered Nurse Anesthetist

## 2016-07-07 ENCOUNTER — Encounter (HOSPITAL_COMMUNITY)
Admission: EM | Disposition: A | Discharge status: Home Health | Payer: Self-pay | Source: Home / Self Care | Attending: Internal Medicine

## 2016-07-07 DIAGNOSIS — T814XXA Infection following a procedure, initial encounter: Principal | ICD-10-CM

## 2016-07-07 DIAGNOSIS — T148XXA Other injury of unspecified body region, initial encounter: Secondary | ICD-10-CM

## 2016-07-07 DIAGNOSIS — L089 Local infection of the skin and subcutaneous tissue, unspecified: Secondary | ICD-10-CM

## 2016-07-07 HISTORY — PX: CERVICAL WOUND DEBRIDEMENT: SHX6694

## 2016-07-07 LAB — CBC WITH DIFFERENTIAL/PLATELET
Basophils Absolute: 0 10*3/uL (ref 0.0–0.1)
Basophils Relative: 0 %
EOS ABS: 0.2 10*3/uL (ref 0.0–0.7)
EOS PCT: 2 %
HCT: 33.4 % — ABNORMAL LOW (ref 36.0–46.0)
Hemoglobin: 10.4 g/dL — ABNORMAL LOW (ref 12.0–15.0)
LYMPHS ABS: 1.8 10*3/uL (ref 0.7–4.0)
LYMPHS PCT: 17 %
MCH: 27.6 pg (ref 26.0–34.0)
MCHC: 31.1 g/dL (ref 30.0–36.0)
MCV: 88.6 fL (ref 78.0–100.0)
MONOS PCT: 13 %
Monocytes Absolute: 1.4 10*3/uL — ABNORMAL HIGH (ref 0.1–1.0)
Neutro Abs: 7.4 10*3/uL (ref 1.7–7.7)
Neutrophils Relative %: 68 %
PLATELETS: 298 10*3/uL (ref 150–400)
RBC: 3.77 MIL/uL — AB (ref 3.87–5.11)
RDW: 13.5 % (ref 11.5–15.5)
WBC: 10.8 10*3/uL — AB (ref 4.0–10.5)

## 2016-07-07 LAB — BASIC METABOLIC PANEL
Anion gap: 7 (ref 5–15)
BUN: 6 mg/dL (ref 6–20)
CHLORIDE: 106 mmol/L (ref 101–111)
CO2: 25 mmol/L (ref 22–32)
CREATININE: 0.74 mg/dL (ref 0.44–1.00)
Calcium: 8.7 mg/dL — ABNORMAL LOW (ref 8.9–10.3)
GFR calc Af Amer: >60 mL/min (ref >60)
GFR calc non Af Amer: >60 mL/min (ref >60)
GLUCOSE: 102 mg/dL — AB (ref 65–99)
POTASSIUM: 4.3 mmol/L (ref 3.5–5.1)
SODIUM: 138 mmol/L (ref 135–145)

## 2016-07-07 LAB — URINE CULTURE

## 2016-07-07 LAB — GLUCOSE, CAPILLARY
GLUCOSE-CAPILLARY: 90 mg/dL (ref 65–99)
GLUCOSE-CAPILLARY: 93 mg/dL (ref 65–99)
Glucose-Capillary: 103 mg/dL — ABNORMAL HIGH (ref 65–99)
Glucose-Capillary: 116 mg/dL — ABNORMAL HIGH (ref 65–99)
Glucose-Capillary: 159 mg/dL — ABNORMAL HIGH (ref 65–99)

## 2016-07-07 LAB — C-REACTIVE PROTEIN: CRP: 19.3 mg/dL — ABNORMAL HIGH (ref <1.0)

## 2016-07-07 SURGERY — CERVICAL WOUND DEBRIDEMENT
Anesthesia: General | Site: Neck

## 2016-07-07 MED ORDER — MEPERIDINE HCL 25 MG/ML IJ SOLN: 6.2500 mg | INTRAMUSCULAR | Status: DC | PRN

## 2016-07-07 MED ORDER — ROCURONIUM BROMIDE 50 MG/5ML IV SOSY
PREFILLED_SYRINGE | INTRAVENOUS | Status: AC
  Filled 2016-07-07: qty 5

## 2016-07-07 MED ORDER — VANCOMYCIN HCL 500 MG IV SOLR
INTRAVENOUS | Status: DC | PRN
  Administered 2016-07-07: 1000 mg

## 2016-07-07 MED ORDER — MIDAZOLAM HCL 2 MG/2ML IJ SOLN
INTRAMUSCULAR | Status: AC
  Filled 2016-07-07: qty 2

## 2016-07-07 MED ORDER — FENTANYL CITRATE (PF) 250 MCG/5ML IJ SOLN
INTRAMUSCULAR | Status: AC
  Filled 2016-07-07: qty 5

## 2016-07-07 MED ORDER — PHENYLEPHRINE HCL 10 MG/ML IJ SOLN
INTRAVENOUS | Status: DC | PRN
  Administered 2016-07-07: 20 ug/min via INTRAVENOUS

## 2016-07-07 MED ORDER — SODIUM CHLORIDE 0.45 % IV SOLN
INTRAVENOUS | Status: DC
  Administered 2016-07-07: 13:00:00 via INTRAVENOUS

## 2016-07-07 MED ORDER — GENTAMICIN SULFATE 40 MG/ML IJ SOLN
INTRAMUSCULAR | Status: AC
  Filled 2016-07-07: qty 2

## 2016-07-07 MED ORDER — ONDANSETRON HCL 4 MG/2ML IJ SOLN
INTRAMUSCULAR | Status: AC
  Filled 2016-07-07: qty 2

## 2016-07-07 MED ORDER — LIDOCAINE 2% (20 MG/ML) 5 ML SYRINGE
INTRAMUSCULAR | Status: DC | PRN
  Administered 2016-07-07: 80 mg via INTRAVENOUS

## 2016-07-07 MED ORDER — HYDROMORPHONE HCL 1 MG/ML IJ SOLN
1.0000 mg | INTRAMUSCULAR | Status: DC | PRN
  Administered 2016-07-07 – 2016-07-08 (×2): 1 mg via INTRAVENOUS
  Filled 2016-07-07 (×3): qty 1

## 2016-07-07 MED ORDER — ACETAMINOPHEN 650 MG RE SUPP: 650.0000 mg | Freq: Four times a day (QID) | RECTAL | Status: DC | PRN

## 2016-07-07 MED ORDER — VANCOMYCIN HCL 1000 MG IV SOLR
INTRAVENOUS | Status: AC
  Filled 2016-07-07: qty 1000

## 2016-07-07 MED ORDER — LIDOCAINE 2% (20 MG/ML) 5 ML SYRINGE
INTRAMUSCULAR | Status: AC
  Filled 2016-07-07: qty 5

## 2016-07-07 MED ORDER — FENTANYL CITRATE (PF) 100 MCG/2ML IJ SOLN
INTRAMUSCULAR | Status: DC | PRN
  Administered 2016-07-07: 100 ug via INTRAVENOUS
  Administered 2016-07-07 (×2): 50 ug via INTRAVENOUS

## 2016-07-07 MED ORDER — ONDANSETRON HCL 4 MG/2ML IJ SOLN
INTRAMUSCULAR | Status: DC | PRN
  Administered 2016-07-07: 4 mg via INTRAVENOUS

## 2016-07-07 MED ORDER — ROCURONIUM BROMIDE 10 MG/ML (PF) SYRINGE
PREFILLED_SYRINGE | INTRAVENOUS | Status: DC | PRN
  Administered 2016-07-07: 40 mg via INTRAVENOUS

## 2016-07-07 MED ORDER — SUGAMMADEX SODIUM 200 MG/2ML IV SOLN
INTRAVENOUS | Status: AC
  Filled 2016-07-07: qty 2

## 2016-07-07 MED ORDER — VANCOMYCIN HCL 500 MG IV SOLR
INTRAVENOUS | Status: AC
  Filled 2016-07-07: qty 500

## 2016-07-07 MED ORDER — OXYCODONE HCL 5 MG PO TABS
5.0000 mg | ORAL_TABLET | ORAL | Status: DC | PRN
  Administered 2016-07-08 – 2016-07-10 (×10): 10 mg via ORAL
  Filled 2016-07-07 (×10): qty 2

## 2016-07-07 MED ORDER — LACTATED RINGERS IV SOLN
INTRAVENOUS | Status: DC | PRN
  Administered 2016-07-07 (×2): via INTRAVENOUS

## 2016-07-07 MED ORDER — VANCOMYCIN HCL IN DEXTROSE 1-5 GM/200ML-% IV SOLN
1000.0000 mg | Freq: Two times a day (BID) | INTRAVENOUS | Status: DC
  Administered 2016-07-07 – 2016-07-09 (×4): 1000 mg via INTRAVENOUS
  Filled 2016-07-07 (×4): qty 200

## 2016-07-07 MED ORDER — ACETAMINOPHEN 325 MG PO TABS: 650.0000 mg | ORAL_TABLET | Freq: Four times a day (QID) | ORAL | Status: DC | PRN

## 2016-07-07 MED ORDER — PHENYLEPHRINE 40 MCG/ML (10ML) SYRINGE FOR IV PUSH (FOR BLOOD PRESSURE SUPPORT)
PREFILLED_SYRINGE | INTRAVENOUS | Status: AC
  Filled 2016-07-07: qty 40

## 2016-07-07 MED ORDER — HYDROMORPHONE HCL 1 MG/ML IJ SOLN: 1.0000 mg | INTRAMUSCULAR | Status: DC | PRN

## 2016-07-07 MED ORDER — PROPOFOL 10 MG/ML IV BOLUS
INTRAVENOUS | Status: DC | PRN
  Administered 2016-07-07: 180 mg via INTRAVENOUS

## 2016-07-07 MED ORDER — PHENYLEPHRINE 40 MCG/ML (10ML) SYRINGE FOR IV PUSH (FOR BLOOD PRESSURE SUPPORT)
PREFILLED_SYRINGE | INTRAVENOUS | Status: DC | PRN
  Administered 2016-07-07: 80 ug via INTRAVENOUS

## 2016-07-07 MED ORDER — PROMETHAZINE HCL 25 MG/ML IJ SOLN: 6.2500 mg | INTRAMUSCULAR | Status: DC | PRN

## 2016-07-07 MED ORDER — SUGAMMADEX SODIUM 200 MG/2ML IV SOLN
INTRAVENOUS | Status: DC | PRN
  Administered 2016-07-07: 200 mg via INTRAVENOUS

## 2016-07-07 MED ORDER — GENTAMICIN SULFATE 40 MG/ML IJ SOLN
INTRAMUSCULAR | Status: DC | PRN
  Administered 2016-07-07: 120 mg

## 2016-07-07 MED ORDER — ONDANSETRON HCL 4 MG/2ML IJ SOLN: 4.0000 mg | Freq: Four times a day (QID) | INTRAMUSCULAR | Status: DC | PRN

## 2016-07-07 MED ORDER — MIDAZOLAM HCL 5 MG/5ML IJ SOLN
INTRAMUSCULAR | Status: DC | PRN
  Administered 2016-07-07: 2 mg via INTRAVENOUS

## 2016-07-07 MED ORDER — METOCLOPRAMIDE HCL 5 MG/ML IJ SOLN: 5.0000 mg | Freq: Three times a day (TID) | INTRAMUSCULAR | Status: DC | PRN

## 2016-07-07 MED ORDER — METOCLOPRAMIDE HCL 5 MG PO TABS: 5.0000 mg | ORAL_TABLET | Freq: Three times a day (TID) | ORAL | Status: DC | PRN

## 2016-07-07 MED ORDER — HYDROMORPHONE HCL 1 MG/ML IJ SOLN: 0.2500 mg | INTRAMUSCULAR | Status: DC | PRN

## 2016-07-07 MED ORDER — ONDANSETRON HCL 4 MG PO TABS
4.0000 mg | ORAL_TABLET | Freq: Four times a day (QID) | ORAL | Status: DC | PRN
Start: 2016-07-07 — End: 2016-07-07

## 2016-07-07 MED ORDER — PROPOFOL 10 MG/ML IV BOLUS
INTRAVENOUS | Status: AC
  Filled 2016-07-07: qty 20

## 2016-07-07 SURGICAL SUPPLY — 56 items
BLADE CLIPPER SURG (BLADE) IMPLANT
BLADE SURG 15 STRL LF DISP TIS (BLADE) ×1 IMPLANT
BLADE SURG 15 STRL SS (BLADE) ×1
BUR ROUND FLUTED 4 SOFT TCH (BURR) IMPLANT
CANISTER WOUND CARE 500ML ATS (WOUND CARE) ×2 IMPLANT
CORDS BIPOLAR (ELECTRODE) IMPLANT
COVER SURGICAL LIGHT HANDLE (MISCELLANEOUS) ×2 IMPLANT
DERMABOND ADVANCED (GAUZE/BANDAGES/DRESSINGS)
DERMABOND ADVANCED .7 DNX12 (GAUZE/BANDAGES/DRESSINGS) IMPLANT
DRAPE HALF SHEET 40X57 (DRAPES) ×2 IMPLANT
DRAPE INCISE IOBAN 66X45 STRL (DRAPES) ×2 IMPLANT
DRAPE SURG 17X23 STRL (DRAPES) ×10 IMPLANT
DRSG MEPILEX BORDER 4X4 (GAUZE/BANDAGES/DRESSINGS) ×2 IMPLANT
DRSG MEPILEX BORDER 4X8 (GAUZE/BANDAGES/DRESSINGS) ×2 IMPLANT
DRSG VAC ATS SM SENSATRAC (GAUZE/BANDAGES/DRESSINGS) ×2 IMPLANT
DURAPREP 26ML APPLICATOR (WOUND CARE) ×2 IMPLANT
ELECT REM PT RETURN 9FT ADLT (ELECTROSURGICAL) ×2
ELECTRODE REM PT RTRN 9FT ADLT (ELECTROSURGICAL) ×1 IMPLANT
EVACUATOR 1/8 PVC DRAIN (DRAIN) IMPLANT
GAUZE XEROFORM 5X9 LF (GAUZE/BANDAGES/DRESSINGS) ×2 IMPLANT
GLOVE BIOGEL PI IND STRL 8 (GLOVE) ×2 IMPLANT
GLOVE BIOGEL PI INDICATOR 8 (GLOVE) ×2
GLOVE ORTHO TXT STRL SZ7.5 (GLOVE) ×4 IMPLANT
GOWN STRL REUS W/ TWL LRG LVL3 (GOWN DISPOSABLE) ×1 IMPLANT
GOWN STRL REUS W/ TWL XL LVL3 (GOWN DISPOSABLE) ×1 IMPLANT
GOWN STRL REUS W/TWL 2XL LVL3 (GOWN DISPOSABLE) IMPLANT
GOWN STRL REUS W/TWL LRG LVL3 (GOWN DISPOSABLE) ×1
GOWN STRL REUS W/TWL XL LVL3 (GOWN DISPOSABLE) ×1
KIT BASIN OR (CUSTOM PROCEDURE TRAY) ×2 IMPLANT
KIT ROOM TURNOVER OR (KITS) ×2 IMPLANT
KIT STIMULAN RAPID CURE 5CC (Orthopedic Implant) ×2 IMPLANT
MANIFOLD NEPTUNE II (INSTRUMENTS) ×2 IMPLANT
NEEDLE 1/2 CIR MAYO (NEEDLE) ×2 IMPLANT
NEEDLE BONE MARROW 8GAX6 (NEEDLE) ×2 IMPLANT
NS IRRIG 1000ML POUR BTL (IV SOLUTION) ×2 IMPLANT
PACK ORTHO CERVICAL (CUSTOM PROCEDURE TRAY) ×2 IMPLANT
PAD ABD 8X10 STRL (GAUZE/BANDAGES/DRESSINGS) ×2 IMPLANT
PAD ARMBOARD 7.5X6 YLW CONV (MISCELLANEOUS) ×4 IMPLANT
SPONGE LAP 4X18 X RAY DECT (DISPOSABLE) ×6 IMPLANT
SPONGE SURGIFOAM ABS GEL 100 (HEMOSTASIS) IMPLANT
STAPLER VISISTAT 35W (STAPLE) IMPLANT
SUT BONE WAX W31G (SUTURE) ×2 IMPLANT
SUT STEEL 1 (SUTURE) IMPLANT
SUT STEEL 2 (SUTURE) IMPLANT
SUT VIC AB 0 CT1 27 (SUTURE) ×1
SUT VIC AB 0 CT1 27XBRD ANBCTR (SUTURE) ×1 IMPLANT
SUT VIC AB 2-0 CT1 27 (SUTURE) ×1
SUT VIC AB 2-0 CT1 TAPERPNT 27 (SUTURE) ×1 IMPLANT
SUT VIC AB 3-0 X1 27 (SUTURE) IMPLANT
SUT VICRYL 0 TIES 12 18 (SUTURE) ×2 IMPLANT
SUT VICRYL 4-0 PS2 18IN ABS (SUTURE) IMPLANT
TOWEL OR 17X24 6PK STRL BLUE (TOWEL DISPOSABLE) ×2 IMPLANT
TOWEL OR 17X26 10 PK STRL BLUE (TOWEL DISPOSABLE) ×2 IMPLANT
TRAY FOLEY W/METER SILVER 16FR (SET/KITS/TRAYS/PACK) IMPLANT
WATER STERILE IRR 1000ML POUR (IV SOLUTION) ×2 IMPLANT
YANKAUER SUCT BULB TIP NO VENT (SUCTIONS) IMPLANT

## 2016-07-07 NOTE — Brief Op Note (Signed)
07/05/2016 - 07/07/2016  8:51 AM  PATIENT:  Kara [name]  61 y.o. female  PRE-OPERATIVE DIAGNOSIS:  posterior wound infection  POST-OPERATIVE DIAGNOSIS:  posterior wound infection  PROCEDURE:  Procedure(s): IRRIGATION AND DEBRIDEMENT POSTERIOR NECK (N/A)  SURGEON:  Surgeon(s) and Role:    * Eldred Manges, MD - Primary  PHYSICIAN ASSISTANT:   ASSISTANTS: none   ANESTHESIA:   general  EBL:  Total I/O In: 1000 [I.V.:1000] Out: 30 [Blood:30]  BLOOD ADMINISTERED:none  DRAINS: small VAC   LOCAL MEDICATIONS USED:  NONE  SPECIMEN:  No Specimen   Aerobic and anaerobic cultures done intra-op  DISPOSITION OF SPECIMEN:  N/A  COUNTS:  YES  TOURNIQUET:  * No tourniquets in log *  DICTATION: .Dragon Dictation  PLAN OF CARE: already IP  PATIENT DISPOSITION:  PACU - hemodynamically stable.   Delay start of Pharmacological VTE agent (>24hrs) due to surgical blood loss or risk of bleeding: no

## 2016-07-07 NOTE — Anesthesia Postprocedure Evaluation (Signed)
Anesthesia Post Note  Patient: Kara [name]  Procedure(s) Performed: Procedure(s) (LRB): IRRIGATION AND DEBRIDEMENT POSTERIOR NECK (N/A)  Patient location during evaluation: PACU Anesthesia Type: General Level of consciousness: awake and alert and oriented Pain management: pain level controlled Vital Signs Assessment: post-procedure vital signs reviewed and stable Respiratory status: spontaneous breathing, nonlabored ventilation and respiratory function stable Cardiovascular status: blood pressure returned to baseline and stable Postop Assessment: no signs of nausea or vomiting Anesthetic complications: no       Last Vitals:  Vitals:   07/07/16 0935 07/07/16 0945  BP: (!) 95/54 97/62  Pulse: 84 81  Resp: 20 20  Temp:  36.6 C    Last Pain:  Vitals:   07/07/16 0945  TempSrc:   PainSc: Asleep                 Danni Leabo A.

## 2016-07-07 NOTE — Progress Notes (Signed)
Patient ID: Kara [name], female   DOB: Apr 17, 1955, 61 y.o.   MRN: [id number]  PROGRESS NOTE    Hadia Minier  UYQ:[id number] DOB: 07/01/1955 DOA: 07/05/2016 PCP: Felix Pacini, DO   Brief Narrative:  61 y.o.femalewith medical history significant of neck pain, fibromyalgia, history of opioid dependence, rheumatoid arthritis who recently had cervical fusion of C5-C7 spinous process wiring done on 28 th of March 2018 presented with worsening pain, fever and drainage from the wound site. She was admitted with probable wound infection, SIRS and started on iv antibiotics. She had debridement of the wound done by orthopedics today  Assessment & Plan:   Principal Problem:   Wound infection Active Problems:   Fibromyalgia   S/P cervical spinal fusion   Chronic pain   Prediabetes   SIRS (systemic inflammatory response syndrome) (HCC)   Pyuria   Post-operative infection   1. Wound infection: of the recent cervical fusion wound - Status post debridement of the wound done today by orthopedics. Follow-up OR cultures. continue iv Vanc and Zosyn. Vanc dosing as per pharmacy. Patient currently has a wound VAC. Follow-up further recommend is from orthopedics. -pain management: will be careful with opiates as patient has history of narcotic dependence. Monitor mental status and hemodynamics.  -follow AM labs -PT eval  2. Leukocytosis: probably due to #1; improved. follow AM labs  3. SIRS: secondary to wound infection   4. Chronic pain - difficult to manage history of opioid dependence and drug-seeking behavior -cautious use of iv dilaudid and oxycodone orally  5.  Fibromyalgia:continue home medications  6.  Prediabetes: monitor blood sugars; cover with insulinsliding scale  7.  Pyuria:   urine culture showing multiple species. Patient is already and proximal spectrum antibiotics, so will not order a repeat urine culture.  DVT prophylaxis: SCDs; start Lovenox once cleared by orthopedics    Code Status: FULL CODE  Family Communication:  discussed with husband present at bedside Disposition Plan: Home care vs SNF in 1-3 days  Consultants:   Orthopedics: Dr. Ophelia Charter  Procedures:  None  Antimicrobials: Vanc & Zosyn 07/05/16   Subjective: Patient seen and examined at bedside. She is awake and answering some questions complains of neck pain.  Objective: Vitals:   07/07/16 0935 07/07/16 0945 07/07/16 1006 07/07/16 1355  BP: (!) 95/54 97/62 (!) 109/59 (!) 92/58  Pulse: 84 81 79 72  Resp: 20 20 18 18   Temp:  97.8 F (36.6 C) 98.8 F (37.1 C) 98 F (36.7 C)  TempSrc:   Oral Oral  SpO2: 98% 97% 97% 97%  Weight:      Height:        Intake/Output Summary (Last 24 hours) at 07/07/16 1405 Last data filed at 07/07/16 0947  Gross per 24 hour  Intake             1660 ml  Output               60 ml  Net             1600 ml   Filed Weights   07/05/16 1652 07/06/16 0033  Weight: 72.1 kg (159 lb) 70.3 kg (155 lb)    Examination:  General exam: Appears calm and comfortable.  Respiratory system: Clear to auscultation. Respiratory effort normal. Cardiovascular system: S1 & S2 heard, RRR. Gastrointestinal system: Abdomen is nondistended, soft and nontender. No organomegaly or masses felt. Normal bowel sounds heard. Central nervous system:  Awake; answers few questions; moving extremities Extremities: no  cyanosis, clubbing, edema    Data Reviewed: I have personally reviewed following labs and imaging studies  CBC:  Recent Labs Lab 07/05/16 1817 07/07/16 0513  WBC 20.0* 10.8*  NEUTROABS 17.3* 7.4  HGB 12.4 10.4*  HCT 38.0 33.4*  MCV 87.0 88.6  PLT 341 298   Basic Metabolic Panel:  Recent Labs Lab 07/05/16 1817 07/07/16 0513  NA 134* 138  K 4.3 4.3  CL 102 106  CO2 22 25  GLUCOSE 116* 102*  BUN 23* 6  CREATININE 0.84 0.74  CALCIUM 9.1 8.7*   GFR: Estimated Creatinine Clearance: 72.8 mL/min (by C-G formula based on SCr of 0.74 mg/dL). Liver  Function Tests:  Recent Labs Lab 07/05/16 1817  AST 29  ALT 39  ALKPHOS 115  BILITOT 0.5  PROT 6.6  ALBUMIN 3.6   No results for input(s): LIPASE, AMYLASE in the last 168 hours. No results for input(s): AMMONIA in the last 168 hours. Coagulation Profile: No results for input(s): INR, PROTIME in the last 168 hours. Cardiac Enzymes: No results for input(s): CKTOTAL, CKMB, CKMBINDEX, TROPONINI in the last 168 hours. BNP (last 3 results) No results for input(s): PROBNP in the last 8760 hours. HbA1C: No results for input(s): HGBA1C in the last 72 hours. CBG:  Recent Labs Lab 07/06/16 1602 07/06/16 2002 07/07/16 0006 07/07/16 0335 07/07/16 1144  GLUCAP 94 162* 116* 103* 159*   Lipid Profile: No results for input(s): CHOL, HDL, LDLCALC, TRIG, CHOLHDL, LDLDIRECT in the last 72 hours. Thyroid Function Tests: No results for input(s): TSH, T4TOTAL, FREET4, T3FREE, THYROIDAB in the last 72 hours. Anemia Panel: No results for input(s): VITAMINB12, FOLATE, FERRITIN, TIBC, IRON, RETICCTPCT in the last 72 hours. Sepsis Labs:  Recent Labs Lab 07/05/16 1949 07/05/16 2219  LATICACIDVEN 0.73 0.96    Recent Results (from the past 240 hour(s))  Blood culture (routine x 2)     Status: None (Preliminary result)   Collection Time: 07/05/16  7:30 PM  Result Value Ref Range Status   Specimen Description BLOOD RIGHT ANTECUBITAL  Final   Special Requests   Final    BOTTLES DRAWN AEROBIC AND ANAEROBIC Blood Culture adequate volume   Culture NO GROWTH 2 DAYS  Final   Report Status PENDING  Incomplete  Blood culture (routine x 2)     Status: None (Preliminary result)   Collection Time: 07/05/16  7:40 PM  Result Value Ref Range Status   Specimen Description BLOOD LEFT ANTECUBITAL  Final   Special Requests BOTTLES DRAWN AEROBIC AND ANAEROBIC 5CC  Final   Culture NO GROWTH 2 DAYS  Final   Report Status PENDING  Incomplete  Urine culture     Status: Abnormal   Collection Time: 07/05/16   9:56 PM  Result Value Ref Range Status   Specimen Description URINE, CLEAN CATCH  Final   Special Requests NONE  Final   Culture MULTIPLE SPECIES PRESENT, SUGGEST RECOLLECTION (A)  Final   Report Status 07/07/2016 FINAL  Final  Respiratory Panel by PCR     Status: None   Collection Time: 07/06/16 12:31 AM  Result Value Ref Range Status   Adenovirus NOT DETECTED NOT DETECTED Final   Coronavirus 229E NOT DETECTED NOT DETECTED Final   Coronavirus HKU1 NOT DETECTED NOT DETECTED Final   Coronavirus NL63 NOT DETECTED NOT DETECTED Final   Coronavirus OC43 NOT DETECTED NOT DETECTED Final   Metapneumovirus NOT DETECTED NOT DETECTED Final   Rhinovirus / Enterovirus NOT DETECTED NOT DETECTED Final  Influenza A NOT DETECTED NOT DETECTED Final   Influenza B NOT DETECTED NOT DETECTED Final   Parainfluenza Virus 1 NOT DETECTED NOT DETECTED Final   Parainfluenza Virus 2 NOT DETECTED NOT DETECTED Final   Parainfluenza Virus 3 NOT DETECTED NOT DETECTED Final   Parainfluenza Virus 4 NOT DETECTED NOT DETECTED Final   Respiratory Syncytial Virus NOT DETECTED NOT DETECTED Final   Bordetella pertussis NOT DETECTED NOT DETECTED Final   Chlamydophila pneumoniae NOT DETECTED NOT DETECTED Final   Mycoplasma pneumoniae NOT DETECTED NOT DETECTED Final  Surgical pcr screen     Status: Abnormal   Collection Time: 07/06/16  8:24 AM  Result Value Ref Range Status   MRSA, PCR NEGATIVE NEGATIVE Final   Staphylococcus aureus POSITIVE (A) NEGATIVE Final    Comment:        The Xpert SA Assay (FDA approved for NASAL specimens in patients over 96 years of age), is one component of a comprehensive surveillance program.  Test performance has been validated by [facility] for patients greater than or equal to 72 year old. It is not intended to diagnose infection nor to guide or monitor treatment.   Aerobic Culture (superficial specimen)     Status: None (Preliminary result)   Collection Time: 07/06/16  8:29 AM    Result Value Ref Range Status   Specimen Description ABSCESS NECK  Final   Special Requests POSTERIOR NECK INCISION  Final   Gram Stain   Final    RARE WBC PRESENT, PREDOMINANTLY PMN RARE GRAM POSITIVE COCCI IN SINGLES    Culture   Final    FEW UNIDENTIFIED ORGANISM IDENTIFICATION AND SUSCEPTIBILITIES TO FOLLOW    Report Status PENDING  Incomplete  Aerobic/Anaerobic Culture (surgical/deep wound)     Status: None (Preliminary result)   Collection Time: 07/07/16  8:09 AM  Result Value Ref Range Status   Specimen Description WOUND NECK  Final   Special Requests POSTERIOR  Final   Gram Stain   Final    RARE WBC PRESENT, PREDOMINANTLY PMN RARE GRAM POSITIVE COCCI IN PAIRS    Culture PENDING  Incomplete   Report Status PENDING  Incomplete         Radiology Studies: Dg Chest 2 View  Result Date: 07/05/2016 CLINICAL DATA:  Postop fever for 3 days. Status post cervical spine fusion. EXAM: CHEST  2 VIEW COMPARISON:  12/13/2015 FINDINGS: Low lung volumes noted with mild bibasilar atelectasis. No evidence of pulmonary consolidation or pleural effusion. Heart size and mediastinal contours are within normal limits. Right shoulder prosthesis and cervical spine fusion hardware again noted. IMPRESSION: Low lung volumes with mild bibasilar atelectasis. Electronically Signed   By: Myles Rosenthal M.D.   On: 07/05/2016 18:49        Scheduled Meds: . Chlorhexidine Gluconate Cloth  6 each Topical Daily  . FLUoxetine  10 mg Oral Daily  . insulin aspart  0-9 Units Subcutaneous Q4H  . mupirocin ointment  1 application Nasal BID  . piperacillin-tazobactam (ZOSYN)  IV  3.375 g Intravenous Q8H  . vancomycin  1,000 mg Intravenous Q12H  . venlafaxine XR  150 mg Oral Q breakfast   Continuous Infusions: . sodium chloride 75 mL/hr at 07/07/16 1231       Glade Lloyd, MD [facility] Pager [phone] If 7PM-7AM, please contact night-coverage [email or web address] Password [email or web address] 07/07/2016,  2:05 PM

## 2016-07-07 NOTE — Progress Notes (Signed)
Pharmacy Antibiotic Note  Kara [name] is a 61 y.o. female admitted on 07/05/2016 with postoperative cervical wound infection.  Pharmacy has been consulted for Vancomycin and Zosyn dosing.   Will increase vancomycin dose to target 15-20 mcg/mL with recent spinal surgery and removal of graft. WBC is trending down. S/P I&D this AM. Currently afebrile.   Plan: Increase Vancomycin to 1g IV every 12 hours.  Continue Zosyn 3.375g IV every 8 hours- each dose over 4 hours.   Consider changing Zosyn to Rocephin 2 g IV q24h for better penetration at spinal site.   Height: 5\' 4"  (162.6 cm) Weight: 155 lb (70.3 kg) IBW/kg (Calculated) : 54.7  Temp (24hrs), Avg:98.9 F (37.2 C), Min:97.7 F (36.5 C), Max:100.6 F (38.1 C)   Recent Labs Lab 07/05/16 1817 07/05/16 1949 07/05/16 2219 07/07/16 0513  WBC 20.0*  --   --  10.8*  CREATININE 0.84  --   --  0.74  LATICACIDVEN  --  0.73 0.96  --     Estimated Creatinine Clearance: 72.8 mL/min (by C-G formula based on SCr of 0.74 mg/dL).    Allergies  Allergen Reactions  . Aspirin Anaphylaxis  . Ibuprofen Anaphylaxis  . Ativan [Lorazepam] Other (See Comments)    Makes agitated, combative   . Ketamine Other (See Comments)    Hallucinations  . Toradol [Ketorolac Tromethamine] Itching, Swelling and Rash    UNSPECIFIED REACTION   . Tramadol Hcl Itching, Swelling and Rash    SWELLING REACTION UNSPECIFIED  Tolerates Dilaudid 06/2016.  TDD.    Antimicrobials this admission: Vanc 4/11 >> Zosyn 4/11 >>  Dose adjustments this admission: none  Microbiology results: 4/12 Resp panel - negative 4/12 Neck abscess - rare GPC single 4/13 Neck abscess - rare GPC pairs 4/11 Blood x2 >> 4/11 Urine >> 4/12 MRSA pcr negative  Thank you for allowing pharmacy to be a part of this patient's care.  6/12 07/07/2016 1:33 PM

## 2016-07-07 NOTE — Progress Notes (Signed)
Physical Therapy Treatment Patient Details Name: Kara [name] MRN: [id number] DOB: June 06, 1955 Today's Date: 07/07/2016    History of Present Illness Pt is 61 y/o female presenting with surgical infection from C5-C7 fusion on 06/21/16. PMH includes fibromyalgia, RA, CKD, opiod dependence, and has had previous R shoulder and back surgeries.     PT Comments    Pt with decreased pain today and able to work with PT. Pt is supervision with bed mobility and min guard with transfers and ambulation of 280 ft with RW. Pt requires continued skilled PT for transfer and gait training and to improve safety awareness and endurance to be able to safely navigate her home environment.    Follow Up Recommendations  Home health PT;Supervision/Assistance - 24 hour     Equipment Recommendations  Rolling walker with 5" wheels;3in1 (PT)    Recommendations for Other Services       Precautions / Restrictions Precautions Precautions: Cervical Required Braces or Orthoses: Cervical Brace Cervical Brace: Soft collar Restrictions Weight Bearing Restrictions: No    Mobility  Bed Mobility Overal bed mobility: Needs Assistance Bed Mobility: Sidelying to Sit;Rolling Rolling: Supervision Sidelying to sit: Supervision       General bed mobility comments: Verbal cues for maintaining head alignment, pt in hurry to get to bathroom and moving with decreased safety awareness    Transfers Overall transfer level: Needs assistance Equipment used: None Transfers: Sit to/from Stand Sit to Stand: Min guard         General transfer comment: vc for waiting for PT until IV and wound vac could be unplugged  Ambulation/Gait Ambulation/Gait assistance: Min assist Ambulation Distance (Feet): 150 Feet Assistive device: Rolling walker (2 wheeled);None Gait Pattern/deviations: Step-through pattern;Decreased stride length;Staggering right;Staggering left Gait velocity: Decreased Gait velocity interpretation: Below  normal speed for age/gender General Gait Details: Pt with decreased safety awareness vc for staying inside her walker, walking in a straight line and keeping close to her IV pole        Balance Overall balance assessment: Needs assistance Sitting-balance support: No upper extremity supported;Feet supported Sitting balance-Leahy Scale: Fair     Standing balance support: No upper extremity supported;Bilateral upper extremity supported;During functional activity Standing balance-Leahy Scale: Poor Standing balance comment: Reliant on RW for steadying. Attempted without AD, however, pt requiring min A for balance.                              Cognition Arousal/Alertness: Awake/alert Behavior During Therapy: WFL for tasks assessed/performed;Impulsive Overall Cognitive Status: History of cognitive impairments - at baseline                                 General Comments: impulsive with her actions and easily distracted additional cuing to stay on task         General Comments General comments (skin integrity, edema, etc.): Pt not limited by pain today.      Pertinent Vitals/Pain Pain Assessment: 0-10 Pain Score: 2  Pain Location: neck  Pain Descriptors / Indicators: Grimacing;Constant;Throbbing Pain Intervention(s): Limited activity within patient's tolerance;Monitored during session  VSS           PT Goals (current goals can now be found in the care plan section) Acute Rehab PT Goals Patient Stated Goal: to decrease pain  PT Goal Formulation: With patient Time For Goal Achievement: 07/20/16 Potential to Achieve Goals: Fair  Frequency    Min 3X/week      PT Plan Current plan remains appropriate    Co-evaluation             End of Session Equipment Utilized During Treatment: Gait belt;Cervical collar Activity Tolerance: Patient limited by pain Patient left: in bed;with call bell/phone within reach Nurse Communication: Mobility  status PT Visit Diagnosis: Unsteadiness on feet (R26.81);Pain Pain - part of body:  (back and neck)     Time: 3474-2595 PT Time Calculation (min) (ACUTE ONLY): 26 min  Charges:  $Gait Training: 8-22 mins $Therapeutic Activity: 8-22 mins                    G Codes:  Functional Assessment Tool Used: AM-PAC 6 Clicks Basic Mobility;Clinical judgement Functional Limitation: Mobility: Walking and moving around Mobility: Walking and Moving Around Current Status (G3875): At least 40 percent but less than 60 percent impaired, limited or restricted Mobility: Walking and Moving Around Goal Status [phone]): At least 1 percent but less than 20 percent impaired, limited or restricted    Kara [name] PT, DPT Acute Rehabilitation  [phone] Pager [phone]    Kara [name] Fleet 07/07/2016, 3:16 PM

## 2016-07-07 NOTE — Op Note (Signed)
Preop diagnosis: Postop cervical fusion postoperative wound infection  Postop diagnosis: Deep postoperative wound infection post posterior cervical fusion.  Procedure: Posterior cervical Sharp excisional debridement of skin subcutaneous tissue. Removal of Vitoss graft. Stimulan  antibiotic beads with tobramycin and vancomycin and small VAC application  Surgeon: Annell Greening M.D.  Anesthesia: Gen.  Procedure 61 year old female had pseudoarthrosis after anterior cervical fusion and had a posterior cervical fusion with iliac aspirate and Vitoss and spinous process wiring. This procedure was 3 weeks ago on 06/21/2016. Patient presented with increased pain and was seen yesterday with erythema of the wound inferior aspect the area was able to be expressed small amount of purulence. The day prior husband stated that her incision and not been red. She has past history of the opioid use chronically and as her pain medication was being weaned she started having increase pain and present herself to the emergency room. She has a low albumin. Culture was obtained yesterday results are pending and she has been on vancomycin and Zosyn.  After Center prepping and draping after intubation with patient prone on the horseshoe head holder the area was squared with 1010 drapes prepped with the DuraPrep. There was purulent drainage from the) aspect of the incision. Usual sterile sheets drapes sterile Mayo stand at the head and thyroid sheet was applied timeout procedure was completed. Patient just received Zosyn within the last hour and no additional antibiotics were given. Sterile skin Loraine Leriche a been used and Betadine Steri-Drape was applied to the skin. Old incision was opened up purulence was noted and aerobic and anaerobic cultures were obtained. Copious irrigation and there was pieces of the V Thomas which were suctioned up. Incision extended down deep in the Vicryl sutures were cut and copious irrigation 2 L using bulb  syringe was performed. Using the scalpel some tissue was debrided along the edges cutting back 1 mm of the skin and subcutaneous tissue. Used on the muscle and the tonsil suctioned down to the lamina exposing the interspinous wire. It appeared that the infection was mostly closer to subcutaneous tissue but all sutures were cut splitting the muscle so that we could expose the entire incision. Copious irrigation removal of all vitoss graft was performed. Stimulan beads were made with the vancomycin and tobramycin. Little bit of additional VAC was left this was sprinkled into the wound and see millimeters were placed deep down the level of the lamina and some subtendinous tissue. Some #1 Vicryl was used to reapproximate the muscle and then incisional back was placed on the skin. Depth of the wound is about 10 cm in length was an 1 cm wide after reapproximation with the Vicryl of the muscle. A low leak rate with the VAC and the it was covered with an ABDs. Patient tolerated procedure well transferred recovery room stable condition.

## 2016-07-07 NOTE — Telephone Encounter (Signed)
Heather called from [facility] asking for the depth of the wound for the wound vac. CB # [phone]

## 2016-07-07 NOTE — Anesthesia Procedure Notes (Signed)
Procedure Name: Intubation Date/Time: 07/07/2016 7:47 AM Performed by: Rise Patience T Pre-anesthesia Checklist: Patient identified, Emergency Drugs available, Suction available and Patient being monitored Patient Re-evaluated:Patient Re-evaluated prior to inductionOxygen Delivery Method: Circle System Utilized Preoxygenation: Pre-oxygenation with 100% oxygen Intubation Type: IV induction Ventilation: Mask ventilation without difficulty Laryngoscope Size: Glidescope and 3 Grade View: Grade I Tube type: Oral Tube size: 7.5 mm Number of attempts: 1 Airway Equipment and Method: Stylet and Oral airway Placement Confirmation: ETT inserted through vocal cords under direct vision,  positive ETCO2 and breath sounds checked- equal and bilateral Secured at: 22 cm Tube secured with: Tape Dental Injury: Teeth and Oropharynx as per pre-operative assessment

## 2016-07-07 NOTE — Telephone Encounter (Signed)
This is per op note-  Depth of the wound is about 10 cm in length was an 1 cm wide after reapproximation with the Vicryl of the muscle.  Please advise what to tell them?

## 2016-07-07 NOTE — Telephone Encounter (Signed)
correct 

## 2016-07-07 NOTE — Consult Note (Signed)
WOC requested to begin Vac dressing changes on Monday.  Pt went to the OR today and Vac is intact with good seal at cont suction, mod mat slightly-bloody drainage in the cannister.  Will plan to perform first post-op dressing change on Monday with either Dr Ophelia Charter or the PA present to assess the wound. Cammie Mcgee MSN, RN, CWOCN, [place], CNS [phone]

## 2016-07-07 NOTE — Care Management Note (Addendum)
Case Management Note  Patient Details  Name: Kara [name] MRN: [id number] Date of Birth: 04-24-1955  Subjective/Objective:                    Action/Plan:  Both forms left on shadow chart. Wound depth needed. DR Ophelia Charter placed VAC this am. Length 10 cm , width is 1 cm will need depth of wound .   Called Dr Ophelia Charter office spoke with Trula Ore. Will need VAC application signed and HHRN order. Patient has [employer] , will have to go through [employer] to arrange [facility], unless [facility] negative pressure system used. If [facility] negative pressure system used [facility] can provide HHRN and PT.  Awaiting call back  Also need order for HHPT  Expected Discharge Date:                  Expected Discharge Plan:  Home w Home Health Services  In-House Referral:     Discharge planning Services     Post Acute Care Choice:    Choice offered to:     DME Arranged:  Vac DME Agency:     HH Arranged:  RN HH Agency:     Status of Service:  In process, will continue to follow  If discussed at Long Length of Stay Meetings, dates discussed:    Additional Comments:  Kingsley Plan, RN 07/07/2016, 10:44 AM

## 2016-07-07 NOTE — Consult Note (Addendum)
WOC assistance requested by the bedside nurse for Vac machine troubleshooting.  Vac is alarming "blockage."  There is small amt clotted blood in the cannister which is clogging the filter and disrupting suction. New cannister applied and it began alarming again immediately.  Removed previous track pad, leaving Vac sponge in place, and applied new track pad.  The hole where the suction pad was applied was very small, so I cut a larger hole before applying new track pad to facilitate suction.  Machine functioning with cont suction at currently, but there is small amt clotted blood already visible underneath the new track pad. Which was just placed.  If  "blockage alarm" re-occurs, then bedside nurse will need to call ortho service, since all trouble shooting measures have already been performed, and Vac machine will not be able to maintain suction if bloody drainage continues. Cammie Mcgee MSN, RN, CWOCN, [place], CNS [phone]

## 2016-07-07 NOTE — Transfer of Care (Signed)
Immediate Anesthesia Transfer of Care Note  Patient: Kara [name]  Procedure(s) Performed: Procedure(s): IRRIGATION AND DEBRIDEMENT POSTERIOR NECK (N/A)  Patient Location: PACU  Anesthesia Type:General  Level of Consciousness: awake, alert  and oriented  Airway & Oxygen Therapy: Patient Spontanous Breathing  Post-op Assessment: Report given to RN, Post -op Vital signs reviewed and stable and Patient moving all extremities X 4  Post vital signs: Reviewed and stable  Last Vitals:  Vitals:   07/06/16 2022 07/07/16 0419  BP: 110/61 (!) 102/59  Pulse: 81 84  Resp: 17 18  Temp: 37.4 C 37.2 C    Last Pain:  Vitals:   07/07/16 0444  TempSrc:   PainSc: 9       Patients Stated Pain Goal: 2 (07/07/16 0444)  Complications: No apparent anesthesia complications

## 2016-07-07 NOTE — Interval H&P Note (Signed)
History and Physical Interval Note:  07/07/2016 7:31 AM  Kara [name]  has presented today for surgery, with the diagnosis of posterior wound infection  The various methods of treatment have been discussed with the patient and family. After consideration of risks, benefits and other options for treatment, the patient has consented to  Procedure(s): IRRIGATION AND DEBRIDEMENT POSTERIOR NECK (N/A) as a surgical intervention .  The patient's history has been reviewed, patient examined, no change in status, stable for surgery.  I have reviewed the patient's chart and labs.  Questions were answered to the patient's satisfaction.     Eldred Manges

## 2016-07-07 NOTE — H&P (View-Only) (Signed)
Reason for Consult:uncotrolled pain post cervical fusion Referring Physician: Starla Link MD  Kara [name] is an 61 y.o. female.  HPI:  Patient post op cervical fusion for pseudarthrosis with weaning of pain meds in patient with past Hx of narcotic pain management per husband her incision looked good yesterday then today erythema and draining at inferior end of incision   Past Medical History:  Diagnosis Date  . ADD (attention deficit disorder)    on Adderal  . Aggressive behavior of adult   . Allergy   . Anxiety   . Arthritis   . Asthma    as a child  . Cellulitis of breast 11/2013   RIGHT BREAST  . Chronic fatigue and immune dysfunction syndrome ([place])   . Chronic kidney disease    due to infection from stone- no regular visits- fine now  . Chronic pain    goes to [facility] for pain control  . Cold sore   . 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 Depression    takes meds daily  . Family history of malignant neoplasm of breast   . Fibromyalgia   . Nephrolithiasis   . Osteoporosis   . Other specified rheumatoid arthritis, right shoulder ([place]) 08/01/2011  . Post-nasal drip    hx of  . Rheumatoid arthritis(714.0)    autoimmune arthritis; methotrexate once/week  . Urinary incontinence     Past Surgical History:  Procedure Laterality Date  . ANTERIOR CERVICAL DECOMP/DISCECTOMY FUSION N/A 03/15/2015   Procedure: Cervical five-six, Cerival six-seven, Anterior Cervical Discectomy and Fusion, Allograft and Plate;  Surgeon: Marybelle Killings, MD;  Location: [facility];  Service: Orthopedics;  Laterality: N/A;  . BACK SURGERY  2011,16   lower back fusion l4-5, s1  . BLADDER SURGERY  2009  . BREAST ENHANCEMENT SURGERY  2003  . breast implants removed    . BREAST SURGERY  10/2013   REMOVAL OF BREAST IMPLANTS  . INCISION AND DRAINAGE ABSCESS Right 01/16/2014   Procedure: INCISION AND DRAINAGE AND OF RIGHT BREAST ABCESS;  Surgeon: Excell Seltzer, MD;  Location: [facility];  Service: General;  Laterality: Right;  . JOINT REPLACEMENT    . LIPOSUCTION  2003   abdominal  . LUMBAR LAMINECTOMY/DECOMPRESSION MICRODISCECTOMY N/A 12/10/2015   Procedure: Right L3-4 Hemilaminectomy, Excision of herniated nucleus pulposus;  Surgeon: Marybelle Killings, MD;  Location: [facility];  Service: Orthopedics;  Laterality: N/A;  . MASS EXCISION  11/03/2011   Procedure: MINOR EXCISION OF MASS;  Surgeon: Cammie Sickle., MD;  Location: [place];  Service: Orthopedics;  Laterality: Left;  debride IP joint, cyst excision left index  . POSTERIOR CERVICAL FUSION/FORAMINOTOMY N/A 06/21/2016   Procedure: POSTERIOR CERVICAL FUSION C5-C7 SPINOUS PROCESS WIRING;  Surgeon: Marybelle Killings, MD;  Location: [place] [facility];  Service: Orthopedics;  Laterality: N/A;  . POSTERIOR FUSION CERVICAL SPINE  06/21/2016   C5 C7  . SHOULDER SURGERY  2012   right  . TONSILLECTOMY  1987  . TOTAL SHOULDER ARTHROPLASTY  08/01/2011   Procedure: TOTAL SHOULDER ARTHROPLASTY;  Surgeon: Johnny Bridge, MD;  Location: [place];  Service: Orthopedics;  Laterality: Right;  Right total shoulder arthroplasty  . TUBAL LIGATION  1988    Family History  Problem Relation Age of Onset  . Arthritis Mother   . Heart disease Mother     ?psvt  . Breast cancer Mother 24    TAH/BSO  .  Cancer Mother   . Mental illness Mother   . COPD Father   . Hypertension Father   . Alcohol abuse Father   . Mental illness Father   . Heart disease Father   . Healthy Daughter   . Breast cancer Maternal Aunt 27    deceased  . Cancer Cousin 83    female; unknown primary  . Colon cancer Paternal Aunt 10    deceased at 54  . Stomach cancer Paternal Uncle 64    deceased at 67  . Alcohol abuse Brother   . Cancer Brother   . Hodgkin's lymphoma Brother   . Mental illness Brother   . HIV Brother   . Healthy Brother   . Healthy Son     Social History:  reports that she quit smoking about 10 years ago. Her  smoking use included Cigarettes. She has a 5.00 pack-year smoking history. She has never used smokeless tobacco. She reports that she does not drink alcohol or use drugs.  Allergies:  Allergies  Allergen Reactions  . Aspirin Anaphylaxis  . Ibuprofen Anaphylaxis  . Ativan [Lorazepam] Other (See Comments)    Makes agitated, combative   . Ketamine Other (See Comments)    Hallucinations  . Toradol [Ketorolac Tromethamine] Itching, Swelling and Rash    UNSPECIFIED REACTION   . Tramadol Hcl Itching, Swelling and Rash    SWELLING REACTION UNSPECIFIED     Medications: I have reviewed the patient's current medications.  Results for orders placed or performed during the hospital encounter of 07/05/16 (from the past 48 hour(s))  CBC with Differential     Status: Abnormal   Collection Time: 07/05/16  6:17 PM  Result Value Ref Range   WBC 20.0 (H) 4.0 - 10.5 K/uL   RBC 4.37 3.87 - 5.11 MIL/uL   Hemoglobin 12.4 12.0 - 15.0 g/dL   HCT 38.0 36.0 - 46.0 %   MCV 87.0 78.0 - 100.0 fL   MCH 28.4 26.0 - 34.0 pg   MCHC 32.6 30.0 - 36.0 g/dL   RDW 13.3 11.5 - 15.5 %   Platelets 341 150 - 400 K/uL   Neutrophils Relative % 87 %   Neutro Abs 17.3 (H) 1.7 - 7.7 K/uL   Lymphocytes Relative 6 %   Lymphs Abs 1.3 0.7 - 4.0 K/uL   Monocytes Relative 7 %   Monocytes Absolute 1.3 (H) 0.1 - 1.0 K/uL   Eosinophils Relative 0 %   Eosinophils Absolute 0.1 0.0 - 0.7 K/uL   Basophils Relative 0 %   Basophils Absolute 0.0 0.0 - 0.1 K/uL  Comprehensive metabolic panel     Status: Abnormal   Collection Time: 07/05/16  6:17 PM  Result Value Ref Range   Sodium 134 (L) 135 - 145 mmol/L   Potassium 4.3 3.5 - 5.1 mmol/L   Chloride 102 101 - 111 mmol/L   CO2 22 22 - 32 mmol/L   Glucose, Bld 116 (H) 65 - 99 mg/dL   BUN 23 (H) 6 - 20 mg/dL   Creatinine, Ser 0.84 0.44 - 1.00 mg/dL   Calcium 9.1 8.9 - 10.3 mg/dL   Total Protein 6.6 6.5 - 8.1 g/dL   Albumin 3.6 3.5 - 5.0 g/dL   AST 29 15 - 41 U/L   ALT 39 14 - 54  U/L   Alkaline Phosphatase 115 38 - 126 U/L   Total Bilirubin 0.5 0.3 - 1.2 mg/dL   GFR calc non Af Amer >60 >60 mL/min  GFR calc Af Amer >60 >60 mL/min    Comment: (NOTE) The eGFR has been calculated using the CKD EPI equation. This calculation has not been validated in all clinical situations. eGFR's persistently <60 mL/min signify possible Chronic Kidney Disease.    Anion gap 10 5 - 15  Sedimentation rate     Status: Abnormal   Collection Time: 07/05/16  6:17 PM  Result Value Ref Range   Sed Rate 31 (H) 0 - 22 mm/hr  C-reactive protein     Status: Abnormal   Collection Time: 07/05/16  6:17 PM  Result Value Ref Range   CRP 15.4 (H) <1.0 mg/dL  I-Stat CG4 Lactic Acid, ED     Status: None   Collection Time: 07/05/16  7:49 PM  Result Value Ref Range   Lactic Acid, Venous 0.73 0.5 - 1.9 mmol/L  Urinalysis, Routine w reflex microscopic     Status: Abnormal   Collection Time: 07/05/16  9:26 PM  Result Value Ref Range   Color, Urine STRAW (A) YELLOW   APPearance CLEAR CLEAR   Specific Gravity, Urine 1.010 1.005 - 1.030   pH 5.0 5.0 - 8.0   Glucose, UA NEGATIVE NEGATIVE mg/dL   Hgb urine dipstick SMALL (A) NEGATIVE   Bilirubin Urine NEGATIVE NEGATIVE   Ketones, ur NEGATIVE NEGATIVE mg/dL   Protein, ur NEGATIVE NEGATIVE mg/dL   Nitrite NEGATIVE NEGATIVE   Leukocytes, UA LARGE (A) NEGATIVE   RBC / HPF 0-5 0 - 5 RBC/hpf   WBC, UA TOO NUMEROUS TO COUNT 0 - 5 WBC/hpf   Bacteria, UA RARE (A) NONE SEEN   Squamous Epithelial / LPF 0-5 (A) NONE SEEN   Mucous PRESENT   I-Stat CG4 Lactic Acid, ED     Status: None   Collection Time: 07/05/16 10:19 PM  Result Value Ref Range   Lactic Acid, Venous 0.96 0.5 - 1.9 mmol/L  Glucose, capillary     Status: None   Collection Time: 07/06/16 12:43 AM  Result Value Ref Range   Glucose-Capillary 85 65 - 99 mg/dL  Influenza panel by PCR (type A & B)     Status: None   Collection Time: 07/06/16 12:55 AM  Result Value Ref Range   Influenza A By  PCR NEGATIVE NEGATIVE   Influenza B By PCR NEGATIVE NEGATIVE    Comment: (NOTE) The Xpert Xpress Flu assay is intended as an aid in the diagnosis of  influenza and should not be used as a sole basis for treatment.  This  assay is FDA approved for nasopharyngeal swab specimens only. Nasal  washings and aspirates are unacceptable for Xpert Xpress Flu testing.     Dg Chest 2 View  Result Date: 07/05/2016 CLINICAL DATA:  Postop fever for 3 days. Status post cervical spine fusion. EXAM: CHEST  2 VIEW COMPARISON:  12/13/2015 FINDINGS: Low lung volumes noted with mild bibasilar atelectasis. No evidence of pulmonary consolidation or pleural effusion. Heart size and mediastinal contours are within normal limits. Right shoulder prosthesis and cervical spine fusion hardware again noted. IMPRESSION: Low lung volumes with mild bibasilar atelectasis. Electronically Signed   By: Earle Gell M.D.   On: 07/05/2016 18:49    ROS 14 point updated, fever to 99 yesterday. Trace serous drainage , husband changing dressing twice a day. Was not red and no purulent drainage.  Past Psych Hx sees Dr. Toy Care.  Fibromyalgia, cervical DDD.  Blood pressure 126/70, pulse 87, temperature 99.5 F (37.5 C), temperature source Oral, resp. rate  17, height 5' 4" (1.626 m), weight 155 lb (70.3 kg), SpO2 98 %. Physical Exam  Constitutional: She appears well-developed and well-nourished.  Patient yelling, screaming at nurses. Rapid movement in bed.   Eyes: Conjunctivae are normal. Pupils are equal, round, and reactive to light.  Neck: Normal range of motion.  Posterior inferior neck incision inferior  Has erythema and slight purulent drainage with squeezing. Culture obtained.   Cardiovascular: Regular rhythm.   Respiratory: Effort normal and breath sounds normal.  GI: Soft. Bowel sounds are normal.  Skin: No rash noted. There is erythema. No pallor.  Psychiatric:  Manic, yelling she is in pain and no one believes her.      Assessment/Plan: Post op cervical posterior fusion with early post op superficial infection in patient who handles pain poorly, past Hx of narcotic pain management. Have posted for washout of superficial infection . Culture done today. PCR ordered. Recently neg, 7 months ago positive for MSSA.  Vanc and  Zosyn.  NPO after MN. Surgery in AM, dilaudid ordered.   Marybelle Killings 07/06/2016, 7:57 AM

## 2016-07-08 DIAGNOSIS — F1120 Opioid dependence, uncomplicated: Secondary | ICD-10-CM

## 2016-07-08 DIAGNOSIS — F329 Major depressive disorder, single episode, unspecified: Secondary | ICD-10-CM

## 2016-07-08 DIAGNOSIS — F4310 Post-traumatic stress disorder, unspecified: Secondary | ICD-10-CM

## 2016-07-08 LAB — CBC WITH DIFFERENTIAL/PLATELET
Basophils Absolute: 0 10*3/uL (ref 0.0–0.1)
Basophils Relative: 0 %
EOS PCT: 4 %
Eosinophils Absolute: 0.3 10*3/uL (ref 0.0–0.7)
HCT: 29.7 % — ABNORMAL LOW (ref 36.0–46.0)
HEMOGLOBIN: 9.5 g/dL — AB (ref 12.0–15.0)
Lymphocytes Relative: 24 %
Lymphs Abs: 1.8 10*3/uL (ref 0.7–4.0)
MCH: 28.3 pg (ref 26.0–34.0)
MCHC: 32 g/dL (ref 30.0–36.0)
MCV: 88.4 fL (ref 78.0–100.0)
MONOS PCT: 9 %
Monocytes Absolute: 0.6 10*3/uL (ref 0.1–1.0)
Neutro Abs: 4.7 10*3/uL (ref 1.7–7.7)
Neutrophils Relative %: 63 %
PLATELETS: 336 10*3/uL (ref 150–400)
RBC: 3.36 MIL/uL — ABNORMAL LOW (ref 3.87–5.11)
RDW: 13.5 % (ref 11.5–15.5)
WBC: 7.4 10*3/uL (ref 4.0–10.5)

## 2016-07-08 LAB — BASIC METABOLIC PANEL
ANION GAP: 10 (ref 5–15)
BUN: 5 mg/dL — AB (ref 6–20)
CHLORIDE: 101 mmol/L (ref 101–111)
CO2: 25 mmol/L (ref 22–32)
Calcium: 9.1 mg/dL (ref 8.9–10.3)
Creatinine, Ser: 0.86 mg/dL (ref 0.44–1.00)
GFR calc Af Amer: >60 mL/min (ref >60)
GLUCOSE: 112 mg/dL — AB (ref 65–99)
POTASSIUM: 3.8 mmol/L (ref 3.5–5.1)
SODIUM: 136 mmol/L (ref 135–145)

## 2016-07-08 LAB — AEROBIC CULTURE W GRAM STAIN (SUPERFICIAL SPECIMEN)

## 2016-07-08 LAB — GLUCOSE, CAPILLARY
GLUCOSE-CAPILLARY: 158 mg/dL — AB (ref 65–99)
GLUCOSE-CAPILLARY: 93 mg/dL (ref 65–99)
Glucose-Capillary: 100 mg/dL — ABNORMAL HIGH (ref 65–99)
Glucose-Capillary: 146 mg/dL — ABNORMAL HIGH (ref 65–99)
Glucose-Capillary: 127 mg/dL — ABNORMAL HIGH (ref 65–99)

## 2016-07-08 LAB — MAGNESIUM: MAGNESIUM: 2.1 mg/dL (ref 1.7–2.4)

## 2016-07-08 LAB — C-REACTIVE PROTEIN: CRP: 13.3 mg/dL — ABNORMAL HIGH (ref <1.0)

## 2016-07-08 LAB — AEROBIC CULTURE  (SUPERFICIAL SPECIMEN)

## 2016-07-08 MED ORDER — DIPHENHYDRAMINE-ZINC ACETATE 2-0.1 % EX CREA
TOPICAL_CREAM | Freq: Two times a day (BID) | CUTANEOUS | Status: DC | PRN
  Filled 2016-07-08: qty 28

## 2016-07-08 MED ORDER — AMPHETAMINE-DEXTROAMPHET ER 10 MG PO CP24
60.0000 mg | ORAL_CAPSULE | Freq: Every day | ORAL | Status: DC
  Administered 2016-07-08 – 2016-07-09 (×2): 60 mg via ORAL
  Filled 2016-07-08 (×2): qty 6

## 2016-07-08 MED ORDER — HYDROMORPHONE HCL 1 MG/ML IJ SOLN
1.0000 mg | INTRAMUSCULAR | Status: DC | PRN
  Administered 2016-07-08 – 2016-07-09 (×5): 1 mg via INTRAVENOUS
  Filled 2016-07-08 (×5): qty 1

## 2016-07-08 MED ORDER — HYDROMORPHONE HCL 1 MG/ML IJ SOLN
0.5000 mg | INTRAMUSCULAR | Status: DC | PRN
  Administered 2016-07-08 (×3): 0.5 mg via INTRAVENOUS
  Filled 2016-07-08 (×3): qty 1

## 2016-07-08 MED ORDER — HALOPERIDOL LACTATE 5 MG/ML IJ SOLN
2.0000 mg | Freq: Four times a day (QID) | INTRAMUSCULAR | Status: DC | PRN
  Administered 2016-07-09: 2 mg via INTRAVENOUS
  Filled 2016-07-08 (×2): qty 1

## 2016-07-08 MED ORDER — HALOPERIDOL LACTATE 5 MG/ML IJ SOLN: 5.0000 mg | Freq: Once | INTRAMUSCULAR | Status: DC

## 2016-07-08 NOTE — Progress Notes (Signed)
I informed Dr. Hanley Ben regarding Adderal according to the pt she is taking 30 mg 3x/day,he's on Pschy consult.

## 2016-07-08 NOTE — Plan of Care (Signed)
Notified by unit and Dr. Louis Meckel that patient confused/combative - yelling and having hallucinations - out of the room. Security notified and escorted patient back to room. Patient had removed IV and wound vac. Husband contacted and came to hospital.   On my evaluation patient had now calmed. Resting quietly with husband in room. IV and wound vac had been replaced. Vital signs stable.  No medical intervention at this time. Will continue to monitor closely. Orthopaedic evaluation of wound in AM. Also consider psychiatric evaluation as not clear what precipitated event.

## 2016-07-08 NOTE — Progress Notes (Signed)
Patient ID: Kara [name], female   DOB: 25-Dec-1955, 61 y.o.   MRN: [id number] No acute changes overnight.  Post I&D of the cervical spine yesterday by Dr. Ophelia Charter.  VAC in place with good seal.  Moving both upper and lower extremities.  Vitals stable.  WBC down to normal.  Continue VAC and antibiotics.

## 2016-07-08 NOTE — Consult Note (Signed)
[facility] Consult   Reason for Consult:  Agitation, hallucinations Referring Physician:  Dr. Starla Link Patient Identification: Kara [name] MRN:  [id number] Principal Diagnosis: Wound infection Diagnosis:   Patient Active Problem List   Diagnosis Date Noted  . Post-operative infection [T81.4XXA] 07/06/2016  . SIRS (systemic inflammatory response syndrome) ([place]) [R65.10] 07/05/2016  . Wound infection [T14.8XXA, L08.9] 07/05/2016  . Pyuria [N39.0] 07/05/2016  . Cervical pseudoarthrosis ([place]) [S12.9XXA] 06/21/2016  . Opioid dependence in remission ([place]) [F11.21] 06/14/2016  . Mitral valve prolapse [I34.1] 01/04/2016  . Skin infection [L08.9] 12/22/2015  . History of recent hospitalization [Z92.89] 12/22/2015  . Altered mental status [R41.82] 12/13/2015  . HNP (herniated nucleus pulposus), lumbar [M51.26] 12/10/2015  . Prediabetes [R73.03] 08/19/2015  . BMI 26.0-26.9,adult [Z68.26] 08/18/2015  . Chronic pain [G89.29] 08/18/2015  . Vitamin D deficiency [E55.9] 08/18/2015  . Atypical chest pain [R07.89] 08/18/2015  . Elevated BP [IMO0001] 08/18/2015  . Chronic fatigue disorder [R53.82] 06/14/2015  . Nail fungus [B35.1] 06/14/2015  . S/P cervical spinal fusion [Z98.1] 03/15/2015  . Spinal surgery in prior 3 months [Z98.890] 07/03/2014  . PTSD (post-traumatic stress disorder) [F43.10] 03/07/2014  . Severe recurrent major depression without psychotic features ([place]) [F33.2] 03/06/2014  . Suicide threat or attempt [R45.851] 03/06/2014  . Agitation [R45.1] 03/06/2014  . Aggressive behavior [R45.89]   . Family history of malignant neoplasm of breast [Z80.3]   . Rheumatoid arthritis ([employer]) [M06.9] 12/13/2011  . Fibromyalgia [M79.7] 12/13/2011    Total Time spent with patient: 30 minutes  Subjective:   Kara [name] is a 61 y.o. female patient with PTSD, MDD, opioid dependence, fibromyalgia, RA, who recently had cervical fusion of C5-C7 spinous process wiringdone on 28 th of  March 2018 presented with worsening pain, fever and drainage from the wound site. Psychiatry is consulted for agitation.   Per team, patient was combative, had hallucination, running in the hallway, sitting in the floor, and attempted to pull her wound vac, which was difficult to redirect.   Patient states that she felt scared last night, thinking that the staffs were trying to take out her organs. She believes that it was combination of her pain medication. She denies any paranoia today and feels better. She feels "happy" that she will be graduating from college soon, majoring human services. She is hoping to get a job. She was given adderal a little before this interview and finds it to be very helpful to do her homework (she was working on it with her computer before this Probation officer comes in). She talks about the concern of her daughter, age 77 who is a "cutter" and has mental health issues. She lives with her husband who is supportive for her and denies any concern at home.   She denies depressed mood. She usually sleeps well. She denies SI, HI, AH/VH. She feels anxious and takes Xanax prn. She denies paranoia. She denies decreased need for sleep or euphoria. She denies alcohol use or drug use. She has not taken fluoxetine before coming to the hospital.   Per [place],  Patient has been on Xanax 1 mg QID, DEXTROAMP- AMPHET ER 30 MG CAP 30 mg DEXTROAMP- AMPHET 30 MG CAP 30 mg prescribed by Dr. Toy Care RUPINDE and  OXYCODONE- ACETAMINOPHEN 5- 325 by Dr. Rodell Perna C MD No apparent data to suggest being over scribed of her medication  Past Psychiatric History:  Outpatient: Sees Dr. Toy Care RUPINDE every couple of months Psychiatry admission: 12/11-02/2014 at [facility] for MDD, PTSD Previous  suicide attempt: in 1998 by overdosing Xanax Past trials of medication: duloxetine, quetiapine, clonazepam History of violence: she was combative yesterday   Risk to Self: Is patient at risk for suicide?: No Risk to  Others:   Prior Inpatient Therapy:   Prior Outpatient Therapy:    Past Medical History:  Past Medical History:  Diagnosis Date  . ADD (attention deficit disorder)    on Adderal  . Aggressive behavior of adult   . Allergy   . Anxiety   . Arthritis   . Asthma    as a child  . Cellulitis of breast 11/2013   RIGHT BREAST  . Chronic fatigue and immune dysfunction syndrome ([place])   . Chronic kidney disease    due to infection from stone- no regular visits- fine now  . Chronic pain    goes to [facility] for pain control  . Cold sore   . 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 Depression    takes meds daily  . Family history of malignant neoplasm of breast   . Fibromyalgia   . Nephrolithiasis   . Osteoporosis   . Other specified rheumatoid arthritis, right shoulder ([place]) 08/01/2011  . Post-nasal drip    hx of  . Rheumatoid arthritis(714.0)    autoimmune arthritis; methotrexate once/week  . Urinary incontinence     Past Surgical History:  Procedure Laterality Date  . ANTERIOR CERVICAL DECOMP/DISCECTOMY FUSION N/A 03/15/2015   Procedure: Cervical five-six, Cerival six-seven, Anterior Cervical Discectomy and Fusion, Allograft and Plate;  Surgeon: Marybelle Killings, MD;  Location: [facility];  Service: Orthopedics;  Laterality: N/A;  . BACK SURGERY  2011,16   lower back fusion l4-5, s1  . BLADDER SURGERY  2009  . BREAST ENHANCEMENT SURGERY  2003  . breast implants removed    . BREAST SURGERY  10/2013   REMOVAL OF BREAST IMPLANTS  . INCISION AND DRAINAGE ABSCESS Right 01/16/2014   Procedure: INCISION AND DRAINAGE AND OF RIGHT BREAST ABCESS;  Surgeon: Excell Seltzer, MD;  Location: [facility];  Service: General;  Laterality: Right;  . JOINT REPLACEMENT    . LIPOSUCTION  2003   abdominal  . LUMBAR LAMINECTOMY/DECOMPRESSION MICRODISCECTOMY N/A 12/10/2015   Procedure: Right L3-4 Hemilaminectomy, Excision of herniated nucleus pulposus;  Surgeon:  Marybelle Killings, MD;  Location: [facility];  Service: Orthopedics;  Laterality: N/A;  . MASS EXCISION  11/03/2011   Procedure: MINOR EXCISION OF MASS;  Surgeon: Cammie Sickle., MD;  Location: [facility];  Service: Orthopedics;  Laterality: Left;  debride IP joint, cyst excision left index  . POSTERIOR CERVICAL FUSION/FORAMINOTOMY N/A 06/21/2016   Procedure: POSTERIOR CERVICAL FUSION C5-C7 SPINOUS PROCESS WIRING;  Surgeon: Marybelle Killings, MD;  Location: [facility];  Service: Orthopedics;  Laterality: N/A;  . POSTERIOR FUSION CERVICAL SPINE  06/21/2016   C5 C7  . SHOULDER SURGERY  2012   right  . TONSILLECTOMY  1987  . TOTAL SHOULDER ARTHROPLASTY  08/01/2011   Procedure: TOTAL SHOULDER ARTHROPLASTY;  Surgeon: Johnny Bridge, MD;  Location: [facility];  Service: Orthopedics;  Laterality: Right;  Right total shoulder arthroplasty  . TUBAL LIGATION  1988   Family History:  Family History  Problem Relation Age of Onset  . Arthritis Mother   . Heart disease Mother     ?psvt  . Breast cancer Mother 91    TAH/BSO  . Cancer Mother   .  Mental illness Mother   . COPD Father   . Hypertension Father   . Alcohol abuse Father   . Mental illness Father   . Heart disease Father   . Healthy Daughter   . Breast cancer Maternal Aunt 27    deceased  . Cancer Cousin 48    female; unknown primary  . Colon cancer Paternal Aunt 42    deceased at 49  . Stomach cancer Paternal Uncle 17    deceased at 7  . Alcohol abuse Brother   . Cancer Brother   . Hodgkin's lymphoma Brother   . Mental illness Brother   . HIV Brother   . Healthy Brother   . Healthy Son    Family Psychiatric  History: denies Social History:  History  Alcohol Use No    Comment: denied alcohol     History  Drug Use No    Comment: denied any drug use with admission nurse    Social History   Social History  . Marital status: Married    Spouse name: N/A  . Number of children: N/A  . Years of education: N/A   Social  History Main Topics  . Smoking status: Former Smoker    Packs/day: 0.50    Years: 10.00    Types: Cigarettes    Quit date: 12/06/2005  . Smokeless tobacco: Never Used     Comment: smoked for 10 years  . Alcohol use No     Comment: denied alcohol  . Drug use: No     Comment: denied any drug use with admission nurse  . Sexual activity: Yes    Birth control/ protection: Post-menopausal   Other Topics Concern  . None   Social History Narrative   Married, Loretto Belinsky.    6 children.    [employer], Retired Education officer, museum.    Denies alcohol, tobacco or drug use.   Drinks caffeinated beverages. Uses herbal remedies. Take a daily vitamin.   Wears her seatbelt. Exercises greater than 3 times a week.   Smoke detector in the home, firearms in the home in a locked cabinet, feels safe in her relationships.   Additional Social History:  Lives with her husband, has four children, divorced once  Allergies:   Allergies  Allergen Reactions  . Aspirin Anaphylaxis  . Ibuprofen Anaphylaxis  . Ativan [Lorazepam] Other (See Comments)    Makes agitated, combative   . Ketamine Other (See Comments)    Hallucinations  . Toradol [Ketorolac Tromethamine] Itching, Swelling and Rash    UNSPECIFIED REACTION   . Tramadol Hcl Itching, Swelling and Rash    SWELLING REACTION UNSPECIFIED  Tolerates Dilaudid 06/2016.  TDD.    Labs:  Results for orders placed or performed during the hospital encounter of 07/05/16 (from the past 48 hour(s))  Glucose, capillary     Status: Abnormal   Collection Time: 07/06/16  8:02 PM  Result Value Ref Range   Glucose-Capillary 162 (H) 65 - 99 mg/dL  Glucose, capillary     Status: Abnormal   Collection Time: 07/07/16 12:06 AM  Result Value Ref Range   Glucose-Capillary 116 (H) 65 - 99 mg/dL  Glucose, capillary     Status: Abnormal   Collection Time: 07/07/16  3:35 AM  Result Value Ref Range   Glucose-Capillary 103 (H) 65 - 99 mg/dL  CBC with Differential/Platelet     Status:  Abnormal   Collection Time: 07/07/16  5:13 AM  Result Value Ref Range   WBC  10.8 (H) 4.0 - 10.5 K/uL   RBC 3.77 (L) 3.87 - 5.11 MIL/uL   Hemoglobin 10.4 (L) 12.0 - 15.0 g/dL   HCT 33.4 (L) 36.0 - 46.0 %   MCV 88.6 78.0 - 100.0 fL   MCH 27.6 26.0 - 34.0 pg   MCHC 31.1 30.0 - 36.0 g/dL   RDW 13.5 11.5 - 15.5 %   Platelets 298 150 - 400 K/uL   Neutrophils Relative % 68 %   Neutro Abs 7.4 1.7 - 7.7 K/uL   Lymphocytes Relative 17 %   Lymphs Abs 1.8 0.7 - 4.0 K/uL   Monocytes Relative 13 %   Monocytes Absolute 1.4 (H) 0.1 - 1.0 K/uL   Eosinophils Relative 2 %   Eosinophils Absolute 0.2 0.0 - 0.7 K/uL   Basophils Relative 0 %   Basophils Absolute 0.0 0.0 - 0.1 K/uL  Basic metabolic panel     Status: Abnormal   Collection Time: 07/07/16  5:13 AM  Result Value Ref Range   Sodium 138 135 - 145 mmol/L   Potassium 4.3 3.5 - 5.1 mmol/L   Chloride 106 101 - 111 mmol/L   CO2 25 22 - 32 mmol/L   Glucose, Bld 102 (H) 65 - 99 mg/dL   BUN 6 6 - 20 mg/dL   Creatinine, Ser 0.74 0.44 - 1.00 mg/dL   Calcium 8.7 (L) 8.9 - 10.3 mg/dL   GFR calc non Af Amer >60 >60 mL/min   GFR calc Af Amer >60 >60 mL/min    Comment: (NOTE) The eGFR has been calculated using the CKD EPI equation. This calculation has not been validated in all clinical situations. eGFR's persistently <60 mL/min signify possible Chronic Kidney Disease.    Anion gap 7 5 - 15  C-reactive protein     Status: Abnormal   Collection Time: 07/07/16  5:13 AM  Result Value Ref Range   CRP 19.3 (H) <1.0 mg/dL  Aerobic/Anaerobic Culture (surgical/deep wound)     Status: None (Preliminary result)   Collection Time: 07/07/16  8:09 AM  Result Value Ref Range   Specimen Description WOUND NECK    Special Requests POSTERIOR    Gram Stain      RARE WBC PRESENT, PREDOMINANTLY PMN RARE GRAM POSITIVE COCCI IN PAIRS    Culture      MODERATE STAPHYLOCOCCUS AUREUS SUSCEPTIBILITIES PERFORMED ON PREVIOUS CULTURE WITHIN THE LAST 5 DAYS.     Report Status PENDING   Glucose, capillary     Status: Abnormal   Collection Time: 07/07/16 11:44 AM  Result Value Ref Range   Glucose-Capillary 159 (H) 65 - 99 mg/dL   Comment 1 Notify RN   Glucose, capillary     Status: None   Collection Time: 07/07/16  4:05 PM  Result Value Ref Range   Glucose-Capillary 90 65 - 99 mg/dL   Comment 1 Notify RN   Glucose, capillary     Status: None   Collection Time: 07/07/16  8:54 PM  Result Value Ref Range   Glucose-Capillary 93 65 - 99 mg/dL  Glucose, capillary     Status: Abnormal   Collection Time: 07/08/16 12:08 AM  Result Value Ref Range   Glucose-Capillary 158 (H) 65 - 99 mg/dL  CBC with Differential/Platelet     Status: Abnormal   Collection Time: 07/08/16  3:37 AM  Result Value Ref Range   WBC 7.4 4.0 - 10.5 K/uL   RBC 3.36 (L) 3.87 - 5.11 MIL/uL   Hemoglobin 9.5 (L)  12.0 - 15.0 g/dL   HCT 29.7 (L) 36.0 - 46.0 %   MCV 88.4 78.0 - 100.0 fL   MCH 28.3 26.0 - 34.0 pg   MCHC 32.0 30.0 - 36.0 g/dL   RDW 13.5 11.5 - 15.5 %   Platelets 336 150 - 400 K/uL   Neutrophils Relative % 63 %   Neutro Abs 4.7 1.7 - 7.7 K/uL   Lymphocytes Relative 24 %   Lymphs Abs 1.8 0.7 - 4.0 K/uL   Monocytes Relative 9 %   Monocytes Absolute 0.6 0.1 - 1.0 K/uL   Eosinophils Relative 4 %   Eosinophils Absolute 0.3 0.0 - 0.7 K/uL   Basophils Relative 0 %   Basophils Absolute 0.0 0.0 - 0.1 K/uL  Basic metabolic panel     Status: Abnormal   Collection Time: 07/08/16  3:37 AM  Result Value Ref Range   Sodium 136 135 - 145 mmol/L   Potassium 3.8 3.5 - 5.1 mmol/L   Chloride 101 101 - 111 mmol/L   CO2 25 22 - 32 mmol/L   Glucose, Bld 112 (H) 65 - 99 mg/dL   BUN 5 (L) 6 - 20 mg/dL   Creatinine, Ser 0.86 0.44 - 1.00 mg/dL   Calcium 9.1 8.9 - 10.3 mg/dL   GFR calc non Af Amer >60 >60 mL/min   GFR calc Af Amer >60 >60 mL/min    Comment: (NOTE) The eGFR has been calculated using the CKD EPI equation. This calculation has not been validated in all clinical  situations. eGFR's persistently <60 mL/min signify possible Chronic Kidney Disease.    Anion gap 10 5 - 15  Magnesium     Status: None   Collection Time: 07/08/16  3:37 AM  Result Value Ref Range   Magnesium 2.1 1.7 - 2.4 mg/dL  C-reactive protein     Status: Abnormal   Collection Time: 07/08/16  3:37 AM  Result Value Ref Range   CRP 13.3 (H) <1.0 mg/dL  Glucose, capillary     Status: Abnormal   Collection Time: 07/08/16  4:13 AM  Result Value Ref Range   Glucose-Capillary 100 (H) 65 - 99 mg/dL  Glucose, capillary     Status: None   Collection Time: 07/08/16  7:37 AM  Result Value Ref Range   Glucose-Capillary 93 65 - 99 mg/dL  Glucose, capillary     Status: Abnormal   Collection Time: 07/08/16 12:33 PM  Result Value Ref Range   Glucose-Capillary 146 (H) 65 - 99 mg/dL    Current Facility-Administered Medications  Medication Dose Route Frequency Provider Last Rate Last Dose  . acetaminophen (TYLENOL) tablet 650 mg  650 mg Oral Q6H PRN Toy Baker, MD   650 mg at 07/07/16 2044   Or  . acetaminophen (TYLENOL) suppository 650 mg  650 mg Rectal Q6H PRN Toy Baker, MD      . ALPRAZolam Duanne Moron) tablet 1 mg  1 mg Oral TID PRN Toy Baker, MD   1 mg at 07/08/16 1023  . Chlorhexidine Gluconate Cloth 2 % PADS 6 each  6 each Topical Daily Aline August, MD   6 each at 07/08/16 1035  . diphenhydrAMINE-zinc acetate (BENADRYL) 2-0.1 % cream   Topical BID PRN Kshitiz Alekh, MD      . FLUoxetine (PROZAC) capsule 10 mg  10 mg Oral Daily Toy Baker, MD   10 mg at 07/08/16 1023  . haloperidol lactate (HALDOL) injection 2 mg  2 mg Intravenous Q6H PRN Kshitiz Alekh,  MD      . HYDROmorphone (DILAUDID) injection 1 mg  1 mg Intravenous Q2H PRN Kshitiz Alekh, MD      . insulin aspart (novoLOG) injection 0-9 Units  0-9 Units Subcutaneous Q4H Toy Baker, MD   1 Units at 07/08/16 1238  . methocarbamol (ROBAXIN) tablet 500 mg  500 mg Oral Q6H PRN Toy Baker, MD    500 mg at 07/08/16 1330  . metoCLOPramide (REGLAN) tablet 5-10 mg  5-10 mg Oral Q8H PRN Marybelle Killings, MD       Or  . metoCLOPramide (REGLAN) injection 5-10 mg  5-10 mg Intravenous Q8H PRN Marybelle Killings, MD      . mupirocin ointment (BACTROBAN) 2 % 1 application  1 application Nasal BID Aline August, MD   1 application at 27/25/36 1026  . ondansetron (ZOFRAN) tablet 4 mg  4 mg Oral Q6H PRN Toy Baker, MD       Or  . ondansetron (ZOFRAN) injection 4 mg  4 mg Intravenous Q6H PRN Toy Baker, MD      . oxyCODONE (Oxy IR/ROXICODONE) immediate release tablet 5-10 mg  5-10 mg Oral Q3H PRN Mcarthur Rossetti, MD   10 mg at 07/08/16 1211  . piperacillin-tazobactam (ZOSYN) IVPB 3.375 g  3.375 g Intravenous Q8H Veronda P Bryk, RPH   3.375 g at 07/08/16 1212  . vancomycin (VANCOCIN) IVPB 1000 mg/200 mL premix  1,000 mg Intravenous Q12H Priscella Mann, RPH   1,000 mg at 07/08/16 1029  . venlafaxine XR (EFFEXOR-XR) 24 hr capsule 150 mg  150 mg Oral Q breakfast Toy Baker, MD   150 mg at 07/08/16 1023  . zolpidem (AMBIEN) tablet 5 mg  5 mg Oral QHS PRN Gardiner Barefoot, NP   5 mg at 07/06/16 2322    Musculoskeletal: Strength & Muscle Tone: within normal limits Gait & Station: normal Patient leans: N/A  Psychiatric Specialty Exam: Physical Exam  Review of Systems  Musculoskeletal: Positive for back pain and neck pain.  Psychiatric/Behavioral: Negative for depression, hallucinations, substance abuse and suicidal ideas. The patient is not nervous/anxious and does not have insomnia.   All other systems reviewed and are negative.   Blood pressure 125/74, pulse 75, temperature 98.3 F (36.8 C), temperature source Oral, resp. rate 18, height '5\' 4"'  (1.626 m), weight 155 lb (70.3 kg), SpO2 96 %.Body mass index is 26.61 kg/m.  General Appearance: Fairly Groomed  Eye Contact:  Good  Speech:  Clear and Coherent  Volume:  Normal  Mood:  "fine"  Affect:  Appropriate, Congruent  and Full Range  Thought Process:  Coherent and Goal Directed  Orientation:  Full (Time, Place, and Person)  Thought Content:  Logical Perceptions: denies AH/VH  Suicidal Thoughts:  No  Homicidal Thoughts:  No  Memory:  Immediate;   Good Recent;   Good Remote;   Good  Judgement:  Good  Insight:  Fair  Psychomotor Activity:  Normal  Concentration:  Concentration: Good and Attention Span: Good  Recall:  Good  Fund of Knowledge:  Good  Language:  Good  Akathisia:  No  Handed:  Right  AIMS (if indicated):     Assets:  Communication Skills Desire for Improvement  ADL's:  Intact  Cognition:  WNL  Sleep:   good   Assessment Kara [name] is a 61 y.o. female patient with PTSD, MDD, opioid dependence, fibromyalgia, RA, who recently had cervical fusion of C5-C7 spinous process wiringdone on 28 th of March 2018  presented with worsening pain, fever and drainage from the wound site. Psychiatry is consulted for agitation.   # Delirium- resolved # MDD # PTSD She is calm, alert, oriented and demonstrates organized thought process on exam today. The episode of agitation, disorganization is consistent with delirium, likely secondary to opioid use. Would recommend minimal use of opioids to avoid worsening in her delirium, while treating her pain. No significant mood symptoms on exam today. Although it is preferable to switch Xanax to ativan (to avoid dependence) and to hold Adderall for worsening delirium/anxiety, she is not amenable to this option. Given these medication are prescribed by her psychiatrist, will defer further changes to her psychiatrist upon discharge. Of note, she states that she has discontinued Prozac before admission; would recommend to hold this medication to avoid polypharmacy.   Plan - Continue Effexor 150 mg daily - Hold Fluoxetine (patient discontinued this mediation) - Delirium precautions - Minimize/avoid deliriogenic meds including: anticholinergic, opiates,  benzodiazepines           - Maintain hydration, oxygenation, nutrition           - Limit use of restraints and catheters           - Normalize sleep patterns by minimizing nighttime noise, light and interruptions by                clustering care, opening blinds during the day           - Reorient the patient frequently, provide easily visible clock and calendar           - Provide sensory aids like glasses, hearing aids           - Encourage ambulation, regular activities and visitors to maintain cognitive stimulation  - Please contact psychiatry for any questions or need for re-evaluation. Patient is safe to be discharged from psychiatry stand point (unless she has another episode of agitation/disorganization).   Treatment Plan Summary: Plan as above  Disposition: Patient does not meet criteria for psychiatric inpatient admission.  Norman Clay, MD 07/08/2016 4:03 PM

## 2016-07-08 NOTE — Progress Notes (Signed)
Patient ID: Kara [name], female   DOB: 05/23/1955, 61 y.o.   MRN: [id number]  PROGRESS NOTE    Sumayah Bearse  WLS:[id number] DOB: 1955-08-30 DOA: 07/05/2016 PCP: Felix Pacini, DO   Brief Narrative:  61 y.o.femalewith medical history significant of neck pain, fibromyalgia, history of opioid dependence, rheumatoid arthritis who recently had cervical fusion of C5-C7 spinous process wiringdone on 28 th of March 2018 presented with worsening pain, fever and drainage from the wound site. She was admitted with probable wound infection, SIRS and started on iv antibiotics. She had debridement of the wound done by orthopedics on 07/07/16 and currently has a wound vac. She has been intermittently very agitated, combative, pulling out her wound vac and iv lines few times.   Assessment & Plan:   Principal Problem:   Wound infection Active Problems:   Fibromyalgia   S/P cervical spinal fusion   Chronic pain   Prediabetes   SIRS (systemic inflammatory response syndrome) (HCC)   Pyuria   Post-operative infection  1. Wound infection: of the recent cervical fusion wound - Status post debridement of the wound done today by orthopedics yesterday. Follow-up OR cultures. continue iv Vanc and Zosyn. Vanc dosing as per pharmacy. Patient currently has a wound VAC. Follow-up further recommend is from orthopedics. -pain management: will be careful with opiates as patient has history of narcotic dependence. Monitor mental status and hemodynamics.  -follow AM labs -PT eval  2. Leukocytosis: probably due to #1; improved. follow AM labs  3. SIRS: secondary to wound infection   4. Chronic pain- difficult to manage history of opioid dependence and drug-seeking behavior -cautious use of iv dilaudid and oxycodone orally  5. Fibromyalgia:continue home medications  6. Prediabetes: monitor blood sugars; cover with insulinsliding scale  7. Pyuria:  urine culture showing multiple species. Patient is  already on broad spectrum antibiotics, so will not order a repeat urine culture.  8. Extreme agitation, combativeness and hallucinations: Unclear etiology; psychiatric consultation called. We will wait for recommendations. When necessary, Haldol will be used.   DVT prophylaxis: SCDs; will start lovenox today Code Status: FULL CODE  Family Communication:  none present Disposition Plan:Home care vs SNF in 1-3 days  Consultants:  Orthopedics: Dr. Ophelia Charter  Procedures:Wound debridement done on 07/07/2016  Antimicrobials: Vanc & Zosyn 07/05/16>>    Subjective: Patient was seen and examined at bedside. She is currently calm and sleeping. As per the nursing report, she was very agitated and combative overnight and had pulled out her wound VAC and her intravenous line. No overnight fever or vomiting.  Objective: Vitals:   07/07/16 1355 07/07/16 2056 07/08/16 0119 07/08/16 0417  BP: (!) 92/58 118/63 103/66 95/62  Pulse: 72 84 73 60  Resp: 18 18 18 18   Temp: 98 F (36.7 C) (!) 100.4 F (38 C) 98 F (36.7 C) 98.5 F (36.9 C)  TempSrc: Oral Oral Oral Oral  SpO2: 97% 97% 98% 98%  Weight:      Height:        Intake/Output Summary (Last 24 hours) at 07/08/16 0949 Last data filed at 07/08/16 0418  Gross per 24 hour  Intake          1581.25 ml  Output             1050 ml  Net           531.25 ml   Filed Weights   07/05/16 1652 07/06/16 0033  Weight: 72.1 kg (159 lb) 70.3 kg (155 lb)  Examination: General exam:Appears calm and comfortable. Sleeping currently Respiratory system: Clear to auscultation. Respiratory effort normal. Cardiovascular system:S1 &S2 heard, RRR. Gastrointestinal system:Abdomen is nondistended, soft and nontender. No organomegaly or masses felt. Normal bowel sounds heard. Extremities: no cyanosis, clubbing, edema   Data Reviewed: I have personally reviewed following labs and imaging studies  CBC:  Recent Labs Lab 07/05/16 1817  07/07/16 0513 07/08/16 0337  WBC 20.0* 10.8* 7.4  NEUTROABS 17.3* 7.4 4.7  HGB 12.4 10.4* 9.5*  HCT 38.0 33.4* 29.7*  MCV 87.0 88.6 88.4  PLT 341 298 336   Basic Metabolic Panel:  Recent Labs Lab 07/05/16 1817 07/07/16 0513 07/08/16 0337  NA 134* 138 136  K 4.3 4.3 3.8  CL 102 106 101  CO2 22 25 25   GLUCOSE 116* 102* 112*  BUN 23* 6 5*  CREATININE 0.84 0.74 0.86  CALCIUM 9.1 8.7* 9.1  MG  --   --  2.1   GFR: Estimated Creatinine Clearance: 67.7 mL/min (by C-G formula based on SCr of 0.86 mg/dL). Liver Function Tests:  Recent Labs Lab 07/05/16 1817  AST 29  ALT 39  ALKPHOS 115  BILITOT 0.5  PROT 6.6  ALBUMIN 3.6   No results for input(s): LIPASE, AMYLASE in the last 168 hours. No results for input(s): AMMONIA in the last 168 hours. Coagulation Profile: No results for input(s): INR, PROTIME in the last 168 hours. Cardiac Enzymes: No results for input(s): CKTOTAL, CKMB, CKMBINDEX, TROPONINI in the last 168 hours. BNP (last 3 results) No results for input(s): PROBNP in the last 8760 hours. HbA1C: No results for input(s): HGBA1C in the last 72 hours. CBG:  Recent Labs Lab 07/07/16 1605 07/07/16 2054 07/08/16 0008 07/08/16 0413 07/08/16 0737  GLUCAP 90 93 158* 100* 93   Lipid Profile: No results for input(s): CHOL, HDL, LDLCALC, TRIG, CHOLHDL, LDLDIRECT in the last 72 hours. Thyroid Function Tests: No results for input(s): TSH, T4TOTAL, FREET4, T3FREE, THYROIDAB in the last 72 hours. Anemia Panel: No results for input(s): VITAMINB12, FOLATE, FERRITIN, TIBC, IRON, RETICCTPCT in the last 72 hours. Sepsis Labs:  Recent Labs Lab 07/05/16 1949 07/05/16 2219  LATICACIDVEN 0.73 0.96    Recent Results (from the past 240 hour(s))  Blood culture (routine x 2)     Status: None (Preliminary result)   Collection Time: 07/05/16  7:30 PM  Result Value Ref Range Status   Specimen Description BLOOD RIGHT ANTECUBITAL  Final   Special Requests   Final     BOTTLES DRAWN AEROBIC AND ANAEROBIC Blood Culture adequate volume   Culture NO GROWTH 2 DAYS  Final   Report Status PENDING  Incomplete  Blood culture (routine x 2)     Status: None (Preliminary result)   Collection Time: 07/05/16  7:40 PM  Result Value Ref Range Status   Specimen Description BLOOD LEFT ANTECUBITAL  Final   Special Requests BOTTLES DRAWN AEROBIC AND ANAEROBIC 5CC  Final   Culture NO GROWTH 2 DAYS  Final   Report Status PENDING  Incomplete  Urine culture     Status: Abnormal   Collection Time: 07/05/16  9:56 PM  Result Value Ref Range Status   Specimen Description URINE, CLEAN CATCH  Final   Special Requests NONE  Final   Culture MULTIPLE SPECIES PRESENT, SUGGEST RECOLLECTION (A)  Final   Report Status 07/07/2016 FINAL  Final  Respiratory Panel by PCR     Status: None   Collection Time: 07/06/16 12:31 AM  Result Value Ref Range  Status   Adenovirus NOT DETECTED NOT DETECTED Final   Coronavirus 229E NOT DETECTED NOT DETECTED Final   Coronavirus HKU1 NOT DETECTED NOT DETECTED Final   Coronavirus NL63 NOT DETECTED NOT DETECTED Final   Coronavirus OC43 NOT DETECTED NOT DETECTED Final   Metapneumovirus NOT DETECTED NOT DETECTED Final   Rhinovirus / Enterovirus NOT DETECTED NOT DETECTED Final   Influenza A NOT DETECTED NOT DETECTED Final   Influenza B NOT DETECTED NOT DETECTED Final   Parainfluenza Virus 1 NOT DETECTED NOT DETECTED Final   Parainfluenza Virus 2 NOT DETECTED NOT DETECTED Final   Parainfluenza Virus 3 NOT DETECTED NOT DETECTED Final   Parainfluenza Virus 4 NOT DETECTED NOT DETECTED Final   Respiratory Syncytial Virus NOT DETECTED NOT DETECTED Final   Bordetella pertussis NOT DETECTED NOT DETECTED Final   Chlamydophila pneumoniae NOT DETECTED NOT DETECTED Final   Mycoplasma pneumoniae NOT DETECTED NOT DETECTED Final  Surgical pcr screen     Status: Abnormal   Collection Time: 07/06/16  8:24 AM  Result Value Ref Range Status   MRSA, PCR NEGATIVE NEGATIVE  Final   Staphylococcus aureus POSITIVE (A) NEGATIVE Final    Comment:        The Xpert SA Assay (FDA approved for NASAL specimens in patients over 56 years of age), is one component of a comprehensive surveillance program.  Test performance has been validated by [facility] for patients greater than or equal to 73 year old. It is not intended to diagnose infection nor to guide or monitor treatment.   Aerobic Culture (superficial specimen)     Status: None   Collection Time: 07/06/16  8:29 AM  Result Value Ref Range Status   Specimen Description ABSCESS NECK  Final   Special Requests POSTERIOR NECK INCISION  Final   Gram Stain   Final    RARE WBC PRESENT, PREDOMINANTLY PMN RARE GRAM POSITIVE COCCI IN SINGLES    Culture FEW STAPHYLOCOCCUS AUREUS  Final   Report Status 07/08/2016 FINAL  Final   Organism ID, Bacteria STAPHYLOCOCCUS AUREUS  Final      Susceptibility   Staphylococcus aureus - MIC*    CIPROFLOXACIN <=0.5 SENSITIVE Sensitive     ERYTHROMYCIN <=0.25 SENSITIVE Sensitive     GENTAMICIN <=0.5 SENSITIVE Sensitive     OXACILLIN 0.5 SENSITIVE Sensitive     TETRACYCLINE <=1 SENSITIVE Sensitive     VANCOMYCIN <=0.5 SENSITIVE Sensitive     TRIMETH/SULFA <=10 SENSITIVE Sensitive     CLINDAMYCIN <=0.25 SENSITIVE Sensitive     RIFAMPIN <=0.5 SENSITIVE Sensitive     Inducible Clindamycin NEGATIVE Sensitive     * FEW STAPHYLOCOCCUS AUREUS  Aerobic/Anaerobic Culture (surgical/deep wound)     Status: None (Preliminary result)   Collection Time: 07/07/16  8:09 AM  Result Value Ref Range Status   Specimen Description WOUND NECK  Final   Special Requests POSTERIOR  Final   Gram Stain   Final    RARE WBC PRESENT, PREDOMINANTLY PMN RARE GRAM POSITIVE COCCI IN PAIRS    Culture PENDING  Incomplete   Report Status PENDING  Incomplete         Radiology Studies: No results found.      Scheduled Meds: . Chlorhexidine Gluconate Cloth  6 each Topical Daily  . FLUoxetine   10 mg Oral Daily  . insulin aspart  0-9 Units Subcutaneous Q4H  . mupirocin ointment  1 application Nasal BID  . piperacillin-tazobactam (ZOSYN)  IV  3.375 g Intravenous Q8H  .  vancomycin  1,000 mg Intravenous Q12H  . venlafaxine XR  150 mg Oral Q breakfast     Glade Lloyd, MD [facility] Pager [phone] If 7PM-7AM, please contact night-coverage [email or web address] Password [facility] 07/08/2016, 9:49 AM

## 2016-07-08 NOTE — Evaluation (Signed)
Occupational Therapy Evaluation Patient Details Name: Kara [name] MRN: [id number] DOB: Oct 26, 1955 Today's Date: 07/08/2016    History of Present Illness Pt is 61 y/o female presenting with surgical infection from C5-C7 fusion on 06/21/16. PMH includes fibromyalgia, RA, CKD, opiod dependence, and has had previous R shoulder and back surgeries.    Clinical Impression   Prior to C5-7 fusion on 06/21/16, pt was independent and living with her husband. Pt currently performing functional mobility and standing ADLs with Min guard for safety due to increased impulsivity. Pt required Mod A for LB ADLs due to cervical precautions. Pt verbalized confusion about reason for hospital admission but was oriented to self, place, and time. Pt would benefited from continued skilled OT to increase independence with ADLs. Recommend dc home once medically stable per physician.     Follow Up Recommendations  Supervision/Assistance - 24 hour;No OT follow up    Equipment Recommendations  None recommended by OT    Recommendations for Other Services       Precautions / Restrictions Precautions Precautions: Cervical Required Braces or Orthoses: Cervical Brace Cervical Brace: Soft collar Restrictions Weight Bearing Restrictions: No      Mobility Bed Mobility Overal bed mobility: Needs Assistance Bed Mobility: Sidelying to Sit   Sidelying to sit: Supervision       General bed mobility comments: VCs for head alignment to maintain cervical precautions  Transfers Overall transfer level: Needs assistance Equipment used: None Transfers: Sit to/from Stand Sit to Stand: Min guard              Balance Overall balance assessment: Needs assistance Sitting-balance support: No upper extremity supported;Feet supported Sitting balance-Leahy Scale: Fair     Standing balance support: No upper extremity supported;During functional activity Standing balance-Leahy Scale: Fair Standing balance comment: Pt  performed grooming at sink and functional mobility without need for UE support                           ADL either performed or assessed with clinical judgement   ADL Overall ADL's : Needs assistance/impaired Eating/Feeding: Set up;Sitting   Grooming: Oral care;Min guard;Standing   Upper Body Bathing: Sitting;Minimal assistance   Lower Body Bathing: Moderate assistance;Sit to/from stand   Upper Body Dressing : Minimal assistance;Sitting   Lower Body Dressing: Moderate assistance;Sit to/from stand   Toilet Transfer: Solicitor;Ambulation           Functional mobility during ADLs: Min guard General ADL Comments: Pt demonstrated good balance during functional mobility and grooming tasks. Need to address LB ADLs and further educate on cervical precautions.      Vision Baseline Vision/History: Wears glasses Wears Glasses: Reading only Patient Visual Report: No change from baseline       Perception     Praxis      Pertinent Vitals/Pain Pain Assessment: Faces Faces Pain Scale: Hurts little more Pain Location: neck  Pain Descriptors / Indicators: Constant;Grimacing;Guarding;Discomfort Pain Intervention(s): Monitored during session     Hand Dominance Right   Extremity/Trunk Assessment Upper Extremity Assessment Upper Extremity Assessment: Overall WFL for tasks assessed   Lower Extremity Assessment Lower Extremity Assessment: Generalized weakness   Cervical / Trunk Assessment Cervical / Trunk Assessment: Other exceptions Cervical / Trunk Exceptions: s/p sugery on 3/28 and now presenting with surgical infection   Communication Communication Communication: No difficulties   Cognition Arousal/Alertness: Awake/alert Behavior During Therapy: [facility] for tasks assessed/performed;Impulsive Overall Cognitive Status: History of cognitive impairments -  at baseline                                 General Comments: Pt was slightly  impulsive and distactable. Required VCs to navigate with wound vac   General Comments  Pt voiced concerns about pain medication. Communicated pt's concern with RN    Exercises     Shoulder Instructions      Home Living Family/patient expects to be discharged to:: Private residence Living Arrangements: Spouse/significant other Available Help at Discharge: Family;Available 24 hours/day Type of Home: House Home Access: Level entry     Home Layout: One level     Bathroom Shower/Tub: Producer, television/film/video: Standard     Home Equipment: None          Prior Functioning/Environment Level of Independence: Independent                 OT Problem List: Decreased strength;Decreased activity tolerance;Decreased safety awareness;Decreased knowledge of use of DME or AE;Decreased knowledge of precautions;Pain      OT Treatment/Interventions: Self-care/ADL training;Therapeutic exercise;Energy conservation;DME and/or AE instruction;Therapeutic activities;Patient/family education    OT Goals(Current goals can be found in the care plan section) Acute Rehab OT Goals Patient Stated Goal: Go home OT Goal Formulation: With patient Time For Goal Achievement: 07/22/16 Potential to Achieve Goals: Good ADL Goals Pt Will Perform Lower Body Bathing: with min guard assist;sit to/from stand;with adaptive equipment Pt Will Perform Lower Body Dressing: with min guard assist;with adaptive equipment;sit to/from stand  OT Frequency: Min 2X/week   Barriers to D/C:            Co-evaluation              End of Session Equipment Utilized During Treatment: Cervical collar Nurse Communication: Mobility status;Patient requests pain meds;Precautions  Activity Tolerance: Patient tolerated treatment well Patient left: in chair;with call bell/phone within reach;with chair alarm set  OT Visit Diagnosis: Unsteadiness on feet (R26.81);Muscle weakness (generalized) (M62.81);Pain Pain  - part of body:  (Neck)                Time: 1157-2620 OT Time Calculation (min): 20 min Charges:  OT General Charges $OT Visit: 1 Procedure OT Evaluation $OT Eval Low Complexity: 1 Procedure G-Codes:     Ean Gettel, OTR/L [phone]  Theodoro Grist Jaquavion Mccannon 07/08/2016, 1:28 PM

## 2016-07-08 NOTE — Progress Notes (Signed)
Patient removed wound vac dressing, Rn replaced dressing seal intact.

## 2016-07-08 NOTE — Progress Notes (Signed)
2:45 Patient removed IV and wound vac and left room, patient confused wandering in hall  stated " why did you change my room" patient escorted back to room, was able to get her to calm down for a moment then she stated she had to use the restroom, tried to show her the one in her room refused to use that stated she wanted to use the one down the hall, patient is agitated and screaming "don't touch me." Patient runs out of the room and runs down the hall screaming. Security call by charge nurse, Team follows the patient and continues to try to talk her down attempted to escort patient back to room patient sit down in floor security came and escorted patient back to bed, MD notified stated he would call the code MD.Husband called, Rn reassured  patient,security outside patient calms down allowed nurse to put wound vac dressing back on (Neck incision is red and enlarged),  husband came, patient allowed IV team to place new IV. Patient calm down and resting. Order was given for haldol not need at this time( discontinued). Code MD on unit advised of situation No new orders given

## 2016-07-09 LAB — GLUCOSE, CAPILLARY
GLUCOSE-CAPILLARY: 84 mg/dL (ref 65–99)
GLUCOSE-CAPILLARY: 93 mg/dL (ref 65–99)
GLUCOSE-CAPILLARY: 97 mg/dL (ref 65–99)
Glucose-Capillary: 93 mg/dL (ref 65–99)
Glucose-Capillary: 134 mg/dL — ABNORMAL HIGH (ref 65–99)

## 2016-07-09 LAB — BASIC METABOLIC PANEL
ANION GAP: 10 (ref 5–15)
BUN: 5 mg/dL — AB (ref 6–20)
CHLORIDE: 103 mmol/L (ref 101–111)
CO2: 26 mmol/L (ref 22–32)
Calcium: 9.2 mg/dL (ref 8.9–10.3)
Creatinine, Ser: 1.16 mg/dL — ABNORMAL HIGH (ref 0.44–1.00)
GFR calc Af Amer: 59 mL/min — ABNORMAL LOW (ref >60)
GFR, EST NON AFRICAN AMERICAN: 50 mL/min — AB (ref >60)
Glucose, Bld: 101 mg/dL — ABNORMAL HIGH (ref 65–99)
POTASSIUM: 3.6 mmol/L (ref 3.5–5.1)
SODIUM: 139 mmol/L (ref 135–145)

## 2016-07-09 LAB — CBC WITH DIFFERENTIAL/PLATELET
BASOS ABS: 0 10*3/uL (ref 0.0–0.1)
Basophils Relative: 0 %
EOS ABS: 0.4 10*3/uL (ref 0.0–0.7)
Eosinophils Relative: 5 %
HCT: 30.3 % — ABNORMAL LOW (ref 36.0–46.0)
HEMOGLOBIN: 9.6 g/dL — AB (ref 12.0–15.0)
LYMPHS ABS: 1.5 10*3/uL (ref 0.7–4.0)
LYMPHS PCT: 20 %
MCH: 27.3 pg (ref 26.0–34.0)
MCHC: 31.7 g/dL (ref 30.0–36.0)
MCV: 86.1 fL (ref 78.0–100.0)
Monocytes Absolute: 0.9 10*3/uL (ref 0.1–1.0)
Monocytes Relative: 13 %
NEUTROS PCT: 62 %
Neutro Abs: 4.6 10*3/uL (ref 1.7–7.7)
Platelets: 369 10*3/uL (ref 150–400)
RBC: 3.52 MIL/uL — AB (ref 3.87–5.11)
RDW: 12.8 % (ref 11.5–15.5)
WBC: 7.3 10*3/uL (ref 4.0–10.5)

## 2016-07-09 LAB — C-REACTIVE PROTEIN: CRP: 7.6 mg/dL — ABNORMAL HIGH (ref <1.0)

## 2016-07-09 MED ORDER — AMPHETAMINE-DEXTROAMPHET ER 10 MG PO CP24
60.0000 mg | ORAL_CAPSULE | Freq: Every day | ORAL | Status: DC
  Administered 2016-07-09 – 2016-07-10 (×2): 60 mg via ORAL
  Filled 2016-07-09: qty 6

## 2016-07-09 MED ORDER — CEFAZOLIN SODIUM-DEXTROSE 2-4 GM/100ML-% IV SOLN
2.0000 g | Freq: Three times a day (TID) | INTRAVENOUS | Status: DC
  Administered 2016-07-09 – 2016-07-10 (×4): 2 g via INTRAVENOUS
  Filled 2016-07-09 (×5): qty 100

## 2016-07-09 MED ORDER — ENOXAPARIN SODIUM 40 MG/0.4ML ~~LOC~~ SOLN
40.0000 mg | Freq: Every day | SUBCUTANEOUS | Status: DC
  Filled 2016-07-09: qty 0.4

## 2016-07-09 NOTE — Progress Notes (Signed)
Patient verbalizing multiple demands, stating she wants to go home and "get some real rest".  She states "no one understands me and how much pain I am having".  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  She is shouting expletives at the hospitalist and states she wants the wound vac off so she can "safely" go home.  Patient has unrealistic expectations and states she has "lots of anti-biotics at home" she can take that will take care of the infection just as well as what we are giving her.  I cautioned her against this practice, stating this may do actually more harm than good.  Staff is attempting to get in touch with Dr. Magnus Ivan, who is covering for Dr. Ophelia Charter.  She is adamant that she wants to go home, and I have informed her that we cannot give her any prescriptions should she decide to go AMA.

## 2016-07-09 NOTE — Progress Notes (Signed)
Patient ID: Kara [name], female   DOB: [birth year], 61 y.o.   MRN: [id number]  PROGRESS NOTE    Larcenia Holaday  ZSM:[id number] DOB: [birth year] DOA: 07/05/2016 PCP: Felix Pacini, DO   Brief Narrative:  61 y.o.femalewith medical history significant of neck pain, fibromyalgia, history of opioid dependence, rheumatoid arthritis who recently had cervical fusion of C5-C7 spinous process wiringdone on 28 th of March 2018 presented with worsening pain, fever and drainage from the wound site. She was admitted with probable wound infection, SIRS and started on iv antibiotics. She had debridement of the wound done by orthopedics on 07/07/16 and currently has a wound vac. She has been intermittently very agitated, combative, pulling out her wound vac and iv lines few times. She has been evaluated by psychiatry as well.   Assessment & Plan:   Principal Problem:   Wound infection Active Problems:   Fibromyalgia   S/P cervical spinal fusion   Chronic pain   Prediabetes   SIRS (systemic inflammatory response syndrome) (HCC)   Pyuria   Post-operative infection  1. Wound infection: of the recent cervical fusion wound - Status post debridement of the wound done today by orthopedics on 07/07/16; currently with a wound vac. OR cultures growing MSSA. Discontinue  Vanc and Zosyn. Start Ancef.  Follow-up further recommend is from orthopedics. -pain management: will be careful with opiates as patient has history of narcotic dependence. Monitor mental status and hemodynamics. -follow AM labs -PT eval  2. Leukocytosis: probably due to #1; improved.  3. SIRS: secondary to wound infection   4. Chronic pain- difficult to manage history of opioid dependence and drug-seeking behavior - Discontinue Dilaudid; Use oxycodone PRN.  5. Fibromyalgia:continue home medications  6. Prediabetes: monitor blood sugars; cover with insulinsliding scale  7. Pyuria: urine culture showing multiple species. Patient  is already on broad spectrum antibiotics, so will not order a repeat urine culture.  8. Extreme agitation, combativeness and hallucinations:  questionable cause; may be related to delirium vs pain medication seeking behavior. Psychiatry consult appreciated. DC Dilaudid and Fluoxetine as per psych recommendations. Patient will have to follow up with outpatient psychiatry regarding other med changes.  DVT prophylaxis: SCDs; lovenox Code Status: FULL CODE  Family Communication: none present Disposition Plan:Home care vs SNF once cleared by orthopedics Consultants:  Orthopedics: Dr. Karle Starch  Procedures:Wound debridement done on 07/07/2016  Antimicrobials: Vanc & Zosyn 07/05/16>>   Subjective: Patient was seen and examined at bedside. She gets extremely agitated and loud intermittently. She is ok for the dilaudid to be stopped. No overnight fever or vomiting.  Objective: Vitals:   07/08/16 1050 07/08/16 1441 07/08/16 2042 07/09/16 0455  BP: (!) 93/58 125/74 104/68 102/64  Pulse: 69 75 72 74  Resp: 17 18 17 17   Temp: 98.6 F (37 C) 98.3 F (36.8 C) 98.2 F (36.8 C) 98.3 F (36.8 C)  TempSrc: Oral Oral Oral Oral  SpO2: 91% 96% 95% 96%  Weight:      Height:        Intake/Output Summary (Last 24 hours) at 07/09/16 1252 Last data filed at 07/09/16 0856  Gross per 24 hour  Intake             1082 ml  Output             3200 ml  Net            -2118 ml   Filed Weights   07/05/16 1652 07/06/16 0033  Weight: 72.1 kg (159  lb) 70.3 kg (155 lb)    Examination:  General exam: gets intermittently agitated and loud; alert and awake Respiratory system: Clear to auscultation. Respiratory effort normal. Cardiovascular system:S1 &S2 heard, RRR. Gastrointestinal system:Abdomen is nondistended, soft and nontender. No organomegaly or masses felt. Normal bowel sounds heard. Extremities: no cyanosis, clubbing, edema  Data Reviewed: I have personally reviewed  following labs and imaging studies  CBC:  Recent Labs Lab 07/05/16 1817 07/07/16 0513 07/08/16 0337 07/09/16 0435  WBC 20.0* 10.8* 7.4 7.3  NEUTROABS 17.3* 7.4 4.7 4.6  HGB 12.4 10.4* 9.5* 9.6*  HCT 38.0 33.4* 29.7* 30.3*  MCV 87.0 88.6 88.4 86.1  PLT 341 298 336 369   Basic Metabolic Panel:  Recent Labs Lab 07/05/16 1817 07/07/16 0513 07/08/16 0337 07/09/16 0435  NA 134* 138 136 139  K 4.3 4.3 3.8 3.6  CL 102 106 101 103  CO2 22 25 25 26   GLUCOSE 116* 102* 112* 101*  BUN 23* 6 5* 5*  CREATININE 0.84 0.74 0.86 1.16*  CALCIUM 9.1 8.7* 9.1 9.2  MG  --   --  2.1  --    GFR: Estimated Creatinine Clearance: 50.2 mL/min (A) (by C-G formula based on SCr of 1.16 mg/dL (H)). Liver Function Tests:  Recent Labs Lab 07/05/16 1817  AST 29  ALT 39  ALKPHOS 115  BILITOT 0.5  PROT 6.6  ALBUMIN 3.6   No results for input(s): LIPASE, AMYLASE in the last 168 hours. No results for input(s): AMMONIA in the last 168 hours. Coagulation Profile: No results for input(s): INR, PROTIME in the last 168 hours. Cardiac Enzymes: No results for input(s): CKTOTAL, CKMB, CKMBINDEX, TROPONINI in the last 168 hours. BNP (last 3 results) No results for input(s): PROBNP in the last 8760 hours. HbA1C: No results for input(s): HGBA1C in the last 72 hours. CBG:  Recent Labs Lab 07/08/16 2036 07/09/16 0022 07/09/16 0451 07/09/16 0811 07/09/16 1235  GLUCAP 127* 84 97 93 134*   Lipid Profile: No results for input(s): CHOL, HDL, LDLCALC, TRIG, CHOLHDL, LDLDIRECT in the last 72 hours. Thyroid Function Tests: No results for input(s): TSH, T4TOTAL, FREET4, T3FREE, THYROIDAB in the last 72 hours. Anemia Panel: No results for input(s): VITAMINB12, FOLATE, FERRITIN, TIBC, IRON, RETICCTPCT in the last 72 hours. Sepsis Labs:  Recent Labs Lab 07/05/16 1949 07/05/16 2219  LATICACIDVEN 0.73 0.96    Recent Results (from the past 240 hour(s))  Blood culture (routine x 2)     Status: None  (Preliminary result)   Collection Time: 07/05/16  7:30 PM  Result Value Ref Range Status   Specimen Description BLOOD RIGHT ANTECUBITAL  Final   Special Requests   Final    BOTTLES DRAWN AEROBIC AND ANAEROBIC Blood Culture adequate volume   Culture NO GROWTH 3 DAYS  Final   Report Status PENDING  Incomplete  Blood culture (routine x 2)     Status: None (Preliminary result)   Collection Time: 07/05/16  7:40 PM  Result Value Ref Range Status   Specimen Description BLOOD LEFT ANTECUBITAL  Final   Special Requests BOTTLES DRAWN AEROBIC AND ANAEROBIC 5CC  Final   Culture NO GROWTH 3 DAYS  Final   Report Status PENDING  Incomplete  Urine culture     Status: Abnormal   Collection Time: 07/05/16  9:56 PM  Result Value Ref Range Status   Specimen Description URINE, CLEAN CATCH  Final   Special Requests NONE  Final   Culture MULTIPLE SPECIES PRESENT, SUGGEST RECOLLECTION (  A)  Final   Report Status 07/07/2016 FINAL  Final  Respiratory Panel by PCR     Status: None   Collection Time: 07/06/16 12:31 AM  Result Value Ref Range Status   Adenovirus NOT DETECTED NOT DETECTED Final   Coronavirus 229E NOT DETECTED NOT DETECTED Final   Coronavirus HKU1 NOT DETECTED NOT DETECTED Final   Coronavirus NL63 NOT DETECTED NOT DETECTED Final   Coronavirus OC43 NOT DETECTED NOT DETECTED Final   Metapneumovirus NOT DETECTED NOT DETECTED Final   Rhinovirus / Enterovirus NOT DETECTED NOT DETECTED Final   Influenza A NOT DETECTED NOT DETECTED Final   Influenza B NOT DETECTED NOT DETECTED Final   Parainfluenza Virus 1 NOT DETECTED NOT DETECTED Final   Parainfluenza Virus 2 NOT DETECTED NOT DETECTED Final   Parainfluenza Virus 3 NOT DETECTED NOT DETECTED Final   Parainfluenza Virus 4 NOT DETECTED NOT DETECTED Final   Respiratory Syncytial Virus NOT DETECTED NOT DETECTED Final   Bordetella pertussis NOT DETECTED NOT DETECTED Final   Chlamydophila pneumoniae NOT DETECTED NOT DETECTED Final   Mycoplasma  pneumoniae NOT DETECTED NOT DETECTED Final  Surgical pcr screen     Status: Abnormal   Collection Time: 07/06/16  8:24 AM  Result Value Ref Range Status   MRSA, PCR NEGATIVE NEGATIVE Final   Staphylococcus aureus POSITIVE (A) NEGATIVE Final    Comment:        The Xpert SA Assay (FDA approved for NASAL specimens in patients over 56 years of age), is one component of a comprehensive surveillance program.  Test performance has been validated by [facility] for patients greater than or equal to 73 year old. It is not intended to diagnose infection nor to guide or monitor treatment.   Aerobic Culture (superficial specimen)     Status: None   Collection Time: 07/06/16  8:29 AM  Result Value Ref Range Status   Specimen Description ABSCESS NECK  Final   Special Requests POSTERIOR NECK INCISION  Final   Gram Stain   Final    RARE WBC PRESENT, PREDOMINANTLY PMN RARE GRAM POSITIVE COCCI IN SINGLES    Culture FEW STAPHYLOCOCCUS AUREUS  Final   Report Status 07/08/2016 FINAL  Final   Organism ID, Bacteria STAPHYLOCOCCUS AUREUS  Final      Susceptibility   Staphylococcus aureus - MIC*    CIPROFLOXACIN <=0.5 SENSITIVE Sensitive     ERYTHROMYCIN <=0.25 SENSITIVE Sensitive     GENTAMICIN <=0.5 SENSITIVE Sensitive     OXACILLIN 0.5 SENSITIVE Sensitive     TETRACYCLINE <=1 SENSITIVE Sensitive     VANCOMYCIN <=0.5 SENSITIVE Sensitive     TRIMETH/SULFA <=10 SENSITIVE Sensitive     CLINDAMYCIN <=0.25 SENSITIVE Sensitive     RIFAMPIN <=0.5 SENSITIVE Sensitive     Inducible Clindamycin NEGATIVE Sensitive     * FEW STAPHYLOCOCCUS AUREUS  Aerobic/Anaerobic Culture (surgical/deep wound)     Status: None (Preliminary result)   Collection Time: 07/07/16  8:09 AM  Result Value Ref Range Status   Specimen Description WOUND NECK  Final   Special Requests POSTERIOR  Final   Gram Stain   Final    RARE WBC PRESENT, PREDOMINANTLY PMN RARE GRAM POSITIVE COCCI IN PAIRS    Culture   Final    MODERATE  STAPHYLOCOCCUS AUREUS SUSCEPTIBILITIES PERFORMED ON PREVIOUS CULTURE WITHIN THE LAST 5 DAYS.    Report Status PENDING  Incomplete         Radiology Studies: No results found.  Scheduled Meds: . amphetamine-dextroamphetamine  60 mg Oral Daily  .  ceFAZolin (ANCEF) IV  2 g Intravenous Q8H  . Chlorhexidine Gluconate Cloth  6 each Topical Daily  . insulin aspart  0-9 Units Subcutaneous Q4H  . mupirocin ointment  1 application Nasal BID  . venlafaxine XR  150 mg Oral Q breakfast   Continuous Infusions:   LOS: 3 days      Glade Lloyd, MD [facility] Pager [phone]  If 7PM-7AM, please contact night-coverage [email or web address] Password [facility] 07/09/2016, 12:52 PM

## 2016-07-09 NOTE — Progress Notes (Signed)
Pharmacy Antibiotic Note  Kara [name] is a 61 y.o. female admitted on 07/05/2016 with neck abscess.  Pharmacy has been consulted for Vancomycin and Zosyn dosing.   Cultures back with MSSA.  Will narrow antibiotics.  Plan: After discussion with Dr. Hanley Ben, antibiotics will be changed Ancef 2g IV every 8 hours. . High dose selected due to recent spinal surgery and risk of vertebral involvement.  Monitor renal function, clinical status, and culture results.   Height: 5\' 4"  (162.6 cm) Weight: 155 lb (70.3 kg) IBW/kg (Calculated) : 54.7  Temp (24hrs), Avg:98.3 F (36.8 C), Min:98.2 F (36.8 C), Max:98.3 F (36.8 C)   Recent Labs Lab 07/05/16 1817 07/05/16 1949 07/05/16 2219 07/07/16 0513 07/08/16 0337 07/09/16 0435  WBC 20.0*  --   --  10.8* 7.4 7.3  CREATININE 0.84  --   --  0.74 0.86 1.16*  LATICACIDVEN  --  0.73 0.96  --   --   --     Estimated Creatinine Clearance: 50.2 mL/min (A) (by C-G formula based on SCr of 1.16 mg/dL (H)).    Allergies  Allergen Reactions  . Aspirin Anaphylaxis  . Ibuprofen Anaphylaxis  . Ativan [Lorazepam] Other (See Comments)    Makes agitated, combative   . Ketamine Other (See Comments)    Hallucinations  . Toradol [Ketorolac Tromethamine] Itching, Swelling and Rash    UNSPECIFIED REACTION   . Tramadol Hcl Itching, Swelling and Rash    SWELLING REACTION UNSPECIFIED  Tolerates Dilaudid 06/2016.  TDD.    Antimicrobials this admission: Vanc 4/11 >>4/15 Zosyn 4/11 >>4/15 Ancef 4/15 >>   Dose adjustments this admission: 4/13: Increased vancomycin to 1g IV q12h due to sight of infection.    Microbiology results: 4/12 Resp panel - negative 4/12 Neck abscess - MSSA 4/13 Neck abscess -MSSA 4/11 Blood x2 >> 4/11 Urine >> multiple species, none predominant  Thank you for allowing pharmacy to be a part of this patient's care.  6/11, PharmD, BCPS Clinical Pharmacist Clinical phone 07/09/2016 until 3:30 PM - [phone] After hours,  please call [phone] 07/09/2016 12:25 PM

## 2016-07-09 NOTE — Progress Notes (Signed)
Patient ID: Kara [name], female   DOB: 12-Sep-1955, 61 y.o.   MRN: [id number] No acute changes.  vitlas are stable.  VAC with good seal.  Cultures showing moderate staph with final cultures pending.  Continue VAC and IV antibiotics.

## 2016-07-09 NOTE — Progress Notes (Signed)
Patient having multiple complaints and wanting to go outside to "smoke cannabis".  Very agitated at this time and pulling at her wound vac and IV lines.  Is upset that Dilaudid has been discontinued, but medicated with Xanax and oxy.  Attempts to reassure patient are futile at this time, despite presence of husband.  She states she wants to go home and "rest in my bed and take the antibiotics I have at home".  Attempted to give Haldol, but this seems to have opposite affect.  States she feels she is "jumping out of her skin".  Calming slowly.  De-escalization methods semi-effective.

## 2016-07-10 ENCOUNTER — Telehealth (INDEPENDENT_AMBULATORY_CARE_PROVIDER_SITE_OTHER): Payer: Self-pay | Admitting: Radiology

## 2016-07-10 ENCOUNTER — Telehealth (INDEPENDENT_AMBULATORY_CARE_PROVIDER_SITE_OTHER): Payer: Self-pay | Admitting: *Deleted

## 2016-07-10 ENCOUNTER — Encounter (HOSPITAL_COMMUNITY): Payer: Self-pay | Admitting: Orthopaedic Surgery

## 2016-07-10 DIAGNOSIS — Z807 Family history of other malignant neoplasms of lymphoid, hematopoietic and related tissues: Secondary | ICD-10-CM

## 2016-07-10 DIAGNOSIS — Z8261 Family history of arthritis: Secondary | ICD-10-CM

## 2016-07-10 DIAGNOSIS — Z7189 Other specified counseling: Secondary | ICD-10-CM

## 2016-07-10 DIAGNOSIS — Z800 Family history of malignant neoplasm of digestive organs: Secondary | ICD-10-CM

## 2016-07-10 DIAGNOSIS — Z811 Family history of alcohol abuse and dependence: Secondary | ICD-10-CM

## 2016-07-10 DIAGNOSIS — Z515 Encounter for palliative care: Secondary | ICD-10-CM

## 2016-07-10 DIAGNOSIS — Z884 Allergy status to anesthetic agent status: Secondary | ICD-10-CM

## 2016-07-10 DIAGNOSIS — Z9689 Presence of other specified functional implants: Secondary | ICD-10-CM

## 2016-07-10 DIAGNOSIS — M069 Rheumatoid arthritis, unspecified: Secondary | ICD-10-CM

## 2016-07-10 DIAGNOSIS — Z8249 Family history of ischemic heart disease and other diseases of the circulatory system: Secondary | ICD-10-CM

## 2016-07-10 DIAGNOSIS — Z888 Allergy status to other drugs, medicaments and biological substances status: Secondary | ICD-10-CM

## 2016-07-10 DIAGNOSIS — Z981 Arthrodesis status: Secondary | ICD-10-CM

## 2016-07-10 DIAGNOSIS — Z818 Family history of other mental and behavioral disorders: Secondary | ICD-10-CM

## 2016-07-10 DIAGNOSIS — Y838 Other surgical procedures as the cause of abnormal reaction of the patient, or of later complication, without mention of misadventure at the time of the procedure: Secondary | ICD-10-CM

## 2016-07-10 DIAGNOSIS — Z87891 Personal history of nicotine dependence: Secondary | ICD-10-CM

## 2016-07-10 DIAGNOSIS — M4622 Osteomyelitis of vertebra, cervical region: Secondary | ICD-10-CM

## 2016-07-10 DIAGNOSIS — Z831 Family history of other infectious and parasitic diseases: Secondary | ICD-10-CM

## 2016-07-10 DIAGNOSIS — Z886 Allergy status to analgesic agent status: Secondary | ICD-10-CM

## 2016-07-10 DIAGNOSIS — Z885 Allergy status to narcotic agent status: Secondary | ICD-10-CM

## 2016-07-10 DIAGNOSIS — Z803 Family history of malignant neoplasm of breast: Secondary | ICD-10-CM

## 2016-07-10 DIAGNOSIS — Z825 Family history of asthma and other chronic lower respiratory diseases: Secondary | ICD-10-CM

## 2016-07-10 DIAGNOSIS — B9561 Methicillin susceptible Staphylococcus aureus infection as the cause of diseases classified elsewhere: Secondary | ICD-10-CM

## 2016-07-10 DIAGNOSIS — N179 Acute kidney failure, unspecified: Secondary | ICD-10-CM

## 2016-07-10 LAB — CULTURE, BLOOD (ROUTINE X 2)
CULTURE: NO GROWTH
Culture: NO GROWTH
SPECIAL REQUESTS: ADEQUATE

## 2016-07-10 LAB — CBC WITH DIFFERENTIAL/PLATELET
BASOS ABS: 0 10*3/uL (ref 0.0–0.1)
BASOS PCT: 0 %
EOS ABS: 0.3 10*3/uL (ref 0.0–0.7)
EOS PCT: 4 %
HCT: 35.1 % — ABNORMAL LOW (ref 36.0–46.0)
Hemoglobin: 11.2 g/dL — ABNORMAL LOW (ref 12.0–15.0)
Lymphocytes Relative: 24 %
Lymphs Abs: 1.7 10*3/uL (ref 0.7–4.0)
MCH: 27.5 pg (ref 26.0–34.0)
MCHC: 31.9 g/dL (ref 30.0–36.0)
MCV: 86 fL (ref 78.0–100.0)
MONO ABS: 0.5 10*3/uL (ref 0.1–1.0)
MONOS PCT: 7 %
NEUTROS ABS: 4.5 10*3/uL (ref 1.7–7.7)
Neutrophils Relative %: 65 %
PLATELETS: 490 10*3/uL — AB (ref 150–400)
RBC: 4.08 MIL/uL (ref 3.87–5.11)
RDW: 12.9 % (ref 11.5–15.5)
WBC: 6.9 10*3/uL (ref 4.0–10.5)

## 2016-07-10 LAB — GLUCOSE, CAPILLARY
GLUCOSE-CAPILLARY: 89 mg/dL (ref 65–99)
GLUCOSE-CAPILLARY: 170 mg/dL — AB (ref 65–99)
GLUCOSE-CAPILLARY: 82 mg/dL (ref 65–99)

## 2016-07-10 LAB — BASIC METABOLIC PANEL
ANION GAP: 12 (ref 5–15)
BUN: <5 mg/dL — ABNORMAL LOW (ref 6–20)
CALCIUM: 9.1 mg/dL (ref 8.9–10.3)
CO2: 27 mmol/L (ref 22–32)
CREATININE: 1.24 mg/dL — AB (ref 0.44–1.00)
Chloride: 101 mmol/L (ref 101–111)
GFR, EST AFRICAN AMERICAN: 54 mL/min — AB (ref >60)
GFR, EST NON AFRICAN AMERICAN: 47 mL/min — AB (ref >60)
Glucose, Bld: 145 mg/dL — ABNORMAL HIGH (ref 65–99)
Potassium: 3.3 mmol/L — ABNORMAL LOW (ref 3.5–5.1)
SODIUM: 140 mmol/L (ref 135–145)

## 2016-07-10 LAB — MAGNESIUM: Magnesium: 2.2 mg/dL (ref 1.7–2.4)

## 2016-07-10 MED ORDER — SODIUM CHLORIDE 0.9% FLUSH
10.0000 mL | INTRAVENOUS | Status: DC | PRN
  Administered 2016-07-10: 10 mL
  Filled 2016-07-10: qty 40

## 2016-07-10 MED ORDER — HEPARIN SOD (PORK) LOCK FLUSH 100 UNIT/ML IV SOLN
250.0000 [IU] | INTRAVENOUS | Status: AC | PRN
  Administered 2016-07-10: 250 [IU]

## 2016-07-10 MED ORDER — HYDROMORPHONE HCL 1 MG/ML IJ SOLN
0.5000 mg | Freq: Four times a day (QID) | INTRAMUSCULAR | Status: DC | PRN
  Administered 2016-07-10: 0.5 mg via INTRAVENOUS
  Filled 2016-07-10: qty 1

## 2016-07-10 MED ORDER — OXYCODONE-ACETAMINOPHEN 10-325 MG PO TABS: 1.0000 | ORAL_TABLET | Freq: Four times a day (QID) | ORAL | 0 refills | Status: DC | PRN

## 2016-07-10 MED ORDER — RIFAMPIN 300 MG PO CAPS
300.0000 mg | ORAL_CAPSULE | Freq: Every day | ORAL | Status: DC
  Administered 2016-07-10: 300 mg via ORAL
  Filled 2016-07-10: qty 1

## 2016-07-10 MED ORDER — OXYCODONE-ACETAMINOPHEN 5-325 MG PO TABS: 1.0000 | ORAL_TABLET | Freq: Four times a day (QID) | ORAL | 0 refills | Status: DC | PRN

## 2016-07-10 MED ORDER — RIFAMPIN 300 MG PO CAPS: 300.0000 mg | ORAL_CAPSULE | Freq: Every day | ORAL | 0 refills | Status: DC

## 2016-07-10 MED ORDER — CEFAZOLIN IV (FOR PTA / DISCHARGE USE ONLY): 2.0000 g | Freq: Three times a day (TID) | INTRAVENOUS | 0 refills | Status: DC

## 2016-07-10 NOTE — Discharge Summary (Signed)
Physician Discharge Summary  Kara [name] VCB:[id number] DOB: [birth year] DOA: 07/05/2016  PCP: Howard Pouch, DO  Admit date: 07/05/2016 Discharge date: 07/10/2016  Admitted From: home Disposition:  Home  Recommendations for Outpatient Follow-up:  1. Follow up with PCP in 1-2 weeks 2. Please obtain CMP/CBC in weekly while on antibiotics 3. Follow-up with Dr. Lorin Mercy as scheduled 4. Follow-up with infectious diseases in 4-5 weeks' time  Home Health: Yes Equipment/Devices: Wound VAC  Discharge Condition: Stable CODE STATUS: Full Diet recommendation: Heart Healthy  Brief/Interim Summary: 61 y.o.femalewith medical history significant of neck pain, fibromyalgia, history of opioid dependence, rheumatoid arthritis who recently had cervical fusion of C5-C7 spinous process wiringdone on 28 th of March 2018 presented with worsening pain, fever and drainage from the wound site. She was admitted with probable wound infection, SIRS and started on iv antibiotics. She had debridement of the wound done by orthopedics on 07/07/16 and currently has a wound vac. She has been intermittently very agitated, combative, pulling out her wound vac and iv lines few times. She has been evaluated by psychiatry as well. Home wound vac arrangement has been done. PICC line was placed today. She will be discharged home on antibiotics as per ID recommendations.  Discharge Diagnoses:  Principal Problem:   Wound infection Active Problems:   Fibromyalgia   S/P cervical spinal fusion   Chronic pain   Prediabetes   SIRS (systemic inflammatory response syndrome) (HCC)   Pyuria   Post-operative infection  1. Wound infection/Acute ostomyelitis: of the recent cervical fusion wound - Status post debridement of the wound done today by orthopedics on 07/07/16; currently with a wound vac. OR cultures growing MSSA.  - Discharged home on IV Ancef and oral rifampin for 6 weeks as per infectious disease evaluation and  recommendations. Monitor CBC and CMP weekly while on antibiotics - Follow up with Dr. Lorin Mercy and infectious diseases as scheduled. Wound care as per Dr. Narda Amber recommendation  2. Leukocytosis: probably due to #1; improved.  3. SIRS: secondary to wound infection   4. Chronic pain- difficult to manage history of opioid dependence and drug-seeking behavior -  Outpatient pain management follow up.  5. Fibromyalgia:continue home medications  6. Prediabetes: Outpatient follow-up  7. Pyuria: urine culture showing multiple species.   8. Extreme agitation, combativeness and hallucinations:questionable cause; may be related to delirium vs pain medication seeking behavior. Psychiatry consult appreciated. Psychiatry recommends to minimize opiates and anxiolytics. Patient will have to follow up with outpatient psychiatry regarding other med changes.  Discharge Instructions  Discharge Instructions    Call MD for:  difficulty breathing, headache or visual disturbances    Complete by:  As directed    Call MD for:  hives    Complete by:  As directed    Call MD for:  persistant nausea and vomiting    Complete by:  As directed    Call MD for:  redness, tenderness, or signs of infection (pain, swelling, redness, odor or green/yellow discharge around incision site)    Complete by:  As directed    Call MD for:  severe uncontrolled pain    Complete by:  As directed    Call MD for:  temperature >100.4    Complete by:  As directed    Diet - low sodium heart healthy    Complete by:  As directed    Discharge instructions    Complete by:  As directed    Wound care as per Dr. Narda Amber recommendations  Home infusion instructions [facility] May follow [place] Dosing Protocol; May administer Cathflo as needed to maintain patency of vascular access device.; Flushing of vascular access device: per [facility] Protocol: 0.9% NaCl pre/post medica...    Complete by:  As directed     Instructions:  May follow [place] Dosing Protocol   Instructions:  May administer Cathflo as needed to maintain patency of vascular access device.   Instructions:  Flushing of vascular access device: per [facility] Protocol: 0.9% NaCl pre/post medication administration and prn patency; Heparin 100 u/ml, 31m for implanted ports and Heparin 10u/ml, 566mfor all other central venous catheters.   Instructions:  May follow [facility] Anaphylaxis Protocol for First Dose Administration in the home: 0.9% NaCl at 25-50 ml/hr to maintain IV access for protocol meds. Epinephrine 0.3 ml IV/IM PRN and Benadryl 25-50 IV/IM PRN s/s of anaphylaxis.   Instructions:  [place] Coordinator (RN) to assist per patient IV care needs in the home PRN.   Increase activity slowly    Complete by:  As directed      Allergies as of 07/10/2016      Reactions   Aspirin Anaphylaxis   Ibuprofen Anaphylaxis   Ativan [lorazepam] Other (See Comments)   Makes agitated, combative   Ketamine Other (See Comments)   Hallucinations   Toradol [ketorolac Tromethamine] Itching, Swelling, Rash   UNSPECIFIED REACTION    Tramadol Hcl Itching, Swelling, Rash   SWELLING REACTION UNSPECIFIED  Tolerates Dilaudid 06/2016.  TDD.      Medication List    STOP taking these medications   FLUoxetine 10 MG capsule Commonly known as:  PROZAC   oxyCODONE 5 MG immediate release tablet Commonly known as:  Oxy IR/ROXICODONE   oxyCODONE-acetaminophen 5-325 MG tablet Commonly known as:  ROXICET Replaced by:  oxyCODONE-acetaminophen 10-325 MG tablet   valACYclovir 1000 MG tablet Commonly known as:  VALTREX     TAKE these medications   ACTEMRA 162 MG/0.9ML Sosy Generic drug:  Tocilizumab Has stopped prior to procedure   ADDERALL XR 30 MG 24 hr capsule Generic drug:  amphetamine-dextroamphetamine Take 60 mg by mouth daily. Takes 2 capsules in the morning   amphetamine-dextroamphetamine 30 MG tablet Commonly known as:   ADDERALL Take 30 mg by mouth daily. Before 4 pm   ALPRAZolam 1 MG tablet Commonly known as:  XANAX Take 1 mg by mouth 3 (three) times daily as needed for anxiety.   ceFAZolin IVPB Commonly known as:  ANCEF Inject 2 g into the vein every 8 (eight) hours. Indication:  Wound infection Last Day of Therapy:  08/16/16 Labs - Once weekly:  CBC/D and BMP, Labs - Every other week:  ESR and CRP   cyclobenzaprine 10 MG tablet Commonly known as:  FLEXERIL TAKE 1 TABLET (10 MG TOTAL) BY MOUTH 3 (THREE) TIMES DAILY AS NEEDED FOR MUSCLE SPASMS.   diclofenac 75 MG EC tablet Commonly known as:  VOLTAREN Take 1 tablet (75 mg total) by mouth 2 (two) times daily.   Diclofenac Sodium 2 % Soln Commonly known as:  PENNSAID Place 2 g onto the skin 4 (four) times daily as needed. What changed:  when to take this  reasons to take this   methocarbamol 500 MG tablet Commonly known as:  ROBAXIN Take 1 tablet (500 mg total) by mouth 4 (four) times daily. What changed:  when to take this  reasons to take this   oxyCODONE-acetaminophen 10-325 MG tablet Commonly known as:  PERCOCET Take  1 tablet by mouth every 6 (six) hours as needed for pain. Replaces:  oxyCODONE-acetaminophen 5-325 MG tablet   rifampin 300 MG capsule Commonly known as:  RIFADIN Take 1 capsule (300 mg total) by mouth daily. Start taking on:  07/11/2016   venlafaxine XR 75 MG 24 hr capsule Commonly known as:  EFFEXOR-XR Take 150 mg by mouth daily with breakfast.   zolpidem 10 MG tablet Commonly known as:  AMBIEN TAKE 1 TABLET (10 mg) BY MOUTH AT BEDTIME AS NEEDED for sleep            Home Infusion Instuctions        Start     Ordered   07/10/16 0000  Home infusion instructions [facility] May follow [place] Dosing Protocol; May administer Cathflo as needed to maintain patency of vascular access device.; Flushing of vascular access device: per [facility] Protocol: 0.9% NaCl pre/post medica...    Question Answer  Comment  Instructions May follow [place] Dosing Protocol   Instructions May administer Cathflo as needed to maintain patency of vascular access device.   Instructions Flushing of vascular access device: per [facility] Protocol: 0.9% NaCl pre/post medication administration and prn patency; Heparin 100 u/ml, 6m for implanted ports and Heparin 10u/ml, 545mfor all other central venous catheters.   Instructions May follow [facility] Anaphylaxis Protocol for First Dose Administration in the home: 0.9% NaCl at 25-50 ml/hr to maintain IV access for protocol meds. Epinephrine 0.3 ml IV/IM PRN and Benadryl 25-50 IV/IM PRN s/s of anaphylaxis.   Instructions [facility] Infusion Coordinator (RN) to assist per patient IV care needs in the home PRN.      07/10/16 1513       Durable Medical Equipment        Start     Ordered   07/10/16 1003  For home use only DME 3 n 1  Once     07/10/16 1003   07/10/16 1002  For home use only DME Walker rolling  Once    Question:  Patient needs a walker to treat with the following condition  Answer:  Deep postoperative wound infection   07/10/16 1003     Follow-up Information    MaMarybelle KillingsMD Follow up in 2 day(s).   Specialty:  Orthopedic Surgery Contact information: [address] [id number] Follow up in 1 week(s).   Specialty:  Family Medicine Contact information: [phone]        JeBobby RumpfMD Follow up in 4 week(s).   Specialty:  Infectious Diseases Contact information: [address] [place] [phone]        Allergies  Allergen Reactions  . Aspirin Anaphylaxis  . Ibuprofen Anaphylaxis  . Ativan [Lorazepam] Other (See Comments)    Makes agitated, combative   . Ketamine Other (See Comments)    Hallucinations  . Toradol [Ketorolac Tromethamine] Itching, Swelling and Rash    UNSPECIFIED REACTION   .  Tramadol Hcl Itching, Swelling and Rash    SWELLING REACTION UNSPECIFIED  Tolerates Dilaudid 06/2016.  TDD.    Consultations:  Dr. YaLorin MercyDr. HaJohnnye Sima Procedures/Studies: Dg Chest 2 View  Result Date: 07/05/2016 CLINICAL DATA:  Postop fever for 3 days. Status post cervical spine fusion. EXAM: CHEST  2 VIEW COMPARISON:  12/13/2015 FINDINGS: Low lung volumes noted with  mild bibasilar atelectasis. No evidence of pulmonary consolidation or pleural effusion. Heart size and mediastinal contours are within normal limits. Right shoulder prosthesis and cervical spine fusion hardware again noted. IMPRESSION: Low lung volumes with mild bibasilar atelectasis. Electronically Signed   By: Earle Gell M.D.   On: 07/05/2016 18:49   Dg Cervical Spine 2 Or 3 Views  Result Date: 06/22/2016 CLINICAL DATA:  Status post cervical fusion EXAM: CERVICAL SPINE - 2-3 VIEW COMPARISON:  May 22, 2016 and intraoperative images June 21, 2016 FINDINGS: Frontal and lateral views were obtained. There is now posterior wire fixation at the spinous processes of C5, C6, and C7. There remains anterior screw and plate fixation at C5, C6, and C7. There are disc spacers at C5-6 and C6-7. No evident fracture. There is 2 mm of anterolisthesis of C3 on C4. No other spondylolisthesis. Prevertebral soft tissues and predental space regions are normal. There is marked disc space narrowing at C4-5 with moderately severe disc space narrowing at C5-6 and C6-7. There is moderate disc space narrowing at C7-T1. No erosive change. Lung apices are clear. IMPRESSION: Postoperative change with new posterior fixation with wire from C5-C7. Anterior fusion from C5-C7 also present, unchanged. Support hardware intact. Multilevel arthropathy. Slight spondylolisthesis at C3-4 felt to be due to underlying spondylosis. No fracture. Electronically Signed   By: Lowella Grip III M.D.   On: 06/22/2016 08:21   Dg Cervical Spine 2-3 Views  Result Date:  06/21/2016 CLINICAL DATA:  C5 through C7 spinous process wiring EXAM: CERVICAL SPINE - 2-3 VIEW COMPARISON:  None. FINDINGS: Three C-arm fluoroscopic views of the cervical spine demonstrate cerclage wire fixation posteriorly from C5 through C7. ACDF with plate and screw fixation of from C5 through C7 anteriorly. Partially visualized shoulder arthroplasty. IMPRESSION: ACDF from C5 through C7 with cerclage wire encompassing the spinous processes at these levels. Electronically Signed   By: Ashley Royalty M.D.   On: 06/21/2016 19:47   Dg C-arm 1-60 Min  Result Date: 06/30/2016 CLINICAL DATA:  C5 through C7 spinous process wiring EXAM: CERVICAL SPINE - 2-3 VIEW COMPARISON:  None. FINDINGS: Three C-arm fluoroscopic views of the cervical spine demonstrate cerclage wire fixation posteriorly from C5 through C7. ACDF with plate and screw fixation of from C5 through C7 anteriorly. Partially visualized shoulder arthroplasty. IMPRESSION: ACDF from C5 through C7 with cerclage wire encompassing the spinous processes at these levels. Electronically Signed   By: Ashley Royalty M.D.   On: 06/21/2016 19:47     Wound debridement done on 07/07/2016  Subjective: Patient was seen and examined at bedside. She gets extremely agitated and loud intermittently. She is blaming me saying "you discontinued the dilaudid".  No overnight fever or vomiting.    Discharge Exam: Vitals:   07/09/16 1235 07/10/16 0411  BP: (!) 146/72 (!) 141/78  Pulse: 88 79  Resp: 18 17  Temp: 98.8 F (37.1 C) 99.1 F (37.3 C)   Vitals:   07/08/16 2042 07/09/16 0455 07/09/16 1235 07/10/16 0411  BP: 104/68 102/64 (!) 146/72 (!) 141/78  Pulse: 72 74 88 79  Resp: '17 17 18 17  ' Temp:  98.3 F (36.8 C) 98.8 F (37.1 C) 99.1 F (37.3 C)  TempSrc: Oral Oral Oral Oral  SpO2: 95% 96% 92% 94%  Weight:      Height:        General exam: gets intermittently agitated and loud; alert and awake Respiratory system: Clear to auscultation. Respiratory  effort normal. Cardiovascular system:S1 &S2 heard,  RRR. Gastrointestinal system:Abdomen is nondistended, soft and nontender. No organomegaly or masses felt. Normal bowel sounds heard. Extremities: no cyanosis, clubbing, edema    The results of significant diagnostics from this hospitalization (including imaging, microbiology, ancillary and laboratory) are listed below for reference.     Microbiology: Recent Results (from the past 240 hour(s))  Blood culture (routine x 2)     Status: None   Collection Time: 07/05/16  7:30 PM  Result Value Ref Range Status   Specimen Description BLOOD RIGHT ANTECUBITAL  Final   Special Requests   Final    BOTTLES DRAWN AEROBIC AND ANAEROBIC Blood Culture adequate volume   Culture NO GROWTH 5 DAYS  Final   Report Status 07/10/2016 FINAL  Final  Blood culture (routine x 2)     Status: None   Collection Time: 07/05/16  7:40 PM  Result Value Ref Range Status   Specimen Description BLOOD LEFT ANTECUBITAL  Final   Special Requests BOTTLES DRAWN AEROBIC AND ANAEROBIC 5CC  Final   Culture NO GROWTH 5 DAYS  Final   Report Status 07/10/2016 FINAL  Final  Urine culture     Status: Abnormal   Collection Time: 07/05/16  9:56 PM  Result Value Ref Range Status   Specimen Description URINE, CLEAN CATCH  Final   Special Requests NONE  Final   Culture MULTIPLE SPECIES PRESENT, SUGGEST RECOLLECTION (A)  Final   Report Status 07/07/2016 FINAL  Final  Respiratory Panel by PCR     Status: None   Collection Time: 07/06/16 12:31 AM  Result Value Ref Range Status   Adenovirus NOT DETECTED NOT DETECTED Final   Coronavirus 229E NOT DETECTED NOT DETECTED Final   Coronavirus HKU1 NOT DETECTED NOT DETECTED Final   Coronavirus NL63 NOT DETECTED NOT DETECTED Final   Coronavirus OC43 NOT DETECTED NOT DETECTED Final   Metapneumovirus NOT DETECTED NOT DETECTED Final   Rhinovirus / Enterovirus NOT DETECTED NOT DETECTED Final   Influenza A NOT DETECTED NOT DETECTED Final    Influenza B NOT DETECTED NOT DETECTED Final   Parainfluenza Virus 1 NOT DETECTED NOT DETECTED Final   Parainfluenza Virus 2 NOT DETECTED NOT DETECTED Final   Parainfluenza Virus 3 NOT DETECTED NOT DETECTED Final   Parainfluenza Virus 4 NOT DETECTED NOT DETECTED Final   Respiratory Syncytial Virus NOT DETECTED NOT DETECTED Final   Bordetella pertussis NOT DETECTED NOT DETECTED Final   Chlamydophila pneumoniae NOT DETECTED NOT DETECTED Final   Mycoplasma pneumoniae NOT DETECTED NOT DETECTED Final  Surgical pcr screen     Status: Abnormal   Collection Time: 07/06/16  8:24 AM  Result Value Ref Range Status   MRSA, PCR NEGATIVE NEGATIVE Final   Staphylococcus aureus POSITIVE (A) NEGATIVE Final    Comment:        The Xpert SA Assay (FDA approved for NASAL specimens in patients over 16 years of age), is one component of a comprehensive surveillance program.  Test performance has been validated by [facility] for patients greater than or equal to 56 year old. It is not intended to diagnose infection nor to guide or monitor treatment.   Aerobic Culture (superficial specimen)     Status: None   Collection Time: 07/06/16  8:29 AM  Result Value Ref Range Status   Specimen Description ABSCESS NECK  Final   Special Requests POSTERIOR NECK INCISION  Final   Gram Stain   Final    RARE WBC PRESENT, PREDOMINANTLY PMN RARE GRAM POSITIVE COCCI IN  SINGLES    Culture FEW STAPHYLOCOCCUS AUREUS  Final   Report Status 07/08/2016 FINAL  Final   Organism ID, Bacteria STAPHYLOCOCCUS AUREUS  Final      Susceptibility   Staphylococcus aureus - MIC*    CIPROFLOXACIN <=0.5 SENSITIVE Sensitive     ERYTHROMYCIN <=0.25 SENSITIVE Sensitive     GENTAMICIN <=0.5 SENSITIVE Sensitive     OXACILLIN 0.5 SENSITIVE Sensitive     TETRACYCLINE <=1 SENSITIVE Sensitive     VANCOMYCIN <=0.5 SENSITIVE Sensitive     TRIMETH/SULFA <=10 SENSITIVE Sensitive     CLINDAMYCIN <=0.25 SENSITIVE Sensitive     RIFAMPIN <=0.5  SENSITIVE Sensitive     Inducible Clindamycin NEGATIVE Sensitive     * FEW STAPHYLOCOCCUS AUREUS  Aerobic/Anaerobic Culture (surgical/deep wound)     Status: None (Preliminary result)   Collection Time: 07/07/16  8:09 AM  Result Value Ref Range Status   Specimen Description WOUND NECK  Final   Special Requests POSTERIOR  Final   Gram Stain   Final    RARE WBC PRESENT, PREDOMINANTLY PMN RARE GRAM POSITIVE COCCI IN PAIRS    Culture   Final    MODERATE STAPHYLOCOCCUS AUREUS SUSCEPTIBILITIES PERFORMED ON PREVIOUS CULTURE WITHIN THE LAST 5 DAYS. NO ANAEROBES ISOLATED; CULTURE IN PROGRESS FOR 5 DAYS    Report Status PENDING  Incomplete     Labs: BNP (last 3 results) No results for input(s): BNP in the last 8760 hours. Basic Metabolic Panel:  Recent Labs Lab 07/05/16 1817 07/07/16 0513 07/08/16 0337 07/09/16 0435 07/10/16 0423  NA 134* 138 136 139 140  K 4.3 4.3 3.8 3.6 3.3*  CL 102 106 101 103 101  CO2 '22 25 25 26 27  ' GLUCOSE 116* 102* 112* 101* 145*  BUN 23* 6 5* 5* <5*  CREATININE 0.84 0.74 0.86 1.16* 1.24*  CALCIUM 9.1 8.7* 9.1 9.2 9.1  MG  --   --  2.1  --  2.2   Liver Function Tests:  Recent Labs Lab 07/05/16 1817  AST 29  ALT 39  ALKPHOS 115  BILITOT 0.5  PROT 6.6  ALBUMIN 3.6   No results for input(s): LIPASE, AMYLASE in the last 168 hours. No results for input(s): AMMONIA in the last 168 hours. CBC:  Recent Labs Lab 07/05/16 1817 07/07/16 0513 07/08/16 0337 07/09/16 0435 07/10/16 0423  WBC 20.0* 10.8* 7.4 7.3 6.9  NEUTROABS 17.3* 7.4 4.7 4.6 4.5  HGB 12.4 10.4* 9.5* 9.6* 11.2*  HCT 38.0 33.4* 29.7* 30.3* 35.1*  MCV 87.0 88.6 88.4 86.1 86.0  PLT 341 298 336 369 490*   Cardiac Enzymes: No results for input(s): CKTOTAL, CKMB, CKMBINDEX, TROPONINI in the last 168 hours. BNP: Invalid input(s): POCBNP CBG:  Recent Labs Lab 07/09/16 1235 07/09/16 2357 07/10/16 0406 07/10/16 1155 07/10/16 1438  GLUCAP 134* 93 89 170* 82   D-Dimer No  results for input(s): DDIMER in the last 72 hours. Hgb A1c No results for input(s): HGBA1C in the last 72 hours. Lipid Profile No results for input(s): CHOL, HDL, LDLCALC, TRIG, CHOLHDL, LDLDIRECT in the last 72 hours. Thyroid function studies No results for input(s): TSH, T4TOTAL, T3FREE, THYROIDAB in the last 72 hours.  Invalid input(s): FREET3 Anemia work up No results for input(s): VITAMINB12, FOLATE, FERRITIN, TIBC, IRON, RETICCTPCT in the last 72 hours. Urinalysis    Component Value Date/Time   COLORURINE STRAW (A) 07/05/2016 2126   APPEARANCEUR CLEAR 07/05/2016 2126   LABSPEC 1.010 07/05/2016 2126   PHURINE 5.0 07/05/2016 2126  GLUCOSEU NEGATIVE 07/05/2016 2126   GLUCOSEU NEGATIVE 08/20/2009 1324   HGBUR SMALL (A) 07/05/2016 2126   BILIRUBINUR NEGATIVE 07/05/2016 2126   BILIRUBINUR neg 08/04/2013 [place] 07/05/2016 2126   PROTEINUR NEGATIVE 07/05/2016 2126   UROBILINOGEN 0.2 06/26/2014 1553   NITRITE NEGATIVE 07/05/2016 2126   LEUKOCYTESUR LARGE (A) 07/05/2016 2126   Sepsis Labs Invalid input(s): PROCALCITONIN,  WBC,  LACTICIDVEN Microbiology Recent Results (from the past 240 hour(s))  Blood culture (routine x 2)     Status: None   Collection Time: 07/05/16  7:30 PM  Result Value Ref Range Status   Specimen Description BLOOD RIGHT ANTECUBITAL  Final   Special Requests   Final    BOTTLES DRAWN AEROBIC AND ANAEROBIC Blood Culture adequate volume   Culture NO GROWTH 5 DAYS  Final   Report Status 07/10/2016 FINAL  Final  Blood culture (routine x 2)     Status: None   Collection Time: 07/05/16  7:40 PM  Result Value Ref Range Status   Specimen Description BLOOD LEFT ANTECUBITAL  Final   Special Requests BOTTLES DRAWN AEROBIC AND ANAEROBIC 5CC  Final   Culture NO GROWTH 5 DAYS  Final   Report Status 07/10/2016 FINAL  Final  Urine culture     Status: Abnormal   Collection Time: 07/05/16  9:56 PM  Result Value Ref Range Status   Specimen  Description URINE, CLEAN CATCH  Final   Special Requests NONE  Final   Culture MULTIPLE SPECIES PRESENT, SUGGEST RECOLLECTION (A)  Final   Report Status 07/07/2016 FINAL  Final  Respiratory Panel by PCR     Status: None   Collection Time: 07/06/16 12:31 AM  Result Value Ref Range Status   Adenovirus NOT DETECTED NOT DETECTED Final   Coronavirus 229E NOT DETECTED NOT DETECTED Final   Coronavirus HKU1 NOT DETECTED NOT DETECTED Final   Coronavirus NL63 NOT DETECTED NOT DETECTED Final   Coronavirus OC43 NOT DETECTED NOT DETECTED Final   Metapneumovirus NOT DETECTED NOT DETECTED Final   Rhinovirus / Enterovirus NOT DETECTED NOT DETECTED Final   Influenza A NOT DETECTED NOT DETECTED Final   Influenza B NOT DETECTED NOT DETECTED Final   Parainfluenza Virus 1 NOT DETECTED NOT DETECTED Final   Parainfluenza Virus 2 NOT DETECTED NOT DETECTED Final   Parainfluenza Virus 3 NOT DETECTED NOT DETECTED Final   Parainfluenza Virus 4 NOT DETECTED NOT DETECTED Final   Respiratory Syncytial Virus NOT DETECTED NOT DETECTED Final   Bordetella pertussis NOT DETECTED NOT DETECTED Final   Chlamydophila pneumoniae NOT DETECTED NOT DETECTED Final   Mycoplasma pneumoniae NOT DETECTED NOT DETECTED Final  Surgical pcr screen     Status: Abnormal   Collection Time: 07/06/16  8:24 AM  Result Value Ref Range Status   MRSA, PCR NEGATIVE NEGATIVE Final   Staphylococcus aureus POSITIVE (A) NEGATIVE Final    Comment:        The Xpert SA Assay (FDA approved for NASAL specimens in patients over 28 years of age), is one component of a comprehensive surveillance program.  Test performance has been validated by [facility] for patients greater than or equal to 37 year old. It is not intended to diagnose infection nor to guide or monitor treatment.   Aerobic Culture (superficial specimen)     Status: None   Collection Time: 07/06/16  8:29 AM  Result Value Ref Range Status   Specimen Description ABSCESS NECK   Final   Special Requests POSTERIOR  NECK INCISION  Final   Gram Stain   Final    RARE WBC PRESENT, PREDOMINANTLY PMN RARE GRAM POSITIVE COCCI IN SINGLES    Culture FEW STAPHYLOCOCCUS AUREUS  Final   Report Status 07/08/2016 FINAL  Final   Organism ID, Bacteria STAPHYLOCOCCUS AUREUS  Final      Susceptibility   Staphylococcus aureus - MIC*    CIPROFLOXACIN <=0.5 SENSITIVE Sensitive     ERYTHROMYCIN <=0.25 SENSITIVE Sensitive     GENTAMICIN <=0.5 SENSITIVE Sensitive     OXACILLIN 0.5 SENSITIVE Sensitive     TETRACYCLINE <=1 SENSITIVE Sensitive     VANCOMYCIN <=0.5 SENSITIVE Sensitive     TRIMETH/SULFA <=10 SENSITIVE Sensitive     CLINDAMYCIN <=0.25 SENSITIVE Sensitive     RIFAMPIN <=0.5 SENSITIVE Sensitive     Inducible Clindamycin NEGATIVE Sensitive     * FEW STAPHYLOCOCCUS AUREUS  Aerobic/Anaerobic Culture (surgical/deep wound)     Status: None (Preliminary result)   Collection Time: 07/07/16  8:09 AM  Result Value Ref Range Status   Specimen Description WOUND NECK  Final   Special Requests POSTERIOR  Final   Gram Stain   Final    RARE WBC PRESENT, PREDOMINANTLY PMN RARE GRAM POSITIVE COCCI IN PAIRS    Culture   Final    MODERATE STAPHYLOCOCCUS AUREUS SUSCEPTIBILITIES PERFORMED ON PREVIOUS CULTURE WITHIN THE LAST 5 DAYS. NO ANAEROBES ISOLATED; CULTURE IN PROGRESS FOR 5 DAYS    Report Status PENDING  Incomplete     Time coordinating discharge: Over 35 minutes  SIGNED:   Aline August, MD  [facility] 07/10/2016, 4:36 PM Pager [phone]  If 7PM-7AM, please contact night-coverage [email or web address] Password [email or web address]

## 2016-07-10 NOTE — Telephone Encounter (Signed)
Please advise 

## 2016-07-10 NOTE — Consult Note (Addendum)
WOC Nurse wound follow up Wound type: Vac dressing performed with Dr Ophelia Charter at the bedside to assess the wound appearance with the first post-op dressing change Measurement: 5X3X4.5cm Wound bed: full thickness wound, red with antibiotic beads visible Drainage (amount, consistency, odor) Small amt tan drainage, no odor Periwound: Intact skin surrounding Dressing procedure/placement/frequency: Pain meds given prior to procedure and pt tolerated with minimal discomfort.  Applied one piece of black foam to cont suction.  Plan for dressing change Wed if patient is still in the hospital at that time. Cammie Mcgee MSN, RN, CWOCN, [place], CNS [phone]

## 2016-07-10 NOTE — Consult Note (Signed)
[facility] for Infectious Disease  Date of Admission:  07/05/2016  Date of Consult:  07/10/2016  Reason for Consult: Osteomyelitis Referring Physician: Lorin Mercy  Impression/Recommendation Osteomyelitis C5-7 fusion/wiring 06-21-16 MSSA  AKI  RA  Will add rifampin (explained side effects to pt) Needs PIC Will need weekly LFTs Plan for 38 more days of IV anbx Possible po afterwards Check ESR and CRP Will see her in clinic in 4-5 weeks  Thank you so much for this interesting consult,   Bobby Rumpf (pager) [phone] [email or web address]  Kara [name] is an 61 y.o. female.  HPI: 61 yo F with RA, opioid dependence, who underwent C5-7 fusion/wiring for pseudoarthrosis on 06-21-16. She developed drainage from her wound and low grade temps on 4-11.  She came to ED and was found to have temp 101.2, WBC 20. She was started on vanco/zosyn and was taken to OR on 4-13. Purulnece was found, Cx were sent and vanco/tobra beads were placed.  A vac was placed.   Post-operatively she was agitated and combative. She was dx with delirium (which has improved).  Her Cx grew MSSA.     Past Medical History:  Diagnosis Date  . ADD (attention deficit disorder)    on Adderal  . Aggressive behavior of adult   . Allergy   . Anxiety   . Arthritis   . Asthma    as a child  . Cellulitis of breast 11/2013   RIGHT BREAST  . Chronic fatigue and immune dysfunction syndrome ([place])   . Chronic kidney disease    due to infection from stone- no regular visits- fine now  . Chronic pain    goes to [facility] for pain control  . Cold sore   . 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 Depression    takes meds daily  . Family history of malignant neoplasm of breast   . Fibromyalgia   . Nephrolithiasis   . Osteoporosis   . Other specified rheumatoid arthritis, right shoulder ([place]) 08/01/2011  . Post-nasal drip    hx of  . Rheumatoid  arthritis(714.0)    autoimmune arthritis; methotrexate once/week  . Urinary incontinence     Past Surgical History:  Procedure Laterality Date  . ANTERIOR CERVICAL DECOMP/DISCECTOMY FUSION N/A 03/15/2015   Procedure: Cervical five-six, Cerival six-seven, Anterior Cervical Discectomy and Fusion, Allograft and Plate;  Surgeon: Marybelle Killings, MD;  Location: [facility];  Service: Orthopedics;  Laterality: N/A;  . BACK SURGERY  2011,16   lower back fusion l4-5, s1  . BLADDER SURGERY  2009  . BREAST ENHANCEMENT SURGERY  2003  . breast implants removed    . BREAST SURGERY  10/2013   REMOVAL OF BREAST IMPLANTS  . CERVICAL WOUND DEBRIDEMENT N/A 07/07/2016   Procedure: IRRIGATION AND DEBRIDEMENT POSTERIOR NECK;  Surgeon: Marybelle Killings, MD;  Location: [facility];  Service: Orthopedics;  Laterality: N/A;  . INCISION AND DRAINAGE ABSCESS Right 01/16/2014   Procedure: INCISION AND DRAINAGE AND OF RIGHT BREAST ABCESS;  Surgeon: Excell Seltzer, MD;  Location: [facility];  Service: General;  Laterality: Right;  . JOINT REPLACEMENT    . LIPOSUCTION  2003   abdominal  . LUMBAR LAMINECTOMY/DECOMPRESSION MICRODISCECTOMY N/A 12/10/2015   Procedure: Right L3-4 Hemilaminectomy, Excision of herniated nucleus pulposus;  Surgeon: Marybelle Killings, MD;  Location: [facility];  Service: Orthopedics;  Laterality: N/A;  . MASS EXCISION  11/03/2011   Procedure: MINOR  EXCISION OF MASS;  Surgeon: Cammie Sickle., MD;  Location: [facility];  Service: Orthopedics;  Laterality: Left;  debride IP joint, cyst excision left index  . POSTERIOR CERVICAL FUSION/FORAMINOTOMY N/A 06/21/2016   Procedure: POSTERIOR CERVICAL FUSION C5-C7 SPINOUS PROCESS WIRING;  Surgeon: Marybelle Killings, MD;  Location: [facility];  Service: Orthopedics;  Laterality: N/A;  . POSTERIOR FUSION CERVICAL SPINE  06/21/2016   C5 C7  . SHOULDER SURGERY  2012   right  . TONSILLECTOMY  1987  . TOTAL SHOULDER ARTHROPLASTY  08/01/2011   Procedure: TOTAL SHOULDER ARTHROPLASTY;   Surgeon: Johnny Bridge, MD;  Location: [facility];  Service: Orthopedics;  Laterality: Right;  Right total shoulder arthroplasty  . TUBAL LIGATION  1988     Allergies  Allergen Reactions  . Aspirin Anaphylaxis  . Ibuprofen Anaphylaxis  . Ativan [Lorazepam] Other (See Comments)    Makes agitated, combative   . Ketamine Other (See Comments)    Hallucinations  . Toradol [Ketorolac Tromethamine] Itching, Swelling and Rash    UNSPECIFIED REACTION   . Tramadol Hcl Itching, Swelling and Rash    SWELLING REACTION UNSPECIFIED  Tolerates Dilaudid 06/2016.  TDD.    Medications:  Scheduled: . amphetamine-dextroamphetamine  60 mg Oral Daily  .  ceFAZolin (ANCEF) IV  2 g Intravenous Q8H  . Chlorhexidine Gluconate Cloth  6 each Topical Daily  . enoxaparin (LOVENOX) injection  40 mg Subcutaneous Daily  . insulin aspart  0-9 Units Subcutaneous Q4H  . mupirocin ointment  1 application Nasal BID  . venlafaxine XR  150 mg Oral Q breakfast    Abtx:  Anti-infectives    Start     Dose/Rate Route Frequency Ordered Stop   07/09/16 1400  ceFAZolin (ANCEF) IVPB 2g/100 mL premix     2 g 200 mL/hr over 30 Minutes Intravenous Every 8 hours 07/09/16 1230     07/07/16 2200  vancomycin (VANCOCIN) IVPB 1000 mg/200 mL premix  Status:  Discontinued     1,000 mg 200 mL/hr over 60 Minutes Intravenous Every 12 hours 07/07/16 1338 07/09/16 1228   07/07/16 0826  vancomycin (VANCOCIN) powder  Status:  Discontinued       As needed 07/07/16 0826 07/07/16 0903   07/07/16 0825  gentamicin (GARAMYCIN) injection  Status:  Discontinued       As needed 07/07/16 0826 07/07/16 0903   07/06/16 1000  vancomycin (VANCOCIN) IVPB 750 mg/150 ml premix  Status:  Discontinued     750 mg 150 mL/hr over 60 Minutes Intravenous Every 12 hours 07/06/16 0359 07/07/16 1338   07/06/16 0400  piperacillin-tazobactam (ZOSYN) IVPB 3.375 g  Status:  Discontinued     3.375 g 12.5 mL/hr over 240 Minutes Intravenous Every 8 hours 07/06/16 0359  07/09/16 1228   07/05/16 2200  vancomycin (VANCOCIN) IVPB 1000 mg/200 mL premix     1,000 mg 200 mL/hr over 60 Minutes Intravenous  Once 07/05/16 2150 07/05/16 2324   07/05/16 2200  piperacillin-tazobactam (ZOSYN) IVPB 3.375 g     3.375 g 100 mL/hr over 30 Minutes Intravenous  Once 07/05/16 2150 07/05/16 2249      Total days of antibiotics: 4 (ancef)          Social History:  reports that she quit smoking about 10 years ago. Her smoking use included Cigarettes. She has a 5.00 pack-year smoking history. She has never used smokeless tobacco. She reports that she does not drink alcohol or use drugs.  Family History  Problem Relation Age of Onset  . Arthritis Mother   . Heart disease Mother     ?psvt  . Breast cancer Mother 2    TAH/BSO  . Cancer Mother   . Mental illness Mother   . COPD Father   . Hypertension Father   . Alcohol abuse Father   . Mental illness Father   . Heart disease Father   . Healthy Daughter   . Breast cancer Maternal Aunt 27    deceased  . Cancer Cousin 85    female; unknown primary  . Colon cancer Paternal Aunt 79    deceased at 52  . Stomach cancer Paternal Uncle 90    deceased at 75  . Alcohol abuse Brother   . Cancer Brother   . Hodgkin's lymphoma Brother   . Mental illness Brother   . HIV Brother   . Healthy Brother   . Healthy Son     General ROS: no fever, chills, normal BM, normal urination, + wound pain.  Please see HPI. 12 point ROS o/w (-)  Blood pressure (!) 141/78, pulse 79, temperature 99.1 F (37.3 C), temperature source Oral, resp. rate 17, height '5\' 4"'  (1.626 m), weight 70.3 kg (155 lb), SpO2 94 %. General appearance: alert, cooperative and no distress Eyes: negative findings: conjunctivae and sclerae normal and pupils equal, round, reactive to light and accomodation Throat: normal findings: oropharynx pink & moist without lesions or evidence of thrush Neck: no adenopathy, supple, symmetrical, trachea midline and midline  posterior wound, vac in place Lungs: clear to auscultation bilaterally Heart: regular rate and rhythm Abdomen: normal findings: bowel sounds normal and soft, non-tender Extremities: edema none   Results for orders placed or performed during the hospital encounter of 07/05/16 (from the past 48 hour(s))  Glucose, capillary     Status: Abnormal   Collection Time: 07/08/16 12:33 PM  Result Value Ref Range   Glucose-Capillary 146 (H) 65 - 99 mg/dL  Glucose, capillary     Status: Abnormal   Collection Time: 07/08/16  8:36 PM  Result Value Ref Range   Glucose-Capillary 127 (H) 65 - 99 mg/dL  Glucose, capillary     Status: None   Collection Time: 07/09/16 12:22 AM  Result Value Ref Range   Glucose-Capillary 84 65 - 99 mg/dL  CBC with Differential/Platelet     Status: Abnormal   Collection Time: 07/09/16  4:35 AM  Result Value Ref Range   WBC 7.3 4.0 - 10.5 K/uL   RBC 3.52 (L) 3.87 - 5.11 MIL/uL   Hemoglobin 9.6 (L) 12.0 - 15.0 g/dL   HCT 30.3 (L) 36.0 - 46.0 %   MCV 86.1 78.0 - 100.0 fL   MCH 27.3 26.0 - 34.0 pg   MCHC 31.7 30.0 - 36.0 g/dL   RDW 12.8 11.5 - 15.5 %   Platelets 369 150 - 400 K/uL   Neutrophils Relative % 62 %   Neutro Abs 4.6 1.7 - 7.7 K/uL   Lymphocytes Relative 20 %   Lymphs Abs 1.5 0.7 - 4.0 K/uL   Monocytes Relative 13 %   Monocytes Absolute 0.9 0.1 - 1.0 K/uL   Eosinophils Relative 5 %   Eosinophils Absolute 0.4 0.0 - 0.7 K/uL   Basophils Relative 0 %   Basophils Absolute 0.0 0.0 - 0.1 K/uL  Basic metabolic panel     Status: Abnormal   Collection Time: 07/09/16  4:35 AM  Result Value Ref Range   Sodium 139 135 -  145 mmol/L   Potassium 3.6 3.5 - 5.1 mmol/L   Chloride 103 101 - 111 mmol/L   CO2 26 22 - 32 mmol/L   Glucose, Bld 101 (H) 65 - 99 mg/dL   BUN 5 (L) 6 - 20 mg/dL   Creatinine, Ser 1.16 (H) 0.44 - 1.00 mg/dL   Calcium 9.2 8.9 - 10.3 mg/dL   GFR calc non Af Amer 50 (L) >60 mL/min   GFR calc Af Amer 59 (L) >60 mL/min    Comment: (NOTE) The  eGFR has been calculated using the CKD EPI equation. This calculation has not been validated in all clinical situations. eGFR's persistently <60 mL/min signify possible Chronic Kidney Disease.    Anion gap 10 5 - 15  C-reactive protein     Status: Abnormal   Collection Time: 07/09/16  4:35 AM  Result Value Ref Range   CRP 7.6 (H) <1.0 mg/dL  Glucose, capillary     Status: None   Collection Time: 07/09/16  4:51 AM  Result Value Ref Range   Glucose-Capillary 97 65 - 99 mg/dL  Glucose, capillary     Status: None   Collection Time: 07/09/16  8:11 AM  Result Value Ref Range   Glucose-Capillary 93 65 - 99 mg/dL  Glucose, capillary     Status: Abnormal   Collection Time: 07/09/16 12:35 PM  Result Value Ref Range   Glucose-Capillary 134 (H) 65 - 99 mg/dL  Glucose, capillary     Status: None   Collection Time: 07/09/16 11:57 PM  Result Value Ref Range   Glucose-Capillary 93 65 - 99 mg/dL  Glucose, capillary     Status: None   Collection Time: 07/10/16  4:06 AM  Result Value Ref Range   Glucose-Capillary 89 65 - 99 mg/dL  CBC with Differential/Platelet     Status: Abnormal   Collection Time: 07/10/16  4:23 AM  Result Value Ref Range   WBC 6.9 4.0 - 10.5 K/uL   RBC 4.08 3.87 - 5.11 MIL/uL   Hemoglobin 11.2 (L) 12.0 - 15.0 g/dL   HCT 35.1 (L) 36.0 - 46.0 %   MCV 86.0 78.0 - 100.0 fL   MCH 27.5 26.0 - 34.0 pg   MCHC 31.9 30.0 - 36.0 g/dL   RDW 12.9 11.5 - 15.5 %   Platelets 490 (H) 150 - 400 K/uL   Neutrophils Relative % 65 %   Neutro Abs 4.5 1.7 - 7.7 K/uL   Lymphocytes Relative 24 %   Lymphs Abs 1.7 0.7 - 4.0 K/uL   Monocytes Relative 7 %   Monocytes Absolute 0.5 0.1 - 1.0 K/uL   Eosinophils Relative 4 %   Eosinophils Absolute 0.3 0.0 - 0.7 K/uL   Basophils Relative 0 %   Basophils Absolute 0.0 0.0 - 0.1 K/uL  Basic metabolic panel     Status: Abnormal   Collection Time: 07/10/16  4:23 AM  Result Value Ref Range   Sodium 140 135 - 145 mmol/L   Potassium 3.3 (L) 3.5 - 5.1  mmol/L   Chloride 101 101 - 111 mmol/L   CO2 27 22 - 32 mmol/L   Glucose, Bld 145 (H) 65 - 99 mg/dL   BUN <5 (L) 6 - 20 mg/dL   Creatinine, Ser 1.24 (H) 0.44 - 1.00 mg/dL   Calcium 9.1 8.9 - 10.3 mg/dL   GFR calc non Af Amer 47 (L) >60 mL/min   GFR calc Af Amer 54 (L) >60 mL/min    Comment: (NOTE)  The eGFR has been calculated using the CKD EPI equation. This calculation has not been validated in all clinical situations. eGFR's persistently <60 mL/min signify possible Chronic Kidney Disease.    Anion gap 12 5 - 15  Magnesium     Status: None   Collection Time: 07/10/16  4:23 AM  Result Value Ref Range   Magnesium 2.2 1.7 - 2.4 mg/dL      Component Value Date/Time   SDES WOUND NECK 07/07/2016 0809   SPECREQUEST POSTERIOR 07/07/2016 0809   CULT  07/07/2016 0809    MODERATE STAPHYLOCOCCUS AUREUS SUSCEPTIBILITIES PERFORMED ON PREVIOUS CULTURE WITHIN THE LAST 5 DAYS. NO ANAEROBES ISOLATED; CULTURE IN PROGRESS FOR 5 DAYS    REPTSTATUS PENDING 07/07/2016 0809   No results found. Recent Results (from the past 240 hour(s))  Blood culture (routine x 2)     Status: None (Preliminary result)   Collection Time: 07/05/16  7:30 PM  Result Value Ref Range Status   Specimen Description BLOOD RIGHT ANTECUBITAL  Final   Special Requests   Final    BOTTLES DRAWN AEROBIC AND ANAEROBIC Blood Culture adequate volume   Culture NO GROWTH 4 DAYS  Final   Report Status PENDING  Incomplete  Blood culture (routine x 2)     Status: None (Preliminary result)   Collection Time: 07/05/16  7:40 PM  Result Value Ref Range Status   Specimen Description BLOOD LEFT ANTECUBITAL  Final   Special Requests BOTTLES DRAWN AEROBIC AND ANAEROBIC 5CC  Final   Culture NO GROWTH 4 DAYS  Final   Report Status PENDING  Incomplete  Urine culture     Status: Abnormal   Collection Time: 07/05/16  9:56 PM  Result Value Ref Range Status   Specimen Description URINE, CLEAN CATCH  Final   Special Requests NONE  Final    Culture MULTIPLE SPECIES PRESENT, SUGGEST RECOLLECTION (A)  Final   Report Status 07/07/2016 FINAL  Final  Respiratory Panel by PCR     Status: None   Collection Time: 07/06/16 12:31 AM  Result Value Ref Range Status   Adenovirus NOT DETECTED NOT DETECTED Final   Coronavirus 229E NOT DETECTED NOT DETECTED Final   Coronavirus HKU1 NOT DETECTED NOT DETECTED Final   Coronavirus NL63 NOT DETECTED NOT DETECTED Final   Coronavirus OC43 NOT DETECTED NOT DETECTED Final   Metapneumovirus NOT DETECTED NOT DETECTED Final   Rhinovirus / Enterovirus NOT DETECTED NOT DETECTED Final   Influenza A NOT DETECTED NOT DETECTED Final   Influenza B NOT DETECTED NOT DETECTED Final   Parainfluenza Virus 1 NOT DETECTED NOT DETECTED Final   Parainfluenza Virus 2 NOT DETECTED NOT DETECTED Final   Parainfluenza Virus 3 NOT DETECTED NOT DETECTED Final   Parainfluenza Virus 4 NOT DETECTED NOT DETECTED Final   Respiratory Syncytial Virus NOT DETECTED NOT DETECTED Final   Bordetella pertussis NOT DETECTED NOT DETECTED Final   Chlamydophila pneumoniae NOT DETECTED NOT DETECTED Final   Mycoplasma pneumoniae NOT DETECTED NOT DETECTED Final  Surgical pcr screen     Status: Abnormal   Collection Time: 07/06/16  8:24 AM  Result Value Ref Range Status   MRSA, PCR NEGATIVE NEGATIVE Final   Staphylococcus aureus POSITIVE (A) NEGATIVE Final    Comment:        The Xpert SA Assay (FDA approved for NASAL specimens in patients over 42 years of age), is one component of a comprehensive surveillance program.  Test performance has been validated by [facility] for patients  greater than or equal to 78 year old. It is not intended to diagnose infection nor to guide or monitor treatment.   Aerobic Culture (superficial specimen)     Status: None   Collection Time: 07/06/16  8:29 AM  Result Value Ref Range Status   Specimen Description ABSCESS NECK  Final   Special Requests POSTERIOR NECK INCISION  Final   Gram Stain    Final    RARE WBC PRESENT, PREDOMINANTLY PMN RARE GRAM POSITIVE COCCI IN SINGLES    Culture FEW STAPHYLOCOCCUS AUREUS  Final   Report Status 07/08/2016 FINAL  Final   Organism ID, Bacteria STAPHYLOCOCCUS AUREUS  Final      Susceptibility   Staphylococcus aureus - MIC*    CIPROFLOXACIN <=0.5 SENSITIVE Sensitive     ERYTHROMYCIN <=0.25 SENSITIVE Sensitive     GENTAMICIN <=0.5 SENSITIVE Sensitive     OXACILLIN 0.5 SENSITIVE Sensitive     TETRACYCLINE <=1 SENSITIVE Sensitive     VANCOMYCIN <=0.5 SENSITIVE Sensitive     TRIMETH/SULFA <=10 SENSITIVE Sensitive     CLINDAMYCIN <=0.25 SENSITIVE Sensitive     RIFAMPIN <=0.5 SENSITIVE Sensitive     Inducible Clindamycin NEGATIVE Sensitive     * FEW STAPHYLOCOCCUS AUREUS  Aerobic/Anaerobic Culture (surgical/deep wound)     Status: None (Preliminary result)   Collection Time: 07/07/16  8:09 AM  Result Value Ref Range Status   Specimen Description WOUND NECK  Final   Special Requests POSTERIOR  Final   Gram Stain   Final    RARE WBC PRESENT, PREDOMINANTLY PMN RARE GRAM POSITIVE COCCI IN PAIRS    Culture   Final    MODERATE STAPHYLOCOCCUS AUREUS SUSCEPTIBILITIES PERFORMED ON PREVIOUS CULTURE WITHIN THE LAST 5 DAYS. NO ANAEROBES ISOLATED; CULTURE IN PROGRESS FOR 5 DAYS    Report Status PENDING  Incomplete      07/10/2016, 11:59 AM     LOS: 4 days  Diagnosis: MSSA wound infection   Allergies  Allergen Reactions  . Aspirin Anaphylaxis  . Ibuprofen Anaphylaxis  . Ativan [Lorazepam] Other (See Comments)    Makes agitated, combative   . Ketamine Other (See Comments)    Hallucinations  . Toradol [Ketorolac Tromethamine] Itching, Swelling and Rash    UNSPECIFIED REACTION   . Tramadol Hcl Itching, Swelling and Rash    SWELLING REACTION UNSPECIFIED  Tolerates Dilaudid 06/2016.  TDD.    Discharge antibiotics: Ancef, rifampin  Duration: 38 days End Date: Aug 16, 2016  [facility] Care Per Protocol:  Labs weekly while on IV  antibiotics: _x_ CBC with differential __ BMP _x_ CMP _x_ CRP _x_ ESR   _x_ Please pull PIC at completion of IV antibiotics __ Please leave PIC in place until doctor has seen patient or been notified  Fax weekly labs to [phone]  Clinic Follow Up Appt: Quinesha Selinger 4-5 weeks   Records and images were personally reviewed where available.

## 2016-07-10 NOTE — Telephone Encounter (Signed)
Heather the Patient's nurse case manager called this morning in regards to needing some verification on this patient's wound depth. She also needs orders for home health through [facility] please. Heather's CB [phone]. Thank you

## 2016-07-10 NOTE — Progress Notes (Signed)
PHARMACY CONSULT NOTE FOR:   OUTPATIENT  PARENTERAL ANTIBIOTIC THERAPY (OPAT)   Indication: wound infection Regimen: Cefazolin 2 g IV q8h, Rifampin 300 mg po daily End date: Aug 16, 2016   IV antibiotic discharge orders are pended. To discharging provider:  please sign these orders via discharge navigator,  Select New Orders & click on the button choice - Manage This Unsigned Work.     Thank you for allowing pharmacy to be a part of this patient's care.  Mikele Sifuentes, Darl Householder 07/10/2016, 1:21 PM

## 2016-07-10 NOTE — Telephone Encounter (Signed)
Patient's daughter left voicemail requesting call back ASAP. She states that she needs to talk with the doctor about what is going on.  She would like for you to call her or the nurse.

## 2016-07-10 NOTE — Progress Notes (Signed)
Discharge instructions reviewed with pt and pt's husband and prescriptions given.  Pt's wound VAC switched over to portable VAC and demonstrated to pt's husband how to use.  Pt's husband verbalized understanding and had no questions.  Pt discharged in stable condition via wheelchair with husband.  Kara [name]

## 2016-07-10 NOTE — Discharge Instructions (Signed)
Wound Infection °A wound infection happens when germs start to grow in the wound. Germs that cause wound infections are most often bacteria. Other types of infections can occur as well. In some cases, infection can cause the wound to break open. Wound infections need treatment. If a wound infection is not treated, complications can happen. °Follow these instructions at home: °Medicines  °· Take or apply over-the-counter and prescription medicines only as told by your doctor. °· If you were prescribed antibiotic medicine, take or apply it as told by your doctor. Do not stop using the antibiotic even if your condition improves. °Wound care  °· Clean the wound each day or as told by your doctor. °¨ Wash the wound with mild soap and water. °¨ Rinse the wound with water to remove all soap. °¨ Pat the wound dry with a clean towel. Do not rub it. °· Follow instructions from your doctor about how to take care of your wound. Make sure you: °¨ Wash your hands with soap and water before you change your bandage (dressing). If you cannot use soap and water, use hand sanitizer. °¨ Change your bandage as told by your doctor. °¨ Leave stitches (sutures), skin glue, or skin tape (adhesive) strips in place if your wound has been closed. They may need to stay in place for 2 weeks or longer. If tape strips get loose and curl up, you may trim the loose edges. Do not remove tape strips completely unless your doctor says it is okay. Some wounds are left open to heal on their own. °· Check your wound every day for signs of infection. Watch for: °¨ More redness, swelling, or pain. °¨ More fluid or blood. °¨ Warmth. °¨ Pus or a bad smell. °General instructions  °· Keep the bandage dry until your doctor says it can be removed. °· Do not take baths, swim, use a hot tub, or do anything that would put your wound underwater until your doctor says it is okay. °· Raise (elevate) the injured area above the level of your heart while you are sitting  or lying down. °· Do not scratch or pick at the wound. °· Keep all follow-up visits as told by your doctor. This is important. °Contact a doctor if: °· Medicine does not help your pain. °· You have more redness, swelling, or pain in the area of your wound. °· You have more fluid or blood coming from your wound. °· Your wound feels warm to the touch. °· You have pus coming from your wound. °· You continue to notice a bad smell coming from your wound or your bandage. °· Your wound that was closed breaks open. °Get help right away if: °· You have a red streak going away from your wound. °· You have a fever. °This information is not intended to replace advice given to you by your health care provider. Make sure you discuss any questions you have with your health care provider. °Document Released: 12/21/2007 Document Revised: 08/19/2015 Document Reviewed: 08/31/2014 °Elsevier Interactive Patient Education © 2017 [employer] ° °

## 2016-07-10 NOTE — Consult Note (Signed)
Consultation Note Date: 07/10/2016   Patient Name: Kara [name]  DOB: 09/07/1955  MRN: [id number]  Age / Sex: 61 y.o., female  PCP: Natalia Leatherwood, DO Referring Physician: Glade Lloyd, MD  Reason for Consultation: Pain control  HPI/Patient Profile: 61 y.o. female   admitted on 07/05/2016 with medical history significant of neck pain, fibromyalgia, history of opioid dependence, rheumatoid arthritis sent cervical fusion C5 C7 spinous process wiring done on 28 th of March 2018.  Patient reports multiple orthopedic surgeries over the past 5-10 years  Presented with severe pain all over but worse in her head and neck and low grade fevers at home 99.9 starting at 4 PM. She reports some drainage from the neck wound started after the operation  Patient states Percocet did not help her pain at home. Her pain has been getting worse. Denies any injury or falls. Worse pain in her neck. Describes pain as 10 out of 10. Intolerance worse with movements. Denies  bowel bladder incontinence or paresthesias or weakness.  Percocet does not help .  Found to be febrile in the emergency department up to 101.2 with leukocytosis up to 20 receptor postop for an infection  Admitted for SIRS with possible wound infection and pyuria.  Patient is faced with chronic pain management    Clinical Assessment and Goals of Care:  This NP Lorinda Creed reviewed medical records, received report from team, assessed the patient and then meet at the patient's bedside along with her daughter Elease Hashimoto  to discuss diagnosis, prognosis, GOC, EOL wishes disposition and options.  Conversation was had regarding her complicated pain history.    A  discussion was had today regarding importance of documentation of advanced directives advanced directives.    Values and goals of care important to patient and family were attempted to be elicited.  Concept  of  Palliative Care was discussed and its role in pain management.      Discussed in detaile the limitation of prescribing IP without adequate follow-up and importance of securing a pain management specialaist in the OP setting.  Detailed the importance of holistic approach and inclusion of medication, exercise  and CBT  Questions and concerns addressed.   Family encouraged to call with questions or concerns.    PMT will continue to support holistically.   PATIENT    SUMMARY OF RECOMMENDATIONS    Code Status/Advance Care Planning:  Full code   Symptom Management:   Dr Ophelia Charter tells me he will continue to work with this patient on her pain management needs in the OP setting  Palliative Prophylaxis:   Bowel Regimen-educated on the SE of constipation as it relates to opoids   Discharge Planning: Home with Home Health      Primary Diagnoses: Present on Admission: . Chronic pain . Fibromyalgia . Prediabetes . SIRS (systemic inflammatory response syndrome) (HCC) . Wound infection . Pyuria . Post-operative infection   I have reviewed the medical record, interviewed the patient and family, and examined the patient. The following aspects are  pertinent.  Past Medical History:  Diagnosis Date  . ADD (attention deficit disorder)    on Adderal  . Aggressive behavior of adult   . Allergy   . Anxiety   . Arthritis   . Asthma    as a child  . Cellulitis of breast 11/2013   RIGHT BREAST  . 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 Chronic kidney disease    due to infection from stone- no regular visits- fine now  . Chronic pain    goes to [facility] for pain control  . Cold sore   . 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 Depression    takes meds daily  . Family history of malignant neoplasm of breast   . Fibromyalgia   . Nephrolithiasis   . Osteoporosis   . Other specified rheumatoid arthritis,  right shoulder (HCC) 08/01/2011  . Post-nasal drip    hx of  . Rheumatoid arthritis(714.0)    autoimmune arthritis; methotrexate once/week  . Urinary incontinence    Social History   Social History  . Marital status: Married    Spouse name: N/A  . Number of children: N/A  . Years of education: N/A   Social History Main Topics  . Smoking status: Former Smoker    Packs/day: 0.50    Years: 10.00    Types: Cigarettes    Quit date: 12/06/2005  . Smokeless tobacco: Never Used     Comment: smoked for 10 years  . Alcohol use No     Comment: denied alcohol  . Drug use: No     Comment: denied any drug use with admission nurse  . Sexual activity: Yes    Birth control/ protection: Post-menopausal   Other Topics Concern  . None   Social History Narrative   Married, Morning Halberg.    6 children.    [employer], Retired Child psychotherapist.    Denies alcohol, tobacco or drug use.   Drinks caffeinated beverages. Uses herbal remedies. Take a daily vitamin.   Wears her seatbelt. Exercises greater than 3 times a week.   Smoke detector in the home, firearms in the home in a locked cabinet, feels safe in her relationships.   Family History  Problem Relation Age of Onset  . Arthritis Mother   . Heart disease Mother     ?psvt  . Breast cancer Mother 26    TAH/BSO  . Cancer Mother   . Mental illness Mother   . COPD Father   . Hypertension Father   . Alcohol abuse Father   . Mental illness Father   . Heart disease Father   . Healthy Daughter   . Breast cancer Maternal Aunt 27    deceased  . Cancer Cousin 33    female; unknown primary  . Colon cancer Paternal Aunt 57    deceased at 47  . Stomach cancer Paternal Uncle 69    deceased at 70  . Alcohol abuse Brother   . Cancer Brother   . Hodgkin's lymphoma Brother   . Mental illness Brother   . HIV Brother   . Healthy Brother   . Healthy Son    Scheduled Meds: . amphetamine-dextroamphetamine  60 mg Oral Daily  .  ceFAZolin (ANCEF) IV  2 g  Intravenous Q8H  . Chlorhexidine Gluconate Cloth  6 each Topical Daily  . enoxaparin (LOVENOX) injection  40 mg Subcutaneous Daily  .  insulin aspart  0-9 Units Subcutaneous Q4H  . mupirocin ointment  1 application Nasal BID  . rifampin  300 mg Oral Daily  . venlafaxine XR  150 mg Oral Q breakfast   Continuous Infusions: PRN Meds:.acetaminophen **OR** acetaminophen, ALPRAZolam, diphenhydrAMINE-zinc acetate, haloperidol lactate, HYDROmorphone (DILAUDID) injection, methocarbamol, metoCLOPramide **OR** metoCLOPramide (REGLAN) injection, ondansetron **OR** ondansetron (ZOFRAN) IV, oxyCODONE, sodium chloride flush, zolpidem Medications Prior to Admission:  Prior to Admission medications   Medication Sig Start Date End Date Taking? Authorizing Provider  ACTEMRA 162 MG/0.9ML SOSY Has stopped prior to procedure 04/11/16  Yes Historical Provider, MD  ADDERALL XR 30 MG 24 hr capsule Take 60 mg by mouth daily. Takes 2 capsules in the morning 11/02/15  Yes Historical Provider, MD  ALPRAZolam Prudy Feeler) 1 MG tablet Take 1 mg by mouth 3 (three) times daily as needed for anxiety.  07/06/14  Yes Historical Provider, MD  amphetamine-dextroamphetamine (ADDERALL) 30 MG tablet Take 30 mg by mouth daily. Before 4 pm 04/29/16  Yes Historical Provider, MD  cyclobenzaprine (FLEXERIL) 10 MG tablet TAKE 1 TABLET (10 MG TOTAL) BY MOUTH 3 (THREE) TIMES DAILY AS NEEDED FOR MUSCLE SPASMS. 02/11/16  Yes Eldred Manges, MD  diclofenac (VOLTAREN) 75 MG EC tablet Take 1 tablet (75 mg total) by mouth 2 (two) times daily. 05/02/16  Yes Eldred Manges, MD  Diclofenac Sodium (PENNSAID) 2 % SOLN Place 2 g onto the skin 4 (four) times daily as needed. Patient taking differently: Place 2 g onto the skin 2 (two) times daily as needed (joint pain).  02/24/16  Yes Tarry Kos, MD  FLUoxetine (PROZAC) 10 MG capsule Take 10 mg by mouth daily.   Yes Historical Provider, MD  methocarbamol (ROBAXIN) 500 MG tablet Take 1 tablet (500 mg total) by mouth 4  (four) times daily. Patient taking differently: Take 500 mg by mouth every 6 (six) hours as needed for muscle spasms.  06/22/16  Yes Eldred Manges, MD  oxyCODONE (OXY IR/ROXICODONE) 5 MG immediate release tablet Take 2 tablets (10 mg total) by mouth every 4 (four) hours as needed. Patient taking differently: Take 10 mg by mouth every 4 (four) hours as needed.  02/24/16  Yes Naiping Donnelly Stager, MD  oxyCODONE-acetaminophen (ROXICET) 5-325 MG tablet Take one tablet every 6-8 hours as needed for severe pain. 06/30/16  Yes Eldred Manges, MD  valACYclovir (VALTREX) 1000 MG tablet Take two tablets onset of coldsore and repeat 2 tablets in 12 hours Patient taking differently: Take 1,000 mg by mouth 2 (two) times daily as needed. Take two tablets onset of coldsore and repeat 2 tablets in 12 hours  01/13/16  Yes Renee A Kuneff, DO  venlafaxine XR (EFFEXOR-XR) 75 MG 24 hr capsule Take 150 mg by mouth daily with breakfast.    Yes Historical Provider, MD  zolpidem (AMBIEN) 10 MG tablet TAKE 1 TABLET (10 mg) BY MOUTH AT BEDTIME AS NEEDED for sleep 05/23/15  Yes Historical Provider, MD  oxyCODONE-acetaminophen (ROXICET) 5-325 MG tablet Take 1-2 tablets by mouth every 6 (six) hours as needed for severe pain. 07/10/16   Eldred Manges, MD   Allergies  Allergen Reactions  . Aspirin Anaphylaxis  . Ibuprofen Anaphylaxis  . Ativan [Lorazepam] Other (See Comments)    Makes agitated, combative   . Ketamine Other (See Comments)    Hallucinations  . Toradol [Ketorolac Tromethamine] Itching, Swelling and Rash    UNSPECIFIED REACTION   . Tramadol Hcl Itching, Swelling and Rash  SWELLING REACTION UNSPECIFIED  Tolerates Dilaudid 06/2016.  TDD.   Review of Systems  Musculoskeletal: Positive for back pain, myalgias and neck pain.  Neurological: Positive for weakness.  Psychiatric/Behavioral: Positive for agitation.    Physical Exam  Constitutional: She appears well-developed. She appears distressed.  Cardiovascular: Normal  rate, regular rhythm and normal heart sounds.   Pulmonary/Chest: Effort normal and breath sounds normal.  Skin: Skin is warm and dry.  Psychiatric: Her affect is angry and labile. She is agitated.    Vital Signs: BP (!) 141/78 (BP Location: Right Arm)   Pulse 79   Temp 99.1 F (37.3 C) (Oral)   Resp 17   Ht 5\' 4"  (1.626 m)   Wt 70.3 kg (155 lb)   SpO2 94%   BMI 26.61 kg/m  Pain Assessment: No/denies pain   Pain Score: Asleep   SpO2: SpO2: 94 % O2 Device:SpO2: 94 % O2 Flow Rate: .   IO: Intake/output summary:  Intake/Output Summary (Last 24 hours) at 07/10/16 1430 Last data filed at 07/10/16 0413  Gross per 24 hour  Intake              440 ml  Output             1000 ml  Net             -560 ml    LBM: Last BM Date: 07/06/16 Baseline Weight: Weight: 72.1 kg (159 lb) Most recent weight: Weight: 70.3 kg (155 lb)      Palliative Assessment/Data:   Discussed with Dr 09/05/16  Time In: 1245 Time Out: 1400 Time Total: 75 min Greater than 50%  of this time was spent counseling and coordinating care related to the above assessment and plan.  Signed by: [id number], NP   Please contact Palliative Medicine Team phone at [phone] for questions and concerns.  For individual provider: See [phone]

## 2016-07-10 NOTE — Progress Notes (Signed)
Patient ID: Kara [name], female   DOB: Apr 02, 1955, 61 y.o.   MRN: [id number]  PROGRESS NOTE    Brandon Scarbrough  NAT:[id number] DOB: [birth year] DOA: 07/05/2016 PCP: Felix Pacini, DO   Brief Narrative:  61 y.o.femalewith medical history significant of neck pain, fibromyalgia, history of opioid dependence, rheumatoid arthritis who recently had cervical fusion of C5-C7 spinous process wiringdone on 28 th of March 2018 presented with worsening pain, fever and drainage from the wound site. She was admitted with probable wound infection, SIRS and started on iv antibiotics. She had debridement of the wound done by orthopedics on 07/07/16 and currently has a wound vac. She has been intermittently very agitated, combative, pulling out her wound vac and iv lines few times. She has been evaluated by psychiatry as well. Care management is working on Home health arrangement.  Assessment & Plan:   Principal Problem:   Wound infection Active Problems:   Fibromyalgia   S/P cervical spinal fusion   Chronic pain   Prediabetes   SIRS (systemic inflammatory response syndrome) (HCC)   Pyuria   Post-operative infection   1. Wound infection: of the recent cervical fusion wound - Status post debridement of the wound done today by orthopedics on 07/07/16; currently with a wound vac. OR cultures growing MSSA. Currently on Ancef.  - Spoke with Dr. Ophelia Charter at bedside; he will speak to ID for consult and outpatient follow up -pain management: will be careful with opiates as patient has history of narcotic dependence. Monitor mental status and hemodynamics.  -follow AM labs -PT eval  2. Leukocytosis: probably due to #1; improved.  3. SIRS: secondary to wound infection   4. Chronic pain- difficult to manage history of opioid dependence and drug-seeking behavior - Discontinue Dilaudid; Use oxycodone PRN. Patient had agreed yesterday to discontinue Dilaudid. Today she is blaming me saying ' you discontinued the  dilaudid'. She gets intermittently very agitated and combative and very loud. Start Dilaudid 0.5mg  q6hours prn severe pain. Palliative care consult for help in the same. Outpatient pain management follow up.  5. Fibromyalgia:continue home medications  6. Prediabetes: monitor blood sugars; cover with insulinsliding scale  7. Pyuria: urine culture showing multiple species.   8. Extreme agitation, combativeness and hallucinations: questionable cause; may be related to delirium vs pain medication seeking behavior. Psychiatry consult appreciated. Psychiatry recommends to minimize opiates and anxiolytics. Patient will have to follow up with outpatient psychiatry regarding other med changes.   DVT prophylaxis:SCDs; lovenox Code Status: FULL CODE  Family Communication:none present Disposition Plan:Home care vs SNF once cleared by orthopedics Consultants:  Orthopedics: Dr. Karle Starch  Procedures:Wound debridement done on 07/07/2016  Antimicrobials: Vanc & Zosyn 07/05/16-07/09/16 Ancef 07/09/16>>  Subjective: Patient was seen and examined at bedside. She gets extremely agitated and loud intermittently. She is blaming me saying "you discontinued the dilaudid".  No overnight fever or vomiting.   Objective: Vitals:   07/08/16 2042 07/09/16 0455 07/09/16 1235 07/10/16 0411  BP: 104/68 102/64 (!) 146/72 (!) 141/78  Pulse: 72 74 88 79  Resp: 17 17 18 17   Temp:  98.3 F (36.8 C) 98.8 F (37.1 C) 99.1 F (37.3 C)  TempSrc: Oral Oral Oral Oral  SpO2: 95% 96% 92% 94%  Weight:      Height:        Intake/Output Summary (Last 24 hours) at 07/10/16 1214 Last data filed at 07/10/16 0413  Gross per 24 hour  Intake  440 ml  Output             1000 ml  Net             -560 ml   Filed Weights   07/05/16 1652 07/06/16 0033  Weight: 72.1 kg (159 lb) 70.3 kg (155 lb)    Examination:  General exam: gets intermittently agitated and loud; alert and  awake Respiratory system: Clear to auscultation. Respiratory effort normal. Cardiovascular system:S1 &S2 heard, RRR. Gastrointestinal system:Abdomen is nondistended, soft and nontender. No organomegaly or masses felt. Normal bowel sounds heard. Extremities: no cyanosis, clubbing, edema  Data Reviewed: I have personally reviewed following labs and imaging studies  CBC:  Recent Labs Lab 07/05/16 1817 07/07/16 0513 07/08/16 0337 07/09/16 0435 07/10/16 0423  WBC 20.0* 10.8* 7.4 7.3 6.9  NEUTROABS 17.3* 7.4 4.7 4.6 4.5  HGB 12.4 10.4* 9.5* 9.6* 11.2*  HCT 38.0 33.4* 29.7* 30.3* 35.1*  MCV 87.0 88.6 88.4 86.1 86.0  PLT 341 298 336 369 490*   Basic Metabolic Panel:  Recent Labs Lab 07/05/16 1817 07/07/16 0513 07/08/16 0337 07/09/16 0435 07/10/16 0423  NA 134* 138 136 139 140  K 4.3 4.3 3.8 3.6 3.3*  CL 102 106 101 103 101  CO2 22 25 25 26 27   GLUCOSE 116* 102* 112* 101* 145*  BUN 23* 6 5* 5* <5*  CREATININE 0.84 0.74 0.86 1.16* 1.24*  CALCIUM 9.1 8.7* 9.1 9.2 9.1  MG  --   --  2.1  --  2.2   GFR: Estimated Creatinine Clearance: 47 mL/min (A) (by C-G formula based on SCr of 1.24 mg/dL (H)). Liver Function Tests:  Recent Labs Lab 07/05/16 1817  AST 29  ALT 39  ALKPHOS 115  BILITOT 0.5  PROT 6.6  ALBUMIN 3.6   No results for input(s): LIPASE, AMYLASE in the last 168 hours. No results for input(s): AMMONIA in the last 168 hours. Coagulation Profile: No results for input(s): INR, PROTIME in the last 168 hours. Cardiac Enzymes: No results for input(s): CKTOTAL, CKMB, CKMBINDEX, TROPONINI in the last 168 hours. BNP (last 3 results) No results for input(s): PROBNP in the last 8760 hours. HbA1C: No results for input(s): HGBA1C in the last 72 hours. CBG:  Recent Labs Lab 07/09/16 0811 07/09/16 1235 07/09/16 2357 07/10/16 0406 07/10/16 1155  GLUCAP 93 134* 93 89 170*   Lipid Profile: No results for input(s): CHOL, HDL, LDLCALC, TRIG, CHOLHDL, LDLDIRECT  in the last 72 hours. Thyroid Function Tests: No results for input(s): TSH, T4TOTAL, FREET4, T3FREE, THYROIDAB in the last 72 hours. Anemia Panel: No results for input(s): VITAMINB12, FOLATE, FERRITIN, TIBC, IRON, RETICCTPCT in the last 72 hours. Sepsis Labs:  Recent Labs Lab 07/05/16 1949 07/05/16 2219  LATICACIDVEN 0.73 0.96    Recent Results (from the past 240 hour(s))  Blood culture (routine x 2)     Status: None (Preliminary result)   Collection Time: 07/05/16  7:30 PM  Result Value Ref Range Status   Specimen Description BLOOD RIGHT ANTECUBITAL  Final   Special Requests   Final    BOTTLES DRAWN AEROBIC AND ANAEROBIC Blood Culture adequate volume   Culture NO GROWTH 4 DAYS  Final   Report Status PENDING  Incomplete  Blood culture (routine x 2)     Status: None (Preliminary result)   Collection Time: 07/05/16  7:40 PM  Result Value Ref Range Status   Specimen Description BLOOD LEFT ANTECUBITAL  Final   Special Requests BOTTLES DRAWN AEROBIC  AND ANAEROBIC 5CC  Final   Culture NO GROWTH 4 DAYS  Final   Report Status PENDING  Incomplete  Urine culture     Status: Abnormal   Collection Time: 07/05/16  9:56 PM  Result Value Ref Range Status   Specimen Description URINE, CLEAN CATCH  Final   Special Requests NONE  Final   Culture MULTIPLE SPECIES PRESENT, SUGGEST RECOLLECTION (A)  Final   Report Status 07/07/2016 FINAL  Final  Respiratory Panel by PCR     Status: None   Collection Time: 07/06/16 12:31 AM  Result Value Ref Range Status   Adenovirus NOT DETECTED NOT DETECTED Final   Coronavirus 229E NOT DETECTED NOT DETECTED Final   Coronavirus HKU1 NOT DETECTED NOT DETECTED Final   Coronavirus NL63 NOT DETECTED NOT DETECTED Final   Coronavirus OC43 NOT DETECTED NOT DETECTED Final   Metapneumovirus NOT DETECTED NOT DETECTED Final   Rhinovirus / Enterovirus NOT DETECTED NOT DETECTED Final   Influenza A NOT DETECTED NOT DETECTED Final   Influenza B NOT DETECTED NOT DETECTED  Final   Parainfluenza Virus 1 NOT DETECTED NOT DETECTED Final   Parainfluenza Virus 2 NOT DETECTED NOT DETECTED Final   Parainfluenza Virus 3 NOT DETECTED NOT DETECTED Final   Parainfluenza Virus 4 NOT DETECTED NOT DETECTED Final   Respiratory Syncytial Virus NOT DETECTED NOT DETECTED Final   Bordetella pertussis NOT DETECTED NOT DETECTED Final   Chlamydophila pneumoniae NOT DETECTED NOT DETECTED Final   Mycoplasma pneumoniae NOT DETECTED NOT DETECTED Final  Surgical pcr screen     Status: Abnormal   Collection Time: 07/06/16  8:24 AM  Result Value Ref Range Status   MRSA, PCR NEGATIVE NEGATIVE Final   Staphylococcus aureus POSITIVE (A) NEGATIVE Final    Comment:        The Xpert SA Assay (FDA approved for NASAL specimens in patients over 2 years of age), is one component of a comprehensive surveillance program.  Test performance has been validated by [facility] for patients greater than or equal to 86 year old. It is not intended to diagnose infection nor to guide or monitor treatment.   Aerobic Culture (superficial specimen)     Status: None   Collection Time: 07/06/16  8:29 AM  Result Value Ref Range Status   Specimen Description ABSCESS NECK  Final   Special Requests POSTERIOR NECK INCISION  Final   Gram Stain   Final    RARE WBC PRESENT, PREDOMINANTLY PMN RARE GRAM POSITIVE COCCI IN SINGLES    Culture FEW STAPHYLOCOCCUS AUREUS  Final   Report Status 07/08/2016 FINAL  Final   Organism ID, Bacteria STAPHYLOCOCCUS AUREUS  Final      Susceptibility   Staphylococcus aureus - MIC*    CIPROFLOXACIN <=0.5 SENSITIVE Sensitive     ERYTHROMYCIN <=0.25 SENSITIVE Sensitive     GENTAMICIN <=0.5 SENSITIVE Sensitive     OXACILLIN 0.5 SENSITIVE Sensitive     TETRACYCLINE <=1 SENSITIVE Sensitive     VANCOMYCIN <=0.5 SENSITIVE Sensitive     TRIMETH/SULFA <=10 SENSITIVE Sensitive     CLINDAMYCIN <=0.25 SENSITIVE Sensitive     RIFAMPIN <=0.5 SENSITIVE Sensitive     Inducible  Clindamycin NEGATIVE Sensitive     * FEW STAPHYLOCOCCUS AUREUS  Aerobic/Anaerobic Culture (surgical/deep wound)     Status: None (Preliminary result)   Collection Time: 07/07/16  8:09 AM  Result Value Ref Range Status   Specimen Description WOUND NECK  Final   Special Requests POSTERIOR  Final  Gram Stain   Final    RARE WBC PRESENT, PREDOMINANTLY PMN RARE GRAM POSITIVE COCCI IN PAIRS    Culture   Final    MODERATE STAPHYLOCOCCUS AUREUS SUSCEPTIBILITIES PERFORMED ON PREVIOUS CULTURE WITHIN THE LAST 5 DAYS. NO ANAEROBES ISOLATED; CULTURE IN PROGRESS FOR 5 DAYS    Report Status PENDING  Incomplete         Radiology Studies: No results found.      Scheduled Meds: . amphetamine-dextroamphetamine  60 mg Oral Daily  .  ceFAZolin (ANCEF) IV  2 g Intravenous Q8H  . Chlorhexidine Gluconate Cloth  6 each Topical Daily  . enoxaparin (LOVENOX) injection  40 mg Subcutaneous Daily  . insulin aspart  0-9 Units Subcutaneous Q4H  . mupirocin ointment  1 application Nasal BID  . venlafaxine XR  150 mg Oral Q breakfast   Continuous Infusions:   LOS: 4 days        Glade Lloyd, MD [facility] Pager [phone]  If 7PM-7AM, please contact night-coverage [email or web address] Password [facility] 07/10/2016, 12:14 PM

## 2016-07-10 NOTE — Progress Notes (Addendum)
   Subjective: 3 Days Post-Op Procedure(s) (LRB): IRRIGATION AND DEBRIDEMENT POSTERIOR NECK (N/A) VAC change done by Vac RN Dawn. Patient had dilaudid decreased to 0.5 mg . Hospitalist at bedside with me and yesterday patient told him that the dose was so low that he might as well stop it and dilaudid was discontinued. Percocet given before VAC change. Patient yelling for Dilaudid, screaming at doctors and nurses.   Cultures growing MSSA. Infectious disease consult called, will need several weeks IV coverage and VAC changes for several weeks.   Objective: Vital signs in last 24 hours: Temp:  [98.8 F (37.1 C)-99.1 F (37.3 C)] 99.1 F (37.3 C) (04/16 0411) Pulse Rate:  [79-88] 79 (04/16 0411) Resp:  [17-18] 17 (04/16 0411) BP: (141-146)/(72-78) 141/78 (04/16 0411) SpO2:  [92 %-94 %] 94 % (04/16 0411)  Intake/Output from previous day: 04/15 0701 - 04/16 0700 In: 662 [P.O.:662] Out: 2600 [Urine:2600] Intake/Output this shift: No intake/output data recorded.   Recent Labs  07/08/16 0337 07/09/16 0435 07/10/16 0423  HGB 9.5* 9.6* 11.2*    Recent Labs  07/09/16 0435 07/10/16 0423  WBC 7.3 6.9  RBC 3.52* 4.08  HCT 30.3* 35.1*  PLT 369 490*    Recent Labs  07/09/16 0435 07/10/16 0423  NA 139 140  K 3.6 3.3*  CL 103 101  CO2 26 27  BUN 5* <5*  CREATININE 1.16* 1.24*  GLUCOSE 101* 145*  CALCIUM 9.2 9.1   No results for input(s): LABPT, INR in the last 72 hours.  Neurologically intact.   VAC changed no bleeding , some absorbable antiobiotic beads came out with VAC sponge.  No results found.  Assessment/Plan: 3 Days Post-Op Procedure(s) (LRB): IRRIGATION AND DEBRIDEMENT POSTERIOR NECK (N/A) Plan:   Patient wants to go home ASAP . Will arrange home IV ABX , ID consult pending.  Office followup with me in 2 wks.  I explained to patient that when she yells , screams, curse words at staff then she is less likely to get narcotic meds and that Psych had recommended  decreasing narcotics due to delirium .  She said " OK I will be a good girl " .  ID team to choose ABX, Cefadyl maybe better choice than Ancef due to longer half life , fewer doses per day, etc.  Patient has a trip to [place] June 6th she wants to make.   [facility] cell [phone] Eldred Manges 07/10/2016, 9:02 AM

## 2016-07-10 NOTE — Progress Notes (Signed)
Peripherally Inserted Central Catheter/Midline Placement  The IV Nurse has discussed with the patient and/or persons authorized to consent for the patient, the purpose of this procedure and the potential benefits and risks involved with this procedure.  The benefits include less needle sticks, lab draws from the catheter, and the patient may be discharged home with the catheter. Risks include, but not limited to, infection, bleeding, blood clot (thrombus formation), and puncture of an artery; nerve damage and irregular heartbeat and possibility to perform a PICC exchange if needed/ordered by physician.  Alternatives to this procedure were also discussed.  Bard Power PICC patient education guide, fact sheet on infection prevention and patient information card has been provided to patient /or left at bedside.    PICC/Midline Placement Documentation  PICC Single Lumen 07/10/16 PICC Right Basilic 37 cm 1 cm (Active)  Indication for Insertion or Continuance of Line Home intravenous therapies (PICC only) 07/10/2016  2:26 PM  Exposed Catheter (cm) 1 cm 07/10/2016  2:26 PM  Site Assessment Clean;Dry;Intact 07/10/2016  2:26 PM  Line Status Flushed;Saline locked;Blood return noted 07/10/2016  2:26 PM  Dressing Type Transparent;Securing device 07/10/2016  2:26 PM  Dressing Status Clean;Dry;Intact;Antimicrobial disc in place 07/10/2016  2:26 PM  Dressing Change Due 07/17/16 07/10/2016  2:26 PM       Romie Jumper 07/10/2016, 2:29 PM

## 2016-07-10 NOTE — Care Management Note (Addendum)
Case Management Note  Patient Details  Name: [name] MRN: [id number] Date of Birth: [birth year]  Subjective/Objective:                    Action/Plan:  [employer] VAC to be released today will be delivered to hospital room this afternoon by 1700. Confirmed face sheet information with patient .  Ordered walker and 3 in 1 Explained home health IV ABX/PICC and VAC .   Patient will have a nurse to change VAC three times a week. Patient and husband will be taught IV ABX. Patient voiced understanding and agreeable.  Patient offered choice and wants [facility]. Will make referral.  Will fax [employer] form.  Expected Discharge Date:                  Expected Discharge Plan:  Home w Home Health Services  In-House Referral:     Discharge planning Services  CM Consult  Post Acute Care Choice:  Home Health, Durable Medical Equipment Choice offered to:  Patient  DME Arranged:  Vac DME Agency:  [facility]  HH Arranged:  RN, PT HH Agency:     Status of Service:  In process, will continue to follow  If discussed at Long Length of Stay Meetings, dates discussed:    Additional Comments:  Kingsley Plan, RN 07/10/2016, 9:57 AM

## 2016-07-10 NOTE — Telephone Encounter (Signed)
IC and s/w Heather and she has the info she needs.

## 2016-07-10 NOTE — Progress Notes (Signed)
Occupational Therapy Progress Note  Pt eager to discharge home.  She is able to perform ADLs at supervision level, but is impulsive with poor safety awareness.  Spouse will be able to provide supervision.  Education completed.  Recommend 24 hour supervision at discharge.    07/10/16 1600  OT Visit Information  Last OT Received On 07/10/16  Assistance Needed +1  History of Present Illness Pt is 61 y/o female presenting with surgical infection from C5-C7 fusion on 06/21/16. PMH includes fibromyalgia, RA, CKD, opiod dependence, and has had previous R shoulder and back surgeries.   Precautions  Precautions Cervical  Precaution Comments reviewed cervical precautions   Required Braces or Orthoses Cervical Brace  Cervical Brace Soft collar  Pain Assessment  Pain Assessment Faces  Faces Pain Scale 4  Pain Location neck   Pain Descriptors / Indicators Constant;Grimacing;Discomfort  Pain Intervention(s) Monitored during session  Cognition  Arousal/Alertness Awake/alert  Behavior During Therapy WFL for tasks assessed/performed;Impulsive  Overall Cognitive Status Within Functional Limits for tasks assessed  General Comments Appears to be at her normal.  Pt is irritable, fixates on pain medication issues, and frequently self distracts   ADL  Overall ADL's  Needs assistance/impaired  Grooming Details (indicate cue type and reason) reviewed safe technique to avoid bending   Lower Body Dressing Supervision/safety;Sit to/from stand  Lower Body Dressing Details (indicate cue type and reason) reviewed safe technique  Toilet Transfer Supervision/safety;Ambulation;Comfort height toilet;Grab bars  Toileting- Clothing Manipulation and Hygiene Supervision/safety;Sit to/from stand  Functional mobility during ADLs Supervision/safety  General ADL Comments recommended that pt sit to shower.  Spouse agreed   Balance  Overall balance assessment Needs assistance  Sitting-balance support No upper extremity  supported;Feet supported  Sitting balance-Leahy Scale Fair  Standing balance support No upper extremity supported;During functional activity  Standing balance-Leahy Scale Fair  Transfers  Overall transfer level Needs assistance  Equipment used None  Transfers Sit to/from Stand;Stand Pivot Transfers  Sit to Stand Supervision  Stand pivot transfers Supervision  General Comments  General comments (skin integrity, edema, etc.) spouse present   OT - End of Session  Equipment Utilized During Treatment Cervical collar  Activity Tolerance Patient tolerated treatment well  Patient left Other (comment) (with PT )  OT Assessment/Plan  OT Plan Discharge plan remains appropriate  OT Visit Diagnosis Unsteadiness on feet (R26.81);Muscle weakness (generalized) (M62.81);Pain  OT Frequency (ACUTE ONLY) Min 2X/week  Follow Up Recommendations Supervision/Assistance - 24 hour;No OT follow up  OT Equipment None recommended by OT  AM-PAC OT "6 Clicks" Daily Activity Outcome Measure  Help from another person eating meals? 4  Help from another person taking care of personal grooming? 3  Help from another person toileting, which includes using toliet, bedpan, or urinal? 3  Help from another person bathing (including washing, rinsing, drying)? 3  Help from another person to put on and taking off regular upper body clothing? 3  Help from another person to put on and taking off regular lower body clothing? 3  6 Click Score 19  ADL G Code Conversion CK  OT Goal Progression  Progress towards OT goals Progressing toward goals  OT Time Calculation  OT Start Time (ACUTE ONLY) 1459  OT Stop Time (ACUTE ONLY) 1511  OT Time Calculation (min) 12 min  OT General Charges  $OT Visit 1 Procedure  OT Treatments  $Self Care/Home Management  8-22 mins  [employer], OTR/L [phone]

## 2016-07-10 NOTE — Telephone Encounter (Signed)
Forms are signed. I talked with Herbert Seta where 6N staff in progression. Plan home on Picc line ABX and VAC changes.

## 2016-07-10 NOTE — Telephone Encounter (Signed)
I was talking to husband at same time daughter called, she did not know it and they said they are good. FYI

## 2016-07-10 NOTE — Progress Notes (Signed)
Physical Therapy Treatment Patient Details Name: Kara [name] MRN: [id number] DOB: October 28, 1955 Today's Date: 07/10/2016    History of Present Illness Pt is 61 y/o female presenting with surgical infection from C5-C7 fusion on 06/21/16. PMH includes fibromyalgia, RA, CKD, opiod dependence, and has had previous R shoulder and back surgeries.     PT Comments    Pt is making progress towards her goals. Pt is currently limited by her decreased safety awareness especially in terms of her wound vac and IV lines. Pt is supervision for transfers with RW and min guard for ambulation of 510 feet with RW. Pt requires skilled PT to progress gait training and to improve safety awareness to be safe in her home environment.     Follow Up Recommendations  Home health PT;Supervision/Assistance - 24 hour     Equipment Recommendations  Rolling walker with 5" wheels;3in1 (PT)    Recommendations for Other Services       Precautions / Restrictions Precautions Precautions: Cervical Precaution Comments: reviewed cervical precautions  Required Braces or Orthoses: Cervical Brace Cervical Brace: Soft collar Restrictions Weight Bearing Restrictions: No    Mobility  Bed Mobility               General bed mobility comments: met pt in hallway with OT  Transfers Overall transfer level: Needs assistance Equipment used: None Transfers: Sit to/from Stand;Stand Pivot Transfers Sit to Stand: Supervision Stand pivot transfers: Supervision       General transfer comment: Pt requires vc to watch for vac tubing and IV to not get tangled with transfering into and out of recliner   Ambulation/Gait Ambulation/Gait assistance: Min guard Ambulation Distance (Feet): 510 Feet Assistive device: None Gait Pattern/deviations: Decreased step length - right;Decreased step length - left;Drifts right/left Gait velocity: Decreased Gait velocity interpretation: Below normal speed for age/gender General Gait Details:  Pt ambulated without AD with PT pushing IV pole. Pt drifting right and left with gaze as she has to turn her entire torso due to cervical collar. Pt with decreased safety awareness.        Balance Overall balance assessment: Needs assistance Sitting-balance support: No upper extremity supported;Feet supported Sitting balance-Leahy Scale: Fair     Standing balance support: No upper extremity supported;During functional activity Standing balance-Leahy Scale: Fair Standing balance comment: pt able to turn and get items off the bed outside her base of support                            Cognition Arousal/Alertness: Awake/alert Behavior During Therapy: [name] for tasks assessed/performed;Impulsive Overall Cognitive Status: Within Functional Limits for tasks assessed                                 General Comments: Appears to be at her normal.  Pt is irritable, fixates on pain medication issues, and frequently self distracts          General Comments General comments (skin integrity, edema, etc.): spouse present       Pertinent Vitals/Pain Pain Assessment: Faces Faces Pain Scale: Hurts little more Pain Location: neck  Pain Descriptors / Indicators: Constant;Grimacing;Discomfort Pain Intervention(s): Monitored during session  VSS           PT Goals (current goals can now be found in the care plan section) Acute Rehab PT Goals Patient Stated Goal: Go home PT Goal Formulation: With patient Potential to  Achieve Goals: Fair Progress towards PT goals: Progressing toward goals    Frequency    Min 3X/week      PT Plan Current plan remains appropriate       End of Session   Activity Tolerance: Patient tolerated treatment well Patient left: in chair;with call bell/phone within reach (DME rep in room ) Nurse Communication: Mobility status PT Visit Diagnosis: Muscle weakness (generalized) (M62.81);Unsteadiness on feet (R26.81) Pain - part of body:   (cervical spine)     Time: 9471-2527 PT Time Calculation (min) (ACUTE ONLY): 8 min  Charges:  $Gait Training: 8-22 mins                    G Codes:       Kara [name] B. Migdalia Dk PT, DPT Acute Rehabilitation  [phone] Pager [phone]     [place] 07/10/2016, 4:13 PM

## 2016-07-11 ENCOUNTER — Telehealth: Payer: Self-pay

## 2016-07-11 ENCOUNTER — Telehealth (INDEPENDENT_AMBULATORY_CARE_PROVIDER_SITE_OTHER): Payer: Self-pay | Admitting: Orthopaedic Surgery

## 2016-07-11 NOTE — Telephone Encounter (Signed)
Pt husband called to ask if we can send authorization for [facility] to take out pt wound vac. He  stated she is screaming non stop and it is her wish to take it out.  Please advise.  [phone] or [phone]

## 2016-07-11 NOTE — Telephone Encounter (Signed)
I talked with patient explained her that this is necessary for her successful healing and to avoid deep bone infection if she is noncompliant with past history of psychiatric problems she may have to be admitted to the psychiatric ward to help with compliance and appropriate healing. She understands and agrees to follow my recommendations

## 2016-07-11 NOTE — Telephone Encounter (Signed)
noted 

## 2016-07-11 NOTE — Telephone Encounter (Signed)
Per Dr. Ophelia Charter, wound vac changes 3x per week. It is on 100 continuous.   I left voicemail for [place] advising.

## 2016-07-11 NOTE — Telephone Encounter (Signed)
[name] is now asking for wound vac to be removed. Would you like for me to call and discuss with him again?

## 2016-07-11 NOTE — Telephone Encounter (Signed)
Spoke with husband, patient unavailable.   Transition Care Management Follow-up Telephone Call   Date discharged? 07/10/16   How have you been since you were released from the hospital? "she's in a lot of pain"   Do you understand why you were in the hospital? yes, "infection at infusion site"   Do you understand the discharge instructions? yes   Where were you discharged to? Home. Home Health nurse has supervised husband giving antibiotic via PICC line.    Items Reviewed:  Medications reviewed: no, husband did not have list or meds at time of call  Allergies reviewed: states no changes  Dietary changes reviewed: no changes  Referrals reviewed: yes. Encouraged to call surgeon to schedule f/u appointment and discuss pain control.    Functional Questionnaire:   Activities of Daily Living (ADLs):   She states they are independent in the following: ambulation, bathing and hygiene, feeding, continence, grooming, toileting and dressing. Husband is present if assistance is needed.  States they require assistance with the following: none   Any transportation issues/concerns?: no   Any patient concerns? no   Confirmed importance and date/time of follow-up visits scheduled yes  Provider Appointment booked with Dr. Claiborne Billings on Monday, 07/17/16 @ 10am.   Confirmed with patient if condition begins to worsen call PCP or go to the ER.  Patient was given the office number and encouraged to call back with question or concerns.  : yes

## 2016-07-11 NOTE — Telephone Encounter (Signed)
[facility] nurse requested wound care orders for pt.  [phone] [facility]

## 2016-07-12 ENCOUNTER — Ambulatory Visit (INDEPENDENT_AMBULATORY_CARE_PROVIDER_SITE_OTHER): Payer: Self-pay | Admitting: Orthopaedic Surgery

## 2016-07-12 ENCOUNTER — Encounter (INDEPENDENT_AMBULATORY_CARE_PROVIDER_SITE_OTHER): Payer: Self-pay | Admitting: Orthopaedic Surgery

## 2016-07-12 VITALS — BP 171/98 | HR 70

## 2016-07-12 DIAGNOSIS — T8140XD Infection following a procedure, unspecified, subsequent encounter: Secondary | ICD-10-CM

## 2016-07-12 DIAGNOSIS — T814XXD Infection following a procedure, subsequent encounter: Secondary | ICD-10-CM

## 2016-07-12 LAB — AEROBIC/ANAEROBIC CULTURE (SURGICAL/DEEP WOUND)

## 2016-07-12 LAB — AEROBIC/ANAEROBIC CULTURE W GRAM STAIN (SURGICAL/DEEP WOUND)

## 2016-07-12 NOTE — Progress Notes (Signed)
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 I've talked to her on the phone about her back and she has been compliant and has agreed to leave the [facility] on. I discussed with her that she will heal quicker and is less likely to end up with deep bone infection with appropriate treatment. She understands that if she is not compliant and follow instructions she may end up with the bone infection and potentially have need for many more surgeries with poor outcome. She took 2 tablets of the hand milligram Percocet when she had her dressing change with the VAC. She will return next week and will schedule so that I can see her with removal of the [facility] and then she can go home and have the [facility] nurse applied.  Chief Complaint:  Chief Complaint  Patient presents with  . Neck - Routine Post Op   Visit Diagnoses:  1. Postoperative infection, subsequent encounter     Plan:Patient will return in about a week so I can remove the VAC check the wound and then she can go back home with a wet-to-dry dressing on a negative VAC placed by the home nurse.  Follow-Up Instructions: Patient will return in about a week for [facility] removal and then she can go home and have the [facility] nurse replaced the VAC. She continues on her Ancef and also on rifampin. She's having some nausea problems.  Orders:  No orders of the defined types were placed in this encounter.  No orders of the defined types were placed in this encounter.   Imaging: No results found.  PMFS History: Patient Active Problem List   Diagnosis Date Noted  . Post-operative infection 07/06/2016  . SIRS (systemic inflammatory response syndrome) (HCC) 07/05/2016  . Wound infection 07/05/2016  . Pyuria 07/05/2016  . Cervical pseudoarthrosis (HCC)  06/21/2016  . Opioid dependence in remission (HCC) 06/14/2016  . Mitral valve prolapse 01/04/2016  . Skin infection 12/22/2015  . History of recent hospitalization 12/22/2015  . Altered mental status 12/13/2015  . HNP (herniated nucleus pulposus), lumbar 12/10/2015  . Prediabetes 08/19/2015  . BMI 26.0-26.9,adult 08/18/2015  . Chronic pain 08/18/2015  . Vitamin D deficiency 08/18/2015  . Atypical chest pain 08/18/2015  . Elevated BP 08/18/2015  . Chronic fatigue disorder 06/14/2015  . Nail fungus 06/14/2015  . S/P cervical spinal fusion 03/15/2015  . Spinal surgery in prior 3 months 07/03/2014  . PTSD (post-traumatic stress disorder) 03/07/2014  . Severe recurrent major depression without psychotic features (HCC) 03/06/2014  . Suicide threat or attempt 03/06/2014  . Agitation 03/06/2014  . Aggressive behavior   . Family history of malignant neoplasm of breast   . Rheumatoid arthritis (HCC) 12/13/2011  . Fibromyalgia 12/13/2011   Past Medical History:  Diagnosis Date  . ADD (attention deficit disorder)    on Adderal  . Aggressive behavior of adult   . Allergy   . Anxiety   . Arthritis   . Asthma    as a child  . Cellulitis of breast 11/2013   RIGHT BREAST  . 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 Chronic kidney disease    due to infection from  stone- no regular visits- fine now  . Chronic pain    goes to [facility] for pain control  . Cold sore   . 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 Depression    takes meds daily  . Family history of malignant neoplasm of breast   . Fibromyalgia   . Nephrolithiasis   . Osteoporosis   . Other specified rheumatoid arthritis, right shoulder (HCC) 08/01/2011  . Post-nasal drip    hx of  . Rheumatoid arthritis(714.0)    autoimmune arthritis; methotrexate once/week  . Urinary incontinence     Family History  Problem Relation Age of Onset  . Arthritis Mother     . Heart disease Mother     ?psvt  . Breast cancer Mother 73    TAH/BSO  . Cancer Mother   . Mental illness Mother   . COPD Father   . Hypertension Father   . Alcohol abuse Father   . Mental illness Father   . Heart disease Father   . Healthy Daughter   . Breast cancer Maternal Aunt 27    deceased  . Cancer Cousin 38    female; unknown primary  . Colon cancer Paternal Aunt 62    deceased at 42  . Stomach cancer Paternal Uncle 66    deceased at 40  . Alcohol abuse Brother   . Cancer Brother   . Hodgkin's lymphoma Brother   . Mental illness Brother   . HIV Brother   . Healthy Brother   . Healthy Son     Past Surgical History:  Procedure Laterality Date  . ANTERIOR CERVICAL DECOMP/DISCECTOMY FUSION N/A 03/15/2015   Procedure: Cervical five-six, Cerival six-seven, Anterior Cervical Discectomy and Fusion, Allograft and Plate;  Surgeon: Eldred Manges, MD;  Location: [facility] OR;  Service: Orthopedics;  Laterality: N/A;  . BACK SURGERY  2011,16   lower back fusion l4-5, s1  . BLADDER SURGERY  2009  . BREAST ENHANCEMENT SURGERY  2003  . breast implants removed    . BREAST SURGERY  10/2013   REMOVAL OF BREAST IMPLANTS  . CERVICAL WOUND DEBRIDEMENT N/A 07/07/2016   Procedure: IRRIGATION AND DEBRIDEMENT POSTERIOR NECK;  Surgeon: Eldred Manges, MD;  Location: [facility] OR;  Service: Orthopedics;  Laterality: N/A;  . INCISION AND DRAINAGE ABSCESS Right 01/16/2014   Procedure: INCISION AND DRAINAGE AND OF RIGHT BREAST ABCESS;  Surgeon: Glenna Fellows, MD;  Location: [facility] ORS;  Service: General;  Laterality: Right;  . JOINT REPLACEMENT    . LIPOSUCTION  2003   abdominal  . LUMBAR LAMINECTOMY/DECOMPRESSION MICRODISCECTOMY N/A 12/10/2015   Procedure: Right L3-4 Hemilaminectomy, Excision of herniated nucleus pulposus;  Surgeon: Eldred Manges, MD;  Location: [facility] OR;  Service: Orthopedics;  Laterality: N/A;  . MASS EXCISION  11/03/2011   Procedure: MINOR EXCISION OF MASS;  Surgeon: Wyn Forster., MD;   Location: [facility];  Service: Orthopedics;  Laterality: Left;  debride IP joint, cyst excision left index  . POSTERIOR CERVICAL FUSION/FORAMINOTOMY N/A 06/21/2016   Procedure: POSTERIOR CERVICAL FUSION C5-C7 SPINOUS PROCESS WIRING;  Surgeon: Eldred Manges, MD;  Location: [facility] OR;  Service: Orthopedics;  Laterality: N/A;  . POSTERIOR FUSION CERVICAL SPINE  06/21/2016   C5 C7  . SHOULDER SURGERY  2012   right  . TONSILLECTOMY  1987  . TOTAL SHOULDER ARTHROPLASTY  08/01/2011   Procedure: TOTAL SHOULDER ARTHROPLASTY;  Surgeon: Burtis Junes  Dion Saucier, MD;  Location: [facility] OR;  Service: Orthopedics;  Laterality: Right;  Right total shoulder arthroplasty  . TUBAL LIGATION  1988   Social History   Occupational History  . Not on file.   Social History Main Topics  . Smoking status: Former Smoker    Packs/day: 0.50    Years: 10.00    Types: Cigarettes    Quit date: 12/06/2005  . Smokeless tobacco: Never Used     Comment: smoked for 10 years  . Alcohol use No     Comment: denied alcohol  . Drug use: No     Comment: denied any drug use with admission nurse  . Sexual activity: Yes    Birth control/ protection: Post-menopausal

## 2016-07-14 ENCOUNTER — Telehealth (INDEPENDENT_AMBULATORY_CARE_PROVIDER_SITE_OTHER): Payer: Self-pay | Admitting: Orthopaedic Surgery

## 2016-07-14 ENCOUNTER — Telehealth: Payer: Self-pay | Admitting: *Deleted

## 2016-07-14 NOTE — Telephone Encounter (Signed)
Spoke with patient reviewed information she states she will call the surgeons office.

## 2016-07-14 NOTE — Telephone Encounter (Signed)
Call received from [employer] Case manager she states patient recently had surgery and currently has a wound vac. She states the surgeon is out of town and patient is about to run out of percocet and they are requesting a Rx for percocet to get patient through the weekend. I asked her who was on call for the surgeon she stated they are being conservative and didn't refill it but she feels the patient does need it for the pain. Patient has appt here Monday. Please advise.

## 2016-07-14 NOTE — Telephone Encounter (Signed)
Returned call to patient left message to return call   [phone]

## 2016-07-14 NOTE — Telephone Encounter (Signed)
I am sorry pt is in pain. I unfortunately can not fill a controlled substance for this patient over the phone without seeing her, it is illegal. She would need to contact the on call surgeon/coverage for Dr. Ophelia Charter to address post-operative pain.

## 2016-07-14 NOTE — Telephone Encounter (Signed)
Patient called advised she has only 1 tab left of her pain medication. Advised patient there is not a physician in the office this afternoon who can address her request. The  Number to contact patient is [phone]

## 2016-07-17 ENCOUNTER — Ambulatory Visit: Payer: Self-pay | Admitting: Family Medicine

## 2016-07-17 ENCOUNTER — Telehealth: Payer: Self-pay | Admitting: *Deleted

## 2016-07-17 ENCOUNTER — Encounter: Payer: Self-pay | Admitting: Infectious Diseases

## 2016-07-17 MED ORDER — OXYCODONE-ACETAMINOPHEN 5-325 MG PO TABS: 1.0000 | ORAL_TABLET | Freq: Three times a day (TID) | ORAL | 0 refills | Status: DC | PRN

## 2016-07-17 NOTE — Telephone Encounter (Signed)
Please advise 

## 2016-07-17 NOTE — Telephone Encounter (Signed)
Script printed and up front for patient pick up.

## 2016-07-17 NOTE — Addendum Note (Signed)
Addended by: Rogers Seeds on: 07/17/2016 10:49 AM   Modules accepted: Orders

## 2016-07-17 NOTE — Telephone Encounter (Signed)
FYI.  Pt called to cancel her hospital follow up for today at 10:00am. She stated that the antibiotic she is taking for a staph infection makes her extremely tired and she just can not come in today. She stated that she will call back to reschedule apt.

## 2016-07-17 NOTE — Telephone Encounter (Signed)
noted 

## 2016-07-17 NOTE — Telephone Encounter (Signed)
Fill out Rx for percocet 5/325    # 40    1 po tid prn pain. Print and she can have her husband pick it up thanks

## 2016-07-17 NOTE — Telephone Encounter (Signed)
I called patient and advised, script is at front for pick up.

## 2016-07-18 ENCOUNTER — Telehealth (INDEPENDENT_AMBULATORY_CARE_PROVIDER_SITE_OTHER): Payer: Self-pay | Admitting: Orthopaedic Surgery

## 2016-07-18 NOTE — Telephone Encounter (Signed)
Phys therapist from [facility] calling for verbal for pt for 2x this week then once a week for 3 wks.  Rayann [phone]

## 2016-07-18 NOTE — Telephone Encounter (Signed)
Please advise. Thanks.  

## 2016-07-19 ENCOUNTER — Ambulatory Visit (INDEPENDENT_AMBULATORY_CARE_PROVIDER_SITE_OTHER): Payer: Managed Care, Other (non HMO)

## 2016-07-19 ENCOUNTER — Ambulatory Visit (INDEPENDENT_AMBULATORY_CARE_PROVIDER_SITE_OTHER): Payer: Self-pay | Admitting: Orthopaedic Surgery

## 2016-07-19 ENCOUNTER — Encounter (INDEPENDENT_AMBULATORY_CARE_PROVIDER_SITE_OTHER): Payer: Self-pay | Admitting: Orthopaedic Surgery

## 2016-07-19 VITALS — BP 162/86 | HR 81 | Ht 64.0 in | Wt 155.0 lb

## 2016-07-19 DIAGNOSIS — S129XXD Fracture of neck, unspecified, subsequent encounter: Secondary | ICD-10-CM | POA: Diagnosis not present

## 2016-07-19 NOTE — Telephone Encounter (Signed)
noted 

## 2016-07-19 NOTE — Progress Notes (Signed)
Post-Op Visit Note   Patient: Kara [name]           Date of Birth: 08-13-1955           MRN: [id number] Visit Date: 07/19/2016 PCP: Felix Pacini, DO   Assessment & Plan: Continue VAC changes and continue IV Ancef. Continue oral rifampin. Return 2 weeks for wound check. Prescription written for the home health nurse to continue Ancef for 21 days. I plan to check her in 14 days to check the wound. She has a wire present in a want to make sure that infection is adequately treated with hardware present.  Chief Complaint:  Chief Complaint  Patient presents with  . Neck - Routine Post Op   Visit Diagnoses:  1. Pseudoarthrosis of cervical spine, subsequent encounter           Postoperative MSSA infection. Patient is on Ancef and  by PICC line and by mouth rifampin. She has stopped her narcotic medication. Patient states she's gotten good improvement in her pain in her neck she is moving her neck better not using her collar and states her fingers feel good. Continue VAC changes and recheck in 2 weeks. I reviewed the x-rays today with her and her husband.  Plan: Continue VAC changes. This is 3 times a week. Recheck 2 weeks.  Follow-Up Instructions: Return in about 2 weeks (around 08/02/2016).   Orders:  Orders Placed This Encounter  Procedures  . XR Cervical Spine 2 or 3 views   No orders of the defined types were placed in this encounter.   Imaging: No results found.  PMFS History: Patient Active Problem List   Diagnosis Date Noted  . Post-operative infection 07/06/2016  . SIRS (systemic inflammatory response syndrome) (HCC) 07/05/2016  . Wound infection 07/05/2016  . Pyuria 07/05/2016  . Cervical pseudoarthrosis (HCC) 06/21/2016  . Opioid dependence in remission (HCC) 06/14/2016  . Mitral valve prolapse 01/04/2016  . Skin infection 12/22/2015  . History of recent hospitalization 12/22/2015  . Altered mental status 12/13/2015  . HNP (herniated nucleus pulposus), lumbar  12/10/2015  . Prediabetes 08/19/2015  . BMI 26.0-26.9,adult 08/18/2015  . Chronic pain 08/18/2015  . Vitamin D deficiency 08/18/2015  . Atypical chest pain 08/18/2015  . Elevated BP 08/18/2015  . Chronic fatigue disorder 06/14/2015  . Nail fungus 06/14/2015  . S/P cervical spinal fusion 03/15/2015  . Spinal surgery in prior 3 months 07/03/2014  . PTSD (post-traumatic stress disorder) 03/07/2014  . Severe recurrent major depression without psychotic features (HCC) 03/06/2014  . Suicide threat or attempt 03/06/2014  . Agitation 03/06/2014  . Aggressive behavior   . Family history of malignant neoplasm of breast   . Rheumatoid arthritis (HCC) 12/13/2011  . Fibromyalgia 12/13/2011   Past Medical History:  Diagnosis Date  . ADD (attention deficit disorder)    on Adderal  . Aggressive behavior of adult   . Allergy   . Anxiety   . Arthritis   . Asthma    as a child  . Cellulitis of breast 11/2013   RIGHT BREAST  . 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 Chronic kidney disease    due to infection from stone- no regular visits- fine now  . Chronic pain    goes to [facility] pain control  . Cold sore   . 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 Depression    takes meds daily  .  Family history of malignant neoplasm of breast   . Fibromyalgia   . Nephrolithiasis   . Osteoporosis   . Other specified rheumatoid arthritis, right shoulder (HCC) 08/01/2011  . Post-nasal drip    hx of  . Rheumatoid arthritis(714.0)    autoimmune arthritis; methotrexate once/week  . Urinary incontinence     Family History  Problem Relation Age of Onset  . Arthritis Mother   . Heart disease Mother     ?psvt  . Breast cancer Mother 40    TAH/BSO  . Cancer Mother   . Mental illness Mother   . COPD Father   . Hypertension Father   . Alcohol abuse Father   . Mental illness Father   . Heart disease Father   . Healthy  Daughter   . Breast cancer Maternal Aunt 27    deceased  . Cancer Cousin 38    female; unknown primary  . Colon cancer Paternal Aunt 36    deceased at 6  . Stomach cancer Paternal Uncle 73    deceased at 40  . Alcohol abuse Brother   . Cancer Brother   . Hodgkin's lymphoma Brother   . Mental illness Brother   . HIV Brother   . Healthy Brother   . Healthy Son     Past Surgical History:  Procedure Laterality Date  . ANTERIOR CERVICAL DECOMP/DISCECTOMY FUSION N/A 03/15/2015   Procedure: Cervical five-six, Cerival six-seven, Anterior Cervical Discectomy and Fusion, Allograft and Plate;  Surgeon: Eldred Manges, MD;  Location: [facility];  Service: Orthopedics;  Laterality: N/A;  . BACK SURGERY  2011,16   lower back fusion l4-5, s1  . BLADDER SURGERY  2009  . BREAST ENHANCEMENT SURGERY  2003  . breast implants removed    . BREAST SURGERY  10/2013   REMOVAL OF BREAST IMPLANTS  . CERVICAL WOUND DEBRIDEMENT N/A 07/07/2016   Procedure: IRRIGATION AND DEBRIDEMENT POSTERIOR NECK;  Surgeon: Eldred Manges, MD;  Location: [facility] OR;  Service: Orthopedics;  Laterality: N/A;  . INCISION AND DRAINAGE ABSCESS Right 01/16/2014   Procedure: INCISION AND DRAINAGE AND OF RIGHT BREAST ABCESS;  Surgeon: Glenna Fellows, MD;  Location: [facility];  Service: General;  Laterality: Right;  . JOINT REPLACEMENT    . LIPOSUCTION  2003   abdominal  . LUMBAR LAMINECTOMY/DECOMPRESSION MICRODISCECTOMY N/A 12/10/2015   Procedure: Right L3-4 Hemilaminectomy, Excision of herniated nucleus pulposus;  Surgeon: Eldred Manges, MD;  Location[facility];  Service: Orthopedics;  Laterality: N/A;  . MASS EXCISION  11/03/2011   Procedure: MINOR EXCISION OF MASS;  Surgeon: Wyn Forster., MD;  Location: [facility];  Service: Orthopedics;  Laterality: Left;  debride IP joint, cyst excision left index  . POSTERIOR CERVICAL FUSION/FORAMINOTOMY N/A 06/21/2016   Procedure: POSTERIOR CERVICAL FUSION C5-C7 SPINOUS PROCESS WIRING;   Surgeon: Eldred Manges, MD;  Location: [facility];  Service: Orthopedics;  Laterality: N/A;  . POSTERIOR FUSION CERVICAL SPINE  06/21/2016   C5 C7  . SHOULDER SURGERY  2012   right  . TONSILLECTOMY  1987  . TOTAL SHOULDER ARTHROPLASTY  08/01/2011   Procedure: TOTAL SHOULDER ARTHROPLASTY;  Surgeon: Eulas Post, MD;  Location: [facility] OR;  Service: Orthopedics;  Laterality: Right;  Right total shoulder arthroplasty  . TUBAL LIGATION  1988   Social History   Occupational History  . Not on file.   Social History Main Topics  . Smoking status: Former Smoker    Packs/day: 0.50  Years: 10.00    Types: Cigarettes    Quit date: 12/06/2005  . Smokeless tobacco: Never Used     Comment: smoked for 10 years  . Alcohol use No     Comment: denied alcohol  . Drug use: No     Comment: denied any drug use with admission nurse  . Sexual activity: Yes    Birth control/ protection: Post-menopausal

## 2016-07-19 NOTE — Telephone Encounter (Signed)
Ok,   I called her and gave approval . FYI

## 2016-07-21 ENCOUNTER — Encounter (HOSPITAL_COMMUNITY): Payer: Self-pay | Admitting: Emergency Medicine

## 2016-07-21 ENCOUNTER — Emergency Department (HOSPITAL_COMMUNITY)
Admission: EM | Admit: 2016-07-21 | Discharge: 2016-07-21 | Disposition: A | Discharge status: Home / Self Care | Payer: Managed Care, Other (non HMO) | Attending: Emergency Medicine | Admitting: Emergency Medicine

## 2016-07-21 ENCOUNTER — Ambulatory Visit (HOSPITAL_COMMUNITY)
Admission: EM | Admit: 2016-07-21 | Discharge: 2016-07-21 | Disposition: A | Discharge status: Nursing Home (Includes ICF) | Payer: Managed Care, Other (non HMO) | Source: Home / Self Care | Admitting: Emergency Medicine

## 2016-07-21 DIAGNOSIS — F909 Attention-deficit hyperactivity disorder, unspecified type: Secondary | ICD-10-CM | POA: Insufficient documentation

## 2016-07-21 DIAGNOSIS — R030 Elevated blood-pressure reading, without diagnosis of hypertension: Secondary | ICD-10-CM

## 2016-07-21 DIAGNOSIS — I129 Hypertensive chronic kidney disease with stage 1 through stage 4 chronic kidney disease, or unspecified chronic kidney disease: Secondary | ICD-10-CM | POA: Insufficient documentation

## 2016-07-21 DIAGNOSIS — Z79899 Other long term (current) drug therapy: Secondary | ICD-10-CM | POA: Diagnosis not present

## 2016-07-21 DIAGNOSIS — Z87891 Personal history of nicotine dependence: Secondary | ICD-10-CM | POA: Diagnosis not present

## 2016-07-21 DIAGNOSIS — N189 Chronic kidney disease, unspecified: Secondary | ICD-10-CM | POA: Insufficient documentation

## 2016-07-21 DIAGNOSIS — Z96611 Presence of right artificial shoulder joint: Secondary | ICD-10-CM | POA: Diagnosis not present

## 2016-07-21 LAB — I-STAT CHEM 8, ED
BUN: 18 mg/dL (ref 6–20)
CHLORIDE: 105 mmol/L (ref 101–111)
Calcium, Ion: 1.2 mmol/L (ref 1.15–1.40)
Creatinine, Ser: 0.6 mg/dL (ref 0.44–1.00)
Glucose, Bld: 95 mg/dL (ref 65–99)
HEMATOCRIT: 37 % (ref 36.0–46.0)
HEMOGLOBIN: 12.6 g/dL (ref 12.0–15.0)
POTASSIUM: 3.5 mmol/L (ref 3.5–5.1)
Sodium: 141 mmol/L (ref 135–145)
TCO2: 28 mmol/L (ref 0–100)

## 2016-07-21 LAB — URINALYSIS, ROUTINE W REFLEX MICROSCOPIC
BILIRUBIN URINE: NEGATIVE
Bacteria, UA: NONE SEEN
Glucose, UA: NEGATIVE mg/dL
Ketones, ur: NEGATIVE mg/dL
Nitrite: NEGATIVE
PH: 5 (ref 5.0–8.0)
Protein, ur: NEGATIVE mg/dL
SPECIFIC GRAVITY, URINE: 1.015 (ref 1.005–1.030)

## 2016-07-21 NOTE — Discharge Instructions (Signed)
Your blood work and EKG today are reassuring. We will not start you on Blood pressure medication today. You will need to see your doctor and they will decide about medication.

## 2016-07-21 NOTE — ED Triage Notes (Signed)
Pt had neck surgery with wound vac in place, reports fluctuating bp since yesterday and thinks it may be from her pain, denies any hx of htn. Pt reports no issues with wound or wound vac.

## 2016-07-21 NOTE — ED Provider Notes (Signed)
[facility] DEPT Provider Note   CSN: [id number] Arrival date & time: 07/21/16  1610  By signing my name below, I, Marcello Moores, attest that this documentation has been prepared under the direction and in the presence of Debroah Baller, NP Electronically Signed: Marcello Moores, ED Scribe. 07/21/16. 5:10 PM.   History   Chief Complaint Chief Complaint  Patient presents with  . Hypertension    The history is provided by the patient. No language interpreter was used.  Hypertension  This is a new problem. The current episode started yesterday. The problem occurs constantly. The problem has not changed since onset.Associated symptoms include headaches. Nothing aggravates the symptoms. Nothing relieves the symptoms. She has tried nothing for the symptoms. The treatment provided no relief.    HPI Comments: Kara [name] is a 61 y.o. female s/p C5-C7 posterior fusion 06/21/16 and cervical wound debridement 07/07/16, wound vac placement, who presents to the Emergency Department complaining of fluctuating blood pressure that peaked at 983 systolic yesterday evening. Pt notes that her bp is usually 120/70, and she has gotten readings of 176/105, 189/95 and 163/83 at home. She reports she plans of following up with her PCP after the week end. Associated symptoms include HA and 8-9/10 lower back pain, which is not significantly worsened from her baseline of chronic back pain. No h/o HTN or migraines. She denies nausea, vomiting, fever, chills.   Past Medical History:  Diagnosis Date  . ADD (attention deficit disorder)    on Adderal  . Aggressive behavior of adult   . Allergy   . Anxiety   . Arthritis   . Asthma    as a child  . Cellulitis of breast 11/2013   RIGHT BREAST  . Chronic fatigue and immune dysfunction syndrome ([place])   . Chronic kidney disease    due to infection from stone- no regular visits- fine now  . Chronic pain    goes to [facility] for pain control  . Cold  sore   . Confusion caused by a drug ([place])    methotrexate and autoimmune disease   . Depression    takes meds daily  . Family history of malignant neoplasm of breast   . Fibromyalgia   . Nephrolithiasis   . Osteoporosis   . Other specified rheumatoid arthritis, right shoulder ([place]) 08/01/2011  . Post-nasal drip    hx of  . Rheumatoid arthritis(714.0)    autoimmune arthritis; methotrexate once/week  . Urinary incontinence     Patient Active Problem List   Diagnosis Date Noted  . Post-operative infection 07/06/2016  . SIRS (systemic inflammatory response syndrome) ([place]) 07/05/2016  . Wound infection 07/05/2016  . Pyuria 07/05/2016  . Cervical pseudoarthrosis ([place]) 06/21/2016  . Opioid dependence in remission ([place]) 06/14/2016  . Mitral valve prolapse 01/04/2016  . Skin infection 12/22/2015  . History of recent hospitalization 12/22/2015  . Altered mental status 12/13/2015  . HNP (herniated nucleus pulposus), lumbar 12/10/2015  . Prediabetes 08/19/2015  . BMI 26.0-26.9,adult 08/18/2015  . Chronic pain 08/18/2015  . Vitamin D deficiency 08/18/2015  . Atypical chest pain 08/18/2015  . Elevated BP 08/18/2015  . Chronic fatigue disorder 06/14/2015  . Nail fungus 06/14/2015  . S/P cervical spinal fusion 03/15/2015  . Spinal surgery in prior 3 months 07/03/2014  . PTSD (post-traumatic stress disorder) 03/07/2014  . Severe recurrent major depression without psychotic features ([place]) 03/06/2014  . Suicide threat or attempt 03/06/2014  . Agitation 03/06/2014  . Aggressive behavior   .  Family history of malignant neoplasm of breast   . Rheumatoid arthritis ([place]) 12/13/2011  . Fibromyalgia 12/13/2011    Past Surgical History:  Procedure Laterality Date  . ANTERIOR CERVICAL DECOMP/DISCECTOMY FUSION N/A 03/15/2015   Procedure: Cervical five-six, Cerival six-seven, Anterior Cervical Discectomy and Fusion, Allograft and Plate;  Surgeon: Marybelle Killings, MD;  Location: [facility];  Service:  Orthopedics;  Laterality: N/A;  . BACK SURGERY  2011,16   lower back fusion l4-5, s1  . BLADDER SURGERY  2009  . BREAST ENHANCEMENT SURGERY  2003  . breast implants removed    . BREAST SURGERY  10/2013   REMOVAL OF BREAST IMPLANTS  . CERVICAL WOUND DEBRIDEMENT N/A 07/07/2016   Procedure: IRRIGATION AND DEBRIDEMENT POSTERIOR NECK;  Surgeon: Marybelle Killings, MD;  Location: [facility];  Service: Orthopedics;  Laterality: N/A;  . INCISION AND DRAINAGE ABSCESS Right 01/16/2014   Procedure: INCISION AND DRAINAGE AND OF RIGHT BREAST ABCESS;  Surgeon: Excell Seltzer, MD;  Location: [facility];  Service: General;  Laterality: Right;  . JOINT REPLACEMENT    . LIPOSUCTION  2003   abdominal  . LUMBAR LAMINECTOMY/DECOMPRESSION MICRODISCECTOMY N/A 12/10/2015   Procedure: Right L3-4 Hemilaminectomy, Excision of herniated nucleus pulposus;  Surgeon: Marybelle Killings, MD;  Location: [facility];  Service: Orthopedics;  Laterality: N/A;  . MASS EXCISION  11/03/2011   Procedure: MINOR EXCISION OF MASS;  Surgeon: Cammie Sickle., MD;  Location: [facility];  Service: Orthopedics;  Laterality: Left;  debride IP joint, cyst excision left index  . POSTERIOR CERVICAL FUSION/FORAMINOTOMY N/A 06/21/2016   Procedure: POSTERIOR CERVICAL FUSION C5-C7 SPINOUS PROCESS WIRING;  Surgeon: Marybelle Killings, MD;  Location: [facility];  Service: Orthopedics;  Laterality: N/A;  . POSTERIOR FUSION CERVICAL SPINE  06/21/2016   C5 C7  . SHOULDER SURGERY  2012   right  . TONSILLECTOMY  1987  . TOTAL SHOULDER ARTHROPLASTY  08/01/2011   Procedure: TOTAL SHOULDER ARTHROPLASTY;  Surgeon: Johnny Bridge, MD;  Location: [facility];  Service: Orthopedics;  Laterality: Right;  Right total shoulder arthroplasty  . TUBAL LIGATION  1988    OB History    No data available       Home Medications    Prior to Admission medications   Medication Sig Start Date End Date Taking? Authorizing Provider  ACTEMRA 162 MG/0.9ML SOSY Has stopped prior to procedure  04/11/16   Historical Provider, MD  ADDERALL XR 30 MG 24 hr capsule Take 60 mg by mouth daily. Takes 2 capsules in the morning 11/02/15   Historical Provider, MD  ALPRAZolam Duanne Moron) 1 MG tablet Take 1 mg by mouth 3 (three) times daily as needed for anxiety.  07/06/14   Historical Provider, MD  amphetamine-dextroamphetamine (ADDERALL) 30 MG tablet Take 30 mg by mouth daily. Before 4 pm 04/29/16   Historical Provider, MD  ceFAZolin (ANCEF) IVPB Inject 2 g into the vein every 8 (eight) hours. Indication:  Wound infection Last Day of Therapy:  08/16/16 Labs - Once weekly:  CBC/D and BMP, Labs - Every other week:  ESR and CRP 07/10/16   Marybelle Killings, MD  cyclobenzaprine (FLEXERIL) 10 MG tablet TAKE 1 TABLET (10 MG TOTAL) BY MOUTH 3 (THREE) TIMES DAILY AS NEEDED FOR MUSCLE SPASMS. 02/11/16   Marybelle Killings, MD  diclofenac (VOLTAREN) 75 MG EC tablet Take 1 tablet (75 mg total) by mouth 2 (two) times daily. 05/02/16   Marybelle Killings, MD  Diclofenac Sodium (PENNSAID)  2 % SOLN Place 2 g onto the skin 4 (four) times daily as needed. Patient taking differently: Place 2 g onto the skin 2 (two) times daily as needed (joint pain).  02/24/16   Leandrew Koyanagi, MD  FLUoxetine (PROZAC) 10 MG capsule  06/27/16   Historical Provider, MD  methocarbamol (ROBAXIN) 500 MG tablet Take 1 tablet (500 mg total) by mouth 4 (four) times daily. Patient taking differently: Take 500 mg by mouth every 6 (six) hours as needed for muscle spasms.  06/22/16   Marybelle Killings, MD  oxyCODONE-acetaminophen (PERCOCET) 10-325 MG tablet Take 1 tablet by mouth every 6 (six) hours as needed for pain. Patient not taking: Reported on 07/19/2016 07/10/16   Marybelle Killings, MD  oxyCODONE-acetaminophen (PERCOCET/ROXICET) 5-325 MG tablet Take 1 tablet by mouth every 8 (eight) hours as needed for severe pain. Patient not taking: Reported on 07/19/2016 07/17/16   Marybelle Killings, MD  rifampin (RIFADIN) 300 MG capsule Take 1 capsule (300 mg total) by mouth daily. 07/11/16   Kshitiz  Starla Link, MD  venlafaxine XR (EFFEXOR-XR) 75 MG 24 hr capsule Take 150 mg by mouth daily with breakfast.     Historical Provider, MD  zolpidem (AMBIEN) 10 MG tablet TAKE 1 TABLET (10 mg) BY MOUTH AT BEDTIME AS NEEDED for sleep 05/23/15   Historical Provider, MD    Family History Family History  Problem Relation Age of Onset  . Arthritis Mother   . Heart disease Mother     ?psvt  . Breast cancer Mother 51    TAH/BSO  . Cancer Mother   . Mental illness Mother   . COPD Father   . Hypertension Father   . Alcohol abuse Father   . Mental illness Father   . Heart disease Father   . Healthy Daughter   . Breast cancer Maternal Aunt 27    deceased  . Cancer Cousin 39    female; unknown primary  . Colon cancer Paternal Aunt 91    deceased at 9  . Stomach cancer Paternal Uncle 57    deceased at 82  . Alcohol abuse Brother   . Cancer Brother   . Hodgkin's lymphoma Brother   . Mental illness Brother   . HIV Brother   . Healthy Brother   . Healthy Son     Social History Social History  Substance Use Topics  . Smoking status: Former Smoker    Packs/day: 0.50    Years: 10.00    Types: Cigarettes    Quit date: 12/06/2005  . Smokeless tobacco: Never Used     Comment: smoked for 10 years  . Alcohol use No     Comment: denied alcohol     Allergies   Aspirin; Ibuprofen; Ativan [lorazepam]; Ketamine; Toradol [ketorolac tromethamine]; and Tramadol hcl   Review of Systems Review of Systems  Constitutional: Negative for chills and fever.  Gastrointestinal: Negative for nausea and vomiting.  Musculoskeletal: Positive for back pain (chronic).  Neurological: Positive for headaches.  Psychiatric/Behavioral: Negative for agitation.  All other systems reviewed and are negative.    Physical Exam Updated Vital Signs BP (!) 161/96 (BP Location: Left Arm)   Pulse 82   Temp 98.6 F (37 C) (Oral)   Resp 17   Ht _0  (1.626 m)   Wt 72.6 kg   SpO2 98%   BMI 27.46 kg/m   Physical  Exam  Constitutional: She appears well-developed and well-nourished. No distress.  HENT:  Head:  Normocephalic and atraumatic.  Eyes: Conjunctivae are normal.  Neck: Normal range of motion. Neck supple. No JVD present.  No bruits. FROM of the neck.   Cardiovascular: Normal rate and regular rhythm.   No murmur heard. Pulmonary/Chest: Effort normal and breath sounds normal. No respiratory distress.  Abdominal: Soft. There is no tenderness.  Musculoskeletal: She exhibits no edema.  Neurological: She is alert.  Skin: Skin is warm and dry.  Psychiatric: She has a normal mood and affect.  Nursing note and vitals reviewed.    ED Treatments / Results  DIAGNOSTIC STUDIES: Oxygen Saturation is 98% on RA, normal by my interpretation.   COORDINATION OF CARE: 4:50 PM-Discussed next steps with pt which includes basic labs, I-stat, UA, EKG. Pt verbalized understanding and is agreeable with the plan.   Labs (all labs ordered are listed, but only abnormal results are displayed) Labs Reviewed  URINALYSIS, ROUTINE W REFLEX MICROSCOPIC - Abnormal; Notable for the following:       Result Value   Hgb urine dipstick SMALL (*)    Leukocytes, UA SMALL (*)    Squamous Epithelial / LPF 0-5 (*)    All other components within normal limits  URINE CULTURE  I-STAT CHEM 8, ED    EKG  EKG Interpretation  Date/Time:  Friday July 21 2016 17:26:32 EDT Ventricular Rate:  85 PR Interval:  140 QRS Duration: 74 QT Interval:  372 QTC Calculation: 442 R Axis:   33 Text Interpretation:  Normal sinus rhythm Possible Left atrial enlargement Low voltage QRS Baseline wander Since last tracing possible left atrial enlargement Confirmed by Eulis Foster  MD, Vira Agar ([id number]) on 07/21/2016 6:07:18 PM       Radiology No results found.  Procedures Procedures (including critical care time)  Medications Ordered in ED Medications - No data to display   Initial Impression / Assessment and Plan / ED Course  I have  reviewed the triage vital signs and the nursing notes.  Pertinent lab results that were available during my care of the patient were reviewed by me and considered in my medical decision making (see chart for details).   Final Clinical Impressions(s) / ED Diagnoses  61 y.o. female with elevated home BP readings stable for d/c with normal ISTAT 8 and BP readings here slightly elevated. Discussed in detail with the patient clinical and lab findings and plan of care. Patient voices understanding and agrees with plan. Discussed urine results and will send for culture.   Final diagnoses:  Elevated blood pressure reading  Patient currently on IV antibiotics.  Urine sent for culture. Discussed results with the patient.   New Prescriptions New Prescriptions   No medications on file  I personally performed the services described in this documentation, which was scribed in my presence. The recorded information has been reviewed and is accurate.     [place], NP 07/21/16 [place], MD 07/21/16 [phone]

## 2016-07-23 LAB — URINE CULTURE: CULTURE: NO GROWTH

## 2016-07-24 ENCOUNTER — Encounter: Payer: Self-pay | Admitting: Infectious Diseases

## 2016-07-25 ENCOUNTER — Encounter: Payer: Self-pay | Admitting: Family Medicine

## 2016-07-25 ENCOUNTER — Encounter (HOSPITAL_COMMUNITY): Payer: Self-pay | Admitting: Emergency Medicine

## 2016-07-25 ENCOUNTER — Ambulatory Visit (INDEPENDENT_AMBULATORY_CARE_PROVIDER_SITE_OTHER): Payer: Managed Care, Other (non HMO) | Admitting: Family Medicine

## 2016-07-25 VITALS — BP 174/118 | HR 86 | Temp 98.5°F | Resp 20 | Ht 64.0 in | Wt 159.2 lb

## 2016-07-25 DIAGNOSIS — Z7189 Other specified counseling: Secondary | ICD-10-CM

## 2016-07-25 DIAGNOSIS — Z87891 Personal history of nicotine dependence: Secondary | ICD-10-CM | POA: Insufficient documentation

## 2016-07-25 DIAGNOSIS — Z96611 Presence of right artificial shoulder joint: Secondary | ICD-10-CM | POA: Insufficient documentation

## 2016-07-25 DIAGNOSIS — I10 Essential (primary) hypertension: Secondary | ICD-10-CM

## 2016-07-25 DIAGNOSIS — F909 Attention-deficit hyperactivity disorder, unspecified type: Secondary | ICD-10-CM | POA: Insufficient documentation

## 2016-07-25 DIAGNOSIS — Y69 Unspecified misadventure during surgical and medical care: Secondary | ICD-10-CM | POA: Diagnosis not present

## 2016-07-25 DIAGNOSIS — Z515 Encounter for palliative care: Secondary | ICD-10-CM

## 2016-07-25 DIAGNOSIS — J45909 Unspecified asthma, uncomplicated: Secondary | ICD-10-CM | POA: Insufficient documentation

## 2016-07-25 DIAGNOSIS — T814XXA Infection following a procedure, initial encounter: Secondary | ICD-10-CM | POA: Diagnosis present

## 2016-07-25 DIAGNOSIS — L709 Acne, unspecified: Secondary | ICD-10-CM

## 2016-07-25 LAB — TSH: TSH: 2.74 u[IU]/mL (ref 0.35–4.50)

## 2016-07-25 MED ORDER — AMLODIPINE BESYLATE 5 MG PO TABS: 2.5000 mg | ORAL_TABLET | Freq: Every day | ORAL | 3 refills | Status: DC

## 2016-07-25 MED ORDER — TRETINOIN 0.1 % EX CREA: TOPICAL_CREAM | Freq: Every day | CUTANEOUS | 0 refills | Status: DC

## 2016-07-25 NOTE — Patient Instructions (Signed)
Taper effexor down to 150 mg daily, by alternating 225 and 150 for 2 doses of the 225.  Start amlodipine 1/2 tab for 1 week, then increase to 1 tab (5 mg) daily of BP > 130/80.  Low sodium intake.  Follow up in 4 weeks. Seek urgent care if BP > 180/100 and have headache, visual changes, dizziness.  Follow up 3-4 weeks.     Hypertension Hypertension, commonly called high blood pressure, is when the force of blood pumping through the arteries is too strong. The arteries are the blood vessels that carry blood from the heart throughout the body. Hypertension forces the heart to work harder to pump blood and may cause arteries to become narrow or stiff. Having untreated or uncontrolled hypertension can cause heart attacks, strokes, kidney disease, and other problems. A blood pressure reading consists of a higher number over a lower number. Ideally, your blood pressure should be below 120/80. The first ("top") number is called the systolic pressure. It is a measure of the pressure in your arteries as your heart beats. The second ("bottom") number is called the diastolic pressure. It is a measure of the pressure in your arteries as the heart relaxes. What are the causes? The cause of this condition is not known. What increases the risk? Some risk factors for high blood pressure are under your control. Others are not. Factors you can change   Smoking.  Having type 2 diabetes mellitus, high cholesterol, or both.  Not getting enough exercise or physical activity.  Being overweight.  Having too much fat, sugar, calories, or salt (sodium) in your diet.  Drinking too much alcohol. Factors that are difficult or impossible to change   Having chronic kidney disease.  Having a family history of high blood pressure.  Age. Risk increases with age.  Race. You may be at higher risk if you are African-American.  Gender. Men are at higher risk than women before age 95. After age 60, women are at  higher risk than men.  Having obstructive sleep apnea.  Stress. What are the signs or symptoms? Extremely high blood pressure (hypertensive crisis) may cause:  Headache.  Anxiety.  Shortness of breath.  Nosebleed.  Nausea and vomiting.  Severe chest pain.  Jerky movements you cannot control (seizures). How is this diagnosed? This condition is diagnosed by measuring your blood pressure while you are seated, with your arm resting on a surface. The cuff of the blood pressure monitor will be placed directly against the skin of your upper arm at the level of your heart. It should be measured at least twice using the same arm. Certain conditions can cause a difference in blood pressure between your right and left arms. Certain factors can cause blood pressure readings to be lower or higher than normal (elevated) for a short period of time:  When your blood pressure is higher when you are in a health care provider's office than when you are at home, this is called white coat hypertension. Most people with this condition do not need medicines.  When your blood pressure is higher at home than when you are in a health care provider's office, this is called masked hypertension. Most people with this condition may need medicines to control blood pressure. If you have a high blood pressure reading during one visit or you have normal blood pressure with other risk factors:  You may be asked to return on a different day to have your blood pressure checked again.  You may be asked to monitor your blood pressure at home for 1 week or longer. If you are diagnosed with hypertension, you may have other blood or imaging tests to help your health care provider understand your overall risk for other conditions. How is this treated? This condition is treated by making healthy lifestyle changes, such as eating healthy foods, exercising more, and reducing your alcohol intake. Your health care provider may  prescribe medicine if lifestyle changes are not enough to get your blood pressure under control, and if:  Your systolic blood pressure is above 130.  Your diastolic blood pressure is above 80. Your personal target blood pressure may vary depending on your medical conditions, your age, and other factors. Follow these instructions at home: Eating and drinking   Eat a diet that is high in fiber and potassium, and low in sodium, added sugar, and fat. An example eating plan is called the DASH (Dietary Approaches to Stop Hypertension) diet. To eat this way:  Eat plenty of fresh fruits and vegetables. Try to fill half of your plate at each meal with fruits and vegetables.  Eat whole grains, such as whole wheat pasta, brown rice, or whole grain bread. Fill about one quarter of your plate with whole grains.  Eat or drink low-fat dairy products, such as skim milk or low-fat yogurt.  Avoid fatty cuts of meat, processed or cured meats, and poultry with skin. Fill about one quarter of your plate with lean proteins, such as fish, chicken without skin, beans, eggs, and tofu.  Avoid premade and processed foods. These tend to be higher in sodium, added sugar, and fat.  Reduce your daily sodium intake. Most people with hypertension should eat less than 1,500 mg of sodium a day.  Limit alcohol intake to no more than 1 drink a day for nonpregnant women and 2 drinks a day for men. One drink equals 12 oz of beer, 5 oz of wine, or 1 oz of hard liquor. Lifestyle   Work with your health care provider to maintain a healthy body weight or to lose weight. Ask what an ideal weight is for you.  Get at least 30 minutes of exercise that causes your heart to beat faster (aerobic exercise) most days of the week. Activities may include walking, swimming, or biking.  Include exercise to strengthen your muscles (resistance exercise), such as pilates or lifting weights, as part of your weekly exercise routine. Try to do  these types of exercises for 30 minutes at least 3 days a week.  Do not use any products that contain nicotine or tobacco, such as cigarettes and e-cigarettes. If you need help quitting, ask your health care provider.  Monitor your blood pressure at home as told by your health care provider.  Keep all follow-up visits as told by your health care provider. This is important. Medicines   Take over-the-counter and prescription medicines only as told by your health care provider. Follow directions carefully. Blood pressure medicines must be taken as prescribed.  Do not skip doses of blood pressure medicine. Doing this puts you at risk for problems and can make the medicine less effective.  Ask your health care provider about side effects or reactions to medicines that you should watch for. Contact a health care provider if:  You think you are having a reaction to a medicine you are taking.  You have headaches that keep coming back (recurring).  You feel dizzy.  You have swelling in your ankles.  You have trouble with your vision. Get help right away if:  You develop a severe headache or confusion.  You have unusual weakness or numbness.  You feel faint.  You have severe pain in your chest or abdomen.  You vomit repeatedly.  You have trouble breathing. Summary  Hypertension is when the force of blood pumping through your arteries is too strong. If this condition is not controlled, it may put you at risk for serious complications.  Your personal target blood pressure may vary depending on your medical conditions, your age, and other factors. For most people, a normal blood pressure is less than 120/80.  Hypertension is treated with lifestyle changes, medicines, or a combination of both. Lifestyle changes include weight loss, eating a healthy, low-sodium diet, exercising more, and limiting alcohol. This information is not intended to replace advice given to you by your health  care provider. Make sure you discuss any questions you have with your health care provider. Document Released: 03/13/2005 Document Revised: 02/09/2016 Document Reviewed: 02/09/2016 Elsevier Interactive Patient Education  2017 [employer].

## 2016-07-25 NOTE — ED Triage Notes (Addendum)
Patient noted that PICC Line RAC is red at the insertion site and small amount of pus. Patient has wound vac in place to postop incision at cervical spine. Wound vac functioning properly.

## 2016-07-25 NOTE — Progress Notes (Signed)
Kara [name] , 1955/03/30, 61 y.o., female MRN: [id number] Patient Care Team    Relationship Specialty Notifications Start End  Ma Hillock, DO PCP - General Family Medicine  06/14/15   Marybelle Killings, MD Consulting Physician Orthopedic Surgery  06/14/15   Chucky May, MD Consulting Physician Psychiatry  06/14/15     Chief Complaint  Patient presents with  . Hypertension    ER follow up     Subjective:  Elevated BP: Pt presents for an OV with complaints of elevated BP. Patient has many recent ED visits following cervical fusion of her neck. She currently has a wound vac in place for post-op infection. She is prescribed a narcotic for her pain by her surgeon. She reports fairly controlled pain with the use of regimen. She has had elevated BP readings recorded high of 974B systolic and 638 diastolic. She reports having a headache at home and taking her BP with a systolic reading above 453. She endorses headaches and "tunnel vision" feeling intermittently over hte last 2 weeks with elevated BP. She thought it might have been secondary to her pain level, however Bp remains elevated and pain is better controlled. She does report taking Adderall XR 60 mg in the morning and Adderall 67m (short acting) in the evening as well as Effexor 225 mg daily. She states she increased her effexor from 150 to 225 about 2 months. Pt has had intermittent borderline BP readings documented in EMR over many visits, with elevated BP readings mostly occurring over the 5 weeks. Her cervical fusion occurred 06/21/2016 with repeat surgery for infection 07/07/2016.  Acne: pt also states she has used retin-A cream intermittently over the last years and has run out of medications. She is asking for refills today.   [facility]  Decreased Interest 0  Down, Depressed, Hopeless 0  PHQ - 2 Score 0    Allergies  Allergen Reactions  . Aspirin Anaphylaxis  . Ibuprofen Anaphylaxis  . Ativan  [Lorazepam] Other (See Comments)    Makes agitated, combative   . Ketamine Other (See Comments)    Hallucinations  . Toradol [Ketorolac Tromethamine] Itching, Swelling and Rash    UNSPECIFIED REACTION   . Tramadol Hcl Itching, Swelling and Rash    SWELLING REACTION UNSPECIFIED  Tolerates Dilaudid 06/2016.  TDD.   Social History  Substance Use Topics  . Smoking status: Former Smoker    Packs/day: 0.50    Years: 10.00    Types: Cigarettes    Quit date: 12/06/2005  . Smokeless tobacco: Never Used     Comment: smoked for 10 years  . Alcohol use No     Comment: denied alcohol   Past Medical History:  Diagnosis Date  . ADD (attention deficit disorder)    on Adderal  . Aggressive behavior of adult   . Allergy   . Anxiety   . Arthritis   . Asthma    as a child  . Cellulitis of breast 11/2013   RIGHT BREAST  . [place] immune [place]   . Chronic kidney disease    due to infection from stone- no regular visits- fine now  . Chronic pain    goes to [facility] for pain control  . Cold sore   . [place]    methotrexate and autoimmune disease   . Depression    takes meds daily  . Family history of malignant neoplasm  of breast   . Fibromyalgia   . Nephrolithiasis   . Osteoporosis   . Other specified rheumatoid arthritis, right shoulder ([place]) 08/01/2011  . Post-nasal drip    hx of  . Rheumatoid arthritis(714.0)    autoimmune arthritis; methotrexate once/week  . Urinary incontinence    Past Surgical History:  Procedure Laterality Date  . ANTERIOR CERVICAL DECOMP/DISCECTOMY FUSION N/A 03/15/2015   Procedure: Cervical five-six, Cerival six-seven, Anterior Cervical Discectomy and Fusion, Allograft and Plate;  Surgeon: Marybelle Killings, MD;  Location: [place];  Service: Orthopedics;  Laterality: N/A;  . BACK SURGERY  2011,16   lower back fusion l4-5, s1  . BLADDER SURGERY  2009  . BREAST ENHANCEMENT SURGERY  2003  .  breast implants removed    . BREAST SURGERY  10/2013   REMOVAL OF BREAST IMPLANTS  . CERVICAL WOUND DEBRIDEMENT N/A 07/07/2016   Procedure: IRRIGATION AND DEBRIDEMENT POSTERIOR NECK;  Surgeon: Marybelle Killings, MD;  Location: [place];  Service: Orthopedics;  Laterality: N/A;  . INCISION AND DRAINAGE ABSCESS Right 01/16/2014   Procedure: INCISION AND DRAINAGE AND OF RIGHT BREAST ABCESS;  Surgeon: Excell Seltzer, MD;  Location: [facility];  Service: General;  Laterality: Right;  . JOINT REPLACEMENT    . LIPOSUCTION  2003   abdominal  . LUMBAR LAMINECTOMY/DECOMPRESSION MICRODISCECTOMY N/A 12/10/2015   Procedure: Right L3-4 Hemilaminectomy, Excision of herniated nucleus pulposus;  Surgeon: Marybelle Killings, MD;  Location: [place];  Service: Orthopedics;  Laterality: N/A;  . MASS EXCISION  11/03/2011   Procedure: MINOR EXCISION OF MASS;  Surgeon: Cammie Sickle., MD;  Location: [place];  Service: Orthopedics;  Laterality: Left;  debride IP joint, cyst excision left index  . POSTERIOR CERVICAL FUSION/FORAMINOTOMY N/A 06/21/2016   Procedure: POSTERIOR CERVICAL FUSION C5-C7 SPINOUS PROCESS WIRING;  Surgeon: Marybelle Killings, MD;  Location: [place];  Service: Orthopedics;  Laterality: N/A;  . POSTERIOR FUSION CERVICAL SPINE  06/21/2016   C5 C7  . SHOULDER SURGERY  2012   right  . TONSILLECTOMY  1987  . TOTAL SHOULDER ARTHROPLASTY  08/01/2011   Procedure: TOTAL SHOULDER ARTHROPLASTY;  Surgeon: Johnny Bridge, MD;  Location: [place];  Service: Orthopedics;  Laterality: Right;  Right total shoulder arthroplasty  . TUBAL LIGATION  1988   Family History  Problem Relation Age of Onset  . Arthritis Mother   . Heart disease Mother     ?psvt  . Breast cancer Mother 70    TAH/BSO  . Cancer Mother   . Mental illness Mother   . COPD Father   . Hypertension Father   . Alcohol abuse Father   . Mental illness Father   . Heart disease Father   . Healthy Daughter   . Breast cancer Maternal Aunt 27     deceased  . Cancer Cousin 50    female; unknown primary  . Colon cancer Paternal Aunt 1    deceased at 48  . Stomach cancer Paternal Uncle 65    deceased at 26  . Alcohol abuse Brother   . Cancer Brother   . Hodgkin's lymphoma Brother   . Mental illness Brother   . HIV Brother   . Healthy Brother   . Healthy Son    Allergies as of 07/25/2016      Reactions   Aspirin Anaphylaxis   Ibuprofen Anaphylaxis   Ativan [lorazepam] Other (See Comments)   Makes agitated, combative   Ketamine Other (See Comments)  Hallucinations   Toradol [ketorolac Tromethamine] Itching, Swelling, Rash   UNSPECIFIED REACTION    Tramadol Hcl Itching, Swelling, Rash   SWELLING REACTION UNSPECIFIED  Tolerates Dilaudid 06/2016.  TDD.      Medication List       Accurate as of 07/25/16  2:37 PM. Always use your most recent med list.          ACTEMRA 162 MG/0.9ML Sosy Generic drug:  Tocilizumab Has stopped prior to procedure   ADDERALL XR 30 MG 24 hr capsule Generic drug:  amphetamine-dextroamphetamine Take 60 mg by mouth daily. Takes 2 capsules in the morning   amphetamine-dextroamphetamine 30 MG tablet Commonly known as:  ADDERALL Take 30 mg by mouth daily. Before 4 pm   ALPRAZolam 1 MG tablet Commonly known as:  XANAX Take 1 mg by mouth 3 (three) times daily as needed for anxiety.   ceFAZolin IVPB Commonly known as:  ANCEF Inject 2 g into the vein every 8 (eight) hours. Indication:  Wound infection Last Day of Therapy:  08/16/16 Labs - Once weekly:  CBC/D and BMP, Labs - Every other week:  ESR and CRP   cyclobenzaprine 10 MG tablet Commonly known as:  FLEXERIL TAKE 1 TABLET (10 MG TOTAL) BY MOUTH 3 (THREE) TIMES DAILY AS NEEDED FOR MUSCLE SPASMS.   diclofenac 75 MG EC tablet Commonly known as:  VOLTAREN Take 1 tablet (75 mg total) by mouth 2 (two) times daily.   Diclofenac Sodium 2 % Soln Commonly known as:  PENNSAID Place 2 g onto the skin 4 (four) times daily as needed.     FLUoxetine 10 MG capsule Commonly known as:  PROZAC   methocarbamol 500 MG tablet Commonly known as:  ROBAXIN Take 1 tablet (500 mg total) by mouth 4 (four) times daily.   oxyCODONE-acetaminophen 5-325 MG tablet Commonly known as:  PERCOCET/ROXICET Take 1 tablet by mouth every 8 (eight) hours as needed for severe pain.   rifampin 300 MG capsule Commonly known as:  RIFADIN Take 1 capsule (300 mg total) by mouth daily.   venlafaxine XR 75 MG 24 hr capsule Commonly known as:  EFFEXOR-XR Take 150 mg by mouth daily with breakfast.   zolpidem 10 MG tablet Commonly known as:  AMBIEN TAKE 1 TABLET (10 mg) BY MOUTH AT BEDTIME AS NEEDED for sleep       All past medical history, surgical history, allergies, family history, immunizations andmedications were updated in the EMR today and reviewed under the history and medication portions of their EMR.     ROS: Negative, with the exception of above mentioned in HPI   Objective:  BP (!) 174/118 (BP Location: Left Arm, Patient Position: Sitting, Cuff Size: Normal)   Pulse 86   Temp 98.5 F (36.9 C)   Resp 20   Ht '5\' 4"'$  (1.626 m)   Wt 159 lb 4 oz (72.2 kg)   SpO2 98%   BMI 27.34 kg/m  Body mass index is 27.34 kg/m. Gen: Afebrile. No acute distress. Nontoxic in appearance, well developed, well nourished.  Eyes:Pupils Equal Round Reactive to light, Extraocular movements intact,  Conjunctiva without redness, discharge or icterus. Neck/lymp/endocrine: Supple, left ant right scar, posterior wound vac in place.  CV: RRR, no edema Chest: CTAB, no wheeze or crackles. Good air movement, normal resp effort.  Abd: Soft. NTND. BS present.  Neuro:  Normal gait. PERLA. EOMi. Alert. Oriented x3    No exam data present No results found. No results found for this or  any previous visit (from the past 24 hour(s)).  Assessment/Plan: Kara [name] is a 61 y.o. female present for OV for  Essential hypertension - pt taper back her effexor to 150  mg QD. Instructions on taper provided. She should discuss management of her condition with her psychiatrist if she feels she needs additional coverage. She is also prescribed a rather high dose of adderall, which also could be causing higher BP.  - start amlodipine taper, monitoring BP daily as tapering down on Effexor and up on amlodipine to 5 mg QD or BP <130/80, which ever comes first.. - amLODipine (NORVASC) 5 MG tablet; Take 0.5-1 tablets (2.5-5 mg total) by mouth daily.  Dispense: 30 tablet; Refill: 3 - TSH - Microalbumin / creatinine urine ratio - discussed emergent signs and symptoms of hypertensive crisis and pt educated on reporting to ED immediately if experiences.  - F/U 3-4 weeks   Acne, unspecified acne type - instructions given on proper use.  - tretinoin (RETIN-A) 0.1 % cream; Apply topically at bedtime.  Dispense: 45 g; Refill: 0   Reviewed expectations re: course of current medical issues.  Discussed self-management of symptoms.  Outlined signs and symptoms indicating need for more acute intervention.  Patient verbalized understanding and all questions were answered.  Patient received an After-Visit Summary.   Note is dictated utilizing voice recognition software. Although note has been proof read prior to signing, occasional typographical errors still can be missed. If any questions arise, please do not hesitate to call for verification.   electronically signed by:  Howard Pouch, DO  [place]

## 2016-07-26 ENCOUNTER — Emergency Department (HOSPITAL_COMMUNITY)
Admission: EM | Admit: 2016-07-26 | Discharge: 2016-07-26 | Discharge status: Against Medical Advice | Payer: Managed Care, Other (non HMO) | Attending: Emergency Medicine | Admitting: Emergency Medicine

## 2016-07-26 ENCOUNTER — Telehealth: Payer: Self-pay | Admitting: Family Medicine

## 2016-07-26 DIAGNOSIS — L709 Acne, unspecified: Secondary | ICD-10-CM | POA: Insufficient documentation

## 2016-07-26 DIAGNOSIS — Z452 Encounter for adjustment and management of vascular access device: Secondary | ICD-10-CM

## 2016-07-26 DIAGNOSIS — I10 Essential (primary) hypertension: Secondary | ICD-10-CM | POA: Insufficient documentation

## 2016-07-26 LAB — MICROALBUMIN / CREATININE URINE RATIO
Creatinine, Urine: 140 mg/dL (ref 20–320)
MICROALB UR: 28.5 mg/dL
MICROALB/CREAT RATIO: 204 ug/mg{creat} — AB (ref <30)

## 2016-07-26 NOTE — ED Notes (Signed)
Pt called out stating she wanted  To go home because she was exhausted and needed to talk her meds. This RN informed the pt that I will talk to Dr. Blinda Leatherwood to get discharge orders. This rn returned tot he room to inform the pt and the pt was gone.

## 2016-07-26 NOTE — ED Notes (Signed)
Left while nurse was in another room.  Unable to get AMA signature.

## 2016-07-26 NOTE — Telephone Encounter (Signed)
Spoke with patient reviewed lab results . Patient asked if her blood pressure was elevated and didn't go down after she took her medication could she take another dose. Advised patient not to increase her medication. If its continues to be elevated after taking medication for several days call back she may need to be re evaluated. Patient verbalized understanding. If any other symptoms associated with elevated BP (stroke signs) report to ED. Patient understands all instructions.

## 2016-07-26 NOTE — ED Provider Notes (Signed)
[facility] DEPT Provider Note   CSN: [id number] Arrival date & time: 07/25/16  2159     History   Chief Complaint Chief Complaint  Patient presents with  . Post-op Problem    Patient has PICC Line right AC. She noticed it is red at insertion site about 2h ago.    HPI Kara [name] is a 61 y.o. female.  Patient presents to the ER with concerns over her PIC line. Patient has a right antecubital fossa PICC line that was placed 3 weeks ago. She has ongoing antibiotic therapy for postoperative infection after cervical spine surgery. Today she noticed redness around the PICC line with drainage of pus. She has not had any fevers.      Past Medical History:  Diagnosis Date  . ADD (attention deficit disorder)    on Adderal  . Aggressive behavior of adult   . Allergy   . Anxiety   . Arthritis   . Asthma    as a child  . Cellulitis of breast 11/2013   RIGHT BREAST  . Chronic fatigue and immune dysfunction syndrome ([place])   . Chronic kidney disease    due to infection from stone- no regular visits- fine now  . Chronic pain    goes to [facility] for pain control  . Cold sore   . Confusion caused by a drug ([place])    methotrexate and autoimmune disease   . Depression    takes meds daily  . Family history of malignant neoplasm of breast   . Fibromyalgia   . Nephrolithiasis   . Osteoporosis   . Other specified rheumatoid arthritis, right shoulder ([place]) 08/01/2011  . Post-nasal drip    hx of  . Rheumatoid arthritis(714.0)    autoimmune arthritis; methotrexate once/week  . Urinary incontinence     Patient Active Problem List   Diagnosis Date Noted  . DNR (do not resuscitate) discussion   . Palliative care by specialist   . Post-operative infection 07/06/2016  . SIRS (systemic inflammatory response syndrome) ([place]) 07/05/2016  . Wound infection 07/05/2016  . Pyuria 07/05/2016  . Cervical pseudoarthrosis ([place]) 06/21/2016  . Opioid dependence in remission ([place])  06/14/2016  . Mitral valve prolapse 01/04/2016  . Skin infection 12/22/2015  . History of recent hospitalization 12/22/2015  . Altered mental status 12/13/2015  . HNP (herniated nucleus pulposus), lumbar 12/10/2015  . Prediabetes 08/19/2015  . BMI 26.0-26.9,adult 08/18/2015  . Chronic pain 08/18/2015  . Vitamin D deficiency 08/18/2015  . Atypical chest pain 08/18/2015  . Elevated BP 08/18/2015  . Chronic fatigue disorder 06/14/2015  . Nail fungus 06/14/2015  . S/P cervical spinal fusion 03/15/2015  . Spinal surgery in prior 3 months 07/03/2014  . PTSD (post-traumatic stress disorder) 03/07/2014  . Severe recurrent major depression without psychotic features ([place]) 03/06/2014  . Suicide threat or attempt 03/06/2014  . Agitation 03/06/2014  . Aggressive behavior   . Family history of malignant neoplasm of breast   . Rheumatoid arthritis ([place]) 12/13/2011  . Fibromyalgia 12/13/2011    Past Surgical History:  Procedure Laterality Date  . ANTERIOR CERVICAL DECOMP/DISCECTOMY FUSION N/A 03/15/2015   Procedure: Cervical five-six, Cerival six-seven, Anterior Cervical Discectomy and Fusion, Allograft and Plate;  Surgeon: Marybelle Killings, MD;  Location: [facility];  Service: Orthopedics;  Laterality: N/A;  . BACK SURGERY  2011,16   lower back fusion l4-5, s1  . BLADDER SURGERY  2009  . BREAST ENHANCEMENT SURGERY  2003  . breast implants removed    .  BREAST SURGERY  10/2013   REMOVAL OF BREAST IMPLANTS  . CERVICAL WOUND DEBRIDEMENT N/A 07/07/2016   Procedure: IRRIGATION AND DEBRIDEMENT POSTERIOR NECK;  Surgeon: Marybelle Killings, MD;  Location: [facility];  Service: Orthopedics;  Laterality: N/A;  . INCISION AND DRAINAGE ABSCESS Right 01/16/2014   Procedure: INCISION AND DRAINAGE AND OF RIGHT BREAST ABCESS;  Surgeon: Excell Seltzer, MD;  Location: [facility];  Service: General;  Laterality: Right;  . JOINT REPLACEMENT    . LIPOSUCTION  2003   abdominal  . LUMBAR LAMINECTOMY/DECOMPRESSION MICRODISCECTOMY N/A  12/10/2015   Procedure: Right L3-4 Hemilaminectomy, Excision of herniated nucleus pulposus;  Surgeon: Marybelle Killings, MD;  Location: [facility];  Service: Orthopedics;  Laterality: N/A;  . MASS EXCISION  11/03/2011   Procedure: MINOR EXCISION OF MASS;  Surgeon: Cammie Sickle., MD;  Location: [facility];  Service: Orthopedics;  Laterality: Left;  debride IP joint, cyst excision left index  . POSTERIOR CERVICAL FUSION/FORAMINOTOMY N/A 06/21/2016   Procedure: POSTERIOR CERVICAL FUSION C5-C7 SPINOUS PROCESS WIRING;  Surgeon: Marybelle Killings, MD;  Location: [facility];  Service: Orthopedics;  Laterality: N/A;  . POSTERIOR FUSION CERVICAL SPINE  06/21/2016   C5 C7  . SHOULDER SURGERY  2012   right  . TONSILLECTOMY  1987  . TOTAL SHOULDER ARTHROPLASTY  08/01/2011   Procedure: TOTAL SHOULDER ARTHROPLASTY;  Surgeon: Johnny Bridge, MD;  Location: [facility];  Service: Orthopedics;  Laterality: Right;  Right total shoulder arthroplasty  . TUBAL LIGATION  1988    OB History    No data available       Home Medications    Prior to Admission medications   Medication Sig Start Date End Date Taking? Authorizing Provider  ACTEMRA 162 MG/0.9ML SOSY Has stopped prior to procedure 04/11/16   Historical Provider, MD  ADDERALL XR 30 MG 24 hr capsule Take 60 mg by mouth daily. Takes 2 capsules in the morning 11/02/15   Historical Provider, MD  ALPRAZolam Duanne Moron) 1 MG tablet Take 1 mg by mouth 3 (three) times daily as needed for anxiety.  07/06/14   Historical Provider, MD  amLODipine (NORVASC) 5 MG tablet Take 0.5-1 tablets (2.5-5 mg total) by mouth daily. 07/25/16   Renee A Kuneff, DO  amphetamine-dextroamphetamine (ADDERALL) 30 MG tablet Take 30 mg by mouth daily. Before 4 pm 04/29/16   Historical Provider, MD  ceFAZolin (ANCEF) IVPB Inject 2 g into the vein every 8 (eight) hours. Indication:  Wound infection Last Day of Therapy:  08/16/16 Labs - Once weekly:  CBC/D and BMP, Labs - Every other week:  ESR and CRP  07/10/16   Marybelle Killings, MD  cyclobenzaprine (FLEXERIL) 10 MG tablet TAKE 1 TABLET (10 MG TOTAL) BY MOUTH 3 (THREE) TIMES DAILY AS NEEDED FOR MUSCLE SPASMS. Patient not taking: Reported on 07/25/2016 02/11/16   Marybelle Killings, MD  diclofenac (VOLTAREN) 75 MG EC tablet Take 1 tablet (75 mg total) by mouth 2 (two) times daily. 05/02/16   Marybelle Killings, MD  Diclofenac Sodium (PENNSAID) 2 % SOLN Place 2 g onto the skin 4 (four) times daily as needed. Patient taking differently: Place 2 g onto the skin 2 (two) times daily as needed (joint pain).  02/24/16   Leandrew Koyanagi, MD  FLUoxetine (PROZAC) 10 MG capsule  06/27/16   Historical Provider, MD  methocarbamol (ROBAXIN) 500 MG tablet Take 1 tablet (500 mg total) by mouth 4 (four) times daily. Patient taking differently:  Take 500 mg by mouth every 6 (six) hours as needed for muscle spasms.  06/22/16   Marybelle Killings, MD  oxyCODONE-acetaminophen (PERCOCET/ROXICET) 5-325 MG tablet Take 1 tablet by mouth every 8 (eight) hours as needed for severe pain. 07/17/16   Marybelle Killings, MD  rifampin (RIFADIN) 300 MG capsule Take 1 capsule (300 mg total) by mouth daily. 07/11/16   Kshitiz Starla Link, MD  tretinoin (RETIN-A) 0.1 % cream Apply topically at bedtime. 07/25/16   Renee A Kuneff, DO  venlafaxine XR (EFFEXOR-XR) 75 MG 24 hr capsule Take 150 mg by mouth daily with breakfast.     Historical Provider, MD  zolpidem (AMBIEN) 10 MG tablet TAKE 1 TABLET (10 mg) BY MOUTH AT BEDTIME AS NEEDED for sleep 05/23/15   Historical Provider, MD    Family History Family History  Problem Relation Age of Onset  . Arthritis Mother   . Heart disease Mother     ?psvt  . Breast cancer Mother 3    TAH/BSO  . Cancer Mother   . Mental illness Mother   . COPD Father   . Hypertension Father   . Alcohol abuse Father   . Mental illness Father   . Heart disease Father   . Healthy Daughter   . Breast cancer Maternal Aunt 27    deceased  . Cancer Cousin 6    female; unknown primary  . Colon cancer  Paternal Aunt 37    deceased at 12  . Stomach cancer Paternal Uncle 56    deceased at 34  . Alcohol abuse Brother   . Cancer Brother   . Hodgkin's lymphoma Brother   . Mental illness Brother   . HIV Brother   . Healthy Brother   . Healthy Son     Social History Social History  Substance Use Topics  . Smoking status: Former Smoker    Packs/day: 0.50    Years: 10.00    Types: Cigarettes    Quit date: 12/06/2005  . Smokeless tobacco: Never Used     Comment: smoked for 10 years  . Alcohol use No     Comment: denied alcohol     Allergies   Aspirin; Ibuprofen; Ativan [lorazepam]; Ketamine; Toradol [ketorolac tromethamine]; and Tramadol hcl   Review of Systems Review of Systems  Skin: Positive for wound.  All other systems reviewed and are negative.    Physical Exam Updated Vital Signs BP (!) 171/95   Pulse 83   Temp 98.8 F (37.1 C) (Oral)   Resp 17   SpO2 98%   Physical Exam  Constitutional: She is oriented to person, place, and time. She appears well-developed and well-nourished. No distress.  HENT:  Head: Normocephalic and atraumatic.  Right Ear: Hearing normal.  Left Ear: Hearing normal.  Nose: Nose normal.  Mouth/Throat: Oropharynx is clear and moist and mucous membranes are normal.  Eyes: Conjunctivae and EOM are normal. Pupils are equal, round, and reactive to light.  Neck: Normal range of motion. Neck supple.  Cardiovascular: Regular rhythm, S1 normal and S2 normal.  Exam reveals no gallop and no friction rub.   No murmur heard. Pulmonary/Chest: Effort normal and breath sounds normal. No respiratory distress. She exhibits no tenderness.  Abdominal: Soft. Normal appearance and bowel sounds are normal. There is no hepatosplenomegaly. There is no tenderness. There is no rebound, no guarding, no tenderness at McBurney's point and negative Murphy's sign. No hernia.  Musculoskeletal: Normal range of motion.  Neurological: She is alert  and oriented to person,  place, and time. She has normal strength. No cranial nerve deficit or sensory deficit. Coordination normal. GCS eye subscore is 4. GCS verbal subscore is 5. GCS motor subscore is 6.  Skin: Skin is warm, dry and intact. No rash noted. No cyanosis.  PICC line right antecubital fossa with 0.75 cm circular area of erythema, slight induration, no obvious purulent drainage at this time  Psychiatric: She has a normal mood and affect. Her speech is normal and behavior is normal. Thought content normal.  Nursing note and vitals reviewed.    ED Treatments / Results  Labs (all labs ordered are listed, but only abnormal results are displayed) Labs Reviewed - No data to display  EKG  EKG Interpretation None       Radiology No results found.  Procedures Procedures (including critical care time)  Medications Ordered in ED Medications - No data to display   Initial Impression / Assessment and Plan / ED Course  I have reviewed the triage vital signs and the nursing notes.  Pertinent labs & imaging results that were available during my care of the patient were reviewed by me and considered in my medical decision making (see chart for details).     Patient presents to the ER with concerns of possible infection of her PICC line. She did have a very small circular area of erythema surrounding the PICC line that looks more like your dictation from the PICC line being in for 3 weeks. She reports, however, that there was purulent drainage from earlier today. No purulence is expressed from the area today. While arrangements were being made to have the PICC line removed, tip cultured and an IV placed as well as schedule her for repeat PICC line placement tomorrow, patient eloped from the ER. I do not feel that she has any significant infection at this time, she does have advance home nurse coming to evaluate her in the morning.  Final Clinical Impressions(s) / ED Diagnoses   Final diagnoses:  PICC  (peripherally inserted central catheter) in place    New Prescriptions Discharge Medication List as of 07/26/2016  2:21 AM       Orpah Greek, MD 07/26/16 [phone]

## 2016-07-26 NOTE — Telephone Encounter (Signed)
Please call pt: - her thyroid is functioning normally.  - her urine study did show signs of microalbuminuria. This can be reversed by gaining control of her BP as we discussed yesterday.

## 2016-07-27 ENCOUNTER — Telehealth (INDEPENDENT_AMBULATORY_CARE_PROVIDER_SITE_OTHER): Payer: Self-pay | Admitting: Orthopaedic Surgery

## 2016-07-27 NOTE — Telephone Encounter (Signed)
Ok thanks 

## 2016-07-27 NOTE — Telephone Encounter (Signed)
Ok for school note.  

## 2016-07-27 NOTE — Telephone Encounter (Signed)
Patient called left voicemail message needing a note for school. 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 Patient states she was on medication and could not attend class. The number to contact patient is [phone]

## 2016-07-28 ENCOUNTER — Telehealth: Payer: Self-pay | Admitting: Family Medicine

## 2016-07-28 NOTE — Telephone Encounter (Signed)
Please call pt: - the reported BP,  Where was it taken (in her home) at an appt? Was it at least 2 hours after taking medication? - Has she seen BP readings below 140/90 with medication or has she not reached BP goal at all since starting amlodipine? - what is lowest BP reading she has seen since starting medicine?

## 2016-07-28 NOTE — Telephone Encounter (Signed)
When accessed today, patient's blood pressure was taken and was 152/110.  It was retaken after 15 minutes and the diastolic was down to 100.  Patient reports she did take her amLODipine (NORVASC) 5 MG tablet today.

## 2016-07-28 NOTE — Telephone Encounter (Signed)
Note for school done and emailed per patient request to [email or web address]  Patient would like to know if you would write her a prescription of the pain medication just so that she can take it with her on her trip to [place]. She does not want to have to visit the ED in [place] if possible. Her low back is what is bothering her the most. She states that she is only taking Tylenol now and has one of the Oxycodone left. Please advise.

## 2016-07-28 NOTE — Telephone Encounter (Signed)
She can increase the dose to 1.5 tabs (7.5 mg QD) and monitor BP. Goal is <140/90 and but too low to cause dizziness <100/50 etc

## 2016-07-28 NOTE — Telephone Encounter (Signed)
Spoke with patient reviewed instructions. Patient verbalized understanding she will call on Monday with update on blood pressure readings.

## 2016-07-28 NOTE — Telephone Encounter (Signed)
Ok to print Rx for # 8 tabs percocet 5/325 1 po bid prn pain. thanks

## 2016-07-28 NOTE — Telephone Encounter (Signed)
Patient states her BP was taken by the Home Health Nurse([facility]) she states it was 2 hours after she took her medication. She states the lowest reading she has had since starting medication is 152/90.

## 2016-07-31 ENCOUNTER — Telehealth (INDEPENDENT_AMBULATORY_CARE_PROVIDER_SITE_OTHER): Payer: Self-pay

## 2016-07-31 ENCOUNTER — Telehealth (INDEPENDENT_AMBULATORY_CARE_PROVIDER_SITE_OTHER): Payer: Self-pay | Admitting: Radiology

## 2016-07-31 ENCOUNTER — Telehealth: Payer: Self-pay

## 2016-07-31 ENCOUNTER — Encounter: Payer: Self-pay | Admitting: Infectious Diseases

## 2016-07-31 MED ORDER — OXYCODONE-ACETAMINOPHEN 5-325 MG PO TABS: 1.0000 | ORAL_TABLET | Freq: Two times a day (BID) | ORAL | 0 refills | Status: DC | PRN

## 2016-07-31 NOTE — Telephone Encounter (Signed)
Spoke with patient she was not increasing her amlodipine as we dicussed previously. Reviewed with patient she needs to check her BP every am if BP is over 140/90 she is to take 1.5 tabs(7.5mg ) and recheck BP 2 hours after taking . If Bp is <140/90 she can take 1 tablet. If any dizziness and BP is <100/50 do not take medication. Patient is to keep records of daily BP and follow up if continues to have high reading. Patient verbalized understanding.

## 2016-07-31 NOTE — Telephone Encounter (Signed)
Script entered and placed up front for pick up

## 2016-07-31 NOTE — Telephone Encounter (Signed)
Patient would like a call back concerning her high blood pressure and her IV medication.  Stated that it has been high for the past 2wks (198/101)  And she has been taking her medication for her blood pressure.  Please Advise. CB# is [phone]

## 2016-07-31 NOTE — Telephone Encounter (Signed)
PLEASE NOTE: All timestamps contained within this report are represented as [place] Standard Time. CONFIDENTIALTY NOTICE: This fax transmission is intended only for the addressee. It contains information that is legally privileged, confidential or otherwise protected from use or disclosure. If you are not the intended recipient, you are strictly prohibited from reviewing, disclosing, copying using or disseminating any of this information or taking any action in reliance on or regarding this information. If you have received this fax in error, please notify us immediately by telephone so that we can arrange for its return to [place]. Phone: [phone], Toll-Free: [phone], Fax: [phone] Page: 1 of 2 Call Id: [id number] [name] Night - Client TELEPHONE ADVICE RECORD [facility] Patient Name: Kara [name] Gender: Female DOB: 02-01-1956 Age: 61 Y 10 M 13 D Return Phone Number: [phone] (Primary), [phone] (Secondary) City/State/Zip: [place] [address] Client [facility] Night - Client Client Site  [name] Kara [name] Who Is Calling Patient / Member / Family / Caregiver Call Type Triage / Clinical Caller Name Kara [name] Relationship To Patient Spouse Return Phone Number [phone] (Primary) Chief Complaint Blood Pressure High Reason for Call Symptomatic / Request for Health Information Initial Comment Caller states her BP is running high 189/101. Dr. gave her Amlodipine 5mg  1/2 to 1 tablet per day 07/25/16 . BP is still running high.on Additional Comment HA and Tunnel vision Nurse Assessment Nurse: 09/24/16, RN, Kara [name] Date/Time (Eastern Time): 07/29/2016 10:17:12 AM Confirm and document reason for call. If symptomatic, describe symptoms. ---caller states her blood pressure is running high. Before neck fusion infection with staph with wound vac and picc line for abx TID. Has been  taking Amlodipine since Tuesday, 2.5- 5 mg per day and has been taking 1 tablet but took 2 yesterday per doctor order. B/P 153/101 this morning. Tunnel vision reported and dizziness. Does the PT have any chronic conditions? (i.e. diabetes, asthma, etc.) ---Yes List chronic conditions. ---chronic fatigue Degenerative disc disease Chronic Pain RA Guidelines Guideline Title Affirmed Question High Blood Pressure Systolic BP >= 160 OR Diastolic >= 100 Disp. Time Sunday Time) Disposition Final User 07/29/2016 10:32:15 AM See PCP When Office is Open (within 3 days) Yes 09/28/2016, RN, Kara [name] Advice Given Per Guideline CALL BACK IF: * Weakness or numbness of the face, arm or leg on one side of the body occurs * Difficulty walking, difficulty talking, or severe headache occurs * Chest pain or difficulty breathing occurs * Your blood pressure is over 180/110 * You become worse. CARE ADVICE given per High Blood Pressure (Adult) guideline. Comments User: Kara [facility], RN Date/Time Kara [name] Time): 07/29/2016 10:25:28 AM PLEASE NOTE: All timestamps contained within this report are represented as 09/28/2016 Standard Time. CONFIDENTIALTY NOTICE: This fax transmission is intended only for the addressee. It contains information that is legally privileged, confidential or otherwise protected from use or disclosure. If you are not the intended recipient, you are strictly prohibited from reviewing, disclosing, copying using or disseminating any of this information or taking any action in reliance on or regarding this information. If you have received this fax in error, please notify [place] immediately by telephone so that we can arrange for its return to [place]. Phone: [phone], Toll-Free: [phone], Fax: [phone] Page: 2 of 2 Call Id: [phone] Comments left hand tingles at times...s/p Cervical Fusion 3/28, 2nd surgery April 7th User: [id number], RN Date/Time Kara [name] Time): 07/29/2016 10:26:18 AM Did have  chest pain last  night User: Kara Lorenzo, RN Date/Time Kara [name] Time): 07/29/2016 10:33:42 AM pt now states that MD did NOT tell her to take an extra AMlodipine that she did that on her own. Instructed to call us before taking any extra medication and let us talk to provider about medications that may or may not need to be changed.she verbalized understanding User: Kara Lorenzo, RN Date/Time Kara [name] Time): 07/29/2016 10:34:51 AM stated she did have some chest pain last night but calmed herself with meditation. Stated she is scared as her blood pressure has never been this high.

## 2016-07-31 NOTE — Telephone Encounter (Signed)
Patient would like to speak with you regarding her blood pressure after IV meds. Thanks.

## 2016-07-31 NOTE — Telephone Encounter (Signed)
I called discussed.  

## 2016-07-31 NOTE — Telephone Encounter (Signed)
I called discussed. She will call LMD to find out if need to increase Norvasc to 10 mg instead of 5 and consider adding a second med.

## 2016-07-31 NOTE — Addendum Note (Signed)
Addended by: Rogers Seeds on: 07/31/2016 11:37 AM   Modules accepted: Orders

## 2016-07-31 NOTE — Telephone Encounter (Signed)
Lauren, RN with [facility], called and states that when she arrived to patient's house today for vac change, the wound vac had been taken down by patient's husband due to pain yesterday. She did state that the vac has been causing pain and the patient is having increased blood pressure. There is some swelling and puffiness around the incision, but the wound looks okay. The patient is also out of pain medication and is needing more.   Lauren reapplied the sponge vac and states the patient told her she would not remove it. She would like to know if she could possibly switch her to a gauze vac and see if that alleviates the pain.  Patient was made a work in appt for tomorrow to check the puffiness and swelling around the incision.  I did advise that the script for Percocet for her trip to [place] has been signed and is available up front for pick up.   Lauren would like to know if the patient could have a script for pain med at home.   Lauren CB-[phone]

## 2016-08-01 ENCOUNTER — Encounter (INDEPENDENT_AMBULATORY_CARE_PROVIDER_SITE_OTHER): Payer: Self-pay | Admitting: Orthopaedic Surgery

## 2016-08-01 ENCOUNTER — Ambulatory Visit (INDEPENDENT_AMBULATORY_CARE_PROVIDER_SITE_OTHER): Payer: Self-pay | Admitting: Orthopaedic Surgery

## 2016-08-01 ENCOUNTER — Ambulatory Visit (INDEPENDENT_AMBULATORY_CARE_PROVIDER_SITE_OTHER): Payer: Managed Care, Other (non HMO)

## 2016-08-01 VITALS — BP 156/101 | HR 97

## 2016-08-01 DIAGNOSIS — M545 Low back pain: Secondary | ICD-10-CM

## 2016-08-01 DIAGNOSIS — Z981 Arthrodesis status: Secondary | ICD-10-CM

## 2016-08-01 DIAGNOSIS — T148XXA Other injury of unspecified body region, initial encounter: Secondary | ICD-10-CM

## 2016-08-01 DIAGNOSIS — L089 Local infection of the skin and subcutaneous tissue, unspecified: Secondary | ICD-10-CM

## 2016-08-01 DIAGNOSIS — S129XXD Fracture of neck, unspecified, subsequent encounter: Secondary | ICD-10-CM

## 2016-08-01 NOTE — Progress Notes (Signed)
Office Visit Note   Patient: Kara [name]           Date of Birth: 1955/08/27           MRN: [id number] Visit Date: 08/01/2016              Requested by: Natalia Leatherwood, DO [address] [place], [place] [address] PCP: Natalia Leatherwood, DO   Assessment & Plan: Visit Diagnoses:  1. S/P lumbar fusion   2. Wound infection   3. Pseudoarthrosis of cervical spine, subsequent encounter     Plan: Continue VAC return in 2 weeks for possible  debridement of some necrotic tissue. She has a trip planned to [place] June 7 continue antibiotics by PICC line. Continue VAC changes.  Follow-Up Instructions: Return in about 2 weeks (around 08/15/2016).   Orders:  Orders Placed This Encounter  Procedures  . XR Lumbar Spine 2-3 Views   No orders of the defined types were placed in this encounter.     Procedures: No procedures performed   Clinical Data: No additional findings.   Subjective: Chief Complaint  Patient presents with  . Neck - Follow-up    HPI patient returns skin back changes had posterior cervical fusion and had some vancomycin place but still developed a postoperative infection requiring repeat surgery with debridement and VAC placement. Patient's had some recurrent low back pain she did well when she had massages. She had the decompression and microdiscectomy at L3-4 with hemilaminectomy above the fusion. No evidence of recurrent radiculopathy.  Review of Systems unchanged from last office visit.   Objective: Vital Signs: BP (!) 156/101   Pulse 97   Physical Exam no cellulitis. Back has good seal. Neurologically intact upper extremities. She has some tenderness lumbosacral junction. Normal gait. Ortho Exam  Specialty Comments:  No specialty comments available.  Imaging: Xr Lumbar Spine 2-3 Views  Result Date: 08/01/2016 AP lateral lumbar spine x-rays demonstrate instrumented fusion from L4-S1. Cages are in satisfactory position. Impression: Status post L4-S1 fusion  no evidence of loosening and cages are in satisfactory position.    PMFS History: Patient Active Problem List   Diagnosis Date Noted  . Essential hypertension 07/26/2016  . Acne 07/26/2016  . DNR (do not resuscitate) discussion   . Palliative care by specialist   . Post-operative infection 07/06/2016  . SIRS (systemic inflammatory response syndrome) (HCC) 07/05/2016  . Wound infection 07/05/2016  . Cervical pseudoarthrosis (HCC) 06/21/2016  . Opioid dependence in remission (HCC) 06/14/2016  . Mitral valve prolapse 01/04/2016  . Skin infection 12/22/2015  . HNP (herniated nucleus pulposus), lumbar 12/10/2015  . Prediabetes 08/19/2015  . BMI 26.0-26.9,adult 08/18/2015  . Chronic pain 08/18/2015  . Vitamin D deficiency 08/18/2015  . Elevated BP 08/18/2015  . Chronic fatigue disorder 06/14/2015  . S/P cervical spinal fusion 03/15/2015  . Spinal surgery in prior 3 months 07/03/2014  . PTSD (post-traumatic stress disorder) 03/07/2014  . Severe recurrent major depression without psychotic features (HCC) 03/06/2014  . Suicide threat or attempt 03/06/2014  . Aggressive behavior   . Family history of malignant neoplasm of breast   . Rheumatoid arthritis (HCC) 12/13/2011  . Fibromyalgia 12/13/2011   Past Medical History:  Diagnosis Date  . ADD (attention deficit disorder)    on Adderal  . Aggressive behavior of adult   . Allergy   . Anxiety   . Arthritis   . Asthma    as a child  . Cellulitis of breast 11/2013  RIGHT BREAST  . 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 Chronic kidney disease    due to infection from stone- no regular visits- fine now  . Chronic pain    goes to [facility] control  . Cold sore   . 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 Depression    takes meds daily  . Family history of malignant neoplasm of breast   . Fibromyalgia   . Nephrolithiasis   . Osteoporosis   . Other  specified rheumatoid arthritis, right shoulder (HCC) 08/01/2011  . Post-nasal drip    hx of  . Rheumatoid arthritis(714.0)    autoimmune arthritis; methotrexate once/week  . Urinary incontinence     Family History  Problem Relation Age of Onset  . Arthritis Mother   . Heart disease Mother     ?psvt  . Breast cancer Mother 40    TAH/BSO  . Cancer Mother   . Mental illness Mother   . COPD Father   . Hypertension Father   . Alcohol abuse Father   . Mental illness Father   . Heart disease Father   . Healthy Daughter   . Breast cancer Maternal Aunt 27    deceased  . Cancer Cousin 29    female; unknown primary  . Colon cancer Paternal Aunt 65    deceased at 44  . Stomach cancer Paternal Uncle 38    deceased at 80  . Alcohol abuse Brother   . Cancer Brother   . Hodgkin's lymphoma Brother   . Mental illness Brother   . HIV Brother   . Healthy Brother   . Healthy Son     Past Surgical History:  Procedure Laterality Date  . ANTERIOR CERVICAL DECOMP/DISCECTOMY FUSION N/A 03/15/2015   Procedure: Cervical five-six, Cerival six-seven, Anterior Cervical Discectomy and Fusion, Allograft and Plate;  Surgeon: Eldred Manges, MD;  [facility]: [facility];  Service: Orthopedics;  Laterality: N/A;  . BACK SURGERY  2011,16   lower back fusion l4-5, s1  . BLADDER SURGERY  2009  . BREAST ENHANCEMENT SURGERY  2003  . breast implants removed    . BREAST SURGERY  10/2013   REMOVAL OF BREAST IMPLANTS  . CERVICAL WOUND DEBRIDEMENT N/A 07/07/2016   Procedure: IRRIGATION AND DEBRIDEMENT POSTERIOR NECK;  Surgeon: Eldred Manges, MD;  Location: [facility];  Service: Orthopedics;  Laterality: N/A;  . INCISION AND DRAINAGE ABSCESS Right 01/16/2014   Procedure: INCISION AND DRAINAGE AND OF RIGHT BREAST ABCESS;  Surgeon: Glenna Fellows, MD;  Location: [facility];  Service: General;  Laterality: Right;  . JOINT REPLACEMENT    . LIPOSUCTION  2003   abdominal  . LUMBAR LAMINECTOMY/DECOMPRESSION MICRODISCECTOMY N/A  12/10/2015   Procedure: Right L3-4 Hemilaminectomy, Excision of herniated nucleus pulposus;  Surgeon: Eldred Manges, MD;  Location[facility];  Service: Orthopedics;  Laterality: N/A;  . MASS EXCISION  11/03/2011   Procedure: MINOR EXCISION OF MASS;  Surgeon: Wyn Forster., MD;  Location: [facility];  Service: Orthopedics;  Laterality: Left;  debride IP joint, cyst excision left index  . POSTERIOR CERVICAL FUSION/FORAMINOTOMY N/A 06/21/2016   Procedure: POSTERIOR CERVICAL FUSION C5-C7 SPINOUS PROCESS WIRING;  Surgeon: Eldred Manges, MD;  [facility];  Service: Orthopedics;  Laterality: N/A;  . POSTERIOR FUSION CERVICAL SPINE  06/21/2016   C5 C7  . SHOULDER SURGERY  2012  right  . TONSILLECTOMY  1987  . TOTAL SHOULDER ARTHROPLASTY  08/01/2011   Procedure: TOTAL SHOULDER ARTHROPLASTY;  Surgeon: Eulas Post, MD;  Location: [facility];  Service: Orthopedics;  Laterality: Right;  Right total shoulder arthroplasty  . TUBAL LIGATION  1988   Social History   Occupational History  . Not on file.   Social History Main Topics  . Smoking status: Former Smoker    Packs/day: 0.50    Years: 10.00    Types: Cigarettes    Quit date: 12/06/2005  . Smokeless tobacco: Never Used     Comment: smoked for 10 years  . Alcohol use No     Comment: denied alcohol  . Drug use: No     Comment: denied any drug use with admission nurse  . Sexual activity: Yes    Birth control/ protection: Post-menopausal

## 2016-08-02 ENCOUNTER — Emergency Department (HOSPITAL_COMMUNITY)
Admission: EM | Admit: 2016-08-02 | Discharge: 2016-08-02 | Disposition: A | Discharge status: Home / Self Care | Payer: Managed Care, Other (non HMO) | Attending: Emergency Medicine | Admitting: Emergency Medicine

## 2016-08-02 ENCOUNTER — Encounter (HOSPITAL_COMMUNITY): Payer: Self-pay | Admitting: Nurse Practitioner

## 2016-08-02 ENCOUNTER — Telehealth: Payer: Self-pay | Admitting: Infectious Diseases

## 2016-08-02 DIAGNOSIS — J45909 Unspecified asthma, uncomplicated: Secondary | ICD-10-CM | POA: Diagnosis not present

## 2016-08-02 DIAGNOSIS — Z96611 Presence of right artificial shoulder joint: Secondary | ICD-10-CM | POA: Diagnosis not present

## 2016-08-02 DIAGNOSIS — N189 Chronic kidney disease, unspecified: Secondary | ICD-10-CM | POA: Diagnosis not present

## 2016-08-02 DIAGNOSIS — Z87891 Personal history of nicotine dependence: Secondary | ICD-10-CM | POA: Insufficient documentation

## 2016-08-02 DIAGNOSIS — I129 Hypertensive chronic kidney disease with stage 1 through stage 4 chronic kidney disease, or unspecified chronic kidney disease: Secondary | ICD-10-CM | POA: Insufficient documentation

## 2016-08-02 DIAGNOSIS — F909 Attention-deficit hyperactivity disorder, unspecified type: Secondary | ICD-10-CM | POA: Insufficient documentation

## 2016-08-02 DIAGNOSIS — Z4801 Encounter for change or removal of surgical wound dressing: Secondary | ICD-10-CM | POA: Insufficient documentation

## 2016-08-02 DIAGNOSIS — Z4889 Encounter for other specified surgical aftercare: Secondary | ICD-10-CM

## 2016-08-02 DIAGNOSIS — Z79899 Other long term (current) drug therapy: Secondary | ICD-10-CM | POA: Diagnosis not present

## 2016-08-02 LAB — BASIC METABOLIC PANEL
Anion gap: 11 (ref 5–15)
BUN: 13 mg/dL (ref 6–20)
CHLORIDE: 102 mmol/L (ref 101–111)
CO2: 24 mmol/L (ref 22–32)
Calcium: 9.6 mg/dL (ref 8.9–10.3)
Creatinine, Ser: 0.59 mg/dL (ref 0.44–1.00)
GFR calc Af Amer: >60 mL/min (ref >60)
GFR calc non Af Amer: >60 mL/min (ref >60)
Glucose, Bld: 116 mg/dL — ABNORMAL HIGH (ref 65–99)
POTASSIUM: 3.2 mmol/L — AB (ref 3.5–5.1)
Sodium: 137 mmol/L (ref 135–145)

## 2016-08-02 LAB — I-STAT CG4 LACTIC ACID, ED: Lactic Acid, Venous: 1.18 mmol/L (ref 0.5–1.9)

## 2016-08-02 LAB — CBC
HEMATOCRIT: 36.7 % (ref 36.0–46.0)
HEMOGLOBIN: 11.9 g/dL — AB (ref 12.0–15.0)
MCH: 27.4 pg (ref 26.0–34.0)
MCHC: 32.4 g/dL (ref 30.0–36.0)
MCV: 84.6 fL (ref 78.0–100.0)
Platelets: 373 10*3/uL (ref 150–400)
RBC: 4.34 MIL/uL (ref 3.87–5.11)
RDW: 12.8 % (ref 11.5–15.5)
WBC: 9.1 10*3/uL (ref 4.0–10.5)

## 2016-08-02 NOTE — Telephone Encounter (Signed)
Called by [facility] that pt's wound is worsening. She was seen by her surgeon yesterday. I advised her to go to ED or be seen again by her surgeon.

## 2016-08-02 NOTE — ED Provider Notes (Signed)
[facility] DEPT Provider Note   CSN: [id number] Arrival date & time: 08/02/16  1845     History   Chief Complaint Chief Complaint  Patient presents with  . Wound Infection    HPI Kara [name] is a 61 y.o. female.  HPI  61 y.o. female presents to the Emergency Department today due to possible wound infection. Sent to ED by home health due to worsening infection on neck from C spine surgery. Currently being treated with IV ABX for wound with PICC line as well as Wound Vac. Home health noted increased drainage, swelling, and possible necrotic areas. Seen by Surgeon yesterday (Dr. Dutch Gray) who noted necrotic areas and will likely debride in 2 weeks. Denies fevers currently, but states that she normally does not get fevers. No N/V. No CP/SOB/ABD pain. No numbness/tingling in BUE. No other symptoms noted.   Past Medical History:  Diagnosis Date  . ADD (attention deficit disorder)    on Adderal  . Aggressive behavior of adult   . Allergy   . Anxiety   . Arthritis   . Asthma    as a child  . Cellulitis of breast 11/2013   RIGHT BREAST  . Chronic fatigue and immune dysfunction syndrome ([place])   . Chronic kidney disease    due to infection from stone- no regular visits- fine now  . Chronic pain    goes to [facility] pain control  . Cold sore   . Confusion caused by a drug ([place])    methotrexate and autoimmune disease   . Depression    takes meds daily  . Family history of malignant neoplasm of breast   . Fibromyalgia   . Nephrolithiasis   . Osteoporosis   . Other specified rheumatoid arthritis, right shoulder ([place]) 08/01/2011  . Post-nasal drip    hx of  . Rheumatoid arthritis(714.0)    autoimmune arthritis; methotrexate once/week  . Urinary incontinence     Patient Active Problem List   Diagnosis Date Noted  . Essential hypertension 07/26/2016  . Acne 07/26/2016  . DNR (do not resuscitate) discussion   . Palliative care by specialist   .  Post-operative infection 07/06/2016  . SIRS (systemic inflammatory response syndrome) ([place]) 07/05/2016  . Wound infection 07/05/2016  . Cervical pseudoarthrosis ([place]) 06/21/2016  . Opioid dependence in remission ([place]) 06/14/2016  . Mitral valve prolapse 01/04/2016  . Skin infection 12/22/2015  . HNP (herniated nucleus pulposus), lumbar 12/10/2015  . Prediabetes 08/19/2015  . BMI 26.0-26.9,adult 08/18/2015  . Chronic pain 08/18/2015  . Vitamin D deficiency 08/18/2015  . Elevated BP 08/18/2015  . Chronic fatigue disorder 06/14/2015  . S/P cervical spinal fusion 03/15/2015  . Spinal surgery in prior 3 months 07/03/2014  . PTSD (post-traumatic stress disorder) 03/07/2014  . Severe recurrent major depression without psychotic features ([place]) 03/06/2014  . Suicide threat or attempt 03/06/2014  . Aggressive behavior   . Family history of malignant neoplasm of breast   . Rheumatoid arthritis ([place]) 12/13/2011  . Fibromyalgia 12/13/2011    Past Surgical History:  Procedure Laterality Date  . ANTERIOR CERVICAL DECOMP/DISCECTOMY FUSION N/A 03/15/2015   Procedure: Cervical five-six, Cerival six-seven, Anterior Cervical Discectomy and Fusion, Allograft and Plate;  Surgeon: Marybelle Killings, MD;  Location: [facility];  Service: Orthopedics;  Laterality: N/A;  . BACK SURGERY  2011,16   lower back fusion l4-5, s1  . BLADDER SURGERY  2009  . BREAST ENHANCEMENT SURGERY  2003  . breast implants removed    .  BREAST SURGERY  10/2013   REMOVAL OF BREAST IMPLANTS  . CERVICAL WOUND DEBRIDEMENT N/A 07/07/2016   Procedure: IRRIGATION AND DEBRIDEMENT POSTERIOR NECK;  Surgeon: Marybelle Killings, MD;  Location: [facility];  Service: Orthopedics;  Laterality: N/A;  . INCISION AND DRAINAGE ABSCESS Right 01/16/2014   Procedure: INCISION AND DRAINAGE AND OF RIGHT BREAST ABCESS;  Surgeon: Excell Seltzer, MD;  [facility];  Service: General;  Laterality: Right;  . JOINT REPLACEMENT    . LIPOSUCTION  2003   abdominal  .  LUMBAR LAMINECTOMY/DECOMPRESSION MICRODISCECTOMY N/A 12/10/2015   Procedure: Right L3-4 Hemilaminectomy, Excision of herniated nucleus pulposus;  Surgeon: Marybelle Killings, MD;  Location: [facility];  Service: Orthopedics;  Laterality: N/A;  . MASS EXCISION  11/03/2011   Procedure: MINOR EXCISION OF MASS;  Surgeon: Cammie Sickle., MD;  Location: [facility];  Service: Orthopedics;  Laterality: Left;  debride IP joint, cyst excision left index  . POSTERIOR CERVICAL FUSION/FORAMINOTOMY N/A 06/21/2016   Procedure: POSTERIOR CERVICAL FUSION C5-C7 SPINOUS PROCESS WIRING;  Surgeon: Marybelle Killings, MD;  Location: [facility];  Service: Orthopedics;  Laterality: N/A;  . POSTERIOR FUSION CERVICAL SPINE  06/21/2016   C5 C7  . SHOULDER SURGERY  2012   right  . TONSILLECTOMY  1987  . TOTAL SHOULDER ARTHROPLASTY  08/01/2011   Procedure: TOTAL SHOULDER ARTHROPLASTY;  Surgeon: Johnny Bridge, MD;  Location: [facility];  Service: Orthopedics;  Laterality: Right;  Right total shoulder arthroplasty  . TUBAL LIGATION  1988    OB History    No data available       Home Medications    Prior to Admission medications   Medication Sig Start Date End Date Taking? Authorizing Provider  ACTEMRA 162 MG/0.9ML SOSY Has stopped prior to procedure 04/11/16   [provider]  ADDERALL XR 30 MG 24 hr capsule Take 60 mg by mouth daily. Takes 2 capsules in the morning 11/02/15   [provider]  ALPRAZolam Duanne Moron) 1 MG tablet Take 1 mg by mouth 3 (three) times daily as needed for anxiety.  07/06/14   [provider]  amLODipine (NORVASC) 5 MG tablet Take 0.5-1 tablets (2.5-5 mg total) by mouth daily. 07/25/16   Kuneff, Renee A, DO  amphetamine-dextroamphetamine (ADDERALL) 30 MG tablet Take 30 mg by mouth daily. Before 4 pm 04/29/16   [provider]  ceFAZolin (ANCEF) IVPB Inject 2 g into the vein every 8 (eight) hours. Indication:  Wound infection Last Day of Therapy:  08/16/16 Labs - Once weekly:   CBC/D and BMP, Labs - Every other week:  ESR and CRP 07/10/16   Marybelle Killings, MD  cyclobenzaprine (FLEXERIL) 10 MG tablet TAKE 1 TABLET (10 MG TOTAL) BY MOUTH 3 (THREE) TIMES DAILY AS NEEDED FOR MUSCLE SPASMS. 02/11/16   Marybelle Killings, MD  diclofenac (VOLTAREN) 75 MG EC tablet Take 1 tablet (75 mg total) by mouth 2 (two) times daily. 05/02/16   Marybelle Killings, MD  Diclofenac Sodium (PENNSAID) 2 % SOLN Place 2 g onto the skin 4 (four) times daily as needed. Patient taking differently: Place 2 g onto the skin 2 (two) times daily as needed (joint pain).  02/24/16   Leandrew Koyanagi, MD  FLUoxetine (PROZAC) 10 MG capsule  06/27/16   [provider]  methocarbamol (ROBAXIN) 500 MG tablet Take 1 tablet (500 mg total) by mouth 4 (four) times daily. Patient taking differently: Take 500 mg by mouth every  6 (six) hours as needed for muscle spasms.  06/22/16   Marybelle Killings, MD  oxyCODONE-acetaminophen (PERCOCET) 10-325 MG tablet  07/10/16   [provider]  oxyCODONE-acetaminophen (PERCOCET/ROXICET) 5-325 MG tablet Take 1 tablet by mouth 2 (two) times daily as needed for severe pain. 07/31/16   Marybelle Killings, MD  rifampin (RIFADIN) 300 MG capsule Take 1 capsule (300 mg total) by mouth daily. 07/11/16   Aline August, MD  tretinoin (RETIN-A) 0.1 % cream Apply topically at bedtime. 07/25/16   Kuneff, Renee A, DO  venlafaxine XR (EFFEXOR-XR) 75 MG 24 hr capsule Take 150 mg by mouth daily with breakfast.     [provider]  zolpidem (AMBIEN) 10 MG tablet TAKE 1 TABLET (10 mg) BY MOUTH AT BEDTIME AS NEEDED for sleep 05/23/15   [provider]    Family History Family History  Problem Relation Age of Onset  . Arthritis Mother   . Heart disease Mother     ?psvt  . Breast cancer Mother 16    TAH/BSO  . Cancer Mother   . Mental illness Mother   . COPD Father   . Hypertension Father   . Alcohol abuse Father   . Mental illness Father   . Heart disease Father   . Healthy Daughter   .  Breast cancer Maternal Aunt 27    deceased  . Cancer Cousin 13    female; unknown primary  . Colon cancer Paternal Aunt 6    deceased at 34  . Stomach cancer Paternal Uncle 96    deceased at 26  . Alcohol abuse Brother   . Cancer Brother   . Hodgkin's lymphoma Brother   . Mental illness Brother   . HIV Brother   . Healthy Brother   . Healthy Son     Social History Social History  Substance Use Topics  . Smoking status: Former Smoker    Packs/day: 0.50    Years: 10.00    Types: Cigarettes    Quit date: 12/06/2005  . Smokeless tobacco: Never Used     Comment: smoked for 10 years  . Alcohol use No     Comment: denied alcohol     Allergies   Aspirin; Ibuprofen; Ativan [lorazepam]; Ketamine; Toradol [ketorolac tromethamine]; and Tramadol hcl   Review of Systems Review of Systems ROS reviewed and all are negative for acute change except as noted in the HPI.  Physical Exam Updated Vital Signs BP (!) 125/114 Comment: first reading,pt will not allow me to obtain a second reading  Pulse (!) 102   Temp 98.9 F (37.2 C) (Oral)   Resp 17   SpO2 99%   Physical Exam  Constitutional: She is oriented to person, place, and time. Vital signs are normal. She appears well-developed and well-nourished.  HENT:  Head: Normocephalic and atraumatic.  Right Ear: Hearing normal.  Left Ear: Hearing normal.  Eyes: Conjunctivae and EOM are normal. Pupils are equal, round, and reactive to light.  Neck: Normal range of motion. Neck supple.  Wound vac noted on posterior neck. No erythema around vac. Minimal purulence in wound vac machine. No red streaking. No swelling.   Cardiovascular: Normal rate, regular rhythm, normal heart sounds and intact distal pulses.   Pulmonary/Chest: Effort normal and breath sounds normal.  Abdominal: Soft.  Musculoskeletal: Normal range of motion.  Neurological: She is alert and oriented to person, place, and time.  Skin: Skin is warm and dry.  Psychiatric:  She has  a normal mood and affect. Her speech is normal and behavior is normal. Thought content normal.  Nursing note and vitals reviewed.    ED Treatments / Results  Labs (all labs ordered are listed, but only abnormal results are displayed) Labs Reviewed  CBC - Abnormal; Notable for the following:       Result Value   Hemoglobin 11.9 (*)    All other components within normal limits  BASIC METABOLIC PANEL - Abnormal; Notable for the following:    Potassium 3.2 (*)    Glucose, Bld 116 (*)    All other components within normal limits  AEROBIC/ANAEROBIC CULTURE (SURGICAL/DEEP WOUND)  I-STAT CG4 LACTIC ACID, ED    EKG  EKG Interpretation None       Radiology Xr Lumbar Spine 2-3 Views  Result Date: 08/01/2016 AP lateral lumbar spine x-rays demonstrate instrumented fusion from L4-S1. Cages are in satisfactory position. Impression: Status post L4-S1 fusion no evidence of loosening and cages are in satisfactory position.   Procedures Procedures (including critical care time)  Medications Ordered in ED Medications - No data to display   Initial Impression / Assessment and Plan / ED Course  I have reviewed the triage vital signs and the nursing notes.  Pertinent labs & imaging results that were available during my care of the patient were reviewed by me and considered in my medical decision making (see chart for details).  Final Clinical Impressions(s) / ED Diagnoses  {I have reviewed and evaluated the relevant laboratory values.   {I have reviewed the relevant previous healthcare records.  {I obtained HPI from historian.   ED Course:  Assessment: Pt is a 61 y.o. female presents to the Emergency Department today due to possible wound infection. Sent to ED by home health due to worsening infection on neck from C spine surgery. Currently being treated with IV ABX for wound with PICC line as well as Wound Vac. Home health noted increased drainage, swelling, and possible necrotic  areas. Seen by Surgeon yesterday (Dr. Dutch Gray) who noted necrotic areas and will likely debride in 2 weeks. On exam, pt in NAD. Nontoxic/nonseptic appearing. VSS. Afebrile. Lungs CTA. Affected area on C spine with wound vac in place. No surrounding erythema. No swelling. Minimal purulence noted in wound vac. CBC without leukocytosis. BMP unremarkable. Lactic negative.  Wound appears well healing. Discussed with attending physician. Wound culture obtained. Plan is to DC home with follow up to PCP. PT agreeable to DC. At time of discharge, Patient is in no acute distress. Vital Signs are stable. Patient is able to ambulate. Patient able to tolerate PO.   Disposition/Plan:  DC Home Additional Verbal discharge instructions given and discussed with patient.  Pt Instructed to f/u with PCP in the next week for evaluation and treatment of symptoms. Return precautions given Pt acknowledges and agrees with plan  Supervising Physician Long, Wonda Olds, MD  Final diagnoses:  Encounter for post surgical wound check    New Prescriptions New Prescriptions   No medications on file     Shary Decamp, Hershal Coria 08/02/16 2313    Long, Wonda Olds, MD 08/03/16 1109

## 2016-08-02 NOTE — ED Notes (Signed)
MD ar bedside

## 2016-08-02 NOTE — Discharge Instructions (Signed)
Please read and follow all provided instructions.  Your diagnoses today include:  1. Encounter for post surgical wound check     Tests performed today include: Vital signs. See below for your results today.   Medications prescribed:  Take as prescribed   Home care instructions:  Follow any educational materials contained in this packet.  Follow-up instructions: Please follow-up with your primary care provider for further evaluation of symptoms and treatment   Return instructions:  Please return to the Emergency Department if you do not get better, if you get worse, or new symptoms OR  - Fever (temperature greater than 101.30F)  - Bleeding that does not stop with holding pressure to the area    -Severe pain (please note that you may be more sore the day after your accident)  - Chest Pain  - Difficulty breathing  - Severe nausea or vomiting  - Inability to tolerate food and liquids  - Passing out  - Skin becoming red around your wounds  - Change in mental status (confusion or lethargy)  - New numbness or weakness    Please return if you have any other emergent concerns.  Additional Information:  Your vital signs today were: BP (!) 125/114 Comment: first reading,pt will not allow me to obtain a second reading   Pulse (!) 102    Temp 98.9 F (37.2 C) (Oral)    Resp 17    SpO2 99%  If your blood pressure (BP) was elevated above 135/85 this visit, please have this repeated by your doctor within one month. ---------------

## 2016-08-02 NOTE — ED Triage Notes (Signed)
Pt presents to ED with wound infection. The patient has a wound vac for an infection to a surgical wound in her neck. She is currently being treated with IV antibiotics for the wound. [facility] nurse sent pt to ED today because the wound is worse, with increased drainage, swelling, and possible necrotic areas.

## 2016-08-03 ENCOUNTER — Telehealth (INDEPENDENT_AMBULATORY_CARE_PROVIDER_SITE_OTHER): Payer: Self-pay | Admitting: Radiology

## 2016-08-03 ENCOUNTER — Other Ambulatory Visit: Payer: Self-pay | Admitting: Pharmacist

## 2016-08-03 ENCOUNTER — Telehealth: Payer: Self-pay | Admitting: Pharmacist

## 2016-08-03 ENCOUNTER — Telehealth: Payer: Self-pay | Admitting: *Deleted

## 2016-08-03 DIAGNOSIS — E876 Hypokalemia: Secondary | ICD-10-CM

## 2016-08-03 MED ORDER — POTASSIUM CHLORIDE CRYS ER 20 MEQ PO TBCR: 40.0000 meq | EXTENDED_RELEASE_TABLET | Freq: Once | ORAL | 0 refills | Status: DC

## 2016-08-03 NOTE — Telephone Encounter (Signed)
Excuse for class emailed.

## 2016-08-03 NOTE — Telephone Encounter (Signed)
I sent text message to Dr. Ophelia Charter advising as he is not in the office. He replied to message and will call Heather.

## 2016-08-03 NOTE — Telephone Encounter (Signed)
Lauren, RN with [facility] called stating that patient's wound is starting to decline. They sent the patient to the ED last night where wound was cultured. Those results are still pending. Her WBC were 15 and her potassium was off. The wound nurse is requesting different orders for treatment and would like to email pictures of the wound. Alfredo Martinez, RN Manager with [facility] wants to speak directly to Dr. Ophelia Charter regarding patient.  Heather- [phone]

## 2016-08-03 NOTE — Telephone Encounter (Signed)
Dr. Ophelia Charter called RN. He would like for patient to come into office on Tuesday to be seen. I called and left voicemail for patient advising we needed to make her an appt for Tuesday. I asked that she call back to let me know when she would be able to come.

## 2016-08-03 NOTE — Telephone Encounter (Signed)
Received a message on voice mail from [facility] at [facility] agency she states she took patients BP today and it was 120/80 . She is concerned because patient told her she is taking her husbands blood pressure medication instead of prescribed amlodipine because she states amlodipine wasn't working so she is self medicating with husbands prescription BP med. Tried to call patient to see what she has been taking.and got voice mail . Left message for patient to return call.

## 2016-08-03 NOTE — Telephone Encounter (Signed)
Patient returned call. She is being added to schedule Tuesday, May 15 at 1:15pm.  She also requested another letter in regards to being out of class 07/11/16-07/17/16 and 07/18/17-07/24/16 due to complications after surgery. She wants the same letter as before, she just needs it to state these dates in particular. She would like this emailed to [email or web address]

## 2016-08-03 NOTE — Telephone Encounter (Signed)
I noticed Kara [name]'s potassium was low from Monday's labs at 2.6.  She was seen in ED yesterday and it was repeated and found to still be a little low at 3.2.  Called the patient and she did not take any supplementation when seen in ED yesterday.  [facility] Patient aware and in agreement.

## 2016-08-04 ENCOUNTER — Encounter: Payer: Self-pay | Admitting: Family Medicine

## 2016-08-05 LAB — AEROBIC CULTURE  (SUPERFICIAL SPECIMEN): CULTURE: NO GROWTH

## 2016-08-05 LAB — AEROBIC CULTURE W GRAM STAIN (SUPERFICIAL SPECIMEN)

## 2016-08-07 ENCOUNTER — Encounter (INDEPENDENT_AMBULATORY_CARE_PROVIDER_SITE_OTHER): Payer: Self-pay | Admitting: Orthopaedic Surgery

## 2016-08-07 ENCOUNTER — Telehealth (INDEPENDENT_AMBULATORY_CARE_PROVIDER_SITE_OTHER): Payer: Self-pay | Admitting: Orthopaedic Surgery

## 2016-08-07 ENCOUNTER — Encounter: Payer: Self-pay | Admitting: Infectious Diseases

## 2016-08-07 ENCOUNTER — Ambulatory Visit (INDEPENDENT_AMBULATORY_CARE_PROVIDER_SITE_OTHER): Payer: Managed Care, Other (non HMO) | Admitting: Orthopaedic Surgery

## 2016-08-07 VITALS — BP 146/94 | HR 84 | Ht 64.0 in | Wt 155.0 lb

## 2016-08-07 DIAGNOSIS — L089 Local infection of the skin and subcutaneous tissue, unspecified: Secondary | ICD-10-CM

## 2016-08-07 DIAGNOSIS — T814XXD Infection following a procedure, subsequent encounter: Secondary | ICD-10-CM | POA: Diagnosis not present

## 2016-08-07 DIAGNOSIS — T8140XD Infection following a procedure, unspecified, subsequent encounter: Secondary | ICD-10-CM

## 2016-08-07 MED ORDER — HYDROCODONE-ACETAMINOPHEN 5-325 MG PO TABS: 1.0000 | ORAL_TABLET | Freq: Two times a day (BID) | ORAL | 0 refills | Status: DC | PRN

## 2016-08-07 NOTE — Telephone Encounter (Signed)
Patient called advised she is in a lot of pain and do not have anything to take to help control the pain. Patient asked if she can get something until her appointment tomorrow at 1:15 pm with Dr Ophelia Charter. Patient is very upset and crying. The number to contact patient is [phone]

## 2016-08-07 NOTE — Telephone Encounter (Signed)
Please advise 

## 2016-08-07 NOTE — Progress Notes (Signed)
Post-Op Visit Note   Patient: Kara [name]           Date of Birth: 1955-08-02           MRN: [id number] Visit Date: 08/07/2016 PCP: Natalia Leatherwood, DO   Assessment & Plan:  Chief Complaint:  Chief Complaint  Patient presents with  . Neck - Routine Post Op, Wound Check   Visit Diagnoses: Postoperative infection after posterior cervical fusion with VAC.  Plan:  Today is a separate procedure using sterile instruments, sterile scissors ,sterile pickups, the depths of her wound was debrided removing white necrotic tissue. No remains of the Stimulan beads that had tobramycin and vancomycin are left. Remaining tissue has red granulation tissue present. C7 spinous process is still palpable and has some tissue covering. There was no purulence in the wound. Sharp excisional debridement of less than 20 cm was performed followed by sterile wet-to-dry dressing, dry gauze and paper tape. She will go home and have the VAC placed. Long discussion about narcotics, previous withdrawal symptoms, her ongoing back pain symptoms, her normal treatment for depression was undertaken with the patient's husband present. Prescription for Norco 07/28/23/2010 tablets given. She been taking 1 a day and her husband states the last dose she had was yesterday she states it was 4 days ago. We discussed the plan for weaning of her narcotics. She been successful for this in the past for several months until she developed a late postoperative infection of her posterior cervical fusion requiring repeat surgery and back changes. I will recheck her in 2 weeks.  Follow-Up Instructions: Return in about 2 weeks (around 08/21/2016).   Orders:  No orders of the defined types were placed in this encounter.  No orders of the defined types were placed in this encounter.   Imaging: No results found.  PMFS History: Patient Active Problem List   Diagnosis Date Noted  . Essential hypertension 07/26/2016  . Acne 07/26/2016  . DNR  (do not resuscitate) discussion   . Palliative care by specialist   . Post-operative infection 07/06/2016  . SIRS (systemic inflammatory response syndrome) (HCC) 07/05/2016  . Wound infection 07/05/2016  . Cervical pseudoarthrosis (HCC) 06/21/2016  . Opioid dependence in remission (HCC) 06/14/2016  . Mitral valve prolapse 01/04/2016  . Skin infection 12/22/2015  . HNP (herniated nucleus pulposus), lumbar 12/10/2015  . Prediabetes 08/19/2015  . BMI 26.0-26.9,adult 08/18/2015  . Chronic pain 08/18/2015  . Vitamin D deficiency 08/18/2015  . Elevated BP 08/18/2015  . Chronic fatigue disorder 06/14/2015  . S/P cervical spinal fusion 03/15/2015  . Spinal surgery in prior 3 months 07/03/2014  . PTSD (post-traumatic stress disorder) 03/07/2014  . Severe recurrent major depression without psychotic features (HCC) 03/06/2014  . Suicide threat or attempt 03/06/2014  . Aggressive behavior   . Family history of malignant neoplasm of breast   . Rheumatoid arthritis (HCC) 12/13/2011  . Fibromyalgia 12/13/2011   Past Medical History:  Diagnosis Date  . ADD (attention deficit disorder)    on Adderal  . Aggressive behavior of adult   . Allergy   . Anxiety   . Arthritis   . Asthma    as a child  . Cellulitis of breast 11/2013   RIGHT BREAST  . 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 Chronic kidney disease    due to infection from stone- no regular visits- fine now  . Chronic pain    goes to [facility] for pain control  .  Cold sore   . 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 Depression    takes meds daily  . Family history of malignant neoplasm of breast   . Fibromyalgia   . Nephrolithiasis   . Osteoporosis   . Other specified rheumatoid arthritis, right shoulder (HCC) 08/01/2011  . Post-nasal drip    hx of  . Rheumatoid arthritis(714.0)    autoimmune arthritis; methotrexate once/week  . Urinary incontinence       Family History  Problem Relation Age of Onset  . Arthritis Mother   . Heart disease Mother        ?psvt  . Breast cancer Mother 29       TAH/BSO  . Cancer Mother   . Mental illness Mother   . COPD Father   . Hypertension Father   . Alcohol abuse Father   . Mental illness Father   . Heart disease Father   . Healthy Daughter   . Breast cancer Maternal Aunt 27       deceased  . Cancer Cousin 35       female; unknown primary  . Colon cancer Paternal Aunt 34       deceased at 19  . Stomach cancer Paternal Uncle 17       deceased at 9  . Alcohol abuse Brother   . Cancer Brother   . Hodgkin's lymphoma Brother   . Mental illness Brother   . HIV Brother   . Healthy Brother   . Healthy Son     Past Surgical History:  Procedure Laterality Date  . ANTERIOR CERVICAL DECOMP/DISCECTOMY FUSION N/A 03/15/2015   Procedure: Cervical five-six, Cerival six-seven, Anterior Cervical Discectomy and Fusion, Allograft and Plate;  Surgeon: Eldred Manges, MD;  Location: [facility] OR;  Service: Orthopedics;  Laterality: N/A;  . BACK SURGERY  2011,16   lower back fusion l4-5, s1  . BLADDER SURGERY  2009  . BREAST ENHANCEMENT SURGERY  2003  . breast implants removed    . BREAST SURGERY  10/2013   REMOVAL OF BREAST IMPLANTS  . CERVICAL WOUND DEBRIDEMENT N/A 07/07/2016   Procedure: IRRIGATION AND DEBRIDEMENT POSTERIOR NECK;  Surgeon: Eldred Manges, MD;  Location: [facility] OR;  Service: Orthopedics;  Laterality: N/A;  . INCISION AND DRAINAGE ABSCESS Right 01/16/2014   Procedure: INCISION AND DRAINAGE AND OF RIGHT BREAST ABCESS;  Surgeon: Glenna Fellows, MD;  Location: [facility] ORS;  Service: General;  Laterality: Right;  . JOINT REPLACEMENT    . LIPOSUCTION  2003   abdominal  . LUMBAR LAMINECTOMY/DECOMPRESSION MICRODISCECTOMY N/A 12/10/2015   Procedure: Right L3-4 Hemilaminectomy, Excision of herniated nucleus pulposus;  Surgeon: Eldred Manges, MD;  Location: [facility] OR;  Service: Orthopedics;  Laterality: N/A;  . MASS  EXCISION  11/03/2011   Procedure: MINOR EXCISION OF MASS;  Surgeon: Wyn Forster., MD;  Location: [facility];  Service: Orthopedics;  Laterality: Left;  debride IP joint, cyst excision left index  . POSTERIOR CERVICAL FUSION/FORAMINOTOMY N/A 06/21/2016   Procedure: POSTERIOR CERVICAL FUSION C5-C7 SPINOUS PROCESS WIRING;  Surgeon: Eldred Manges, MD;  Location: [facility] OR;  Service: Orthopedics;  Laterality: N/A;  . POSTERIOR FUSION CERVICAL SPINE  06/21/2016   C5 C7  . SHOULDER SURGERY  2012   right  . TONSILLECTOMY  1987  . TOTAL SHOULDER ARTHROPLASTY  08/01/2011   Procedure: TOTAL SHOULDER ARTHROPLASTY;  Surgeon: Eulas Post, MD;  Location: [facility]  OR;  Service: Orthopedics;  Laterality: Right;  Right total shoulder arthroplasty  . TUBAL LIGATION  1988   Social History   Occupational History  . Not on file.   Social History Main Topics  . Smoking status: Former Smoker    Packs/day: 0.50    Years: 10.00    Types: Cigarettes    Quit date: 12/06/2005  . Smokeless tobacco: Never Used     Comment: smoked for 10 years  . Alcohol use No     Comment: denied alcohol  . Drug use: No     Comment: denied any drug use with admission nurse  . Sexual activity: Yes    Birth control/ protection: Post-menopausal

## 2016-08-07 NOTE — Telephone Encounter (Signed)
noted 

## 2016-08-07 NOTE — Telephone Encounter (Signed)
I called the patient to come in this morning. We can debride the posterior neck incision and then she can get her back changed after lunch today. Patient is calling the home [facility] nurse now.

## 2016-08-08 ENCOUNTER — Ambulatory Visit (INDEPENDENT_AMBULATORY_CARE_PROVIDER_SITE_OTHER): Payer: Managed Care, Other (non HMO) | Admitting: Orthopaedic Surgery

## 2016-08-08 ENCOUNTER — Telehealth (INDEPENDENT_AMBULATORY_CARE_PROVIDER_SITE_OTHER): Payer: Self-pay | Admitting: Orthopaedic Surgery

## 2016-08-08 NOTE — Telephone Encounter (Signed)
Lauren from [facility] called saying that the patient is refusing to put the wound vac back on. CB # [id number]  Wound Care Nurse # [phone]

## 2016-08-08 NOTE — Telephone Encounter (Signed)
Please advise 

## 2016-08-08 NOTE — Telephone Encounter (Signed)
I called pt and told her she must have the VAC to get it healed and covered over bone etc.

## 2016-08-09 ENCOUNTER — Other Ambulatory Visit: Payer: Self-pay | Admitting: Pharmacist

## 2016-08-09 ENCOUNTER — Encounter: Payer: Self-pay | Admitting: *Deleted

## 2016-08-09 ENCOUNTER — Telehealth: Payer: Self-pay | Admitting: Pharmacist

## 2016-08-09 ENCOUNTER — Telehealth: Payer: Self-pay | Admitting: Family Medicine

## 2016-08-09 DIAGNOSIS — E876 Hypokalemia: Secondary | ICD-10-CM

## 2016-08-09 MED ORDER — POTASSIUM CHLORIDE CRYS ER 20 MEQ PO TBCR: 60.0000 meq | EXTENDED_RELEASE_TABLET | Freq: Once | ORAL | 0 refills | Status: DC

## 2016-08-09 MED ORDER — LISINOPRIL 10 MG PO TABS: 10.0000 mg | ORAL_TABLET | Freq: Every day | ORAL | 0 refills | Status: DC

## 2016-08-09 NOTE — Telephone Encounter (Signed)
Please advise patient using meds not prescribed to her is dangerous.  It also would not be wise to start such a moderate dose of a BP medication without follow up or professional documentation of the actual blood pressure (not reports of "not working").  - There are risks to taking too much BP medication or interaction with other medications. That is why we started low and was trying to taper up until controlled, which she never did with the  Amlodipine prescribed. - Please explain to her, she is asking me to prescribe medication, but not following instructions and taking medications not prescribed for her, which puts herself at risk and makes her management more difficult.  - I will start lisinopril 10 mg, she is to follow up next week at the scheduled appt. If needed will increase dose at that time. We will need to monitor her kidney function after start of this medication  when she comes in as well.  - advise her not to take amlodipine in addition to this medication and not to take more then is prescribed to her so that we can safely manage her BP. - BP Goal: 120-135/60-80

## 2016-08-09 NOTE — Telephone Encounter (Signed)
Kara [name]'s potassium is a tad low again at 3.2.  Called patient and she did take the 40 mEq x 1 last week.  Will send in 60 mEq x 1 this time to try and get levels closer to normal.  Patient aware and will pick up medication this afternoon.

## 2016-08-09 NOTE — Telephone Encounter (Signed)
Left message for patient to reviewinformation sent in My Chart or call back for instructions.

## 2016-08-09 NOTE — Telephone Encounter (Signed)
Patient states her current bp med is not working. She tried her husband's bp med & it worked. It is called Lisinopril 20MG . She took it once a day. She took her bp reading prior to dosage. Can she get an Rx sent [facility]?

## 2016-08-10 ENCOUNTER — Telehealth (INDEPENDENT_AMBULATORY_CARE_PROVIDER_SITE_OTHER): Payer: Self-pay | Admitting: Orthopaedic Surgery

## 2016-08-10 NOTE — Telephone Encounter (Signed)
Please advise 

## 2016-08-10 NOTE — Telephone Encounter (Signed)
Right on a prescription pad patient had posterior cervical surgery for fusion and developed an infection. She's had a suction fact that has to be changed every other day. She will not be able to complete her trip on 08/30/2016 due to home health care coming to her house for dressing changes. The attached carbon copy of the surgery note when she had the infection washout. Betsy to call with the fax number for her to send the information

## 2016-08-10 NOTE — Telephone Encounter (Signed)
Patient called advised she is leaving the country 08-30-16 and wanted to ask Dr Ophelia Charter if he think the wound will be closed by then. Patient said she is going to [place] 06/06-06/18. Patient said [name] will be with her. Patient said [name] will be doing the packing of the wound. Patient advised she will be on her own from 09/11/16-10/17/16 in [place] and [place]. Patient said the tickets are not refundable and would need a letter stating the date of her surgery and the second surgery in order to get her refund back. The number to contact patient is [phone]

## 2016-08-11 ENCOUNTER — Telehealth (INDEPENDENT_AMBULATORY_CARE_PROVIDER_SITE_OTHER): Payer: Self-pay | Admitting: Orthopaedic Surgery

## 2016-08-11 ENCOUNTER — Ambulatory Visit (INDEPENDENT_AMBULATORY_CARE_PROVIDER_SITE_OTHER): Payer: Managed Care, Other (non HMO) | Admitting: Orthopaedic Surgery

## 2016-08-11 ENCOUNTER — Telehealth: Payer: Self-pay | Admitting: *Deleted

## 2016-08-11 NOTE — Telephone Encounter (Signed)
Note and OP note faxed to number provided. Also emailed to patient.

## 2016-08-11 NOTE — Telephone Encounter (Signed)
Pt stated the fax number you needed is [phone]   And if you could send her a copy via email please  Thank you

## 2016-08-11 NOTE — Telephone Encounter (Signed)
I have note written and scanned, as well as OP note. I called patient and she did not have fax number available. She is going to return call with fax number for me to send information to.

## 2016-08-11 NOTE — Telephone Encounter (Signed)
Patient advised we received lab results drawn on 08/08/16.  Labs show CMP- Low potassium at 3.2         CBC WNL         CRP: 8.0 (H)         Sed rate 16  Patient advised of results and advised to follow up with PCP regarding low potassium. Patient states the infectious disease doctor is follow the low potassium and the elevated CRP. Patient states she has been taking a prescription for potassium.

## 2016-08-14 ENCOUNTER — Encounter: Payer: Self-pay | Admitting: Infectious Diseases

## 2016-08-15 ENCOUNTER — Encounter (HOSPITAL_COMMUNITY): Payer: Self-pay | Admitting: Orthopaedic Surgery

## 2016-08-15 ENCOUNTER — Telehealth (INDEPENDENT_AMBULATORY_CARE_PROVIDER_SITE_OTHER): Payer: Self-pay | Admitting: Orthopaedic Surgery

## 2016-08-15 NOTE — Telephone Encounter (Signed)
Call please

## 2016-08-15 NOTE — Telephone Encounter (Signed)
Repeated yesterday and was 3.9.

## 2016-08-15 NOTE — Telephone Encounter (Signed)
Thank you Let me know of her repeat level after the 60 thanks

## 2016-08-15 NOTE — Telephone Encounter (Signed)
thanks

## 2016-08-15 NOTE — Telephone Encounter (Signed)
Julieta Gutting with [facility] called needing verbal orders for 1 wk 1, 3 wk 5 and 2 PRNS for WD care. Lauren advised the WD vac stopped working and they are sending another one out tomorrow at 10:30 am. The number to contact [name] is [phone]

## 2016-08-15 NOTE — Telephone Encounter (Signed)
I called and gave verbal ok for orders to [name]

## 2016-08-15 NOTE — Telephone Encounter (Signed)
OK - thanks

## 2016-08-15 NOTE — Telephone Encounter (Signed)
Ok for orders? 

## 2016-08-16 ENCOUNTER — Encounter: Payer: Self-pay | Admitting: Family Medicine

## 2016-08-16 ENCOUNTER — Ambulatory Visit (INDEPENDENT_AMBULATORY_CARE_PROVIDER_SITE_OTHER): Payer: Managed Care, Other (non HMO) | Admitting: Family Medicine

## 2016-08-16 ENCOUNTER — Telehealth (INDEPENDENT_AMBULATORY_CARE_PROVIDER_SITE_OTHER): Payer: Self-pay | Admitting: Orthopaedic Surgery

## 2016-08-16 ENCOUNTER — Ambulatory Visit (INDEPENDENT_AMBULATORY_CARE_PROVIDER_SITE_OTHER): Payer: Managed Care, Other (non HMO) | Admitting: Orthopaedic Surgery

## 2016-08-16 VITALS — BP 156/96 | HR 80 | Temp 98.5°F | Resp 20 | Ht 64.0 in | Wt 161.0 lb

## 2016-08-16 DIAGNOSIS — I10 Essential (primary) hypertension: Secondary | ICD-10-CM | POA: Diagnosis not present

## 2016-08-16 DIAGNOSIS — Z8619 Personal history of other infectious and parasitic diseases: Secondary | ICD-10-CM

## 2016-08-16 MED ORDER — LISINOPRIL 10 MG PO TABS: 10.0000 mg | ORAL_TABLET | Freq: Every day | ORAL | 1 refills | Status: DC

## 2016-08-16 MED ORDER — VALACYCLOVIR HCL 1 G PO TABS: ORAL_TABLET | ORAL | 2 refills | Status: DC

## 2016-08-16 NOTE — Telephone Encounter (Signed)
Please advise 

## 2016-08-16 NOTE — Telephone Encounter (Signed)
Ok to continue ABX for 2 more weeks thanks.

## 2016-08-16 NOTE — Progress Notes (Signed)
Kara [name] , 20-Aug-1955, 61 y.o., female MRN: [id number] Patient Care Team    Relationship Specialty Notifications Start End  Natalia Leatherwood, DO PCP - General Family Medicine  06/14/15   Eldred Manges, MD Consulting Physician Orthopedic Surgery  06/14/15   Milagros Evener, MD Consulting Physician Psychiatry  06/14/15     Chief Complaint  Patient presents with  . Hypertension    not taking medications     Subjective:  Hypertension:  Pt reports she has decreased her Effexor and feels her BP improved. She is off all pain meds and abx. Her wound vac will need replaced per her report. She has been difficult to manage with her BP, secondary to not take medication prescribed, taking her husbands medication and then stopping second medication prescribe before appt to followup. Pt originally started on amlodipine with tapering instructions as to not to start to high of dose as she was tapering back on effexor, which was likely driving up her BP at the dose she was taking (more than prescribed). She did not do that appropriately, and started taking her husbands medication which was lisinopril 20 mg daily. She advised to discontinue using that medication, and was prescribed lisinopril 10 mg daily, and told to not take the amlodipine. She reports she was compliant with lisinopril 10 mg until 3 days ago. She fell her blood pressure was controlled so she stopped taking the medication. While taking the lisinopril 10 mg daily her blood pressure was approximately 125/70 - 140/78, by her report.  Prior note:  Pt presents for an OV with complaints of elevated BP. Patient has many recent ED visits following cervical fusion of her neck. She currently has a wound vac in place for post-op infection. She is prescribed a narcotic for her pain by her surgeon. She reports fairly controlled pain with the use of regimen. She has had elevated BP readings recorded high of 180s systolic and 118 diastolic. She reports having a  headache at home and taking her BP with a systolic reading above 200. She endorses headaches and "tunnel vision" feeling intermittently over hte last 2 weeks with elevated BP. She thought it might have been secondary to her pain level, however Bp remains elevated and pain is better controlled. She does report taking Adderall XR 60 mg in the morning and Adderall 30mg  (short acting) in the evening as well as Effexor 225 mg daily. She states she increased her effexor from 150 to 225 about 2 months. Pt has had intermittent borderline BP readings documented in EMR over many visits, with elevated BP readings mostly occurring over the 5 weeks. Her cervical fusion occurred 06/21/2016 with repeat surgery for infection 07/07/2016.   Depression screen PHQ 2/9 08/18/2015  Decreased Interest 0  Down, Depressed, Hopeless 0  PHQ - 2 Score 0  Some recent data might be hidden    Allergies  Allergen Reactions  . Aspirin Anaphylaxis  . Ibuprofen Anaphylaxis  . Ativan [Lorazepam] Other (See Comments)    Makes agitated, combative   . Ketamine Other (See Comments)    Hallucinations  . Toradol [Ketorolac Tromethamine] Itching, Swelling and Rash    UNSPECIFIED REACTION   . Tramadol Hcl Itching, Swelling and Rash    SWELLING REACTION UNSPECIFIED  Tolerates Dilaudid 06/2016.  TDD.   Social History  Substance Use Topics  . Smoking status: Former Smoker    Packs/day: 0.50    Years: 10.00    Types: Cigarettes    Quit date:  12/06/2005  . Smokeless tobacco: Never Used     Comment: smoked for 10 years  . Alcohol use No     Comment: denied alcohol   Past Medical History:  Diagnosis Date  . ADD (attention deficit disorder)    on Adderal  . Aggressive behavior of adult   . Allergy   . Anxiety   . Arthritis   . Asthma    as a child  . Cellulitis of breast 11/2013   RIGHT BREAST  . 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 Chronic kidney disease    due to infection from stone- no regular  visits- fine now  . Chronic pain    goes to [facility] for pain control  . Cold sore   . 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 Depression    takes meds daily  . Family history of malignant neoplasm of breast   . Fibromyalgia   . Nephrolithiasis   . Osteoporosis   . Other specified rheumatoid arthritis, right shoulder (HCC) 08/01/2011  . Post-nasal drip    hx of  . Rheumatoid arthritis(714.0)    autoimmune arthritis; methotrexate once/week  . Urinary incontinence    Past Surgical History:  Procedure Laterality Date  . ANTERIOR CERVICAL DECOMP/DISCECTOMY FUSION N/A 03/15/2015   Procedure: Cervical five-six, Cerival six-seven, Anterior Cervical Discectomy and Fusion, Allograft and Plate;  Surgeon: Eldred Manges, MD;  Location: [facility] OR;  Service: Orthopedics;  Laterality: N/A;  . BACK SURGERY  2011,16   lower back fusion l4-5, s1  . BLADDER SURGERY  2009  . BREAST ENHANCEMENT SURGERY  2003  . breast implants removed    . BREAST SURGERY  10/2013   REMOVAL OF BREAST IMPLANTS  . CERVICAL WOUND DEBRIDEMENT N/A 07/07/2016   Procedure: IRRIGATION AND DEBRIDEMENT POSTERIOR NECK;  Surgeon: Eldred Manges, MD;  Location: [facility] OR;  Service: Orthopedics;  Laterality: N/A;  . INCISION AND DRAINAGE ABSCESS Right 01/16/2014   Procedure: INCISION AND DRAINAGE AND OF RIGHT BREAST ABCESS;  Surgeon: Glenna Fellows, MD;  Location: [facility] ORS;  Service: General;  Laterality: Right;  . JOINT REPLACEMENT    . LIPOSUCTION  2003   abdominal  . LUMBAR LAMINECTOMY/DECOMPRESSION MICRODISCECTOMY N/A 12/10/2015   Procedure: Right L3-4 Hemilaminectomy, Excision of herniated nucleus pulposus;  Surgeon: Eldred Manges, MD;  Location: [facility] OR;  Service: Orthopedics;  Laterality: N/A;  . MASS EXCISION  11/03/2011   Procedure: MINOR EXCISION OF MASS;  Surgeon: Wyn Forster., MD;  Location: [facility];  Service: Orthopedics;  Laterality: Left;  debride IP joint,  cyst excision left index  . POSTERIOR CERVICAL FUSION/FORAMINOTOMY N/A 06/21/2016   Procedure: POSTERIOR CERVICAL FUSION C5-C7 SPINOUS PROCESS WIRING;  Surgeon: Eldred Manges, MD;  Location: [facility] OR;  Service: Orthopedics;  Laterality: N/A;  . POSTERIOR FUSION CERVICAL SPINE  06/21/2016   C5 C7  . SHOULDER SURGERY  2012   right  . TONSILLECTOMY  1987  . TOTAL SHOULDER ARTHROPLASTY  08/01/2011   Procedure: TOTAL SHOULDER ARTHROPLASTY;  Surgeon: Eulas Post, MD;  Location: [facility] OR;  Service: Orthopedics;  Laterality: Right;  Right total shoulder arthroplasty  . TUBAL LIGATION  1988   Family History  Problem Relation Age of Onset  . Arthritis Mother   . Heart disease Mother        ?psvt  . Breast cancer Mother 73  TAH/BSO  . Cancer Mother   . Mental illness Mother   . COPD Father   . Hypertension Father   . Alcohol abuse Father   . Mental illness Father   . Heart disease Father   . Healthy Daughter   . Breast cancer Maternal Aunt 27       deceased  . Cancer Cousin 62       female; unknown primary  . Colon cancer Paternal Aunt 24       deceased at 35  . Stomach cancer Paternal Uncle 86       deceased at 3  . Alcohol abuse Brother   . Cancer Brother   . Hodgkin's lymphoma Brother   . Mental illness Brother   . HIV Brother   . Healthy Brother   . Healthy Son    Allergies as of 08/16/2016      Reactions   Aspirin Anaphylaxis   Ibuprofen Anaphylaxis   Ativan [lorazepam] Other (See Comments)   Makes agitated, combative   Ketamine Other (See Comments)   Hallucinations   Toradol [ketorolac Tromethamine] Itching, Swelling, Rash   UNSPECIFIED REACTION    Tramadol Hcl Itching, Swelling, Rash   SWELLING REACTION UNSPECIFIED  Tolerates Dilaudid 06/2016.  TDD.      Medication List       Accurate as of 08/16/16  1:51 PM. Always use your most recent med list.          ADDERALL XR 30 MG 24 hr capsule Generic drug:  amphetamine-dextroamphetamine Take 60 mg by mouth  daily. Takes 2 capsules in the morning   amphetamine-dextroamphetamine 30 MG tablet Commonly known as:  ADDERALL Take 30 mg by mouth daily. Before 4 pm   ALPRAZolam 1 MG tablet Commonly known as:  XANAX Take 1 mg by mouth 3 (three) times daily as needed for anxiety.   cyclobenzaprine 10 MG tablet Commonly known as:  FLEXERIL TAKE 1 TABLET (10 MG TOTAL) BY MOUTH 3 (THREE) TIMES DAILY AS NEEDED FOR MUSCLE SPASMS.   diclofenac 75 MG EC tablet Commonly known as:  VOLTAREN Take 1 tablet (75 mg total) by mouth 2 (two) times daily.   Diclofenac Sodium 2 % Soln Commonly known as:  PENNSAID Place 2 g onto the skin 4 (four) times daily as needed.   FLUoxetine 10 MG capsule Commonly known as:  PROZAC   lisinopril 10 MG tablet Commonly known as:  PRINIVIL,ZESTRIL Take 1 tablet (10 mg total) by mouth daily.   meloxicam 15 MG tablet Commonly known as:  MOBIC   methocarbamol 500 MG tablet Commonly known as:  ROBAXIN Take 1 tablet (500 mg total) by mouth 4 (four) times daily.   potassium chloride SA 20 MEQ tablet Commonly known as:  K-DUR,KLOR-CON Take 3 tablets (60 mEq total) by mouth once.   rifampin 300 MG capsule Commonly known as:  RIFADIN Take 1 capsule (300 mg total) by mouth daily.   tretinoin 0.1 % cream Commonly known as:  RETIN-A Apply topically at bedtime.   venlafaxine XR 75 MG 24 hr capsule Commonly known as:  EFFEXOR-XR Take 75 mg by mouth daily with breakfast. 37.5 mg for total of 112.5mg    zolpidem 10 MG tablet Commonly known as:  AMBIEN TAKE 1 TABLET (10 mg) BY MOUTH AT BEDTIME AS NEEDED for sleep       All past medical history, surgical history, allergies, family history, immunizations andmedications were updated in the EMR today and reviewed under the history and medication portions  of their EMR.     ROS: Negative, with the exception of above mentioned in HPI   Objective:  BP (!) 156/96 (BP Location: Left Arm, Patient Position: Sitting, Cuff Size:  Normal)   Pulse 80   Temp 98.5 F (36.9 C)   Resp 20   Ht 5\' 4"  (1.626 m)   Wt 161 lb (73 kg)   SpO2 100%   BMI 27.64 kg/m  Body mass index is 27.64 kg/m. Gen: Afebrile. No acute distress.  HENT: AT. [place]. MMM.  CV: RRR No murmur, no edema, +2/4 P posterior tibialis pulses Chest: CTAB, no wheeze or crackles Abd: Soft. NTND. BS present.  Neuro:  Normal gait. PERLA. EOMi. Alert. Oriented x3   No exam data present No results found. No results found for this or any previous visit (from the past 24 hour(s)).  Assessment/Plan: Kara [name] is a 61 y.o. female present for OV for  Essential hypertension - Restart lisinopril 10 mg daily. By her reports of blood pressures, her blood pressure was controlled with this medication. Unfortunately she did not take it today to verify in the office. - Patient educated that her blood pressure was borderline prior to recent changes with pain and her Effexor. Therefore although her Effexor is tapered back down to her normal dose, she still needs the blood pressure medicine. Furthermore her blood pressure is elevated today, and she has not taken her medication in 3 days. - discussed emergent signs and symptoms of hypertensive crisis and pt educated on reporting to ED immediately if experiences.  - F/U 3 months  History of cold sores:  -Refilled on Valtrex for her today, per her request.  Of note patient also requested Flexeril and meloxicam to be refilled, these medications are not prescribed by this provider. She was encouraged to call the prescribing doctor for those medication refills or her pharmacy.   Reviewed expectations re: course of current medical issues.  Discussed self-management of symptoms.  Outlined signs and symptoms indicating need for more acute intervention.  Patient verbalized understanding and all questions were answered.  Patient received an After-Visit Summary.   Note is dictated utilizing voice recognition software.  Although note has been proof read prior to signing, occasional typographical errors still can be missed. If any questions arise, please do not hesitate to call for verification.   electronically signed by:  46, DO  [facility] - OR

## 2016-08-16 NOTE — Telephone Encounter (Signed)
Lauren from [facility] called letting us know that the patient is on the verge of running out of her antibiotics in the next few days and she was wanting to know if she needs an order to pull the pick line or leave it in for a little bit longer. CB # [phone]

## 2016-08-16 NOTE — Telephone Encounter (Signed)
Patient states she will need a Rx refill on her antibiotics if she is to continue taking them. Please advise and call patient

## 2016-08-16 NOTE — Telephone Encounter (Signed)
Also received message from Lauren with [facility] wanting to know if PICC needed to be pulled, etc when antibiotics ran out. I advised Lauren continue antibiotics x 2 weeks per other message. Lauren is going to call the patient.

## 2016-08-16 NOTE — Telephone Encounter (Signed)
Per another message from Dr. Ophelia Charter, continue antibiotics x 2 weeks. Lauren advised. She is going to call patient.

## 2016-08-16 NOTE — Patient Instructions (Addendum)
Continue lisinopril 10 mg daily.  Follow up in 3 months.   I have refilled both the valtrex and lisinopril for you today. HAVE FUN ON YOUR TRIP!!!  Please help [place] help you:  We are honored you have chosen [facility] for your Primary Care home. Below you will find basic instructions that you may need to access in the future. Please help [place] help you by reading the instructions, which cover many of the frequent questions we experience.   Prescription refills and request:  -In order to allow more efficient response time, please call your pharmacy for all refills. They will forward the request electronically to [place]. This allows for the quickest possible response. Request left on a nurse line can take longer to refill, since these are checked as time allows between office patients and other phone calls.  - refill request can take up to 3-5 working days to complete.  - If request is sent electronically and request is appropiate, it is usually completed in 1-2 business days.  - all patients will need to be seen routinely for all chronic medical conditions requiring prescription medications (see follow-up below). If you are overdue for follow up on your condition, you will be asked to make an appointment and we will call in enough medication to cover you until your appointment (up to 30 days).  - all controlled substances will require a face to face visit to request/refill.  - if you desire your prescriptions to go through a new pharmacy, and have an active script at original pharmacy, you will need to call your pharmacy and have scripts transferred to new pharmacy. This is completed between the pharmacy locations and not by your provider.    Results: If any images or labs were ordered, it can take up to 1 week to get results depending on the test ordered and the lab/facility running and resulting the test. - Normal or stable results, which do not need further discussion, may be released to your  mychart immediately with attached note to you. A call may not be generated for normal results. Please make certain to sign up for mychart. If you have questions on how to activate your mychart you can call the front office.  - If your results need further discussion, our office will attempt to contact you via phone, and if unable to reach you after 2 attempts, we will release your abnormal result to your mychart with instructions.  - All results will be automatically released in mychart after 1 week.  - Your provider will provide you with explanation and instruction on all relevant material in your results. Please keep in mind, results and labs may appear confusing or abnormal to the untrained eye, but it does not mean they are actually abnormal for you personally. If you have any questions about your results that are not covered, or you desire more detailed explanation than what was provided, you should make an appointment with your provider to do so.   Our office handles many outgoing and incoming calls daily. If we have not contacted you within 1 week about your results, please check your mychart to see if there is a message first and if not, then contact our office.  In helping with this matter, you help decrease call volume, and therefore allow [place] to be able to respond to patients needs more efficiently.   Acute office visits (sick visit):  An acute visit is intended for a new problem and are  scheduled in shorter time slots to allow schedule openings for patients with new problems. This is the appropriate visit to discuss a new problem. In order to provide you with excellent quality medical care with proper time for you to explain your problem, have an exam and receive treatment with instructions, these appointments should be limited to one new problem per visit. If you experience a new problem, in which you desire to be addressed, please make an acute office visit, we save openings on the schedule to  accommodate you. Please do not save your new problem for any other type of visit, let us take care of it properly and quickly for you.   Follow up visits:  Depending on your condition(s) your provider will need to see you routinely in order to provide you with quality care and prescribe medication(s). Most chronic conditions (Example: hypertension, Diabetes, depression/anxiety... etc), require visits a couple times a year. Your provider will instruct you on proper follow up for your personal medical conditions and history. Please make certain to make follow up appointments for your condition as instructed. Failing to do so could result in lapse in your medication treatment/refills. If you request a refill, and are overdue to be seen on a condition, we will always provide you with a 30 day script (once) to allow you time to schedule.    Medicare wellness (well visit): - we have a wonderful Nurse Selena Batten), that will meet with you and provide you will yearly medicare wellness visits. These visits should occur yearly (can not be scheduled less than 1 calendar year apart) and cover preventive health, immunizations, advance directives and screenings you are entitled to yearly through your medicare benefits. Do not miss out on your entitled benefits, this is when medicare will pay for these benefits to be ordered for you.  These are strongly encouraged by your provider and is the appropriate type of visit to make certain you are up to date with all preventive health benefits. If you have not had your medicare wellness exam in the last 12 months, please make certain to schedule one by calling the office and schedule your medicare wellness with Selena Batten as soon as possible.   Yearly physical (well visit):  - Adults are recommended to be seen yearly for physicals. Check with your insurance and date of your last physical, most insurances require one calendar year between physicals. Physicals include all preventive health  topics, screenings, medical exam and labs that are appropriate for gender/age and history. You may have fasting labs needed at this visit. This is a well visit (not a sick visit), new problems should not be covered during this visit (see acute visit).  - Pediatric patients are seen more frequently when they are younger. Your provider will advise you on well child visit timing that is appropriate for your their age. - This is not a medicare wellness visit. Medicare wellness exams do not have an exam portion to the visit. Some medicare companies allow for a physical, some do not allow a yearly physical. If your medicare allows a yearly physical you can schedule the medicare wellness with our nurse Selena Batten and have your physical with your provider after, on the same day. Please check with insurance for your full benefits.   Late Policy/No Shows:  - all new patients should arrive 15-30 minutes earlier than appointment to allow [place] time  to  obtain all personal demographics,  insurance information and for you to complete office paperwork. - All  established patients should arrive 10-15 minutes earlier than appointment time to update all information and be checked in .  - In our best efforts to run on time, if you are late for your appointment you will be asked to either reschedule or if able, we will work you back into the schedule. There will be a wait time to work you back in the schedule,  depending on availability.  - If you are unable to make it to your appointment as scheduled, please call 24 hours ahead of time to allow [place] to fill the time slot with someone else who needs to be seen. If you do not cancel your appointment ahead of time, you may be charged a no show fee.

## 2016-08-17 ENCOUNTER — Telehealth (INDEPENDENT_AMBULATORY_CARE_PROVIDER_SITE_OTHER): Payer: Self-pay

## 2016-08-17 NOTE — Telephone Encounter (Signed)
Patient would like to know if she can reschedule her appointment due to having another appointment on the same day.  Appointment is scheduled for 08/22/16 at 1:15.  Dr. Ophelia Charter doesn't have anything and she is leaving on 09/07/16.  Please Advise.  CB# is [phone].  Thank You.

## 2016-08-17 NOTE — Telephone Encounter (Signed)
DONE APPT 08/25/16 @ 2:00PM

## 2016-08-17 NOTE — Telephone Encounter (Signed)
Can you please call patient and offer Friday afternoon on 6/1?  If she is unable to do that, you can put her in the Same Day appt on Wednesday. She has to come in for wound check.  Thanks.

## 2016-08-20 ENCOUNTER — Encounter (INDEPENDENT_AMBULATORY_CARE_PROVIDER_SITE_OTHER): Payer: Self-pay | Admitting: Orthopaedic Surgery

## 2016-08-20 ENCOUNTER — Other Ambulatory Visit (INDEPENDENT_AMBULATORY_CARE_PROVIDER_SITE_OTHER): Payer: Self-pay | Admitting: Orthopaedic Surgery

## 2016-08-22 ENCOUNTER — Other Ambulatory Visit: Payer: Self-pay | Admitting: *Deleted

## 2016-08-22 ENCOUNTER — Ambulatory Visit (INDEPENDENT_AMBULATORY_CARE_PROVIDER_SITE_OTHER): Payer: Managed Care, Other (non HMO) | Admitting: Orthopaedic Surgery

## 2016-08-22 ENCOUNTER — Ambulatory Visit (INDEPENDENT_AMBULATORY_CARE_PROVIDER_SITE_OTHER): Payer: Managed Care, Other (non HMO) | Admitting: Infectious Diseases

## 2016-08-22 ENCOUNTER — Telehealth (INDEPENDENT_AMBULATORY_CARE_PROVIDER_SITE_OTHER): Payer: Self-pay | Admitting: Orthopaedic Surgery

## 2016-08-22 ENCOUNTER — Encounter: Payer: Self-pay | Admitting: Infectious Diseases

## 2016-08-22 DIAGNOSIS — Z803 Family history of malignant neoplasm of breast: Secondary | ICD-10-CM

## 2016-08-22 DIAGNOSIS — L089 Local infection of the skin and subcutaneous tissue, unspecified: Secondary | ICD-10-CM

## 2016-08-22 DIAGNOSIS — T148XXA Other injury of unspecified body region, initial encounter: Principal | ICD-10-CM

## 2016-08-22 DIAGNOSIS — F1121 Opioid dependence, in remission: Secondary | ICD-10-CM | POA: Diagnosis not present

## 2016-08-22 MED ORDER — RIFAMPIN 300 MG PO CAPS: 300.0000 mg | ORAL_CAPSULE | Freq: Every day | ORAL | 1 refills | Status: DC

## 2016-08-22 MED ORDER — CEPHALEXIN 500 MG PO CAPS: 1000.0000 mg | ORAL_CAPSULE | Freq: Two times a day (BID) | ORAL | 3 refills | Status: DC

## 2016-08-22 NOTE — Assessment & Plan Note (Signed)
Negative mammo 2017.

## 2016-08-22 NOTE — Telephone Encounter (Signed)
Patient was returning your phone call. CB # [phone]

## 2016-08-22 NOTE — Assessment & Plan Note (Signed)
She has concerns about her continued, chronic pain.

## 2016-08-22 NOTE — Progress Notes (Signed)
   Subjective:    Patient ID: Kara [name], female    DOB: Jul 09, 1955, 62 y.o.   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 She developed drainage from her wound and low grade temps on 4-11.  She came to ED 4-11 and was found to have temp 101.2, WBC 20. She was started on vanco/zosyn and was taken to OR on 4-13. Purulnece was found, Cx were sent and vanco/tobra beads were placed.  A vac was placed.   Post-operatively she was agitated and combative. She was dx with delirium (which has improved).  Her Cx grew MSSA.  She was d/c home on 4-16 with ancef/rifampin.   She still has VAC. Fluid in vac compartment has changed from pink to green.  Still on IV anbx.  No problems with PIC line.  Quit rifampin 1.5 weeks ago. Going to [place] on 09-06-16.    4-11 4-23 4-30 5-8 5-14 5-21 ESR 31 41 33 '22 16 28 '$ CRP 15.4 X 2.8 39.1 8 13.5    Review of Systems  Constitutional: Negative for chills and fever.  Respiratory: Negative for cough and shortness of breath.   Gastrointestinal: Negative for constipation and diarrhea.  Genitourinary: Negative for difficulty urinating.  Musculoskeletal: Positive for neck pain.       Objective:   Physical Exam  Constitutional: She appears well-developed and well-nourished.  HENT:  Mouth/Throat: No oropharyngeal exudate.  Eyes: EOM are normal. Pupils are equal, round, and reactive to light.  Neck:    Cardiovascular: Normal rate, regular rhythm and normal heart sounds.   Pulmonary/Chest: Effort normal and breath sounds normal.  Abdominal: Soft. Bowel sounds are normal. There is no tenderness. There is no rebound.  Musculoskeletal: She exhibits no edema.  RUE PIC is clean, non-tender. No erythema.        Assessment & Plan:

## 2016-08-22 NOTE — Telephone Encounter (Signed)
fyi

## 2016-08-22 NOTE — Assessment & Plan Note (Signed)
Will remove her PIC and stop her cefazolin Will change her to keflex. She wishes to try this prior to leaving on her trip.  Will also resume her rifampin.  Will see her back after she returns from her trip.  I am not sure that her CRP and ESR are reliable.

## 2016-08-22 NOTE — Progress Notes (Signed)
Per Dr Ninetta Lights 37cm  Single lumen Peripherally Inserted Central Catheter  removed from right basilic . No sutures present. Dressing was clean and dry . Area cleansed with chlorhexidine and petroleum dressing applied. Pt advised no heavy lifting with this arm, leave dressing for 24 hours and call the office if dressing becomes soaked with blood or sharp pain presents.  Pt tolerated procedure well.    Labs drawn prior to PICC removal.   Laurell Josephs, RN

## 2016-08-22 NOTE — Telephone Encounter (Signed)
I called discussed.   She will come in Friday as scheduled

## 2016-08-23 LAB — CBC WITH DIFFERENTIAL/PLATELET
BASOS PCT: 1 %
Basophils Absolute: 74 cells/uL (ref 0–200)
Eosinophils Absolute: 222 cells/uL (ref 15–500)
Eosinophils Relative: 3 %
HEMATOCRIT: 36.9 % (ref 35.0–45.0)
Hemoglobin: 12 g/dL (ref 11.7–15.5)
LYMPHS ABS: 2220 {cells}/uL (ref 850–3900)
LYMPHS PCT: 30 %
MCH: 27.6 pg (ref 27.0–33.0)
MCHC: 32.5 g/dL (ref 32.0–36.0)
MCV: 85 fL (ref 80.0–100.0)
MONO ABS: 518 {cells}/uL (ref 200–950)
MPV: 8.7 fL (ref 7.5–12.5)
Monocytes Relative: 7 %
Neutro Abs: 4366 cells/uL (ref 1500–7800)
Neutrophils Relative %: 59 %
Platelets: 398 10*3/uL (ref 140–400)
RBC: 4.34 MIL/uL (ref 3.80–5.10)
RDW: 13 % (ref 11.0–15.0)
WBC: 7.4 10*3/uL (ref 3.8–10.8)

## 2016-08-23 LAB — COMPREHENSIVE METABOLIC PANEL
ALBUMIN: 4.1 g/dL (ref 3.6–5.1)
ALT: 4 U/L — ABNORMAL LOW (ref 6–29)
AST: 15 U/L (ref 10–35)
Alkaline Phosphatase: 107 U/L (ref 33–130)
BUN: 15 mg/dL (ref 7–25)
CALCIUM: 9.8 mg/dL (ref 8.6–10.4)
CHLORIDE: 104 mmol/L (ref 98–110)
CO2: 24 mmol/L (ref 20–31)
CREATININE: 0.6 mg/dL (ref 0.50–1.05)
Glucose, Bld: 83 mg/dL (ref 65–99)
POTASSIUM: 4.4 mmol/L (ref 3.5–5.3)
Sodium: 139 mmol/L (ref 135–146)
TOTAL PROTEIN: 7 g/dL (ref 6.1–8.1)
Total Bilirubin: 0.2 mg/dL (ref 0.2–1.2)

## 2016-08-23 MED ORDER — CYCLOBENZAPRINE HCL 10 MG PO TABS: 10.0000 mg | ORAL_TABLET | Freq: Three times a day (TID) | ORAL | 0 refills | Status: DC | PRN

## 2016-08-24 ENCOUNTER — Encounter (INDEPENDENT_AMBULATORY_CARE_PROVIDER_SITE_OTHER): Payer: Self-pay | Admitting: Orthopaedic Surgery

## 2016-08-25 ENCOUNTER — Ambulatory Visit (INDEPENDENT_AMBULATORY_CARE_PROVIDER_SITE_OTHER): Payer: Self-pay | Admitting: Orthopaedic Surgery

## 2016-08-25 ENCOUNTER — Encounter (INDEPENDENT_AMBULATORY_CARE_PROVIDER_SITE_OTHER): Payer: Self-pay | Admitting: Orthopaedic Surgery

## 2016-08-25 VITALS — BP 133/84 | HR 106 | Ht 64.0 in | Wt 155.0 lb

## 2016-08-25 DIAGNOSIS — T8140XD Infection following a procedure, unspecified, subsequent encounter: Secondary | ICD-10-CM

## 2016-08-25 DIAGNOSIS — T814XXD Infection following a procedure, subsequent encounter: Secondary | ICD-10-CM

## 2016-08-25 NOTE — Progress Notes (Signed)
Post-Op Visit Note   Patient: Kara [name]           Date of Birth: Dec 07, 1955           MRN: [id number] Visit Date: 08/25/2016 PCP: Natalia Leatherwood, DO   Assessment & Plan: Tinea VAC until she leaves on her trip on June 13. She will need to take oral antibiotics the rifampin and also the Keflex until she comes back which will be at the end of July. She will be doing dressing changes wet-to-dry.  Chief Complaint:  Chief Complaint  Patient presents with  . Neck - Routine Post Op, Wound Check   Visit Diagnoses:  1. Postoperative infection, subsequent encounter     Plan: She'll return to see me when she gets back in town in late July. She will check and call us a few days before she is ready to leave for [place] to make sure she has ample supply of antibiotics that will last her her entire trip until she returns.  Follow-Up Instructions: No Follow-up on file.   Orders:  No orders of the defined types were placed in this encounter.  No orders of the defined types were placed in this encounter.   Imaging: No results found.  PMFS History: Patient Active Problem List   Diagnosis Date Noted  . Essential hypertension 07/26/2016  . Acne 07/26/2016  . DNR (do not resuscitate) discussion   . Palliative care by specialist   . Post-operative infection 07/06/2016  . SIRS (systemic inflammatory response syndrome) (HCC) 07/05/2016  . Wound infection 07/05/2016  . Cervical pseudoarthrosis (HCC) 06/21/2016  . Opioid dependence in remission (HCC) 06/14/2016  . Mitral valve prolapse 01/04/2016  . Skin infection 12/22/2015  . HNP (herniated nucleus pulposus), lumbar 12/10/2015  . Prediabetes 08/19/2015  . BMI 26.0-26.9,adult 08/18/2015  . Chronic pain 08/18/2015  . Vitamin D deficiency 08/18/2015  . Chronic fatigue disorder 06/14/2015  . S/P cervical spinal fusion 03/15/2015  . Spinal surgery in prior 3 months 07/03/2014  . PTSD (post-traumatic stress disorder) 03/07/2014  . Severe  recurrent major depression without psychotic features (HCC) 03/06/2014  . Suicide threat or attempt 03/06/2014  . Aggressive behavior   . Family history of malignant neoplasm of breast   . Rheumatoid arthritis (HCC) 12/13/2011  . Fibromyalgia 12/13/2011  . H/O cold sores 12/13/2011   Past Medical History:  Diagnosis Date  . ADD (attention deficit disorder)    on Adderal  . Aggressive behavior of adult   . Allergy   . Anxiety   . Arthritis   . Asthma    as a child  . Cellulitis of breast 11/2013   RIGHT BREAST  . 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 Chronic kidney disease    due to infection from stone- no regular visits- fine now  . Chronic pain    goes to [facility] control  . Cold sore   . 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 Depression    takes meds daily  . Family history of malignant neoplasm of breast   . Fibromyalgia   . Nephrolithiasis   . Osteoporosis   . Other specified rheumatoid arthritis, right shoulder (HCC) 08/01/2011  . Post-nasal drip    hx of  . Rheumatoid arthritis(714.0)    autoimmune arthritis; methotrexate once/week  . Urinary incontinence     Family History  Problem Relation Age of Onset  .  Arthritis Mother   . Heart disease Mother        ?psvt  . Breast cancer Mother 69       TAH/BSO  . Cancer Mother   . Mental illness Mother   . COPD Father   . Hypertension Father   . Alcohol abuse Father   . Mental illness Father   . Heart disease Father   . Healthy Daughter   . Breast cancer Maternal Aunt 27       deceased  . Cancer Cousin 83       female; unknown primary  . Colon cancer Paternal Aunt 94       deceased at 73  . Stomach cancer Paternal Uncle 64       deceased at 4  . Alcohol abuse Brother   . Cancer Brother   . Hodgkin's lymphoma Brother   . Mental illness Brother   . HIV Brother   . Healthy Brother   . Healthy Son     Past Surgical  History:  Procedure Laterality Date  . ANTERIOR CERVICAL DECOMP/DISCECTOMY FUSION N/A 03/15/2015   Procedure: Cervical five-six, Cerival six-seven, Anterior Cervical Discectomy and Fusion, Allograft and Plate;  Surgeon: Eldred Manges, MD;  Location: [facility];  Service: Orthopedics;  Laterality: N/A;  . BACK SURGERY  2011,16   lower back fusion l4-5, s1  . BLADDER SURGERY  2009  . BREAST ENHANCEMENT SURGERY  2003  . breast implants removed    . BREAST SURGERY  10/2013   REMOVAL OF BREAST IMPLANTS  . CERVICAL WOUND DEBRIDEMENT N/A 07/07/2016   Procedure: IRRIGATION AND DEBRIDEMENT POSTERIOR NECK;  Surgeon: Eldred Manges, MD;  Location: [facility];  Service: Orthopedics;  Laterality: N/A;  . INCISION AND DRAINAGE ABSCESS Right 01/16/2014   Procedure: INCISION AND DRAINAGE AND OF RIGHT BREAST ABCESS;  Surgeon: Glenna Fellows, MD;  Location: [facility];  Service: General;  Laterality: Right;  . JOINT REPLACEMENT    . LIPOSUCTION  2003   abdominal  . LUMBAR LAMINECTOMY/DECOMPRESSION MICRODISCECTOMY N/A 12/10/2015   Procedure: Right L3-4 Hemilaminectomy, Excision of herniated nucleus pulposus;  Surgeon: Eldred Manges, MD;  Location: [facility] OR;  Service: Orthopedics;  Laterality: N/A;  . MASS EXCISION  11/03/2011   Procedure: MINOR EXCISION OF MASS;  Surgeon: Wyn Forster., MD;  Location: [facility];  Service: Orthopedics;  Laterality: Left;  debride IP joint, cyst excision left index  . POSTERIOR CERVICAL FUSION/FORAMINOTOMY N/A 06/21/2016   Procedure: POSTERIOR CERVICAL FUSION C5-C7 SPINOUS PROCESS WIRING;  Surgeon: Eldred Manges, MD;  [facility]: [facility];  Service: Orthopedics;  Laterality: N/A;  . POSTERIOR FUSION CERVICAL SPINE  06/21/2016   C5 C7  . SHOULDER SURGERY  2012   right  . TONSILLECTOMY  1987  . TOTAL SHOULDER ARTHROPLASTY  08/01/2011   Procedure: TOTAL SHOULDER ARTHROPLASTY;  Surgeon: Eulas Post, MD;  Location: [facility] OR;  Service: Orthopedics;  Laterality: Right;  Right total  shoulder arthroplasty  . TUBAL LIGATION  1988   Social History   Occupational History  . Not on file.   Social History Main Topics  . Smoking status: Former Smoker    Packs/day: 0.50    Years: 10.00    Types: Cigarettes    Quit date: 12/06/2005  . Smokeless tobacco: Never Used     Comment: smoked for 10 years  . Alcohol use No     Comment: denied alcohol  . Drug use: No  Comment: denied any drug use with admission nurse  . Sexual activity: Yes    Birth control/ protection: Post-menopausal

## 2016-08-28 ENCOUNTER — Telehealth (INDEPENDENT_AMBULATORY_CARE_PROVIDER_SITE_OTHER): Payer: Self-pay | Admitting: Orthopaedic Surgery

## 2016-08-28 NOTE — Telephone Encounter (Signed)
Lauren from [facility] called asking for verbal orders for the wet-to-dry dressing changes. CB # [phone]

## 2016-08-28 NOTE — Telephone Encounter (Signed)
Please advise 

## 2016-08-28 NOTE — Telephone Encounter (Signed)
[facility] called stated patient is there now to see  Dr. Maurice Small and needs last ov note. I faxed to [phone]

## 2016-08-29 NOTE — Telephone Encounter (Signed)
Ok thanks 

## 2016-08-29 NOTE — Telephone Encounter (Signed)
I left voicemail for Lauren advising.

## 2016-08-30 ENCOUNTER — Telehealth (INDEPENDENT_AMBULATORY_CARE_PROVIDER_SITE_OTHER): Payer: Self-pay | Admitting: Radiology

## 2016-08-30 NOTE — Telephone Encounter (Signed)
Patient will not have enough antibiotics to get her through until follow up appt on 10/24/2016.  She will need additional scripts sent in.  She would also like to know if you could refill her Meloxicam?  Please advise.

## 2016-08-31 NOTE — Telephone Encounter (Signed)
oK SEND IT IN. Ok FOR MELOXICAM ALSO.

## 2016-09-01 MED ORDER — MELOXICAM 15 MG PO TABS: 15.0000 mg | ORAL_TABLET | Freq: Every day | ORAL | 1 refills | Status: DC

## 2016-09-01 NOTE — Telephone Encounter (Signed)
I called and spoke with patient. From her med list and notes, it appears Dr. Ninetta Lights sent in the antibiotics with refills which should get her through until her next appointment. I asked patient to contact her pharmacy and be sure they were going to be able to fill this for her since she is going out of the country. I also entered her Meloxicam refill into the system. She is aware.

## 2016-09-01 NOTE — Addendum Note (Signed)
Addended by: Marybelle Killings on: 09/01/2016 02:20 PM   Modules accepted: Orders

## 2016-09-12 ENCOUNTER — Telehealth (INDEPENDENT_AMBULATORY_CARE_PROVIDER_SITE_OTHER): Payer: Self-pay | Admitting: *Deleted

## 2016-09-12 ENCOUNTER — Telehealth (INDEPENDENT_AMBULATORY_CARE_PROVIDER_SITE_OTHER): Payer: Self-pay | Admitting: Orthopaedic Surgery

## 2016-09-12 ENCOUNTER — Encounter (INDEPENDENT_AMBULATORY_CARE_PROVIDER_SITE_OTHER): Payer: Self-pay | Admitting: Orthopaedic Surgery

## 2016-09-12 NOTE — Telephone Encounter (Signed)
Ok for new orders 

## 2016-09-12 NOTE — Telephone Encounter (Signed)
I attempted to call Lauren. Voice mailbox has not been set up. Will call back. OK for verbal orders per Dr. Ophelia Charter.

## 2016-09-12 NOTE — Telephone Encounter (Signed)
Ok thanks 

## 2016-09-12 NOTE — Telephone Encounter (Signed)
Pt called stating the home health needs new orders to continue packing for wound. [phone]

## 2016-09-12 NOTE — Telephone Encounter (Signed)
Kara [name] with [facility] called needing verbal orders to continue Home Care 3 wk 5 for WD care and Hypertension. The number to contact [name] is [phone]

## 2016-09-13 NOTE — Telephone Encounter (Signed)
Order faxed to [facility]. 

## 2016-09-13 NOTE — Telephone Encounter (Signed)
Order faxed.

## 2016-09-13 NOTE — Telephone Encounter (Signed)
I spoke with Kara [name], she needs written on script.  Fax to [facility], [phone]

## 2016-10-20 ENCOUNTER — Encounter: Payer: Self-pay | Admitting: Family Medicine

## 2016-10-24 ENCOUNTER — Ambulatory Visit (INDEPENDENT_AMBULATORY_CARE_PROVIDER_SITE_OTHER): Payer: Managed Care, Other (non HMO) | Admitting: Orthopaedic Surgery

## 2016-10-24 ENCOUNTER — Encounter (INDEPENDENT_AMBULATORY_CARE_PROVIDER_SITE_OTHER): Payer: Self-pay | Admitting: Orthopaedic Surgery

## 2016-10-24 VITALS — BP 131/84 | HR 75 | Ht 64.0 in | Wt 155.0 lb

## 2016-10-24 DIAGNOSIS — Z9889 Other specified postprocedural states: Secondary | ICD-10-CM

## 2016-10-24 DIAGNOSIS — T148XXD Other injury of unspecified body region, subsequent encounter: Secondary | ICD-10-CM

## 2016-10-24 DIAGNOSIS — Z981 Arthrodesis status: Secondary | ICD-10-CM | POA: Diagnosis not present

## 2016-10-24 NOTE — Progress Notes (Signed)
Office Visit Note   Patient: Kara [name]           Date of Birth: [birth year]           MRN: [id number] Visit Date: 10/24/2016              Requested by: Natalia Leatherwood, DO [address] [place], [place] [address] PCP: Natalia Leatherwood, DO   Assessment & Plan: Visit Diagnoses:  1. S/P cervical spinal fusion   2. Spinal surgery in prior 3 months     Plan: She will return in 2 months. Continue changing her dressing. She is happy with the surgical result and can return for follow-up visit in 2 months. Once her neck is healed she can go see Dr. Maurice Small for injection over lumbar spine is treated her previously.  Follow-Up Instructions: Return in about 2 months (around 12/24/2016).   Orders:  No orders of the defined types were placed in this encounter.  No orders of the defined types were placed in this encounter.     Procedures: No procedures performed   Clinical Data: No additional findings.   Subjective: Chief Complaint  Patient presents with  . Neck - Follow-up    HPI patient returns post posterior cervical spine fusion for pseudoarthrosis with postoperative infection requiring repeat dressing changes and back. She just got back from trip to [place] and now has a quarter-sized area which is 8-9 mm in depth with no necrotic tissue at the base.  Review of Systems unchanged from her surgery in April of this year other than as mentioned in history of present illness.   Objective: Vital Signs: BP 131/84   Pulse 75   Ht 5\' 4"  (1.626 m)   Wt 155 lb (70.3 kg)   BMI 26.61 kg/m   Physical Exam  Constitutional: She is oriented to person, place, and time. She appears well-developed.  HENT:  Head: Normocephalic.  Right Ear: External ear normal.  Left Ear: External ear normal.  Eyes: Pupils are equal, round, and reactive to light.  Neck: No tracheal deviation present. No thyromegaly present.  Cardiovascular: Normal rate.   Pulmonary/Chest: Effort normal.  Abdominal: Soft.   Musculoskeletal:  Posterior cervical incisions probed with sterile Q-tip. No spinous process is palpable no necrotic tissue addition granulation tissue she is continue to change the dressing daily currently has a Band-Aid over it. She states her neck feels good she still has some back pain symptoms above solid fusion L4-S1 with some narrowing at L3-4.  Neurological: She is alert and oriented to person, place, and time.  Skin: Skin is warm and dry.  Psychiatric: She has a normal mood and affect. Her behavior is normal.    Ortho Exam  Specialty Comments:  No specialty comments available.  Imaging: No results found.   PMFS History: Patient Active Problem List   Diagnosis Date Noted  . Essential hypertension 07/26/2016  . Acne 07/26/2016  . DNR (do not resuscitate) discussion   . Palliative care by specialist   . Post-operative infection 07/06/2016  . SIRS (systemic inflammatory response syndrome) (HCC) 07/05/2016  . Wound infection 07/05/2016  . Cervical pseudoarthrosis (HCC) 06/21/2016  . Opioid dependence in remission (HCC) 06/14/2016  . Mitral valve prolapse 01/04/2016  . Skin infection 12/22/2015  . HNP (herniated nucleus pulposus), lumbar 12/10/2015  . Prediabetes 08/19/2015  . BMI 26.0-26.9,adult 08/18/2015  . Chronic pain 08/18/2015  . Vitamin D deficiency 08/18/2015  . Chronic fatigue disorder 06/14/2015  . S/P  cervical spinal fusion 03/15/2015  . PTSD (post-traumatic stress disorder) 03/07/2014  . Severe recurrent major depression without psychotic features (HCC) 03/06/2014  . Suicide threat or attempt 03/06/2014  . Aggressive behavior   . Family history of malignant neoplasm of breast   . Rheumatoid arthritis (HCC) 12/13/2011  . Fibromyalgia 12/13/2011  . H/O cold sores 12/13/2011   Past Medical History:  Diagnosis Date  . ADD (attention deficit disorder)    on Adderal  . Aggressive behavior of adult   . Allergy   . Anxiety   . Arthritis   . Asthma     as a child  . Cellulitis of breast 11/2013   RIGHT BREAST  . 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 Chronic kidney disease    due to infection from stone- no regular visits- fine now  . Chronic pain    goes to [facility] for pain control  . Cold sore   . 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 Depression    takes meds daily  . Family history of malignant neoplasm of breast   . Fibromyalgia   . Nephrolithiasis   . Osteoporosis   . Other specified rheumatoid arthritis, right shoulder (HCC) 08/01/2011  . Post-nasal drip    hx of  . Rheumatoid arthritis(714.0)    autoimmune arthritis; methotrexate once/week  . Urinary incontinence     Family History  Problem Relation Age of Onset  . Arthritis Mother   . Heart disease Mother        ?psvt  . Breast cancer Mother 70       TAH/BSO  . Cancer Mother   . Mental illness Mother   . COPD Father   . Hypertension Father   . Alcohol abuse Father   . Mental illness Father   . Heart disease Father   . Healthy Daughter   . Breast cancer Maternal Aunt 27       deceased  . Cancer Cousin 9       female; unknown primary  . Colon cancer Paternal Aunt 9       deceased at 21  . Stomach cancer Paternal Uncle 59       deceased at 10  . Alcohol abuse Brother   . Cancer Brother   . Hodgkin's lymphoma Brother   . Mental illness Brother   . HIV Brother   . Healthy Brother   . Healthy Son     Past Surgical History:  Procedure Laterality Date  . ANTERIOR CERVICAL DECOMP/DISCECTOMY FUSION N/A 03/15/2015   Procedure: Cervical five-six, Cerival six-seven, Anterior Cervical Discectomy and Fusion, Allograft and Plate;  Surgeon: Eldred Manges, MD;  Location: [facility];  Service: Orthopedics;  Laterality: N/A;  . BACK SURGERY  2011,16   lower back fusion l4-5, s1  . BLADDER SURGERY  2009  . BREAST ENHANCEMENT SURGERY  2003  . breast implants removed    . BREAST SURGERY  10/2013    REMOVAL OF BREAST IMPLANTS  . CERVICAL WOUND DEBRIDEMENT N/A 07/07/2016   Procedure: IRRIGATION AND DEBRIDEMENT POSTERIOR NECK;  Surgeon: Eldred Manges, MD;  Location: [facility];  Service: Orthopedics;  Laterality: N/A;  . INCISION AND DRAINAGE ABSCESS Right 01/16/2014   Procedure: INCISION AND DRAINAGE AND OF RIGHT BREAST ABCESS;  Surgeon: Glenna Fellows, MD;  Location: [facility];  Service: General;  Laterality: Right;  . JOINT REPLACEMENT    .  LIPOSUCTION  2003   abdominal  . LUMBAR LAMINECTOMY/DECOMPRESSION MICRODISCECTOMY N/A 12/10/2015   Procedure: Right L3-4 Hemilaminectomy, Excision of herniated nucleus pulposus;  Surgeon: Eldred Manges, MD;  Location: [facility] OR;  Service: Orthopedics;  Laterality: N/A;  . MASS EXCISION  11/03/2011   Procedure: MINOR EXCISION OF MASS;  Surgeon: Wyn Forster., MD;  Location: [facility];  Service: Orthopedics;  Laterality: Left;  debride IP joint, cyst excision left index  . POSTERIOR CERVICAL FUSION/FORAMINOTOMY N/A 06/21/2016   Procedure: POSTERIOR CERVICAL FUSION C5-C7 SPINOUS PROCESS WIRING;  Surgeon: Eldred Manges, MD;  Location: [facility];  Service: Orthopedics;  Laterality: N/A;  . POSTERIOR FUSION CERVICAL SPINE  06/21/2016   C5 C7  . SHOULDER SURGERY  2012   right  . TONSILLECTOMY  1987  . TOTAL SHOULDER ARTHROPLASTY  08/01/2011   Procedure: TOTAL SHOULDER ARTHROPLASTY;  Surgeon: Eulas Post, MD;  Location: [facility];  Service: Orthopedics;  Laterality: Right;  Right total shoulder arthroplasty  . TUBAL LIGATION  1988   Social History   Occupational History  . Not on file.   Social History Main Topics  . Smoking status: Former Smoker    Packs/day: 0.50    Years: 10.00    Types: Cigarettes    Quit date: 12/06/2005  . Smokeless tobacco: Never Used     Comment: smoked for 10 years  . Alcohol use No     Comment: denied alcohol  . Drug use: No     Comment: denied any drug use with admission nurse  . Sexual activity: Yes    Birth  control/ protection: Post-menopausal

## 2016-10-26 ENCOUNTER — Other Ambulatory Visit (INDEPENDENT_AMBULATORY_CARE_PROVIDER_SITE_OTHER): Payer: Self-pay | Admitting: Orthopaedic Surgery

## 2016-10-26 NOTE — Telephone Encounter (Signed)
OK 

## 2016-10-26 NOTE — Telephone Encounter (Signed)
Can you advise on refill since Dr. Ophelia Charter is out of the office? Thanks.

## 2016-10-31 ENCOUNTER — Ambulatory Visit: Payer: Managed Care, Other (non HMO) | Admitting: Family Medicine

## 2016-10-31 ENCOUNTER — Encounter: Payer: Self-pay | Admitting: Family Medicine

## 2016-11-08 ENCOUNTER — Ambulatory Visit: Payer: Self-pay | Admitting: Infectious Diseases

## 2016-11-11 ENCOUNTER — Encounter (INDEPENDENT_AMBULATORY_CARE_PROVIDER_SITE_OTHER): Payer: Self-pay | Admitting: Orthopaedic Surgery

## 2016-11-11 ENCOUNTER — Encounter (HOSPITAL_COMMUNITY): Payer: Self-pay

## 2016-11-11 DIAGNOSIS — Z79899 Other long term (current) drug therapy: Secondary | ICD-10-CM | POA: Insufficient documentation

## 2016-11-11 DIAGNOSIS — N189 Chronic kidney disease, unspecified: Secondary | ICD-10-CM | POA: Insufficient documentation

## 2016-11-11 DIAGNOSIS — J45909 Unspecified asthma, uncomplicated: Secondary | ICD-10-CM | POA: Diagnosis not present

## 2016-11-11 DIAGNOSIS — I129 Hypertensive chronic kidney disease with stage 1 through stage 4 chronic kidney disease, or unspecified chronic kidney disease: Secondary | ICD-10-CM | POA: Insufficient documentation

## 2016-11-11 DIAGNOSIS — G8929 Other chronic pain: Secondary | ICD-10-CM | POA: Diagnosis not present

## 2016-11-11 DIAGNOSIS — R51 Headache: Secondary | ICD-10-CM | POA: Diagnosis not present

## 2016-11-11 DIAGNOSIS — R52 Pain, unspecified: Secondary | ICD-10-CM | POA: Insufficient documentation

## 2016-11-11 DIAGNOSIS — J329 Chronic sinusitis, unspecified: Secondary | ICD-10-CM | POA: Insufficient documentation

## 2016-11-11 DIAGNOSIS — R05 Cough: Secondary | ICD-10-CM | POA: Diagnosis present

## 2016-11-11 DIAGNOSIS — Z87891 Personal history of nicotine dependence: Secondary | ICD-10-CM | POA: Diagnosis not present

## 2016-11-11 NOTE — ED Notes (Signed)
Pt called for triage, no answer

## 2016-11-12 ENCOUNTER — Emergency Department (HOSPITAL_COMMUNITY)
Admission: EM | Admit: 2016-11-12 | Discharge: 2016-11-12 | Disposition: A | Discharge status: Home / Self Care | Payer: Managed Care, Other (non HMO) | Source: Home / Self Care

## 2016-11-12 ENCOUNTER — Encounter (HOSPITAL_COMMUNITY): Payer: Self-pay | Admitting: *Deleted

## 2016-11-12 ENCOUNTER — Emergency Department (HOSPITAL_COMMUNITY)
Admission: EM | Admit: 2016-11-12 | Discharge: 2016-11-12 | Disposition: A | Discharge status: Home / Self Care | Payer: Managed Care, Other (non HMO) | Attending: Emergency Medicine | Admitting: Emergency Medicine

## 2016-11-12 ENCOUNTER — Encounter (HOSPITAL_COMMUNITY): Payer: Self-pay | Admitting: Emergency Medicine

## 2016-11-12 ENCOUNTER — Emergency Department (HOSPITAL_COMMUNITY): Payer: Managed Care, Other (non HMO)

## 2016-11-12 ENCOUNTER — Emergency Department (HOSPITAL_COMMUNITY)
Admission: EM | Admit: 2016-11-12 | Discharge: 2016-11-12 | Discharge status: Against Medical Advice | Payer: Managed Care, Other (non HMO) | Source: Home / Self Care | Attending: Emergency Medicine | Admitting: Emergency Medicine

## 2016-11-12 ENCOUNTER — Encounter (INDEPENDENT_AMBULATORY_CARE_PROVIDER_SITE_OTHER): Payer: Self-pay | Admitting: Orthopaedic Surgery

## 2016-11-12 DIAGNOSIS — Z5321 Procedure and treatment not carried out due to patient leaving prior to being seen by health care provider: Secondary | ICD-10-CM | POA: Insufficient documentation

## 2016-11-12 DIAGNOSIS — R05 Cough: Secondary | ICD-10-CM

## 2016-11-12 DIAGNOSIS — Z87891 Personal history of nicotine dependence: Secondary | ICD-10-CM | POA: Insufficient documentation

## 2016-11-12 DIAGNOSIS — R51 Headache: Secondary | ICD-10-CM

## 2016-11-12 DIAGNOSIS — G894 Chronic pain syndrome: Secondary | ICD-10-CM | POA: Insufficient documentation

## 2016-11-12 DIAGNOSIS — J45909 Unspecified asthma, uncomplicated: Secondary | ICD-10-CM | POA: Insufficient documentation

## 2016-11-12 DIAGNOSIS — Z765 Malingerer [conscious simulation]: Secondary | ICD-10-CM

## 2016-11-12 DIAGNOSIS — Z8659 Personal history of other mental and behavioral disorders: Secondary | ICD-10-CM

## 2016-11-12 DIAGNOSIS — R059 Cough, unspecified: Secondary | ICD-10-CM

## 2016-11-12 DIAGNOSIS — N189 Chronic kidney disease, unspecified: Secondary | ICD-10-CM | POA: Insufficient documentation

## 2016-11-12 DIAGNOSIS — R52 Pain, unspecified: Secondary | ICD-10-CM

## 2016-11-12 DIAGNOSIS — M791 Myalgia: Secondary | ICD-10-CM | POA: Insufficient documentation

## 2016-11-12 DIAGNOSIS — Z5320 Procedure and treatment not carried out because of patient's decision for unspecified reasons: Secondary | ICD-10-CM | POA: Insufficient documentation

## 2016-11-12 DIAGNOSIS — J329 Chronic sinusitis, unspecified: Secondary | ICD-10-CM

## 2016-11-12 DIAGNOSIS — I129 Hypertensive chronic kidney disease with stage 1 through stage 4 chronic kidney disease, or unspecified chronic kidney disease: Secondary | ICD-10-CM | POA: Insufficient documentation

## 2016-11-12 DIAGNOSIS — Z79899 Other long term (current) drug therapy: Secondary | ICD-10-CM

## 2016-11-12 DIAGNOSIS — M797 Fibromyalgia: Secondary | ICD-10-CM | POA: Insufficient documentation

## 2016-11-12 DIAGNOSIS — R519 Headache, unspecified: Secondary | ICD-10-CM

## 2016-11-12 MED ORDER — FLUTICASONE PROPIONATE 50 MCG/ACT NA SUSP: 2.0000 | Freq: Every day | NASAL | 2 refills | Status: DC

## 2016-11-12 MED ORDER — HYDROMORPHONE HCL 1 MG/ML IJ SOLN
1.0000 mg | Freq: Once | INTRAMUSCULAR | Status: AC
  Administered 2016-11-12: 1 mg via INTRAMUSCULAR
  Filled 2016-11-12: qty 1

## 2016-11-12 NOTE — ED Notes (Signed)
Pt came to [facility] asking how much longer she had to wait.  [facility] explained to her that the longest wait at this time was approx 2 hours.  Pt st's I can't wait that long "I'am in pain" again [facility] Alva Garnet  RN explained to pt that they were trying to get her a room as soon as they could.  Pt became angry told her husband that they were leaving and pt walked out without any difficulty and fast.

## 2016-11-12 NOTE — ED Provider Notes (Signed)
[facility]-EMERGENCY DEPT Provider Note   CSN: [id number] Arrival date & time: 11/11/16  2325     History   Chief Complaint Chief Complaint  Patient presents with  . Fibromyalgia    HPI Ardena Gangl is a 61 y.o. female.  HPI Bridgette Wolden is a 61 y.o. female with history of chronic fatigue immune dysfunction syndrome, fibromyalgia, rheumatoid arthritis, chronic pain, presents to emergency department complaining of fibromyalgia flare and pain. Patient states she just cut back from [place] and is mildly where she location for 3 weeks. She states since getting back she has had persistent cough that has been worsening. She states that she is having productive cough with white sputum. Denies any shortness of breath or chest pain. She states she is having pain all over, states it's "head to toe." She states that nothing that she has tried is making her pain better or worse. She states when "my fibromyalgia gets this that I need Dilaudid." She denies any fever or chills. No nausea or vomiting. Denies any urinary symptoms. She states that she had cervical surgery and has had some complications where her incision is still open and she is currently followed by Dr. Ophelia Charter for wound care. She does not have an appointment with him yet for follow-up. States just saw him 3 wks ago.   Past Medical History:  Diagnosis Date  . ADD (attention deficit disorder)    on Adderal  . Aggressive behavior of adult   . Allergy   . Anxiety   . Arthritis   . Asthma    as a child  . Cellulitis of breast 11/2013   RIGHT BREAST  . 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 Chronic kidney disease    due to infection from stone- no regular visits- fine now  . Chronic pain    goes to [facility] for pain control  . Cold sore   . 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 Depression    takes meds daily  . Family history of malignant neoplasm of breast   .  Fibromyalgia   . Nephrolithiasis   . Osteoporosis   . Other specified rheumatoid arthritis, right shoulder (HCC) 08/01/2011  . Post-nasal drip    hx of  . Rheumatoid arthritis(714.0)    autoimmune arthritis; methotrexate once/week  . Urinary incontinence     Patient Active Problem List   Diagnosis Date Noted  . Essential hypertension 07/26/2016  . Acne 07/26/2016  . DNR (do not resuscitate) discussion   . Palliative care by specialist   . Post-operative infection 07/06/2016  . SIRS (systemic inflammatory response syndrome) (HCC) 07/05/2016  . Wound infection 07/05/2016  . Cervical pseudoarthrosis (HCC) 06/21/2016  . Opioid dependence in remission (HCC) 06/14/2016  . Mitral valve prolapse 01/04/2016  . Skin infection 12/22/2015  . HNP (herniated nucleus pulposus), lumbar 12/10/2015  . Prediabetes 08/19/2015  . BMI 26.0-26.9,adult 08/18/2015  . Chronic pain 08/18/2015  . Vitamin D deficiency 08/18/2015  . Chronic fatigue disorder 06/14/2015  . S/P cervical spinal fusion 03/15/2015  . PTSD (post-traumatic stress disorder) 03/07/2014  . Severe recurrent major depression without psychotic features (HCC) 03/06/2014  . Suicide threat or attempt 03/06/2014  . Aggressive behavior   . Family history of malignant neoplasm of breast   . Rheumatoid arthritis (HCC) 12/13/2011  . Fibromyalgia 12/13/2011  . H/O cold sores 12/13/2011    Past Surgical History:  Procedure Laterality  Date  . ANTERIOR CERVICAL DECOMP/DISCECTOMY FUSION N/A 03/15/2015   Procedure: Cervical five-six, Cerival six-seven, Anterior Cervical Discectomy and Fusion, Allograft and Plate;  Surgeon: Eldred Manges, MD;  Location: [facility] OR;  Service: Orthopedics;  Laterality: N/A;  . BACK SURGERY  2011,16   lower back fusion l4-5, s1  . BLADDER SURGERY  2009  . BREAST ENHANCEMENT SURGERY  2003  . breast implants removed    . BREAST SURGERY  10/2013   REMOVAL OF BREAST IMPLANTS  . CERVICAL WOUND DEBRIDEMENT N/A 07/07/2016    Procedure: IRRIGATION AND DEBRIDEMENT POSTERIOR NECK;  Surgeon: Eldred Manges, MD;  Location: [facility] OR;  Service: Orthopedics;  Laterality: N/A;  . INCISION AND DRAINAGE ABSCESS Right 01/16/2014   Procedure: INCISION AND DRAINAGE AND OF RIGHT BREAST ABCESS;  Surgeon: Glenna Fellows, MD;  Location: [facility];  Service: General;  Laterality: Right;  . JOINT REPLACEMENT    . LIPOSUCTION  2003   abdominal  . LUMBAR LAMINECTOMY/DECOMPRESSION MICRODISCECTOMY N/A 12/10/2015   Procedure: Right L3-4 Hemilaminectomy, Excision of herniated nucleus pulposus;  Surgeon: Eldred Manges, MD;  Location: [facility] OR;  Service: Orthopedics;  Laterality: N/A;  . MASS EXCISION  11/03/2011   Procedure: MINOR EXCISION OF MASS;  Surgeon: Wyn Forster., MD;  Location: [facility];  Service: Orthopedics;  Laterality: Left;  debride IP joint, cyst excision left index  . POSTERIOR CERVICAL FUSION/FORAMINOTOMY N/A 06/21/2016   Procedure: POSTERIOR CERVICAL FUSION C5-C7 SPINOUS PROCESS WIRING;  Surgeon: Eldred Manges, MD;  Location: [facility] OR;  Service: Orthopedics;  Laterality: N/A;  . POSTERIOR FUSION CERVICAL SPINE  06/21/2016   C5 C7  . SHOULDER SURGERY  2012   right  . TONSILLECTOMY  1987  . TOTAL SHOULDER ARTHROPLASTY  08/01/2011   Procedure: TOTAL SHOULDER ARTHROPLASTY;  Surgeon: Eulas Post, MD;  Location: [facility] OR;  Service: Orthopedics;  Laterality: Right;  Right total shoulder arthroplasty  . TUBAL LIGATION  1988    OB History    No data available       Home Medications    Prior to Admission medications   Medication Sig Start Date End Date Taking? Authorizing Provider  ADDERALL XR 30 MG 24 hr capsule Take 60 mg by mouth daily. Takes 2 capsules in the morning 11/02/15   [provider]  ALPRAZolam Prudy Feeler) 1 MG tablet Take 1 mg by mouth 3 (three) times daily as needed for anxiety.  07/06/14   [provider]  amphetamine-dextroamphetamine (ADDERALL) 30 MG tablet Take 30 mg by mouth daily.  Before 4 pm 04/29/16   [provider]  cephALEXin (KEFLEX) 500 MG capsule Take 2 capsules (1,000 mg total) by mouth 2 (two) times daily. Patient not taking: Reported on 10/24/2016 08/22/16   Ginnie Smart, MD  cyclobenzaprine (FLEXERIL) 10 MG tablet Take 1 tablet (10 mg total) by mouth 3 (three) times daily as needed for muscle spasms. Patient not taking: Reported on 10/24/2016 08/23/16   Eldred Manges, MD  diclofenac (VOLTAREN) 75 MG EC tablet Take 1 tablet (75 mg total) by mouth 2 (two) times daily. 05/02/16   Eldred Manges, MD  Diclofenac Sodium (PENNSAID) 2 % SOLN Place 2 g onto the skin 4 (four) times daily as needed. Patient taking differently: Place 2 g onto the skin 2 (two) times daily as needed (joint pain).  02/24/16   Tarry Kos, MD  HYDROcodone-acetaminophen (NORCO/VICODIN) 5-325 MG tablet TAKE 1 TABLET BY MOUTH EVERY 6 HOURS  AS NEEDED FOR dental PAIN 10/23/16   [provider]  lisinopril (PRINIVIL,ZESTRIL) 10 MG tablet Take 1 tablet (10 mg total) by mouth daily. 08/16/16   Kuneff, Renee A, DO  meloxicam (MOBIC) 15 MG tablet TAKE 1 TABLET BY MOUTH EVERY DAY 10/27/16   Naida Sleight, PA-C  methocarbamol (ROBAXIN) 500 MG tablet Take 1 tablet (500 mg total) by mouth 4 (four) times daily. Patient not taking: Reported on 08/22/2016 06/22/16   Eldred Manges, MD  potassium chloride SA (K-DUR,KLOR-CON) 20 MEQ tablet Take 3 tablets (60 mEq total) by mouth once. 08/09/16 08/09/16  Kuppelweiser, Cassie L, RPH  rifampin (RIFADIN) 300 MG capsule Take 1 capsule (300 mg total) by mouth daily. Patient not taking: Reported on 10/24/2016 08/22/16   Ginnie Smart, MD  tretinoin (RETIN-A) 0.1 % cream Apply topically at bedtime. 07/25/16   Kuneff, Renee A, DO  valACYclovir (VALTREX) 1000 MG tablet Take two tablets onset of coldsore and repeat 2 tablets in 12 hours 08/16/16   Kuneff, Renee A, DO  venlafaxine XR (EFFEXOR-XR) 75 MG 24 hr capsule Take 75 mg by mouth daily with breakfast. 37.5 mg  for total of 112.5mg     [provider]  zolpidem (AMBIEN) 10 MG tablet TAKE 1 TABLET (10 mg) BY MOUTH AT BEDTIME AS NEEDED for sleep 05/23/15   [provider]    Family History Family History  Problem Relation Age of Onset  . Arthritis Mother   . Heart disease Mother        ?psvt  . Breast cancer Mother 18       TAH/BSO  . Cancer Mother   . Mental illness Mother   . COPD Father   . Hypertension Father   . Alcohol abuse Father   . Mental illness Father   . Heart disease Father   . Healthy Daughter   . Breast cancer Maternal Aunt 27       deceased  . Cancer Cousin 33       female; unknown primary  . Colon cancer Paternal Aunt 85       deceased at 33  . Stomach cancer Paternal Uncle 69       deceased at 3  . Alcohol abuse Brother   . Cancer Brother   . Hodgkin's lymphoma Brother   . Mental illness Brother   . HIV Brother   . Healthy Brother   . Healthy Son     Social History Social History  Substance Use Topics  . Smoking status: Former Smoker    Packs/day: 0.50    Years: 10.00    Types: Cigarettes    Quit date: 12/06/2005  . Smokeless tobacco: Never Used     Comment: smoked for 10 years  . Alcohol use No     Comment: denied alcohol     Allergies   Aspirin; Ibuprofen; Ativan [lorazepam]; Ketamine; Toradol [ketorolac tromethamine]; and Tramadol hcl   Review of Systems Review of Systems  Constitutional: Negative for chills and fever.  Respiratory: Positive for cough. Negative for chest tightness and shortness of breath.   Cardiovascular: Negative for chest pain, palpitations and leg swelling.  Gastrointestinal: Negative for abdominal pain, diarrhea, nausea and vomiting.  Genitourinary: Negative for dysuria, flank pain, pelvic pain, vaginal bleeding, vaginal discharge and vaginal pain.  Musculoskeletal: Positive for arthralgias, back pain, myalgias and neck pain. Negative for neck stiffness.  Skin: Negative for rash.  Neurological:  Negative for dizziness, weakness and headaches.  All other  systems reviewed and are negative.    Physical Exam Updated Vital Signs BP 115/88 (BP Location: Left Arm)   Pulse 89   Temp 98 F (36.7 C) (Oral)   Resp 16   Ht 5\' 4"  (1.626 m)   Wt 70.3 kg (155 lb)   SpO2 98%   BMI 26.61 kg/m   Physical Exam  Constitutional: She appears well-developed and well-nourished. No distress.  HENT:  Head: Normocephalic.  Eyes: Conjunctivae are normal.  Neck: Neck supple.  Cardiovascular: Normal rate, regular rhythm and normal heart sounds.   Pulmonary/Chest: Effort normal and breath sounds normal. No respiratory distress. She has no wheezes. She has no rales.  Abdominal: Soft. Bowel sounds are normal. She exhibits no distension. There is no tenderness. There is no rebound.  Musculoskeletal: She exhibits no edema.  Diffuse bony and muscle tenderness including her upper and lower extremities, back, neck  Neurological: She is alert.  Skin: Skin is warm and dry.  Psychiatric: She has a normal mood and affect. Her behavior is normal.  Nursing note and vitals reviewed.    ED Treatments / Results  Labs (all labs ordered are listed, but only abnormal results are displayed) Labs Reviewed - No data to display  EKG  EKG Interpretation None       Radiology Dg Chest 2 View  Result Date: 11/12/2016 CLINICAL DATA:  Chest pain, weakness for 1 day. EXAM: CHEST  2 VIEW COMPARISON:  Chest radiograph July 05, 2016 FINDINGS: Cardiomediastinal silhouette is normal. No pleural effusions or focal consolidations. Trachea projects midline and there is no pneumothorax. Soft tissue planes and included osseous structures are non-suspicious. RIGHT shoulder arthroplasty. ACDF and posterior cerclage wires. IMPRESSION: Negative chest. Electronically Signed   By: Awilda Metro M.D.   On: 11/12/2016 01:15    Procedures Procedures (including critical care time)  Medications Ordered in ED Medications    HYDROmorphone (DILAUDID) injection 1 mg (1 mg Intramuscular Given 11/12/16 0032)     Initial Impression / Assessment and Plan / ED Course  I have reviewed the triage vital signs and the nursing notes.  Pertinent labs & imaging results that were available during my care of the patient were reviewed by me and considered in my medical decision making (see chart for details).     Pt in ED with fibromyalgia flare and cough. She states that the only thing that works for her when she is in so much pain is a shot of dilaudid and pain meds until she is able to see her doctor. Will get CXR. Will give dilaudid 1mg  IM. I reviewed database, pt's last prescription that is documented Is for oxycodone 12 tablets written 19 days ago. We discussed her chronic pain and that we do not prescribe opioid pain medications from ED. Pt voiced understanding. Pending CXR.    1:22 AM Chest x-ray is negative. I suspect patient's symptoms could be from postnasal drainage that she is reporting. I will start her on Flonase. Again discussed treatment of her chronic pain, for which I do not think opiates would be the right solution. Discussed maybe following up with possibly a pain clinic, however patient stated "they just drug you up, I do not want him his drugs." Advised to try to follow-up with maybe different pain clinic specialist or a primary care doctor. I reviewed patient's chart, patient with heavy opiate dependence in the past due to her chronic pain issues, and even has been admitted for accidental opiate overdose. I do not  feel comfortable prescribing this patient opiate medications. Advised to call her doctor tomorrow. Patient agreed.  Vitals:   11/11/16 2350 11/11/16 2358  BP: 115/88   Pulse: 89   Resp: 16   Temp: 98 F (36.7 C)   TempSrc: Oral   SpO2: 98%   Weight:  70.3 kg (155 lb)  Height:  5\' 4"  (1.626 m)      Final Clinical Impressions(s) / ED Diagnoses   Final diagnoses:  Diffuse pain  Cough   Nonintractable headache, unspecified chronicity pattern, unspecified headache type  Purulent postnasal drainage    New Prescriptions New Prescriptions   FLUTICASONE (FLONASE) 50 MCG/ACT NASAL SPRAY    Place 2 sprays into both nostrils daily.     Jaynie Crumble, PA-C 11/12/16 0125    Melene Plan, DO 11/12/16 [phone]

## 2016-11-12 NOTE — ED Triage Notes (Signed)
Pt from home c/o worsening fibromyalgia pain x 3 days - states she has multiple chronic diseases and hasn't had a flare in a long time. Pt states she hurts from head to toe, but her posterior neck and back are the worst. Pt also endorses headache. Pt ambulatory with steady gait, denies dizziness/vision changes.

## 2016-11-12 NOTE — ED Notes (Signed)
Patient in lobby asked have a phone called made. She gave me a phone # Phone given to patient. She asked the person to come pick her up.

## 2016-11-12 NOTE — ED Notes (Signed)
EDP went to go see pt and no where to be found. Pt eloped

## 2016-11-12 NOTE — ED Notes (Signed)
FAST exam performed at [facility]. Pt passed FAST exam.
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
## 2016-11-12 NOTE — Discharge Instructions (Signed)
Use flonase daily for sinus congestion and postnasal drainage.  We do not prescribe opioid pain medications for chronic pain in emergency department. Please follow up with family doctor or pain specialist as needed.

## 2016-11-12 NOTE — ED Triage Notes (Signed)
Pt reports hx of chronic pain from fibromyalgia, was seen here yesterday for the same. Pt reports she has been feeling confused since yesterday, but pt is a/ox4, resp e/u, nad. No neuro deficits noted in triage.

## 2016-11-12 NOTE — ED Notes (Signed)
Patient transported to X-ray 
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
## 2016-11-13 ENCOUNTER — Emergency Department (HOSPITAL_COMMUNITY): Payer: Managed Care, Other (non HMO)

## 2016-11-13 ENCOUNTER — Emergency Department (HOSPITAL_COMMUNITY)
Admission: EM | Admit: 2016-11-13 | Discharge: 2016-11-13 | Disposition: A | Discharge status: Home / Self Care | Payer: Managed Care, Other (non HMO) | Attending: Emergency Medicine | Admitting: Emergency Medicine

## 2016-11-13 DIAGNOSIS — N189 Chronic kidney disease, unspecified: Secondary | ICD-10-CM | POA: Insufficient documentation

## 2016-11-13 DIAGNOSIS — Z87891 Personal history of nicotine dependence: Secondary | ICD-10-CM | POA: Insufficient documentation

## 2016-11-13 DIAGNOSIS — F909 Attention-deficit hyperactivity disorder, unspecified type: Secondary | ICD-10-CM | POA: Insufficient documentation

## 2016-11-13 DIAGNOSIS — R42 Dizziness and giddiness: Secondary | ICD-10-CM | POA: Diagnosis present

## 2016-11-13 DIAGNOSIS — Z79899 Other long term (current) drug therapy: Secondary | ICD-10-CM | POA: Diagnosis not present

## 2016-11-13 DIAGNOSIS — R4182 Altered mental status, unspecified: Secondary | ICD-10-CM | POA: Diagnosis not present

## 2016-11-13 DIAGNOSIS — J45909 Unspecified asthma, uncomplicated: Secondary | ICD-10-CM | POA: Diagnosis not present

## 2016-11-13 DIAGNOSIS — R531 Weakness: Secondary | ICD-10-CM | POA: Diagnosis not present

## 2016-11-13 DIAGNOSIS — I129 Hypertensive chronic kidney disease with stage 1 through stage 4 chronic kidney disease, or unspecified chronic kidney disease: Secondary | ICD-10-CM | POA: Insufficient documentation

## 2016-11-13 LAB — URINALYSIS, ROUTINE W REFLEX MICROSCOPIC
Bacteria, UA: NONE SEEN
Bilirubin Urine: NEGATIVE
GLUCOSE, UA: NEGATIVE mg/dL
Ketones, ur: NEGATIVE mg/dL
Nitrite: NEGATIVE
PH: 5 (ref 5.0–8.0)
Protein, ur: NEGATIVE mg/dL
SPECIFIC GRAVITY, URINE: 1.016 (ref 1.005–1.030)
SQUAMOUS EPITHELIAL / LPF: NONE SEEN

## 2016-11-13 MED ORDER — FENTANYL CITRATE (PF) 100 MCG/2ML IJ SOLN
50.0000 ug | Freq: Once | INTRAMUSCULAR | Status: AC
  Administered 2016-11-13: 50 ug via INTRAVENOUS
  Filled 2016-11-13: qty 2

## 2016-11-13 MED ORDER — DIPHENHYDRAMINE HCL 50 MG/ML IJ SOLN
12.5000 mg | Freq: Once | INTRAMUSCULAR | Status: AC
  Administered 2016-11-13: 12.5 mg via INTRAVENOUS
  Filled 2016-11-13: qty 1

## 2016-11-13 MED ORDER — HALOPERIDOL LACTATE 5 MG/ML IJ SOLN
2.0000 mg | Freq: Once | INTRAMUSCULAR | Status: AC
  Administered 2016-11-13: 2 mg via INTRAVENOUS
  Filled 2016-11-13: qty 1

## 2016-11-13 MED ORDER — SODIUM CHLORIDE 0.9 % IV SOLN
INTRAVENOUS | Status: DC
  Administered 2016-11-13: 20 mL/h via INTRAVENOUS

## 2016-11-13 NOTE — ED Notes (Signed)
Patient getting dressed but shoes not in room. Called MRI and her sandals are still down there and they will be bringing shoes in about 10 mins.

## 2016-11-13 NOTE — Care Management (Signed)
ED CM spoke with patient in [facility] patient was up and walking in the room without assistance. Patient is ambulatory and independent, CM discussed transitional care planning with Dr. Freida Busman he states, patient doesn't have a HH need, patient has be up and walking the halls in the ED. CM signing off no CM needs identified.

## 2016-11-13 NOTE — ED Notes (Signed)
Dr Freida Busman made aware patient requesting pain medication.   Dr Freida Busman states that he had conversation with patient already regarding pain and not ordering her pain medications.

## 2016-11-13 NOTE — ED Triage Notes (Signed)
Pt states that she has been dizzy for two days and has had confusion and expressive aphasia per husband. States she has fallen because of the dizziness and has hit her shoulder. Recent hx of surgery on her neck and shoulder. Alert and oriented at this time.

## 2016-11-13 NOTE — ED Notes (Signed)
MRI on phone stating they will be coming for patient in about 15 minutes.  Made patient aware and confirmed patient is going to stay for MRI due to her being dressed and stating she is leaving.   Patient confirmed she will stay for MRI.

## 2016-11-13 NOTE — ED Notes (Signed)
Patient transported to MRI 

## 2016-11-13 NOTE — ED Notes (Signed)
Dr Freida Busman at bedside talking to patient.

## 2016-11-13 NOTE — ED Provider Notes (Signed)
[facility]-EMERGENCY DEPT Provider Note   CSN: [id number] Arrival date & time: 11/13/16  4098     History   Chief Complaint Chief Complaint  Patient presents with  . Dizziness  . Aphasia    HPI Kara [name] is a 61 y.o. female.  61 year old female presents with several day history of intermittent dizziness and confusion with expressive aphasia. Patient's symptoms wax and wane and has been feels as if they're due to increased stress. She has been seen here twice in the past 48 hours and then having issues raised about pain management. Patient states that when she tries to walk she feels off balance and she has fallen twice.patient states she has trouble trying to get the words out.she has had periods of confusion. Denies any use of alcohol or illicit drugs. Denies any severe headache, neck pain, vomiting. No abdominal or chest injuries from her fall. Denies any new medications. No urinary symptoms. No double vision. No current weakness in arms or legs.      Past Medical History:  Diagnosis Date  . ADD (attention deficit disorder)    on Adderal  . Aggressive behavior of adult   . Allergy   . Anxiety   . Arthritis   . Asthma    as a child  . Cellulitis of breast 11/2013   RIGHT BREAST  . 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 Chronic kidney disease    due to infection from stone- no regular visits- fine now  . Chronic pain    goes to [facility]  . Cold sore   . 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 Depression    takes meds daily  . Family history of malignant neoplasm of breast   . Fibromyalgia   . Nephrolithiasis   . Osteoporosis   . Other specified rheumatoid arthritis, right shoulder (HCC) 08/01/2011  . Post-nasal drip    hx of  . Rheumatoid arthritis(714.0)    autoimmune arthritis; methotrexate once/week  . Urinary incontinence     Patient Active Problem List   Diagnosis Date  Noted  . Essential hypertension 07/26/2016  . Acne 07/26/2016  . DNR (do not resuscitate) discussion   . Palliative care by specialist   . Post-operative infection 07/06/2016  . SIRS (systemic inflammatory response syndrome) (HCC) 07/05/2016  . Wound infection 07/05/2016  . Cervical pseudoarthrosis (HCC) 06/21/2016  . Opioid dependence in remission (HCC) 06/14/2016  . Mitral valve prolapse 01/04/2016  . Skin infection 12/22/2015  . HNP (herniated nucleus pulposus), lumbar 12/10/2015  . Prediabetes 08/19/2015  . BMI 26.0-26.9,adult 08/18/2015  . Chronic pain 08/18/2015  . Vitamin D deficiency 08/18/2015  . Chronic fatigue disorder 06/14/2015  . S/P cervical spinal fusion 03/15/2015  . PTSD (post-traumatic stress disorder) 03/07/2014  . Severe recurrent major depression without psychotic features (HCC) 03/06/2014  . Suicide threat or attempt 03/06/2014  . Aggressive behavior   . Family history of malignant neoplasm of breast   . Rheumatoid arthritis (HCC) 12/13/2011  . Fibromyalgia 12/13/2011  . H/O cold sores 12/13/2011    Past Surgical History:  Procedure Laterality Date  . ANTERIOR CERVICAL DECOMP/DISCECTOMY FUSION N/A 03/15/2015   Procedure: Cervical five-six, Cerival six-seven, Anterior Cervical Discectomy and Fusion, Allograft and Plate;  Surgeon: Eldred Manges, MD;  Location: [facility] OR;  Service: Orthopedics;  Laterality: N/A;  . BACK SURGERY  2011,16   lower back fusion l4-5, s1  .  BLADDER SURGERY  2009  . BREAST ENHANCEMENT SURGERY  2003  . breast implants removed    . BREAST SURGERY  10/2013   REMOVAL OF BREAST IMPLANTS  . CERVICAL WOUND DEBRIDEMENT N/A 07/07/2016   Procedure: IRRIGATION AND DEBRIDEMENT POSTERIOR NECK;  Surgeon: Eldred Manges, MD;  Location: [facility] OR;  Service: Orthopedics;  Laterality: N/A;  . INCISION AND DRAINAGE ABSCESS Right 01/16/2014   Procedure: INCISION AND DRAINAGE AND OF RIGHT BREAST ABCESS;  Surgeon: Glenna Fellows, MD;  Location: [facility];   Service: General;  Laterality: Right;  . JOINT REPLACEMENT    . LIPOSUCTION  2003   abdominal  . LUMBAR LAMINECTOMY/DECOMPRESSION MICRODISCECTOMY N/A 12/10/2015   Procedure: Right L3-4 Hemilaminectomy, Excision of herniated nucleus pulposus;  Surgeon: Eldred Manges, MD;  Location: [facility] OR;  Service: Orthopedics;  Laterality: N/A;  . MASS EXCISION  11/03/2011   Procedure: MINOR EXCISION OF MASS;  Surgeon: Wyn Forster., MD;  Location: [facility];  Service: Orthopedics;  Laterality: Left;  debride IP joint, cyst excision left index  . POSTERIOR CERVICAL FUSION/FORAMINOTOMY N/A 06/21/2016   Procedure: POSTERIOR CERVICAL FUSION C5-C7 SPINOUS PROCESS WIRING;  Surgeon: Eldred Manges, MD;  Location: [facility] OR;  Service: Orthopedics;  Laterality: N/A;  . POSTERIOR FUSION CERVICAL SPINE  06/21/2016   C5 C7  . SHOULDER SURGERY  2012   right  . TONSILLECTOMY  1987  . TOTAL SHOULDER ARTHROPLASTY  08/01/2011   Procedure: TOTAL SHOULDER ARTHROPLASTY;  Surgeon: Eulas Post, MD;  Location: [facility] OR;  Service: Orthopedics;  Laterality: Right;  Right total shoulder arthroplasty  . TUBAL LIGATION  1988    OB History    No data available       Home Medications    Prior to Admission medications   Medication Sig Start Date End Date Taking? Authorizing Provider  ADDERALL XR 30 MG 24 hr capsule Take 60 mg by mouth daily. Takes 2 capsules in the morning 11/02/15   [provider]  ALPRAZolam Prudy Feeler) 1 MG tablet Take 1 mg by mouth 3 (three) times daily as needed for anxiety.  07/06/14   [provider]  amphetamine-dextroamphetamine (ADDERALL) 30 MG tablet Take 30 mg by mouth daily. Before 4 pm 04/29/16   [provider]  cephALEXin (KEFLEX) 500 MG capsule Take 2 capsules (1,000 mg total) by mouth 2 (two) times daily. Patient not taking: Reported on 10/24/2016 08/22/16   Ginnie Smart, MD  cyclobenzaprine (FLEXERIL) 10 MG tablet Take 1 tablet (10 mg total) by mouth 3  (three) times daily as needed for muscle spasms. Patient not taking: Reported on 10/24/2016 08/23/16   Eldred Manges, MD  diclofenac (VOLTAREN) 75 MG EC tablet Take 1 tablet (75 mg total) by mouth 2 (two) times daily. 05/02/16   Eldred Manges, MD  Diclofenac Sodium (PENNSAID) 2 % SOLN Place 2 g onto the skin 4 (four) times daily as needed. Patient taking differently: Place 2 g onto the skin 2 (two) times daily as needed (joint pain).  02/24/16   Tarry Kos, MD  fluticasone (FLONASE) 50 MCG/ACT nasal spray Place 2 sprays into both nostrils daily. 11/12/16   Kirichenko, Lemont Fillers, PA-C  HYDROcodone-acetaminophen (NORCO/VICODIN) 5-325 MG tablet TAKE 1 TABLET BY MOUTH EVERY 6 HOURS AS NEEDED FOR dental PAIN 10/23/16   [provider]  lisinopril (PRINIVIL,ZESTRIL) 10 MG tablet Take 1 tablet (10 mg total) by mouth daily. 08/16/16   Kuneff, Renee A, DO  meloxicam (  MOBIC) 15 MG tablet TAKE 1 TABLET BY MOUTH EVERY DAY 10/27/16   Naida Sleight, PA-C  methocarbamol (ROBAXIN) 500 MG tablet Take 1 tablet (500 mg total) by mouth 4 (four) times daily. Patient not taking: Reported on 08/22/2016 06/22/16   Eldred Manges, MD  potassium chloride SA (K-DUR,KLOR-CON) 20 MEQ tablet Take 3 tablets (60 mEq total) by mouth once. 08/09/16 08/09/16  Kuppelweiser, Cassie L, RPH  rifampin (RIFADIN) 300 MG capsule Take 1 capsule (300 mg total) by mouth daily. Patient not taking: Reported on 10/24/2016 08/22/16   Ginnie Smart, MD  tretinoin (RETIN-A) 0.1 % cream Apply topically at bedtime. 07/25/16   Kuneff, Renee A, DO  valACYclovir (VALTREX) 1000 MG tablet Take two tablets onset of coldsore and repeat 2 tablets in 12 hours 08/16/16   Kuneff, Renee A, DO  venlafaxine XR (EFFEXOR-XR) 75 MG 24 hr capsule Take 75 mg by mouth daily with breakfast. 37.5 mg for total of 112.5mg     [provider]  zolpidem (AMBIEN) 10 MG tablet TAKE 1 TABLET (10 mg) BY MOUTH AT BEDTIME AS NEEDED for sleep 05/23/15   [provider]     Family History Family History  Problem Relation Age of Onset  . Arthritis Mother   . Heart disease Mother        ?psvt  . Breast cancer Mother 60       TAH/BSO  . Cancer Mother   . Mental illness Mother   . COPD Father   . Hypertension Father   . Alcohol abuse Father   . Mental illness Father   . Heart disease Father   . Healthy Daughter   . Breast cancer Maternal Aunt 27       deceased  . Cancer Cousin 86       female; unknown primary  . Colon cancer Paternal Aunt 45       deceased at 77  . Stomach cancer Paternal Uncle 28       deceased at 26  . Alcohol abuse Brother   . Cancer Brother   . Hodgkin's lymphoma Brother   . Mental illness Brother   . HIV Brother   . Healthy Brother   . Healthy Son     Social History Social History  Substance Use Topics  . Smoking status: Former Smoker    Packs/day: 0.50    Years: 10.00    Types: Cigarettes    Quit date: 12/06/2005  . Smokeless tobacco: Never Used     Comment: smoked for 10 years  . Alcohol use No     Comment: denied alcohol     Allergies   Aspirin; Ibuprofen; Ativan [lorazepam]; Ketamine; Toradol [ketorolac tromethamine]; and Tramadol hcl   Review of Systems Review of Systems  All other systems reviewed and are negative.    Physical Exam Updated Vital Signs BP (!) 159/98 (BP Location: Left Arm)   Pulse 77   Temp 98.4 F (36.9 C) (Oral)   Resp 16   SpO2 95%   Physical Exam  Constitutional: She is oriented to person, place, and time. She appears well-developed and well-nourished.  Non-toxic appearance. No distress.  HENT:  Head: Normocephalic and atraumatic.  Eyes: Pupils are equal, round, and reactive to light. Conjunctivae, EOM and lids are normal.  Neck: Normal range of motion. Neck supple. No tracheal deviation present. No thyroid mass present.  Cardiovascular: Normal rate, regular rhythm and normal heart sounds.  Exam reveals no gallop.   No  murmur heard. Pulmonary/Chest: Effort normal  and breath sounds normal. No stridor. No respiratory distress. She has no decreased breath sounds. She has no wheezes. She has no rhonchi. She has no rales.  Abdominal: Soft. Normal appearance and bowel sounds are normal. She exhibits no distension. There is no tenderness. There is no rebound and no CVA tenderness.  Musculoskeletal: Normal range of motion. She exhibits no edema or tenderness.  Neurological: She is alert and oriented to person, place, and time. She has normal strength. No cranial nerve deficit or sensory deficit. GCS eye subscore is 4. GCS verbal subscore is 5. GCS motor subscore is 6.  Skin: Skin is warm and dry. No abrasion and no rash noted.  Psychiatric: She has a normal mood and affect. Her speech is normal and behavior is normal.  Nursing note and vitals reviewed.    ED Treatments / Results  Labs (all labs ordered are listed, but only abnormal results are displayed) Labs Reviewed  URINALYSIS, ROUTINE W REFLEX MICROSCOPIC  CBC WITH DIFFERENTIAL/PLATELET  COMPREHENSIVE METABOLIC PANEL    EKG  EKG Interpretation None       Radiology Dg Chest 2 View  Result Date: 11/12/2016 CLINICAL DATA:  Chest pain, weakness for 1 day. EXAM: CHEST  2 VIEW COMPARISON:  Chest radiograph July 05, 2016 FINDINGS: Cardiomediastinal silhouette is normal. No pleural effusions or focal consolidations. Trachea projects midline and there is no pneumothorax. Soft tissue planes and included osseous structures are non-suspicious. RIGHT shoulder arthroplasty. ACDF and posterior cerclage wires. IMPRESSION: Negative chest. Electronically Signed   By: Awilda Metro M.D.   On: 11/12/2016 01:15    Procedures Procedures (including critical care time)  Medications Ordered in ED Medications  0.9 %  sodium chloride infusion (not administered)     Initial Impression / Assessment and Plan / ED Course  I have reviewed the triage vital signs and the nursing notes.  Pertinent labs & imaging  results that were available during my care of the patient were reviewed by me and considered in my medical decision making (see chart for details).     While patient was in the department she became very boisterous and loud. Threatening and demanding pain medication. Concern for drug-seeking behavior. She was however medicated with low-dose fentanyl as well as Benadryl and Haldol. Her MRI of her brain came back negative. I had discussion with her and her husband about the need for follow-up with their primary care doctor to discuss outpatient pain management.  Final Clinical Impressions(s) / ED Diagnoses   Final diagnoses:  None    New Prescriptions New Prescriptions   No medications on file     Lorre Nick, MD 11/13/16 1454

## 2016-11-13 NOTE — ED Notes (Signed)
Patient removed IV herself and getting dressed yelling, "I am leaving!" Patient upset that she isnt getting medications for pain. EXplained to patient that Dr has ordered her some medications but I'll have to get another IV on her to given them since she pulled out her other IV access.   Patient demanding to speak with EDp again.  Made Dr Freida Busman aware patient is dressed, pulled out her IV and wanting to leave.  DR gone to room to speak with patient.

## 2016-11-13 NOTE — ED Notes (Signed)
Patient still walking in and out of room. Patient will not stay in room. Patient standing at the nurses station. Patient yelling out at staff. EDP aware of the situation.

## 2016-11-13 NOTE — ED Notes (Signed)
Patient ambulating in hallway.  Patient states, "Is there not another doctor here that can order me something for pain?"   Informed patient that Dr Freida Busman is the EDP taking care of her today.  Patient asking for pain medications again.  When asked patient what medications help her pain the best, responds "I need Dilaudid 2mg , because I have bars in my back, my arm is not real and look (holding arms and hands out at nurse) at my thumbs."  Informed patient I would make EDP aware of her request.

## 2016-11-14 ENCOUNTER — Encounter (INDEPENDENT_AMBULATORY_CARE_PROVIDER_SITE_OTHER): Payer: Self-pay | Admitting: Orthopaedic Surgery

## 2016-11-14 ENCOUNTER — Ambulatory Visit (INDEPENDENT_AMBULATORY_CARE_PROVIDER_SITE_OTHER): Payer: Managed Care, Other (non HMO) | Admitting: Orthopaedic Surgery

## 2016-11-14 VITALS — BP 127/80 | HR 85 | Ht 64.0 in | Wt 155.0 lb

## 2016-11-14 DIAGNOSIS — Z981 Arthrodesis status: Secondary | ICD-10-CM | POA: Diagnosis not present

## 2016-11-14 MED ORDER — OXYCODONE-ACETAMINOPHEN 5-325 MG PO TABS: 1.0000 | ORAL_TABLET | ORAL | 0 refills | Status: DC | PRN

## 2016-11-14 MED ORDER — PREDNISONE 5 MG PO TABS: 5.0000 mg | ORAL_TABLET | Freq: Every day | ORAL | 0 refills | Status: DC

## 2016-11-14 NOTE — Progress Notes (Signed)
Office Visit Note   Patient: Kara [name]           Date of Birth: [birth year]           MRN: [id number] Visit Date: 11/14/2016              Requested by: Natalia Leatherwood, DO [address] [place], [place] [address] PCP: Natalia Leatherwood, DO   Assessment & Plan: Visit Diagnoses:  1. Status post lumbar spinal fusion     Plan: We discussed walking program continuing her depression medication. Prescription for 15 tablets of Norco 5/325 given with no refills.  Long discussion once again about pain medication narcotic usage, past history of pain clinics, opioid dependence in remission and her other medication usage.  Follow-Up Instructions: Return if symptoms worsen or fail to improve.   Orders:  No orders of the defined types were placed in this encounter.  Meds ordered this encounter  Medications  . DISCONTD: predniSONE (DELTASONE) 5 MG tablet    Sig: Take 1 tablet (5 mg total) by mouth daily with breakfast.    Dispense:  21 tablet    Refill:  0  . DISCONTD: oxyCODONE-acetaminophen (PERCOCET/ROXICET) 5-325 MG tablet    Sig: Take 1-2 tablets by mouth every 4 (four) hours as needed for severe pain.    Dispense:  20 tablet    Refill:  0      Procedures: No procedures performed   Clinical Data: No additional findings.   Subjective: Chief Complaint  Patient presents with  . Neck - Pain  . Right Ankle - Pain  . Left Shoulder - Pain  . Right Shoulder - Pain  . Left Knee - Pain  . Right Knee - Pain    HPI patient returns today she's having an increased flare of pain she's been crying tearful. She has depression mixed with mood swings that are similar to her bipolar disorder. She had past history of opioid dependency in remission. She is taking anti-inflammatories and not gotten relief. Patient was emergency room yesterday had normal MRI scan of the brain.  Review of Systems review updated in unchanged from last office visit otherwise as mentioned in history of present  illness.   Objective: Vital Signs: BP 127/80   Pulse 85   Ht 5\' 4"  (1.626 m)   Wt 155 lb (70.3 kg)   BMI 26.61 kg/m   Physical Exam  Constitutional: She is oriented to person, place, and time. She appears well-developed.  HENT:  Head: Normocephalic.  Right Ear: External ear normal.  Left Ear: External ear normal.  Eyes: Pupils are equal, round, and reactive to light.  Neck: No tracheal deviation present. No thyromegaly present.  Cardiovascular: Normal rate.   Pulmonary/Chest: Effort normal.  Abdominal: Soft.  Musculoskeletal:  Anterior tib EHL gastrocsoleus a strong lumbar incisions well-healed. No pain with hip range of motion distal pulses are intact negative Homans.  Neurological: She is alert and oriented to person, place, and time.  Skin: Skin is warm and dry.  Psychiatric: She has a normal mood and affect. Her behavior is normal.    Ortho Exam  Specialty Comments:  No specialty comments available.  Imaging: No results found.   PMFS History: Patient Active Problem List   Diagnosis Date Noted  . Essential hypertension 07/26/2016  . Acne 07/26/2016  . DNR (do not resuscitate) discussion   . Palliative care by specialist   . Cervical pseudoarthrosis (HCC) 06/21/2016  . Opioid dependence in remission (  HCC) 06/14/2016  . Mitral valve prolapse 01/04/2016  . HNP (herniated nucleus pulposus), lumbar 12/10/2015  . Prediabetes 08/19/2015  . BMI 26.0-26.9,adult 08/18/2015  . Chronic pain 08/18/2015  . Vitamin D deficiency 08/18/2015  . Chronic fatigue disorder 06/14/2015  . S/P cervical spinal fusion 03/15/2015  . PTSD (post-traumatic stress disorder) 03/07/2014  . Severe recurrent major depression without psychotic features (HCC) 03/06/2014  . Suicide threat or attempt 03/06/2014  . Aggressive behavior   . Family history of malignant neoplasm of breast   . Rheumatoid arthritis (HCC) 12/13/2011  . Fibromyalgia 12/13/2011  . H/O cold sores 12/13/2011   Past  Medical History:  Diagnosis Date  . ADD (attention deficit disorder)    on Adderal  . Aggressive behavior of adult   . Allergy   . Anxiety   . Arthritis   . Asthma    as a child  . Cellulitis of breast 11/2013   RIGHT BREAST  . 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 Chronic kidney disease    due to infection from stone- no regular visits- fine now  . Chronic pain    goes to [facility]  . Cold sore   . 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 Depression    takes meds daily  . Family history of malignant neoplasm of breast   . Fibromyalgia   . Nephrolithiasis   . Osteoporosis   . Other specified rheumatoid arthritis, right shoulder (HCC) 08/01/2011  . Post-nasal drip    hx of  . Rheumatoid arthritis(714.0)    autoimmune arthritis; methotrexate once/week  . Urinary incontinence     Family History  Problem Relation Age of Onset  . Arthritis Mother   . Heart disease Mother        ?psvt  . Breast cancer Mother 69       TAH/BSO  . Cancer Mother   . Mental illness Mother   . COPD Father   . Hypertension Father   . Alcohol abuse Father   . Mental illness Father   . Heart disease Father   . Healthy Daughter   . Breast cancer Maternal Aunt 27       deceased  . Cancer Cousin 58       female; unknown primary  . Colon cancer Paternal Aunt 109       deceased at 64  . Stomach cancer Paternal Uncle 13       deceased at 54  . Alcohol abuse Brother   . Cancer Brother   . Hodgkin's lymphoma Brother   . Mental illness Brother   . HIV Brother   . Healthy Brother   . Healthy Son     Past Surgical History:  Procedure Laterality Date  . ANTERIOR CERVICAL DECOMP/DISCECTOMY FUSION N/A 03/15/2015   Procedure: Cervical five-six, Cerival six-seven, Anterior Cervical Discectomy and Fusion, Allograft and Plate;  Surgeon: Eldred Manges, MD;  Location: [facility] OR;  Service: Orthopedics;  Laterality: N/A;  .  BACK SURGERY  2011,16   lower back fusion l4-5, s1  . BLADDER SURGERY  2009  . BREAST ENHANCEMENT SURGERY  2003  . breast implants removed    . BREAST SURGERY  10/2013   REMOVAL OF BREAST IMPLANTS  . CERVICAL WOUND DEBRIDEMENT N/A 07/07/2016   Procedure: IRRIGATION AND DEBRIDEMENT POSTERIOR NECK;  Surgeon: Eldred Manges, MD;  Location: [facility] OR;  Service: Orthopedics;  Laterality: N/A;  . INCISION AND DRAINAGE ABSCESS Right 01/16/2014   Procedure: INCISION AND DRAINAGE AND OF RIGHT BREAST ABCESS;  Surgeon: Glenna Fellows, MD;  Location: [facility];  Service: General;  Laterality: Right;  . JOINT REPLACEMENT    . LIPOSUCTION  2003   abdominal  . LUMBAR LAMINECTOMY/DECOMPRESSION MICRODISCECTOMY N/A 12/10/2015   Procedure: Right L3-4 Hemilaminectomy, Excision of herniated nucleus pulposus;  Surgeon: Eldred Manges, MD;  Location: [facility] OR;  Service: Orthopedics;  Laterality: N/A;  . MASS EXCISION  11/03/2011   Procedure: MINOR EXCISION OF MASS;  Surgeon: Wyn Forster., MD;  Location: [facility];  Service: Orthopedics;  Laterality: Left;  debride IP joint, cyst excision left index  . POSTERIOR CERVICAL FUSION/FORAMINOTOMY N/A 06/21/2016   Procedure: POSTERIOR CERVICAL FUSION C5-C7 SPINOUS PROCESS WIRING;  Surgeon: Eldred Manges, MD;  Location: [facility];  Service: Orthopedics;  Laterality: N/A;  . POSTERIOR FUSION CERVICAL SPINE  06/21/2016   C5 C7  . SHOULDER SURGERY  2012   right  . TONSILLECTOMY  1987  . TOTAL SHOULDER ARTHROPLASTY  08/01/2011   Procedure: TOTAL SHOULDER ARTHROPLASTY;  Surgeon: Eulas Post, MD;  Location: [facility] OR;  Service: Orthopedics;  Laterality: Right;  Right total shoulder arthroplasty  . TUBAL LIGATION  1988   Social History   Occupational History  . Not on file.   Social History Main Topics  . Smoking status: Former Smoker    Packs/day: 0.50    Years: 10.00    Types: Cigarettes    Quit date: 12/06/2005  . Smokeless tobacco: Never Used     Comment:  smoked for 10 years  . Alcohol use No     Comment: denied alcohol  . Drug use: No     Comment: denied any drug use with admission nurse  . Sexual activity: Yes    Birth control/ protection: Post-menopausal

## 2016-11-21 ENCOUNTER — Other Ambulatory Visit (INDEPENDENT_AMBULATORY_CARE_PROVIDER_SITE_OTHER): Payer: Self-pay | Admitting: Orthopaedic Surgery

## 2016-11-21 ENCOUNTER — Ambulatory Visit (INDEPENDENT_AMBULATORY_CARE_PROVIDER_SITE_OTHER): Payer: Managed Care, Other (non HMO) | Admitting: Family Medicine

## 2016-11-21 ENCOUNTER — Encounter: Payer: Self-pay | Admitting: Family Medicine

## 2016-11-21 VITALS — BP 134/89 | HR 77 | Temp 98.5°F | Resp 20 | Wt 160.5 lb

## 2016-11-21 DIAGNOSIS — F332 Major depressive disorder, recurrent severe without psychotic features: Secondary | ICD-10-CM | POA: Diagnosis not present

## 2016-11-21 DIAGNOSIS — M797 Fibromyalgia: Secondary | ICD-10-CM | POA: Diagnosis not present

## 2016-11-21 DIAGNOSIS — G9332 Myalgic encephalomyelitis/chronic fatigue syndrome: Secondary | ICD-10-CM

## 2016-11-21 DIAGNOSIS — R5382 Chronic fatigue, unspecified: Secondary | ICD-10-CM | POA: Diagnosis not present

## 2016-11-21 DIAGNOSIS — E559 Vitamin D deficiency, unspecified: Secondary | ICD-10-CM | POA: Diagnosis not present

## 2016-11-21 DIAGNOSIS — M069 Rheumatoid arthritis, unspecified: Secondary | ICD-10-CM | POA: Diagnosis not present

## 2016-11-21 LAB — CBC AND DIFFERENTIAL
HCT: 40 (ref 36–46)
HEMOGLOBIN: 13.1 (ref 12.0–16.0)
Neutrophils Absolute: 6
Platelets: 398 (ref 150–399)
WBC: 9.5

## 2016-11-21 LAB — BASIC METABOLIC PANEL
BUN: 23 — AB (ref 4–21)
CREATININE: 0.8 (ref 0.5–1.1)
Glucose: 95
POTASSIUM: 4.3 (ref 3.4–5.3)
Sodium: 138 (ref 137–147)

## 2016-11-21 LAB — HEPATIC FUNCTION PANEL
ALK PHOS: 119 (ref 25–125)
ALT: 17 (ref 7–35)
AST: 17 (ref 13–35)
Bilirubin, Direct: 0.2 (ref 0.01–0.4)

## 2016-11-21 MED ORDER — CYCLOBENZAPRINE HCL 10 MG PO TABS: 10.0000 mg | ORAL_TABLET | Freq: Two times a day (BID) | ORAL | 1 refills | Status: DC | PRN

## 2016-11-21 NOTE — Telephone Encounter (Signed)
Another doc just gave her 180 tabs. She does not need it from [place].

## 2016-11-21 NOTE — Progress Notes (Signed)
Kara [name] , 1956-01-23, 61 y.o., female MRN: [id number] Patient Care Team    Relationship Specialty Notifications Start End  Natalia Leatherwood, DO PCP - General Family Medicine  06/14/15   Eldred Manges, MD Consulting Physician Orthopedic Surgery  06/14/15   Milagros Evener, MD Consulting Physician Psychiatry  06/14/15     Chief Complaint  Patient presents with  . Pain    generalized   . Fatigue     Subjective: Pt presents for an OV with complaints of Generalized pain, fatigue, "arthritis flare ". She has been seen in the emergency room 3 times over the last week with similar complaints. Patient states today that she is not looking for any narcotic prescription, or pain management referral. She just wants to be more comfortable. She has seen her rheumatologist today who has completed lab work, and is planning to start a new immune modulator for her rheumatoid arthritis. She also has a history of fibromyalgia, and feels this is the main room of her pain. She reports she has discomfort in her lumbar spine, bilateral shoulders, hands. She is wondering if her B-12 and vitamin D levels are playing a role in her discomfort, since she has been low and these in the past. She is not supplementing these currently. Patient states he is compliant with her Effexor 150 mg daily, meloxicam 15 mg daily. Patient has a significant medical history for rheumatoid arthritis, severe to a depression, vitamin D deficiency, fibromyalgia, chronic fatigue syndrome, as well as multiple musculoskeletal complaints and surgeries with recent cervical spine fusion.  Patient states that she noticed that her fibromyalgia flared after she came back from her trip and had dental work completed secondary to infection. She states she was placed on a narcotic medication at that time short-term.  Depression screen [facility] An 2/9 08/22/2016 08/18/2015  Decreased Interest 2 0  Down, Depressed, Hopeless 2 0  PHQ - 2 Score 4 0  Altered sleeping  1 -  Tired, decreased energy 2 -  Change in appetite 1 -  Feeling bad or failure about yourself  2 -  Trouble concentrating 1 -  Moving slowly or fidgety/restless 1 -  Suicidal thoughts 0 -  PHQ-9 Score 12 -  Difficult doing work/chores Somewhat difficult -  Some recent data might be hidden    Allergies  Allergen Reactions  . Aspirin Anaphylaxis  . Ibuprofen Anaphylaxis  . Ativan [Lorazepam] Other (See Comments)    Makes agitated, combative   . Ketamine Other (See Comments)    Hallucinations  . Toradol [Ketorolac Tromethamine] Itching, Swelling and Rash    UNSPECIFIED REACTION   . Tramadol Hcl Itching, Swelling and Rash    SWELLING REACTION UNSPECIFIED  Tolerates Dilaudid 06/2016.  TDD.   Social History  Substance Use Topics  . Smoking status: Former Smoker    Packs/day: 0.50    Years: 10.00    Types: Cigarettes    Quit date: 12/06/2005  . Smokeless tobacco: Never Used     Comment: smoked for 10 years  . Alcohol use No     Comment: denied alcohol   Past Medical History:  Diagnosis Date  . ADD (attention deficit disorder)    on Adderal  . Aggressive behavior of adult   . Allergy   . Anxiety   . Arthritis   . Asthma    as a child  . Cellulitis of breast 11/2013   RIGHT BREAST  . 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  Chronic kidney disease    due to infection from stone- no regular visits- fine now  . Chronic pain    goes to [facility] for pain control  . Cold sore   . 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 Depression    takes meds daily  . Family history of malignant neoplasm of breast   . Fibromyalgia   . Nephrolithiasis   . Osteoporosis   . Other specified rheumatoid arthritis, right shoulder (HCC) 08/01/2011  . Post-nasal drip    hx of  . Rheumatoid arthritis(714.0)    autoimmune arthritis; methotrexate once/week  . Urinary incontinence    Past Surgical History:  Procedure  Laterality Date  . ANTERIOR CERVICAL DECOMP/DISCECTOMY FUSION N/A 03/15/2015   Procedure: Cervical five-six, Cerival six-seven, Anterior Cervical Discectomy and Fusion, Allograft and Plate;  Surgeon: Eldred Manges, MD;  Location: [facility] OR;  Service: Orthopedics;  Laterality: N/A;  . BACK SURGERY  2011,16   lower back fusion l4-5, s1  . BLADDER SURGERY  2009  . BREAST ENHANCEMENT SURGERY  2003  . breast implants removed    . BREAST SURGERY  10/2013   REMOVAL OF BREAST IMPLANTS  . CERVICAL WOUND DEBRIDEMENT N/A 07/07/2016   Procedure: IRRIGATION AND DEBRIDEMENT POSTERIOR NECK;  Surgeon: Eldred Manges, MD;  Location: [facility] OR;  Service: Orthopedics;  Laterality: N/A;  . INCISION AND DRAINAGE ABSCESS Right 01/16/2014   Procedure: INCISION AND DRAINAGE AND OF RIGHT BREAST ABCESS;  Surgeon: Glenna Fellows, MD;  Location: [facility] ORS;  Service: General;  Laterality: Right;  . JOINT REPLACEMENT    . LIPOSUCTION  2003   abdominal  . LUMBAR LAMINECTOMY/DECOMPRESSION MICRODISCECTOMY N/A 12/10/2015   Procedure: Right L3-4 Hemilaminectomy, Excision of herniated nucleus pulposus;  Surgeon: Eldred Manges, MD;  Location: [facility] OR;  Service: Orthopedics;  Laterality: N/A;  . MASS EXCISION  11/03/2011   Procedure: MINOR EXCISION OF MASS;  Surgeon: Wyn Forster., MD;  Location: [facility];  Service: Orthopedics;  Laterality: Left;  debride IP joint, cyst excision left index  . POSTERIOR CERVICAL FUSION/FORAMINOTOMY N/A 06/21/2016   Procedure: POSTERIOR CERVICAL FUSION C5-C7 SPINOUS PROCESS WIRING;  Surgeon: Eldred Manges, MD;  Location: [facility] OR;  Service: Orthopedics;  Laterality: N/A;  . POSTERIOR FUSION CERVICAL SPINE  06/21/2016   C5 C7  . SHOULDER SURGERY  2012   right  . TONSILLECTOMY  1987  . TOTAL SHOULDER ARTHROPLASTY  08/01/2011   Procedure: TOTAL SHOULDER ARTHROPLASTY;  Surgeon: Eulas Post, MD;  Location: [facility] OR;  Service: Orthopedics;  Laterality: Right;  Right total shoulder arthroplasty  .  TUBAL LIGATION  1988   Family History  Problem Relation Age of Onset  . Arthritis Mother   . Heart disease Mother        ?psvt  . Breast cancer Mother 50       TAH/BSO  . Cancer Mother   . Mental illness Mother   . COPD Father   . Hypertension Father   . Alcohol abuse Father   . Mental illness Father   . Heart disease Father   . Healthy Daughter   . Breast cancer Maternal Aunt 27       deceased  . Cancer Cousin 57       female; unknown primary  . Colon cancer Paternal Aunt 52       deceased at 61  . Stomach cancer Paternal Uncle  90       deceased at 25  . Alcohol abuse Brother   . Cancer Brother   . Hodgkin's lymphoma Brother   . Mental illness Brother   . HIV Brother   . Healthy Brother   . Healthy Son    Allergies as of 11/21/2016      Reactions   Aspirin Anaphylaxis   Ibuprofen Anaphylaxis   Ativan [lorazepam] Other (See Comments)   Makes agitated, combative   Ketamine Other (See Comments)   Hallucinations   Toradol [ketorolac Tromethamine] Itching, Swelling, Rash   UNSPECIFIED REACTION    Tramadol Hcl Itching, Swelling, Rash   SWELLING REACTION UNSPECIFIED  Tolerates Dilaudid 06/2016.  TDD.      Medication List       Accurate as of 11/21/16  3:38 PM. Always use your most recent med list.          ADDERALL XR 30 MG 24 hr capsule Generic drug:  amphetamine-dextroamphetamine Take 60 mg by mouth daily.   amphetamine-dextroamphetamine 30 MG tablet Commonly known as:  ADDERALL Take 30 mg by mouth every evening. Before 4 pm   ALPRAZolam 1 MG tablet Commonly known as:  XANAX Take 1 mg by mouth 3 (three) times daily as needed for anxiety.   cyclobenzaprine 10 MG tablet Commonly known as:  FLEXERIL Take 1 tablet (10 mg total) by mouth 3 (three) times daily as needed for muscle spasms.   diclofenac 75 MG EC tablet Commonly known as:  VOLTAREN Take 1 tablet (75 mg total) by mouth 2 (two) times daily.   Diclofenac Sodium 2 % Soln Commonly known as:   PENNSAID Place 2 g onto the skin 4 (four) times daily as needed.   lisinopril 10 MG tablet Commonly known as:  PRINIVIL,ZESTRIL Take 1 tablet (10 mg total) by mouth daily.   meloxicam 15 MG tablet Commonly known as:  MOBIC TAKE 1 TABLET BY MOUTH EVERY DAY   oxyCODONE-acetaminophen 5-325 MG tablet Commonly known as:  PERCOCET/ROXICET Take 1-2 tablets by mouth every 4 (four) hours as needed for severe pain.   tretinoin 0.1 % cream Commonly known as:  RETIN-A Apply topically at bedtime.   valACYclovir 1000 MG tablet Commonly known as:  VALTREX Take two tablets onset of coldsore and repeat 2 tablets in 12 hours   venlafaxine XR 75 MG 24 hr capsule Commonly known as:  EFFEXOR-XR Take 75 mg by mouth 2 (two) times daily.   zolpidem 10 MG tablet Commonly known as:  AMBIEN TAKE 1 TABLET (10 mg) BY MOUTH AT BEDTIME AS NEEDED for sleep       All past medical history, surgical history, allergies, family history, immunizations andmedications were updated in the EMR today and reviewed under the history and medication portions of their EMR.     ROS: Negative, with the exception of above mentioned in HPI   Objective:  BP 134/89 (BP Location: Left Arm, Patient Position: Sitting, Cuff Size: Normal)   Pulse 77   Temp 98.5 F (36.9 C)   Resp 20   Wt 160 lb 8 oz (72.8 kg)   SpO2 98%   BMI 27.55 kg/m  Body mass index is 27.55 kg/m. Gen: Afebrile. No acute distress. Nontoxic in appearance, well developed, well nourished. Does not appear uncomfortable. HENT: AT. [place].  MMM, no oral lesions.  Eyes:Pupils Equal Round Reactive to light, Extraocular movements intact,  Conjunctiva without redness, discharge or icterus. CV: RRR, no edema Chest: CTAB, no wheeze or crackles. Good  air movement, normal resp effort.  MSK: Right thumb deformity in the distal and proximal joint, left thumb deformity in the proximal joint. Nodules of multiple finger joints. Full range of motion of bilateral upper and  lower extremities. Skin: no rashes, purpura or petechiae.  Neuro: Normal gait. PERLA. EOMi. Alert. Oriented x3  Psych: Normal affect, dress and demeanor. Normal speech. Normal thought content and judgment.  No exam data present No results found. No results found for this or any previous visit (from the past 24 hour(s)).  Assessment/Plan: Amiera Herzberg is a 61 y.o. female present for OV for  Rheumatoid arthritis involving both hands, unspecified rheumatoid factor presence (HCC) - Managed by rheumatology, had appointment today. Severe recurrent major depression without psychotic features (HCC) - Managed by psychiatry Vitamin D deficiency - Retest levels today's, will supplement if needed. - Vitamin D (25 hydroxy) Chronic fatigue disorder/Fibromyalgia - Patient has been without Flexeril for a few weeks, discussed Flexeril is one of the medications that has shown benefit fibromyalgia patients. I refilled this medication for her today and encouraged her to use it nightly for the next few weeks, and then she may use 3 times a day when necessary. She reports she is being started on any medication through her psychiatrist as well, [facility] which will also have coverage for fibromyalgia. She has seen her rheumatologist today, and it sounds like they are going to try her on a new medication to help control the pain with her rheumatoid arthritis. - Discussed with her today I do not think narcotic medications or in her best interest. 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 Patient understood and agreed. - B12 - patient was encouraged to take daily vitamin B-12.   Reviewed expectations re: course of current medical issues.  Discussed self-management of symptoms.  Outlined signs and symptoms indicating need for more acute intervention.  Patient verbalized understanding and all questions were answered.  Patient received an  After-Visit Summary.    No orders of the defined types were placed in this encounter.    Note is dictated utilizing voice recognition software. Although note has been proof read prior to signing, occasional typographical errors still can be missed. If any questions arise, please do not hesitate to call for verification.   electronically signed by:  Felix Pacini, DO  [facility] - OR

## 2016-11-21 NOTE — Patient Instructions (Signed)
I have called in flexeril for you to take. Flexeril is one of the only medications proven to help with fibromyalgia pain.  The medication  Prescribed by the other doctor today for fibromyalgia is also good and these can be taken together.     Myofascial Pain Syndrome and Fibromyalgia Myofascial pain syndrome and fibromyalgia are both pain disorders. This pain may be felt mainly in your muscles.  Myofascial pain syndrome: ? Always has trigger points or tender points in the muscle that will cause pain when pressed. The pain may come and go. ? Usually affects your neck, upper back, and shoulder areas. The pain often radiates into your arms and hands.  Fibromyalgia: ? Has muscle pains and tenderness that come and go. ? Is often associated with fatigue and sleep disturbances. ? Has trigger points. ? Tends to be long-lasting (chronic), but is not life-threatening.  Fibromyalgia and myofascial pain are not the same. However, they often occur together. If you have both conditions, each can make the other worse. Both are common and can cause enough pain and fatigue to make day-to-day activities difficult. What are the causes? The exact causes of fibromyalgia and myofascial pain are not known. People with certain gene types may be more likely to develop fibromyalgia. Some factors can be triggers for both conditions, such as:  Spine disorders.  Arthritis.  Severe injury (trauma) and other physical stressors.  Being under a lot of stress.  A medical illness.  What are the signs or symptoms? Fibromyalgia The main symptom of fibromyalgia is widespread pain and tenderness in your muscles. This can vary over time. Pain is sometimes described as stabbing, shooting, or burning. You may have tingling or numbness, too. You may also have sleep problems and fatigue. You may wake up feeling tired and groggy (fibro fog). Other symptoms may include:  Bowel and bladder problems.  Headaches.  Visual  problems.  Problems with odors and noises.  Depression or mood changes.  Painful menstrual periods (dysmenorrhea).  Dry skin or eyes.  Myofascial pain syndrome Symptoms of myofascial pain syndrome include:  Tight, ropy bands of muscle.  Uncomfortable sensations in muscular areas, such as: ? Aching. ? Cramping. ? Burning. ? Numbness. ? Tingling. ? Muscle weakness.  Trouble moving certain muscles freely (range of motion).  How is this diagnosed? There are no specific tests to diagnose fibromyalgia or myofascial pain syndrome. Both can be hard to diagnose because their symptoms are common in many other conditions. Your health care provider may suspect one or both of these conditions based on your symptoms and medical history. Your health care provider will also do a physical exam. The key to diagnosing fibromyalgia is having pain, fatigue, and other symptoms for more than three months that cannot be explained by another condition. The key to diagnosing myofascial pain syndrome is finding trigger points in muscles that are tender and cause pain elsewhere in your body (referred pain). How is this treated? Treating fibromyalgia and myofascial pain often requires a team of health care providers. This usually starts with your primary provider and a physical therapist. You may also find it helpful to work with alternative health care providers, such as massage therapists or acupuncturists. Treatment for fibromyalgia may include medicines. This may include nonsteroidal anti-inflammatory drugs (NSAIDs), along with other medicines. Treatment for myofascial pain may also include:  NSAIDs.  Cooling and stretching of muscles.  Trigger point injections.  Sound wave (ultrasound) treatments to stimulate muscles.  Follow these instructions at  home:  Take medicines only as directed by your health care provider.  Exercise as directed by your health care provider or physical therapist.  Try  to avoid stressful situations.  Practice relaxation techniques to control your stress. You may want to try: ? Biofeedback. ? Visual imagery. ? Hypnosis. ? Muscle relaxation. ? Yoga. ? Meditation.  Talk to your health care provider about alternative treatments, such as acupuncture or massage treatment.  Maintain a healthy lifestyle. This includes eating a healthy diet and getting enough sleep.  Consider joining a support group.  Do not do activities that stress or strain your muscles. That includes repetitive motions and heavy lifting. Where to find more information:  National Fibromyalgia Association: www.fmaware.org  Arthritis Foundation: www.arthritis.org  American Chronic Pain Association: GumSearch.nl Contact a health care provider if:  You have new symptoms.  Your symptoms get worse.  You have side effects from your medicines.  You have trouble sleeping.  Your condition is causing depression or anxiety. This information is not intended to replace advice given to you by your health care provider. Make sure you discuss any questions you have with your health care provider. Document Released: 03/13/2005 Document Revised: 08/19/2015 Document Reviewed: 12/17/2013 Elsevier Interactive Patient Education  [employer].

## 2016-11-21 NOTE — Telephone Encounter (Signed)
Please advise 

## 2016-11-22 ENCOUNTER — Telehealth: Payer: Self-pay | Admitting: Family Medicine

## 2016-11-22 ENCOUNTER — Encounter: Payer: Self-pay | Admitting: *Deleted

## 2016-11-22 LAB — VITAMIN D 25 HYDROXY (VIT D DEFICIENCY, FRACTURES): VIT D 25 HYDROXY: 26 ng/mL — AB (ref 30–100)

## 2016-11-22 LAB — VITAMIN B12: VITAMIN B 12: 365 pg/mL (ref 200–1100)

## 2016-11-22 NOTE — Telephone Encounter (Signed)
Patient notified and verbalized understanding.  Patient stated that her Flexeril prescription did not go through to pharmacy.  Pharmacy called and verbal given over the phone for medication as prescribed in chart.

## 2016-11-22 NOTE — Telephone Encounter (Signed)
I called patient. She states pharmacy made a mistake sending refill request to [place] and that her PCP is filling it.  Request denied.

## 2016-11-22 NOTE — Telephone Encounter (Signed)
Please call pt: - her labs indicate she has mild vit. D deficiency. Her vit B12 is normal, but lower end normal.  - I would suggest she start daily vit d 600-800 u daily with food and a daily OTC b12 supplement ( ).

## 2016-11-28 ENCOUNTER — Encounter: Payer: Self-pay | Admitting: Family Medicine

## 2016-12-16 ENCOUNTER — Other Ambulatory Visit (INDEPENDENT_AMBULATORY_CARE_PROVIDER_SITE_OTHER): Payer: Self-pay | Admitting: Orthopaedic Surgery

## 2016-12-18 NOTE — Telephone Encounter (Signed)
Please advise 

## 2016-12-25 ENCOUNTER — Encounter (INDEPENDENT_AMBULATORY_CARE_PROVIDER_SITE_OTHER): Payer: Self-pay | Admitting: Orthopaedic Surgery

## 2016-12-26 ENCOUNTER — Ambulatory Visit (INDEPENDENT_AMBULATORY_CARE_PROVIDER_SITE_OTHER): Payer: Managed Care, Other (non HMO) | Admitting: Orthopaedic Surgery

## 2016-12-29 ENCOUNTER — Ambulatory Visit (INDEPENDENT_AMBULATORY_CARE_PROVIDER_SITE_OTHER): Payer: Managed Care, Other (non HMO) | Admitting: Orthopaedic Surgery

## 2016-12-29 ENCOUNTER — Encounter (INDEPENDENT_AMBULATORY_CARE_PROVIDER_SITE_OTHER): Payer: Self-pay | Admitting: Orthopaedic Surgery

## 2016-12-29 VITALS — BP 139/86 | HR 89 | Ht 64.0 in | Wt 162.0 lb

## 2016-12-29 DIAGNOSIS — Z981 Arthrodesis status: Secondary | ICD-10-CM | POA: Diagnosis not present

## 2016-12-29 DIAGNOSIS — G8929 Other chronic pain: Secondary | ICD-10-CM | POA: Diagnosis not present

## 2016-12-29 DIAGNOSIS — M545 Low back pain: Secondary | ICD-10-CM

## 2016-12-29 NOTE — Progress Notes (Signed)
Office Visit Note   Patient: Kara [name]           Date of Birth: 1956/03/09           MRN: [id number] Visit Date: 12/29/2016              Requested by: Natalia Leatherwood, DO [address] [place], [place] [address] PCP: Natalia Leatherwood, DO   Assessment & Plan: Visit Diagnoses:  1. Chronic right-sided low back pain, with sciatica presence unspecified   2. S/P lumbar spinal fusion          fusion L4-S1. Right distal extrusion with moderate stenosis at L3-4 above or solid fusion. Chronic history of the prescription pain medication usage, off and on.  Plan: Patient requested referral to Dr.Ibazedo was injected her previously. Will send her for a L3-4 right epidural and office follow-up here will be when necessary.  Follow-Up Instructions: No Follow-up on file.   Orders:  Orders Placed This Encounter  Procedures  . Ambulatory referral to Physical Medicine Rehab   No orders of the defined types were placed in this encounter.     Procedures: No procedures performed   Clinical Data: No additional findings.   Subjective: Chief Complaint  Patient presents with  . Lower Back - Pain  . Right Hip - Pain    HPI  61 year old female returns states she is continuing to have problems with recent flare of back pain and right hip pain. Started about one month ago and seems to be getting worse. Long history of pain medication and pain clinic patient. She recently was on Humira which was making her tired and she has been switched to [place] is hoping this will help her rheumatoid arthritis symptoms. Recent brain MRI in August due to altered mental status. Patient's fusion L4-S1 with instrumentation and interbody fusion. She has central and right disc protrusion/extrusion at L3-4 with moderate stenosis present. This is above or solid fusion.  Review of Systems   Objective: Vital Signs: BP 139/86   Pulse 89   Ht 5\' 4"  (1.626 m)   Wt 162 lb (73.5 kg)   BMI 27.81 kg/m   Physical Exam    Constitutional: She is oriented to person, place, and time. She appears well-developed.  HENT:  Head: Normocephalic.  Right Ear: External ear normal.  Left Ear: External ear normal.  Eyes: Pupils are equal, round, and reactive to light.  Neck: No tracheal deviation present. No thyromegaly present.  Cardiovascular: Normal rate.   Pulmonary/Chest: Effort normal.  Abdominal: Soft.  Neurological: She is alert and oriented to person, place, and time.  Skin: Skin is warm and dry.  Psychiatric: She has a normal mood and affect. Her behavior is normal.    Ortho Exam well-healed anterior posterior incision from cervical fusion. Posterior surgical incision is widened slightly from postoperative infection that required a VAC and then successfully healed. No brachial plexus tenderness good cervical range of motion. Good hip range of motion right and left without discomfort. Negative straight leg raising 90. Ankle jerk reflexes are symmetrical. Negative Faber test. Lumbar incisions well-healed.  Specialty Comments:  No specialty comments available.  Imaging: No results found.   PMFS History: Patient Active Problem List   Diagnosis Date Noted  . S/P lumbar spinal fusion 01/01/2017  . Essential hypertension 07/26/2016  . Acne 07/26/2016  . DNR (do not resuscitate) discussion   . Palliative care by specialist   . Cervical pseudoarthrosis (HCC) 06/21/2016  . Opioid  dependence in remission (HCC) 06/14/2016  . Mitral valve prolapse 01/04/2016  . HNP (herniated nucleus pulposus), lumbar 12/10/2015  . Prediabetes 08/19/2015  . BMI 26.0-26.9,adult 08/18/2015  . Chronic pain 08/18/2015  . Vitamin D deficiency 08/18/2015  . Chronic fatigue disorder 06/14/2015  . S/P cervical spinal fusion 03/15/2015  . PTSD (post-traumatic stress disorder) 03/07/2014  . Severe recurrent major depression without psychotic features (HCC) 03/06/2014  . Suicide threat or attempt 03/06/2014  . Aggressive behavior    . Family history of malignant neoplasm of breast   . Rheumatoid arthritis (HCC) 12/13/2011  . Fibromyalgia 12/13/2011  . H/O cold sores 12/13/2011   Past Medical History:  Diagnosis Date  . ADD (attention deficit disorder)    on Adderal  . Aggressive behavior of adult   . Allergy   . Anxiety   . Arthritis   . Asthma    as a child  . Cellulitis of breast 11/2013   RIGHT BREAST  . 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 Chronic kidney disease    due to infection from stone- no regular visits- fine now  . Chronic pain    [facility] to [facility]  . Cold sore   . 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 Depression    takes meds daily  . Family history of malignant neoplasm of breast   . Fibromyalgia   . Nephrolithiasis   . Osteoporosis   . Other specified rheumatoid arthritis, right shoulder (HCC) 08/01/2011  . Post-nasal drip    hx of  . Rheumatoid arthritis(714.0)    autoimmune arthritis; methotrexate once/week  . Urinary incontinence     Family History  Problem Relation Age of Onset  . Arthritis Mother   . Heart disease Mother        ?psvt  . Breast cancer Mother 54       TAH/BSO  . Cancer Mother   . Mental illness Mother   . COPD Father   . Hypertension Father   . Alcohol abuse Father   . Mental illness Father   . Heart disease Father   . Healthy Daughter   . Breast cancer Maternal Aunt 27       deceased  . Cancer Cousin 27       female; unknown primary  . Colon cancer Paternal Aunt 30       deceased at 63  . Stomach cancer Paternal Uncle 66       deceased at 67  . Alcohol abuse Brother   . Cancer Brother   . Hodgkin's lymphoma Brother   . Mental illness Brother   . HIV Brother   . Healthy Brother   . Healthy Son     Past Surgical History:  Procedure Laterality Date  . ANTERIOR CERVICAL DECOMP/DISCECTOMY FUSION N/A 03/15/2015   Procedure: Cervical five-six, Cerival  six-seven, Anterior Cervical Discectomy and Fusion, Allograft and Plate;  Surgeon: Eldred Manges, MD;  Location: [facility];  Service: Orthopedics;  Laterality: N/A;  . BACK SURGERY  2011,16   lower back fusion l4-5, s1  . BLADDER SURGERY  2009  . BREAST ENHANCEMENT SURGERY  2003  . breast implants removed    . BREAST SURGERY  10/2013   REMOVAL OF BREAST IMPLANTS  . CERVICAL WOUND DEBRIDEMENT N/A 07/07/2016   Procedure: IRRIGATION AND DEBRIDEMENT POSTERIOR NECK;  Surgeon: Eldred Manges, MD;  Location: [facility] OR;  Service: Orthopedics;  Laterality: N/A;  . INCISION AND DRAINAGE ABSCESS Right 01/16/2014   Procedure: INCISION AND DRAINAGE AND OF RIGHT BREAST ABCESS;  Surgeon: Glenna Fellows, MD;  Location: [facility];  Service: General;  Laterality: Right;  . JOINT REPLACEMENT    . LIPOSUCTION  2003   abdominal  . LUMBAR LAMINECTOMY/DECOMPRESSION MICRODISCECTOMY N/A 12/10/2015   Procedure: Right L3-4 Hemilaminectomy, Excision of herniated nucleus pulposus;  Surgeon: Eldred Manges, MD;  Location: [facility] OR;  Service: Orthopedics;  Laterality: N/A;  . MASS EXCISION  11/03/2011   Procedure: MINOR EXCISION OF MASS;  Surgeon: Wyn Forster., MD;  Location: [facility];  Service: Orthopedics;  Laterality: Left;  debride IP joint, cyst excision left index  . POSTERIOR CERVICAL FUSION/FORAMINOTOMY N/A 06/21/2016   Procedure: POSTERIOR CERVICAL FUSION C5-C7 SPINOUS PROCESS WIRING;  Surgeon: Eldred Manges, MD;  Location: [facility];  Service: Orthopedics;  Laterality: N/A;  . POSTERIOR FUSION CERVICAL SPINE  06/21/2016   C5 C7  . SHOULDER SURGERY  2012   right  . TONSILLECTOMY  1987  . TOTAL SHOULDER ARTHROPLASTY  08/01/2011   Procedure: TOTAL SHOULDER ARTHROPLASTY;  Surgeon: Eulas Post, MD;  Location: [facility] OR;  Service: Orthopedics;  Laterality: Right;  Right total shoulder arthroplasty  . TUBAL LIGATION  1988   Social History   Occupational History  . Not on file.   Social History Main Topics  .  Smoking status: Former Smoker    Packs/day: 0.50    Years: 10.00    Types: Cigarettes    Quit date: 12/06/2005  . Smokeless tobacco: Never Used     Comment: smoked for 10 years  . Alcohol use No     Comment: denied alcohol  . Drug use: No     Comment: denied any drug use with admission nurse  . Sexual activity: Yes    Birth control/ protection: Post-menopausal

## 2017-01-01 ENCOUNTER — Other Ambulatory Visit (INDEPENDENT_AMBULATORY_CARE_PROVIDER_SITE_OTHER): Payer: Self-pay | Admitting: Surgery

## 2017-01-01 DIAGNOSIS — Z981 Arthrodesis status: Secondary | ICD-10-CM | POA: Insufficient documentation

## 2017-01-01 NOTE — Telephone Encounter (Signed)
meloxicam Refill request

## 2017-01-09 ENCOUNTER — Ambulatory Visit: Payer: Managed Care, Other (non HMO) | Admitting: Cardiovascular Disease

## 2017-01-10 ENCOUNTER — Encounter (INDEPENDENT_AMBULATORY_CARE_PROVIDER_SITE_OTHER): Payer: Self-pay | Admitting: Orthopaedic Surgery

## 2017-01-14 ENCOUNTER — Other Ambulatory Visit (INDEPENDENT_AMBULATORY_CARE_PROVIDER_SITE_OTHER): Payer: Self-pay | Admitting: Orthopaedic Surgery

## 2017-01-15 NOTE — Telephone Encounter (Signed)
Ok thanks 

## 2017-01-15 NOTE — Telephone Encounter (Signed)
Ok for refill? 

## 2017-01-16 ENCOUNTER — Telehealth (INDEPENDENT_AMBULATORY_CARE_PROVIDER_SITE_OTHER): Payer: Self-pay | Admitting: Orthopaedic Surgery

## 2017-01-16 NOTE — Telephone Encounter (Signed)
Please advise 

## 2017-01-16 NOTE — Telephone Encounter (Signed)
[name],Kara 10/04/1955   Pt wanted to know if she could get her pain meds before her visit on 01/31/17 @10 :45am * pt stated if we have any cancellations please let her know.

## 2017-01-17 NOTE — Telephone Encounter (Signed)
SORRY

## 2017-01-17 NOTE — Telephone Encounter (Signed)
I left voicemail advising. No pain meds at this time. She will need to keep appt to be evaluated in office.

## 2017-01-23 ENCOUNTER — Ambulatory Visit (INDEPENDENT_AMBULATORY_CARE_PROVIDER_SITE_OTHER): Payer: Managed Care, Other (non HMO) | Admitting: Orthopaedic Surgery

## 2017-01-23 ENCOUNTER — Encounter (INDEPENDENT_AMBULATORY_CARE_PROVIDER_SITE_OTHER): Payer: Self-pay | Admitting: Orthopaedic Surgery

## 2017-01-23 VITALS — BP 117/80 | HR 96 | Ht 64.0 in | Wt 164.0 lb

## 2017-01-23 DIAGNOSIS — M7061 Trochanteric bursitis, right hip: Secondary | ICD-10-CM

## 2017-01-23 MED ORDER — BUPIVACAINE HCL 0.25 % IJ SOLN
2.0000 mL | INTRAMUSCULAR | Status: AC | PRN
  Administered 2017-01-23: 2 mL via INTRA_ARTICULAR

## 2017-01-23 MED ORDER — LIDOCAINE HCL 1 % IJ SOLN
0.5000 mL | INTRAMUSCULAR | Status: AC | PRN
  Administered 2017-01-23: .5 mL

## 2017-01-23 MED ORDER — METHYLPREDNISOLONE ACETATE 40 MG/ML IJ SUSP
40.0000 mg | INTRAMUSCULAR | Status: AC | PRN
  Administered 2017-01-23: 40 mg via INTRA_ARTICULAR

## 2017-01-23 NOTE — Progress Notes (Signed)
Office Visit Note   Patient: Kara [name]           Date of Birth: 1955/07/22           MRN: [id number] Visit Date: 01/23/2017              Requested by: Natalia Leatherwood, DO [address] [place], [place] [address] PCP: Natalia Leatherwood, DO   Assessment & Plan: Visit Diagnoses:  1. Trochanteric bursitis, right hip     Plan: Injection performed. Chest narcotic pain medication and I advised against it with her past history including polypharmacy, and past history of opioid dependence.  Follow-Up Instructions: Return if symptoms worsen or fail to improve.   Orders:  No orders of the defined types were placed in this encounter.  No orders of the defined types were placed in this encounter.     Procedures: Large Joint Inj Date/Time: 01/23/2017 4:55 PM Performed by: Eldred Manges Authorized by: Annell Greening C   Location:  Hip Site:  L greater trochanter Needle Size:  22 G Ultrasound Guidance: No   Fluoroscopic Guidance: No   Arthrogram: No   Medications:  0.5 mL lidocaine 1 %; 2 mL bupivacaine 0.25 %; 40 mg methylPREDNISolone acetate 40 MG/ML Aspiration Attempted: No       Clinical Data: No additional findings.   Subjective: Chief Complaint  Patient presents with  . Right Hip - Pain    HPI patient returned she states she had an injection by Dr.Ibazabo and states her leg feels much better her only problem is she is having significant right lateral trochanteric pain. She states it bothers her when she gets sitting standing or gets them on the floor. She states she wants either an injection her pain medication. We have previously discussed with her extensively avoiding narcotics with her past history less absolutely necessary for short-term use. Posterior cervical incision is completely healed. There is prominence of C7 spinous process which is immediately underneath the skin. She states she does not have any neck pain. She relates she was on Keflex one daily this was helping  her rheumatoid arthritis and she is going to talk with her rheumatologist about this. She denies chills or fever. Lumbar incision is well-healed. She is here with her husband today.  Review of Systems  reviewed updated and unchanged from last loss of office visit of than as mentioned in history of present illness.  Objective: Vital Signs: BP 117/80   Pulse 96   Ht 5\' 4"  (1.626 m)   Wt 164 lb (74.4 kg)   BMI 28.15 kg/m   Physical Exam  Constitutional: She is oriented to person, place, and time. She appears well-developed.  HENT:  Head: Normocephalic.  Right Ear: External ear normal.  Left Ear: External ear normal.  Eyes: Pupils are equal, round, and reactive to light.  Neck: No tracheal deviation present. No thyromegaly present.  Cardiovascular: Normal rate.   Pulmonary/Chest: Effort normal.  Abdominal: Soft.  Neurological: She is alert and oriented to person, place, and time.  Skin: Skin is warm and dry.  Psychiatric: She has a normal mood and affect. Her behavior is normal.    Ortho Exam patient has no pain with hip range of motion. Showed 2 sets of x-rays last 18 months of her hip which were normal. Sensation is intact reflexes are intact anterior tib EHL is intact. Negative Trendelenburg gait. No tenderness the opposite left trochanter. No sciatic notch tenderness in the right hip  is normal. Lumbar incision is well-healed.  Specialty Comments:  No specialty comments available.  Imaging: No results found.   PMFS History: Patient Active Problem List   Diagnosis Date Noted  . S/P lumbar spinal fusion 01/01/2017  . Essential hypertension 07/26/2016  . Acne 07/26/2016  . DNR (do not resuscitate) discussion   . Palliative care by specialist   . Cervical pseudoarthrosis (HCC) 06/21/2016  . Opioid dependence in remission (HCC) 06/14/2016  . Mitral valve prolapse 01/04/2016  . HNP (herniated nucleus pulposus), lumbar 12/10/2015  . Prediabetes 08/19/2015  . BMI  26.0-26.9,adult 08/18/2015  . Chronic pain 08/18/2015  . Vitamin D deficiency 08/18/2015  . Chronic fatigue disorder 06/14/2015  . S/P cervical spinal fusion 03/15/2015  . PTSD (post-traumatic stress disorder) 03/07/2014  . Severe recurrent major depression without psychotic features (HCC) 03/06/2014  . Suicide threat or attempt 03/06/2014  . Aggressive behavior   . Family history of malignant neoplasm of breast   . Rheumatoid arthritis (HCC) 12/13/2011  . Fibromyalgia 12/13/2011  . H/O cold sores 12/13/2011   Past Medical History:  Diagnosis Date  . ADD (attention deficit disorder)    on Adderal  . Aggressive behavior of adult   . Allergy   . Anxiety   . Arthritis   . Asthma    as a child  . Cellulitis of breast 11/2013   RIGHT BREAST  . 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 Chronic kidney disease    due to infection from stone- no regular visits- fine now  . Chronic pain    [facility]es to [facility]  . Cold sore   . 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 Depression    takes meds daily  . Family history of malignant neoplasm of breast   . Fibromyalgia   . Nephrolithiasis   . Osteoporosis   . Other specified rheumatoid arthritis, right shoulder (HCC) 08/01/2011  . Post-nasal drip    hx of  . Rheumatoid arthritis(714.0)    autoimmune arthritis; methotrexate once/week  . Urinary incontinence     Family History  Problem Relation Age of Onset  . Arthritis Mother   . Heart disease Mother        ?psvt  . Breast cancer Mother 27       TAH/BSO  . Cancer Mother   . Mental illness Mother   . COPD Father   . Hypertension Father   . Alcohol abuse Father   . Mental illness Father   . Heart disease Father   . Healthy Daughter   . Breast cancer Maternal Aunt 27       deceased  . Cancer Cousin 37       female; unknown primary  . Colon cancer Paternal Aunt 24       deceased at 51  .  Stomach cancer Paternal Uncle 63       deceased at 48  . Alcohol abuse Brother   . Cancer Brother   . Hodgkin's lymphoma Brother   . Mental illness Brother   . HIV Brother   . Healthy Brother   . Healthy Son     Past Surgical History:  Procedure Laterality Date  . ANTERIOR CERVICAL DECOMP/DISCECTOMY FUSION N/A 03/15/2015   Procedure: Cervical five-six, Cerival six-seven, Anterior Cervical Discectomy and Fusion, Allograft and Plate;  Surgeon: Eldred Manges, MD;  Location: [facility];  Service: Orthopedics;  Laterality: N/A;  . BACK SURGERY  2011,16   lower back fusion l4-5, s1  . BLADDER SURGERY  2009  . BREAST ENHANCEMENT SURGERY  2003  . breast implants removed    . BREAST SURGERY  10/2013   REMOVAL OF BREAST IMPLANTS  . CERVICAL WOUND DEBRIDEMENT N/A 07/07/2016   Procedure: IRRIGATION AND DEBRIDEMENT POSTERIOR NECK;  Surgeon: Eldred Manges, MD;  Location: [facility];  Service: Orthopedics;  Laterality: N/A;  . INCISION AND DRAINAGE ABSCESS Right 01/16/2014   Procedure: INCISION AND DRAINAGE AND OF RIGHT BREAST ABCESS;  Surgeon: Glenna Fellows, MD;  Location: [facility];  Service: General;  Laterality: Right;  . JOINT REPLACEMENT    . LIPOSUCTION  2003   abdominal  . LUMBAR LAMINECTOMY/DECOMPRESSION MICRODISCECTOMY N/A 12/10/2015   Procedure: Right L3-4 Hemilaminectomy, Excision of herniated nucleus pulposus;  Surgeon: Eldred Manges, MD;  Location[facility] OR;  Service: Orthopedics;  Laterality: N/A;  . MASS EXCISION  11/03/2011   Procedure: MINOR EXCISION OF MASS;  Surgeon: Wyn Forster., MD;  Location:  [facility]UR[facility]ERY [facility];  Service: Orthopedics;  Laterality: Left;  debride IP joint, cyst excision left index  . POSTERIOR CERVICAL FUSION/FORAMINOTOMY N/A 06/21/2016   Procedure: POSTERIOR CERVICAL FUSION C5-C7 SPINOUS PROCESS WIRING;  Surgeon: Eldred Manges, MD;  Location: [facility];  Service: Orthopedics;  Laterality: N/A;  . POSTERIOR FUSION CERVICAL SPINE  06/21/2016   C5 C7  . SHOULDER SURGERY   2012   right  . TONSILLECTOMY  1987  . TOTAL SHOULDER ARTHROPLASTY  08/01/2011   Procedure: TOTAL SHOULDER ARTHROPLASTY;  Surgeon: Eulas Post, MD;  Location: [facility] OR;  Service: Orthopedics;  Laterality: Right;  Right total shoulder arthroplasty  . TUBAL LIGATION  1988   Social History   Occupational History  . Not on file.   Social History Main Topics  . Smoking status: Former Smoker    Packs/day: 0.50    Years: 10.00    Types: Cigarettes    Quit date: 12/06/2005  . Smokeless tobacco: Never Used     Comment: smoked for 10 years  . Alcohol use No     Comment: denied alcohol  . Drug use: No     Comment: denied any drug use with admission nurse  . Sexual activity: Yes    Birth control/ protection: Post-menopausal

## 2017-01-24 ENCOUNTER — Telehealth (INDEPENDENT_AMBULATORY_CARE_PROVIDER_SITE_OTHER): Payer: Self-pay | Admitting: Orthopaedic Surgery

## 2017-01-24 NOTE — Telephone Encounter (Signed)
Please advise 

## 2017-01-24 NOTE — Telephone Encounter (Signed)
Rosali, Augello  1955/08/16   Pt had injection yesterday and she stated she is still in pain.Pt wanted to know if she could receive pain meds for her right hip.

## 2017-01-25 NOTE — Telephone Encounter (Signed)
Sorry. Need to avoid narcotics .

## 2017-01-26 ENCOUNTER — Ambulatory Visit (INDEPENDENT_AMBULATORY_CARE_PROVIDER_SITE_OTHER): Payer: Managed Care, Other (non HMO) | Admitting: Family Medicine

## 2017-01-26 ENCOUNTER — Encounter: Payer: Self-pay | Admitting: Family Medicine

## 2017-01-26 VITALS — BP 135/89 | HR 87 | Temp 99.1°F | Resp 20 | Wt 162.8 lb

## 2017-01-26 DIAGNOSIS — M069 Rheumatoid arthritis, unspecified: Secondary | ICD-10-CM | POA: Diagnosis not present

## 2017-01-26 DIAGNOSIS — R350 Frequency of micturition: Secondary | ICD-10-CM

## 2017-01-26 DIAGNOSIS — M797 Fibromyalgia: Secondary | ICD-10-CM

## 2017-01-26 DIAGNOSIS — S129XXD Fracture of neck, unspecified, subsequent encounter: Secondary | ICD-10-CM

## 2017-01-26 DIAGNOSIS — R5382 Chronic fatigue, unspecified: Secondary | ICD-10-CM | POA: Diagnosis not present

## 2017-01-26 DIAGNOSIS — G9332 Myalgic encephalomyelitis/chronic fatigue syndrome: Secondary | ICD-10-CM

## 2017-01-26 DIAGNOSIS — M5126 Other intervertebral disc displacement, lumbar region: Secondary | ICD-10-CM

## 2017-01-26 DIAGNOSIS — G894 Chronic pain syndrome: Secondary | ICD-10-CM

## 2017-01-26 LAB — POC URINALSYSI DIPSTICK (AUTOMATED)
Bilirubin, UA: NEGATIVE
Glucose, UA: NEGATIVE
KETONES UA: NEGATIVE
LEUKOCYTES UA: NEGATIVE
NITRITE UA: NEGATIVE
Protein, UA: NEGATIVE
Spec Grav, UA: >=1.03 — AB (ref 1.010–1.025)
Urobilinogen, UA: 0.2 E.U./dL
pH, UA: 5.5 (ref 5.0–8.0)

## 2017-01-26 MED ORDER — VALACYCLOVIR HCL 1 G PO TABS: ORAL_TABLET | ORAL | 11 refills | Status: DC

## 2017-01-26 MED ORDER — CEPHALEXIN 500 MG PO CAPS: 500.0000 mg | ORAL_CAPSULE | Freq: Three times a day (TID) | ORAL | 0 refills | Status: DC

## 2017-01-26 NOTE — Progress Notes (Signed)
Kara [name] , [birth year], 61 y.o., female MRN: [id number] Patient Care Team    Relationship Specialty Notifications Start End  Natalia Leatherwood, DO PCP - General Family Medicine  06/14/15   Eldred Manges, MD Consulting Physician Orthopedic Surgery  06/14/15   Milagros Evener, MD Consulting Physician Psychiatry  06/14/15     Chief Complaint  Patient presents with  . Urinary Frequency    x 3 days      Subjective: Pt presents for an OV with complaints of urinary frequency of 3 days duration. Patient denies fever, chills, nausea, vomit, hematuria. She reports she started Keflex that she had left over from last time and her symptoms stayed the same. She reports she's only taken about 1 Keflex a day since she's not certain if the urine studies will show anything. She then asks about her rheumatoid arthritis, sulfa medications minocycline and pain medications. she reports she seen her orthopedic yesterday to try another injection, and wants to see how that works before prescribing any other pain medicine. She then states her rheumatologist about town for the next 2 weeks, and her RA has been worsening.   Depression screen [facility] 2/9 08/22/2016 08/18/2015  Decreased Interest 2 0  Down, Depressed, Hopeless 2 0  PHQ - 2 Score 4 0  Altered sleeping 1 -  Tired, decreased energy 2 -  Change in appetite 1 -  Feeling bad or failure about yourself  2 -  Trouble concentrating 1 -  Moving slowly or fidgety/restless 1 -  Suicidal thoughts 0 -  PHQ-9 Score 12 -  Difficult doing work/chores Somewhat difficult -  Some recent data might be hidden    Allergies  Allergen Reactions  . Aspirin Anaphylaxis  . Ibuprofen Anaphylaxis  . Ativan [Lorazepam] Other (See Comments)    Makes agitated, combative   . Ketamine Other (See Comments)    Hallucinations  . Toradol [Ketorolac Tromethamine] Itching, Swelling and Rash    UNSPECIFIED REACTION   . Tramadol Hcl Itching, Swelling and Rash    SWELLING REACTION  UNSPECIFIED  Tolerates Dilaudid 06/2016.  TDD.   Social History  Substance Use Topics  . Smoking status: Former Smoker    Packs/day: 0.50    Years: 10.00    Types: Cigarettes    Quit date: 12/06/2005  . Smokeless tobacco: Never Used     Comment: smoked for 10 years  . Alcohol use No     Comment: denied alcohol   Past Medical History:  Diagnosis Date  . ADD (attention deficit disorder)    on Adderal  . Aggressive behavior of adult   . Allergy   . Anxiety   . Arthritis   . Asthma    as a child  . Cellulitis of breast 11/2013   RIGHT BREAST  . 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 Chronic kidney disease    due to infection from stone- no regular visits- fine now  . Chronic pain    [facility] to [facility]  . Cold sore   . 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 Depression    takes meds daily  . Family history of malignant neoplasm of breast   . Fibromyalgia   . Nephrolithiasis   . Osteoporosis   . Other specified rheumatoid arthritis, right shoulder (HCC) 08/01/2011  . Post-nasal drip    hx of  . Rheumatoid arthritis(714.0)  autoimmune arthritis; methotrexate once/week  . Urinary incontinence    Past Surgical History:  Procedure Laterality Date  . ANTERIOR CERVICAL DECOMP/DISCECTOMY FUSION N/A 03/15/2015   Procedure: Cervical five-six, Cerival six-seven, Anterior Cervical Discectomy and Fusion, Allograft and Plate;  Surgeon: Eldred Manges, MD;  Location: [facility] OR;  Service: Orthopedics;  Laterality: N/A;  . BACK SURGERY  2011,16   lower back fusion l4-5, s1  . BLADDER SURGERY  2009  . BREAST ENHANCEMENT SURGERY  2003  . breast implants removed    . BREAST SURGERY  10/2013   REMOVAL OF BREAST IMPLANTS  . CERVICAL WOUND DEBRIDEMENT N/A 07/07/2016   Procedure: IRRIGATION AND DEBRIDEMENT POSTERIOR NECK;  Surgeon: Eldred Manges, MD;  Location: [facility] OR;  Service: Orthopedics;  Laterality: N/A;  .  INCISION AND DRAINAGE ABSCESS Right 01/16/2014   Procedure: INCISION AND DRAINAGE AND OF RIGHT BREAST ABCESS;  Surgeon: Glenna Fellows, MD;  Location: [facility] ORS;  Service: General;  Laterality: Right;  . JOINT REPLACEMENT    . LIPOSUCTION  2003   abdominal  . LUMBAR LAMINECTOMY/DECOMPRESSION MICRODISCECTOMY N/A 12/10/2015   Procedure: Right L3-4 Hemilaminectomy, Excision of herniated nucleus pulposus;  Surgeon: Eldred Manges, MD;  Location: [facility] OR;  Service: Orthopedics;  Laterality: N/A;  . MASS EXCISION  11/03/2011   Procedure: MINOR EXCISION OF MASS;  Surgeon: Wyn Forster., MD;  Location: [facility];  Service: Orthopedics;  Laterality: Left;  debride IP joint, cyst excision left index  . POSTERIOR CERVICAL FUSION/FORAMINOTOMY N/A 06/21/2016   Procedure: POSTERIOR CERVICAL FUSION C5-C7 SPINOUS PROCESS WIRING;  Surgeon: Eldred Manges, MD;  Location: [facility] OR;  Service: Orthopedics;  Laterality: N/A;  . POSTERIOR FUSION CERVICAL SPINE  06/21/2016   C5 C7  . SHOULDER SURGERY  2012   right  . TONSILLECTOMY  1987  . TOTAL SHOULDER ARTHROPLASTY  08/01/2011   Procedure: TOTAL SHOULDER ARTHROPLASTY;  Surgeon: Eulas Post, MD;  Location: [facility] OR;  Service: Orthopedics;  Laterality: Right;  Right total shoulder arthroplasty  . TUBAL LIGATION  1988   Family History  Problem Relation Age of Onset  . Arthritis Mother   . Heart disease Mother        ?psvt  . Breast cancer Mother 74       TAH/BSO  . Cancer Mother   . Mental illness Mother   . COPD Father   . Hypertension Father   . Alcohol abuse Father   . Mental illness Father   . Heart disease Father   . Healthy Daughter   . Breast cancer Maternal Aunt 27       deceased  . Cancer Cousin 17       female; unknown primary  . Colon cancer Paternal Aunt 76       deceased at 7  . Stomach cancer Paternal Uncle 62       deceased at 81  . Alcohol abuse Brother   . Cancer Brother   . Hodgkin's lymphoma Brother   . Mental  illness Brother   . HIV Brother   . Healthy Brother   . Healthy Son    Allergies as of 01/26/2017      Reactions   Aspirin Anaphylaxis   Ibuprofen Anaphylaxis   Ativan [lorazepam] Other (See Comments)   Makes agitated, combative   Ketamine Other (See Comments)   Hallucinations   Toradol [ketorolac Tromethamine] Itching, Swelling, Rash   UNSPECIFIED REACTION    Tramadol Hcl Itching, Swelling, Rash  SWELLING REACTION UNSPECIFIED  Tolerates Dilaudid 06/2016.  TDD.      Medication List       Accurate as of 01/26/17  1:24 PM. Always use your most recent med list.          ADDERALL XR 30 MG 24 hr capsule Generic drug:  amphetamine-dextroamphetamine Take 60 mg by mouth daily.   amphetamine-dextroamphetamine 30 MG tablet Commonly known as:  ADDERALL Take 30 mg by mouth every evening. Before 4 pm   ALPRAZolam 1 MG tablet Commonly known as:  XANAX Take 1 mg by mouth 3 (three) times daily as needed for anxiety.   cyclobenzaprine 10 MG tablet Commonly known as:  FLEXERIL Take 1 tablet (10 mg total) by mouth 2 (two) times daily as needed for muscle spasms.   Diclofenac Sodium 2 % Soln Commonly known as:  PENNSAID Place 2 g onto the skin 4 (four) times daily as needed.   lisinopril 10 MG tablet Commonly known as:  PRINIVIL,ZESTRIL Take 1 tablet (10 mg total) by mouth daily.   meloxicam 15 MG tablet Commonly known as:  MOBIC TAKE 1 TABLET BY MOUTH EVERY DAY   ORENCIA CLICKJECT 125 MG/ML Soaj Generic drug:  Abatacept   predniSONE 5 MG tablet Commonly known as:  DELTASONE TAKE 1 TABLET (5 MG TOTAL) BY MOUTH DAILY WITH BREAKFAST.   SAVELLA 25 MG Tabs Generic drug:  Milnacipran HCl Take 1 tablet by mouth every morning.   tretinoin 0.1 % cream Commonly known as:  RETIN-A Apply topically at bedtime.   valACYclovir 1000 MG tablet Commonly known as:  VALTREX Take two tablets onset of coldsore and repeat 2 tablets in 12 hours   venlafaxine XR 75 MG 24 hr  capsule Commonly known as:  EFFEXOR-XR Take 75 mg by mouth 2 (two) times daily.   zolpidem 10 MG tablet Commonly known as:  AMBIEN TAKE 1 TABLET (10 mg) BY MOUTH AT BEDTIME AS NEEDED for sleep       All past medical history, surgical history, allergies, family history, immunizations andmedications were updated in the EMR today and reviewed under the history and medication portions of their EMR.     ROS: Negative, with the exception of above mentioned in HPI   Objective:  BP 135/89 (BP Location: Left Arm, Patient Position: Sitting, Cuff Size: Normal)   Pulse 87   Temp 99.1 F (37.3 C)   Resp 20   Wt 162 lb 12 oz (73.8 kg)   SpO2 97%   BMI 27.94 kg/m  Body mass index is 27.94 kg/m. Gen: Afebrile. No acute distress. Nontoxic in appearance, well developed, well nourished.  HENT: AT. Comunas.  MMM, no oral lesions.  Eyes:Pupils Equal Round Reactive to light, Extraocular movements intact,  Conjunctiva without redness, discharge or icterus. Abd: Soft.  NTND. BS present.  Neuro:  Normal gait. PERLA. EOMi. Alert. Oriented x3 No exam data present No results found. Results for orders placed or performed in visit on 01/26/17 (from the past 24 hour(s))  POCT Urinalysis Dipstick (Automated)     Status: Abnormal   Collection Time: 01/26/17  1:22 PM  Result Value Ref Range   Color, UA yellow    Clarity, UA clear    Glucose, UA negative    Bilirubin, UA negative    Ketones, UA negative    Spec Grav, UA >=1.030 (A) 1.010 - 1.025   Blood, UA trace-intact    pH, UA 5.5 5.0 - 8.0   Protein, UA negative  Urobilinogen, UA 0.2 0.2 or 1.0 E.U./dL   Nitrite, UA negative    Leukocytes, UA Negative Negative    Assessment/Plan: Lizet Lenard is a 61 y.o. female present for OV for  Urinary frequency - urine is negative. Patient reports she has been taking Keflex intermittently, therefore urine is negative and urine culture potentially may be negative.  Uncertain if she  has a urinary infection  at this point.  Will treat with Keflex 3 times a day for 7 days. She was advised to complete the antibiotic as prescribed. - POCT Urinalysis Dipstick (Automated) - Urine Culture - Follow-up when necessary  Chronic fatigue disorder Rheumatoid arthritis involving both hands, unspecified rheumatoid factor presence (HCC) Pseudoarthrosis of cervical spine, subsequent encounter HNP (herniated nucleus pulposus), lumbar Fibromyalgia Chronic pain syndrome - Patient was advised to follow with her orthopedic and her rheumatologist for her chronic back pain and her rheumatoid arthritis. She was encouraged to call her rheumatologist, even if he is out of town, there is usually somebody covering them.  - She has been told repeatedly we will not manage her chronic pain. She had been in pain management when she established, and she stopped going. Agreed to getting another referral to a pain clinic today for her. - Ambulatory referral to Pain Clinic   Reviewed expectations re: course of current medical issues.  Discussed self-management of symptoms.  Outlined signs and symptoms indicating need for more acute intervention.  Patient verbalized understanding and all questions were answered.  Patient received an After-Visit Summary.    Orders Placed This Encounter  Procedures  . POCT Urinalysis Dipstick (Automated)     Note is dictated utilizing voice recognition software. Although note has been proof read prior to signing, occasional typographical errors still can be missed. If any questions arise, please do not hesitate to call for verification.   electronically signed by:  Felix Pacini, DO  [facility] - OR

## 2017-01-26 NOTE — Patient Instructions (Signed)
Start keflex every 8 hours for 7 days.  Make sure to drink plenty of fluids to flush your kidneys.   I have placed a referral to pain management for you. This can take time to be accepted into a place.

## 2017-01-28 LAB — URINE CULTURE
MICRO NUMBER: 81235098
Result:: NO GROWTH
SPECIMEN QUALITY:: ADEQUATE

## 2017-01-29 ENCOUNTER — Telehealth (INDEPENDENT_AMBULATORY_CARE_PROVIDER_SITE_OTHER): Payer: Self-pay | Admitting: Orthopaedic Surgery

## 2017-01-29 NOTE — Telephone Encounter (Signed)
Patient called stating that the injection she received from Dr. Ophelia Charter has not helped her. Patient also states that her lower back is bothering her as well.  She would like to talk to you.  CB#[phone].  Thank you.

## 2017-01-29 NOTE — Telephone Encounter (Signed)
Fyi.  Patient called and left this message earlier. I was unable to call her back until this point when I saw another message that they have added her to your schedule for return appointment tomorrow.

## 2017-01-29 NOTE — Telephone Encounter (Signed)
Attempted to reach patient with no answer. Left voicemail advising (01/26/2017 afternoon).

## 2017-01-29 NOTE — Telephone Encounter (Signed)
noted 

## 2017-01-29 NOTE — Telephone Encounter (Signed)
Pt stated she is in a lot of pain I sched her for 01/30/2017 @2 :00pm

## 2017-01-30 ENCOUNTER — Ambulatory Visit (INDEPENDENT_AMBULATORY_CARE_PROVIDER_SITE_OTHER): Payer: Managed Care, Other (non HMO)

## 2017-01-30 ENCOUNTER — Encounter (INDEPENDENT_AMBULATORY_CARE_PROVIDER_SITE_OTHER): Payer: Self-pay | Admitting: Orthopaedic Surgery

## 2017-01-30 ENCOUNTER — Ambulatory Visit (INDEPENDENT_AMBULATORY_CARE_PROVIDER_SITE_OTHER): Payer: Managed Care, Other (non HMO) | Admitting: Orthopaedic Surgery

## 2017-01-30 VITALS — Ht 64.0 in | Wt 164.0 lb

## 2017-01-30 DIAGNOSIS — M545 Low back pain: Secondary | ICD-10-CM | POA: Diagnosis not present

## 2017-01-30 NOTE — Progress Notes (Signed)
Office Visit Note   Patient: Kara [name]           Date of Birth: [birth year]           MRN: [id number] Visit Date: 01/30/2017              Requested by: Natalia Leatherwood, DO [address] [place], [place] [address] PCP: Natalia Leatherwood, DO   Assessment & Plan: Visit Diagnoses:  1. Acute bilateral low back pain, with sciatica presence unspecified     Plan: We will proceed with a lumbar MRI with and without contrast to rule out recurrent HNP on the right at L3-4.  She is having severe incapacitating pain.  Follow-Up Instructions: No Follow-up on file.   Orders:  Orders Placed This Encounter  Procedures  . XR Lumbar Spine Complete   No orders of the defined types were placed in this encounter.     Procedures: No procedures performed   Clinical Data: No additional findings.   Subjective: Chief Complaint  Patient presents with  . Lower Back - Pain  . Right Leg - Pain    HPI patient returns and states she is continuing to have back pain and right leg pain she rates it more than 7 out of 10.  Her pain is been recently severe she had an epidural by Dr. Maurice Small but states she did not get relief as she was hoping.  She had previous microdiscectomy 2017 at L3-4 for a migrated caudally fragment of disc that was causing compression.  She did well for a period of time and then has had gradual increase in pain.  She states she cannot stand the pain she would consider going to the emergency room but feels like they will not prescribe her any medications for pain.  Review of Systems 14 point review of systems is updated and is.  Past history of cervical fusion posterior cervical procedure postoperative infection requiring VAC with gradual healing.   Objective: Vital Signs: Ht 5\' 4"  (1.626 m)   Wt 164 lb (74.4 kg)   BMI 28.15 kg/m   Physical Exam  Constitutional: She is oriented to person, place, and time. She appears well-developed.  HENT:  Head: Normocephalic.  Right Ear:  External ear normal.  Left Ear: External ear normal.  Eyes: Pupils are equal, round, and reactive to light.  Neck: No tracheal deviation present. No thyromegaly present.  Cardiovascular: Normal rate.  Pulmonary/Chest: Effort normal.  Abdominal: Soft.  Neurological: She is alert and oriented to person, place, and time.  Skin: Skin is warm and dry.  Psychiatric: She has a normal mood and affect. Her behavior is normal.    Ortho Exam anterior tib EHL is active no gastrocsoleus atrophy.  With straight leg raising she has pain that radiates down to the lateral ankle on the right negative on the left.  Lumbar incision is well-healed no drainage no cellulitis.  Specialty Comments:  No specialty comments available.  Imaging: Xr Lumbar Spine Complete  Result Date: 01/30/2017 AP lateral and lateral flexion-extension x-rays lumbar spine obtained.  This shows instrumented fusion at L4-5 L5S1.  No evidence of loosening of screws no change in gait position.  She has sclerotic endplates with some narrowing at L3-4 without listhesis. Impression: Post L4-5, L5-S1 instrumented fusion.  Endplate sclerosis with narrowing at L3-4 level above the  L4-S1 fusion    PMFS History: Patient Active Problem List   Diagnosis Date Noted  . S/P lumbar spinal fusion 01/01/2017  .  Essential hypertension 07/26/2016  . Acne 07/26/2016  . DNR (do not resuscitate) discussion   . Palliative care by specialist   . Cervical pseudoarthrosis (HCC) 06/21/2016  . Opioid dependence in remission (HCC) 06/14/2016  . Mitral valve prolapse 01/04/2016  . HNP (herniated nucleus pulposus), lumbar 12/10/2015  . Prediabetes 08/19/2015  . BMI 26.0-26.9,adult 08/18/2015  . Chronic pain 08/18/2015  . Vitamin D deficiency 08/18/2015  . Chronic fatigue disorder 06/14/2015  . S/P cervical spinal fusion 03/15/2015  . PTSD (post-traumatic stress disorder) 03/07/2014  . Severe recurrent major depression without psychotic features (HCC)  03/06/2014  . Suicide threat or attempt 03/06/2014  . Aggressive behavior   . Family history of malignant neoplasm of breast   . Rheumatoid arthritis (HCC) 12/13/2011  . Fibromyalgia 12/13/2011  . H/O cold sores 12/13/2011   Past Medical History:  Diagnosis Date  . ADD (attention deficit disorder)    on Adderal  . Aggressive behavior of adult   . Allergy   . Anxiety   . Arthritis   . Asthma    as a child  . Cellulitis of breast 11/2013   RIGHT BREAST  . 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 Chronic kidney disease    due to infection from stone- no regular visits- fine now  . Chronic pain    [facility]es to [facility]  . Cold sore   . 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 Depression    takes meds daily  . Family history of malignant neoplasm of breast   . Fibromyalgia   . Nephrolithiasis   . Osteoporosis   . Other specified rheumatoid arthritis, right shoulder (HCC) 08/01/2011  . Post-nasal drip    hx of  . Rheumatoid arthritis(714.0)    autoimmune arthritis; methotrexate once/week  . Urinary incontinence     Family History  Problem Relation Age of Onset  . Arthritis Mother   . Heart disease Mother        ?psvt  . Breast cancer Mother 19       TAH/BSO  . Cancer Mother   . Mental illness Mother   . COPD Father   . Hypertension Father   . Alcohol abuse Father   . Mental illness Father   . Heart disease Father   . Healthy Daughter   . Breast cancer Maternal Aunt 27       deceased  . Cancer Cousin 34       female; unknown primary  . Colon cancer Paternal Aunt 37       deceased at 30  . Stomach cancer Paternal Uncle 19       deceased at 21  . Alcohol abuse Brother   . Cancer Brother   . Hodgkin's lymphoma Brother   . Mental illness Brother   . HIV Brother   . Healthy Brother   . Healthy Son     Past Surgical History:  Procedure Laterality Date  . BACK SURGERY  2011,16    lower back fusion l4-5, s1  . BLADDER SURGERY  2009  . BREAST ENHANCEMENT SURGERY  2003  . breast implants removed    . BREAST SURGERY  10/2013   REMOVAL OF BREAST IMPLANTS  . JOINT REPLACEMENT    . LIPOSUCTION  2003   abdominal  . POSTERIOR FUSION CERVICAL SPINE  06/21/2016   C5 C7  . SHOULDER SURGERY  2012  right  . TONSILLECTOMY  1987  . TUBAL LIGATION  1988   Social History   Occupational History  . Not on file  Tobacco Use  . Smoking status: Former Smoker    Packs/day: 0.50    Years: 10.00    Pack years: 5.00    Types: Cigarettes    Last attempt to quit: 12/06/2005    Years since quitting: 11.1  . Smokeless tobacco: Never Used  . Tobacco comment: smoked for 10 years  Substance and Sexual Activity  . Alcohol use: No    Comment: denied alcohol  . Drug use: No    Comment: denied any drug use with admission nurse  . Sexual activity: Yes    Birth control/protection: Post-menopausal

## 2017-01-30 NOTE — Addendum Note (Signed)
Addended by: Rogers Seeds on: 01/30/2017 03:07 PM   Modules accepted: Orders

## 2017-01-30 NOTE — Telephone Encounter (Signed)
Has 2 PM appt.

## 2017-01-31 ENCOUNTER — Ambulatory Visit (INDEPENDENT_AMBULATORY_CARE_PROVIDER_SITE_OTHER): Payer: Managed Care, Other (non HMO) | Admitting: Orthopaedic Surgery

## 2017-02-02 ENCOUNTER — Other Ambulatory Visit (INDEPENDENT_AMBULATORY_CARE_PROVIDER_SITE_OTHER): Payer: Self-pay | Admitting: Orthopaedic Surgery

## 2017-02-02 NOTE — Telephone Encounter (Signed)
Ok for refill? 

## 2017-02-02 NOTE — Telephone Encounter (Signed)
Ok thanks 

## 2017-02-10 ENCOUNTER — Ambulatory Visit
Admission: RE | Admit: 2017-02-10 | Discharge: 2017-02-10 | Disposition: A | Discharge status: Home / Self Care | Payer: Managed Care, Other (non HMO) | Source: Ambulatory Visit | Attending: Orthopaedic Surgery | Admitting: Orthopaedic Surgery

## 2017-02-10 DIAGNOSIS — M545 Low back pain: Secondary | ICD-10-CM

## 2017-02-10 MED ORDER — GADOBENATE DIMEGLUMINE 529 MG/ML IV SOLN
15.0000 mL | Freq: Once | INTRAVENOUS | Status: AC | PRN
  Administered 2017-02-10: 15 mL via INTRAVENOUS

## 2017-02-12 ENCOUNTER — Encounter (INDEPENDENT_AMBULATORY_CARE_PROVIDER_SITE_OTHER): Payer: Self-pay | Admitting: Orthopaedic Surgery

## 2017-02-12 ENCOUNTER — Ambulatory Visit (INDEPENDENT_AMBULATORY_CARE_PROVIDER_SITE_OTHER): Payer: Managed Care, Other (non HMO)

## 2017-02-12 ENCOUNTER — Ambulatory Visit (INDEPENDENT_AMBULATORY_CARE_PROVIDER_SITE_OTHER): Payer: Managed Care, Other (non HMO) | Admitting: Orthopaedic Surgery

## 2017-02-12 VITALS — BP 137/82 | HR 114 | Ht 64.0 in | Wt 164.0 lb

## 2017-02-12 DIAGNOSIS — M542 Cervicalgia: Secondary | ICD-10-CM

## 2017-02-12 DIAGNOSIS — Z981 Arthrodesis status: Secondary | ICD-10-CM

## 2017-02-12 DIAGNOSIS — M48062 Spinal stenosis, lumbar region with neurogenic claudication: Secondary | ICD-10-CM

## 2017-02-12 MED ORDER — HYDROCODONE-ACETAMINOPHEN 5-325 MG PO TABS: 1.0000 | ORAL_TABLET | Freq: Two times a day (BID) | ORAL | 0 refills | Status: DC | PRN

## 2017-02-12 NOTE — Progress Notes (Signed)
Office Visit Note   Patient: Kara [name]           Date of Birth: [birth year]           MRN: [id number] Visit Date: 02/12/2017              Requested by: Natalia Leatherwood, DO [address] [place], [place] [address] PCP: Natalia Leatherwood, DO   Assessment & Plan: Visit Diagnoses:  1. Neck pain   2. S/P lumbar spinal fusion   3. Spinal stenosis of lumbar region with neurogenic claudication     Plan: Patient had previous L4-S1 fusion.  She is solidly fused at this level but is now developed severe stenosis at the L3-4 level above a solid fusion.  Microdiscectomy done for disc extrusion with radiculopathy MRI shows no evidence of disc recurrence progressive development of multifactorial spinal stenosis at the L3-4 level.  Claudication symptoms with standing does better if she leans forward 10 minutes and is able to ambulate 2-300 feet before she has to stop and sit down.  When she had a posterior fusion done for pseudoarthrosis of the cervical spine she developed an MSSA infection with repeat surgical debridement and VAC that has healed completely.  Patient would require decompression at L3-4 and extension of her fusion up 1 level at L3-4.  With her past history she is at risk for postoperative infection discussed in detail.  She will require exposure and removal of rods right and left at L4-S1, placement of new pedicle screws at L3 and  rod placement r from L3-S1 .  Risks of surgery discussed including dural tear pseudoarthrosis, infection, repeat surgery, possibility of cage migration revision surgery discussed.  She has  already been through narcotic medication, epidural steroid injections, prednisone Dosepak, muscle relaxant, exercise program without relief.  Follow-Up Instructions: No Follow-up on file.   Orders:  Orders Placed This Encounter  Procedures  . XR Cervical Spine 2 or 3 views   Meds ordered this encounter  Medications  . HYDROcodone-acetaminophen (NORCO/VICODIN) 5-325 MG tablet      Sig: Take 1 tablet every 12 (twelve) hours as needed by mouth for moderate pain.    Dispense:  30 tablet    Refill:  0      Procedures: No procedures performed   Clinical Data: No additional findings.   Subjective: Chief Complaint  Patient presents with  . Lower Back - Pain    HPI 61 year old female returns with ongoing back pain.  Previous fusion from L4-S1 done in 2016 and also right L3-4 laminectomy and excision of herniated disc at the L3-4 level.  Had gradual progressive back pain hip pain that radiates down her legs worse with standing and walking.  Has also had previous fusion done in December 2016 posterior fusion required repeat surgery.  Past history of prescription narcotic usage times at higher doses off and on.  History of recurrent major depression, PTSD chronic fatigue, aggressive behavior, rheumatoid arthritis, prediabetes.  Lumbar MRI scan was obtained due to her progressive pain.  Review of Systems 14 point review of systems is updated and is unchanged office visit other than as mentioned in HPI.   Objective: Vital Signs: BP 137/82   Pulse (!) 114   Ht 5\' 4"  (1.626 m)   Wt 164 lb (74.4 kg)   BMI 28.15 kg/m   Physical Exam  Constitutional: She is oriented to person, place, and time. She appears well-developed.  HENT:  Head: Normocephalic.  Right Ear:  External ear normal.  Left Ear: External ear normal.  Posterior cervical incision is healed with prominent posteriorly no drainage no cellulitis.  Eyes: Pupils are equal, round, and reactive to light.  Neck: No tracheal deviation present. No thyromegaly present.  Cardiovascular: Normal rate.  Pulmonary/Chest: Effort normal.  Abdominal: Soft.  Neurological: She is alert and oriented to person, place, and time.  Skin: Skin is warm and dry.  Psychiatric: Judgment and thought content normal.    Ortho Exam well-healed lumbar incision posteriorly..  Knee and ankle jerk are intact.  Pulses are intact.  No  pain with hip range of motion.  Tenderness of both greater trochanteric region worse on the right than left.  Reverse straight leg raising.  No quad anterior tib or gastrocsoleus weakness.  Specialty Comments:  No specialty comments available.  Imaging: CLINICAL DATA:  Back pain.  Prior lumbar spine surgery.  EXAM: MRI LUMBAR SPINE WITHOUT AND WITH CONTRAST  TECHNIQUE: Multiplanar and multiecho pulse sequences of the lumbar spine were obtained without and with intravenous contrast.  Creatinine was obtained on site at [facility] at [address]  Results: Creatinine 0.8 mg/dL.  CONTRAST:  23mL MULTIHANCE GADOBENATE DIMEGLUMINE 529 MG/ML IV SOLN  COMPARISON:  11/14/2015  FINDINGS: Segmentation:  Standard.  Alignment:  Fused L5-S1 anterolisthesis.  Vertebrae: Hemangioma in the L1 body. Mild degenerative edematous signal about the posterior L3-4 disc space. No acute fracture, discitis, or aggressive bone lesion.  Conus medullaris: Extends to the L1 level and appears normal.  Paraspinal and other soft tissues: Cholelithiasis.  Disc levels:  T11-12: Disc narrowing and bulging. Biforaminal protrusions with noncompressive stenosis.  T12- L1: Unremarkable.  L1-L2: Mild spondylosis and annulus bulging.  No impingement  L2-L3: Degenerative facet arthropathy with bony and ligamentous hypertrophy, progressed. Disc desiccation, narrowing, and mild bulging. No impingement  L3-L4: Advanced facet arthropathy with ligamentum flavum thickening and interspinous cystic change, progressed. There is progressive disc desiccation and narrowing with biforaminal bulging and a right extraforaminal disc protrusion that is bulky and contacts the L3 nerve root. Spinal stenosis is advanced with no visible residual subarachnoid space. A right subarticular recess extrusion has been resected, with fibrosis in this area that is non masslike.  L4-L5: Anterior and  posterior fusion with posterior decompression. Patent canal and foramina.  L5-S1:Anterior and posterior fusion with posterior decompression. Patent canal and foramina.  IMPRESSION: 1. L3-4 progressive adjacent disc and facet degeneration with high-grade spinal stenosis. Progressed disc narrowing and bulging with right more than left foraminal stenosis. There is a right far-lateral disc protrusion contacting the right L3 nerve root. 2. Interval L3-4 right paracentral microdiscectomy. There is postoperative epidural fibrosis without recurrent herniation. 3. L2-3 degenerative facet arthropathy with progression from 2017. No compressive stenosis. 4. L4-5 and L5-S1 anterior and posterior arthrodesis without recurrent impingement. 5. Cholelithiasis.   Electronically Signed   By: Marnee Spring M.D.   On: 02/10/2017 20:27   PMFS History: Patient Active Problem List   Diagnosis Date Noted  . Spinal stenosis of lumbar region with neurogenic claudication 02/19/2017  . S/P lumbar spinal fusion 01/01/2017  . Essential hypertension 07/26/2016  . Acne 07/26/2016  . DNR (do not resuscitate) discussion   . Palliative care by specialist   . Cervical pseudoarthrosis (HCC) 06/21/2016  . Opioid dependence in remission (HCC) 06/14/2016  . Mitral valve prolapse 01/04/2016  . HNP (herniated nucleus pulposus), lumbar 12/10/2015  . Prediabetes 08/19/2015  . BMI 26.0-26.9,adult 08/18/2015  . Chronic pain 08/18/2015  .  Vitamin D deficiency 08/18/2015  . Chronic fatigue disorder 06/14/2015  . S/P cervical spinal fusion 03/15/2015  . PTSD (post-traumatic stress disorder) 03/07/2014  . Severe recurrent major depression without psychotic features (HCC) 03/06/2014  . Suicide threat or attempt 03/06/2014  . Aggressive behavior   . Family history of malignant neoplasm of breast   . Rheumatoid arthritis (HCC) 12/13/2011  . Fibromyalgia 12/13/2011  . H/O cold sores 12/13/2011   Past Medical  History:  Diagnosis Date  . ADD (attention deficit disorder)    on Adderal  . Aggressive behavior of adult   . Allergy   . Anxiety   . Arthritis   . Asthma    as a child  . Cellulitis of breast 11/2013   RIGHT BREAST  . 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 Chronic kidney disease    due to infection from stone- no regular visits- fine now  . Chronic pain    goes to [facility] for pain control  . Cold sore   . 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 Depression    takes meds daily  . Family history of malignant neoplasm of breast   . Fibromyalgia   . Nephrolithiasis   . Osteoporosis   . Other specified rheumatoid arthritis, right shoulder (HCC) 08/01/2011  . Post-nasal drip    hx of  . Rheumatoid arthritis(714.0)    autoimmune arthritis; methotrexate once/week  . Urinary incontinence     Family History  Problem Relation Age of Onset  . Arthritis Mother   . Heart disease Mother        ?psvt  . Breast cancer Mother 28       TAH/BSO  . Cancer Mother   . Mental illness Mother   . COPD Father   . Hypertension Father   . Alcohol abuse Father   . Mental illness Father   . Heart disease Father   . Healthy Daughter   . Breast cancer Maternal Aunt 27       deceased  . Cancer Cousin 29       female; unknown primary  . Colon cancer Paternal Aunt 36       deceased at 81  . Stomach cancer Paternal Uncle 48       deceased at 77  . Alcohol abuse Brother   . Cancer Brother   . Hodgkin's lymphoma Brother   . Mental illness Brother   . HIV Brother   . Healthy Brother   . Healthy Son     Past Surgical History:  Procedure Laterality Date  . ANTERIOR CERVICAL DECOMP/DISCECTOMY FUSION N/A 03/15/2015   Procedure: Cervical five-six, Cerival six-seven, Anterior Cervical Discectomy and Fusion, Allograft and Plate;  Surgeon: Eldred Manges, MD;  Location: [facility] OR;  Service: Orthopedics;  Laterality: N/A;  . BACK  SURGERY  2011,16   lower back fusion l4-5, s1  . BLADDER SURGERY  2009  . BREAST ENHANCEMENT SURGERY  2003  . breast implants removed    . BREAST SURGERY  10/2013   REMOVAL OF BREAST IMPLANTS  . CERVICAL WOUND DEBRIDEMENT N/A 07/07/2016   Procedure: IRRIGATION AND DEBRIDEMENT POSTERIOR NECK;  Surgeon: Eldred Manges, MD;  Location: [facility] OR;  Service: Orthopedics;  Laterality: N/A;  . INCISION AND DRAINAGE ABSCESS Right 01/16/2014   Procedure: INCISION AND DRAINAGE AND OF RIGHT BREAST ABCESS;  Surgeon: Glenna Fellows, MD;  Location: [facility];  Service: General;  Laterality: Right;  . JOINT REPLACEMENT    . LIPOSUCTION  2003   abdominal  . LUMBAR LAMINECTOMY/DECOMPRESSION MICRODISCECTOMY N/A 12/10/2015   Procedure: Right L3-4 Hemilaminectomy, Excision of herniated nucleus pulposus;  Surgeon: Eldred MangesMark C Jalie Eiland, MD;  [facility];  Service: Orthopedics;  Laterality: N/A;  . MASS EXCISION  11/03/2011   Procedure: MINOR EXCISION OF MASS;  Surgeon: Wyn Forsterobert V Sypher Jr., MD;  Location: [facility];  Service: Orthopedics;  Laterality: Left;  debride IP joint, cyst excision left index  . POSTERIOR CERVICAL FUSION/FORAMINOTOMY N/A 06/21/2016   Procedure: POSTERIOR CERVICAL FUSION C5-C7 SPINOUS PROCESS WIRING;  Surgeon: Eldred MangesYates, Zamyia Gowell C, MD;  Location: [facility] OR;  Service: Orthopedics;  Laterality: N/A;  . POSTERIOR FUSION CERVICAL SPINE  06/21/2016   C5 C7  . SHOULDER SURGERY  2012   right  . TONSILLECTOMY  1987  . TOTAL SHOULDER ARTHROPLASTY  08/01/2011   Procedure: TOTAL SHOULDER ARTHROPLASTY;  Surgeon: Eulas PostJoshua P Landau, MD;  Location: [facility] OR;  Service: Orthopedics;  Laterality: Right;  Right total shoulder arthroplasty  . TUBAL LIGATION  1988   Social History   Occupational History  . Not on file  Tobacco Use  . Smoking status: Former Smoker    Packs/day: 0.50    Years: 10.00    Pack years: 5.00    Types: Cigarettes    Last attempt to quit: 12/06/2005    Years since quitting: 11.2  . Smokeless  tobacco: Never Used  . Tobacco comment: smoked for 10 years  Substance and Sexual Activity  . Alcohol use: No    Comment: denied alcohol  . Drug use: No    Comment: denied any drug use with admission nurse  . Sexual activity: Yes    Birth control/protection: Post-menopausal

## 2017-02-19 ENCOUNTER — Encounter (INDEPENDENT_AMBULATORY_CARE_PROVIDER_SITE_OTHER): Payer: Self-pay | Admitting: Orthopaedic Surgery

## 2017-02-19 DIAGNOSIS — M48062 Spinal stenosis, lumbar region with neurogenic claudication: Secondary | ICD-10-CM | POA: Insufficient documentation

## 2017-02-21 ENCOUNTER — Other Ambulatory Visit (HOSPITAL_COMMUNITY): Payer: Self-pay | Admitting: *Deleted

## 2017-02-21 ENCOUNTER — Encounter (HOSPITAL_COMMUNITY)
Admission: RE | Admit: 2017-02-21 | Discharge: 2017-02-21 | Disposition: A | Discharge status: Home / Self Care | Payer: Managed Care, Other (non HMO) | Source: Ambulatory Visit | Attending: Orthopaedic Surgery | Admitting: Orthopaedic Surgery

## 2017-02-21 ENCOUNTER — Other Ambulatory Visit: Payer: Self-pay

## 2017-02-21 ENCOUNTER — Encounter (HOSPITAL_COMMUNITY): Payer: Self-pay

## 2017-02-21 HISTORY — DX: Prediabetes: R73.03

## 2017-02-21 HISTORY — DX: Other intervertebral disc degeneration, lumbar region without mention of lumbar back pain or lower extremity pain: M51.369

## 2017-02-21 HISTORY — DX: Other intervertebral disc degeneration, lumbar region: M51.36

## 2017-02-21 HISTORY — DX: Pneumonia, unspecified organism: J18.9

## 2017-02-21 HISTORY — DX: Essential (primary) hypertension: I10

## 2017-02-21 HISTORY — DX: Anemia, unspecified: D64.9

## 2017-02-21 HISTORY — DX: Personal history of urinary calculi: Z87.442

## 2017-02-21 LAB — CBC
HCT: 46.1 % — ABNORMAL HIGH (ref 36.0–46.0)
Hemoglobin: 15.3 g/dL — ABNORMAL HIGH (ref 12.0–15.0)
MCH: 28.2 pg (ref 26.0–34.0)
MCHC: 33.2 g/dL (ref 30.0–36.0)
MCV: 85.1 fL (ref 78.0–100.0)
PLATELETS: 357 10*3/uL (ref 150–400)
RBC: 5.42 MIL/uL — ABNORMAL HIGH (ref 3.87–5.11)
RDW: 14.7 % (ref 11.5–15.5)
WBC: 10.3 10*3/uL (ref 4.0–10.5)

## 2017-02-21 LAB — PROTIME-INR
INR: 0.98
PROTHROMBIN TIME: 12.9 s (ref 11.4–15.2)

## 2017-02-21 LAB — COMPREHENSIVE METABOLIC PANEL
ALT: 17 U/L (ref 14–54)
AST: 20 U/L (ref 15–41)
Albumin: 4.2 g/dL (ref 3.5–5.0)
Alkaline Phosphatase: 109 U/L (ref 38–126)
Anion gap: 9 (ref 5–15)
BILIRUBIN TOTAL: 0.4 mg/dL (ref 0.3–1.2)
BUN: 20 mg/dL (ref 6–20)
CO2: 26 mmol/L (ref 22–32)
CREATININE: 0.8 mg/dL (ref 0.44–1.00)
Calcium: 10 mg/dL (ref 8.9–10.3)
Chloride: 100 mmol/L — ABNORMAL LOW (ref 101–111)
Glucose, Bld: 85 mg/dL (ref 65–99)
POTASSIUM: 4.1 mmol/L (ref 3.5–5.1)
Sodium: 135 mmol/L (ref 135–145)
TOTAL PROTEIN: 7.6 g/dL (ref 6.5–8.1)

## 2017-02-21 LAB — URINALYSIS, ROUTINE W REFLEX MICROSCOPIC
Bilirubin Urine: NEGATIVE
GLUCOSE, UA: NEGATIVE mg/dL
HGB URINE DIPSTICK: NEGATIVE
KETONES UR: NEGATIVE mg/dL
Leukocytes, UA: NEGATIVE
Nitrite: NEGATIVE
PROTEIN: NEGATIVE mg/dL
Specific Gravity, Urine: 1.021 (ref 1.005–1.030)
pH: 6 (ref 5.0–8.0)

## 2017-02-21 LAB — SURGICAL PCR SCREEN
MRSA, PCR: NEGATIVE
STAPHYLOCOCCUS AUREUS: NEGATIVE

## 2017-02-21 LAB — TYPE AND SCREEN
ABO/RH(D): O NEG
Antibody Screen: NEGATIVE

## 2017-02-21 LAB — HEMOGLOBIN A1C
Hgb A1c MFr Bld: 5.9 % — ABNORMAL HIGH (ref 4.8–5.6)
MEAN PLASMA GLUCOSE: 122.63 mg/dL

## 2017-02-21 LAB — APTT: APTT: 28 s (ref 24–36)

## 2017-02-21 NOTE — Progress Notes (Addendum)
Pt denies cardiac history, chest pain or sob. There was a note that stated pt was pre-diabetic, pt states she doesn't remember being told that, but they usually check her blood sugar quite often when she is in the hospital. Last A1C in Epic was 5.8 on 12/14/15. Pt requested that I put a note in about pt's pain concerns. She states she needs a lot of patience from the nursing staff when she is in pain. She states she has had numerous surgeries and has chronic pain/fatigue and when she is hurting, she tends to be not very nice especially if she feels that she is not being listened to very well. She has a history of being on numerous strong narcotics and she states even though she doesn't use them like she used to, she states it still takes more than it would for someone who doesn't take narcotics.

## 2017-02-21 NOTE — Pre-Procedure Instructions (Signed)
Kara [name]  02/21/2017    Your procedure is scheduled on Friday, February 23, 2017 at 7:30 AM.   Report to [facility] Entrance "A" Admitting Office at 5:30 AM.   Call this number if you have problems the morning of surgery: [phone]   Questions prior to day of surgery, please call [phone] between 8 & 4 PM.   Remember:  Do not eat food or drink liquids after midnight Thursday, 02/21/17.  Take these medicines the morning of surgery with A SIP OF WATER: Venlafaxine (Effexor), Oxycodone - if needed, Alprazolam (Xanax) - if needed, may use eye drops if needed  Stop NSAIDS (Meloxicam, Aleve, Ibuprofen, etc) as of today. Do not use Aspirin products prior to surgery.   Do not wear jewelry, make-up or nail polish.  Do not wear lotions, powders, perfumes or deodorant.  Do not shave 48 hours prior to surgery.    Do not bring valuables to the hospital.  [facility] is not responsible for any belongings or valuables.  Contacts, dentures or bridgework may not be worn into surgery.  Leave your suitcase in the car.  After surgery it may be brought to your room.  For patients admitted to the hospital, discharge time will be determined by your treatment team.  [facility] - Preparing for Surgery  Before surgery, you can play an important role.  Because skin is not sterile, your skin needs to be as free of germs as possible.  You can reduce the number of germs on you skin by washing with CHG (chlorahexidine gluconate) soap before surgery.  CHG is an antiseptic cleaner which kills germs and bonds with the skin to continue killing germs even after washing.  Please DO NOT use if you have an allergy to CHG or antibacterial soaps.  If your skin becomes reddened/irritated stop using the CHG and inform your nurse when you arrive at Short Stay.  Do not shave (including legs and underarms) for at least 48 hours prior to the first CHG shower.  You may shave your face.  Please follow these  instructions carefully:   1.  Shower with CHG Soap the night before surgery and the                   morning of Surgery.  2.  If you choose to wash your hair, wash your hair first as usual with your       normal shampoo.  3.  After you shampoo, rinse your hair and body thoroughly to remove the shampoo.  4.  Use CHG as you would any other liquid soap.  You can apply chg directly       to the skin and wash gently with scrungie or a clean washcloth.  5.  Apply the CHG Soap to your body ONLY FROM THE NECK DOWN.        Do not use on open wounds or open sores.  Avoid contact with your eyes, ears, mouth and genitals (private parts).  Wash genitals (private parts) with your normal soap.  6.  Wash thoroughly, paying special attention to the area where your surgery        will be performed.  7.  Thoroughly rinse your body with warm water from the neck down.  8.  DO NOT shower/wash with your normal soap after using and rinsing off       the CHG Soap.  9.  Pat yourself dry with a clean towel.  10.  Wear clean pajamas.            11.  Place clean sheets on your bed the night of your first shower and do not        sleep with pets.  Day of Surgery  Shower as above. Do not apply any lotions/deodorants the morning of surgery.  Please wear clean clothes to the hospital.   Please read over the fact sheets that you were given.

## 2017-02-22 NOTE — Anesthesia Preprocedure Evaluation (Addendum)
Anesthesia Evaluation  Patient identified by MRN, date of birth, ID band Patient awake    Reviewed: Allergy & Precautions, H&P , NPO status , Patient's Chart, lab work & pertinent test results  Airway Mallampati: II  TM Distance: >3 FB Neck ROM: Full    Dental no notable dental hx. (+) Teeth Intact, Dental Advisory Given   Pulmonary asthma , former smoker,    Pulmonary exam normal breath sounds clear to auscultation       Cardiovascular Exercise Tolerance: Good hypertension, Pt. on medications  Rhythm:Regular Rate:Normal     Neuro/Psych Anxiety Depression negative neurological ROS     GI/Hepatic negative GI ROS, Neg liver ROS,   Endo/Other  negative endocrine ROS  Renal/GU negative Renal ROS  negative genitourinary   Musculoskeletal  (+) Arthritis , Rheumatoid disorders,  Fibromyalgia -  Abdominal   Peds  (+) ATTENTION DEFICIT DISORDER WITHOUT HYPERACTIVITY Hematology negative hematology ROS (+)   Anesthesia Other Findings   Reproductive/Obstetrics negative OB ROS                            Anesthesia Physical Anesthesia Plan  ASA: II  Anesthesia Plan: General   Post-op Pain Management:    Induction: Intravenous  PONV Risk Score and Plan: 4 or greater and Ondansetron, Dexamethasone and Midazolam  Airway Management Planned: Oral ETT  Additional Equipment:   Intra-op Plan:   Post-operative Plan: Extubation in OR  Informed Consent: I have reviewed the patients History and Physical, chart, labs and discussed the procedure including the risks, benefits and alternatives for the proposed anesthesia with the patient or authorized representative who has indicated his/her understanding and acceptance.   Dental advisory given  Plan Discussed with: CRNA  Anesthesia Plan Comments:         Anesthesia Quick Evaluation

## 2017-02-22 NOTE — H&P (Signed)
Kara [name] is an 61 y.o. female.   Chief Complaint: Low back pain and claudication HPI: Patient history of L3-4 stenosis and the above complaint presents for surgical intervention. Progressively worsening symptoms. Failed conservative treatment.  Past Medical History:  Diagnosis Date  . ADD (attention deficit disorder)    on Adderal  . Aggressive behavior of adult   . Allergy   . Anemia   . Anxiety   . Arthritis   . Asthma    as a child  . Cellulitis of breast 11/2013   RIGHT BREAST  . Chronic fatigue and immune dysfunction syndrome ([place])   . Chronic pain    goes to [facility] for pain control  . Cold sore   . 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 Degenerative disc disease, lumbar   . Depression    takes meds daily  . Family history of malignant neoplasm of breast   . Fibromyalgia   . History of kidney stones   . Hypertension   . Osteoporosis   . Other specified rheumatoid arthritis, right shoulder ([place]) 08/01/2011  . Pneumonia   . Post-nasal drip    hx of  . Pre-diabetes   . Rheumatoid arthritis(714.0)    autoimmune arthritis; methotrexate once/week  . Urinary incontinence     Past Surgical History:  Procedure Laterality Date  . ANTERIOR CERVICAL DECOMP/DISCECTOMY FUSION N/A 03/15/2015   Procedure: Cervical five-six, Cerival six-seven, Anterior Cervical Discectomy and Fusion, Allograft and Plate;  Surgeon: Marybelle Killings, MD;  Location: [facility];  Service: Orthopedics;  Laterality: N/A;  . BACK SURGERY  2011,16   lower back fusion l4-5, s1  . BLADDER SURGERY  2009  . BREAST ENHANCEMENT SURGERY  2003  . breast implants removed    . BREAST SURGERY  10/2013   REMOVAL OF BREAST IMPLANTS  . CERVICAL WOUND DEBRIDEMENT N/A 07/07/2016   Procedure: IRRIGATION AND DEBRIDEMENT POSTERIOR NECK;  Surgeon: Marybelle Killings, MD;  Location: [facility];  Service: Orthopedics;  Laterality: N/A;  . COLONOSCOPY    . INCISION AND DRAINAGE ABSCESS  Right 01/16/2014   Procedure: INCISION AND DRAINAGE AND OF RIGHT BREAST ABCESS;  Surgeon: Excell Seltzer, MD;  Location: [facility];  Service: General;  Laterality: Right;  . JOINT REPLACEMENT    . LIPOSUCTION  2003   abdominal  . LUMBAR LAMINECTOMY/DECOMPRESSION MICRODISCECTOMY N/A 12/10/2015   Procedure: Right L3-4 Hemilaminectomy, Excision of herniated nucleus pulposus;  Surgeon: Marybelle Killings, MD;  Location: [facility];  Service: Orthopedics;  Laterality: N/A;  . MASS EXCISION  11/03/2011   Procedure: MINOR EXCISION OF MASS;  Surgeon: Cammie Sickle., MD;  Location: [facility];  Service: Orthopedics;  Laterality: Left;  debride IP joint, cyst excision left index  . POSTERIOR CERVICAL FUSION/FORAMINOTOMY N/A 06/21/2016   Procedure: POSTERIOR CERVICAL FUSION C5-C7 SPINOUS PROCESS WIRING;  Surgeon: Marybelle Killings, MD;  Location: [facility];  Service: Orthopedics;  Laterality: N/A;  . POSTERIOR FUSION CERVICAL SPINE  06/21/2016   C5 C7  . SHOULDER SURGERY  2012   right  . TONSILLECTOMY  1987  . TOTAL SHOULDER ARTHROPLASTY  08/01/2011   Procedure: TOTAL SHOULDER ARTHROPLASTY;  Surgeon: Johnny Bridge, MD;  Location: [facility];  Service: Orthopedics;  Laterality: Right;  Right total shoulder arthroplasty  . TUBAL LIGATION  1988    Family History  Problem Relation Age of Onset  . Arthritis Mother   .  Heart disease Mother        ?psvt  . Breast cancer Mother 52       TAH/BSO  . Cancer Mother   . Mental illness Mother   . COPD Father   . Hypertension Father   . Alcohol abuse Father   . Mental illness Father   . Heart disease Father   . Healthy Daughter   . Breast cancer Maternal Aunt 27       deceased  . Cancer Cousin 11       female; unknown primary  . Colon cancer Paternal Aunt 75       deceased at 3  . Stomach cancer Paternal Uncle 5       deceased at 74  . Alcohol abuse Brother   . Cancer Brother   . Hodgkin's lymphoma Brother   . Mental illness Brother   . HIV Brother    . Healthy Brother   . Healthy Son    Social History:  reports that she quit smoking about 11 years ago. Her smoking use included cigarettes. She has a 5.00 pack-year smoking history. she has never used smokeless tobacco. She reports that she does not drink alcohol or use drugs.  Allergies:  Allergies  Allergen Reactions  . Aspirin Anaphylaxis  . Ibuprofen Anaphylaxis  . Ativan [Lorazepam] Other (See Comments)    AGITATION CONFUSION   . Ketamine Other (See Comments)    Hallucinations  . Toradol [Ketorolac Tromethamine] Itching, Swelling and Rash    UNSPECIFIED REACTION   . Tramadol Hcl Itching, Swelling and Rash    SWELLING REACTION UNSPECIFIED  Tolerates Dilaudid 06/2016.  TDD.    No medications prior to admission.    Results for orders placed or performed during the hospital encounter of 02/21/17 (from the past 48 hour(s))  Hemoglobin A1c     Status: Abnormal   Collection Time: 02/21/17  1:48 PM  Result Value Ref Range   Hgb A1c MFr Bld 5.9 (H) 4.8 - 5.6 %    Comment: (NOTE) Pre diabetes:          5.7%-6.4% Diabetes:              >6.4% Glycemic control for   <7.0% adults with diabetes    Mean Plasma Glucose 122.63 mg/dL  Surgical pcr screen     Status: None   Collection Time: 02/21/17  1:48 PM  Result Value Ref Range   MRSA, PCR NEGATIVE NEGATIVE   Staphylococcus aureus NEGATIVE NEGATIVE    Comment: (NOTE) The Xpert SA Assay (FDA approved for NASAL specimens in patients 21 years of age and older), is one component of a comprehensive surveillance program. It is not intended to diagnose infection nor to guide or monitor treatment.   Urinalysis, Routine w reflex microscopic     Status: Abnormal   Collection Time: 02/21/17  1:48 PM  Result Value Ref Range   Color, Urine YELLOW YELLOW   APPearance HAZY (A) CLEAR   Specific Gravity, Urine 1.021 1.005 - 1.030   pH 6.0 5.0 - 8.0   Glucose, UA NEGATIVE NEGATIVE mg/dL   Hgb urine dipstick NEGATIVE NEGATIVE    Bilirubin Urine NEGATIVE NEGATIVE   Ketones, ur NEGATIVE NEGATIVE mg/dL   Protein, ur NEGATIVE NEGATIVE mg/dL   Nitrite NEGATIVE NEGATIVE   Leukocytes, UA NEGATIVE NEGATIVE  APTT     Status: None   Collection Time: 02/21/17  1:49 PM  Result Value Ref Range   aPTT 28 24 - 36  seconds  CBC     Status: Abnormal   Collection Time: 02/21/17  1:49 PM  Result Value Ref Range   WBC 10.3 4.0 - 10.5 K/uL   RBC 5.42 (H) 3.87 - 5.11 MIL/uL   Hemoglobin 15.3 (H) 12.0 - 15.0 g/dL   HCT 46.1 (H) 36.0 - 46.0 %   MCV 85.1 78.0 - 100.0 fL   MCH 28.2 26.0 - 34.0 pg   MCHC 33.2 30.0 - 36.0 g/dL   RDW 14.7 11.5 - 15.5 %   Platelets 357 150 - 400 K/uL  Comprehensive metabolic panel     Status: Abnormal   Collection Time: 02/21/17  1:49 PM  Result Value Ref Range   Sodium 135 135 - 145 mmol/L   Potassium 4.1 3.5 - 5.1 mmol/L   Chloride 100 (L) 101 - 111 mmol/L   CO2 26 22 - 32 mmol/L   Glucose, Bld 85 65 - 99 mg/dL   BUN 20 6 - 20 mg/dL   Creatinine, Ser 0.80 0.44 - 1.00 mg/dL   Calcium 10.0 8.9 - 10.3 mg/dL   Total Protein 7.6 6.5 - 8.1 g/dL   Albumin 4.2 3.5 - 5.0 g/dL   AST 20 15 - 41 U/L   ALT 17 14 - 54 U/L   Alkaline Phosphatase 109 38 - 126 U/L   Total Bilirubin 0.4 0.3 - 1.2 mg/dL   GFR calc non Af Amer >60 >60 mL/min   GFR calc Af Amer >60 >60 mL/min    Comment: (NOTE) The eGFR has been calculated using the CKD EPI equation. This calculation has not been validated in all clinical situations. eGFR's persistently <60 mL/min signify possible Chronic Kidney Disease.    Anion gap 9 5 - 15  Protime-INR     Status: None   Collection Time: 02/21/17  1:49 PM  Result Value Ref Range   Prothrombin Time 12.9 11.4 - 15.2 seconds   INR 0.98   Type and screen [place]     Status: None   Collection Time: 02/21/17  2:06 PM  Result Value Ref Range   ABO/RH(D) O NEG    Antibody Screen NEG    Sample Expiration 03/07/2017    Extend sample reason NO TRANSFUSIONS OR PREGNANCY  IN THE PAST 3 MONTHS    No results found.  Review of Systems  Constitutional: Negative.   HENT: Negative.   Respiratory: Negative.   Cardiovascular: Negative.   Genitourinary: Negative.   Musculoskeletal: Positive for back pain.  Skin: Negative.   Neurological: Positive for tingling.  Psychiatric/Behavioral: Negative.     There were no vitals taken for this visit. Physical Exam  Constitutional: She is oriented to person, place, and time. She appears well-nourished. No distress.  HENT:  Head: Normocephalic and atraumatic.  Eyes: Pupils are equal, round, and reactive to light.  Respiratory: Effort normal. No respiratory distress.  GI: She exhibits no distension.  Musculoskeletal:  Ortho Exam well-healed lumbar incision posteriorly..  Knee and ankle jerk are intact.  Pulses are intact.  No pain with hip range of motion.  Tenderness of both greater trochanteric region worse on the right than left.  Reverse straight leg raising.  No quad anterior tib or gastrocsoleus weakness.  Neurological: She is alert and oriented to person, place, and time.  Skin: Skin is warm and dry.  Psychiatric: She has a normal mood and affect.     Assessment/Plan L3-4 stenosis, low back pain and claudication   We will  proceed with L3-4 RIGHT TRANSFORAMINAL LUMBAR INTERBODY FUSION, REMOVAL RODS L4-S1, PEDICLE INSTRUMENTATION as scheduled. Surgical procedure along with possible risks and complications discussed. All questions answered.  Benjiman Core, PA-C 02/22/2017, 5:10 PM

## 2017-02-23 ENCOUNTER — Inpatient Hospital Stay (HOSPITAL_COMMUNITY): Payer: Managed Care, Other (non HMO)

## 2017-02-23 ENCOUNTER — Encounter (HOSPITAL_COMMUNITY): Payer: Self-pay | Admitting: General Practice

## 2017-02-23 ENCOUNTER — Encounter (HOSPITAL_COMMUNITY)
Admission: RE | Disposition: A | Discharge status: Home / Self Care | Payer: Self-pay | Source: Ambulatory Visit | Attending: Orthopaedic Surgery

## 2017-02-23 ENCOUNTER — Other Ambulatory Visit: Payer: Self-pay

## 2017-02-23 ENCOUNTER — Inpatient Hospital Stay (HOSPITAL_COMMUNITY): Payer: Managed Care, Other (non HMO) | Admitting: Anesthesiology

## 2017-02-23 ENCOUNTER — Inpatient Hospital Stay (HOSPITAL_COMMUNITY)
Admission: RE | Admit: 2017-02-23 | Discharge: 2017-02-24 | DRG: 460 | Disposition: A | Discharge status: Home / Self Care | Payer: Managed Care, Other (non HMO) | Source: Ambulatory Visit | Attending: Orthopaedic Surgery | Admitting: Orthopaedic Surgery

## 2017-02-23 ENCOUNTER — Telehealth: Payer: Self-pay | Admitting: *Deleted

## 2017-02-23 DIAGNOSIS — M797 Fibromyalgia: Secondary | ICD-10-CM

## 2017-02-23 DIAGNOSIS — Z981 Arthrodesis status: Secondary | ICD-10-CM | POA: Diagnosis not present

## 2017-02-23 DIAGNOSIS — M810 Age-related osteoporosis without current pathological fracture: Secondary | ICD-10-CM | POA: Diagnosis present

## 2017-02-23 DIAGNOSIS — M05741 Rheumatoid arthritis with rheumatoid factor of right hand without organ or systems involvement: Secondary | ICD-10-CM

## 2017-02-23 DIAGNOSIS — I10 Essential (primary) hypertension: Secondary | ICD-10-CM | POA: Diagnosis present

## 2017-02-23 DIAGNOSIS — M05742 Rheumatoid arthritis with rheumatoid factor of left hand without organ or systems involvement: Principal | ICD-10-CM

## 2017-02-23 DIAGNOSIS — M069 Rheumatoid arthritis, unspecified: Secondary | ICD-10-CM | POA: Diagnosis present

## 2017-02-23 DIAGNOSIS — S129XXD Fracture of neck, unspecified, subsequent encounter: Secondary | ICD-10-CM

## 2017-02-23 DIAGNOSIS — Z87891 Personal history of nicotine dependence: Secondary | ICD-10-CM | POA: Diagnosis not present

## 2017-02-23 DIAGNOSIS — M5136 Other intervertebral disc degeneration, lumbar region: Secondary | ICD-10-CM | POA: Diagnosis not present

## 2017-02-23 DIAGNOSIS — G894 Chronic pain syndrome: Secondary | ICD-10-CM

## 2017-02-23 DIAGNOSIS — R7303 Prediabetes: Secondary | ICD-10-CM | POA: Diagnosis present

## 2017-02-23 DIAGNOSIS — Z419 Encounter for procedure for purposes other than remedying health state, unspecified: Secondary | ICD-10-CM

## 2017-02-23 DIAGNOSIS — R5382 Chronic fatigue, unspecified: Secondary | ICD-10-CM

## 2017-02-23 DIAGNOSIS — M545 Low back pain: Secondary | ICD-10-CM | POA: Diagnosis present

## 2017-02-23 DIAGNOSIS — G9332 Myalgic encephalomyelitis/chronic fatigue syndrome: Secondary | ICD-10-CM

## 2017-02-23 DIAGNOSIS — M48062 Spinal stenosis, lumbar region with neurogenic claudication: Principal | ICD-10-CM | POA: Diagnosis present

## 2017-02-23 DIAGNOSIS — M48061 Spinal stenosis, lumbar region without neurogenic claudication: Secondary | ICD-10-CM | POA: Diagnosis present

## 2017-02-23 DIAGNOSIS — M5126 Other intervertebral disc displacement, lumbar region: Secondary | ICD-10-CM

## 2017-02-23 HISTORY — DX: Low back pain: M54.5

## 2017-02-23 HISTORY — PX: MAXIMUM ACCESS (MAS) TRANSFORAMINAL LUMBAR INTERBODY FUSION (TLIF) 2 LEVEL: SHX6393

## 2017-02-23 HISTORY — DX: Low back pain, unspecified: M54.50

## 2017-02-23 HISTORY — DX: Other chronic pain: G89.29

## 2017-02-23 HISTORY — DX: Rheumatoid arthritis, unspecified: M06.9

## 2017-02-23 HISTORY — DX: Unspecified asthma, uncomplicated: J45.909

## 2017-02-23 SURGERY — POSTERIOR LUMBAR FUSION 1 WITH HARDWARE REMOVAL
Anesthesia: General

## 2017-02-23 MED ORDER — HYDROMORPHONE HCL 1 MG/ML IJ SOLN
INTRAMUSCULAR | Status: AC
  Administered 2017-02-23: 0.5 mg via INTRAVENOUS
  Filled 2017-02-23: qty 1

## 2017-02-23 MED ORDER — VECURONIUM BROMIDE 10 MG IV SOLR
INTRAVENOUS | Status: DC | PRN
  Administered 2017-02-23: 1 mg via INTRAVENOUS

## 2017-02-23 MED ORDER — ACETAMINOPHEN 325 MG PO TABS: 650.0000 mg | ORAL_TABLET | ORAL | Status: DC | PRN

## 2017-02-23 MED ORDER — ONDANSETRON HCL 4 MG/2ML IJ SOLN: 4.0000 mg | Freq: Four times a day (QID) | INTRAMUSCULAR | Status: DC | PRN

## 2017-02-23 MED ORDER — AMPHETAMINE-DEXTROAMPHET ER 10 MG PO CP24
60.0000 mg | ORAL_CAPSULE | Freq: Every day | ORAL | Status: DC
  Administered 2017-02-23 – 2017-02-24 (×2): 60 mg via ORAL
  Filled 2017-02-23 (×2): qty 6

## 2017-02-23 MED ORDER — HYDROMORPHONE HCL 1 MG/ML IJ SOLN
0.5000 mg | INTRAMUSCULAR | Status: DC | PRN
  Administered 2017-02-23 – 2017-02-24 (×6): 1 mg via INTRAVENOUS
  Filled 2017-02-23 (×6): qty 1

## 2017-02-23 MED ORDER — FENTANYL CITRATE (PF) 250 MCG/5ML IJ SOLN
INTRAMUSCULAR | Status: AC
  Filled 2017-02-23: qty 5

## 2017-02-23 MED ORDER — EPHEDRINE SULFATE 50 MG/ML IJ SOLN
INTRAMUSCULAR | Status: DC | PRN
  Administered 2017-02-23 (×3): 5 mg via INTRAVENOUS
  Administered 2017-02-23 (×2): 10 mg via INTRAVENOUS

## 2017-02-23 MED ORDER — ONDANSETRON HCL 4 MG PO TABS: 4.0000 mg | ORAL_TABLET | Freq: Four times a day (QID) | ORAL | Status: DC | PRN

## 2017-02-23 MED ORDER — PHENYLEPHRINE 40 MCG/ML (10ML) SYRINGE FOR IV PUSH (FOR BLOOD PRESSURE SUPPORT)
PREFILLED_SYRINGE | INTRAVENOUS | Status: AC
  Filled 2017-02-23: qty 10

## 2017-02-23 MED ORDER — SODIUM CHLORIDE 0.9 % IV SOLN
INTRAVENOUS | Status: DC
  Administered 2017-02-23 – 2017-02-24 (×2): via INTRAVENOUS

## 2017-02-23 MED ORDER — OXYCODONE HCL 5 MG PO TABS
5.0000 mg | ORAL_TABLET | ORAL | Status: DC | PRN
  Administered 2017-02-23 – 2017-02-24 (×5): 10 mg via ORAL
  Filled 2017-02-23 (×4): qty 2

## 2017-02-23 MED ORDER — METHOCARBAMOL 1000 MG/10ML IJ SOLN
500.0000 mg | Freq: Four times a day (QID) | INTRAMUSCULAR | Status: DC | PRN
  Filled 2017-02-23: qty 5

## 2017-02-23 MED ORDER — SUGAMMADEX SODIUM 200 MG/2ML IV SOLN
INTRAVENOUS | Status: DC | PRN
  Administered 2017-02-23: 150 mg via INTRAVENOUS

## 2017-02-23 MED ORDER — THROMBIN (RECOMBINANT) 5000 UNITS EX SOLR
CUTANEOUS | Status: AC
  Filled 2017-02-23: qty 10000

## 2017-02-23 MED ORDER — METHOCARBAMOL 500 MG PO TABS
ORAL_TABLET | ORAL | Status: AC
  Filled 2017-02-23: qty 1

## 2017-02-23 MED ORDER — ONDANSETRON HCL 4 MG/2ML IJ SOLN
INTRAMUSCULAR | Status: AC
  Filled 2017-02-23: qty 2

## 2017-02-23 MED ORDER — ALBUMIN HUMAN 5 % IV SOLN
INTRAVENOUS | Status: DC | PRN
  Administered 2017-02-23 (×2): via INTRAVENOUS

## 2017-02-23 MED ORDER — VITAMIN B-6 100 MG PO TABS
100.0000 mg | ORAL_TABLET | Freq: Two times a day (BID) | ORAL | Status: DC
  Administered 2017-02-23 – 2017-02-24 (×2): 100 mg via ORAL
  Filled 2017-02-23 (×3): qty 1

## 2017-02-23 MED ORDER — ROCURONIUM BROMIDE 100 MG/10ML IV SOLN
INTRAVENOUS | Status: DC | PRN
  Administered 2017-02-23: 20 mg via INTRAVENOUS
  Administered 2017-02-23: 50 mg via INTRAVENOUS
  Administered 2017-02-23: 10 mg via INTRAVENOUS
  Administered 2017-02-23: 20 mg via INTRAVENOUS

## 2017-02-23 MED ORDER — ALPRAZOLAM 0.5 MG PO TABS
1.0000 mg | ORAL_TABLET | Freq: Three times a day (TID) | ORAL | Status: DC | PRN
  Administered 2017-02-24 (×2): 1 mg via ORAL
  Filled 2017-02-23 (×2): qty 2

## 2017-02-23 MED ORDER — LIDOCAINE HCL (CARDIAC) 20 MG/ML IV SOLN
INTRAVENOUS | Status: DC | PRN
Start: 2017-02-23 — End: 2017-02-23
  Administered 2017-02-23: 40 mg via INTRAVENOUS
  Administered 2017-02-23: 60 mg via INTRAVENOUS

## 2017-02-23 MED ORDER — 0.9 % SODIUM CHLORIDE (POUR BTL) OPTIME
TOPICAL | Status: DC | PRN
  Administered 2017-02-23: 1000 mL

## 2017-02-23 MED ORDER — DOCUSATE SODIUM 100 MG PO CAPS
100.0000 mg | ORAL_CAPSULE | Freq: Two times a day (BID) | ORAL | Status: DC
  Administered 2017-02-23 – 2017-02-24 (×3): 100 mg via ORAL
  Filled 2017-02-23 (×3): qty 1

## 2017-02-23 MED ORDER — CEFAZOLIN SODIUM-DEXTROSE 1-4 GM/50ML-% IV SOLN
1.0000 g | Freq: Three times a day (TID) | INTRAVENOUS | Status: AC
  Administered 2017-02-23 – 2017-02-24 (×2): 1 g via INTRAVENOUS
  Filled 2017-02-23 (×2): qty 50

## 2017-02-23 MED ORDER — SODIUM CHLORIDE 0.9 % IV SOLN
INTRAVENOUS | Status: DC | PRN
  Administered 2017-02-23: 12:00:00 via INTRAVENOUS

## 2017-02-23 MED ORDER — ARTIFICIAL TEARS OPHTHALMIC OINT
TOPICAL_OINTMENT | OPHTHALMIC | Status: AC
  Filled 2017-02-23: qty 3.5

## 2017-02-23 MED ORDER — MENTHOL 3 MG MT LOZG: 1.0000 | LOZENGE | OROMUCOSAL | Status: DC | PRN

## 2017-02-23 MED ORDER — PROPOFOL 10 MG/ML IV BOLUS
INTRAVENOUS | Status: AC
  Filled 2017-02-23: qty 40

## 2017-02-23 MED ORDER — CEFAZOLIN SODIUM 1 G IJ SOLR
INTRAMUSCULAR | Status: AC
Start: 2017-02-23 — End: 2017-02-23
  Filled 2017-02-23: qty 20

## 2017-02-23 MED ORDER — SODIUM CHLORIDE 0.9 % IV SOLN: 250.0000 mL | INTRAVENOUS | Status: DC

## 2017-02-23 MED ORDER — PHENYLEPHRINE HCL 10 MG/ML IJ SOLN
INTRAMUSCULAR | Status: DC | PRN
  Administered 2017-02-23 (×2): 80 ug via INTRAVENOUS
  Administered 2017-02-23 (×3): 40 ug via INTRAVENOUS

## 2017-02-23 MED ORDER — CHLORHEXIDINE GLUCONATE 4 % EX LIQD: 60.0000 mL | Freq: Once | CUTANEOUS | Status: DC

## 2017-02-23 MED ORDER — OXYCODONE-ACETAMINOPHEN 10-325 MG PO TABS: 1.0000 | ORAL_TABLET | Freq: Four times a day (QID) | ORAL | 0 refills | Status: DC | PRN

## 2017-02-23 MED ORDER — LACTATED RINGERS IV SOLN
INTRAVENOUS | Status: DC | PRN
  Administered 2017-02-23 (×2): via INTRAVENOUS

## 2017-02-23 MED ORDER — SODIUM CHLORIDE 0.9% FLUSH
3.0000 mL | INTRAVENOUS | Status: DC | PRN
  Administered 2017-02-24: 3 mL via INTRAVENOUS
  Filled 2017-02-23: qty 3

## 2017-02-23 MED ORDER — SODIUM CHLORIDE 0.9% FLUSH: 3.0000 mL | Freq: Two times a day (BID) | INTRAVENOUS | Status: DC

## 2017-02-23 MED ORDER — DEXAMETHASONE SODIUM PHOSPHATE 10 MG/ML IJ SOLN
INTRAMUSCULAR | Status: AC
  Filled 2017-02-23: qty 1

## 2017-02-23 MED ORDER — ONDANSETRON HCL 4 MG/2ML IJ SOLN
INTRAMUSCULAR | Status: DC | PRN
  Administered 2017-02-23: 4 mg via INTRAVENOUS

## 2017-02-23 MED ORDER — OXYCODONE HCL 5 MG PO TABS
ORAL_TABLET | ORAL | Status: AC
  Filled 2017-02-23: qty 2

## 2017-02-23 MED ORDER — BUPIVACAINE HCL (PF) 0.25 % IJ SOLN
INTRAMUSCULAR | Status: AC
  Filled 2017-02-23: qty 30

## 2017-02-23 MED ORDER — ARTIFICIAL TEARS OPHTHALMIC OINT
TOPICAL_OINTMENT | OPHTHALMIC | Status: DC | PRN
  Administered 2017-02-23: 1 via OPHTHALMIC

## 2017-02-23 MED ORDER — THROMBIN (RECOMBINANT) 5000 UNITS EX SOLR
CUTANEOUS | Status: DC | PRN
  Administered 2017-02-23: 10000 [IU] via TOPICAL

## 2017-02-23 MED ORDER — DEXAMETHASONE SODIUM PHOSPHATE 10 MG/ML IJ SOLN
INTRAMUSCULAR | Status: DC | PRN
  Administered 2017-02-23: 10 mg via INTRAVENOUS

## 2017-02-23 MED ORDER — CEFAZOLIN SODIUM-DEXTROSE 2-4 GM/100ML-% IV SOLN
INTRAVENOUS | Status: AC
  Filled 2017-02-23: qty 100

## 2017-02-23 MED ORDER — METHOCARBAMOL 500 MG PO TABS: 500.0000 mg | ORAL_TABLET | Freq: Four times a day (QID) | ORAL | 0 refills | Status: DC | PRN

## 2017-02-23 MED ORDER — CEFAZOLIN SODIUM-DEXTROSE 2-4 GM/100ML-% IV SOLN
2.0000 g | INTRAVENOUS | Status: AC
  Administered 2017-02-23 (×2): 2 g via INTRAVENOUS

## 2017-02-23 MED ORDER — HYDROMORPHONE HCL 1 MG/ML IJ SOLN
0.2500 mg | INTRAMUSCULAR | Status: DC | PRN
  Administered 2017-02-23: 0.5 mg via INTRAVENOUS
  Administered 2017-02-23: 1 mg via INTRAVENOUS
  Administered 2017-02-23: 0.5 mg via INTRAVENOUS

## 2017-02-23 MED ORDER — EPHEDRINE 5 MG/ML INJ
INTRAVENOUS | Status: AC
  Filled 2017-02-23: qty 10

## 2017-02-23 MED ORDER — ACETAMINOPHEN 650 MG RE SUPP: 650.0000 mg | RECTAL | Status: DC | PRN

## 2017-02-23 MED ORDER — ROCURONIUM BROMIDE 10 MG/ML (PF) SYRINGE
PREFILLED_SYRINGE | INTRAVENOUS | Status: AC
  Filled 2017-02-23: qty 5

## 2017-02-23 MED ORDER — SUGAMMADEX SODIUM 200 MG/2ML IV SOLN
INTRAVENOUS | Status: AC
  Filled 2017-02-23: qty 2

## 2017-02-23 MED ORDER — POLYETHYLENE GLYCOL 3350 17 G PO PACK: 17.0000 g | PACK | Freq: Every day | ORAL | Status: DC | PRN

## 2017-02-23 MED ORDER — MIDAZOLAM HCL 2 MG/2ML IJ SOLN
INTRAMUSCULAR | Status: AC
  Filled 2017-02-23: qty 2

## 2017-02-23 MED ORDER — PROPOFOL 10 MG/ML IV BOLUS
INTRAVENOUS | Status: DC | PRN
  Administered 2017-02-23: 120 mg via INTRAVENOUS

## 2017-02-23 MED ORDER — METHOCARBAMOL 500 MG PO TABS
500.0000 mg | ORAL_TABLET | Freq: Four times a day (QID) | ORAL | Status: DC | PRN
  Administered 2017-02-23 – 2017-02-24 (×4): 500 mg via ORAL
  Filled 2017-02-23 (×3): qty 1

## 2017-02-23 MED ORDER — VENLAFAXINE HCL ER 75 MG PO CP24
225.0000 mg | ORAL_CAPSULE | Freq: Every day | ORAL | Status: DC
  Administered 2017-02-24: 225 mg via ORAL
  Filled 2017-02-23: qty 1

## 2017-02-23 MED ORDER — FENTANYL CITRATE (PF) 100 MCG/2ML IJ SOLN
INTRAMUSCULAR | Status: DC | PRN
  Administered 2017-02-23 (×3): 50 ug via INTRAVENOUS
  Administered 2017-02-23: 100 ug via INTRAVENOUS
  Administered 2017-02-23 (×5): 50 ug via INTRAVENOUS

## 2017-02-23 MED ORDER — MIDAZOLAM HCL 5 MG/5ML IJ SOLN
INTRAMUSCULAR | Status: DC | PRN
  Administered 2017-02-23: 2 mg via INTRAVENOUS

## 2017-02-23 MED ORDER — PHENYLEPHRINE HCL 10 MG/ML IJ SOLN
INTRAVENOUS | Status: DC | PRN
  Administered 2017-02-23: 10 ug/min via INTRAVENOUS

## 2017-02-23 MED ORDER — VITAMIN D 1000 UNITS PO TABS
1000.0000 [IU] | ORAL_TABLET | Freq: Every day | ORAL | Status: DC
  Administered 2017-02-23 – 2017-02-24 (×2): 1000 [IU] via ORAL
  Filled 2017-02-23 (×2): qty 1

## 2017-02-23 MED ORDER — LISINOPRIL 10 MG PO TABS: 10.0000 mg | ORAL_TABLET | Freq: Every day | ORAL | Status: DC | PRN

## 2017-02-23 MED ORDER — LIDOCAINE 2% (20 MG/ML) 5 ML SYRINGE
INTRAMUSCULAR | Status: AC
  Filled 2017-02-23: qty 5

## 2017-02-23 MED ORDER — PHENOL 1.4 % MT LIQD: 1.0000 | OROMUCOSAL | Status: DC | PRN

## 2017-02-23 MED FILL — Heparin Sodium (Porcine) Inj 1000 Unit/ML: INTRAMUSCULAR | Qty: 30 | Status: AC

## 2017-02-23 MED FILL — Sodium Chloride IV Soln 0.9%: INTRAVENOUS | Qty: 1000 | Status: AC

## 2017-02-23 SURGICAL SUPPLY — 67 items
BANDAGE ACE 6X5 VEL STRL LF (GAUZE/BANDAGES/DRESSINGS) IMPLANT
BLADE CLIPPER SURG (BLADE) IMPLANT
BLADE SURG 10 STRL SS (BLADE) ×2 IMPLANT
BUR ROUND FLUTED 4 SOFT TCH (BURR) ×2 IMPLANT
CAP SPINAL LOCKING TI (Cap) ×18 IMPLANT
CORDS BIPOLAR (ELECTRODE) ×2 IMPLANT
COVER BACK TABLE 80X110 HD (DRAPES) ×2 IMPLANT
COVER SURGICAL LIGHT HANDLE (MISCELLANEOUS) ×2 IMPLANT
DERMABOND ADVANCED (GAUZE/BANDAGES/DRESSINGS)
DERMABOND ADVANCED .7 DNX12 (GAUZE/BANDAGES/DRESSINGS) IMPLANT
DRAPE C-ARM 42X72 X-RAY (DRAPES) ×2 IMPLANT
DRAPE C-ARMOR (DRAPES) ×2 IMPLANT
DRAPE HALF SHEET 40X57 (DRAPES) ×8 IMPLANT
DRAPE MICROSCOPE LEICA (MISCELLANEOUS) ×2 IMPLANT
DRAPE ORTHO SPLIT 77X108 STRL (DRAPES) ×1
DRAPE SURG 17X23 STRL (DRAPES) ×6 IMPLANT
DRAPE SURG ORHT 6 SPLT 77X108 (DRAPES) ×1 IMPLANT
DRSG EMULSION OIL 3X3 NADH (GAUZE/BANDAGES/DRESSINGS) ×2 IMPLANT
DRSG MEPILEX BORDER 4X4 (GAUZE/BANDAGES/DRESSINGS) IMPLANT
DRSG MEPILEX BORDER 4X8 (GAUZE/BANDAGES/DRESSINGS) ×2 IMPLANT
DRSG PAD ABDOMINAL 8X10 ST (GAUZE/BANDAGES/DRESSINGS) ×4 IMPLANT
DURAPREP 26ML APPLICATOR (WOUND CARE) ×2 IMPLANT
ELECT BLADE 4.0 EZ CLEAN MEGAD (MISCELLANEOUS) ×2
ELECT CAUTERY BLADE 6.4 (BLADE) ×2 IMPLANT
ELECT REM PT RETURN 9FT ADLT (ELECTROSURGICAL) ×2
ELECTRODE BLDE 4.0 EZ CLN MEGD (MISCELLANEOUS) ×1 IMPLANT
ELECTRODE REM PT RTRN 9FT ADLT (ELECTROSURGICAL) ×1 IMPLANT
EVACUATOR 1/8 PVC DRAIN (DRAIN) IMPLANT
GLOVE BIOGEL PI IND STRL 8 (GLOVE) ×2 IMPLANT
GLOVE BIOGEL PI INDICATOR 8 (GLOVE) ×2
GLOVE ORTHO TXT STRL SZ7.5 (GLOVE) ×4 IMPLANT
GOWN STRL REUS W/ TWL LRG LVL3 (GOWN DISPOSABLE) ×1 IMPLANT
GOWN STRL REUS W/ TWL XL LVL3 (GOWN DISPOSABLE) ×1 IMPLANT
GOWN STRL REUS W/TWL 2XL LVL3 (GOWN DISPOSABLE) ×2 IMPLANT
GOWN STRL REUS W/TWL LRG LVL3 (GOWN DISPOSABLE) ×1
GOWN STRL REUS W/TWL XL LVL3 (GOWN DISPOSABLE) ×1
HEMOSTAT SURGICEL 2X14 (HEMOSTASIS) IMPLANT
KIT BASIN OR (CUSTOM PROCEDURE TRAY) ×2 IMPLANT
KIT ROOM TURNOVER OR (KITS) ×2 IMPLANT
MANIFOLD NEPTUNE II (INSTRUMENTS) ×2 IMPLANT
NEEDLE HYPO 25X1 1.5 SAFETY (NEEDLE) ×2 IMPLANT
NEEDLE SPNL 18GX3.5 QUINCKE PK (NEEDLE) ×6 IMPLANT
NS IRRIG 1000ML POUR BTL (IV SOLUTION) ×2 IMPLANT
PACK LAMINECTOMY ORTHO (CUSTOM PROCEDURE TRAY) ×2 IMPLANT
PAD ARMBOARD 7.5X6 YLW CONV (MISCELLANEOUS) ×4 IMPLANT
PATTIES SURGICAL .5 X.5 (GAUZE/BANDAGES/DRESSINGS) ×4 IMPLANT
PATTIES SURGICAL .75X.75 (GAUZE/BANDAGES/DRESSINGS) IMPLANT
PATTIES SURGICAL 1X1 (DISPOSABLE) ×2 IMPLANT
ROD MATRIX 80MM (Rod) ×4 IMPLANT
SCREW MATRIX MIS 6.0X40MM (Screw) ×4 IMPLANT
SPACER TPAL 10X8X28MM (Spacer) ×2 IMPLANT
SPONGE LAP 18X18 X RAY DECT (DISPOSABLE) IMPLANT
SPONGE LAP 4X18 X RAY DECT (DISPOSABLE) ×4 IMPLANT
SPONGE SURGIFOAM ABS GEL SZ50 (HEMOSTASIS) IMPLANT
STAPLER VISISTAT 35W (STAPLE) IMPLANT
SURGIFLO W/THROMBIN 8M KIT (HEMOSTASIS) ×2 IMPLANT
SUT BONE WAX W31G (SUTURE) ×4 IMPLANT
SUT VIC AB 1 CTX 36 (SUTURE)
SUT VIC AB 1 CTX36XBRD ANBCTR (SUTURE) IMPLANT
SUT VIC AB 2-0 CT1 27 (SUTURE) ×1
SUT VIC AB 2-0 CT1 TAPERPNT 27 (SUTURE) ×1 IMPLANT
SUT VIC AB 3-0 X1 27 (SUTURE) ×2 IMPLANT
TOWEL OR 17X24 6PK STRL BLUE (TOWEL DISPOSABLE) ×2 IMPLANT
TOWEL OR 17X26 10 PK STRL BLUE (TOWEL DISPOSABLE) ×2 IMPLANT
TRAY FOLEY W/METER SILVER 16FR (SET/KITS/TRAYS/PACK) ×2 IMPLANT
WATER STERILE IRR 1000ML POUR (IV SOLUTION) ×2 IMPLANT
YANKAUER SUCT BULB TIP NO VENT (SUCTIONS) ×2 IMPLANT

## 2017-02-23 NOTE — Evaluation (Signed)
Physical Therapy Evaluation Patient Details Name: Kara [name] MRN: [id number] DOB: 01/07/1956 Today's Date: 02/23/2017   History of Present Illness  Pt is a 61 y/o female s/p L3-4 TLIF. PMH includes anxiety, fibromyalgia, HTN, pre-diabetes, depression, RA, ACDF, and lumbar fusion.   Clinical Impression  Pt is s/p surgery above with deficits below. PTA, pt was independent with functional mobility. Upon eval, pt presenting with post op pain and slight unsteadiness, however, tolerated gait training very well. Required min guard for mobility and use of IV pole for steadying. Reports husband will be able to assist with mobility at d/c. No follow up PT recommended at d/c, however, once cleared by MD would benefit from outpatient PT. Will continue to follow acutely to maximize functional mobility independence and safety.     Follow Up Recommendations No PT follow up;Supervision for mobility/OOB;Other (comment)(would benefit from outpatient PT once cleared by MD )    Equipment Recommendations  None recommended by PT    Recommendations for Other Services       Precautions / Restrictions Precautions Precautions: Back Precaution Booklet Issued: No Precaution Comments: Verbally reviewed back precautions with pt.  Required Braces or Orthoses: Spinal Brace Spinal Brace: Lumbar corset;Applied in standing position Restrictions Weight Bearing Restrictions: No      Mobility  Bed Mobility Overal bed mobility: Needs Assistance Bed Mobility: Rolling;Sidelying to Sit Rolling: Supervision Sidelying to sit: Supervision       General bed mobility comments: Supervision to ensure log roll technique.   Transfers Overall transfer level: Needs assistance Equipment used: None Transfers: Sit to/from Stand Sit to Stand: Min guard         General transfer comment: Min guard for safety.   Ambulation/Gait Ambulation/Gait assistance: Min guard Ambulation Distance (Feet): 200 Feet Assistive device:  (IV pole) Gait Pattern/deviations: Step-through pattern;Decreased stride length Gait velocity: Decreased Gait velocity interpretation: Below normal speed for age/gender General Gait Details: Guarded, slightly unsteady gait. Required min guard for steadying assist and use of IV pole. Educated about generalized walking program to perform at home.   Stairs            Wheelchair Mobility    Modified Rankin (Stroke Patients Only)       Balance Overall balance assessment: Needs assistance Sitting-balance support: No upper extremity supported;Feet supported Sitting balance-Leahy Scale: Good     Standing balance support: Single extremity supported;No upper extremity supported;During functional activity Standing balance-Leahy Scale: Fair Standing balance comment: Able to maintain static standing without UE support                              Pertinent Vitals/Pain Pain Assessment: Faces Faces Pain Scale: Hurts little more Pain Location: back  Pain Descriptors / Indicators: Aching;Operative site guarding Pain Intervention(s): Limited activity within patient's tolerance;Monitored during session;Repositioned    Home Living Family/patient expects to be discharged to:: Private residence Living Arrangements: Spouse/significant other Available Help at Discharge: Family;Available 24 hours/day Type of Home: House Home Access: Stairs to enter   [employer] of Steps: 2 Home Layout: One level Home Equipment: None      Prior Function Level of Independence: Independent               Hand Dominance   Dominant Hand: Right    Extremity/Trunk Assessment   Upper Extremity Assessment Upper Extremity Assessment: Defer to OT evaluation    Lower Extremity Assessment Lower Extremity Assessment: Overall WFL for tasks assessed  Cervical / Trunk Assessment Cervical / Trunk Assessment: Other exceptions Cervical / Trunk Exceptions: s/p TLIF    Communication   Communication: No difficulties  Cognition Arousal/Alertness: Awake/alert Behavior During Therapy: WFL for tasks assessed/performed Overall Cognitive Status: Within Functional Limits for tasks assessed                                        General Comments General comments (skin integrity, edema, etc.): Pt's husband present during session.     Exercises     Assessment/Plan    PT Assessment Patient needs continued PT services  PT Problem List Decreased balance;Decreased mobility;Decreased knowledge of precautions;Pain       PT Treatment Interventions Gait training;Stair training;Functional mobility training;Therapeutic activities;Balance training;Therapeutic exercise;Neuromuscular re-education;Patient/family education    PT Goals (Current goals can be found in the Care Plan section)  Acute Rehab PT Goals Patient Stated Goal: to go home  PT Goal Formulation: With patient Time For Goal Achievement: 03/02/17 Potential to Achieve Goals: Good    Frequency Min 5X/week   Barriers to discharge        Co-evaluation               AM-PAC PT "6 Clicks" Daily Activity  Outcome Measure Difficulty turning over in bed (including adjusting bedclothes, sheets and blankets)?: None Difficulty moving from lying on back to sitting on the side of the bed? : A Little Difficulty sitting down on and standing up from a chair with arms (e.g., wheelchair, bedside commode, etc,.)?: Unable Help needed moving to and from a bed to chair (including a wheelchair)?: A Little Help needed walking in hospital room?: A Little Help needed climbing 3-5 steps with a railing? : A Little 6 Click Score: 17    End of Session Equipment Utilized During Treatment: Gait belt;Back brace Activity Tolerance: Patient tolerated treatment well Patient left: in chair;with call bell/phone within reach;with family/visitor present Nurse Communication: Mobility status PT Visit  Diagnosis: Unsteadiness on feet (R26.81);Other abnormalities of gait and mobility (R26.89);Pain Pain - part of body: (back)    Time: 8676-1950 PT Time Calculation (min) (ACUTE ONLY): 19 min   Charges:   PT Evaluation $PT Eval Low Complexity: 1 Low     PT G Codes:        Gladys Damme, PT, DPT  Acute Rehabilitation Services  Pager: [phone]   Lehman Prom 02/23/2017, 5:25 PM

## 2017-02-23 NOTE — Progress Notes (Signed)
Patient ID: Kara [name], female   DOB: 02-Nov-1955, 61 y.o.   MRN: [id number] Post op check ,   Walking in hall , no leg pain , had marcaine local in incision and may have more incisional pain later. She asked could she go home Sat. And if she is doing as well Sat then fine to discharge. Rx placed on chart percocet and robaxin.

## 2017-02-23 NOTE — Anesthesia Procedure Notes (Signed)
Procedure Name: Intubation Date/Time: 02/23/2017 7:46 AM Performed by: Fransisca Kaufmann, CRNA Pre-anesthesia Checklist: Patient identified, Emergency Drugs available, Suction available and Patient being monitored Patient Re-evaluated:Patient Re-evaluated prior to induction Oxygen Delivery Method: Circle System Utilized Preoxygenation: Pre-oxygenation with 100% oxygen Induction Type: IV induction Ventilation: Mask ventilation without difficulty Laryngoscope Size: Miller and 2 Grade View: Grade II Tube type: Oral Tube size: 7.0 mm Number of attempts: 1 Airway Equipment and Method: Stylet and Oral airway Placement Confirmation: ETT inserted through vocal cords under direct vision,  positive ETCO2 and breath sounds checked- equal and bilateral Secured at: 21 cm Tube secured with: Tape Dental Injury: Teeth and Oropharynx as per pre-operative assessment

## 2017-02-23 NOTE — Progress Notes (Signed)
Orthopedic Tech Progress Note Patient Details:  Kara [name] 04-22-1955 [id number] Brace completed by bio-tech. Patient ID: Kara [name], female   DOB: April 13, 1955, 61 y.o.   MRN: [id number]   Kara [name] 02/23/2017, 3:04 PM

## 2017-02-23 NOTE — Op Note (Signed)
Preop diagnosis: L3-4 spinal stenosis with neurogenic claudication above previous L4-S1 instrumented fusion,with previous L3-4 microdiscectomy for  HNP  Postop diagnosis: Same  Procedure: L3-4 right transforaminal interbody fusion with cage using local bone pedicle instrumentation.  Removal of previous L4-S1 rods and caps bilaterally with rotation 80 mm rods right and left.  Bilateral intertransverse process fusion at L3-4.  8 mm T-PAL banana shaped cage with 6 mm x 40 mm pedicle screws. ( removal of previous segmental posterior instrumentation and one level fusion above old fusion, re-exploration and decompression at L3-4 site of previous surgery, operative microscope used , instrumentation from L3-S1 )  Surgeon: Annell Greening  Assistant: Zonia Kief PA-C medically necessary and present for the entire procedure  Anesthesia: General plus Marcaine local  EBL: 200 cc with Cell Saver re-transfusion less than 100 cc  Brief history: 61-year-old female with previous surgery done elsewhere at L4-5 with laterally placed interbody cage spinous process plate with pseudoarthrosis treated by me April 2016 with removal of loose posterior hardware fusion L4-S1 for stenosis and spondylolisthesis at L5-S1.  Fused solidly then ruptured 2017 at the L3-4 level on the right.  Microdiscectomy was performed.  You did well for a year and then developed progressive spinal stenosis L3-4, severe recurrent and multifactorial spinal stenosis.  She is brought back to surgery at this time to fuse the adjacent level.  She had had previous end plate collapse from her lateral cage with settling of the cage into the endplate originally done elsewhere with her first surgery at L4-5.  Option of lateral instrumentation at L3-4 was discussed with the patient the decision was made to re-instrument posteriorly. Had been successful with her 2006 surgery.  Operative procedure after induction of general anesthesia patient was placed on the spine  frame careful padding and positioning.  Drapes were placed DuraPrep sterile skin marker Betadine Steri-Drape timeout procedure and laminectomy drapes.  The arm was sterilely draped and available as well as the operative microscope.  Old incision was opened and extended proximally 1 level subperiosteal dissection on the spinous process at L3 out over the lamina of L3 was performed C arm images was used for confirmation of the appropriate level.  Decompression was performed using the operative microscope since there was adherent scar tissue present in the right lateral gutter thick chunks of ligament and overhanging spurs were removed.  The dura was decompressed protected with patties and some Surgi-Flo was used for control of epidural bleeding.  The disc was identified with a [place] 4 directly on the disc confirmed with C arm.  Right and left old rods were identified and there was bone that had grown up over the edges of the rod and had to be resected with an osteotome mallet removing small pieces of bone that was meticulously cleaned and used for the interbody fusion at L3 later.  Lamina and spinous process as well as facet joint on the right were resected and also cleaned and using a rondure morselized into  tiny pieces of bone.  Once all bone was removed from the caps were removed and rods were removed both right and left hour of bone resection using the osteotome..  Discectomy was performed on the right side at L3-4 removing the protruding disc and curetting the endplates with angled curettes, straight curettes, ring curettes and rasps of the endplate care taken excessively past history of cage subsidence or surgery that was done elsewhere once there was good bleeding endplates trial sizers with 7 and 8  showed an 8 gave an excellent fit of the arm.  Banana-shaped T-PAL 8 mm cage was packed with bone and then carefully inserted with the D'Errico under the operative microscope used to protect the dura.  Cage  was inserted until the tip was even with the posterior vertebral body above and below at L3 and L4 the checked under C arm and then carefully kicked over checking it under C arm was kicked over countersunk placed into the midportion of the vertebral body.  Large amount of bone had been placed in front of the cage before the cage was inserted using the patient's own decorticated small pieces of bone.  Surgi-Flo and small patty was placed on top of the cage once it was in good position and then pedicle screws were inserted into L3 right and the left C arm intermittently starting with the all checking the C arm, using the use of the joystick, checking under C arm to ensure that there was bone anteriorly and feeling inside the canal along the inferior medial edge of the pedicle to make sure there was no violation of the pedicles.  Right and left screws were placed by 40 mm screws.  C-arm was used to look down the pedicle as well as AP and lateral spot fluoroscopic pictures to confirm good position.  80 mm rods were selected of bone had to be removed aspect of the old pedicle screws so that all 4 lined up appropriately and the millimeter rod could be inserted with 1-2 mm sticking out on each hand.  Were applied tightened down securely and locked attention screwdriver 2.  Right side with a cage was inserted was compressed first at the L3-4 level for final screw down regularly.  Rod was present on each side and then the left side was tightened down placing the last right The left side at L3 tightening it down with the rod present above and below.  Prior to placing the rod screws were removed Coker was used and there was no motion at the previous L4-S1 fusion.  There was inspected lateral gutters were run to make sure there was no residual compression.  Transverse processes prior to placement of the L3 pedicle screws were decorticated and remaining portion of bone was placed laterally between the transverse processes and  the old fusion mass laterally in the gutter from L3-L4 and L5.  Piper retractor was then carefully removed and now checked again to make sure there was no bone repeat irrigation, re-dose of Ancef appropriately timed under layer closure with #1 Vicryl in the deep fascia to on the subcutaneous tissue skin closure postop and transferred to the recovery room patient was neurologically intact and had good relief of her preop right leg pain and foot numbness.  Motor strength was tested and was good patient tolerated the procedure well.

## 2017-02-23 NOTE — Telephone Encounter (Signed)
patient denied by recent pain management they cancelled out referral when declining to accept patient. Re entered referral and forwarded to Diane

## 2017-02-23 NOTE — Transfer of Care (Signed)
Immediate Anesthesia Transfer of Care Note  Patient: Kara [name]  Procedure(s) Performed: L3-4 RIGHT TRANSFORAMINAL LUMBAR INTERBODY FUSION, REMOVAL RODS L4-S1, PEDICLE INSTRUMENTATION (N/A )  Patient Location: PACU  Anesthesia Type:General  Level of Consciousness: awake, alert , oriented and sedated  Airway & Oxygen Therapy: Patient Spontanous Breathing and Patient connected to nasal cannula oxygen  Post-op Assessment: Report given to RN, Post -op Vital signs reviewed and stable and Patient moving all extremities  Post vital signs: Reviewed and stable  Last Vitals:  Vitals:   02/23/17 0551  BP: (!) 146/123  Pulse: 71  Resp: 20  Temp: 37.1 C  SpO2: 99%    Last Pain:  Vitals:   02/23/17 0600  PainSc: 9       Patients Stated Pain Goal: 3 (02/23/17 0550)  Complications: No apparent anesthesia complications

## 2017-02-23 NOTE — Interval H&P Note (Signed)
History and Physical Interval Note:  02/23/2017 7:22 AM  Kara [name]  has presented today for surgery, with the diagnosis of L3-4 Stenosis, Biforaminal Stenosis  The various methods of treatment have been discussed with the patient and family. After consideration of risks, benefits and other options for treatment, the patient has consented to  Procedure(s): L3-4 RIGHT TRANSFORAMINAL LUMBAR INTERBODY FUSION, REMOVAL RODS L4-S1, PEDICLE INSTRUMENTATION (N/A) as a surgical intervention .  The patient's history has been reviewed, patient examined, no change in status, stable for surgery.  I have reviewed the patient's chart and labs.  Questions were answered to the patient's satisfaction.     Eldred Manges

## 2017-02-24 NOTE — Evaluation (Signed)
Occupational Therapy Evaluation Patient Details Name: Kara [name] MRN: [id number] DOB: October 24, 1955 Today's Date: 02/24/2017    History of Present Illness Pt is a 61 y/o female s/p L3-4 TLIF. PMH includes anxiety, fibromyalgia, HTN, pre-diabetes, depression, RA, ACDF, and lumbar fusion.    Clinical Impression   Pt with decline in function and safety with ADLs and ADL mobility with decreased balance and endurance. Pt requires cues to maintain back precautions and safety during ADLs and ADL mobility. Pt with hx of back surgeries. Pt declined education and demo of A/E, reporting that she was aware of the A/E and what it does. Pt would benefit from acute OT services to address impairments to maximize level of function to return home safely    Follow Up Recommendations  No OT follow up;Supervision - Intermittent    Equipment Recommendations  Other (comment)(ADL A/E kit)    Recommendations for Other Services       Precautions / Restrictions Precautions Precautions: Back Precaution Booklet Issued: No Precaution Comments: Verbally reviewed back precautions with pt.  Required Braces or Orthoses: Spinal Brace Spinal Brace: Lumbar corset;Applied in standing position Restrictions Weight Bearing Restrictions: No Other Position/Activity Restrictions: Pt able to apply lumbar corset without assistance.        Mobility Bed Mobility Overal bed mobility: Needs Assistance Bed Mobility: Supine to Sit;Sit to Supine Rolling: Supervision Sidelying to sit: Supervision       General bed mobility comments: verbal cues for back precautions during bed mobility  Transfers Overall transfer level: Needs assistance Equipment used: None Transfers: Sit to/from Stand Sit to Stand: Supervision         General transfer comment: Supervision for safety.      Balance Overall balance assessment: Needs assistance Sitting-balance support: No upper extremity supported;Feet supported Sitting balance-Leahy  Scale: Good     Standing balance support: Single extremity supported;No upper extremity supported;During functional activity Standing balance-Leahy Scale: Fair                             ADL either performed or assessed with clinical judgement   ADL Overall ADL's : Needs assistance/impaired     Grooming: Wash/dry hands;Wash/dry face;Standing;Supervision/safety   Upper Body Bathing: Supervision/ safety;Set up;With caregiver independent assisting;Sitting   Lower Body Bathing: Minimal assistance;Cueing for back precautions;Cueing for safety;With caregiver independent assisting   Upper Body Dressing : Supervision/safety;Set up;With caregiver independent assisting   Lower Body Dressing: Minimal assistance;With caregiver independent assisting;Cueing for back precautions;Cueing for safety   Toilet Transfer: Supervision/safety;Cueing for safety;Comfort height toilet;Ambulation;RW   Toileting- Water quality scientist and Hygiene: Min guard;Sit to/from stand;With caregiver independent assisting   Tub/ Shower Transfer: 3 in 1;Supervision/safety;With caregiver independent assisting;Ambulation;Rolling walker   Functional mobility during ADLs: Supervision/safety;Caregiver able to provide necessary level of assistance;Cueing for safety;Rolling walker General ADL Comments: pt verbailized knowledge of ADL A/E , declined education and demo     Vision Baseline Vision/History: Wears glasses Wears Glasses: Reading only Patient Visual Report: No change from baseline       Perception     Praxis      Pertinent Vitals/Pain Pain Assessment: 0-10 Pain Score: 4  Faces Pain Scale: Hurts little more Pain Location: back  Pain Descriptors / Indicators: Aching;Operative site guarding Pain Intervention(s): Monitored during session;Premedicated before session;Repositioned;Ice applied;Relaxation     Hand Dominance Right   Extremity/Trunk Assessment Upper Extremity Assessment Upper  Extremity Assessment: Overall WFL for tasks assessed   Lower Extremity Assessment Lower Extremity  Assessment: Defer to PT evaluation   Cervical / Trunk Assessment Cervical / Trunk Assessment: Other exceptions Cervical / Trunk Exceptions: s/p TLIF    Communication Communication Communication: No difficulties   Cognition Arousal/Alertness: Awake/alert Behavior During Therapy: WFL for tasks assessed/performed Overall Cognitive Status: Within Functional Limits for tasks assessed                                     General Comments   pt pleasant and cooperative    Exercises     Shoulder Instructions      Home Living Family/patient expects to be discharged to:: Private residence Living Arrangements: Spouse/significant other Available Help at Discharge: Family;Available 24 hours/day Type of Home: House Home Access: Stairs to enter [employer] of Steps: 2   Home Layout: One level     Bathroom Shower/Tub: Occupational psychologist: Standard     Home Equipment: None          Prior Functioning/Environment Level of Independence: Independent                 OT Problem List: Decreased activity tolerance;Pain;Impaired balance (sitting and/or standing);Decreased knowledge of precautions      OT Treatment/Interventions: Self-care/ADL training;DME and/or AE instruction;Therapeutic activities;Patient/family education    OT Goals(Current goals can be found in the care plan section) Acute Rehab OT Goals Patient Stated Goal: to go home  OT Goal Formulation: With patient/family Time For Goal Achievement: 03/03/17 Potential to Achieve Goals: Good ADL Goals Pt Will Perform Lower Body Bathing: with min guard assist;with supervision;with set-up;with adaptive equipment;with caregiver independent in assisting Pt Will Perform Lower Body Dressing: with min guard assist;with supervision;with set-up;with adaptive equipment;with caregiver independent  in assisting Pt Will Transfer to Toilet: with modified independence;ambulating Pt Will Perform Toileting - Clothing Manipulation and hygiene: with supervision;sit to/from stand;with caregiver independent in assisting Pt Will Perform Tub/Shower Transfer: with modified independence;ambulating;shower seat;3 in 1;with caregiver independent in assisting;rolling walker Additional ADL Goal #1: pt will complete bed mobility safely using proper techniques to sit EOB  OT Frequency: Min 2X/week   Barriers to D/C:    no barriers       Co-evaluation              AM-PAC PT "6 Clicks" Daily Activity     Outcome Measure Help from another person eating meals?: None Help from another person taking care of personal grooming?: A Little Help from another person toileting, which includes using toliet, bedpan, or urinal?: A Little Help from another person bathing (including washing, rinsing, drying)?: A Little Help from another person to put on and taking off regular upper body clothing?: A Little Help from another person to put on and taking off regular lower body clothing?: A Little 6 Click Score: 19   End of Session Equipment Utilized During Treatment: Rolling walker;Other (comment)(3 in 1)  Activity Tolerance: Patient tolerated treatment well Patient left: in bed;with call bell/phone within reach;with family/visitor present;with nursing/sitter in room  OT Visit Diagnosis: Unsteadiness on feet (R26.81);Pain Pain - Right/Left: (back) Pain - part of body: (back)                Time: 5997-7414 OT Time Calculation (min): 29 min Charges:  OT General Charges $OT Visit: 1 Visit OT Evaluation $OT Eval Low Complexity: 1 Low OT Treatments $Therapeutic Activity: 8-22 mins G-Codes: OT G-codes **NOT FOR INPATIENT CLASS** Functional Assessment Tool  Used: AM-PAC 6 Clicks Daily Activity     Britt Bottom 02/24/2017, 12:23 PM

## 2017-02-24 NOTE — Progress Notes (Signed)
Subjective: 1 Day Post-Op Procedure(s) (LRB): L3-4 RIGHT TRANSFORAMINAL LUMBAR INTERBODY FUSION, REMOVAL RODS L4-S1, PEDICLE INSTRUMENTATION (N/A) Patient reports pain as moderate.    Objective: Vital signs in last 24 hours: Temp:  [97.7 F (36.5 C)-98.5 F (36.9 C)] 98.5 F (36.9 C) (12/01 0634) Pulse Rate:  [74-91] 82 (12/01 0634) Resp:  [12-16] 16 (12/01 0634) BP: (94-149)/(54-84) 94/79 (12/01 0634) SpO2:  [95 %-100 %] 96 % (12/01 0634)  Intake/Output from previous day: 11/30 0701 - 12/01 0700 In: 4271.2 [P.O.:240; I.V.:3036.2; Blood:170; IV Piggyback:750] Out: 800 [Urine:300; Blood:500] Intake/Output this shift: No intake/output data recorded.  Recent Labs    02/21/17 1349  HGB 15.3*   Recent Labs    02/21/17 1349  WBC 10.3  RBC 5.42*  HCT 46.1*  PLT 357   Recent Labs    02/21/17 1349  NA 135  K 4.1  CL 100*  CO2 26  BUN 20  CREATININE 0.80  GLUCOSE 85  CALCIUM 10.0   Recent Labs    02/21/17 1349  INR 0.98    Neurologically intact ABD soft Awake, alert, pain under control Assessment/Plan: 1 Day Post-Op Procedure(s) (LRB): L3-4 RIGHT TRANSFORAMINAL LUMBAR INTERBODY FUSION, REMOVAL RODS L4-S1, PEDICLE INSTRUMENTATION (N/A) Discharge to home, dressing changed with aquacell-mild drainage  Valeria Batman 02/24/2017, 11:01 AM

## 2017-02-24 NOTE — Clinical Social Work Note (Signed)
CSW acknowledges SNF consult. PT recommended no follow up vs. Outpatient PT.  CSW signing off. Consult again if any other social work needs arise.  Charlynn Court, CSW [phone]

## 2017-02-24 NOTE — Progress Notes (Signed)
Physical Therapy Treatment Patient Details Name: Kara [name] MRN: [id number] DOB: [birth year] Today's Date: 02/24/2017    History of Present Illness Pt is a 61 y/o female s/p L3-4 TLIF. PMH includes anxiety, fibromyalgia, HTN, pre-diabetes, depression, RA, ACDF, and lumbar fusion.     PT Comments    Pt performed gait with c/o pain in RLE during gait training. Introduced RW to alleviate pain during gait.  Pt will require a RW at d/c for ambulation to reduce pain during recovery.  Plan next session for stair training.  Pt agitated during session wanting anti-inflammatory meds. Informed nurse of patient request.    Follow Up Recommendations  Supervision for mobility/OOB;Other (comment)(would benefit from outpatient PT once cleared by MD.  )     Equipment Recommendations  Rolling walker with 5" wheels    Recommendations for Other Services       Precautions / Restrictions Precautions Precautions: Back Precaution Booklet Issued: No Precaution Comments: Verbally reviewed back precautions with pt.  Required Braces or Orthoses: Spinal Brace Spinal Brace: Lumbar corset;Applied in standing position Restrictions Other Position/Activity Restrictions: Pt able to apply lumbar corset without assistance.      Mobility  Bed Mobility Overal bed mobility: Needs Assistance Bed Mobility: Supine to Sit           General bed mobility comments: Supervision as patient attempting to get out of the L side of the bed while twisting R.  Educated to avoid twisting motion.  Pt impulsive and appears set in her ways for mobility.  Education provided for safety.    Transfers Overall transfer level: Needs assistance Equipment used: None Transfers: Sit to/from Stand Sit to Stand: Supervision         General transfer comment: Supervision for safety.    Ambulation/Gait Ambulation/Gait assistance: Supervision Ambulation Distance (Feet): 200 Feet Assistive device: Rolling walker (2 wheeled) Gait  Pattern/deviations: Step-through pattern;Decreased stride length Gait velocity: Decreased Gait velocity interpretation: Below normal speed for age/gender General Gait Details: Pt remains guarded and perfomed 1st 10 feet without AD, presented with flexed posture and short stance time on R due to RLE pain.  Introduced RW to reduce pain and improve posture.  Pt will required RW at d/c.     Stairs Stairs: (Refused, perseverated on wanting morning meds.  )          Wheelchair Mobility    Modified Rankin (Stroke Patients Only)       Balance Overall balance assessment: Needs assistance   Sitting balance-Leahy Scale: Good       Standing balance-Leahy Scale: Fair                              Cognition Arousal/Alertness: Awake/alert Behavior During Therapy: WFL for tasks assessed/performed Overall Cognitive Status: Within Functional Limits for tasks assessed                                        Exercises      General Comments        Pertinent Vitals/Pain Pain Assessment: Faces Faces Pain Scale: Hurts little more Pain Location: back  Pain Descriptors / Indicators: Aching;Operative site guarding Pain Intervention(s): Monitored during session;Repositioned;Ice applied    Home Living                      Prior Function  PT Goals (current goals can now be found in the care plan section) Acute Rehab PT Goals Patient Stated Goal: to go home  Potential to Achieve Goals: Good Progress towards PT goals: Progressing toward goals    Frequency    Min 5X/week      PT Plan Discharge plan needs to be updated    Co-evaluation              AM-PAC PT "6 Clicks" Daily Activity  Outcome Measure  Difficulty turning over in bed (including adjusting bedclothes, sheets and blankets)?: A Little Difficulty moving from lying on back to sitting on the side of the bed? : A Little Difficulty sitting down on and standing up  from a chair with arms (e.g., wheelchair, bedside commode, etc,.)?: Unable Help needed moving to and from a bed to chair (including a wheelchair)?: A Little Help needed walking in hospital room?: A Little Help needed climbing 3-5 steps with a railing? : A Little 6 Click Score: 16    End of Session Equipment Utilized During Treatment: Back brace Activity Tolerance: Patient tolerated treatment well Patient left: in bed;with call bell/phone within reach Nurse Communication: Mobility status PT Visit Diagnosis: Unsteadiness on feet (R26.81);Other abnormalities of gait and mobility (R26.89);Pain Pain - part of body: (back)     Time: 6213-0865 PT Time Calculation (min) (ACUTE ONLY): 11 min  Charges:  $Gait Training: 8-22 mins                    G Codes:       Joycelyn Rua, PTA pager [phone]    Florestine Avers 02/24/2017, 11:17 AM

## 2017-02-26 NOTE — Anesthesia Postprocedure Evaluation (Signed)
Anesthesia Post Note  Patient: Kara [name]  Procedure(s) Performed: L3-4 RIGHT TRANSFORAMINAL LUMBAR INTERBODY FUSION, REMOVAL RODS L4-S1, PEDICLE INSTRUMENTATION (N/A )     Patient location during evaluation: PACU Anesthesia Type: General Level of consciousness: sedated and patient cooperative Pain management: pain level controlled Vital Signs Assessment: post-procedure vital signs reviewed and stable Respiratory status: spontaneous breathing Cardiovascular status: stable Anesthetic complications: no    Last Vitals:  Vitals:   02/23/17 2151 02/24/17 0634  BP: 131/70 94/79  Pulse: 84 82  Resp:  16  Temp: 36.5 C 36.9 C  SpO2: 96% 96%    Last Pain:  Vitals:   02/24/17 0900  TempSrc:   PainSc: 7                  Lewie Loron

## 2017-03-01 ENCOUNTER — Telehealth (INDEPENDENT_AMBULATORY_CARE_PROVIDER_SITE_OTHER): Payer: Self-pay | Admitting: Orthopaedic Surgery

## 2017-03-01 NOTE — Telephone Encounter (Signed)
Patient called asking for a refill on her oxycodone and also wanted to ask you if she could take a shower yet or not? CB # [phone]

## 2017-03-01 NOTE — Telephone Encounter (Signed)
Please advise 

## 2017-03-02 MED ORDER — HYDROCODONE-ACETAMINOPHEN 7.5-325 MG PO TABS: 1.0000 | ORAL_TABLET | Freq: Four times a day (QID) | ORAL | 0 refills | Status: DC | PRN

## 2017-03-02 NOTE — Telephone Encounter (Signed)
I called. OK for shower.   Fill out norco 7.5/325  # 40 1 po q 6 hrs  Prn pain. Thanks her husband will pick up.

## 2017-03-02 NOTE — Telephone Encounter (Signed)
Script printed.

## 2017-03-02 NOTE — Addendum Note (Signed)
Addended by: Rogers Seeds on: 03/02/2017 02:27 PM   Modules accepted: Orders

## 2017-03-08 ENCOUNTER — Ambulatory Visit (INDEPENDENT_AMBULATORY_CARE_PROVIDER_SITE_OTHER): Payer: Managed Care, Other (non HMO) | Admitting: Orthopaedic Surgery

## 2017-03-08 ENCOUNTER — Encounter (INDEPENDENT_AMBULATORY_CARE_PROVIDER_SITE_OTHER): Payer: Self-pay | Admitting: Orthopaedic Surgery

## 2017-03-08 ENCOUNTER — Ambulatory Visit (INDEPENDENT_AMBULATORY_CARE_PROVIDER_SITE_OTHER): Payer: Managed Care, Other (non HMO)

## 2017-03-08 VITALS — BP 131/88 | HR 94

## 2017-03-08 DIAGNOSIS — Z981 Arthrodesis status: Secondary | ICD-10-CM

## 2017-03-08 MED ORDER — HYDROCODONE-ACETAMINOPHEN 7.5-325 MG PO TABS: ORAL_TABLET | ORAL | 0 refills | Status: DC

## 2017-03-08 NOTE — Discharge Summary (Signed)
Patient ID: Kara [name] MRN: [id number] DOB/AGE: [birth year] 60 y.o.  Admit date: 02/23/2017 Discharge date: 03/08/2017  Admission Diagnoses:  Active Problems:   Lumbar stenosis   Discharge Diagnoses:  Active Problems:   Lumbar stenosis  status post Procedure(s): L3-4 RIGHT TRANSFORAMINAL LUMBAR INTERBODY FUSION, REMOVAL RODS L4-S1, PEDICLE INSTRUMENTATION  Past Medical History:  Diagnosis Date  . ADD (attention deficit disorder)    on Adderal  . Aggressive behavior of adult   . Anemia   . Anxiety   . Cellulitis of breast 11/2013   RIGHT BREAST  . Childhood asthma   . 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 Chronic lower back pain   . Chronic pain    went to [facility] for pain control; stopped in 2016 " (02/23/2017)  . Cold sore   . 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 Degenerative disc disease, lumbar   . Depression    takes meds daily  . Family history of malignant neoplasm of breast   . Fibromyalgia   . History of kidney stones   . Hypertension   . Osteoporosis   . Other specified rheumatoid arthritis, right shoulder (HCC) 08/01/2011  . Pneumonia 2010?  [name]. Post-nasal drip    hx of  . Pre-diabetes   . RA (rheumatoid arthritis) (HCC)    autoimmune arthritis    Surgeries: Procedure(s): L3-4 RIGHT TRANSFORAMINAL LUMBAR INTERBODY FUSION, REMOVAL RODS L4-S1, PEDICLE INSTRUMENTATION on 02/23/2017   Consultants:   Discharged Condition: Improved  Hospital Course: Kara DoloresBarbara Krolikowski is an 61 y.o. female who was admitted 02/23/2017 for operative treatment of lumbar stenosis. Patient failed conservative treatments (please see the history and physical for the specifics) and had severe unremitting pain that affects sleep, daily activities and work/hobbies. After pre-op clearance, the patient was taken to the operating room on 02/23/2017 and underwent  Procedure(s): L3-4 RIGHT TRANSFORAMINAL LUMBAR  INTERBODY FUSION, REMOVAL RODS L4-S1, PEDICLE INSTRUMENTATION.    Patient was given perioperative antibiotics:  Anti-infectives (From admission, onward)   Start     Dose/Rate Route Frequency Ordered Stop   02/23/17 1930  ceFAZolin (ANCEF) IVPB 1 g/50 mL premix     1 g 100 mL/hr over 30 Minutes Intravenous Every 8 hours 02/23/17 1418 02/24/17 0626   02/23/17 0547  ceFAZolin (ANCEF) IVPB 2g/100 mL premix     2 g 200 mL/hr over 30 Minutes Intravenous On call to O.R. 02/23/17 0547 02/23/17 1145   02/23/17 0546  ceFAZolin (ANCEF) 2-4 GM/100ML-% IVPB    Comments:  Dia CrawfordGallman, Kathie   : cabinet override      02/23/17 0546 02/23/17 1759       Patient was given sequential compression devices and early ambulation to prevent DVT.   Patient benefited maximally from hospital stay and there were no complications. At the time of discharge, the patient was urinating/moving their bowels without difficulty, tolerating a regular diet, pain is controlled with oral pain medications and they have been cleared by PT/OT.   Recent vital signs: No data found.   Recent laboratory studies: No results for input(s): WBC, HGB, HCT, PLT, NA, K, CL, CO2, BUN, CREATININE, GLUCOSE, INR, CALCIUM in the last 72 hours.  Invalid input(s): PT, 2   Discharge Medications:   Allergies as of 02/24/2017      Reactions   Aspirin Anaphylaxis   Ibuprofen Anaphylaxis   Ativan [lorazepam] Other (See Comments)   AGITATION CONFUSION  Ketamine Other (See Comments)   Hallucinations   Toradol [ketorolac Tromethamine] Itching, Swelling, Rash   UNSPECIFIED REACTION    Tramadol Hcl Itching, Swelling, Rash   SWELLING REACTION UNSPECIFIED  Tolerates Dilaudid 06/2016.  TDD.      Medication List    STOP taking these medications   cyclobenzaprine 10 MG tablet Commonly known as:  FLEXERIL   HYDROcodone-acetaminophen 5-325 MG tablet Commonly known as:  NORCO/VICODIN   meloxicam 15 MG tablet Commonly known as:  MOBIC     TAKE  these medications   acetaminophen 500 MG tablet Commonly known as:  TYLENOL Take 500 mg by mouth every 8 (eight) hours as needed for mild pain or moderate pain.   ADDERALL XR 30 MG 24 hr capsule Generic drug:  amphetamine-dextroamphetamine Take 60 mg by mouth daily.   amphetamine-dextroamphetamine 30 MG tablet Commonly known as:  ADDERALL Take 30 mg by mouth daily. At 1500   ALPRAZolam 1 MG tablet Commonly known as:  XANAX Take 1 mg by mouth 4 (four) times daily as needed for anxiety (takes for pain which causes anxiety).   cephALEXin 500 MG capsule Commonly known as:  KEFLEX Take 1 capsule (500 mg total) by mouth 3 (three) times daily. What changed:  additional instructions   cholecalciferol 1000 units tablet Commonly known as:  VITAMIN D Take 1,000 Units by mouth daily.   Diclofenac Sodium 2 % Soln Commonly known as:  PENNSAID Place 2 g onto the skin 4 (four) times daily as needed.   lisinopril 10 MG tablet Commonly known as:  PRINIVIL,ZESTRIL Take 1 tablet (10 mg total) by mouth daily. What changed:    when to take this  reasons to take this   methocarbamol 500 MG tablet Commonly known as:  ROBAXIN Take 1 tablet (500 mg total) by mouth every 6 (six) hours as needed for muscle spasms.   ORENCIA CLICKJECT 125 MG/ML Soaj Generic drug:  Abatacept   oxyCODONE-acetaminophen 10-325 MG tablet Commonly known as:  PERCOCET Take 1 tablet by mouth every 6 (six) hours as needed for pain.   predniSONE 5 MG tablet Commonly known as:  DELTASONE TAKE 1 TABLET (5 MG TOTAL) BY MOUTH DAILY WITH BREAKFAST.   pyridoxine 100 MG tablet Commonly known as:  B-6 Take 100 mg by mouth 2 (two) times daily.   REDNESS RELIEF MAX STRENGTH OP Apply 1 drop to eye as needed (irritaion / redness).   SAVELLA 25 MG Tabs Generic drug:  Milnacipran HCl Take 25 mg by mouth 3 (three) times a week.   tretinoin 0.1 % cream Commonly known as:  RETIN-A Apply topically at bedtime. What  changed:    how much to take  when to take this  reasons to take this   valACYclovir 1000 MG tablet Commonly known as:  VALTREX Take two tablets onset of coldsore and repeat 2 tablets in 12 hours   venlafaxine XR 75 MG 24 hr capsule Commonly known as:  EFFEXOR-XR Take 225 mg by mouth daily with breakfast. Takes 3 tablets   zolpidem 10 MG tablet Commonly known as:  AMBIEN TAKE 1 TABLET (10 mg) BY MOUTH AT BEDTIME       Diagnostic Studies: Dg Lumbar Spine Complete  Result Date: 02/23/2017 CLINICAL DATA:  L3-4 RIGHT TRANSFORAMINAL LUMBAR INTERBODY FUSION, REMOVAL RODS L4-S1, PEDICLE INSTRUMENTATION EXAM: DG C-ARM 61-120 MIN; LUMBAR SPINE - COMPLETE 4+ VIEW COMPARISON:  None FLUOROSCOPY TIME:  21 seconds FINDINGS: Prior posterior lumbar interbody fusion from L4 through S1. Extension  of posterior lumbar interbody fusion to the adjacent L3-4 level. IMPRESSION: Posterior lumbar interbody fusion from L3 through S1. Electronically Signed   By: Elige Ko   On: 02/23/2017 11:54   Mr Lumbar Spine W Wo Contrast  Result Date: 02/10/2017 CLINICAL DATA:  Back pain.  Prior lumbar spine surgery. EXAM: MRI LUMBAR SPINE WITHOUT AND WITH CONTRAST TECHNIQUE: Multiplanar and multiecho pulse sequences of the lumbar spine were obtained without and with intravenous contrast. Creatinine was obtained on site at [facility] at [address] Results: Creatinine 0.8 mg/dL. CONTRAST:  63mL MULTIHANCE GADOBENATE DIMEGLUMINE 529 MG/ML IV SOLN COMPARISON:  11/14/2015 FINDINGS: Segmentation:  Standard. Alignment:  Fused L5-S1 anterolisthesis. Vertebrae: Hemangioma in the L1 body. Mild degenerative edematous signal about the posterior L3-4 disc space. No acute fracture, discitis, or aggressive bone lesion. Conus medullaris: Extends to the L1 level and appears normal. Paraspinal and other soft tissues: Cholelithiasis. Disc levels: T11-12: Disc narrowing and bulging. Biforaminal protrusions with  noncompressive stenosis. T12- L1: Unremarkable. L1-L2: Mild spondylosis and annulus bulging.  No impingement L2-L3: Degenerative facet arthropathy with bony and ligamentous hypertrophy, progressed. Disc desiccation, narrowing, and mild bulging. No impingement L3-L4: Advanced facet arthropathy with ligamentum flavum thickening and interspinous cystic change, progressed. There is progressive disc desiccation and narrowing with biforaminal bulging and a right extraforaminal disc protrusion that is bulky and contacts the L3 nerve root. Spinal stenosis is advanced with no visible residual subarachnoid space. A right subarticular recess extrusion has been resected, with fibrosis in this area that is non masslike. L4-L5: Anterior and posterior fusion with posterior decompression. Patent canal and foramina. L5-S1:Anterior and posterior fusion with posterior decompression. Patent canal and foramina. IMPRESSION: 1. L3-4 progressive adjacent disc and facet degeneration with high-grade spinal stenosis. Progressed disc narrowing and bulging with right more than left foraminal stenosis. There is a right far-lateral disc protrusion contacting the right L3 nerve root. 2. Interval L3-4 right paracentral microdiscectomy. There is postoperative epidural fibrosis without recurrent herniation. 3. L2-3 degenerative facet arthropathy with progression from 2017. No compressive stenosis. 4. L4-5 and L5-S1 anterior and posterior arthrodesis without recurrent impingement. 5. Cholelithiasis. Electronically Signed   By: Marnee Spring M.D.   On: 02/10/2017 20:27   Dg C-arm 61-120 Min  Result Date: 02/23/2017 CLINICAL DATA:  L3-4 RIGHT TRANSFORAMINAL LUMBAR INTERBODY FUSION, REMOVAL RODS L4-S1, PEDICLE INSTRUMENTATION EXAM: DG C-ARM 61-120 MIN; LUMBAR SPINE - COMPLETE 4+ VIEW COMPARISON:  None FLUOROSCOPY TIME:  21 seconds FINDINGS: Prior posterior lumbar interbody fusion from L4 through S1. Extension of posterior lumbar interbody fusion  to the adjacent L3-4 level. IMPRESSION: Posterior lumbar interbody fusion from L3 through S1. Electronically Signed   By: Elige Ko   On: 02/23/2017 11:54   Xr Cervical Spine 2 Or 3 Views  Result Date: 02/19/2017 AP and lateral cervical spine x-rays obtained cervical fusion C5-6 C6-7 screws and posterior interspinous process wire there is narrowing and spondylosis at C4-5 above the fusion and posterior spurring. Impression: Postop C5-C7 anterior fusion with posterior wiring.  No evidence of screw loosening graft resorption.  Spondylosis above the fusion at C4-5 with disc space narrowing and spurring.   Discharge Instructions    Call MD / Call 911   Complete by:  As directed    If you experience chest pain or shortness of breath, CALL 911 and be transported to the hospital emergency room.  If you develope a fever above 101 F, pus (white drainage) or increased drainage or redness at the wound,  or calf pain, call your surgeon's office.   Constipation Prevention   Complete by:  As directed    Drink plenty of fluids.  Prune juice may be helpful.  You may use a stool softener, such as Colace (over the counter) 100 mg twice a day.  Use MiraLax (over the counter) for constipation as needed.   Diet - low sodium heart healthy   Complete by:  As directed    Increase activity slowly as tolerated   Complete by:  As directed         Discharge Plan:  discharge to home  Disposition:     Signed: Zonia Kief for Annell Greening M.D. 03/08/2017, 2:28 PM

## 2017-03-08 NOTE — Progress Notes (Addendum)
Post-Op Visit Note   Patient: Kara [name]           Date of Birth: 04/25/1955           MRN: [id number] Visit Date: 03/08/2017 PCP: Natalia Leatherwood, DO   Assessment & Plan: Follow-up L3-4 fusion above previous 2 level fusion.  X-rays look good cages in good position good relief of preop leg pain she was having.  She has been taking hydrocodone 7.5/325 she was taking 4-6 tablets a day.  Chief Complaint:  Chief Complaint  Patient presents with  . Lower Back - Routine Post Op   Visit Diagnoses:  1. Status post lumbar spinal fusion     Plan: Return in 1 month.  X-rays today show good position and alignment.  Follow-Up Instructions: Return in about 1 month (around 04/08/2017).   Orders:  Orders Placed This Encounter  Procedures  . XR Lumbar Spine 2-3 Views   Meds ordered this encounter  Medications  . DISCONTD: HYDROcodone-acetaminophen (NORCO) 7.5-325 MG tablet    Sig: Take 1 tablet every 4-6 hours as needed for pain    Dispense:  40 tablet    Refill:  0    Imaging: AP and lateral lumbar x-rays show good position of L3-4 cage and pedicle screws.  Impression: Satisfactory postop L3-4 fusion above solid L4-S1 fusion.  PMFS History: Patient Active Problem List   Diagnosis Date Noted  . Decreased libido 03/29/2017  . Lumbar stenosis 02/23/2017  . Spinal stenosis of lumbar region with neurogenic claudication 02/19/2017  . S/P lumbar spinal fusion 01/01/2017  . Essential hypertension 07/26/2016  . DNR (do not resuscitate) discussion   . Palliative care by specialist   . Cervical pseudoarthrosis (HCC) 06/21/2016  . Opioid dependence in remission (HCC) 06/14/2016  . Mitral valve prolapse 01/04/2016  . HNP (herniated nucleus pulposus), lumbar 12/10/2015  . Prediabetes 08/19/2015  . Overweight (BMI 25.0-29.9) 08/18/2015  . Chronic pain 08/18/2015  . Vitamin D deficiency 08/18/2015  . Chronic fatigue disorder 06/14/2015  . S/P cervical spinal fusion 03/15/2015  . PTSD  (post-traumatic stress disorder) 03/07/2014  . Severe recurrent major depression without psychotic features (HCC) 03/06/2014  . Suicide threat or attempt 03/06/2014  . Aggressive behavior   . Family history of malignant neoplasm of breast   . Rheumatoid arthritis (HCC) 12/13/2011  . Fibromyalgia 12/13/2011  . H/O cold sores 12/13/2011   Past Medical History:  Diagnosis Date  . ADD (attention deficit disorder)    on Adderal  . Aggressive behavior of adult   . Anemia   . Anxiety   . Cellulitis of breast 11/2013   RIGHT BREAST  . Childhood asthma   . 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 Chronic lower back pain   . Chronic pain    went to [facility] for pain control; stopped in 2016 " (02/23/2017)  . Cold sore   . 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 Degenerative disc disease, lumbar   . Depression    takes meds daily  . Family history of malignant neoplasm of breast   . Fibromyalgia   . History of kidney stones   . Hypertension   . Osteoporosis   . Other specified rheumatoid arthritis, right shoulder (HCC) 08/01/2011  . Pneumonia 2010?  [name] Post-nasal drip    hx of  . Pre-diabetes   . RA (rheumatoid arthritis) (HCC)    autoimmune arthritis  Family History  Problem Relation Age of Onset  . Arthritis Mother   . Heart disease Mother        ?psvt  . Breast cancer Mother 17       TAH/BSO  . Cancer Mother   . Mental illness Mother   . COPD Father   . Hypertension Father   . Alcohol abuse Father   . Mental illness Father   . Heart disease Father   . Healthy Daughter   . Breast cancer Maternal Aunt 27       deceased  . Cancer Cousin 86       female; unknown primary  . Colon cancer Paternal Aunt 51       deceased at 70  . Stomach cancer Paternal Uncle 31       deceased at 61  . Alcohol abuse Brother   . Cancer Brother   . Hodgkin's lymphoma Brother   . Mental illness Brother   . HIV  Brother   . Healthy Brother   . Healthy Son     Past Surgical History:  Procedure Laterality Date  . ANTERIOR CERVICAL DECOMP/DISCECTOMY FUSION N/A 03/15/2015   Procedure: Cervical five-six, Cerival six-seven, Anterior Cervical Discectomy and Fusion, Allograft and Plate;  Surgeon: Eldred Manges, MD;  Location: [facility] OR;  Service: Orthopedics;  Laterality: N/A;  . AUGMENTATION MAMMAPLASTY  2003  . BACK SURGERY    . BLADDER SUSPENSION  2009  . BREAST IMPLANT REMOVAL Bilateral 10/2013  . CERVICAL WOUND DEBRIDEMENT N/A 07/07/2016   Procedure: IRRIGATION AND DEBRIDEMENT POSTERIOR NECK;  Surgeon: Eldred Manges, MD;  Location: [facility] OR;  Service: Orthopedics;  Laterality: N/A;  . COLONOSCOPY    . COMBINED ABDOMINOPLASTY AND LIPOSUCTION  2003  . INCISION AND DRAINAGE ABSCESS Right 01/16/2014   Procedure: INCISION AND DRAINAGE AND OF RIGHT BREAST ABCESS;  Surgeon: Glenna Fellows, MD;  Location: [facility];  Service: General;  Laterality: Right;  . JOINT REPLACEMENT    . LUMBAR LAMINECTOMY/DECOMPRESSION MICRODISCECTOMY N/A 12/10/2015   Procedure: Right L3-4 Hemilaminectomy, Excision of herniated nucleus pulposus;  Surgeon: Eldred Manges, MD;  Location[facility];  Service: Orthopedics;  Laterality: N/A;  . MASS EXCISION  11/03/2011   Procedure: MINOR EXCISION OF MASS;  Surgeon: Wyn Forster., MD;  Location: [facility];  Service: Orthopedics;  Laterality: Left;  debride IP joint, cyst excision left index  . MAXIMUM ACCESS (MAS) TRANSFORAMINAL LUMBAR INTERBODY FUSION (TLIF) 2 LEVEL Right 02/23/2017  . POSTERIOR CERVICAL FUSION/FORAMINOTOMY N/A 06/21/2016   Procedure: POSTERIOR CERVICAL FUSION C5-C7 SPINOUS PROCESS WIRING;  Surgeon: Eldred Manges, MD;  Location: [facility] OR;  Service: Orthopedics;  Laterality: N/A;  . POSTERIOR LUMBAR FUSION  07/2009; 07/03/2014   "L4-5; L5-S1"  . SHOULDER ARTHROSCOPY Right 2012  . TONSILLECTOMY  1987  . TOTAL SHOULDER ARTHROPLASTY  08/01/2011   Procedure: TOTAL SHOULDER  ARTHROPLASTY;  Surgeon: Eulas Post, MD;  Location: [facility] OR;  Service: Orthopedics;  Laterality: Right;  Right total shoulder arthroplasty  . TUBAL LIGATION  1988   Social History   Occupational History  . Not on file  Tobacco Use  . Smoking status: Former Smoker    Packs/day: 1.00    Years: 10.00    Pack years: 10.00    Types: Cigarettes    Last attempt to quit: 12/06/2005    Years since quitting: 11.3  . Smokeless tobacco: Never Used  Substance and Sexual Activity  . Alcohol use: Yes  Comment: 02/23/2017 "might have 1-2 drinks/month"  . Drug use: No  . Sexual activity: Yes    Birth control/protection: Post-menopausal

## 2017-03-09 ENCOUNTER — Other Ambulatory Visit: Payer: Self-pay | Admitting: *Deleted

## 2017-03-09 MED ORDER — LISINOPRIL 10 MG PO TABS: 10.0000 mg | ORAL_TABLET | Freq: Every day | ORAL | 0 refills | Status: DC

## 2017-03-10 ENCOUNTER — Other Ambulatory Visit (INDEPENDENT_AMBULATORY_CARE_PROVIDER_SITE_OTHER): Payer: Self-pay | Admitting: Specialist

## 2017-03-12 NOTE — Telephone Encounter (Signed)
Meloxicam refill request

## 2017-03-13 ENCOUNTER — Encounter (INDEPENDENT_AMBULATORY_CARE_PROVIDER_SITE_OTHER): Payer: Self-pay | Admitting: Orthopaedic Surgery

## 2017-03-21 ENCOUNTER — Telehealth (INDEPENDENT_AMBULATORY_CARE_PROVIDER_SITE_OTHER): Payer: Self-pay | Admitting: Radiology

## 2017-03-21 ENCOUNTER — Encounter (INDEPENDENT_AMBULATORY_CARE_PROVIDER_SITE_OTHER): Payer: Self-pay | Admitting: Orthopaedic Surgery

## 2017-03-21 NOTE — Telephone Encounter (Signed)
Patient sent following My Chart message. I sent message back to her advising that you were out of the office until next week and I would have you answer then.  Please advise.  Dear Dr. Ophelia Charter & Tamela Oddi:  My left hand is getting worse. I  have to have the hand up most of the time. There is no pain just numb and tingling. I am not sure when the last MRI was done on my neck.   I am doing great otherwise. The wound has healed and I am not taking any pain meds. What should I do? Have a Happy New Year! Thank you,  Kara [name]

## 2017-03-23 ENCOUNTER — Other Ambulatory Visit (INDEPENDENT_AMBULATORY_CARE_PROVIDER_SITE_OTHER): Payer: Self-pay | Admitting: Orthopaedic Surgery

## 2017-03-23 NOTE — Telephone Encounter (Signed)
Please advise 

## 2017-03-25 NOTE — Telephone Encounter (Signed)
Will need ROV not sure if CTS vs spondylosis at C4-5.   ucall thanks

## 2017-03-26 NOTE — Telephone Encounter (Signed)
IC patient and made appt.  

## 2017-03-28 ENCOUNTER — Ambulatory Visit (INDEPENDENT_AMBULATORY_CARE_PROVIDER_SITE_OTHER): Payer: Managed Care, Other (non HMO) | Admitting: Family Medicine

## 2017-03-28 ENCOUNTER — Encounter: Payer: Self-pay | Admitting: Family Medicine

## 2017-03-28 ENCOUNTER — Ambulatory Visit (INDEPENDENT_AMBULATORY_CARE_PROVIDER_SITE_OTHER): Payer: Managed Care, Other (non HMO) | Admitting: Orthopaedic Surgery

## 2017-03-28 ENCOUNTER — Other Ambulatory Visit (HOSPITAL_COMMUNITY)
Admission: RE | Admit: 2017-03-28 | Discharge: 2017-03-28 | Disposition: A | Discharge status: Home / Self Care | Payer: Managed Care, Other (non HMO) | Source: Ambulatory Visit | Attending: Family Medicine | Admitting: Family Medicine

## 2017-03-28 ENCOUNTER — Encounter (INDEPENDENT_AMBULATORY_CARE_PROVIDER_SITE_OTHER): Payer: Self-pay | Admitting: Orthopaedic Surgery

## 2017-03-28 VITALS — BP 145/95 | HR 77 | Ht 64.0 in | Wt 164.5 lb

## 2017-03-28 VITALS — BP 145/95 | HR 73 | Temp 98.2°F | Resp 20 | Ht 64.0 in | Wt 164.5 lb

## 2017-03-28 DIAGNOSIS — R6882 Decreased libido: Secondary | ICD-10-CM | POA: Insufficient documentation

## 2017-03-28 DIAGNOSIS — I10 Essential (primary) hypertension: Secondary | ICD-10-CM | POA: Insufficient documentation

## 2017-03-28 DIAGNOSIS — M06811 Other specified rheumatoid arthritis, right shoulder: Secondary | ICD-10-CM | POA: Diagnosis not present

## 2017-03-28 DIAGNOSIS — Z79899 Other long term (current) drug therapy: Secondary | ICD-10-CM | POA: Insufficient documentation

## 2017-03-28 DIAGNOSIS — Z886 Allergy status to analgesic agent status: Secondary | ICD-10-CM | POA: Insufficient documentation

## 2017-03-28 DIAGNOSIS — Z87891 Personal history of nicotine dependence: Secondary | ICD-10-CM | POA: Diagnosis not present

## 2017-03-28 DIAGNOSIS — Z0000 Encounter for general adult medical examination without abnormal findings: Secondary | ICD-10-CM

## 2017-03-28 DIAGNOSIS — Z888 Allergy status to other drugs, medicaments and biological substances status: Secondary | ICD-10-CM | POA: Diagnosis not present

## 2017-03-28 DIAGNOSIS — Z8261 Family history of arthritis: Secondary | ICD-10-CM | POA: Diagnosis not present

## 2017-03-28 DIAGNOSIS — Z87442 Personal history of urinary calculi: Secondary | ICD-10-CM | POA: Insufficient documentation

## 2017-03-28 DIAGNOSIS — R7303 Prediabetes: Secondary | ICD-10-CM | POA: Insufficient documentation

## 2017-03-28 DIAGNOSIS — E2839 Other primary ovarian failure: Secondary | ICD-10-CM

## 2017-03-28 DIAGNOSIS — M797 Fibromyalgia: Secondary | ICD-10-CM | POA: Diagnosis not present

## 2017-03-28 DIAGNOSIS — Z8249 Family history of ischemic heart disease and other diseases of the circulatory system: Secondary | ICD-10-CM | POA: Diagnosis not present

## 2017-03-28 DIAGNOSIS — E663 Overweight: Secondary | ICD-10-CM | POA: Diagnosis not present

## 2017-03-28 DIAGNOSIS — Z01419 Encounter for gynecological examination (general) (routine) without abnormal findings: Secondary | ICD-10-CM | POA: Insufficient documentation

## 2017-03-28 DIAGNOSIS — F329 Major depressive disorder, single episode, unspecified: Secondary | ICD-10-CM | POA: Diagnosis not present

## 2017-03-28 DIAGNOSIS — F419 Anxiety disorder, unspecified: Secondary | ICD-10-CM | POA: Insufficient documentation

## 2017-03-28 DIAGNOSIS — Z6828 Body mass index (BMI) 28.0-28.9, adult: Secondary | ICD-10-CM | POA: Diagnosis not present

## 2017-03-28 DIAGNOSIS — Z825 Family history of asthma and other chronic lower respiratory diseases: Secondary | ICD-10-CM | POA: Insufficient documentation

## 2017-03-28 DIAGNOSIS — Z124 Encounter for screening for malignant neoplasm of cervix: Secondary | ICD-10-CM | POA: Diagnosis not present

## 2017-03-28 DIAGNOSIS — Z818 Family history of other mental and behavioral disorders: Secondary | ICD-10-CM | POA: Diagnosis not present

## 2017-03-28 DIAGNOSIS — Z803 Family history of malignant neoplasm of breast: Secondary | ICD-10-CM | POA: Diagnosis not present

## 2017-03-28 DIAGNOSIS — Z981 Arthrodesis status: Secondary | ICD-10-CM

## 2017-03-28 DIAGNOSIS — Z1151 Encounter for screening for human papillomavirus (HPV): Secondary | ICD-10-CM | POA: Insufficient documentation

## 2017-03-28 NOTE — Progress Notes (Signed)
   Patient ID: Kara [name], female  DOB: 01/31/1956, 61 y.o.   MRN: [id number] Patient Care Team    Relationship Specialty Notifications Start End  ,  A, DO PCP - General Family Medicine  06/14/15   Yates, Mark C, MD Consulting Physician Orthopedic Surgery  06/14/15   Kaur, Rupinder, MD Consulting Physician Psychiatry  06/14/15     Chief Complaint  Patient presents with  . Gynecologic Exam    Subjective:  Kara [name] is a 61 y.o.  Female  present for well women exam All past medical history, surgical history, allergies, family history, immunizations, medications and social history were updated in the electronic medical record today. All recent labs, ED visits and hospitalizations within the last year were reviewed.  Well woman exam: Patient reports she has been postmenopausal for greater than 20 years. She has a significant family history of breast cancer. She reports decrease libido complaints today. She is on multiple antidepressants. She endorses performing self breast exams monthly. She denies vaginal bleeding, vaginal discharge or dyspareunia.  Health maintenance:  Colonoscopy: completed 2010. follow up 10 years. Mammogram: completed:03/06/2016, birads 1. scheduled tomorrow. [facility]. Fhx of breast cancer.  Cervical cancer screening: last pap: 11/20/2012, results: "NL" Immunizations: tdap UTD 2011, Influenza UTD 2018 (encouraged yearly),declined Shingrix today.  Infectious disease screening: HIV  and Hep C completed DEXA: last completed 2012, result none, ordered today.  Assistive device: none Oxygen use:none Patient has a Dental home. Hospitalizations/ED visits: reviewed  Depression screen PHQ 2/9 08/22/2016 08/18/2015  Decreased Interest 2 0  Down, Depressed, Hopeless 2 0  PHQ - 2 Score 4 0  Altered sleeping 1 -  Tired, decreased energy 2 -  Change in appetite 1 -  Feeling bad or failure about yourself  2 -  Trouble concentrating 1 -  Moving slowly or  fidgety/restless 1 -  Suicidal thoughts 0 -  PHQ-9 Score 12 -  Difficult doing work/chores Somewhat difficult -  Some recent data might be hidden   No flowsheet data found.  Immunization History  Administered Date(s) Administered  . Td 03/27/2009    Past Medical History:  Diagnosis Date  . ADD (attention deficit disorder)    on Adderal  . Aggressive behavior of adult   . Anemia   . Anxiety   . Cellulitis of breast 11/2013   RIGHT BREAST  . Childhood asthma   . 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 Chronic lower back pain   . Chronic pain    went to [facility] for pain control; stopped in 2016 " (02/23/2017)  . Cold sore   . 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 Degenerative disc disease, lumbar   . Depression    takes meds daily  . Family history of malignant neoplasm of breast   . Fibromyalgia   . History of kidney stones   . Hypertension   . Osteoporosis   . Other specified rheumatoid arthritis, right shoulder (HCC) 08/01/2011  . Pneumonia 2010?  . Post-nasal drip    hx of  . Pre-diabetes   . RA (rheumatoid arthritis) (HCC)    autoimmune arthritis   Allergies  Allergen Reactions  . Aspirin Anaphylaxis  . Ibuprofen Anaphylaxis  . Ativan [Lorazepam] Other (See Comments)    AGITATION CONFUSION   . Ketamine Other (See Comments)    Hallucinations  . Toradol [Ketorolac Tromethamine] Itching, Swelling and Rash      UNSPECIFIED REACTION   . Tramadol Hcl Itching, Swelling and Rash    SWELLING REACTION UNSPECIFIED  Tolerates Dilaudid 06/2016.  TDD.   Past Surgical History:  Procedure Laterality Date  . ANTERIOR CERVICAL DECOMP/DISCECTOMY FUSION N/A 03/15/2015   Procedure: Cervical five-six, Cerival six-seven, Anterior Cervical Discectomy and Fusion, Allograft and Plate;  Surgeon: Mark C Yates, MD;  Location: [facility];  Service: Orthopedics;  Laterality: N/A;  . AUGMENTATION MAMMAPLASTY  2003  .  BACK SURGERY    . BLADDER SUSPENSION  2009  . BREAST IMPLANT REMOVAL Bilateral 10/2013  . CERVICAL WOUND DEBRIDEMENT N/A 07/07/2016   Procedure: IRRIGATION AND DEBRIDEMENT POSTERIOR NECK;  Surgeon: Mark C Yates, MD;  Location: [facility];  Service: Orthopedics;  Laterality: N/A;  . COLONOSCOPY    . COMBINED ABDOMINOPLASTY AND LIPOSUCTION  2003  . INCISION AND DRAINAGE ABSCESS Right 01/16/2014   Procedure: INCISION AND DRAINAGE AND OF RIGHT BREAST ABCESS;  Surgeon: Benjamin Hoxworth, MD;  Location: [facility];  Service: General;  Laterality: Right;  . JOINT REPLACEMENT    . LUMBAR LAMINECTOMY/DECOMPRESSION MICRODISCECTOMY N/A 12/10/2015   Procedure: Right L3-4 Hemilaminectomy, Excision of herniated nucleus pulposus;  Surgeon: Mark C Yates, MD;  Location: [facility];  Service: Orthopedics;  Laterality: N/A;  . MASS EXCISION  11/03/2011   Procedure: MINOR EXCISION OF MASS;  Surgeon: Robert V Sypher Jr., MD;  Location: [facility];  Service: Orthopedics;  Laterality: Left;  debride IP joint, cyst excision left index  . MAXIMUM ACCESS (MAS) TRANSFORAMINAL LUMBAR INTERBODY FUSION (TLIF) 2 LEVEL Right 02/23/2017  . POSTERIOR CERVICAL FUSION/FORAMINOTOMY N/A 06/21/2016   Procedure: POSTERIOR CERVICAL FUSION C5-C7 SPINOUS PROCESS WIRING;  Surgeon: Yates, Mark C, MD;  Location: [facility];  Service: Orthopedics;  Laterality: N/A;  . POSTERIOR LUMBAR FUSION  07/2009; 07/03/2014   "L4-5; L5-S1"  . SHOULDER ARTHROSCOPY Right 2012  . TONSILLECTOMY  1987  . TOTAL SHOULDER ARTHROPLASTY  08/01/2011   Procedure: TOTAL SHOULDER ARTHROPLASTY;  Surgeon: Joshua P Landau, MD;  Location: [facility];  Service: Orthopedics;  Laterality: Right;  Right total shoulder arthroplasty  . TUBAL LIGATION  1988   Family History  Problem Relation Age of Onset  . Arthritis Mother   . Heart disease Mother        ?psvt  . Breast cancer Mother 54       TAH/BSO  . Cancer Mother   . Mental illness Mother   . COPD Father   . Hypertension  Father   . Alcohol abuse Father   . Mental illness Father   . Heart disease Father   . Healthy Daughter   . Breast cancer Maternal Aunt 27       deceased  . Cancer Cousin 34       female; unknown primary  . Colon cancer Paternal Aunt 65       deceased at 67  . Stomach cancer Paternal Uncle 90       deceased at 92  . Alcohol abuse Brother   . Cancer Brother   . Hodgkin's lymphoma Brother   . Mental illness Brother   . HIV Brother   . Healthy Brother   . Healthy Son    Social History   Socioeconomic History  . Marital status: Married    Spouse name: Not on file  . Number of children: Not on file  . Years of education: Not on file  . Highest education level: Not on file  Social Needs  . Financial   resource strain: Not on file  . Food insecurity - worry: Not on file  . Food insecurity - inability: Not on file  . Transportation needs - medical: Not on file  . Transportation needs - non-medical: Not on file  Occupational History  . Not on file  Tobacco Use  . Smoking status: Former Smoker    Packs/day: 1.00    Years: 10.00    Pack years: 10.00    Types: Cigarettes    Last attempt to quit: 12/06/2005    Years since quitting: 11.3  . Smokeless tobacco: Never Used  Substance and Sexual Activity  . Alcohol use: Yes    Comment: 02/23/2017 "might have 1-2 drinks/month"  . Drug use: No  . Sexual activity: Yes    Birth control/protection: Post-menopausal  Other Topics Concern  . Not on file  Social History Narrative   Married, Kwong Bastedo.    6 children.    [employer], Retired social worker.    Denies alcohol, tobacco or drug use.   Drinks caffeinated beverages. Uses herbal remedies. Take a daily vitamin.   Wears her seatbelt. Exercises greater than 3 times a week.   Smoke detector in the home, firearms in the home in a locked cabinet, feels safe in her relationships.   Allergies as of 03/28/2017      Reactions   Aspirin Anaphylaxis   Ibuprofen Anaphylaxis   Ativan [lorazepam]  Other (See Comments)   AGITATION CONFUSION   Ketamine Other (See Comments)   Hallucinations   Toradol [ketorolac Tromethamine] Itching, Swelling, Rash   UNSPECIFIED REACTION    Tramadol Hcl Itching, Swelling, Rash   SWELLING REACTION UNSPECIFIED  Tolerates Dilaudid 06/2016.  TDD.      Medication List        Accurate as of 03/28/17 11:59 PM. Always use your most recent med list.          acetaminophen 500 MG tablet Commonly known as:  TYLENOL Take 500 mg by mouth every 8 (eight) hours as needed for mild pain or moderate pain.   ADDERALL XR 30 MG 24 hr capsule Generic drug:  amphetamine-dextroamphetamine Take 60 mg by mouth daily.   amphetamine-dextroamphetamine 30 MG tablet Commonly known as:  ADDERALL Take 30 mg by mouth daily. At 1500   ALPRAZolam 1 MG tablet Commonly known as:  XANAX Take 1 mg by mouth 4 (four) times daily as needed for anxiety (takes for pain which causes anxiety).   cholecalciferol 1000 units tablet Commonly known as:  VITAMIN D Take 1,000 Units by mouth daily.   cyclobenzaprine 10 MG tablet Commonly known as:  FLEXERIL Take 10 mg by mouth at bedtime.   Diclofenac Sodium 2 % Soln Commonly known as:  PENNSAID Place 2 g onto the skin 4 (four) times daily as needed.   hydroxychloroquine 200 MG tablet Commonly known as:  PLAQUENIL Take 1 tablet by mouth daily.   lisinopril 10 MG tablet Commonly known as:  PRINIVIL,ZESTRIL Take 1 tablet (10 mg total) by mouth daily.   meloxicam 15 MG tablet Commonly known as:  MOBIC TAKE 1 TABLET BY MOUTH EVERY DAY   methocarbamol 500 MG tablet Commonly known as:  ROBAXIN Take 1 tablet (500 mg total) by mouth every 6 (six) hours as needed for muscle spasms.   ORENCIA CLICKJECT 125 MG/ML Soaj Generic drug:  Abatacept   pyridoxine 100 MG tablet Commonly known as:  B-6 Take 100 mg by mouth 2 (two) times daily.   REDNESS RELIEF MAX STRENGTH   OP Apply 1 drop to eye as needed (irritaion / redness).     SAVELLA 25 MG Tabs Generic drug:  Milnacipran HCl Take 25 mg by mouth 3 (three) times a week.   tretinoin 0.1 % cream Commonly known as:  RETIN-A Apply topically at bedtime.   valACYclovir 1000 MG tablet Commonly known as:  VALTREX Take two tablets onset of coldsore and repeat 2 tablets in 12 hours   venlafaxine XR 75 MG 24 hr capsule Commonly known as:  EFFEXOR-XR Take 225 mg by mouth daily with breakfast. Takes 3 tablets   zolpidem 10 MG tablet Commonly known as:  AMBIEN TAKE 1 TABLET (10 mg) BY MOUTH AT BEDTIME       All past medical history, surgical history, allergies, family history, immunizations andmedications were updated in the EMR today and reviewed under the history and medication portions of their EMR.     Recent Results (from the past 2160 hour(s))  POCT Urinalysis Dipstick (Automated)     Status: Abnormal   Collection Time: 01/26/17  1:22 PM  Result Value Ref Range   Color, UA yellow    Clarity, UA clear    Glucose, UA negative    Bilirubin, UA negative    Ketones, UA negative    Spec Grav, UA >=1.030 (A) 1.010 - 1.025   Blood, UA trace-intact    pH, UA 5.5 5.0 - 8.0   Protein, UA negative    Urobilinogen, UA 0.2 0.2 or 1.0 E.U./dL   Nitrite, UA negative    Leukocytes, UA Negative Negative  Urine Culture     Status: None   Collection Time: 01/26/17  3:52 PM  Result Value Ref Range   MICRO NUMBER: [id number]    SPECIMEN QUALITY: ADEQUATE    Sample Source URINE, CLEAN CATCH    STATUS: FINAL    Result: No Growth   Hemoglobin A1c     Status: Abnormal   Collection Time: 02/21/17  1:48 PM  Result Value Ref Range   Hgb A1c MFr Bld 5.9 (H) 4.8 - 5.6 %    Comment: (NOTE) Pre diabetes:          5.7%-6.4% Diabetes:              >6.4% Glycemic control for   <7.0% adults with diabetes    Mean Plasma Glucose 122.63 mg/dL  Surgical pcr screen     Status: None   Collection Time: 02/21/17  1:48 PM  Result Value Ref Range   MRSA, PCR NEGATIVE NEGATIVE    Staphylococcus aureus NEGATIVE NEGATIVE    Comment: (NOTE) The Xpert SA Assay (FDA approved for NASAL specimens in patients 38 years of age and older), is one component of a comprehensive surveillance program. It is not intended to diagnose infection nor to guide or monitor treatment.   Urinalysis, Routine w reflex microscopic     Status: Abnormal   Collection Time: 02/21/17  1:48 PM  Result Value Ref Range   Color, Urine YELLOW YELLOW   APPearance HAZY (A) CLEAR   Specific Gravity, Urine 1.021 1.005 - 1.030   pH 6.0 5.0 - 8.0   Glucose, UA NEGATIVE NEGATIVE mg/dL   Hgb urine dipstick NEGATIVE NEGATIVE   Bilirubin Urine NEGATIVE NEGATIVE   Ketones, ur NEGATIVE NEGATIVE mg/dL   Protein, ur NEGATIVE NEGATIVE mg/dL   Nitrite NEGATIVE NEGATIVE   Leukocytes, UA NEGATIVE NEGATIVE  APTT     Status: None   Collection Time: 02/21/17  1:49 PM  Result  Value Ref Range   aPTT 28 24 - 36 seconds  CBC     Status: Abnormal   Collection Time: 02/21/17  1:49 PM  Result Value Ref Range   WBC 10.3 4.0 - 10.5 K/uL   RBC 5.42 (H) 3.87 - 5.11 MIL/uL   Hemoglobin 15.3 (H) 12.0 - 15.0 g/dL   HCT 46.1 (H) 36.0 - 46.0 %   MCV 85.1 78.0 - 100.0 fL   MCH 28.2 26.0 - 34.0 pg   MCHC 33.2 30.0 - 36.0 g/dL   RDW 14.7 11.5 - 15.5 %   Platelets 357 150 - 400 K/uL  Comprehensive metabolic panel     Status: Abnormal   Collection Time: 02/21/17  1:49 PM  Result Value Ref Range   Sodium 135 135 - 145 mmol/L   Potassium 4.1 3.5 - 5.1 mmol/L   Chloride 100 (L) 101 - 111 mmol/L   CO2 26 22 - 32 mmol/L   Glucose, Bld 85 65 - 99 mg/dL   BUN 20 6 - 20 mg/dL   Creatinine, Ser 0.80 0.44 - 1.00 mg/dL   Calcium 10.0 8.9 - 10.3 mg/dL   Total Protein 7.6 6.5 - 8.1 g/dL   Albumin 4.2 3.5 - 5.0 g/dL   AST 20 15 - 41 U/L   ALT 17 14 - 54 U/L   Alkaline Phosphatase 109 38 - 126 U/L   Total Bilirubin 0.4 0.3 - 1.2 mg/dL   GFR calc non Af Amer >60 >60 mL/min   GFR calc Af Amer >60 >60 mL/min    Comment: (NOTE) The  eGFR has been calculated using the CKD EPI equation. This calculation has not been validated in all clinical situations. eGFR's persistently <60 mL/min signify possible Chronic Kidney Disease.    Anion gap 9 5 - 15  Protime-INR     Status: None   Collection Time: 02/21/17  1:49 PM  Result Value Ref Range   Prothrombin Time 12.9 11.4 - 15.2 seconds   INR 0.98   Type and screen [facility]     Status: None   Collection Time: 02/21/17  2:06 PM  Result Value Ref Range   ABO/RH(D) O NEG    Antibody Screen NEG    Sample Expiration 03/07/2017    Extend sample reason NO TRANSFUSIONS OR PREGNANCY IN THE PAST 3 MONTHS     No results found.   ROS: 14 pt review of systems performed and negative (unless mentioned in an HPI)  Objective: Pulse 73   Temp 98.2 F (36.8 C)   Resp 20   Ht 5' 4" (1.626 m)   Wt 164 lb 8 oz (74.6 kg)   SpO2 100%   BMI 28.24 kg/m  Gen: Afebrile. No acute distress. Nontoxic in appearance, well-developed, well-nourished,  Caucasian female. HENT: AT. [place]. Bilateral TM visualized and normal in appearance, normal external auditory canal. MMM, no oral lesions, adequate dentition. Bilateral nares within normal limits. Throat without erythema, ulcerations or exudates. no Cough on exam, no hoarseness on exam. Eyes:Pupils Equal Round Reactive to light, Extraocular movements intact,  Conjunctiva without redness, discharge or icterus. Neck/lymp/endocrine: Supple,no lymphadenopathy, no thyromegaly CV: RRR, systolic click present, no edema, +2/4 P posterior tibialis pulses. No carotid bruits. No JVD. Chest: CTAB, no wheeze, rhonchi or crackles. Normal Respiratory effort. Good Air movement. Abd: Soft. Plan. NTND. BS present. No Masses palpated. No hepatosplenomegaly. No rebound tenderness or guarding. Skin: No rashes, purpura or petechiae. Warm and well-perfused. Skin intact. Neuro/Msk:    Normal gait. PERLA. EOMi. Alert. Oriented x3.   Psych: Normal affect,  dress and demeanor. Normal speech. Normal thought content and judgment. Breasts: Augmentation scars present. breasts appear normal, symmetrical, no tenderness on exam, no suspicious masses, no skin or nipple changes or axillary nodes. GYN:  External genitalia within normal limits, normal hair distribution, no lesions. Urethral meatus normal, no lesions. Vaginal mucosa pink, moist, normal rugae for age, no lesions. No cystocele or rectocele. cervix without lesions, no discharge. Bimanual exam revealed normal uterus.  No bladder/suprapubic fullness, masses or tenderness. No cervical motion tenderness. No adnexal fullness. Anus and perineum within normal limits, no lesions.  No exam data present  Assessment/plan: Dashawna Eyerman is a 61 y.o. female present for routine PAP.  Estrogen deficiency - DG Bone Density; Future Cervical cancer screening/Encounter for gynecological examination without abnormal finding - Cytology - PAP with co-testing.  Overweight (BMI 25.0-29.9) - pt requested referral to weight management - Amb Ref to Medical Weight Management  Essential hypertension - elevated today. Pt reports compliance with lisinopril 10 mg qd. She has been difficult to manage given she takes medications when she feels her BP is high and not routinely. Additionally she is on effexor and adderall high dose, both of which could increase her BP.    Encounter for preventive health examination Patient was encouraged to exercise greater than 150 minutes a week. Patient was encouraged to choose a diet filled with fresh fruits and vegetables, and lean meats. AVS provided to patient today for education/recommendation on gender specific health and safety maintenance. Colonoscopy: completed 2010. follow up 10 years. Mammogram: completed:03/06/2016, birads 1. scheduled tomorrow. [facility].  Cervical cancer screening: last pap: 11/20/2012, results: "NL" Immunizations: tdap UTD 2011, Influenza UTD 2018 (encouraged  yearly),declined Shingrix today.  Infectious disease screening: HIV  and Hep C completed DEXA: last completed 2012, result none, ordered today.  Decreased libido:  - She is higher risk for Breast Ca. Offered GYN referral for her to discuss other options besides hormone therapy and she adamantly stated "no, they will not do it.". Offered trial of Wellbutrin today to help with libido and she declined.    Return in about 1 year (around 03/28/2018) for CPE.  Electronically signed by:  , DO [facility]  

## 2017-03-28 NOTE — Patient Instructions (Signed)
I have placed a referral to weight management clinic and I placed an order to see if we can get your bone density done at same time as mammogram.   Health Maintenance, Female Adopting a healthy lifestyle and getting preventive care can go a long way to promote health and wellness. Talk with your health care provider about what schedule of regular examinations is right for you. This is a good chance for you to check in with your provider about disease prevention and staying healthy. In between checkups, there are plenty of things you can do on your own. Experts have done a lot of research about which lifestyle changes and preventive measures are most likely to keep you healthy. Ask your health care provider for more information. Weight and diet Eat a healthy diet  Be sure to include plenty of vegetables, fruits, low-fat dairy products, and lean protein.  Do not eat a lot of foods high in solid fats, added sugars, or salt.  Get regular exercise. This is one of the most important things you can do for your health. ? Most adults should exercise for at least 150 minutes each week. The exercise should increase your heart rate and make you sweat (moderate-intensity exercise). ? Most adults should also do strengthening exercises at least twice a week. This is in addition to the moderate-intensity exercise.  Maintain a healthy weight  Body mass index (BMI) is a measurement that can be used to identify possible weight problems. It estimates body fat based on height and weight. Your health care provider can help determine your BMI and help you achieve or maintain a healthy weight.  For females 53 years of age and older: ? A BMI below 18.5 is considered underweight. ? A BMI of 18.5 to 24.9 is normal. ? A BMI of 25 to 29.9 is considered overweight. ? A BMI of 30 and above is considered obese.  Watch levels of cholesterol and blood lipids  You should start having your blood tested for lipids and  cholesterol at 61 years of age, then have this test every 5 years.  You may need to have your cholesterol levels checked more often if: ? Your lipid or cholesterol levels are high. ? You are older than 61 years of age. ? You are at high risk for heart disease.  Cancer screening Lung Cancer  Lung cancer screening is recommended for adults 65-58 years old who are at high risk for lung cancer because of a history of smoking.  A yearly low-dose CT scan of the lungs is recommended for people who: ? Currently smoke. ? Have quit within the past 15 years. ? Have at least a 30-pack-year history of smoking. A pack year is smoking an average of one pack of cigarettes a day for 1 year.  Yearly screening should continue until it has been 15 years since you quit.  Yearly screening should stop if you develop a health problem that would prevent you from having lung cancer treatment.  Breast Cancer  Practice breast self-awareness. This means understanding how your breasts normally appear and feel.  It also means doing regular breast self-exams. Let your health care provider know about any changes, no matter how small.  If you are in your 20s or 30s, you should have a clinical breast exam (CBE) by a health care provider every 1-3 years as part of a regular health exam.  If you are 8 or older, have a CBE every year. Also consider having a  breast X-ray (mammogram) every year.  If you have a family history of breast cancer, talk to your health care provider about genetic screening.  If you are at high risk for breast cancer, talk to your health care provider about having an MRI and a mammogram every year.  Breast cancer gene (BRCA) assessment is recommended for women who have family members with BRCA-related cancers. BRCA-related cancers include: ? Breast. ? Ovarian. ? Tubal. ? Peritoneal cancers.  Results of the assessment will determine the need for genetic counseling and BRCA1 and BRCA2  testing.  Cervical Cancer Your health care provider may recommend that you be screened regularly for cancer of the pelvic organs (ovaries, uterus, and vagina). This screening involves a pelvic examination, including checking for microscopic changes to the surface of your cervix (Pap test). You may be encouraged to have this screening done every 3 years, beginning at age 56.  For women ages 52-65, health care providers may recommend pelvic exams and Pap testing every 3 years, or they may recommend the Pap and pelvic exam, combined with testing for human papilloma virus (HPV), every 5 years. Some types of HPV increase your risk of cervical cancer. Testing for HPV may also be done on women of any age with unclear Pap test results.  Other health care providers may not recommend any screening for nonpregnant women who are considered low risk for pelvic cancer and who do not have symptoms. Ask your health care provider if a screening pelvic exam is right for you.  If you have had past treatment for cervical cancer or a condition that could lead to cancer, you need Pap tests and screening for cancer for at least 20 years after your treatment. If Pap tests have been discontinued, your risk factors (such as having a new sexual partner) need to be reassessed to determine if screening should resume. Some women have medical problems that increase the chance of getting cervical cancer. In these cases, your health care provider may recommend more frequent screening and Pap tests.  Colorectal Cancer  This type of cancer can be detected and often prevented.  Routine colorectal cancer screening usually begins at 61 years of age and continues through 61 years of age.  Your health care provider may recommend screening at an earlier age if you have risk factors for colon cancer.  Your health care provider may also recommend using home test kits to check for hidden blood in the stool.  A small camera at the end of a  tube can be used to examine your colon directly (sigmoidoscopy or colonoscopy). This is done to check for the earliest forms of colorectal cancer.  Routine screening usually begins at age 82.  Direct examination of the colon should be repeated every 5-10 years through 61 years of age. However, you may need to be screened more often if early forms of precancerous polyps or small growths are found.  Skin Cancer  Check your skin from head to toe regularly.  Tell your health care provider about any new moles or changes in moles, especially if there is a change in a mole's shape or color.  Also tell your health care provider if you have a mole that is larger than the size of a pencil eraser.  Always use sunscreen. Apply sunscreen liberally and repeatedly throughout the day.  Protect yourself by wearing long sleeves, pants, a wide-brimmed hat, and sunglasses whenever you are outside.  Heart disease, diabetes, and high blood pressure  High  blood pressure causes heart disease and increases the risk of stroke. High blood pressure is more likely to develop in: ? People who have blood pressure in the high end of the normal range (130-139/85-89 mm Hg). ? People who are overweight or obese. ? People who are African American.  If you are 80-3 years of age, have your blood pressure checked every 3-5 years. If you are 28 years of age or older, have your blood pressure checked every year. You should have your blood pressure measured twice-once when you are at a hospital or clinic, and once when you are not at a hospital or clinic. Record the average of the two measurements. To check your blood pressure when you are not at a hospital or clinic, you can use: ? An automated blood pressure machine at a pharmacy. ? A home blood pressure monitor.  If you are between 14 years and 15 years old, ask your health care provider if you should take aspirin to prevent strokes.  Have regular diabetes screenings. This  involves taking a blood sample to check your fasting blood sugar level. ? If you are at a normal weight and have a low risk for diabetes, have this test once every three years after 61 years of age. ? If you are overweight and have a high risk for diabetes, consider being tested at a younger age or more often. Preventing infection Hepatitis B  If you have a higher risk for hepatitis B, you should be screened for this virus. You are considered at high risk for hepatitis B if: ? You were born in a country where hepatitis B is common. Ask your health care provider which countries are considered high risk. ? Your parents were born in a high-risk country, and you have not been immunized against hepatitis B (hepatitis B vaccine). ? You have HIV or AIDS. ? You use needles to inject street drugs. ? You live with someone who has hepatitis B. ? You have had sex with someone who has hepatitis B. ? You get hemodialysis treatment. ? You take certain medicines for conditions, including cancer, organ transplantation, and autoimmune conditions.  Hepatitis C  Blood testing is recommended for: ? Everyone born from [birth year] through 1964. ? Anyone with known risk factors for hepatitis C.  Sexually transmitted infections (STIs)  You should be screened for sexually transmitted infections (STIs) including gonorrhea and chlamydia if: ? You are sexually active and are younger than 61 years of age. ? You are older than 61 years of age and your health care provider tells you that you are at risk for this type of infection. ? Your sexual activity has changed since you were last screened and you are at an increased risk for chlamydia or gonorrhea. Ask your health care provider if you are at risk.  If you do not have HIV, but are at risk, it may be recommended that you take a prescription medicine daily to prevent HIV infection. This is called pre-exposure prophylaxis (PrEP). You are considered at risk if: ? You are  sexually active and do not regularly use condoms or know the HIV status of your partner(s). ? You take drugs by injection. ? You are sexually active with a partner who has HIV.  Talk with your health care provider about whether you are at high risk of being infected with HIV. If you choose to begin PrEP, you should first be tested for HIV. You should then be tested every 3 months for  as long as you are taking PrEP. Pregnancy  If you are premenopausal and you may become pregnant, ask your health care provider about preconception counseling.  If you may become pregnant, take 400 to 800 micrograms (mcg) of folic acid every day.  If you want to prevent pregnancy, talk to your health care provider about birth control (contraception). Osteoporosis and menopause  Osteoporosis is a disease in which the bones lose minerals and strength with aging. This can result in serious bone fractures. Your risk for osteoporosis can be identified using a bone density scan.  If you are 70 years of age or older, or if you are at risk for osteoporosis and fractures, ask your health care provider if you should be screened.  Ask your health care provider whether you should take a calcium or vitamin D supplement to lower your risk for osteoporosis.  Menopause may have certain physical symptoms and risks.  Hormone replacement therapy may reduce some of these symptoms and risks. Talk to your health care provider about whether hormone replacement therapy is right for you. Follow these instructions at home:  Schedule regular health, dental, and eye exams.  Stay current with your immunizations.  Do not use any tobacco products including cigarettes, chewing tobacco, or electronic cigarettes.  If you are pregnant, do not drink alcohol.  If you are breastfeeding, limit how much and how often you drink alcohol.  Limit alcohol intake to no more than 1 drink per day for nonpregnant women. One drink equals 12 ounces of  beer, 5 ounces of wine, or 1 ounces of hard liquor.  Do not use street drugs.  Do not share needles.  Ask your health care provider for help if you need support or information about quitting drugs.  Tell your health care provider if you often feel depressed.  Tell your health care provider if you have ever been abused or do not feel safe at home. This information is not intended to replace advice given to you by your health care provider. Make sure you discuss any questions you have with your health care provider. Document Released: 09/26/2010 Document Revised: 08/19/2015 Document Reviewed: 12/15/2014 Elsevier Interactive Patient Education  [employer].

## 2017-03-29 ENCOUNTER — Encounter: Payer: Self-pay | Admitting: Family Medicine

## 2017-03-29 DIAGNOSIS — R6882 Decreased libido: Secondary | ICD-10-CM | POA: Insufficient documentation

## 2017-03-30 LAB — CYTOLOGY - PAP
DIAGNOSIS: NEGATIVE
HPV (WINDOPATH): NOT DETECTED

## 2017-04-03 ENCOUNTER — Encounter (INDEPENDENT_AMBULATORY_CARE_PROVIDER_SITE_OTHER): Payer: Self-pay | Admitting: Orthopaedic Surgery

## 2017-04-03 NOTE — Progress Notes (Signed)
Office Visit Note   Patient: Kara [name]           Date of Birth: [birth year]           MRN: [id number] Visit Date: 03/28/2017              Requested by: Natalia Leatherwood, DO [address] [place], [place] [address] PCP: Natalia Leatherwood, DO   Assessment & Plan: Visit Diagnoses:  1. S/P cervical spinal fusion     Plan: Patient's had anterior and posterior fusion C5-C7.  Both incisions are well-healed.  He does have spondylosis with some spurring at C4-5.  Exam shows no radicular weakness.  We will have her continue some conservative modalities recheck her again in 2 months.  We will repeat lateral cervical flexion extension x-rays on return visit with single AP view.  Follow-Up Instructions: Return in about 2 months (around 05/26/2017).   Orders:  No orders of the defined types were placed in this encounter.  No orders of the defined types were placed in this encounter.     Procedures: No procedures performed   Clinical Data: No additional findings.   Subjective: Chief Complaint  Patient presents with  . Neck - Follow-up    HPI 62 year old female returns happy mood today.  States she is continuing to have neck pain left arm and has to put her left hand over her head to get relief.  She is noticed some weakness in the arm difficulty gripping objects.  No pain in the shoulder with range of motion overhead.  No fever or chills.  Review of Systems history of lumbar fusion anterior cervical fusion with pseudoarthrosis and posterior fusion with postoperative infection despite vancomycin powder requiring VAC assisted closure.  Chronic fatigue, severe recurrent depression with psychotic features.  Aggressive behavior.  Mitral valve prolapse.  Chronic fatigue.  History of chronic prescription heavy use intermittently.   Objective: Vital Signs: BP (!) 145/95   Pulse 77   Ht 5\' 4"  (1.626 m)   Wt 164 lb 8 oz (74.6 kg)   BMI 28.24 kg/m   Physical Exam  Constitutional: She is  oriented to person, place, and time. She appears well-developed.  HENT:  Head: Normocephalic.  Right Ear: External ear normal.  Left Ear: External ear normal.  Eyes: Pupils are equal, round, and reactive to light.  Neck: No tracheal deviation present. No thyromegaly present.  Cardiovascular: Normal rate.  Pulmonary/Chest: Effort normal.  Abdominal: Soft.  Neurological: She is alert and oriented to person, place, and time.  Skin: Skin is warm and dry.  Psychiatric: She has a normal mood and affect. Her behavior is normal.    Ortho Exam biceps triceps brachioradialis reflexes are 1+ and symmetrical.  Posterior cervical incision is healed although she does have subcutaneous fat atrophy and prominence of the spinous process where she had problems with postoperative infection requiring VAC and dressing changes for 2 months.  Posterior cervical incision is completely healed with wide scar impression process several millimeters under the skin.  No drainage and complete healing.  Patient has some brachial plexus tenderness on the left.  No shoulder instability.  Biceps triceps wrist flexion extension is strong no interosseous weakness carpal tunnel exam is normal.  Tinel's over the cubital tunnel.  Specialty Comments:  No specialty comments available.  Imaging: No results found.   PMFS History: Patient Active Problem List   Diagnosis Date Noted  . Decreased libido 03/29/2017  . Lumbar stenosis 02/23/2017  .  Spinal stenosis of lumbar region with neurogenic claudication 02/19/2017  . S/P lumbar spinal fusion 01/01/2017  . Essential hypertension 07/26/2016  . DNR (do not resuscitate) discussion   . Palliative care by specialist   . Cervical pseudoarthrosis (HCC) 06/21/2016  . Opioid dependence in remission (HCC) 06/14/2016  . Mitral valve prolapse 01/04/2016  . HNP (herniated nucleus pulposus), lumbar 12/10/2015  . Prediabetes 08/19/2015  . Overweight (BMI 25.0-29.9) 08/18/2015  .  Chronic pain 08/18/2015  . Vitamin D deficiency 08/18/2015  . Chronic fatigue disorder 06/14/2015  . S/P cervical spinal fusion 03/15/2015  . PTSD (post-traumatic stress disorder) 03/07/2014  . Severe recurrent major depression without psychotic features (HCC) 03/06/2014  . Suicide threat or attempt 03/06/2014  . Aggressive behavior   . Family history of malignant neoplasm of breast   . Rheumatoid arthritis (HCC) 12/13/2011  . Fibromyalgia 12/13/2011  . H/O cold sores 12/13/2011   Past Medical History:  Diagnosis Date  . ADD (attention deficit disorder)    on Adderal  . Aggressive behavior of adult   . Anemia   . Anxiety   . Cellulitis of breast 11/2013   RIGHT BREAST  . Childhood asthma   . 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 Chronic lower back pain   . Chronic pain    went to [facility] for pain control; stopped in 2016 " (02/23/2017)  . Cold sore   . 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 Degenerative disc disease, lumbar   . Depression    takes meds daily  . Family history of malignant neoplasm of breast   . Fibromyalgia   . History of kidney stones   . Hypertension   . Osteoporosis   . Other specified rheumatoid arthritis, right shoulder (HCC) 08/01/2011  . Pneumonia 2010?  [name] Post-nasal drip    hx of  . Pre-diabetes   . RA (rheumatoid arthritis) (HCC)    autoimmune arthritis    Family History  Problem Relation Age of Onset  . Arthritis Mother   . Heart disease Mother        ?psvt  . Breast cancer Mother 39       TAH/BSO  . Cancer Mother   . Mental illness Mother   . COPD Father   . Hypertension Father   . Alcohol abuse Father   . Mental illness Father   . Heart disease Father   . Healthy Daughter   . Breast cancer Maternal Aunt 27       deceased  . Cancer Cousin 22       female; unknown primary  . Colon cancer Paternal Aunt 44       deceased at 3  . Stomach cancer Paternal  Uncle 33       deceased at 60  . Alcohol abuse Brother   . Cancer Brother   . Hodgkin's lymphoma Brother   . Mental illness Brother   . HIV Brother   . Healthy Brother   . Healthy Son     Past Surgical History:  Procedure Laterality Date  . ANTERIOR CERVICAL DECOMP/DISCECTOMY FUSION N/A 03/15/2015   Procedure: Cervical five-six, Cerival six-seven, Anterior Cervical Discectomy and Fusion, Allograft and Plate;  Surgeon: Eldred Manges, MD;  Location: [facility] OR;  Service: Orthopedics;  Laterality: N/A;  . AUGMENTATION MAMMAPLASTY  2003  . BACK SURGERY    . BLADDER SUSPENSION  2009  . BREAST  IMPLANT REMOVAL Bilateral 10/2013  . CERVICAL WOUND DEBRIDEMENT N/A 07/07/2016   Procedure: IRRIGATION AND DEBRIDEMENT POSTERIOR NECK;  Surgeon: Eldred Manges, MD;  Location: [facility] OR;  Service: Orthopedics;  Laterality: N/A;  . COLONOSCOPY    . COMBINED ABDOMINOPLASTY AND LIPOSUCTION  2003  . INCISION AND DRAINAGE ABSCESS Right 01/16/2014   Procedure: INCISION AND DRAINAGE AND OF RIGHT BREAST ABCESS;  Surgeon: Glenna Fellows, MD;  Location: [facility];  Service: General;  Laterality: Right;  . JOINT REPLACEMENT    . LUMBAR LAMINECTOMY/DECOMPRESSION MICRODISCECTOMY N/A 12/10/2015   Procedure: Right L3-4 Hemilaminectomy, Excision of herniated nucleus pulposus;  Surgeon: Eldred Manges, MD;  Location: [facility] OR;  Service: Orthopedics;  Laterality: N/A;  . MASS EXCISION  11/03/2011   Procedure: MINOR EXCISION OF MASS;  Surgeon: Wyn Forster., MD;  Location: [facility];  Service: Orthopedics;  Laterality: Left;  debride IP joint, cyst excision left index  . MAXIMUM ACCESS (MAS) TRANSFORAMINAL LUMBAR INTERBODY FUSION (TLIF) 2 LEVEL Right 02/23/2017  . POSTERIOR CERVICAL FUSION/FORAMINOTOMY N/A 06/21/2016   Procedure: POSTERIOR CERVICAL FUSION C5-C7 SPINOUS PROCESS WIRING;  Surgeon: Eldred Manges, MD;  Location: [facility] OR;  Service: Orthopedics;  Laterality: N/A;  . POSTERIOR LUMBAR FUSION  07/2009; 07/03/2014    "L4-5; L5-S1"  . SHOULDER ARTHROSCOPY Right 2012  . TONSILLECTOMY  1987  . TOTAL SHOULDER ARTHROPLASTY  08/01/2011   Procedure: TOTAL SHOULDER ARTHROPLASTY;  Surgeon: Eulas Post, MD;  Location: [facility] OR;  Service: Orthopedics;  Laterality: Right;  Right total shoulder arthroplasty  . TUBAL LIGATION  1988   Social History   Occupational History  . Not on file  Tobacco Use  . Smoking status: Former Smoker    Packs/day: 1.00    Years: 10.00    Pack years: 10.00    Types: Cigarettes    Last attempt to quit: 12/06/2005    Years since quitting: 11.3  . Smokeless tobacco: Never Used  Substance and Sexual Activity  . Alcohol use: Yes    Comment: 02/23/2017 "might have 1-2 drinks/month"  . Drug use: No  . Sexual activity: Yes    Birth control/protection: Post-menopausal

## 2017-04-04 ENCOUNTER — Ambulatory Visit: Payer: Self-pay | Admitting: *Deleted

## 2017-04-04 NOTE — Telephone Encounter (Signed)
Pt called c/o her b/p being elevated. It was 185/100 and hr 90. While talking with the patient, she took her b/p several times, while she was lying down and after she sat up. When she sat up her b/p was 152/87 but lying as low as 132/77. She is on lisinopril.  No cardiac symptoms of headache, blurred vision or nausea, only feeling tired and woozy.  She was advised to keep a record of her b/ps to take with her for her office visit.  Home care advice given to her. Appointment made for her.

## 2017-04-04 NOTE — Telephone Encounter (Signed)
Dr aware patient to be seen.

## 2017-04-04 NOTE — Telephone Encounter (Signed)
  Reason for Disposition . [1] Systolic BP  >= 140 OR Diastolic >= 90 AND [2] taking BP medications  Answer Assessment - Initial Assessment Questions 1. BLOOD PRESSURE: "What is the blood pressure?" "Did you take at least two measurements 5 minutes apart?"     185/105 HR 90  And 10 minutes later 135/78 HR 84 and then 132/77 HR 82 2. ONSET: "When did you take your blood pressure?"     Just now 3. HOW: "How did you obtain the blood pressure?" (e.g., visiting nurse, automatic home BP monitor)   Automatic home BP monitor 4.   HISTORY: "Do you have a history of high blood pressure?"     Started this year, always had low b/p 5. MEDICATIONS: "Are you taking any medications for blood pressure?" "Have you missed any doses recently?"     Yes Lisinopril. And missed one dose a week or two ago 6. OTHER SYMPTOMS: "Do you have any symptoms?" (e.g., headache, chest pain, blurred vision, difficulty breathing, weakness)     Woozy,weakness (hs chronic fatigue and pain) 7. PREGNANCY: "Is there any chance you are pregnant?" "When was your last menstrual period?"     n/a  Protocols used: HIGH BLOOD PRESSURE-A-AH

## 2017-04-05 ENCOUNTER — Encounter (INDEPENDENT_AMBULATORY_CARE_PROVIDER_SITE_OTHER): Payer: Self-pay | Admitting: Orthopaedic Surgery

## 2017-04-09 ENCOUNTER — Ambulatory Visit: Payer: Self-pay | Admitting: Family Medicine

## 2017-04-12 ENCOUNTER — Encounter (INDEPENDENT_AMBULATORY_CARE_PROVIDER_SITE_OTHER): Payer: Self-pay | Admitting: Orthopaedic Surgery

## 2017-04-12 DIAGNOSIS — M47812 Spondylosis without myelopathy or radiculopathy, cervical region: Secondary | ICD-10-CM

## 2017-04-14 ENCOUNTER — Telehealth: Payer: Managed Care, Other (non HMO) | Admitting: Nurse Practitioner

## 2017-04-14 DIAGNOSIS — N3000 Acute cystitis without hematuria: Secondary | ICD-10-CM

## 2017-04-14 MED ORDER — CIPROFLOXACIN HCL 500 MG PO TABS: 500.0000 mg | ORAL_TABLET | Freq: Two times a day (BID) | ORAL | 0 refills | Status: DC

## 2017-04-14 NOTE — Progress Notes (Signed)

## 2017-04-16 ENCOUNTER — Telehealth (INDEPENDENT_AMBULATORY_CARE_PROVIDER_SITE_OTHER): Payer: Self-pay | Admitting: Orthopaedic Surgery

## 2017-04-16 ENCOUNTER — Other Ambulatory Visit (INDEPENDENT_AMBULATORY_CARE_PROVIDER_SITE_OTHER): Payer: Self-pay | Admitting: Orthopaedic Surgery

## 2017-04-16 DIAGNOSIS — M47812 Spondylosis without myelopathy or radiculopathy, cervical region: Secondary | ICD-10-CM

## 2017-04-16 NOTE — Telephone Encounter (Signed)
Order entered. Patient sent My Chart message as well. I advised on My Chart.

## 2017-04-16 NOTE — Telephone Encounter (Signed)
Patient called stating that Dr. Ophelia Charter left her a message in regards to scheduling a Myelogram and he told her to talk to you.  She also stated that she is very comfortable with being scheduled at [facility].  CB#[phone].  Thank you.

## 2017-04-17 ENCOUNTER — Encounter: Payer: Self-pay | Admitting: Family Medicine

## 2017-04-17 ENCOUNTER — Other Ambulatory Visit (INDEPENDENT_AMBULATORY_CARE_PROVIDER_SITE_OTHER): Payer: Self-pay | Admitting: Orthopaedic Surgery

## 2017-04-17 ENCOUNTER — Ambulatory Visit (INDEPENDENT_AMBULATORY_CARE_PROVIDER_SITE_OTHER): Payer: Managed Care, Other (non HMO) | Admitting: Family Medicine

## 2017-04-17 VITALS — BP 144/83 | HR 83 | Temp 98.1°F | Wt 163.8 lb

## 2017-04-17 DIAGNOSIS — R6882 Decreased libido: Secondary | ICD-10-CM

## 2017-04-17 DIAGNOSIS — L709 Acne, unspecified: Secondary | ICD-10-CM

## 2017-04-17 DIAGNOSIS — I10 Essential (primary) hypertension: Secondary | ICD-10-CM

## 2017-04-17 MED ORDER — TRETINOIN 0.1 % EX CREA: TOPICAL_CREAM | Freq: Every day | CUTANEOUS | 3 refills | Status: DC

## 2017-04-17 MED ORDER — LISINOPRIL 10 MG PO TABS: 15.0000 mg | ORAL_TABLET | Freq: Every day | ORAL | 1 refills | Status: DC

## 2017-04-17 NOTE — Patient Instructions (Addendum)
Increase lisinopril to 1.5 tabs a day. Monitor your BP a few times a week.   If BP > 135/85 routinely then Please be seen.   If BP remains good on new dose, follow up in 6 months.   Continue with psychologist for depression followups. I will look into your pain management referral.   Please help [place] help you:  We are honored you have chosen [facility] for your Primary Care home. Below you will find basic instructions that you may need to access in the future. Please help [place] help you by reading the instructions, which cover many of the frequent questions we experience.   Prescription refills and request:  -In order to allow more efficient response time, please call your pharmacy for all refills. They will forward the request electronically to [place]. This allows for the quickest possible response. Request left on a nurse line can take longer to refill, since these are checked as time allows between office patients and other phone calls.  - refill request can take up to 3-5 working days to complete.  - If request is sent electronically and request is appropiate, it is usually completed in 1-2 business days.  - all patients will need to be seen routinely for all chronic medical conditions requiring prescription medications (see follow-up below). If you are overdue for follow up on your condition, you will be asked to make an appointment and we will call in enough medication to cover you until your appointment (up to 30 days).  - all controlled substances will require a face to face visit to request/refill.  - if you desire your prescriptions to go through a new pharmacy, and have an active script at original pharmacy, you will need to call your pharmacy and have scripts transferred to new pharmacy. This is completed between the pharmacy locations and not by your provider.    Results: If any images or labs were ordered, it can take up to 1 week to get results depending on the test ordered and the  lab/facility running and resulting the test. - Normal or stable results, which do not need further discussion, may be released to your mychart immediately with attached note to you. A call may not be generated for normal results. Please make certain to sign up for mychart. If you have questions on how to activate your mychart you can call the front office.  - If your results need further discussion, our office will attempt to contact you via phone, and if unable to reach you after 2 attempts, we will release your abnormal result to your mychart with instructions.  - All results will be automatically released in mychart after 1 week.  - Your provider will provide you with explanation and instruction on all relevant material in your results. Please keep in mind, results and labs may appear confusing or abnormal to the untrained eye, but it does not mean they are actually abnormal for you personally. If you have any questions about your results that are not covered, or you desire more detailed explanation than what was provided, you should make an appointment with your provider to do so.   Our office handles many outgoing and incoming calls daily. If we have not contacted you within 1 week about your results, please check your mychart to see if there is a message first and if not, then contact our office.  In helping with this matter, you help decrease call volume, and therefore allow [place] to  be able to respond to patients needs more efficiently.   Acute office visits (sick visit):  An acute visit is intended for a new problem and are scheduled in shorter time slots to allow schedule openings for patients with new problems. This is the appropriate visit to discuss a new problem. In order to provide you with excellent quality medical care with proper time for you to explain your problem, have an exam and receive treatment with instructions, these appointments should be limited to one new problem per visit. If you  experience a new problem, in which you desire to be addressed, please make an acute office visit, we save openings on the schedule to accommodate you. Please do not save your new problem for any other type of visit, let us take care of it properly and quickly for you.   Follow up visits:  Depending on your condition(s) your provider will need to see you routinely in order to provide you with quality care and prescribe medication(s). Most chronic conditions (Example: hypertension, Diabetes, depression/anxiety... etc), require visits a couple times a year. Your provider will instruct you on proper follow up for your personal medical conditions and history. Please make certain to make follow up appointments for your condition as instructed. Failing to do so could result in lapse in your medication treatment/refills. If you request a refill, and are overdue to be seen on a condition, we will always provide you with a 30 day script (once) to allow you time to schedule.    Medicare wellness (well visit): - we have a wonderful Nurse Maudie Mercury), that will meet with you and provide you will yearly medicare wellness visits. These visits should occur yearly (can not be scheduled less than 1 calendar year apart) and cover preventive health, immunizations, advance directives and screenings you are entitled to yearly through your medicare benefits. Do not miss out on your entitled benefits, this is when medicare will pay for these benefits to be ordered for you.  These are strongly encouraged by your provider and is the appropriate type of visit to make certain you are up to date with all preventive health benefits. If you have not had your medicare wellness exam in the last 12 months, please make certain to schedule one by calling the office and schedule your medicare wellness with Maudie Mercury as soon as possible.   Yearly physical (well visit):  - Adults are recommended to be seen yearly for physicals. Check with your insurance  and date of your last physical, most insurances require one calendar year between physicals. Physicals include all preventive health topics, screenings, medical exam and labs that are appropriate for gender/age and history. You may have fasting labs needed at this visit. This is a well visit (not a sick visit), new problems should not be covered during this visit (see acute visit).  - Pediatric patients are seen more frequently when they are younger. Your provider will advise you on well child visit timing that is appropriate for your their age. - This is not a medicare wellness visit. Medicare wellness exams do not have an exam portion to the visit. Some medicare companies allow for a physical, some do not allow a yearly physical. If your medicare allows a yearly physical you can schedule the medicare wellness with our nurse Maudie Mercury and have your physical with your provider after, on the same day. Please check with insurance for your full benefits.   Late Policy/No Shows:  - all new patients should arrive  15-30 minutes earlier than appointment to allow [place] time  to  obtain all personal demographics,  insurance information and for you to complete office paperwork. - All established patients should arrive 10-15 minutes earlier than appointment time to update all information and be checked in .  - In our best efforts to run on time, if you are late for your appointment you will be asked to either reschedule or if able, we will work you back into the schedule. There will be a wait time to work you back in the schedule,  depending on availability.  - If you are unable to make it to your appointment as scheduled, please call 24 hours ahead of time to allow [place] to fill the time slot with someone else who needs to be seen. If you do not cancel your appointment ahead of time, you may be charged a no show fee.

## 2017-04-17 NOTE — Progress Notes (Signed)
Kara [name] , [birth year], 62 y.o., female MRN: [id number] Patient Care Team    Relationship Specialty Notifications Start End  Natalia Leatherwood, DO PCP - General Family Medicine  06/14/15   Eldred Manges, MD Consulting Physician Orthopedic Surgery  06/14/15   Milagros Evener, MD Consulting Physician Psychiatry  06/14/15     Chief Complaint  Patient presents with  . Follow-up    pt is here for f/u HTN     Subjective:  Hypertension:  Pt reports compliance with lisinopril 10 mg daily. Blood pressures ranges at home "all elevated". Patient denies chest pain, shortness of breath or lower extremity edema. Pt does not take a daily baby ASA. Pt is not prescribed statin. BMP: 02/21/2017 within normal limits CBC: 02/21/2017 mild increase in hemoglobin 15.3 Lipid: Patient never fasting. Will order next visit. Diet: Low-sodium Exercise: Not routinely RF: Hypertension  Acne: Patient has been using Retin-A 0.1% cream daily at bedtime for her acne condition and feels it is working well for her. She like refills today.  Decreased libido: Patient again discusses today that she has decreased libido and wants to try the "little pink pill. " She has declined referral to gynecology in the past. She has a family history of breast cancer.   Depression screen [facility] 2/9 04/17/2017 08/22/2016 08/18/2015  Decreased Interest 3 2 0  Down, Depressed, Hopeless 3 2 0  PHQ - 2 Score 6 4 0  Altered sleeping 0 1 -  Tired, decreased energy 3 2 -  Change in appetite 2 1 -  Feeling bad or failure about yourself  0 2 -  Trouble concentrating 3 1 -  Moving slowly or fidgety/restless 0 1 -  Suicidal thoughts 0 0 -  PHQ-9 Score 14 12 -  Difficult doing work/chores Not difficult at all Somewhat difficult -  Some recent data might be hidden    Allergies  Allergen Reactions  . Aspirin Anaphylaxis  . Ibuprofen Anaphylaxis  . Ativan [Lorazepam] Other (See Comments)    AGITATION CONFUSION   . Ketamine Other (See  Comments)    Hallucinations  . Toradol [Ketorolac Tromethamine] Itching, Swelling and Rash    UNSPECIFIED REACTION   . Tramadol Hcl Itching, Swelling and Rash    SWELLING REACTION UNSPECIFIED  Tolerates Dilaudid 06/2016.  TDD.   Social History   Tobacco Use  . Smoking status: Former Smoker    Packs/day: 1.00    Years: 10.00    Pack years: 10.00    Types: Cigarettes    Last attempt to quit: 12/06/2005    Years since quitting: 11.3  . Smokeless tobacco: Never Used  Substance Use Topics  . Alcohol use: Yes    Comment: 02/23/2017 "might have 1-2 drinks/month"   Past Medical History:  Diagnosis Date  . ADD (attention deficit disorder)    on Adderal  . Aggressive behavior of adult   . Anemia   . Anxiety   . Cellulitis of breast 11/2013   RIGHT BREAST  . Childhood asthma   . 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 Chronic lower back pain   . Chronic pain    went to [facility] for pain control; stopped in 2016 " (02/23/2017)  . Cold sore   . 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 Degenerative disc disease, lumbar   . Depression    takes meds daily  . Family history of malignant  neoplasm of breast   . Fibromyalgia   . History of kidney stones   . Hypertension   . Osteoporosis   . Other specified rheumatoid arthritis, right shoulder (HCC) 08/01/2011  . Pneumonia 2010?  [name] Post-nasal drip    hx of  . Pre-diabetes   . RA (rheumatoid arthritis) (HCC)    autoimmune arthritis   Past Surgical History:  Procedure Laterality Date  . ANTERIOR CERVICAL DECOMP/DISCECTOMY FUSION N/A 03/15/2015   Procedure: Cervical five-six, Cerival six-seven, Anterior Cervical Discectomy and Fusion, Allograft and Plate;  Surgeon: Eldred Manges, MD;  Location: [facility] OR;  Service: Orthopedics;  Laterality: N/A;  . AUGMENTATION MAMMAPLASTY  2003  . BACK SURGERY    . BLADDER SUSPENSION  2009  . BREAST IMPLANT REMOVAL Bilateral 10/2013    . CERVICAL WOUND DEBRIDEMENT N/A 07/07/2016   Procedure: IRRIGATION AND DEBRIDEMENT POSTERIOR NECK;  Surgeon: Eldred Manges, MD;  Location: [facility] OR;  Service: Orthopedics;  Laterality: N/A;  . COLONOSCOPY    . COMBINED ABDOMINOPLASTY AND LIPOSUCTION  2003  . INCISION AND DRAINAGE ABSCESS Right 01/16/2014   Procedure: INCISION AND DRAINAGE AND OF RIGHT BREAST ABCESS;  Surgeon: Glenna Fellows, MD;  Location: [facility] ORS;  Service: General;  Laterality: Right;  . JOINT REPLACEMENT    . LUMBAR LAMINECTOMY/DECOMPRESSION MICRODISCECTOMY N/A 12/10/2015   Procedure: Right L3-4 Hemilaminectomy, Excision of herniated nucleus pulposus;  Surgeon: Eldred Manges, MD;  Location: [facility] OR;  Service: Orthopedics;  Laterality: N/A;  . MASS EXCISION  11/03/2011   Procedure: MINOR EXCISION OF MASS;  Surgeon: Wyn Forster., MD;  Location: [facility];  Service: Orthopedics;  Laterality: Left;  debride IP joint, cyst excision left index  . MAXIMUM ACCESS (MAS) TRANSFORAMINAL LUMBAR INTERBODY FUSION (TLIF) 2 LEVEL Right 02/23/2017  . POSTERIOR CERVICAL FUSION/FORAMINOTOMY N/A 06/21/2016   Procedure: POSTERIOR CERVICAL FUSION C5-C7 SPINOUS PROCESS WIRING;  Surgeon: Eldred Manges, MD;  Location: [facility] OR;  Service: Orthopedics;  Laterality: N/A;  . POSTERIOR LUMBAR FUSION  07/2009; 07/03/2014   "L4-5; L5-S1"  . SHOULDER ARTHROSCOPY Right 2012  . TONSILLECTOMY  1987  . TOTAL SHOULDER ARTHROPLASTY  08/01/2011   Procedure: TOTAL SHOULDER ARTHROPLASTY;  Surgeon: Eulas Post, MD;  Location: [facility] OR;  Service: Orthopedics;  Laterality: Right;  Right total shoulder arthroplasty  . TUBAL LIGATION  1988   Family History  Problem Relation Age of Onset  . Arthritis Mother   . Heart disease Mother        ?psvt  . Breast cancer Mother 22       TAH/BSO  . Cancer Mother   . Mental illness Mother   . COPD Father   . Hypertension Father   . Alcohol abuse Father   . Mental illness Father   . Heart disease Father   .  Healthy Daughter   . Breast cancer Maternal Aunt 27       deceased  . Cancer Cousin 41       female; unknown primary  . Colon cancer Paternal Aunt 47       deceased at 79  . Stomach cancer Paternal Uncle 5       deceased at 57  . Alcohol abuse Brother   . Cancer Brother   . Hodgkin's lymphoma Brother   . Mental illness Brother   . HIV Brother   . Healthy Brother   . Healthy Son    Allergies as of 04/17/2017      Reactions  Aspirin Anaphylaxis   Ibuprofen Anaphylaxis   Ativan [lorazepam] Other (See Comments)   AGITATION CONFUSION   Ketamine Other (See Comments)   Hallucinations   Toradol [ketorolac Tromethamine] Itching, Swelling, Rash   UNSPECIFIED REACTION    Tramadol Hcl Itching, Swelling, Rash   SWELLING REACTION UNSPECIFIED  Tolerates Dilaudid 06/2016.  TDD.      Medication List        Accurate as of 04/17/17  3:25 PM. Always use your most recent med list.          acetaminophen 500 MG tablet Commonly known as:  TYLENOL Take 500 mg by mouth every 8 (eight) hours as needed for mild pain or moderate pain.   ADDERALL XR 30 MG 24 hr capsule Generic drug:  amphetamine-dextroamphetamine Take 60 mg by mouth daily.   amphetamine-dextroamphetamine 30 MG tablet Commonly known as:  ADDERALL Take 30 mg by mouth daily. At 1500   ALPRAZolam 1 MG tablet Commonly known as:  XANAX Take 1 mg by mouth 4 (four) times daily as needed for anxiety (takes for pain which causes anxiety).   cholecalciferol 1000 units tablet Commonly known as:  VITAMIN D Take 1,000 Units by mouth daily.   cyclobenzaprine 10 MG tablet Commonly known as:  FLEXERIL Take 10 mg by mouth 3 (three) times daily as needed for muscle spasms.   Diclofenac Sodium 2 % Soln Commonly known as:  PENNSAID Place 2 g onto the skin 4 (four) times daily as needed.   hydroxychloroquine 200 MG tablet Commonly known as:  PLAQUENIL Take 1 tablet by mouth daily.   lisinopril 10 MG tablet Commonly known as:   PRINIVIL,ZESTRIL Take 1 tablet (10 mg total) by mouth daily.   meloxicam 15 MG tablet Commonly known as:  MOBIC TAKE 1 TABLET BY MOUTH EVERY DAY   methocarbamol 500 MG tablet Commonly known as:  ROBAXIN Take 1 tablet (500 mg total) by mouth every 6 (six) hours as needed for muscle spasms.   ORENCIA CLICKJECT 125 MG/ML Soaj Generic drug:  Abatacept   pyridoxine 100 MG tablet Commonly known as:  B-6 Take 100 mg by mouth 2 (two) times daily.   REDNESS RELIEF MAX STRENGTH OP Apply 1 drop to eye as needed (irritaion / redness).   SAVELLA 25 MG Tabs Generic drug:  Milnacipran HCl Take 10 mg by mouth 3 (three) times a week.   tretinoin 0.1 % cream Commonly known as:  RETIN-A Apply topically at bedtime.   valACYclovir 1000 MG tablet Commonly known as:  VALTREX Take two tablets onset of coldsore and repeat 2 tablets in 12 hours   venlafaxine XR 75 MG 24 hr capsule Commonly known as:  EFFEXOR-XR Take 225 mg by mouth daily with breakfast. Takes 3 tablets   zolpidem 10 MG tablet Commonly known as:  AMBIEN TAKE 1 TABLET (10 mg) BY MOUTH AT BEDTIME       All past medical history, surgical history, allergies, family history, immunizations andmedications were updated in the EMR today and reviewed under the history and medication portions of their EMR.     ROS: Negative, with the exception of above mentioned in HPI   Objective:  BP (!) 144/83 (BP Location: Right Arm, Patient Position: Sitting, Cuff Size: Normal)   Pulse 83   Temp 98.1 F (36.7 C) (Oral)   Wt 163 lb 12.8 oz (74.3 kg)   SpO2 99%   BMI 28.12 kg/m  Body mass index is 28.12 kg/m. Gen: Afebrile. No acute distress. Nontoxic  in appearance, well-developed, well-nourished, Caucasian female. HENT: AT. [place]. MMM. Eyes:Pupils Equal Round Reactive to light, Extraocular movements intact,  Conjunctiva without redness, discharge or icterus. CV: RRR no murmur, no edema, +2/4 P posterior tibialis pulses Chest: CTAB, no  wheeze or crackles Abd: Soft. NTND. BS present.  Neuro:  Normal gait. PERLA. EOMi. Alert. Oriented x3    No exam data present No results found. No results found for this or any previous visit (from the past 24 hour(s)).  Assessment/Plan: Kara [name] is a 62 y.o. female present for OV for  Essential hypertension - Elevated today. Increase lisinopril from 10 mg daily to 15 mg daily. - Low-sodium diet, routine exercise. - Labs are up-to-date. - Monitor blood pressures at home. If blood pressure above 135/85 routinely that she needs to be seen again. Otherwise follow-up in 6 months.  Decreased libido: Briefly discussed again today. Her best options are to be referred to gynecology so that they can discuss medications and safety with them. Today she is agreeable and referral to gynecology was placed.  Pain:  Patient again asked for pain medications. She's been advised we will not provide chronic narcotic prescriptions. She has been seen with her surgeon and referred to pain management by this office on 01/26/2017 and [facility] denied her. We continue to search for pain management for her.   Reviewed expectations re: course of current medical issues.  Discussed self-management of symptoms.  Outlined signs and symptoms indicating need for more acute intervention.  Patient verbalized understanding and all questions were answered.  Patient received an After-Visit Summary.   Note is dictated utilizing voice recognition software. Although note has been proof read prior to signing, occasional typographical errors still can be missed. If any questions arise, please do not hesitate to call for verification.   electronically signed by:  Felix Pacini, DO  [facility] - OR

## 2017-04-17 NOTE — Telephone Encounter (Signed)
Please advise 

## 2017-04-19 ENCOUNTER — Telehealth: Payer: Self-pay | Admitting: *Deleted

## 2017-04-19 ENCOUNTER — Ambulatory Visit: Payer: Managed Care, Other (non HMO) | Admitting: Neurology

## 2017-04-19 NOTE — Telephone Encounter (Signed)
No showed new patient appointment. 

## 2017-04-20 ENCOUNTER — Emergency Department (HOSPITAL_COMMUNITY)
Admission: EM | Admit: 2017-04-20 | Discharge: 2017-04-21 | Discharge status: Against Medical Advice | Payer: Managed Care, Other (non HMO) | Attending: Emergency Medicine | Admitting: Emergency Medicine

## 2017-04-20 ENCOUNTER — Other Ambulatory Visit: Payer: Self-pay

## 2017-04-20 ENCOUNTER — Encounter (HOSPITAL_COMMUNITY): Payer: Self-pay

## 2017-04-20 ENCOUNTER — Encounter: Payer: Self-pay | Admitting: Neurology

## 2017-04-20 ENCOUNTER — Encounter: Payer: Self-pay | Admitting: Family Medicine

## 2017-04-20 DIAGNOSIS — Z5321 Procedure and treatment not carried out due to patient leaving prior to being seen by health care provider: Secondary | ICD-10-CM | POA: Insufficient documentation

## 2017-04-20 DIAGNOSIS — R102 Pelvic and perineal pain: Secondary | ICD-10-CM | POA: Insufficient documentation

## 2017-04-20 LAB — CBC WITH DIFFERENTIAL/PLATELET
BASOS ABS: 0 10*3/uL (ref 0.0–0.1)
BASOS PCT: 0 %
Eosinophils Absolute: 0.1 10*3/uL (ref 0.0–0.7)
Eosinophils Relative: 1 %
HCT: 40.1 % (ref 36.0–46.0)
Hemoglobin: 13 g/dL (ref 12.0–15.0)
Lymphocytes Relative: 31 %
Lymphs Abs: 3.2 10*3/uL (ref 0.7–4.0)
MCH: 26.8 pg (ref 26.0–34.0)
MCHC: 32.4 g/dL (ref 30.0–36.0)
MCV: 82.7 fL (ref 78.0–100.0)
Monocytes Absolute: 0.8 10*3/uL (ref 0.1–1.0)
Monocytes Relative: 8 %
NEUTROS PCT: 60 %
Neutro Abs: 6.1 10*3/uL (ref 1.7–7.7)
Platelets: 495 10*3/uL — ABNORMAL HIGH (ref 150–400)
RBC: 4.85 MIL/uL (ref 3.87–5.11)
RDW: 13.4 % (ref 11.5–15.5)
WBC: 10.2 10*3/uL (ref 4.0–10.5)

## 2017-04-20 LAB — URINALYSIS, ROUTINE W REFLEX MICROSCOPIC
Bacteria, UA: NONE SEEN
Bilirubin Urine: NEGATIVE
GLUCOSE, UA: NEGATIVE mg/dL
KETONES UR: NEGATIVE mg/dL
NITRITE: NEGATIVE
Protein, ur: NEGATIVE mg/dL
Specific Gravity, Urine: 1.008 (ref 1.005–1.030)
pH: 5 (ref 5.0–8.0)

## 2017-04-20 LAB — COMPREHENSIVE METABOLIC PANEL
ALK PHOS: 119 U/L (ref 38–126)
ALT: 20 U/L (ref 14–54)
AST: 26 U/L (ref 15–41)
Albumin: 4.4 g/dL (ref 3.5–5.0)
Anion gap: 13 (ref 5–15)
BUN: 14 mg/dL (ref 6–20)
CALCIUM: 9.7 mg/dL (ref 8.9–10.3)
CHLORIDE: 101 mmol/L (ref 101–111)
CO2: 23 mmol/L (ref 22–32)
Creatinine, Ser: 0.68 mg/dL (ref 0.44–1.00)
Glucose, Bld: 107 mg/dL — ABNORMAL HIGH (ref 65–99)
Potassium: 3.6 mmol/L (ref 3.5–5.1)
Sodium: 137 mmol/L (ref 135–145)
Total Bilirubin: 0.7 mg/dL (ref 0.3–1.2)
Total Protein: 7.8 g/dL (ref 6.5–8.1)

## 2017-04-20 LAB — HM DEXA SCAN

## 2017-04-20 NOTE — ED Triage Notes (Signed)
Pt endorses right sided pelvic pain x 3 days. Pt has hx of RA, spondylitis, and chronic pain. Denies urinary sx or n/v/d.

## 2017-04-20 NOTE — ED Notes (Signed)
Pt did not respond for room 

## 2017-04-25 ENCOUNTER — Telehealth (INDEPENDENT_AMBULATORY_CARE_PROVIDER_SITE_OTHER): Payer: Self-pay | Admitting: Orthopaedic Surgery

## 2017-04-25 ENCOUNTER — Inpatient Hospital Stay: Admission: RE | Admit: 2017-04-25 | Payer: Self-pay | Source: Ambulatory Visit

## 2017-04-25 ENCOUNTER — Other Ambulatory Visit: Payer: Self-pay

## 2017-04-25 NOTE — Telephone Encounter (Signed)
Toniann Fail looked into procedure scheduling and saw that patient appt for CT Myelogram Cervical Spine was cancelled due to insurance denial. Notes state we will have to work on peer to peer. Per Dr. Ophelia Charter, he is unable to prescribe anything for patient at this time. She really needs to have this test.  His advice is for patient to call insurance company as well.

## 2017-04-25 NOTE — Telephone Encounter (Signed)
Patient called wanting Dr Ophelia Charter to know that she can not turn her neck. Patient asked if Dr Ophelia Charter want to see her in the office. Patient also asked if Dr Ophelia Charter would  prescribe her something for the pain. The number to contact patient is [phone]

## 2017-04-27 NOTE — Telephone Encounter (Signed)
Patient finally has CT scheduled for 2/6, she wants to know if you can work her in anywhere to go over results with Dr. Ophelia Charter unless Fayrene Fearing can see her because I told her the next available is 02/19. She was also asking about pain medication. Please advise # [phone]

## 2017-04-27 NOTE — Telephone Encounter (Signed)
Please advise on pain medication. No earlier follow up available as you are out of the office.

## 2017-04-30 ENCOUNTER — Encounter: Payer: Self-pay | Admitting: Family Medicine

## 2017-05-01 ENCOUNTER — Ambulatory Visit (INDEPENDENT_AMBULATORY_CARE_PROVIDER_SITE_OTHER): Payer: Managed Care, Other (non HMO) | Admitting: Orthopaedic Surgery

## 2017-05-01 ENCOUNTER — Telehealth: Payer: Self-pay | Admitting: Family Medicine

## 2017-05-01 ENCOUNTER — Telehealth (INDEPENDENT_AMBULATORY_CARE_PROVIDER_SITE_OTHER): Payer: Self-pay | Admitting: Orthopaedic Surgery

## 2017-05-01 DIAGNOSIS — M8580 Other specified disorders of bone density and structure, unspecified site: Secondary | ICD-10-CM

## 2017-05-01 NOTE — Telephone Encounter (Signed)
Spoke with patient reviewed bone density results ,instructions and information. Patient verbalized understanding. Patient willing to try fosamax

## 2017-05-01 NOTE — Telephone Encounter (Signed)
noted 

## 2017-05-01 NOTE — Telephone Encounter (Signed)
Hold on pain meds. I can review and call her after scan report back to me.

## 2017-05-01 NOTE — Telephone Encounter (Signed)
Patient has her myelogram tomorrow.  Dr. Ophelia Charter does not have any appointments available until the 26th to review her procedure.  Patient wanted to know if there is something that shows on her test, then she will come in, if not, she doesn't want to make an appointment just to review it since it is so far out.  CB#[phone].  Thank you.

## 2017-05-01 NOTE — Telephone Encounter (Signed)
Please call pt: - her bone density resulted with very mild bone softening only.  - recs: continue vit D and calcium supplement. Weight bearing exercise is helpful (when able). If she wanted to also try fosamax for preventive therapy (once weekly dose), we can start medication. It helps prevent progression to osteoporosis. Please advise.

## 2017-05-02 ENCOUNTER — Ambulatory Visit
Admission: RE | Admit: 2017-05-02 | Discharge: 2017-05-02 | Disposition: A | Discharge status: Home / Self Care | Payer: Managed Care, Other (non HMO) | Source: Ambulatory Visit | Attending: Orthopaedic Surgery | Admitting: Orthopaedic Surgery

## 2017-05-02 VITALS — BP 95/54 | HR 64

## 2017-05-02 DIAGNOSIS — M47812 Spondylosis without myelopathy or radiculopathy, cervical region: Secondary | ICD-10-CM

## 2017-05-02 DIAGNOSIS — Z981 Arthrodesis status: Secondary | ICD-10-CM

## 2017-05-02 MED ORDER — DIAZEPAM 5 MG PO TABS
10.0000 mg | ORAL_TABLET | Freq: Once | ORAL | Status: AC
  Administered 2017-05-02: 10 mg via ORAL

## 2017-05-02 MED ORDER — HYDROMORPHONE HCL 1 MG/ML IJ SOLN
1.0000 mg | Freq: Once | INTRAMUSCULAR | Status: AC
  Administered 2017-05-02: 1 mg via INTRAMUSCULAR

## 2017-05-02 MED ORDER — IOPAMIDOL (ISOVUE-M 300) INJECTION 61%
10.0000 mL | Freq: Once | INTRAMUSCULAR | Status: AC | PRN
  Administered 2017-05-02: 10 mL via INTRATHECAL

## 2017-05-02 MED ORDER — HYDROXYZINE HCL 50 MG/ML IM SOLN
25.0000 mg | Freq: Once | INTRAMUSCULAR | Status: AC
  Administered 2017-05-02: 25 mg via INTRAMUSCULAR

## 2017-05-02 MED ORDER — ALENDRONATE SODIUM 35 MG PO TABS: 35.0000 mg | ORAL_TABLET | ORAL | 3 refills | Status: DC

## 2017-05-02 NOTE — Telephone Encounter (Signed)
I called times 2 . No answer. CT /myelo done and reviewed. Will try tomorrow. I left her message.

## 2017-05-02 NOTE — Telephone Encounter (Signed)
Please see below and advise. Would you like patient to make return appt?

## 2017-05-02 NOTE — Discharge Instructions (Signed)
Myelogram Discharge Instructions  1. Go home and rest quietly for the next 24 hours.  It is important to lie flat for the next 24 hours.  Get up only to go to the restroom.  You may lie in the bed or on a couch on your back, your stomach, your left side or your right side.  You may have one pillow under your head.  You may have pillows between your knees while you are on your side or under your knees while you are on your back.  2. DO NOT drive today.  Recline the seat as far back as it will go, while still wearing your seat belt, on the way home.  3. You may get up to go to the bathroom as needed.  You may sit up for 10 minutes to eat.  You may resume your normal diet and medications unless otherwise indicated.  Drink lots of extra fluids today and tomorrow.  4. The incidence of headache, nausea, or vomiting is about 5% (one in 20 patients).  If you develop a headache, lie flat and drink plenty of fluids until the headache goes away.  Caffeinated beverages may be helpful.  If you develop severe nausea and vomiting or a headache that does not go away with flat bed rest, call [phone].  5. You may resume normal activities after your 24 hours of bed rest is over; however, do not exert yourself strongly or do any heavy lifting tomorrow. If when you get up you have a headache when standing, go back to bed and force fluids for another 24 hours.  6. Call your physician for a follow-up appointment.  The results of your myelogram will be sent directly to your physician by the following day.  7. If you have any questions or if complications develop after you arrive home, please call [phone].  Discharge instructions have been explained to the patient.  The patient, or the person responsible for the patient, fully understands these instructions.  YOU MAY RESTART YOUR ADDERALL, SAVELLA AND EFFEXOR TOMORROW 05/03/2017 AT 8:00AM.

## 2017-05-02 NOTE — Progress Notes (Signed)
Patient states she has been off Adderall, Savella and Effexor for at least the past two days.

## 2017-05-03 ENCOUNTER — Other Ambulatory Visit: Payer: Self-pay

## 2017-05-03 ENCOUNTER — Emergency Department (HOSPITAL_COMMUNITY)
Admission: EM | Admit: 2017-05-03 | Discharge: 2017-05-05 | Disposition: A | Discharge status: Home / Self Care | Payer: Managed Care, Other (non HMO) | Attending: Emergency Medicine | Admitting: Emergency Medicine

## 2017-05-03 ENCOUNTER — Encounter (HOSPITAL_COMMUNITY): Payer: Self-pay | Admitting: Emergency Medicine

## 2017-05-03 DIAGNOSIS — F909 Attention-deficit hyperactivity disorder, unspecified type: Secondary | ICD-10-CM | POA: Diagnosis not present

## 2017-05-03 DIAGNOSIS — Z96611 Presence of right artificial shoulder joint: Secondary | ICD-10-CM | POA: Diagnosis not present

## 2017-05-03 DIAGNOSIS — F329 Major depressive disorder, single episode, unspecified: Secondary | ICD-10-CM | POA: Diagnosis present

## 2017-05-03 DIAGNOSIS — Z87891 Personal history of nicotine dependence: Secondary | ICD-10-CM | POA: Insufficient documentation

## 2017-05-03 DIAGNOSIS — I10 Essential (primary) hypertension: Secondary | ICD-10-CM | POA: Diagnosis not present

## 2017-05-03 DIAGNOSIS — Z79899 Other long term (current) drug therapy: Secondary | ICD-10-CM | POA: Diagnosis not present

## 2017-05-03 DIAGNOSIS — F332 Major depressive disorder, recurrent severe without psychotic features: Secondary | ICD-10-CM | POA: Insufficient documentation

## 2017-05-03 DIAGNOSIS — F419 Anxiety disorder, unspecified: Secondary | ICD-10-CM | POA: Diagnosis not present

## 2017-05-03 LAB — CBC
HCT: 40.1 % (ref 36.0–46.0)
Hemoglobin: 12.7 g/dL (ref 12.0–15.0)
MCH: 26.7 pg (ref 26.0–34.0)
MCHC: 31.7 g/dL (ref 30.0–36.0)
MCV: 84.4 fL (ref 78.0–100.0)
PLATELETS: 352 10*3/uL (ref 150–400)
RBC: 4.75 MIL/uL (ref 3.87–5.11)
RDW: 14.2 % (ref 11.5–15.5)
WBC: 7.8 10*3/uL (ref 4.0–10.5)

## 2017-05-03 LAB — COMPREHENSIVE METABOLIC PANEL
ALT: 20 U/L (ref 14–54)
AST: 34 U/L (ref 15–41)
Albumin: 4.1 g/dL (ref 3.5–5.0)
Alkaline Phosphatase: 106 U/L (ref 38–126)
Anion gap: 13 (ref 5–15)
BUN: 16 mg/dL (ref 6–20)
CALCIUM: 9.7 mg/dL (ref 8.9–10.3)
CO2: 25 mmol/L (ref 22–32)
Chloride: 103 mmol/L (ref 101–111)
Creatinine, Ser: 1.01 mg/dL — ABNORMAL HIGH (ref 0.44–1.00)
GFR calc non Af Amer: 59 mL/min — ABNORMAL LOW (ref >60)
GLUCOSE: 104 mg/dL — AB (ref 65–99)
Potassium: 4 mmol/L (ref 3.5–5.1)
SODIUM: 141 mmol/L (ref 135–145)
Total Bilirubin: 0.4 mg/dL (ref 0.3–1.2)
Total Protein: 7.3 g/dL (ref 6.5–8.1)

## 2017-05-03 LAB — ETHANOL: Alcohol, Ethyl (B): <10 mg/dL (ref <10)

## 2017-05-03 NOTE — Telephone Encounter (Signed)
I called and discussed results leaving her voice mail. She did not pick up. Will call back in 2 wks.

## 2017-05-03 NOTE — ED Triage Notes (Signed)
Pt states she is depressed, suicidal, and homicidal after he husband had an affair and due to her chronic diseases.

## 2017-05-04 LAB — RAPID URINE DRUG SCREEN, HOSP PERFORMED
AMPHETAMINES: POSITIVE — AB
BENZODIAZEPINES: POSITIVE — AB
Barbiturates: NOT DETECTED
Cocaine: NOT DETECTED
OPIATES: NOT DETECTED
TETRAHYDROCANNABINOL: POSITIVE — AB

## 2017-05-04 MED ORDER — MIDAZOLAM HCL 2 MG/2ML IJ SOLN
2.0000 mg | Freq: Once | INTRAMUSCULAR | Status: AC
  Administered 2017-05-04: 2 mg via INTRAMUSCULAR
  Filled 2017-05-04: qty 2

## 2017-05-04 MED ORDER — ALPRAZOLAM 0.5 MG PO TABS
1.0000 mg | ORAL_TABLET | Freq: Three times a day (TID) | ORAL | Status: DC | PRN
  Administered 2017-05-04: 1 mg via ORAL
  Filled 2017-05-04: qty 4

## 2017-05-04 MED ORDER — ZIPRASIDONE MESYLATE 20 MG IM SOLR
20.0000 mg | Freq: Once | INTRAMUSCULAR | Status: AC
Start: 2017-05-04 — End: 2017-05-04
  Administered 2017-05-04: 20 mg via INTRAMUSCULAR

## 2017-05-04 MED ORDER — HALOPERIDOL LACTATE 5 MG/ML IJ SOLN
5.0000 mg | Freq: Once | INTRAMUSCULAR | Status: AC
Start: 2017-05-04 — End: 2017-05-04
  Administered 2017-05-04: 5 mg via INTRAMUSCULAR
  Filled 2017-05-04: qty 1

## 2017-05-04 MED ORDER — ALPRAZOLAM 0.25 MG PO TABS
0.2500 mg | ORAL_TABLET | Freq: Once | ORAL | Status: AC
  Administered 2017-05-04: 0.25 mg via ORAL
  Filled 2017-05-04: qty 1

## 2017-05-04 MED ORDER — MORPHINE SULFATE (PF) 4 MG/ML IV SOLN
4.0000 mg | Freq: Once | INTRAVENOUS | Status: AC
  Administered 2017-05-04: 4 mg via INTRAMUSCULAR
  Filled 2017-05-04: qty 1

## 2017-05-04 MED ORDER — LISINOPRIL 2.5 MG PO TABS
15.0000 mg | ORAL_TABLET | Freq: Every day | ORAL | Status: DC
  Filled 2017-05-04: qty 1

## 2017-05-04 MED ORDER — OLANZAPINE 10 MG IM SOLR
5.0000 mg | Freq: Once | INTRAMUSCULAR | Status: AC
  Administered 2017-05-04: 5 mg via INTRAMUSCULAR
  Filled 2017-05-04: qty 10

## 2017-05-04 MED ORDER — DIPHENHYDRAMINE HCL 50 MG/ML IJ SOLN: 50.0000 mg | Freq: Once | INTRAMUSCULAR | Status: DC

## 2017-05-04 MED ORDER — CYCLOBENZAPRINE HCL 10 MG PO TABS
10.0000 mg | ORAL_TABLET | Freq: Three times a day (TID) | ORAL | Status: DC | PRN
  Administered 2017-05-05: 10 mg via ORAL
  Filled 2017-05-04: qty 1

## 2017-05-04 MED ORDER — HYDROXYCHLOROQUINE SULFATE 200 MG PO TABS
200.0000 mg | ORAL_TABLET | Freq: Every day | ORAL | Status: DC
  Administered 2017-05-05: 200 mg via ORAL
  Filled 2017-05-04 (×2): qty 1

## 2017-05-04 MED ORDER — MILNACIPRAN HCL 25 MG PO TABS: 10.0000 mg | ORAL_TABLET | ORAL | Status: DC

## 2017-05-04 MED ORDER — HALOPERIDOL LACTATE 5 MG/ML IJ SOLN
5.0000 mg | Freq: Once | INTRAMUSCULAR | Status: AC
  Administered 2017-05-04: 5 mg via INTRAMUSCULAR

## 2017-05-04 MED ORDER — DIPHENHYDRAMINE HCL 50 MG/ML IJ SOLN
25.0000 mg | Freq: Once | INTRAMUSCULAR | Status: AC
  Administered 2017-05-04: 25 mg via INTRAMUSCULAR
  Filled 2017-05-04: qty 1

## 2017-05-04 MED ORDER — ZOLPIDEM TARTRATE 5 MG PO TABS
5.0000 mg | ORAL_TABLET | Freq: Every evening | ORAL | Status: DC | PRN
  Filled 2017-05-04: qty 1

## 2017-05-04 MED ORDER — VENLAFAXINE HCL ER 75 MG PO CP24
225.0000 mg | ORAL_CAPSULE | Freq: Every day | ORAL | Status: DC
  Administered 2017-05-05: 225 mg via ORAL
  Filled 2017-05-04 (×2): qty 1

## 2017-05-04 MED ORDER — VITAMIN B-6 100 MG PO TABS
100.0000 mg | ORAL_TABLET | Freq: Two times a day (BID) | ORAL | Status: DC
  Administered 2017-05-05: 100 mg via ORAL
  Filled 2017-05-04 (×4): qty 1

## 2017-05-04 NOTE — ED Provider Notes (Signed)
I was asked to see patient while she was awaiting psychiatric admission to address her pain.  She has had chronic back pain that is not new.  I advised her that since she was not on chronic medicines at this time, I would not be able to restart any narcotics.  I offered her Tylenol which she refused she then became very agitated, yelling and demanding that she see the chief of orthopedic surgery immediately.  She then ran out of her room completely naked and tried to escape going to the MRI machine.  She was assisted back to her room.  I have  enacted IVC paperwork.  Due to her agitation, threats to herself and to staff, I have ordered Haldol and Benadryl. I suspect this is due to her underlying psychiatric condition, she is otherwise been medically stable.  CRITICAL CARE Performed by: Joya Gaskins Total critical care time: 31 minutes Critical care time was exclusive of separately billable procedures and treating other patients. Critical care was necessary to treat or prevent imminent or life-threatening deterioration. Critical care was time spent personally by me on the following activities: development of treatment plan with patient and/or surrogate as well as nursing, discussions with consultants, evaluation of patient's response to treatment, examination of patient, obtaining history from patient or surrogate, ordering and performing treatments and interventions, ordering and review of laboratory studies, ordering and review of radiographic studies, pulse oximetry and re-evaluation of patient's condition. Patient received Haldol due to agitation and concern for her safety   Zadie Rhine, MD 05/04/17 [phone]

## 2017-05-04 NOTE — ED Notes (Signed)
Regular Lunch Tray Ordered @ 1304-per RN

## 2017-05-04 NOTE — ED Notes (Signed)
Patient still screaming loudly. Sitter at bedside.

## 2017-05-04 NOTE — ED Provider Notes (Signed)
Patient still acting out, attacking staff and spitting on staff.  Four-point restraints were ordered due to her violent behavior.  She still demanding pain medicines for her chronic pain. She has one-to-one sitter at this time Patient still awake and alert.   Zadie Rhine, MD 05/04/17 [phone]

## 2017-05-04 NOTE — ED Notes (Signed)
Pt demanding to see physician at this time re: pain medication. MD wickline updated

## 2017-05-04 NOTE — ED Notes (Signed)
Pt's belongings placed in locker #3.  

## 2017-05-04 NOTE — ED Provider Notes (Signed)
[facility] EMERGENCY DEPARTMENT Provider Note   CSN: [id number] Arrival date & time: 05/03/17  2236     History   Chief Complaint Chief Complaint  Patient presents with  . Depression  . Homicidal  . Suicidal    HPI Kara [name] is a 62 y.o. female.  The history is provided by the patient.  Depression  This is a recurrent problem. The current episode started yesterday. The problem occurs constantly. The problem has been gradually worsening. Pertinent negatives include no chest pain, no abdominal pain and no headaches. Nothing aggravates the symptoms. Nothing relieves the symptoms.   Patient reports feeling depressed, due to husband's infidelity.  She initially told nursing she was suicidal, but denies suicidality at this time she also feels depressed due to her chronic illnesses at this time. She reports mild low back pain, but no other new pain at this time Past Medical History:  Diagnosis Date  . ADD (attention deficit disorder)    on Adderal  . Aggressive behavior of adult   . Anemia   . Anxiety   . Cellulitis of breast 11/2013   RIGHT BREAST  . Childhood asthma   . 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 Chronic lower back pain   . Chronic pain    went to [facility] for pain control; stopped in 2016 " (02/23/2017)  . Cold sore   . 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 Degenerative disc disease, lumbar   . Depression    takes meds daily  . Family history of malignant neoplasm of breast   . Fibromyalgia   . History of kidney stones   . Hypertension   . Osteoporosis   . Other specified rheumatoid arthritis, right shoulder (HCC) 08/01/2011  . Pneumonia 2010?  [name] Post-nasal drip    hx of  . Pre-diabetes   . RA (rheumatoid arthritis) (HCC)    autoimmune arthritis    Patient Active Problem List   Diagnosis Date Noted  . Decreased libido 03/29/2017  . Lumbar stenosis  02/23/2017  . Spinal stenosis of lumbar region with neurogenic claudication 02/19/2017  . S/P lumbar spinal fusion 01/01/2017  . Essential hypertension 07/26/2016  . DNR (do not resuscitate) discussion   . Palliative care by specialist   . Cervical pseudoarthrosis (HCC) 06/21/2016  . Opioid dependence in remission (HCC) 06/14/2016  . Mitral valve prolapse 01/04/2016  . HNP (herniated nucleus pulposus), lumbar 12/10/2015  . Prediabetes 08/19/2015  . Overweight (BMI 25.0-29.9) 08/18/2015  . Chronic pain 08/18/2015  . Vitamin D deficiency 08/18/2015  . Chronic fatigue disorder 06/14/2015  . S/P cervical spinal fusion 03/15/2015  . PTSD (post-traumatic stress disorder) 03/07/2014  . Severe recurrent major depression without psychotic features (HCC) 03/06/2014  . Suicide threat or attempt 03/06/2014  . Aggressive behavior   . Family history of malignant neoplasm of breast   . Rheumatoid arthritis (HCC) 12/13/2011  . Fibromyalgia 12/13/2011  . H/O cold sores 12/13/2011    Past Surgical History:  Procedure Laterality Date  . ANTERIOR CERVICAL DECOMP/DISCECTOMY FUSION N/A 03/15/2015   Procedure: Cervical five-six, Cerival six-seven, Anterior Cervical Discectomy and Fusion, Allograft and Plate;  Surgeon: Eldred Manges, MD;  Location: [facility] OR;  Service: Orthopedics;  Laterality: N/A;  . AUGMENTATION MAMMAPLASTY  2003  . BACK SURGERY    . BLADDER SUSPENSION  2009  . BREAST IMPLANT REMOVAL Bilateral 10/2013  . CERVICAL WOUND  DEBRIDEMENT N/A 07/07/2016   Procedure: IRRIGATION AND DEBRIDEMENT POSTERIOR NECK;  Surgeon: Eldred Manges, MD;  Location: [facility] OR;  Service: Orthopedics;  Laterality: N/A;  . COLONOSCOPY    . COMBINED ABDOMINOPLASTY AND LIPOSUCTION  2003  . INCISION AND DRAINAGE ABSCESS Right 01/16/2014   Procedure: INCISION AND DRAINAGE AND OF RIGHT BREAST ABCESS;  Surgeon: Glenna Fellows, MD;  Location: [facility] ORS;  Service: General;  Laterality: Right;  . JOINT REPLACEMENT    . LUMBAR  LAMINECTOMY/DECOMPRESSION MICRODISCECTOMY N/A 12/10/2015   Procedure: Right L3-4 Hemilaminectomy, Excision of herniated nucleus pulposus;  Surgeon: Eldred Manges, MD;  Location[facility] OR;  Service: Orthopedics;  Laterality: N/A;  . MASS EXCISION  11/03/2011   Procedure: MINOR EXCISION OF MASS;  Surgeon: Wyn Forster., MD;  Location: [facility];  Service: Orthopedics;  Laterality: Left;  debride IP joint, cyst excision left index  . MAXIMUM ACCESS (MAS) TRANSFORAMINAL LUMBAR INTERBODY FUSION (TLIF) 2 LEVEL Right 02/23/2017  . POSTERIOR CERVICAL FUSION/FORAMINOTOMY N/A 06/21/2016   Procedure: POSTERIOR CERVICAL FUSION C5-C7 SPINOUS PROCESS WIRING;  Surgeon: Eldred Manges, MD;  Location: [facility] OR;  Service: Orthopedics;  Laterality: N/A;  . POSTERIOR LUMBAR FUSION  07/2009; 07/03/2014   "L4-5; L5-S1"  . SHOULDER ARTHROSCOPY Right 2012  . TONSILLECTOMY  1987  . TOTAL SHOULDER ARTHROPLASTY  08/01/2011   Procedure: TOTAL SHOULDER ARTHROPLASTY;  Surgeon: Eulas Post, MD;  Location: [facility] OR;  Service: Orthopedics;  Laterality: Right;  Right total shoulder arthroplasty  . TUBAL LIGATION  1988    OB History    No data available       Home Medications    Prior to Admission medications   Medication Sig Start Date End Date Taking? Authorizing Provider  acetaminophen (TYLENOL) 500 MG tablet Take 500 mg by mouth every 8 (eight) hours as needed for mild pain or moderate pain.    [provider]  ADDERALL XR 30 MG 24 hr capsule Take 60 mg by mouth daily.  11/02/15   [provider]  alendronate (FOSAMAX) 35 MG tablet Take 1 tablet (35 mg total) by mouth every 7 (seven) days. Take with a full glass of water on an empty stomach. 05/02/17   Kuneff, Renee A, DO  ALPRAZolam (XANAX) 1 MG tablet Take 1 mg by mouth 4 (four) times daily as needed for anxiety (takes for pain which causes anxiety).  07/06/14   [provider]  amphetamine-dextroamphetamine (ADDERALL) 30 MG tablet Take  30 mg by mouth daily. At 1500 04/29/16   [provider]  cholecalciferol (VITAMIN D) 1000 units tablet Take 1,000 Units by mouth daily.    [provider]  cyclobenzaprine (FLEXERIL) 10 MG tablet Take 10 mg by mouth 3 (three) times daily as needed for muscle spasms.    [provider]  hydroxychloroquine (PLAQUENIL) 200 MG tablet Take 1 tablet by mouth daily. 03/23/17   [provider]  lisinopril (PRINIVIL,ZESTRIL) 10 MG tablet Take 1.5 tablets (15 mg total) by mouth daily. 04/17/17   Kuneff, Renee A, DO  meloxicam (MOBIC) 15 MG tablet TAKE 1 TABLET BY MOUTH EVERY DAY 03/14/17   Kerrin Champagne, MD  methocarbamol (ROBAXIN) 500 MG tablet Take 1 tablet (500 mg total) by mouth every 6 (six) hours as needed for muscle spasms. 02/23/17   Eldred Manges, MD  Naphazoline-Glycerin (REDNESS RELIEF MAX STRENGTH OP) Apply 1 drop to eye as needed (irritaion / redness).    [provider]  ORENCIA CLICKJECT 125 MG/ML SOAJ  12/04/16   [provider]  pyridoxine (B-6) 100 MG tablet Take 100 mg by mouth 2 (two) times daily.    [provider]  SAVELLA 25 MG TABS Take 10 mg by mouth 3 (three) times a week.  12/19/16   [provider]  tretinoin (RETIN-A) 0.1 % cream Apply topically at bedtime. 04/17/17   Kuneff, Renee A, DO  valACYclovir (VALTREX) 1000 MG tablet Take two tablets onset of coldsore and repeat 2 tablets in 12 hours 01/26/17   Kuneff, Renee A, DO  venlafaxine XR (EFFEXOR-XR) 75 MG 24 hr capsule Take 225 mg by mouth daily with breakfast. Takes 3 tablets    [provider]  zolpidem (AMBIEN) 10 MG tablet TAKE 1 TABLET (10 mg) BY MOUTH AT BEDTIME 05/23/15   [provider]    Family History Family History  Problem Relation Age of Onset  . Arthritis Mother   . Heart disease Mother        ?psvt  . Breast cancer Mother 81       TAH/BSO  . Cancer Mother   . Mental illness Mother   . COPD Father   . Hypertension Father    . Alcohol abuse Father   . Mental illness Father   . Heart disease Father   . Healthy Daughter   . Breast cancer Maternal Aunt 27       deceased  . Cancer Cousin 28       female; unknown primary  . Colon cancer Paternal Aunt 23       deceased at 74  . Stomach cancer Paternal Uncle 73       deceased at 41  . Alcohol abuse Brother   . Cancer Brother   . Hodgkin's lymphoma Brother   . Mental illness Brother   . HIV Brother   . Healthy Brother   . Healthy Son     Social History Social History   Tobacco Use  . Smoking status: Former Smoker    Packs/day: 1.00    Years: 10.00    Pack years: 10.00    Types: Cigarettes    Last attempt to quit: 12/06/2005    Years since quitting: 11.4  . Smokeless tobacco: Never Used  Substance Use Topics  . Alcohol use: Yes    Comment: 02/23/2017 "might have 1-2 drinks/month"  . Drug use: Yes    Types: Marijuana    Comment: "i smoke marijuana to avoid opiates"      Allergies   Aspirin; Ibuprofen; Ativan [lorazepam]; Ketamine; Toradol [ketorolac tromethamine]; and Tramadol hcl   Review of Systems Review of Systems  Constitutional: Negative for fever.  Cardiovascular: Negative for chest pain.  Gastrointestinal: Negative for abdominal pain.  Musculoskeletal: Positive for back pain.  Neurological: Negative for headaches.  Psychiatric/Behavioral: Positive for depression. The patient is nervous/anxious.   All other systems reviewed and are negative.    Physical Exam Updated Vital Signs BP (!) 137/112 (BP Location: Right Arm)   Pulse 84   Temp 98.1 F (36.7 C) (Oral)   Resp (!) 22   Ht 1.626 m (5\' 4" )   Wt 74.4 kg (164 lb)   SpO2 99%   BMI 28.15 kg/m   Physical Exam CONSTITUTIONAL: Chronically ill-appearing, anxious HEAD: Normocephalic/atraumatic EYES: EOMI ENMT: Mucous membranes moist NECK: supple no meningeal signs SPINE/BACK: Well-healed lumbar scar CV: S1/S2 noted, no murmurs/rubs/gallops noted LUNGS: Lungs are  clear to auscultation bilaterally, no apparent distress ABDOMEN:  soft, nontender NEURO: Pt is awake/alert/appropriate, moves all extremitiesx4.  No facial droop.   EXTREMITIES: pulses normal/equal, full ROM SKIN: warm, color normal PSYCH: Anxious  ED Treatments / Results  Labs (all labs ordered are listed, but only abnormal results are displayed) Labs Reviewed  COMPREHENSIVE METABOLIC PANEL - Abnormal; Notable for the following components:      Result Value   Glucose, Bld 104 (*)    Creatinine, Ser 1.01 (*)    GFR calc non Af Amer 59 (*)    All other components within normal limits  ETHANOL  CBC  RAPID URINE DRUG SCREEN, HOSP PERFORMED    EKG  EKG Interpretation None       Radiology  Procedures Procedures   Medications Ordered in ED Medications  cyclobenzaprine (FLEXERIL) tablet 10 mg (not administered)  hydroxychloroquine (PLAQUENIL) tablet 200 mg (not administered)  lisinopril (PRINIVIL,ZESTRIL) tablet 15 mg (not administered)  pyridOXINE (VITAMIN B-6) tablet 100 mg (100 mg Oral Not Given 05/04/17 0132)  venlafaxine XR (EFFEXOR-XR) 24 hr capsule 225 mg (not administered)  zolpidem (AMBIEN) tablet 5 mg (not administered)  ALPRAZolam (XANAX) tablet 1 mg (not administered)  ALPRAZolam (XANAX) tablet 0.25 mg (0.25 mg Oral Given 05/04/17 0132)     Initial Impression / Assessment and Plan / ED Course  I have reviewed the triage vital signs and the nursing notes.  Pertinent labs results that were available during my care of the patient were reviewed by me and considered in my medical decision making (see chart for details).     12:52 AM Patient medically stable.  We will consult psychiatry. 1:55 AM Behavioral health recommends inpatient treatment.  Patient stable at this time Final Clinical Impressions(s) / ED Diagnoses   Final diagnoses:  Anxiety    ED Discharge Orders    None       Zadie Rhine, MD 05/04/17 [phone]
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
## 2017-05-04 NOTE — ED Notes (Signed)
Patient is too agitated for EKG at this time. Continues to scream out. Restraints in place. Sitter at bedside.

## 2017-05-04 NOTE — ED Notes (Signed)
Pt refuses Remus Loffler stating "this doesn't do anything". Encouraged pt to try and get some sleep, pt refuses [place]

## 2017-05-04 NOTE — ED Notes (Signed)
Pt is agitated and screaming in bed, emotional support offered. Pt states she doesn't want to be on this earth any more, is inconsolably sobbing. Plan to admin anxiolytic as ordered PRN

## 2017-05-04 NOTE — ED Notes (Signed)
Pt up at door in room speaking loudly to this RN stating "you didn't do anything for me, I take 4 xanax at home-1 mg and you gave me nothing". Explained to pt that she just received 1 mg of xanax as ordered and she may need to give it some time to work. Encouraged pt to lay in bed a take some deep breaths to relax. Pt seated in chair at this time,

## 2017-05-04 NOTE — ED Notes (Signed)
Pt requesting pain medication for chronic back pain stating "I have 4 screws in my back". Explained to pt request was communicated to MD.

## 2017-05-04 NOTE — ED Notes (Signed)
Pt resting quietly, bilateral ankle restraints removed. No agitation at this time.

## 2017-05-04 NOTE — BH Assessment (Signed)
[facility] Assessment Progress Note   TTS consulted with Nira Conn, NP who recommends inpt treatment. Pt's nurse Amy, RN advised of disposition. Amy, RN states she will advise EDP Zadie Rhine, MD of the disposition as he is currently unavailable to speak with TTS at this time.  Princess Bruins, MSW, LCSW Therapeutic Triage Specialist  [phone]

## 2017-05-04 NOTE — Progress Notes (Addendum)
Patient chart reviewed.  Patient meets criteria for inpatient treatment.  [facility] does not have an appropriate bed currently but will review when beds are available.  Pt referrals also sent to the following hospitals:   [facility]  [facility]  [facility]   ON WAITLIST     Disposition CSW will continue to follow for placement.  Timmothy Euler. Kaylyn Lim, MSW, LCSWA Disposition Clinical Social Work [phone] (cell) [phone] (office)

## 2017-05-04 NOTE — ED Notes (Signed)
Bilateral wrist restraints removed, pt continues to rest quietly, no agitation noted. A&O x 4

## 2017-05-04 NOTE — Progress Notes (Addendum)
Tresa Endo from [facility], called to request medical information on patient.  CSW called pt's nurse, Lindwood Qua, RN, and asked her to return call to [facility] @[phone].  CSW will continue to follow for placement.  [phone]. Timmothy Euler, MSW, LCSWA Disposition Clinical Social Work [phone] (cell) [phone] (office)  Addendum @12 :54 CSW called [email or web address]  to see if they are accepting patient.  The MD at [facility] would like to review again once the patient is able to be out of restraints.  Pt is currently still in restraints.

## 2017-05-05 MED ORDER — ZIPRASIDONE MESYLATE 20 MG IM SOLR: 20.0000 mg | INTRAMUSCULAR | Status: DC | PRN

## 2017-05-05 MED ORDER — OLANZAPINE 5 MG PO TBDP
5.0000 mg | ORAL_TABLET | Freq: Three times a day (TID) | ORAL | Status: DC | PRN
  Administered 2017-05-05: 5 mg via ORAL
  Filled 2017-05-05: qty 1

## 2017-05-05 NOTE — ED Notes (Signed)
Dr Jacqulyn Bath assessed pt and rescinded IVC papers.

## 2017-05-05 NOTE — ED Notes (Addendum)
Pt becoming increasingly loud, pt advised that there are other people and she needs to be more quiet. Pt continues to become more agitated. Assisted back to her room. Security and [employer] present. Dr. Blinda Leatherwood aware.

## 2017-05-05 NOTE — [facility] Counselor (Signed)
Pt denies SI. Pt denies HI and AVH.  Pt states she has been irate about her husband's 4th affair. Pt states she has been taking medication to sleep because of chronic pain and the stress of her marriage but she does not want to harm herself. Pt states she has a therapist/psychiatrist and plans to follow-up with her provider.  Collateral contact from Joneen Caraway the Pt's husband. Mr. Lynam collaborates the Pt's information. He reports a new extra-martial affair. States that the Pt became very upset when she found out. Mr. Rounds states that he does not feel the Pt would harm herself or him but she needed to have some alone time. Mr. Bayles states he would like to pick up his wife so she can go home and go to her therapy appointment.   Inetta Fermo, NP recommends D/C and follow-up with current providers.  Wolfgang Phoenix, [facility] Triage Specialist

## 2017-05-05 NOTE — ED Notes (Signed)
Spouse has arrived - brought pt clothing to change into.

## 2017-05-05 NOTE — ED Notes (Signed)
Pt called spouse x 2 - pt noted to be talking loudly and asking for him to come pick her up. Also states she is going to make him move out d/t he cheated on her a year ago then states 2 years ago. Pt ambulated back to room - lying on stretcher - yelling and crying "He went out w/his mistress! I am sick and tired of this!" Allowed pt to vent feelings.

## 2017-05-05 NOTE — ED Notes (Signed)
Pt called spouse to come pick her up and returned to room. Lying on bed - noted to be crying and moaning loudly.

## 2017-05-05 NOTE — ED Notes (Addendum)
Pt on phone at nurses' desk - talking w/her spouse in loud voice. Asked pt to lower voice. \[id number]\
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
## 2017-05-05 NOTE — ED Notes (Signed)
Pt ambulatory to the bathroom without difficulty.

## 2017-05-05 NOTE — ED Notes (Signed)
Re-TTS being performed.  

## 2017-05-05 NOTE — Discharge Instructions (Signed)
Outpatient Psychiatry and Counseling  [facility]: Mobile Crisis Management 24 hours:  [phone]  [facility] sliding scale fee and walk in schedule: M-F 8am-12pm/1pm-3pm [address]  [place], [place] [address] [phone]  [facility] [address] [place], [place] [address] [phone]  [facility] new patient walk-in appointments available Monday - Friday 8am -3pm.          [address] [place], [place] [address] [phone] or crisis line- [phone]  [facility] Outpatient Services/ Intensive Outpatient Therapy Program [address] [place], [place] [address] [phone]  [facility]                  Crisis Services      [phone]      [address]     [place], [place] [address]                 [facility]   [facility] [phone] [address] [place], [place] [address]   [facility]          [address],  [place], [place] [address]       [phone]  [facility] [address] [place], [place] [address] [phone]  [facility]    [address]    [place], [place] [address]     [phone]       Parish McKinney, MD     [address]     [place] [address]     [phone]       [facility] [address] [place] [address]  [facility]     [address]     [place], [place]      [phone]       [facility] Shamsher Ahluwalia, MD [address] [place], [place] [address] [phone]  [facility]     [address]     [place], [place] [address]     [phone]       [facility] [address] [place], [place] [address] [phone] Resources for Temporary  Residential Assistance/Crisis Centers  [facility] ([facility]) M-F 8am-3pm   [address] [place], [place] [address]   [phone] Services include: laundry, barbering, support groups, case management, phone  & computer access, showers, AA/NA mtgs, mental health/substance abuse nurse, job skills class, disability information, VA assistance, spiritual classes, etc.   HOMELESS SHELTERS  [facility]     [facility]   [address], [place]     [phone]              [facility] (women and children)       [address] [place], [place] [address] [phone] [email or web address] for application and process Application Required  [facility]   [address]    [place] [address]     [phone]                    [facility] [address] [place], [place] [address] [phone](schedule application appt.) Application Required  [facility] (women only)    [address]     [place],  [address]       [phone]      Intake starts 6pm daily Need valid ID, SSC, & Police report [facility] [place] [address] [place], [place] [phone] Application Required  [facility] (men only)     [address]      [place], [place]     [phone]       [facility] (Pregnant women only) [address] [place], [place] [phone]  [facility]      [address]      [place], [place] [address]     [phone]             [facility] [facility] [address] [place], [place] [phone] 90 day commitment/SA/Application process  [facility](men only)     [address]     [place], [place]     [phone]       Check-in at 7pm            [facility] [address] [place], [place] [address] [phone] Men/Women/Women and Children  must be there by 7 pm  [facility] [place], [place] [phone]                 

## 2017-05-05 NOTE — ED Notes (Signed)
Pt and spouse state pt's glasses were not in security envelope as documented. States they are prescription glasses. Advised will notify security.

## 2017-05-05 NOTE — ED Notes (Addendum)
Pt on phone w/Brandi, [facility] Counselor. Pt denying attempting to hurt herself. States "I took 6 Xanax so I could go to sleep".

## 2017-05-06 ENCOUNTER — Encounter (INDEPENDENT_AMBULATORY_CARE_PROVIDER_SITE_OTHER): Payer: Self-pay | Admitting: Orthopaedic Surgery

## 2017-05-07 ENCOUNTER — Other Ambulatory Visit: Payer: Self-pay

## 2017-05-08 ENCOUNTER — Other Ambulatory Visit (INDEPENDENT_AMBULATORY_CARE_PROVIDER_SITE_OTHER): Payer: Self-pay | Admitting: Specialist

## 2017-05-08 NOTE — Telephone Encounter (Signed)
Meloxicam Refill Request

## 2017-05-14 ENCOUNTER — Emergency Department (HOSPITAL_COMMUNITY)
Admission: EM | Admit: 2017-05-14 | Discharge: 2017-05-14 | Disposition: A | Discharge status: Home / Self Care | Payer: Managed Care, Other (non HMO) | Attending: Emergency Medicine | Admitting: Emergency Medicine

## 2017-05-14 ENCOUNTER — Other Ambulatory Visit: Payer: Self-pay

## 2017-05-14 ENCOUNTER — Encounter (HOSPITAL_COMMUNITY): Payer: Self-pay | Admitting: Emergency Medicine

## 2017-05-14 DIAGNOSIS — E876 Hypokalemia: Secondary | ICD-10-CM

## 2017-05-14 DIAGNOSIS — T50902A Poisoning by unspecified drugs, medicaments and biological substances, intentional self-harm, initial encounter: Secondary | ICD-10-CM

## 2017-05-14 DIAGNOSIS — Z96611 Presence of right artificial shoulder joint: Secondary | ICD-10-CM | POA: Insufficient documentation

## 2017-05-14 DIAGNOSIS — D649 Anemia, unspecified: Secondary | ICD-10-CM

## 2017-05-14 DIAGNOSIS — I10 Essential (primary) hypertension: Secondary | ICD-10-CM | POA: Insufficient documentation

## 2017-05-14 DIAGNOSIS — E162 Hypoglycemia, unspecified: Secondary | ICD-10-CM | POA: Insufficient documentation

## 2017-05-14 DIAGNOSIS — J45909 Unspecified asthma, uncomplicated: Secondary | ICD-10-CM | POA: Insufficient documentation

## 2017-05-14 DIAGNOSIS — F332 Major depressive disorder, recurrent severe without psychotic features: Secondary | ICD-10-CM | POA: Diagnosis present

## 2017-05-14 DIAGNOSIS — Z79899 Other long term (current) drug therapy: Secondary | ICD-10-CM | POA: Insufficient documentation

## 2017-05-14 DIAGNOSIS — Z87891 Personal history of nicotine dependence: Secondary | ICD-10-CM | POA: Insufficient documentation

## 2017-05-14 DIAGNOSIS — R45851 Suicidal ideations: Secondary | ICD-10-CM | POA: Diagnosis not present

## 2017-05-14 LAB — CBC WITH DIFFERENTIAL/PLATELET
Basophils Absolute: 0 10*3/uL (ref 0.0–0.1)
Basophils Relative: 1 %
EOS ABS: 0.2 10*3/uL (ref 0.0–0.7)
Eosinophils Relative: 2 %
HCT: 34.4 % — ABNORMAL LOW (ref 36.0–46.0)
HEMOGLOBIN: 10.9 g/dL — AB (ref 12.0–15.0)
LYMPHS ABS: 2.4 10*3/uL (ref 0.7–4.0)
LYMPHS PCT: 30 %
MCH: 26.3 pg (ref 26.0–34.0)
MCHC: 31.7 g/dL (ref 30.0–36.0)
MCV: 82.9 fL (ref 78.0–100.0)
MONOS PCT: 8 %
Monocytes Absolute: 0.6 10*3/uL (ref 0.1–1.0)
NEUTROS PCT: 59 %
Neutro Abs: 4.9 10*3/uL (ref 1.7–7.7)
Platelets: 348 10*3/uL (ref 150–400)
RBC: 4.15 MIL/uL (ref 3.87–5.11)
RDW: 14.1 % (ref 11.5–15.5)
WBC: 8.1 10*3/uL (ref 4.0–10.5)

## 2017-05-14 LAB — RAPID URINE DRUG SCREEN, HOSP PERFORMED
AMPHETAMINES: POSITIVE — AB
BARBITURATES: NOT DETECTED
Benzodiazepines: POSITIVE — AB
COCAINE: NOT DETECTED
Opiates: NOT DETECTED
Tetrahydrocannabinol: POSITIVE — AB

## 2017-05-14 LAB — COMPREHENSIVE METABOLIC PANEL
ALBUMIN: 3.6 g/dL (ref 3.5–5.0)
ALK PHOS: 85 U/L (ref 38–126)
ALT: 22 U/L (ref 14–54)
AST: 24 U/L (ref 15–41)
Anion gap: 7 (ref 5–15)
BUN: 14 mg/dL (ref 6–20)
CALCIUM: 8.9 mg/dL (ref 8.9–10.3)
CO2: 25 mmol/L (ref 22–32)
CREATININE: 0.71 mg/dL (ref 0.44–1.00)
Chloride: 108 mmol/L (ref 101–111)
GFR calc Af Amer: >60 mL/min (ref >60)
GFR calc non Af Amer: >60 mL/min (ref >60)
GLUCOSE: 89 mg/dL (ref 65–99)
Potassium: 3.1 mmol/L — ABNORMAL LOW (ref 3.5–5.1)
Sodium: 140 mmol/L (ref 135–145)
Total Bilirubin: 0.2 mg/dL — ABNORMAL LOW (ref 0.3–1.2)
Total Protein: 6.8 g/dL (ref 6.5–8.1)

## 2017-05-14 LAB — ETHANOL: Alcohol, Ethyl (B): 10 mg/dL — ABNORMAL HIGH (ref <10)

## 2017-05-14 LAB — CBG MONITORING, ED
Glucose-Capillary: 64 mg/dL — ABNORMAL LOW (ref 65–99)
Glucose-Capillary: 70 mg/dL (ref 65–99)
Glucose-Capillary: 77 mg/dL (ref 65–99)

## 2017-05-14 LAB — ACETAMINOPHEN LEVEL

## 2017-05-14 LAB — I-STAT BETA HCG BLOOD, ED (MC, WL, AP ONLY): I-stat hCG, quantitative: <5 m[IU]/mL (ref <5)

## 2017-05-14 LAB — SALICYLATE LEVEL: Salicylate Lvl: <7 mg/dL (ref 2.8–30.0)

## 2017-05-14 MED ORDER — ZIPRASIDONE MESYLATE 20 MG IM SOLR
10.0000 mg | Freq: Once | INTRAMUSCULAR | Status: AC
Start: 2017-05-14 — End: 2017-05-14
  Administered 2017-05-14: 10 mg via INTRAMUSCULAR
  Filled 2017-05-14: qty 20

## 2017-05-14 MED ORDER — LISINOPRIL 20 MG PO TABS
20.0000 mg | ORAL_TABLET | Freq: Every day | ORAL | Status: DC
  Filled 2017-05-14: qty 1

## 2017-05-14 MED ORDER — DEXTROSE 50 % IV SOLN: 12.5000 g | Freq: Once | INTRAVENOUS | Status: DC

## 2017-05-14 MED ORDER — VITAMIN B-6 100 MG PO TABS
100.0000 mg | ORAL_TABLET | Freq: Two times a day (BID) | ORAL | Status: DC
  Filled 2017-05-14: qty 1

## 2017-05-14 MED ORDER — POTASSIUM CHLORIDE CRYS ER 20 MEQ PO TBCR
40.0000 meq | EXTENDED_RELEASE_TABLET | Freq: Once | ORAL | Status: AC
  Administered 2017-05-14: 40 meq via ORAL

## 2017-05-14 MED ORDER — MIDAZOLAM HCL 2 MG/2ML IJ SOLN
2.0000 mg | Freq: Once | INTRAMUSCULAR | Status: AC
  Administered 2017-05-14: 2 mg via INTRAMUSCULAR
  Filled 2017-05-14: qty 2

## 2017-05-14 MED ORDER — CHLORPROMAZINE HCL 25 MG/ML IJ SOLN
50.0000 mg | Freq: Once | INTRAMUSCULAR | Status: AC | PRN
  Administered 2017-05-14: 50 mg via INTRAMUSCULAR
  Filled 2017-05-14: qty 2

## 2017-05-14 MED ORDER — HYDROXYZINE HCL 25 MG PO TABS: 25.0000 mg | ORAL_TABLET | Freq: Three times a day (TID) | ORAL | Status: DC

## 2017-05-14 MED ORDER — ALPRAZOLAM 1 MG PO TABS
1.0000 mg | ORAL_TABLET | Freq: Once | ORAL | Status: AC
  Administered 2017-05-14: 1 mg via ORAL
  Filled 2017-05-14: qty 2

## 2017-05-14 MED ORDER — VENLAFAXINE HCL ER 75 MG PO CP24: 225.0000 mg | ORAL_CAPSULE | Freq: Every day | ORAL | Status: DC

## 2017-05-14 MED ORDER — POTASSIUM CHLORIDE CRYS ER 20 MEQ PO TBCR
40.0000 meq | EXTENDED_RELEASE_TABLET | Freq: Once | ORAL | Status: DC
  Filled 2017-05-14: qty 2

## 2017-05-14 MED ORDER — TRAZODONE HCL 50 MG PO TABS: 50.0000 mg | ORAL_TABLET | Freq: Every day | ORAL | Status: DC

## 2017-05-14 MED ORDER — MIDAZOLAM HCL 2 MG/2ML IJ SOLN: 4.0000 mg | Freq: Once | INTRAMUSCULAR | Status: DC

## 2017-05-14 MED ORDER — STERILE WATER FOR INJECTION IJ SOLN
INTRAMUSCULAR | Status: AC
  Administered 2017-05-14: 1.2 mL
  Filled 2017-05-14: qty 10

## 2017-05-14 MED ORDER — ZIPRASIDONE MESYLATE 20 MG IM SOLR
10.0000 mg | Freq: Once | INTRAMUSCULAR | Status: AC
  Administered 2017-05-14: 10 mg via INTRAMUSCULAR
  Filled 2017-05-14: qty 20

## 2017-05-14 MED ORDER — DIPHENHYDRAMINE HCL 50 MG/ML IJ SOLN
50.0000 mg | Freq: Once | INTRAMUSCULAR | Status: AC | PRN
  Administered 2017-05-14: 50 mg via INTRAMUSCULAR
  Filled 2017-05-14: qty 1

## 2017-05-14 MED ORDER — STERILE WATER FOR INJECTION IJ SOLN
INTRAMUSCULAR | Status: AC
  Administered 2017-05-14: 0.7 mL
  Filled 2017-05-14: qty 10

## 2017-05-14 NOTE — ED Notes (Signed)
Pt now states that she only took one ambien to help her sleep.  Pt denies SI at this time.  Sitter at bedside.

## 2017-05-14 NOTE — ED Notes (Signed)
Pt is still very aggressive and yelling at staff. We have asked pt to remain calm for 15 minutes to re evaluate restraints,pt has outburst 6 minutes later stating she is having a panic attack .

## 2017-05-14 NOTE — ED Notes (Signed)
Pt continues to be verbally aggressive and demanding "Dilaudid"  She wants to see Dr. Westley Chandler.  I explained that while here she has to see our physicians.  We have asked her multiple times to stop yelling.  Security and [employer] here.

## 2017-05-14 NOTE — Progress Notes (Signed)
Patient ID: Kara [name], female   DOB: July 02, 1955, 62 y.o.   MRN: [id number]  This writer spoke with Dr Sharma Covert regarding patient's behavior, yelling and screaming, ripping her scrub top open, yelling on the telephone, drug seeking, and being demanding and intrusive. Dr Sharma Covert suggested to contact the EDP to have Pt's IVC rescinded and discharge Pt. Pt is well known to this hospital system with 7 ED admissions in the past six months with these same behaviors which all center around her need for medication and her husband's supposed affairs. Dr Adela Lank, EDP was contacted and has agreed to rescind Pt's IVC.  Laveda Abbe, NP-C 05-14-2017       [phone]

## 2017-05-14 NOTE — ED Notes (Signed)
Spoke with Revonda Standard at [employer], poison control recommendation:   Monitor for possible respiratory depression  obs time 6 hours or when patient returns to baseline Half life increased with overdoses, may take longer for medicine to clear

## 2017-05-14 NOTE — ED Notes (Signed)
One bag of belongings given to [facility] staff with pt transfer to 34.

## 2017-05-14 NOTE — ED Notes (Signed)
Spoke with poison control and they will close her case out on their end.

## 2017-05-14 NOTE — ED Notes (Signed)
Bed: WA19 Expected date:  Expected time:  Means of arrival:  Comments: EMS-OD 

## 2017-05-14 NOTE — ED Notes (Signed)
Patient somnolent but arousable. In NAD.

## 2017-05-14 NOTE — ED Notes (Signed)
Pt discharged safely with Husband.  Pt was in no distress but she was very irritable.  Pt was walked out the front door and left with husband.  All belongings were returned to patient.
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
## 2017-05-14 NOTE — Telephone Encounter (Signed)
Message left about results.

## 2017-05-14 NOTE — ED Provider Notes (Addendum)
Received patient in turnover from Dr. Preston Fleeting.  Please see their note for further details of Hx, PE.  Briefly patient is a 62 y.o. female with a Suicidal and Drug Overdose .  Took 30 ambien once her husband brought his girlfriend home in an attempt to harm herself.  Current plan is to obs for 6 hours per poison control and then medically clear for tts eval.  Patient continues to be awake alert.  Started to become agitated, attacking staff will give geodon, IVC.  Feel medically clear at this time. Will have TTS eval.    Initially TTS recommended inpatient admission.  When the patient was evaluated by the psychiatry team they felt that the patient was malingering, felt that this was a persistent issue for her she is now asking for IV narcotics and benzos.  They recommended that her IVC paperwork be rescinded and she be discharged home.  3:21 PM:  I have discussed the diagnosis/risks/treatment options with the patient and believe the pt to be eligible for discharge home to follow-up with PCP. We also discussed returning to the ED immediately if new or worsening sx occur. We discussed the sx which are most concerning (e.g., sudden worsening pain, fever, inability to tolerate by mouth) that necessitate immediate return. Medications administered to the patient during their visit and any new prescriptions provided to the patient are listed below.  Medications given during this visit Medications  potassium chloride SA (K-DUR,KLOR-CON) CR tablet 40 mEq (0 mEq Oral Hold 05/14/17 0453)  lisinopril (PRINIVIL,ZESTRIL) tablet 20 mg (20 mg Oral Not Given 05/14/17 1158)  pyridOXINE (VITAMIN B-6) tablet 100 mg (100 mg Oral Not Given 05/14/17 1159)  venlafaxine XR (EFFEXOR-XR) 24 hr capsule 225 mg (not administered)  hydrOXYzine (ATARAX/VISTARIL) tablet 25 mg (25 mg Oral Refused 05/14/17 1423)  traZODone (DESYREL) tablet 50 mg (not administered)  potassium chloride SA (K-DUR,KLOR-CON) CR tablet 40 mEq (40 mEq Oral Given  05/14/17 0806)  ziprasidone (GEODON) injection 10 mg (10 mg Intramuscular Given by Other 05/14/17 0925)  sterile water (preservative free) injection (1.2 mLs  Given by Other 05/14/17 0926)  ziprasidone (GEODON) injection 10 mg (10 mg Intramuscular Given 05/14/17 1055)  sterile water (preservative free) injection (0.7 mLs  Given 05/14/17 1055)  midazolam (VERSED) injection 2 mg (2 mg Intramuscular Given 05/14/17 1158)  ALPRAZolam (XANAX) tablet 1 mg (1 mg Oral Given 05/14/17 1336)  chlorproMAZINE (THORAZINE) injection 50 mg (50 mg Intramuscular Given 05/14/17 1413)  diphenhydrAMINE (BENADRYL) injection 50 mg (50 mg Intramuscular Given 05/14/17 1414)     The patient appears reasonably screen and/or stabilized for discharge and I doubt any other medical condition or other [facility] requiring further screening, evaluation, or treatment in the ED at this time prior to discharge.     Melene Plan, DO 05/14/17 3212    Melene Plan, DO 05/14/17 1521
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
## 2017-05-14 NOTE — ED Notes (Signed)
PT aware of urine sample 

## 2017-05-14 NOTE — ED Notes (Signed)
Pt's belongings checked and is at the nurses station.  Pt has clothes, sandels, a jacket, and red purse. No phone or glasses found in pt's belongings.
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
## 2017-05-14 NOTE — ED Notes (Signed)
Pt oriented to room and unit.  Pt is angry and loud.  Pt denies S/I, H/I, and AVH.  Pt denies earlier suicide attempt claiming she took one ambien in order to sleep.  Pt is cursing and yelling from her bed disturbing the unit.
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
## 2017-05-21 ENCOUNTER — Telehealth: Payer: Managed Care, Other (non HMO) | Admitting: Family

## 2017-05-21 DIAGNOSIS — R399 Unspecified symptoms and signs involving the genitourinary system: Secondary | ICD-10-CM

## 2017-05-21 MED ORDER — CEPHALEXIN 500 MG PO CAPS: 500.0000 mg | ORAL_CAPSULE | Freq: Two times a day (BID) | ORAL | 0 refills | Status: DC

## 2017-05-21 NOTE — Progress Notes (Signed)

## 2017-05-29 ENCOUNTER — Ambulatory Visit: Payer: Managed Care, Other (non HMO) | Admitting: Obstetrics & Gynecology

## 2017-05-29 DIAGNOSIS — Z0289 Encounter for other administrative examinations: Secondary | ICD-10-CM

## 2017-06-14 ENCOUNTER — Telehealth: Payer: Managed Care, Other (non HMO) | Admitting: Physician Assistant

## 2017-06-14 DIAGNOSIS — R399 Unspecified symptoms and signs involving the genitourinary system: Secondary | ICD-10-CM

## 2017-06-14 MED ORDER — CEPHALEXIN 500 MG PO CAPS: 500.0000 mg | ORAL_CAPSULE | Freq: Two times a day (BID) | ORAL | 0 refills | Status: DC

## 2017-06-14 NOTE — Progress Notes (Signed)

## 2017-06-20 ENCOUNTER — Encounter (HOSPITAL_COMMUNITY): Payer: Self-pay

## 2017-06-20 ENCOUNTER — Other Ambulatory Visit: Payer: Self-pay

## 2017-06-20 ENCOUNTER — Emergency Department (HOSPITAL_COMMUNITY)
Admission: EM | Admit: 2017-06-20 | Discharge: 2017-06-20 | Disposition: A | Discharge status: Home / Self Care | Payer: Managed Care, Other (non HMO) | Attending: Emergency Medicine | Admitting: Emergency Medicine

## 2017-06-20 DIAGNOSIS — F419 Anxiety disorder, unspecified: Secondary | ICD-10-CM | POA: Diagnosis not present

## 2017-06-20 DIAGNOSIS — F329 Major depressive disorder, single episode, unspecified: Secondary | ICD-10-CM | POA: Diagnosis present

## 2017-06-20 DIAGNOSIS — Z96611 Presence of right artificial shoulder joint: Secondary | ICD-10-CM | POA: Diagnosis not present

## 2017-06-20 DIAGNOSIS — Z87891 Personal history of nicotine dependence: Secondary | ICD-10-CM | POA: Diagnosis not present

## 2017-06-20 DIAGNOSIS — Z046 Encounter for general psychiatric examination, requested by authority: Secondary | ICD-10-CM | POA: Insufficient documentation

## 2017-06-20 DIAGNOSIS — Z79899 Other long term (current) drug therapy: Secondary | ICD-10-CM | POA: Diagnosis not present

## 2017-06-20 DIAGNOSIS — J45909 Unspecified asthma, uncomplicated: Secondary | ICD-10-CM | POA: Insufficient documentation

## 2017-06-20 DIAGNOSIS — R451 Restlessness and agitation: Secondary | ICD-10-CM | POA: Diagnosis not present

## 2017-06-20 DIAGNOSIS — R45850 Homicidal ideations: Secondary | ICD-10-CM | POA: Diagnosis not present

## 2017-06-20 DIAGNOSIS — I10 Essential (primary) hypertension: Secondary | ICD-10-CM | POA: Insufficient documentation

## 2017-06-20 DIAGNOSIS — R079 Chest pain, unspecified: Secondary | ICD-10-CM | POA: Diagnosis not present

## 2017-06-20 DIAGNOSIS — R220 Localized swelling, mass and lump, head: Secondary | ICD-10-CM | POA: Insufficient documentation

## 2017-06-20 DIAGNOSIS — R456 Violent behavior: Secondary | ICD-10-CM | POA: Diagnosis not present

## 2017-06-20 DIAGNOSIS — F32A Depression, unspecified: Secondary | ICD-10-CM

## 2017-06-20 LAB — CBC
HCT: 36.9 % (ref 36.0–46.0)
Hemoglobin: 11.6 g/dL — ABNORMAL LOW (ref 12.0–15.0)
MCH: 26.1 pg (ref 26.0–34.0)
MCHC: 31.4 g/dL (ref 30.0–36.0)
MCV: 82.9 fL (ref 78.0–100.0)
PLATELETS: 380 10*3/uL (ref 150–400)
RBC: 4.45 MIL/uL (ref 3.87–5.11)
RDW: 15.3 % (ref 11.5–15.5)
WBC: 14.3 10*3/uL — ABNORMAL HIGH (ref 4.0–10.5)

## 2017-06-20 LAB — ACETAMINOPHEN LEVEL: Acetaminophen (Tylenol), Serum: <10 ug/mL — ABNORMAL LOW (ref 10–30)

## 2017-06-20 LAB — COMPREHENSIVE METABOLIC PANEL
ALK PHOS: 95 U/L (ref 38–126)
ALT: 18 U/L (ref 14–54)
AST: 26 U/L (ref 15–41)
Albumin: 3.8 g/dL (ref 3.5–5.0)
Anion gap: 10 (ref 5–15)
BILIRUBIN TOTAL: 0.2 mg/dL — AB (ref 0.3–1.2)
BUN: 20 mg/dL (ref 6–20)
CALCIUM: 8.9 mg/dL (ref 8.9–10.3)
CHLORIDE: 106 mmol/L (ref 101–111)
CO2: 24 mmol/L (ref 22–32)
CREATININE: 0.86 mg/dL (ref 0.44–1.00)
GFR calc Af Amer: >60 mL/min (ref >60)
Glucose, Bld: 89 mg/dL (ref 65–99)
Potassium: 3.8 mmol/L (ref 3.5–5.1)
Sodium: 140 mmol/L (ref 135–145)
Total Protein: 6.9 g/dL (ref 6.5–8.1)

## 2017-06-20 LAB — RAPID URINE DRUG SCREEN, HOSP PERFORMED
AMPHETAMINES: POSITIVE — AB
Barbiturates: NOT DETECTED
Benzodiazepines: POSITIVE — AB
Cocaine: NOT DETECTED
Opiates: NOT DETECTED
TETRAHYDROCANNABINOL: POSITIVE — AB

## 2017-06-20 LAB — SALICYLATE LEVEL: Salicylate Lvl: <7 mg/dL (ref 2.8–30.0)

## 2017-06-20 LAB — URINALYSIS, ROUTINE W REFLEX MICROSCOPIC
BILIRUBIN URINE: NEGATIVE
GLUCOSE, UA: NEGATIVE mg/dL
KETONES UR: NEGATIVE mg/dL
Nitrite: NEGATIVE
PH: 5 (ref 5.0–8.0)
Protein, ur: NEGATIVE mg/dL
SPECIFIC GRAVITY, URINE: 1.013 (ref 1.005–1.030)

## 2017-06-20 LAB — I-STAT BETA HCG BLOOD, ED (MC, WL, AP ONLY)

## 2017-06-20 LAB — ETHANOL

## 2017-06-20 LAB — I-STAT TROPONIN, ED: Troponin i, poc: 0 ng/mL (ref 0.00–0.08)

## 2017-06-20 MED ORDER — ALPRAZOLAM 1 MG PO TABS
1.0000 mg | ORAL_TABLET | Freq: Four times a day (QID) | ORAL | Status: DC | PRN
  Administered 2017-06-20: 1 mg via ORAL
  Filled 2017-06-20: qty 1

## 2017-06-20 MED ORDER — LISINOPRIL 20 MG PO TABS
20.0000 mg | ORAL_TABLET | Freq: Every day | ORAL | Status: DC
  Administered 2017-06-20: 20 mg via ORAL
  Filled 2017-06-20: qty 1

## 2017-06-20 MED ORDER — STERILE WATER FOR INJECTION IJ SOLN
INTRAMUSCULAR | Status: AC
  Administered 2017-06-20: 1.2 mL
  Filled 2017-06-20: qty 10

## 2017-06-20 MED ORDER — ZOLPIDEM TARTRATE 10 MG PO TABS
10.0000 mg | ORAL_TABLET | Freq: Every evening | ORAL | Status: DC | PRN
  Administered 2017-06-20: 10 mg via ORAL
  Filled 2017-06-20: qty 1

## 2017-06-20 MED ORDER — ZIPRASIDONE MESYLATE 20 MG IM SOLR
10.0000 mg | Freq: Once | INTRAMUSCULAR | Status: AC | PRN
  Administered 2017-06-20: 10 mg via INTRAMUSCULAR
  Filled 2017-06-20: qty 20

## 2017-06-20 MED ORDER — ALPRAZOLAM 1 MG PO TABS
1.0000 mg | ORAL_TABLET | Freq: Once | ORAL | Status: AC
  Administered 2017-06-20: 1 mg via ORAL
  Filled 2017-06-20: qty 1

## 2017-06-20 MED ORDER — MILNACIPRAN HCL 25 MG PO TABS
25.0000 mg | ORAL_TABLET | Freq: Every day | ORAL | Status: DC
  Administered 2017-06-20: 25 mg via ORAL
  Filled 2017-06-20: qty 1

## 2017-06-20 NOTE — ED Notes (Signed)
Pt has continued to yell at staff and run around the unit. Pt attempted to go into other rooms and yelled in staff member's faces. Pt not responsive to redirection
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
## 2017-06-20 NOTE — ED Notes (Signed)
Received report from Stephanie Acre, RN regarding pt plan of care. Pt resting at this time with no distress noted.
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
## 2017-06-20 NOTE — BH Assessment (Signed)
[facility] Assessment Progress Note  Per Juanetta Beets, DO, this pt does not require psychiatric hospitalization at this time.  Pt presents under IVC initiated by EDP Devoria Albe, MD, which Dr Sharma Covert has rescinded.  Pt is to be discharged from [facility] with recommendation to continue treatment with Milagros Evener, MD, her current outpatient provider.  This has been included in pt's discharge instructions.  Pt's nurse, Toni Amend, has been notified.  Doylene Canning, MA Triage Specialist [phone]
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
## 2017-06-20 NOTE — Discharge Instructions (Signed)
For your behavioral health needs, you are advised to continue treatment with Rupinder Kaur, MD: ° °     Rupinder Kaur, MD °     [address] °     [place], [place] [address] °     [phone] °

## 2017-06-20 NOTE — ED Notes (Signed)
Pt stating she demands to leave and if not allowed to leave she will "cut her throat"

## 2017-06-20 NOTE — ED Notes (Signed)
Bed: [facility] Expected date:  Expected time:  Means of arrival:  Comments: 

## 2017-06-20 NOTE — ED Notes (Signed)
Pt continues to yell despite attempts to redirect. Pt has also tried to tear up bed. Provider aware of destructive behaviors. IVC paperwork is being filed.

## 2017-06-20 NOTE — ED Notes (Addendum)
Pt was screaming for more medication to make her sleep. PRN Geodon was given. Pt willingly took injection. Pt repeatedly attempted to take her scrubs off.

## 2017-06-20 NOTE — ED Notes (Signed)
Bed: WA27 Expected date:  Expected time:  Means of arrival:  Comments: 
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
## 2017-06-20 NOTE — ED Notes (Signed)
Pt had c/o central nonradiating CP following getting geodon. Pt had adequate cap refill, S1/S2 heart sounds, clear lung sounds and strong bilateral radial pulses. Pt falling asleep while EKG is being obtained. No distress noted
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
## 2017-06-20 NOTE — ED Notes (Signed)
Belongings moved from #31 to locker #27
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
## 2017-06-22 ENCOUNTER — Emergency Department (HOSPITAL_COMMUNITY)
Admission: EM | Admit: 2017-06-22 | Discharge: 2017-06-23 | Disposition: A | Discharge status: Home / Self Care | Payer: Managed Care, Other (non HMO) | Attending: Physician Assistant | Admitting: Physician Assistant

## 2017-06-22 ENCOUNTER — Encounter (HOSPITAL_COMMUNITY): Payer: Self-pay | Admitting: *Deleted

## 2017-06-22 ENCOUNTER — Other Ambulatory Visit: Payer: Self-pay

## 2017-06-22 DIAGNOSIS — J45909 Unspecified asthma, uncomplicated: Secondary | ICD-10-CM | POA: Diagnosis not present

## 2017-06-22 DIAGNOSIS — Z79899 Other long term (current) drug therapy: Secondary | ICD-10-CM | POA: Insufficient documentation

## 2017-06-22 DIAGNOSIS — Z87891 Personal history of nicotine dependence: Secondary | ICD-10-CM | POA: Insufficient documentation

## 2017-06-22 DIAGNOSIS — Z96611 Presence of right artificial shoulder joint: Secondary | ICD-10-CM | POA: Diagnosis not present

## 2017-06-22 DIAGNOSIS — I10 Essential (primary) hypertension: Secondary | ICD-10-CM | POA: Diagnosis not present

## 2017-06-22 DIAGNOSIS — F329 Major depressive disorder, single episode, unspecified: Secondary | ICD-10-CM | POA: Insufficient documentation

## 2017-06-22 DIAGNOSIS — R45851 Suicidal ideations: Secondary | ICD-10-CM | POA: Diagnosis not present

## 2017-06-22 LAB — COMPREHENSIVE METABOLIC PANEL
ALK PHOS: 125 U/L (ref 38–126)
ALT: 25 U/L (ref 14–54)
AST: 34 U/L (ref 15–41)
Albumin: 4.3 g/dL (ref 3.5–5.0)
Anion gap: 11 (ref 5–15)
BUN: 10 mg/dL (ref 6–20)
CALCIUM: 9.7 mg/dL (ref 8.9–10.3)
CO2: 23 mmol/L (ref 22–32)
CREATININE: 0.79 mg/dL (ref 0.44–1.00)
Chloride: 105 mmol/L (ref 101–111)
GFR calc Af Amer: >60 mL/min (ref >60)
GFR calc non Af Amer: >60 mL/min (ref >60)
GLUCOSE: 132 mg/dL — AB (ref 65–99)
Potassium: 3.1 mmol/L — ABNORMAL LOW (ref 3.5–5.1)
SODIUM: 139 mmol/L (ref 135–145)
Total Bilirubin: 0.5 mg/dL (ref 0.3–1.2)
Total Protein: 7.6 g/dL (ref 6.5–8.1)

## 2017-06-22 LAB — CBC
HEMATOCRIT: 41.9 % (ref 36.0–46.0)
HEMOGLOBIN: 13.6 g/dL (ref 12.0–15.0)
MCH: 26.6 pg (ref 26.0–34.0)
MCHC: 32.5 g/dL (ref 30.0–36.0)
MCV: 81.8 fL (ref 78.0–100.0)
Platelets: 450 10*3/uL — ABNORMAL HIGH (ref 150–400)
RBC: 5.12 MIL/uL — ABNORMAL HIGH (ref 3.87–5.11)
RDW: 15.9 % — ABNORMAL HIGH (ref 11.5–15.5)
WBC: 10.7 10*3/uL — ABNORMAL HIGH (ref 4.0–10.5)

## 2017-06-22 LAB — I-STAT BETA HCG BLOOD, ED (MC, WL, AP ONLY): I-stat hCG, quantitative: <5 m[IU]/mL (ref <5)

## 2017-06-22 LAB — ACETAMINOPHEN LEVEL: Acetaminophen (Tylenol), Serum: <10 ug/mL — ABNORMAL LOW (ref 10–30)

## 2017-06-22 LAB — SALICYLATE LEVEL: Salicylate Lvl: <7 mg/dL (ref 2.8–30.0)

## 2017-06-22 LAB — ETHANOL: Alcohol, Ethyl (B): 73 mg/dL — ABNORMAL HIGH (ref <10)

## 2017-06-22 MED ORDER — ONDANSETRON HCL 4 MG PO TABS: 4.0000 mg | ORAL_TABLET | Freq: Three times a day (TID) | ORAL | Status: DC | PRN

## 2017-06-22 MED ORDER — HALOPERIDOL LACTATE 5 MG/ML IJ SOLN
5.0000 mg | Freq: Once | INTRAMUSCULAR | Status: AC
  Administered 2017-06-22: 5 mg via INTRAMUSCULAR
  Filled 2017-06-22: qty 1

## 2017-06-22 MED ORDER — DIPHENHYDRAMINE HCL 50 MG/ML IJ SOLN: 50.0000 mg | Freq: Once | INTRAMUSCULAR | Status: DC

## 2017-06-22 MED ORDER — ACETAMINOPHEN 325 MG PO TABS
650.0000 mg | ORAL_TABLET | ORAL | Status: DC | PRN
  Administered 2017-06-23 (×2): 650 mg via ORAL
  Filled 2017-06-22 (×2): qty 2
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
## 2017-06-22 NOTE — BH Assessment (Signed)
Counselor called to attempt TTS consult, pt is sleeping, TTS to try again in about 2 hours as recommended by RN.

## 2017-06-22 NOTE — ED Notes (Signed)
[place], Md is aware the pt is being transfered to POD F and plans to assess the pt
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
## 2017-06-22 NOTE — ED Notes (Signed)
[facility] called to do pts TTS. TTS machine not in our ED. Called peds, and they have computer and will bring it here.
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
## 2017-06-22 NOTE — ED Provider Notes (Signed)
Patient placed in Quick Look pathway, seen and evaluated   Chief Complaint: S/I  HPI:   Kara [name] is a 62 y.o. female with hx of PTSD S/I and multiple other health problems who presents to the ED stating her husband cheated on her and she just wants to go to sleep. Patient's neighbor brought her to the ED stating that for the past 3 day the patient has not eaten and has been extremely angry and hyper. Patient crying and screaming that she can not live with this. Patient reports she has stopped her xanax, ambien and Adderall. She did take one of her Effexor yesterday.   ROS: Psych: angry, S/I  Physical Exam:  BP (!) 124/94 (BP Location: Left Arm)   Pulse 96   Temp 98 F (36.7 C) (Oral)   Resp 16   SpO2 97%    Gen: No distress  Neuro: Awake and Alert  Skin: Warm and dry  Psych: angry, crying, S/I  Focused Exam:   Initiation of care has begun. The patient has been counseled on the process, plan, and necessity for staying for the completion/evaluation, and the remainder of the medical screening examination    Janne Napoleon, NP 06/22/17 1507    Abelino Derrick, MD 06/22/17 2011
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
## 2017-06-22 NOTE — ED Notes (Addendum)
TTS machine at bedside. Pt still continues to yell and scream. Ask pt to calm down. Pt refuses.

## 2017-06-22 NOTE — ED Notes (Addendum)
Pt quiet. Appears to be asleep. Will continue to monitor.
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
## 2017-06-22 NOTE — ED Notes (Addendum)
Called behavioral health to let them know pt is awake and ready for TTS. MD aware that pt is awake and acting out-yelling and screaming, hitting and kicking the bed, crying. Ask pt to please calm down. Pt states "I just want to die" "Just make me go to sleep". Sitter at bedside. MD to come see pt. Pt hitting her head against the wall, kicking the wall. Ask pt to stop harming herself.

## 2017-06-22 NOTE — ED Provider Notes (Signed)
[facility] EMERGENCY DEPARTMENT Provider Note   CSN: [id number] Arrival date & time: 06/22/17  1415     History   Chief Complaint Chief Complaint  Patient presents with  . Suicidal    HPI Kara [name] is a 62 y.o. female.  HPI   Patient is a 62 year old female presenting in hysterics.  Patient yelling, making a fuss.  Requiring security guards.  Patient was given 5 IM Haldol.  She continued to make a large scene and had to be brought back to F pod.  She was screaming "I am going to hurt myself, I want to leave this earth".  Patient reports that she found her husband was cheating on her and that she "freaked out".  Past Medical History:  Diagnosis Date  . ADD (attention deficit disorder)    on Adderal  . Aggressive behavior of adult   . Anemia   . Anxiety   . Cellulitis of breast 11/2013   RIGHT BREAST  . Childhood asthma   . 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 Chronic lower back pain   . Chronic pain    went to [facility] for pain control; stopped in 2016 " (02/23/2017)  . Cold sore   . 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 Degenerative disc disease, lumbar   . Depression    takes meds daily  . Family history of malignant neoplasm of breast   . Fibromyalgia   . History of kidney stones   . Hypertension   . Osteoporosis   . Other specified rheumatoid arthritis, right shoulder (HCC) 08/01/2011  . Pneumonia 2010?  [name] Post-nasal drip    hx of  . Pre-diabetes   . RA (rheumatoid arthritis) (HCC)    autoimmune arthritis    Patient Active Problem List   Diagnosis Date Noted  . Decreased libido 03/29/2017  . Lumbar stenosis 02/23/2017  . Spinal stenosis of lumbar region with neurogenic claudication 02/19/2017  . S/P lumbar spinal fusion 01/01/2017  . Essential hypertension 07/26/2016  . DNR (do not resuscitate) discussion   . Palliative care by specialist   . Cervical  pseudoarthrosis (HCC) 06/21/2016  . Opioid dependence in remission (HCC) 06/14/2016  . Mitral valve prolapse 01/04/2016  . HNP (herniated nucleus pulposus), lumbar 12/10/2015  . Prediabetes 08/19/2015  . Overweight (BMI 25.0-29.9) 08/18/2015  . Chronic pain 08/18/2015  . Vitamin D deficiency 08/18/2015  . Chronic fatigue disorder 06/14/2015  . S/P cervical spinal fusion 03/15/2015  . PTSD (post-traumatic stress disorder) 03/07/2014  . Severe recurrent major depression without psychotic features (HCC) 03/06/2014  . Suicide threat or attempt 03/06/2014  . Aggressive behavior   . Family history of malignant neoplasm of breast   . Rheumatoid arthritis (HCC) 12/13/2011  . Fibromyalgia 12/13/2011  . H/O cold sores 12/13/2011    Past Surgical History:  Procedure Laterality Date  . ANTERIOR CERVICAL DECOMP/DISCECTOMY FUSION N/A 03/15/2015   Procedure: Cervical five-six, Cerival six-seven, Anterior Cervical Discectomy and Fusion, Allograft and Plate;  Surgeon: Eldred Manges, MD;  Location: [facility] OR;  Service: Orthopedics;  Laterality: N/A;  . AUGMENTATION MAMMAPLASTY  2003  . BACK SURGERY    . BLADDER SUSPENSION  2009  . BREAST IMPLANT REMOVAL Bilateral 10/2013  . CERVICAL WOUND DEBRIDEMENT N/A 07/07/2016   Procedure: IRRIGATION AND DEBRIDEMENT POSTERIOR NECK;  Surgeon: Eldred Manges, MD;  Location: [facility] OR;  Service: Orthopedics;  Laterality: N/A;  .  COLONOSCOPY    . COMBINED ABDOMINOPLASTY AND LIPOSUCTION  2003  . INCISION AND DRAINAGE ABSCESS Right 01/16/2014   Procedure: INCISION AND DRAINAGE AND OF RIGHT BREAST ABCESS;  Surgeon: Glenna Fellows, MD;  Location: [facility] ORS;  Service: General;  Laterality: Right;  . JOINT REPLACEMENT    . LUMBAR LAMINECTOMY/DECOMPRESSION MICRODISCECTOMY N/A 12/10/2015   Procedure: Right L3-4 Hemilaminectomy, Excision of herniated nucleus pulposus;  Surgeon: Eldred Manges, MD;  Location: [facility] OR;  Service: Orthopedics;  Laterality: N/A;  . MASS EXCISION  11/03/2011    Procedure: MINOR EXCISION OF MASS;  Surgeon: Wyn Forster., MD;  Location: [facility];  Service: Orthopedics;  Laterality: Left;  debride IP joint, cyst excision left index  . MAXIMUM ACCESS (MAS) TRANSFORAMINAL LUMBAR INTERBODY FUSION (TLIF) 2 LEVEL Right 02/23/2017  . POSTERIOR CERVICAL FUSION/FORAMINOTOMY N/A 06/21/2016   Procedure: POSTERIOR CERVICAL FUSION C5-C7 SPINOUS PROCESS WIRING;  Surgeon: Eldred Manges, MD;  Location: [facility] OR;  Service: Orthopedics;  Laterality: N/A;  . POSTERIOR LUMBAR FUSION  07/2009; 07/03/2014   "L4-5; L5-S1"  . SHOULDER ARTHROSCOPY Right 2012  . TONSILLECTOMY  1987  . TOTAL SHOULDER ARTHROPLASTY  08/01/2011   Procedure: TOTAL SHOULDER ARTHROPLASTY;  Surgeon: Eulas Post, MD;  Location: [facility] OR;  Service: Orthopedics;  Laterality: Right;  Right total shoulder arthroplasty  . TUBAL LIGATION  1988     OB History   None      Home Medications    Prior to Admission medications   Medication Sig Start Date End Date Taking? Authorizing Provider  acetaminophen (TYLENOL) 500 MG tablet Take 500 mg by mouth every 8 (eight) hours as needed for mild pain or moderate pain.    [provider]  ADDERALL XR 30 MG 24 hr capsule Take 60 mg by mouth daily.  11/02/15   [provider]  alendronate (FOSAMAX) 35 MG tablet Take 1 tablet (35 mg total) by mouth every 7 (seven) days. Take with a full glass of water on an empty stomach. 05/02/17   Kuneff, Renee A, DO  ALPRAZolam (XANAX) 1 MG tablet Take 1 mg by mouth 4 (four) times daily as needed for anxiety (takes for pain which causes anxiety).  07/06/14   [provider]  cephALEXin (KEFLEX) 500 MG capsule Take 1 capsule (500 mg total) by mouth 2 (two) times daily. 06/14/17   Waldon Merl, PA-C  cholecalciferol (VITAMIN D) 1000 units tablet Take 1,000 Units by mouth daily.    [provider]  lisinopril (PRINIVIL,ZESTRIL) 10 MG tablet Take 1.5 tablets (15 mg total) by mouth  daily. Patient taking differently: Take 20 mg by mouth daily.  04/17/17   Kuneff, Renee A, DO  meloxicam (MOBIC) 15 MG tablet TAKE 1 TABLET BY MOUTH EVERY DAY 05/08/17   Kerrin Champagne, MD  pyridoxine (B-6) 100 MG tablet Take 100 mg by mouth 2 (two) times daily.    [provider]  SAVELLA 25 MG TABS Take 25 mg by mouth daily.  12/19/16   [provider]  tretinoin (RETIN-A) 0.1 % cream Apply topically at bedtime. 04/17/17   Kuneff, Renee A, DO  venlafaxine XR (EFFEXOR-XR) 75 MG 24 hr capsule Take 225 mg by mouth daily with breakfast. Takes 3 tablets    [provider]  zolpidem (AMBIEN) 10 MG tablet TAKE 1 TABLET (10 mg) BY MOUTH AT BEDTIME 05/23/15   [provider]    Family History Family History  Problem Relation Age of  Onset  . Arthritis Mother   . Heart disease Mother        ?psvt  . Breast cancer Mother 52       TAH/BSO  . Cancer Mother   . Mental illness Mother   . COPD Father   . Hypertension Father   . Alcohol abuse Father   . Mental illness Father   . Heart disease Father   . Healthy Daughter   . Breast cancer Maternal Aunt 27       deceased  . Cancer Cousin 64       female; unknown primary  . Colon cancer Paternal Aunt 65       deceased at 86  . Stomach cancer Paternal Uncle 52       deceased at 77  . Alcohol abuse Brother   . Cancer Brother   . Hodgkin's lymphoma Brother   . Mental illness Brother   . HIV Brother   . Healthy Brother   . Healthy Son     Social History Social History   Tobacco Use  . Smoking status: Former Smoker    Packs/day: 1.00    Years: 10.00    Pack years: 10.00    Types: Cigarettes    Last attempt to quit: 12/06/2005    Years since quitting: 11.5  . Smokeless tobacco: Never Used  Substance Use Topics  . Alcohol use: Yes    Comment: 02/23/2017 "might have 1-2 drinks/month"  . Drug use: Yes    Types: Marijuana    Comment: "i smoke marijuana to avoid opiates"      Allergies   Aspirin;  Ibuprofen; Ativan [lorazepam]; Ketamine; Toradol [ketorolac tromethamine]; and Tramadol hcl   Review of Systems Review of Systems  Unable to perform ROS: Psychiatric disorder     Physical Exam Updated Vital Signs BP 116/78 (BP Location: Right Arm)   Pulse 89   Temp 97.6 F (36.4 C) (Oral)   Resp 20   SpO2 99%   Physical Exam  Constitutional: She is oriented to person, place, and time. She appears well-developed and well-nourished.  HENT:  Head: Normocephalic and atraumatic.  Eyes: Right eye exhibits no discharge.  Cardiovascular: Normal rate and regular rhythm.  Pulmonary/Chest: Effort normal and breath sounds normal. No respiratory distress.  Neurological: She is oriented to person, place, and time.  Skin: Skin is warm and dry. She is not diaphoretic.  Psychiatric:  Currently denying HI SI.  Nursing note and vitals reviewed.    ED Treatments / Results  Labs (all labs ordered are listed, but only abnormal results are displayed) Labs Reviewed  COMPREHENSIVE METABOLIC PANEL - Abnormal; Notable for the following components:      Result Value   Potassium 3.1 (*)    Glucose, Bld 132 (*)    All other components within normal limits  ETHANOL - Abnormal; Notable for the following components:   Alcohol, Ethyl (B) 73 (*)    All other components within normal limits  ACETAMINOPHEN LEVEL - Abnormal; Notable for the following components:   Acetaminophen (Tylenol), Serum <10 (*)    All other components within normal limits  CBC - Abnormal; Notable for the following components:   WBC 10.7 (*)    RBC 5.12 (*)    RDW 15.9 (*)    Platelets 450 (*)    All other components within normal limits  SALICYLATE LEVEL  RAPID URINE DRUG SCREEN, HOSP PERFORMED  I-STAT BETA HCG BLOOD, ED ([facility], [facility], [facility] ONLY)  I-STAT BETA HCG BLOOD, ED ([facility], [facility], [facility] ONLY)    EKG None  Radiology No results found.  Procedures Procedures (including critical care time)  Medications Ordered in  ED Medications  diphenhydrAMINE (BENADRYL) injection 50 mg (has no administration in time range)  acetaminophen (TYLENOL) tablet 650 mg (has no administration in time range)  ondansetron (ZOFRAN) tablet 4 mg (has no administration in time range)  haloperidol lactate (HALDOL) injection 5 mg (5 mg Intramuscular Given 06/22/17 1546)     Initial Impression / Assessment and Plan / ED Course  I have reviewed the triage vital signs and the nursing notes.  Pertinent labs & imaging results that were available during my care of the patient were reviewed by me and considered in my medical decision making (see chart for details).     Patient is a 62 year old female presenting in hysterics.  Patient yelling, making a fuss.  Requiring security guards.  Patient was given 5 IM Haldol.  She continued to make a large scene and had to be brought back to F pod.  She was screaming "I am going to hurt myself, I want to leave this earth".  Patient reports that she found her husband was cheating on her and that she "freaked out".  6:27 PM Patient slept after the Haldol and is now awake and cooperative.  She currently denies SI HI.  However given the magnitude of the original outburst, will consult with TTS.  Patient has a psychiatrist as an outpatient.  Final Clinical Impressions(s) / ED Diagnoses   Final diagnoses:  None    ED Discharge Orders    None       Abelino Derrick, MD 06/22/17 1831

## 2017-06-22 NOTE — ED Notes (Signed)
Spoke with [facility], and they are recommending inpatient treatment.

## 2017-06-22 NOTE — ED Notes (Signed)
Unable to obtain vitals at this time, d/t pt anxiety & pacing

## 2017-06-23 ENCOUNTER — Other Ambulatory Visit: Payer: Self-pay

## 2017-06-23 LAB — RAPID URINE DRUG SCREEN, HOSP PERFORMED
Amphetamines: POSITIVE — AB
Barbiturates: NOT DETECTED
Benzodiazepines: POSITIVE — AB
Cocaine: NOT DETECTED
OPIATES: POSITIVE — AB
Tetrahydrocannabinol: POSITIVE — AB

## 2017-06-23 MED ORDER — ALPRAZOLAM 0.5 MG PO TABS
1.0000 mg | ORAL_TABLET | Freq: Four times a day (QID) | ORAL | Status: DC | PRN
  Administered 2017-06-23 (×2): 1 mg via ORAL
  Filled 2017-06-23 (×2): qty 2

## 2017-06-23 MED ORDER — LISINOPRIL 2.5 MG PO TABS
15.0000 mg | ORAL_TABLET | Freq: Every day | ORAL | Status: DC
  Administered 2017-06-23: 15 mg via ORAL
  Filled 2017-06-23: qty 1

## 2017-06-23 MED ORDER — AMPHETAMINE-DEXTROAMPHET ER 10 MG PO CP24
60.0000 mg | ORAL_CAPSULE | Freq: Every day | ORAL | Status: DC
  Administered 2017-06-23: 60 mg via ORAL
  Filled 2017-06-23: qty 6

## 2017-06-23 MED ORDER — AMPHETAMINE-DEXTROAMPHET ER 20 MG PO CP24
60.0000 mg | ORAL_CAPSULE | Freq: Every day | ORAL | Status: DC
  Filled 2017-06-23: qty 3

## 2017-06-23 MED ORDER — HALOPERIDOL 5 MG PO TABS
5.0000 mg | ORAL_TABLET | Freq: Once | ORAL | Status: AC
Start: 2017-06-23 — End: 2017-06-23
  Administered 2017-06-23: 5 mg via ORAL
  Filled 2017-06-23: qty 1

## 2017-06-23 MED ORDER — CEPHALEXIN 250 MG PO CAPS
500.0000 mg | ORAL_CAPSULE | Freq: Two times a day (BID) | ORAL | Status: DC
  Administered 2017-06-23: 500 mg via ORAL
  Filled 2017-06-23: qty 2

## 2017-06-23 MED ORDER — AMPHETAMINE-DEXTROAMPHET ER 30 MG PO CP24: 60.0000 mg | ORAL_CAPSULE | Freq: Every day | ORAL | Status: DC

## 2017-06-23 MED ORDER — ZOLPIDEM TARTRATE 5 MG PO TABS: 10.0000 mg | ORAL_TABLET | Freq: Every day | ORAL | Status: DC

## 2017-06-23 MED ORDER — TRETINOIN 0.1 % EX CREA: TOPICAL_CREAM | Freq: Every day | CUTANEOUS | Status: DC

## 2017-06-23 MED ORDER — ALENDRONATE SODIUM 35 MG PO TABS: 35.0000 mg | ORAL_TABLET | ORAL | Status: DC

## 2017-06-23 MED ORDER — POTASSIUM CHLORIDE CRYS ER 20 MEQ PO TBCR
40.0000 meq | EXTENDED_RELEASE_TABLET | Freq: Once | ORAL | Status: AC
Start: 2017-06-23 — End: 2017-06-23
  Administered 2017-06-23: 40 meq via ORAL
  Filled 2017-06-23: qty 2

## 2017-06-23 MED ORDER — VENLAFAXINE HCL ER 150 MG PO CP24
150.0000 mg | ORAL_CAPSULE | Freq: Every day | ORAL | Status: DC
  Administered 2017-06-23: 150 mg via ORAL
  Filled 2017-06-23: qty 1

## 2017-06-23 MED ORDER — VITAMIN B-6 100 MG PO TABS
100.0000 mg | ORAL_TABLET | Freq: Two times a day (BID) | ORAL | Status: DC
  Administered 2017-06-23: 100 mg via ORAL
  Filled 2017-06-23: qty 1

## 2017-06-23 MED ORDER — VITAMIN D 1000 UNITS PO TABS
1000.0000 [IU] | ORAL_TABLET | Freq: Every day | ORAL | Status: DC
  Administered 2017-06-23: 1000 [IU] via ORAL
  Filled 2017-06-23: qty 1

## 2017-06-23 MED ORDER — MILNACIPRAN HCL 25 MG PO TABS
25.0000 mg | ORAL_TABLET | Freq: Every day | ORAL | Status: DC
  Administered 2017-06-23: 25 mg via ORAL
  Filled 2017-06-23: qty 1

## 2017-06-23 MED ORDER — ACETAMINOPHEN ER 650 MG PO TBCR: 650.0000 mg | EXTENDED_RELEASE_TABLET | Freq: Three times a day (TID) | ORAL | Status: DC | PRN

## 2017-06-23 NOTE — ED Notes (Signed)
Xanax given as requested. States "that's what is keeping me calm".

## 2017-06-23 NOTE — ED Notes (Signed)
Pt states she forgot to give urine specimen when she went to bathroom. States she feels so much better since she slept. Pt aware re-TTS will be performed soon now that she is awake.

## 2017-06-23 NOTE — ED Notes (Signed)
TTS completed. 

## 2017-06-23 NOTE — ED Notes (Signed)
Ice pack given to pt for c/o left breast pain.

## 2017-06-23 NOTE — ED Notes (Signed)
Pt noted w/red colored eyeglasses on counter in room.

## 2017-06-23 NOTE — ED Notes (Addendum)
Pt noted to be yelling - ambulatory to nurses' desk asking for med for left breast pain. Tylenol given. Pt returned to room as asked by staff. Pt noted to be yelling at [facility], threw her pillow on floor, and hitting bed w/her hand. RN allowed pt to vent feelings/concerns and encouraged pt to keep voice low and not to be hitting. Pt stating her husband is cheating on her and this makes her upset. Pt asking if she may leave - advised pt if her behaviors improve and [facility] recommends such, this may be done. Voiced understanding. Pt asked if she may make a phone call - advised pt may do so only if she acts appropriately. Pt voiced understanding - called her husband and asked for him to come visit at 0830. Pt returned to her room and is lying on bed singing. Voices understanding of need for urine specimen.

## 2017-06-23 NOTE — BH Assessment (Signed)
Re-assessment note: Pt requests to be d/c'ed from hospital. She presents sitting cross-legged, dressed in scrubs. Pt is cooperative & reports feeling much better today. Pt continues to deny SI, HI, AVH and sx of psychosis. Pt thinks missing some of her meds may have contributed to her outburst yesterday. Pt states she and her spouse of 20 yrs intend to go to marriage counseling after d/c and she will also continue to see Dr. Toy Care & resume therapy with Ria Comment. Per notes, spouse is supportive of pt returning home at this time. Zerita Boers, NP, also met with pt & confirmed no SI, HI or AVH & advised pt to ask her providers for help if in crisis again. Pt agreed, stated she asked for help this time and would again.    Disposition: Cecile Sheerer, NP, recommends Outpatient tx.

## 2017-06-23 NOTE — ED Notes (Addendum)
Spouse advised RN, prior to him leaving, that he plans to return to visit at 1230. States he is aware pt has had multiple ED visits in past 2 months. States he feels comfortable for her to return home d/t waiting for placement in ED seems to make her worse. States he had an affair years ago and pt "will not let it go". States he has tried to convince her he has remained loyal to her since. States she has not been following up w/outpt and when she does, she knows what to tell the therapist so she does not seem to improve. Aware pt given Haldol - states "that's good. That helps her to rest".

## 2017-06-23 NOTE — ED Notes (Signed)
Pt aware of tx plan - d/c to home. Pt called spouse and notified him.

## 2017-06-23 NOTE — ED Provider Notes (Signed)
PTB ALERT, GCS 15. NO DISTRESS. sTABLE . ONE DOSE PO HALDOL ORDERED AS WAS KICKING WALLS YESTERDAY, .COOPERATIVE NOW .   Doug Sou, MD 06/23/17 [phone]

## 2017-06-23 NOTE — ED Notes (Signed)
Lying on bed, alert.

## 2017-06-23 NOTE — ED Notes (Signed)
Pt given ice pack as requested. States Tylenol and ice pack relieved pain earlier.

## 2017-06-23 NOTE — ED Notes (Signed)
Pt's spouse left approx 5 min ago. Aware waiting for TTS to be performed when pt awakens. Pt now awake - eating lunch.

## 2017-06-23 NOTE — ED Notes (Signed)
Breakfast tray ordered 

## 2017-06-23 NOTE — ED Notes (Addendum)
Spouse visiting w/pt - pt asking RN x 2 if she may leave d/t states feels better and her daughter is in labor and pt stating to spouse - "I promise I won't get angry anymore". Pt given meds as ordered d/t pt anxious. Voices understanding of need for urine specimen.

## 2017-06-23 NOTE — ED Notes (Signed)
Pt will yell out occasionally " I want to leave this place" "Hello, where is the doctor?". Pt then falls back asleep.

## 2017-06-23 NOTE — ED Notes (Addendum)
Pt begins to yell out again. "I'm tired of being here" "I want to go home" "I want to see the doctor".

## 2017-06-23 NOTE — ED Notes (Signed)
Pt calm and states that she slept well last night. Asking when she will be discharged. Pt made aware of process here in psych, and that at this time, are unsure of her disposition. Pt states "I'm ready to go home, can't I just go?".

## 2017-06-23 NOTE — ED Notes (Signed)
Pt aware that we need a urine sample. "Fuck you, I ain't giving you a damn thing".

## 2017-06-23 NOTE — ED Notes (Signed)
Spouse has arrived to visit w/pt. Aware pt is sleeping - states he will not wake her - just wants to be here in case she wakes up. Aware may stay until 1300.

## 2017-06-23 NOTE — ED Notes (Signed)
Spouse present and is in agreement w/tx plan. Outpt resources discussed and given to pt w/discharge paperwork. Pt states she and her husband are planning to go to Marriage Counseling.

## 2017-06-24 ENCOUNTER — Emergency Department (HOSPITAL_COMMUNITY): Payer: Managed Care, Other (non HMO)

## 2017-06-24 ENCOUNTER — Emergency Department (HOSPITAL_COMMUNITY)
Admission: EM | Admit: 2017-06-24 | Discharge: 2017-06-24 | Disposition: A | Discharge status: Home / Self Care | Payer: Managed Care, Other (non HMO) | Attending: Emergency Medicine | Admitting: Emergency Medicine

## 2017-06-24 ENCOUNTER — Encounter (HOSPITAL_COMMUNITY): Payer: Self-pay

## 2017-06-24 DIAGNOSIS — S2232XA Fracture of one rib, left side, initial encounter for closed fracture: Secondary | ICD-10-CM | POA: Insufficient documentation

## 2017-06-24 DIAGNOSIS — W010XXA Fall on same level from slipping, tripping and stumbling without subsequent striking against object, initial encounter: Secondary | ICD-10-CM | POA: Insufficient documentation

## 2017-06-24 DIAGNOSIS — J45909 Unspecified asthma, uncomplicated: Secondary | ICD-10-CM | POA: Insufficient documentation

## 2017-06-24 DIAGNOSIS — Z79899 Other long term (current) drug therapy: Secondary | ICD-10-CM | POA: Diagnosis not present

## 2017-06-24 DIAGNOSIS — Y929 Unspecified place or not applicable: Secondary | ICD-10-CM | POA: Diagnosis not present

## 2017-06-24 DIAGNOSIS — Y939 Activity, unspecified: Secondary | ICD-10-CM | POA: Diagnosis not present

## 2017-06-24 DIAGNOSIS — Z87891 Personal history of nicotine dependence: Secondary | ICD-10-CM | POA: Insufficient documentation

## 2017-06-24 DIAGNOSIS — Y999 Unspecified external cause status: Secondary | ICD-10-CM | POA: Diagnosis not present

## 2017-06-24 DIAGNOSIS — S299XXA Unspecified injury of thorax, initial encounter: Secondary | ICD-10-CM | POA: Diagnosis present

## 2017-06-24 DIAGNOSIS — I10 Essential (primary) hypertension: Secondary | ICD-10-CM | POA: Diagnosis not present

## 2017-06-24 MED ORDER — HYDROCODONE-ACETAMINOPHEN 5-325 MG PO TABS: 1.0000 | ORAL_TABLET | Freq: Four times a day (QID) | ORAL | 0 refills | Status: DC | PRN

## 2017-06-24 NOTE — ED Triage Notes (Signed)
Patient complains of left rib pain after fall 5 days ago. Denies loc. Pain with any movement and inspiration, NAD

## 2017-06-24 NOTE — ED Provider Notes (Addendum)
[facility] EMERGENCY DEPARTMENT Provider Note   CSN: [id number] Arrival date & time: 06/24/17  8938     History   Chief Complaint Chief Complaint  Patient presents with  . fall/left rib pain    HPI Kara [name] is a 62 y.o. female.  HPI patient tripped and fell 4 or 5 days ago injuring her left ribs.  No other injury she has pain at left lateral ribs when she takes a deep breath pain lasts for a few seconds.  She denies shortness of breath no other injury.  Treated with Tylenol.  Without relief.  Pain worse with deep inspiration or with changing positions improved with remaining still.  Past Medical History:  Diagnosis Date  . ADD (attention deficit disorder)    on Adderal  . Aggressive behavior of adult   . Anemia   . Anxiety   . Cellulitis of breast 11/2013   RIGHT BREAST  . Childhood asthma   . 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 Chronic lower back pain   . Chronic pain    went to [facility] for pain control; stopped in 2016 " (02/23/2017)  . Cold sore   . 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 Degenerative disc disease, lumbar   . Depression    takes meds daily  . Family history of malignant neoplasm of breast   . Fibromyalgia   . History of kidney stones   . Hypertension   . Osteoporosis   . Other specified rheumatoid arthritis, right shoulder (HCC) 08/01/2011  . Pneumonia 2010?  [name] Post-nasal drip    hx of  . Pre-diabetes   . RA (rheumatoid arthritis) (HCC)    autoimmune arthritis    Patient Active Problem List   Diagnosis Date Noted  . Decreased libido 03/29/2017  . Lumbar stenosis 02/23/2017  . Spinal stenosis of lumbar region with neurogenic claudication 02/19/2017  . S/P lumbar spinal fusion 01/01/2017  . Essential hypertension 07/26/2016  . DNR (do not resuscitate) discussion   . Palliative care by specialist   . Cervical pseudoarthrosis (HCC) 06/21/2016    . Opioid dependence in remission (HCC) 06/14/2016  . Mitral valve prolapse 01/04/2016  . HNP (herniated nucleus pulposus), lumbar 12/10/2015  . Prediabetes 08/19/2015  . Overweight (BMI 25.0-29.9) 08/18/2015  . Chronic pain 08/18/2015  . Vitamin D deficiency 08/18/2015  . Chronic fatigue disorder 06/14/2015  . S/P cervical spinal fusion 03/15/2015  . PTSD (post-traumatic stress disorder) 03/07/2014  . Severe recurrent major depression without psychotic features (HCC) 03/06/2014  . Suicide threat or attempt 03/06/2014  . Aggressive behavior   . Family history of malignant neoplasm of breast   . Rheumatoid arthritis (HCC) 12/13/2011  . Fibromyalgia 12/13/2011  . H/O cold sores 12/13/2011    Past Surgical History:  Procedure Laterality Date  . ANTERIOR CERVICAL DECOMP/DISCECTOMY FUSION N/A 03/15/2015   Procedure: Cervical five-six, Cerival six-seven, Anterior Cervical Discectomy and Fusion, Allograft and Plate;  Surgeon: Eldred Manges, MD;  Location: [facility] OR;  Service: Orthopedics;  Laterality: N/A;  . AUGMENTATION MAMMAPLASTY  2003  . BACK SURGERY    . BLADDER SUSPENSION  2009  . BREAST IMPLANT REMOVAL Bilateral 10/2013  . CERVICAL WOUND DEBRIDEMENT N/A 07/07/2016   Procedure: IRRIGATION AND DEBRIDEMENT POSTERIOR NECK;  Surgeon: Eldred Manges, MD;  Location: [facility] OR;  Service: Orthopedics;  Laterality: N/A;  . COLONOSCOPY    . COMBINED  ABDOMINOPLASTY AND LIPOSUCTION  2003  . INCISION AND DRAINAGE ABSCESS Right 01/16/2014   Procedure: INCISION AND DRAINAGE AND OF RIGHT BREAST ABCESS;  Surgeon: Glenna Fellows, MD;  Location: [facility] ORS;  Service: General;  Laterality: Right;  . JOINT REPLACEMENT    . LUMBAR LAMINECTOMY/DECOMPRESSION MICRODISCECTOMY N/A 12/10/2015   Procedure: Right L3-4 Hemilaminectomy, Excision of herniated nucleus pulposus;  Surgeon: Eldred Manges, MD;  Location: [facility] OR;  Service: Orthopedics;  Laterality: N/A;  . MASS EXCISION  11/03/2011   Procedure: MINOR EXCISION OF MASS;   Surgeon: Wyn Forster., MD;  Location: [facility];  Service: Orthopedics;  Laterality: Left;  debride IP joint, cyst excision left index  . MAXIMUM ACCESS (MAS) TRANSFORAMINAL LUMBAR INTERBODY FUSION (TLIF) 2 LEVEL Right 02/23/2017  . POSTERIOR CERVICAL FUSION/FORAMINOTOMY N/A 06/21/2016   Procedure: POSTERIOR CERVICAL FUSION C5-C7 SPINOUS PROCESS WIRING;  Surgeon: Eldred Manges, MD;  Location: [facility] OR;  Service: Orthopedics;  Laterality: N/A;  . POSTERIOR LUMBAR FUSION  07/2009; 07/03/2014   "L4-5; L5-S1"  . SHOULDER ARTHROSCOPY Right 2012  . TONSILLECTOMY  1987  . TOTAL SHOULDER ARTHROPLASTY  08/01/2011   Procedure: TOTAL SHOULDER ARTHROPLASTY;  Surgeon: Eulas Post, MD;  Location: [facility] OR;  Service: Orthopedics;  Laterality: Right;  Right total shoulder arthroplasty  . TUBAL LIGATION  1988     OB History   None      Home Medications    Prior to Admission medications   Medication Sig Start Date End Date Taking? Authorizing Provider  acetaminophen (TYLENOL) 650 MG CR tablet Take 650 mg by mouth every 8 (eight) hours as needed for pain.    [provider]  alendronate (FOSAMAX) 35 MG tablet Take 1 tablet (35 mg total) by mouth every 7 (seven) days. Take with a full glass of water on an empty stomach. 05/02/17   Kuneff, Renee A, DO  ALPRAZolam (XANAX) 1 MG tablet Take 1 mg by mouth 4 (four) times daily as needed for anxiety (takes for pain which causes anxiety).  07/06/14   [provider]  amphetamine-dextroamphetamine (ADDERALL XR) 30 MG 24 hr capsule Take 60 mg by mouth daily.    [provider]  amphetamine-dextroamphetamine (ADDERALL) 30 MG tablet Take 30 mg by mouth every evening.  05/26/17   [provider]  cholecalciferol (VITAMIN D) 1000 units tablet Take 1,000 Units by mouth daily.    [provider]  hydroxychloroquine (PLAQUENIL) 200 MG tablet Take 200 mg by mouth 2 (two) times daily. 05/22/17   [provider]   lisinopril (PRINIVIL,ZESTRIL) 10 MG tablet Take 1.5 tablets (15 mg total) by mouth daily. 04/17/17   Kuneff, Renee A, DO  MAGNESIUM PO Take 1 tablet by mouth daily.    [provider]  meloxicam (MOBIC) 15 MG tablet TAKE 1 TABLET BY MOUTH EVERY DAY Patient taking differently: TAKE 1 TABLET (15 MG) BY MOUTH EVERY DAY 05/08/17   Kerrin Champagne, MD  Milnacipran HCl (SAVELLA) 25 MG TABS Take 25 mg by mouth daily.    [provider]  pyridoxine (B-6) 100 MG tablet Take 100 mg by mouth 2 (two) times daily.    [provider]  tretinoin (RETIN-A) 0.1 % cream Apply topically at bedtime. 04/17/17   Kuneff, Renee A, DO  venlafaxine XR (EFFEXOR-XR) 75 MG 24 hr capsule Take 150 mg by mouth daily with breakfast.     [provider]  zolpidem (AMBIEN) 10 MG tablet Take 10 mg by  mouth at bedtime.    [provider]    Family History Family History  Problem Relation Age of Onset  . Arthritis Mother   . Heart disease Mother        ?psvt  . Breast cancer Mother 42       TAH/BSO  . Cancer Mother   . Mental illness Mother   . COPD Father   . Hypertension Father   . Alcohol abuse Father   . Mental illness Father   . Heart disease Father   . Healthy Daughter   . Breast cancer Maternal Aunt 27       deceased  . Cancer Cousin 74       female; unknown primary  . Colon cancer Paternal Aunt 33       deceased at 68  . Stomach cancer Paternal Uncle 49       deceased at 38  . Alcohol abuse Brother   . Cancer Brother   . Hodgkin's lymphoma Brother   . Mental illness Brother   . HIV Brother   . Healthy Brother   . Healthy Son     Social History Social History   Tobacco Use  . Smoking status: Former Smoker    Packs/day: 1.00    Years: 10.00    Pack years: 10.00    Types: Cigarettes    Last attempt to quit: 12/06/2005    Years since quitting: 11.5  . Smokeless tobacco: Never Used  Substance Use Topics  . Alcohol use: Yes    Comment: 02/23/2017 "might  have 1-2 drinks/month"  . Drug use: Yes    Types: Marijuana    Comment: "i smoke marijuana to avoid opiates"      Allergies   Aspirin; Ibuprofen; Ativan [lorazepam]; Ketamine; Toradol [ketorolac tromethamine]; and Tramadol hcl   Review of Systems Review of Systems  Constitutional: Negative.   HENT: Negative.   Respiratory: Negative.   Cardiovascular: Positive for chest pain.  Gastrointestinal: Negative.   Musculoskeletal: Negative.   Skin: Negative.   Neurological: Negative.   Psychiatric/Behavioral: Negative.   All other systems reviewed and are negative.    Physical Exam Updated Vital Signs BP 130/78   Pulse 79   Temp 98.2 F (36.8 C) (Oral)   Resp 18   SpO2 97%   Physical Exam  Constitutional: She is oriented to person, place, and time. She appears well-developed and well-nourished.  HENT:  Head: Normocephalic and atraumatic.  Eyes: Pupils are equal, round, and reactive to light. Conjunctivae are normal.  Neck: Neck supple. No tracheal deviation present. No thyromegaly present.  Cardiovascular: Normal rate and regular rhythm.  No murmur heard. Pulmonary/Chest: Effort normal and breath sounds normal. She exhibits tenderness.  Chest without ecchymosis minimally tender at left mid ribs.  No crepitance or flail  Abdominal: Soft. Bowel sounds are normal. She exhibits no distension. There is no tenderness.  Musculoskeletal: Normal range of motion. She exhibits no edema or tenderness.  Neurological: She is alert and oriented to person, place, and time. Coordination normal.  Gait normal motor strength 5/5 overall  Skin: Skin is warm and dry. No rash noted.  Psychiatric: She has a normal mood and affect.  Nursing note and vitals reviewed.    ED Treatments / Results  Labs (all labs ordered are listed, but only abnormal results are displayed) Labs Reviewed - No data to display  EKG None  Radiology No results found.  Procedures Procedures (including critical  care time)  Medications  Ordered in ED Medications - No data to display  Declines pain medicatrion now Initial Impression / Assessment and Plan / ED Course  I have reviewed the triage vital signs and the nursing notes.  Pertinent labs & imaging results that were available during my care of the patient were reviewed by me and considered in my medical decision making (see chart for details).    X-rays viewed by me.  Patient requesting pain medicine stronger than Tylenol.  She cannot take NSAIDs.  I suggested tramadol.  Patient has problem with opioids in the past.  She will be given written prescription for norco 10 tablets.  Follow-up with PMD X-ray reviewed by me [facility] Controlled Substance reporting System queried Final Clinical Impressions(s) / ED Diagnoses   Dx #1 Fall #2 left 6th rib fx Final diagnoses:  None    ED Discharge Orders    None       Doug Sou, MD 06/24/17 7517    Doug Sou, MD 06/24/17 [phone]

## 2017-06-24 NOTE — Discharge Instructions (Addendum)
Take Tylenol for mild pain or the pain medicine prescribed for bad pain.  Do not take Tylenol together with the pain medicine prescribed as the combination can be dangerous to your liver.  Do not take the pain medicine prescribed together with Xanax or alcohol as the combination can cause you to stop breathing if having significant pain in a week follow-up with your primary care doctor.

## 2017-06-24 NOTE — ED Notes (Signed)
Pt ambulated and complains of left rib pain.  Seen by EDP.  Pt is alert and states pain increases a little with movement and increases with breathing in or coughing

## 2017-07-06 ENCOUNTER — Other Ambulatory Visit (INDEPENDENT_AMBULATORY_CARE_PROVIDER_SITE_OTHER): Payer: Self-pay | Admitting: Specialist

## 2017-07-06 NOTE — Telephone Encounter (Signed)
meloxicam refill request 

## 2017-08-06 ENCOUNTER — Telehealth: Payer: Managed Care, Other (non HMO) | Admitting: Family Medicine

## 2017-08-06 DIAGNOSIS — N390 Urinary tract infection, site not specified: Secondary | ICD-10-CM

## 2017-08-06 MED ORDER — CEPHALEXIN 500 MG PO CAPS: 500.0000 mg | ORAL_CAPSULE | Freq: Two times a day (BID) | ORAL | 0 refills | Status: AC

## 2017-08-06 NOTE — Progress Notes (Signed)

## 2017-08-22 ENCOUNTER — Emergency Department (HOSPITAL_COMMUNITY): Payer: Managed Care, Other (non HMO)

## 2017-08-22 ENCOUNTER — Emergency Department (HOSPITAL_COMMUNITY)
Admission: EM | Admit: 2017-08-22 | Discharge: 2017-08-22 | Disposition: A | Discharge status: Home / Self Care | Payer: Managed Care, Other (non HMO) | Attending: Emergency Medicine | Admitting: Emergency Medicine

## 2017-08-22 ENCOUNTER — Encounter (HOSPITAL_COMMUNITY): Payer: Self-pay | Admitting: *Deleted

## 2017-08-22 ENCOUNTER — Telehealth (INDEPENDENT_AMBULATORY_CARE_PROVIDER_SITE_OTHER): Payer: Self-pay | Admitting: Orthopaedic Surgery

## 2017-08-22 ENCOUNTER — Other Ambulatory Visit: Payer: Self-pay

## 2017-08-22 DIAGNOSIS — M79604 Pain in right leg: Secondary | ICD-10-CM | POA: Diagnosis present

## 2017-08-22 DIAGNOSIS — I10 Essential (primary) hypertension: Secondary | ICD-10-CM | POA: Diagnosis not present

## 2017-08-22 DIAGNOSIS — J45909 Unspecified asthma, uncomplicated: Secondary | ICD-10-CM | POA: Insufficient documentation

## 2017-08-22 DIAGNOSIS — Z87891 Personal history of nicotine dependence: Secondary | ICD-10-CM | POA: Diagnosis not present

## 2017-08-22 DIAGNOSIS — Z79899 Other long term (current) drug therapy: Secondary | ICD-10-CM | POA: Diagnosis not present

## 2017-08-22 DIAGNOSIS — M5440 Lumbago with sciatica, unspecified side: Secondary | ICD-10-CM | POA: Diagnosis not present

## 2017-08-22 DIAGNOSIS — M79605 Pain in left leg: Secondary | ICD-10-CM | POA: Insufficient documentation

## 2017-08-22 LAB — COMPREHENSIVE METABOLIC PANEL
ALBUMIN: 4 g/dL (ref 3.5–5.0)
ALT: 20 U/L (ref 14–54)
ANION GAP: 9 (ref 5–15)
AST: 22 U/L (ref 15–41)
Alkaline Phosphatase: 103 U/L (ref 38–126)
BUN: 14 mg/dL (ref 6–20)
CHLORIDE: 104 mmol/L (ref 101–111)
CO2: 25 mmol/L (ref 22–32)
Calcium: 9.2 mg/dL (ref 8.9–10.3)
Creatinine, Ser: 0.79 mg/dL (ref 0.44–1.00)
GFR calc non Af Amer: >60 mL/min (ref >60)
GLUCOSE: 94 mg/dL (ref 65–99)
Potassium: 4 mmol/L (ref 3.5–5.1)
SODIUM: 138 mmol/L (ref 135–145)
Total Bilirubin: 0.4 mg/dL (ref 0.3–1.2)
Total Protein: 7.1 g/dL (ref 6.5–8.1)

## 2017-08-22 LAB — CBC WITH DIFFERENTIAL/PLATELET
BASOS PCT: 0 %
Basophils Absolute: 0 10*3/uL (ref 0.0–0.1)
EOS ABS: 0.2 10*3/uL (ref 0.0–0.7)
EOS PCT: 2 %
HCT: 39 % (ref 36.0–46.0)
Hemoglobin: 12.6 g/dL (ref 12.0–15.0)
Lymphocytes Relative: 22 %
Lymphs Abs: 1.6 10*3/uL (ref 0.7–4.0)
MCH: 27.1 pg (ref 26.0–34.0)
MCHC: 32.3 g/dL (ref 30.0–36.0)
MCV: 83.9 fL (ref 78.0–100.0)
MONOS PCT: 8 %
Monocytes Absolute: 0.6 10*3/uL (ref 0.1–1.0)
NEUTROS PCT: 68 %
Neutro Abs: 4.8 10*3/uL (ref 1.7–7.7)
PLATELETS: 282 10*3/uL (ref 150–400)
RBC: 4.65 MIL/uL (ref 3.87–5.11)
RDW: 15.6 % — ABNORMAL HIGH (ref 11.5–15.5)
WBC: 7.1 10*3/uL (ref 4.0–10.5)

## 2017-08-22 MED ORDER — ONDANSETRON HCL 4 MG/2ML IJ SOLN
4.0000 mg | Freq: Once | INTRAMUSCULAR | Status: AC
  Administered 2017-08-22: 4 mg via INTRAVENOUS
  Filled 2017-08-22: qty 2

## 2017-08-22 MED ORDER — OXYCODONE-ACETAMINOPHEN 5-325 MG PO TABS: 1.0000 | ORAL_TABLET | ORAL | 0 refills | Status: DC | PRN

## 2017-08-22 MED ORDER — HYDROMORPHONE HCL 1 MG/ML IJ SOLN
1.0000 mg | Freq: Once | INTRAMUSCULAR | Status: AC
  Administered 2017-08-22: 1 mg via INTRAVENOUS
  Filled 2017-08-22: qty 1

## 2017-08-22 MED ORDER — PREDNISONE 10 MG PO TABS: 20.0000 mg | ORAL_TABLET | Freq: Every day | ORAL | 0 refills | Status: DC

## 2017-08-22 NOTE — ED Notes (Signed)
To MRI

## 2017-08-22 NOTE — ED Notes (Signed)
Bed: [facility] Expected date:  Expected time:  Means of arrival:  Comments: EMS/Leg pain

## 2017-08-22 NOTE — Discharge Instructions (Signed)
He is a walker to ambulate with and follow-up with Dr. Ophelia Charter within a week

## 2017-08-22 NOTE — ED Triage Notes (Signed)
EMS reports pt was traveling and walking more than normal, started to have bil hip/leg pain described as 7 days muscle spasms, 3 days ago pain increased when up on feet but is relived when lying down. 144/78-80-18-100% 8/10 pain when walking.

## 2017-08-22 NOTE — ED Provider Notes (Addendum)
[facility]-EMERGENCY DEPT Provider Note   CSN: [id number] Arrival date & time: 08/22/17  0017     History   Chief Complaint Chief Complaint  Patient presents with  . Leg Pain    HPI Kara [name] is a 62 y.o. female.  Patient complains of severe pain lower back going down both legs.  Patient has a history of previous back surgery  The history is provided by the patient. No language interpreter was used.  Leg Pain   This is a new problem. The current episode started more than 2 days ago. The problem occurs constantly. The problem has not changed since onset.Pain location: Both thighs. The quality of the pain is described as dull. The pain is at a severity of 8/10. The pain is moderate. Pertinent negatives include no stiffness. Exacerbated by: Movement and standing.    Past Medical History:  Diagnosis Date  . ADD (attention deficit disorder)    on Adderal  . Aggressive behavior of adult   . Anemia   . Anxiety   . Cellulitis of breast 11/2013   RIGHT BREAST  . Childhood asthma   . 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 Chronic lower back pain   . Chronic pain    went to [facility] for pain control; stopped in 2016 " (02/23/2017)  . Cold sore   . 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 Degenerative disc disease, lumbar   . Depression    takes meds daily  . Family history of malignant neoplasm of breast   . Fibromyalgia   . History of kidney stones   . Hypertension   . Osteoporosis   . Other specified rheumatoid arthritis, right shoulder (HCC) 08/01/2011  . Pneumonia 2010?  [name] Post-nasal drip    hx of  . Pre-diabetes   . RA (rheumatoid arthritis) (HCC)    autoimmune arthritis    Patient Active Problem List   Diagnosis Date Noted  . Decreased libido 03/29/2017  . Lumbar stenosis 02/23/2017  . Spinal stenosis of lumbar region with neurogenic claudication 02/19/2017  . S/P  lumbar spinal fusion 01/01/2017  . Essential hypertension 07/26/2016  . DNR (do not resuscitate) discussion   . Palliative care by specialist   . Cervical pseudoarthrosis (HCC) 06/21/2016  . Opioid dependence in remission (HCC) 06/14/2016  . Mitral valve prolapse 01/04/2016  . HNP (herniated nucleus pulposus), lumbar 12/10/2015  . Prediabetes 08/19/2015  . Overweight (BMI 25.0-29.9) 08/18/2015  . Chronic pain 08/18/2015  . Vitamin D deficiency 08/18/2015  . Chronic fatigue disorder 06/14/2015  . S/P cervical spinal fusion 03/15/2015  . PTSD (post-traumatic stress disorder) 03/07/2014  . Severe recurrent major depression without psychotic features (HCC) 03/06/2014  . Suicide threat or attempt 03/06/2014  . Aggressive behavior   . Family history of malignant neoplasm of breast   . Rheumatoid arthritis (HCC) 12/13/2011  . Fibromyalgia 12/13/2011  . H/O cold sores 12/13/2011    Past Surgical History:  Procedure Laterality Date  . ANTERIOR CERVICAL DECOMP/DISCECTOMY FUSION N/A 03/15/2015   Procedure: Cervical five-six, Cerival six-seven, Anterior Cervical Discectomy and Fusion, Allograft and Plate;  Surgeon: Eldred Manges, MD;  Location: [facility] OR;  Service: Orthopedics;  Laterality: N/A;  . AUGMENTATION MAMMAPLASTY  2003  . BACK SURGERY    . BLADDER SUSPENSION  2009  . BREAST IMPLANT REMOVAL Bilateral 10/2013  . CERVICAL WOUND DEBRIDEMENT N/A 07/07/2016   Procedure:  IRRIGATION AND DEBRIDEMENT POSTERIOR NECK;  Surgeon: Eldred Manges, MD;  Location: [facility] OR;  Service: Orthopedics;  Laterality: N/A;  . COLONOSCOPY    . COMBINED ABDOMINOPLASTY AND LIPOSUCTION  2003  . INCISION AND DRAINAGE ABSCESS Right 01/16/2014   Procedure: INCISION AND DRAINAGE AND OF RIGHT BREAST ABCESS;  Surgeon: Glenna Fellows, MD;  Location: [facility] ORS;  Service: General;  Laterality: Right;  . JOINT REPLACEMENT    . LUMBAR LAMINECTOMY/DECOMPRESSION MICRODISCECTOMY N/A 12/10/2015   Procedure: Right L3-4 Hemilaminectomy,  Excision of herniated nucleus pulposus;  Surgeon: Eldred Manges, MD;  Location: [facility] OR;  Service: Orthopedics;  Laterality: N/A;  . MASS EXCISION  11/03/2011   Procedure: MINOR EXCISION OF MASS;  Surgeon: Wyn Forster., MD;  Location: [facility];  Service: Orthopedics;  Laterality: Left;  debride IP joint, cyst excision left index  . MAXIMUM ACCESS (MAS) TRANSFORAMINAL LUMBAR INTERBODY FUSION (TLIF) 2 LEVEL Right 02/23/2017  . POSTERIOR CERVICAL FUSION/FORAMINOTOMY N/A 06/21/2016   Procedure: POSTERIOR CERVICAL FUSION C5-C7 SPINOUS PROCESS WIRING;  Surgeon: Eldred Manges, MD;  Location: [facility] OR;  Service: Orthopedics;  Laterality: N/A;  . POSTERIOR LUMBAR FUSION  07/2009; 07/03/2014   "L4-5; L5-S1"  . SHOULDER ARTHROSCOPY Right 2012  . TONSILLECTOMY  1987  . TOTAL SHOULDER ARTHROPLASTY  08/01/2011   Procedure: TOTAL SHOULDER ARTHROPLASTY;  Surgeon: Eulas Post, MD;  Location: [facility] OR;  Service: Orthopedics;  Laterality: Right;  Right total shoulder arthroplasty  . TUBAL LIGATION  1988     OB History   None      Home Medications    Prior to Admission medications   Medication Sig Start Date End Date Taking? Authorizing Provider  alendronate (FOSAMAX) 35 MG tablet Take 1 tablet (35 mg total) by mouth every 7 (seven) days. Take with a full glass of water on an empty stomach. 05/02/17  Yes Kuneff, Renee A, DO  ALPRAZolam (XANAX) 1 MG tablet Take 1 mg by mouth 4 (four) times daily as needed for anxiety (takes for pain which causes anxiety).  07/06/14  Yes [provider]  amphetamine-dextroamphetamine (ADDERALL XR) 30 MG 24 hr capsule Take 60 mg by mouth daily.   Yes [provider]  amphetamine-dextroamphetamine (ADDERALL) 30 MG tablet Take 30 mg by mouth every evening.  05/26/17  Yes [provider]  cholecalciferol (VITAMIN D) 1000 units tablet Take 1,000 Units by mouth daily.   Yes [provider]  HYDROcodone-acetaminophen (NORCO) 5-325 MG  tablet Take 1 tablet by mouth every 6 (six) hours as needed for severe pain. 06/24/17  Yes Doug Sou, MD  lisinopril (PRINIVIL,ZESTRIL) 10 MG tablet Take 1.5 tablets (15 mg total) by mouth daily. 04/17/17  Yes Kuneff, Renee A, DO  MAGNESIUM PO Take 1 tablet by mouth daily.   Yes [provider]  meloxicam (MOBIC) 15 MG tablet TAKE 1 TABLET (15 MG) BY MOUTH EVERY DAY 07/06/17  Yes Kerrin Champagne, MD  Milnacipran HCl (SAVELLA) 25 MG TABS Take 25 mg by mouth daily.   Yes [provider]  pyridoxine (B-6) 100 MG tablet Take 100 mg by mouth 2 (two) times daily.   Yes [provider]  tretinoin (RETIN-A) 0.1 % cream Apply topically at bedtime. 04/17/17  Yes Kuneff, Renee A, DO  valACYclovir (VALTREX) 1000 MG tablet TAKE TWO TABLETS ONSET OF COLDSORE AND REPEAT 2 TABLETS IN 12 HOURS 08/10/17  Yes [provider]  venlafaxine XR (EFFEXOR-XR) 75 MG 24 hr capsule Take 150  mg by mouth daily with breakfast.    Yes [provider]  zolpidem (AMBIEN) 10 MG tablet Take 10 mg by mouth at bedtime.   Yes [provider]  oxyCODONE-acetaminophen (PERCOCET) 5-325 MG tablet Take 1 tablet by mouth every 4 (four) hours as needed. 08/22/17   Bethann Berkshire, MD  predniSONE (DELTASONE) 10 MG tablet Take 2 tablets (20 mg total) by mouth daily. 08/22/17   Bethann Berkshire, MD    Family History Family History  Problem Relation Age of Onset  . Arthritis Mother   . Heart disease Mother        ?psvt  . Breast cancer Mother 55       TAH/BSO  . Cancer Mother   . Mental illness Mother   . COPD Father   . Hypertension Father   . Alcohol abuse Father   . Mental illness Father   . Heart disease Father   . Healthy Daughter   . Breast cancer Maternal Aunt 27       deceased  . Cancer Cousin 84       female; unknown primary  . Colon cancer Paternal Aunt 92       deceased at 46  . Stomach cancer Paternal Uncle 18       deceased at 31  . Alcohol abuse Brother   . Cancer  Brother   . Hodgkin's lymphoma Brother   . Mental illness Brother   . HIV Brother   . Healthy Brother   . Healthy Son     Social History Social History   Tobacco Use  . Smoking status: Former Smoker    Packs/day: 1.00    Years: 10.00    Pack years: 10.00    Types: Cigarettes    Last attempt to quit: 12/06/2005    Years since quitting: 11.7  . Smokeless tobacco: Never Used  Substance Use Topics  . Alcohol use: Yes    Comment: 02/23/2017 "might have 1-2 drinks/month"  . Drug use: Yes    Types: Marijuana    Comment: "i smoke marijuana to avoid opiates"      Allergies   Aspirin; Bee venom; Ibuprofen; Ativan [lorazepam]; Ketamine; Toradol [ketorolac tromethamine]; and Tramadol hcl   Review of Systems Review of Systems  Constitutional: Negative for appetite change and fatigue.  HENT: Negative for congestion, ear discharge and sinus pressure.   Eyes: Negative for discharge.  Respiratory: Negative for cough.   Cardiovascular: Negative for chest pain.  Gastrointestinal: Negative for abdominal pain and diarrhea.  Genitourinary: Negative for frequency and hematuria.  Musculoskeletal: Positive for back pain. Negative for stiffness.  Skin: Negative for rash.  Neurological: Negative for seizures and headaches.  Psychiatric/Behavioral: Negative for hallucinations.     Physical Exam Updated Vital Signs BP 104/62 (BP Location: Left Arm)   Pulse 72   Temp 98.3 F (36.8 C)   Resp 16   Ht 5\' 4"  (1.626 m)   Wt 68 kg (150 lb)   SpO2 90%   BMI 25.75 kg/m   Physical Exam  Constitutional: She is oriented to person, place, and time. She appears well-developed.  HENT:  Head: Normocephalic.  Eyes: Conjunctivae and EOM are normal. No scleral icterus.  Neck: Neck supple. No thyromegaly present.  Cardiovascular: Normal rate and regular rhythm. Exam reveals no gallop and no friction rub.  No murmur heard. Pulmonary/Chest: No stridor. She has no wheezes. She has no rales. She  exhibits no tenderness.  Abdominal: She exhibits no distension. There  is no tenderness. There is no rebound.  Musculoskeletal: Normal range of motion. She exhibits no edema.  Tender lumbar spine  Lymphadenopathy:    She has no cervical adenopathy.  Neurological: She is oriented to person, place, and time. She exhibits normal muscle tone. Coordination normal.  Skin: No rash noted. No erythema.  Psychiatric: She has a normal mood and affect. Her behavior is normal.     ED Treatments / Results  Labs (all labs ordered are listed, but only abnormal results are displayed) Labs Reviewed  CBC WITH DIFFERENTIAL/PLATELET - Abnormal; Notable for the following components:      Result Value   RDW 15.6 (*)    All other components within normal limits  COMPREHENSIVE METABOLIC PANEL    EKG None  Radiology Mr Lumbar Spine Wo Contrast  Result Date: 08/22/2017 CLINICAL DATA:  62 year old female with 1 week of muscle spasms, bilateral hip and leg pain. Symptoms exacerbated with weight-bearing. No known injury. Prior lumbosacral surgery. EXAM: MRI LUMBAR SPINE WITHOUT CONTRAST TECHNIQUE: Multiplanar, multisequence MR imaging of the lumbar spine was performed. No intravenous contrast was administered. COMPARISON:  Lumbar radiographs 03/08/2017. Intraoperative lumbar images [id number]. Lumbar MRI 02/10/2017. FINDINGS: Segmentation: Normal on the December radiographs which is the same numbering system used on the 2018 MRI. There are small vestigial sacral disc spaces most pronounced at S1-S2. Alignment: Stable vertebral height and alignment. Chronic grade 1 anterolisthesis at L5-S1. Vertebrae: Chronic benign L1 vertebral body hemangioma. New degenerative appearing central and posterior endplate marrow edema at L2-L3 (series 5, image 8). Spinal hardware susceptibility artifact from L3-S1. Marrow signal at those levels is within normal limits. Intact visible sacrum and SI joints. Conus medullaris and cauda equina:  Conus extends to the T12-L1 level. No lower spinal cord or conus signal abnormality. Paraspinal and other soft tissues: Postoperative changes to the posterior lumbar soft tissues. No postoperative fluid collection. Cholelithiasis (series 6, image 5). Otherwise negative visible abdominal and pelvic viscera. Disc levels: Stable lower thoracic levels. L1-L2: Mild disc space loss with left eccentric disc bulge and mild facet and ligament flavum hypertrophy are stable. No stenosis. L2-L3: Progressed disc space loss with broad-based posterior and biforaminal disc bulging. Progressed moderate to severe facet hypertrophy. Probable new right side degenerative synovial cyst measuring 5 millimeters on series 6, image 14. Underlying degenerative bilateral facet joint fluid. New moderate spinal stenosis with right greater than left lateral recess stenosis. Increased moderate bilateral L2 neural foraminal stenosis. L3-L4: Interval decompression and fusion with posterior and interbody hardware. Resolved spinal stenosis. Resolved bilateral L3 neural foraminal stenosis. L4-L5:  Stable status post decompression and fusion. L5-S1:  Stable status post decompression and fusion. IMPRESSION: 1. Progressed L2-L3 spinal degeneration since November 2018 with new multifactorial moderate spinal stenosis, moderate to severe right lateral recess stenosis, and moderate biforaminal stenosis. 2. Interval decompression and fusion at L3-L4 with resolved stenosis at that level. 3. Stable preexisting L4 through S1 decompression and fusion. Electronically Signed   By: Odessa Fleming M.D.   On: 08/22/2017 10:56    Procedures Procedures (including critical care time)  Medications Ordered in ED Medications  HYDROmorphone (DILAUDID) injection 1 mg (1 mg Intravenous Given 08/22/17 0949)  ondansetron (ZOFRAN) injection 4 mg (4 mg Intravenous Given 08/22/17 0949)     Initial Impression / Assessment and Plan / ED Course  I have reviewed the triage vital  signs and the nursing notes.  Pertinent labs & imaging results that were available during my care of the patient were reviewed  by me and considered in my medical decision making (see chart for details).     CBC and chemistries unremarkable.  MRI shows  worsening spinal stenosis.  Patient improved somewhat Dilaudid pain medicine.  She will be sent home with Percocet given a course of prednisone and told to use a walker to ambulate with.  She will also follow-up with her orthopedic or spine specialist Dr. Ophelia Charter in a week  Final Clinical Impressions(s) / ED Diagnoses   Final diagnoses:  Bilateral low back pain with sciatica, sciatica laterality unspecified, unspecified chronicity    ED Discharge Orders        Ordered    oxyCODONE-acetaminophen (PERCOCET) 5-325 MG tablet  Every 4 hours PRN     08/22/17 1308    predniSONE (DELTASONE) 10 MG tablet  Daily     08/22/17 1309       Bethann Berkshire, MD 08/22/17 1312    Bethann Berkshire, MD 08/31/17 1415

## 2017-08-22 NOTE — Telephone Encounter (Signed)
Patient called stating that she is in the hospital because she woke up not being able to walk. They took another MRI and said that her back is getting worse. Needs an appointment before the 18th of June. If you could give her a call back at [phone]

## 2017-08-23 ENCOUNTER — Encounter (INDEPENDENT_AMBULATORY_CARE_PROVIDER_SITE_OTHER): Payer: Self-pay | Admitting: Orthopaedic Surgery

## 2017-08-24 NOTE — Telephone Encounter (Signed)
Patient also sent My Chart message. I have offered work in appt. Waiting to hear back from her

## 2017-08-27 ENCOUNTER — Other Ambulatory Visit: Payer: Self-pay | Admitting: *Deleted

## 2017-08-27 NOTE — Patient Outreach (Addendum)
[facility]) Care Management  08/27/2017  Kara [name] [birth year] [id number]   Subjective:  Case discussed with Annitta Needs at [facility] on 08/24/17.  Per [employer] patient has had 7 ED visits since January of 2019, 21 office visits, gap score 10.7, and total medical spend to date is 153,000.00.   [facility] Care Management RNCM will outreach to patient, complete [facility] Consult, and refer to internal [employer] CM program if needed.    Case discussed with [facility] Care Management Assistant Clinical Director, Livia Snellen, states she is in agreement for this RNCM to reach out to patient for [facility] High ED Utilization Consult.   Telephone call to patient's home / mobile number, spoke with patient, and HIPAA verified.  Discussed [facility] Care Management [employer] Transition of care / ED High Utilization follow up, patient voiced understanding, and is in agreement to follow up.   Patient states she is doing well, currently in bed, her recent ED visit was related to bulging disc secondary to Degenerative disc disease, lumbar, and will call primary MD today to schedule ED  follow up appointment.   Patient states she has had several ED visits since January 2019 that have been related to the side effects/ symptoms of RA (rheumatoid arthritis), Fibromyalgia, and Degenerative disc disease.  States she is planning to discuss alternative treatment options other than Humira, maybe other biologics, with rheumatologist.  Patient states she has tried Humira for RA several times in the past, it has decreased her quality of life, and causes her to be confined to bed.   States she has been on Keflex in the past for approximately 30 days at a time and she had no RA symptoms during that time.   Patient states she is aware of the risk of long term antibiotics treatment, may be willing to take to risk to increase quality of life, and is planning to discuss this with her providers.  Patient states she is able to manage self care with medical treatment  plan and has assistance as needed with activities of daily living /  Home management as needed.  Patient voices understanding of medical diagnosis and treatment plan.   States she is accessing her [employer] benefits as needed via member services number on back of card or through [email or web address]  States she is planning to call Rosann Auerbach today to request internal Case Management program for chronic disease management and she is in agreement for this RNCM to send secure email referral for [facility] Internal CM program.  Patient states she does not have any education material, transition of care, care coordination, transportation, community resource, or pharmacy needs at this time.  States she is very appreciative of the follow up and is in agreement to receive [facility] Care Management information.  States this RNCM has provided her with a lot of useful information, has been very kind, professional, is planning to write a letter to [facility]  President to express her gratitude for this RNCM,  and the services provided.  Patient also has questions regarding her behavioral records content, reasons for mental health breakdown in the past, RNCM advised patient to follow up with behavioral health provider to discuss her questions, patient voices understanding, and states she will follow up.      Objective: Per KPN (Knowledge Performance Now, point of care tool), [employer] iCollaborate, and chart review, patient hospitalized 02/23/17- 02/24/17 for Lumbar stenosis, and status post L3-4 right transforaminal interbody fusion with cage using local bone pedicle  instrumentation.  Removal of previous L4-S1 rods and caps bilaterally with rotation 80 mm rods right and left.  Bilateral intertransverse process fusion at L3-4.  8 mm T-PAL banana shaped cage with 6 mm x 40 mm pedicle screws. ( removal of previous segmental posterior instrumentation and one level fusion above old fusion, re-exploration and decompression at L3-4 site  of previous surgery, operative microscope used , instrumentation from L3-S1 ) on 02/23/17.   Patient also has a history of ADD (attention deficit disorder), Childhood asthma,  Degenerative disc disease, lumbar, Fibromyalgia, hypertension, Osteoporosis, Pre-diabetes, and RA (rheumatoid arthritis).      Assessment: Received [employer] High ED Utilization follow up referral on 08/23/17.   ED visit follow up completed, no [facility] Care Management needs, will refer to [facility] Internal CM program, and will proceed with case closure.      Plan: RNCM will send patient successful outreach letter, [facility] pamphlet, and magnet. RNCM will complete case closure due to follow up completed / no care management needs.  RNCM will refer patient to [facility] Internal CM program for chronic disease management.  RNCM will send Annitta Needs at [facility] secure email update on above conversation.       Marjean Imperato H. Gardiner Barefoot, BSN, CCM [facility] Care Management [facility] Telephonic CM Phone: [phone] Fax: [phone]

## 2017-08-28 ENCOUNTER — Ambulatory Visit: Payer: Managed Care, Other (non HMO) | Admitting: Family Medicine

## 2017-08-29 ENCOUNTER — Ambulatory Visit: Payer: Managed Care, Other (non HMO) | Admitting: Family Medicine

## 2017-08-29 DIAGNOSIS — Z0289 Encounter for other administrative examinations: Secondary | ICD-10-CM

## 2017-08-30 ENCOUNTER — Encounter (INDEPENDENT_AMBULATORY_CARE_PROVIDER_SITE_OTHER): Payer: Self-pay | Admitting: Radiology

## 2017-08-30 ENCOUNTER — Emergency Department (HOSPITAL_COMMUNITY)
Admission: EM | Admit: 2017-08-30 | Discharge: 2017-08-30 | Disposition: A | Discharge status: Home / Self Care | Payer: Managed Care, Other (non HMO) | Attending: Emergency Medicine | Admitting: Emergency Medicine

## 2017-08-30 ENCOUNTER — Emergency Department (HOSPITAL_COMMUNITY)
Admission: EM | Admit: 2017-08-30 | Discharge: 2017-08-30 | Discharge status: Against Medical Advice | Payer: Managed Care, Other (non HMO) | Source: Home / Self Care

## 2017-08-30 ENCOUNTER — Encounter (HOSPITAL_COMMUNITY): Payer: Self-pay | Admitting: Emergency Medicine

## 2017-08-30 ENCOUNTER — Telehealth (INDEPENDENT_AMBULATORY_CARE_PROVIDER_SITE_OTHER): Payer: Self-pay | Admitting: Radiology

## 2017-08-30 ENCOUNTER — Encounter (HOSPITAL_COMMUNITY): Payer: Self-pay

## 2017-08-30 ENCOUNTER — Emergency Department (HOSPITAL_COMMUNITY)
Admission: EM | Admit: 2017-08-30 | Discharge: 2017-09-01 | Disposition: A | Discharge status: Other Acute Inpatient Hospital | Payer: Managed Care, Other (non HMO) | Source: Home / Self Care | Attending: Emergency Medicine | Admitting: Emergency Medicine

## 2017-08-30 ENCOUNTER — Other Ambulatory Visit: Payer: Self-pay

## 2017-08-30 DIAGNOSIS — F332 Major depressive disorder, recurrent severe without psychotic features: Secondary | ICD-10-CM

## 2017-08-30 DIAGNOSIS — Z87891 Personal history of nicotine dependence: Secondary | ICD-10-CM | POA: Insufficient documentation

## 2017-08-30 DIAGNOSIS — Z79899 Other long term (current) drug therapy: Secondary | ICD-10-CM | POA: Insufficient documentation

## 2017-08-30 DIAGNOSIS — M549 Dorsalgia, unspecified: Secondary | ICD-10-CM | POA: Insufficient documentation

## 2017-08-30 DIAGNOSIS — I10 Essential (primary) hypertension: Secondary | ICD-10-CM | POA: Insufficient documentation

## 2017-08-30 DIAGNOSIS — R45851 Suicidal ideations: Secondary | ICD-10-CM

## 2017-08-30 DIAGNOSIS — Z96611 Presence of right artificial shoulder joint: Secondary | ICD-10-CM

## 2017-08-30 DIAGNOSIS — Z5321 Procedure and treatment not carried out due to patient leaving prior to being seen by health care provider: Secondary | ICD-10-CM | POA: Diagnosis not present

## 2017-08-30 DIAGNOSIS — F1910 Other psychoactive substance abuse, uncomplicated: Secondary | ICD-10-CM

## 2017-08-30 DIAGNOSIS — J45909 Unspecified asthma, uncomplicated: Secondary | ICD-10-CM

## 2017-08-30 LAB — SALICYLATE LEVEL

## 2017-08-30 LAB — ETHANOL: Alcohol, Ethyl (B): <10 mg/dL (ref <10)

## 2017-08-30 LAB — CBC WITH DIFFERENTIAL/PLATELET
BASOS ABS: 0 10*3/uL (ref 0.0–0.1)
BASOS PCT: 0 %
EOS ABS: 0.1 10*3/uL (ref 0.0–0.7)
Eosinophils Relative: 1 %
HCT: 38.9 % (ref 36.0–46.0)
HEMOGLOBIN: 12.6 g/dL (ref 12.0–15.0)
Lymphocytes Relative: 17 %
Lymphs Abs: 1.9 10*3/uL (ref 0.7–4.0)
MCH: 27.2 pg (ref 26.0–34.0)
MCHC: 32.4 g/dL (ref 30.0–36.0)
MCV: 84 fL (ref 78.0–100.0)
MONOS PCT: 8 %
Monocytes Absolute: 0.9 10*3/uL (ref 0.1–1.0)
NEUTROS PCT: 75 %
Neutro Abs: 8.7 10*3/uL — ABNORMAL HIGH (ref 1.7–7.7)
Platelets: 340 10*3/uL (ref 150–400)
RBC: 4.63 MIL/uL (ref 3.87–5.11)
RDW: 15.4 % (ref 11.5–15.5)
WBC: 11.5 10*3/uL — AB (ref 4.0–10.5)

## 2017-08-30 LAB — COMPREHENSIVE METABOLIC PANEL
ALK PHOS: 99 U/L (ref 38–126)
ALT: 20 U/L (ref 14–54)
AST: 36 U/L (ref 15–41)
Albumin: 4.3 g/dL (ref 3.5–5.0)
Anion gap: 9 (ref 5–15)
BUN: 15 mg/dL (ref 6–20)
CHLORIDE: 108 mmol/L (ref 101–111)
CO2: 25 mmol/L (ref 22–32)
CREATININE: 0.88 mg/dL (ref 0.44–1.00)
Calcium: 9.6 mg/dL (ref 8.9–10.3)
GFR calc Af Amer: >60 mL/min (ref >60)
GFR calc non Af Amer: >60 mL/min (ref >60)
GLUCOSE: 91 mg/dL (ref 65–99)
Potassium: 3.6 mmol/L (ref 3.5–5.1)
SODIUM: 142 mmol/L (ref 135–145)
Total Bilirubin: 0.3 mg/dL (ref 0.3–1.2)
Total Protein: 7.4 g/dL (ref 6.5–8.1)

## 2017-08-30 LAB — RAPID URINE DRUG SCREEN, HOSP PERFORMED
AMPHETAMINES: POSITIVE — AB
BARBITURATES: NOT DETECTED
Benzodiazepines: POSITIVE — AB
Cocaine: NOT DETECTED
Opiates: POSITIVE — AB
Tetrahydrocannabinol: POSITIVE — AB

## 2017-08-30 LAB — ACETAMINOPHEN LEVEL

## 2017-08-30 MED ORDER — VENLAFAXINE HCL ER 75 MG PO CP24
150.0000 mg | ORAL_CAPSULE | Freq: Every day | ORAL | Status: DC
  Administered 2017-08-31 – 2017-09-01 (×2): 150 mg via ORAL
  Filled 2017-08-30 (×3): qty 2

## 2017-08-30 MED ORDER — ZOLPIDEM TARTRATE 10 MG PO TABS
10.0000 mg | ORAL_TABLET | Freq: Every evening | ORAL | Status: DC | PRN
Start: 2017-08-30 — End: 2017-09-01
  Administered 2017-08-30: 10 mg via ORAL
  Filled 2017-08-30: qty 1

## 2017-08-30 MED ORDER — VITAMIN D3 25 MCG (1000 UNIT) PO TABS
1000.0000 [IU] | ORAL_TABLET | Freq: Every day | ORAL | Status: DC
  Administered 2017-08-31 – 2017-09-01 (×2): 1000 [IU] via ORAL
  Filled 2017-08-30 (×2): qty 1

## 2017-08-30 MED ORDER — MIDAZOLAM HCL 2 MG/2ML IJ SOLN
6.0000 mg | Freq: Once | INTRAMUSCULAR | Status: AC
  Administered 2017-08-30: 6 mg via INTRAMUSCULAR
  Filled 2017-08-30: qty 6

## 2017-08-30 MED ORDER — ALPRAZOLAM 1 MG PO TABS
1.0000 mg | ORAL_TABLET | Freq: Four times a day (QID) | ORAL | Status: DC | PRN
  Administered 2017-08-30: 1 mg via ORAL
  Filled 2017-08-30: qty 1

## 2017-08-30 MED ORDER — LISINOPRIL 10 MG PO TABS
15.0000 mg | ORAL_TABLET | Freq: Every day | ORAL | Status: DC
  Administered 2017-08-31 – 2017-09-01 (×2): 15 mg via ORAL
  Filled 2017-08-30 (×2): qty 1.5

## 2017-08-30 MED ORDER — PREDNISONE 10 MG PO TABS: ORAL_TABLET | ORAL | 0 refills | Status: DC

## 2017-08-30 MED ORDER — CYCLOBENZAPRINE HCL 10 MG PO TABS
10.0000 mg | ORAL_TABLET | Freq: Every day | ORAL | Status: DC
  Administered 2017-08-30: 10 mg via ORAL
  Filled 2017-08-30 (×2): qty 1

## 2017-08-30 MED ORDER — STERILE WATER FOR INJECTION IJ SOLN
INTRAMUSCULAR | Status: AC
  Filled 2017-08-30: qty 10

## 2017-08-30 MED ORDER — OLANZAPINE 10 MG IM SOLR
10.0000 mg | Freq: Once | INTRAMUSCULAR | Status: AC
  Administered 2017-08-30: 10 mg via INTRAMUSCULAR
  Filled 2017-08-30: qty 10

## 2017-08-30 MED ORDER — ACETAMINOPHEN-CODEINE #3 300-30 MG PO TABS: 1.0000 | ORAL_TABLET | Freq: Three times a day (TID) | ORAL | 0 refills | Status: DC | PRN

## 2017-08-30 NOTE — ED Notes (Signed)
Bed: WA26 Expected date:  Expected time:  Means of arrival:  Comments: 
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
## 2017-08-30 NOTE — ED Notes (Signed)
VS to be assessed post medicated related to pt loud, aggressive, and pacing.

## 2017-08-30 NOTE — Telephone Encounter (Signed)
[name] called triage this morning stating they had spoken with Dr. Ophelia Charter on his cell phone and that he wanted the patient to be seen in the office today by another provider. After speaking with Dr. Ophelia Charter and advising patient has already been seen in the ED and had a MRI, he asked that the patient come to the office to see him on Monday, June 10 at 11am.  He advised to repeat prednisone (same as written from ED) and to call in Tylenol #3 1 po tid #20.    I spoke with patient's husband and advised.

## 2017-08-30 NOTE — ED Triage Notes (Signed)
Pt arrives via EMS. Complains of lower back pain. Pt was referred to surgeon, with appointment on Monday. Pt is ambulatory for EMS.
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
## 2017-08-30 NOTE — ED Notes (Addendum)
Pt Kara [name] [phone] (pt husband) request contacted if change in condition or prior to discharge.  Attempted to obtain vital signs pt refused.

## 2017-08-30 NOTE — ED Triage Notes (Addendum)
Pt in waiting room and left. Pt brought in by police verbalizing SI "drinking and 7-10 xanax taken. Pt yelling with triage.

## 2017-08-30 NOTE — ED Notes (Signed)
Pt yelling- escorted out by security. Pt will be leaving.

## 2017-08-30 NOTE — ED Notes (Signed)
Labs obtained

## 2017-08-31 ENCOUNTER — Inpatient Hospital Stay
Admission: AD | Admit: 2017-08-31 | Payer: Managed Care, Other (non HMO) | Source: Intra-hospital | Admitting: Psychiatry

## 2017-08-31 ENCOUNTER — Encounter: Payer: Self-pay | Admitting: Psychiatry

## 2017-08-31 ENCOUNTER — Telehealth (INDEPENDENT_AMBULATORY_CARE_PROVIDER_SITE_OTHER): Payer: Self-pay | Admitting: Orthopaedic Surgery

## 2017-08-31 ENCOUNTER — Encounter (INDEPENDENT_AMBULATORY_CARE_PROVIDER_SITE_OTHER): Payer: Self-pay | Admitting: Radiology

## 2017-08-31 DIAGNOSIS — F1320 Sedative, hypnotic or anxiolytic dependence, uncomplicated: Secondary | ICD-10-CM | POA: Diagnosis present

## 2017-08-31 DIAGNOSIS — F1220 Cannabis dependence, uncomplicated: Secondary | ICD-10-CM | POA: Diagnosis present

## 2017-08-31 MED ORDER — GABAPENTIN 600 MG PO TABS
300.0000 mg | ORAL_TABLET | Freq: Two times a day (BID) | ORAL | Status: DC
  Filled 2017-08-31: qty 0.5

## 2017-08-31 MED ORDER — ZIPRASIDONE MESYLATE 20 MG IM SOLR
20.0000 mg | Freq: Once | INTRAMUSCULAR | Status: AC
  Administered 2017-08-31: 20 mg via INTRAMUSCULAR
  Filled 2017-08-31: qty 20

## 2017-08-31 MED ORDER — OLANZAPINE 10 MG IM SOLR
10.0000 mg | Freq: Once | INTRAMUSCULAR | Status: AC
  Administered 2017-08-31: 10 mg via INTRAMUSCULAR
  Filled 2017-08-31: qty 10

## 2017-08-31 MED ORDER — STERILE WATER FOR INJECTION IJ SOLN
INTRAMUSCULAR | Status: AC
  Administered 2017-08-31: 2.1 mL
  Filled 2017-08-31: qty 10

## 2017-08-31 MED ORDER — GABAPENTIN 300 MG PO CAPS
300.0000 mg | ORAL_CAPSULE | Freq: Two times a day (BID) | ORAL | Status: DC
  Administered 2017-09-01: 300 mg via ORAL
  Filled 2017-08-31 (×2): qty 1

## 2017-08-31 MED ORDER — DIPHENHYDRAMINE HCL 50 MG/ML IJ SOLN
25.0000 mg | Freq: Once | INTRAMUSCULAR | Status: AC
  Administered 2017-08-31: 25 mg via INTRAMUSCULAR
  Filled 2017-08-31: qty 1

## 2017-08-31 NOTE — Progress Notes (Signed)
Pt is recommended for inpt treatment per Nira Conn, NP. TTS to seek placement. [facility] currently at capacity per [facility]. EDP Molpus, John, MD and pts nurse Reita Cliche, RN have been advised of the disposition.  Princess Bruins, MSW, LCSW Therapeutic Triage Specialist  [phone]

## 2017-08-31 NOTE — ED Notes (Signed)
Bed:  [facility] Expected date:  Expected time:  Means of arrival:  Comments: seculsion

## 2017-08-31 NOTE — [facility] Counselor (Signed)
Clinician discussed with Latricia, RN pt has been accepted to [facility].   Redmond Pulling, MS, [facility], [facility] Triage Specialist [phone]
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
## 2017-08-31 NOTE — ED Notes (Signed)
Patient alert and oriented, calm and cooperative.  Denies SI, HI, and AVH.  Patient reports she has been anxious and depressed over her husband cheating on her and an impending divorce.  She wanted to find out what injection we gave her yesterday because it "helped he go to sleep right away."

## 2017-08-31 NOTE — ED Provider Notes (Signed)
6:38 AM Asked to see patient after fall in the department.  She is complaining of pain in her forearms, right greater than left.  On examination she has bruising of various ages with no bony deformity or focal bony point tenderness.  She does have a fresh appearing abrasion to her right elbow.  I do not believe any radiographic studies are indicated at this time.     Noble Bodie, [name], MD 08/31/17 [phone]

## 2017-08-31 NOTE — BH Assessment (Signed)
Patient has been accepted to [facility].  Accepting physician is Dr. Jennet Maduro.  Attending Physician will be Dr. Johnella Moloney.  Patient has been assigned to room 320, by [facility] Charge Nurse [place].  Call report to [phone].  Representative/Transfer Coordinator is Breven Guidroz Patient pre-admitted by [facility] Patient Access Mertie Clause)  [facility] ER Staff Thurston Pounds, TTS) made aware of acceptance.

## 2017-08-31 NOTE — Telephone Encounter (Signed)
Spoke to husband to inform that wife's appointment for Monday was cancelled. Further, the practice is reviewing discharing Kara [name] due to behavior exhibited at clinic on Thursday.

## 2017-08-31 NOTE — BH Assessment (Signed)
Patient evaluated by Dr. Sharma Covert and Wyvonna Plum, NP. Inpatient Treatment was recommended, pending collateral information from patient's spouse Kara [name]). Writer obtained verbal consent to speak with spouse. Per Kara [name], patient has bits of rages and this is an ongoing issue x6 months. He reports that patient did consume multiple pills but doesn't believe it was a suicide attempt. Patient with history of trauma. He does not feel comfortable with patient returning home at this time due to intensity of rages. He reports that patient's rages are worsening and would like her stable before discharging home. Writer shared information with providers. Based on spouse's concerns patient to remain in the ED until placement is sought. No appropriate [facility] beds at this time. Patient referred to outside facilities including: [facility], [place], [place], [place], [place] [address], [address], [place], [address], [place], and [facility].

## 2017-08-31 NOTE — ED Notes (Signed)
Patient refused vitals Rn latricia made aware

## 2017-09-01 ENCOUNTER — Other Ambulatory Visit: Payer: Self-pay

## 2017-09-01 ENCOUNTER — Other Ambulatory Visit (INDEPENDENT_AMBULATORY_CARE_PROVIDER_SITE_OTHER): Payer: Self-pay | Admitting: Specialist

## 2017-09-01 ENCOUNTER — Inpatient Hospital Stay
Admission: AD | Admit: 2017-09-01 | Discharge: 2017-09-05 | DRG: 885 | Disposition: A | Discharge status: Home / Self Care | Payer: Managed Care, Other (non HMO) | Attending: Psychiatry | Admitting: Psychiatry

## 2017-09-01 DIAGNOSIS — F4310 Post-traumatic stress disorder, unspecified: Secondary | ICD-10-CM | POA: Diagnosis present

## 2017-09-01 DIAGNOSIS — Z791 Long term (current) use of non-steroidal anti-inflammatories (NSAID): Secondary | ICD-10-CM | POA: Diagnosis not present

## 2017-09-01 DIAGNOSIS — F1220 Cannabis dependence, uncomplicated: Secondary | ICD-10-CM | POA: Diagnosis present

## 2017-09-01 DIAGNOSIS — F419 Anxiety disorder, unspecified: Secondary | ICD-10-CM | POA: Diagnosis present

## 2017-09-01 DIAGNOSIS — F410 Panic disorder [episodic paroxysmal anxiety] without agoraphobia: Secondary | ICD-10-CM | POA: Diagnosis present

## 2017-09-01 DIAGNOSIS — Z818 Family history of other mental and behavioral disorders: Secondary | ICD-10-CM

## 2017-09-01 DIAGNOSIS — Z885 Allergy status to narcotic agent status: Secondary | ICD-10-CM | POA: Diagnosis not present

## 2017-09-01 DIAGNOSIS — F332 Major depressive disorder, recurrent severe without psychotic features: Principal | ICD-10-CM | POA: Diagnosis present

## 2017-09-01 DIAGNOSIS — Z87891 Personal history of nicotine dependence: Secondary | ICD-10-CM

## 2017-09-01 DIAGNOSIS — M48062 Spinal stenosis, lumbar region with neurogenic claudication: Secondary | ICD-10-CM | POA: Diagnosis present

## 2017-09-01 DIAGNOSIS — Z91410 Personal history of adult physical and sexual abuse: Secondary | ICD-10-CM

## 2017-09-01 DIAGNOSIS — Z79899 Other long term (current) drug therapy: Secondary | ICD-10-CM | POA: Diagnosis not present

## 2017-09-01 DIAGNOSIS — F909 Attention-deficit hyperactivity disorder, unspecified type: Secondary | ICD-10-CM | POA: Diagnosis present

## 2017-09-01 DIAGNOSIS — M797 Fibromyalgia: Secondary | ICD-10-CM | POA: Diagnosis present

## 2017-09-01 DIAGNOSIS — F988 Other specified behavioral and emotional disorders with onset usually occurring in childhood and adolescence: Secondary | ICD-10-CM | POA: Diagnosis present

## 2017-09-01 DIAGNOSIS — G8929 Other chronic pain: Secondary | ICD-10-CM | POA: Diagnosis present

## 2017-09-01 DIAGNOSIS — Z888 Allergy status to other drugs, medicaments and biological substances status: Secondary | ICD-10-CM

## 2017-09-01 DIAGNOSIS — M069 Rheumatoid arthritis, unspecified: Secondary | ICD-10-CM | POA: Diagnosis present

## 2017-09-01 DIAGNOSIS — Z886 Allergy status to analgesic agent status: Secondary | ICD-10-CM | POA: Diagnosis not present

## 2017-09-01 DIAGNOSIS — I10 Essential (primary) hypertension: Secondary | ICD-10-CM | POA: Diagnosis present

## 2017-09-01 DIAGNOSIS — F1320 Sedative, hypnotic or anxiolytic dependence, uncomplicated: Secondary | ICD-10-CM | POA: Diagnosis present

## 2017-09-01 DIAGNOSIS — R7303 Prediabetes: Secondary | ICD-10-CM | POA: Diagnosis present

## 2017-09-01 DIAGNOSIS — F1120 Opioid dependence, uncomplicated: Secondary | ICD-10-CM | POA: Diagnosis present

## 2017-09-01 DIAGNOSIS — Z91030 Bee allergy status: Secondary | ICD-10-CM

## 2017-09-01 DIAGNOSIS — F4325 Adjustment disorder with mixed disturbance of emotions and conduct: Secondary | ICD-10-CM | POA: Diagnosis present

## 2017-09-01 DIAGNOSIS — G4700 Insomnia, unspecified: Secondary | ICD-10-CM | POA: Diagnosis present

## 2017-09-01 DIAGNOSIS — R45851 Suicidal ideations: Secondary | ICD-10-CM | POA: Diagnosis present

## 2017-09-01 DIAGNOSIS — M549 Dorsalgia, unspecified: Secondary | ICD-10-CM | POA: Diagnosis not present

## 2017-09-01 MED ORDER — OLANZAPINE 10 MG IM SOLR
10.0000 mg | Freq: Once | INTRAMUSCULAR | Status: AC
  Administered 2017-09-01: 10 mg via INTRAMUSCULAR
  Filled 2017-09-01: qty 10

## 2017-09-01 MED ORDER — AMPHETAMINE-DEXTROAMPHET ER 5 MG PO CP24: 60.0000 mg | ORAL_CAPSULE | Freq: Every day | ORAL | Status: DC

## 2017-09-01 MED ORDER — CHLORDIAZEPOXIDE HCL 25 MG PO CAPS: 25.0000 mg | ORAL_CAPSULE | Freq: Every day | ORAL | Status: DC

## 2017-09-01 MED ORDER — ADULT MULTIVITAMIN W/MINERALS CH
1.0000 | ORAL_TABLET | Freq: Every day | ORAL | Status: DC
  Administered 2017-09-01 – 2017-09-02 (×2): 1 via ORAL
  Filled 2017-09-01 (×5): qty 1

## 2017-09-01 MED ORDER — ZOLPIDEM TARTRATE 5 MG PO TABS: 10.0000 mg | ORAL_TABLET | Freq: Every evening | ORAL | Status: DC | PRN

## 2017-09-01 MED ORDER — DIPHENHYDRAMINE HCL 25 MG PO CAPS
50.0000 mg | ORAL_CAPSULE | Freq: Every evening | ORAL | Status: DC | PRN
  Filled 2017-09-01: qty 2

## 2017-09-01 MED ORDER — LISINOPRIL 20 MG PO TABS
10.0000 mg | ORAL_TABLET | Freq: Every day | ORAL | Status: DC
  Administered 2017-09-01 – 2017-09-04 (×4): 10 mg via ORAL
  Filled 2017-09-01 (×5): qty 1

## 2017-09-01 MED ORDER — VALACYCLOVIR HCL 500 MG PO TABS
1000.0000 mg | ORAL_TABLET | Freq: Every day | ORAL | Status: DC
  Administered 2017-09-01 – 2017-09-04 (×4): 1000 mg via ORAL
  Filled 2017-09-01 (×6): qty 2

## 2017-09-01 MED ORDER — ZOLPIDEM TARTRATE 5 MG PO TABS
5.0000 mg | ORAL_TABLET | Freq: Every evening | ORAL | Status: DC | PRN
  Administered 2017-09-01: 5 mg via ORAL
  Filled 2017-09-01: qty 1

## 2017-09-01 MED ORDER — ALUM & MAG HYDROXIDE-SIMETH 200-200-20 MG/5ML PO SUSP: 30.0000 mL | ORAL | Status: DC | PRN

## 2017-09-01 MED ORDER — CHLORDIAZEPOXIDE HCL 25 MG PO CAPS: 25.0000 mg | ORAL_CAPSULE | Freq: Three times a day (TID) | ORAL | Status: DC

## 2017-09-01 MED ORDER — HALOPERIDOL LACTATE 5 MG/ML IJ SOLN: 10.0000 mg | Freq: Four times a day (QID) | INTRAMUSCULAR | Status: DC | PRN

## 2017-09-01 MED ORDER — STERILE WATER FOR INJECTION IJ SOLN
INTRAMUSCULAR | Status: AC
  Filled 2017-09-01: qty 10

## 2017-09-01 MED ORDER — VENLAFAXINE HCL ER 75 MG PO CP24
75.0000 mg | ORAL_CAPSULE | Freq: Every day | ORAL | Status: DC
  Administered 2017-09-02: 75 mg via ORAL
  Filled 2017-09-01: qty 1

## 2017-09-01 MED ORDER — DIPHENHYDRAMINE HCL 50 MG/ML IJ SOLN
50.0000 mg | Freq: Four times a day (QID) | INTRAMUSCULAR | Status: DC | PRN
Start: 2017-09-01 — End: 2017-09-03

## 2017-09-01 MED ORDER — CHLORDIAZEPOXIDE HCL 25 MG PO CAPS: 25.0000 mg | ORAL_CAPSULE | ORAL | Status: DC

## 2017-09-01 MED ORDER — VITAMIN B-1 100 MG PO TABS
100.0000 mg | ORAL_TABLET | Freq: Every day | ORAL | Status: DC
  Administered 2017-09-02 – 2017-09-05 (×4): 100 mg via ORAL
  Filled 2017-09-01 (×4): qty 1

## 2017-09-01 MED ORDER — HYDROCODONE-ACETAMINOPHEN 5-325 MG PO TABS
1.0000 | ORAL_TABLET | Freq: Four times a day (QID) | ORAL | Status: DC | PRN
  Administered 2017-09-01 – 2017-09-03 (×6): 1 via ORAL
  Filled 2017-09-01 (×6): qty 1

## 2017-09-01 MED ORDER — CHLORDIAZEPOXIDE HCL 25 MG PO CAPS
25.0000 mg | ORAL_CAPSULE | Freq: Four times a day (QID) | ORAL | Status: DC | PRN
  Filled 2017-09-01: qty 1

## 2017-09-01 MED ORDER — MAGNESIUM HYDROXIDE 400 MG/5ML PO SUSP: 30.0000 mL | Freq: Every day | ORAL | Status: DC | PRN

## 2017-09-01 MED ORDER — HALOPERIDOL 5 MG PO TABS
10.0000 mg | ORAL_TABLET | Freq: Four times a day (QID) | ORAL | Status: DC | PRN
Start: 2017-09-01 — End: 2017-09-05
  Administered 2017-09-01 – 2017-09-03 (×6): 10 mg via ORAL
  Filled 2017-09-01 (×6): qty 2

## 2017-09-01 MED ORDER — CYCLOBENZAPRINE HCL 10 MG PO TABS
10.0000 mg | ORAL_TABLET | Freq: Three times a day (TID) | ORAL | Status: DC | PRN
  Administered 2017-09-01 – 2017-09-03 (×4): 10 mg via ORAL
  Filled 2017-09-01 (×4): qty 1

## 2017-09-01 MED ORDER — TRAZODONE HCL 100 MG PO TABS
100.0000 mg | ORAL_TABLET | Freq: Every evening | ORAL | Status: DC | PRN
  Administered 2017-09-02: 100 mg via ORAL
  Filled 2017-09-01: qty 1

## 2017-09-01 MED ORDER — CHLORDIAZEPOXIDE HCL 25 MG PO CAPS
25.0000 mg | ORAL_CAPSULE | Freq: Four times a day (QID) | ORAL | Status: DC
  Administered 2017-09-01 – 2017-09-02 (×2): 25 mg via ORAL
  Filled 2017-09-01 (×2): qty 1

## 2017-09-01 MED ORDER — THIAMINE HCL 100 MG/ML IJ SOLN
100.0000 mg | Freq: Once | INTRAMUSCULAR | Status: AC
  Administered 2017-09-01: 100 mg via INTRAMUSCULAR
  Filled 2017-09-01 (×4): qty 1

## 2017-09-01 MED ORDER — ACETAMINOPHEN 325 MG PO TABS
650.0000 mg | ORAL_TABLET | Freq: Four times a day (QID) | ORAL | Status: DC | PRN
  Administered 2017-09-04: 650 mg via ORAL
  Filled 2017-09-01: qty 2

## 2017-09-01 NOTE — [facility] Group Notes (Signed)
[facility] Group Notes:  (Nursing/MHT/Case Management/Adjunct)  Date:  09/01/2017  Time:  9:20 PM  Type of Therapy:  Group Therapy  Participation Level:  Did Not Attend   Jinger Neighbors 09/01/2017, 9:20 PM

## 2017-09-01 NOTE — ED Notes (Signed)
[employer] TRANSPORT REQUESTED. 

## 2017-09-01 NOTE — [facility] Group Notes (Signed)
LCSW Group Therapy Note  09/01/2017 1:15pm  Type of Therapy and Topic: Group Therapy: Holding on to Grudges   Participation Level: Did Not Attend   Description of Group:  In this group patients will be asked to explore and define a grudge. Patients will be guided to discuss their thoughts, feelings, and reasons as to why people have grudges. Patients will process the impact grudges have on daily life and identify thoughts and feelings related to holding grudges. Facilitator will challenge patients to identify ways to let go of grudges and the benefits this provides. Patients will be confronted to address why one struggles letting go of grudges. Lastly, patients will identify feelings and thoughts related to what life would look like without grudges. This group will be process-oriented, with patients participating in exploration of their own experiences, giving and receiving support, and processing challenge from other group members.  Therapeutic Goals:  1. Patient will identify specific grudges related to their personal life.  2. Patient will identify feelings, thoughts, and beliefs around grudges.  3. Patient will identify how one releases grudges appropriately.  4. Patient will identify situations where they could have let go of the grudge, but instead chose to hold on.   Summary of Patient Progress: Pt was invited to attend group but chose not to attend. CSW will continue to encourage pt to attend group throughout their admission.    Therapeutic Modalities:  Cognitive Behavioral Therapy  Solution Focused Therapy  Motivational Interviewing  Brief Therapy   Malasha Kleppe  CUEBAS-COLON, LCSW 09/01/2017 12:53 PM

## 2017-09-01 NOTE — Tx Team (Signed)
Initial Treatment Plan 09/01/2017 11:42 AM [name]    PATIENT STRESSORS: Marital or family conflict   PATIENT STRENGTHS: Average or above average intelligence Capable of independent living Communication skills Physical Health   PATIENT IDENTIFIED PROBLEMS: Husband infidelity   Poor coping skills                    DISCHARGE CRITERIA:  Ability to meet basic life and health needs Adequate post-discharge living arrangements Improved stabilization in mood, thinking, and/or behaviorHusv  PRELIMINARY DISCHARGE PLAN: Participate in family therapy Return to previous living arrangement  PATIENT/FAMILY INVOLVEMENT: This treatment plan has been presented to and reviewed with the patient, Kara [name].  The patient has been given the opportunity to ask questions and make suggestions.  Rex Kras, RN 09/01/2017, 11:42 AM
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
## 2017-09-01 NOTE — ED Notes (Signed)
This nurse informed pt that sheriff was on the way to take her to [facility] for ongoing treatment . Pt became irate, yelling, screaming, verbally aggressive, throwing breakfast tray on floor. This nurse notified Jacki Cones, NP of pt behavior, awaiting orders.

## 2017-09-01 NOTE — Plan of Care (Signed)
Pt cooperative with treatment this evening. Talked with patient for good while after her visit with her husband. Patient was very upset and shared with me about some of her marital problems. No screaming or yelling noted. Pt stated she is not a bad person, just has a lot of rage towards her husband. After this Pt requested several PRN'S. (see MAR). Patient went to group but did not stay. She came back and requested her Ambien, she did receive it. At about 2200, patient stated her heart was beating fast. Took her vitals: 85/55, 92. Called to notify doctor. Pt encouraged to drink and given several cups of water. Dr. Huntley Dec indicated to not give patient any more norco or flexeril tonight. Pt did fall asleep not long after. Will continue to monitor. Problem: Education: Goal: Knowledge of [place] Education information/materials will improve Outcome: Progressing Goal: Emotional status will improve Outcome: Progressing Goal: Mental status will improve Outcome: Progressing Goal: Verbalization of understanding the information provided will improve Outcome: Progressing   Problem: Coping: Goal: Ability to demonstrate self-control will improve Outcome: Progressing   Problem: Health Behavior/Discharge Planning: Goal: Compliance with treatment plan for underlying cause of condition will improve Outcome: Progressing   Problem: Safety: Goal: Periods of time without injury will increase Outcome: Progressing

## 2017-09-01 NOTE — ED Notes (Signed)
Sheriff on unit to transfer pt to [facility] per MD order. Personal property given to sheriff for transport. Ambulatory off unit in [place] enforcement custody.

## 2017-09-02 ENCOUNTER — Encounter: Payer: Self-pay | Admitting: Psychiatry

## 2017-09-02 DIAGNOSIS — F988 Other specified behavioral and emotional disorders with onset usually occurring in childhood and adolescence: Secondary | ICD-10-CM | POA: Diagnosis present

## 2017-09-02 MED ORDER — DIVALPROEX SODIUM ER 500 MG PO TB24
500.0000 mg | ORAL_TABLET | Freq: Every day | ORAL | Status: DC
  Administered 2017-09-02: 500 mg via ORAL
  Filled 2017-09-02: qty 1

## 2017-09-02 MED ORDER — CHLORDIAZEPOXIDE HCL 25 MG PO CAPS: 25.0000 mg | ORAL_CAPSULE | Freq: Four times a day (QID) | ORAL | Status: DC | PRN

## 2017-09-02 MED ORDER — VENLAFAXINE HCL ER 75 MG PO CP24
150.0000 mg | ORAL_CAPSULE | Freq: Every day | ORAL | Status: DC
  Administered 2017-09-03 – 2017-09-05 (×3): 150 mg via ORAL
  Filled 2017-09-02 (×3): qty 2

## 2017-09-02 NOTE — [facility] Group Notes (Signed)
[facility] Group Notes:  (Nursing/MHT/Case Management/Adjunct)  Date:  09/02/2017  Time:  9:36 PM  Type of Therapy:  Group Therapy  Participation Level:  Active  Participation Quality:  Appropriate  Affect:  Anxious and Appropriate  Cognitive:  Appropriate  Insight:  Appropriate  Engagement in Group:  Distracting and Engaged  Modes of Intervention:  Clarification  Summary of Progress/Problems: Patient had multiple question that were answered in group.  Kara [name] 09/02/2017, 9:36 PM

## 2017-09-02 NOTE — Progress Notes (Signed)
Pt's Bp low this morning, I did notify doctor and encourage patient to drink water.

## 2017-09-02 NOTE — [facility] Group Notes (Signed)
LCSW Group Therapy Note 09/02/2017 1:15pm  Type of Therapy and Topic: Group Therapy: Feelings Around Returning Home & Establishing a Supportive Framework and Supporting Oneself When Supports Not Available  Participation Level: Did Not Attend  Description of Group:  Patients first processed thoughts and feelings about upcoming discharge. These included fears of upcoming changes, lack of change, new living environments, judgements and expectations from others and overall stigma of mental health issues. The group then discussed the definition of a supportive framework, what that looks and feels like, and how do to discern it from an unhealthy non-supportive network. The group identified different types of supports as well as what to do when your family/friends are less than helpful or unavailable  Therapeutic Goals  1. Patient will identify one healthy supportive network that they can use at discharge. 2. Patient will identify one factor of a supportive framework and how to tell it from an unhealthy network. 3. Patient able to identify one coping skill to use when they do not have positive supports from others. 4. Patient will demonstrate ability to communicate their needs through discussion and/or role plays.  Summary of Patient Progress:  Pt was invited to attend group but chose not to attend. CSW will continue to encourage pt to attend group throughout their admission.   Therapeutic Modalities Cognitive Behavioral Therapy Motivational Interviewing   Ahijah Devery  CUEBAS-COLON, LCSW 09/02/2017 9:13 AM

## 2017-09-02 NOTE — Progress Notes (Signed)
Weekend CSW made several attempts between Saturday and Sunday and was unable to complete PSA due to patient's behavior. The patient appeared to be very anxious and angry by having uncontrollable crying spells, screaming and pacing in the hallways for an extended period of time and throughout the day. During the last attempt the CSW was advised by the nurse to allow the patient to rest since she had a difficult morning.   Suleyman Ehrman Cuebas-Colon, LCSWA, LCASA 09/02/2017, 4:00PM
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
## 2017-09-03 ENCOUNTER — Ambulatory Visit (INDEPENDENT_AMBULATORY_CARE_PROVIDER_SITE_OTHER): Payer: Managed Care, Other (non HMO) | Admitting: Orthopaedic Surgery

## 2017-09-03 DIAGNOSIS — F332 Major depressive disorder, recurrent severe without psychotic features: Principal | ICD-10-CM

## 2017-09-03 LAB — I-STAT BETA HCG BLOOD, ED (MC, WL, AP ONLY)

## 2017-09-03 MED ORDER — HYDROCODONE-ACETAMINOPHEN 5-325 MG PO TABS
2.0000 | ORAL_TABLET | Freq: Two times a day (BID) | ORAL | Status: DC | PRN
  Administered 2017-09-03 – 2017-09-05 (×4): 2 via ORAL
  Filled 2017-09-03 (×4): qty 2

## 2017-09-03 MED ORDER — PRAZOSIN HCL 2 MG PO CAPS
2.0000 mg | ORAL_CAPSULE | Freq: Two times a day (BID) | ORAL | Status: DC
  Administered 2017-09-03 – 2017-09-04 (×3): 2 mg via ORAL
  Filled 2017-09-03 (×4): qty 1

## 2017-09-03 MED ORDER — HYDROCODONE-ACETAMINOPHEN 5-325 MG PO TABS
1.0000 | ORAL_TABLET | Freq: Two times a day (BID) | ORAL | Status: DC | PRN
  Filled 2017-09-03: qty 1

## 2017-09-03 MED ORDER — HYDROCODONE-ACETAMINOPHEN 5-325 MG PO TABS
1.0000 | ORAL_TABLET | Freq: Once | ORAL | Status: AC
  Administered 2017-09-03: 1 via ORAL

## 2017-09-03 MED ORDER — DIVALPROEX SODIUM ER 500 MG PO TB24
500.0000 mg | ORAL_TABLET | Freq: Three times a day (TID) | ORAL | Status: DC
  Administered 2017-09-03 – 2017-09-05 (×5): 500 mg via ORAL
  Filled 2017-09-03 (×5): qty 1

## 2017-09-03 NOTE — Progress Notes (Signed)
Patient alert and oriented x 4, endorsed generalized body pain and was medicated per standing order, affect is blunted, mood is irritable, was hostile towards nurse this evening, and she was frequently redirected,. Patient thoughts are organized , she appears less anxious, rated depression 6/10, she later was receptive to unit treatment plan.  15 minutes checks was maintained will continue to monitor.

## 2017-09-03 NOTE — BHH Suicide Risk Assessment (Signed)
[facility] [place] Admission Suicide Risk Assessment   Nursing information obtained from:  Patient Demographic factors:  Unemployed Current Mental Status:  Self-harm thoughts Loss Factors:  Decline in physical health Historical Factors:  Family history of mental illness or substance abuse Risk Reduction Factors:  Positive coping skills or problem solving skills  Total Time spent with patient: 1 hour Principal Problem: Major depressive disorder, recurrent severe without psychotic features (HCC) Diagnosis:   Patient Active Problem List   Diagnosis Date Noted  . ADD (attention deficit disorder) [F98.8] 09/02/2017  . Major depressive disorder, recurrent severe without psychotic features (HCC) [F33.2] 09/01/2017  . Sedative, hypnotic or anxiolytic use disorder, severe, dependence (HCC) [F13.20] 08/31/2017  . Cannabis use disorder, moderate, dependence (HCC) [F12.20] 08/31/2017  . Decreased libido [R68.82] 03/29/2017  . Lumbar stenosis [M48.061] 02/23/2017  . 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 S/P lumbar spinal fusion [Z98.1] 01/01/2017  . Essential hypertension [I10] 07/26/2016  . DNR (do not resuscitate) discussion [Z71.89]   . Palliative care by specialist [Z51.5]   . Cervical pseudoarthrosis (HCC) [S12.9XXA] 06/21/2016  . Opioid use disorder, moderate, dependence (HCC) [F11.20] 06/14/2016  . Mitral valve prolapse [I34.1] 01/04/2016  . HNP (herniated nucleus pulposus), lumbar [M51.26] 12/10/2015  . Prediabetes [R73.03] 08/19/2015  . Overweight (BMI 25.0-29.9) [E66.3] 08/18/2015  . Chronic pain [G89.29] 08/18/2015  . Vitamin D deficiency [E55.9] 08/18/2015  . Chronic fatigue disorder [R53.82] 06/14/2015  . S/P cervical spinal fusion [Z98.1] 03/15/2015  . PTSD (post-traumatic stress disorder) [F43.10] 03/07/2014  . Suicide threat or attempt [R45.851] 03/06/2014  . Aggressive behavior [R46.89]   . Family history of malignant neoplasm of breast  [Z80.3]   . Rheumatoid arthritis (HCC) [M06.9] 12/13/2011  . Fibromyalgia [M79.7] 12/13/2011  . H/O cold sores [Z86.19] 12/13/2011   Subjective Data: see H&P  Continued Clinical Symptoms:  Alcohol Use Disorder Identification Test Final Score (AUDIT): 1 The "Alcohol Use Disorders Identification Test", Guidelines for Use in Primary Care, Second Edition.  World Science writer [facility]). Score between 0-7:  no or low risk or alcohol related problems. Score between 8-15:  moderate risk of alcohol related problems. Score between 16-19:  high risk of alcohol related problems. Score 20 or above:  warrants further diagnostic evaluation for alcohol dependence and treatment.   CLINICAL FACTORS:   Depression:   Aggression Hopelessness Impulsivity Insomnia Personality Disorders:   Cluster B Chronic Pain   Musculoskeletal: Strength & Muscle Tone: within normal limits Gait & Station: normal Patient leans: N/A  Psychiatric Specialty Exam: Physical Exam  ROS  Blood pressure 112/78, pulse 85, temperature 98.2 F (36.8 C), temperature source Oral, resp. rate 18, height 5\' 4"  (1.626 m), weight 67.1 kg (148 lb), SpO2 98 %.Body mass index is 25.4 kg/m.  General Appearance: Casual  Eye Contact:  Good  Speech:  Clear and Coherent  Volume:  Normal  Mood:  Depressed, Hopeless and Worthless  Affect:  Congruent, Constricted and Depressed  Thought Process:  Goal Directed and Linear  Orientation:  Full (Time, Place, and Person)  Thought Content:  Logical  Suicidal Thoughts:  No  Homicidal Thoughts:  No  Memory:  Immediate;   Fair Recent;   Fair Remote;   Fair  Judgement:  Fair  Insight:  Fair  Psychomotor Activity:  Normal  Concentration:  Concentration: Fair and Attention Span: Fair  Recall:  of Knowledge:  Fair  Language:  Good  Akathisia:  No      Assets:  Communication Skills Desire for Improvement Physical Health Resilience  ADL's:  Intact  Cognition:  WNL  Sleep:   Number of Hours: 7      COGNITIVE FEATURES THAT CONTRIBUTE TO RISK:  Closed-mindedness, Polarized thinking and Thought constriction (tunnel vision)    SUICIDE RISK:   Mild:  Suicidal ideation of limited frequency, intensity, duration, and specificity.  There are no identifiable plans, no associated intent, mild dysphoria and related symptoms, good self-control (both objective and subjective assessment), few other risk factors, and identifiable protective factors, including available and accessible social support.  PLAN OF CARE:  See H&P  I certify that inpatient services furnished can reasonably be expected to improve the patient's condition.   Shankar Silber, MD 09/03/2017, 8:56 AM

## 2017-09-03 NOTE — Plan of Care (Signed)
  Problem: Coping: Goal: Ability to verbalize frustrations and anger appropriately will improve Outcome: Progressing  Patient appears less anxious, verbalizing anger and frustrations to staff.

## 2017-09-03 NOTE — Progress Notes (Signed)
Recreation Therapy Notes  INPATIENT RECREATION THERAPY ASSESSMENT  Patient Details Name: Kara [name] MRN: [id number] DOB: 03-19-1956 Today's Date: 09/03/2017       Information Obtained From: Patient  Able to Participate in Assessment/Interview: Yes  Patient Presentation: Responsive  Reason for Admission (Per Patient): Aggressive/Threatening  Patient Stressors: Relationship  Coping Skills:   Music, Talk, Exercise  Leisure Interests (2+):  Exercise - Walking, Press photographer gardening(Boating, drinking coffee)  Frequency of Recreation/Participation: Monthly  Awareness of Community Resources:  Yes  Community Resources:  [employer], [employer]  Current Use: Yes  If no, Barriers?:    Expressed Interest in [employer] Information:    [place] of Residence:  [place]  Patient Main Form of Transportation: Set designer  Patient Strengths:  Compassionate, kind, caring, respectful  Patient Identified Areas of Improvement:  Be more consistent  Patient Goal for Hospitalization:  Get medication right  Current SI (including self-harm):  No  Current HI:  No  Current AVH: No  Staff Intervention Plan: Group Attendance, Collaborate with Interdisciplinary Treatment Team  Consent to Intern Participation: N/A  Aanya Haynes 09/03/2017, 2:44 PM

## 2017-09-03 NOTE — BHH Group Notes (Signed)
BHH Group Notes:  (Nursing/MHT/Case Management/Adjunct)  Date:  09/03/2017  Time:  9:32 PM  Type of Therapy:  Group Therapy  Participation Level:  Active  Participation Quality:  Appropriate  Affect:  Appropriate and Excited  Cognitive:  Appropriate  Insight:  Appropriate  Engagement in Group:  Engaged and Supportive  Modes of Intervention:  Discussion and Support  Summary of Progress/Problems:  Kara [name] 09/03/2017, 9:32 PM

## 2017-09-03 NOTE — Progress Notes (Signed)
Recreation Therapy Notes  Date: 09/03/2017  Time: 9:30 am  Location: Craft Room  Behavioral response: Appropriate   Intervention Topic: Creative Expressions  Discussion/Intervention:  Group content on today was focused on creative expressions. The group defined creative expressions and ways they use creative expressions. Individual identified other positive ways creative expressions can be used and why it is important to express yourself. Patients participated in the intervention "exploring creative expressions", where they had a chance to creatively express themselves.  Clinical Observations/Feedback:  Patient came to group and stated creative expressions are different for everyone because everyone expresses themselves differently. Individual identified hitting and saying mean things as negative ways she expresses herself.She stated she could express herself in a positive way by being kind. Patient participated in the intervention and was social with peers and staff during group. [name] LRT/CTRS         Kara [name] 09/03/2017 12:20 PM

## 2017-09-03 NOTE — Progress Notes (Signed)
[facility] MD Progress Note  09/03/2017 11:54 AM Kara [name]  MRN:  [id number]  Subjective:    [name] met with treatment team today. She is much more composed and in control of her behavior, making reasonable plans. Only few minutes this morning she was upset and crying loudly. She took Haldol this morning at the first sign of oncoming rage with good result. She seems sensible abou the medication changes. She did not take any Librium in the past 24 hours and has no symptoms of withdrawal. She took only two doses on Norco and is off stimulants. She was started on Depakote for mood stabilization. She is open to try Minipress for PTSD.  Principal Problem: Major depressive disorder, recurrent severe without psychotic features ([place]) Diagnosis:   Patient Active Problem List   Diagnosis Date Noted  . Major depressive disorder, recurrent severe without psychotic features ([place]) [F33.2] 09/01/2017    Priority: High  . ADD (attention deficit disorder) [F98.8] 09/02/2017  . Sedative, hypnotic or anxiolytic use disorder, severe, dependence ([place]) [F13.20] 08/31/2017  . Cannabis use disorder, moderate, dependence ([place]) [F12.20] 08/31/2017  . Decreased libido [R68.82] 03/29/2017  . Lumbar stenosis [M48.061] 02/23/2017  . 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 S/P lumbar spinal fusion [Z98.1] 01/01/2017  . Essential hypertension [I10] 07/26/2016  . DNR (do not resuscitate) discussion [Z71.89]   . Palliative care by specialist [Z51.5]   . Cervical pseudoarthrosis ([place]) [S12.9XXA] 06/21/2016  . Opioid use disorder, moderate, dependence ([place]) [F11.20] 06/14/2016  . Mitral valve prolapse [I34.1] 01/04/2016  . HNP (herniated nucleus pulposus), lumbar [M51.26] 12/10/2015  . Prediabetes [R73.03] 08/19/2015  . Overweight (BMI 25.0-29.9) [E66.3] 08/18/2015  . Chronic pain [G89.29] 08/18/2015  . Vitamin D deficiency [E55.9] 08/18/2015  . Chronic fatigue disorder [R53.82]  06/14/2015  . S/P cervical spinal fusion [Z98.1] 03/15/2015  . PTSD (post-traumatic stress disorder) [F43.10] 03/07/2014  . Suicide threat or attempt [R45.851] 03/06/2014  . Aggressive behavior [R46.89]   . Family history of malignant neoplasm of breast [Z80.3]   . Rheumatoid arthritis ([place]) [M06.9] 12/13/2011  . Fibromyalgia [M79.7] 12/13/2011  . H/O cold sores [Z86.19] 12/13/2011   Total Time spent with patient: 30 minutes  Past Psychiatric History: depression  Past Medical History:  Past Medical History:  Diagnosis Date  . ADD (attention deficit disorder)    on Adderal  . Aggressive behavior of adult   . Anemia   . Anxiety   . Cellulitis of breast 11/2013   RIGHT BREAST  . Childhood asthma   . Chronic fatigue and immune dysfunction syndrome ([place])   . Chronic lower back pain   . Chronic pain    went to [facility] for pain control; stopped in 2016 " (02/23/2017)  . Cold sore   . 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 Degenerative disc disease, lumbar   . Depression    takes meds daily  . Family history of malignant neoplasm of breast   . Fibromyalgia   . History of kidney stones   . Hypertension   . Osteoporosis   . Other specified rheumatoid arthritis, right shoulder ([place]) 08/01/2011  . Pneumonia 2010?  [name] Post-nasal drip    hx of  . Pre-diabetes   . RA (rheumatoid arthritis) (HCC)    autoimmune arthritis    Past Surgical History:  Procedure Laterality Date  . ANTERIOR CERVICAL DECOMP/DISCECTOMY FUSION N/A 03/15/2015   Procedure: Cervical  five-six, Cerival six-seven, Anterior Cervical Discectomy and Fusion, Allograft and Plate;  Surgeon: Marybelle Killings, MD;  Location: [place];  Service: Orthopedics;  Laterality: N/A;  . AUGMENTATION MAMMAPLASTY  2003  . BACK SURGERY    . BLADDER SUSPENSION  2009  . BREAST IMPLANT REMOVAL Bilateral 10/2013  . CERVICAL WOUND DEBRIDEMENT N/A 07/07/2016   Procedure: IRRIGATION AND  DEBRIDEMENT POSTERIOR NECK;  Surgeon: Marybelle Killings, MD;  Location: [place];  Service: Orthopedics;  Laterality: N/A;  . COLONOSCOPY    . COMBINED ABDOMINOPLASTY AND LIPOSUCTION  2003  . INCISION AND DRAINAGE ABSCESS Right 01/16/2014   Procedure: INCISION AND DRAINAGE AND OF RIGHT BREAST ABCESS;  Surgeon: Excell Seltzer, MD;  Location: [facility];  Service: General;  Laterality: Right;  . JOINT REPLACEMENT    . LUMBAR LAMINECTOMY/DECOMPRESSION MICRODISCECTOMY N/A 12/10/2015   Procedure: Right L3-4 Hemilaminectomy, Excision of herniated nucleus pulposus;  Surgeon: Marybelle Killings, MD;  Location: [place];  Service: Orthopedics;  Laterality: N/A;  . MASS EXCISION  11/03/2011   Procedure: MINOR EXCISION OF MASS;  Surgeon: Cammie Sickle., MD;  Location: [place];  Service: Orthopedics;  Laterality: Left;  debride IP joint, cyst excision left index  . MAXIMUM ACCESS (MAS) TRANSFORAMINAL LUMBAR INTERBODY FUSION (TLIF) 2 LEVEL Right 02/23/2017  . POSTERIOR CERVICAL FUSION/FORAMINOTOMY N/A 06/21/2016   Procedure: POSTERIOR CERVICAL FUSION C5-C7 SPINOUS PROCESS WIRING;  Surgeon: Marybelle Killings, MD;  Location: [place];  Service: Orthopedics;  Laterality: N/A;  . POSTERIOR LUMBAR FUSION  07/2009; 07/03/2014   "L4-5; L5-S1"  . SHOULDER ARTHROSCOPY Right 2012  . TONSILLECTOMY  1987  . TOTAL SHOULDER ARTHROPLASTY  08/01/2011   Procedure: TOTAL SHOULDER ARTHROPLASTY;  Surgeon: Johnny Bridge, MD;  Location: [place];  Service: Orthopedics;  Laterality: Right;  Right total shoulder arthroplasty  . TUBAL LIGATION  1988   Family History:  Family History  Problem Relation Age of Onset  . Arthritis Mother   . Heart disease Mother        ?psvt  . Breast cancer Mother 53       TAH/BSO  . Cancer Mother   . Mental illness Mother   . COPD Father   . Hypertension Father   . Alcohol abuse Father   . Mental illness Father   . Heart disease Father   . Healthy Daughter   . Breast cancer Maternal Aunt 27        deceased  . Cancer Cousin 52       female; unknown primary  . Colon cancer Paternal Aunt 11       deceased at 28  . Stomach cancer Paternal Uncle 46       deceased at 69  . Alcohol abuse Brother   . Cancer Brother   . Hodgkin's lymphoma Brother   . Mental illness Brother   . HIV Brother   . Healthy Brother   . Healthy Son    Family Psychiatric  History: none Social History:  Social History   Substance and Sexual Activity  Alcohol Use Yes   Comment: 02/23/2017 "might have 1-2 drinks/month"     Social History   Substance and Sexual Activity  Drug Use Yes  . Types: Marijuana   Comment: "i smoke marijuana to avoid opiates"     Social History   Socioeconomic History  . Marital status: Married    Spouse name: Not on file  . Number of children: Not on file  .  Years of education: Not on file  . Highest education level: Not on file  Occupational History  . Not on file  Social Needs  . Financial resource strain: Not on file  . Food insecurity:    Worry: Not on file    Inability: Not on file  . Transportation needs:    Medical: Not on file    Non-medical: Not on file  Tobacco Use  . Smoking status: Former Smoker    Packs/day: 1.00    Years: 10.00    Pack years: 10.00    Types: Cigarettes    Last attempt to quit: 12/06/2005    Years since quitting: 11.7  . Smokeless tobacco: Never Used  Substance and Sexual Activity  . Alcohol use: Yes    Comment: 02/23/2017 "might have 1-2 drinks/month"  . Drug use: Yes    Types: Marijuana    Comment: "i smoke marijuana to avoid opiates"   . Sexual activity: Yes    Birth control/protection: Post-menopausal  Lifestyle  . Physical activity:    Days per week: Not on file    Minutes per session: Not on file  . Stress: Not on file  Relationships  . Social connections:    Talks on phone: Not on file    Gets together: Not on file    Attends religious service: Not on file    Active member of club or organization: Not on file     Attends meetings of clubs or organizations: Not on file    Relationship status: Not on file  Other Topics Concern  . Not on file  Social History Narrative   Married, Carla Rashad.    6 children.    [employer], Retired Education officer, museum.    Denies alcohol, tobacco or drug use.   Drinks caffeinated beverages. Uses herbal remedies. Take a daily vitamin.   Wears her seatbelt. Exercises greater than 3 times a week.   Smoke detector in the home, firearms in the home in a locked cabinet, feels safe in her relationships.   Additional Social History:                         Sleep: Fair  Appetite:  Fair  Current Medications: Current Facility-Administered Medications  Medication Dose Route Frequency Provider Last Rate Last Dose  . acetaminophen (TYLENOL) tablet 650 mg  650 mg Oral Q6H PRN [name] B, MD      . alum & mag hydroxide-simeth (MAALOX/MYLANTA) 200-200-20 MG/5ML suspension 30 mL  30 mL Oral Q4H PRN Tiphany Fayson B, MD      . divalproex (DEPAKOTE ER) 24 hr tablet 500 mg  500 mg Oral TID Elmira Olkowski B, MD      . haloperidol (HALDOL) tablet 10 mg  10 mg Oral Q6H PRN Sausha Raymond B, MD   10 mg at 09/03/17 0858  . HYDROcodone-acetaminophen (NORCO/VICODIN) 5-325 MG per tablet 1 tablet  1 tablet Oral BID PRN Tyrika Newman B, MD      . lisinopril (PRINIVIL,ZESTRIL) tablet 10 mg  10 mg Oral Daily Charlane Westry B, MD   10 mg at 09/03/17 0830  . magnesium hydroxide (MILK OF MAGNESIA) suspension 30 mL  30 mL Oral Daily PRN Kaulana Brindle B, MD      . multivitamin with minerals tablet 1 tablet  1 tablet Oral Daily He, Jun, MD   1 tablet at 09/02/17 0923  . prazosin (MINIPRESS) capsule 2 mg  2 mg Oral BID  Emersyn Wyss B, MD      . thiamine (VITAMIN B-1) tablet 100 mg  100 mg Oral Daily He, Jun, MD   100 mg at 09/03/17 0830  . valACYclovir (VALTREX) tablet 1,000 mg  1,000 mg Oral Daily Arionne Iams B, MD   1,000 mg at 09/03/17 0830  .  venlafaxine XR (EFFEXOR-XR) 24 hr capsule 150 mg  150 mg Oral Q breakfast He, Jun, MD   150 mg at 09/03/17 0830    Lab Results: No results found for this or any previous visit (from the past 48 hour(s)).  Blood Alcohol level:  Lab Results  Component Value Date   ETH <10 08/30/2017   ETH 73 (H) 24/40/1027    Metabolic Disorder Labs: Lab Results  Component Value Date   HGBA1C 5.9 (H) 02/21/2017   MPG 122.63 02/21/2017   MPG 120 12/14/2015   No results found for: PROLACTIN Lab Results  Component Value Date   CHOL 278 (H) 08/18/2015   TRIG 182.0 (H) 08/18/2015   HDL 64.30 08/18/2015   CHOLHDL 4 08/18/2015   VLDL 36.4 08/18/2015   LDLCALC 178 (H) 08/18/2015   LDLCALC 121 (H) 08/01/2013    Physical Findings: AIMS:  , ,  ,  ,    CIWA:  CIWA-Ar Total: 0 COWS:     Musculoskeletal: Strength & Muscle Tone: within normal limits Gait & Station: normal Patient leans: N/A  Psychiatric Specialty Exam: Physical Exam  Nursing note and vitals reviewed. Psychiatric: Her speech is normal. Thought content normal. Her affect is labile. She is agitated. Cognition and memory are normal. She expresses impulsivity.    Review of Systems  Musculoskeletal: Positive for back pain.  Neurological: Negative.   Psychiatric/Behavioral: Positive for depression and substance abuse. The patient is nervous/anxious.   All other systems reviewed and are negative.   Blood pressure 112/78, pulse 85, temperature 98.2 F (36.8 C), temperature source Oral, resp. rate 18, height '5\' 4"'  (1.626 m), weight 67.1 kg (148 lb), SpO2 98 %.Body mass index is 25.4 kg/m.  General Appearance: Casual  Eye Contact:  Good  Speech:  Clear and Coherent  Volume:  Normal  Mood:  Anxious  Affect:  Labile  Thought Process:  Goal Directed and Descriptions of Associations: Intact  Orientation:  Full (Time, Place, and Person)  Thought Content:  WDL  Suicidal Thoughts:  No  Homicidal Thoughts:  No  Memory:  Immediate;    Fair Recent;   Fair Remote;   Fair  Judgement:  Poor  Insight:  Lacking  Psychomotor Activity:  Increased and Restlessness  Concentration:  Concentration: Fair and Attention Span: Fair  Recall:  [employer] of Knowledge:  Fair  Language:  Fair  Akathisia:  No  Handed:  Right  AIMS (if indicated):     Assets:  Communication Skills Desire for Improvement Financial Resources/Insurance Housing Physical Health Resilience Social Support  ADL's:  Intact  Cognition:  WNL  Sleep:  Number of Hours: 7     Treatment Plan Summary: Daily contact with patient to assess and evaluate symptoms and progress in treatment and Medication management   Ms. Pilger is a 62 year old female with a history of depression, anxiety and chronic pain. She has been a patient of Dr. Toy Care maintained on high dose of xanax and adderall. She is on Norco far back surgery as well. She presented to outside hospital with extreme rage over the past 3 days.   #Agitation, resolved -Haldol and Benadryl are available.   #  Mood instability -increase Depakote to 500 mg TID  -continue Effexor 150 mg daily    #Insomnia, resolved  # Anxiety disorder, PTSD -discontinue Xanax and Librium, no symptoms of withdrawal -start Minipress 2 mg BID for nightmares and flashbacks  #ADD -we did not offer Adderall  #Chronic pain -awaiting back surgery on 6/13 -Norco 10 mg BID PRN pain, no Rx will be given  #Disposition -discharge to home with husband Follow up with Dr. Toy Care   Prescription Medications Dependence and Abuse: pt has been getting concurrently from Dr. Chucky May  Adderall ER 9m daily + Adderall 345mdaily + Xanax 73m39maily + Ambien 61m35mily, + frequent opioids meds from different prescribers.  Per [place] Controlled substance database, she has had total of 38 prescribers for all different kind of controlled medications since 2013, and has had over 350 Rxs filled in 10 pharmacies.  This pt is at high risk for  iatrogenic addictions, and likely diverting some illicit medicines as well.   # Sedative, hypnotic (Benzo) abuse -- pt agreed to stop all Benzos.   -- stopped ambien, and will provide Trazodone prn for insomnia.   # ADD -- HOLD Adderall while on the unit, as it can increase anxiety, worsening agitation and insomnia.  Pt has not had adderall for 2 days, and she has NO concentration problem engaging in the interview this morning with the writProbation officer- holing adderall at least while in the hospital will allow her to sleep better, with less anxiety, and a break from adderall will likely make it more effective at a lower and safer dose when she does need help with concentration.  It will also give a chance to re-evaluation whether she truly has ADD, or the attention deficits were caused by opioids and Benzo use.   -- thus, Adderall was discontinued.   # Chronic Pain -- pt has hx of opioid dependence, she was in pain management clinic for years.  -- however, she needs back surgery and has acute pain. Thus, will continue narco PRN.  Will need to use with caution given her hx of opioid dependence.   # Dispo -- defer to primary team.     JolaOrson Slick 09/03/2017, 11:54 AM

## 2017-09-03 NOTE — Plan of Care (Signed)
Rates depression as a 2/10.  Tearful at times due to back pain.  Denies SI/HI/AVH.  Verbalizes feelings to this Clinical research associate without any difficulty.  No anger outburst.  Support and encouragement offered.  Safety maintained. On the unit.   Problem: Education: Goal: Knowledge of [place] [facility] Education information/materials will improve Outcome: Progressing Goal: Emotional status will improve Outcome: Progressing Goal: Mental status will improve Outcome: Progressing Goal: Verbalization of understanding the information provided will improve Outcome: Progressing   Problem: Coping: Goal: Ability to verbalize frustrations and anger appropriately will improve Outcome: Progressing Goal: Ability to demonstrate self-control will improve Outcome: Progressing   Problem: Health Behavior/Discharge Planning: Goal: Identification of resources available to assist in meeting health care needs will improve Outcome: Progressing Goal: Compliance with treatment plan for underlying cause of condition will improve Outcome: Progressing   Problem: Safety: Goal: Periods of time without injury will increase Outcome: Progressing

## 2017-09-03 NOTE — Tx Team (Addendum)
Interdisciplinary Treatment and Diagnostic Plan Update  09/03/2017 Time of Session: 10:30am Kara [name] MRN: [id number]  Principal Diagnosis: Major depressive disorder, recurrent severe without psychotic features (HCC)  Secondary Diagnoses: Principal Problem:   Major depressive disorder, recurrent severe without psychotic features (HCC) Active Problems:   Opioid use disorder, moderate, dependence (HCC)   Essential hypertension   Spinal stenosis of lumbar region with neurogenic claudication   Sedative, hypnotic or anxiolytic use disorder, severe, dependence (HCC)   Cannabis use disorder, moderate, dependence (HCC)   ADD (attention deficit disorder)   Current Medications:  Current Facility-Administered Medications  Medication Dose Route Frequency Provider Last Rate Last Dose  . acetaminophen (TYLENOL) tablet 650 mg  650 mg Oral Q6H PRN Pucilowska, Jolanta B, MD      . alum & mag hydroxide-simeth (MAALOX/MYLANTA) 200-200-20 MG/5ML suspension 30 mL  30 mL Oral Q4H PRN Pucilowska, Jolanta B, MD      . divalproex (DEPAKOTE ER) 24 hr tablet 500 mg  500 mg Oral TID Pucilowska, Jolanta B, MD   500 mg at 09/03/17 1657  . haloperidol (HALDOL) tablet 10 mg  10 mg Oral Q6H PRN Pucilowska, Jolanta B, MD   10 mg at 09/03/17 0858  . HYDROcodone-acetaminophen (NORCO/VICODIN) 5-325 MG per tablet 2 tablet  2 tablet Oral BID PRN Pucilowska, Jolanta B, MD      . lisinopril (PRINIVIL,ZESTRIL) tablet 10 mg  10 mg Oral Daily Pucilowska, Jolanta B, MD   10 mg at 09/03/17 0830  . magnesium hydroxide (MILK OF MAGNESIA) suspension 30 mL  30 mL Oral Daily PRN Pucilowska, Jolanta B, MD      . multivitamin with minerals tablet 1 tablet  1 tablet Oral Daily He, Jun, MD   1 tablet at 09/02/17 0923  . prazosin (MINIPRESS) capsule 2 mg  2 mg Oral BID Pucilowska, Jolanta B, MD   2 mg at 09/03/17 1657  . thiamine (VITAMIN B-1) tablet 100 mg  100 mg Oral Daily He, Jun, MD   100 mg at 09/03/17 0830  . valACYclovir (VALTREX)  tablet 1,000 mg  1,000 mg Oral Daily Pucilowska, Jolanta B, MD   1,000 mg at 09/03/17 0830  . venlafaxine XR (EFFEXOR-XR) 24 hr capsule 150 mg  150 mg Oral Q breakfast He, Jun, MD   150 mg at 09/03/17 0830   PTA Medications: Medications Prior to Admission  Medication Sig Dispense Refill Last Dose  . acetaminophen-codeine (TYLENOL #3) 300-30 MG tablet Take 1 tablet by mouth every 8 (eight) hours as needed for moderate pain. 20 tablet 0 08/30/2017 at Unknown time  . alendronate (FOSAMAX) 35 MG tablet Take 1 tablet (35 mg total) by mouth every 7 (seven) days. Take with a full glass of water on an empty stomach. (Patient not taking: Reported on 08/27/2017) 12 tablet 3 Not Taking at Unknown time  . ALPRAZolam (XANAX) 1 MG tablet Take 1 mg by mouth 4 (four) times daily as needed for anxiety (takes for pain which causes anxiety).    08/30/2017 at Unknown time  . amphetamine-dextroamphetamine (ADDERALL XR) 30 MG 24 hr capsule Take 60 mg by mouth daily.   08/30/2017 at Unknown time  . amphetamine-dextroamphetamine (ADDERALL) 30 MG tablet Take 30 mg by mouth every evening.   0 08/30/2017 at Unknown time  . cholecalciferol (VITAMIN D) 1000 units tablet Take 1,000 Units by mouth daily.   08/30/2017 at Unknown time  . cyclobenzaprine (FLEXERIL) 10 MG tablet Take 10 mg by mouth at bedtime. Takes as needed  08/30/2017 at Unknown time  . HYDROcodone-acetaminophen (NORCO) 5-325 MG tablet Take 1 tablet by mouth every 6 (six) hours as needed for severe pain. (Patient not taking: Reported on 08/27/2017) 10 tablet 0 Not Taking at Unknown time  . lisinopril (PRINIVIL,ZESTRIL) 10 MG tablet Take 1.5 tablets (15 mg total) by mouth daily. (Patient taking differently: Take 20 mg by mouth daily. ) 135 tablet 1 08/30/2017 at Unknown time  . MAGNESIUM PO Take 1 tablet by mouth daily.   08/30/2017 at Unknown time  . meloxicam (MOBIC) 15 MG tablet TAKE 1 TABLET (15 MG) BY MOUTH EVERY DAY 30 tablet 1 08/30/2017 at Unknown time  . Milnacipran HCl  (SAVELLA) 25 MG TABS Take 25 mg by mouth daily.   Not Taking at Unknown time  . oxyCODONE-acetaminophen (PERCOCET) 5-325 MG tablet Take 1 tablet by mouth every 4 (four) hours as needed. 30 tablet 0 Past Week at Unknown time  . predniSONE (DELTASONE) 10 MG tablet Take 2 tablets (20 mg total) by mouth daily. (Patient not taking: Reported on 08/27/2017) 14 tablet 0 Not Taking at Unknown time  . predniSONE (DELTASONE) 10 MG tablet Take 2 tablets by mouth with food daily. (Patient not taking: Reported on 08/31/2017) 14 tablet 0 Not Taking at Unknown time  . tretinoin (RETIN-A) 0.1 % cream Apply topically at bedtime. (Patient not taking: Reported on 08/31/2017) 45 g 3 Not Taking at Unknown time  . valACYclovir (VALTREX) 1000 MG tablet TAKE TWO TABLETS ONSET OF [place] AND REPEAT 2 TABLETS IN 12 HOURS  11 08/30/2017 at Unknown time  . venlafaxine XR (EFFEXOR-XR) 75 MG 24 hr capsule Take 225 mg by mouth daily with breakfast.    08/30/2017 at Unknown time  . zolpidem (AMBIEN) 10 MG tablet Take 10 mg by mouth at bedtime.   08/30/2017 at Unknown time    Patient Stressors: Marital or family conflict  Patient Strengths: Average or above average intelligence Capable of independent living Communication skills Physical Health  Treatment Modalities: Medication Management, Group therapy, Case management,  1 to 1 session with clinician, Psychoeducation, Recreational therapy.   Physician Treatment Plan for Primary Diagnosis: Major depressive disorder, recurrent severe without psychotic features (HCC) Long Term Goal(s): Improvement in symptoms so as ready for discharge Improvement in symptoms so as ready for discharge   Short Term Goals: Ability to identify changes in lifestyle to reduce recurrence of condition will improve Ability to identify changes in lifestyle to reduce recurrence of condition will improve  Medication Management: Evaluate patient's response, side effects, and tolerance of medication  regimen.  Therapeutic Interventions: 1 to 1 sessions, Unit Group sessions and Medication administration.  Evaluation of Outcomes: Progressing  Physician Treatment Plan for Secondary Diagnosis: Principal Problem:   Major depressive disorder, recurrent severe without psychotic features (HCC) Active Problems:   Opioid use disorder, moderate, dependence (HCC)   Essential hypertension   Spinal stenosis of lumbar region with neurogenic claudication   Sedative, hypnotic or anxiolytic use disorder, severe, dependence (HCC)   Cannabis use disorder, moderate, dependence (HCC)   ADD (attention deficit disorder)  Long Term Goal(s): Improvement in symptoms so as ready for discharge Improvement in symptoms so as ready for discharge   Short Term Goals: Ability to identify changes in lifestyle to reduce recurrence of condition will improve Ability to identify changes in lifestyle to reduce recurrence of condition will improve     Medication Management: Evaluate patient's response, side effects, and tolerance of medication regimen.  Therapeutic Interventions: 1 to 1 sessions,  Unit Group sessions and Medication administration.  Evaluation of Outcomes: Progressing   RN Treatment Plan for Primary Diagnosis: Major depressive disorder, recurrent severe without psychotic features (HCC) Long Term Goal(s): Knowledge of disease and therapeutic regimen to maintain health will improve  Short Term Goals: Ability to identify and develop effective coping behaviors will improve and Compliance with prescribed medications will improve  Medication Management: RN will administer medications as ordered by provider, will assess and evaluate patient's response and provide education to patient for prescribed medication. RN will report any adverse and/or side effects to prescribing provider.  Therapeutic Interventions: 1 on 1 counseling sessions, Psychoeducation, Medication administration, Evaluate responses to  treatment, Monitor vital signs and CBGs as ordered, Perform/monitor CIWA, COWS, AIMS and Fall Risk screenings as ordered, Perform wound care treatments as ordered.  Evaluation of Outcomes: Progressing   LCSW Treatment Plan for Primary Diagnosis: Major depressive disorder, recurrent severe without psychotic features (HCC) Long Term Goal(s): Safe transition to appropriate next level of care at discharge, Engage patient in therapeutic group addressing interpersonal concerns.  Short Term Goals: Engage patient in aftercare planning with referrals and resources, Identify triggers associated with mental health/substance abuse issues and Increase skills for wellness and recovery  Therapeutic Interventions: Assess for all discharge needs, 1 to 1 time with Social worker, Explore available resources and support systems, Assess for adequacy in community support network, Educate family and significant other(s) on suicide prevention, Complete Psychosocial Assessment, Interpersonal group therapy.  Evaluation of Outcomes: Progressing   Progress in Treatment: Attending groups: No  Participating in groups: No. Taking medication as prescribed: Yes. Toleration medication: Yes. Family/Significant other contact made: No, will contact:   will call husband Patient understands diagnosis: Yes. Discussing patient identified problems/goals with staff: Yes. Medical problems stabilized or resolved: Yes. Denies suicidal/homicidal ideation: Yes. Issues/concerns per patient self-inventory: No. Other:    New problem(s) identified: No, Describe:     New Short Term/Long Term Goal(s):  Patient Goals:    Discharge Plan or Barriers:   Reason for Continuation of Hospitalization: Anxiety Depression Medication stabilization  Coordination of Aftercare  Estimated Length of Stay: 2-3 days  Recreational Therapy: Patient Stressors: Relationship Patient Goal: Patient will identify benefit of making healthy decisions  post d/c within 5 recreation therapy group sessions  Attendees: Patient:Kara [name] 09/03/2017 5:13 PM  Physician: Kristine Linea, MD 09/03/2017 5:13 PM  Nursing: Leonia Reader, RN 09/03/2017 5:13 PM  RN Care Manager: 09/03/2017 5:13 PM  Social Worker: Jake Shark, LCSW 09/03/2017 5:13 PM  Recreational Therapist: Garret Reddish, LRT 09/03/2017 5:13 PM  Other: Johny Shears, LCSWA 09/03/2017 5:13 PM  Other: Heidi Dach, LCSW 09/03/2017 5:13 PM  Other: 09/03/2017 5:13 PM    Scribe for Treatment Team: Glennon Mac, LCSW 09/03/2017 5:13 PM
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
## 2017-09-04 NOTE — [facility] Suicide Risk Assessment (Signed)
[facility] INPATIENT:  Family/Significant Other Suicide Prevention Education  Suicide Prevention Education:  Education Completed; Kara [name], Husband [phone],  (name of family member/significant other) has been identified by the patient as the family member/significant other with whom the patient will be residing, and identified as the person(s) who will aid the patient in the event of a mental health crisis (suicidal ideations/suicide attempt).  With written consent from the patient, the family member/significant other has been provided the following suicide prevention education, prior to the and/or following the discharge of the patient.  The suicide prevention education provided includes the following:  Suicide risk factors  Suicide prevention and interventions  National Suicide Hotline telephone number  [facility] assessment telephone number  [facility] Emergency Assistance 911  [facility] and/or Residential Mobile Crisis Unit telephone number  Request made of family/significant other to:  Remove weapons (e.g., guns, rifles, knives), all items previously/currently identified as safety concern.    Remove drugs/medications (over-the-counter, prescriptions, illicit drugs), all items previously/currently identified as a safety concern.  The family member/significant other verbalizes understanding of the suicide prevention education information provided.  The family member/significant other agrees to remove the items of safety concern listed above.  Cleda Daub Coltyn Hanning, LCSW 09/04/2017, 11:19 AM

## 2017-09-04 NOTE — Plan of Care (Signed)
  Problem: Coping: Goal: Ability to verbalize frustrations and anger appropriately will improve Outcome: Progressing  Patient verbalized frustrations to staff.  

## 2017-09-04 NOTE — Progress Notes (Signed)
Patient alert and oriented x 4, endorsed back pain and was medicated per standing order, affect is blunted, mood is less irritable this evening.  Patient denies SI/HI/AVH, thoughts are organized, and she is receptive to  treatment plan.15 minutes checks was maintained will continue to monitor.

## 2017-09-04 NOTE — Progress Notes (Signed)
Recreation Therapy Notes  Date: 09/03/2017  Time: 9:30 am  Location: Craft Room  Behavioral response: Appropriate   Intervention Topic: Stress  Discussion/Intervention:  Group content on today was focused on stress. The group defined stress and ways to cope with stress. Participants expressed how they know when they are stresses out and certain triggers that stress them out. Individuals described the different ways they have to cope with stress. The group stated reasons why it is important to cope with stress. Patient explained what good stress is and some examples. The group participated in the intervention "Managing Stress". Individuals were able to identify new ways to cope with stress.  Clinical Observations/Feedback:  Patient came to group late due to unknown reasons and was focused on what peers and staff had to say about stress. Individual participated in the intervention and was social with peers and staff during group. Anslee Micheletti LRT/CTRS         Nephi Savage 09/04/2017 11:38 AM

## 2017-09-04 NOTE — Progress Notes (Signed)
[facility] MD Progress Note  09/04/2017 5:35 PM Kara [name]  MRN:  [id number]  Subjective:    Kara [name] feels much more stable today. She did have beginning of a rage but was able to recognize it and took haldol with resolution of her symptoms. She has pretty good insight into her problems. She is no lonnger suicidal, hiomicidal or agitated. She met with her husband last night and maintained her cool. Tolerates medications well. Good group participation.  Principal Problem: Major depressive disorder, recurrent severe without psychotic features ([place]) Diagnosis:   Patient Active Problem List   Diagnosis Date Noted  . Major depressive disorder, recurrent severe without psychotic features ([place]) [F33.2] 09/01/2017    Priority: High  . ADD (attention deficit disorder) [F98.8] 09/02/2017  . Sedative, hypnotic or anxiolytic use disorder, severe, dependence ([place]) [F13.20] 08/31/2017  . Cannabis use disorder, moderate, dependence ([place]) [F12.20] 08/31/2017  . Decreased libido [R68.82] 03/29/2017  . Lumbar stenosis [M48.061] 02/23/2017  . 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 S/P lumbar spinal fusion [Z98.1] 01/01/2017  . Essential hypertension [I10] 07/26/2016  . DNR (do not resuscitate) discussion [Z71.89]   . Palliative care by specialist [Z51.5]   . Cervical pseudoarthrosis ([place]) [S12.9XXA] 06/21/2016  . Opioid use disorder, moderate, dependence ([place]) [F11.20] 06/14/2016  . Mitral valve prolapse [I34.1] 01/04/2016  . HNP (herniated nucleus pulposus), lumbar [M51.26] 12/10/2015  . Prediabetes [R73.03] 08/19/2015  . Overweight (BMI 25.0-29.9) [E66.3] 08/18/2015  . Chronic pain [G89.29] 08/18/2015  . Vitamin D deficiency [E55.9] 08/18/2015  . Chronic fatigue disorder [R53.82] 06/14/2015  . S/P cervical spinal fusion [Z98.1] 03/15/2015  . PTSD (post-traumatic stress disorder) [F43.10] 03/07/2014  . Suicide threat or attempt [R45.851] 03/06/2014  .  Aggressive behavior [R46.89]   . Family history of malignant neoplasm of breast [Z80.3]   . Rheumatoid arthritis ([place]) [M06.9] 12/13/2011  . Fibromyalgia [M79.7] 12/13/2011  . H/O cold sores [Z86.19] 12/13/2011   Total Time spent with patient: 20 minutes  Past Psychiatric History: depression, anxiety.  Past Medical History:  Past Medical History:  Diagnosis Date  . ADD (attention deficit disorder)    on Adderal  . Aggressive behavior of adult   . Anemia   . Anxiety   . Cellulitis of breast 11/2013   RIGHT BREAST  . Childhood asthma   . Chronic fatigue and immune dysfunction syndrome ([place])   . Chronic lower back pain   . Chronic pain    went to [facility] for pain control; stopped in 2016 " (02/23/2017)  . Cold sore   . 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 Degenerative disc disease, lumbar   . Depression    takes meds daily  . Family history of malignant neoplasm of breast   . Fibromyalgia   . History of kidney stones   . Hypertension   . Osteoporosis   . Other specified rheumatoid arthritis, right shoulder ([place]) 08/01/2011  . Pneumonia 2010?  [name] Post-nasal drip    hx of  . Pre-diabetes   . RA (rheumatoid arthritis) ([facility])    autoimmune arthritis    Past Surgical History:  Procedure Laterality Date  . ANTERIOR CERVICAL DECOMP/DISCECTOMY FUSION N/A 03/15/2015   Procedure: Cervical five-six, Cerival six-seven, Anterior Cervical Discectomy and Fusion, Allograft and Plate;  Surgeon: Marybelle Killings, MD;  Location: [place];  Service: Orthopedics;  Laterality: N/A;  . AUGMENTATION MAMMAPLASTY  2003  . BACK  SURGERY    . BLADDER SUSPENSION  2009  . BREAST IMPLANT REMOVAL Bilateral 10/2013  . CERVICAL WOUND DEBRIDEMENT N/A 07/07/2016   Procedure: IRRIGATION AND DEBRIDEMENT POSTERIOR NECK;  Surgeon: Marybelle Killings, MD;  Location: [place];  Service: Orthopedics;  Laterality: N/A;  . COLONOSCOPY    . COMBINED ABDOMINOPLASTY AND  LIPOSUCTION  2003  . INCISION AND DRAINAGE ABSCESS Right 01/16/2014   Procedure: INCISION AND DRAINAGE AND OF RIGHT BREAST ABCESS;  Surgeon: Excell Seltzer, MD;  Location: [facility];  Service: General;  Laterality: Right;  . JOINT REPLACEMENT    . LUMBAR LAMINECTOMY/DECOMPRESSION MICRODISCECTOMY N/A 12/10/2015   Procedure: Right L3-4 Hemilaminectomy, Excision of herniated nucleus pulposus;  Surgeon: Marybelle Killings, MD;  Location: [place];  Service: Orthopedics;  Laterality: N/A;  . MASS EXCISION  11/03/2011   Procedure: MINOR EXCISION OF MASS;  Surgeon: Cammie Sickle., MD;  Location: [place];  Service: Orthopedics;  Laterality: Left;  debride IP joint, cyst excision left index  . MAXIMUM ACCESS (MAS) TRANSFORAMINAL LUMBAR INTERBODY FUSION (TLIF) 2 LEVEL Right 02/23/2017  . POSTERIOR CERVICAL FUSION/FORAMINOTOMY N/A 06/21/2016   Procedure: POSTERIOR CERVICAL FUSION C5-C7 SPINOUS PROCESS WIRING;  Surgeon: Marybelle Killings, MD;  Location: [place];  Service: Orthopedics;  Laterality: N/A;  . POSTERIOR LUMBAR FUSION  07/2009; 07/03/2014   "L4-5; L5-S1"  . SHOULDER ARTHROSCOPY Right 2012  . TONSILLECTOMY  1987  . TOTAL SHOULDER ARTHROPLASTY  08/01/2011   Procedure: TOTAL SHOULDER ARTHROPLASTY;  Surgeon: Johnny Bridge, MD;  Location: [place];  Service: Orthopedics;  Laterality: Right;  Right total shoulder arthroplasty  . TUBAL LIGATION  1988   Family History:  Family History  Problem Relation Age of Onset  . Arthritis Mother   . Heart disease Mother        ?psvt  . Breast cancer Mother 21       TAH/BSO  . Cancer Mother   . Mental illness Mother   . COPD Father   . Hypertension Father   . Alcohol abuse Father   . Mental illness Father   . Heart disease Father   . Healthy Daughter   . Breast cancer Maternal Aunt 27       deceased  . Cancer Cousin 52       female; unknown primary  . Colon cancer Paternal Aunt 32       deceased at 78  . Stomach cancer Paternal Uncle 42        deceased at 44  . Alcohol abuse Brother   . Cancer Brother   . Hodgkin's lymphoma Brother   . Mental illness Brother   . HIV Brother   . Healthy Brother   . Healthy Son    Family Psychiatric  History: daughter with borderline PD Social History:  Social History   Substance and Sexual Activity  Alcohol Use Yes   Comment: 02/23/2017 "might have 1-2 drinks/month"     Social History   Substance and Sexual Activity  Drug Use Yes  . Types: Marijuana   Comment: "i smoke marijuana to avoid opiates"     Social History   Socioeconomic History  . Marital status: Married    Spouse name: Not on file  . Number of children: Not on file  . Years of education: Not on file  . Highest education level: Not on file  Occupational History  . Not on file  Social Needs  . Financial resource strain: Not on file  .  Food insecurity:    Worry: Not on file    Inability: Not on file  . Transportation needs:    Medical: Not on file    Non-medical: Not on file  Tobacco Use  . Smoking status: Former Smoker    Packs/day: 1.00    Years: 10.00    Pack years: 10.00    Types: Cigarettes    Last attempt to quit: 12/06/2005    Years since quitting: 11.7  . Smokeless tobacco: Never Used  Substance and Sexual Activity  . Alcohol use: Yes    Comment: 02/23/2017 "might have 1-2 drinks/month"  . Drug use: Yes    Types: Marijuana    Comment: "i smoke marijuana to avoid opiates"   . Sexual activity: Yes    Birth control/protection: Post-menopausal  Lifestyle  . Physical activity:    Days per week: Not on file    Minutes per session: Not on file  . Stress: Not on file  Relationships  . Social connections:    Talks on phone: Not on file    Gets together: Not on file    Attends religious service: Not on file    Active member of club or organization: Not on file    Attends meetings of clubs or organizations: Not on file    Relationship status: Not on file  Other Topics Concern  . Not on file   Social History Narrative   Married, Montanna Mcbain.    6 children.    [employer], Retired Education officer, museum.    Denies alcohol, tobacco or drug use.   Drinks caffeinated beverages. Uses herbal remedies. Take a daily vitamin.   Wears her seatbelt. Exercises greater than 3 times a week.   Smoke detector in the home, firearms in the home in a locked cabinet, feels safe in her relationships.   Additional Social History:                         Sleep: Fair  Appetite:  Fair  Current Medications: Current Facility-Administered Medications  Medication Dose Route Frequency Provider Last Rate Last Dose  . acetaminophen (TYLENOL) tablet 650 mg  650 mg Oral Q6H PRN Pucilowska, Jolanta B, MD   650 mg at 09/04/17 1440  . alum & mag hydroxide-simeth (MAALOX/MYLANTA) 200-200-20 MG/5ML suspension 30 mL  30 mL Oral Q4H PRN Pucilowska, Jolanta B, MD      . divalproex (DEPAKOTE ER) 24 hr tablet 500 mg  500 mg Oral TID Pucilowska, Jolanta B, MD   500 mg at 09/04/17 1147  . haloperidol (HALDOL) tablet 10 mg  10 mg Oral Q6H PRN Pucilowska, Jolanta B, MD   10 mg at 09/03/17 2122  . HYDROcodone-acetaminophen (NORCO/VICODIN) 5-325 MG per tablet 2 tablet  2 tablet Oral BID PRN Pucilowska, Jolanta B, MD   2 tablet at 09/04/17 0830  . lisinopril (PRINIVIL,ZESTRIL) tablet 10 mg  10 mg Oral Daily Pucilowska, Jolanta B, MD   10 mg at 09/04/17 0830  . magnesium hydroxide (MILK OF MAGNESIA) suspension 30 mL  30 mL Oral Daily PRN Pucilowska, Jolanta B, MD      . multivitamin with minerals tablet 1 tablet  1 tablet Oral Daily He, Jun, MD   1 tablet at 09/02/17 0923  . prazosin (MINIPRESS) capsule 2 mg  2 mg Oral BID Pucilowska, Jolanta B, MD   2 mg at 09/04/17 0829  . thiamine (VITAMIN B-1) tablet 100 mg  100 mg Oral Daily He, Jun,  MD   100 mg at 09/04/17 0830  . valACYclovir (VALTREX) tablet 1,000 mg  1,000 mg Oral Daily Pucilowska, Jolanta B, MD   1,000 mg at 09/04/17 0830  . venlafaxine XR (EFFEXOR-XR) 24 hr capsule 150 mg   150 mg Oral Q breakfast He, Jun, MD   150 mg at 09/04/17 0830    Lab Results: No results found for this or any previous visit (from the past 48 hour(s)).  Blood Alcohol level:  Lab Results  Component Value Date   ETH <10 08/30/2017   ETH 73 (H) 53/29/9242    Metabolic Disorder Labs: Lab Results  Component Value Date   HGBA1C 5.9 (H) 02/21/2017   MPG 122.63 02/21/2017   MPG 120 12/14/2015   No results found for: PROLACTIN Lab Results  Component Value Date   CHOL 278 (H) 08/18/2015   TRIG 182.0 (H) 08/18/2015   HDL 64.30 08/18/2015   CHOLHDL 4 08/18/2015   VLDL 36.4 08/18/2015   LDLCALC 178 (H) 08/18/2015   LDLCALC 121 (H) 08/01/2013    Physical Findings: AIMS:  , ,  ,  ,    CIWA:  CIWA-Ar Total: 0 COWS:     Musculoskeletal: Strength & Muscle Tone: within normal limits Gait & Station: normal Patient leans: N/A  Psychiatric Specialty Exam: Physical Exam  Nursing note and vitals reviewed. Psychiatric: She has a normal mood and affect. Her speech is normal and behavior is normal. Thought content normal. Cognition and memory are normal. She expresses impulsivity.    Review of Systems  Musculoskeletal: Positive for back pain.  Neurological: Negative.   Psychiatric/Behavioral: The patient is nervous/anxious.   All other systems reviewed and are negative.   Blood pressure 116/77, pulse 84, temperature 97.9 F (36.6 C), temperature source Oral, resp. rate 16, height 5' 4" (1.626 m), weight 67.1 kg (148 lb), SpO2 94 %.Body mass index is 25.4 kg/m.  General Appearance: Casual  Eye Contact:  Good  Speech:  Clear and Coherent  Volume:  Normal  Mood:  Euthymic  Affect:  Appropriate  Thought Process:  Goal Directed and Descriptions of Associations: Intact  Orientation:  Full (Time, Place, and Person)  Thought Content:  WDL  Suicidal Thoughts:  No  Homicidal Thoughts:  No  Memory:  Immediate;   Fair Recent;   Fair Remote;   Fair  Judgement:  Impaired  Insight:   Present  Psychomotor Activity:  Normal  Concentration:  Concentration: Fair and Attention Span: Fair  Recall:  [employer] of Knowledge:  Fair  Language:  Fair  Akathisia:  No  Handed:  Right  AIMS (if indicated):     Assets:  Communication Skills Desire for Improvement Financial Resources/Insurance Housing Intimacy Resilience Social Support Transportation  ADL's:  Intact  Cognition:  WNL  Sleep:  Number of Hours: 7     Treatment Plan Summary: Daily contact with patient to assess and evaluate symptoms and progress in treatment and Medication management   Kara [name] is a 62 year old female with a history of depression, anxiety and chronic pain. She has been a patient of Dr. Toy Care maintained on high dose of xanax and adderall. She is on Norco far back surgery as well. She presented to outside hospital with extreme rage over the past 3 days. This has resolved. The patient is in control of her behavior today.  #Agitation, resolved -Haldol and Benadryl are available.   # Mood instability -increase Depakote to 500 mg TID, level in am   -continue  Effexor 150 mg daily    #Insomnia, resolved  # Anxiety disorder, PTSD -discontinue Xanax and Librium, no symptoms of withdrawal -start Minipress 2 mg BID for nightmares and flashbacks  #ADD -we did not offer Adderall  #Chronic pain -awaiting back surgery on 6/13 -Norco 10 mg BID PRN pain, no Rx will be given  #Disposition -discharge to home with husband Follow up with Dr. Toy Care   Prescription Medications Dependence and Abuse:pt has been getting concurrently from Dr. Chucky May  Adderall ER 49m daily + Adderall 341mdaily + Xanax 67m38maily + Ambien 27m52mily, + frequent opioids meds from different prescribers. Per [place] Controlled substance database, she has had total of 38 prescribers for all different kind of controlled medications since 2013, and has had over 350 Rxs filled in 10 pharmacies. This pt is at high risk for  iatrogenic addictions, and likely diverting some illicit medicines as well.   # Sedative, hypnotic (Benzo) abuse -- pt agreed to stop all Benzos.  -- stopped ambien, and will provide Trazodone prn for insomnia.   # ADD -- HOLD Adderall while on the unit, as it can increase anxiety, worsening agitation and insomnia. Pt has not had adderall for 2 days, and she has NO concentration problem engaging in the interview this morning with the writProbation officer- holing adderall at least while in the hospital will allow her to sleep better, with less anxiety, and a break from adderall will likely make it more effective at a lower and safer dose when she does need help with concentration. It will also give a chance to re-evaluation whether she truly has ADD, or the attention deficits were caused by opioids and Benzo use.  -- thus, Adderall was discontinued.   # Chronic Pain -- pt has hx of opioid dependence, she was in pain management clinic for years.  -- however, she needs back surgery and has acute pain. Thus, will continue narco PRN. Will need to use with caution given her hx of opioid dependence.   # Dispo -- defer to primary team.    JolaOrson Slick 09/04/2017, 5:35 PM

## 2017-09-04 NOTE — [facility] Group Notes (Signed)
CSW Group Therapy Note  09/04/2017  Time:  0900  Type of Therapy and Topic: Group Therapy: Goals Group: SMART Goals    Participation Level:  Did Not Attend    Description of Group:   The purpose of a daily goals group is to assist and guide patients in setting recovery/wellness-related goals. The objective is to set goals as they relate to the crisis in which they were admitted. Patients will be using SMART goal modalities to set measurable goals. Characteristics of realistic goals will be discussed and patients will be assisted in setting and processing how one will reach their goal. Facilitator will also assist patients in applying interventions and coping skills learned in psycho-education groups to the SMART goal and process how one will achieve defined goal.    Therapeutic Goals:  -Patients will develop and document one goal related to or their crisis in which brought them into treatment.  -Patients will be guided by LCSW using SMART goal setting modality in how to set a measurable, attainable, realistic and time sensitive goal.  -Patients will process barriers in reaching goal.  -Patients will process interventions in how to overcome and successful in reaching goal.    Patient's Goal:   Pt was invited to attend group but chose not to attend. CSW will continue to encourage pt to attend group throughout their admission.   Therapeutic Modalities:  Motivational Interviewing  Cognitive Behavioral Therapy  Crisis Intervention Model  SMART goals setting  Heidi Dach, MSW, LCSW Clinical Social Worker 09/04/2017 9:36 AM

## 2017-09-04 NOTE — Plan of Care (Signed)
Patient pleasant and cooperative. Denies SI/HI/AVH. Verbalizes that she feels much better.  Denies any depression.  Visible in the milieu. Attending groups.  Interacting with peers and staff appropriately.  Support and encouragement offered.  Safety maintained on the unit.  Problem: Education: Goal: Knowledge of [facility] Education information/materials will improve Outcome: Progressing Goal: Emotional status will improve Outcome: Progressing Goal: Mental status will improve Outcome: Progressing Goal: Verbalization of understanding the information provided will improve Outcome: Progressing   Problem: Coping: Goal: Ability to verbalize frustrations and anger appropriately will improve Outcome: Progressing Goal: Ability to demonstrate self-control will improve Outcome: Progressing   Problem: Health Behavior/Discharge Planning: Goal: Identification of resources available to assist in meeting health care needs will improve Outcome: Progressing Goal: Compliance with treatment plan for underlying cause of condition will improve Outcome: Progressing   Problem: Safety: Goal: Periods of time without injury will increase Outcome: Progressing

## 2017-09-04 NOTE — ED Notes (Signed)
Ms Gaydos was seen by staff jumping up from bed to a standing postion and then flops to floor. EDP Molpus MD was notified  and examined pt who was found to have pre-existing  bruises and abrasions on bilateral arms elbows and forearms as well as bilateral legs and knees. All bruises and abrasion noted were in later stages of healing and had been documented by [facility] officer on arrival. No new or fresh wounds or bruises were noted on examination  by doctor which occurred less than 30 minutes after fall. Mrs Hoerner was able to stand and ambulate without assist and stated she just tripped. Sitter who was sitting at door of pt room was instructed to sit at bedside.

## 2017-09-04 NOTE — [facility] Group Notes (Signed)
09/04/2017 1PM  Type of Therapy/Topic:  Group Therapy:  Feelings about Diagnosis  Participation Level:  Active   Description of Group:   This group will allow patients to explore their thoughts and feelings about diagnoses they have received. Patients will be guided to explore their level of understanding and acceptance of these diagnoses. Facilitator will encourage patients to process their thoughts and feelings about the reactions of others to their diagnosis and will guide patients in identifying ways to discuss their diagnosis with significant others in their lives. This group will be process-oriented, with patients participating in exploration of their own experiences, giving and receiving support, and processing challenge from other group members.   Therapeutic Goals: 1. Patient will demonstrate understanding of diagnosis as evidenced by identifying two or more symptoms of the disorder 2. Patient will be able to express two feelings regarding the diagnosis 3. Patient will demonstrate their ability to communicate their needs through discussion and/or role play  Summary of Patient Progress: Actively and appropriately engaged in the group. Patient was able to provide support and validation to other group members.Patient practiced active listening when interacting with the facilitator and other group members. Samarra spoke about how she had clarity once when was diagnosed. She also brought up good points about how depression/mental health can affect anyone despite of who they are and what career they have. Patient did well in the group activity. Patient is still in the process of obtaining treatment goals.        Therapeutic Modalities:   Cognitive Behavioral Therapy Brief Therapy Feelings Identification    Johny Shears, LCSW 09/04/2017 2:11 PM

## 2017-09-04 NOTE — Progress Notes (Signed)
Patient ID: Kara [name], female   DOB: 1955/12/18, 62 y.o.   MRN: [id number]  CSW notified husband that Pt would be discharged tomorrow. He is in agreement and will wait to hear from the patient before he comes to pick her up.  Jake Shark, LCSW

## 2017-09-05 DIAGNOSIS — F4325 Adjustment disorder with mixed disturbance of emotions and conduct: Secondary | ICD-10-CM | POA: Diagnosis present

## 2017-09-05 LAB — VALPROIC ACID LEVEL: Valproic Acid Lvl: 71 ug/mL (ref 50.0–100.0)

## 2017-09-05 MED ORDER — HALOPERIDOL 10 MG PO TABS: 10.0000 mg | ORAL_TABLET | Freq: Four times a day (QID) | ORAL | 1 refills | Status: DC | PRN

## 2017-09-05 MED ORDER — PRAZOSIN HCL 2 MG PO CAPS: 2.0000 mg | ORAL_CAPSULE | Freq: Two times a day (BID) | ORAL | 1 refills | Status: DC

## 2017-09-05 MED ORDER — DIVALPROEX SODIUM ER 500 MG PO TB24: 500.0000 mg | ORAL_TABLET | Freq: Two times a day (BID) | ORAL | 1 refills | Status: DC
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
## 2017-09-05 NOTE — Progress Notes (Signed)
Recreation Therapy Notes   Date: 09/05/2017  Time: 9:30 am  Location: Craft Room  Behavioral response: Appropriate   Intervention Topic: Communication  Discussion/Intervention:  Group content today was focused on communication. The group defined communication and ways to communicate with others. Individuals stated reason why communication is important and some reasons to communicate with others. Patients expressed if they thought they were good at communicating with others and ways they could improve their communication skills. The group identified important parts of communication and some experiences they have had in the past with communication. The group participated in the intervention "What is that?", where they had a chance to test out their communication skills and identify ways to improve their communication techniques.   Clinical Observations/Feedback:  Patient came to group and defined communication as sending a message. Individual identified music,poetry and body language as different ways she communicates with others.She participated in the intervention and was social with peers and staff during group.             Kara [name] 09/05/2017 12:51 PM

## 2017-09-05 NOTE — BHH Suicide Risk Assessment (Signed)
[facility] Discharge Suicide Risk Assessment   Principal Problem: Major depressive disorder, recurrent severe without psychotic features [facility]) Discharge Diagnoses:  Patient Active Problem List   Diagnosis Date Noted  . Major depressive disorder, recurrent severe without psychotic features (HCC) [F33.2] 09/01/2017    Priority: High  . ADD (attention deficit disorder) [F98.8] 09/02/2017  . Sedative, hypnotic or anxiolytic use disorder, severe, dependence (HCC) [F13.20] 08/31/2017  . Cannabis use disorder, moderate, dependence (HCC) [F12.20] 08/31/2017  . Decreased libido [R68.82] 03/29/2017  . Lumbar stenosis [M48.061] 02/23/2017  . 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 S/P lumbar spinal fusion [Z98.1] 01/01/2017  . Essential hypertension [I10] 07/26/2016  . DNR (do not resuscitate) discussion [Z71.89]   . Palliative care by specialist [Z51.5]   . Cervical pseudoarthrosis (HCC) [S12.9XXA] 06/21/2016  . Opioid use disorder, moderate, dependence (HCC) [F11.20] 06/14/2016  . Mitral valve prolapse [I34.1] 01/04/2016  . HNP (herniated nucleus pulposus), lumbar [M51.26] 12/10/2015  . Prediabetes [R73.03] 08/19/2015  . Overweight (BMI 25.0-29.9) [E66.3] 08/18/2015  . Chronic pain [G89.29] 08/18/2015  . Vitamin D deficiency [E55.9] 08/18/2015  . Chronic fatigue disorder [R53.82] 06/14/2015  . S/P cervical spinal fusion [Z98.1] 03/15/2015  . PTSD (post-traumatic stress disorder) [F43.10] 03/07/2014  . Suicide threat or attempt [R45.851] 03/06/2014  . Aggressive behavior [R46.89]   . Family history of malignant neoplasm of breast [Z80.3]   . Rheumatoid arthritis (HCC) [M06.9] 12/13/2011  . Fibromyalgia [M79.7] 12/13/2011  . H/O cold sores [Z86.19] 12/13/2011    Total Time spent with patient: 20 minutes  Musculoskeletal: Strength & Muscle Tone: within normal limits Gait & Station: normal Patient leans: N/A  Psychiatric Specialty Exam: Review  of Systems  Musculoskeletal: Positive for back pain and myalgias.  Neurological: Negative.   Psychiatric/Behavioral: Negative.   All other systems reviewed and are negative.   Blood pressure 116/77, pulse 84, temperature 97.9 F (36.6 C), temperature source Oral, resp. rate 16, height 5\' 4"  (1.626 m), weight 67.1 kg (148 lb), SpO2 94 %.Body mass index is 25.4 kg/m.  General Appearance: Casual  Eye Contact::  Good  Speech:  Clear and Coherent409  Volume:  Normal  Mood:  Euthymic  Affect:  Appropriate  Thought Process:  Goal Directed and Descriptions of Associations: Intact  Orientation:  Full (Time, Place, and Person)  Thought Content:  WDL  Suicidal Thoughts:  No  Homicidal Thoughts:  No  Memory:  Immediate;   Fair Recent;   Fair Remote;   Fair  Judgement:  Impaired  Insight:  Present  Psychomotor Activity:  Normal  Concentration:  Fair  Recall:  [phone] of Knowledge:Fair  Language: Fair  Akathisia:  No  Handed:  Right  AIMS (if indicated):     Assets:  Communication Skills Desire for Improvement Financial Resources/Insurance Housing Intimacy Resilience Social Support Transportation Vocational/Educational  Sleep:  Number of Hours: 7  Cognition: WNL  ADL's:  Intact   Mental Status Per Nursing Assessment::   On Admission:  Self-harm thoughts  Demographic Factors:  Caucasian  Loss Factors: Loss of significant relationship and Decline in physical health  Historical Factors: Impulsivity  Risk Reduction Factors:   Sense of responsibility to family, Living with another person, especially a relative, Positive social support and Positive therapeutic relationship  Continued Clinical Symptoms:  Depression:   Impulsivity  Cognitive Features That Contribute To Risk:  None    Suicide Risk:  Minimal: No identifiable suicidal ideation.  Patients presenting with no risk factors but  with morbid ruminations; may be classified as minimal risk based on the severity  of the depressive symptoms  Follow-up Information    Milagros Evener, MD Follow up on 09/07/2017.   Specialty:  Psychiatry Why:  4:30pm hospital follow up Contact information: [address] [place] [address] [phone]           Plan Of Care/Follow-up recommendations:  Activity:  as tolerated Diet:  low sodium heart healthy Other:  keep follow up appointments  Kristine Linea, MD 09/05/2017, 12:54 AM

## 2017-09-05 NOTE — Progress Notes (Signed)
  [facility] Adult Case Management Discharge Plan :  Will you be returning to the same living situation after discharge:  Yes,  Pt will be returning to home. At discharge, do you have transportation home?: Yes,  Pt's husband will be picking her up at discharge. Do you have the ability to pay for your medications: Yes,  Pt has medical insurance and financial means to pay for her medications.  Release of information consent forms completed and in the chart;  Patient's signature needed at discharge.  Patient to Follow up at: Follow-up Information    Milagros Evener, MD Follow up on 09/07/2017.   Specialty:  Psychiatry Why:  4:30pm hospital follow up Contact information: [address] [place] [address] [phone]           Next level of care provider has access to [facility] Link:no  Safety Planning and Suicide Prevention discussed: Yes,  No safety issues noted.  Have you used any form of tobacco in the last 30 days? (Cigarettes, Smokeless Tobacco, Cigars, and/or Pipes): No  Has patient been referred to the Quitline?: N/A patient is not a smoker  Patient has been referred for addiction treatment: N/A  Alease Frame, LCSW 09/05/2017, 10:09 AM

## 2017-09-05 NOTE — Progress Notes (Signed)
Recreation Therapy Notes  INPATIENT RECREATION TR PLAN  Patient Details Name: Kara [name] MRN: [id number] DOB: 12/22/1955 Today's Date: 09/05/2017  Rec Therapy Plan Is patient appropriate for Therapeutic Recreation?: Yes Treatment times per week: at least 3 Estimated Length of Stay: 5-7 days TR Treatment/Interventions: Group participation (Comment)  Discharge Criteria Pt will be discharged from therapy if:: Discharged Treatment plan/goals/alternatives discussed and agreed upon by:: Patient/family  Discharge Summary Short term goals set: STG - Patient will identify benefit of making healthy decisions post d/c within 5 recreation therapy group sessions Short term goals met: Adequate for discharge Progress toward goals comments: Groups attended Which groups?: Stress management, Communication, Other (Comment)(Creative expressions) Reason goals not met: N/A Therapeutic equipment acquired: N/A Reason patient discharged from therapy: Discharge from hospital Pt/family agrees with progress & goals achieved: Yes Date patient discharged from therapy: 09/05/17   Yonathan Perrow 09/05/2017, 1:01 PM

## 2017-09-05 NOTE — Plan of Care (Signed)
Patient slept for Estimated Hours of 7; Precautionary checks every 15 minutes for safety maintained, room free of safety hazards, patient sustains no injury or falls during this shift.  Problem: Education: Goal: Emotional status will improve Outcome: Progressing Goal: Mental status will improve Outcome: Progressing Goal: Verbalization of understanding the information provided will improve Outcome: Progressing   Problem: Coping: Goal: Ability to demonstrate self-control will improve Outcome: Progressing   Problem: Safety: Goal: Periods of time without injury will increase Outcome: Progressing

## 2017-09-05 NOTE — Progress Notes (Signed)
Patient ID: Kara [name], female   DOB: 1955/10/15, 62 y.o.   MRN: [id number]   Discharge Note:  Patient denies SI/HI/AVH at this time. Discharge instructions, AVS, transition record, and prescriptions gone over with patient. Patient agrees to comply with medication management, follow-up visit, and outpatient therapy. Patient belongings returned to patient. Patient questions and concerns addressed and answered. Patient ambulatory off unit. Patient discharged to home with husband.

## 2017-09-05 NOTE — Progress Notes (Signed)
Patient ID: Kara [name], female   DOB: [birth year], 62 y.o.   MRN: [id number] Visible, pleasant on approach, friendly, peer to peer interaction appropriate, supportive of others, mood and affect upbeat, sociable, patient reinstated "Oh yeah, I am a Full Code, if I dropped dead right now, I want you to do everything to bring me back; the DNR is when I am on the medical unit; I don't want to be Ventilator dependent, when ut gets to that point, I want out..." Chronic lower back pain rated as 9/10, Vicodin 2 Tablets given. Denied SI/HI/AVH.

## 2017-09-07 ENCOUNTER — Ambulatory Visit (INDEPENDENT_AMBULATORY_CARE_PROVIDER_SITE_OTHER): Payer: Managed Care, Other (non HMO) | Admitting: Orthopaedic Surgery

## 2017-09-07 ENCOUNTER — Other Ambulatory Visit: Payer: Self-pay | Admitting: Sports Medicine

## 2017-09-07 DIAGNOSIS — M545 Low back pain: Principal | ICD-10-CM

## 2017-09-07 DIAGNOSIS — G8929 Other chronic pain: Secondary | ICD-10-CM

## 2017-09-10 ENCOUNTER — Ambulatory Visit: Payer: Self-pay | Admitting: Family Medicine

## 2017-09-14 ENCOUNTER — Ambulatory Visit: Payer: Self-pay | Admitting: Family Medicine

## 2017-09-18 ENCOUNTER — Ambulatory Visit: Payer: Managed Care, Other (non HMO) | Admitting: Family Medicine

## 2017-09-18 ENCOUNTER — Ambulatory Visit (INDEPENDENT_AMBULATORY_CARE_PROVIDER_SITE_OTHER): Payer: Managed Care, Other (non HMO) | Admitting: Family Medicine

## 2017-09-18 ENCOUNTER — Encounter: Payer: Self-pay | Admitting: Family Medicine

## 2017-09-18 VITALS — BP 138/89 | HR 78 | Temp 98.9°F | Resp 20 | Ht 64.0 in | Wt 149.5 lb

## 2017-09-18 DIAGNOSIS — R350 Frequency of micturition: Secondary | ICD-10-CM | POA: Diagnosis not present

## 2017-09-18 DIAGNOSIS — R300 Dysuria: Secondary | ICD-10-CM | POA: Diagnosis not present

## 2017-09-18 DIAGNOSIS — Z0289 Encounter for other administrative examinations: Secondary | ICD-10-CM

## 2017-09-18 DIAGNOSIS — Z202 Contact with and (suspected) exposure to infections with a predominantly sexual mode of transmission: Secondary | ICD-10-CM | POA: Diagnosis not present

## 2017-09-18 LAB — POC URINALSYSI DIPSTICK (AUTOMATED)
Bilirubin, UA: NEGATIVE
GLUCOSE UA: NEGATIVE
KETONES UA: POSITIVE
Nitrite, UA: NEGATIVE
Protein, UA: NEGATIVE
RBC UA: NEGATIVE
Urobilinogen, UA: 0.2 E.U./dL
pH, UA: 6 (ref 5.0–8.0)

## 2017-09-18 MED ORDER — CEPHALEXIN 500 MG PO CAPS
500.0000 mg | ORAL_CAPSULE | Freq: Three times a day (TID) | ORAL | 0 refills | Status: DC
Start: 2017-09-18 — End: 2017-12-25

## 2017-09-18 NOTE — Patient Instructions (Signed)
Keflex every 8 hours. We will call you with urine culture results.  We will also call you  With blood work results.   This week, come in to give first morning urine, future order already placed.   Hydrate!!!  Handicap form completed also.

## 2017-09-18 NOTE — Progress Notes (Signed)
Kara [name] , May 04, 1955, 62 y.o., female MRN: [id number] Patient Care Team    Relationship Specialty Notifications Start End  Natalia Leatherwood, DO PCP - General Family Medicine  06/14/15   Eldred Manges, MD Consulting Physician Orthopedic Surgery  06/14/15   Milagros Evener, MD Consulting Physician Psychiatry  06/14/15     Chief Complaint  Patient presents with  . Urinary Frequency    burning with urination     Subjective: Pt presents for an OV with complaints of dysuria of greater than 2 weeks duration.  She denies any fever, chills, nausea or vomit.  She denies lower back pain outside of her usual back pain.  She has tried nothing for the discomfort.  He also reports today she would like to be tested for sexually transmitted diseases secondary to her husband's infidelity.  She denies any symptoms today including vaginal discharge, irritation or abdominal pain.  Patient also desires a handicap playcard to be filled out for her orthopedic conditions.   Depression screen [facility] 2/9 09/18/2017 04/17/2017 08/22/2016 08/18/2015  Decreased Interest - 3 2 0  Down, Depressed, Hopeless 3 3 2  0  PHQ - 2 Score 3 6 4  0  Altered sleeping 2 0 1 -  Tired, decreased energy 3 3 2  -  Change in appetite 2 2 1  -  Feeling bad or failure about yourself  - 0 2 -  Trouble concentrating 3 3 1  -  Moving slowly or fidgety/restless - 0 1 -  Suicidal thoughts 0 0 0 -  PHQ-9 Score 13 14 12  -  Difficult doing work/chores - Not difficult at all Somewhat difficult -  Some recent data might be hidden    Allergies  Allergen Reactions  . Aspirin Swelling    Throat swells  . Bee Venom Anaphylaxis  . Ibuprofen Swelling    Throat swells  . Ativan [Lorazepam] Other (See Comments)    AGITATION CONFUSION   . Ketamine Other (See Comments)    Hallucinations  . Toradol [Ketorolac Tromethamine] Itching, Swelling and Rash  . Tramadol Hcl Itching, Swelling and Rash    Tolerates Dilaudid 06/2016.  TDD.   Social History     Tobacco Use  . Smoking status: Former Smoker    Packs/day: 1.00    Years: 10.00    Pack years: 10.00    Types: Cigarettes    Last attempt to quit: 12/06/2005    Years since quitting: 11.7  . Smokeless tobacco: Never Used  Substance Use Topics  . Alcohol use: Yes    Comment: 02/23/2017 "might have 1-2 drinks/month"   Past Medical History:  Diagnosis Date  . ADD (attention deficit disorder)    on Adderal  . Aggressive behavior of adult   . Anemia   . Anxiety   . Cellulitis of breast 11/2013   RIGHT BREAST  . Childhood asthma   . 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 Chronic lower back pain   . Chronic pain    went to [facility] for pain control; stopped in 2016 " (02/23/2017)  . Cold sore   . 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 Degenerative disc disease, lumbar   . Depression    takes meds daily  . Family history of malignant neoplasm of breast   . Fibromyalgia   . History of kidney stones   . Hypertension   . Osteoporosis   . Other  specified rheumatoid arthritis, right shoulder (HCC) 08/01/2011  . Pneumonia 2010?  [name] Post-nasal drip    hx of  . Pre-diabetes   . RA (rheumatoid arthritis) (HCC)    autoimmune arthritis   Past Surgical History:  Procedure Laterality Date  . ANTERIOR CERVICAL DECOMP/DISCECTOMY FUSION N/A 03/15/2015   Procedure: Cervical five-six, Cerival six-seven, Anterior Cervical Discectomy and Fusion, Allograft and Plate;  Surgeon: Eldred Manges, MD;  Location: [facility] OR;  Service: Orthopedics;  Laterality: N/A;  . AUGMENTATION MAMMAPLASTY  2003  . BACK SURGERY    . BLADDER SUSPENSION  2009  . BREAST IMPLANT REMOVAL Bilateral 10/2013  . CERVICAL WOUND DEBRIDEMENT N/A 07/07/2016   Procedure: IRRIGATION AND DEBRIDEMENT POSTERIOR NECK;  Surgeon: Eldred Manges, MD;  Location: [facility] OR;  Service: Orthopedics;  Laterality: N/A;  . COLONOSCOPY    . COMBINED ABDOMINOPLASTY AND LIPOSUCTION   2003  . INCISION AND DRAINAGE ABSCESS Right 01/16/2014   Procedure: INCISION AND DRAINAGE AND OF RIGHT BREAST ABCESS;  Surgeon: Glenna Fellows, MD;  Location: [facility] ORS;  Service: General;  Laterality: Right;  . JOINT REPLACEMENT    . LUMBAR LAMINECTOMY/DECOMPRESSION MICRODISCECTOMY N/A 12/10/2015   Procedure: Right L3-4 Hemilaminectomy, Excision of herniated nucleus pulposus;  Surgeon: Eldred Manges, MD;  Location: [facility] OR;  Service: Orthopedics;  Laterality: N/A;  . MASS EXCISION  11/03/2011   Procedure: MINOR EXCISION OF MASS;  Surgeon: Wyn Forster., MD;  Location: [facility];  Service: Orthopedics;  Laterality: Left;  debride IP joint, cyst excision left index  . MAXIMUM ACCESS (MAS) TRANSFORAMINAL LUMBAR INTERBODY FUSION (TLIF) 2 LEVEL Right 02/23/2017  . POSTERIOR CERVICAL FUSION/FORAMINOTOMY N/A 06/21/2016   Procedure: POSTERIOR CERVICAL FUSION C5-C7 SPINOUS PROCESS WIRING;  Surgeon: Eldred Manges, MD;  Location: [facility] OR;  Service: Orthopedics;  Laterality: N/A;  . POSTERIOR LUMBAR FUSION  07/2009; 07/03/2014   "L4-5; L5-S1"  . SHOULDER ARTHROSCOPY Right 2012  . TONSILLECTOMY  1987  . TOTAL SHOULDER ARTHROPLASTY  08/01/2011   Procedure: TOTAL SHOULDER ARTHROPLASTY;  Surgeon: Eulas Post, MD;  Location: [facility] OR;  Service: Orthopedics;  Laterality: Right;  Right total shoulder arthroplasty  . TUBAL LIGATION  1988   Family History  Problem Relation Age of Onset  . Arthritis Mother   . Heart disease Mother        ?psvt  . Breast cancer Mother 83       TAH/BSO  . Cancer Mother   . Mental illness Mother   . COPD Father   . Hypertension Father   . Alcohol abuse Father   . Mental illness Father   . Heart disease Father   . Healthy Daughter   . Breast cancer Maternal Aunt 27       deceased  . Cancer Cousin 14       female; unknown primary  . Colon cancer Paternal Aunt 45       deceased at 73  . Stomach cancer Paternal Uncle 19       deceased at 1  . Alcohol abuse  Brother   . Cancer Brother   . Hodgkin's lymphoma Brother   . Mental illness Brother   . HIV Brother   . Healthy Brother   . Healthy Son    Allergies as of 09/18/2017      Reactions   Aspirin Swelling   Throat swells   Bee Venom Anaphylaxis   Ibuprofen Swelling   Throat swells   Ativan [lorazepam] Other (See  Comments)   AGITATION CONFUSION   Ketamine Other (See Comments)   Hallucinations   Toradol [ketorolac Tromethamine] Itching, Swelling, Rash   Tramadol Hcl Itching, Swelling, Rash   Tolerates Dilaudid 06/2016.  TDD.      Medication List        Accurate as of 09/18/17  1:43 PM. Always use your most recent med list.          acetaminophen-codeine 300-30 MG tablet Commonly known as:  TYLENOL #3 Take 1 tablet by mouth every 8 (eight) hours as needed for moderate pain.   alendronate 35 MG tablet Commonly known as:  FOSAMAX Take 1 tablet (35 mg total) by mouth every 7 (seven) days. Take with a full glass of water on an empty stomach.   cholecalciferol 1000 units tablet Commonly known as:  VITAMIN D Take 1,000 Units by mouth daily.   divalproex 500 MG 24 hr tablet Commonly known as:  DEPAKOTE ER Take 1 tablet (500 mg total) by mouth 2 (two) times daily at 8 am and 10 pm.   haloperidol 10 MG tablet Commonly known as:  HALDOL Take 1 tablet (10 mg total) by mouth every 6 (six) hours as needed for agitation.   HYDROcodone-acetaminophen 5-325 MG tablet Commonly known as:  NORCO Take 1 tablet by mouth every 6 (six) hours as needed for severe pain.   lisinopril 10 MG tablet Commonly known as:  PRINIVIL,ZESTRIL Take 1.5 tablets (15 mg total) by mouth daily.   MAGNESIUM PO Take 1 tablet by mouth daily.   meloxicam 15 MG tablet Commonly known as:  MOBIC TAKE 1 TABLET (15 MG) BY MOUTH EVERY DAY   prazosin 2 MG capsule Commonly known as:  MINIPRESS Take 1 capsule (2 mg total) by mouth 2 (two) times daily.   SAVELLA 25 MG Tabs Generic drug:  Milnacipran HCl Take  25 mg by mouth daily.   valACYclovir 1000 MG tablet Commonly known as:  VALTREX TAKE TWO TABLETS ONSET OF COLDSORE AND REPEAT 2 TABLETS IN 12 HOURS   venlafaxine XR 75 MG 24 hr capsule Commonly known as:  EFFEXOR-XR Take 225 mg by mouth daily with breakfast.       All past medical history, surgical history, allergies, family history, immunizations andmedications were updated in the EMR today and reviewed under the history and medication portions of their EMR.     ROS: Negative, with the exception of above mentioned in HPI   Objective:  BP 138/89 (BP Location: Right Arm, Patient Position: Sitting, Cuff Size: Normal)   Pulse 78   Temp 98.9 F (37.2 C)   Resp 20   Ht 5\' 4"  (1.626 m)   Wt 149 lb 8 oz (67.8 kg)   SpO2 100%   BMI 25.66 kg/m  Body mass index is 25.66 kg/m. Gen: Afebrile. No acute distress. Nontoxic in appearance, well developed, well nourished.  HENT: AT. .MMM Eyes:Pupils Equal Round Reactive to light, Extraocular movements intact,  Conjunctiva without redness, discharge or icterus. CV: RRR no murmur, no edema Chest: CTAB, no wheeze or crackles. Good air movement, normal resp effort.  Abd: Soft. NTND. BS present.  No masses palpated. No rebound or guarding.  MSK: No CVA tenderness bilaterally Skin: No rashes, purpura or petechiae.  Neuro:  Normal gait. PERLA. EOMi. Alert. Oriented x3  Psych: Normal affect, dress and demeanor. Normal speech. Normal thought content and judgment.  No exam data present No results found. Results for orders placed or performed in visit on 09/18/17 (from  the past 24 hour(s))  POCT Urinalysis Dipstick (Automated)     Status: Abnormal   Collection Time: 09/18/17  1:38 PM  Result Value Ref Range   Color, UA yellow    Clarity, UA clear    Glucose, UA Negative Negative   Bilirubin, UA Negative    Ketones, UA positive    Spec Grav, UA >=1.030 (A) 1.010 - 1.025   Blood, UA negative    pH, UA 6.0 5.0 - 8.0   Protein, UA Negative  Negative   Urobilinogen, UA 0.2 0.2 or 1.0 E.U./dL   Nitrite, UA Negative    Leukocytes, UA Small (1+) (A) Negative    Assessment/Plan: Jasamine Hira is a 62 y.o. female present for OV for  Urinary frequency/dysuria/STD exposure -Positive for small leukocytes, sent for urine culture, Keflex 500 mg 3 times daily. - POCT Urinalysis Dipstick (Automated)--> urine culture - cephALEXin (KEFLEX) 500 MG capsule; Take 1 capsule (500 mg total) by mouth 3 (three) times daily.  Dispense: 21 capsule; Refill: 0 - HIV antibody (with reflex) - Hepatitis C Antibody - RPR - Urine cytology ancillary only; Future --> she will make lab appt to get collected, unable to run off specimen today - HSV(herpes smplx)abs-1+2(IgG+IgM)-bld  Handicap form completed today   Reviewed expectations re: course of current medical issues.  Discussed self-management of symptoms.  Outlined signs and symptoms indicating need for more acute intervention.  Patient verbalized understanding and all questions were answered.  Patient received an After-Visit Summary.    Orders Placed This Encounter  Procedures  . POCT Urinalysis Dipstick (Automated)   > 25 minutes spent with patient, >50% of time spent face to face counseling and coordinating care.     Note is dictated utilizing voice recognition software. Although note has been proof read prior to signing, occasional typographical errors still can be missed. If any questions arise, please do not hesitate to call for verification.   electronically signed by:  Felix Pacini, DO  [facility] - OR

## 2017-09-19 ENCOUNTER — Ambulatory Visit
Admission: RE | Admit: 2017-09-19 | Discharge: 2017-09-19 | Disposition: A | Discharge status: Home / Self Care | Payer: Managed Care, Other (non HMO) | Source: Ambulatory Visit | Attending: Sports Medicine | Admitting: Sports Medicine

## 2017-09-19 ENCOUNTER — Encounter: Payer: Self-pay | Admitting: Family Medicine

## 2017-09-19 DIAGNOSIS — G8929 Other chronic pain: Secondary | ICD-10-CM

## 2017-09-19 DIAGNOSIS — M545 Low back pain: Principal | ICD-10-CM

## 2017-09-19 MED ORDER — IOPAMIDOL (ISOVUE-M 200) INJECTION 41%
1.0000 mL | Freq: Once | INTRAMUSCULAR | Status: AC
  Administered 2017-09-19: 1 mL via EPIDURAL

## 2017-09-19 MED ORDER — METHYLPREDNISOLONE ACETATE 40 MG/ML INJ SUSP (RADIOLOG
120.0000 mg | Freq: Once | INTRAMUSCULAR | Status: AC
  Administered 2017-09-19: 120 mg via EPIDURAL

## 2017-09-19 NOTE — Discharge Instructions (Signed)

## 2017-09-20 ENCOUNTER — Other Ambulatory Visit: Payer: Self-pay

## 2017-09-20 LAB — URINE CULTURE
MICRO NUMBER:: 90759102
SPECIMEN QUALITY: ADEQUATE

## 2017-09-20 LAB — HEPATITIS C ANTIBODY
HEP C AB: NONREACTIVE
SIGNAL TO CUT-OFF: 0.01 (ref <1.00)

## 2017-09-20 LAB — HIV ANTIBODY (ROUTINE TESTING W REFLEX): HIV 1&2 Ab, 4th Generation: NONREACTIVE

## 2017-09-20 LAB — HSV(HERPES SMPLX)ABS-I+II(IGG+IGM)-BLD
HSV 1 Glycoprotein G Ab, IgG: >62.2 index — ABNORMAL HIGH (ref 0.00–0.90)
HSV 2 IgG, Type Spec: <0.91 index (ref 0.00–0.90)
HSVI/II Comb IgM: <0.91 Ratio (ref 0.00–0.90)

## 2017-09-20 LAB — RPR: RPR: NONREACTIVE

## 2017-10-19 ENCOUNTER — Other Ambulatory Visit: Payer: Self-pay

## 2017-10-19 NOTE — Patient Outreach (Signed)
[facility]) Care Management  10/19/2017  Kara [name] [birth year] [id number]    Referral date: 10/19/17 Referral source:  Request received from Allayne Butcher,  Rosann Auerbach nurse case manager to outreach patient Insurance: [employer]  Telephone call to patient.  Unable to reach. HIPAA compliant voice message left with call back phone number.   PLAN: RNCM will inform [employer] nurse case manager of inability to reach patient.   George Ina RN,BSN,CCM [facility] Telephonic  [phone]

## 2017-10-23 ENCOUNTER — Other Ambulatory Visit: Payer: Self-pay | Admitting: *Deleted

## 2017-10-23 NOTE — Patient Outreach (Addendum)
[facility]) Care Management  10/23/2017  Adlene Adduci [birth year] [id number]   Received email update from George Ina at [facility] Care Management, states she received a referral from Raye Deidre Ala) at [facility] regarding this patient on 10/19/17.  Pietro Cassis states she attempted to reach patient on 10/19/17, patient was unable to reach, update given to [place] at [place],  Pietro Cassis advised Raye to contact authorities for well check if appropriate( if patient and/ or others safety was in question), and case resolved from a [facility] Care Management standpoint.  Izetta Dakin stated that she had referred patient to [facility] and she did not feel patient or others were in any immediate danger.  Also received secure email from  Raye Deidre Ala) at [facility] regarding this patient requesting update. Case discussed with Annitta Needs Aligned RNCM at [facility], states she she has discussed case with her supervisor, will notify this RNCM when supervisor's feedback received, patient has an assigned [employer] case manger that will be outreaching to patient,  and no further [facility] Care Management patient outreach needed at this time.   Sent the following secure email update to Deidre Ala [facility]) and Annitta Needs at [place].   Hello Margaret, I received a message that you have received an update from my co-worker George Ina on 10/19/17 regarding this patient, she was unable reach,  and she gave you recommendations regarding follow up.   I have been out of the office and spoke with Annitta Needs, the [employer] Aligned RNCM on 10/12/17.  Selena Batten stated that she would follow up with you regarding this case since it had not been assigned to [facility] Care Management for transition of care follow up through the appropriate workflow process.    Thanks, Diamantina Providence H. Gardiner Barefoot, BSN, CCM [facility] Care Management [facility] Telephonic CM Phone: [phone] Fax: [phone]

## 2017-12-13 ENCOUNTER — Other Ambulatory Visit: Payer: Self-pay | Admitting: Sports Medicine

## 2017-12-13 DIAGNOSIS — M545 Low back pain: Principal | ICD-10-CM

## 2017-12-13 DIAGNOSIS — G8929 Other chronic pain: Secondary | ICD-10-CM

## 2017-12-20 ENCOUNTER — Other Ambulatory Visit: Payer: Self-pay | Admitting: *Deleted

## 2017-12-20 MED ORDER — LISINOPRIL 10 MG PO TABS: 15.0000 mg | ORAL_TABLET | Freq: Every day | ORAL | 0 refills | Status: DC

## 2017-12-24 ENCOUNTER — Ambulatory Visit
Admission: RE | Admit: 2017-12-24 | Discharge: 2017-12-24 | Disposition: A | Discharge status: Home / Self Care | Payer: Managed Care, Other (non HMO) | Source: Ambulatory Visit | Attending: Sports Medicine | Admitting: Sports Medicine

## 2017-12-24 ENCOUNTER — Other Ambulatory Visit: Payer: Self-pay | Admitting: Sports Medicine

## 2017-12-24 DIAGNOSIS — M545 Low back pain: Principal | ICD-10-CM

## 2017-12-24 DIAGNOSIS — G8929 Other chronic pain: Secondary | ICD-10-CM

## 2017-12-24 MED ORDER — IOPAMIDOL (ISOVUE-M 200) INJECTION 41%
1.0000 mL | Freq: Once | INTRAMUSCULAR | Status: AC
  Administered 2017-12-24: 1 mL via EPIDURAL

## 2017-12-24 MED ORDER — METHYLPREDNISOLONE ACETATE 40 MG/ML INJ SUSP (RADIOLOG
120.0000 mg | Freq: Once | INTRAMUSCULAR | Status: AC
  Administered 2017-12-24: 40 mg via EPIDURAL

## 2017-12-24 NOTE — Discharge Instructions (Signed)

## 2017-12-25 ENCOUNTER — Telehealth: Payer: Managed Care, Other (non HMO) | Admitting: Physician Assistant

## 2017-12-25 ENCOUNTER — Telehealth: Payer: Self-pay | Admitting: *Deleted

## 2017-12-25 DIAGNOSIS — N3000 Acute cystitis without hematuria: Secondary | ICD-10-CM

## 2017-12-25 MED ORDER — CEPHALEXIN 500 MG PO CAPS: 500.0000 mg | ORAL_CAPSULE | Freq: Two times a day (BID) | ORAL | 0 refills | Status: AC

## 2017-12-25 NOTE — Telephone Encounter (Signed)
Copied from CRM [id number]. Topic: Referral - Request >> Dec 25, 2017  3:02 PM Crist Infante wrote: Reason for CRM: pt would like referral referral to a Nutritionalist.  Pt trying to get healthy

## 2017-12-25 NOTE — Progress Notes (Signed)

## 2017-12-25 NOTE — Telephone Encounter (Signed)
Please call pt: - many insurances will not provide coverage for Nutrition referrals without certain diagnosis( such as diabetes, obesity etc). She does not have code I can see that would cover this for her.  - what is her goal, besides "get healthy"? Is trying to lose weight? If so, we can consider weight loss clinic with Dr. Dalbert Garnet as an option.  - She can also log on to [email or web address] and look into the weight loss/healthy living community resources they have available.  - I can certainly refer her to nutritionist as well, if she feels strongly about it, I just can not guarantee there will be insurance coverage and she will possibly need to pay out of pocket.

## 2017-12-27 NOTE — Telephone Encounter (Signed)
Left detailed message with information and instructions on patient voice mail per DPR 

## 2018-01-31 ENCOUNTER — Other Ambulatory Visit: Payer: Self-pay | Admitting: Family Medicine

## 2018-01-31 ENCOUNTER — Encounter: Payer: Self-pay | Admitting: *Deleted

## 2018-02-01 ENCOUNTER — Emergency Department (HOSPITAL_COMMUNITY)
Admission: EM | Admit: 2018-02-01 | Discharge: 2018-02-02 | Disposition: A | Discharge status: Home / Self Care | Payer: Managed Care, Other (non HMO) | Attending: Emergency Medicine | Admitting: Emergency Medicine

## 2018-02-01 ENCOUNTER — Encounter (HOSPITAL_COMMUNITY): Payer: Self-pay

## 2018-02-01 ENCOUNTER — Other Ambulatory Visit: Payer: Self-pay

## 2018-02-01 DIAGNOSIS — M069 Rheumatoid arthritis, unspecified: Secondary | ICD-10-CM | POA: Insufficient documentation

## 2018-02-01 DIAGNOSIS — F909 Attention-deficit hyperactivity disorder, unspecified type: Secondary | ICD-10-CM | POA: Insufficient documentation

## 2018-02-01 DIAGNOSIS — F1290 Cannabis use, unspecified, uncomplicated: Secondary | ICD-10-CM | POA: Insufficient documentation

## 2018-02-01 DIAGNOSIS — Z043 Encounter for examination and observation following other accident: Secondary | ICD-10-CM | POA: Diagnosis present

## 2018-02-01 DIAGNOSIS — F4325 Adjustment disorder with mixed disturbance of emotions and conduct: Secondary | ICD-10-CM | POA: Insufficient documentation

## 2018-02-01 DIAGNOSIS — T6594XA Toxic effect of unspecified substance, undetermined, initial encounter: Secondary | ICD-10-CM

## 2018-02-01 DIAGNOSIS — R451 Restlessness and agitation: Secondary | ICD-10-CM | POA: Diagnosis not present

## 2018-02-01 DIAGNOSIS — I10 Essential (primary) hypertension: Secondary | ICD-10-CM | POA: Diagnosis not present

## 2018-02-01 DIAGNOSIS — Z87891 Personal history of nicotine dependence: Secondary | ICD-10-CM | POA: Diagnosis not present

## 2018-02-01 DIAGNOSIS — Z79899 Other long term (current) drug therapy: Secondary | ICD-10-CM | POA: Diagnosis not present

## 2018-02-01 LAB — URINALYSIS, ROUTINE W REFLEX MICROSCOPIC
Bilirubin Urine: NEGATIVE
Glucose, UA: NEGATIVE mg/dL
KETONES UR: NEGATIVE mg/dL
Nitrite: NEGATIVE
PROTEIN: NEGATIVE mg/dL
Specific Gravity, Urine: 1.009 (ref 1.005–1.030)
pH: 6 (ref 5.0–8.0)

## 2018-02-01 LAB — COMPREHENSIVE METABOLIC PANEL
ALT: 20 U/L (ref 0–44)
ANION GAP: 8 (ref 5–15)
AST: 22 U/L (ref 15–41)
Albumin: 4.2 g/dL (ref 3.5–5.0)
Alkaline Phosphatase: 79 U/L (ref 38–126)
BILIRUBIN TOTAL: 0.4 mg/dL (ref 0.3–1.2)
BUN: 31 mg/dL — ABNORMAL HIGH (ref 8–23)
CALCIUM: 9.8 mg/dL (ref 8.9–10.3)
CO2: 26 mmol/L (ref 22–32)
Chloride: 107 mmol/L (ref 98–111)
Creatinine, Ser: 0.87 mg/dL (ref 0.44–1.00)
GFR calc non Af Amer: >60 mL/min (ref >60)
Glucose, Bld: 111 mg/dL — ABNORMAL HIGH (ref 70–99)
Potassium: 4.4 mmol/L (ref 3.5–5.1)
SODIUM: 141 mmol/L (ref 135–145)
TOTAL PROTEIN: 7.5 g/dL (ref 6.5–8.1)

## 2018-02-01 LAB — RAPID URINE DRUG SCREEN, HOSP PERFORMED
AMPHETAMINES: POSITIVE — AB
Barbiturates: NOT DETECTED
Benzodiazepines: POSITIVE — AB
Cocaine: NOT DETECTED
OPIATES: NOT DETECTED
Tetrahydrocannabinol: POSITIVE — AB

## 2018-02-01 LAB — AMMONIA: AMMONIA: 12 umol/L (ref 9–35)

## 2018-02-01 LAB — CBC WITH DIFFERENTIAL/PLATELET
Abs Immature Granulocytes: 0.12 10*3/uL — ABNORMAL HIGH (ref 0.00–0.07)
BASOS PCT: 0 %
Basophils Absolute: 0 10*3/uL (ref 0.0–0.1)
EOS PCT: 0 %
Eosinophils Absolute: 0 10*3/uL (ref 0.0–0.5)
HCT: 41.2 % (ref 36.0–46.0)
Hemoglobin: 13 g/dL (ref 12.0–15.0)
Immature Granulocytes: 1 %
Lymphocytes Relative: 9 %
Lymphs Abs: 1.2 10*3/uL (ref 0.7–4.0)
MCH: 29.3 pg (ref 26.0–34.0)
MCHC: 31.6 g/dL (ref 30.0–36.0)
MCV: 93 fL (ref 80.0–100.0)
MONO ABS: 0.9 10*3/uL (ref 0.1–1.0)
Monocytes Relative: 7 %
Neutro Abs: 10.5 10*3/uL — ABNORMAL HIGH (ref 1.7–7.7)
Neutrophils Relative %: 83 %
PLATELETS: 433 10*3/uL — AB (ref 150–400)
RBC: 4.43 MIL/uL (ref 3.87–5.11)
RDW: 13 % (ref 11.5–15.5)
WBC: 12.8 10*3/uL — AB (ref 4.0–10.5)
nRBC: 0 % (ref 0.0–0.2)

## 2018-02-01 LAB — CK: CK TOTAL: 137 U/L (ref 38–234)

## 2018-02-01 LAB — PROTIME-INR
INR: 0.97
Prothrombin Time: 12.8 seconds (ref 11.4–15.2)

## 2018-02-01 LAB — ETHANOL

## 2018-02-01 LAB — SALICYLATE LEVEL

## 2018-02-01 LAB — ACETAMINOPHEN LEVEL
Acetaminophen (Tylenol), Serum: <10 ug/mL — ABNORMAL LOW (ref 10–30)
Acetaminophen (Tylenol), Serum: <10 ug/mL — ABNORMAL LOW (ref 10–30)

## 2018-02-01 LAB — VALPROIC ACID LEVEL: Valproic Acid Lvl: <10 ug/mL — ABNORMAL LOW (ref 50.0–100.0)

## 2018-02-01 MED ORDER — DIAZEPAM 5 MG PO TABS
5.0000 mg | ORAL_TABLET | Freq: Once | ORAL | Status: AC
  Administered 2018-02-01: 5 mg via ORAL
  Filled 2018-02-01: qty 1

## 2018-02-01 MED ORDER — ACETAMINOPHEN 325 MG PO TABS
650.0000 mg | ORAL_TABLET | ORAL | Status: DC | PRN
  Filled 2018-02-01: qty 2

## 2018-02-01 MED ORDER — ZIPRASIDONE MESYLATE 20 MG IM SOLR
10.0000 mg | Freq: Once | INTRAMUSCULAR | Status: AC
  Administered 2018-02-01: 10 mg via INTRAMUSCULAR
  Filled 2018-02-01: qty 20

## 2018-02-01 MED ORDER — HALOPERIDOL LACTATE 5 MG/ML IJ SOLN
2.0000 mg | Freq: Once | INTRAMUSCULAR | Status: AC
  Administered 2018-02-01: 2 mg via INTRAVENOUS
  Filled 2018-02-01: qty 1

## 2018-02-01 MED ORDER — MIDAZOLAM HCL 2 MG/2ML IJ SOLN
5.0000 mg | Freq: Once | INTRAMUSCULAR | Status: AC
  Administered 2018-02-01: 5 mg via INTRAMUSCULAR
  Filled 2018-02-01: qty 6

## 2018-02-01 MED ORDER — MIDAZOLAM HCL 2 MG/2ML IJ SOLN
4.0000 mg | Freq: Once | INTRAMUSCULAR | Status: DC
  Filled 2018-02-01: qty 4

## 2018-02-01 NOTE — ED Notes (Signed)
Pt continues to yell and bang bedrails, admin Haldol 2 mg.

## 2018-02-01 NOTE — ED Notes (Signed)
Pt continues you to scream and shake stretcher bed rails stating "I want to be discharged". Sitter at bedside.

## 2018-02-01 NOTE — Progress Notes (Signed)
TTS consulted with Donell Sievert, PA who recommends inpt treatment. EDP Dr. Clarene Duke, MD and TCU nurse have been advised. TTS to seek placement.  Princess Bruins, MSW, LCSW Therapeutic Triage Specialist  [phone]
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
## 2018-02-01 NOTE — ED Provider Notes (Signed)
I received this patient in signout from Dr. Clarene Duke.  We were awaiting completion of lab work including 4-hour Tylenol level.  Also added CK and EKG per poison control recommendations.  All lab work and EKG were unremarkable.  Patient required Versed for sedation as she was very agitated.  TTS has evaluated the patient and psychiatry team has recommended inpatient treatment.  She has been involuntarily committed.  She will be held in the ED until bed becomes available.  She is medically clear for psychiatric treatment.   Little, Ambrose Finland, MD 02/01/18 2121

## 2018-02-01 NOTE — ED Notes (Signed)
Patient spoke with husband on the phone

## 2018-02-01 NOTE — ED Notes (Signed)
Bed: YK99 Expected date:  Expected time:  Means of arrival:  Comments: [place]

## 2018-02-01 NOTE — ED Notes (Signed)
MD at bedside. 
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
## 2018-02-01 NOTE — ED Notes (Signed)
Sitter collected UA, RN notified.

## 2018-02-01 NOTE — ED Notes (Signed)
Bed: RESA Expected date: 02/01/18 Expected time: 12:31 PM Means of arrival: Ambulance Comments:
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
## 2018-02-01 NOTE — ED Notes (Signed)
Poison Control recommendations: -Ammonia level  -Monitor for ECG changes -Supportive care until pt returns to baseline Recommendations shared with Dr. Clarene Duke

## 2018-02-02 ENCOUNTER — Emergency Department (HOSPITAL_COMMUNITY): Payer: Managed Care, Other (non HMO)

## 2018-02-02 DIAGNOSIS — R451 Restlessness and agitation: Secondary | ICD-10-CM

## 2018-02-02 DIAGNOSIS — F4325 Adjustment disorder with mixed disturbance of emotions and conduct: Secondary | ICD-10-CM

## 2018-02-02 MED ORDER — HALOPERIDOL 5 MG PO TABS
5.0000 mg | ORAL_TABLET | Freq: Three times a day (TID) | ORAL | Status: DC
  Administered 2018-02-02 (×2): 5 mg via ORAL
  Filled 2018-02-02 (×2): qty 1

## 2018-02-02 MED ORDER — MIDAZOLAM HCL 2 MG/2ML IJ SOLN
5.0000 mg | Freq: Once | INTRAMUSCULAR | Status: AC
  Administered 2018-02-02: 5 mg via INTRAVENOUS

## 2018-02-02 MED ORDER — MIDAZOLAM HCL 2 MG/2ML IJ SOLN
INTRAMUSCULAR | Status: AC
  Filled 2018-02-02: qty 2

## 2018-02-02 MED ORDER — HALOPERIDOL LACTATE 5 MG/ML IJ SOLN
5.0000 mg | Freq: Once | INTRAMUSCULAR | Status: AC
  Administered 2018-02-02: 5 mg via INTRAMUSCULAR
  Filled 2018-02-02: qty 1

## 2018-02-02 MED ORDER — HYDROXYZINE HCL 50 MG/ML IM SOLN
50.0000 mg | Freq: Once | INTRAMUSCULAR | Status: AC
  Administered 2018-02-02: 50 mg via INTRAMUSCULAR
  Filled 2018-02-02: qty 1

## 2018-02-02 MED ORDER — HYDROXYZINE HCL 50 MG/ML IM SOLN
50.0000 mg | Freq: Four times a day (QID) | INTRAMUSCULAR | Status: DC | PRN
  Administered 2018-02-02: 50 mg via INTRAMUSCULAR
  Filled 2018-02-02: qty 1
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
## 2018-02-02 NOTE — ED Notes (Signed)
Awake, disruptive, will not follow verbal requests, threatening staff. Security called. MD made aware.
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
## 2018-02-02 NOTE — ED Notes (Signed)
Patient remains agitated and screaming into pillow.

## 2018-02-02 NOTE — ED Provider Notes (Signed)
Patient has become agitated again, is pacing the floor.  She has received ziprasidone 20 mg and continues to be agitated.  She had initially responded to an intramuscular injection of midazolam, but she received a second injection of midazolam and it has not helped.  She is given a dose of haloperidol and will need to be reassessed.  She eventually went to sleep following haloperidol.  He slept through the night, but woke up in the morning and was, once again, very agitated.  She is placed on a around-the-clock regimen of haloperidol and hydroxyzine will be used for any breakthrough agitation.  CRITICAL CARE Performed by: Dione Booze Total critical care time: 60 minutes Critical care time was exclusive of separately billable procedures and treating other patients. Critical care was necessary to treat or prevent imminent or life-threatening deterioration. Critical care was time spent personally by me on the following activities: development of treatment plan with patient and/or surrogate as well as nursing, discussions with consultants, evaluation of patient's response to treatment, examination of patient, obtaining history from patient or surrogate, ordering and performing treatments and interventions, ordering and review of laboratory studies, ordering and review of radiographic studies, pulse oximetry and re-evaluation of patient's condition.   Dione Booze, MD 02/02/18 [phone]

## 2018-02-02 NOTE — ED Notes (Signed)
Claris Gower Radio broadcast assistant) advised IVC has been rescinded and patient can be discharged at this time.

## 2018-02-26 ENCOUNTER — Other Ambulatory Visit: Payer: Self-pay

## 2018-02-26 ENCOUNTER — Emergency Department (HOSPITAL_COMMUNITY)
Admission: EM | Admit: 2018-02-26 | Discharge: 2018-02-27 | Disposition: A | Discharge status: Home / Self Care | Payer: Managed Care, Other (non HMO) | Attending: Emergency Medicine | Admitting: Emergency Medicine

## 2018-02-26 DIAGNOSIS — R4689 Other symptoms and signs involving appearance and behavior: Secondary | ICD-10-CM | POA: Diagnosis present

## 2018-02-26 DIAGNOSIS — Z79899 Other long term (current) drug therapy: Secondary | ICD-10-CM | POA: Diagnosis not present

## 2018-02-26 DIAGNOSIS — I10 Essential (primary) hypertension: Secondary | ICD-10-CM | POA: Insufficient documentation

## 2018-02-26 DIAGNOSIS — Z87891 Personal history of nicotine dependence: Secondary | ICD-10-CM | POA: Insufficient documentation

## 2018-02-26 DIAGNOSIS — F329 Major depressive disorder, single episode, unspecified: Secondary | ICD-10-CM | POA: Diagnosis not present

## 2018-02-26 DIAGNOSIS — F4310 Post-traumatic stress disorder, unspecified: Secondary | ICD-10-CM | POA: Diagnosis not present

## 2018-02-26 DIAGNOSIS — F4324 Adjustment disorder with disturbance of conduct: Secondary | ICD-10-CM | POA: Diagnosis not present

## 2018-02-26 DIAGNOSIS — F4325 Adjustment disorder with mixed disturbance of emotions and conduct: Secondary | ICD-10-CM

## 2018-02-26 HISTORY — DX: Suicidal ideations: R45.851

## 2018-02-26 LAB — COMPREHENSIVE METABOLIC PANEL
ALBUMIN: 3.5 g/dL (ref 3.5–5.0)
ALT: 24 U/L (ref 0–44)
ANION GAP: 9 (ref 5–15)
AST: 24 U/L (ref 15–41)
Alkaline Phosphatase: 93 U/L (ref 38–126)
BUN: 20 mg/dL (ref 8–23)
CO2: 24 mmol/L (ref 22–32)
CREATININE: 0.76 mg/dL (ref 0.44–1.00)
Calcium: 9.2 mg/dL (ref 8.9–10.3)
Chloride: 110 mmol/L (ref 98–111)
GFR calc Af Amer: >60 mL/min (ref >60)
GFR calc non Af Amer: >60 mL/min (ref >60)
GLUCOSE: 86 mg/dL (ref 70–99)
Potassium: 3.2 mmol/L — ABNORMAL LOW (ref 3.5–5.1)
Sodium: 143 mmol/L (ref 135–145)
TOTAL PROTEIN: 6.2 g/dL — AB (ref 6.5–8.1)
Total Bilirubin: 0.7 mg/dL (ref 0.3–1.2)

## 2018-02-26 LAB — RAPID URINE DRUG SCREEN, HOSP PERFORMED
Amphetamines: NOT DETECTED
BARBITURATES: NOT DETECTED
Benzodiazepines: POSITIVE — AB
Cocaine: NOT DETECTED
Opiates: POSITIVE — AB
Tetrahydrocannabinol: POSITIVE — AB

## 2018-02-26 LAB — CBC
HEMATOCRIT: 36 % (ref 36.0–46.0)
Hemoglobin: 11.6 g/dL — ABNORMAL LOW (ref 12.0–15.0)
MCH: 29.5 pg (ref 26.0–34.0)
MCHC: 32.2 g/dL (ref 30.0–36.0)
MCV: 91.6 fL (ref 80.0–100.0)
PLATELETS: 319 10*3/uL (ref 150–400)
RBC: 3.93 MIL/uL (ref 3.87–5.11)
RDW: 13.7 % (ref 11.5–15.5)
WBC: 10.8 10*3/uL — ABNORMAL HIGH (ref 4.0–10.5)
nRBC: 0 % (ref 0.0–0.2)

## 2018-02-26 LAB — ETHANOL: Alcohol, Ethyl (B): <10 mg/dL (ref <10)

## 2018-02-26 MED ORDER — QUETIAPINE FUMARATE 300 MG PO TABS
400.0000 mg | ORAL_TABLET | Freq: Once | ORAL | Status: AC
  Administered 2018-02-26: 400 mg via ORAL
  Filled 2018-02-26: qty 1

## 2018-02-26 MED ORDER — ZIPRASIDONE MESYLATE 20 MG IM SOLR
INTRAMUSCULAR | Status: AC
  Administered 2018-02-26: 10 mg via INTRAMUSCULAR
  Filled 2018-02-26: qty 20

## 2018-02-26 MED ORDER — HALOPERIDOL 5 MG PO TABS
5.0000 mg | ORAL_TABLET | Freq: Once | ORAL | Status: AC
  Administered 2018-02-26: 5 mg via ORAL
  Filled 2018-02-26: qty 1

## 2018-02-26 MED ORDER — OLANZAPINE 10 MG PO TBDP
10.0000 mg | ORAL_TABLET | Freq: Once | ORAL | Status: DC
  Filled 2018-02-26: qty 1

## 2018-02-26 MED ORDER — STERILE WATER FOR INJECTION IJ SOLN
INTRAMUSCULAR | Status: AC
  Filled 2018-02-26: qty 10

## 2018-02-26 MED ORDER — OLANZAPINE 10 MG PO TBDP
10.0000 mg | ORAL_TABLET | Freq: Once | ORAL | Status: AC
  Administered 2018-02-26: 10 mg via ORAL
  Filled 2018-02-26: qty 1

## 2018-02-26 MED ORDER — ZIPRASIDONE MESYLATE 20 MG IM SOLR
10.0000 mg | Freq: Once | INTRAMUSCULAR | Status: AC
  Administered 2018-02-26: 10 mg via INTRAMUSCULAR

## 2018-02-26 NOTE — ED Notes (Signed)
Pt remains asleep at this time. NAD noted. Equal rise and fall of chest noted.

## 2018-02-26 NOTE — BH Assessment (Signed)
[facility] Assessment Progress Note  Clinician checked back in with pt status. Pt has just been given IM Geodon and has been IVC'd. She is currently sedated. Clinician will check back in with pt later but she will likely be assessed by the PM shift.   Johny Shock. Ladona Ridgel, MS, NCC, LPCA Counselor

## 2018-02-26 NOTE — BH Assessment (Signed)
[facility] Assessment Progress Note  TTS consult requested for pt. Pt noted to be lying on the floor, beating on her chest, and hollering to the top of her lungs at staff. She is uncooperative with staff, at this time, and will likely have to be chemically restrained as her behavior seems to be escalating. TTS assessment to be attempted at a later time, once pt has reasonably calmed. Advised pt's RN, Crystal, of plan.   Johny Shock. Ladona Ridgel, MS, NCC, LPCA Counselor

## 2018-02-26 NOTE — ED Notes (Addendum)
Unable to assess complete triage due to pts behavior. Pt is uncooperative. And pt will throw herself onto the floor. Pt getting up in staffs face, screaming at staff, Pt grabbing staffs clothing.

## 2018-02-26 NOTE — ED Notes (Addendum)
Pt beginning to wake up and beginning to scream. Pt mumbling incomprehensibly, and stating that her food is "old and poisoned"

## 2018-02-26 NOTE — ED Notes (Signed)
Bed: WA31 Expected date:  Expected time:  Means of arrival:  Comments: 

## 2018-02-26 NOTE — ED Provider Notes (Signed)
[facility]-EMERGENCY DEPT Provider Note   CSN: [id number] Arrival date & time: 02/26/18  1201     History   Chief Complaint Chief Complaint  Patient presents with  . Agitation  . Psychiatric Evaluation    HPI Kara [name] is a 62 y.o. female.  Patient with hx ADD, depression, brought by husband to ED, pt with agitation, emotional upset. Pt very difficult historian. Patient states she is fine. States husband wants her locked away so he can be with his mistress. Pt indicates she is compliant w her meds. Denies thoughts of harm to self or others. Pt was agitated w staff, refusing to stay in room/bed. On my eval, is sitting in bed, calmer, alert, cooperative. Pt with relatively little insight into current situation and poor historian, not compliant w some questions - level 5 caveat. Pt denies etoh or substance abuse.   The history is provided by the patient. The history is limited by the condition of the patient.    Past Medical History:  Diagnosis Date  . ADD (attention deficit disorder)    on Adderal  . Aggressive behavior of adult   . Anemia   . Anxiety   . Cellulitis of breast 11/2013   RIGHT BREAST  . Childhood asthma   . 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 Chronic lower back pain   . Chronic pain    went to [facility] for pain control; stopped in 2016 " (02/23/2017)  . Cold sore   . 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 Degenerative disc disease, lumbar   . Depression    takes meds daily  . Family history of malignant neoplasm of breast   . Fibromyalgia   . History of kidney stones   . Hypertension   . Osteoporosis   . Other specified rheumatoid arthritis, right shoulder (HCC) 08/01/2011  . Pneumonia 2010?  [name] Post-nasal drip    hx of  . Pre-diabetes   . RA (rheumatoid arthritis) (HCC)    autoimmune arthritis    Patient Active Problem List   Diagnosis Date Noted  .  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 ADD (attention deficit disorder) 09/02/2017  . 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 Cannabis use disorder, moderate, dependence (HCC) 08/31/2017  . Decreased libido 03/29/2017  . Lumbar stenosis 02/23/2017  . Spinal stenosis of lumbar region with neurogenic claudication 02/19/2017  . S/P lumbar spinal fusion 01/01/2017  . Essential hypertension 07/26/2016  . DNR (do not resuscitate) discussion   . Palliative care by specialist   . Cervical pseudoarthrosis (HCC) 06/21/2016  . Opioid use disorder, moderate, dependence (HCC) 06/14/2016  . Mitral valve prolapse 01/04/2016  . HNP (herniated nucleus pulposus), lumbar 12/10/2015  . Prediabetes 08/19/2015  . Overweight (BMI 25.0-29.9) 08/18/2015  . Chronic pain 08/18/2015  . Vitamin D deficiency 08/18/2015  . Chronic fatigue disorder 06/14/2015  . S/P cervical spinal fusion 03/15/2015  . PTSD (post-traumatic stress disorder) 03/07/2014  . Suicide threat or attempt 03/06/2014  . Aggressive behavior   . Family history of malignant neoplasm of breast   . Rheumatoid arthritis (HCC) 12/13/2011  . Fibromyalgia 12/13/2011  . H/O cold sores 12/13/2011    Past Surgical History:  Procedure Laterality Date  . ANTERIOR CERVICAL DECOMP/DISCECTOMY FUSION N/A 03/15/2015  Procedure: Cervical five-six, Cerival six-seven, Anterior Cervical Discectomy and Fusion, Allograft and Plate;  Surgeon: Eldred Manges, MD;  Location: [facility] OR;  Service: Orthopedics;  Laterality: N/A;  . AUGMENTATION MAMMAPLASTY  2003  . BACK SURGERY    . BLADDER SUSPENSION  2009  . BREAST IMPLANT REMOVAL Bilateral 10/2013  . CERVICAL WOUND DEBRIDEMENT N/A 07/07/2016   Procedure: IRRIGATION AND DEBRIDEMENT POSTERIOR NECK;  Surgeon: Eldred Manges, MD;  Location: [facility] OR;   Service: Orthopedics;  Laterality: N/A;  . COLONOSCOPY    . COMBINED ABDOMINOPLASTY AND LIPOSUCTION  2003  . INCISION AND DRAINAGE ABSCESS Right 01/16/2014   Procedure: INCISION AND DRAINAGE AND OF RIGHT BREAST ABCESS;  Surgeon: Glenna Fellows, MD;  Location: [facility] ORS;  Service: General;  Laterality: Right;  . JOINT REPLACEMENT    . LUMBAR LAMINECTOMY/DECOMPRESSION MICRODISCECTOMY N/A 12/10/2015   Procedure: Right L3-4 Hemilaminectomy, Excision of herniated nucleus pulposus;  Surgeon: Eldred Manges, MD;  Location: [facility] OR;  Service: Orthopedics;  Laterality: N/A;  . MASS EXCISION  11/03/2011   Procedure: MINOR EXCISION OF MASS;  Surgeon: Wyn Forster., MD;  Location: [facility];  Service: Orthopedics;  Laterality: Left;  debride IP joint, cyst excision left index  . MAXIMUM ACCESS (MAS) TRANSFORAMINAL LUMBAR INTERBODY FUSION (TLIF) 2 LEVEL Right 02/23/2017  . POSTERIOR CERVICAL FUSION/FORAMINOTOMY N/A 06/21/2016   Procedure: POSTERIOR CERVICAL FUSION C5-C7 SPINOUS PROCESS WIRING;  Surgeon: Eldred Manges, MD;  Location: [facility] OR;  Service: Orthopedics;  Laterality: N/A;  . POSTERIOR LUMBAR FUSION  07/2009; 07/03/2014   "L4-5; L5-S1"  . SHOULDER ARTHROSCOPY Right 2012  . TONSILLECTOMY  1987  . TOTAL SHOULDER ARTHROPLASTY  08/01/2011   Procedure: TOTAL SHOULDER ARTHROPLASTY;  Surgeon: Eulas Post, MD;  Location: [facility] OR;  Service: Orthopedics;  Laterality: Right;  Right total shoulder arthroplasty  . TUBAL LIGATION  1988     OB History   None      Home Medications    Prior to Admission medications   Medication Sig Start Date End Date Taking? Authorizing Provider  acetaminophen-codeine (TYLENOL #3) 300-30 MG tablet Take 1 tablet by mouth every 8 (eight) hours as needed for moderate pain. Patient not taking: Reported on 02/02/2018 08/30/17   Eldred Manges, MD  alendronate (FOSAMAX) 35 MG tablet Take 1 tablet (35 mg total) by mouth every 7 (seven) days. Take with a full glass of  water on an empty stomach. Patient not taking: Reported on 02/02/2018 05/02/17   Felix Pacini A, DO  ALPRAZolam Prudy Feeler) 1 MG tablet Take 1 mg by mouth daily. 12/10/17   [provider]  amphetamine-dextroamphetamine (ADDERALL XR) 30 MG 24 hr capsule Take 60 mg by mouth every morning. 12/24/17   [provider]  amphetamine-dextroamphetamine (ADDERALL) 30 MG tablet Take 30 mg by mouth daily. 12/24/17   [provider]  cholecalciferol (VITAMIN D) 1000 units tablet Take 1,000 Units by mouth daily.    [provider]  diclofenac sodium (VOLTAREN) 1 % GEL Apply 1 application topically daily as needed for pain. 01/13/15   [provider]  divalproex (DEPAKOTE ER) 500 MG 24 hr tablet Take 1 tablet (500 mg total) by mouth 2 (two) times daily at 8 am and 10 pm. Patient not taking: Reported on 02/02/2018 09/05/17   Pucilowska, Braulio Conte B, MD  haloperidol (HALDOL) 10 MG tablet Take 1 tablet (10 mg total) by mouth every 6 (six) hours as needed for agitation. Patient not taking: Reported on  09/18/2017 09/05/17   Pucilowska, Ellin Goodie, MD  HYDROcodone-acetaminophen (NORCO) 5-325 MG tablet Take 1 tablet by mouth every 6 (six) hours as needed for severe pain. Patient not taking: Reported on 08/27/2017 06/24/17   Doug Sou, MD  lisinopril (PRINIVIL,ZESTRIL) 10 MG tablet TAKE 1 AND 1/2 TABLETS DAILY BY MOUTH 01/31/18   Kuneff, Renee A, DO  MAGNESIUM PO Take 1 tablet by mouth daily.    [provider]  meloxicam (MOBIC) 15 MG tablet TAKE 1 TABLET (15 MG) BY MOUTH EVERY DAY 07/06/17   Kerrin Champagne, MD  prazosin (MINIPRESS) 2 MG capsule Take 1 capsule (2 mg total) by mouth 2 (two) times daily. 09/05/17   Pucilowska, Jolanta B, MD  tretinoin (RETIN-A) 0.1 % cream Apply 1 application topically at bedtime. 01/28/18   [provider]  venlafaxine XR (EFFEXOR-XR) 75 MG 24 hr capsule Take 225 mg by mouth daily with breakfast.     [provider]  zolpidem  (AMBIEN) 10 MG tablet Take 10 mg by mouth at bedtime. 01/12/18   [provider]    Family History Family History  Problem Relation Age of Onset  . Arthritis Mother   . Heart disease Mother        ?psvt  . Breast cancer Mother 38       TAH/BSO  . Cancer Mother   . Mental illness Mother   . COPD Father   . Hypertension Father   . Alcohol abuse Father   . Mental illness Father   . Heart disease Father   . Healthy Daughter   . Breast cancer Maternal Aunt 27       deceased  . Cancer Cousin 26       female; unknown primary  . Colon cancer Paternal Aunt 18       deceased at 71  . Stomach cancer Paternal Uncle 31       deceased at 78  . Alcohol abuse Brother   . Cancer Brother   . Hodgkin's lymphoma Brother   . Mental illness Brother   . HIV Brother   . Healthy Brother   . Healthy Son     Social History Social History   Tobacco Use  . Smoking status: Former Smoker    Packs/day: 1.00    Years: 10.00    Pack years: 10.00    Types: Cigarettes    Last attempt to quit: 12/06/2005    Years since quitting: 12.2  . Smokeless tobacco: Never Used  Substance Use Topics  . Alcohol use: Yes    Comment: 02/23/2017 "might have 1-2 drinks/month"  . Drug use: Yes    Types: Marijuana    Comment: "i smoke marijuana to avoid opiates"      Allergies   Aspirin; Bee venom; Ibuprofen; Ativan [lorazepam]; Ketamine; Toradol [ketorolac tromethamine]; and Tramadol hcl   Review of Systems Review of Systems  Constitutional: Negative for fever.  HENT: Negative for sore throat.   Eyes: Negative for redness.  Respiratory: Negative for cough and shortness of breath.   Cardiovascular: Negative for chest pain.  Gastrointestinal: Negative for abdominal pain and vomiting.  Genitourinary: Negative for flank pain.  Musculoskeletal: Negative for back pain and neck pain.  Skin: Negative for rash.  Neurological: Negative for headaches.  Hematological: Does not bruise/bleed easily.    Psychiatric/Behavioral: Positive for agitation. Negative for self-injury and suicidal ideas.     Physical Exam Updated Vital Signs BP (!) 149/110 (BP Location: Left Arm)  Pulse 99   Resp 18   SpO2 100%   Physical Exam  Constitutional: She appears well-developed and well-nourished. No distress.  HENT:  Mouth/Throat: Oropharynx is clear and moist.  Eyes: Conjunctivae are normal. No scleral icterus.  Neck: Neck supple. No tracheal deviation present.  Cardiovascular: Normal rate, regular rhythm, normal heart sounds and intact distal pulses. Exam reveals no gallop and no friction rub.  No murmur heard. Pulmonary/Chest: Effort normal and breath sounds normal. No respiratory distress.  Abdominal: Soft. Normal appearance and bowel sounds are normal. She exhibits no distension. There is no tenderness.  Musculoskeletal: She exhibits no edema.  Neurological: She is alert.  Speech clear/fluent. Steady gait.   Skin: Skin is warm and dry. No rash noted.  Psychiatric:  Periods of agitation, current calm, alert, cooperative. Denies SI/HI.   Nursing note and vitals reviewed.    ED Treatments / Results  Labs (all labs ordered are listed, but only abnormal results are displayed) Results for orders placed or performed during the hospital encounter of 02/01/18  Acetaminophen level  Result Value Ref Range   Acetaminophen (Tylenol), Serum <10 (L) 10 - 30 ug/mL  Comprehensive metabolic panel  Result Value Ref Range   Sodium 141 135 - 145 mmol/L   Potassium 4.4 3.5 - 5.1 mmol/L   Chloride 107 98 - 111 mmol/L   CO2 26 22 - 32 mmol/L   Glucose, Bld 111 (H) 70 - 99 mg/dL   BUN 31 (H) 8 - 23 mg/dL   Creatinine, Ser 5.80 0.44 - 1.00 mg/dL   Calcium 9.8 8.9 - 99.8 mg/dL   Total Protein 7.5 6.5 - 8.1 g/dL   Albumin 4.2 3.5 - 5.0 g/dL   AST 22 15 - 41 U/L   ALT 20 0 - 44 U/L   Alkaline Phosphatase 79 38 - 126 U/L   Total Bilirubin 0.4 0.3 - 1.2 mg/dL   GFR calc non Af Amer >60 >60 mL/min    GFR calc Af Amer >60 >60 mL/min   Anion gap 8 5 - 15  Ethanol  Result Value Ref Range   Alcohol, Ethyl (B) <10 <10 mg/dL  Salicylate level  Result Value Ref Range   Salicylate Lvl <7.0 2.8 - 30.0 mg/dL  CBC with Differential  Result Value Ref Range   WBC 12.8 (H) 4.0 - 10.5 K/uL   RBC 4.43 3.87 - 5.11 MIL/uL   Hemoglobin 13.0 12.0 - 15.0 g/dL   HCT 33.8 25.0 - 53.9 %   MCV 93.0 80.0 - 100.0 fL   MCH 29.3 26.0 - 34.0 pg   MCHC 31.6 30.0 - 36.0 g/dL   RDW 76.7 34.1 - 93.7 %   Platelets 433 (H) 150 - 400 K/uL   nRBC 0.0 0.0 - 0.2 %   Neutrophils Relative % 83 %   Neutro Abs 10.5 (H) 1.7 - 7.7 K/uL   Lymphocytes Relative 9 %   Lymphs Abs 1.2 0.7 - 4.0 K/uL   Monocytes Relative 7 %   Monocytes Absolute 0.9 0.1 - 1.0 K/uL   Eosinophils Relative 0 %   Eosinophils Absolute 0.0 0.0 - 0.5 K/uL   Basophils Relative 0 %   Basophils Absolute 0.0 0.0 - 0.1 K/uL   Immature Granulocytes 1 %   Abs Immature Granulocytes 0.12 (H) 0.00 - 0.07 K/uL  Protime-INR  Result Value Ref Range   Prothrombin Time 12.8 11.4 - 15.2 seconds   INR 0.97   Urinalysis, Routine w reflex microscopic  Result  Value Ref Range   Color, Urine YELLOW YELLOW   APPearance CLEAR CLEAR   Specific Gravity, Urine 1.009 1.005 - 1.030   pH 6.0 5.0 - 8.0   Glucose, UA NEGATIVE NEGATIVE mg/dL   Hgb urine dipstick SMALL (A) NEGATIVE   Bilirubin Urine NEGATIVE NEGATIVE   Ketones, ur NEGATIVE NEGATIVE mg/dL   Protein, ur NEGATIVE NEGATIVE mg/dL   Nitrite NEGATIVE NEGATIVE   Leukocytes, UA SMALL (A) NEGATIVE   RBC / HPF 0-5 0 - 5 RBC/hpf   WBC, UA 0-5 0 - 5 WBC/hpf   Bacteria, UA RARE (A) NONE SEEN   Squamous Epithelial / LPF 0-5 0 - 5   Mucus PRESENT    Hyaline Casts, UA PRESENT   Urine rapid drug screen (hosp performed)  Result Value Ref Range   Opiates NONE DETECTED NONE DETECTED   Cocaine NONE DETECTED NONE DETECTED   Benzodiazepines POSITIVE (A) NONE DETECTED   Amphetamines POSITIVE (A) NONE DETECTED    Tetrahydrocannabinol POSITIVE (A) NONE DETECTED   Barbiturates NONE DETECTED NONE DETECTED  Valproic acid level  Result Value Ref Range   Valproic Acid Lvl <10 (L) 50.0 - 100.0 ug/mL  Ammonia  Result Value Ref Range   Ammonia 12 9 - 35 umol/L  Acetaminophen level  Result Value Ref Range   Acetaminophen (Tylenol), Serum <10 (L) 10 - 30 ug/mL  Salicylate level  Result Value Ref Range   Salicylate Lvl <7.0 2.8 - 30.0 mg/dL  CK  Result Value Ref Range   Total CK 137 38 - 234 U/L    EKG None  Radiology No results found.  Procedures Procedures (including critical care time)  Medications Ordered in ED Medications - No data to display   Initial Impression / Assessment and Plan / ED Course  I have reviewed the triage vital signs and the nursing notes.  Pertinent labs & imaging results that were available during my care of the patient were reviewed by me and considered in my medical decision making (see chart for details).  Labs sent.   BH team consulted.  Reviewed nursing notes and prior charts for additional history.   Pt again agitated with staff, this time not re-directable, yelling/screaming, trying to slam doors.   geodon 10 mg im for symptom improvement. BH eval pending.   Labs reviewed - chem normal.  On recheck, pt does note intermittent thoughts of suicide, denies specific plan. Pt with ?delusions, stating spouse just wants to imprison her so he can have babies with others. Pt rapidly moves from one unrelated thought to next. ?psychosis.  Pt trying to leave department, then trying to get into store rooms and locked areas.   ivc papers completed. BH eval pending. dispo per [facility] teamm    Final Clinical Impressions(s) / ED Diagnoses   Final diagnoses:  None    ED Discharge Orders    None       Cathren Laine, MD 02/26/18 1414
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
## 2018-02-26 NOTE — ED Notes (Addendum)
Pt asked multiple times to stay in bed. Pt refusing. Pt lost balance and slid to the floor holding on to the bed. Pt assisted with 3 staff members back into the bed. Pt still refusing to stay in bed

## 2018-02-26 NOTE — ED Notes (Signed)
Patient beligerent and yelling at staff. Patient angry with staff, husband.Throwing pillow at staff. Does not want any visitors.

## 2018-02-26 NOTE — ED Triage Notes (Addendum)
Pt irrate and screaming at staff. Pt throwing pillows at staff. Pt walking around room and stumbling. Pt asked multiple times to stay in bed. Pt refusing to stay in the bed. Pt is a safety risk. MD made aware to come to see pt. Pt demanding to have belongings back. Pt informed that she is unable to have her belongings at this time. Pt constantly screaming and throwing things around room

## 2018-02-26 NOTE — ED Notes (Addendum)
Pt repeatedly asking about her purse. Pt informed that her purse is locked up.

## 2018-02-26 NOTE — ED Notes (Addendum)
Still unable to obtain blood at this time. Will attempt to obtain blood when pt is calmer.

## 2018-02-26 NOTE — ED Notes (Signed)
Pt refusing to allow staff to do anything with care. Charge RN and [facility] at bedside with staff. Security and PD at bedside.

## 2018-02-26 NOTE — ED Notes (Signed)
Nira Conn, NP, patient meets inpatient criteria. Selena Batten, AC, no appropriate beds. TTS to seek placement. RN informed of disposition.
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
## 2018-02-26 NOTE — ED Notes (Signed)
Pt resting in bed comfortably at this time. NAD Noted. Equal rise and fall of chest noted. Will continue to monitor. Sitter remains at bedside

## 2018-02-26 NOTE — ED Notes (Signed)
Pt still refusing to stay in bed after multiple attempts made by staff. Pt lowering self to floor using the bed. Pt will put herself in the floor and get herself back up out of floor.

## 2018-02-27 ENCOUNTER — Encounter (HOSPITAL_COMMUNITY): Payer: Self-pay

## 2018-02-27 DIAGNOSIS — F4325 Adjustment disorder with mixed disturbance of emotions and conduct: Secondary | ICD-10-CM

## 2018-02-27 MED ORDER — ARIPIPRAZOLE 5 MG PO TABS
15.0000 mg | ORAL_TABLET | Freq: Every day | ORAL | Status: DC
  Administered 2018-02-27: 15 mg via ORAL
  Filled 2018-02-27: qty 1

## 2018-02-27 MED ORDER — OXCARBAZEPINE 300 MG PO TABS
600.0000 mg | ORAL_TABLET | Freq: Two times a day (BID) | ORAL | Status: DC
  Administered 2018-02-27 (×2): 600 mg via ORAL
  Filled 2018-02-27 (×2): qty 2

## 2018-02-27 MED ORDER — PRAZOSIN HCL 1 MG PO CAPS
2.0000 mg | ORAL_CAPSULE | Freq: Two times a day (BID) | ORAL | Status: DC
  Administered 2018-02-27: 2 mg via ORAL
  Filled 2018-02-27 (×2): qty 2

## 2018-02-27 MED ORDER — VENLAFAXINE HCL ER 75 MG PO CP24
225.0000 mg | ORAL_CAPSULE | Freq: Every day | ORAL | Status: DC
  Administered 2018-02-27: 225 mg via ORAL
  Filled 2018-02-27: qty 3

## 2018-02-27 MED ORDER — ACETAMINOPHEN 325 MG PO TABS
650.0000 mg | ORAL_TABLET | Freq: Once | ORAL | Status: AC
  Administered 2018-02-27: 650 mg via ORAL
  Filled 2018-02-27: qty 2

## 2018-02-27 MED ORDER — LISINOPRIL 10 MG PO TABS
15.0000 mg | ORAL_TABLET | Freq: Every day | ORAL | Status: DC
  Administered 2018-02-27: 15 mg via ORAL
  Filled 2018-02-27: qty 2

## 2018-02-27 MED ORDER — ZOLPIDEM TARTRATE 10 MG PO TABS
10.0000 mg | ORAL_TABLET | Freq: Every day | ORAL | Status: DC
  Administered 2018-02-27: 10 mg via ORAL
  Filled 2018-02-27: qty 1

## 2018-02-27 MED ORDER — ALPRAZOLAM 1 MG PO TABS
1.0000 mg | ORAL_TABLET | Freq: Four times a day (QID) | ORAL | Status: DC | PRN
  Administered 2018-02-27 (×2): 1 mg via ORAL
  Filled 2018-02-27 (×2): qty 1

## 2018-02-27 NOTE — ED Notes (Signed)
NO RX GIVEN 

## 2018-02-27 NOTE — Discharge Instructions (Signed)
For your behavioral health needs, you are advised to continue treatment with Rupinder Kaur, MD: ° °     Rupinder Kaur, MD °     [address] °     [place], [place] [address] °     [phone] °

## 2018-02-27 NOTE — ED Notes (Signed)
PSYCH MD Provider at bedside.

## 2018-02-27 NOTE — Consult Note (Addendum)
[facility] ED Discharge  02/27/2018 12:23 PM Kara [name]  MRN:  [id number] Principal Problem: Adjustment disorder with mixed disturbance of emotions and conduct Discharge Diagnoses: Principal Problem:   Adjustment disorder with mixed disturbance of emotions and conduct Active Problems:   Aggressive behavior   PTSD (post-traumatic stress disorder)   Subjective: Pt was seen and chart reviewed with treatment team and Dr Sharma Covert.  Pt denies suicidal/homicidal ideation, denies auditory/visual hallucinations and does not appear to be responding to internal stimuli. Pt stated she lives at home with her husband and she is safe there. She stated she became upset yesterday because her husband has a mistress and he wanted her to be a part of that relationship with him. She is a frequent visitor in the emergency room, 4 times in the past 6 months, and often acts out, yelling and screaming, and will demand medications. Pt has an outpatient provider who manages her medications and she just saw that provider 2 weeks ago. Pt's UDS is positive for benzos, opiates, and THC. Per PMP Aware only Benzos are prescribed. BAL is negative. She is calm and cooperative and contracts for safety upon discharge. Pt is psychiatrically clear for discharge.  Total Time spent with patient: 30 minutes  Past Psychiatric History: As above  Past Medical History:  Past Medical History:  Diagnosis Date  . ADD (attention deficit disorder)    on Adderal  . ADD (attention deficit disorder)   . Aggressive behavior of adult   . Anemia   . Anxiety   . Cellulitis of breast 11/2013   RIGHT BREAST  . Childhood asthma   . 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 Chronic lower back pain   . Chronic pain    went to [facility] for pain control; stopped in 2016 " (02/23/2017)  . Cold sore   . 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 Degenerative disc disease, lumbar   .  Depression    takes meds daily  . Family history of malignant neoplasm of breast   . Fibromyalgia   . Fibromyalgia   . History of kidney stones   . Hypertension   . Osteoporosis   . Osteoporosis   . Other specified rheumatoid arthritis, right shoulder (HCC) 08/01/2011  . Pneumonia 2010?  [name] Post-nasal drip    hx of  . Pre-diabetes   . RA (rheumatoid arthritis) (HCC)    autoimmune arthritis  . RA (rheumatoid arthritis) (HCC)   . Suicidal intent    Past Surgical History:  Procedure Laterality Date  . ANTERIOR CERVICAL DECOMP/DISCECTOMY FUSION N/A 03/15/2015   Procedure: Cervical five-six, Cerival six-seven, Anterior Cervical Discectomy and Fusion, Allograft and Plate;  Surgeon: Eldred Manges, MD;  Location: [facility] OR;  Service: Orthopedics;  Laterality: N/A;  . AUGMENTATION MAMMAPLASTY  2003  . BACK SURGERY    . BLADDER SUSPENSION  2009  . BREAST IMPLANT REMOVAL Bilateral 10/2013  . CERVICAL WOUND DEBRIDEMENT N/A 07/07/2016   Procedure: IRRIGATION AND DEBRIDEMENT POSTERIOR NECK;  Surgeon: Eldred Manges, MD;  Location: [facility] OR;  Service: Orthopedics;  Laterality: N/A;  . COLONOSCOPY    . COMBINED ABDOMINOPLASTY AND LIPOSUCTION  2003  . INCISION AND DRAINAGE ABSCESS Right 01/16/2014   Procedure: INCISION AND DRAINAGE AND OF RIGHT BREAST ABCESS;  Surgeon: Glenna Fellows, MD;  Location: [facility] ORS;  Service: General;  Laterality: Right;  . JOINT REPLACEMENT    . LUMBAR  LAMINECTOMY/DECOMPRESSION MICRODISCECTOMY N/A 12/10/2015   Procedure: Right L3-4 Hemilaminectomy, Excision of herniated nucleus pulposus;  Surgeon: Eldred Manges, MD;  Location: [facility] OR;  Service: Orthopedics;  Laterality: N/A;  . MASS EXCISION  11/03/2011   Procedure: MINOR EXCISION OF MASS;  Surgeon: Wyn Forster., MD;  Location: [facility];  Service: Orthopedics;  Laterality: Left;  debride IP joint, cyst excision left index  . MAXIMUM ACCESS (MAS) TRANSFORAMINAL LUMBAR INTERBODY FUSION (TLIF) 2 LEVEL Right  02/23/2017  . POSTERIOR CERVICAL FUSION/FORAMINOTOMY N/A 06/21/2016   Procedure: POSTERIOR CERVICAL FUSION C5-C7 SPINOUS PROCESS WIRING;  Surgeon: Eldred Manges, MD;  Location: [facility] OR;  Service: Orthopedics;  Laterality: N/A;  . POSTERIOR LUMBAR FUSION  07/2009; 07/03/2014   "L4-5; L5-S1"  . SHOULDER ARTHROSCOPY Right 2012  . TONSILLECTOMY  1987  . TOTAL SHOULDER ARTHROPLASTY  08/01/2011   Procedure: TOTAL SHOULDER ARTHROPLASTY;  Surgeon: Eulas Post, MD;  Location: [facility] OR;  Service: Orthopedics;  Laterality: Right;  Right total shoulder arthroplasty  . TUBAL LIGATION  1988   Family History:  Family History  Problem Relation Age of Onset  . Arthritis Mother   . Heart disease Mother        ?psvt  . Breast cancer Mother 56       TAH/BSO  . Cancer Mother   . Mental illness Mother   . COPD Father   . Hypertension Father   . Alcohol abuse Father   . Mental illness Father   . Heart disease Father   . Healthy Daughter   . Breast cancer Maternal Aunt 27       deceased  . Cancer Cousin 23       female; unknown primary  . Colon cancer Paternal Aunt 53       deceased at 62  . Stomach cancer Paternal Uncle 18       deceased at 10  . Alcohol abuse Brother   . Cancer Brother   . Hodgkin's lymphoma Brother   . Mental illness Brother   . HIV Brother   . Healthy Brother   . Healthy Son    Family Psychiatric  History: As listed above. Social History:  Social History   Substance and Sexual Activity  Alcohol Use Yes   Comment: 02/23/2017 "might have 1-2 drinks/month"    Social History   Substance and Sexual Activity  Drug Use Yes  . Types: Marijuana   Comment: "i smoke marijuana to avoid opiates"    Social History   Socioeconomic History  . Marital status: Married    Spouse name: Not on file  . Number of children: Not on file  . Years of education: Not on file  . Highest education level: Not on file  Occupational History  . Not on file  Social Needs  . Financial resource  strain: Not on file  . Food insecurity:    Worry: Not on file    Inability: Not on file  . Transportation needs:    Medical: Not on file    Non-medical: Not on file  Tobacco Use  . Smoking status: Former Smoker    Packs/day: 1.00    Years: 10.00    Pack years: 10.00    Types: Cigarettes    Last attempt to quit: 12/06/2005    Years since quitting: 12.2  . Smokeless tobacco: Never Used  Substance and Sexual Activity  . Alcohol use: Yes    Comment: 02/23/2017 "might have 1-2 drinks/month"  .  Drug use: Yes    Types: Marijuana    Comment: "i smoke marijuana to avoid opiates"   . Sexual activity: Yes    Birth control/protection: Post-menopausal  Lifestyle  . Physical activity:    Days per week: Not on file    Minutes per session: Not on file  . Stress: Not on file  Relationships  . Social connections:    Talks on phone: Not on file    Gets together: Not on file    Attends religious service: Not on file    Active member of club or organization: Not on file    Attends meetings of clubs or organizations: Not on file    Relationship status: Not on file  Other Topics Concern  . Not on file  Social History Narrative   Married, Kara [name].    6 children.    [employer], Retired Child psychotherapist.    Denies alcohol, tobacco or drug use.   Drinks caffeinated beverages. Uses herbal remedies. Take a daily vitamin.   Wears her seatbelt. Exercises greater than 3 times a week.   Smoke detector in the home, firearms in the home in a locked cabinet, feels safe in her relationships.    Has this patient used any form of tobacco in the last 30 days? (Cigarettes, Smokeless Tobacco, Cigars, and/or Pipes) Prescription not provided because: Patient does not use tobacco products.  Current Medications: Current Facility-Administered Medications  Medication Dose Route Frequency Provider Last Rate Last Dose  . ALPRAZolam Prudy Feeler) tablet 1 mg  1 mg Oral QID PRN Melene Plan, DO   1 mg at 02/27/18 1039  .  ARIPiprazole (ABILIFY) tablet 15 mg  15 mg Oral QHS Melene Plan, DO   15 mg at 02/27/18 0018  . lisinopril (PRINIVIL,ZESTRIL) tablet 15 mg  15 mg Oral Daily Melene Plan, DO   15 mg at 02/27/18 1008  . OLANZapine zydis (ZYPREXA) disintegrating tablet 10 mg  10 mg Oral Once Melene Plan, DO      . Oxcarbazepine (TRILEPTAL) tablet 600 mg  600 mg Oral BID Melene Plan, DO   600 mg at 02/27/18 1011  . prazosin (MINIPRESS) capsule 2 mg  2 mg Oral BID Melene Plan, DO   2 mg at 02/27/18 1018  . venlafaxine XR (EFFEXOR-XR) 24 hr capsule 225 mg  225 mg Oral Q breakfast Melene Plan, DO   225 mg at 02/27/18 1009  . zolpidem (AMBIEN) tablet 10 mg  10 mg Oral QHS Melene Plan, DO   10 mg at 02/27/18 0018   Current Outpatient Medications  Medication Sig Dispense Refill  . ALPRAZolam (XANAX) 1 MG tablet Take 1 mg by mouth 4 (four) times daily as needed for anxiety.   0  . amphetamine-dextroamphetamine (ADDERALL XR) 30 MG 24 hr capsule Take 60 mg by mouth every morning.  0  . amphetamine-dextroamphetamine (ADDERALL) 30 MG tablet Take 30 mg by mouth daily.  0  . ARIPiprazole (ABILIFY) 15 MG tablet Take 15 mg by mouth at bedtime.  12  . lisinopril (PRINIVIL,ZESTRIL) 10 MG tablet TAKE 1 AND 1/2 TABLETS DAILY BY MOUTH (Patient taking differently: Take 15 mg by mouth daily. ) 21 tablet 0  . MAGNESIUM PO Take 1 tablet by mouth daily.    . meloxicam (MOBIC) 15 MG tablet TAKE 1 TABLET (15 MG) BY MOUTH EVERY DAY 30 tablet 1  . oxcarbazepine (TRILEPTAL) 600 MG tablet Take 600 mg by mouth 2 (two) times daily.    . prazosin (MINIPRESS)  2 MG capsule Take 1 capsule (2 mg total) by mouth 2 (two) times daily. 60 capsule 1  . tretinoin (RETIN-A) 0.1 % cream Apply 1 application topically at bedtime.  3  . venlafaxine XR (EFFEXOR-XR) 75 MG 24 hr capsule Take 225 mg by mouth daily with breakfast.     . zolpidem (AMBIEN) 10 MG tablet Take 10 mg by mouth at bedtime.  2  . acetaminophen-codeine (TYLENOL #3) 300-30 MG tablet Take 1 tablet by  mouth every 8 (eight) hours as needed for moderate pain. (Patient not taking: Reported on 02/02/2018) 20 tablet 0  . alendronate (FOSAMAX) 35 MG tablet Take 1 tablet (35 mg total) by mouth every 7 (seven) days. Take with a full glass of water on an empty stomach. (Patient not taking: Reported on 02/02/2018) 12 tablet 3  . cholecalciferol (VITAMIN D) 1000 units tablet Take 1,000 Units by mouth daily.    . diclofenac sodium (VOLTAREN) 1 % GEL Apply 1 application topically daily as needed for pain.    . divalproex (DEPAKOTE ER) 500 MG 24 hr tablet Take 1 tablet (500 mg total) by mouth 2 (two) times daily at 8 am and 10 pm. (Patient not taking: Reported on 02/02/2018) 60 tablet 1  . haloperidol (HALDOL) 10 MG tablet Take 1 tablet (10 mg total) by mouth every 6 (six) hours as needed for agitation. (Patient not taking: Reported on 09/18/2017) 30 tablet 1  . HYDROcodone-acetaminophen (NORCO) 5-325 MG tablet Take 1 tablet by mouth every 6 (six) hours as needed for severe pain. (Patient not taking: Reported on 08/27/2017) 10 tablet 0   PTA Medications:  (Not in a hospital admission)  Musculoskeletal: Strength & Muscle Tone: within normal limits Gait & Station: normal Patient leans: N/A  Psychiatric Specialty Exam: Physical Exam  Nursing note and vitals reviewed. Constitutional: She is oriented to person, place, and time. She appears well-developed and well-nourished.  HENT:  Head: Normocephalic and atraumatic.  Neck: Normal range of motion.  Respiratory: Effort normal.  Musculoskeletal: Normal range of motion.  Neurological: She is alert and oriented to person, place, and time.  Psychiatric: Her speech is normal and behavior is normal. Thought content normal. Cognition and memory are normal. She expresses impulsivity. She exhibits a depressed mood.    Review of Systems  Psychiatric/Behavioral: Positive for depression and substance abuse. Negative for hallucinations and suicidal ideas.  All other  systems reviewed and are negative.   Blood pressure 113/73, pulse (!) 108, temperature 97.9 F (36.6 C), temperature source Oral, resp. rate 19, height 5\' 4"  (1.626 m), weight 61.2 kg, SpO2 98 %.Body mass index is 23.16 kg/m.  General Appearance: Casual  Eye Contact:  Good  Speech:  Clear and Coherent and Normal Rate  Volume:  Normal  Mood:  Anxious and Depressed  Affect:  Congruent and Depressed  Thought Process:  Coherent, Goal Directed, Linear and Descriptions of Associations: Intact  Orientation:  Full (Time, Place, and Person)  Thought Content:  Logical  Suicidal Thoughts:  No  Homicidal Thoughts:  No  Memory:  Immediate;   Fair Recent;   Fair Remote;   Fair  Judgement:  Fair  Insight:  Fair  Psychomotor Activity:  Normal  Concentration:  Concentration: Fair and Attention Span: Fair  Recall:  [employer] of Knowledge:  Good  Language:  Good  Akathisia:  No  Handed:  Right  AIMS (if indicated):   N/A  Assets:  Nature conservation officer  Vocational/Educational  ADL's:  Intact  Cognition:  WNL  Sleep:   N/A     Demographic Factors:  Caucasian and Unemployed  Loss Factors: Decline in physical health  Historical Factors: Family history of mental illness or substance abuse  Risk Reduction Factors:   Sense of responsibility to family and Living with another person, especially a relative  Continued Clinical Symptoms:  Severe Anxiety and/or Agitation Alcohol/Substance Abuse/Dependencies  Cognitive Features That Contribute To Risk:  Closed-mindedness    Suicide Risk:  Minimal: No identifiable suicidal ideation.  Patients presenting with no risk factors but with morbid ruminations; may be classified as minimal risk based on the severity of the depressive symptoms    Plan Of Care/Follow-up recommendations:  Activity:  as tolerated Diet:  Heart healthy  Disposition: Adjustment  Disorder: Take all medications as prescribed by your outpatient provider. Keep all follow-up appointments as scheduled with your outpatient provider.  Do not consume alcohol or use illegal drugs while on prescription medications. Report any adverse effects from your medications to your primary care provider promptly.  In the event of recurrent symptoms or worsening symptoms, call 911, a crisis hotline, or go to the nearest emergency department for evaluation.  Laveda Abbe, NP 02/27/2018, 12:23 PM   Patient seen face-to-face for psychiatric evaluation, chart reviewed and case discussed with the physician extender and developed treatment plan. Reviewed the information documented and agree with the treatment plan.  Juanetta Beets, DO 02/27/18 5:16 PM

## 2018-02-27 NOTE — ED Notes (Signed)
PT IS AWARE SHE IS BEING DISCHARGED. PT STATES SHE IS UNABLE TO WALK. OBSERVED PT WALKING FROM BED TO DOORWAY REQUESTING TO USE PHONE WITHOUT DIFFICULTY. PT REQUESTING PAIN MEDICATION. PT HAS BEEN GIVEN AM MEDICATION AND PRN MEDICATION AS ORDERED. PT IS ENCOURAGED TO GIVE MEDICATION TIME TO HELP. WARM COMPRESS GIVEN TO ASSIST WITH COMFORT.

## 2018-02-27 NOTE — ED Notes (Signed)
PT DECLINED TO STAY FOR LUNCH. PT'S RIDE HERE FOR HER DISCHARGE

## 2018-02-27 NOTE — BH Assessment (Signed)
[facility] Assessment Progress Note  Per Juanetta Beets, DO, this pt does not require psychiatric hospitalization at this time.  Pt presents under IVC initiated by EDP Cathren Laine, MD, which Dr Sharma Covert has rescinded.  Pt is to be discharged from [facility] with recommendation to continue treatment with Milagros Evener, MD.  This has been included in pt's discharge instructions.  Pt's nurse, Morrie Sheldon, has been notified.  Doylene Canning, MA Triage Specialist [phone]

## 2018-03-06 ENCOUNTER — Other Ambulatory Visit: Payer: Self-pay | Admitting: Sports Medicine

## 2018-03-07 ENCOUNTER — Other Ambulatory Visit: Payer: Self-pay

## 2018-03-07 MED ORDER — LISINOPRIL 10 MG PO TABS: ORAL_TABLET | ORAL | 1 refills | Status: DC

## 2018-03-07 NOTE — Telephone Encounter (Signed)
Patient contacted and is going out of town 03/08/18 to [place] for a month. Appt scheduled for 04/15/2018. Medication filled for enough days until appointment.

## 2018-03-17 ENCOUNTER — Other Ambulatory Visit: Payer: Self-pay

## 2018-03-17 ENCOUNTER — Encounter (HOSPITAL_COMMUNITY): Payer: Self-pay | Admitting: Emergency Medicine

## 2018-03-17 ENCOUNTER — Emergency Department (HOSPITAL_COMMUNITY)
Admission: EM | Admit: 2018-03-17 | Discharge: 2018-03-17 | Disposition: A | Discharge status: Home / Self Care | Payer: Managed Care, Other (non HMO) | Attending: Emergency Medicine | Admitting: Emergency Medicine

## 2018-03-17 DIAGNOSIS — M5416 Radiculopathy, lumbar region: Secondary | ICD-10-CM | POA: Diagnosis not present

## 2018-03-17 DIAGNOSIS — N390 Urinary tract infection, site not specified: Secondary | ICD-10-CM | POA: Diagnosis not present

## 2018-03-17 DIAGNOSIS — M5441 Lumbago with sciatica, right side: Secondary | ICD-10-CM | POA: Diagnosis not present

## 2018-03-17 DIAGNOSIS — Z79899 Other long term (current) drug therapy: Secondary | ICD-10-CM | POA: Insufficient documentation

## 2018-03-17 DIAGNOSIS — M545 Low back pain: Secondary | ICD-10-CM | POA: Diagnosis present

## 2018-03-17 DIAGNOSIS — M069 Rheumatoid arthritis, unspecified: Secondary | ICD-10-CM | POA: Diagnosis not present

## 2018-03-17 DIAGNOSIS — R1031 Right lower quadrant pain: Secondary | ICD-10-CM | POA: Diagnosis not present

## 2018-03-17 DIAGNOSIS — F1290 Cannabis use, unspecified, uncomplicated: Secondary | ICD-10-CM | POA: Insufficient documentation

## 2018-03-17 DIAGNOSIS — F902 Attention-deficit hyperactivity disorder, combined type: Secondary | ICD-10-CM | POA: Insufficient documentation

## 2018-03-17 DIAGNOSIS — G8929 Other chronic pain: Secondary | ICD-10-CM | POA: Diagnosis not present

## 2018-03-17 DIAGNOSIS — Z87891 Personal history of nicotine dependence: Secondary | ICD-10-CM | POA: Insufficient documentation

## 2018-03-17 LAB — URINALYSIS, ROUTINE W REFLEX MICROSCOPIC
Bilirubin Urine: NEGATIVE
Glucose, UA: NEGATIVE mg/dL
Ketones, ur: NEGATIVE mg/dL
Nitrite: POSITIVE — AB
Protein, ur: NEGATIVE mg/dL
Specific Gravity, Urine: 1.015 (ref 1.005–1.030)
pH: 5.5 (ref 5.0–8.0)

## 2018-03-17 LAB — RAPID URINE DRUG SCREEN, HOSP PERFORMED
AMPHETAMINES: POSITIVE — AB
Barbiturates: NOT DETECTED
Benzodiazepines: POSITIVE — AB
Cocaine: NOT DETECTED
Opiates: POSITIVE — AB
Tetrahydrocannabinol: POSITIVE — AB

## 2018-03-17 LAB — URINALYSIS, MICROSCOPIC (REFLEX)

## 2018-03-17 MED ORDER — OXYCODONE-ACETAMINOPHEN 5-325 MG PO TABS
2.0000 | ORAL_TABLET | Freq: Once | ORAL | Status: AC
  Administered 2018-03-17: 2 via ORAL
  Filled 2018-03-17: qty 2

## 2018-03-17 MED ORDER — OXYCODONE-ACETAMINOPHEN 5-325 MG PO TABS: 1.0000 | ORAL_TABLET | Freq: Four times a day (QID) | ORAL | 0 refills | Status: DC | PRN

## 2018-03-17 MED ORDER — METHOCARBAMOL 500 MG PO TABS: 500.0000 mg | ORAL_TABLET | Freq: Two times a day (BID) | ORAL | 0 refills | Status: DC

## 2018-03-17 NOTE — ED Triage Notes (Signed)
C/o R groin pain x 1 month with dysuria.  Also reports bulging disc L2-L3 that was diagnosed in [facility] 12/20.

## 2018-03-17 NOTE — ED Notes (Signed)
Patient verbalizes understanding of discharge instructions. Opportunity for questioning and answers were provided. Armband removed by staff, pt discharged from ED.  

## 2018-03-17 NOTE — Discharge Instructions (Addendum)
Take medications as prescribed. Follow up with Dr. Danielle Dess for further management of lower back pain.

## 2018-03-17 NOTE — ED Provider Notes (Signed)
[facility] EMERGENCY DEPARTMENT Provider Note   CSN: [id number] Arrival date & time: 03/17/18  1529     History   Chief Complaint Chief Complaint  Patient presents with  . Groin Pain    HPI Kara [name] is a 62 y.o. female.  Patient with history of ADD, chronic pain, RA, fibromyalgia, HTN presents with complaint of low back and right groin pain x months. The pain goes into her right leg with weakness that causes her to fall. Last fall 3 weeks ago.  Initially followed by Dr. Ophelia Charter who discharged her from his practice after becoming aggressive when not given narcotic pain relievers, now followed by Dr. Farris Has. Last seen by Dr. Farris Has on 12/9 when an MRI of both lumbar spine and pelvis were ordered. These studies were done on 12/13. That day she left for a multi[place]country [place] trip that she states she cut short due to pain, despite being seen in [place] in [place]. She denies urinary incontinence but feels she has a UTI. She complains of constipation. She presents today to the ER for pain management. She states she was followed by Pain Management in the past but this stopped in 2016.  The history is provided by the patient and the spouse. No language interpreter was used.  Groin Pain     Past Medical History:  Diagnosis Date  . ADD (attention deficit disorder)    on Adderal  . ADD (attention deficit disorder)   . Aggressive behavior of adult   . Anemia   . Anxiety   . Cellulitis of breast 11/2013   RIGHT BREAST  . Childhood asthma   . 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 Chronic lower back pain   . Chronic pain    went to [facility] for pain control; stopped in 2016 " (02/23/2017)  . Cold sore   . 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 Degenerative disc disease, lumbar   . Depression    takes meds daily  . Family history of malignant neoplasm of breast   . Fibromyalgia     . Fibromyalgia   . History of kidney stones   . Hypertension   . Osteoporosis   . Osteoporosis   . Other specified rheumatoid arthritis, right shoulder (HCC) 08/01/2011  . Pneumonia 2010?  [name] Post-nasal drip    hx of  . Pre-diabetes   . RA (rheumatoid arthritis) (HCC)    autoimmune arthritis  . RA (rheumatoid arthritis) (HCC)   . Suicidal intent     Patient Active Problem List   Diagnosis Date Noted  . 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 ADD (attention deficit disorder) 09/02/2017  . 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 Cannabis use disorder, moderate, dependence (HCC) 08/31/2017  . Decreased libido 03/29/2017  . Lumbar stenosis 02/23/2017  . Spinal stenosis of lumbar region with neurogenic claudication 02/19/2017  . S/P lumbar spinal fusion 01/01/2017  . Essential hypertension 07/26/2016  . DNR (do not resuscitate) discussion   . Palliative care by specialist   . Cervical pseudoarthrosis (HCC) 06/21/2016  . Opioid use disorder, moderate, dependence (HCC) 06/14/2016  . Mitral valve prolapse 01/04/2016  . HNP (herniated nucleus pulposus), lumbar 12/10/2015  . Prediabetes 08/19/2015  . Overweight (BMI 25.0-29.9) 08/18/2015  .  Chronic pain 08/18/2015  . Vitamin D deficiency 08/18/2015  . Chronic fatigue disorder 06/14/2015  . S/P cervical spinal fusion 03/15/2015  . PTSD (post-traumatic stress disorder) 03/07/2014  . Suicide threat or attempt 03/06/2014  . Aggressive behavior   . Family history of malignant neoplasm of breast   . Rheumatoid arthritis (HCC) 12/13/2011  . Fibromyalgia 12/13/2011  . H/O cold sores 12/13/2011    Past Surgical History:  Procedure Laterality Date  . ANTERIOR CERVICAL DECOMP/DISCECTOMY FUSION N/A 03/15/2015   Procedure: Cervical five-six, Cerival six-seven,  Anterior Cervical Discectomy and Fusion, Allograft and Plate;  Surgeon: Eldred Manges, MD;  Location: [facility] OR;  Service: Orthopedics;  Laterality: N/A;  . AUGMENTATION MAMMAPLASTY  2003  . BACK SURGERY    . BLADDER SUSPENSION  2009  . BREAST IMPLANT REMOVAL Bilateral 10/2013  . CERVICAL WOUND DEBRIDEMENT N/A 07/07/2016   Procedure: IRRIGATION AND DEBRIDEMENT POSTERIOR NECK;  Surgeon: Eldred Manges, MD;  Location: [facility] OR;  Service: Orthopedics;  Laterality: N/A;  . COLONOSCOPY    . COMBINED ABDOMINOPLASTY AND LIPOSUCTION  2003  . INCISION AND DRAINAGE ABSCESS Right 01/16/2014   Procedure: INCISION AND DRAINAGE AND OF RIGHT BREAST ABCESS;  Surgeon: Glenna Fellows, MD;  Location: [facility];  Service: General;  Laterality: Right;  . JOINT REPLACEMENT    . LUMBAR LAMINECTOMY/DECOMPRESSION MICRODISCECTOMY N/A 12/10/2015   Procedure: Right L3-4 Hemilaminectomy, Excision of herniated nucleus pulposus;  Surgeon: Eldred Manges, MD;  Location: [facility];  Service: Orthopedics;  Laterality: N/A;  . MASS EXCISION  11/03/2011   Procedure: MINOR EXCISION OF MASS;  Surgeon: Wyn Forster., MD;  Location: [facility];  Service: Orthopedics;  Laterality: Left;  debride IP joint, cyst excision left index  . MAXIMUM ACCESS (MAS) TRANSFORAMINAL LUMBAR INTERBODY FUSION (TLIF) 2 LEVEL Right 02/23/2017  . POSTERIOR CERVICAL FUSION/FORAMINOTOMY N/A 06/21/2016   Procedure: POSTERIOR CERVICAL FUSION C5-C7 SPINOUS PROCESS WIRING;  Surgeon: Eldred Manges, MD;  Location: [facility] OR;  Service: Orthopedics;  Laterality: N/A;  . POSTERIOR LUMBAR FUSION  07/2009; 07/03/2014   "L4-5; L5-S1"  . SHOULDER ARTHROSCOPY Right 2012  . TONSILLECTOMY  1987  . TOTAL SHOULDER ARTHROPLASTY  08/01/2011   Procedure: TOTAL SHOULDER ARTHROPLASTY;  Surgeon: Eulas Post, MD;  Location: [facility] OR;  Service: Orthopedics;  Laterality: Right;  Right total shoulder arthroplasty  . TUBAL LIGATION  1988     OB History   No obstetric history on file.       Home Medications    Prior to Admission medications   Medication Sig Start Date End Date Taking? Authorizing Provider  acetaminophen-codeine (TYLENOL #3) 300-30 MG tablet Take 1 tablet by mouth every 8 (eight) hours as needed for moderate pain. Patient not taking: Reported on 02/02/2018 08/30/17   Eldred Manges, MD  alendronate (FOSAMAX) 35 MG tablet Take 1 tablet (35 mg total) by mouth every 7 (seven) days. Take with a full glass of water on an empty stomach. Patient not taking: Reported on 02/02/2018 05/02/17   Felix Pacini A, DO  ALPRAZolam Prudy Feeler) 1 MG tablet Take 1 mg by mouth 4 (four) times daily as needed for anxiety.  12/10/17   [provider]  amphetamine-dextroamphetamine (ADDERALL XR) 30 MG 24 hr capsule Take 60 mg by mouth every morning. 12/24/17   [provider]  amphetamine-dextroamphetamine (ADDERALL) 30 MG tablet Take 30 mg by mouth daily. 12/24/17   [provider]  ARIPiprazole (ABILIFY) 15 MG tablet Take 15 mg  by mouth at bedtime. 02/02/18   [provider]  cholecalciferol (VITAMIN D) 1000 units tablet Take 1,000 Units by mouth daily.    [provider]  diclofenac sodium (VOLTAREN) 1 % GEL Apply 1 application topically daily as needed for pain. 01/13/15   [provider]  divalproex (DEPAKOTE ER) 500 MG 24 hr tablet Take 1 tablet (500 mg total) by mouth 2 (two) times daily at 8 am and 10 pm. Patient not taking: Reported on 02/02/2018 09/05/17   Pucilowska, Braulio Conte B, MD  haloperidol (HALDOL) 10 MG tablet Take 1 tablet (10 mg total) by mouth every 6 (six) hours as needed for agitation. Patient not taking: Reported on 09/18/2017 09/05/17   Pucilowska, Ellin Goodie, MD  HYDROcodone-acetaminophen (NORCO) 5-325 MG tablet Take 1 tablet by mouth every 6 (six) hours as needed for severe pain. Patient not taking: Reported on 08/27/2017 06/24/17   Doug Sou, MD  lisinopril (PRINIVIL,ZESTRIL) 10 MG tablet TAKE 1 AND 1/2 TABLETS DAILY BY  MOUTH 03/07/18   Kuneff, Renee A, DO  MAGNESIUM PO Take 1 tablet by mouth daily.    [provider]  meloxicam (MOBIC) 15 MG tablet TAKE 1 TABLET (15 MG) BY MOUTH EVERY DAY 07/06/17   Kerrin Champagne, MD  oxcarbazepine (TRILEPTAL) 600 MG tablet Take 600 mg by mouth 2 (two) times daily. 02/15/18   [provider]  prazosin (MINIPRESS) 2 MG capsule Take 1 capsule (2 mg total) by mouth 2 (two) times daily. 09/05/17   Pucilowska, Jolanta B, MD  tretinoin (RETIN-A) 0.1 % cream Apply 1 application topically at bedtime. 01/28/18   [provider]  venlafaxine XR (EFFEXOR-XR) 75 MG 24 hr capsule Take 225 mg by mouth daily with breakfast.     [provider]  zolpidem (AMBIEN) 10 MG tablet Take 10 mg by mouth at bedtime. 01/12/18   [provider]    Family History Family History  Problem Relation Age of Onset  . Arthritis Mother   . Heart disease Mother        ?psvt  . Breast cancer Mother 98       TAH/BSO  . Cancer Mother   . Mental illness Mother   . COPD Father   . Hypertension Father   . Alcohol abuse Father   . Mental illness Father   . Heart disease Father   . Healthy Daughter   . Breast cancer Maternal Aunt 27       deceased  . Cancer Cousin 58       female; unknown primary  . Colon cancer Paternal Aunt 74       deceased at 70  . Stomach cancer Paternal Uncle 32       deceased at 82  . Alcohol abuse Brother   . Cancer Brother   . Hodgkin's lymphoma Brother   . Mental illness Brother   . HIV Brother   . Healthy Brother   . Healthy Son     Social History Social History   Tobacco Use  . Smoking status: Former Smoker    Packs/day: 1.00    Years: 10.00    Pack years: 10.00    Types: Cigarettes    Last attempt to quit: 12/06/2005    Years since quitting: 12.2  . Smokeless tobacco: Never Used  Substance Use Topics  . Alcohol use: Yes    Comment: 02/23/2017 "might have 1-2 drinks/month"  . Drug use: Yes    Types: Marijuana  Comment: "i smoke marijuana to avoid opiates"      Allergies   Aspirin; Bee venom; Ibuprofen; Ativan [lorazepam]; Ketamine; Toradol [ketorolac tromethamine]; and Tramadol hcl   Review of Systems Review of Systems  Constitutional: Negative for chills and fever.  HENT: Negative.   Respiratory: Negative.   Cardiovascular: Negative.   Gastrointestinal: Positive for constipation. Negative for vomiting.  Genitourinary: Positive for pelvic pain.  Musculoskeletal: Positive for back pain.  Skin: Negative.   Neurological: Positive for weakness. Negative for numbness.     Physical Exam Updated Vital Signs BP 124/90 (BP Location: Right Arm)   Pulse 80   Temp 98.4 F (36.9 C) (Oral)   Resp 18   Ht 5\' 4"  (1.626 m)   Wt 61.2 kg   BMI 23.16 kg/m   Physical Exam Constitutional:      Appearance: She is well-developed.  HENT:     Head: Normocephalic.  Neck:     Musculoskeletal: Normal range of motion and neck supple.  Cardiovascular:     Rate and Rhythm: Normal rate and regular rhythm.  Pulmonary:     Effort: Pulmonary effort is normal.     Breath sounds: Normal breath sounds.  Chest:     Chest wall: No tenderness.  Abdominal:     General: Bowel sounds are normal.     Palpations: Abdomen is soft.     Tenderness: There is no abdominal tenderness. There is no guarding or rebound.  Musculoskeletal: Normal range of motion.  Skin:    General: Skin is warm and dry.     Findings: No rash.  Neurological:     General: No focal deficit present.     Mental Status: She is alert and oriented to person, place, and time.     Sensory: No sensory deficit.     Deep Tendon Reflexes: Reflexes normal (Hyporeflexive but equal in bilateral LE's.).      ED Treatments / Results  Labs (all labs ordered are listed, but only abnormal results are displayed) Labs Reviewed  URINALYSIS, ROUTINE W REFLEX MICROSCOPIC  RAPID URINE DRUG SCREEN, HOSP PERFORMED    EKG None  Radiology No results  found.  Procedures Procedures (including critical care time)  Medications Ordered in ED Medications  oxyCODONE-acetaminophen (PERCOCET/ROXICET) 5-325 MG per tablet 2 tablet (has no administration in time range)     Initial Impression / Assessment and Plan / ED Course  I have reviewed the triage vital signs and the nursing notes.  Pertinent labs & imaging results that were available during my care of the patient were reviewed by me and considered in my medical decision making (see chart for details).     Patient to ED with husband with complaint of low back and right groin pain, radiates to right leg, LE weakness causing fall.   Chart reviewed. The patient has chronic pain issues. Has been evaluated and managed for low back pain for over 1 year. She brings results of MRI lumbar and pelvis to ED which is reviewed with Dr. Jeraldine Loots. Encounter today will focus on pain control with discharge home to outpatient follow up.  The patient does not want to return to Dr. Farris Has and is requesting referral to neurosurgery which will be provided on Discharge. UA pending for evaluation of her dysuria. UDS noted positive for multiple substances, including opiates.   Discussed pain management would be limited through the ED. Will give #8 Percocet and muscle relaxer.   UA shows nitrite positive UTI. She states she  has a Rx for Keflex at home and actually started taking it this morning. She is advised to continue taking Keflex for the next 5 days.  Final Clinical Impressions(s) / ED Diagnoses   Final diagnoses:  None   1. Chronic low back pain 2. Lumbar radiculopathy 3. UTI  ED Discharge Orders    None       Danne HarborUpstill, Lathaniel Legate, PA-C 03/17/18 1930    Gerhard MunchLockwood, Robert, MD 03/17/18 2326

## 2018-03-22 ENCOUNTER — Inpatient Hospital Stay: Payer: Self-pay | Admitting: Family Medicine

## 2018-04-04 ENCOUNTER — Other Ambulatory Visit: Payer: Self-pay

## 2018-04-04 ENCOUNTER — Other Ambulatory Visit: Payer: Self-pay | Admitting: Family Medicine

## 2018-04-04 DIAGNOSIS — M8580 Other specified disorders of bone density and structure, unspecified site: Secondary | ICD-10-CM

## 2018-04-04 MED ORDER — ALENDRONATE SODIUM 35 MG PO TABS: 35.0000 mg | ORAL_TABLET | ORAL | 0 refills | Status: DC

## 2018-04-04 NOTE — [facility] Notes (Signed)
Fosamax refillde- no additional refills without CPE scheduled.

## 2018-04-04 NOTE — Telephone Encounter (Signed)
Pt pharmacy request rf on pts alendronate sodium. Refilled one months worth and pt has an appt on 04/15/18.

## 2018-04-15 ENCOUNTER — Ambulatory Visit: Payer: Managed Care, Other (non HMO) | Admitting: Family Medicine

## 2018-04-15 DIAGNOSIS — Z0289 Encounter for other administrative examinations: Secondary | ICD-10-CM

## 2018-04-17 ENCOUNTER — Other Ambulatory Visit: Payer: Self-pay | Admitting: Physical Medicine and Rehabilitation

## 2018-04-17 ENCOUNTER — Encounter: Payer: Self-pay | Admitting: Family Medicine

## 2018-04-17 DIAGNOSIS — M25552 Pain in left hip: Principal | ICD-10-CM

## 2018-04-17 DIAGNOSIS — M25551 Pain in right hip: Secondary | ICD-10-CM

## 2018-04-19 ENCOUNTER — Other Ambulatory Visit: Payer: Self-pay | Admitting: Family Medicine

## 2018-04-22 ENCOUNTER — Encounter (HOSPITAL_COMMUNITY): Payer: Self-pay | Admitting: Emergency Medicine

## 2018-04-22 ENCOUNTER — Inpatient Hospital Stay
Admission: AD | Admit: 2018-04-22 | Discharge: 2018-04-26 | DRG: 885 | Disposition: A | Discharge status: Home / Self Care | Payer: 59 | Source: Intra-hospital | Attending: Psychiatry | Admitting: Psychiatry

## 2018-04-22 ENCOUNTER — Emergency Department (HOSPITAL_COMMUNITY)
Admission: EM | Admit: 2018-04-22 | Discharge: 2018-04-22 | Disposition: A | Discharge status: Home / Self Care | Payer: Managed Care, Other (non HMO) | Attending: Emergency Medicine | Admitting: Emergency Medicine

## 2018-04-22 ENCOUNTER — Other Ambulatory Visit: Payer: Self-pay

## 2018-04-22 DIAGNOSIS — I10 Essential (primary) hypertension: Secondary | ICD-10-CM | POA: Diagnosis present

## 2018-04-22 DIAGNOSIS — F322 Major depressive disorder, single episode, severe without psychotic features: Secondary | ICD-10-CM | POA: Insufficient documentation

## 2018-04-22 DIAGNOSIS — G8929 Other chronic pain: Secondary | ICD-10-CM | POA: Diagnosis present

## 2018-04-22 DIAGNOSIS — Z791 Long term (current) use of non-steroidal anti-inflammatories (NSAID): Secondary | ICD-10-CM

## 2018-04-22 DIAGNOSIS — F329 Major depressive disorder, single episode, unspecified: Secondary | ICD-10-CM | POA: Diagnosis present

## 2018-04-22 DIAGNOSIS — F332 Major depressive disorder, recurrent severe without psychotic features: Secondary | ICD-10-CM | POA: Diagnosis present

## 2018-04-22 DIAGNOSIS — Z803 Family history of malignant neoplasm of breast: Secondary | ICD-10-CM

## 2018-04-22 DIAGNOSIS — Z825 Family history of asthma and other chronic lower respiratory diseases: Secondary | ICD-10-CM

## 2018-04-22 DIAGNOSIS — F988 Other specified behavioral and emotional disorders with onset usually occurring in childhood and adolescence: Secondary | ICD-10-CM | POA: Diagnosis present

## 2018-04-22 DIAGNOSIS — Z87442 Personal history of urinary calculi: Secondary | ICD-10-CM

## 2018-04-22 DIAGNOSIS — Z981 Arthrodesis status: Secondary | ICD-10-CM | POA: Diagnosis not present

## 2018-04-22 DIAGNOSIS — R4182 Altered mental status, unspecified: Secondary | ICD-10-CM | POA: Diagnosis not present

## 2018-04-22 DIAGNOSIS — M810 Age-related osteoporosis without current pathological fracture: Secondary | ICD-10-CM | POA: Diagnosis present

## 2018-04-22 DIAGNOSIS — Z87891 Personal history of nicotine dependence: Secondary | ICD-10-CM | POA: Insufficient documentation

## 2018-04-22 DIAGNOSIS — Z8249 Family history of ischemic heart disease and other diseases of the circulatory system: Secondary | ICD-10-CM

## 2018-04-22 DIAGNOSIS — R7303 Prediabetes: Secondary | ICD-10-CM | POA: Diagnosis present

## 2018-04-22 DIAGNOSIS — Z7983 Long term (current) use of bisphosphonates: Secondary | ICD-10-CM

## 2018-04-22 DIAGNOSIS — Z8261 Family history of arthritis: Secondary | ICD-10-CM | POA: Diagnosis not present

## 2018-04-22 DIAGNOSIS — Z807 Family history of other malignant neoplasms of lymphoid, hematopoietic and related tissues: Secondary | ICD-10-CM

## 2018-04-22 DIAGNOSIS — F1320 Sedative, hypnotic or anxiolytic dependence, uncomplicated: Secondary | ICD-10-CM | POA: Diagnosis present

## 2018-04-22 DIAGNOSIS — R45851 Suicidal ideations: Secondary | ICD-10-CM | POA: Diagnosis present

## 2018-04-22 DIAGNOSIS — Z800 Family history of malignant neoplasm of digestive organs: Secondary | ICD-10-CM

## 2018-04-22 DIAGNOSIS — F32A Depression, unspecified: Secondary | ICD-10-CM

## 2018-04-22 DIAGNOSIS — F1120 Opioid dependence, uncomplicated: Secondary | ICD-10-CM | POA: Diagnosis present

## 2018-04-22 DIAGNOSIS — Z811 Family history of alcohol abuse and dependence: Secondary | ICD-10-CM | POA: Diagnosis not present

## 2018-04-22 DIAGNOSIS — Z79899 Other long term (current) drug therapy: Secondary | ICD-10-CM | POA: Insufficient documentation

## 2018-04-22 DIAGNOSIS — M069 Rheumatoid arthritis, unspecified: Secondary | ICD-10-CM | POA: Diagnosis present

## 2018-04-22 DIAGNOSIS — F1220 Cannabis dependence, uncomplicated: Secondary | ICD-10-CM | POA: Insufficient documentation

## 2018-04-22 DIAGNOSIS — F1020 Alcohol dependence, uncomplicated: Secondary | ICD-10-CM | POA: Insufficient documentation

## 2018-04-22 DIAGNOSIS — Z818 Family history of other mental and behavioral disorders: Secondary | ICD-10-CM

## 2018-04-22 DIAGNOSIS — F4310 Post-traumatic stress disorder, unspecified: Secondary | ICD-10-CM | POA: Diagnosis present

## 2018-04-22 DIAGNOSIS — R5382 Chronic fatigue, unspecified: Secondary | ICD-10-CM | POA: Diagnosis present

## 2018-04-22 DIAGNOSIS — Z96611 Presence of right artificial shoulder joint: Secondary | ICD-10-CM | POA: Diagnosis present

## 2018-04-22 DIAGNOSIS — F909 Attention-deficit hyperactivity disorder, unspecified type: Secondary | ICD-10-CM | POA: Diagnosis present

## 2018-04-22 DIAGNOSIS — M797 Fibromyalgia: Secondary | ICD-10-CM | POA: Diagnosis present

## 2018-04-22 LAB — COMPREHENSIVE METABOLIC PANEL
ALT: 15 U/L (ref 0–44)
AST: 17 U/L (ref 15–41)
Albumin: 4 g/dL (ref 3.5–5.0)
Alkaline Phosphatase: 101 U/L (ref 38–126)
Anion gap: 10 (ref 5–15)
BUN: 15 mg/dL (ref 8–23)
CHLORIDE: 105 mmol/L (ref 98–111)
CO2: 24 mmol/L (ref 22–32)
Calcium: 9 mg/dL (ref 8.9–10.3)
Creatinine, Ser: 0.83 mg/dL (ref 0.44–1.00)
GFR calc Af Amer: >60 mL/min (ref >60)
GFR calc non Af Amer: >60 mL/min (ref >60)
Glucose, Bld: 108 mg/dL — ABNORMAL HIGH (ref 70–99)
Potassium: 3.8 mmol/L (ref 3.5–5.1)
Sodium: 139 mmol/L (ref 135–145)
Total Bilirubin: 0.4 mg/dL (ref 0.3–1.2)
Total Protein: 7.2 g/dL (ref 6.5–8.1)

## 2018-04-22 LAB — CBC
HCT: 42.5 % (ref 36.0–46.0)
Hemoglobin: 13.8 g/dL (ref 12.0–15.0)
MCH: 29.3 pg (ref 26.0–34.0)
MCHC: 32.5 g/dL (ref 30.0–36.0)
MCV: 90.2 fL (ref 80.0–100.0)
NRBC: 0 % (ref 0.0–0.2)
Platelets: 398 10*3/uL (ref 150–400)
RBC: 4.71 MIL/uL (ref 3.87–5.11)
RDW: 13.5 % (ref 11.5–15.5)
WBC: 6.4 10*3/uL (ref 4.0–10.5)

## 2018-04-22 LAB — RAPID URINE DRUG SCREEN, HOSP PERFORMED
Amphetamines: POSITIVE — AB
Barbiturates: NOT DETECTED
Benzodiazepines: POSITIVE — AB
COCAINE: NOT DETECTED
Opiates: POSITIVE — AB
Tetrahydrocannabinol: POSITIVE — AB

## 2018-04-22 LAB — SALICYLATE LEVEL: Salicylate Lvl: <7 mg/dL (ref 2.8–30.0)

## 2018-04-22 LAB — ACETAMINOPHEN LEVEL: Acetaminophen (Tylenol), Serum: <10 ug/mL — ABNORMAL LOW (ref 10–30)

## 2018-04-22 LAB — ETHANOL: Alcohol, Ethyl (B): 99 mg/dL — ABNORMAL HIGH (ref <10)

## 2018-04-22 LAB — CBG MONITORING, ED: Glucose-Capillary: 80 mg/dL (ref 70–99)

## 2018-04-22 MED ORDER — ARIPIPRAZOLE 10 MG PO TABS: 15.0000 mg | ORAL_TABLET | Freq: Every evening | ORAL | Status: DC | PRN

## 2018-04-22 MED ORDER — MAGNESIUM OXIDE 400 (241.3 MG) MG PO TABS
400.0000 mg | ORAL_TABLET | Freq: Every day | ORAL | Status: DC
  Administered 2018-04-22 – 2018-04-26 (×5): 400 mg via ORAL
  Filled 2018-04-22 (×5): qty 1

## 2018-04-22 MED ORDER — LISINOPRIL 10 MG PO TABS
10.0000 mg | ORAL_TABLET | Freq: Every day | ORAL | Status: DC
  Administered 2018-04-23 – 2018-04-26 (×4): 10 mg via ORAL
  Filled 2018-04-22 (×4): qty 1

## 2018-04-22 MED ORDER — ALENDRONATE SODIUM 35 MG PO TABS: 35.0000 mg | ORAL_TABLET | ORAL | Status: DC

## 2018-04-22 MED ORDER — ARIPIPRAZOLE 5 MG PO TABS
15.0000 mg | ORAL_TABLET | Freq: Every evening | ORAL | Status: DC | PRN
  Administered 2018-04-22: 15 mg via ORAL
  Filled 2018-04-22: qty 1

## 2018-04-22 MED ORDER — TRAZODONE HCL 100 MG PO TABS
100.0000 mg | ORAL_TABLET | Freq: Every evening | ORAL | Status: DC | PRN
  Administered 2018-04-23 – 2018-04-25 (×4): 100 mg via ORAL
  Filled 2018-04-22 (×4): qty 1

## 2018-04-22 MED ORDER — HYDROXYZINE HCL 25 MG PO TABS
25.0000 mg | ORAL_TABLET | ORAL | Status: DC | PRN
  Administered 2018-04-23 – 2018-04-25 (×5): 25 mg via ORAL
  Filled 2018-04-22 (×6): qty 1

## 2018-04-22 MED ORDER — LORAZEPAM 2 MG/ML IJ SOLN
INTRAMUSCULAR | Status: AC
  Administered 2018-04-22: 2 mg
  Filled 2018-04-22: qty 1

## 2018-04-22 MED ORDER — OXCARBAZEPINE 300 MG PO TABS
600.0000 mg | ORAL_TABLET | Freq: Every day | ORAL | Status: DC
  Administered 2018-04-23 – 2018-04-26 (×4): 600 mg via ORAL
  Filled 2018-04-22 (×5): qty 2

## 2018-04-22 MED ORDER — MELOXICAM 7.5 MG PO TABS
15.0000 mg | ORAL_TABLET | Freq: Every day | ORAL | Status: DC
  Administered 2018-04-23 – 2018-04-26 (×4): 15 mg via ORAL
  Filled 2018-04-22 (×4): qty 2

## 2018-04-22 MED ORDER — SODIUM CHLORIDE 0.9 % IV BOLUS
1000.0000 mL | Freq: Once | INTRAVENOUS | Status: AC
  Administered 2018-04-22: 1000 mL via INTRAVENOUS

## 2018-04-22 MED ORDER — LISINOPRIL 10 MG PO TABS
10.0000 mg | ORAL_TABLET | Freq: Every day | ORAL | Status: DC
  Administered 2018-04-22: 10 mg via ORAL
  Filled 2018-04-22: qty 1

## 2018-04-22 MED ORDER — VENLAFAXINE HCL ER 75 MG PO CP24
75.0000 mg | ORAL_CAPSULE | Freq: Every day | ORAL | Status: DC
  Administered 2018-04-22: 75 mg via ORAL
  Filled 2018-04-22 (×2): qty 1

## 2018-04-22 MED ORDER — PRAZOSIN HCL 2 MG PO CAPS
2.0000 mg | ORAL_CAPSULE | Freq: Every day | ORAL | Status: DC
  Administered 2018-04-23 – 2018-04-26 (×4): 2 mg via ORAL
  Filled 2018-04-22 (×4): qty 1

## 2018-04-22 MED ORDER — VENLAFAXINE HCL ER 75 MG PO CP24
75.0000 mg | ORAL_CAPSULE | Freq: Every day | ORAL | Status: DC
  Administered 2018-04-23: 75 mg via ORAL
  Filled 2018-04-22: qty 1

## 2018-04-22 MED ORDER — HALOPERIDOL LACTATE 5 MG/ML IJ SOLN
10.0000 mg | Freq: Once | INTRAMUSCULAR | Status: AC
  Administered 2018-04-22: 10 mg via INTRAMUSCULAR
  Filled 2018-04-22: qty 2

## 2018-04-22 MED ORDER — VITAMIN D 25 MCG (1000 UNIT) PO TABS
1000.0000 [IU] | ORAL_TABLET | Freq: Every day | ORAL | Status: DC
  Administered 2018-04-22 – 2018-04-26 (×5): 1000 [IU] via ORAL
  Filled 2018-04-22 (×5): qty 1

## 2018-04-22 MED ORDER — OXCARBAZEPINE 300 MG PO TABS
600.0000 mg | ORAL_TABLET | Freq: Every day | ORAL | Status: DC
  Administered 2018-04-22: 600 mg via ORAL
  Filled 2018-04-22: qty 2

## 2018-04-22 MED ORDER — ALUM & MAG HYDROXIDE-SIMETH 200-200-20 MG/5ML PO SUSP: 30.0000 mL | ORAL | Status: DC | PRN

## 2018-04-22 MED ORDER — ACETAMINOPHEN 325 MG PO TABS
650.0000 mg | ORAL_TABLET | Freq: Four times a day (QID) | ORAL | Status: DC | PRN
  Administered 2018-04-23 – 2018-04-26 (×10): 650 mg via ORAL
  Filled 2018-04-22 (×10): qty 2

## 2018-04-22 MED ORDER — PRAZOSIN HCL 2 MG PO CAPS
2.0000 mg | ORAL_CAPSULE | Freq: Every day | ORAL | Status: DC
  Administered 2018-04-22: 2 mg via ORAL
  Filled 2018-04-22 (×2): qty 1

## 2018-04-22 MED ORDER — MAGNESIUM HYDROXIDE 400 MG/5ML PO SUSP: 30.0000 mL | Freq: Every day | ORAL | Status: DC | PRN

## 2018-04-22 MED ORDER — MELOXICAM 7.5 MG PO TABS
15.0000 mg | ORAL_TABLET | Freq: Every day | ORAL | Status: DC
  Administered 2018-04-22: 15 mg via ORAL
  Filled 2018-04-22: qty 2

## 2018-04-22 NOTE — ED Triage Notes (Signed)
Pt was given Haldol before transfer to [facility] and has been sleeping since . Urine sample was not collected before transfer. Pt sleeping soundly . Pt just attempted to collect urine sample but not able to void. Pt provided the something to drink . Waiting for urine sample .

## 2018-04-22 NOTE — ED Notes (Signed)
The pt has thrown urine spilled oput of plastic container in the floor

## 2018-04-22 NOTE — ED Notes (Signed)
Regular dinner meal tray ordered 

## 2018-04-22 NOTE — ED Triage Notes (Addendum)
Pt brought to ED voluntarily by [employer].  Reports she is suicidal x 1 year with a plan to overdose on pills but states she won't do it because of her dogs.  [employer] states pt found her husband cheating on her.

## 2018-04-22 NOTE — [facility] Notes (Signed)
.  Pt. meets criteria for inpatient treatment per Nira Conn, NP.  Referred out to the following hospitals:  [facility]  [facility]  [facility]  [facility]  [facility]  [facility]  [facility]  [facility]      Disposition CSW will continue to follow for placement.  Timmothy Euler. Kaylyn Lim, MSW, LCSWA Disposition Clinical Social Work [phone] (cell) [phone] (office)

## 2018-04-22 NOTE — ED Notes (Signed)
ptr placing fingers down her throat making herself comit

## 2018-04-22 NOTE — Progress Notes (Signed)

## 2018-04-22 NOTE — ED Provider Notes (Addendum)
[facility] EMERGENCY DEPARTMENT Provider Note   CSN: [id number] Arrival date & time: 04/22/18  0128     History   Chief Complaint Chief Complaint  Patient presents with  . Suicidal    HPI Kara [name] is a 63 y.o. female.  Patient to ED with police, whom she called due to suicidal ideations. She endorses depression and plan to overdose on medications. She denies self harm or overdose prior to arrival. She does state she took her regular oxycodone for her fibromyalgia pain. No HI/AVH.  The history is provided by the patient. No language interpreter was used.    Past Medical History:  Diagnosis Date  . ADD (attention deficit disorder)    on Adderal  . ADD (attention deficit disorder)   . Aggressive behavior of adult   . Anemia   . Anxiety   . Cellulitis of breast 11/2013   RIGHT BREAST  . Childhood asthma   . 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 Chronic lower back pain   . Chronic pain    went to [facility] for pain control; stopped in 2016 " (02/23/2017)  . Cold sore   . 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 Degenerative disc disease, lumbar   . Depression    takes meds daily  . Family history of malignant neoplasm of breast   . Fibromyalgia   . Fibromyalgia   . History of kidney stones   . Hypertension   . Osteoporosis   . Osteoporosis   . Other specified rheumatoid arthritis, right shoulder (HCC) 08/01/2011  . Pneumonia 2010?  [name] Post-nasal drip    hx of  . Pre-diabetes   . RA (rheumatoid arthritis) (HCC)    autoimmune arthritis  . RA (rheumatoid arthritis) (HCC)   . Suicidal intent     Patient Active Problem List   Diagnosis Date Noted  . 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 ADD (attention deficit disorder) 09/02/2017  . 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 Cannabis use disorder, moderate, dependence (HCC) 08/31/2017  . Decreased libido 03/29/2017  . Lumbar stenosis 02/23/2017  . Spinal stenosis of lumbar region with neurogenic claudication 02/19/2017  . S/P lumbar spinal fusion 01/01/2017  . Essential hypertension 07/26/2016  . DNR (do not resuscitate) discussion   . Palliative care by specialist   . Cervical pseudoarthrosis (HCC) 06/21/2016  . Opioid use disorder, moderate, dependence (HCC) 06/14/2016  . Mitral valve prolapse 01/04/2016  . HNP (herniated nucleus pulposus), lumbar 12/10/2015  . Prediabetes 08/19/2015  . Overweight (BMI 25.0-29.9) 08/18/2015  . Chronic pain 08/18/2015  . Vitamin D deficiency 08/18/2015  . Chronic fatigue disorder 06/14/2015  . S/P cervical spinal fusion 03/15/2015  . PTSD (post-traumatic stress disorder) 03/07/2014  . Suicide threat or attempt 03/06/2014  . Aggressive behavior   . Family history of malignant neoplasm of breast   . Rheumatoid arthritis (HCC) 12/13/2011  . Fibromyalgia 12/13/2011  . H/O cold sores 12/13/2011    Past Surgical History:  Procedure Laterality Date  . ANTERIOR CERVICAL DECOMP/DISCECTOMY FUSION N/A 03/15/2015   Procedure: Cervical five-six, Cerival six-seven, Anterior Cervical Discectomy and Fusion, Allograft and Plate;  Surgeon: Eldred Manges, MD;  Location: [facility] OR;  Service: Orthopedics;  Laterality: N/A;  . AUGMENTATION MAMMAPLASTY  2003  .  BACK SURGERY    . BLADDER SUSPENSION  2009  . BREAST IMPLANT REMOVAL Bilateral 10/2013  . CERVICAL WOUND DEBRIDEMENT N/A 07/07/2016   Procedure: IRRIGATION AND DEBRIDEMENT POSTERIOR NECK;  Surgeon: Eldred Manges, MD;  Location: [facility] OR;  Service: Orthopedics;  Laterality: N/A;  . COLONOSCOPY    . COMBINED ABDOMINOPLASTY AND LIPOSUCTION  2003  . INCISION AND DRAINAGE ABSCESS Right 01/16/2014   Procedure: INCISION AND DRAINAGE AND OF RIGHT BREAST ABCESS;   Surgeon: Glenna Fellows, MD;  Location: [facility] ORS;  Service: General;  Laterality: Right;  . JOINT REPLACEMENT    . LUMBAR LAMINECTOMY/DECOMPRESSION MICRODISCECTOMY N/A 12/10/2015   Procedure: Right L3-4 Hemilaminectomy, Excision of herniated nucleus pulposus;  Surgeon: Eldred Manges, MD;  Location: [facility] OR;  Service: Orthopedics;  Laterality: N/A;  . MASS EXCISION  11/03/2011   Procedure: MINOR EXCISION OF MASS;  Surgeon: Wyn Forster., MD;  Location:  SURGERY CENTER;  Service: Orthopedics;  Laterality: Left;  debride IP joint, cyst excision left index  . MAXIMUM ACCESS (MAS) TRANSFORAMINAL LUMBAR INTERBODY FUSION (TLIF) 2 LEVEL Right 02/23/2017  . POSTERIOR CERVICAL FUSION/FORAMINOTOMY N/A 06/21/2016   Procedure: POSTERIOR CERVICAL FUSION C5-C7 SPINOUS PROCESS WIRING;  Surgeon: Eldred Manges, MD;  Location: [facility] OR;  Service: Orthopedics;  Laterality: N/A;  . POSTERIOR LUMBAR FUSION  07/2009; 07/03/2014   "L4-5; L5-S1"  . SHOULDER ARTHROSCOPY Right 2012  . TONSILLECTOMY  1987  . TOTAL SHOULDER ARTHROPLASTY  08/01/2011   Procedure: TOTAL SHOULDER ARTHROPLASTY;  Surgeon: Eulas Post, MD;  Location: [facility] OR;  Service: Orthopedics;  Laterality: Right;  Right total shoulder arthroplasty  . TUBAL LIGATION  1988     OB History   No obstetric history on file.      Home Medications    Prior to Admission medications   Medication Sig Start Date End Date Taking? Authorizing Provider  acetaminophen-codeine (TYLENOL #3) 300-30 MG tablet Take 1 tablet by mouth every 8 (eight) hours as needed for moderate pain. Patient not taking: Reported on 02/02/2018 08/30/17   Eldred Manges, MD  alendronate (FOSAMAX) 35 MG tablet Take 1 tablet (35 mg total) by mouth every 7 (seven) days. Take with a full glass of water on an empty stomach. 04/04/18   Kuneff, Renee A, DO  ALPRAZolam (XANAX) 1 MG tablet Take 1 mg by mouth 2 (two) times daily as needed for anxiety.  12/10/17   [provider]    amphetamine-dextroamphetamine (ADDERALL XR) 30 MG 24 hr capsule Take 60 mg by mouth daily after lunch.  12/24/17   [provider]  amphetamine-dextroamphetamine (ADDERALL) 30 MG tablet Take 30 mg by mouth daily. 12/24/17   [provider]  ARIPiprazole (ABILIFY) 15 MG tablet Take 15 mg by mouth at bedtime as needed (depression).  02/02/18   [provider]  cephALEXin (KEFLEX) 500 MG capsule Take 500 mg by mouth once. #14 filled 12/25/17    [provider]  cholecalciferol (VITAMIN D) 1000 units tablet Take 1,000 Units by mouth daily.    [provider]  diclofenac sodium (VOLTAREN) 1 % GEL Apply 1 application topically daily as needed for pain. 01/13/15   [provider]  divalproex (DEPAKOTE ER) 500 MG 24 hr tablet Take 1 tablet (500 mg total) by mouth 2 (two) times daily at 8 am and 10 pm. Patient not taking: Reported on 02/02/2018 09/05/17   Pucilowska, Braulio Conte B, MD  haloperidol (HALDOL) 10 MG tablet Take 1 tablet (10 mg  total) by mouth every 6 (six) hours as needed for agitation. Patient not taking: Reported on 09/18/2017 09/05/17   Pucilowska, Ellin GoodieJolanta B, MD  HYDROcodone-acetaminophen (NORCO) 5-325 MG tablet Take 1 tablet by mouth every 6 (six) hours as needed for severe pain. Patient not taking: Reported on 08/27/2017 06/24/17   Doug SouJacubowitz, Sam, MD  lisinopril (PRINIVIL,ZESTRIL) 10 MG tablet TAKE 1 AND 1/2 TABLETS DAILY BY MOUTH Patient taking differently: Take 10 mg by mouth daily.  03/07/18   Kuneff, Renee A, DO  MAGNESIUM PO Take 1 tablet by mouth daily.    [provider]  meloxicam (MOBIC) 15 MG tablet TAKE 1 TABLET (15 MG) BY MOUTH EVERY DAY Patient taking differently: Take 15 mg by mouth daily.  07/06/17   Kerrin ChampagneNitka, James E, MD  methocarbamol (ROBAXIN) 500 MG tablet Take 1 tablet (500 mg total) by mouth 2 (two) times daily. 03/17/18   Elpidio AnisUpstill, Clotilde Loth, PA-C  oxcarbazepine (TRILEPTAL) 600 MG tablet Take 600 mg by mouth daily.  02/15/18    [provider]  oxyCODONE-acetaminophen (PERCOCET/ROXICET) 5-325 MG tablet Take 1 tablet by mouth every 6 (six) hours as needed for severe pain. 03/17/18   Elpidio AnisUpstill, Raileigh Sabater, PA-C  prazosin (MINIPRESS) 2 MG capsule Take 1 capsule (2 mg total) by mouth 2 (two) times daily. Patient taking differently: Take 2 mg by mouth daily.  09/05/17   Pucilowska, Jolanta B, MD  tretinoin (RETIN-A) 0.1 % cream Apply 1 application topically at bedtime as needed (facial blemishes).  01/28/18   [provider]  venlafaxine XR (EFFEXOR-XR) 75 MG 24 hr capsule Take 75 mg by mouth daily with breakfast.     [provider]  zolpidem (AMBIEN) 10 MG tablet Take 10 mg by mouth at bedtime. 01/12/18   [provider]    Family History Family History  Problem Relation Age of Onset  . Arthritis Mother   . Heart disease Mother        ?psvt  . Breast cancer Mother [id number]       TAH/BSO  . Cancer Mother   . Mental illness Mother   . COPD Father   . Hypertension Father   . Alcohol abuse Father   . Mental illness Father   . Heart disease Father   . Healthy Daughter   . Breast cancer Maternal Aunt 27       deceased  . Cancer Cousin [id number]       female; unknown primary  . Colon cancer Paternal Aunt [id number]       deceased at 3167  . Stomach cancer Paternal Uncle [id number]       deceased at 5192  . Alcohol abuse Brother   . Cancer Brother   . Hodgkin's lymphoma Brother   . Mental illness Brother   . HIV Brother   . Healthy Brother   . Healthy Son     Social History Social History   Tobacco Use  . Smoking status: Former Smoker    Packs/day: 1.00    Years: 10.00    Pack years: 10.00    Types: Cigarettes    Last attempt to quit: 12/06/2005    Years since quitting: 12.3  . Smokeless tobacco: Never Used  Substance Use Topics  . Alcohol use: Yes    Comment: 02/23/2017 "might have 1-2 drinks/month"  . Drug use: Yes    Types: Marijuana    Comment: "i smoke marijuana to avoid opiates"       Allergies   Aspirin; Bee venom;  Ibuprofen; Ativan [lorazepam]; Ketamine; Toradol [ketorolac tromethamine]; and Tramadol hcl   Review of Systems Review of Systems  Constitutional: Negative for chills and fever.  HENT: Negative.   Respiratory: Negative.   Cardiovascular: Negative.   Gastrointestinal: Negative.   Musculoskeletal: Positive for myalgias (c/w chronic pain).  Skin: Negative.   Neurological: Negative.   Psychiatric/Behavioral: Positive for dysphoric mood and suicidal ideas. Negative for hallucinations.     Physical Exam Updated Vital Signs BP (!) 89/63   Pulse 84   Temp (!) 97.4 F (36.3 C) (Oral)   Resp 16   SpO2 94%   Physical Exam Vitals signs and nursing note reviewed.  Constitutional:      Appearance: She is well-developed.     Comments: Patient with slurred speech, somnolent, verbal.  HENT:     Head: Normocephalic.  Eyes:     Comments: Pupils pinpoint bilaterally.   Neck:     Musculoskeletal: Normal range of motion and neck supple.  Cardiovascular:     Rate and Rhythm: Normal rate and regular rhythm.  Pulmonary:     Effort: Pulmonary effort is normal.     Breath sounds: Normal breath sounds. No wheezing, rhonchi or rales.  Abdominal:     General: Bowel sounds are normal.     Palpations: Abdomen is soft.     Tenderness: There is no abdominal tenderness. There is no guarding or rebound.  Musculoskeletal: Normal range of motion.  Skin:    General: Skin is warm and dry.     Findings: No rash.  Neurological:     Mental Status: She is alert and oriented to person, place, and time.     GCS: GCS eye subscore is 4. GCS verbal subscore is 5. GCS motor subscore is 6.     Sensory: Sensation is intact.     Motor: Motor function is intact.     Comments: Slurred speech but focused responses. Somnolent but easily awakened, answering questions appropriately.       ED Treatments / Results  Labs (all labs ordered are listed, but only abnormal results  are displayed) Labs Reviewed  COMPREHENSIVE METABOLIC PANEL  ETHANOL  SALICYLATE LEVEL  ACETAMINOPHEN LEVEL  CBC  RAPID URINE DRUG SCREEN, HOSP PERFORMED    EKG None  Radiology No results found.  Procedures Procedures (including critical care time)  Medications Ordered in ED Medications - No data to display   Initial Impression / Assessment and Plan / ED Course  I have reviewed the triage vital signs and the nursing notes.  Pertinent labs & imaging results that were available during my care of the patient were reviewed by me and considered in my medical decision making (see chart for details).     Patient to ED with SI. Patient called [employer] who brought her to ED voluntarily. Patient denies overdose prior to arrival, however, she is altered with slurred speech and somnolence, pinpoint pupils to exam. Consider opioid overdose. Patient on monitor, spontaneous respirations, normal O2 saturations, no tachycardia. BP 106/80. IV established.   3:00 - patient is awake.She is becoming increasing agitated, constantly crying out, having fits of crying. VS have remained stable. Labs are essentially unremarkable and she can be medically cleared for psychiatric evaluation and moved to [facility].  ADDENDUM: the patient was given Ativan for agitation which had little effect. Haldol IM given and the patient is sleeping. VSS. Awaiting placement. She states she wishes to go to [facility] and this preference was passed on to the  TTS team to consider in placing her for treatment.       Final Clinical Impressions(s) / ED Diagnoses   Final diagnoses:  None   1. Suicidal ideation 2. Altered mental status  ED Discharge Orders    None       Danne HarborUpstill, Mohit Zirbes, PA-C 04/22/18 Wynona [id number]    Haviland, Julie, MD 04/22/18 [id number]    Elpidio AnisUpstill, Missi Mcmackin, PA-C 04/22/18 [id number]    Jacalyn LefevreHaviland, Julie, MD 04/22/18 [phone]

## 2018-04-22 NOTE — ED Notes (Signed)
The pt calls out over and over saying she nauseated and vomiting  Keeps putting her finger down her throat to make herself vomit

## 2018-04-22 NOTE — Progress Notes (Signed)
Patient looks sad and tired but cooperative during admission assessment. Patient denies SI/HI at this time. Patient denies AVH. Patient informed of fall risk status, fall risk assessed "high" at this time. Patient oriented to unit/staff/room. Patient denies any questions/concerns at this time.Patient states "I need to lie down."Patient safe on unit with Q15 minute checks for safety. Skin assessment and body search done,no contraband found.

## 2018-04-22 NOTE — Tx Team (Signed)
Initial Treatment Plan 04/22/2018 6:21 PM Kara [name] GEX:[id number]    PATIENT STRESSORS: Health problems Marital or family conflict Traumatic event   PATIENT STRENGTHS: Average or above average intelligence Communication skills Supportive family/friends   PATIENT IDENTIFIED PROBLEMS: Depression  Suicidal thoughts.                   DISCHARGE CRITERIA:  Ability to meet basic life and health needs Adequate post-discharge living arrangements Medical problems require only outpatient monitoring  PRELIMINARY DISCHARGE PLAN: Attend aftercare/continuing care group Outpatient therapy Return to previous living arrangement  PATIENT/FAMILY INVOLVEMENT: This treatment plan has been presented to and reviewed with the patient, Kara [name], and/or family member, .  The patient and family have been given the opportunity to ask questions and make suggestions.  Leonarda Salon, RN 04/22/2018, 6:21 PM

## 2018-04-22 NOTE — ED Notes (Signed)
The pt has pulled her iv out she reports that her iv caught on something and fell out;  Her empty iv bag under the stretcher.  She has dumped the nss in the floor every where  Paper towels all over the floor

## 2018-04-22 NOTE — ED Triage Notes (Signed)
Pt up with assistance to [facility] to collect urine sample.

## 2018-04-22 NOTE — Progress Notes (Signed)
Nurse, Wynona Canes informed of pt disposition. Dr. Particia Nearing informed of pt disposition as well.
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
## 2018-04-22 NOTE — ED Provider Notes (Signed)
  Physical Exam  BP (!) 158/99   Pulse 85   Temp (!) 97.4 F (36.3 C) (Oral)   Resp 18   SpO2 99%   Physical Exam Constitutional:      General: She is not in acute distress. Neurological:     Mental Status: She is alert.  Psychiatric:        Mood and Affect: Mood normal.      MDM    0830: Plan for inpatient psych tx per psych eval earlier this morning. Pt required ativan and haldol last night for behavior.  Re-evaluated again this morning, per RN Magda Paganini has been cooperative since.  Pending inpatient placement.  Labs reviewed.  VSS.       Liberty Handy, PA-C 04/22/18 [id number]    Alvira Monday, MD 04/23/18 [id number]

## 2018-04-22 NOTE — Progress Notes (Signed)
Patient given urine sampling cup for required testing and given patient education. Will take to lab when patient able to provide.

## 2018-04-22 NOTE — ED Triage Notes (Signed)
Pt in bed facing the wall and started to cry and screamed out in a shrilled voice.

## 2018-04-22 NOTE — ED Triage Notes (Signed)
Pt Husband called to report Pt has an out Pt MRI on [facility] 04-24-2018

## 2018-04-22 NOTE — ED Triage Notes (Signed)
Pt was able to collect urine sample .

## 2018-04-22 NOTE — Progress Notes (Signed)
Pt accepted to [facility] per Jerilynn Som, TTS Dr. Viviano Simas, MD, is the accepting/attending provider.  Call report to [phone]  Magda Paganini @ [facility] ED notified.   Pt is Voluntary.  Pt may be transported by [facility]  Pt scheduled to arrive at [facility] as soon as transport can be arranged  Carney Bern T. Kaylyn Lim, MSW, LCSWA Disposition Clinical Social Work [phone] (cell) [phone] (office)  Please fax Voluntary Admission form to [phone]

## 2018-04-23 ENCOUNTER — Encounter: Payer: Self-pay | Admitting: Psychiatry

## 2018-04-23 DIAGNOSIS — F332 Major depressive disorder, recurrent severe without psychotic features: Principal | ICD-10-CM

## 2018-04-23 MED ORDER — ADULT MULTIVITAMIN W/MINERALS CH
1.0000 | ORAL_TABLET | Freq: Every day | ORAL | Status: DC
  Administered 2018-04-23 – 2018-04-26 (×4): 1 via ORAL
  Filled 2018-04-23 (×4): qty 1

## 2018-04-23 MED ORDER — CHLORDIAZEPOXIDE HCL 25 MG PO CAPS
25.0000 mg | ORAL_CAPSULE | Freq: Four times a day (QID) | ORAL | Status: AC
  Administered 2018-04-23 – 2018-04-26 (×11): 25 mg via ORAL
  Filled 2018-04-23 (×11): qty 1

## 2018-04-23 MED ORDER — VENLAFAXINE HCL ER 75 MG PO CP24
150.0000 mg | ORAL_CAPSULE | Freq: Every day | ORAL | Status: DC
  Administered 2018-04-24 – 2018-04-26 (×3): 150 mg via ORAL
  Filled 2018-04-23 (×4): qty 2

## 2018-04-23 MED ORDER — ENSURE ENLIVE PO LIQD
237.0000 mL | Freq: Three times a day (TID) | ORAL | Status: DC
  Administered 2018-04-23 – 2018-04-26 (×6): 237 mL via ORAL

## 2018-04-23 NOTE — Plan of Care (Signed)
Pt. Is Complaint with medications. Pt. Denies si/hi/avh, can contract for safety. Pt. Endorses a normal mood. Pt. Mostly isolative and withdrawn this evening.    Problem: Education: Goal: Emotional status will improve Outcome: Progressing Goal: Mental status will improve Outcome: Progressing   Problem: Safety: Goal: Periods of time without injury will increase Outcome: Progressing   Problem: Health Behavior/Discharge Planning: Goal: Compliance with therapeutic regimen will improve Outcome: Progressing

## 2018-04-23 NOTE — H&P (Signed)
Psychiatric Admission Assessment Adult  Patient Identification: Kara [name] MRN:  [id number] Date of Evaluation:  04/23/2018 Chief Complaint:  Depression Principal Diagnosis: <principal problem not specified> Diagnosis:  Active Problems:   Major depressive disorder, recurrent severe without psychotic features (HCC)   Cannabis use disorder, moderate, dependence (HCC)  History of Present Illness:   Identifying data. [name] is a 63 year old female with a history of depression.  Chief complaint. "I am so shaky."  History of present illness. Information was obtained from the patient and the chart. The patient came to [facility] ER complaining of suicidal ideation with a plan to overdose that she developed over four days after stopping her regular medications prescribed successfully by Dr. [name], her primary psychiatrist. This is reportedly in response to her husband ongoing marital affairs. She reports that since her last hospitalization at [facility] in November 2019, she has been stable, enjoyed a trip to [place] over the holidays, followed up with her therapist weekly. She denies psychotic symptoms or symptoms suggestive of bipolar mania. She reports very high anxiety with shakes that she believes are from Effexor withdrawal. She admits, that Dr. [name] prescribes twice daily Xanax and Adderall but denies misusing it or running out. No alcohol or substances but she was positive for benzos, stimulants, opioids and cannabis on admission.  Past psychiatric history. Childhood trauma. Long history of depression, mood instability, suicide attempts. Often on multiple medications benzos, stimulants, narcotic pain killers for her back. Stable relationship with her psychiatrist and therapist.  Family psychiatric history. Daughter with borderline.  Social history. This is her third marriage. Married for 28 years with ongoing 3 years affair. Graduated from college recently in human resources and  addiction.  Total Time spent with patient: 1 hour  Is the patient at risk to self? Yes.    Has the patient been a risk to self in the past 6 months? Yes.    Has the patient been a risk to self within the distant past? Yes.    Is the patient a risk to others? No.  Has the patient been a risk to others in the past 6 months? No.  Has the patient been a risk to others within the distant past? No.   Prior Inpatient Therapy:   Prior Outpatient Therapy:    Alcohol Screening: 1. How often do you have a drink containing alcohol?: 2 to 4 times a month 2. How many drinks containing alcohol do you have on a typical day when you are drinking?: 1 or 2 3. How often do you have six or more drinks on one occasion?: Never AUDIT-C Score: 2 4. How often during the last year have you found that you were not able to stop drinking once you had started?: Never 5. How often during the last year have you failed to do what was normally expected from you becasue of drinking?: Never 6. How often during the last year have you needed a first drink in the morning to get yourself going after a heavy drinking session?: Never 7. How often during the last year have you had a feeling of guilt of remorse after drinking?: Never 8. How often during the last year have you been unable to remember what happened the night before because you had been drinking?: Never 9. Have you or someone else been injured as a result of your drinking?: No 10. Has a relative or friend or a doctor or another health worker been concerned about your drinking or suggested  you cut down?: No Alcohol Use Disorder Identification Test Final Score (AUDIT): 2 Alcohol Brief Interventions/Follow-up: AUDIT Score <7 follow-up not indicated Substance Abuse History in the last 12 months:  Yes.   Consequences of Substance Abuse: Negative Previous Psychotropic Medications: Yes  Psychological Evaluations: No  Past Medical History:  Past Medical History:   Diagnosis Date  . ADD (attention deficit disorder)    on Adderal  . ADD (attention deficit disorder)   . Aggressive behavior of adult   . Anemia   . Anxiety   . Cellulitis of breast 11/2013   RIGHT BREAST  . Childhood asthma   . 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 Chronic lower back pain   . Chronic pain    went to [facility] for pain control; stopped in 2016 " (02/23/2017)  . Cold sore   . 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 Degenerative disc disease, lumbar   . Depression    takes meds daily  . Family history of malignant neoplasm of breast   . Fibromyalgia   . Fibromyalgia   . History of kidney stones   . Hypertension   . Osteoporosis   . Osteoporosis   . Other specified rheumatoid arthritis, right shoulder (HCC) 08/01/2011  . Pneumonia 2010?  [name] Post-nasal drip    hx of  . Pre-diabetes   . RA (rheumatoid arthritis) (HCC)    autoimmune arthritis  . RA (rheumatoid arthritis) (HCC)   . Suicidal intent     Past Surgical History:  Procedure Laterality Date  . ANTERIOR CERVICAL DECOMP/DISCECTOMY FUSION N/A 03/15/2015   Procedure: Cervical five-six, Cerival six-seven, Anterior Cervical Discectomy and Fusion, Allograft and Plate;  Surgeon: Eldred Manges, MD;  Location: [facility] OR;  Service: Orthopedics;  Laterality: N/A;  . AUGMENTATION MAMMAPLASTY  2003  . BACK SURGERY    . BLADDER SUSPENSION  2009  . BREAST IMPLANT REMOVAL Bilateral 10/2013  . CERVICAL WOUND DEBRIDEMENT N/A 07/07/2016   Procedure: IRRIGATION AND DEBRIDEMENT POSTERIOR NECK;  Surgeon: Eldred Manges, MD;  Location: [facility] OR;  Service: Orthopedics;  Laterality: N/A;  . COLONOSCOPY    . COMBINED ABDOMINOPLASTY AND LIPOSUCTION  2003  . INCISION AND DRAINAGE ABSCESS Right 01/16/2014   Procedure: INCISION AND DRAINAGE AND OF RIGHT BREAST ABCESS;  Surgeon: Glenna Fellows, MD;  Location: [facility] ORS;  Service: General;  Laterality: Right;  . JOINT  REPLACEMENT    . LUMBAR LAMINECTOMY/DECOMPRESSION MICRODISCECTOMY N/A 12/10/2015   Procedure: Right L3-4 Hemilaminectomy, Excision of herniated nucleus pulposus;  Surgeon: Eldred Manges, MD;  Location[facility] OR;  Service: Orthopedics;  Laterality: N/A;  . MASS EXCISION  11/03/2011   Procedure: MINOR EXCISION OF MASS;  Surgeon: Wyn Forster., MD;  Location: [facility];  Service: Orthopedics;  Laterality: Left;  debride IP joint, cyst excision left index  . MAXIMUM ACCESS (MAS) TRANSFORAMINAL LUMBAR INTERBODY FUSION (TLIF) 2 LEVEL Right 02/23/2017  . POSTERIOR CERVICAL FUSION/FORAMINOTOMY N/A 06/21/2016   Procedure: POSTERIOR CERVICAL FUSION C5-C7 SPINOUS PROCESS WIRING;  Surgeon: Eldred Manges, MD;  Location: [facility] OR;  Service: Orthopedics;  Laterality: N/A;  . POSTERIOR LUMBAR FUSION  07/2009; 07/03/2014   "L4-5; L5-S1"  . SHOULDER ARTHROSCOPY Right 2012  . TONSILLECTOMY  1987  . TOTAL SHOULDER ARTHROPLASTY  08/01/2011   Procedure: TOTAL SHOULDER ARTHROPLASTY;  Surgeon: Eulas Post, MD;  Location: [facility] OR;  Service: Orthopedics;  Laterality: Right;  Right total shoulder arthroplasty  . TUBAL LIGATION  1988   Family History:  Family History  Problem Relation Age of Onset  . Arthritis Mother   . Heart disease Mother        ?psvt  . Breast cancer Mother 73       TAH/BSO  . Cancer Mother   . Mental illness Mother   . COPD Father   . Hypertension Father   . Alcohol abuse Father   . Mental illness Father   . Heart disease Father   . Healthy Daughter   . Breast cancer Maternal Aunt 27       deceased  . Cancer Cousin 50       female; unknown primary  . Colon cancer Paternal Aunt 12       deceased at 58  . Stomach cancer Paternal Uncle 17       deceased at 84  . Alcohol abuse Brother   . Cancer Brother   . Hodgkin's lymphoma Brother   . Mental illness Brother   . HIV Brother   . Healthy Brother   . Healthy Son     Tobacco Screening: Have you used any form of tobacco  in the last 30 days? (Cigarettes, Smokeless Tobacco, Cigars, and/or Pipes): No Social History:  Social History   Substance and Sexual Activity  Alcohol Use Yes   Comment: 02/23/2017 "might have 1-2 drinks/month"     Social History   Substance and Sexual Activity  Drug Use Yes  . Types: Marijuana   Comment: "i smoke marijuana to avoid opiates"     Additional Social History: Marital status: Married Number of Years Married: 20 What types of issues is patient dealing with in the relationship?: Pt reports some infidelity in her marriage from her husband. Are you sexually active?: Yes What is your sexual orientation?: Heterosexual Has your sexual activity been affected by drugs, alcohol, medication, or emotional stress?: Pt denies. Does patient have children?: Yes How many children?: 4 How is patient's relationship with their children?: Pt reports "good".                         Allergies:   Allergies  Allergen Reactions  . Aspirin Swelling    Throat swells  . Bee Venom Anaphylaxis  . Ibuprofen Swelling    Throat swells  . Ativan [Lorazepam] Other (See Comments)    Agitation/confusion   . Ketamine Other (See Comments)    Hallucinations  . Toradol [Ketorolac Tromethamine] Itching, Swelling and Rash  . Tramadol Hcl Itching, Swelling and Rash    Tolerates Dilaudid 06/2016.  TDD.   Lab Results:  Results for orders placed or performed during the hospital encounter of 04/22/18 (from the past 48 hour(s))  Comprehensive metabolic panel     Status: Abnormal   Collection Time: 04/22/18  1:41 AM  Result Value Ref Range   Sodium 139 135 - 145 mmol/L   Potassium 3.8 3.5 - 5.1 mmol/L   Chloride 105 98 - 111 mmol/L   CO2 24 22 - 32 mmol/L   Glucose, Bld 108 (H) 70 - 99 mg/dL   BUN 15 8 - 23 mg/dL   Creatinine, Ser 1.61 0.44 - 1.00 mg/dL   Calcium 9.0 8.9 - 09.6 mg/dL   Total Protein 7.2 6.5 - 8.1 g/dL   Albumin 4.0 3.5 - 5.0 g/dL   AST 17 15 - 41 U/L   ALT 15 0 - 44  U/L   Alkaline Phosphatase 101 38 - 126 U/L   Total Bilirubin 0.4 0.3 - 1.2 mg/dL   GFR calc non Af Amer >60 >60 mL/min   GFR calc Af Amer >60 >60 mL/min   Anion gap 10 5 - 15    Comment: Performed at [facility], [address] [place], [place] [address]  Ethanol     Status: Abnormal   Collection Time: 04/22/18  1:41 AM  Result Value Ref Range   Alcohol, Ethyl (B) 99 (H) <10 mg/dL    Comment: (NOTE) Lowest detectable limit for serum alcohol is 10 mg/dL. For medical purposes only. Performed at [facility], [address] [place], [place] [address]   Salicylate level     Status: None   Collection Time: 04/22/18  1:41 AM  Result Value Ref Range   Salicylate Lvl <7.0 2.8 - 30.0 mg/dL    Comment: Performed at [facility], [address] [place], [place] [address]  Acetaminophen level     Status: Abnormal   Collection Time: 04/22/18  1:41 AM  Result Value Ref Range   Acetaminophen (Tylenol), Serum <10 (L) 10 - 30 ug/mL    Comment: (NOTE) Therapeutic concentrations vary significantly. A range of 10-30 ug/mL  may be an effective concentration for many patients. However, some  are best treated at concentrations outside of this range. Acetaminophen concentrations >150 ug/mL at 4 hours after ingestion  and >50 ug/mL at 12 hours after ingestion are often associated with  toxic reactions. Performed at [facility], [address] [place], [place] [address]   cbc     Status: None   Collection Time: 04/22/18  1:41 AM  Result Value Ref Range   WBC 6.4 4.0 - 10.5 K/uL   RBC 4.71 3.87 - 5.11 MIL/uL   Hemoglobin 13.8 12.0 - 15.0 g/dL   HCT 29.542.5 62.136.0 - 30.846.0 %   MCV 90.2 80.0 - 100.0 fL   MCH 29.3 26.0 - 34.0 pg   MCHC 32.5 30.0 - 36.0 g/dL   RDW 65.713.5 84.611.5 - 96.215.5 %   Platelets 398 150 - 400 K/uL   nRBC 0.0 0.0 - 0.2 %    Comment: Performed at [facility], [address] [place], [place] [address]  CBG monitoring, ED     Status: None   Collection  Time: 04/22/18  2:38 AM  Result Value Ref Range   Glucose-Capillary 80 70 - 99 mg/dL  Rapid urine drug screen (hospital performed)     Status: Abnormal   Collection Time: 04/22/18  2:59 PM  Result Value Ref Range   Opiates POSITIVE (A) NONE DETECTED   Cocaine NONE DETECTED NONE DETECTED   Benzodiazepines POSITIVE (A) NONE DETECTED   Amphetamines POSITIVE (A) NONE DETECTED   Tetrahydrocannabinol POSITIVE (A) NONE DETECTED   Barbiturates NONE DETECTED NONE DETECTED    Comment: (NOTE) DRUG SCREEN FOR MEDICAL PURPOSES ONLY.  IF CONFIRMATION IS NEEDED FOR ANY PURPOSE, NOTIFY LAB WITHIN 5 DAYS. LOWEST DETECTABLE LIMITS FOR URINE DRUG SCREEN Drug Class                     Cutoff (ng/mL) Amphetamine and metabolites    1000 Barbiturate and metabolites    200 Benzodiazepine                 200 Tricyclics and metabolites     300 Opiates and metabolites  300 Cocaine and metabolites        300 THC                            50 Performed at [facility], [address] [place], [place] [address]     Blood Alcohol level:  Lab Results  Component Value Date   ETH 99 (H) 04/22/2018   ETH <10 02/26/2018    Metabolic Disorder Labs:  Lab Results  Component Value Date   HGBA1C 5.9 (H) 02/21/2017   MPG 122.63 02/21/2017   MPG 120 12/14/2015   No results found for: PROLACTIN Lab Results  Component Value Date   CHOL 278 (H) 08/18/2015   TRIG 182.0 (H) 08/18/2015   HDL 64.30 08/18/2015   CHOLHDL 4 08/18/2015   VLDL 36.4 08/18/2015   LDLCALC 178 (H) 08/18/2015   LDLCALC 121 (H) 08/01/2013    Current Medications: Current Facility-Administered Medications  Medication Dose Route Frequency Provider Last Rate Last Dose  . acetaminophen (TYLENOL) tablet 650 mg  650 mg Oral Q6H PRN Mariel CraftMaurer, Sheila M, MD   650 mg at 04/23/18 0111  . alum & mag hydroxide-simeth (MAALOX/MYLANTA) 200-200-20 MG/5ML suspension 30 mL  30 mL Oral Q4H PRN Mariel CraftMaurer, Sheila M, MD      . ARIPiprazole  (ABILIFY) tablet 15 mg  15 mg Oral QHS PRN Mariel CraftMaurer, Sheila M, MD      . chlordiazePOXIDE (LIBRIUM) capsule 25 mg  25 mg Oral QID Pucilowska, Jolanta B, MD      . cholecalciferol (VITAMIN D3) tablet 1,000 Units  1,000 Units Oral Daily Mariel CraftMaurer, Sheila M, MD   1,000 Units at 04/23/18 [phone]  . hydrOXYzine (ATARAX/VISTARIL) tablet 25 mg  25 mg Oral Q4H PRN Mariel CraftMaurer, Sheila M, MD   25 mg at 04/23/18 0111  . lisinopril (PRINIVIL,ZESTRIL) tablet 10 mg  10 mg Oral Daily Mariel CraftMaurer, Sheila M, MD   10 mg at 04/23/18 0758  . magnesium hydroxide (MILK OF MAGNESIA) suspension 30 mL  30 mL Oral Daily PRN Mariel CraftMaurer, Sheila M, MD      . magnesium oxide (MAG-OX) tablet 400 mg  400 mg Oral Daily Mariel CraftMaurer, Sheila M, MD   400 mg at 04/23/18 0758  . meloxicam (MOBIC) tablet 15 mg  15 mg Oral Daily Mariel CraftMaurer, Sheila M, MD   15 mg at 04/23/18 0758  . Oxcarbazepine (TRILEPTAL) tablet 600 mg  600 mg Oral Daily Mariel CraftMaurer, Sheila M, MD   600 mg at 04/23/18 [id number]  . prazosin (MINIPRESS) capsule 2 mg  2 mg Oral Daily Mariel CraftMaurer, Sheila M, MD   2 mg at 04/23/18 0758  . traZODone (DESYREL) tablet 100 mg  100 mg Oral QHS PRN Pucilowska, Jolanta B, MD   100 mg at 04/23/18 0111  . [START ON 04/24/2018] venlafaxine XR (EFFEXOR-XR) 24 hr capsule 150 mg  150 mg Oral Q breakfast Pucilowska, Jolanta B, MD       PTA Medications: Medications Prior to Admission  Medication Sig Dispense Refill Last Dose  . acetaminophen-codeine (TYLENOL #3) 300-30 MG tablet Take 1 tablet by mouth every 8 (eight) hours as needed for moderate pain. (Patient not taking: Reported on 02/02/2018) 20 tablet 0 Completed Course at Unknown time  . alendronate (FOSAMAX) 35 MG tablet Take 1 tablet (35 mg total) by mouth every 7 (seven) days. Take with a full glass of water on an empty stomach. 12 tablet 0 04/15/2018 at Unknown time  . ALPRAZolam (  XANAX) 1 MG tablet Take 1 mg by mouth 2 (two) times daily as needed for anxiety.   0 Past Week at Unknown time  . amphetamine-dextroamphetamine  (ADDERALL XR) 30 MG 24 hr capsule Take 60 mg by mouth daily.   0 04/21/2018 at Unknown time  . amphetamine-dextroamphetamine (ADDERALL) 30 MG tablet Take 30 mg by mouth daily at 3 pm.   0 04/21/2018 at Unknown time  . ARIPiprazole (ABILIFY) 15 MG tablet Take 15 mg by mouth at bedtime as needed (depression).   12 Past Week at Unknown time  . cholecalciferol (VITAMIN D) 1000 units tablet Take 1,000 Units by mouth daily.   04/21/2018 at Unknown time  . diclofenac sodium (VOLTAREN) 1 % GEL Apply 1 application topically daily as needed for pain.   Past Month at Unknown time  . divalproex (DEPAKOTE ER) 500 MG 24 hr tablet Take 1 tablet (500 mg total) by mouth 2 (two) times daily at 8 am and 10 pm. (Patient not taking: Reported on 02/02/2018) 60 tablet 1 Not Taking at Unknown time  . haloperidol (HALDOL) 10 MG tablet Take 1 tablet (10 mg total) by mouth every 6 (six) hours as needed for agitation. (Patient not taking: Reported on 09/18/2017) 30 tablet 1 Not Taking at Unknown time  . HYDROcodone-acetaminophen (NORCO) 5-325 MG tablet Take 1 tablet by mouth every 6 (six) hours as needed for severe pain. (Patient not taking: Reported on 08/27/2017) 10 tablet 0 Completed Course at Unknown time  . lisinopril (PRINIVIL,ZESTRIL) 10 MG tablet TAKE 1 AND 1/2 TABLETS DAILY BY MOUTH (Patient taking differently: Take 10 mg by mouth daily. ) 45 tablet 1 04/21/2018 at Unknown time  . MAGNESIUM PO Take 1 tablet by mouth daily.   04/21/2018 at Unknown time  . meloxicam (MOBIC) 15 MG tablet TAKE 1 TABLET (15 MG) BY MOUTH EVERY DAY (Patient taking differently: Take 15 mg by mouth daily. ) 30 tablet 1 04/21/2018 at Unknown time  . methocarbamol (ROBAXIN) 500 MG tablet Take 1 tablet (500 mg total) by mouth 2 (two) times daily. 10 tablet 0 04/21/2018 at Unknown time  . oxcarbazepine (TRILEPTAL) 600 MG tablet Take 600 mg by mouth daily.    04/21/2018 at Unknown time  . oxyCODONE-acetaminophen (PERCOCET/ROXICET) 5-325 MG tablet Take 1 tablet by  mouth every 6 (six) hours as needed for severe pain. (Patient not taking: Reported on 04/22/2018) 8 tablet 0 Completed Course at Unknown time  . prazosin (MINIPRESS) 2 MG capsule Take 1 capsule (2 mg total) by mouth 2 (two) times daily. (Patient taking differently: Take 2 mg by mouth daily. ) 60 capsule 1 04/21/2018 at Unknown time  . tretinoin (RETIN-A) 0.1 % cream Apply 1 application topically at bedtime as needed (facial blemishes).   3 Past Month at Unknown time  . venlafaxine XR (EFFEXOR-XR) 75 MG 24 hr capsule Take 75 mg by mouth daily with breakfast.    04/21/2018 at Unknown time  . zolpidem (AMBIEN) 10 MG tablet Take 10 mg by mouth at bedtime.  2 04/20/2018 at Unknown time    Musculoskeletal: Strength & Muscle Tone: within normal limits Gait & Station: normal Patient leans: N/A  Psychiatric Specialty Exam: I reviewed physical exam performed in the ER and agree with the findings. Physical Exam  Nursing note and vitals reviewed. Psychiatric: Her speech is normal. Her mood appears anxious. She is slowed and withdrawn. Cognition and memory are normal. She expresses impulsivity. She exhibits a depressed mood. She expresses suicidal ideation. She expresses  suicidal plans.    Review of Systems  Neurological: Positive for tremors.  Psychiatric/Behavioral: Positive for depression and suicidal ideas. The patient is nervous/anxious.   All other systems reviewed and are negative.   Blood pressure (!) 143/78, pulse 90, temperature 99 F (37.2 C), temperature source Oral, resp. rate 18, height  (1.651 m), weight 62.1 kg, SpO2 97 %.Body mass index is 22.8 kg/m.   See SRA    Treatment plan:  Ms. Myhand is a 63 year old female with a history of depresion and mood instability admitted for suicidal ideation with a plan in the context of medication discontinuation and marital problems.   #Suicidal ideation -patient is able to contract for safety in the hospital  #Mood -restart Effexor 150 mg  daily -Trileptal 600 mg daily -Trazodone 100 mg nightly -Abilify 15 mg nightly  #PTSD -Minipress 2 mg BID  #Benzodiazepine dependence -on Xanax in the community, claims to adhere to her once a day schedule -shaky, suggestive of withdrawal, BP elevated -offer Librium taper  #Chronic pain -Meloxicam 15 mg PRN  #HTN -Lisinopril 10 mg daily  #Labs -lipid panel, TSH, A1C -EKG  #Disposition -discharge to home with her husband -follow up with Dr. [name]  Physician Treatment Plan for Primary Diagnosis: <principal problem not specified> Long Term Goal(s): Improvement in symptoms so as ready for discharge  Short Term Goals: Ability to identify changes in lifestyle to reduce recurrence of condition will improve, Ability to verbalize feelings will improve, Ability to disclose and discuss suicidal ideas, Ability to demonstrate self-control will improve, Ability to identify and develop effective coping behaviors will improve, Ability to maintain clinical measurements within normal limits will improve, Compliance with prescribed medications will improve and Ability to identify triggers associated with substance abuse/mental health issues will improve  Physician Treatment Plan for Secondary Diagnosis: Active Problems:   Major depressive disorder, recurrent severe without psychotic features (HCC)   Cannabis use disorder, moderate, dependence (HCC)  Long Term Goal(s): Improvement in symptoms so as ready for discharge  Short Term Goals: NA  I certify that inpatient services furnished can reasonably be expected to improve the patient's condition.    Kristine Linea, MD 1/28/20201:34 PM

## 2018-04-23 NOTE — Progress Notes (Signed)
Recreation Therapy Notes   Date: 04/23/2018  Time: 9:30 am   Location: Craft room   Behavioral response: N/A   Intervention Topic: Communication  Discussion/Intervention: Patient did not attend group.   Clinical Observations/Feedback:  Patient did not attend group.   Rebecca Cairns LRT/CTRS        Hakan Nudelman 04/23/2018 11:10 AM

## 2018-04-23 NOTE — Progress Notes (Signed)
D:Patient refused breakfast  Lunch  Or dinner  But requested a Ensure this am shift. Noted to ambulate to phone system this am  Angry on her return to her room   . Continue to use walker as means of getting about on unit . Noted to complaint about her withdrawal off of Effexor . Writer didn't notice her shaking , phiftatient  Inform of her constant shakes . Patient later sent MHT to  Writer  Indicating she felt faint .DNo auditory hallucinations  No pain concerns . Appropriate ADL'S.  No  Interacting with peers and staff.  No group participation   A: Encourage patient participation with unit programming . Instruction  Given on  Medication , verbalize understanding.  R: Voice no other concerns. Staff continue to monitor
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
## 2018-04-23 NOTE — Progress Notes (Signed)
Initial Nutrition Assessment  DOCUMENTATION CODES:   Not applicable  INTERVENTION:   Ensure Enlive po TID, each supplement provides 350 kcal and 20 grams of protein  MVI daily   NUTRITION DIAGNOSIS:   Inadequate oral intake related to social / environmental circumstances as evidenced by meal completion < 25%.  GOAL:   Patient will meet greater than or equal to 90% of their needs  MONITOR:   PO intake, Supplement acceptance  REASON FOR ASSESSMENT:   Consult Assessment of nutrition requirement/status  ASSESSMENT:   63 year old female with a history of depresion and mood instability admitted for suicidal ideation   Met with pt in room today. Pt reports poor appetite and oral intake r/t medication changes. Pt reports every time her medications change, she looses her appetite. Pt refusing meals today but reports she is willing to drink vanilla Ensure. Per chart, pt appears to have lost 27lbs(17%) over the past year which is not significant given the time frame but worth noting. Recommend continue supplements and MVI until appetite improves.   Medications reviewed and include: vitamin D3, Mg oxide, meloxicam, MVI  Labs reviewed:   Diet Order:   Diet Order            Diet regular Room service appropriate? Yes; Fluid consistency: Thin  Diet effective now             EDUCATION NEEDS:   Education needs have been addressed  Skin:  Skin Assessment: Reviewed RN Assessment  Last BM:  1/26  Height:   Ht Readings from Last 1 Encounters:  04/22/18 _0  (1.651 m)    Weight:   Wt Readings from Last 1 Encounters:  04/22/18 62.1 kg    Ideal Body Weight:  56.8 kg  BMI:  Body mass index is 22.8 kg/m.  Estimated Nutritional Needs:   Kcal:  1500-1700kcal/day   Protein:  62-74g/dy   Fluid:  >1.6L/day   Koleen Distance MS, RD, LDN Pager #- [phone] Office#- [phone] After Hours Pager: [phone]

## 2018-04-23 NOTE — Progress Notes (Signed)
D: Pt denies SI/HI/AVH, can contract for safety. Pt is pleasant and cooperative, engages appropriate during assessments. Pt. Reports that this evening she is feeling much better. Pt. Reports a normal mood. Pt. This shift very isolative and withdrawn resting in her room. Pt. When discussing her admission reports, "I knew I needed help and it was good I came here, but I definitely am feeling better this evening".   A: Q x 15 minute observation checks were completed for safety. Patient was provided with education. Patient was given/offered medications per orders. Patient  was encourage to attend groups, participate in unit activities and continue with plan of care. Pt. Chart and plans of care reviewed. Pt. Given support and encouragement.   R: Patient is complaint with medications when they are brought to her room. Pt. Does not participate in unit activities and groups. Pt. Does not attend snack time. Pt. Given high falls risk education. Pt. Given wheel chair for safety, but refused to use, so given front wheel walker for safety. Pt. Agrees to use front wheel walker.               Precautionary checks every 15 minutes for safety maintained, room free of safety hazards, patient sustains no injury or falls during this shift. Will endorse care to next shift.
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
## 2018-04-23 NOTE — Discharge Instructions (Signed)
Patient transferred to [facility] to continue psychiatric care

## 2018-04-23 NOTE — BHH Group Notes (Signed)
Feelings Around Diagnosis 04/23/2018 1PM  Type of Therapy/Topic:  Group Therapy:  Feelings about Diagnosis  Participation Level:  Did Not Attend   Description of Group:   This group will allow patients to explore their thoughts and feelings about diagnoses they have received. Patients will be guided to explore their level of understanding and acceptance of these diagnoses. Facilitator will encourage patients to process their thoughts and feelings about the reactions of others to their diagnosis and will guide patients in identifying ways to discuss their diagnosis with significant others in their lives. This group will be process-oriented, with patients participating in exploration of their own experiences, giving and receiving support, and processing challenge from other group members.   Therapeutic Goals: 1. Patient will demonstrate understanding of diagnosis as evidenced by identifying two or more symptoms of the disorder 2. Patient will be able to express two feelings regarding the diagnosis 3. Patient will demonstrate their ability to communicate their needs through discussion and/or role play  Summary of Patient Progress:       Therapeutic Modalities:   Cognitive Behavioral Therapy Brief Therapy Feelings Identification    Suzan Slick, LCSW 04/23/2018 2:32 PM

## 2018-04-23 NOTE — Plan of Care (Signed)
Patient  verbalize understanding of information received  [facility]  Education . Emotional and mental status  improving . Voice of no safety concerns . Voice of no safety concerns . Attending unit programing  verbalization of feeling , working on coping skills and decisions making  Problem: Education:  Goal: Knowledge of [facility] Education information/materials will improve Outcome: Progressing Goal: Verbalization of understanding the information provided will improve Outcome: Progressing   Problem: Physical Regulation: Goal: Ability to maintain clinical measurements within normal limits will improve Outcome: Progressing   Problem: Safety: Goal: Periods of time without injury will increase Outcome: Progressing   Problem: Coping: Goal: Coping ability will improve Outcome: Progressing Goal: Will verbalize feelings Outcome: Progressing   Problem: Health Behavior/Discharge Planning: Goal: Ability to make decisions will improve Outcome: Progressing Goal: Compliance with therapeutic regimen will improve Outcome: Progressing   Problem: Role Relationship: Goal: Will demonstrate positive changes in social behaviors and relationships Outcome: Progressing

## 2018-04-23 NOTE — B[facility] Suicide Risk Assessment (Signed)
[facility] INPATIENT:  Family/Significant Other Suicide Prevention Education  Suicide Prevention Education:  Contact Attempts: Kammie Gearheart, husband, [phone] has been identified by the patient as the family member/significant other with whom the patient will be residing, and identified as the person(s) who will aid the patient in the event of a mental health crisis.  With written consent from the patient, two attempts were made to provide suicide prevention education, prior to and/or following the patient's discharge.  We were unsuccessful in providing suicide prevention education.  A suicide education pamphlet was given to the patient to share with family/significant other.  Date and time of first attempt: 04/23/2018 at 11:43am Date and time of second attempt: Second attempt is needed  Harden Mo 04/23/2018, 11:42 AM
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
## 2018-04-23 NOTE — Plan of Care (Signed)
Pt. Complaint with medications. Pt. Denies si/hi/avh, can contract for safety. Pt. Endorses a normal mood. Pt. Reports doing better this evening.    Problem: Education: Goal: Emotional status will improve Outcome: Progressing Goal: Mental status will improve Outcome: Progressing   Problem: Safety: Goal: Periods of time without injury will increase 04/23/2018 0047 by Lenox Ponds, RN Outcome: Progressing 04/23/2018 0040 by Lenox Ponds, RN Outcome: Progressing   Problem: Health Behavior/Discharge Planning: Goal: Compliance with therapeutic regimen will improve 04/23/2018 0047 by Lenox Ponds, RN Outcome: Progressing 04/23/2018 0040 by Lenox Ponds, RN Outcome: Progressing

## 2018-04-24 ENCOUNTER — Other Ambulatory Visit: Payer: Self-pay

## 2018-04-24 MED ORDER — LOPERAMIDE HCL 2 MG PO CAPS
4.0000 mg | ORAL_CAPSULE | ORAL | Status: DC | PRN
  Administered 2018-04-24: 4 mg via ORAL
  Filled 2018-04-24: qty 2

## 2018-04-24 NOTE — BHH Counselor (Signed)
CSW spoke with the patient's husband to confirm the name of the agency he would like for his wife to be referred.  Patient was open to the referral when CSW spoke with her.  CSW provided visiting hours to the husband and let the husband know that the patient is asking that he come for visiting hours to discuss the referral further.   Penni Homans, MSW, LCSW 04/24/2018 9:42 AM

## 2018-04-24 NOTE — Progress Notes (Signed)
[facility] MD Progress Note  04/24/2018 2:42 PM Kara [name]  MRN:  [id number]  Subjective:   Kara [name] met with treatment team today. She feels physically better after Librium taper was started. She did admit that this is from Xanx withdrawal, while yesterday she claimed it was stopping Effexor. VS are stable. She is out of her room participating in programing and interacting with peers and staff appropriately. She insists on using a walker here.  She still feel depressed and has passing suicidal thoghts unsure of the future of her marriage. She tolerates medications well. Appetite is still poor and has nutritiobal supplements per dietary consult.  Spoke with the husband extensively. He will visit tonight. He is in favor of longer hospitalization at [facility] in [place]. The patient seems to agree.  Principal Problem: Major depressive disorder, recurrent severe without psychotic features ([place]) Diagnosis: Principal Problem:   Major depressive disorder, recurrent severe without psychotic features ([place]) Active Problems:   PTSD (post-traumatic stress disorder)   Essential hypertension   Sedative, hypnotic or anxiolytic use disorder, severe, dependence ([place])   Cannabis use disorder, moderate, dependence ([place])   ADD (attention deficit disorder)  Total Time spent with patient: 20 minutes  Past Psychiatric History: depression, mood instability  Past Medical History:  Past Medical History:  Diagnosis Date  . ADD (attention deficit disorder)    on Adderal  . ADD (attention deficit disorder)   . Aggressive behavior of adult   . Anemia   . Anxiety   . Cellulitis of breast 11/2013   RIGHT BREAST  . Childhood asthma   . Chronic fatigue and immune dysfunction syndrome ([place])   . Chronic lower back pain   . Chronic pain    went to [facility] for pain control; stopped in 2016 " (02/23/2017)  . Cold sore   . 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  Degenerative disc disease, lumbar   . Depression    takes meds daily  . Family history of malignant neoplasm of breast   . Fibromyalgia   . Fibromyalgia   . History of kidney stones   . Hypertension   . Osteoporosis   . Osteoporosis   . Other specified rheumatoid arthritis, right shoulder ([place]) 08/01/2011  . Pneumonia 2010?  [name] Post-nasal drip    hx of  . Pre-diabetes   . RA (rheumatoid arthritis) ([facility])    autoimmune arthritis  . RA (rheumatoid arthritis) ([place])   . Suicidal intent     Past Surgical History:  Procedure Laterality Date  . ANTERIOR CERVICAL DECOMP/DISCECTOMY FUSION N/A 03/15/2015   Procedure: Cervical five-six, Cerival six-seven, Anterior Cervical Discectomy and Fusion, Allograft and Plate;  Surgeon: Marybelle Killings, MD;  Location: [place];  Service: Orthopedics;  Laterality: N/A;  . AUGMENTATION MAMMAPLASTY  2003  . BACK SURGERY    . BLADDER SUSPENSION  2009  . BREAST IMPLANT REMOVAL Bilateral 10/2013  . CERVICAL WOUND DEBRIDEMENT N/A 07/07/2016   Procedure: IRRIGATION AND DEBRIDEMENT POSTERIOR NECK;  Surgeon: Marybelle Killings, MD;  Location: [facility];  Service: Orthopedics;  Laterality: N/A;  . COLONOSCOPY    . COMBINED ABDOMINOPLASTY AND LIPOSUCTION  2003  . INCISION AND DRAINAGE ABSCESS Right 01/16/2014   Procedure: INCISION AND DRAINAGE AND OF RIGHT BREAST ABCESS;  Surgeon: Excell Seltzer, MD;  Location: [facility];  Service: General;  Laterality: Right;  . JOINT REPLACEMENT    . LUMBAR LAMINECTOMY/DECOMPRESSION MICRODISCECTOMY N/A 12/10/2015  Procedure: Right L3-4 Hemilaminectomy, Excision of herniated nucleus pulposus;  Surgeon: Marybelle Killings, MD;  Location: [facility];  Service: Orthopedics;  Laterality: N/A;  . MASS EXCISION  11/03/2011   Procedure: MINOR EXCISION OF MASS;  Surgeon: Cammie Sickle., MD;  Location: [facility];  Service: Orthopedics;  Laterality: Left;  debride IP joint, cyst excision left index  . MAXIMUM ACCESS (MAS) TRANSFORAMINAL LUMBAR  INTERBODY FUSION (TLIF) 2 LEVEL Right 02/23/2017  . POSTERIOR CERVICAL FUSION/FORAMINOTOMY N/A 06/21/2016   Procedure: POSTERIOR CERVICAL FUSION C5-C7 SPINOUS PROCESS WIRING;  Surgeon: Marybelle Killings, MD;  Location: [place];  Service: Orthopedics;  Laterality: N/A;  . POSTERIOR LUMBAR FUSION  07/2009; 07/03/2014   "L4-5; L5-S1"  . SHOULDER ARTHROSCOPY Right 2012  . TONSILLECTOMY  1987  . TOTAL SHOULDER ARTHROPLASTY  08/01/2011   Procedure: TOTAL SHOULDER ARTHROPLASTY;  Surgeon: Johnny Bridge, MD;  Location: [place];  Service: Orthopedics;  Laterality: Right;  Right total shoulder arthroplasty  . TUBAL LIGATION  1988   Family History:  Family History  Problem Relation Age of Onset  . Arthritis Mother   . Heart disease Mother        ?psvt  . Breast cancer Mother 7       TAH/BSO  . Cancer Mother   . Mental illness Mother   . COPD Father   . Hypertension Father   . Alcohol abuse Father   . Mental illness Father   . Heart disease Father   . Healthy Daughter   . Breast cancer Maternal Aunt 27       deceased  . Cancer Cousin 41       female; unknown primary  . Colon cancer Paternal Aunt 17       deceased at 58  . Stomach cancer Paternal Uncle 63       deceased at 81  . Alcohol abuse Brother   . Cancer Brother   . Hodgkin's lymphoma Brother   . Mental illness Brother   . HIV Brother   . Healthy Brother   . Healthy Son    Family Psychiatric  History: daughter with BPD Social History:  Social History   Substance and Sexual Activity  Alcohol Use Yes   Comment: 02/23/2017 "might have 1-2 drinks/month"     Social History   Substance and Sexual Activity  Drug Use Yes  . Types: Marijuana   Comment: "i smoke marijuana to avoid opiates"     Social History   Socioeconomic History  . Marital status: Married    Spouse name: Not on file  . Number of children: Not on file  . Years of education: Not on file  . Highest education level: Not on file  Occupational History  . Not on  file  Social Needs  . Financial resource strain: Not on file  . Food insecurity:    Worry: Not on file    Inability: Not on file  . Transportation needs:    Medical: Not on file    Non-medical: Not on file  Tobacco Use  . Smoking status: Former Smoker    Packs/day: 1.00    Years: 10.00    Pack years: 10.00    Types: Cigarettes    Last attempt to quit: 12/06/2005    Years since quitting: 12.3  . Smokeless tobacco: Never Used  Substance and Sexual Activity  . Alcohol use: Yes    Comment: 02/23/2017 "might have 1-2 drinks/month"  . Drug use:  Yes    Types: Marijuana    Comment: "i smoke marijuana to avoid opiates"   . Sexual activity: Yes    Birth control/protection: Post-menopausal  Lifestyle  . Physical activity:    Days per week: Not on file    Minutes per session: Not on file  . Stress: Not on file  Relationships  . Social connections:    Talks on phone: Not on file    Gets together: Not on file    Attends religious service: Not on file    Active member of club or organization: Not on file    Attends meetings of clubs or organizations: Not on file    Relationship status: Not on file  Other Topics Concern  . Not on file  Social History Narrative   Married, Bettye Sitton.    6 children.    [employer], Retired Education officer, museum.    Denies alcohol, tobacco or drug use.   Drinks caffeinated beverages. Uses herbal remedies. Take a daily vitamin.   Wears her seatbelt. Exercises greater than 3 times a week.   Smoke detector in the home, firearms in the home in a locked cabinet, feels safe in her relationships.   Additional Social History:                         Sleep: Fair  Appetite:  Poor  Current Medications: Current Facility-Administered Medications  Medication Dose Route Frequency Provider Last Rate Last Dose  . acetaminophen (TYLENOL) tablet 650 mg  650 mg Oral Q6H PRN Lavella Hammock, MD   650 mg at 04/24/18 1137  . alum & mag hydroxide-simeth (MAALOX/MYLANTA)  200-200-20 MG/5ML suspension 30 mL  30 mL Oral Q4H PRN Lavella Hammock, MD      . ARIPiprazole (ABILIFY) tablet 15 mg  15 mg Oral QHS PRN Lavella Hammock, MD      . chlordiazePOXIDE (LIBRIUM) capsule 25 mg  25 mg Oral QID Mustapha Colson B, MD   25 mg at 04/24/18 1137  . cholecalciferol (VITAMIN D3) tablet 1,000 Units  1,000 Units Oral Daily Lavella Hammock, MD   1,000 Units at 04/24/18 [phone]  . feeding supplement (ENSURE ENLIVE) (ENSURE ENLIVE) liquid 237 mL  237 mL Oral TID BM Maimouna Rondeau B, MD   237 mL at 04/24/18 1100  . hydrOXYzine (ATARAX/VISTARIL) tablet 25 mg  25 mg Oral Q4H PRN Lavella Hammock, MD   25 mg at 04/23/18 1731  . lisinopril (PRINIVIL,ZESTRIL) tablet 10 mg  10 mg Oral Daily Lavella Hammock, MD   10 mg at 04/24/18 0820  . magnesium hydroxide (MILK OF MAGNESIA) suspension 30 mL  30 mL Oral Daily PRN Lavella Hammock, MD      . magnesium oxide (MAG-OX) tablet 400 mg  400 mg Oral Daily Lavella Hammock, MD   400 mg at 04/24/18 0820  . meloxicam (MOBIC) tablet 15 mg  15 mg Oral Daily Lavella Hammock, MD   15 mg at 04/24/18 7124  . multivitamin with minerals tablet 1 tablet  1 tablet Oral Daily Faustine Tates B, MD   1 tablet at 04/24/18 0823  . Oxcarbazepine (TRILEPTAL) tablet 600 mg  600 mg Oral Daily Lavella Hammock, MD   600 mg at 04/24/18 5809  . prazosin (MINIPRESS) capsule 2 mg  2 mg Oral Daily Lavella Hammock, MD   2 mg at 04/24/18 9833  . traZODone (DESYREL) tablet 100 mg  100  mg Oral QHS PRN Verlyn Dannenberg B, MD   100 mg at 04/23/18 2153  . venlafaxine XR (EFFEXOR-XR) 24 hr capsule 150 mg  150 mg Oral Q breakfast Yaser Harvill B, MD   150 mg at 04/24/18 1062    Lab Results:  Results for orders placed or performed during the hospital encounter of 04/22/18 (from the past 48 hour(s))  Rapid urine drug screen (hospital performed)     Status: Abnormal   Collection Time: 04/22/18  2:59 PM  Result Value Ref Range   Opiates POSITIVE (A) NONE  DETECTED   Cocaine NONE DETECTED NONE DETECTED   Benzodiazepines POSITIVE (A) NONE DETECTED   Amphetamines POSITIVE (A) NONE DETECTED   Tetrahydrocannabinol POSITIVE (A) NONE DETECTED   Barbiturates NONE DETECTED NONE DETECTED    Comment: (NOTE) DRUG SCREEN FOR MEDICAL PURPOSES ONLY.  IF CONFIRMATION IS NEEDED FOR ANY PURPOSE, NOTIFY LAB WITHIN 5 DAYS. LOWEST DETECTABLE LIMITS FOR URINE DRUG SCREEN Drug Class                     Cutoff (ng/mL) Amphetamine and metabolites    1000 Barbiturate and metabolites    200 Benzodiazepine                 694 Tricyclics and metabolites     300 Opiates and metabolites        300 Cocaine and metabolites        300 THC                            50 Performed at [facility], [place] [address], [place] [address], [place] [address]     Blood Alcohol level:  Lab Results  Component Value Date   ETH 99 (H) 04/22/2018   ETH <10 70/35/0093    Metabolic Disorder Labs: Lab Results  Component Value Date   HGBA1C 5.9 (H) 02/21/2017   MPG 122.63 02/21/2017   MPG 120 12/14/2015   No results found for: PROLACTIN Lab Results  Component Value Date   CHOL 278 (H) 08/18/2015   TRIG 182.0 (H) 08/18/2015   HDL 64.30 08/18/2015   CHOLHDL 4 08/18/2015   VLDL 36.4 08/18/2015   LDLCALC 178 (H) 08/18/2015   LDLCALC 121 (H) 08/01/2013    Physical Findings: AIMS:  , ,  ,  ,    CIWA:    COWS:     Musculoskeletal: Strength & Muscle Tone: within normal limits Gait & Station: normal Patient leans: N/A  Psychiatric Specialty Exam: Physical Exam  Nursing note and vitals reviewed. Psychiatric: Her speech is normal and behavior is normal. Her mood appears anxious. Cognition and memory are impaired. She expresses impulsivity. She exhibits a depressed mood. She expresses suicidal ideation.    Review of Systems  Neurological: Negative.   Psychiatric/Behavioral: Positive for depression, substance abuse and suicidal ideas.  All other systems reviewed and are  negative.   Blood pressure 123/72, pulse 81, temperature 98.6 F (37 C), temperature source Oral, resp. rate 16, height '5\' 5"'  (1.651 m), weight 62.1 kg, SpO2 98 %.Body mass index is 22.8 kg/m.  General Appearance: Casual  Eye Contact:  Good  Speech:  Clear and Coherent  Volume:  Normal  Mood:  Anxious, Depressed and Worthless  Affect:  Flat  Thought Process:  Goal Directed and Descriptions of Associations: Intact  Orientation:  Full (Time, Place, and Person)  Thought Content:  WDL  Suicidal Thoughts:  Yes.  without intent/plan  Homicidal Thoughts:  No  Memory:  Immediate;   Fair Recent;   Fair Remote;   Fair  Judgement:  Impaired  Insight:  Shallow  Psychomotor Activity:  Decreased  Concentration:  Concentration: Fair and Attention Span: Fair  Recall:  [employer] of Knowledge:  Fair  Language:  Fair  Akathisia:  No  Handed:  Right  AIMS (if indicated):     Assets:  Communication Skills Desire for Improvement Financial Resources/Insurance Housing Resilience Social Support  ADL's:  Intact  Cognition:  WNL  Sleep:  Number of Hours: 4.5     Treatment Plan Summary: Daily contact with patient to assess and evaluate symptoms and progress in treatment and Medication management   Ms. Bertucci is a 63 year old female with a history of depresion and mood instability admitted for suicidal ideation with a plan in the context of medication discontinuation and marital problems.   #Suicidal ideation -patient is able to contract for safety in the hospital  #Mood -restart Effexor 150 mg daily -Trileptal 600 mg daily -Trazodone 100 mg nightly -Abilify 15 mg nightly  #PTSD -Minipress 2 mg BID  #Benzodiazepine dependence -on Xanax in the community, claims to adhere to her once a day schedule -shaky, suggestive of withdrawal, BP elevated -offer Librium taper  #Substance abuse -positive for benzodiazepine, opioids and cannabis -minimizes problems -agreable to [facility] long  term treatment participation that includes substance abuse  #Chronic pain -Meloxicam 15 mg PRN  #HTN -Lisinopril 10 mg daily  #Weight loss -dietary consult is appreciated -Ensure TID, MVI  #Deconditioning -uses a walker in the hospital  #Labs -lipid panel, TSH, A1C -EKG  #Disposition -longer term or partial hospitalization recommended -discharge to home with her husband -follow up with Dr. Coralie Common, MD 04/24/2018, 2:42 PM

## 2018-04-24 NOTE — BHH Counselor (Signed)
CSW attempted to contact [facility] to make referral.  CSW had to leave message requesting a return call.    [phone]  Penni Homans, MSW, LCSW 04/24/2018 9:44 AM

## 2018-04-24 NOTE — BHH Counselor (Signed)
CSW contacted the patient's husband, Lameisha Duy [phone], at the patient's request.  CSW informed that patient is requesting a sweater/hoodie, beanie hat and a pair of socks.  CSW provided the list to the patient's husband and explained that the socks would most likely not be accepted.    Husband reports that he will visit patient this evening and bring the requested items.   Penni Homans, MSW, LCSW 04/24/2018 2:53 PM

## 2018-04-24 NOTE — Plan of Care (Signed)
D- Patient alert and oriented. Patient presents in a pleasant mood on assessment stating that last night was the "first time I've slept and had a dream". Patient rated her lower back pain "10/10" stating that she has "lower back pain and two bulging discs". Patient denied depression, however, she rated her anxiety a "4/10" stating "a little bit because I have the shakes". Patient also denied SI, HI, AVH, at this time. Patient's goal for today is to "continue coming to the med room and interact with others".  A- Scheduled medications administered to patient, per MD orders. Support and encouragement provided.  Routine safety checks conducted every 15 minutes.  Patient informed to notify staff with problems or concerns.  R- No adverse drug reactions noted. Patient contracts for safety at this time. Patient compliant with medications and treatment plan. Patient receptive, calm, and cooperative. Patient interacts well with others on the unit.  Patient remains safe at this time.   Problem: Education: Goal: Knowledge of [facility] Education information/materials will improve Outcome: Progressing Goal: Emotional status will improve Outcome: Progressing Goal: Mental status will improve Outcome: Progressing Goal: Verbalization of understanding the information provided will improve Outcome: Progressing   Problem: Physical Regulation: Goal: Ability to maintain clinical measurements within normal limits will improve Outcome: Progressing   Problem: Safety: Goal: Periods of time without injury will increase Outcome: Progressing   Problem: Coping: Goal: Coping ability will improve Outcome: Progressing Goal: Will verbalize feelings Outcome: Progressing   Problem: Health Behavior/Discharge Planning: Goal: Ability to make decisions will improve Outcome: Progressing Goal: Compliance with therapeutic regimen will improve Outcome: Progressing   Problem: Role Relationship: Goal: Will  demonstrate positive changes in social behaviors and relationships Outcome: Progressing

## 2018-04-24 NOTE — [facility] Group Notes (Signed)
LCSW Group Therapy Note  04/24/2018 1:00 PM  Type of Therapy/Topic:  Group Therapy:  Emotion Regulation  Participation Level:  Active   Description of Group:   The purpose of this group is to assist patients in learning to regulate negative emotions and experience positive emotions. Patients will be guided to discuss ways in which they have been vulnerable to their negative emotions. These vulnerabilities will be juxtaposed with experiences of positive emotions or situations, and patients will be challenged to use positive emotions to combat negative ones. Special emphasis will be placed on coping with negative emotions in conflict situations, and patients will process healthy conflict resolution skills.  Therapeutic Goals: 1. Patient will identify two positive emotions or experiences to reflect on in order to balance out negative emotions 2. Patient will label two or more emotions that they find the most difficult to experience 3. Patient will demonstrate positive conflict resolution skills through discussion and/or role plays  Summary of Patient Progress: Patient was an active participant in group.  Patient was supportive of other group members.  Patient discussed how cultural differences can negatively impact a relationship.  Patient discussed how negative feelings that he has felt recently has been "sadness and unloved".  Patient discussed how he felt that the world needed to treat others with more kindness.  Patient also discussed positive emotions that she has experienced as well.  Patient engaged in discussion on effective coping skills: including dance, music.    Therapeutic Modalities:   Cognitive Behavioral Therapy Feelings Identification Dialectical Behavioral Therapy  Kara [name], MSW, LCSW 04/24/2018 2:32 PM

## 2018-04-24 NOTE — Progress Notes (Signed)
Recreation Therapy Notes  INPATIENT RECREATION THERAPY ASSESSMENT  Patient Details Name: Kara [name] MRN: [id number] DOB: 1955/08/18 Today's Date: 04/24/2018       Information Obtained From: Patient  Able to Participate in Assessment/Interview: Yes  Patient Presentation: Responsive  Reason for Admission (Per Patient): Active Symptoms, Suicidal Ideation, Med Non-Compliance  Patient Stressors: Relationship  Coping Skills:   Music, Other (Comment)(Medication)  Leisure Interests (2+):  Social - Family  Frequency of Recreation/Participation: Monthly  Awareness of Community Resources:  Yes  Community Resources:  Gym  Current Use:    If no, Barriers?:    Expressed Interest in [employer] Corporation[employer] Resource Information:    [place]  Patient Main Form of Transportation: Set [employer]Car  Patient Strengths:  Kind,Respectful, Insightful  Patient Identified Areas of Improvement:  Not to take things personally  Patient Goal for Hospitalization:  Getting out of my room more  Current SI (including self-harm):  No  Current HI:  No  Current AVH: No  Staff Intervention Plan: Group Attendance, Collaborate with Interdisciplinary Treatment Team  Consent to Intern Participation: N/A  Kara [name] 04/24/2018, 3:44 PM

## 2018-04-24 NOTE — Progress Notes (Addendum)
[facility]  04/25/2018 12:57 PM Kara [name]  MRN:  [id number]  Subjective:    [name] "is in a good place" today. Denies any symptoms of withdrawal. Tolerated medications well. She is not suicidal or homicidal. Discharge tomorrow.  Patient interested in "outpatient ECT" that she found helpful in the past. She will be going to partial hospitalization program and needs two surgeries before it could happen. Will ask Dr. Toni [name] for an opinion.  Spoke with the husband who is negotiating with [facility] program. Unfortunately, we will not be able to performed two MRI tests prescribed by her neurosurgeon while in the hospital.  Principal Problem: Major depressive disorder, recurrent severe without psychotic features (HCC) Diagnosis: Principal Problem:   Major depressive disorder, recurrent severe without psychotic features (HCC) Active Problems:   PTSD (post-traumatic stress disorder)   Opioid use disorder, moderate, dependence (HCC)   Essential hypertension   Sedative, hypnotic or anxiolytic use disorder, severe, dependence (HCC)   Cannabis use disorder, moderate, dependence (HCC)   ADD (attention deficit disorder)  Total Time spent with patient: 20 minutes  Past Psychiatric History: mood instability  Past Medical History:  Past Medical History:  Diagnosis Date  . ADD (attention deficit disorder)    on Adderal  . ADD (attention deficit disorder)   . Aggressive behavior of adult   . Anemia   . Anxiety   . Cellulitis of breast 11/2013   RIGHT BREAST  . Childhood asthma   . 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 Chronic lower back pain   . Chronic pain    went to [facility] for pain control; stopped in 2016 " (02/23/2017)  . Cold sore   . 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 Degenerative disc disease, lumbar   . Depression    takes meds daily  . Family history of malignant neoplasm of breast   .  Fibromyalgia   . Fibromyalgia   . History of kidney stones   . Hypertension   . Osteoporosis   . Osteoporosis   . Other specified rheumatoid arthritis, right shoulder (HCC) 08/01/2011  . Pneumonia 2010?  [name]. Post-nasal drip    hx of  . Pre-diabetes   . RA (rheumatoid arthritis) (HCC)    autoimmune arthritis  . RA (rheumatoid arthritis) (HCC)   . Suicidal intent     Past Surgical History:  Procedure Laterality Date  . ANTERIOR CERVICAL DECOMP/DISCECTOMY FUSION N/A 03/15/2015   Procedure: Cervical five-six, Cerival six-seven, Anterior Cervical Discectomy and Fusion, Allograft and Plate;  Surgeon: Eldred MangesMark C Yates, MD;  Location: [facility] OR;  Service: Orthopedics;  Laterality: N/A;  . AUGMENTATION MAMMAPLASTY  2003  . BACK SURGERY    . BLADDER SUSPENSION  2009  . BREAST IMPLANT REMOVAL Bilateral 10/2013  . CERVICAL WOUND DEBRIDEMENT N/A 07/07/2016   Procedure: IRRIGATION AND DEBRIDEMENT POSTERIOR NECK;  Surgeon: Eldred MangesMark C Yates, MD;  Location: [facility] OR;  Service: Orthopedics;  Laterality: N/A;  . COLONOSCOPY    . COMBINED ABDOMINOPLASTY AND LIPOSUCTION  2003  . INCISION AND DRAINAGE ABSCESS Right 01/16/2014   Procedure: INCISION AND DRAINAGE AND OF RIGHT BREAST ABCESS;  Surgeon: Glenna FellowsBenjamin Hoxworth, MD;  Location: [facility];  Service: General;  Laterality: Right;  . JOINT REPLACEMENT    . LUMBAR LAMINECTOMY/DECOMPRESSION MICRODISCECTOMY N/A 12/10/2015   Procedure: Right L3-4 Hemilaminectomy, Excision of herniated nucleus pulposus;  Surgeon: Eldred MangesMark C Yates, MD;  [facility];  Service: Orthopedics;  Laterality: N/A;  . MASS EXCISION  11/03/2011   Procedure: MINOR EXCISION OF MASS;  Surgeon: Wyn Forster., MD;  Location: [facility];  Service: Orthopedics;  Laterality: Left;  debride IP joint, cyst excision left index  . MAXIMUM ACCESS (MAS) TRANSFORAMINAL LUMBAR INTERBODY FUSION (TLIF) 2 LEVEL Right 02/23/2017  . POSTERIOR CERVICAL FUSION/FORAMINOTOMY N/A 06/21/2016   Procedure: POSTERIOR  CERVICAL FUSION C5-C7 SPINOUS PROCESS WIRING;  Surgeon: Eldred Manges, MD;  Location: [facility] OR;  Service: Orthopedics;  Laterality: N/A;  . POSTERIOR LUMBAR FUSION  07/2009; 07/03/2014   "L4-5; L5-S1"  . SHOULDER ARTHROSCOPY Right 2012  . TONSILLECTOMY  1987  . TOTAL SHOULDER ARTHROPLASTY  08/01/2011   Procedure: TOTAL SHOULDER ARTHROPLASTY;  Surgeon: Eulas Post, MD;  Location: [facility] OR;  Service: Orthopedics;  Laterality: Right;  Right total shoulder arthroplasty  . TUBAL LIGATION  1988   Family History:  Family History  Problem Relation Age of Onset  . Arthritis Mother   . Heart disease Mother        ?psvt  . Breast cancer Mother 77       TAH/BSO  . Cancer Mother   . Mental illness Mother   . COPD Father   . Hypertension Father   . Alcohol abuse Father   . Mental illness Father   . Heart disease Father   . Healthy Daughter   . Breast cancer Maternal Aunt 27       deceased  . Cancer Cousin 62       female; unknown primary  . Colon cancer Paternal Aunt 103       deceased at 42  . Stomach cancer Paternal Uncle 39       deceased at 35  . Alcohol abuse Brother   . Cancer Brother   . Hodgkin's lymphoma Brother   . Mental illness Brother   . HIV Brother   . Healthy Brother   . Healthy Son    Family Psychiatric  History: daugher with borderline PD Social History:  Social History   Substance and Sexual Activity  Alcohol Use Yes   Comment: 02/23/2017 "might have 1-2 drinks/month"     Social History   Substance and Sexual Activity  Drug Use Yes  . Types: Marijuana   Comment: "i smoke marijuana to avoid opiates"     Social History   Socioeconomic History  . Marital status: Married    Spouse name: Not on file  . Number of children: Not on file  . Years of education: Not on file  . Highest education level: Not on file  Occupational History  . Not on file  Social Needs  . Financial resource strain: Not on file  . Food insecurity:    Worry: Not on file    Inability:  Not on file  . Transportation needs:    Medical: Not on file    Non-medical: Not on file  Tobacco Use  . Smoking status: Former Smoker    Packs/day: 1.00    Years: 10.00    Pack years: 10.00    Types: Cigarettes    Last attempt to quit: 12/06/2005    Years since quitting: 12.3  . Smokeless tobacco: Never Used  Substance and Sexual Activity  . Alcohol use: Yes    Comment: 02/23/2017 "might have 1-2 drinks/month"  . Drug use: Yes    Types: Marijuana    Comment: "i smoke marijuana to avoid opiates"   .  Sexual activity: Yes    Birth control/protection: Post-menopausal  Lifestyle  . Physical activity:    Days per week: Not on file    Minutes per session: Not on file  . Stress: Not on file  Relationships  . Social connections:    Talks on phone: Not on file    Gets together: Not on file    Attends religious service: Not on file    Active member of club or organization: Not on file    Attends meetings of clubs or organizations: Not on file    Relationship status: Not on file  Other Topics Concern  . Not on file  Social History Narrative   Married, Verda Mehta.    6 children.    [employer], Retired Child psychotherapist.    Denies alcohol, tobacco or drug use.   Drinks caffeinated beverages. Uses herbal remedies. Take a daily vitamin.   Wears her seatbelt. Exercises greater than 3 times a week.   Smoke detector in the home, firearms in the home in a locked cabinet, feels safe in her relationships.   Additional Social History:                         Sleep: Poor  Appetite:  Poor  Current Medications: Current Facility-Administered Medications  Medication Dose Route Frequency Provider Last Rate Last Dose  . acetaminophen (TYLENOL) tablet 650 mg  650 mg Oral Q6H PRN Mariel Craft, MD   650 mg at 04/25/18 0620  . alum & mag hydroxide-simeth (MAALOX/MYLANTA) 200-200-20 MG/5ML suspension 30 mL  30 mL Oral Q4H PRN Mariel Craft, MD      . ARIPiprazole (ABILIFY) tablet 15 mg  15  mg Oral QHS PRN Mariel Craft, MD      . chlordiazePOXIDE (LIBRIUM) capsule 25 mg  25 mg Oral QID Pucilowska, Jolanta B, MD   25 mg at 04/25/18 1109  . cholecalciferol (VITAMIN D3) tablet 1,000 Units  1,000 Units Oral Daily Mariel Craft, MD   1,000 Units at 04/25/18 [phone]  . feeding supplement (ENSURE ENLIVE) (ENSURE ENLIVE) liquid 237 mL  237 mL Oral TID BM Pucilowska, Jolanta B, MD   237 mL at 04/24/18 2137  . hydrOXYzine (ATARAX/VISTARIL) tablet 25 mg  25 mg Oral Q4H PRN Mariel Craft, MD   25 mg at 04/25/18 9604  . lisinopril (PRINIVIL,ZESTRIL) tablet 10 mg  10 mg Oral Daily Mariel Craft, MD   10 mg at 04/25/18 5409  . loperamide (IMODIUM) capsule 4 mg  4 mg Oral PRN Pucilowska, Jolanta B, MD   4 mg at 04/24/18 2136  . magnesium hydroxide (MILK OF MAGNESIA) suspension 30 mL  30 mL Oral Daily PRN Mariel Craft, MD      . magnesium oxide (MAG-OX) tablet 400 mg  400 mg Oral Daily Mariel Craft, MD   400 mg at 04/25/18 8119  . meloxicam (MOBIC) tablet 15 mg  15 mg Oral Daily Mariel Craft, MD   15 mg at 04/25/18 1478  . multivitamin with minerals tablet 1 tablet  1 tablet Oral Daily Pucilowska, Jolanta B, MD   1 tablet at 04/25/18 0837  . Oxcarbazepine (TRILEPTAL) tablet 600 mg  600 mg Oral Daily Mariel Craft, MD   600 mg at 04/25/18 2956  . prazosin (MINIPRESS) capsule 2 mg  2 mg Oral Daily Mariel Craft, MD   2 mg at 04/25/18 2130  . traZODone (DESYREL) tablet  100 mg  100 mg Oral QHS PRN Pucilowska, Jolanta B, MD   100 mg at 04/24/18 2137  . venlafaxine XR (EFFEXOR-XR) 24 hr capsule 150 mg  150 mg Oral Q breakfast Pucilowska, Jolanta B, MD   150 mg at 04/25/18 9604    Lab Results: No results found for this or any previous visit (from the past 48 hour(s)).  Blood Alcohol level:  Lab Results  Component Value Date   ETH 99 (H) 04/22/2018   ETH <10 02/26/2018    Metabolic Disorder Labs: Lab Results  Component Value Date   HGBA1C 5.9 (H) 02/21/2017   MPG 122.63  02/21/2017   MPG 120 12/14/2015   No results found for: PROLACTIN Lab Results  Component Value Date   CHOL 278 (H) 08/18/2015   TRIG 182.0 (H) 08/18/2015   HDL 64.30 08/18/2015   CHOLHDL 4 08/18/2015   VLDL 36.4 08/18/2015   LDLCALC 178 (H) 08/18/2015   LDLCALC 121 (H) 08/01/2013    Physical Findings: AIMS:  , ,  ,  ,    CIWA:    COWS:     Musculoskeletal: Strength & Muscle Tone: within normal limits Gait & Station: normal Patient leans: N/A  Psychiatric Specialty Exam: Physical Exam  Nursing note and vitals reviewed. Psychiatric: Her speech is normal. Thought content normal. Her mood appears anxious. She is withdrawn. Cognition and memory are normal. She expresses impulsivity. She exhibits a depressed mood.    Review of Systems  Gastrointestinal: Positive for diarrhea.  Neurological: Negative.   Psychiatric/Behavioral: Positive for substance abuse.  All other systems reviewed and are negative.   Blood pressure (!) 142/91, pulse 84, temperature 99.4 F (37.4 C), temperature source Oral, resp. rate 16, height  (1.651 m), weight 62.1 kg, SpO2 96 %.Body mass index is 22.8 kg/m.  General Appearance: Casual  Eye Contact:  Good  Speech:  Clear and Coherent  Volume:  Normal  Mood:  Anxious and Depressed  Affect:  Appropriate  Thought Process:  Goal Directed and Descriptions of Associations: Intact  Orientation:  Full (Time, Place, and Person)  Thought Content:  WDL  Suicidal Thoughts:  No  Homicidal Thoughts:  No  Memory:  Negative Immediate;   Fair Recent;   Fair Remote;   Fair  Judgement:  Poor  Insight:  Lacking  Psychomotor Activity:  Normal  Concentration:  Concentration: Fair and Attention Span: Fair  Recall:  [employer] of Knowledge:  Fair  Language:  Fair  Akathisia:  No  Handed:  Right  AIMS (if indicated):     Assets:  Communication Skills Desire for Improvement Financial Resources/Insurance Housing Physical Health Resilience Social  Support  ADL's:  Intact  Cognition:  WNL  Sleep:  Number of Hours: 7     Treatment Plan Summary: Daily contact with patient to assess and evaluate symptoms and progress in treatment and Medication management   Ms. Truett is a 63 year old female with a history of depresion and mood instability admitted for suicidal ideation with a plan in the context of medication discontinuation and marital problems.   #Suicidal ideation -patient is able to contract for safety in the hospital  #Mood -restart Effexor 150 mg daily -Trileptal 600 mg daily -Trazodone 100 mg nightly -Abilify 15 mg nightly  #PTSD -Minipress 2 mg BID  #Benzodiazepine dependence -on Xanax in the community, claims to adhere to her once a day schedule -shaky, suggestive of withdrawal, BP elevated -offer Librium taper  #Substance abuse -positive for benzodiazepine,  opioids and cannabis -minimizes problems -agreable to [facility] long term treatment participation that includes substance abuse  #Chronic pain -Meloxicam 15 mg PRN  #HTN -Lisinopril 10 mg daily  #Weight loss -dietary consult is appreciated -Ensure TID, MVI  #Deconditioning -uses a walker in the hospital  #Labs -lipid panel, TSH, A1C -EKG  #Disposition -longer term or partial hospitalization recommended -discharge to home with her husband -follow up with Dr. Mindi CurlingKaur  Jolanta Pucilowska, MD 04/25/2018, 12:57 PM

## 2018-04-24 NOTE — Tx Team (Addendum)
Interdisciplinary Treatment and Diagnostic Plan Update  04/24/2018 Time of Session: 10:30AM Kara DoloresBarbara Vanderpool MRN: [id number]  Principal Diagnosis: Major depressive disorder, recurrent severe without psychotic features (HCC)  Secondary Diagnoses: Principal Problem:   Major depressive disorder, recurrent severe without psychotic features (HCC) Active Problems:   PTSD (post-traumatic stress disorder)   Essential hypertension   Sedative, hypnotic or anxiolytic use disorder, severe, dependence (HCC)   Cannabis use disorder, moderate, dependence (HCC)   ADD (attention deficit disorder)   Current Medications:  Current Facility-Administered Medications  Medication Dose Route Frequency Provider Last Rate Last Dose  . acetaminophen (TYLENOL) tablet 650 mg  650 mg Oral Q6H PRN Mariel CraftMaurer, Sheila M, MD   650 mg at 04/24/18 0526  . alum & mag hydroxide-simeth (MAALOX/MYLANTA) 200-200-20 MG/5ML suspension 30 mL  30 mL Oral Q4H PRN Mariel CraftMaurer, Sheila M, MD      . ARIPiprazole (ABILIFY) tablet 15 mg  15 mg Oral QHS PRN Mariel CraftMaurer, Sheila M, MD      . chlordiazePOXIDE (LIBRIUM) capsule 25 mg  25 mg Oral QID Pucilowska, Jolanta B, MD   25 mg at 04/24/18 0823  . cholecalciferol (VITAMIN D3) tablet 1,000 Units  1,000 Units Oral Daily Mariel CraftMaurer, Sheila M, MD   1,000 Units at 04/24/18 [phone]  . feeding supplement (ENSURE ENLIVE) (ENSURE ENLIVE) liquid 237 mL  237 mL Oral TID BM Pucilowska, Jolanta B, MD   237 mL at 04/23/18 2145  . hydrOXYzine (ATARAX/VISTARIL) tablet 25 mg  25 mg Oral Q4H PRN Mariel CraftMaurer, Sheila M, MD   25 mg at 04/23/18 1731  . lisinopril (PRINIVIL,ZESTRIL) tablet 10 mg  10 mg Oral Daily Mariel CraftMaurer, Sheila M, MD   10 mg at 04/24/18 0820  . magnesium hydroxide (MILK OF MAGNESIA) suspension 30 mL  30 mL Oral Daily PRN Mariel CraftMaurer, Sheila M, MD      . magnesium oxide (MAG-OX) tablet 400 mg  400 mg Oral Daily Mariel CraftMaurer, Sheila M, MD   400 mg at 04/24/18 0820  . meloxicam (MOBIC) tablet 15 mg  15 mg Oral Daily Mariel CraftMaurer, Sheila M, MD    15 mg at 04/24/18 [id number]  . multivitamin with minerals tablet 1 tablet  1 tablet Oral Daily Pucilowska, Jolanta B, MD   1 tablet at 04/24/18 0823  . Oxcarbazepine (TRILEPTAL) tablet 600 mg  600 mg Oral Daily Mariel CraftMaurer, Sheila M, MD   600 mg at 04/24/18 [id number]  . prazosin (MINIPRESS) capsule 2 mg  2 mg Oral Daily Mariel CraftMaurer, Sheila M, MD   2 mg at 04/24/18 [id number]  . traZODone (DESYREL) tablet 100 mg  100 mg Oral QHS PRN Pucilowska, Jolanta B, MD   100 mg at 04/23/18 2153  . venlafaxine XR (EFFEXOR-XR) 24 hr capsule 150 mg  150 mg Oral Q breakfast Pucilowska, Jolanta B, MD   150 mg at 04/24/18 [id number]   PTA Medications: Medications Prior to Admission  Medication Sig Dispense Refill Last Dose  . acetaminophen-codeine (TYLENOL #3) 300-30 MG tablet Take 1 tablet by mouth every 8 (eight) hours as needed for moderate pain. (Patient not taking: Reported on 02/02/2018) 20 tablet 0 Completed Course at Unknown time  . alendronate (FOSAMAX) 35 MG tablet Take 1 tablet (35 mg total) by mouth every 7 (seven) days. Take with a full glass of water on an empty stomach. 12 tablet 0 04/15/2018 at Unknown time  . ALPRAZolam (XANAX) 1 MG tablet Take 1 mg by mouth 2 (two) times daily as needed for anxiety.   0 Past Week at Unknown  time  . amphetamine-dextroamphetamine (ADDERALL XR) 30 MG 24 hr capsule Take 60 mg by mouth daily.   0 04/21/2018 at Unknown time  . amphetamine-dextroamphetamine (ADDERALL) 30 MG tablet Take 30 mg by mouth daily at 3 pm.   0 04/21/2018 at Unknown time  . ARIPiprazole (ABILIFY) 15 MG tablet Take 15 mg by mouth at bedtime as needed (depression).   12 Past Week at Unknown time  . cholecalciferol (VITAMIN D) 1000 units tablet Take 1,000 Units by mouth daily.   04/21/2018 at Unknown time  . diclofenac sodium (VOLTAREN) 1 % GEL Apply 1 application topically daily as needed for pain.   Past Month at Unknown time  . divalproex (DEPAKOTE ER) 500 MG 24 hr tablet Take 1 tablet (500 mg total) by mouth 2 (two) times daily at 8  am and 10 pm. (Patient not taking: Reported on 02/02/2018) 60 tablet 1 Not Taking at Unknown time  . haloperidol (HALDOL) 10 MG tablet Take 1 tablet (10 mg total) by mouth every 6 (six) hours as needed for agitation. (Patient not taking: Reported on 09/18/2017) 30 tablet 1 Not Taking at Unknown time  . HYDROcodone-acetaminophen (NORCO) 5-325 MG tablet Take 1 tablet by mouth every 6 (six) hours as needed for severe pain. (Patient not taking: Reported on 08/27/2017) 10 tablet 0 Completed Course at Unknown time  . lisinopril (PRINIVIL,ZESTRIL) 10 MG tablet TAKE 1 AND 1/2 TABLETS DAILY BY MOUTH (Patient taking differently: Take 10 mg by mouth daily. ) 45 tablet 1 04/21/2018 at Unknown time  . MAGNESIUM PO Take 1 tablet by mouth daily.   04/21/2018 at Unknown time  . meloxicam (MOBIC) 15 MG tablet TAKE 1 TABLET (15 MG) BY MOUTH EVERY DAY (Patient taking differently: Take 15 mg by mouth daily. ) 30 tablet 1 04/21/2018 at Unknown time  . methocarbamol (ROBAXIN) 500 MG tablet Take 1 tablet (500 mg total) by mouth 2 (two) times daily. 10 tablet 0 04/21/2018 at Unknown time  . oxcarbazepine (TRILEPTAL) 600 MG tablet Take 600 mg by mouth daily.    04/21/2018 at Unknown time  . oxyCODONE-acetaminophen (PERCOCET/ROXICET) 5-325 MG tablet Take 1 tablet by mouth every 6 (six) hours as needed for severe pain. (Patient not taking: Reported on 04/22/2018) 8 tablet 0 Completed Course at Unknown time  . prazosin (MINIPRESS) 2 MG capsule Take 1 capsule (2 mg total) by mouth 2 (two) times daily. (Patient taking differently: Take 2 mg by mouth daily. ) 60 capsule 1 04/21/2018 at Unknown time  . tretinoin (RETIN-A) 0.1 % cream Apply 1 application topically at bedtime as needed (facial blemishes).   3 Past Month at Unknown time  . venlafaxine XR (EFFEXOR-XR) 75 MG 24 hr capsule Take 75 mg by mouth daily with breakfast.    04/21/2018 at Unknown time  . zolpidem (AMBIEN) 10 MG tablet Take 10 mg by mouth at bedtime.  2 04/20/2018 at Unknown time     Patient Stressors: Health problems Marital or family conflict Traumatic event  Patient Strengths: Average or above average intelligence Communication skills Supportive family/friends  Treatment Modalities: Medication Management, Group therapy, Case management,  1 to 1 session with clinician, Psychoeducation, Recreational therapy.   Physician Treatment Plan for Primary Diagnosis: Major depressive disorder, recurrent severe without psychotic features (HCC) Long Term Goal(s): Improvement in symptoms so as ready for discharge Improvement in symptoms so as ready for discharge   Short Term Goals: Ability to identify changes in lifestyle to reduce recurrence of condition will improve Ability to  verbalize feelings will improve Ability to disclose and discuss suicidal ideas Ability to demonstrate self-control will improve Ability to identify and develop effective coping behaviors will improve Ability to maintain clinical measurements within normal limits will improve Compliance with prescribed medications will improve Ability to identify triggers associated with substance abuse/mental health issues will improve NA  Medication Management: Evaluate patient's response, side effects, and tolerance of medication regimen.  Therapeutic Interventions: 1 to 1 sessions, Unit Group sessions and Medication administration.  Evaluation of Outcomes: Progressing  Physician Treatment Plan for Secondary Diagnosis: Principal Problem:   Major depressive disorder, recurrent severe without psychotic features (HCC) Active Problems:   PTSD (post-traumatic stress disorder)   Essential hypertension   Sedative, hypnotic or anxiolytic use disorder, severe, dependence (HCC)   Cannabis use disorder, moderate, dependence (HCC)   ADD (attention deficit disorder)  Long Term Goal(s): Improvement in symptoms so as ready for discharge Improvement in symptoms so as ready for discharge   Short Term Goals: Ability  to identify changes in lifestyle to reduce recurrence of condition will improve Ability to verbalize feelings will improve Ability to disclose and discuss suicidal ideas Ability to demonstrate self-control will improve Ability to identify and develop effective coping behaviors will improve Ability to maintain clinical measurements within normal limits will improve Compliance with prescribed medications will improve Ability to identify triggers associated with substance abuse/mental health issues will improve NA     Medication Management: Evaluate patient's response, side effects, and tolerance of medication regimen.  Therapeutic Interventions: 1 to 1 sessions, Unit Group sessions and Medication administration.  Evaluation of Outcomes: Progressing   RN Treatment Plan for Primary Diagnosis: Major depressive disorder, recurrent severe without psychotic features (HCC) Long Term Goal(s): Knowledge of disease and therapeutic regimen to maintain health will improve  Short Term Goals: Ability to demonstrate self-control, Ability to participate in decision making will improve, Ability to verbalize feelings will improve, Ability to disclose and discuss suicidal ideas and Ability to identify and develop effective coping behaviors will improve  Medication Management: RN will administer medications as ordered by provider, will assess and evaluate patient's response and provide education to patient for prescribed medication. RN will report any adverse and/or side effects to prescribing provider.  Therapeutic Interventions: 1 on 1 counseling sessions, Psychoeducation, Medication administration, Evaluate responses to treatment, Monitor vital signs and CBGs as ordered, Perform/monitor CIWA, COWS, AIMS and Fall Risk screenings as ordered, Perform wound care treatments as ordered.  Evaluation of Outcomes: Progressing   LCSW Treatment Plan for Primary Diagnosis: Major depressive disorder, recurrent severe  without psychotic features (HCC) Long Term Goal(s): Safe transition to appropriate next level of care at discharge, Engage patient in therapeutic group addressing interpersonal concerns.  Short Term Goals: Engage patient in aftercare planning with referrals and resources, Increase social support, Increase ability to appropriately verbalize feelings and Increase emotional regulation  Therapeutic Interventions: Assess for all discharge needs, 1 to 1 time with Social worker, Explore available resources and support systems, Assess for adequacy in community support network, Educate family and significant other(s) on suicide prevention, Complete Psychosocial Assessment, Interpersonal group therapy.  Evaluation of Outcomes: Progressing   Progress in Treatment: Attending groups: Yes. and No. Participating in groups: No. Taking medication as prescribed: Yes. Toleration medication: Yes. Family/Significant other contact made: Yes, individual(s) contacted:  SPE completed with her husband. Patient understands diagnosis: Yes. Discussing patient identified problems/goals with staff: Yes. Medical problems stabilized or resolved: Yes. Denies suicidal/homicidal ideation: Yes. Issues/concerns per patient self-inventory: No. Other: none  New problem(s) identified: No, Describe:  none  New Short Term/Long Term Goal(s): elimination of symptoms of psychosis, medication management for mood stabilization; elimination of SI thoughts; development of comprehensive mental wellness plan.  Patient Goals:  "Being reminded of who I am"  Discharge Plan or Barriers: CSW is assessing for appropriate referrals.  Family is requesting a long term stay at a facility in [place].  Patient reports no barriers at this time.   Reason for Continuation of Hospitalization: Anxiety Depression Medication stabilization Suicidal ideation  Estimated Length of Stay: 1-5 days  Recreational Therapy: Patient  Stressors: Relationship Patient Goal: Patient will identify 3 new relaxation techniques to better manage anxiety within 5 recreation therapy group sessions  Attendees: Patient: Krystelle Foskett 04/24/2018 11:15 AM  Physician: Dr. Jennet Maduro, MD 04/24/2018 11:15 AM  Nursing:  04/24/2018 11:15 AM  RN Care Manager: 04/24/2018 11:15 AM  Social Worker: Penni Homans, MSW, LCSW 04/24/2018 11:15 AM  Recreational Therapist: CTRS, LRT 04/24/2018 11:15 AM  Other:  04/24/2018 11:15 AM  Other:  04/24/2018 11:15 AM  Other: 04/24/2018 11:15 AM    Scribe for Treatment Team: Harden Mo, LCSW 04/24/2018 11:15 AM

## 2018-04-24 NOTE — Progress Notes (Signed)
Recreation Therapy Notes   Date: 04/24/2018  Time: 9:30 am  Location: Craft Room  Behavioral response: Appropriate  Intervention Topic: Happiness  Discussion/Intervention:  Group content today was focused on Happiness. The group defined happiness and described where happiness comes from. Individuals identified what makes them happy and how they go about making others happy. Patients expressed things that stop them from being happy and ways they can improve their happiness. The group stated reasons why it is important to be happy. The group participated in the intervention "My Happiness", where they had a chance to identify and express things that make them happy. Clinical Observations/Feedback:  Patient came to group and defined happiness as balance and internal peace. She identified family gatherings and her dogs as things that make her happy.Participant stated she can not be happy when her mental health is not balanced. Patient stated happiness is something she searches for everyday. Individual was social with peers and staff while participating in the intervention.  Lora Chavers LRT/CTRS            Antonia Jicha 04/24/2018 12:05 PM

## 2018-04-24 NOTE — Progress Notes (Signed)
Patient is compliant with her medications and remain safe in the room, feeling of fatigue is described, spends her time in the room due to generalized anxiety and depression, encouraged safety and education is provided for safety and socialization. Denies any SI/HI/AVH , and contract for safety no distress noted 15 minute safety check is in progress.

## 2018-04-25 MED ORDER — TRAZODONE HCL 100 MG PO TABS: 100.0000 mg | ORAL_TABLET | Freq: Every evening | ORAL | 1 refills | Status: DC | PRN

## 2018-04-25 MED ORDER — VENLAFAXINE HCL ER 150 MG PO CP24: 150.0000 mg | ORAL_CAPSULE | Freq: Every day | ORAL | 1 refills | Status: DC
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
## 2018-04-25 NOTE — Plan of Care (Signed)
Patient verbalizes understanding of the general information that's been provided to her and all questions/concerns have been addressed and answered at this time. Patient rated her depression a "5/10" and her anxiety a "3/10" stating that "I'm here, I want to be at home" and "the medication or lack thereof" is why she's feeling this way. Patient denies SI, HI, AVH, at this time. Patient has attended/participated in unit groups without any issues thus far. Patient has the ability to make decisions regarding her health-care. Patient has been free from injury thus far and remains safe on the unit at this time.   Problem: Education: Goal: Knowledge of [facility] Education information/materials will improve Outcome: Progressing Goal: Emotional status will improve Outcome: Progressing Goal: Mental status will improve Outcome: Progressing Goal: Verbalization of understanding the information provided will improve Outcome: Progressing   Problem: Physical Regulation: Goal: Ability to maintain clinical measurements within normal limits will improve Outcome: Progressing   Problem: Safety: Goal: Periods of time without injury will increase Outcome: Progressing   Problem: Coping: Goal: Coping ability will improve Outcome: Progressing Goal: Will verbalize feelings Outcome: Progressing   Problem: Health Behavior/Discharge Planning: Goal: Ability to make decisions will improve Outcome: Progressing Goal: Compliance with therapeutic regimen will improve Outcome: Progressing   Problem: Role Relationship: Goal: Will demonstrate positive changes in social behaviors and relationships Outcome: Progressing

## 2018-04-25 NOTE — Progress Notes (Signed)
Recreation Therapy Notes   Date: 04/25/2018  Time: 9:30 am  Location: Craft Room  Behavioral response: Appropriate  Intervention Topic: Strengths  Discussion/Intervention:  Group content today was focused on strengths. The group identified some of the strengths they have. Individuals stated reason why they do not use their strengths. Patients expressed what strengths others see in them. The group identified important reason to use their strengths. The group participated in the intervention "Picking strengths", where they had a chance to identify some of their strengths. Clinical Observations/Feedback:  Patient came to group and stated one of her strengths is not giving up as well as being kind and compassionate. She explained that strengths make dreams come true. Individual was social with peers and staff while participating in the intervention. Michell Giuliano LRT/CTRS         Benigno Check 04/25/2018 11:23 AM

## 2018-04-25 NOTE — Progress Notes (Signed)
D- Patient alert and oriented. Patient presents in a pleasant mood on assessment stating that she slept good last night, "better than the night before". Patient rated her depression a "5/10" and her anxiety a "3/10" stating that "I'm here, I want to be at home" and "the medication or lack thereof" is why she's feeling this way. Patient denies SI, HI, AVH, at this time. Patient's goal for today is "no pain and work on self-ECT, MRI", in which she will "ask doctor" in order to accomplish this goal.  A- Scheduled medications administered to patient, per MD orders. Support and encouragement provided.  Routine safety checks conducted every 15 minutes.  Patient informed to notify staff with problems or concerns.  R- No adverse drug reactions noted. Patient contracts for safety at this time. Patient compliant with medications and treatment plan. Patient receptive, calm, and cooperative. Patient interacts well with others on the unit.  Patient remains safe at this time.

## 2018-04-25 NOTE — [facility] Group Notes (Signed)
[facility] Group Notes:  (Nursing/MHT/Case Management/Adjunct)  Date:  04/25/2018  Time:  9:37 PM  Type of Therapy:  Group Therapy  Participation Level:  Active  Participation Quality:  Appropriate  Affect:  Appropriate  Cognitive:  Alert  Insight:  Good  Engagement in Group:  Engaged  Modes of Intervention:  Support  Summary of Progress/Problems:  [name] 04/25/2018, 9:37 PM

## 2018-04-25 NOTE — [facility] Suicide Risk Assessment (Addendum)
[facility] Discharge Suicide Risk Assessment   Principal Problem: Major depressive disorder, recurrent severe without psychotic features (HCC) Discharge Diagnoses: Principal Problem:   Major depressive disorder, recurrent severe without psychotic features (HCC) Active Problems:   PTSD (post-traumatic stress disorder)   Opioid use disorder, moderate, dependence (HCC)   Essential hypertension   Sedative, hypnotic or anxiolytic use disorder, severe, dependence (HCC)   Cannabis use disorder, moderate, dependence (HCC)   ADD (attention deficit disorder)   Total Time spent with patient: 20 minutes  Musculoskeletal: Strength & Muscle Tone: within normal limits Gait & Station: normal Patient leans: N/A  Psychiatric Specialty Exam: Review of Systems  Musculoskeletal: Positive for back pain.  Neurological: Negative.   Psychiatric/Behavioral: Negative.   All other systems reviewed and are negative.   Blood pressure 127/87, pulse 90, temperature 99.2 F (37.3 C), temperature source Oral, resp. rate 18, height 5\' 5"  (1.651 m), weight 62.1 kg, SpO2 98 %.Body mass index is 22.8 kg/m.  General Appearance: Casual  Eye Contact::  Good  Speech:  Clear and Coherent409  Volume:  Normal  Mood:  Euthymic  Affect:  Appropriate  Thought Process:  Goal Directed and Descriptions of Associations: Intact  Orientation:  Full (Time, Place, and Person)  Thought Content:  WDL  Suicidal Thoughts:  No  Homicidal Thoughts:  No  Memory:  Immediate;   Fair Recent;   Fair Remote;   Fair  Judgement:  Poor  Insight:  Shallow  Psychomotor Activity:  Normal  Concentration:  Fair  Recall:  [employer] of Knowledge:Fair  Language: Fair  Akathisia:  No  Handed:  Right  AIMS (if indicated):     Assets:  Communication Skills Desire for Improvement Financial Resources/Insurance Housing Intimacy Resilience Social Support Transportation Vocational/Educational  Sleep:  Number of Hours: 6  Cognition: WNL   ADL's:  Intact   Mental Status Per Nursing Assessment::   On Admission:  NA  Demographic Factors:  NA  Loss Factors: Loss of significant relationship  Historical Factors: Prior suicide attempts and Impulsivity  Risk Reduction Factors:   Sense of responsibility to family, Living with another person, especially a relative, Positive social support and Positive therapeutic relationship  Continued Clinical Symptoms:  Bipolar Disorder:   Depressive phase Alcohol/Substance Abuse/Dependencies  Cognitive Features That Contribute To Risk:  None    Suicide Risk:  Minimal: No identifiable suicidal ideation.  Patients presenting with no risk factors but with morbid ruminations; may be classified as minimal risk based on the severity of the depressive symptoms  Follow-up Information    [facility] Follow up.   Why:  Please follow up with Blenda Nicely, [phone].  All information has been sent.  Please contact Medical Records [phone] if any additional infomarion is needed. Contact information: [address] [place], [place] [address] phone: [phone] fax: [phone]          Plan Of Care/Follow-up recommendations:  Activity:  as tolerated Diet:  low sodium heart healthy Other:  keep follow up appointments  Kristine Linea, MD 04/26/2018, 11:25 AM

## 2018-04-25 NOTE — Plan of Care (Signed)
Seen patient in the milieu  socializing with peers and having social activities playing cards and coloring, appear engaging appropriately with out any issues, compliant with her medications with out any side effects, mood is is moderately bright and affect is engaging , patient is endorsing depression and anxiety at 5/10 scale , contract for safety and denies any SI/HI/AVH at this time. Sleep is long and restful with sleep aid and 15 minute safety rounding is maintained, patient voice no concerns .    Problem: Education: Goal: Knowledge of [facility] Education information/materials will improve Outcome: Progressing Goal: Emotional status will improve Outcome: Progressing Goal: Mental status will improve Outcome: Progressing Goal: Verbalization of understanding the information provided will improve Outcome: Progressing   Problem: Physical Regulation: Goal: Ability to maintain clinical measurements within normal limits will improve Outcome: Progressing   Problem: Safety: Goal: Periods of time without injury will increase Outcome: Progressing   Problem: Coping: Goal: Coping ability will improve Outcome: Progressing Goal: Will verbalize feelings Outcome: Progressing   Problem: Health Behavior/Discharge Planning: Goal: Ability to make decisions will improve Outcome: Progressing Goal: Compliance with therapeutic regimen will improve Outcome: Progressing   Problem: Role Relationship: Goal: Will demonstrate positive changes in social behaviors and relationships Outcome: Progressing

## 2018-04-25 NOTE — Discharge Summary (Signed)
Physician Discharge Summary Note  Patient:  Kara [name] is an 63 y.o., female MRN:  [id number] DOB:  08/11/1955 Patient phone:  [phone] (home)  Patient address:   [address] [place] [facility] [address],  Total Time spent with patient: 20 minutes plus 15 min on care coordination and documentation  Date of Admission:  04/22/2018 Date of Discharge: 1/31/202  Reason for Admission:  Suicidal ideation.  History of Present Illness:   Identifying data. Kara [name] is a 64 year old female with a history of depression.  Chief complaint. "I am so shaky."  History of present illness. Information was obtained from the patient and the chart. The patient came to [facility] complaining of suicidal ideation with a plan to overdose that she developed over four days after stopping her regular medications prescribed successfully by Dr. [name], her primary psychiatrist. This is reportedly in response to her husband ongoing marital affairs. She reports that since her last hospitalization at [facility] in November 2019, she has been stable, enjoyed a trip to [place] over the holidays, followed up with her therapist weekly. She denies psychotic symptoms or symptoms suggestive of bipolar mania. She reports very high anxiety with shakes that she believes are from Effexor withdrawal. She admits, that Dr. [name] prescribes twice daily Xanax and Adderall but denies misusing it or running out. No alcohol or substances but she was positive for benzos, stimulants, opioids and cannabis on admission.  Past psychiatric history. Childhood trauma. Long history of depression, mood instability, suicide attempts. Often on multiple medications benzos, stimulants, narcotic pain killers for her back. Stable relationship with her psychiatrist and therapist.  Family psychiatric history. Daughter with borderline.  Social history. This is her third marriage. Married for 28 years with ongoing 3 years affair. Graduated from college  recently in human resources and addiction.  Principal Problem: Major depressive disorder, recurrent severe without psychotic features [facility]) Discharge Diagnoses: Principal Problem:   Major depressive disorder, recurrent severe without psychotic features (HCC) Active Problems:   PTSD (post-traumatic stress disorder)   Opioid use disorder, moderate, dependence (HCC)   Essential hypertension   Sedative, hypnotic or anxiolytic use disorder, severe, dependence (HCC)   Cannabis use disorder, moderate, dependence (HCC)   ADD (attention deficit disorder)   Past Medical History:  Past Medical History:  Diagnosis Date  . ADD (attention deficit disorder)    on Adderal  . ADD (attention deficit disorder)   . Aggressive behavior of adult   . Anemia   . Anxiety   . Cellulitis of breast 11/2013   RIGHT BREAST  . Childhood asthma   . 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 Chronic lower back pain   . Chronic pain    went to [facility] for pain control; stopped in 2016 " (02/23/2017)  . Cold sore   . 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 Degenerative disc disease, lumbar   . Depression    takes meds daily  . Family history of malignant neoplasm of breast   . Fibromyalgia   . Fibromyalgia   . History of kidney stones   . Hypertension   . Osteoporosis   . Osteoporosis   . Other specified rheumatoid arthritis, right shoulder (HCC) 08/01/2011  . Pneumonia 2010?  [name] Post-nasal drip    hx of  . Pre-diabetes   . RA (rheumatoid arthritis) (HCC)    autoimmune arthritis  . RA (rheumatoid arthritis) (HCC)   .  Suicidal intent     Past Surgical History:  Procedure Laterality Date  . ANTERIOR CERVICAL DECOMP/DISCECTOMY FUSION N/A 03/15/2015   Procedure: Cervical five-six, Cerival six-seven, Anterior Cervical Discectomy and Fusion, Allograft and Plate;  Surgeon: Eldred MangesMark C Yates, MD;  Location: [facility] OR;  Service: Orthopedics;   Laterality: N/A;  . AUGMENTATION MAMMAPLASTY  2003  . BACK SURGERY    . BLADDER SUSPENSION  2009  . BREAST IMPLANT REMOVAL Bilateral 10/2013  . CERVICAL WOUND DEBRIDEMENT N/A 07/07/2016   Procedure: IRRIGATION AND DEBRIDEMENT POSTERIOR NECK;  Surgeon: Eldred MangesMark C Yates, MD;  Location: [facility] OR;  Service: Orthopedics;  Laterality: N/A;  . COLONOSCOPY    . COMBINED ABDOMINOPLASTY AND LIPOSUCTION  2003  . INCISION AND DRAINAGE ABSCESS Right 01/16/2014   Procedure: INCISION AND DRAINAGE AND OF RIGHT BREAST ABCESS;  Surgeon: Glenna FellowsBenjamin Hoxworth, MD;  Location: [facility] ORS;  Service: General;  Laterality: Right;  . JOINT REPLACEMENT    . LUMBAR LAMINECTOMY/DECOMPRESSION MICRODISCECTOMY N/A 12/10/2015   Procedure: Right L3-4 Hemilaminectomy, Excision of herniated nucleus pulposus;  Surgeon: Eldred MangesMark C Yates, MD;  [facility];  Service: Orthopedics;  Laterality: N/A;  . MASS EXCISION  11/03/2011   Procedure: MINOR EXCISION OF MASS;  Surgeon: Wyn Forsterobert V Sypher Jr., MD;  Location: [facility];  Service: Orthopedics;  Laterality: Left;  debride IP joint, cyst excision left index  . MAXIMUM ACCESS (MAS) TRANSFORAMINAL LUMBAR INTERBODY FUSION (TLIF) 2 LEVEL Right 02/23/2017  . POSTERIOR CERVICAL FUSION/FORAMINOTOMY N/A 06/21/2016   Procedure: POSTERIOR CERVICAL FUSION C5-C7 SPINOUS PROCESS WIRING;  Surgeon: Eldred MangesYates, Mark C, MD;  Location: [facility] OR;  Service: Orthopedics;  Laterality: N/A;  . POSTERIOR LUMBAR FUSION  07/2009; 07/03/2014   "L4-5; L5-S1"  . SHOULDER ARTHROSCOPY Right 2012  . TONSILLECTOMY  1987  . TOTAL SHOULDER ARTHROPLASTY  08/01/2011   Procedure: TOTAL SHOULDER ARTHROPLASTY;  Surgeon: Eulas PostJoshua P Landau, MD;  Location: [facility] OR;  Service: Orthopedics;  Laterality: Right;  Right total shoulder arthroplasty  . TUBAL LIGATION  1988   Family History:  Family History  Problem Relation Age of Onset  . Arthritis Mother   . Heart disease Mother        ?psvt  . Breast cancer Mother [id number]       TAH/BSO  . Cancer  Mother   . Mental illness Mother   . COPD Father   . Hypertension Father   . Alcohol abuse Father   . Mental illness Father   . Heart disease Father   . Healthy Daughter   . Breast cancer Maternal Aunt 27       deceased  . Cancer Cousin [id number]       female; unknown primary  . Colon cancer Paternal Aunt [id number]       deceased at 267  . Stomach cancer Paternal Uncle [id number]       deceased at 4992  . Alcohol abuse Brother   . Cancer Brother   . Hodgkin's lymphoma Brother   . Mental illness Brother   . HIV Brother   . Healthy Brother   . Healthy Son     Social History:  Social History   Substance and Sexual Activity  Alcohol Use Yes   Comment: 02/23/2017 "might have 1-2 drinks/month"     Social History   Substance and Sexual Activity  Drug Use Yes  . Types: Marijuana   Comment: "i smoke marijuana to avoid opiates"     Social History   Socioeconomic History  .  Marital status: Married    Spouse name: Not on file  . Number of children: Not on file  . Years of education: Not on file  . Highest education level: Not on file  Occupational History  . Not on file  Social Needs  . Financial resource strain: Not on file  . Food insecurity:    Worry: Not on file    Inability: Not on file  . Transportation needs:    Medical: Not on file    Non-medical: Not on file  Tobacco Use  . Smoking status: Former Smoker    Packs/day: 1.00    Years: 10.00    Pack years: 10.00    Types: Cigarettes    Last attempt to quit: 12/06/2005    Years since quitting: 12.3  . Smokeless tobacco: Never Used  Substance and Sexual Activity  . Alcohol use: Yes    Comment: 02/23/2017 "might have 1-2 drinks/month"  . Drug use: Yes    Types: Marijuana    Comment: "i smoke marijuana to avoid opiates"   . Sexual activity: Yes    Birth control/protection: Post-menopausal  Lifestyle  . Physical activity:    Days per week: Not on file    Minutes per session: Not on file  . Stress: Not on file  Relationships   . Social connections:    Talks on phone: Not on file    Gets together: Not on file    Attends religious service: Not on file    Active member of club or organization: Not on file    Attends meetings of clubs or organizations: Not on file    Relationship status: Not on file  Other Topics Concern  . Not on file  Social History Narrative   Married, Marcello MooresKwong Garrels.    6 children.    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    Denies alcohol, tobacco or drug use.   Drinks caffeinated beverages. Uses herbal remedies. Take a daily vitamin.   Wears her seatbelt. Exercises greater than 3 times a week.   Smoke detector in the home, firearms in the home in a locked cabinet, feels safe in her relationships.    Hospital Course:    Kara [name] is a 63 year old female with a history of depresion and mood instability admitted for suicidal ideation with a plan in the context of medication discontinuation and marital problems. We restarted her medications and she tolerated them well. At the time of discharge, the patient is no longer suicidal or homicidal. She is able to contract for safety. He is forward thinking and optimistic about the future.  #Mood, improved -contiue Effexor 150 mg daily -Trileptal 600 mg daily -Trazodone 100 mg nightly -Abilify 15 mg nightly  #PTSD -Minipress 2 mg BID  #Benzodiazepine dependence -completed Librium taper, VS were stable  #Substance abuse -positive for amphetamines, benzodiazepines, opioids and cannabis -minimizes problems -agreable to [facility] long term treatment participation that includes substance abuse  #Chronic pain -Meloxicam 15 mg PRN  #ADHD -we held her regular Adderall  #HTN -Lisinopril 10 mg daily  #Labs -lipid panel, TSH, A1C -EKG  #Disposition -longer term or partial hospitalization recommended -discharge to home with her husband -follow up with Dr. Evelene CroonKaur  Physical Findings: AIMS:  , ,  ,  ,    CIWA:    COWS:      Musculoskeletal: Strength & Muscle Tone: within normal limits Gait & Station: normal Patient leans: N/A  Psychiatric Specialty Exam: Physical Exam  Nursing note and vitals  reviewed. Psychiatric: She has a normal mood and affect. Her speech is normal and behavior is normal. Thought content normal. Cognition and memory are normal. She expresses impulsivity.    Review of Systems  Musculoskeletal: Positive for back pain.  Neurological: Negative.   Psychiatric/Behavioral: Negative.   All other systems reviewed and are negative.   Blood pressure 127/87, pulse 90, temperature 99.2 F (37.3 C), temperature source Oral, resp. rate 18, height  (1.651 m), weight 62.1 kg, SpO2 98 %.Body mass index is 22.8 kg/m.  General Appearance: Casual  Eye Contact:  Good  Speech:  Clear and Coherent  Volume:  Normal  Mood:  Euthymic  Affect:  Appropriate  Thought Process:  Goal Directed and Descriptions of Associations: Intact  Orientation:  Full (Time, Place, and Person)  Thought Content:  WDL  Suicidal Thoughts:  No  Homicidal Thoughts:  No  Memory:  Immediate;   Fair Recent;   Fair Remote;   Fair  Judgement:  Poor  Insight:  Shallow  Psychomotor Activity:  Normal  Concentration:  Concentration: Fair and Attention Span: Fair  Recall:  [employer] of Knowledge:  Fair  Language:  Fair  Akathisia:  No  Handed:  Right  AIMS (if indicated):     Assets:  Communication Skills Desire for Improvement Financial Resources/Insurance Housing Resilience Social Support Transportation Vocational/Educational  ADL's:  Intact  Cognition:  WNL  Sleep:  Number of Hours: 6     Have you used any form of tobacco in the last 30 days? (Cigarettes, Smokeless Tobacco, Cigars, and/or Pipes): No  Has this patient used any form of tobacco in the last 30 days? (Cigarettes, Smokeless Tobacco, Cigars, and/or Pipes) Yes, No  Blood Alcohol level:  Lab Results  Component Value Date   ETH 99 (H) 04/22/2018    ETH <10 02/26/2018    Metabolic Disorder Labs:  Lab Results  Component Value Date   HGBA1C 5.9 (H) 02/21/2017   MPG 122.63 02/21/2017   MPG 120 12/14/2015   No results found for: PROLACTIN Lab Results  Component Value Date   CHOL 278 (H) 08/18/2015   TRIG 182.0 (H) 08/18/2015   HDL 64.30 08/18/2015   CHOLHDL 4 08/18/2015   VLDL 36.4 08/18/2015   LDLCALC 178 (H) 08/18/2015   LDLCALC 121 (H) 08/01/2013    See Psychiatric Specialty Exam and Suicide Risk Assessment completed by Attending Physician prior to discharge.  Discharge destination:  Home  Is patient on multiple antipsychotic therapies at discharge:  No   Has Patient had three or more failed trials of antipsychotic monotherapy by history:  No  Recommended Plan for Multiple Antipsychotic Therapies: NA  Discharge Instructions    Diet - low sodium heart healthy   Complete by:  As directed    Increase activity slowly   Complete by:  As directed      Allergies as of 04/26/2018      Reactions   Aspirin Swelling   Throat swells   Bee Venom Anaphylaxis   Ibuprofen Swelling   Throat swells   Ativan [lorazepam] Other (See Comments)   Agitation/confusion   Ketamine Other (See Comments)   Hallucinations   Toradol [ketorolac Tromethamine] Itching, Swelling, Rash   Tramadol Hcl Itching, Swelling, Rash   Tolerates Dilaudid 06/2016.  TDD.      Medication List    STOP taking these medications   acetaminophen-codeine 300-30 MG tablet Commonly known as:  TYLENOL #3   ALPRAZolam 1 MG tablet Commonly known as:  XANAX   amphetamine-dextroamphetamine 30 MG 24 hr capsule Commonly known as:  ADDERALL XR   amphetamine-dextroamphetamine 30 MG tablet Commonly known as:  ADDERALL   divalproex 500 MG 24 hr tablet Commonly known as:  DEPAKOTE ER   haloperidol 10 MG tablet Commonly known as:  HALDOL   HYDROcodone-acetaminophen 5-325 MG tablet Commonly known as:  NORCO   oxyCODONE-acetaminophen 5-325 MG  tablet Commonly known as:  PERCOCET/ROXICET   zolpidem 10 MG tablet Commonly known as:  AMBIEN     TAKE these medications     Indication  alendronate 35 MG tablet Commonly known as:  FOSAMAX Take 1 tablet (35 mg total) by mouth every 7 (seven) days. Take with a full glass of water on an empty stomach.  Indication:  Osteoporosis   ARIPiprazole 15 MG tablet Commonly known as:  ABILIFY Take 15 mg by mouth at bedtime as needed (depression).  Indication:  Major Depressive Disorder   cholecalciferol 1000 units tablet Commonly known as:  VITAMIN D Take 1,000 Units by mouth daily.  Indication:  osteoporosis   diclofenac sodium 1 % Gel Commonly known as:  VOLTAREN Apply 1 application topically daily as needed for pain.  Indication:  Joint Damage causing Pain and Loss of Function   lisinopril 10 MG tablet Commonly known as:  PRINIVIL,ZESTRIL TAKE 1 AND 1/2 TABLETS DAILY BY MOUTH What changed:    how much to take  how to take this  when to take this  additional instructions  Indication:  High Blood Pressure Disorder   MAGNESIUM PO Take 1 tablet by mouth daily.  Indication:  general health   meloxicam 15 MG tablet Commonly known as:  MOBIC TAKE 1 TABLET (15 MG) BY MOUTH EVERY DAY What changed:    how much to take  how to take this  when to take this  additional instructions  Indication:  Joint Damage causing Pain and Loss of Function   methocarbamol 500 MG tablet Commonly known as:  ROBAXIN Take 1 tablet (500 mg total) by mouth 2 (two) times daily.  Indication:  Musculoskeletal Pain   oxcarbazepine 600 MG tablet Commonly known as:  TRILEPTAL Take 600 mg by mouth daily.  Indication:  bipolar disorder   prazosin 2 MG capsule Commonly known as:  MINIPRESS Take 1 capsule (2 mg total) by mouth 2 (two) times daily. What changed:  when to take this  Indication:  PTSD   traZODone 100 MG tablet Commonly known as:  DESYREL Take 1 tablet (100 mg total) by mouth  at bedtime as needed for sleep.  Indication:  Trouble Sleeping   tretinoin 0.1 % cream Commonly known as:  RETIN-A Apply 1 application topically at bedtime as needed (facial blemishes).  Indication:  Common Acne   venlafaxine XR 150 MG 24 hr capsule Commonly known as:  EFFEXOR-XR Take 1 capsule (150 mg total) by mouth daily with breakfast. What changed:    medication strength  how much to take  Indication:  Major Depressive Disorder      Follow-up Information    Milagros Evener, MD Follow up.   Specialty:  Psychiatry Contact information: [address] P[address]Tyson [address] [place] [address] [phone]        [facility] Follow up.   Contact information: [address] [place], [place] [address] phone: [phone] fax: [phone]          Follow-up recommendations:  Activity:  as tolerated Diet:  low sodium heart healthy Other:  keep  follow up appointment  Comments:    Signed: Kristine Linea, MD 04/26/2018, 9:06 AM

## 2018-04-25 NOTE — BHH Group Notes (Signed)
LCSW Group Therapy Note  04/25/2018 1:00 PM  Type of Therapy/Topic:  Group Therapy:  Balance in Life  Participation Level:  Active  Description of Group:    This group will address the concept of balance and how it feels and looks when one is unbalanced. Patients will be encouraged to process areas in their lives that are out of balance and identify reasons for remaining unbalanced. Facilitators will guide patients in utilizing problem-solving interventions to address and correct the stressor making their life unbalanced. Understanding and applying boundaries will be explored and addressed for obtaining and maintaining a balanced life. Patients will be encouraged to explore ways to assertively make their unbalanced needs known to significant others in their lives, using other group members and facilitator for support and feedback.  Therapeutic Goals: 1. Patient will identify two or more emotions or situations they have that consume much of in their lives. 2. Patient will identify signs/triggers that life has become out of balance:  3. Patient will identify two ways to set boundaries in order to achieve balance in their lives:  4. Patient will demonstrate ability to communicate their needs through discussion and/or role plays  Summary of Patient Progress: Patient was a very active participant in group.  Patient was supportive of other group members. Patient engaged in discussion on the importance of being balanced.  Patient was able to engage in discussion on the negatives and positives of being balanced and unbalanced, respectively.  Patient engaged in group discussions.  Patient did leave group early.    Therapeutic Modalities:   Cognitive Behavioral Therapy Solution-Focused Therapy Assertiveness Training  Penni Homans MSW, LCSW 04/25/2018 2:40 PM

## 2018-04-26 NOTE — Progress Notes (Signed)
Recreation Therapy Notes  INPATIENT RECREATION TR PLAN  Patient Details Name: Kara [name] MRN: [id number] DOB: May 07, 1955 Today's Date: 04/26/2018  Rec Therapy Plan Is patient appropriate for Therapeutic Recreation?: Yes Treatment times per week: at least 3 Estimated Length of Stay: 5-7 days TR Treatment/Interventions: Group participation (Comment)  Discharge Criteria Pt will be discharged from therapy if:: Discharged Treatment plan/goals/alternatives discussed and agreed upon by:: Patient/family  Discharge Summary Short term goals set: Patient will identify 3 new relaxation techniques to better manage anxiety within 5 recreation therapy group sessions Short term goals met: Complete Progress toward goals comments: Groups attended Which groups?: Other (Comment)(Team work, Acupuncturist, Happiness) Reason goals not met: N/A Therapeutic equipment acquired: N/A Reason patient discharged from therapy: Discharge from hospital Pt/family agrees with progress & goals achieved: Yes Date patient discharged from therapy: 04/26/18   Aveena Bari 04/26/2018, 2:49 PM

## 2018-04-26 NOTE — Progress Notes (Signed)
  [facility] Adult Case Management Discharge Plan :  Will you be returning to the same living situation after discharge:  Yes,  pt will be returning to the home with her husband. At discharge, do you have transportation home?: Yes,  pt's husband will be providing transportation. Do you have the ability to pay for your medications: Yes,  [employer]  Release of information consent forms completed and in the chart;  Patient's signature needed at discharge.  Patient to Follow up at: Follow-up Information    [facility] Follow up.   Why:  Please follow up with Kara [name], [phone].  All information has been sent.  Please contact Medical Records [phone] if any additional infomarion is needed. Contact information: [address] [place], [place] [address] phone: [phone] fax: [phone]          Next level of care provider has access to [facility] Link:no  Safety Planning and Suicide Prevention discussed: Yes,  SPE completed with the patient's husband.  SPI pamphlet, Mobile Crisis information, and AA/NA information provided to patient for additional community support and resources.  Have you used any form of tobacco in the last 30 days? (Cigarettes, Smokeless Tobacco, Cigars, and/or Pipes): No  Has patient been referred to the Quitline?: N/A patient is not a smoker  Patient has been referred for addiction treatment: N/A  Harden Mo, LCSW 04/26/2018, 9:23 AM

## 2018-04-26 NOTE — Progress Notes (Signed)
D: Pt A & O X 4. Denies SI, HI, AVH and pain at this time. Presents animated and pleasant on interactions. D/C home as ordered. Picked up outside the unit by her husband.  A: D/C instructions reviewed with pt including prescriptions and follow up appointments; compliance encouraged. All belongings from assigned locker given to pt at time of departure. Scheduled medications given with verbal education and effects monitored. Safety checks maintained without incident till time of d/c.  R: Pt receptive to care. Compliant with medications when offered. Denies adverse drug reactions when assessed. Verbalized understanding related to d/c instructions. Signed belonging sheet in agreement with items received from locker. Ambulatory with a front wheel walker. Appears to be in no physical distress at time of departure.

## 2018-04-26 NOTE — Progress Notes (Signed)
Patient ID: Kara [name], female   DOB: [birth year], 63 y.o.   MRN: [id number] DAR Note: Pt observed in bed resting. At assessment, Pt denied pain, depression, anxiety, SI/HI or AVH; "I am in bed only because I'm tired. I am not depressed of anything like that." All patient's questions and concerns addressed. Support, encouragement, and safe environment provided. Will continue to monitor for safety. Pt was med compliant. Will continue to monitor Pt for safety.

## 2018-04-26 NOTE — Progress Notes (Signed)
Recreation Therapy Notes   Date: 04/26/2018  Time: 9:30 am  Location: Craft Room  Behavioral response: Appropriate  Intervention Topic: Teamwork  Discussion/Intervention:  Group content on today was focused on teamwork. The group identified what teamwork is. Individuals described who is a part of their team. Patients expressed why they thought teamwork is important. The group stated reasons why they thought it was easier to work with a Comptroller team. Individuals discussed some positives and negatives of working with a team. Patients gave examples of past experiences they had while working with a team. The group participated in the intervention "What is That", where patients were given a chance to point out qualities they look for in a team mate and were able to work in teams with each other. Clinical Observations/Feedback:  Patient came to group late due to unknown reasons. Individual was social with peers and staff while participating in the intervention. Peyton Spengler LRT/CTRS         Cincere Deprey 04/26/2018 2:43 PM

## 2018-04-26 NOTE — BHH Counselor (Signed)
CSW spoke with Glade Nurse at [place], [phone], who requested a time to speak with the patient.  CSW informed that patient was being discharged today, however, the patient could potential call today at 10AM if needed.  Katherine decliend, stating that she will follow up with the patient's husband.  Natalia Leatherwood requested discharge summary and CSW informed that Medical Records sends that information at discharge in addition to the patient having a copy.  Penni Homans, MSW, LCSW 04/26/2018 9:32 AM

## 2018-05-04 ENCOUNTER — Other Ambulatory Visit: Payer: Self-pay | Admitting: Family Medicine

## 2018-05-06 ENCOUNTER — Ambulatory Visit
Admit: 2018-05-06 | Discharge: 2018-05-06 | Disposition: A | Discharge status: Home / Self Care | Payer: Managed Care, Other (non HMO) | Attending: Physical Medicine and Rehabilitation | Admitting: Physical Medicine and Rehabilitation

## 2018-05-06 DIAGNOSIS — M25551 Pain in right hip: Secondary | ICD-10-CM

## 2018-05-06 DIAGNOSIS — M25552 Pain in left hip: Principal | ICD-10-CM

## 2018-05-06 MED ORDER — IOPAMIDOL (ISOVUE-M 200) INJECTION 41%
15.0000 mL | Freq: Once | INTRAMUSCULAR | Status: AC
  Administered 2018-05-06: 15 mL via INTRA_ARTICULAR

## 2018-05-13 ENCOUNTER — Other Ambulatory Visit: Payer: Self-pay | Admitting: Physical Medicine and Rehabilitation

## 2018-05-13 DIAGNOSIS — M961 Postlaminectomy syndrome, not elsewhere classified: Secondary | ICD-10-CM

## 2018-05-16 ENCOUNTER — Telehealth: Payer: Self-pay | Admitting: Nurse Practitioner

## 2018-05-16 ENCOUNTER — Other Ambulatory Visit: Payer: Self-pay | Admitting: Neurological Surgery

## 2018-05-16 DIAGNOSIS — M48062 Spinal stenosis, lumbar region with neurogenic claudication: Secondary | ICD-10-CM

## 2018-05-16 NOTE — Telephone Encounter (Signed)
Phone call to patient to verify medication list and allergies for myelogram procedure. Pt instructed to hold Trazodone, Effexor, Adderall for 48hrs prior to myelogram appointment time. Pt verbalized understanding.

## 2018-05-20 ENCOUNTER — Ambulatory Visit
Admission: RE | Admit: 2018-05-20 | Discharge: 2018-05-20 | Disposition: A | Discharge status: Home / Self Care | Payer: Managed Care, Other (non HMO) | Source: Ambulatory Visit | Attending: Neurological Surgery | Admitting: Neurological Surgery

## 2018-05-20 DIAGNOSIS — M48062 Spinal stenosis, lumbar region with neurogenic claudication: Secondary | ICD-10-CM

## 2018-05-20 MED ORDER — MEPERIDINE HCL 50 MG/ML IJ SOLN
50.0000 mg | Freq: Once | INTRAMUSCULAR | Status: AC
  Administered 2018-05-20: 50 mg via INTRAMUSCULAR

## 2018-05-20 MED ORDER — ONDANSETRON HCL 4 MG/2ML IJ SOLN
4.0000 mg | Freq: Once | INTRAMUSCULAR | Status: AC
  Administered 2018-05-20: 4 mg via INTRAMUSCULAR

## 2018-05-20 MED ORDER — IOPAMIDOL (ISOVUE-M 200) INJECTION 41%
20.0000 mL | Freq: Once | INTRAMUSCULAR | Status: AC
  Administered 2018-05-20: 20 mL via INTRATHECAL

## 2018-05-20 MED ORDER — DIAZEPAM 5 MG PO TABS
10.0000 mg | ORAL_TABLET | Freq: Once | ORAL | Status: AC
  Administered 2018-05-20: 5 mg via ORAL

## 2018-05-20 NOTE — Discharge Instructions (Signed)
Myelogram Discharge Instructions  1. Go home and rest quietly for the next 24 hours.  It is important to lie flat for the next 24 hours.  Get up only to go to the restroom.  You may lie in the bed or on a couch on your back, your stomach, your left side or your right side.  You may have one pillow under your head.  You may have pillows between your knees while you are on your side or under your knees while you are on your back.  2. DO NOT drive today.  Recline the seat as far back as it will go, while still wearing your seat belt, on the way home.  3. You may get up to go to the bathroom as needed.  You may sit up for 10 minutes to eat.  You may resume your normal diet and medications unless otherwise indicated.  Drink lots of extra fluids today and tomorrow.  4. The incidence of headache, nausea, or vomiting is about 5% (one in 20 patients).  If you develop a headache, lie flat and drink plenty of fluids until the headache goes away.  Caffeinated beverages may be helpful.  If you develop severe nausea and vomiting or a headache that does not go away with flat bed rest, call [phone].  5. You may resume normal activities after your 24 hours of bed rest is over; however, do not exert yourself strongly or do any heavy lifting tomorrow. If when you get up you have a headache when standing, go back to bed and force fluids for another 24 hours.  6. Call your physician for a follow-up appointment.  The results of your myelogram will be sent directly to your physician by the following day.  7. If you have any questions or if complications develop after you arrive home, please call [phone].  Discharge instructions have been explained to the patient.  The patient, or the person responsible for the patient, fully understands these instructions.  YOU MAY RESTART YOUR EFFEXOR, TRAZODONE AND ADDERALL TOMORROW 05/21/2018 AT 1:00PM.

## 2018-05-23 ENCOUNTER — Other Ambulatory Visit: Payer: Self-pay | Admitting: Neurological Surgery

## 2018-05-24 ENCOUNTER — Other Ambulatory Visit: Payer: Self-pay

## 2018-06-04 ENCOUNTER — Encounter (HOSPITAL_COMMUNITY)
Admission: RE | Admit: 2018-06-04 | Discharge: 2018-06-04 | Disposition: A | Discharge status: Home / Self Care | Payer: Medicare Other | Source: Ambulatory Visit | Attending: Neurological Surgery | Admitting: Neurological Surgery

## 2018-06-04 ENCOUNTER — Encounter (HOSPITAL_COMMUNITY): Payer: Self-pay

## 2018-06-04 ENCOUNTER — Other Ambulatory Visit: Payer: Self-pay

## 2018-06-04 DIAGNOSIS — Z01812 Encounter for preprocedural laboratory examination: Secondary | ICD-10-CM | POA: Insufficient documentation

## 2018-06-04 HISTORY — DX: Personal history of other diseases of the musculoskeletal system and connective tissue: Z87.39

## 2018-06-04 HISTORY — DX: Unspecified inflammatory spondylopathy, site unspecified: M46.90

## 2018-06-04 HISTORY — DX: Post-traumatic stress disorder, unspecified: F43.10

## 2018-06-04 LAB — CBC
HCT: 40.4 % (ref 36.0–46.0)
Hemoglobin: 12.5 g/dL (ref 12.0–15.0)
MCH: 29.4 pg (ref 26.0–34.0)
MCHC: 30.9 g/dL (ref 30.0–36.0)
MCV: 95.1 fL (ref 80.0–100.0)
NRBC: 0 % (ref 0.0–0.2)
Platelets: 434 10*3/uL — ABNORMAL HIGH (ref 150–400)
RBC: 4.25 MIL/uL (ref 3.87–5.11)
RDW: 13.2 % (ref 11.5–15.5)
WBC: 14.2 10*3/uL — ABNORMAL HIGH (ref 4.0–10.5)

## 2018-06-04 LAB — BASIC METABOLIC PANEL
ANION GAP: 9 (ref 5–15)
BUN: 26 mg/dL — ABNORMAL HIGH (ref 8–23)
CALCIUM: 9.9 mg/dL (ref 8.9–10.3)
CO2: 25 mmol/L (ref 22–32)
Chloride: 104 mmol/L (ref 98–111)
Creatinine, Ser: 0.91 mg/dL (ref 0.44–1.00)
GFR calc Af Amer: >60 mL/min (ref >60)
GFR calc non Af Amer: >60 mL/min (ref >60)
Glucose, Bld: 80 mg/dL (ref 70–99)
Potassium: 5.1 mmol/L (ref 3.5–5.1)
Sodium: 138 mmol/L (ref 135–145)

## 2018-06-04 LAB — TYPE AND SCREEN
ABO/RH(D): O NEG
Antibody Screen: NEGATIVE

## 2018-06-04 LAB — SURGICAL PCR SCREEN
MRSA, PCR: NEGATIVE
Staphylococcus aureus: POSITIVE — AB

## 2018-06-04 NOTE — Progress Notes (Signed)
[facility]  Cardiologist - Dr. Allyson Sabal  Chest x-ray - N/A EKG - 02/02/18 Stress Test - 97/7/17 ECHO - 12/03/15 Cardiac Cath - denies  Sleep Study - denies  Aspirin Instructions: Patient instructed to hold all Aspirin, NSAID's, herbal medications, fish oil and vitamins 7 days prior to surgery.   Anesthesia review:   Patient denies shortness of breath, fever, cough and chest pain at PAT appointment   Patient verbalized understanding of instructions that were given to them at the PAT appointment. Patient was also instructed that they will need to review over the PAT instructions again at home before surgery.

## 2018-06-04 NOTE — Pre-Procedure Instructions (Signed)
Kara [name]  06/04/2018      [id number] - [place], [place] - [address] AT CORNER OF [facility] [address] [place] [address] Phone: [phone] Fax: [phone]  [facility],  - [place], [place] - [address] [place] [address] Phone: [phone] Fax: [phone]    Your procedure is scheduled on Tuesday March 17th.  Report to [facility] Admitting at 11:00 A.M.  Call this number if you have problems the morning of surgery:  [phone]   Remember:  Do not eat or drink after midnight.     Take these medicines the morning of surgery with A SIP OF WATER  ALPRAZolam (XANAX XR) if needed methocarbamol (ROBAXIN) if needed prazosin (MINIPRESS) predniSONE (DELTASONE) venlafaxine XR (EFFEXOR-XR)   7 days prior to surgery STOP taking any Aspirin(unless otherwise instructed by your surgeon), meloxicam (MOBIC) , diclofenac sodium (VOLTAREN) 1 % GEL, Aleve, Naproxen, Ibuprofen, Motrin, Advil, Goody's, BC's, all herbal medications, fish oil, and all vitamins    Do not wear jewelry, make-up or nail polish.  Do not wear lotions, powders, or perfumes, or deodorant.  Do not shave 48 hours prior to surgery.  Men may shave face and neck.  Do not bring valuables to the hospital.  [facility] is not responsible for any belongings or valuables.  Contacts, dentures or bridgework may not be worn into surgery.  Leave your suitcase in the car.  After surgery it may be brought to your room.  For patients admitted to the hospital, discharge time will be determined by your treatment team.  Patients discharged the day of surgery will not be allowed to drive home.   [place] [facility]- Preparing For Surgery  Before surgery, you can play an important role. Because skin is not sterile, your skin needs to be as free of germs as possible. You can reduce the number of germs on your skin by washing with CHG  (chlorahexidine gluconate) Soap before surgery.  CHG is an antiseptic cleaner which kills germs and bonds with the skin to continue killing germs even after washing.    Oral Hygiene is also important to reduce your risk of infection.  Remember - BRUSH YOUR TEETH THE MORNING OF SURGERY WITH YOUR REGULAR TOOTHPASTE  Please do not use if you have an allergy to CHG or antibacterial soaps. If your skin becomes reddened/irritated stop using the CHG.  Do not shave (including legs and underarms) for at least 48 hours prior to first CHG shower. It is OK to shave your face.  Please follow these instructions carefully.   1. Shower the NIGHT BEFORE SURGERY and the MORNING OF SURGERY with CHG.   2. If you chose to wash your hair, wash your hair first as usual with your normal shampoo.  3. After you shampoo, rinse your hair and body thoroughly to remove the shampoo.  4. Use CHG as you would any other liquid soap. You can apply CHG directly to the skin and wash gently with a scrungie or a clean washcloth.   5. Apply the CHG Soap to your body ONLY FROM THE NECK DOWN.  Do not use on open wounds or open sores. Avoid contact with your eyes, ears, mouth and genitals (private parts). Wash Face and genitals (private parts)  with your normal soap.  6. Wash thoroughly, paying special attention to the area where your surgery will be performed.  7. Thoroughly rinse your body with warm water from  the neck down.  8. DO NOT shower/wash with your normal soap after using and rinsing off the CHG Soap.  9. Pat yourself dry with a CLEAN TOWEL.  10. Wear CLEAN PAJAMAS to bed the night before surgery, wear comfortable clothes the morning of surgery  11. Place CLEAN SHEETS on your bed the night of your first shower and DO NOT SLEEP WITH PETS.    Day of Surgery: Shower as stated above. Do not apply any deodorants/lotions.  Please wear clean clothes to the hospital/surgery center.   Remember to brush your teeth WITH  YOUR REGULAR TOOTHPASTE.   Please read over the following fact sheets that you were given.

## 2018-06-05 ENCOUNTER — Other Ambulatory Visit: Payer: Self-pay | Admitting: Radiology

## 2018-06-06 NOTE — Anesthesia Preprocedure Evaluation (Addendum)
Anesthesia Evaluation  Patient identified by MRN, date of birth, ID band Patient awake    Reviewed: Allergy & Precautions, NPO status , Patient's Chart, lab work & pertinent test results  History of Anesthesia Complications Negative for: history of anesthetic complications  Airway Mallampati: III  TM Distance: <3 FB Neck ROM: Full    Dental  (+) Teeth Intact   Pulmonary neg shortness of breath, asthma , neg sleep apnea, neg recent URI, former smoker,    breath sounds clear to auscultation       Cardiovascular hypertension,  Rhythm:Regular     Neuro/Psych PSYCHIATRIC DISORDERS Anxiety Depression  Neuromuscular disease    GI/Hepatic negative GI ROS, Neg liver ROS,   Endo/Other  negative endocrine ROS  Renal/GU negative Renal ROS     Musculoskeletal  (+) Arthritis , Fibromyalgia -  Abdominal   Peds  Hematology negative hematology ROS (+)   Anesthesia Other Findings   Reproductive/Obstetrics                           Anesthesia Physical Anesthesia Plan  ASA: III  Anesthesia Plan: General   Post-op Pain Management:    Induction: Intravenous  PONV Risk Score and Plan: 3 and Ondansetron  Airway Management Planned: Oral ETT  Additional Equipment: None  Intra-op Plan:   Post-operative Plan: Extubation in OR  Informed Consent: I have reviewed the patients History and Physical, chart, labs and discussed the procedure including the risks, benefits and alternatives for the proposed anesthesia with the patient or authorized representative who has indicated his/her understanding and acceptance.     Dental advisory given  Plan Discussed with: CRNA and Surgeon  Anesthesia Plan Comments: (Significant psychiatric history. Recent admission Jan 2020 for suicidal ideation.   Mild leukocytosis on preop labs, per med list pt on prednisone. Review of labs shows similar history of intermittent  mild leukocytosis not correlated to acute illness.   Pt evaluated in 2017 by cardiology for atypical chest pain. Echo with EF 60-65%, normal valves. Stress test showed normal perfusion, but could not exclude balanced ischemic--however, Dr. Allyson Sabal thought with normal systolic function that this was less likely. He "reassured her that the likelihood that her chest pain is ischemically mediated is small." )       Anesthesia Quick Evaluation

## 2018-06-11 ENCOUNTER — Encounter (HOSPITAL_COMMUNITY): Payer: Self-pay

## 2018-06-11 ENCOUNTER — Inpatient Hospital Stay (HOSPITAL_COMMUNITY): Payer: Managed Care, Other (non HMO)

## 2018-06-11 ENCOUNTER — Encounter (HOSPITAL_COMMUNITY)
Admission: RE | Disposition: A | Discharge status: Home Health | Payer: Self-pay | Source: Home / Self Care | Attending: Neurological Surgery

## 2018-06-11 ENCOUNTER — Inpatient Hospital Stay (HOSPITAL_COMMUNITY): Payer: Managed Care, Other (non HMO) | Admitting: Vascular Surgery

## 2018-06-11 ENCOUNTER — Other Ambulatory Visit: Payer: Self-pay

## 2018-06-11 ENCOUNTER — Inpatient Hospital Stay (HOSPITAL_COMMUNITY): Payer: Managed Care, Other (non HMO) | Admitting: Physician Assistant

## 2018-06-11 ENCOUNTER — Inpatient Hospital Stay (HOSPITAL_COMMUNITY)
Admission: RE | Admit: 2018-06-11 | Discharge: 2018-06-13 | DRG: 460 | Disposition: A | Discharge status: Home Health | Payer: Managed Care, Other (non HMO) | Attending: Neurological Surgery | Admitting: Neurological Surgery

## 2018-06-11 DIAGNOSIS — Z807 Family history of other malignant neoplasms of lymphoid, hematopoietic and related tissues: Secondary | ICD-10-CM

## 2018-06-11 DIAGNOSIS — R7303 Prediabetes: Secondary | ICD-10-CM | POA: Diagnosis present

## 2018-06-11 DIAGNOSIS — Z803 Family history of malignant neoplasm of breast: Secondary | ICD-10-CM

## 2018-06-11 DIAGNOSIS — Z8261 Family history of arthritis: Secondary | ICD-10-CM

## 2018-06-11 DIAGNOSIS — M4726 Other spondylosis with radiculopathy, lumbar region: Secondary | ICD-10-CM | POA: Diagnosis present

## 2018-06-11 DIAGNOSIS — I10 Essential (primary) hypertension: Secondary | ICD-10-CM | POA: Diagnosis present

## 2018-06-11 DIAGNOSIS — G8929 Other chronic pain: Secondary | ICD-10-CM | POA: Diagnosis present

## 2018-06-11 DIAGNOSIS — Z7983 Long term (current) use of bisphosphonates: Secondary | ICD-10-CM

## 2018-06-11 DIAGNOSIS — Z811 Family history of alcohol abuse and dependence: Secondary | ICD-10-CM | POA: Diagnosis not present

## 2018-06-11 DIAGNOSIS — Z96611 Presence of right artificial shoulder joint: Secondary | ICD-10-CM | POA: Diagnosis present

## 2018-06-11 DIAGNOSIS — M069 Rheumatoid arthritis, unspecified: Secondary | ICD-10-CM | POA: Diagnosis present

## 2018-06-11 DIAGNOSIS — Z981 Arthrodesis status: Secondary | ICD-10-CM

## 2018-06-11 DIAGNOSIS — M48062 Spinal stenosis, lumbar region with neurogenic claudication: Secondary | ICD-10-CM | POA: Diagnosis present

## 2018-06-11 DIAGNOSIS — Z825 Family history of asthma and other chronic lower respiratory diseases: Secondary | ICD-10-CM

## 2018-06-11 DIAGNOSIS — Z888 Allergy status to other drugs, medicaments and biological substances status: Secondary | ICD-10-CM

## 2018-06-11 DIAGNOSIS — Z818 Family history of other mental and behavioral disorders: Secondary | ICD-10-CM

## 2018-06-11 DIAGNOSIS — Z79899 Other long term (current) drug therapy: Secondary | ICD-10-CM

## 2018-06-11 DIAGNOSIS — F329 Major depressive disorder, single episode, unspecified: Secondary | ICD-10-CM | POA: Diagnosis present

## 2018-06-11 DIAGNOSIS — Z886 Allergy status to analgesic agent status: Secondary | ICD-10-CM

## 2018-06-11 DIAGNOSIS — Z87442 Personal history of urinary calculi: Secondary | ICD-10-CM

## 2018-06-11 DIAGNOSIS — Z419 Encounter for procedure for purposes other than remedying health state, unspecified: Secondary | ICD-10-CM

## 2018-06-11 DIAGNOSIS — Z885 Allergy status to narcotic agent status: Secondary | ICD-10-CM | POA: Diagnosis not present

## 2018-06-11 DIAGNOSIS — M797 Fibromyalgia: Secondary | ICD-10-CM | POA: Diagnosis present

## 2018-06-11 DIAGNOSIS — F988 Other specified behavioral and emotional disorders with onset usually occurring in childhood and adolescence: Secondary | ICD-10-CM | POA: Diagnosis present

## 2018-06-11 DIAGNOSIS — Z800 Family history of malignant neoplasm of digestive organs: Secondary | ICD-10-CM | POA: Diagnosis not present

## 2018-06-11 DIAGNOSIS — Z791 Long term (current) use of non-steroidal anti-inflammatories (NSAID): Secondary | ICD-10-CM

## 2018-06-11 DIAGNOSIS — Z8249 Family history of ischemic heart disease and other diseases of the circulatory system: Secondary | ICD-10-CM | POA: Diagnosis not present

## 2018-06-11 DIAGNOSIS — M810 Age-related osteoporosis without current pathological fracture: Secondary | ICD-10-CM | POA: Diagnosis present

## 2018-06-11 DIAGNOSIS — Z87891 Personal history of nicotine dependence: Secondary | ICD-10-CM

## 2018-06-11 DIAGNOSIS — Z7952 Long term (current) use of systemic steroids: Secondary | ICD-10-CM

## 2018-06-11 HISTORY — PX: ANTERIOR LAT LUMBAR FUSION: SHX1168

## 2018-06-11 SURGERY — ANTERIOR LATERAL LUMBAR FUSION 1 LEVEL
Anesthesia: General | Laterality: Left

## 2018-06-11 MED ORDER — TRAZODONE HCL 100 MG PO TABS
100.0000 mg | ORAL_TABLET | Freq: Every day | ORAL | Status: DC
  Administered 2018-06-11 – 2018-06-12 (×2): 100 mg via ORAL
  Filled 2018-06-11 (×2): qty 1

## 2018-06-11 MED ORDER — HYDROMORPHONE HCL 1 MG/ML IJ SOLN
0.2500 mg | INTRAMUSCULAR | Status: DC | PRN
  Administered 2018-06-11 (×2): 0.5 mg via INTRAVENOUS
  Administered 2018-06-11: 0.25 mg via INTRAVENOUS
  Administered 2018-06-11: 0.5 mg via INTRAVENOUS
  Administered 2018-06-11: 0.25 mg via INTRAVENOUS

## 2018-06-11 MED ORDER — BUPIVACAINE HCL (PF) 0.5 % IJ SOLN
INTRAMUSCULAR | Status: DC | PRN
  Administered 2018-06-11: 7 mL

## 2018-06-11 MED ORDER — ACETAMINOPHEN 500 MG PO TABS
ORAL_TABLET | ORAL | Status: AC
  Filled 2018-06-11: qty 2

## 2018-06-11 MED ORDER — PROPOFOL 10 MG/ML IV BOLUS
INTRAVENOUS | Status: AC
  Filled 2018-06-11: qty 40

## 2018-06-11 MED ORDER — METHOCARBAMOL 500 MG PO TABS
500.0000 mg | ORAL_TABLET | Freq: Four times a day (QID) | ORAL | Status: DC | PRN
  Administered 2018-06-11 – 2018-06-13 (×7): 500 mg via ORAL
  Filled 2018-06-11 (×7): qty 1

## 2018-06-11 MED ORDER — HYDROMORPHONE HCL 1 MG/ML IJ SOLN
0.2500 mg | INTRAMUSCULAR | Status: DC | PRN
  Administered 2018-06-11: 0.25 mg via INTRAVENOUS
  Administered 2018-06-11: 0.5 mg via INTRAVENOUS
  Administered 2018-06-11: 0.25 mg via INTRAVENOUS

## 2018-06-11 MED ORDER — MAGNESIUM OXIDE 400 (241.3 MG) MG PO TABS
400.0000 mg | ORAL_TABLET | Freq: Every day | ORAL | Status: DC
  Administered 2018-06-12 – 2018-06-13 (×2): 400 mg via ORAL
  Filled 2018-06-11 (×2): qty 1

## 2018-06-11 MED ORDER — SODIUM CHLORIDE 0.9 % IV SOLN
INTRAVENOUS | Status: DC | PRN
  Administered 2018-06-11: 25 ug/min via INTRAVENOUS

## 2018-06-11 MED ORDER — LIDOCAINE 2% (20 MG/ML) 5 ML SYRINGE
INTRAMUSCULAR | Status: AC
  Filled 2018-06-11: qty 5

## 2018-06-11 MED ORDER — LIDOCAINE 2% (20 MG/ML) 5 ML SYRINGE
INTRAMUSCULAR | Status: DC | PRN
  Administered 2018-06-11: 60 mg via INTRAVENOUS

## 2018-06-11 MED ORDER — SODIUM CHLORIDE 0.9% FLUSH: 3.0000 mL | INTRAVENOUS | Status: DC | PRN

## 2018-06-11 MED ORDER — ACETAMINOPHEN 325 MG PO TABS
650.0000 mg | ORAL_TABLET | ORAL | Status: DC | PRN
  Administered 2018-06-12 – 2018-06-13 (×2): 650 mg via ORAL
  Filled 2018-06-11 (×2): qty 2

## 2018-06-11 MED ORDER — EPHEDRINE 5 MG/ML INJ
INTRAVENOUS | Status: AC
  Filled 2018-06-11: qty 10

## 2018-06-11 MED ORDER — BISACODYL 10 MG RE SUPP: 10.0000 mg | Freq: Every day | RECTAL | Status: DC | PRN

## 2018-06-11 MED ORDER — CHLORHEXIDINE GLUCONATE CLOTH 2 % EX PADS: 6.0000 | MEDICATED_PAD | Freq: Once | CUTANEOUS | Status: DC

## 2018-06-11 MED ORDER — DOCUSATE SODIUM 100 MG PO CAPS
100.0000 mg | ORAL_CAPSULE | Freq: Two times a day (BID) | ORAL | Status: DC
  Administered 2018-06-11 – 2018-06-13 (×4): 100 mg via ORAL
  Filled 2018-06-11 (×4): qty 1

## 2018-06-11 MED ORDER — 0.9 % SODIUM CHLORIDE (POUR BTL) OPTIME
TOPICAL | Status: DC | PRN
  Administered 2018-06-11: 1000 mL

## 2018-06-11 MED ORDER — OXYCODONE HCL 5 MG/5ML PO SOLN: 5.0000 mg | Freq: Once | ORAL | Status: AC | PRN

## 2018-06-11 MED ORDER — SUCCINYLCHOLINE CHLORIDE 200 MG/10ML IV SOSY
PREFILLED_SYRINGE | INTRAVENOUS | Status: AC
  Filled 2018-06-11: qty 10

## 2018-06-11 MED ORDER — OXYCODONE HCL 5 MG PO TABS
ORAL_TABLET | ORAL | Status: AC
  Filled 2018-06-11: qty 1

## 2018-06-11 MED ORDER — SODIUM CHLORIDE 0.9% FLUSH
3.0000 mL | Freq: Two times a day (BID) | INTRAVENOUS | Status: DC
  Administered 2018-06-12 – 2018-06-13 (×3): 3 mL via INTRAVENOUS

## 2018-06-11 MED ORDER — MORPHINE SULFATE (PF) 2 MG/ML IV SOLN
2.0000 mg | INTRAVENOUS | Status: DC | PRN
  Administered 2018-06-11 – 2018-06-12 (×5): 4 mg via INTRAVENOUS
  Filled 2018-06-11: qty 2
  Filled 2018-06-11: qty 1
  Filled 2018-06-11 (×4): qty 2

## 2018-06-11 MED ORDER — BUPIVACAINE HCL (PF) 0.5 % IJ SOLN
INTRAMUSCULAR | Status: AC
  Filled 2018-06-11: qty 30

## 2018-06-11 MED ORDER — HYDROMORPHONE HCL 1 MG/ML IJ SOLN
INTRAMUSCULAR | Status: AC
  Filled 2018-06-11: qty 1

## 2018-06-11 MED ORDER — LIDOCAINE-EPINEPHRINE 1 %-1:100000 IJ SOLN
INTRAMUSCULAR | Status: DC | PRN
  Administered 2018-06-11: 7 mL

## 2018-06-11 MED ORDER — MIDAZOLAM HCL 2 MG/2ML IJ SOLN
INTRAMUSCULAR | Status: AC
  Filled 2018-06-11: qty 2

## 2018-06-11 MED ORDER — FENTANYL CITRATE (PF) 250 MCG/5ML IJ SOLN
INTRAMUSCULAR | Status: DC | PRN
  Administered 2018-06-11 (×2): 50 ug via INTRAVENOUS
  Administered 2018-06-11: 100 ug via INTRAVENOUS
  Administered 2018-06-11: 50 ug via INTRAVENOUS

## 2018-06-11 MED ORDER — ALPRAZOLAM 0.5 MG PO TABS
0.5000 mg | ORAL_TABLET | Freq: Two times a day (BID) | ORAL | Status: DC | PRN
  Administered 2018-06-12 (×2): 0.5 mg via ORAL
  Filled 2018-06-11 (×2): qty 1

## 2018-06-11 MED ORDER — THROMBIN 5000 UNITS EX SOLR
CUTANEOUS | Status: AC
  Filled 2018-06-11: qty 5000

## 2018-06-11 MED ORDER — VENLAFAXINE HCL ER 75 MG PO CP24
75.0000 mg | ORAL_CAPSULE | Freq: Every day | ORAL | Status: DC
  Administered 2018-06-12 – 2018-06-13 (×2): 75 mg via ORAL
  Filled 2018-06-11 (×2): qty 1

## 2018-06-11 MED ORDER — ONDANSETRON HCL 4 MG/2ML IJ SOLN
INTRAMUSCULAR | Status: DC | PRN
  Administered 2018-06-11: 4 mg via INTRAVENOUS

## 2018-06-11 MED ORDER — LISINOPRIL 10 MG PO TABS
10.0000 mg | ORAL_TABLET | Freq: Every day | ORAL | Status: DC
  Administered 2018-06-12 – 2018-06-13 (×2): 10 mg via ORAL
  Filled 2018-06-11 (×2): qty 1

## 2018-06-11 MED ORDER — LACTATED RINGERS IV SOLN
INTRAVENOUS | Status: DC
  Administered 2018-06-11 (×2): via INTRAVENOUS

## 2018-06-11 MED ORDER — THROMBIN 5000 UNITS EX SOLR
OROMUCOSAL | Status: DC | PRN
  Administered 2018-06-11: 13:00:00 via TOPICAL

## 2018-06-11 MED ORDER — DEXAMETHASONE SODIUM PHOSPHATE 10 MG/ML IJ SOLN
INTRAMUSCULAR | Status: AC
  Filled 2018-06-11: qty 1

## 2018-06-11 MED ORDER — PROPOFOL 10 MG/ML IV BOLUS
INTRAVENOUS | Status: DC | PRN
  Administered 2018-06-11: 160 mg via INTRAVENOUS

## 2018-06-11 MED ORDER — METHOCARBAMOL 500 MG PO TABS
ORAL_TABLET | ORAL | Status: AC
  Filled 2018-06-11: qty 1

## 2018-06-11 MED ORDER — POLYETHYLENE GLYCOL 3350 17 G PO PACK: 17.0000 g | PACK | Freq: Every day | ORAL | Status: DC | PRN

## 2018-06-11 MED ORDER — PHENOL 1.4 % MT LIQD: 1.0000 | OROMUCOSAL | Status: DC | PRN

## 2018-06-11 MED ORDER — OXYCODONE HCL 5 MG PO TABS
5.0000 mg | ORAL_TABLET | Freq: Once | ORAL | Status: AC | PRN
  Administered 2018-06-11: 5 mg via ORAL

## 2018-06-11 MED ORDER — CEFAZOLIN SODIUM-DEXTROSE 2-4 GM/100ML-% IV SOLN
2.0000 g | Freq: Three times a day (TID) | INTRAVENOUS | Status: AC
  Administered 2018-06-11 – 2018-06-12 (×2): 2 g via INTRAVENOUS
  Filled 2018-06-11 (×2): qty 100

## 2018-06-11 MED ORDER — AMPHETAMINE-DEXTROAMPHET ER 10 MG PO CP24: 30.0000 mg | ORAL_CAPSULE | Freq: Two times a day (BID) | ORAL | Status: DC

## 2018-06-11 MED ORDER — ACETAMINOPHEN 650 MG RE SUPP: 650.0000 mg | RECTAL | Status: DC | PRN

## 2018-06-11 MED ORDER — OXYCODONE-ACETAMINOPHEN 5-325 MG PO TABS
1.0000 | ORAL_TABLET | ORAL | Status: DC | PRN
  Administered 2018-06-11 – 2018-06-12 (×3): 2 via ORAL
  Filled 2018-06-11 (×3): qty 2

## 2018-06-11 MED ORDER — CEFAZOLIN SODIUM-DEXTROSE 2-4 GM/100ML-% IV SOLN
2.0000 g | INTRAVENOUS | Status: AC
  Administered 2018-06-11: 2 g via INTRAVENOUS
  Filled 2018-06-11: qty 100

## 2018-06-11 MED ORDER — PROPOFOL 500 MG/50ML IV EMUL
INTRAVENOUS | Status: DC | PRN
  Administered 2018-06-11: 50 ug/kg/min via INTRAVENOUS

## 2018-06-11 MED ORDER — METHOCARBAMOL 1000 MG/10ML IJ SOLN
500.0000 mg | Freq: Four times a day (QID) | INTRAVENOUS | Status: DC | PRN
  Filled 2018-06-11: qty 5

## 2018-06-11 MED ORDER — SODIUM CHLORIDE 0.9 % IV SOLN: 250.0000 mL | INTRAVENOUS | Status: DC

## 2018-06-11 MED ORDER — FLEET ENEMA 7-19 GM/118ML RE ENEM: 1.0000 | ENEMA | Freq: Once | RECTAL | Status: DC | PRN

## 2018-06-11 MED ORDER — DEXAMETHASONE SODIUM PHOSPHATE 10 MG/ML IJ SOLN
INTRAMUSCULAR | Status: DC | PRN
  Administered 2018-06-11: 10 mg via INTRAVENOUS

## 2018-06-11 MED ORDER — FENTANYL CITRATE (PF) 250 MCG/5ML IJ SOLN
INTRAMUSCULAR | Status: AC
  Filled 2018-06-11: qty 5

## 2018-06-11 MED ORDER — PREDNISONE 5 MG PO TABS
5.0000 mg | ORAL_TABLET | Freq: Every day | ORAL | Status: DC
  Administered 2018-06-12: 5 mg via ORAL
  Filled 2018-06-11: qty 1

## 2018-06-11 MED ORDER — LIDOCAINE-EPINEPHRINE 1 %-1:100000 IJ SOLN
INTRAMUSCULAR | Status: AC
  Filled 2018-06-11: qty 1

## 2018-06-11 MED ORDER — MIDAZOLAM HCL 5 MG/5ML IJ SOLN
INTRAMUSCULAR | Status: DC | PRN
  Administered 2018-06-11: 2 mg via INTRAVENOUS

## 2018-06-11 MED ORDER — MENTHOL 3 MG MT LOZG: 1.0000 | LOZENGE | OROMUCOSAL | Status: DC | PRN

## 2018-06-11 MED ORDER — SENNA 8.6 MG PO TABS
1.0000 | ORAL_TABLET | Freq: Two times a day (BID) | ORAL | Status: DC
  Administered 2018-06-11 – 2018-06-13 (×4): 8.6 mg via ORAL
  Filled 2018-06-11 (×4): qty 1

## 2018-06-11 MED ORDER — PROMETHAZINE HCL 25 MG/ML IJ SOLN: 6.2500 mg | INTRAMUSCULAR | Status: DC | PRN

## 2018-06-11 MED ORDER — ZOLPIDEM TARTRATE 5 MG PO TABS
5.0000 mg | ORAL_TABLET | Freq: Every evening | ORAL | Status: DC | PRN
  Filled 2018-06-11: qty 1

## 2018-06-11 MED ORDER — CHLORHEXIDINE GLUCONATE CLOTH 2 % EX PADS: 6.0000 | MEDICATED_PAD | Freq: Every day | CUTANEOUS | Status: DC

## 2018-06-11 MED ORDER — ACETAMINOPHEN 500 MG PO TABS
1000.0000 mg | ORAL_TABLET | Freq: Once | ORAL | Status: AC
  Administered 2018-06-11: 1000 mg via ORAL
  Filled 2018-06-11: qty 2

## 2018-06-11 MED ORDER — ONDANSETRON HCL 4 MG/2ML IJ SOLN
INTRAMUSCULAR | Status: AC
  Filled 2018-06-11: qty 2

## 2018-06-11 MED ORDER — ONDANSETRON HCL 4 MG PO TABS: 4.0000 mg | ORAL_TABLET | Freq: Four times a day (QID) | ORAL | Status: DC | PRN

## 2018-06-11 MED ORDER — HYDROMORPHONE HCL 1 MG/ML IJ SOLN
INTRAMUSCULAR | Status: AC
  Administered 2018-06-11: 0.5 mg via INTRAVENOUS
  Filled 2018-06-11: qty 1

## 2018-06-11 MED ORDER — SUCCINYLCHOLINE CHLORIDE 200 MG/10ML IV SOSY
PREFILLED_SYRINGE | INTRAVENOUS | Status: DC | PRN
  Administered 2018-06-11: 70 mg via INTRAVENOUS

## 2018-06-11 MED ORDER — MUPIROCIN 2 % EX OINT: 1.0000 "application " | TOPICAL_OINTMENT | Freq: Two times a day (BID) | CUTANEOUS | Status: DC

## 2018-06-11 MED ORDER — EPHEDRINE SULFATE-NACL 50-0.9 MG/10ML-% IV SOSY
PREFILLED_SYRINGE | INTRAVENOUS | Status: DC | PRN
  Administered 2018-06-11 (×2): 5 mg via INTRAVENOUS

## 2018-06-11 MED ORDER — ONDANSETRON HCL 4 MG/2ML IJ SOLN: 4.0000 mg | Freq: Four times a day (QID) | INTRAMUSCULAR | Status: DC | PRN

## 2018-06-11 MED ORDER — ALPRAZOLAM ER 1 MG PO TB24: 1.0000 mg | ORAL_TABLET | Freq: Two times a day (BID) | ORAL | Status: DC | PRN

## 2018-06-11 SURGICAL SUPPLY — 50 items
BLADE CLIPPER SURG (BLADE) IMPLANT
BONE CANC CHIPS 20CC PCAN1/4 (Bone Implant) ×2 IMPLANT
CHIPS CANC BONE 20CC PCAN1/4 (Bone Implant) ×1 IMPLANT
COROENT XL-W 8X22X50 (Orthopedic Implant) ×2 IMPLANT
COVER WAND RF STERILE (DRAPES) ×2 IMPLANT
DERMABOND ADVANCED (GAUZE/BANDAGES/DRESSINGS) ×1
DERMABOND ADVANCED .7 DNX12 (GAUZE/BANDAGES/DRESSINGS) ×1 IMPLANT
DRAPE C-ARM 42X72 X-RAY (DRAPES) ×2 IMPLANT
DRAPE C-ARMOR (DRAPES) ×2 IMPLANT
DRAPE LAPAROTOMY 100X72X124 (DRAPES) ×2 IMPLANT
DRAPE POUCH INSTRU U-SHP 10X18 (DRAPES) ×2 IMPLANT
DURAPREP 26ML APPLICATOR (WOUND CARE) ×2 IMPLANT
ELECT BLADE 4.0 EZ CLEAN MEGAD (MISCELLANEOUS) ×2
ELECT REM PT RETURN 9FT ADLT (ELECTROSURGICAL) ×2
ELECTRODE BLDE 4.0 EZ CLN MEGD (MISCELLANEOUS) ×1 IMPLANT
ELECTRODE REM PT RTRN 9FT ADLT (ELECTROSURGICAL) ×1 IMPLANT
GAUZE 4X4 16PLY RFD (DISPOSABLE) IMPLANT
GLOVE BIOGEL PI IND STRL 6.5 (GLOVE) ×2 IMPLANT
GLOVE BIOGEL PI IND STRL 8 (GLOVE) ×1 IMPLANT
GLOVE BIOGEL PI IND STRL 8.5 (GLOVE) ×1 IMPLANT
GLOVE BIOGEL PI INDICATOR 6.5 (GLOVE) ×2
GLOVE BIOGEL PI INDICATOR 8 (GLOVE) ×1
GLOVE BIOGEL PI INDICATOR 8.5 (GLOVE) ×1
GLOVE ECLIPSE 7.0 STRL STRAW (GLOVE) ×4 IMPLANT
GLOVE ECLIPSE 7.5 STRL STRAW (GLOVE) ×6 IMPLANT
GLOVE ECLIPSE 8.5 STRL (GLOVE) ×2 IMPLANT
GLOVE EXAM NITRILE XL STR (GLOVE) IMPLANT
GOWN STRL REUS W/ TWL LRG LVL3 (GOWN DISPOSABLE) ×1 IMPLANT
GOWN STRL REUS W/ TWL XL LVL3 (GOWN DISPOSABLE) ×1 IMPLANT
GOWN STRL REUS W/TWL 2XL LVL3 (GOWN DISPOSABLE) ×6 IMPLANT
GOWN STRL REUS W/TWL LRG LVL3 (GOWN DISPOSABLE) ×1
GOWN STRL REUS W/TWL XL LVL3 (GOWN DISPOSABLE) ×1
KIT BASIN OR (CUSTOM PROCEDURE TRAY) ×2 IMPLANT
KIT DILATOR XLIF 5 (KITS) ×2 IMPLANT
KIT INFUSE X SMALL 1.4CC (Orthopedic Implant) ×2 IMPLANT
KIT SURGICAL ACCESS MAXCESS 4 (KITS) ×2 IMPLANT
KIT TURNOVER KIT B (KITS) ×2 IMPLANT
MODULE NVM5 NEXT GEN EMG (NEEDLE) ×2 IMPLANT
NEEDLE HYPO 25X1 1.5 SAFETY (NEEDLE) ×2 IMPLANT
NS IRRIG 1000ML POUR BTL (IV SOLUTION) ×2 IMPLANT
PACK LAMINECTOMY NEURO (CUSTOM PROCEDURE TRAY) ×2 IMPLANT
PLATE 2H DECADE 8MM (Plate) ×2 IMPLANT
SCREW DECADE 5.5X50 (Screw) ×4 IMPLANT
SPONGE LAP 4X18 RFD (DISPOSABLE) IMPLANT
SUT VIC AB 2-0 CP2 18 (SUTURE) ×2 IMPLANT
SUT VIC AB 3-0 SH 8-18 (SUTURE) ×2 IMPLANT
TOWEL GREEN STERILE (TOWEL DISPOSABLE) ×2 IMPLANT
TOWEL GREEN STERILE FF (TOWEL DISPOSABLE) ×2 IMPLANT
TRAY FOLEY MTR SLVR 16FR STAT (SET/KITS/TRAYS/PACK) ×2 IMPLANT
WATER STERILE IRR 1000ML POUR (IV SOLUTION) ×2 IMPLANT

## 2018-06-11 NOTE — Transfer of Care (Signed)
Immediate Anesthesia Transfer of Care Note  Patient: Kara [name]  Procedure(s) Performed: Left Lumbar two-three Anterolateral decompression/fusion/lateral plate fixation (Left )  Patient Location: PACU  Anesthesia Type:General  Level of Consciousness: drowsy  Airway & Oxygen Therapy: Patient Spontanous Breathing and Patient connected to face mask oxygen  Post-op Assessment: Report given to RN, Post -op Vital signs reviewed and stable and Patient moving all extremities X 4  Post vital signs: Reviewed and stable  Last Vitals:  Vitals Value Taken Time  BP 153/91 06/11/2018  3:45 PM  Temp    Pulse 77 06/11/2018  3:48 PM  Resp 12 06/11/2018  3:48 PM  SpO2 100 % 06/11/2018  3:48 PM  Vitals shown include unvalidated device data.  Last Pain:  Vitals:   06/11/18 1201  TempSrc:   PainSc: 1       Patients Stated Pain Goal: 3 (06/11/18 1049)  Complications: No apparent anesthesia complications

## 2018-06-11 NOTE — Progress Notes (Signed)
Patient called out requesting something for pain. I went into see patient prior to calling anesthesiologist. Patient reported pain 10/10. I called Dr. Maple Hudson and was given an order for acetaminophen 1 gram PO as patient had no home narcotic pain medication. Just prior to giving PO acetaminophen when I approached room patient was lying on bed with eye closed. I went into her room and had to call her name prior to her realizing I was in the room. I told her what medication we were going to give her and she said "I am only getting fucking tylenol." She took medication and said it would not help.

## 2018-06-11 NOTE — Progress Notes (Signed)
Patient found at doorway crying and yelling that she was in pain.  Patient had received Tylenol and was yelling that the Tylenol would do nothing for her and that she needed something else.  Nurse asked patient what she does for pain at home and patient stated that she takes Oxycotin that was left over from a previous surgery but she did not have any today.  Patient was assisted back to bed and given warm blankets.  Dr. Maple Hudson made aware of the situation.

## 2018-06-11 NOTE — H&P (Signed)
Kara [name] is an 63 y.o. female.   Chief Complaint: Back and bilateral lower extremity pain HPI: Kara [name] is a 63 year old individual who has had previous decompression fusion from L3-S1 has been done in several different operations.  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  She has developed progressive pain and weakness in her proximal lower extremities.  Recent myelogram demonstrated a severity of a nearly complete myelographic block at L2-L3.  She has been advised that she requires surgical decompression which will be done via an indirect technique using an anterolateral decompression to distract the disc space.  Lateral fixation will also be placed.  Past Medical History:  Diagnosis Date  . ADD (attention deficit disorder)    on Adderal  . ADD (attention deficit disorder)   . Aggressive behavior of adult   . Anemia   . Anxiety   . Cellulitis of breast 11/2013   RIGHT BREAST  . Childhood asthma   . 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 Chronic lower back pain   . Chronic pain    went to [facility] for pain control; stopped in 2016 " (02/23/2017)  . Cold sore   . 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 Degenerative disc disease, lumbar   . Depression    takes meds daily  . Family history of malignant neoplasm of breast   . Fibromyalgia   . Fibromyalgia   . H/O degenerative disc disease   . History of kidney stones   . Hypertension   . Osteoporosis   . Osteoporosis   . Other specified rheumatoid arthritis, right shoulder (HCC) 08/01/2011  . Pneumonia 2010?  [name]. Post-nasal drip    hx of  . Pre-diabetes   . PTSD (post-traumatic stress disorder)   . RA (rheumatoid arthritis) (HCC)    autoimmune arthritis  . RA (rheumatoid arthritis) (HCC)   . Spondylitis (HCC)   .  Suicidal intent     Past Surgical History:  Procedure Laterality Date  . ANTERIOR CERVICAL DECOMP/DISCECTOMY FUSION N/A 03/15/2015   Procedure: Cervical five-six, Cerival six-seven, Anterior Cervical Discectomy and Fusion, Allograft and Plate;  Surgeon: Eldred MangesMark C Yates, MD;  Location: [facility] OR;  Service: Orthopedics;  Laterality: N/A;  . AUGMENTATION MAMMAPLASTY  2003  . BACK SURGERY    . BLADDER SUSPENSION  2009  . BREAST IMPLANT REMOVAL Bilateral 10/2013  . CERVICAL WOUND DEBRIDEMENT N/A 07/07/2016   Procedure: IRRIGATION AND DEBRIDEMENT POSTERIOR NECK;  Surgeon: Eldred MangesMark C Yates, MD;  Location: [facility] OR;  Service: Orthopedics;  Laterality: N/A;  . COLONOSCOPY    . COMBINED ABDOMINOPLASTY AND LIPOSUCTION  2003  . INCISION AND DRAINAGE ABSCESS Right 01/16/2014   Procedure: INCISION AND DRAINAGE AND OF RIGHT BREAST ABCESS;  Surgeon: Glenna FellowsBenjamin Hoxworth, MD;  Location: [facility];  Service: General;  Laterality: Right;  . JOINT REPLACEMENT    . LUMBAR LAMINECTOMY/DECOMPRESSION MICRODISCECTOMY N/A 12/10/2015   Procedure: Right L3-4 Hemilaminectomy, Excision of herniated nucleus pulposus;  Surgeon: Eldred MangesMark C Yates, MD;  [facility];  Service: Orthopedics;  Laterality: N/A;  . MASS EXCISION  11/03/2011   Procedure: MINOR EXCISION OF MASS;  Surgeon: Wyn Forsterobert V Sypher Jr., MD;  Location: [facility];  Service: Orthopedics;  Laterality: Left;  debride IP joint, cyst excision left index  . MAXIMUM ACCESS (MAS) TRANSFORAMINAL  LUMBAR INTERBODY FUSION (TLIF) 2 LEVEL Right 02/23/2017  . POSTERIOR CERVICAL FUSION/FORAMINOTOMY N/A 06/21/2016   Procedure: POSTERIOR CERVICAL FUSION C5-C7 SPINOUS PROCESS WIRING;  Surgeon: Eldred Manges, MD;  Location: [facility] OR;  Service: Orthopedics;  Laterality: N/A;  . POSTERIOR LUMBAR FUSION  07/2009; 07/03/2014   "L4-5; L5-S1"  . SHOULDER ARTHROSCOPY Right 2012  . TONSILLECTOMY  1987  . TOTAL SHOULDER ARTHROPLASTY  08/01/2011   Procedure: TOTAL SHOULDER ARTHROPLASTY;  Surgeon: Eulas Post, MD;  Location: [facility] OR;  Service: Orthopedics;  Laterality: Right;  Right total shoulder arthroplasty  . TUBAL LIGATION  1988    Family History  Problem Relation Age of Onset  . Arthritis Mother   . Heart disease Mother        ?psvt  . Breast cancer Mother 72       TAH/BSO  . Cancer Mother   . Mental illness Mother   . COPD Father   . Hypertension Father   . Alcohol abuse Father   . Mental illness Father   . Heart disease Father   . Healthy Daughter   . Breast cancer Maternal Aunt 27       deceased  . Cancer Cousin 84       female; unknown primary  . Colon cancer Paternal Aunt 14       deceased at 41  . Stomach cancer Paternal Uncle 36       deceased at 5  . Alcohol abuse Brother   . Cancer Brother   . Hodgkin's lymphoma Brother   . Mental illness Brother   . HIV Brother   . Healthy Brother   . Healthy Son    Social History:  reports that she quit smoking about 12 years ago. Her smoking use included cigarettes. She has a 10.00 pack-year smoking history. She has never used smokeless tobacco. She reports current alcohol use. She reports current drug use. Drug: Marijuana.  Allergies:  Allergies  Allergen Reactions  . Aspirin Swelling    Throat swells  . Bee Venom Anaphylaxis  . Ibuprofen Swelling    Throat swells  . Ativan [Lorazepam] Other (See Comments)    Agitation/confusion   . Ketamine Other (See Comments)    Hallucinations  . Toradol [Ketorolac Tromethamine] Itching, Swelling and Rash  . Tramadol Hcl Itching, Swelling and Rash    Tolerates Dilaudid 06/2016.  TDD.    Medications Prior to Admission  Medication Sig Dispense Refill  . alendronate (FOSAMAX) 35 MG tablet Take 1 tablet (35 mg total) by mouth every 7 (seven) days. Take with a full glass of water on an empty stomach. 12 tablet 0  . ALPRAZolam (XANAX XR) 1 MG 24 hr tablet Take 1 mg by mouth 2 (two) times daily as needed for anxiety.     [name] amphetamine-dextroamphetamine (ADDERALL XR) 30 MG 24  hr capsule Take 30 mg by mouth 2 (two) times daily.     [name] lisinopril (PRINIVIL,ZESTRIL) 10 MG tablet TAKE 1 AND 1/2 TABLETS DAILY BY MOUTH (Patient taking differently: Take 10 mg by mouth daily. ) 45 tablet 1  . predniSONE (DELTASONE) 5 MG tablet Take 5 mg by mouth daily with breakfast.    . thiamine 100 MG tablet Take 100 mg by mouth 2 (two) times daily.    . traZODone (DESYREL) 100 MG tablet Take 1 tablet (100 mg total) by mouth at bedtime as needed for sleep. (Patient taking differently: Take 100 mg by mouth at bedtime. ) 30 tablet  1  . tretinoin (RETIN-A) 0.1 % cream Apply 1 application topically at bedtime as needed (facial blemishes).   3  . venlafaxine XR (EFFEXOR-XR) 75 MG 24 hr capsule Take 75 mg by mouth daily with breakfast.    . zolpidem (AMBIEN) 10 MG tablet Take 10 mg by mouth at bedtime as needed for sleep.    . Biotin 1610910000 MCG TABS Take 10,000 mcg by mouth daily.    . cholecalciferol (VITAMIN D) 1000 units tablet Take 1,000 Units by mouth daily.     . diclofenac sodium (VOLTAREN) 1 % GEL Apply 1 application topically 2 (two) times daily as needed (joint pain).     . folic acid (FOLVITE) 800 MCG tablet Take 800 mcg by mouth daily.    [name]. Lysine 1000 MG TABS Take 1,000 mg by mouth daily.    . magnesium oxide (MAG-OX) 400 MG tablet Take 400 mg by mouth daily.    . meloxicam (MOBIC) 15 MG tablet TAKE 1 TABLET (15 MG) BY MOUTH EVERY DAY (Patient taking differently: Take 15 mg by mouth daily. ) 30 tablet 1  . methocarbamol (ROBAXIN) 500 MG tablet Take 1 tablet (500 mg total) by mouth 2 (two) times daily. (Patient taking differently: Take 500 mg by mouth 2 (two) times daily as needed for muscle spasms. ) 10 tablet 0  . OVER THE COUNTER MEDICATION Take 1 tablet by mouth daily. Bone Strength    . prazosin (MINIPRESS) 2 MG capsule Take 1 capsule (2 mg total) by mouth 2 (two) times daily. (Patient taking differently: Take 2 mg by mouth 2 (two) times daily. ) 60 capsule 1  . venlafaxine XR  (EFFEXOR-XR) 150 MG 24 hr capsule Take 1 capsule (150 mg total) by mouth daily with breakfast. (Patient taking differently: Take 75 mg by mouth daily with breakfast. ) 30 capsule 1    No results found for this or any previous visit (from the past 48 hour(s)). No results found.  Review of Systems  Constitutional: Negative.   HENT: Negative.   Eyes: Negative.   Respiratory: Negative.   Cardiovascular: Negative.   Gastrointestinal: Negative.   Genitourinary: Negative.   Musculoskeletal: Positive for back pain.  Skin: Negative.   Neurological: Positive for focal weakness.  Endo/Heme/Allergies: Negative.   Psychiatric/Behavioral: Negative.     Blood pressure 111/76, pulse 68, temperature 98.1 F (36.7 C), temperature source Oral, resp. rate 20, height 5\' 2"  (1.575 m), weight 61.2 kg, SpO2 100 %. Physical Exam  Constitutional: She is oriented to person, place, and time. She appears well-developed and well-nourished.  HENT:  Head: Normocephalic and atraumatic.  Eyes: Pupils are equal, round, and reactive to light. Conjunctivae are normal.  Neck: Normal range of motion. Neck supple.  Cardiovascular: Normal rate and regular rhythm.  Respiratory: Effort normal and breath sounds normal.  GI: Soft. Bowel sounds are normal.  Musculoskeletal:     Comments: Positive straight leg raising at 30 degrees Patrick's maneuver is negative bilaterally.  Neurological: She is alert and oriented to person, place, and time.  Some deep tendon reflexes in the patellae and the Achilles.  Quadricep strength is graded at 4- out of 5 iliopsoas strength is 4 out of 5 tibialis anterior and gastroc strength is 5 out of 5.  Tone and bulk appear intact.  Upper extremity strength and reflexes are normal cranial nerve examination is normal gait reveals a moderately wide-based.  Skin: Skin is warm and dry.  Psychiatric: She has a normal mood and affect.  Her behavior is normal. Judgment and thought content normal.      Assessment/Plan Spondylosis with retrolisthesis L2-L3, spinal canal stenosis, neurogenic claudication, lumbar radiculopathy.  History of fusion L3 to the sacrum  Plan: Decompression and fusion L2-L3 with anterolateral technique.  Stefani Dama, MD 06/11/2018, 12:26 PM

## 2018-06-11 NOTE — Progress Notes (Signed)
Upon entering room saw patient up walking around room without back brace in place.Educated patient that she is only to be out of bed with assistance from staff and that her back brace has to be on.Assisted patient back to bed and bed alarm on for safety.Patient verbalized understanding to call for assistance before getting out of bed.

## 2018-06-11 NOTE — Progress Notes (Signed)
Patient ID: Kara [name], female   DOB: Jan 21, 1956, 63 y.o.   MRN: [id number] Alert with some discomfort in the left flank.  Motor function appears to be intact in lower extremities.  Dressings are clean and dry.  Stable postop

## 2018-06-11 NOTE — Anesthesia Postprocedure Evaluation (Signed)
Anesthesia Post Note  Patient: Kara [name]  Procedure(s) Performed: Left Lumbar two-three Anterolateral decompression/fusion/lateral plate fixation (Left )     Patient location during evaluation: PACU Anesthesia Type: General Level of consciousness: awake and alert Pain management: pain level controlled Vital Signs Assessment: post-procedure vital signs reviewed and stable Respiratory status: spontaneous breathing, nonlabored ventilation, respiratory function stable and patient connected to nasal cannula oxygen Cardiovascular status: blood pressure returned to baseline and stable Postop Assessment: no apparent nausea or vomiting Anesthetic complications: no    Last Vitals:  Vitals:   06/11/18 2009 06/11/18 2025  BP: (!) 143/76 138/83  Pulse: 88   Resp: 16   Temp: 36.6 C 36.9 C  SpO2: 99% 100%    Last Pain:  Vitals:   06/11/18 2025  TempSrc: Oral  PainSc:                  Catheryn Bacon Novalynn Branaman

## 2018-06-11 NOTE — Progress Notes (Signed)
Pt placed on bed pan and able to void 5-70mls post foley removal.

## 2018-06-11 NOTE — Progress Notes (Signed)
Patient woke up suddenly stating that she would like to void. This RN explained to the patient multiple times that she has a urinary catheter in place and that she may relax and void without wetting the bed. Pt adamant regarding her wishes to have foley catheter removed. MD Ellsner paged. Per MD order, foley catheter may be removed. Pt agreeable to new orders.

## 2018-06-11 NOTE — Anesthesia Procedure Notes (Signed)
Procedure Name: Intubation Date/Time: 06/11/2018 1:17 PM Performed by: Waynard Edwards, CRNA Pre-anesthesia Checklist: Patient identified, Emergency Drugs available, Patient being monitored and Suction available Patient Re-evaluated:Patient Re-evaluated prior to induction Oxygen Delivery Method: Circle system utilized Preoxygenation: Pre-oxygenation with 100% oxygen Induction Type: IV induction Ventilation: Mask ventilation without difficulty Laryngoscope Size: Miller and 2 Grade View: Grade II Tube type: Oral Tube size: 7.0 mm Number of attempts: 1 Airway Equipment and Method: Stylet Placement Confirmation: ETT inserted through vocal cords under direct vision,  positive ETCO2 and breath sounds checked- equal and bilateral Secured at: 22 cm Tube secured with: Tape Dental Injury: Teeth and Oropharynx as per pre-operative assessment

## 2018-06-11 NOTE — Progress Notes (Signed)
Patient yelling in room can be heard out in halls.Upon entering room patient states," You don't understand the amount of pain I'm in.I need more pain medication.My pain is going to be worse than anybody else because I have fibromyalgia and other health issues."Educated patient that pain medication can only be given as prescribed by doctor and that this nurse would do her best to try to manage her pain .

## 2018-06-11 NOTE — Op Note (Signed)
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  Lumbar radiculopathy, neurogenic claudication.  History of decompression and arthrodesis L3 to the sacrum with hardware. Postoperative diagnosis: Same Procedure: Anterolateral decompression L2-L3 with ex lift spacer allograft and infuse.  Lateral plate fixation [id number], neuro monitoring. Surgeon: Barnett Abu First assistant: Ervin Knack, MD Anesthesia: General endotracheal Indications: Kara [name] is a 63 year old individual whose had previous lumbar surgeries in the lumbar spine for decompression and stabilization from L3 to the sacrum.  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  She is been advised regarding the need for surgery to decompress and stabilize L2-L3.  Procedure: Patient was brought to the operating room supine on the stretcher.  After the smooth induction of general endotracheal anesthesia, she was carefully turned into the right lateral decubitus position.  Orthogonal allergy was checked with radiography and when this was verified the patient was taped into place on the operating table.  The break was open so as to allow expansion of the lateral aspect of the disc space at L2-L3.  Then the skin was marked for an incision directly over the L2-3 interspace.  Skin was prepped with alcohol DuraPrep and draped in a sterile fashion lateral incision was then created and carried down through the subcutaneous tissues.  A second incision was created posterior Nedra Hai and this was used to enter the retroperitoneal space with a blunt hemostat.  The finger was then admitted into the retroperitoneal space and a guide tube was then passed through the lateral incision into the retroperitoneal space and then docked in the lateral vertebrae through the psoas muscle which was thinned in this region.  The first identifiable disc space was noted to  be that of L2-L3.  A K wire was then passed into the space neuro monitoring was performed to make sure no elements of the lumbar plexus were nearby none were identified.  A series of dilators were passed all the time performing neuro monitoring.  Then 110 mm deep self-retaining retractor was placed into the space and gradually dilated.  The posterior portion of the dissection was checked for any neural elements were none were identified a shim was placed into the disc space at L2-L3 under radiographic supervision.  Then the retractor was opened and the lateral portion of the disc space was cleared of remaining pieces of the psoas muscle.  The space was entered and a significant discectomy at L2-3 was performed with a very minimal amount of severely degenerated and desiccated disc material being removed the space was then entered with a series of dilators and the interspace was dilated to the 8 mm size attempts at placing a 10 mm lordotic spacer to see if further dilatation could be obtained were unsuccessful despite several attempts and thorough cleansing of the disc space ultimately an 8 x 22 mm x 50 mm spacer was placed into the interspace after the endplates were well decorticated the spacer was filled with cancellus chips of allograft and an extra small bit of infuse.  Once the spacer was placed and final radiographs obtained good verification of its positioning and 8 mm tall lateral plate was fitted to the L2-L3 vertebral bodies with 250 mm 5.5 diameter screws.  Final radiographs were then obtained in the AP and lateral projection screws were locked to the plate.  Hemostasis was checked in the soft tissues and then the retractor was removed and ultimately the subcutaneous  tissue was closed with 2-0 Vicryl in interrupted fashion and 3-0 Vicryl was used subcuticularly to close both incisions.  Blood loss for the procedure was estimated less than 100 cc.  Patient tolerated procedure well is returned to recovery  room stable condition.

## 2018-06-12 ENCOUNTER — Encounter (HOSPITAL_COMMUNITY): Payer: Self-pay | Admitting: Neurological Surgery

## 2018-06-12 MED ORDER — DEXAMETHASONE 4 MG PO TABS
4.0000 mg | ORAL_TABLET | Freq: Two times a day (BID) | ORAL | Status: DC
  Administered 2018-06-12 – 2018-06-13 (×3): 4 mg via ORAL
  Filled 2018-06-12 (×3): qty 1

## 2018-06-12 MED ORDER — PREDNISONE 5 MG PO TABS: 5.0000 mg | ORAL_TABLET | Freq: Every day | ORAL | Status: DC

## 2018-06-12 MED ORDER — HYDROMORPHONE HCL 2 MG PO TABS
2.0000 mg | ORAL_TABLET | ORAL | Status: DC | PRN
  Administered 2018-06-12: 4 mg via ORAL
  Administered 2018-06-12: 2 mg via ORAL
  Administered 2018-06-13: 4 mg via ORAL
  Administered 2018-06-13: 2 mg via ORAL
  Administered 2018-06-13: 4 mg via ORAL
  Filled 2018-06-12: qty 2
  Filled 2018-06-12: qty 1
  Filled 2018-06-12: qty 2
  Filled 2018-06-12: qty 1
  Filled 2018-06-12: qty 2

## 2018-06-12 MED ORDER — HYDROMORPHONE HCL 1 MG/ML IJ SOLN
1.0000 mg | INTRAMUSCULAR | Status: DC | PRN
  Administered 2018-06-12 (×3): 1 mg via INTRAVENOUS
  Filled 2018-06-12 (×4): qty 1

## 2018-06-12 NOTE — Evaluation (Addendum)
Occupational Therapy Evaluation Patient Details Name: Kara [name] MRN: [id number] DOB: July 03, 1955 Today's Date: 06/12/2018    History of Present Illness [age] year old individual who has had previous decompression fusion from L3-S1 has been done in several different operations.  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  She has developed progressive pain and weakness in her proximal lower extremities.  Recent myelogram demonstrated a severity of a nearly complete myelographic block at L2-L3. s/p Anterolateral decompression L2-L3 with ex lift spacer allograft and infuse.  Lateral plate fixation [id number], 06/11/18   Clinical Impression   This 63 y/o female presents with the above. At baseline pt is independent with basic ADL and functional mobility (short distances), reports spouse was assisting with iADL. Pt presents seated EOB having completed working with PT, agreeable to OT treatment session. Pt completing room level mobility with overall light minA. She currently requires minA for UB and LB ADL. Pt requires MAX education and reenforcement of back precautions during functional tasks, often impulsive with mobility and requires cues for safety and to maintain precautions as pt with poor carryover of precautions despite frequent cues. She reports spouse will be able to assist with ADL/iADL after return home. Pt will benefit from continued OT services in both acute and HH settings to maximize her safety and independence with ADL and mobility while maintaining back precautions. Will follow.     Follow Up Recommendations  Home health OT;Supervision/Assistance - 24 hour    Equipment Recommendations  None recommended by OT           Precautions / Restrictions Precautions Precautions: Back Precaution Booklet Issued: Yes (comment) Precaution Comments: pt educated on  back precautions, unable to recall after session reeducated with teach back Required Braces or Orthoses: Spinal Brace Spinal Brace: Lumbar corset;Applied in supine position Restrictions Weight Bearing Restrictions: No      Mobility Bed Mobility Overal bed mobility: Modified Independent             General bed mobility comments: seated EOB with PT upon arrival  Transfers Overall transfer level: Needs assistance Equipment used: None Transfers: Sit to/from Stand Sit to Stand: Supervision         General transfer comment: supervision for safety and immediate standing balance    Balance Overall balance assessment: Mild deficits observed, not formally tested                                         ADL either performed or assessed with clinical judgement   ADL Overall ADL's : Needs assistance/impaired Eating/Feeding: Independent;Sitting   Grooming: Min guard;Standing   Upper Body Bathing: Min guard;Sitting   Lower Body Bathing: Minimal assistance;Sit to/from stand   Upper Body Dressing : Minimal assistance;Sitting Upper Body Dressing Details (indicate cue type and reason): will benefit from continued review of brace management Lower Body Dressing: Minimal assistance;Sit to/from stand Lower Body Dressing Details (indicate cue type and reason): pt is able to perform figure 4 technique for LB ADL however requires cues to carryover and utilize Toilet Transfer: Minimal assistance;Ambulation Toilet Transfer Details (indicate cue type and reason): simulated via transfer to recliner Toileting- Clothing Manipulation and Hygiene: Minimal assistance;Sit to/from stand       Functional mobility during ADLs: Minimal assistance General ADL Comments: pt somewhat impulsive and with decreased  carryover of back precautions despite education provided, will benefit from continued education and review                         Pertinent Vitals/Pain Pain  Assessment: Faces Pain Score: 7  Faces Pain Scale: Hurts even more Pain Location: L hip pain with radicular pain down leg Pain Descriptors / Indicators: Sharp;Shooting Pain Intervention(s): Limited activity within patient's tolerance;Monitored during session;Repositioned     Hand Dominance     Extremity/Trunk Assessment Upper Extremity Assessment Upper Extremity Assessment: LUE deficits/detail LUE Deficits / Details: limited ROM due to baseline shoulder pain   Lower Extremity Assessment Lower Extremity Assessment: Defer to PT evaluation LLE Deficits / Details: L hip pain with sharp, shooting nerve pain down into L thigh   Cervical / Trunk Assessment Cervical / Trunk Assessment: Other exceptions Cervical / Trunk Exceptions: s/p surgical fusion    Communication Communication Communication: No difficulties   Cognition Arousal/Alertness: Awake/alert Behavior During Therapy: Impulsive;Anxious Overall Cognitive Status: Impaired/Different from baseline Area of Impairment: Safety/judgement;Awareness;Problem solving;Memory;Following commands                     Memory: Decreased recall of precautions Following Commands: Follows one step commands inconsistently Safety/Judgement: Decreased awareness of safety;Decreased awareness of deficits Awareness: Emergent Problem Solving: Slow processing;Requires verbal cues;Requires tactile cues General Comments: pt with poor insight into surgery and its precautions, and safety required; frequent cueing for following precautions   General Comments       Exercises     Shoulder Instructions      Home Living Family/patient expects to be discharged to:: Private residence Living Arrangements: Spouse/significant other Available Help at Discharge: Family;Available 24 hours/day Type of Home: House Home Access: Stairs to enter [employer] of Steps: 2 Entrance Stairs-Rails: Left Home Layout: One level     Bathroom  Shower/Tub: IT trainer: Standard Bathroom Accessibility: Yes   Home Equipment: Shower seat;Hand held shower head          Prior Functioning/Environment Level of Independence: Needs assistance  Gait / Transfers Assistance Needed: limited to household distances ADL's / Homemaking Assistance Needed: independent in ADLs, husband does cooking, Research scientist (life sciences)            OT Problem List: Decreased strength;Decreased range of motion;Decreased activity tolerance;Impaired balance (sitting and/or standing);Decreased cognition;Decreased safety awareness;Decreased knowledge of precautions;Decreased knowledge of use of DME or AE;Pain      OT Treatment/Interventions: Self-care/ADL training;Therapeutic exercise;Neuromuscular education;DME and/or AE instruction;Therapeutic activities;Patient/family education;Balance training;Visual/perceptual remediation/compensation    OT Goals(Current goals can be found in the care plan section) Acute Rehab OT Goals Patient Stated Goal: less pain OT Goal Formulation: With patient Time For Goal Achievement: 06/26/18 Potential to Achieve Goals: Good  OT Frequency: Min 2X/week   Barriers to D/C:            Co-evaluation              AM-PAC OT "6 Clicks" Daily Activity     Outcome Measure Help from another person eating meals?: None Help from another person taking care of personal grooming?: A Little Help from another person toileting, which includes using toliet, bedpan, or urinal?: A Little Help from another person bathing (including washing, rinsing, drying)?: A Lot Help from another person to put on and taking off regular upper body clothing?: A Little Help from another person to put on and taking off regular lower body clothing?: A Lot  6 Click Score: 17   End of Session Equipment Utilized During Treatment: Back brace Nurse Communication: Mobility status  Activity Tolerance: Patient tolerated treatment  well Patient left: in chair;with call bell/phone within reach;with chair alarm set  OT Visit Diagnosis: Unsteadiness on feet (R26.81);Pain Pain - Right/Left: Left Pain - part of body: Hip(and back)                Time: 5625-6389 OT Time Calculation (min): 24 min Charges:  OT General Charges $OT Visit: 1 Visit OT Evaluation $OT Eval Low Complexity: 1 Low OT Treatments $Self Care/Home Management : 8-22 mins  Marcy Siren, OT Supplemental Rehabilitation Services Pager [phone] Office [phone]   Orlando Penner 06/12/2018, 3:29 PM

## 2018-06-12 NOTE — Progress Notes (Signed)
Patient walked up to nurse's desk without back brace on and assistance from staff asking for pain medication.Patient then walked back to her room and sat in chair.Patient yelling ,"You don't understand the amount of pain I'm in.I called for pain medication five minutes ago." This nurse explained to patient that was helping another patient.Patient stated,"Give me my pain medication and then get the [facility] out of my room."

## 2018-06-12 NOTE — Progress Notes (Signed)
Patient ambulating in hall with husband

## 2018-06-12 NOTE — Progress Notes (Signed)
Call placed to answering service for on call MD awaiting a response.

## 2018-06-12 NOTE — Progress Notes (Signed)
Orthopedic Tech Progress Note Patient Details:  Kara [name] [birth year] [id number] RN said patient has brace  Patient ID: Kara [name], female   DOB: [birth year], 63 y.o.   MRN: [id number]   Donald Pore 06/12/2018, 8:46 AM

## 2018-06-12 NOTE — Progress Notes (Addendum)
Patient ambulated in hall with Charge nurse [place].

## 2018-06-12 NOTE — Progress Notes (Signed)
PT Evaluation   Clinical Impression: Patient is s/p above surgery resulting in functional limitations due to the deficits listed below (see PT Problem List). Pt is limited in safe mobility by decreased safety awareness, decreased knowledge of precautions, increased pain and instability. Pt requires mod I for bed mobility, supervision for transfers, min guard for ambulation and minA for ascent/descent of 3 steps.   Patient will benefit from skilled PT to increase their independence and safety with mobility to allow discharge to the venue listed below.      06/12/18 1400  PT Visit Information  Last PT Received On 06/12/18  Assistance Needed +1  History of Present Illness 63 year old individual who has had previous decompression fusion from L3-S1 has been done in several different operations.  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  She has developed progressive pain and weakness in her proximal lower extremities.  Recent myelogram demonstrated a severity of a nearly complete myelographic block at L2-L3. s/p Anterolateral decompression L2-L3 with ex lift spacer allograft and infuse.  Lateral plate fixation [id number], 06/11/18  Precautions  Precautions Back  Precaution Booklet Issued Yes (comment)  Precaution Comments pt educated on back precautions, unable to recall after session reeducated with teach back  Required Braces or Orthoses Spinal Brace  Spinal Brace Lumbar corset;Applied in supine position  Restrictions  Weight Bearing Restrictions No  Home Living  Family/patient expects to be discharged to: Private residence  Living Arrangements Spouse/significant other  Available Help at Discharge Family;Available 24 hours/day  Type of Home House  Home Access Stairs to enter  Entrance Stairs-Number of Steps 2  Entrance Stairs-Rails Left  Home Layout One level   Bathroom Shower/Tub Tub/shower unit;Curtain  Horticulturist, commercial Yes  Home Equipment Tub bench  Prior Function  Level of Independence Needs assistance  Gait / Transfers Assistance Needed limited to household distances  ADL's / Homemaking Assistance Needed independent in ADLs, husband does cooking, Doctor, general practice  Communication No difficulties  Pain Assessment  Pain Assessment 0-10  Pain Score 7  Pain Location L hip pain with radicular pain down leg  Pain Descriptors / Indicators Sharp;Shooting  Pain Intervention(s) Limited activity within patient's tolerance;Monitored during session;Repositioned  Cognition  Arousal/Alertness Awake/alert  Behavior During Therapy Impulsive;Anxious  Overall Cognitive Status Impaired/Different from baseline  Area of Impairment Safety/judgement;Awareness;Problem solving;Memory;Following commands  Memory Decreased recall of precautions  Following Commands Follows one step commands inconsistently  Safety/Judgement Decreased awareness of safety;Decreased awareness of deficits  Awareness Emergent  Problem Solving Slow processing;Requires verbal cues;Requires tactile cues  General Comments pt with poor insight into surgery and its precautions, and safety required  Upper Extremity Assessment  Upper Extremity Assessment Defer to OT evaluation  Lower Extremity Assessment  Lower Extremity Assessment LLE deficits/detail  LLE Deficits / Details L hip pain with sharp, shooting nerve pain down into L thigh  Cervical / Trunk Assessment  Cervical / Trunk Assessment Other exceptions  Cervical / Trunk Exceptions s/p surgical fusion   Bed Mobility  Overal bed mobility Modified Independent  General bed mobility comments use of bed rail to come to EoB  Transfers  Overall transfer level Needs assistance  Equipment used None  Transfers Sit to/from Stand  Sit to Stand Supervision  General transfer comment supervision for  safety, minor instability in standing which she could self correct  Ambulation/Gait  Ambulation/Gait assistance Min  guard  Gait Distance (Feet) 400 Feet  Assistive device None  Gait Pattern/deviations Step-through pattern;Decreased weight shift to left;Antalgic  General Gait Details hands on min guard for safety, no overt LoB but mild instability throughout, decreased L LE weightbearing as ambulation progressed, pt with c/o sharp shooting pains in thigh  Gait velocity slowed  Gait velocity interpretation >2.62 ft/sec, indicative of community ambulatory  Stairs Yes  Stairs assistance Min assist  Stair Management One rail Left;Forwards  Number of Stairs 3  General stair comments minA for steadying with ascent/descent of 3 steps , vc for sequencing to reduce weightbearing on L LE, despite cuing pt performed step over step   Balance  Overall balance assessment Mild deficits observed, not formally tested  General Comments  General comments (skin integrity, edema, etc.) Pt is very impulsive, and argumentative when pressed about not wearing her brace when she is out of bed, then reports that she is not able to put on her brace in sitting and stands up to put it on.   PT - End of Session  Equipment Utilized During Treatment Back brace  Activity Tolerance Patient limited by pain  Patient left Other (comment) (left with OT to clean up )  Nurse Communication Mobility status;Precautions;Weight bearing status  PT Assessment  PT Recommendation/Assessment Patient needs continued PT services  PT Visit Diagnosis Unsteadiness on feet (R26.81);Other abnormalities of gait and mobility (R26.89);Muscle weakness (generalized) (M62.81);Difficulty in walking, not elsewhere classified (R26.2);Other symptoms and signs involving the nervous system (R29.898);Pain  Pain - Right/Left Left  Pain - part of body Hip  PT Problem List Decreased balance;Decreased mobility;Decreased safety awareness;Decreased knowledge of  use of DME;Decreased knowledge of precautions;Pain  PT Plan  PT Frequency (ACUTE ONLY) Min 4X/week  PT Treatment/Interventions (ACUTE ONLY) DME instruction;Gait training;Stair training;Functional mobility training;Therapeutic activities;Therapeutic exercise;Balance training;Cognitive remediation;Patient/family education  AM-PAC PT "6 Clicks" Mobility Outcome Measure (Version 2)  Help needed turning from your back to your side while in a flat bed without using bedrails? 4  Help needed moving from lying on your back to sitting on the side of a flat bed without using bedrails? 4  Help needed moving to and from a bed to a chair (including a wheelchair)? 3  Help needed standing up from a chair using your arms (e.g., wheelchair or bedside chair)? 3  Help needed to walk in hospital room? 3  Help needed climbing 3-5 steps with a railing?  3  6 Click Score 20  Consider Recommendation of Discharge To: Home with no services  PT Recommendation  Follow Up Recommendations No PT follow up;Supervision for mobility/OOB  PT equipment Rolling walker with 5" wheels  Individuals Consulted  Consulted and Agree with Results and Recommendations Patient  Acute Rehab PT Goals  Patient Stated Goal less pain  PT Goal Formulation With patient  Time For Goal Achievement 06/26/18  Potential to Achieve Goals Fair  PT Time Calculation  PT Start Time (ACUTE ONLY) 1415  PT Stop Time (ACUTE ONLY) 1438  PT Time Calculation (min) (ACUTE ONLY) 23 min  PT General Charges  $$ ACUTE PT VISIT 1 Visit  PT Evaluation  $PT Eval Moderate Complexity 1 Mod   Anselmo Reihl B. Beverely Risen PT, DPT Acute Rehabilitation Services Pager [phone] Office [phone]

## 2018-06-12 NOTE — Progress Notes (Signed)
Patient ambulating in hall with nurse Ceasar Mons.

## 2018-06-12 NOTE — Social Work (Signed)
CSW acknowledging consult for SNF placement. Will follow for therapy recommendations.   Vivianne Carles, MSW, LCSWA [facility] Social Work [phone]   

## 2018-06-12 NOTE — Progress Notes (Signed)
Patient husband has arrived to hospital.This nurse spoke with husband about her situation .Patient husband stated," I'm not taking her home tonight.She needs to stay here."

## 2018-06-12 NOTE — Progress Notes (Signed)
Patient yelling and cursing at staff.Patient states,'I need more pain medication .You are not controlling my pain."Patient just recently received pain medication at 0016.Innformed patient of this and patient stated," That's not enough medication.If you can't give me more pain medication I'll just leave and go home." Charge nurse [place] in to talk with patient and went over amount of medication and and time medicine has been given since arrival to this unit.Patient yelled ,"I didn't say you weren't giving me medication.You just aren't giving me enough.Patient threatening to sign self out AMA.Asked patient what plans are for pain pain management if signed self put of hospital.Patient doesn't have answer.Patient then asked that staff call her husband to come get her.Staff asked patient to call her husband patient then stated,"I have been trying to call him he wont answer the phone." This nurse will notify on call MD of situation.

## 2018-06-12 NOTE — Progress Notes (Signed)
Patient arrived to room 4NP11 from pacu and assisted to bed by nursing staff.Oriented patient to call bell system and nursing unit and verbalized understanding.Educated patient to call for help before trying to get to get out of bed for safety and need to apply back brace before ambulating.Patient verbalized understanding.Patient asking for pain medication scores pain 10/10.Will give prn dose of medication.

## 2018-06-12 NOTE — Progress Notes (Signed)
Spoke with Dr.Ostergard concerning patient pain control and threatening to leave AMA.Per MD no plans to change patient current pain management regimen and if determined to leave have her sign paper leaving against medical advice.

## 2018-06-12 NOTE — Progress Notes (Signed)
Patient ID: Kara [name], female   DOB: 1956/03/13, 63 y.o.   MRN: [id number] Vital signs are stable I have reviewed the nurse's notes from last night and this morning regarding the patient's behavior regarding pain medication She has been very difficult to deal with for the nurses I discussed with her husband her pain management and he notes that she has done better with Dilaudid in the past I will change her over to Dilaudid for pain management and will give her a dose of Decadron to see if this helps mitigate her pain She has been up and ambulatory but she complains mostly of bilateral buttock pain The pain does not appear to be coming from the region of the hips as she can abduct the hips without difficulty The incisions are clean and dry We will see how she does with altering her pain management and addition of Decadron.

## 2018-06-12 NOTE — Progress Notes (Signed)
RN with orientee RN discussed plan of care with MD this AM; MD changed patient pain meds; plan to give a few doses of IV dilaudid then change over to oral dilaudid, MD also added decadron to aid in pain management.  Patient educated in changes made to medication regimen each time RN entered room.  IV dilaudid given x2 prior to switching to oral.  Patient started with 2mg  then given 4mg  oral dilaudid. Oral robaxin given throughout day as well.  Patient yelling and cussing at staff this AM.  After IV administration, pt fell asleep. Pt continues to get out of bed or chair without assistance or using brace and walking in halls yelling at staff members.    Orientee RN entered room 1745 to give oral pain medication and after swallowing pt states "I want the liquid, YOU SAID YOU WOULD SWITCH IT", then attempts self induce vomit by sticking fingers down throat "so that I can get the liquid not the pills." Charge RN in room at this time. Pt states "I want a psychiatrist now!" Husband at bedside during this encounter attempting to calm patient. MD office paged regarding pain medication and psych consult.   Orientee RN spoke with NP on call. NP stated to give 1 more of the IV dilaudid and then to be dc'd. After the 1 dose the patient will continue on the oral dilaudid to manage her pain. Patient has been educated that the medications will not take her pain away completely, and the medications are used to help manage her pain to a tolerable level after surgery.

## 2018-06-13 MED ORDER — HYDROMORPHONE HCL 2 MG PO TABS: 2.0000 mg | ORAL_TABLET | ORAL | 0 refills | Status: DC | PRN

## 2018-06-13 MED ORDER — METHOCARBAMOL 500 MG PO TABS: 500.0000 mg | ORAL_TABLET | Freq: Four times a day (QID) | ORAL | 3 refills | Status: DC | PRN

## 2018-06-13 MED ORDER — DEXAMETHASONE 1 MG PO TABS: ORAL_TABLET | ORAL | 0 refills | Status: DC

## 2018-06-13 NOTE — Progress Notes (Signed)
Occupational Therapy Treatment Patient Details Name: Kara DoloresBarbara [name] MRN: [id number] DOB: [birth year] Today's Date: 06/13/2018    History of present illness 63 year old individual who has had previous decompression fusion from L3-S1 has been done in several different operations.  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  She has developed progressive pain and weakness in her proximal lower extremities.  Recent myelogram demonstrated a severity of a nearly complete myelographic block at L2-L3. s/p Anterolateral decompression L2-L3 with ex lift spacer allograft and infuse.  Lateral plate fixation [id number], 06/11/18   OT comments  Pt presents seated EOB without back brace donned, agreeable to OT treatment session. Re-educated pt on importance of wearing brace when sitting up or OOB, pt verbalizing understanding and donning prior to start of session. Pt completing functional mobility without AD and standing grooming ADL with minguard assist throughout. Reviewed additional compensatory strategies for performing ADL/functional transfers while maintaining back precautions. Pt with some improvements in compliance with precautions today though continues to require frequent cueing to reinforce. Pt's spouse also present for session and review of education provided. Updated d/c recommendations as feel pt will not require follow up therapy services at time of discharge given available assist from spouse. Will continue to follow while she remains in acute setting to progress pt towards established OT goals.   Follow Up Recommendations  No OT follow up;Supervision/Assistance - 24 hour    Equipment Recommendations  None recommended by OT          Precautions / Restrictions Precautions Precautions: Back Precaution Booklet Issued: Yes (comment) Precaution Comments: pt educated on  back precautions, able to recall 2/3 this session with cues for recall of third precaution Required Braces or Orthoses: Spinal Brace Spinal Brace: Lumbar corset;Applied in supine position Restrictions Weight Bearing Restrictions: No       Mobility Bed Mobility               General bed mobility comments: sitting EoB without brace on entry. refused to place brace in sitting donning in standing  Transfers Overall transfer level: Needs assistance Equipment used: None Transfers: Sit to/from Stand Sit to Stand: Supervision         General transfer comment: supervision for safety, minor instability in standing which she could self correct and then place brace    Balance Overall balance assessment: Mild deficits observed, not formally tested                                         ADL either performed or assessed with clinical judgement   ADL Overall ADL's : Needs assistance/impaired     Grooming: Min guard;Supervision/safety;Standing;Wash/dry hands           Upper Body Dressing : Min guard;Set up;Standing;Sitting Upper Body Dressing Details (indicate cue type and reason): donning lumbar corset; pt initially not wearing upon arrival to room (pt sitting EOB on computer); educated on importance of wearing brace with up and OOB, pt verbalizing understanding               Tub/Shower Transfer Details (indicate cue type and reason): educated to use shower chair during bathing task for increased safety with pt verbalizing understanding Functional mobility during ADLs: Min guard General ADL Comments: reviewed back precautions, safety and compensatory strategies for completing ADL and  functional transfers while maintaining precautions; pt verbalizing understanding, continues to require cues to maintain with functional tasks but improvements noted from previous session; spouse also present for education and verbalizes understanding; pt reports spouse assisted  with dressing this AM     Vision       Perception     Praxis      Cognition Arousal/Alertness: Awake/alert Behavior During Therapy: Impulsive;Anxious Overall Cognitive Status: Impaired/Different from baseline Area of Impairment: Safety/judgement;Awareness;Problem solving;Memory;Following commands                     Memory: Decreased recall of precautions Following Commands: Follows one step commands inconsistently Safety/Judgement: Decreased awareness of safety;Decreased awareness of deficits Awareness: Emergent Problem Solving: Slow processing;Requires verbal cues;Requires tactile cues General Comments: pt with improvements in compliance with maintaining precautions, though with continued poor insight into surgery and its precautions, and safety required        Exercises     Shoulder Instructions       General Comments spouse present during session    Pertinent Vitals/ Pain       Pain Assessment: Faces Faces Pain Scale: Hurts little more Pain Location: L hip pain with radicular pain down leg Pain Descriptors / Indicators: Sharp;Shooting Pain Intervention(s): Monitored during session;Repositioned  Home Living Family/patient expects to be discharged to:: Private residence Living Arrangements: Spouse/significant other                                      Prior Functioning/Environment              Frequency  Min 2X/week        Progress Toward Goals  OT Goals(current goals can now be found in the care plan section)  Progress towards OT goals: Progressing toward goals  Acute Rehab OT Goals Patient Stated Goal: less pain OT Goal Formulation: With patient Time For Goal Achievement: 06/26/18 Potential to Achieve Goals: Good ADL Goals Pt Will Perform Grooming: with supervision;standing Pt Will Perform Lower Body Bathing: with supervision;sit to/from stand Pt Will Perform Upper Body Dressing: with supervision;with  set-up;sitting Pt Will Perform Lower Body Dressing: with supervision;sit to/from stand Pt Will Transfer to Toilet: with supervision;ambulating Pt Will Perform Toileting - Clothing Manipulation and hygiene: with supervision;sit to/from stand Pt Will Perform Tub/Shower Transfer: Tub transfer;with supervision;ambulating;shower seat Additional ADL Goal #1: Pt will independently recall 3/3 back precautions. Additional ADL Goal #2: Pt will perform ADL task while maintaining back precautions with no more than min cueing.  Plan Discharge plan needs to be updated    Co-evaluation                 AM-PAC OT "6 Clicks" Daily Activity     Outcome Measure   Help from another person eating meals?: None Help from another person taking care of personal grooming?: A Little Help from another person toileting, which includes using toliet, bedpan, or urinal?: A Little Help from another person bathing (including washing, rinsing, drying)?: A Lot Help from another person to put on and taking off regular upper body clothing?: A Little Help from another person to put on and taking off regular lower body clothing?: A Lot 6 Click Score: 17    End of Session Equipment Utilized During Treatment: Back brace  OT Visit Diagnosis: Unsteadiness on feet (R26.81);Pain Pain - Right/Left: Left Pain - part of body: Hip(back)   Activity Tolerance  Patient tolerated treatment well   Patient Left with family/visitor present;with call bell/phone within reach(seated EOB)   Nurse Communication Mobility status        Time: 4967-5916 OT Time Calculation (min): 13 min  Charges: OT General Charges $OT Visit: 1 Visit OT Treatments $Self Care/Home Management : 8-22 mins  Marcy Siren, OT Supplemental Rehabilitation Services Pager [phone] Office [phone]    Orlando Penner 06/13/2018, 2:27 PM

## 2018-06-13 NOTE — Progress Notes (Signed)
Physical Therapy Treatment Patient Details Name: Kara [name] MRN: [id number] DOB: 09/16/1955 Today's Date: 06/13/2018    History of Present Illness 63 year old individual who has had previous decompression fusion from L3-S1 has been done in several different operations.  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  She has developed progressive pain and weakness in her proximal lower extremities.  Recent myelogram demonstrated a severity of a nearly complete myelographic block at L2-L3. s/p Anterolateral decompression L2-L3 with ex lift spacer allograft and infuse.  Lateral plate fixation D3-U2, 06/11/18    PT Comments    Pt is eager to d/c home today. Pt agreeable to ambulation and stair training. Pt continues to have poor insight into protection of surgery with use of brace when out of bed. Pt is currently supervision for transfers and min guard for ambulation of 500 feet and ascent/descent of 4 steps. Pt ready for d/c home.    Follow Up Recommendations  No PT follow up;Supervision for mobility/OOB     Equipment Recommendations  Rolling walker with 5" wheels       Precautions / Restrictions Precautions Precautions: Back Precaution Booklet Issued: Yes (comment) Precaution Comments: pt educated on back precautions, unable to recall after session reeducated with teach back Required Braces or Orthoses: Spinal Brace Spinal Brace: Lumbar corset;Applied in supine position Restrictions Weight Bearing Restrictions: No    Mobility  Bed Mobility               General bed mobility comments: sitting EoB without brace on entry. refused to place brace in sitting   Transfers Overall transfer level: Needs assistance Equipment used: None Transfers: Sit to/from Stand Sit to Stand: Supervision         General transfer comment: supervision for safety,  minor instability in standing which she could self correct and then place brace  Ambulation/Gait Ambulation/Gait assistance: Min guard Gait Distance (Feet): 500 Feet Assistive device: None Gait Pattern/deviations: Step-through pattern;Decreased weight shift to left;Antalgic Gait velocity: slowed   General Gait Details: hands on min guard for safety, no overt LoB but mild instability throughout, decreased L LE weightbearing as ambulation progressed, pt with c/o sharp shooting pains in thigh   Stairs   Stairs assistance: Min guard Stair Management: One rail Left;Forwards Number of Stairs: 4 General stair comments: minA for steadying with ascent/descent of 3 steps , vc for sequencing to reduce weightbearing on L LE, despite cuing pt performed step over step        Balance Overall balance assessment: Mild deficits observed, not formally tested                                          Cognition Arousal/Alertness: Awake/alert Behavior During Therapy: Impulsive;Anxious Overall Cognitive Status: Impaired/Different from baseline Area of Impairment: Safety/judgement;Awareness;Problem solving;Memory;Following commands                     Memory: Decreased recall of precautions Following Commands: Follows one step commands inconsistently Safety/Judgement: Decreased awareness of safety;Decreased awareness of deficits Awareness: Emergent Problem Solving: Slow processing;Requires verbal cues;Requires tactile cues General Comments: pt with continued poor insight into surgery and its precautions, and safety required             Pertinent Vitals/Pain Pain Assessment: 0-10 Faces Pain Scale: Hurts  even more Pain Location: L hip pain with radicular pain down leg Pain Descriptors / Indicators: Sharp;Shooting           PT Goals (current goals can now be found in the care plan section) Acute Rehab PT Goals Patient Stated Goal: less pain PT Goal Formulation:  With patient Time For Goal Achievement: 06/26/18 Potential to Achieve Goals: Fair Progress towards PT goals: Progressing toward goals    Frequency    Min 4X/week      PT Plan Current plan remains appropriate       AM-PAC PT "6 Clicks" Mobility   Outcome Measure  Help needed turning from your back to your side while in a flat bed without using bedrails?: None Help needed moving from lying on your back to sitting on the side of a flat bed without using bedrails?: None Help needed moving to and from a bed to a chair (including a wheelchair)?: A Little Help needed standing up from a chair using your arms (e.g., wheelchair or bedside chair)?: A Little Help needed to walk in hospital room?: A Little Help needed climbing 3-5 steps with a railing? : A Little 6 Click Score: 20    End of Session Equipment Utilized During Treatment: Back brace Activity Tolerance: Patient limited by pain Patient left: Other (comment)(left with OT to clean up ) Nurse Communication: Mobility status;Precautions;Weight bearing status PT Visit Diagnosis: Unsteadiness on feet (R26.81);Other abnormalities of gait and mobility (R26.89);Muscle weakness (generalized) (M62.81);Difficulty in walking, not elsewhere classified (R26.2);Other symptoms and signs involving the nervous system (R29.898);Pain Pain - Right/Left: Left Pain - part of body: Hip     Time: [id number] PT Time Calculation (min) (ACUTE ONLY): 28 min  Charges:  $Gait Training: 23-37 mins                     Kara [name] PT, DPT Acute Rehabilitation Services Pager [phone] Office [phone]    Kara [name] 06/13/2018, 1:13 PM

## 2018-06-13 NOTE — Discharge Summary (Signed)
Physician Discharge Summary  Patient ID: Kara [name] MRN: [id number] DOB/AGE: November 09, 1955 63 y.o.  Admit date: 06/11/2018 Discharge date: 06/13/2018  Admission Diagnoses: Lumbar spondylosis with stenosis L2-L3, lumbar radiculopathy, neurogenic claudication.  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  History of fusion L3 to sacrum.  High opioid tolerance. Active Problems:   Lumbar stenosis with neurogenic claudication   Discharged Condition: good  Hospital Course: Patient was admitted to undergo surgical decompression at L2-L3 via an anterolateral technique.  She tolerated surgery well but initially her pain management was quite difficult.  This improved with switching her to oral Dilaudid and she is been able to be discharged on this medication.  Consults: None  Significant Diagnostic Studies: None   Treatments: surgery: Anterolateral decompression L2-L3 3 lateral plate fixation  Discharge Exam: Blood pressure (!) 98/52, pulse 84, temperature 98.5 F (36.9 C), temperature source Oral, resp. rate 18, height 5\' 2"  (1.575 m), weight 69.8 kg, SpO2 96 %. Incision is clean and dry Station and gait are intact.  Disposition: Discharge disposition: 01-Home or Self Care       Discharge Instructions    Call MD for:  redness, tenderness, or signs of infection (pain, swelling, redness, odor or green/yellow discharge around incision site)   Complete by:  As directed    Call MD for:  severe uncontrolled pain   Complete by:  As directed    Call MD for:  temperature >100.4   Complete by:  As directed    Diet - low sodium heart healthy   Complete by:  As directed    Discharge instructions   Complete by:  As directed    Okay to shower. Do not apply salves or appointments to incision. No heavy lifting with the upper extremities greater than 15 pounds. May resume driving when not requiring pain  medication and patient feels comfortable with doing so.   Incentive spirometry RT   Complete by:  As directed    Increase activity slowly   Complete by:  As directed      Allergies as of 06/13/2018      Reactions   Aspirin Swelling   Throat swells   Bee Venom Anaphylaxis   Ibuprofen Swelling   Throat swells   Ativan [lorazepam] Other (See Comments)   Agitation/confusion   Ketamine Other (See Comments)   Hallucinations   Toradol [ketorolac Tromethamine] Itching, Swelling, Rash   Tramadol Hcl Itching, Swelling, Rash   Tolerates Dilaudid 06/2016.  TDD.      Medication List    TAKE these medications   alendronate 35 MG tablet Commonly known as:  FOSAMAX Take 1 tablet (35 mg total) by mouth every 7 (seven) days. Take with a full glass of water on an empty stomach.   ALPRAZolam 1 MG 24 hr tablet Commonly known as:  XANAX XR Take 1 mg by mouth 2 (two) times daily as needed for anxiety.   amphetamine-dextroamphetamine 30 MG 24 hr capsule Commonly known as:  ADDERALL XR Take 30 mg by mouth 2 (two) times daily.   Biotin 03128 MCG Tabs Take 10,000 mcg by mouth daily.   cholecalciferol 1000 units tablet Commonly known as:  VITAMIN D Take 1,000 Units by mouth daily.   dexamethasone 1 MG tablet Commonly known as:  DECADRON 2 tablets twice daily for 2 days, one tablet twice daily for 2 days, one tablet daily for 2 days.   diclofenac sodium 1 %  Gel Commonly known as:  VOLTAREN Apply 1 application topically 2 (two) times daily as needed (joint pain).   folic acid 800 MCG tablet Commonly known as:  FOLVITE Take 800 mcg by mouth daily.   HYDROmorphone 2 MG tablet Commonly known as:  DILAUDID Take 1-2 tablets (2-4 mg total) by mouth every 3 (three) hours as needed for moderate pain or severe pain.   lisinopril 10 MG tablet Commonly known as:  PRINIVIL,ZESTRIL TAKE 1 AND 1/2 TABLETS DAILY BY MOUTH What changed:    how much to take  how to take this  when to take  this  additional instructions   Lysine 1000 MG Tabs Take 1,000 mg by mouth daily.   magnesium oxide 400 MG tablet Commonly known as:  MAG-OX Take 400 mg by mouth daily.   meloxicam 15 MG tablet Commonly known as:  MOBIC TAKE 1 TABLET (15 MG) BY MOUTH EVERY DAY What changed:    how much to take  how to take this  when to take this  additional instructions   methocarbamol 500 MG tablet Commonly known as:  ROBAXIN Take 1 tablet (500 mg total) by mouth 2 (two) times daily. What changed:    when to take this  reasons to take this   methocarbamol 500 MG tablet Commonly known as:  ROBAXIN Take 1 tablet (500 mg total) by mouth every 6 (six) hours as needed for muscle spasms. What changed:  You were already taking a medication with the same name, and this prescription was added. Make sure you understand how and when to take each.   OVER THE COUNTER MEDICATION Take 1 tablet by mouth daily. Bone Strength   prazosin 2 MG capsule Commonly known as:  MINIPRESS Take 1 capsule (2 mg total) by mouth 2 (two) times daily.   predniSONE 5 MG tablet Commonly known as:  DELTASONE Take 5 mg by mouth daily with breakfast.   thiamine 100 MG tablet Take 100 mg by mouth 2 (two) times daily.   traZODone 100 MG tablet Commonly known as:  DESYREL Take 1 tablet (100 mg total) by mouth at bedtime as needed for sleep. What changed:  when to take this   tretinoin 0.1 % cream Commonly known as:  RETIN-A Apply 1 application topically at bedtime as needed (facial blemishes).   venlafaxine XR 75 MG 24 hr capsule Commonly known as:  EFFEXOR-XR Take 75 mg by mouth daily with breakfast. What changed:  Another medication with the same name was changed. Make sure you understand how and when to take each.   venlafaxine XR 150 MG 24 hr capsule Commonly known as:  EFFEXOR-XR Take 1 capsule (150 mg total) by mouth daily with breakfast. What changed:  how much to take   zolpidem 10 MG  tablet Commonly known as:  AMBIEN Take 10 mg by mouth at bedtime as needed for sleep.        Signed: Shary Key Bonetta Mostek 06/13/2018, 10:29 AM

## 2018-06-13 NOTE — Progress Notes (Signed)
Pt discharged to car with husband present, all belongings including cell phone, and walker. AVS reviewed along with medications. Patient had no questions, verbalized understanding of discharge instructions as well as AVS. IV removed. Gabriel Cirri RN

## 2018-06-13 NOTE — TOC Transition Note (Signed)
Transition of Care [facility]) - CM/SW Discharge Note   Patient Details  Name: Kara [name] MRN: [id number] Date of Birth: 1956-02-19  [facility] CM/SW Contact:  Glennon Mac, RN Phone Number: 06/13/2018, 2:42 PM   Clinical Narrative:   63 year old individual who has had previous decompression fusion from L3-S1 has been done in several different operations.  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  She has developed progressive pain and weakness in her proximal lower extremities.  Recent myelogram demonstrated a severity of a nearly complete myelographic block at L2-L3. s/p Anterolateral decompression L2-L3 with ex lift spacer allograft and infuse.  Lateral plate fixation Z5-G3, 06/11/18.  PTA, pt independent, lives at home with spouse.  PT/OT recommending HH follow up, and pt agreeable to services.  Referral to [facility], per pt choice; start of care 24-48h post dc date.  Pt requests RW for home use; referal to [facility] for DME needs.      Final next level of care: Home w Home Health Services Barriers to Discharge: No Barriers Identified   Patient Goals and CMS Choice Patient states their goals for this hospitalization and ongoing recovery are:: To get home with home health CMS Medicare.gov Compare Post Acute Care list provided to:: Patient Choice offered to / list presented to : Patient  Discharge Placement                       Discharge Plan and Services   Discharge Planning Services: CM Consult Post Acute Care Choice: Home Health          DME Arranged: Walker rolling DME Agency: [facility] HH Arranged: PT, OT HH Agency: [facility] ([facility]   Social Determinants of Health (SDOH) Interventions     Readmission Risk Interventions No flowsheet data found.  Quintella Baton, RN, BSN   Trauma/Neuro ICU Case Manager [phone]

## 2018-07-18 ENCOUNTER — Telehealth: Payer: Medicare Other | Admitting: Physician Assistant

## 2018-07-18 DIAGNOSIS — R300 Dysuria: Secondary | ICD-10-CM

## 2018-07-18 MED ORDER — CEPHALEXIN 500 MG PO CAPS: 500.0000 mg | ORAL_CAPSULE | Freq: Two times a day (BID) | ORAL | 0 refills | Status: AC

## 2018-07-18 NOTE — Progress Notes (Signed)

## 2018-07-18 NOTE — Progress Notes (Signed)
I have spent 5 minutes in review of e-visit questionnaire, review and updating patient chart, medical decision making and response to patient.   Dierdre Mccalip Cody Keyontae Huckeby, PA-C    

## 2018-07-21 ENCOUNTER — Telehealth: Payer: 59 | Admitting: Physician Assistant

## 2018-07-21 DIAGNOSIS — B001 Herpesviral vesicular dermatitis: Secondary | ICD-10-CM

## 2018-07-21 MED ORDER — VALACYCLOVIR HCL 1 G PO TABS: 2000.0000 mg | ORAL_TABLET | Freq: Two times a day (BID) | ORAL | 0 refills | Status: DC

## 2018-07-21 NOTE — Progress Notes (Signed)
We are sorry that you are not feeling well.  Here is how we plan to help!  Based on what you have shared with me it does look like you have a viral infection.    Most cold sores or fever blisters are small fluid filled blisters around the mouth caused by herpes simplex virus.  The most common strain of the virus causing cold sores is herpes simplex virus 1.  It can be spread by skin contact, sharing eating utensils, or even sharing towels.  Cold sores are contagious to other people until dry. (Approximately 5-7 days).  Wash your hands. You can spread the virus to your eyes through handling your contact lenses after touching the lesions.  Most people experience pain at the sight or tingling sensations in their lips that may begin before the ulcers erupt.  Herpes simplex is treatable but not curable.  It may lie dormant for a long time and then reappear due to stress or prolonged sun exposure.  Many patients have success in treating their cold sores with an over the counter topical called Abreva.  You may apply the cream up to 5 times daily (maximum 10 days) until healing occurs.  If you would like to use an oral antiviral medication to speed the healing of your cold sore, I have sent a prescription to your local pharmacy Valacyclovir 2 gm twice daily for 1 day.    HOME CARE:  Wash your hands frequently. Do not pick at or rub the sore. Don't open the blisters. Avoid kissing other people during this time. Avoid sharing drinking glasses, eating utensils, or razors. Do not handle contact lenses unless you have thoroughly washed your hands with soap and warm water! Avoid oral sex during this time.  Herpes from sores on your mouth can spread to your partner's genital area. Avoid contact with anyone who has eczema or a weakened immune system. Cold sores are often triggered by exposure to intense sunlight, use a lip balm containing a sunscreen (SPF 30 or higher).  GET HELP RIGHT AWAY IF:  Blisters  look infected. Blisters occur near or in the eye. Symptoms last longer than 10 days. Your symptoms become worse.  MAKE SURE YOU:  Understand these instructions. Will watch your condition. Will get help right away if you are not doing well or get worse.    Your e-visit answers were reviewed by a board certified advanced clinical practitioner to complete your personal care plan.  Depending upon the condition, your plan could have  Included both over the counter or prescription medications.    Please review your pharmacy choice.  Be sure that the pharmacy you have chosen is open so that you can pick up your prescription now.  If there is a problem you can message your provider in MyChart to have the prescription routed to another pharmacy.    Your safety is important to [place].  If you have drug allergies check our prescription carefully.  For the next 24 hours you can use MyChart to ask questions about today's visit, request a non-urgent call back, or ask for a work or school excuse from your e-visit provider.  You will get an email in the next two days asking about your experience.  I hope that your e-visit has been valuable and will speed your recovery.  ===View-only below this line===   ----- Message -----    From: [name]    Sent: 07/21/2018  3:51 PM EDT  To: E-Visit Mailing List Subject: E-Visit Submission: Cold Sores  E-Visit Submission: Cold Sores --------------------------------  Question: What is the location of your cold sore? Answer:   Upper lip  Question: How long have you had this episode? Answer:   Less than 24 hours  Question: Is this your first cold sore? Answer:   No  Question: How many outbreaks a year do your normally experience? Answer:   5 or greater  Question: Have you tried and failed over the counter medications? Answer:   No  Question: Have you ever been told that you are allergic to acyclovir or valacyclovir? Answer:   No  Question: Are  you able to attach a photo of your cold sore? Answer:   Yes  Question: Please list your medication allergies that you may have ? (If 'none' , please list as 'none') Answer:   Aspirin  Question: Is there anything else you would like to share with your provider? Answer:   Valtrex is what helps and i get them all the time! It started today so i want to nip it.             Thanks  A total of 5-10 minutes was spent evaluating this patients questionnaire and formulating a plan of care.

## 2018-08-06 ENCOUNTER — Other Ambulatory Visit: Payer: Self-pay | Admitting: Neurological Surgery

## 2018-08-09 ENCOUNTER — Other Ambulatory Visit: Payer: Self-pay | Admitting: Neurological Surgery

## 2018-08-09 DIAGNOSIS — M48062 Spinal stenosis, lumbar region with neurogenic claudication: Secondary | ICD-10-CM

## 2018-09-06 ENCOUNTER — Encounter: Payer: Self-pay | Admitting: Family Medicine

## 2018-09-13 ENCOUNTER — Other Ambulatory Visit: Payer: Self-pay | Admitting: Orthopedic Surgery

## 2018-09-13 DIAGNOSIS — M25512 Pain in left shoulder: Secondary | ICD-10-CM

## 2018-09-25 ENCOUNTER — Other Ambulatory Visit: Payer: Self-pay | Admitting: Orthopedic Surgery

## 2018-10-01 ENCOUNTER — Other Ambulatory Visit: Payer: Medicare Other

## 2018-10-02 ENCOUNTER — Other Ambulatory Visit: Payer: Self-pay | Admitting: Orthopedic Surgery

## 2018-10-02 DIAGNOSIS — M25512 Pain in left shoulder: Secondary | ICD-10-CM

## 2018-10-09 ENCOUNTER — Encounter (HOSPITAL_COMMUNITY): Payer: Self-pay

## 2018-10-09 ENCOUNTER — Other Ambulatory Visit: Payer: Self-pay

## 2018-10-09 ENCOUNTER — Emergency Department (HOSPITAL_COMMUNITY): Payer: Managed Care, Other (non HMO)

## 2018-10-09 ENCOUNTER — Emergency Department (HOSPITAL_COMMUNITY)
Admission: EM | Admit: 2018-10-09 | Discharge: 2018-10-09 | Disposition: A | Discharge status: Home / Self Care | Payer: Managed Care, Other (non HMO) | Attending: Emergency Medicine | Admitting: Emergency Medicine

## 2018-10-09 DIAGNOSIS — Z96611 Presence of right artificial shoulder joint: Secondary | ICD-10-CM | POA: Insufficient documentation

## 2018-10-09 DIAGNOSIS — F1210 Cannabis abuse, uncomplicated: Secondary | ICD-10-CM | POA: Insufficient documentation

## 2018-10-09 DIAGNOSIS — I10 Essential (primary) hypertension: Secondary | ICD-10-CM | POA: Insufficient documentation

## 2018-10-09 DIAGNOSIS — R55 Syncope and collapse: Secondary | ICD-10-CM | POA: Diagnosis not present

## 2018-10-09 DIAGNOSIS — S022XXA Fracture of nasal bones, initial encounter for closed fracture: Secondary | ICD-10-CM | POA: Insufficient documentation

## 2018-10-09 DIAGNOSIS — Z79899 Other long term (current) drug therapy: Secondary | ICD-10-CM | POA: Insufficient documentation

## 2018-10-09 DIAGNOSIS — Y9389 Activity, other specified: Secondary | ICD-10-CM | POA: Diagnosis not present

## 2018-10-09 DIAGNOSIS — S0181XA Laceration without foreign body of other part of head, initial encounter: Secondary | ICD-10-CM

## 2018-10-09 DIAGNOSIS — Y929 Unspecified place or not applicable: Secondary | ICD-10-CM | POA: Diagnosis not present

## 2018-10-09 DIAGNOSIS — R42 Dizziness and giddiness: Secondary | ICD-10-CM | POA: Insufficient documentation

## 2018-10-09 DIAGNOSIS — Z87891 Personal history of nicotine dependence: Secondary | ICD-10-CM | POA: Insufficient documentation

## 2018-10-09 DIAGNOSIS — S0990XA Unspecified injury of head, initial encounter: Secondary | ICD-10-CM | POA: Diagnosis present

## 2018-10-09 DIAGNOSIS — Y998 Other external cause status: Secondary | ICD-10-CM | POA: Insufficient documentation

## 2018-10-09 DIAGNOSIS — W19XXXA Unspecified fall, initial encounter: Secondary | ICD-10-CM | POA: Insufficient documentation

## 2018-10-09 LAB — BASIC METABOLIC PANEL
Anion gap: 13 (ref 5–15)
BUN: 21 mg/dL (ref 8–23)
CO2: 20 mmol/L — ABNORMAL LOW (ref 22–32)
Calcium: 8.9 mg/dL (ref 8.9–10.3)
Chloride: 107 mmol/L (ref 98–111)
Creatinine, Ser: 0.78 mg/dL (ref 0.44–1.00)
GFR calc Af Amer: >60 mL/min (ref >60)
GFR calc non Af Amer: >60 mL/min (ref >60)
Glucose, Bld: 89 mg/dL (ref 70–99)
Potassium: 3.8 mmol/L (ref 3.5–5.1)
Sodium: 140 mmol/L (ref 135–145)

## 2018-10-09 LAB — CBC
HCT: 41.6 % (ref 36.0–46.0)
Hemoglobin: 12.7 g/dL (ref 12.0–15.0)
MCH: 27.2 pg (ref 26.0–34.0)
MCHC: 30.5 g/dL (ref 30.0–36.0)
MCV: 89.1 fL (ref 80.0–100.0)
Platelets: 340 10*3/uL (ref 150–400)
RBC: 4.67 MIL/uL (ref 3.87–5.11)
RDW: 13.4 % (ref 11.5–15.5)
WBC: 7.7 10*3/uL (ref 4.0–10.5)
nRBC: 0 % (ref 0.0–0.2)

## 2018-10-09 LAB — CBG MONITORING, ED: Glucose-Capillary: 85 mg/dL (ref 70–99)

## 2018-10-09 MED ORDER — SODIUM CHLORIDE 0.9 % IV SOLN
INTRAVENOUS | Status: DC
  Administered 2018-10-09: 13:00:00 via INTRAVENOUS

## 2018-10-09 MED ORDER — SODIUM CHLORIDE 0.9 % IV BOLUS
1000.0000 mL | Freq: Once | INTRAVENOUS | Status: AC
  Administered 2018-10-09: 1000 mL via INTRAVENOUS

## 2018-10-09 MED ORDER — HYDROXYZINE HCL 10 MG PO TABS
10.0000 mg | ORAL_TABLET | Freq: Once | ORAL | Status: AC
  Administered 2018-10-09: 10 mg via ORAL
  Filled 2018-10-09: qty 1

## 2018-10-09 NOTE — ED Provider Notes (Signed)
[facility]-EMERGENCY DEPT Provider Note   CSN: [id number] Arrival date & time: 10/09/18  1120     History   Chief Complaint Chief Complaint  Patient presents with  . Loss of Consciousness  . Hypotension    HPI Kara [name] is a 63 y.o. female.     63 year old female presents with facial pain after having a syncopal event.  States that she became lightheaded and dizzy and fell forward.  Denies any chest pain or shortness of breath prior to the event.  Denies any headache prior to the event as well.  Complains of pain to the bridge of her nose where she sustained a laceration.  Denies any neck discomfort.  Denies any weakness below her neck or any neck pain.  States she feels as if her blood sugar may have gotten low.  Denies any recent illness.     Past Medical History:  Diagnosis Date  . ADD (attention deficit disorder)    on Adderal  . ADD (attention deficit disorder)   . Aggressive behavior of adult   . Anemia   . Anxiety   . Cellulitis of breast 11/2013   RIGHT BREAST  . Childhood asthma   . 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 Chronic lower back pain   . Chronic pain    went to [facility] for pain control; stopped in 2016 " (02/23/2017)  . Cold sore   . 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 Degenerative disc disease, lumbar   . Depression    takes meds daily  . Family history of malignant neoplasm of breast   . Fibromyalgia   . Fibromyalgia   . H/O degenerative disc disease   . History of kidney stones   . Hypertension   . Osteoporosis   . Osteoporosis   . Other specified rheumatoid arthritis, right shoulder (HCC) 08/01/2011  . Pneumonia 2010?  [name] Post-nasal drip    hx of  . Pre-diabetes   . PTSD (post-traumatic stress disorder)   . RA (rheumatoid arthritis) (HCC)    autoimmune arthritis  . RA (rheumatoid arthritis) (HCC)   . Spondylitis (HCC)   . Suicidal  intent     Patient Active Problem List   Diagnosis Date Noted  . Lumbar stenosis with neurogenic claudication 06/11/2018  . 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 ADD (attention deficit disorder) 09/02/2017  . 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 Cannabis use disorder, moderate, dependence (HCC) 08/31/2017  . Decreased libido 03/29/2017  . Lumbar stenosis 02/23/2017  . Spinal stenosis of lumbar region with neurogenic claudication 02/19/2017  . S/P lumbar spinal fusion 01/01/2017  . Essential hypertension 07/26/2016  . DNR (do not resuscitate) discussion   . Palliative care by specialist   . Cervical pseudoarthrosis (HCC) 06/21/2016  . Opioid use disorder, moderate, dependence (HCC) 06/14/2016  . Mitral valve prolapse 01/04/2016  . HNP (herniated nucleus pulposus), lumbar 12/10/2015  . Prediabetes 08/19/2015  . Overweight (BMI 25.0-29.9) 08/18/2015  . Chronic pain 08/18/2015  . Vitamin D deficiency 08/18/2015  . Chronic fatigue disorder 06/14/2015  . S/P cervical spinal fusion 03/15/2015  . PTSD (post-traumatic stress disorder) 03/07/2014  . Suicide threat or attempt 03/06/2014  . Aggressive behavior   . Family history of malignant neoplasm of breast   .  Rheumatoid arthritis (HCC) 12/13/2011  . Fibromyalgia 12/13/2011  . H/O cold sores 12/13/2011    Past Surgical History:  Procedure Laterality Date  . ANTERIOR CERVICAL DECOMP/DISCECTOMY FUSION N/A 03/15/2015   Procedure: Cervical five-six, Cerival six-seven, Anterior Cervical Discectomy and Fusion, Allograft and Plate;  Surgeon: Eldred MangesMark C Yates, MD;  Location: [facility] OR;  Service: Orthopedics;  Laterality: N/A;  . ANTERIOR LAT LUMBAR FUSION Left 06/11/2018   Procedure: Left Lumbar two-three Anterolateral decompression/fusion/lateral plate fixation;   Surgeon: Barnett AbuElsner, Henry, MD;  Location: [facility] OR;  Service: Neurosurgery;  Laterality: Left;  Left Lumbar two-three Anterolateral decompression/fusion/lateral plate fixation  . AUGMENTATION MAMMAPLASTY  2003  . BACK SURGERY    . BLADDER SUSPENSION  2009  . BREAST IMPLANT REMOVAL Bilateral 10/2013  . CERVICAL WOUND DEBRIDEMENT N/A 07/07/2016   Procedure: IRRIGATION AND DEBRIDEMENT POSTERIOR NECK;  Surgeon: Eldred MangesMark C Yates, MD;  Location: [facility] OR;  Service: Orthopedics;  Laterality: N/A;  . COLONOSCOPY    . COMBINED ABDOMINOPLASTY AND LIPOSUCTION  2003  . INCISION AND DRAINAGE ABSCESS Right 01/16/2014   Procedure: INCISION AND DRAINAGE AND OF RIGHT BREAST ABCESS;  Surgeon: Glenna FellowsBenjamin Hoxworth, MD;  Location: [facility];  Service: General;  Laterality: Right;  . JOINT REPLACEMENT    . LUMBAR LAMINECTOMY/DECOMPRESSION MICRODISCECTOMY N/A 12/10/2015   Procedure: Right L3-4 Hemilaminectomy, Excision of herniated nucleus pulposus;  Surgeon: Eldred MangesMark C Yates, MD;  [facility];  Service: Orthopedics;  Laterality: N/A;  . MASS EXCISION  11/03/2011   Procedure: MINOR EXCISION OF MASS;  Surgeon: Wyn Forsterobert V Sypher Jr., MD;  Location: [facility];  Service: Orthopedics;  Laterality: Left;  debride IP joint, cyst excision left index  . MAXIMUM ACCESS (MAS) TRANSFORAMINAL LUMBAR INTERBODY FUSION (TLIF) 2 LEVEL Right 02/23/2017  . POSTERIOR CERVICAL FUSION/FORAMINOTOMY N/A 06/21/2016   Procedure: POSTERIOR CERVICAL FUSION C5-C7 SPINOUS PROCESS WIRING;  Surgeon: Eldred MangesYates, Mark C, MD;  Location: [facility] OR;  Service: Orthopedics;  Laterality: N/A;  . POSTERIOR LUMBAR FUSION  07/2009; 07/03/2014   "L4-5; L5-S1"  . SHOULDER ARTHROSCOPY Right 2012  . TONSILLECTOMY  1987  . TOTAL SHOULDER ARTHROPLASTY  08/01/2011   Procedure: TOTAL SHOULDER ARTHROPLASTY;  Surgeon: Eulas PostJoshua P Landau, MD;  Location: [facility] OR;  Service: Orthopedics;  Laterality: Right;  Right total shoulder arthroplasty  . TUBAL LIGATION  1988     OB History   No  obstetric history on file.      Home Medications    Prior to Admission medications   Medication Sig Start Date End Date Taking? Authorizing Provider  alendronate (FOSAMAX) 35 MG tablet Take 1 tablet (35 mg total) by mouth every 7 (seven) days. Take with a full glass of water on an empty stomach. 04/04/18   Kuneff, Renee A, DO  ALPRAZolam (XANAX XR) 1 MG 24 hr tablet Take 1 mg by mouth 2 (two) times daily as needed for anxiety.     [provider]  amphetamine-dextroamphetamine (ADDERALL XR) 30 MG 24 hr capsule Take 30 mg by mouth 2 (two) times daily.     [provider]  Biotin 1914710000 MCG TABS Take 10,000 mcg by mouth daily.    [provider]  cholecalciferol (VITAMIN D) 1000 units tablet Take 1,000 Units by mouth daily.     [provider]  dexamethasone (DECADRON) 1 MG tablet 2 tablets twice daily for 2 days, one tablet twice daily for 2 days, one tablet daily for 2 days. 06/13/18   Barnett AbuElsner, Henry, MD  diclofenac sodium (VOLTAREN)  1 % GEL Apply 1 application topically 2 (two) times daily as needed (joint pain).  01/13/15   [provider]  folic acid (FOLVITE) 992 MCG tablet Take 800 mcg by mouth daily.    [provider]  HYDROmorphone (DILAUDID) 2 MG tablet Take 1-2 tablets (2-4 mg total) by mouth every 3 (three) hours as needed for moderate pain or severe pain. 06/13/18   Kristeen Miss, MD  lisinopril (PRINIVIL,ZESTRIL) 10 MG tablet TAKE 1 AND 1/2 TABLETS DAILY BY MOUTH Patient taking differently: Take 10 mg by mouth daily.  03/07/18   Kuneff, Renee A, DO  Lysine 1000 MG TABS Take 1,000 mg by mouth daily.    [provider]  magnesium oxide (MAG-OX) 400 MG tablet Take 400 mg by mouth daily.    [provider]  meloxicam (MOBIC) 15 MG tablet TAKE 1 TABLET (15 MG) BY MOUTH EVERY DAY Patient taking differently: Take 15 mg by mouth daily.  07/06/17   Jessy Oto, MD  methocarbamol (ROBAXIN) 500 MG tablet Take 1 tablet (500  mg total) by mouth 2 (two) times daily. Patient taking differently: Take 500 mg by mouth 2 (two) times daily as needed for muscle spasms.  03/17/18   Charlann Lange, PA-C  methocarbamol (ROBAXIN) 500 MG tablet Take 1 tablet (500 mg total) by mouth every 6 (six) hours as needed for muscle spasms. 06/13/18   Kristeen Miss, MD  OVER THE COUNTER MEDICATION Take 1 tablet by mouth daily. Bone Strength    [provider]  prazosin (MINIPRESS) 2 MG capsule Take 1 capsule (2 mg total) by mouth 2 (two) times daily. Patient taking differently: Take 2 mg by mouth 2 (two) times daily.  09/05/17   Pucilowska, Herma Ard B, MD  predniSONE (DELTASONE) 5 MG tablet Take 5 mg by mouth daily with breakfast.    [provider]  thiamine 100 MG tablet Take 100 mg by mouth 2 (two) times daily.    [provider]  traZODone (DESYREL) 100 MG tablet Take 1 tablet (100 mg total) by mouth at bedtime as needed for sleep. Patient taking differently: Take 100 mg by mouth at bedtime.  04/25/18   Pucilowska, Jolanta B, MD  tretinoin (RETIN-A) 0.1 % cream Apply 1 application topically at bedtime as needed (facial blemishes).  01/28/18   [provider]  valACYclovir (VALTREX) 1000 MG tablet Take 2 tablets (2,000 mg total) by mouth 2 (two) times daily. 07/21/18   Tereasa Coop, PA-C  venlafaxine XR (EFFEXOR-XR) 150 MG 24 hr capsule Take 1 capsule (150 mg total) by mouth daily with breakfast. Patient taking differently: Take 75 mg by mouth daily with breakfast.  04/26/18   Pucilowska, Jolanta B, MD  venlafaxine XR (EFFEXOR-XR) 75 MG 24 hr capsule Take 75 mg by mouth daily with breakfast.    [provider]  zolpidem (AMBIEN) 10 MG tablet Take 10 mg by mouth at bedtime as needed for sleep.    [provider]    Family History Family History  Problem Relation Age of Onset  . Arthritis Mother   . Heart disease Mother        ?psvt  . Breast cancer Mother 99       TAH/BSO  . Cancer  Mother   . Mental illness Mother   . COPD Father   . Hypertension Father   . Alcohol abuse Father   . Mental illness Father   . Heart disease Father   . Healthy Daughter   .  Breast cancer Maternal Aunt [id number]       deceased  . Cancer Cousin [id number]       female; unknown primary  . Colon cancer Paternal Aunt [id number]       deceased at 4967  . Stomach cancer Paternal Uncle [id number]       deceased at 6092  . Alcohol abuse Brother   . Cancer Brother   . Hodgkin's lymphoma Brother   . Mental illness Brother   . HIV Brother   . Healthy Brother   . Healthy Son     Social History Social History   Tobacco Use  . Smoking status: Former Smoker    Packs/day: 1.00    Years: 10.00    Pack years: 10.00    Types: Cigarettes    Quit date: 12/06/2005    Years since quitting: 12.8  . Smokeless tobacco: Never Used  Substance Use Topics  . Alcohol use: Yes    Comment: 02/23/2017 "might have 1-2 drinks/month"  . Drug use: Yes    Types: Marijuana    Comment: "i smoke marijuana to avoid opiates" ; last use January     Allergies   Aspirin, Bee venom, Ibuprofen, Ativan [lorazepam], Ketamine, Toradol [ketorolac tromethamine], and Tramadol hcl   Review of Systems Review of Systems  All other systems reviewed and are negative.    Physical Exam Updated Vital Signs BP 95/60   Pulse 64   Temp 97.9 F (36.6 C) (Oral)   Resp 14   Ht 1.575 m (5\' 2" )   Wt 69 kg   SpO2 97%   BMI 27.82 kg/m   Physical Exam Vitals signs and nursing note reviewed.  Constitutional:      General: She is not in acute distress.    Appearance: Normal appearance. She is well-developed. She is not toxic-appearing.  HENT:     Head: Normocephalic and atraumatic.     Nose:   Eyes:     General: Lids are normal.     Conjunctiva/sclera: Conjunctivae normal.     Pupils: Pupils are equal, round, and reactive to light.  Neck:     Musculoskeletal: Normal range of motion and neck supple.     Thyroid: No thyroid mass.     Trachea:  No tracheal deviation.  Cardiovascular:     Rate and Rhythm: Normal rate and regular rhythm.     Heart sounds: Normal heart sounds. No murmur. No gallop.   Pulmonary:     Effort: Pulmonary effort is normal. No respiratory distress.     Breath sounds: Normal breath sounds. No stridor. No decreased breath sounds, wheezing, rhonchi or rales.  Abdominal:     General: Bowel sounds are normal. There is no distension.     Palpations: Abdomen is soft.     Tenderness: There is no abdominal tenderness. There is no rebound.  Musculoskeletal: Normal range of motion.        General: No tenderness.  Skin:    General: Skin is warm and dry.     Findings: No abrasion or rash.  Neurological:     General: No focal deficit present.     Mental Status: She is alert and oriented to person, place, and time.     GCS: GCS eye subscore is 4. GCS verbal subscore is 5. GCS motor subscore is 6.     Cranial Nerves: No cranial nerve deficit.     Sensory: No sensory deficit.     Motor: No weakness or tremor.  Comments: Strength 5 of 5 in all extremities  Psychiatric:        Attention and Perception: Attention normal.        Mood and Affect: Affect is labile.        Speech: Speech normal.        Behavior: Behavior normal.      ED Treatments / Results  Labs (all labs ordered are listed, but only abnormal results are displayed) Labs Reviewed  CBC  BASIC METABOLIC PANEL  CBG MONITORING, ED    EKG None  Radiology No results found.  Procedures Procedures (including critical care time)  Medications Ordered in ED Medications - No data to display   Initial Impression / Assessment and Plan / ED Course  I have reviewed the triage vital signs and the nursing notes.  Pertinent labs & imaging results that were available during my care of the patient were reviewed by me and considered in my medical decision making (see chart for details).       LACERATION REPAIR Performed by: Toy Baker  Authorized by: Toy Baker Consent: Verbal consent obtained. Risks and benefits: risks, benefits and alternatives were discussed Consent given by: patient Patient identity confirmed: provided demographic data Prepped and Draped in normal sterile fashion Wound explored  Laceration Location: Bridge of nose  Laceration Length: 0.5 cm  No Foreign Bodies seen or palpated  Anesthesia: local infiltration   Irrigation method: syringe Amount of cleaning: standard  Skin closure: Dermabond    Patient tolerance: Patient tolerated the procedure well with no immediate complications.   Patient likely vagal episode.  Patient's imaging did show evidence of a small nasal bone fracture.  Nothing appears to be surgical.  She had a superficial laceration the bridge of her nose that was repaired with Dermabond. Patient stable for discharge  Final Clinical Impressions(s) / ED Diagnoses   Final diagnoses:  None    ED Discharge Orders    None       Lorre Nick, MD 10/09/18 1533

## 2018-10-09 NOTE — ED Notes (Signed)
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 Pt also stated that her daughter is living in the streets.

## 2018-10-09 NOTE — ED Notes (Signed)
RN checked on patient to provide patient with additional warm blankets. Patient was yelling at RN, Health and safety inspector. Patient called RN "Dumbass" and then requested to use restroom. Patient then continued to yell at RN and gave RN the middle finger and then called RN an "asshole".

## 2018-10-09 NOTE — ED Triage Notes (Signed)
[place] [facility] [place]. Patient chief complaint is syncopal episode. Patient fell on sidewalk face first. Laceration to nasal Bridge. EMS arrived and patient began to come to, patient began to act erratic crying, yelling and stating daughter is going to be homeless once husband arrived on scene, AOx4 and ambulatory.

## 2018-10-09 NOTE — ED Notes (Signed)
Patient does not wish to have any help as far as psychological or social work help regarding previous comments in psych assessment.

## 2018-10-09 NOTE — ED Notes (Signed)
ED Provider at bedside. ED Provider informed that patient is wanting to leave.

## 2018-10-10 ENCOUNTER — Emergency Department (HOSPITAL_COMMUNITY): Payer: Managed Care, Other (non HMO)

## 2018-10-10 ENCOUNTER — Other Ambulatory Visit: Payer: Self-pay

## 2018-10-10 ENCOUNTER — Emergency Department (HOSPITAL_COMMUNITY)
Admission: EM | Admit: 2018-10-10 | Discharge: 2018-10-10 | Disposition: A | Discharge status: Home / Self Care | Payer: Managed Care, Other (non HMO) | Attending: Emergency Medicine | Admitting: Emergency Medicine

## 2018-10-10 DIAGNOSIS — Y92009 Unspecified place in unspecified non-institutional (private) residence as the place of occurrence of the external cause: Secondary | ICD-10-CM | POA: Diagnosis not present

## 2018-10-10 DIAGNOSIS — Y939 Activity, unspecified: Secondary | ICD-10-CM | POA: Insufficient documentation

## 2018-10-10 DIAGNOSIS — M069 Rheumatoid arthritis, unspecified: Secondary | ICD-10-CM | POA: Insufficient documentation

## 2018-10-10 DIAGNOSIS — R413 Other amnesia: Secondary | ICD-10-CM | POA: Diagnosis present

## 2018-10-10 DIAGNOSIS — Y999 Unspecified external cause status: Secondary | ICD-10-CM | POA: Insufficient documentation

## 2018-10-10 DIAGNOSIS — Z87891 Personal history of nicotine dependence: Secondary | ICD-10-CM | POA: Insufficient documentation

## 2018-10-10 DIAGNOSIS — W010XXA Fall on same level from slipping, tripping and stumbling without subsequent striking against object, initial encounter: Secondary | ICD-10-CM | POA: Insufficient documentation

## 2018-10-10 DIAGNOSIS — Z79899 Other long term (current) drug therapy: Secondary | ICD-10-CM | POA: Insufficient documentation

## 2018-10-10 DIAGNOSIS — S060X0A Concussion without loss of consciousness, initial encounter: Secondary | ICD-10-CM | POA: Diagnosis not present

## 2018-10-10 NOTE — ED Notes (Signed)
Pt tearful, anxious, informs this nurse "I'm having nightmares" This nurse notified PA, PA at bedside

## 2018-10-10 NOTE — ED Provider Notes (Signed)
[facility] DEPT Provider Note   CSN: [id number] Arrival date & time: 10/10/18  0708    History   Chief Complaint Chief Complaint  Patient presents with   Fall   Memory Loss    HPI Makenlee Mckeag is a 63 y.o. female.     Christalynn Boise is a 63 y.o. female with a history of fibromyalgia, rheumatoid arthritis, hypertension, chronic pain, who presents to the emergency department for evaluation of confusion and memory issues.  Patient was seen and evaluated in the emergency department yesterday after she had a syncopal episode falling and striking her face.  She was diagnosed with a nasal fracture with associated laceration.  She had a negative head CT at this time and the rest of her work-up was overall reassuring.  Thought to likely be vagal episode patient also had expressed concern that her blood sugar may have dropped.  She returns to the emergency department today reporting since head injury she has had some confusion and difficulty remembering things like where her dry cleaner is located and she reports that she went to the closet to get one thing but came out with another.  She reports mild headache.  She denies any vision changes, nausea or vomiting.  No numbness tingling or weakness in the face or extremities.  She reports that her coordination seems to be a bit off but she has been able to ambulate without difficulty.  She does report that while walking in her home her knee seem to give out and she fell again but caught herself with her hands did not hit her head or injure anything.  Patient reports her voice is sore because she has been yelling constantly at her husband, they are currently having some issues at home and she is planning to leave.  Patient easily becomes tearful and distressed when talking about this.  While patient seems upset given current issues with her husband she denies any thoughts of harming herself or others.  Has had recent previous  psychiatric evaluations, and patient was felt safe to return home.  She reports she wass supposed to begoing on a trip to [place] on Friday, and seems to be upset about this as well.     Past Medical History:  Diagnosis Date   ADD (attention deficit disorder)    on Adderal   ADD (attention deficit disorder)    Aggressive behavior of adult    Anemia    Anxiety    Cellulitis of breast 11/2013   RIGHT BREAST   Childhood asthma    Chronic fatigue and immune dysfunction syndrome (HCC)    Chronic lower back pain    Chronic pain    went to [facility] for pain control; stopped in 2016 " (02/23/2017)   Cold sore    Confusion caused by a drug ([place])    methotrexate and autoimmune disease    Degenerative disc disease, lumbar    Depression    takes meds daily   Family history of malignant neoplasm of breast    Fibromyalgia    Fibromyalgia    H/O degenerative disc disease    History of kidney stones    Hypertension    Osteoporosis    Osteoporosis    Other specified rheumatoid arthritis, right shoulder ([place]) 08/01/2011   Pneumonia 2010?   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/01/2017   Sedative, hypnotic or anxiolytic use disorder, severe, dependence (HCC) 08/31/2017   Cannabis use disorder, moderate, dependence (HCC) 08/31/2017   Decreased libido 03/29/2017   Lumbar stenosis 02/23/2017   Spinal stenosis of lumbar region with neurogenic  claudication 02/19/2017   S/P lumbar spinal fusion 01/01/2017   Essential hypertension 07/26/2016   DNR (do not resuscitate) discussion    Palliative care by specialist    Cervical pseudoarthrosis (HCC) 06/21/2016   Opioid use disorder, moderate, dependence (HCC) 06/14/2016   Mitral valve prolapse 01/04/2016   HNP (herniated nucleus pulposus), lumbar 12/10/2015   Prediabetes 08/19/2015   Overweight (BMI 25.0-29.9) 08/18/2015   Chronic pain 08/18/2015   Vitamin D deficiency 08/18/2015   Chronic fatigue disorder 06/14/2015   S/P cervical spinal fusion 03/15/2015   PTSD (post-traumatic stress disorder) 03/07/2014   Suicide threat or attempt 03/06/2014   Aggressive behavior    Family history of malignant neoplasm of breast    Rheumatoid arthritis (HCC) 12/13/2011   Fibromyalgia 12/13/2011   H/O cold sores 12/13/2011    Past Surgical History:  Procedure Laterality Date   ANTERIOR CERVICAL DECOMP/DISCECTOMY FUSION N/A 03/15/2015   Procedure: Cervical five-six, Cerival six-seven, Anterior Cervical Discectomy and Fusion, Allograft and Plate;  Surgeon: Eldred Manges, MD;  Location: [facility] OR;  Service: Orthopedics;  Laterality: N/A;   ANTERIOR LAT LUMBAR FUSION Left 06/11/2018   Procedure: Left Lumbar two-three Anterolateral decompression/fusion/lateral plate fixation;  Surgeon: Barnett Abu, MD;  Location: [facility] OR;  Service: Neurosurgery;  Laterality: Left;  Left Lumbar two-three Anterolateral decompression/fusion/lateral plate fixation   AUGMENTATION MAMMAPLASTY  2003   BACK SURGERY     BLADDER SUSPENSION  2009   BREAST IMPLANT REMOVAL Bilateral 10/2013   CERVICAL WOUND DEBRIDEMENT N/A 07/07/2016   Procedure: IRRIGATION AND DEBRIDEMENT POSTERIOR NECK;  Surgeon: Eldred Manges, MD;  Location: [facility] OR;  Service: Orthopedics;  Laterality: N/A;   COLONOSCOPY     COMBINED ABDOMINOPLASTY AND LIPOSUCTION  2003   INCISION AND DRAINAGE ABSCESS Right 01/16/2014   Procedure:  INCISION AND DRAINAGE AND OF RIGHT BREAST ABCESS;  Surgeon: Glenna Fellows, MD;  Location: [facility]S;  Service: General;  Laterality: Right;   JOINT REPLACEMENT     LUMBAR LAMINECTOMY/DECOMPRESSION MICRODISCECTOMY N/A 12/10/2015   Procedure: Right L3-4 Hemilaminectomy, Excision of herniated nucleus pulposus;  Surgeon: Eldred Manges, MD;  Location: [facility] OR;  Service: Orthopedics;  Laterality: N/A;   MASS EXCISION  11/03/2011   Procedure: MINOR EXCISION OF MASS;  Surgeon: Wyn Forster., MD;  Location: [facility];  Service: Orthopedics;  Laterality: Left;  debride IP joint, cyst excision left index   MAXIMUM ACCESS (MAS) TRANSFORAMINAL LUMBAR INTERBODY FUSION (TLIF) 2 LEVEL Right 02/23/2017   POSTERIOR CERVICAL FUSION/FORAMINOTOMY N/A 06/21/2016   Procedure: POSTERIOR CERVICAL FUSION C5-C7 SPINOUS PROCESS WIRING;  Surgeon: Eldred Manges, MD;  Location: [facility] OR;  Service: Orthopedics;  Laterality: N/A;   POSTERIOR LUMBAR FUSION  07/2009; 07/03/2014   "L4-5; L5-S1"   SHOULDER ARTHROSCOPY Right 2012   TONSILLECTOMY  1987   TOTAL SHOULDER ARTHROPLASTY  08/01/2011  Procedure: TOTAL SHOULDER ARTHROPLASTY;  Surgeon: Eulas PostJoshua P Landau, MD;  Location: [facility] OR;  Service: Orthopedics;  Laterality: Right;  Right total shoulder arthroplasty   TUBAL LIGATION  1988     OB History   No obstetric history on file.      Home Medications    Prior to Admission medications   Medication Sig Start Date End Date Taking? Authorizing Provider  alendronate (FOSAMAX) 35 MG tablet Take 1 tablet (35 mg total) by mouth every 7 (seven) days. Take with a full glass of water on an empty stomach. 04/04/18   Kuneff, Renee A, DO  ALPRAZolam (XANAX XR) 1 MG 24 hr tablet Take 1 mg by mouth 2 (two) times daily as needed for anxiety.     [provider]  amphetamine-dextroamphetamine (ADDERALL XR) 30 MG 24 hr capsule Take 30 mg by mouth 2 (two) times daily.     [provider]  Biotin 1610910000 MCG TABS  Take 10,000 mcg by mouth daily.    [provider]  cholecalciferol (VITAMIN D) 1000 units tablet Take 1,000 Units by mouth daily.     [provider]  dexamethasone (DECADRON) 1 MG tablet 2 tablets twice daily for 2 days, one tablet twice daily for 2 days, one tablet daily for 2 days. 06/13/18   Barnett AbuElsner, Henry, MD  diclofenac sodium (VOLTAREN) 1 % GEL Apply 1 application topically 2 (two) times daily as needed (joint pain).  01/13/15   [provider]  folic acid (FOLVITE) 800 MCG tablet Take 800 mcg by mouth daily.    [provider]  HYDROmorphone (DILAUDID) 2 MG tablet Take 1-2 tablets (2-4 mg total) by mouth every 3 (three) hours as needed for moderate pain or severe pain. 06/13/18   Barnett AbuElsner, Henry, MD  lisinopril (PRINIVIL,ZESTRIL) 10 MG tablet TAKE 1 AND 1/2 TABLETS DAILY BY MOUTH Patient taking differently: Take 10 mg by mouth daily.  03/07/18   Kuneff, Renee A, DO  Lysine 1000 MG TABS Take 1,000 mg by mouth daily.    [provider]  magnesium oxide (MAG-OX) 400 MG tablet Take 400 mg by mouth daily.    [provider]  meloxicam (MOBIC) 15 MG tablet TAKE 1 TABLET (15 MG) BY MOUTH EVERY DAY Patient taking differently: Take 15 mg by mouth daily.  07/06/17   Kerrin ChampagneNitka, James E, MD  methocarbamol (ROBAXIN) 500 MG tablet Take 1 tablet (500 mg total) by mouth 2 (two) times daily. Patient taking differently: Take 500 mg by mouth 2 (two) times daily as needed for muscle spasms.  03/17/18   Elpidio AnisUpstill, Shari, PA-C  methocarbamol (ROBAXIN) 500 MG tablet Take 1 tablet (500 mg total) by mouth every 6 (six) hours as needed for muscle spasms. 06/13/18   Barnett AbuElsner, Henry, MD  OVER THE COUNTER MEDICATION Take 1 tablet by mouth daily. Bone Strength    [provider]  prazosin (MINIPRESS) 2 MG capsule Take 1 capsule (2 mg total) by mouth 2 (two) times daily. Patient taking differently: Take 2 mg by mouth 2 (two) times daily.  09/05/17   Pucilowska, Braulio ConteJolanta B, MD   predniSONE (DELTASONE) 5 MG tablet Take 5 mg by mouth daily with breakfast.    [provider]  thiamine 100 MG tablet Take 100 mg by mouth 2 (two) times daily.    [provider]  traZODone (DESYREL) 100 MG tablet Take 1 tablet (100 mg total) by mouth at bedtime as needed for sleep. Patient taking differently: Take 100 mg  by mouth at bedtime.  04/25/18   Pucilowska, Jolanta B, MD  tretinoin (RETIN-A) 0.1 % cream Apply 1 application topically at bedtime as needed (facial blemishes).  01/28/18   [provider]  valACYclovir (VALTREX) 1000 MG tablet Take 2 tablets (2,000 mg total) by mouth 2 (two) times daily. 07/21/18   Ofilia Neaslark, Michael L, PA-C  venlafaxine XR (EFFEXOR-XR) 150 MG 24 hr capsule Take 1 capsule (150 mg total) by mouth daily with breakfast. Patient taking differently: Take 75 mg by mouth daily with breakfast.  04/26/18   Pucilowska, Jolanta B, MD  venlafaxine XR (EFFEXOR-XR) 75 MG 24 hr capsule Take 75 mg by mouth daily with breakfast.    [provider]  zolpidem (AMBIEN) 10 MG tablet Take 10 mg by mouth at bedtime as needed for sleep.    [provider]    Family History Family History  Problem Relation Age of Onset   Arthritis Mother    Heart disease Mother        ?psvt   Breast cancer Mother [id number]       TAH/BSO   Cancer Mother    Mental illness Mother    COPD Father    Hypertension Father    Alcohol abuse Father    Mental illness Father    Heart disease Father    Healthy Daughter    Breast cancer Maternal Aunt [id number]       deceased   Cancer Cousin [id number]       female; unknown primary   Colon cancer Paternal Aunt [id number]       deceased at [id number]   Stomach cancer Paternal Uncle [id number]       deceased at 2992   Alcohol abuse Brother    Cancer Brother    Hodgkin's lymphoma Brother    Mental illness Brother    HIV Brother    Healthy Brother    Healthy Son     Social History Social History   Tobacco Use   Smoking status:  Former Smoker    Packs/day: 1.00    Years: 10.00    Pack years: 10.00    Types: Cigarettes    Quit date: 12/06/2005    Years since quitting: 12.8   Smokeless tobacco: Never Used  Substance Use Topics   Alcohol use: Yes    Comment: 02/23/2017 "might have 1-2 drinks/month"   Drug use: Yes    Types: Marijuana    Comment: "i smoke marijuana to avoid opiates" ; last use January     Allergies   Aspirin, Bee venom, Ibuprofen, Ativan [lorazepam], Ketamine, Toradol [ketorolac tromethamine], and Tramadol hcl   Review of Systems Review of Systems  Constitutional: Negative for chills and fever.  HENT: Negative.   Eyes: Negative for pain and visual disturbance.  Respiratory: Negative for cough and shortness of breath.   Cardiovascular: Negative for chest pain.  Gastrointestinal: Negative for abdominal pain, nausea and vomiting.  Musculoskeletal: Positive for myalgias. Negative for arthralgias.  Skin: Negative for color change and rash.  Neurological: Positive for headaches. Negative for dizziness, tremors, seizures, syncope, facial asymmetry, speech difficulty, weakness, light-headedness and numbness.  Psychiatric/Behavioral: Positive for confusion and decreased concentration.  All other systems reviewed and are negative.    Physical Exam Updated Vital Signs BP 113/64 (BP Location: Right Arm)    Pulse 82    Temp 98.1 F (36.7 C) (Oral)    Resp 17    Ht 5\' 4"  (1.626 m)    Wt 64.9  kg    SpO2 97%    BMI 24.55 kg/m   Physical Exam Vitals signs and nursing note reviewed.  Constitutional:      General: She is not in acute distress.    Appearance: Normal appearance. She is well-developed. She is not diaphoretic.     Comments: Patient alert with normal appearance, intermittently tearful  HENT:     Head: Normocephalic.     Comments: Slight erythema and swelling to the nose, diagnosed with nasal fracture yesterday, Dermabond in place over small nasal bridge laceration    Mouth/Throat:       Mouth: Mucous membranes are moist.     Pharynx: Oropharynx is clear.     Comments: Posterior oropharynx is clear, voice is hoarse, which patient reports is due to frequent yelling at home Eyes:     General:        Right eye: No discharge.        Left eye: No discharge.     Pupils: Pupils are equal, round, and reactive to light.  Neck:     Musculoskeletal: Neck supple.  Cardiovascular:     Rate and Rhythm: Normal rate and regular rhythm.     Heart sounds: Normal heart sounds.  Pulmonary:     Effort: Pulmonary effort is normal. No respiratory distress.     Breath sounds: Normal breath sounds. No wheezing or rales.  Abdominal:     General: Bowel sounds are normal. There is no distension.     Palpations: Abdomen is soft. There is no mass.     Tenderness: There is no abdominal tenderness. There is no guarding.  Musculoskeletal:        General: No deformity.  Skin:    General: Skin is warm and dry.     Capillary Refill: Capillary refill takes less than 2 seconds.  Neurological:     Mental Status: She is alert.     Coordination: Coordination normal.     Comments: Speech is clear, able to follow commands CN III-XII intact Normal strength in upper and lower extremities bilaterally including dorsiflexion and plantar flexion, strong and equal grip strength Sensation normal to light and sharp touch Moves extremities without ataxia, coordination intact Normal finger-to-nose testing.  No pronator drift.  Psychiatric:        Attention and Perception: She does not perceive auditory or visual hallucinations.        Mood and Affect: Affect is labile and tearful.        Behavior: Behavior is cooperative.        Thought Content: Thought content does not include homicidal or suicidal ideation.      ED Treatments / Results  Labs (all labs ordered are listed, but only abnormal results are displayed) Labs Reviewed - No data to display  EKG None  Radiology Ct Head Wo  Contrast  Result Date: 10/10/2018 CLINICAL DATA:  Fall October 09, 2018.  Memory loss. EXAM: CT HEAD WITHOUT CONTRAST TECHNIQUE: Contiguous axial images were obtained from the base of the skull through the vertex without intravenous contrast. COMPARISON:  October 09, 2018 FINDINGS: Brain: No evidence of acute infarction, hemorrhage, hydrocephalus, extra-axial collection or mass lesion/mass effect. Vascular: No hyperdense vessel or unexpected calcification. Skull: Normal. Negative for fracture or focal lesion. Sinuses/Orbits: No acute finding. Other: None. IMPRESSION: No acute intracranial abnormalities are identified. Electronically Signed   By: Gerome Sam III M.D   On: 10/10/2018 08:41   Procedures Procedures (including critical care time)  Medications  Ordered in ED Medications - No data to display   Initial Impression / Assessment and Plan / ED Course  I have reviewed the triage vital signs and the nursing notes.  Pertinent labs & imaging results that were available during my care of the patient were reviewed by me and considered in my medical decision making (see chart for details).  Patient presents with some confusion and short-term memory problems after head injury.  She was seen yesterday and initially had reassuring CT, was diagnosed with a nasal fracture.  On arrival today she is alert and oriented with normal vitals.  She has no focal neurologic deficits on exam.  Patient frequently yelling and seems somewhat agitated about home issues with her husband but denies any HI SI.  She is alert and oriented x4 with organized thinking.  Does appear somewhat hypomanic but does not seem to be a danger to herself or others.  I suspect that her memory symptoms and confusion may be due to concussion but given recent head injury will repeat head CT to look for any delayed bleeding.  I have discussed this plan with the patient and she expresses understanding and agreement with plan.  Patient requires  frequent redirection from nursing.  Now that she has been provided reassurance she is anxious to go home.  Patient's CT scan has returned with no evidence of delayed bleeding or other acute intracranial abnormalities.  I discussed with patient that this is likely due to concussion.  I have given her referral to concussion clinic for follow-up.  Discussed rest over the next few days, typical concussion symptoms and strict return precautions.  Discussed avoiding second injury to the head.  Patient expresses understanding and agreement with plan.  She ambulated from the department with steady gait without assistance.  Discharged home in good condition.  Patient discussed with Dr. Jacqulyn Bath, who saw patient as well and agrees with plan.  Final Clinical Impressions(s) / ED Diagnoses   Final diagnoses:  Concussion without loss of consciousness, initial encounter    ED Discharge Orders    None       Dartha Lodge, [place] 10/10/18 [id number]    Maia Plan, MD 10/10/18 1021

## 2018-10-10 NOTE — ED Triage Notes (Signed)
Pt to ED from home via POV. Pt reports she fell yesterday, came to the ED- reports she was dx with broken nose and went home. Since pt has been at home she reports problems with memory. On assessment pt A&O x 4, pt anxious, tearful at times.

## 2018-10-10 NOTE — Discharge Instructions (Signed)
You were examined today for a head injury and possible concussion.  Your head CT showed no evidence of bleeding or other injury today, you have a concussion.  Sometimes serious problems can develop after a head injury. Please return to the emergency department if you experience any of the following symptoms: Repeated vomiting Headache that gets worse and does not go away Loss of consciousness or inability to stay awake at times when you normally would be able to Getting more confused, restless or agitated Convulsions or seizures Difficulty walking or feeling off balance Weakness or numbness Vision changes A concussion is a very mild traumatic brain injury caused by a bump, jolt or blow to the head, most people recover quickly and fully. You can experience a wide variety of symptoms including:   - Confusion      - Difficulty concentrating       - Trouble remembering new info  - Headache      - Dizziness        - Fuzzy or blurry vision  - Fatigue      - Balance problems      - Light sensitivity  - Mood swings     - Changes in sleep or difficulty sleeping   To help these symptoms improve make sure you are getting plenty of rest, avoid screen time, loud music and strenuous mental activities. Avoid any strenuous physical activities, once your symptoms have resolved a slow and gradual return to activity is recommended. It is very important that you avoid situations in which you might sustain a second head injury as this can be very dangerous and life threatening. You cannot be medically cleared to return to normal activities until you have followed up with your primary doctor or a concussion specialist for reevaluation.

## 2018-10-10 NOTE — ED Notes (Signed)
Patient transported to CT 

## 2018-10-11 ENCOUNTER — Other Ambulatory Visit: Payer: Medicare Other

## 2018-10-23 DIAGNOSIS — M19042 Primary osteoarthritis, left hand: Secondary | ICD-10-CM | POA: Insufficient documentation

## 2018-10-30 ENCOUNTER — Other Ambulatory Visit: Payer: Self-pay | Admitting: Orthopedic Surgery

## 2018-10-30 ENCOUNTER — Other Ambulatory Visit: Payer: Self-pay

## 2018-10-30 ENCOUNTER — Encounter (HOSPITAL_COMMUNITY): Payer: Self-pay | Admitting: Emergency Medicine

## 2018-10-30 ENCOUNTER — Emergency Department (HOSPITAL_COMMUNITY)
Admission: EM | Admit: 2018-10-30 | Discharge: 2018-10-30 | Discharge status: Against Medical Advice | Payer: Managed Care, Other (non HMO) | Attending: Emergency Medicine | Admitting: Emergency Medicine

## 2018-10-30 DIAGNOSIS — Z96611 Presence of right artificial shoulder joint: Secondary | ICD-10-CM | POA: Diagnosis not present

## 2018-10-30 DIAGNOSIS — I10 Essential (primary) hypertension: Secondary | ICD-10-CM | POA: Diagnosis not present

## 2018-10-30 DIAGNOSIS — R451 Restlessness and agitation: Secondary | ICD-10-CM

## 2018-10-30 DIAGNOSIS — Z87891 Personal history of nicotine dependence: Secondary | ICD-10-CM | POA: Insufficient documentation

## 2018-10-30 DIAGNOSIS — F4310 Post-traumatic stress disorder, unspecified: Secondary | ICD-10-CM

## 2018-10-30 DIAGNOSIS — J45909 Unspecified asthma, uncomplicated: Secondary | ICD-10-CM | POA: Insufficient documentation

## 2018-10-30 DIAGNOSIS — Z79899 Other long term (current) drug therapy: Secondary | ICD-10-CM | POA: Diagnosis not present

## 2018-10-30 NOTE — ED Triage Notes (Signed)
Pt is hysterical on admission to the TCU, yelling and screaming that her daughter is in the streets. When encouraged verbally to calm and down she escalated more. Continues to yell and cry.  Difficult to get history related to hysteria.

## 2018-10-30 NOTE — ED Provider Notes (Signed)
[facility]-EMERGENCY DEPT Provider Note   CSN: [id number] Arrival date & time: 10/30/18  1024     History   Chief Complaint Chief Complaint  Patient presents with  . Medical Clearance    HPI Kara [name] is a 63 y.o. female.     Patient brought herself to the emergency department.  Patient denies any suicidal or homicidal ideation.  Patient stating that she wants help with her posttraumatic stress disorder.  Patient is agitated.  But functional.  No apparent hallucinations.  Patient has a past medical history of attention deficit disorder.  History of fibromyalgia.  Does have past records showing psychiatric evaluation for posttraumatic stress disorder.  Patient recently in June was an inpatient facility but signed out AMA.  All of her records have not shown any suicidal intent or homicidal intent.  Her problem list does list suicide intent but patient adamantly denies that here today.     Past Medical History:  Diagnosis Date  . ADD (attention deficit disorder)    on Adderal  . ADD (attention deficit disorder)   . Aggressive behavior of adult   . Anemia   . Anxiety   . Cellulitis of breast 11/2013   RIGHT BREAST  . Childhood asthma   . 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 Chronic lower back pain   . Chronic pain    went to [facility] for pain control; stopped in 2016 " (02/23/2017)  . Cold sore   . 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 Degenerative disc disease, lumbar   . Depression    takes meds daily  . Family history of malignant neoplasm of breast   . Fibromyalgia   . Fibromyalgia   . H/O degenerative disc disease   . History of kidney stones   . Hypertension   . Osteoporosis   . Osteoporosis   . Other specified rheumatoid arthritis, right shoulder (HCC) 08/01/2011  . Pneumonia 2010?  [name]. Post-nasal drip    hx of  . Pre-diabetes   . PTSD (post-traumatic stress  disorder)   . RA (rheumatoid arthritis) (HCC)    autoimmune arthritis  . RA (rheumatoid arthritis) (HCC)   . Spondylitis (HCC)   . Suicidal intent     Patient Active Problem List   Diagnosis Date Noted  . Lumbar stenosis with neurogenic claudication 06/11/2018  . 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 ADD (attention deficit disorder) 09/02/2017  . 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 Cannabis use disorder, moderate, dependence (HCC) 08/31/2017  . Decreased libido 03/29/2017  . Lumbar stenosis 02/23/2017  . Spinal stenosis of lumbar region with neurogenic claudication 02/19/2017  . S/P lumbar spinal fusion 01/01/2017  . Essential hypertension 07/26/2016  . DNR (do not resuscitate) discussion   . Palliative care by specialist   . Cervical pseudoarthrosis (HCC) 06/21/2016  . Opioid use disorder, moderate, dependence (HCC) 06/14/2016  . Mitral valve prolapse 01/04/2016  . HNP (herniated nucleus pulposus), lumbar 12/10/2015  . Prediabetes 08/19/2015  . Overweight (BMI 25.0-29.9) 08/18/2015  . Chronic pain 08/18/2015  . Vitamin D deficiency 08/18/2015  . Chronic fatigue disorder 06/14/2015  . S/P cervical spinal fusion 03/15/2015  . PTSD (post-traumatic stress disorder) 03/07/2014  . Suicide threat or attempt 03/06/2014  . Aggressive behavior   .  Family history of malignant neoplasm of breast   . Rheumatoid arthritis (HCC) 12/13/2011  . Fibromyalgia 12/13/2011  . H/O cold sores 12/13/2011    Past Surgical History:  Procedure Laterality Date  . ANTERIOR CERVICAL DECOMP/DISCECTOMY FUSION N/A 03/15/2015   Procedure: Cervical five-six, Cerival six-seven, Anterior Cervical Discectomy and Fusion, Allograft and Plate;  Surgeon: Eldred MangesMark C Yates, MD;  Location: [facility] OR;  Service: Orthopedics;  Laterality: N/A;   . ANTERIOR LAT LUMBAR FUSION Left 06/11/2018   Procedure: Left Lumbar two-three Anterolateral decompression/fusion/lateral plate fixation;  Surgeon: Barnett AbuElsner, Henry, MD;  Location: [facility] OR;  Service: Neurosurgery;  Laterality: Left;  Left Lumbar two-three Anterolateral decompression/fusion/lateral plate fixation  . AUGMENTATION MAMMAPLASTY  2003  . BACK SURGERY    . BLADDER SUSPENSION  2009  . BREAST IMPLANT REMOVAL Bilateral 10/2013  . CERVICAL WOUND DEBRIDEMENT N/A 07/07/2016   Procedure: IRRIGATION AND DEBRIDEMENT POSTERIOR NECK;  Surgeon: Eldred MangesMark C Yates, MD;  Location: [facility] OR;  Service: Orthopedics;  Laterality: N/A;  . COLONOSCOPY    . COMBINED ABDOMINOPLASTY AND LIPOSUCTION  2003  . INCISION AND DRAINAGE ABSCESS Right 01/16/2014   Procedure: INCISION AND DRAINAGE AND OF RIGHT BREAST ABCESS;  Surgeon: Glenna FellowsBenjamin Hoxworth, MD;  Location: [facility] ORS;  Service: General;  Laterality: Right;  . JOINT REPLACEMENT    . LUMBAR LAMINECTOMY/DECOMPRESSION MICRODISCECTOMY N/A 12/10/2015   Procedure: Right L3-4 Hemilaminectomy, Excision of herniated nucleus pulposus;  Surgeon: Eldred MangesMark C Yates, MD;  [facility];  Service: Orthopedics;  Laterality: N/A;  . MASS EXCISION  11/03/2011   Procedure: MINOR EXCISION OF MASS;  Surgeon: Wyn Forsterobert V Sypher Jr., MD;  Location: [facility];  Service: Orthopedics;  Laterality: Left;  debride IP joint, cyst excision left index  . MAXIMUM ACCESS (MAS) TRANSFORAMINAL LUMBAR INTERBODY FUSION (TLIF) 2 LEVEL Right 02/23/2017  . POSTERIOR CERVICAL FUSION/FORAMINOTOMY N/A 06/21/2016   Procedure: POSTERIOR CERVICAL FUSION C5-C7 SPINOUS PROCESS WIRING;  Surgeon: Eldred MangesYates, Mark C, MD;  Location: [facility] OR;  Service: Orthopedics;  Laterality: N/A;  . POSTERIOR LUMBAR FUSION  07/2009; 07/03/2014   "L4-5; L5-S1"  . SHOULDER ARTHROSCOPY Right 2012  . TONSILLECTOMY  1987  . TOTAL SHOULDER ARTHROPLASTY  08/01/2011   Procedure: TOTAL SHOULDER ARTHROPLASTY;  Surgeon: Eulas PostJoshua P Landau, MD;  Location:  [facility] OR;  Service: Orthopedics;  Laterality: Right;  Right total shoulder arthroplasty  . TUBAL LIGATION  1988     OB History   No obstetric history on file.      Home Medications    Prior to Admission medications   Medication Sig Start Date End Date Taking? Authorizing Provider  alendronate (FOSAMAX) 35 MG tablet Take 1 tablet (35 mg total) by mouth every 7 (seven) days. Take with a full glass of water on an empty stomach. 04/04/18   Kuneff, Renee A, DO  ALPRAZolam (XANAX XR) 1 MG 24 hr tablet Take 1 mg by mouth 2 (two) times daily as needed for anxiety.     [provider]  amphetamine-dextroamphetamine (ADDERALL XR) 30 MG 24 hr capsule Take 30 mg by mouth 2 (two) times daily.     [provider]  Biotin 7253610000 MCG TABS Take 10,000 mcg by mouth daily.    [provider]  cholecalciferol (VITAMIN D) 1000 units tablet Take 1,000 Units by mouth daily.     [provider]  dexamethasone (DECADRON) 1 MG tablet 2 tablets twice daily for 2 days, one tablet twice daily for 2 days, one tablet daily for 2 days.  06/13/18   Kristeen Miss, MD  diclofenac sodium (VOLTAREN) 1 % GEL Apply 1 application topically 2 (two) times daily as needed (joint pain).  01/13/15   [provider]  folic acid (FOLVITE) 063 MCG tablet Take 800 mcg by mouth daily.    [provider]  HYDROmorphone (DILAUDID) 2 MG tablet Take 1-2 tablets (2-4 mg total) by mouth every 3 (three) hours as needed for moderate pain or severe pain. 06/13/18   Kristeen Miss, MD  lisinopril (PRINIVIL,ZESTRIL) 10 MG tablet TAKE 1 AND 1/2 TABLETS DAILY BY MOUTH Patient taking differently: Take 10 mg by mouth daily.  03/07/18   Kuneff, Renee A, DO  Lysine 1000 MG TABS Take 1,000 mg by mouth daily.    [provider]  magnesium oxide (MAG-OX) 400 MG tablet Take 400 mg by mouth daily.    [provider]  meloxicam (MOBIC) 15 MG tablet TAKE 1 TABLET (15 MG) BY MOUTH EVERY DAY Patient  taking differently: Take 15 mg by mouth daily.  07/06/17   Jessy Oto, MD  methocarbamol (ROBAXIN) 500 MG tablet Take 1 tablet (500 mg total) by mouth 2 (two) times daily. Patient taking differently: Take 500 mg by mouth 2 (two) times daily as needed for muscle spasms.  03/17/18   Charlann Lange, PA-C  methocarbamol (ROBAXIN) 500 MG tablet Take 1 tablet (500 mg total) by mouth every 6 (six) hours as needed for muscle spasms. 06/13/18   Kristeen Miss, MD  OVER THE COUNTER MEDICATION Take 1 tablet by mouth daily. Bone Strength    [provider]  prazosin (MINIPRESS) 2 MG capsule Take 1 capsule (2 mg total) by mouth 2 (two) times daily. Patient taking differently: Take 2 mg by mouth 2 (two) times daily.  09/05/17   Pucilowska, Herma Ard B, MD  predniSONE (DELTASONE) 5 MG tablet Take 5 mg by mouth daily with breakfast.    [provider]  thiamine 100 MG tablet Take 100 mg by mouth 2 (two) times daily.    [provider]  traZODone (DESYREL) 100 MG tablet Take 1 tablet (100 mg total) by mouth at bedtime as needed for sleep. Patient taking differently: Take 100 mg by mouth at bedtime.  04/25/18   Pucilowska, Jolanta B, MD  tretinoin (RETIN-A) 0.1 % cream Apply 1 application topically at bedtime as needed (facial blemishes).  01/28/18   [provider]  valACYclovir (VALTREX) 1000 MG tablet Take 2 tablets (2,000 mg total) by mouth 2 (two) times daily. 07/21/18   Tereasa Coop, PA-C  venlafaxine XR (EFFEXOR-XR) 150 MG 24 hr capsule Take 1 capsule (150 mg total) by mouth daily with breakfast. Patient taking differently: Take 75 mg by mouth daily with breakfast.  04/26/18   Pucilowska, Jolanta B, MD  venlafaxine XR (EFFEXOR-XR) 75 MG 24 hr capsule Take 75 mg by mouth daily with breakfast.    [provider]  zolpidem (AMBIEN) 10 MG tablet Take 10 mg by mouth at bedtime as needed for sleep.    [provider]    Family History Family History  Problem  Relation Age of Onset  . Arthritis Mother   . Heart disease Mother        ?psvt  . Breast cancer Mother 40       TAH/BSO  . Cancer Mother   . Mental illness Mother   . COPD Father   . Hypertension Father   . Alcohol abuse Father   . Mental illness Father   .  Heart disease Father   . Healthy Daughter   . Breast cancer Maternal Aunt 27       deceased  . Cancer Cousin 48       female; unknown primary  . Colon cancer Paternal Aunt 64       deceased at 63  . Stomach cancer Paternal Uncle 17       deceased at 71  . Alcohol abuse Brother   . Cancer Brother   . Hodgkin's lymphoma Brother   . Mental illness Brother   . HIV Brother   . Healthy Brother   . Healthy Son     Social History Social History   Tobacco Use  . Smoking status: Former Smoker    Packs/day: 1.00    Years: 10.00    Pack years: 10.00    Types: Cigarettes    Quit date: 12/06/2005    Years since quitting: 12.9  . Smokeless tobacco: Never Used  Substance Use Topics  . Alcohol use: Yes    Comment: 02/23/2017 "might have 1-2 drinks/month"  . Drug use: Yes    Types: Marijuana    Comment: "i smoke marijuana to avoid opiates" ; last use January     Allergies   Aspirin, Bee venom, Ibuprofen, Ativan [lorazepam], Ketamine, Toradol [ketorolac tromethamine], and Tramadol hcl   Review of Systems Review of Systems  Constitutional: Negative for chills and fever.  HENT: Negative for congestion, rhinorrhea and sore throat.   Eyes: Negative for visual disturbance.  Respiratory: Negative for cough and shortness of breath.   Cardiovascular: Negative for chest pain and leg swelling.  Gastrointestinal: Negative for abdominal pain, diarrhea, nausea and vomiting.  Genitourinary: Negative for dysuria.  Musculoskeletal: Negative for back pain and neck pain.  Skin: Negative for rash.  Neurological: Negative for dizziness, light-headedness and headaches.  Hematological: Does not bruise/bleed easily.   Psychiatric/Behavioral: Positive for agitation. Negative for confusion, hallucinations and suicidal ideas. The patient is nervous/anxious.      Physical Exam Updated Vital Signs There were no vitals taken for this visit.  Physical Exam Vitals signs and nursing note reviewed.  Constitutional:      Appearance: Normal appearance. She is well-developed.     Comments: Agitated  HENT:     Head: Normocephalic and atraumatic.  Eyes:     Extraocular Movements: Extraocular movements intact.     Conjunctiva/sclera: Conjunctivae normal.     Pupils: Pupils are equal, round, and reactive to light.  Neck:     Musculoskeletal: Normal range of motion and neck supple.  Cardiovascular:     Rate and Rhythm: Normal rate and regular rhythm.     Heart sounds: No murmur.  Pulmonary:     Effort: Pulmonary effort is normal. No respiratory distress.     Breath sounds: Normal breath sounds.  Abdominal:     Palpations: Abdomen is soft.     Tenderness: There is no abdominal tenderness.  Musculoskeletal: Normal range of motion.  Skin:    General: Skin is warm and dry.  Neurological:     General: No focal deficit present.     Mental Status: She is alert.     Cranial Nerves: No cranial nerve deficit.     Motor: No weakness.      ED Treatments / Results  Labs (all labs ordered are listed, but only abnormal results are displayed) Labs Reviewed  COMPREHENSIVE METABOLIC PANEL  ETHANOL  SALICYLATE LEVEL  ACETAMINOPHEN LEVEL  CBC  RAPID URINE DRUG SCREEN, HOSP  PERFORMED    EKG None  Radiology No results found.  Procedures Procedures (including critical care time)  Medications Ordered in ED Medications - No data to display   Initial Impression / Assessment and Plan / ED Course  I have reviewed the triage vital signs and the nursing notes.  Pertinent labs & imaging results that were available during my care of the patient were reviewed by me and considered in my medical decision making  (see chart for details).        Patient brought her self to the emergency department.  Had a good conversation with her.  She is clearly agitated she is states that she needs help with her posttraumatic stress disorder.  Adamantly denied any suicidal or homicidal ideations.  Denied any hallucinations.  Explained patient that we would like to do medical clearance and then get behavioral health to evaluate her which which was her desire.  But patient then changed her mind and she left.  There was no indication to IVC the patient.  Review of records show that she had inpatient care in June and left AMA from there as well.  Reportedly secondary to a family emergency.  Final Clinical Impressions(s) / ED Diagnoses   Final diagnoses:  PTSD (post-traumatic stress disorder)  Agitation    ED Discharge Orders    None       Vanetta Mulders, MD 10/30/18 1058

## 2018-10-30 NOTE — ED Notes (Signed)
On assessment pt denied SI/HI/AVH. She was angry, crying, screaming. This Probation officer had assumed that she might be at harm for self harm because of her presentation, but Dr. Rogene Houston also assessed pt and shed denied SI/HI to him. She was not willing to participate in medical clearance and did not want to give urine, blood, or undress. She wanted to leave. Dr. Rogene Houston aware of all this.   Pt left.

## 2018-10-31 ENCOUNTER — Emergency Department (HOSPITAL_COMMUNITY)
Admission: EM | Admit: 2018-10-31 | Discharge: 2018-11-01 | Disposition: A | Discharge status: Home / Self Care | Payer: Managed Care, Other (non HMO) | Attending: Emergency Medicine | Admitting: Emergency Medicine

## 2018-10-31 ENCOUNTER — Encounter (HOSPITAL_COMMUNITY): Payer: Self-pay | Admitting: Emergency Medicine

## 2018-10-31 ENCOUNTER — Other Ambulatory Visit: Payer: Self-pay

## 2018-10-31 DIAGNOSIS — R451 Restlessness and agitation: Secondary | ICD-10-CM | POA: Insufficient documentation

## 2018-10-31 DIAGNOSIS — Z87891 Personal history of nicotine dependence: Secondary | ICD-10-CM | POA: Diagnosis not present

## 2018-10-31 DIAGNOSIS — R45851 Suicidal ideations: Secondary | ICD-10-CM | POA: Insufficient documentation

## 2018-10-31 DIAGNOSIS — Z79899 Other long term (current) drug therapy: Secondary | ICD-10-CM | POA: Insufficient documentation

## 2018-10-31 DIAGNOSIS — Y902 Blood alcohol level of 40-59 mg/100 ml: Secondary | ICD-10-CM | POA: Insufficient documentation

## 2018-10-31 DIAGNOSIS — F1910 Other psychoactive substance abuse, uncomplicated: Secondary | ICD-10-CM | POA: Insufficient documentation

## 2018-10-31 DIAGNOSIS — Z046 Encounter for general psychiatric examination, requested by authority: Secondary | ICD-10-CM | POA: Diagnosis not present

## 2018-10-31 DIAGNOSIS — F1010 Alcohol abuse, uncomplicated: Secondary | ICD-10-CM | POA: Insufficient documentation

## 2018-10-31 DIAGNOSIS — F918 Other conduct disorders: Secondary | ICD-10-CM | POA: Insufficient documentation

## 2018-10-31 DIAGNOSIS — I10 Essential (primary) hypertension: Secondary | ICD-10-CM | POA: Insufficient documentation

## 2018-10-31 DIAGNOSIS — R4689 Other symptoms and signs involving appearance and behavior: Secondary | ICD-10-CM

## 2018-10-31 DIAGNOSIS — F4325 Adjustment disorder with mixed disturbance of emotions and conduct: Secondary | ICD-10-CM | POA: Diagnosis present

## 2018-10-31 DIAGNOSIS — J45909 Unspecified asthma, uncomplicated: Secondary | ICD-10-CM | POA: Insufficient documentation

## 2018-10-31 DIAGNOSIS — F329 Major depressive disorder, single episode, unspecified: Secondary | ICD-10-CM | POA: Diagnosis not present

## 2018-10-31 DIAGNOSIS — Z96611 Presence of right artificial shoulder joint: Secondary | ICD-10-CM | POA: Insufficient documentation

## 2018-10-31 NOTE — ED Triage Notes (Signed)
Pt from home requesting detox for xanax and ETOH  behavior

## 2018-10-31 NOTE — ED Notes (Signed)
Mrs Neff refusing further care and walked out of department

## 2018-11-01 LAB — COMPREHENSIVE METABOLIC PANEL
ALT: 17 U/L (ref 0–44)
AST: 21 U/L (ref 15–41)
Albumin: 4.1 g/dL (ref 3.5–5.0)
Alkaline Phosphatase: 103 U/L (ref 38–126)
Anion gap: 11 (ref 5–15)
BUN: 14 mg/dL (ref 8–23)
CO2: 24 mmol/L (ref 22–32)
Calcium: 9.2 mg/dL (ref 8.9–10.3)
Chloride: 105 mmol/L (ref 98–111)
Creatinine, Ser: 1.05 mg/dL — ABNORMAL HIGH (ref 0.44–1.00)
GFR calc Af Amer: >60 mL/min (ref >60)
GFR calc non Af Amer: 57 mL/min — ABNORMAL LOW (ref >60)
Glucose, Bld: 102 mg/dL — ABNORMAL HIGH (ref 70–99)
Potassium: 3.6 mmol/L (ref 3.5–5.1)
Sodium: 140 mmol/L (ref 135–145)
Total Bilirubin: 0.3 mg/dL (ref 0.3–1.2)
Total Protein: 7.6 g/dL (ref 6.5–8.1)

## 2018-11-01 LAB — ETHANOL: Alcohol, Ethyl (B): 42 mg/dL — ABNORMAL HIGH (ref <10)

## 2018-11-01 LAB — CBC WITH DIFFERENTIAL/PLATELET
Abs Immature Granulocytes: 0.02 10*3/uL (ref 0.00–0.07)
Basophils Absolute: 0 10*3/uL (ref 0.0–0.1)
Basophils Relative: 1 %
Eosinophils Absolute: 0.1 10*3/uL (ref 0.0–0.5)
Eosinophils Relative: 2 %
HCT: 42.8 % (ref 36.0–46.0)
Hemoglobin: 13.7 g/dL (ref 12.0–15.0)
Immature Granulocytes: 0 %
Lymphocytes Relative: 35 %
Lymphs Abs: 2.1 10*3/uL (ref 0.7–4.0)
MCH: 27.6 pg (ref 26.0–34.0)
MCHC: 32 g/dL (ref 30.0–36.0)
MCV: 86.1 fL (ref 80.0–100.0)
Monocytes Absolute: 0.5 10*3/uL (ref 0.1–1.0)
Monocytes Relative: 8 %
Neutro Abs: 3.2 10*3/uL (ref 1.7–7.7)
Neutrophils Relative %: 54 %
Platelets: 344 10*3/uL (ref 150–400)
RBC: 4.97 MIL/uL (ref 3.87–5.11)
RDW: 13 % (ref 11.5–15.5)
WBC: 5.9 10*3/uL (ref 4.0–10.5)
nRBC: 0 % (ref 0.0–0.2)

## 2018-11-01 LAB — ACETAMINOPHEN LEVEL: Acetaminophen (Tylenol), Serum: <10 ug/mL — ABNORMAL LOW (ref 10–30)

## 2018-11-01 LAB — SALICYLATE LEVEL: Salicylate Lvl: <7 mg/dL (ref 2.8–30.0)

## 2018-11-01 MED ORDER — STERILE WATER FOR INJECTION IJ SOLN
INTRAMUSCULAR | Status: AC
  Administered 2018-11-01: 08:00:00 10 mL
  Filled 2018-11-01: qty 10

## 2018-11-01 MED ORDER — VENLAFAXINE HCL ER 75 MG PO CP24
75.0000 mg | ORAL_CAPSULE | Freq: Every day | ORAL | Status: DC
  Administered 2018-11-01: 13:00:00 75 mg via ORAL
  Filled 2018-11-01: qty 1

## 2018-11-01 MED ORDER — TRAZODONE HCL 100 MG PO TABS: 100.0000 mg | ORAL_TABLET | Freq: Every evening | ORAL | Status: DC | PRN

## 2018-11-01 MED ORDER — ZIPRASIDONE MESYLATE 20 MG IM SOLR
10.0000 mg | Freq: Once | INTRAMUSCULAR | Status: AC
  Administered 2018-11-01: 08:00:00 10 mg via INTRAMUSCULAR
  Filled 2018-11-01: qty 20

## 2018-11-01 MED ORDER — STERILE WATER FOR INJECTION IJ SOLN
1.5000 mL | Freq: Once | INTRAMUSCULAR | Status: AC
  Administered 2018-11-01: 1.5 mL via INTRAMUSCULAR

## 2018-11-01 MED ORDER — VENLAFAXINE HCL ER 75 MG PO CP24
150.0000 mg | ORAL_CAPSULE | Freq: Every day | ORAL | Status: DC
  Filled 2018-11-01: qty 2

## 2018-11-01 MED ORDER — ZIPRASIDONE MESYLATE 20 MG IM SOLR
20.0000 mg | Freq: Once | INTRAMUSCULAR | Status: AC
  Administered 2018-11-01: 01:00:00 20 mg via INTRAMUSCULAR

## 2018-11-01 MED ORDER — ONDANSETRON HCL 4 MG PO TABS: 4.0000 mg | ORAL_TABLET | Freq: Three times a day (TID) | ORAL | Status: DC | PRN

## 2018-11-01 MED ORDER — ALUM & MAG HYDROXIDE-SIMETH 200-200-20 MG/5ML PO SUSP: 30.0000 mL | Freq: Four times a day (QID) | ORAL | Status: DC | PRN

## 2018-11-01 MED ORDER — GABAPENTIN 100 MG PO CAPS
200.0000 mg | ORAL_CAPSULE | Freq: Two times a day (BID) | ORAL | Status: DC
  Administered 2018-11-01: 13:00:00 200 mg via ORAL
  Filled 2018-11-01: qty 2

## 2018-11-01 NOTE — BHH Counselor (Signed)
Per [place], NT is sleeping. Pt to be assessed once alert, aroused and able to engage.    Vertell Novak, [place], [facility], [facility] Triage Specialist [phone]

## 2018-11-01 NOTE — ED Notes (Signed)
Pt woke up this morning and was upset, agitated, screaming, yelling, blaming others, upsetting the milieu with chaos. Allowed IM medication to be give. Actually asked for it, "If I have to be here."

## 2018-11-01 NOTE — BHH Suicide Risk Assessment (Cosign Needed)
Suicide Risk Assessment  Discharge Assessment   [facility] Discharge Suicide Risk Assessment   Principal Problem: Adjustment disorder with mixed disturbance of emotions and conduct Discharge Diagnoses: Principal Problem:   Adjustment disorder with mixed disturbance of emotions and conduct   Total Time spent with patient: 30 minutes  Musculoskeletal: Strength & Muscle Tone: within normal limits Gait & Station: normal Patient leans: N/A  Psychiatric Specialty Exam:   Blood pressure 93/66, pulse 81, temperature (!) 97.5 F (36.4 C), temperature source Oral, resp. rate 18, SpO2 94 %.There is no height or weight on file to calculate BMI.  General Appearance: Casual  Eye Contact::  Fair  Speech:  Clear and Coherent409  Volume:  Normal  Mood:  Anxious and Depressed  Affect:  Congruent and Depressed  Thought Process:  Coherent, Goal Directed and Descriptions of Associations: Intact  Orientation:  Full (Time, Place, and Person)  Thought Content:  Logical  Suicidal Thoughts:  No  Homicidal Thoughts:  No  Memory:  Immediate;   Good Recent;   Good Remote;   Fair  Judgement:  Good  Insight:  Fair  Psychomotor Activity:  Normal  Concentration:  Fair  Recall:  [place] of Knowledge:Good  Language: Good  Akathisia:  Negative  Handed:  Right  AIMS (if indicated):     Assets:  Agricultural consultant Housing Social Support  Sleep:     Cognition: WNL  ADL's:  Intact   Mental Status Per Nursing Assessment::   On Admission:   Pt presented to [facility] under IVC initiated by her husband. Pt is known to this hospital system for becoming agitated and aggressive at home. She stated she is looking to go to a facility in [place] to help her with her mood. Pt's UDS is pending, BAL 42. She stated she drank 2 glasses of wine yesterday. Per [facility], Pt is prescribed Hydrocodone 5-325 mg QID, Adderall 30 mg TID, Adderall ER 30 mg QD, Alprazolam 1 mg QID and Zolpidem 10 mg QHS.  She is under the care of a local psychiatrist and a pain management clinic. Pt stated she and her husband were arguing yesterday and she was out of control.  Pt's IVC will be rescinded. Pt is psychiatrically clear.   Demographic Factors:  Caucasian and Unemployed  Loss Factors: Financial problems/change in socioeconomic status  Historical Factors: Family history of mental illness or substance abuse  Risk Reduction Factors:   Sense of responsibility to family and Living with another person, especially a relative  Continued Clinical Symptoms:  Severe Anxiety and/or Agitation Depression:   Anhedonia Alcohol/Substance Abuse/Dependencies Personality Disorders:   Cluster B  Cognitive Features That Contribute To Risk:  Closed-mindedness    Suicide Risk:  Minimal: No identifiable suicidal ideation.  Patients presenting with no risk factors but with morbid ruminations; may be classified as minimal risk based on the severity of the depressive symptoms   Plan Of Care/Follow-up recommendations:  Activity:  as tolerated Diet:  Heart Healthy  Ethelene Hal, NP 11/01/2018, 12:51 PM

## 2018-11-01 NOTE — ED Notes (Signed)
Triage brought back patient to room 33 for on going monitoring. She was verbally aggressive and unpleasant in ED and was given IM Geodon. She is IVC per her husband. Once she arrived to room 33 she was relaxing with the effects from Geodon. She continues to be demanding, loud at times, yelling out wants and orders intermittently but is tiring so she is slowing down with her demands rude behaviors. Sitter at doorway for her safety and petition alleges she is suicidal and manic.

## 2018-11-01 NOTE — ED Notes (Signed)
Pt so upset ,"I have PTSD" about not having her bra and panties that this writer searched her bra and panties for contraband and allowed her to put them on under supervision. The rationale is that at [facility] pt's are allowed to wear their clothing after they are searched. Pt has a Fentanyl 50 mcg patch on left upper arm discovered when injection was given. Injection given in right upper arm. Pt cooperative with that. Continues to cry and blame.

## 2018-11-01 NOTE — BH Assessment (Signed)
[place] Assessment Progress Note  Per Corena Pilgrim, MD, this pt does not require psychiatric hospitalization at this time.  Pt presents under IVC initiated by pt's husband, which Dr Darleene Cleaver has rescinded.  Pt is to be discharged from [facility] with recommendation to continue treatment with Chucky May, MD.  This has been included in pt's discharge instructions.  Pt's nurse, Diane, has been notified.  Jalene Mullet, [place] Triage Specialist [phone]

## 2018-11-01 NOTE — ED Notes (Signed)
Pt screaming out " I want another shot " nurse notified of this request. Pt also refused 12pm vitals. Stated I could take them later today. Will try again later.

## 2018-11-01 NOTE — BH Assessment (Signed)
[place] Assessment Progress Note Case was staffed with Akintayo MD who recommends patient be discharged later this date.

## 2018-11-01 NOTE — BH Assessment (Addendum)
Assessment Note  Kara DoloresBarbara [name] is an 63 y.o. female that presents this date with IVC. Per IVC: "[name] has not been taking her medications as directed. [name] just found out that her husband of 20 years is having a affair. [name] threatened to harm herself this date with a plan to cut her wrists with a knife". Patient denies content of IVC and is observed to be extremely agitated as this Clinical research associatewriter attempts to assess. Patient is refusing to participate in the assessment and renders limited history. Patient does deny any S/I, H/I or AVH. Patient is observed to be threatening staff and this writer at the time of assessment. Patient is speaking in a loud pressured voice and is screaming at staff and security. Information to complete assessment was obtained from prior history and admission notes. Patient does has a history of alcohol use per chart review and had a noted BAL of 42 on arrival with UDS pending. Patient is well known to area providers and was last seen on 04/22/18 when she presented with similar symptoms. [facility] assists with medication management for ongoing symptoms of depression. Patient does not appear to be responding to any internal stimuli at the time of assessment. Patient has a past medical history of attention deficit disorder, MDD, alcohol abuse and misuse of medications. Case was staffed with Akintayo MD who recommends patient be discharged later this date.     Diagnosis: F33.2 MDD recurrent without psychotic features, severe, Alcohol abuse   Past Medical History:  Past Medical History:  Diagnosis Date  . ADD (attention deficit disorder)    on Adderal  . ADD (attention deficit disorder)   . Aggressive behavior of adult   . Anemia   . Anxiety   . Cellulitis of breast 11/2013   RIGHT BREAST  . Childhood asthma   . 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 Chronic lower back pain   . Chronic pain    went to  [facility]; stopped in 2016 " (02/23/2017)  . Cold sore   . 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 Degenerative disc disease, lumbar   . Depression    takes meds daily  . Family history of malignant neoplasm of breast   . Fibromyalgia   . Fibromyalgia   . H/O degenerative disc disease   . History of kidney stones   . Hypertension   . Osteoporosis   . Osteoporosis   . Other specified rheumatoid arthritis, right shoulder (HCC) 08/01/2011  . Pneumonia 2010?  [name]. Post-nasal drip    hx of  . Pre-diabetes   . PTSD (post-traumatic stress disorder)   . RA (rheumatoid arthritis) (HCC)    autoimmune arthritis  . RA (rheumatoid arthritis) (HCC)   . Spondylitis (HCC)   . Suicidal intent     Past Surgical History:  Procedure Laterality Date  . ANTERIOR CERVICAL DECOMP/DISCECTOMY FUSION N/A 03/15/2015   Procedure: Cervical five-six, Cerival six-seven, Anterior Cervical Discectomy and Fusion, Allograft and Plate;  Surgeon: Eldred MangesMark C Yates, MD;  Location: [facility];  Service: Orthopedics;  Laterality: N/A;  . ANTERIOR LAT LUMBAR FUSION Left 06/11/2018   Procedure: Left Lumbar two-three Anterolateral decompression/fusion/lateral plate fixation;  Surgeon: Barnett AbuElsner, Henry, MD;  Location: [facility];  Service: Neurosurgery;  Laterality: Left;  Left Lumbar two-three Anterolateral decompression/fusion/lateral plate fixation  . AUGMENTATION MAMMAPLASTY  2003  . BACK  SURGERY    . BLADDER SUSPENSION  2009  . BREAST IMPLANT REMOVAL Bilateral 10/2013  . CERVICAL WOUND DEBRIDEMENT N/A 07/07/2016   Procedure: IRRIGATION AND DEBRIDEMENT POSTERIOR NECK;  Surgeon: Marybelle Killings, MD;  Location: [place];  Service: Orthopedics;  Laterality: N/A;  . COLONOSCOPY    . COMBINED ABDOMINOPLASTY AND LIPOSUCTION  2003  . INCISION AND DRAINAGE ABSCESS Right 01/16/2014   Procedure: INCISION AND DRAINAGE AND OF RIGHT BREAST ABCESS;  Surgeon: Excell Seltzer, MD;   Location: [facility];  Service: General;  Laterality: Right;  . JOINT REPLACEMENT    . LUMBAR LAMINECTOMY/DECOMPRESSION MICRODISCECTOMY N/A 12/10/2015   Procedure: Right L3-4 Hemilaminectomy, Excision of herniated nucleus pulposus;  Surgeon: Marybelle Killings, MD;  Location: [place];  Service: Orthopedics;  Laterality: N/A;  . MASS EXCISION  11/03/2011   Procedure: MINOR EXCISION OF MASS;  Surgeon: Cammie Sickle., MD;  Location: [place];  Service: Orthopedics;  Laterality: Left;  debride IP joint, cyst excision left index  . MAXIMUM ACCESS (MAS) TRANSFORAMINAL LUMBAR INTERBODY FUSION (TLIF) 2 LEVEL Right 02/23/2017  . POSTERIOR CERVICAL FUSION/FORAMINOTOMY N/A 06/21/2016   Procedure: POSTERIOR CERVICAL FUSION C5-C7 SPINOUS PROCESS WIRING;  Surgeon: Marybelle Killings, MD;  Location: [place];  Service: Orthopedics;  Laterality: N/A;  . POSTERIOR LUMBAR FUSION  07/2009; 07/03/2014   "L4-5; L5-S1"  . SHOULDER ARTHROSCOPY Right 2012  . TONSILLECTOMY  1987  . TOTAL SHOULDER ARTHROPLASTY  08/01/2011   Procedure: TOTAL SHOULDER ARTHROPLASTY;  Surgeon: Johnny Bridge, MD;  Location: [place];  Service: Orthopedics;  Laterality: Right;  Right total shoulder arthroplasty  . TUBAL LIGATION  1988    Family History:  Family History  Problem Relation Age of Onset  . Arthritis Mother   . Heart disease Mother        ?psvt  . Breast cancer Mother 66       TAH/BSO  . Cancer Mother   . Mental illness Mother   . COPD Father   . Hypertension Father   . Alcohol abuse Father   . Mental illness Father   . Heart disease Father   . Healthy Daughter   . Breast cancer Maternal Aunt 27       deceased  . Cancer Cousin 73       female; unknown primary  . Colon cancer Paternal Aunt 93       deceased at 48  . Stomach cancer Paternal Uncle 65       deceased at 19  . Alcohol abuse Brother   . Cancer Brother   . Hodgkin's lymphoma Brother   . Mental illness Brother   . HIV Brother   . Healthy Brother   .  Healthy Son     Social History:  reports that she quit smoking about 12 years ago. Her smoking use included cigarettes. She has a 10.00 pack-year smoking history. She has never used smokeless tobacco. She reports current alcohol use. She reports current drug use. Drug: Marijuana.  Additional Social History:  Alcohol / Drug Use Pain Medications: See MAR Prescriptions: See MAR Over the Counter: See MAR History of alcohol / drug use?: Yes Longest period of sobriety (when/how long): Unknown Negative Consequences of Use: (Denies) Withdrawal Symptoms: (Denies) Substance #1 Name of Substance 1: Alcohol per history 1 - Age of First Use: UTA 1 - Amount (size/oz): UTA 1 - Frequency: UTA 1 - Duration: UTA 1 - Last Use / Amount: UTA BAL pending  CIWA: CIWA-Ar BP: 93/66 Pulse Rate: 81 COWS:    Allergies:  Allergies  Allergen Reactions  . Aspirin Swelling    Throat swells  . Bee Venom Anaphylaxis  . Ibuprofen Swelling    Throat swells  . Ativan [Lorazepam] Other (See Comments)    Agitation/confusion   . Ketamine Other (See Comments)    Hallucinations  . Toradol [Ketorolac Tromethamine] Itching, Swelling and Rash  . Tramadol Hcl Itching, Swelling and Rash    Tolerates Dilaudid 06/2016.  TDD.    Home Medications: (Not in a hospital admission)   OB/GYN Status:  No LMP recorded. Patient is postmenopausal.  General Assessment Data Assessment unable to be completed: Yes Reason for not completing assessment: At 0418, Per [place], NT is sleeping. Pt to be assessed once alert, aroused and able to engage.  Location of Assessment: [facility] TTS Assessment: In system Is this a Tele or Face-to-Face Assessment?: Face-to-Face Is this an Initial Assessment or a Re-assessment for this encounter?: Initial Assessment Patient Accompanied by:: N/A Language Other than English: No Living Arrangements: Other (Comment) What gender do you identify as?: Female Marital status: Married [place]  [place] Pregnancy Status: No Living Arrangements: Spouse/significant other Can pt return to current living arrangement?: Yes Admission Status: Involuntary Petitioner: Family member Is patient capable of signing voluntary admission?: Yes Referral Source: Self/Family/Friend Insurance type: Medicare  Medical Screening Exam [facility] Walk-in ONLY) Medical Exam completed: Yes  Crisis Care Plan Living Arrangements: Spouse/significant other Legal Guardian: (NA) Name of Psychiatrist: Evelene CroonKaur Name of Therapist: None  Education Status Is patient currently in school?: No Is the patient employed, unemployed or receiving disability?: Receiving disability income  Risk to self with the past 6 months Suicidal Ideation: No Has patient been a risk to self within the past 6 months prior to admission? : No Suicidal Intent: No Has patient had any suicidal intent within the past 6 months prior to admission? : No Is patient at risk for suicide?: Yes Suicidal Plan?: No Has patient had any suicidal plan within the past 6 months prior to admission? : No Access to Means: No What has been your use of drugs/alcohol within the last 12 months?: Current use Previous Attempts/Gestures: Yes How many times?: 1 Other Self Harm Risks: (NA) Triggers for Past Attempts: Unknown Intentional Self Injurious Behavior: None Family Suicide History: No Recent stressful life event(s): Other (Comment)(Issues with husband) Persecutory voices/beliefs?: No Depression: No Depression Symptoms: (Denies) Substance abuse history and/or treatment for substance abuse?: No Suicide prevention information given to non-admitted patients: Not applicable  Risk to Others within the past 6 months Homicidal Ideation: No Does patient have any lifetime risk of violence toward others beyond the six months prior to admission? : No Thoughts of Harm to Others: No Current Homicidal Intent: No Current Homicidal Plan: No Access to Homicidal Means:  No Identified Victim: NA History of harm to others?: No Assessment of Violence: None Noted Violent Behavior Description: (NA) Does patient have access to weapons?: No Criminal Charges Pending?: No Does patient have a court date: No Is patient on probation?: (NA)  Psychosis Hallucinations: None noted Delusions: None noted  Mental Status Report Appearance/Hygiene: In scrubs Eye Contact: Fair Motor Activity: Freedom of movement Speech: Rapid, Pressured, Loud Level of Consciousness: Combative Mood: Threatening Affect: Angry Anxiety Level: Severe Thought Processes: Flight of Ideas Judgement: Impaired Orientation: Unable to assess Obsessive Compulsive Thoughts/Behaviors: Unable to Assess  Cognitive Functioning Concentration: Unable to Assess Memory: Unable to Assess Is patient IDD: No Insight: Unable  to Assess Impulse Control: Unable to Assess Appetite: (UTA) Have you had any weight changes? : (UTA) Sleep: (UTA) Total Hours of Sleep: (UTA) Vegetative Symptoms: (UTA)  ADLScreening [facility] Assessment Services) Patient's cognitive ability adequate to safely complete daily activities?: Yes Patient able to express need for assistance with ADLs?: Yes Independently performs ADLs?: Yes (appropriate for developmental age)  Prior Inpatient Therapy Prior Inpatient Therapy: Yes Prior Therapy Dates: (Mult) Prior Therapy Facilty/Provider(s): [facility], [facility] Reason for Treatment: MH issues   Prior Outpatient Therapy Prior Outpatient Therapy: Yes Prior Therapy Dates: Ongoing Prior Therapy Facilty/Provider(s): [name] MD Reason for Treatment: Med mang Does patient have an ACCT team?: No Does patient have Intensive In-House Services?  : No Does patient have [facility] services? : No Does patient have P4CC services?: No  ADL Screening (condition at time of admission) Patient's cognitive ability adequate to safely complete daily activities?: Yes Is the patient deaf or have difficulty  hearing?: No Does the patient have difficulty seeing, even when wearing glasses/contacts?: No Does the patient have difficulty concentrating, remembering, or making decisions?: No Patient able to express need for assistance with ADLs?: Yes Does the patient have difficulty dressing or bathing?: No Independently performs ADLs?: Yes (appropriate for developmental age) Does the patient have difficulty walking or climbing stairs?: No Weakness of Legs: None Weakness of Arms/Hands: None  Home Assistive Devices/Equipment Home Assistive Devices/Equipment: None  Therapy Consults (therapy consults require a physician order) PT Evaluation Needed: No OT Evalulation Needed: No SLP Evaluation Needed: No Abuse/Neglect Assessment (Assessment to be complete while patient is alone) Physical Abuse: Denies Verbal Abuse: Denies Sexual Abuse: Denies Exploitation of patient/patient's resources: Denies Self-Neglect: Denies Values / Beliefs Cultural Requests During Hospitalization: None Spiritual Requests During Hospitalization: None Consults Spiritual Care Consult Needed: No Social Work Consult Needed: No Merchant navy officer (For Healthcare) Does Patient Have a Medical Advance Directive?: No Does patient want to make changes to medical advance directive?: No - Patient declined Would patient like information on creating a medical advance directive?: No - Patient declined          Disposition: Case was staffed with Akintayo MD who recommends patient be discharged later this date.    Disposition Initial Assessment Completed for this Encounter: Yes Disposition of Patient: (Observe and monitor) Patient refused recommended treatment: No Mode of transportation if patient is discharged/movement?: Loreli Slot)  On Site Evaluation by:   Reviewed with Physician:    Alfredia Ferguson 11/01/2018 10:57 AM

## 2018-11-01 NOTE — ED Notes (Signed)
Pt threw her phone out of triage 4 into triage 3 breaking it. This was witnessed by [place] RN and Armed forces training and education officer. Also by the [facility] officers that are with her.

## 2018-11-01 NOTE — Discharge Instructions (Signed)
For your behavioral health needs, you are advised to continue treatment with Rupinder Kaur, MD: ° °     Rupinder Kaur, MD °     [address] °     [place], [place] [address] °     [phone] °

## 2018-11-01 NOTE — ED Notes (Signed)
Pt brought back to ED by [employer] under IVC by husband's request for suicidal ideation with treat to kill self with kitchen knife

## 2018-11-03 NOTE — ED Provider Notes (Signed)
[facility] DEPT Provider Note   CSN: [id number] Arrival date & time: 10/31/18  2304     History   Chief Complaint Chief Complaint  Patient presents with  . Medical Clearance    pt requesting rehab for ETOH and xanax    HPI Kara [name] is a 63 y.o. female.     Patient initially been seen states she wanted to be shot up with medication to go to sleep so she would go to rehab tomorrow for alcohol and Xanax.  I stated with that this is not something that we do.  She states she was not suicidal or homicidal.  She does not seem to be in any acute psychosis.  She was allowed to leave but apparently her husband stated that she was suicidal and involuntarily committed her.     Past Medical History:  Diagnosis Date  . ADD (attention deficit disorder)    on Adderal  . ADD (attention deficit disorder)   . Aggressive behavior of adult   . Anemia   . Anxiety   . Cellulitis of breast 11/2013   RIGHT BREAST  . Childhood asthma   . Chronic fatigue and immune dysfunction syndrome ([place])   . Chronic lower back pain   . Chronic pain    went to [facility] for pain control; stopped in 2016 " (02/23/2017)  . Cold sore   . 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 Degenerative disc disease, lumbar   . Depression    takes meds daily  . Family history of malignant neoplasm of breast   . Fibromyalgia   . Fibromyalgia   . H/O degenerative disc disease   . History of kidney stones   . Hypertension   . Osteoporosis   . Osteoporosis   . Other specified rheumatoid arthritis, right shoulder ([place]) 08/01/2011  . Pneumonia 2010?  [name] Post-nasal drip    hx of  . Pre-diabetes   . PTSD (post-traumatic stress disorder)   . RA (rheumatoid arthritis) (H[facility])    autoimmune arthritis  . RA (rheumatoid arthritis) ([place])   . Spondylitis ([place])   . Suicidal intent     Patient Active Problem List   Diagnosis Date Noted  .  Lumbar stenosis with neurogenic claudication 06/11/2018  . 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 ADD (attention deficit disorder) 09/02/2017  . Major depressive disorder, recurrent severe without psychotic features ([place]) 09/01/2017  . Sedative, hypnotic or anxiolytic use disorder, severe, dependence ([place]) 08/31/2017  . Cannabis use disorder, moderate, dependence ([place]) 08/31/2017  . Decreased libido 03/29/2017  . Lumbar stenosis 02/23/2017  . Spinal stenosis of lumbar region with neurogenic claudication 02/19/2017  . S/P lumbar spinal fusion 01/01/2017  . Essential hypertension 07/26/2016  . DNR (do not resuscitate) discussion   . Palliative care by specialist   . Cervical pseudoarthrosis ([place]) 06/21/2016  . Opioid use disorder, moderate, dependence ([place]) 06/14/2016  . Mitral valve prolapse 01/04/2016  . HNP (herniated nucleus pulposus), lumbar 12/10/2015  . Prediabetes 08/19/2015  . Overweight (BMI 25.0-29.9) 08/18/2015  . Chronic pain 08/18/2015  . Vitamin D deficiency 08/18/2015  . Chronic fatigue disorder 06/14/2015  . S/P cervical spinal fusion 03/15/2015  . PTSD (post-traumatic stress disorder) 03/07/2014  . Suicide threat or attempt 03/06/2014  . Aggressive behavior   . Family history of malignant neoplasm of breast   . Rheumatoid arthritis ([place]) 12/13/2011  .  Fibromyalgia 12/13/2011  . H/O cold sores 12/13/2011    Past Surgical History:  Procedure Laterality Date  . ANTERIOR CERVICAL DECOMP/DISCECTOMY FUSION N/A 03/15/2015   Procedure: Cervical five-six, Cerival six-seven, Anterior Cervical Discectomy and Fusion, Allograft and Plate;  Surgeon: Eldred MangesMark C Yates, MD;  Location: [facility] OR;  Service: Orthopedics;  Laterality: N/A;  . ANTERIOR LAT LUMBAR FUSION Left 06/11/2018   Procedure: Left Lumbar two-three Anterolateral decompression/fusion/lateral plate fixation;  Surgeon: Barnett AbuElsner, Henry, MD;  Location: [facility] OR;  Service: Neurosurgery;   Laterality: Left;  Left Lumbar two-three Anterolateral decompression/fusion/lateral plate fixation  . AUGMENTATION MAMMAPLASTY  2003  . BACK SURGERY    . BLADDER SUSPENSION  2009  . BREAST IMPLANT REMOVAL Bilateral 10/2013  . CERVICAL WOUND DEBRIDEMENT N/A 07/07/2016   Procedure: IRRIGATION AND DEBRIDEMENT POSTERIOR NECK;  Surgeon: Eldred MangesMark C Yates, MD;  Location: [facility] OR;  Service: Orthopedics;  Laterality: N/A;  . COLONOSCOPY    . COMBINED ABDOMINOPLASTY AND LIPOSUCTION  2003  . INCISION AND DRAINAGE ABSCESS Right 01/16/2014   Procedure: INCISION AND DRAINAGE AND OF RIGHT BREAST ABCESS;  Surgeon: Glenna FellowsBenjamin Hoxworth, MD;  Location: [facility]S;  Service: General;  Laterality: Right;  . JOINT REPLACEMENT    . LUMBAR LAMINECTOMY/DECOMPRESSION MICRODISCECTOMY N/A 12/10/2015   Procedure: Right L3-4 Hemilaminectomy, Excision of herniated nucleus pulposus;  Surgeon: Eldred MangesMark C Yates, MD;  [facility];  Service: Orthopedics;  Laterality: N/A;  . MASS EXCISION  11/03/2011   Procedure: MINOR EXCISION OF MASS;  Surgeon: Wyn Forsterobert V Sypher Jr., MD;  Location: [facility];  Service: Orthopedics;  Laterality: Left;  debride IP joint, cyst excision left index  . MAXIMUM ACCESS (MAS) TRANSFORAMINAL LUMBAR INTERBODY FUSION (TLIF) 2 LEVEL Right 02/23/2017  . POSTERIOR CERVICAL FUSION/FORAMINOTOMY N/A 06/21/2016   Procedure: POSTERIOR CERVICAL FUSION C5-C7 SPINOUS PROCESS WIRING;  Surgeon: Eldred MangesYates, Mark C, MD;  Location: [facility] OR;  Service: Orthopedics;  Laterality: N/A;  . POSTERIOR LUMBAR FUSION  07/2009; 07/03/2014   "L4-5; L5-S1"  . SHOULDER ARTHROSCOPY Right 2012  . TONSILLECTOMY  1987  . TOTAL SHOULDER ARTHROPLASTY  08/01/2011   Procedure: TOTAL SHOULDER ARTHROPLASTY;  Surgeon: Eulas PostJoshua P Landau, MD;  Location: [facility] OR;  Service: Orthopedics;  Laterality: Right;  Right total shoulder arthroplasty  . TUBAL LIGATION  1988     OB History   No obstetric history on file.      Home Medications    Prior to Admission  medications   Medication Sig Start Date End Date Taking? Authorizing Provider  alendronate (FOSAMAX) 35 MG tablet Take 1 tablet (35 mg total) by mouth every 7 (seven) days. Take with a full glass of water on an empty stomach. 04/04/18   Kuneff, Renee A, DO  ALPRAZolam (XANAX XR) 1 MG 24 hr tablet Take 1 mg by mouth 2 (two) times daily as needed for anxiety.     [provider]  amphetamine-dextroamphetamine (ADDERALL XR) 30 MG 24 hr capsule Take 30 mg by mouth 2 (two) times daily.     [provider]  Biotin 1610910000 MCG TABS Take 10,000 mcg by mouth daily.    [provider]  cholecalciferol (VITAMIN D) 1000 units tablet Take 1,000 Units by mouth daily.     [provider]  dexamethasone (DECADRON) 1 MG tablet 2 tablets twice daily for 2 days, one tablet twice daily for 2 days, one tablet daily for 2 days. 06/13/18   Barnett AbuElsner, Henry, MD  diclofenac sodium (VOLTAREN) 1 % GEL Apply 1 application  topically 2 (two) times daily as needed (joint pain).  01/13/15   [provider]  folic acid (FOLVITE) 800 MCG tablet Take 800 mcg by mouth daily.    [provider]  HYDROmorphone (DILAUDID) 2 MG tablet Take 1-2 tablets (2-4 mg total) by mouth every 3 (three) hours as needed for moderate pain or severe pain. 06/13/18   Barnett Abu, MD  lisinopril (PRINIVIL,ZESTRIL) 10 MG tablet TAKE 1 AND 1/2 TABLETS DAILY BY MOUTH Patient taking differently: Take 10 mg by mouth daily.  03/07/18   Kuneff, Renee A, DO  Lysine 1000 MG TABS Take 1,000 mg by mouth daily.    [provider]  magnesium oxide (MAG-OX) 400 MG tablet Take 400 mg by mouth daily.    [provider]  meloxicam (MOBIC) 15 MG tablet TAKE 1 TABLET (15 MG) BY MOUTH EVERY DAY Patient taking differently: Take 15 mg by mouth daily.  07/06/17   Kerrin Champagne, MD  methocarbamol (ROBAXIN) 500 MG tablet Take 1 tablet (500 mg total) by mouth 2 (two) times daily. Patient taking differently: Take 500  mg by mouth 2 (two) times daily as needed for muscle spasms.  03/17/18   Elpidio Anis, PA-C  methocarbamol (ROBAXIN) 500 MG tablet Take 1 tablet (500 mg total) by mouth every 6 (six) hours as needed for muscle spasms. 06/13/18   Barnett Abu, MD  OVER THE COUNTER MEDICATION Take 1 tablet by mouth daily. Bone Strength    [provider]  prazosin (MINIPRESS) 2 MG capsule Take 1 capsule (2 mg total) by mouth 2 (two) times daily. Patient taking differently: Take 2 mg by mouth 2 (two) times daily.  09/05/17   Pucilowska, Braulio Conte B, MD  predniSONE (DELTASONE) 5 MG tablet Take 5 mg by mouth daily with breakfast.    [provider]  thiamine 100 MG tablet Take 100 mg by mouth 2 (two) times daily.    [provider]  traZODone (DESYREL) 100 MG tablet Take 1 tablet (100 mg total) by mouth at bedtime as needed for sleep. Patient taking differently: Take 100 mg by mouth at bedtime.  04/25/18   Pucilowska, Jolanta B, MD  tretinoin (RETIN-A) 0.1 % cream Apply 1 application topically at bedtime as needed (facial blemishes).  01/28/18   [provider]  valACYclovir (VALTREX) 1000 MG tablet Take 2 tablets (2,000 mg total) by mouth 2 (two) times daily. 07/21/18   Ofilia Neas, PA-C  venlafaxine XR (EFFEXOR-XR) 150 MG 24 hr capsule Take 1 capsule (150 mg total) by mouth daily with breakfast. Patient taking differently: Take 75 mg by mouth daily with breakfast.  04/26/18   Pucilowska, Jolanta B, MD  venlafaxine XR (EFFEXOR-XR) 75 MG 24 hr capsule Take 75 mg by mouth daily with breakfast.    [provider]  zolpidem (AMBIEN) 10 MG tablet Take 10 mg by mouth at bedtime as needed for sleep.    [provider]    Family History Family History  Problem Relation Age of Onset  . Arthritis Mother   . Heart disease Mother        ?psvt  . Breast cancer Mother 85       TAH/BSO  . Cancer Mother   . Mental illness Mother   . COPD Father   . Hypertension Father    . Alcohol abuse Father   . Mental illness Father   . Heart disease Father   . Healthy Daughter   . Breast cancer Maternal  Aunt 27       deceased  . Cancer Cousin [id number]       female; unknown primary  . Colon cancer Paternal Aunt [id number]       deceased at 8767  . Stomach cancer Paternal Uncle [id number]       deceased at 8492  . Alcohol abuse Brother   . Cancer Brother   . Hodgkin's lymphoma Brother   . Mental illness Brother   . HIV Brother   . Healthy Brother   . Healthy Son     Social History Social History   Tobacco Use  . Smoking status: Former Smoker    Packs/day: 1.00    Years: 10.00    Pack years: 10.00    Types: Cigarettes    Quit date: 12/06/2005    Years since quitting: 12.9  . Smokeless tobacco: Never Used  Substance Use Topics  . Alcohol use: Yes    Comment: 02/23/2017 "might have 1-2 drinks/month"  . Drug use: Yes    Types: Marijuana    Comment: "i smoke marijuana to avoid opiates" ; last use January     Allergies   Aspirin, Bee venom, Ibuprofen, Ativan [lorazepam], Ketamine, Toradol [ketorolac tromethamine], and Tramadol hcl   Review of Systems Review of Systems  All other systems reviewed and are negative.    Physical Exam Updated Vital Signs BP 116/82 (BP Location: Right Arm)   Pulse 85   Temp 97.8 F (36.6 C) (Oral)   Resp 20   SpO2 98%   Physical Exam Vitals signs and nursing note reviewed.  Constitutional:      Appearance: She is well-developed.  HENT:     Head: Normocephalic and atraumatic.     Nose: No congestion or rhinorrhea.     Mouth/Throat:     Mouth: Mucous membranes are dry.     Pharynx: Oropharynx is clear.  Eyes:     Conjunctiva/sclera: Conjunctivae normal.  Neck:     Musculoskeletal: Normal range of motion.  Cardiovascular:     Rate and Rhythm: Normal rate and regular rhythm.  Pulmonary:     Effort: No respiratory distress.     Breath sounds: No stridor.  Abdominal:     General: There is no distension.     Palpations: Abdomen  is soft.  Neurological:     Mental Status: She is alert.  Psychiatric:        Mood and Affect: Affect is labile, angry and tearful.        Speech: Speech normal.        Behavior: Behavior is agitated and aggressive.        Thought Content: Thought content is not paranoid or delusional. Thought content does not include homicidal or suicidal ideation. Thought content does not include homicidal or suicidal plan.      ED Treatments / Results  Labs (all labs ordered are listed, but only abnormal results are displayed) Labs Reviewed  COMPREHENSIVE METABOLIC PANEL - Abnormal; Notable for the following components:      Result Value   Glucose, Bld 102 (*)    Creatinine, Ser 1.05 (*)    GFR calc non Af Amer 57 (*)    All other components within normal limits  ETHANOL - Abnormal; Notable for the following components:   Alcohol, Ethyl (B) 42 (*)    All other components within normal limits  ACETAMINOPHEN LEVEL - Abnormal; Notable for the following components:   Acetaminophen (Tylenol), Serum <10 (*)  All other components within normal limits  CBC WITH DIFFERENTIAL/PLATELET  SALICYLATE LEVEL    EKG EKG Interpretation  Date/Time:  Friday November 01 2018 01:11:20 EDT Ventricular Rate:  85 PR Interval:    QRS Duration: 94 QT Interval:  383 QTC Calculation: 456 R Axis:   55 Text Interpretation:  Sinus rhythm Probable left atrial enlargement RSR' in V1 or V2, right VCD or RVH Baseline wander in lead(s) V3 Low voltage QRS When compared with ECG of 10/09/2018, No significant change was found Confirmed by Dione Booze ([id number]) on 11/01/2018 2:32:11 AM Also confirmed by Dione Booze ([id number]), editor Barbette Hair [phone])  on 11/01/2018 6:57:32 AM   Radiology No results found.  Procedures Procedures (including critical care time)  Medications Ordered in ED Medications  ziprasidone (GEODON) injection 20 mg (20 mg Intramuscular Given 11/01/18 0039)  sterile water (preservative free) injection  1.5 mL (1.5 mLs Injection Given 11/01/18 0046)  ziprasidone (GEODON) injection 10 mg (10 mg Intramuscular Given 11/01/18 0804)  sterile water (preservative free) injection (10 mLs  Given 11/01/18 0804)     Initial Impression / Assessment and Plan / ED Course  I have reviewed the triage vital signs and the nursing notes.  Pertinent labs & imaging results that were available during my care of the patient were reviewed by me and considered in my medical decision making (see chart for details).        Medically cleared for TTS consult.   Final Clinical Impressions(s) / ED Diagnoses   Final diagnoses:  None    ED Discharge Orders    None       Elesia Pemberton, Belkys Cower, MD 11/03/18 [phone]

## 2018-11-20 ENCOUNTER — Encounter (HOSPITAL_BASED_OUTPATIENT_CLINIC_OR_DEPARTMENT_OTHER): Payer: Self-pay | Admitting: *Deleted

## 2018-11-20 ENCOUNTER — Other Ambulatory Visit: Payer: Self-pay

## 2018-11-22 ENCOUNTER — Other Ambulatory Visit (HOSPITAL_COMMUNITY)
Admission: RE | Admit: 2018-11-22 | Discharge: 2018-11-22 | Disposition: A | Discharge status: Home / Self Care | Payer: 59 | Source: Ambulatory Visit | Attending: Orthopedic Surgery | Admitting: Orthopedic Surgery

## 2018-11-22 DIAGNOSIS — Z01812 Encounter for preprocedural laboratory examination: Secondary | ICD-10-CM | POA: Insufficient documentation

## 2018-11-22 DIAGNOSIS — Z20828 Contact with and (suspected) exposure to other viral communicable diseases: Secondary | ICD-10-CM | POA: Insufficient documentation

## 2018-11-22 LAB — SARS CORONAVIRUS 2 (TAT 6-24 HRS): SARS Coronavirus 2: NEGATIVE

## 2018-11-26 ENCOUNTER — Ambulatory Visit (HOSPITAL_BASED_OUTPATIENT_CLINIC_OR_DEPARTMENT_OTHER): Payer: 59 | Admitting: Anesthesiology

## 2018-11-26 ENCOUNTER — Encounter (HOSPITAL_BASED_OUTPATIENT_CLINIC_OR_DEPARTMENT_OTHER)
Admission: RE | Disposition: A | Discharge status: Home / Self Care | Payer: Self-pay | Source: Home / Self Care | Attending: Orthopedic Surgery

## 2018-11-26 ENCOUNTER — Encounter (HOSPITAL_BASED_OUTPATIENT_CLINIC_OR_DEPARTMENT_OTHER): Payer: Self-pay | Admitting: *Deleted

## 2018-11-26 ENCOUNTER — Ambulatory Visit (HOSPITAL_BASED_OUTPATIENT_CLINIC_OR_DEPARTMENT_OTHER)
Admission: RE | Admit: 2018-11-26 | Discharge: 2018-11-26 | Disposition: A | Discharge status: Home / Self Care | Payer: 59 | Attending: Orthopedic Surgery | Admitting: Orthopedic Surgery

## 2018-11-26 DIAGNOSIS — Z803 Family history of malignant neoplasm of breast: Secondary | ICD-10-CM | POA: Insufficient documentation

## 2018-11-26 DIAGNOSIS — J45909 Unspecified asthma, uncomplicated: Secondary | ICD-10-CM | POA: Insufficient documentation

## 2018-11-26 DIAGNOSIS — Z811 Family history of alcohol abuse and dependence: Secondary | ICD-10-CM | POA: Insufficient documentation

## 2018-11-26 DIAGNOSIS — F329 Major depressive disorder, single episode, unspecified: Secondary | ICD-10-CM | POA: Insufficient documentation

## 2018-11-26 DIAGNOSIS — Z885 Allergy status to narcotic agent status: Secondary | ICD-10-CM | POA: Insufficient documentation

## 2018-11-26 DIAGNOSIS — Z8261 Family history of arthritis: Secondary | ICD-10-CM | POA: Insufficient documentation

## 2018-11-26 DIAGNOSIS — R7303 Prediabetes: Secondary | ICD-10-CM | POA: Diagnosis not present

## 2018-11-26 DIAGNOSIS — M5136 Other intervertebral disc degeneration, lumbar region: Secondary | ICD-10-CM | POA: Insufficient documentation

## 2018-11-26 DIAGNOSIS — Z888 Allergy status to other drugs, medicaments and biological substances status: Secondary | ICD-10-CM | POA: Insufficient documentation

## 2018-11-26 DIAGNOSIS — M810 Age-related osteoporosis without current pathological fracture: Secondary | ICD-10-CM | POA: Diagnosis not present

## 2018-11-26 DIAGNOSIS — Z91030 Bee allergy status: Secondary | ICD-10-CM | POA: Diagnosis not present

## 2018-11-26 DIAGNOSIS — M797 Fibromyalgia: Secondary | ICD-10-CM | POA: Insufficient documentation

## 2018-11-26 DIAGNOSIS — F988 Other specified behavioral and emotional disorders with onset usually occurring in childhood and adolescence: Secondary | ICD-10-CM | POA: Diagnosis not present

## 2018-11-26 DIAGNOSIS — F419 Anxiety disorder, unspecified: Secondary | ICD-10-CM | POA: Insufficient documentation

## 2018-11-26 DIAGNOSIS — Z96611 Presence of right artificial shoulder joint: Secondary | ICD-10-CM | POA: Insufficient documentation

## 2018-11-26 DIAGNOSIS — R5382 Chronic fatigue, unspecified: Secondary | ICD-10-CM | POA: Insufficient documentation

## 2018-11-26 DIAGNOSIS — Z8249 Family history of ischemic heart disease and other diseases of the circulatory system: Secondary | ICD-10-CM | POA: Insufficient documentation

## 2018-11-26 DIAGNOSIS — Z807 Family history of other malignant neoplasms of lymphoid, hematopoietic and related tissues: Secondary | ICD-10-CM | POA: Insufficient documentation

## 2018-11-26 DIAGNOSIS — Z886 Allergy status to analgesic agent status: Secondary | ICD-10-CM | POA: Insufficient documentation

## 2018-11-26 DIAGNOSIS — Z87891 Personal history of nicotine dependence: Secondary | ICD-10-CM | POA: Insufficient documentation

## 2018-11-26 DIAGNOSIS — Z981 Arthrodesis status: Secondary | ICD-10-CM | POA: Diagnosis not present

## 2018-11-26 DIAGNOSIS — Z87442 Personal history of urinary calculi: Secondary | ICD-10-CM | POA: Insufficient documentation

## 2018-11-26 DIAGNOSIS — M069 Rheumatoid arthritis, unspecified: Secondary | ICD-10-CM | POA: Diagnosis not present

## 2018-11-26 DIAGNOSIS — M1811 Unilateral primary osteoarthritis of first carpometacarpal joint, right hand: Secondary | ICD-10-CM | POA: Diagnosis not present

## 2018-11-26 DIAGNOSIS — M19041 Primary osteoarthritis, right hand: Secondary | ICD-10-CM | POA: Diagnosis present

## 2018-11-26 DIAGNOSIS — Z8489 Family history of other specified conditions: Secondary | ICD-10-CM | POA: Insufficient documentation

## 2018-11-26 DIAGNOSIS — Z818 Family history of other mental and behavioral disorders: Secondary | ICD-10-CM | POA: Insufficient documentation

## 2018-11-26 DIAGNOSIS — I10 Essential (primary) hypertension: Secondary | ICD-10-CM | POA: Diagnosis not present

## 2018-11-26 DIAGNOSIS — M19042 Primary osteoarthritis, left hand: Secondary | ICD-10-CM | POA: Diagnosis not present

## 2018-11-26 DIAGNOSIS — Z800 Family history of malignant neoplasm of digestive organs: Secondary | ICD-10-CM | POA: Insufficient documentation

## 2018-11-26 HISTORY — PX: TENDON TRANSFER: SHX6109

## 2018-11-26 HISTORY — PX: BONE EXCISION: SHX6730

## 2018-11-26 HISTORY — PX: CARPOMETACARPEL SUSPENSION PLASTY: SHX5005

## 2018-11-26 SURGERY — BONE EXCISION
Anesthesia: Monitor Anesthesia Care | Site: Hand | Laterality: Right

## 2018-11-26 MED ORDER — FENTANYL CITRATE (PF) 100 MCG/2ML IJ SOLN
INTRAMUSCULAR | Status: AC
  Filled 2018-11-26: qty 2

## 2018-11-26 MED ORDER — MIDAZOLAM HCL 2 MG/2ML IJ SOLN
INTRAMUSCULAR | Status: AC
  Filled 2018-11-26: qty 2

## 2018-11-26 MED ORDER — LACTATED RINGERS IV SOLN
INTRAVENOUS | Status: DC
  Administered 2018-11-26: 09:00:00 via INTRAVENOUS

## 2018-11-26 MED ORDER — MIDAZOLAM HCL 2 MG/2ML IJ SOLN
1.0000 mg | INTRAMUSCULAR | Status: DC | PRN
  Administered 2018-11-26 (×2): 2 mg via INTRAVENOUS

## 2018-11-26 MED ORDER — PROPOFOL 10 MG/ML IV BOLUS
INTRAVENOUS | Status: DC | PRN
  Administered 2018-11-26 (×2): 20 mg via INTRAVENOUS

## 2018-11-26 MED ORDER — FENTANYL CITRATE (PF) 100 MCG/2ML IJ SOLN: 25.0000 ug | INTRAMUSCULAR | Status: DC | PRN

## 2018-11-26 MED ORDER — ONDANSETRON HCL 4 MG/2ML IJ SOLN
INTRAMUSCULAR | Status: DC | PRN
  Administered 2018-11-26: 4 mg via INTRAVENOUS

## 2018-11-26 MED ORDER — ONDANSETRON HCL 4 MG/2ML IJ SOLN
INTRAMUSCULAR | Status: AC
  Filled 2018-11-26: qty 2

## 2018-11-26 MED ORDER — SUCCINYLCHOLINE CHLORIDE 200 MG/10ML IV SOSY
PREFILLED_SYRINGE | INTRAVENOUS | Status: AC
  Filled 2018-11-26: qty 10

## 2018-11-26 MED ORDER — PROPOFOL 500 MG/50ML IV EMUL
INTRAVENOUS | Status: DC | PRN
  Administered 2018-11-26: 125 ug/kg/min via INTRAVENOUS

## 2018-11-26 MED ORDER — EPHEDRINE 5 MG/ML INJ
INTRAVENOUS | Status: AC
  Filled 2018-11-26: qty 10

## 2018-11-26 MED ORDER — PHENYLEPHRINE 40 MCG/ML (10ML) SYRINGE FOR IV PUSH (FOR BLOOD PRESSURE SUPPORT)
PREFILLED_SYRINGE | INTRAVENOUS | Status: AC
  Filled 2018-11-26: qty 10

## 2018-11-26 MED ORDER — CEFAZOLIN SODIUM-DEXTROSE 2-4 GM/100ML-% IV SOLN
INTRAVENOUS | Status: AC
  Filled 2018-11-26: qty 100

## 2018-11-26 MED ORDER — FENTANYL CITRATE (PF) 100 MCG/2ML IJ SOLN
50.0000 ug | INTRAMUSCULAR | Status: AC | PRN
  Administered 2018-11-26: 100 ug via INTRAVENOUS
  Administered 2018-11-26 (×2): 50 ug via INTRAVENOUS

## 2018-11-26 MED ORDER — OXYCODONE-ACETAMINOPHEN 5-325 MG PO TABS: 1.0000 | ORAL_TABLET | ORAL | 0 refills | Status: DC | PRN

## 2018-11-26 MED ORDER — CEFAZOLIN SODIUM-DEXTROSE 2-4 GM/100ML-% IV SOLN
2.0000 g | INTRAVENOUS | Status: AC
  Administered 2018-11-26: 2 g via INTRAVENOUS

## 2018-11-26 MED ORDER — SCOPOLAMINE 1 MG/3DAYS TD PT72: 1.0000 | MEDICATED_PATCH | Freq: Once | TRANSDERMAL | Status: DC

## 2018-11-26 MED ORDER — LIDOCAINE 2% (20 MG/ML) 5 ML SYRINGE
INTRAMUSCULAR | Status: AC
  Filled 2018-11-26: qty 5

## 2018-11-26 MED ORDER — ROPIVACAINE HCL 7.5 MG/ML IJ SOLN
INTRAMUSCULAR | Status: DC | PRN
  Administered 2018-11-26: 30 mL via PERINEURAL

## 2018-11-26 MED ORDER — ONDANSETRON HCL 4 MG/2ML IJ SOLN: 4.0000 mg | Freq: Once | INTRAMUSCULAR | Status: DC | PRN

## 2018-11-26 MED ORDER — CHLORHEXIDINE GLUCONATE 4 % EX LIQD: 60.0000 mL | Freq: Once | CUTANEOUS | Status: DC

## 2018-11-26 SURGICAL SUPPLY — 78 items
BAG DECANTER FOR FLEXI CONT (MISCELLANEOUS) IMPLANT
BIT DRILL PASSING CMC 1/4 FLEX (BIT) ×2 IMPLANT
BLADE MINI RND TIP GREEN BEAV (BLADE) ×4 IMPLANT
BLADE SURG 15 STRL LF DISP TIS (BLADE) ×2 IMPLANT
BLADE SURG 15 STRL SS (BLADE) ×2
BNDG COHESIVE 1X5 TAN STRL LF (GAUZE/BANDAGES/DRESSINGS) ×4 IMPLANT
BNDG COHESIVE 3X5 TAN STRL LF (GAUZE/BANDAGES/DRESSINGS) ×4 IMPLANT
BNDG ESMARK 4X9 LF (GAUZE/BANDAGES/DRESSINGS) ×4 IMPLANT
BNDG GAUZE ELAST 4 BULKY (GAUZE/BANDAGES/DRESSINGS) ×4 IMPLANT
BUTTON ALL-SUT W/BACKSTOP (Orthopedic Implant) ×4 IMPLANT
CHLORAPREP W/TINT 26 (MISCELLANEOUS) ×4 IMPLANT
CORD BIPOLAR FORCEPS 12FT (ELECTRODE) ×4 IMPLANT
COTTONBALL LRG STERILE PKG (GAUZE/BANDAGES/DRESSINGS) IMPLANT
COVER BACK TABLE REUSABLE LG (DRAPES) ×4 IMPLANT
COVER MAYO STAND REUSABLE (DRAPES) ×4 IMPLANT
COVER WAND RF STERILE (DRAPES) IMPLANT
CUFF TOURN SGL QUICK 18X4 (TOURNIQUET CUFF) ×4 IMPLANT
DECANTER SPIKE VIAL GLASS SM (MISCELLANEOUS) IMPLANT
DRAIN TLS ROUND 10FR (DRAIN) IMPLANT
DRAPE EXTREMITY T 121X128X90 (DISPOSABLE) ×4 IMPLANT
DRAPE OEC MINIVIEW 54X84 (DRAPES) ×4 IMPLANT
DRAPE SURG 17X23 STRL (DRAPES) ×4 IMPLANT
DRILL PASSING CMC 1/4 FLEX (BIT) ×4
DRSG PAD ABDOMINAL 8X10 ST (GAUZE/BANDAGES/DRESSINGS) IMPLANT
GAUZE 4X4 16PLY RFD (DISPOSABLE) IMPLANT
GAUZE SPONGE 4X4 12PLY STRL (GAUZE/BANDAGES/DRESSINGS) ×4 IMPLANT
GAUZE XEROFORM 1X8 LF (GAUZE/BANDAGES/DRESSINGS) ×4 IMPLANT
GLOVE BIOGEL PI IND STRL 7.5 (GLOVE) ×4 IMPLANT
GLOVE BIOGEL PI IND STRL 8.5 (GLOVE) ×4 IMPLANT
GLOVE BIOGEL PI INDICATOR 7.5 (GLOVE) ×4
GLOVE BIOGEL PI INDICATOR 8.5 (GLOVE) ×4
GLOVE SURG ORTHO 8.0 STRL STRW (GLOVE) ×8 IMPLANT
GOWN STRL REUS W/ TWL LRG LVL3 (GOWN DISPOSABLE) ×2 IMPLANT
GOWN STRL REUS W/TWL LRG LVL3 (GOWN DISPOSABLE) ×2
GOWN STRL REUS W/TWL XL LVL3 (GOWN DISPOSABLE) ×4 IMPLANT
K-WIRE .035X4 (WIRE) IMPLANT
LOOP VESSEL MAXI BLUE (MISCELLANEOUS) IMPLANT
NEEDLE HYPO 22GX1.5 SAFETY (NEEDLE) IMPLANT
NEEDLE KEITH (NEEDLE) IMPLANT
NEEDLE PRECISIONGLIDE 27X1.5 (NEEDLE) IMPLANT
NS IRRIG 1000ML POUR BTL (IV SOLUTION) ×4 IMPLANT
PACK BASIN DAY SURGERY FS (CUSTOM PROCEDURE TRAY) ×4 IMPLANT
PAD CAST 3X4 CTTN HI CHSV (CAST SUPPLIES) ×2 IMPLANT
PADDING CAST ABS 3INX4YD NS (CAST SUPPLIES) ×2
PADDING CAST ABS 4INX4YD NS (CAST SUPPLIES) ×2
PADDING CAST ABS COTTON 3X4 (CAST SUPPLIES) ×2 IMPLANT
PADDING CAST ABS COTTON 4X4 ST (CAST SUPPLIES) ×2 IMPLANT
PADDING CAST COTTON 3X4 STRL (CAST SUPPLIES) ×2
SLEEVE SCD COMPRESS KNEE MED (MISCELLANEOUS) ×4 IMPLANT
SPLINT PLASTER CAST XFAST 3X15 (CAST SUPPLIES) ×20 IMPLANT
SPLINT PLASTER XTRA FASTSET 3X (CAST SUPPLIES) ×20
STOCKINETTE 4X48 STRL (DRAPES) ×4 IMPLANT
SUT CHROMIC 5 0 P 3 (SUTURE) IMPLANT
SUT ETHIBOND 3-0 V-5 (SUTURE) ×4 IMPLANT
SUT ETHILON 4 0 PS 2 18 (SUTURE) ×4 IMPLANT
SUT FIBERWIRE 2-0 18 17.9 3/8 (SUTURE)
SUT FIBERWIRE 4-0 18 DIAM BLUE (SUTURE)
SUT FIBERWIRE 4-0 18 TAPR NDL (SUTURE)
SUT MERSILENE 2.0 SH NDLE (SUTURE) IMPLANT
SUT MERSILENE 4 0 P 3 (SUTURE) IMPLANT
SUT PROLENE 2 0 SH DA (SUTURE) IMPLANT
SUT SILK 2 0 PERMA HAND 18 BK (SUTURE) IMPLANT
SUT SILK 4 0 PS 2 (SUTURE) IMPLANT
SUT STEEL 3 0 (SUTURE) ×4 IMPLANT
SUT VIC AB 3-0 PS1 18 (SUTURE)
SUT VIC AB 3-0 PS1 18XBRD (SUTURE) IMPLANT
SUT VIC AB 4-0 P-3 18XBRD (SUTURE) IMPLANT
SUT VIC AB 4-0 P2 18 (SUTURE) IMPLANT
SUT VIC AB 4-0 P3 18 (SUTURE)
SUT VICRYL 4-0 PS2 18IN ABS (SUTURE) ×4 IMPLANT
SUTURE FIBERWR 2-0 18 17.9 3/8 (SUTURE) IMPLANT
SUTURE FIBERWR 4-0 18 DIA BLUE (SUTURE) IMPLANT
SUTURE FIBERWR 4-0 18 TAPR NDL (SUTURE) IMPLANT
SYR BULB 3OZ (MISCELLANEOUS) ×4 IMPLANT
SYR CONTROL 10ML LL (SYRINGE) IMPLANT
TOWEL GREEN STERILE FF (TOWEL DISPOSABLE) ×8 IMPLANT
TUBE FEEDING ENTERAL 5FR 16IN (TUBING) IMPLANT
UNDERPAD 30X36 HEAVY ABSORB (UNDERPADS AND DIAPERS) ×4 IMPLANT

## 2018-11-26 NOTE — Progress Notes (Signed)
Assisted Dr. Turk with right, ultrasound guided, axillary block. Side rails up, monitors on throughout procedure. See vital signs in flow sheet. Tolerated Procedure well. 

## 2018-11-26 NOTE — Anesthesia Procedure Notes (Signed)
Anesthesia Regional Block: Axillary brachial plexus block   Pre-Anesthetic Checklist: ,, timeout performed, Correct Patient, Correct Site, Correct Laterality, Correct Procedure, Correct Position, site marked, Risks and benefits discussed,  Surgical consent,  Pre-op evaluation,  At surgeon's request and post-op pain management  Laterality: Right  Prep: chloraprep       Needles:  Injection technique: Single-shot  Needle Type: Echogenic Needle     Needle Length: 9cm  Needle Gauge: 21     Additional Needles:   Procedures:,,,, ultrasound used (permanent image in chart),,,,  Narrative:  Start time: 11/26/2018 8:48 AM End time: 11/26/2018 8:58 AM Injection made incrementally with aspirations every 5 mL.  Performed by: Personally  Anesthesiologist: Catalina Gravel, MD  Additional Notes: No pain on injection. No increased resistance to injection. Injection made in 5cc increments.  Good needle visualization.  Patient tolerated procedure well.

## 2018-11-26 NOTE — Op Note (Signed)
NAME: Kara [name] MEDICAL RECORD NO: [id number] DATE OF BIRTH: 09-27-1955 FACILITY: [facility] LOCATION: [facility] PHYSICIAN: Wynonia Sours, MD   OPERATIVE REPORT   DATE OF PROCEDURE: 11/26/18    PREOPERATIVE DIAGNOSIS:   Carpometacarpal arthritis right thumb   POSTOPERATIVE DIAGNOSIS:   Same   PROCEDURE:   Suspension plasty with micro link, tendon transfer, trapezial excision right thumb   SURGEON: Daryll Brod, M.D.   ASSISTANT: Leverne Humbles, [facility]   ANESTHESIA:  Regional with sedation   INTRAVENOUS FLUIDS:  Per anesthesia flow sheet.   ESTIMATED BLOOD LOSS:  Minimal.   COMPLICATIONS:  None.   SPECIMENS:  none   TOURNIQUET TIME:   * Missing tourniquet times found for documented tourniquets in log: [id number] *   DISPOSITION:  Stable to PACU.   INDICATIONS: Patient is a 63 year old female with a history of basilar joint arthritis right thumb.  This has not responded to conservative treatment.  She has elected to undergo suspension plasty with micro link support fine by trapezial excision flexor carpi radialis tendon transfer.  She is aware of risks and complications including infection recurrence injury to arteries nerves tendons incomplete relief symptoms dystrophy.  Pre-peri-and postoperative course been discussed.  Preoperative area the patient is seen extremity marked by both patient and surgeon antibiotic given a supraclavicular block was carried out without difficulty under the direction the anesthesia department.  OPERATIVE COURSE: She was brought to the operating room where she was placed in supine position with the right arm free.  She was prepped using ChloraPrep.  A three-minute dry time was allowed and a timeout taken to confirm patient procedure.  The limb was exsanguinated with an Esmarch bandage turn placed on the arm was inflated to 250 mmHg.  A longitudinal incision was made over the first dorsal extensor compartment over the snuffbox distally.  This  carried down through subcutaneous tissue.  Bleeders were electrocauterized with bipolar.  The radial nerve was identified and protected.  The radial artery was dissected proximally.  A large loose body was then removed from the [facility] joint.  A hole incision was made along the trapezium.  The STT joint and [facility] joint were isolated.  Periosteum was then removed from the trapezium.  This was done with  blunt and sharp dissection.  Care was taken to protect the radial artery throughout the procedure.  The trapezium was then removed removed in piecemeal.  There is significant arthritis of both joints.  There was no cartilage present on the distal scaphoid.  No cartilage on the trapezium or proximal metacarpal.  The flexor carpi radialis tendon was then developed pulled from the wound with a right angled retractor one half was then harvested and left attached distally to the base of the index metacarpal the dissection split the tendon that was left intact one half.  A micro link suture guide was then passed through the base of the thumb at the insertion of the abductor pollicis longus.  This was directed into the base of the index metacarpal and driven through.  This was confirmed with image intensification both AP lateral and oblique x-rays.  An incision was made at the egress of the guidepin on the dorsal ulnar aspect of the second metacarpal.  This was dissected down to the bone.  The micro link suture was then passed through.  A right angle hemostat was placed between the base of the index and thumb metacarpal and the micro link suture was then tied.  This firmly held the thumb metacarpal from the index metacarpal.  X-rays AP lateral and Su Hilt view confirm the positioning of the suspension.  The wound was copiously irrigated with saline.  The flexor carpi radialis one half of the tendon which was harvested was then passed through the abductor pollicis longus tendon.  This was sutured into position with 3-0 Ethibond  sutures.  It was doubled back on itself and sutured to the flexor carpi radialis as it egressed from the base of the index metacarpal and sutured into position with the Ethibond suture.  The capsule was closed with interrupted 4-0 Vicryl sutures as much as possible.  The subcutaneous tissue was closed with interrupted 4-0 Vicryl and skin with interrupted 4-0 nylon sutures.  A sterile compressive dressing thumb spica splint was applied.  With deflation of the tourniquet all fingers and thumb immediately pink.  She was taken to the recovery room in satisfactory condition.  She will be discharged home to return to the [facility] [place] [facility] in 1 week on Percocet.  She had been prescribed Norco by her pain management physician however my experience is that Norco will not be adequate for pain control on this particular operation.   Cindee Salt, MD Electronically signed, 11/26/18

## 2018-11-26 NOTE — Transfer of Care (Signed)
Immediate Anesthesia Transfer of Care Note  Patient: Kara [name]  Procedure(s) Performed: TRAPEZIUM EXCISION RIGHT THUMB (Right Hand) TENDON TRANSFER (Right Hand) MICROLINK SUSPENSION (Right Hand)  Patient Location: PACU  Anesthesia Type:MAC combined with regional for post-op pain  Level of Consciousness: awake, alert , oriented and drowsy  Airway & Oxygen Therapy: Patient Spontanous Breathing and Patient connected to face mask oxygen  Post-op Assessment: Report given to RN and Post -op Vital signs reviewed and stable  Post vital signs: Reviewed and stable  Last Vitals:  Vitals Value Taken Time  BP    Temp    Pulse 73 11/26/18 1131  Resp 24 11/26/18 1131  SpO2 94 % 11/26/18 1131  Vitals shown include unvalidated device data.  Last Pain:  Vitals:   11/26/18 0823  TempSrc: Oral  PainSc: 10-Worst pain ever         Complications: No apparent anesthesia complications

## 2018-11-26 NOTE — Anesthesia Preprocedure Evaluation (Signed)
Anesthesia Evaluation  Patient identified by MRN, date of birth, ID band Patient awake    Reviewed: Allergy & Precautions, NPO status , Patient's Chart, lab work & pertinent test results  Airway Mallampati: II  TM Distance: >3 FB Neck ROM: Full    Dental  (+) Teeth Intact, Dental Advisory Given   Pulmonary asthma , former smoker,    Pulmonary exam normal breath sounds clear to auscultation       Cardiovascular hypertension, Pt. on medications Normal cardiovascular exam Rhythm:Regular Rate:Normal     Neuro/Psych PSYCHIATRIC DISORDERS Anxiety Depression negative neurological ROS     GI/Hepatic negative GI ROS, (+)     substance abuse  marijuana use,   Endo/Other  negative endocrine ROS  Renal/GU negative Renal ROS     Musculoskeletal  (+) Arthritis , Rheumatoid disorders,  Fibromyalgia -, narcotic dependentCARPOMETACARPAL ARTHRITIS RIGHT THUMB FUSION INTERPHALANGEAL JOINT   Abdominal   Peds  Hematology negative hematology ROS (+)   Anesthesia Other Findings Day of surgery medications reviewed with the patient.  Reproductive/Obstetrics                             Anesthesia Physical Anesthesia Plan  ASA: III  Anesthesia Plan: Regional and MAC   Post-op Pain Management:  Regional for Post-op pain   Induction: Intravenous  PONV Risk Score and Plan: 2 and Midazolam, Propofol infusion and Treatment may vary due to age or medical condition  Airway Management Planned: Nasal Cannula and Natural Airway  Additional Equipment:   Intra-op Plan:   Post-operative Plan:   Informed Consent: I have reviewed the patients History and Physical, chart, labs and discussed the procedure including the risks, benefits and alternatives for the proposed anesthesia with the patient or authorized representative who has indicated his/her understanding and acceptance.     Dental advisory given  Plan  Discussed with:   Anesthesia Plan Comments:         Anesthesia Quick Evaluation

## 2018-11-26 NOTE — Op Note (Signed)
Radiology note:  Multiple views of the positioning of the micro link suture pre-and post application were taken feeling the thumb well suspended and the micro leg suture in good position.

## 2018-11-26 NOTE — Discharge Instructions (Addendum)

## 2018-11-26 NOTE — H&P (Signed)
Coralynn Gaona is an 63 y.o. female.   Chief Complaint: right thumb pain HPI: Niamya  has bilateral CMC arthritis and arthritis of her fingers and IP joint of her thumb. She is referred by Dr. Dion Saucier. She has had shoulder surgery and 5 other surgeries since last being seen mostly on her back. He is complaining of severe pain in the carpometacarpal joints of each hand. Complaining of pain at the IP joint on her right side. Is moderate to severe in nature. She has recently had an injection to the carpometacarpal joint of the left thumb. She has had splints. She is taking meloxicam. There is a family history of rheumatoid arthritis in her mother. There is no family history of diabetes thyroid problems or gout. Does have arthritis. She states that her pain is severe in nature and she is desirous of having something done to correct it. Is not complaining of any numbness or tingling.   Past Medical History:  Diagnosis Date  . ADD (attention deficit disorder)    on Adderal  . ADD (attention deficit disorder)   . Aggressive behavior of adult   . Anemia   . Anxiety   . Cellulitis of breast 11/2013   RIGHT BREAST  . Childhood asthma    as child  . 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 Chronic lower back pain   . Chronic pain    went to [facility] for pain control; stopped in 2016 " (02/23/2017)  . Cold sore   . 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 Degenerative disc disease, lumbar   . Depression    takes meds daily  . Family history of malignant neoplasm of breast   . Fibromyalgia   . Fibromyalgia   . H/O degenerative disc disease   . History of kidney stones   . Hypertension   . Osteoporosis   . Osteoporosis   . Other specified rheumatoid arthritis, right shoulder (HCC) 08/01/2011  . Pneumonia 2010?  [name] Post-nasal drip    hx of  . Pre-diabetes   . PTSD (post-traumatic stress disorder)   . RA (rheumatoid arthritis)  (HCC)    autoimmune arthritis  . RA (rheumatoid arthritis) (HCC)   . Spondylitis (HCC)   . Suicidal intent     Past Surgical History:  Procedure Laterality Date  . ANTERIOR CERVICAL DECOMP/DISCECTOMY FUSION N/A 03/15/2015   Procedure: Cervical five-six, Cerival six-seven, Anterior Cervical Discectomy and Fusion, Allograft and Plate;  Surgeon: Eldred Manges, MD;  Location: [facility] OR;  Service: Orthopedics;  Laterality: N/A;  . ANTERIOR LAT LUMBAR FUSION Left 06/11/2018   Procedure: Left Lumbar two-three Anterolateral decompression/fusion/lateral plate fixation;  Surgeon: Barnett Abu, MD;  Location: [facility] OR;  Service: Neurosurgery;  Laterality: Left;  Left Lumbar two-three Anterolateral decompression/fusion/lateral plate fixation  . AUGMENTATION MAMMAPLASTY  2003  . BACK SURGERY    . BLADDER SUSPENSION  2009  . BREAST IMPLANT REMOVAL Bilateral 10/2013  . CERVICAL WOUND DEBRIDEMENT N/A 07/07/2016   Procedure: IRRIGATION AND DEBRIDEMENT POSTERIOR NECK;  Surgeon: Eldred Manges, MD;  Location: [facility] OR;  Service: Orthopedics;  Laterality: N/A;  . COLONOSCOPY    . COMBINED ABDOMINOPLASTY AND LIPOSUCTION  2003  . INCISION AND DRAINAGE ABSCESS Right 01/16/2014   Procedure: INCISION AND DRAINAGE AND OF RIGHT BREAST ABCESS;  Surgeon: Glenna Fellows, MD;  Location: [facility];  Service: General;  Laterality: Right;  . JOINT REPLACEMENT    .  LUMBAR LAMINECTOMY/DECOMPRESSION MICRODISCECTOMY N/A 12/10/2015   Procedure: Right L3-4 Hemilaminectomy, Excision of herniated nucleus pulposus;  Surgeon: Eldred MangesMark C Yates, MD;  [facility];  Service: Orthopedics;  Laterality: N/A;  . MASS EXCISION  11/03/2011   Procedure: MINOR EXCISION OF MASS;  Surgeon: Wyn Forsterobert V Sypher Jr., MD;  Location:  SURGERY CENTER;  Service: Orthopedics;  Laterality: Left;  debride IP joint, cyst excision left index  . MAXIMUM ACCESS (MAS) TRANSFORAMINAL LUMBAR INTERBODY FUSION (TLIF) 2 LEVEL Right 02/23/2017  . POSTERIOR CERVICAL  FUSION/FORAMINOTOMY N/A 06/21/2016   Procedure: POSTERIOR CERVICAL FUSION C5-C7 SPINOUS PROCESS WIRING;  Surgeon: Eldred MangesYates, Mark C, MD;  Location: [facility] OR;  Service: Orthopedics;  Laterality: N/A;  . POSTERIOR LUMBAR FUSION  07/2009; 07/03/2014   "L4-5; L5-S1"  . SHOULDER ARTHROSCOPY Right 2012  . TONSILLECTOMY  1987  . TOTAL SHOULDER ARTHROPLASTY  08/01/2011   Procedure: TOTAL SHOULDER ARTHROPLASTY;  Surgeon: Eulas PostJoshua P Landau, MD;  Location: [facility] OR;  Service: Orthopedics;  Laterality: Right;  Right total shoulder arthroplasty  . TUBAL LIGATION  1988    Family History  Problem Relation Age of Onset  . Arthritis Mother   . Heart disease Mother        ?psvt  . Breast cancer Mother [id number]       TAH/BSO  . Cancer Mother   . Mental illness Mother   . COPD Father   . Hypertension Father   . Alcohol abuse Father   . Mental illness Father   . Heart disease Father   . Healthy Daughter   . Breast cancer Maternal Aunt 27       deceased  . Cancer Cousin [id number]       female; unknown primary  . Colon cancer Paternal Aunt [id number]       deceased at 5067  . Stomach cancer Paternal Uncle [id number]       deceased at 4492  . Alcohol abuse Brother   . Cancer Brother   . Hodgkin's lymphoma Brother   . Mental illness Brother   . HIV Brother   . Healthy Brother   . Healthy Son    Social History:  reports that she quit smoking about 12 years ago. Her smoking use included cigarettes. She has a 10.00 pack-year smoking history. She has never used smokeless tobacco. She reports current alcohol use. She reports current drug use. Drug: Marijuana.  Allergies:  Allergies  Allergen Reactions  . Aspirin Swelling    Throat swells  . Bee Venom Anaphylaxis  . Ibuprofen Swelling    Throat swells  . Ativan [Lorazepam] Other (See Comments)    Agitation/confusion   . Ketamine Other (See Comments)    Hallucinations  . Toradol [Ketorolac Tromethamine] Itching, Swelling and Rash  . Tramadol Hcl Itching, Swelling and Rash    Tolerates  Dilaudid 06/2016.  TDD.    No medications prior to admission.    No results found for this or any previous visit (from the past 48 hour(s)).  No results found.   Pertinent items are noted in HPI.  Height 5\' 4"  (1.626 m), weight 65.8 kg.  General appearance: alert, cooperative and appears stated age Head: Normocephalic, without obvious abnormality Neck: no JVD Resp: clear to auscultation bilaterally Cardio: regular rate and rhythm, S1, S2 normal, no murmur, click, rub or gallop GI: soft, non-tender; bowel sounds normal; no masses,  no organomegaly Extremities:  right thumb pain Pulses: 2+ and symmetric Skin: Skin color, texture, turgor normal. No rashes or  lesions Neurologic: Grossly normal Incision/Wound: na  Assessment/Plan  Assessment:  1. Primary osteoarthritis of both first carpometacarpal joints  2. Osteoarthritis of finger of right hand  3. Primary osteoarthritis of both hands    Plan: We have discussed possible surgical intervention with her pre-peri-postoperative course are discussed along with risks and complications. In light of the recent injection to the left side would recommend doing a right side first. This will require a suspension plasty with trapezium excision tendon transfer micro link suspension along  of her thumb as an outpatient under regional anesthesia. She is her encouraged and answered to her satisfaction. Pre-peri-and postoperative course been discussed along with risk complications including infection recurrence injury to arteries nerves tendons complete relief symptoms and dystrophy.   Daryll Brod 11/26/2018, 5:50 AM

## 2018-11-26 NOTE — Brief Op Note (Signed)
11/26/2018  11:10 AM  PATIENT:  Kara [name]  63 y.o. female  PRE-OPERATIVE DIAGNOSIS:  CARPOMETACARPAL ARTHRITIS RIGHT THUMB FUSION INTERPHALANGEAL JOINT  POST-OPERATIVE DIAGNOSIS:  CARPOMETACARPAL ARTHRITIS RIGHT THUMB   PROCEDURE:  Procedure(s) with comments: TRAPEZIUM EXCISION RIGHT THUMB (Right) - AXILLARY BLOCK TENDON TRANSFER (Right) MICROLINK SUSPENSION (Right)  SURGEON:  Surgeon(s) and Role:    Daryll Brod, MD - Primary  PHYSICIAN ASSISTANT:   ASSISTANTS: R Dasnoit,PAC   ANESTHESIA:   regional and IV sedation  EBL: 32ml  BLOOD ADMINISTERED:none  DRAINS: none   LOCAL MEDICATIONS USED:  NONE  SPECIMEN:  No Specimen  DISPOSITION OF SPECIMEN:  N/A  COUNTS:  YES  TOURNIQUET:  * Missing tourniquet times found for documented tourniquets in log: [id number] *  DICTATION: .Dragon Dictation  PLAN OF CARE: Discharge to home after PACU  PATIENT DISPOSITION:  PACU - hemodynamically stable.

## 2018-11-27 ENCOUNTER — Encounter (HOSPITAL_BASED_OUTPATIENT_CLINIC_OR_DEPARTMENT_OTHER): Payer: Self-pay | Admitting: Orthopedic Surgery

## 2018-11-28 ENCOUNTER — Encounter (HOSPITAL_BASED_OUTPATIENT_CLINIC_OR_DEPARTMENT_OTHER): Payer: Self-pay | Admitting: Orthopedic Surgery

## 2018-11-28 NOTE — Anesthesia Postprocedure Evaluation (Signed)
Anesthesia Post Note  Patient: Kara [name]  Procedure(s) Performed: TRAPEZIUM EXCISION RIGHT THUMB (Right Hand) TENDON TRANSFER (Right Hand) MICROLINK SUSPENSION (Right Hand)     Patient location during evaluation: PACU Anesthesia Type: Regional Level of consciousness: awake and alert Pain management: pain level controlled Vital Signs Assessment: post-procedure vital signs reviewed and stable Respiratory status: spontaneous breathing, nonlabored ventilation, respiratory function stable and patient connected to nasal cannula oxygen Cardiovascular status: stable and blood pressure returned to baseline Postop Assessment: no apparent nausea or vomiting Anesthetic complications: no    Last Vitals:  Vitals:   11/26/18 1150 11/26/18 1200  BP: (!) 145/76 118/77  Pulse: 64 71  Resp: 16 18  Temp:  36.9 C  SpO2: 95% 96%    Last Pain:  Vitals:   11/27/18 0932  TempSrc:   PainSc: 8                  Catalina Gravel

## 2018-12-13 NOTE — Patient Instructions (Addendum)
DUE TO COVID-19 ONLY ONE VISITOR IS ALLOWED TO COME WITH YOU AND STAY IN THE WAITING ROOM ONLY DURING PRE OP AND PROCEDURE DAY OF SURGERY. THE 1 VISITOR MAY VISIT WITH YOU AFTER SURGERY IN YOUR PRIVATE ROOM DURING VISITING HOURS ONLY!  YOU NEED TO HAVE A COVID 19 TEST ON_Friday 09/25/2020_____ @__1250  am_____, THIS TEST MUST BE DONE BEFORE SURGERY, COME  [address], [place] , [address].  ([place]) ONCE YOUR COVID TEST IS COMPLETED, PLEASE BEGIN THE QUARANTINE INSTRUCTIONS AS OUTLINED IN YOUR HANDOUT.                Kara [name]  12/13/2018   Your procedure is scheduled on: Tuesday 12/24/2018   Report to [facility] Main  Entrance              Report to Short Stay at  [place]  AM     Call this number if you have problems the morning of surgery [phone]    Remember: Do not eat food  :After Midnight.               NO SOLID FOOD AFTER MIDNIGHT THE NIGHT PRIOR TO SURGERY. NOTHING BY MOUTH EXCEPT CLEAR LIQUIDS UNTIL 0430 am .                PLEASE FINISH  Gatorade G2  DRINK PER SURGEON ORDER  WHICH NEEDS TO BE COMPLETED AT 0430 am .   CLEAR LIQUID DIET   Foods Allowed                                                                     Foods Excluded  Coffee and tea, regular and decaf                             liquids that you cannot  Plain Jell-O any favor except red or purple                                           see through such as: Fruit ices (not with fruit pulp)                                     milk, soups, orange juice  Iced Popsicles                                    All solid food Carbonated beverages, regular and diet                                    Cranberry, grape and apple juices Sports drinks like Gatorade Lightly seasoned clear broth or consume(fat free) Sugar, honey syrup  Sample Menu Breakfast  Lunch                                     Supper Cranberry juice                    Beef  broth                            Chicken broth Jell-O                                     Grape juice                           Apple juice Coffee or tea                        Jell-O                                      Popsicle                                                Coffee or tea                        Coffee or tea  _____________________________________________________________________                BRUSH YOUR TEETH MORNING OF SURGERY AND RINSE YOUR MOUTH OUT, NO CHEWING GUM CANDY OR MINTS.     Take these medicines the morning of surgery with A SIP OF WATER: Venlafaxine (Effexor-XR)                                 You may not have any metal on your body including hair pins and              piercings  Do not wear jewelry, make-up, lotions, powders or perfumes, deodorant             Do not wear nail polish.  Do not shave  48 hours prior to surgery.             Do not bring valuables to the hospital. [facility] IS NOT             RESPONSIBLE   FOR VALUABLES.  Contacts, dentures or bridgework may not be worn into surgery.  Leave suitcase in the car. After surgery it may be brought to your room.                  Please read over the following fact sheets you were given: _____________________________________________________________________             [facility] - Preparing for Surgery Before surgery, you can play an important role.  Because skin is not sterile, your skin needs to be as free of germs as possible.  You can reduce the number of germs on your skin by washing with CHG (  chlorahexidine gluconate) soap before surgery.  CHG is an antiseptic cleaner which kills germs and bonds with the skin to continue killing germs even after washing. Please DO NOT use if you have an allergy to CHG or antibacterial soaps.  If your skin becomes reddened/irritated stop using the CHG and inform your nurse when you arrive at Short Stay. Do not shave (including legs and underarms) for  at least 48 hours prior to the first CHG shower.  You may shave your face/neck. Please follow these instructions carefully:  1.  Shower with CHG Soap the night before surgery and the  morning of Surgery.  2.  If you choose to wash your hair, wash your hair first as usual with your  normal  shampoo.  3.  After you shampoo, rinse your hair and body thoroughly to remove the  shampoo.                           4.  Use CHG as you would any other liquid soap.  You can apply chg directly  to the skin and wash                       Gently with a scrungie or clean washcloth.  5.  Apply the CHG Soap to your body ONLY FROM THE NECK DOWN.   Do not use on face/ open                           Wound or open sores. Avoid contact with eyes, ears mouth and genitals (private parts).                       Wash face,  Genitals (private parts) with your normal soap.             6.  Wash thoroughly, paying special attention to the area where your surgery  will be performed.  7.  Thoroughly rinse your body with warm water from the neck down.  8.  DO NOT shower/wash with your normal soap after using and rinsing off  the CHG Soap.                9.  Pat yourself dry with a clean towel.            10.  Wear clean pajamas.            11.  Place clean sheets on your bed the night of your first shower and do not  sleep with pets. Day of Surgery : Do not apply any lotions/deodorants the morning of surgery.  Please wear clean clothes to the hospital/surgery center.  FAILURE TO FOLLOW THESE INSTRUCTIONS MAY RESULT IN THE CANCELLATION OF YOUR SURGERY PATIENT SIGNATURE_________________________________  NURSE SIGNATURE__________________________________  ________________________________________________________________________   Kara MireIncentive Spirometer  An incentive spirometer is a tool that can help keep your lungs clear and active. This tool measures how well you are filling your lungs with each breath. Taking long deep  breaths may help reverse or decrease the chance of developing breathing (pulmonary) problems (especially infection) following:  A long period of time when you are unable to move or be active. BEFORE THE PROCEDURE   If the spirometer includes an indicator to show your best effort, your nurse or respiratory therapist will set it to a desired goal.  If possible, sit up straight or lean  slightly forward. Try not to slouch.  Hold the incentive spirometer in an upright position. INSTRUCTIONS FOR USE  1. Sit on the edge of your bed if possible, or sit up as far as you can in bed or on a chair. 2. Hold the incentive spirometer in an upright position. 3. Breathe out normally. 4. Place the mouthpiece in your mouth and seal your lips tightly around it. 5. Breathe in slowly and as deeply as possible, raising the piston or the ball toward the top of the column. 6. Hold your breath for 3-5 seconds or for as long as possible. Allow the piston or ball to fall to the bottom of the column. 7. Remove the mouthpiece from your mouth and breathe out normally. 8. Rest for a few seconds and repeat Steps 1 through 7 at least 10 times every 1-2 hours when you are awake. Take your time and take a few normal breaths between deep breaths. 9. The spirometer may include an indicator to show your best effort. Use the indicator as a goal to work toward during each repetition. 10. After each set of 10 deep breaths, practice coughing to be sure your lungs are clear. If you have an incision (the cut made at the time of surgery), support your incision when coughing by placing a pillow or rolled up towels firmly against it. Once you are able to get out of bed, walk around indoors and cough well. You may stop using the incentive spirometer when instructed by your caregiver.  RISKS AND COMPLICATIONS  Take your time so you do not get dizzy or light-headed.  If you are in pain, you may need to take or ask for pain medication before  doing incentive spirometry. It is harder to take a deep breath if you are having pain. AFTER USE  Rest and breathe slowly and easily.  It can be helpful to keep track of a log of your progress. Your caregiver can provide you with a simple table to help with this. If you are using the spirometer at home, follow these instructions: SEEK MEDICAL CARE IF:   You are having difficultly using the spirometer.  You have trouble using the spirometer as often as instructed.  Your pain medication is not giving enough relief while using the spirometer.  You develop fever of 100.5 F (38.1 C) or higher. SEEK IMMEDIATE MEDICAL CARE IF:   You cough up bloody sputum that had not been present before.  You develop fever of 102 F (38.9 C) or greater.  You develop worsening pain at or near the incision site. MAKE SURE YOU:   Understand these instructions.  Will watch your condition.  Will get help right away if you are not doing well or get worse. Document Released: 07/24/2006 Document Revised: 06/05/2011 Document Reviewed: 09/24/2006 [facility] Patient Information 2014 [place], [place].   ________________________________________________________________________

## 2018-12-16 ENCOUNTER — Encounter (HOSPITAL_COMMUNITY): Payer: Self-pay

## 2018-12-16 ENCOUNTER — Emergency Department (HOSPITAL_COMMUNITY)
Admission: EM | Admit: 2018-12-16 | Discharge: 2018-12-17 | Disposition: A | Discharge status: Other Acute Inpatient Hospital | Payer: Medicare Other | Attending: Emergency Medicine | Admitting: Emergency Medicine

## 2018-12-16 ENCOUNTER — Other Ambulatory Visit: Payer: Self-pay

## 2018-12-16 ENCOUNTER — Encounter (HOSPITAL_COMMUNITY)
Admission: RE | Admit: 2018-12-16 | Discharge: 2018-12-16 | Disposition: A | Discharge status: Home / Self Care | Payer: 59 | Source: Ambulatory Visit | Attending: Orthopedic Surgery | Admitting: Orthopedic Surgery

## 2018-12-16 ENCOUNTER — Encounter (HOSPITAL_COMMUNITY): Payer: Self-pay | Admitting: Emergency Medicine

## 2018-12-16 DIAGNOSIS — Z01812 Encounter for preprocedural laboratory examination: Secondary | ICD-10-CM | POA: Insufficient documentation

## 2018-12-16 DIAGNOSIS — M1990 Unspecified osteoarthritis, unspecified site: Secondary | ICD-10-CM | POA: Insufficient documentation

## 2018-12-16 DIAGNOSIS — F319 Bipolar disorder, unspecified: Secondary | ICD-10-CM | POA: Insufficient documentation

## 2018-12-16 DIAGNOSIS — R45851 Suicidal ideations: Secondary | ICD-10-CM | POA: Insufficient documentation

## 2018-12-16 DIAGNOSIS — I10 Essential (primary) hypertension: Secondary | ICD-10-CM | POA: Insufficient documentation

## 2018-12-16 DIAGNOSIS — Z885 Allergy status to narcotic agent status: Secondary | ICD-10-CM | POA: Insufficient documentation

## 2018-12-16 DIAGNOSIS — Z87891 Personal history of nicotine dependence: Secondary | ICD-10-CM | POA: Insufficient documentation

## 2018-12-16 DIAGNOSIS — Z79899 Other long term (current) drug therapy: Secondary | ICD-10-CM | POA: Insufficient documentation

## 2018-12-16 DIAGNOSIS — F332 Major depressive disorder, recurrent severe without psychotic features: Secondary | ICD-10-CM | POA: Diagnosis present

## 2018-12-16 DIAGNOSIS — T1491XA Suicide attempt, initial encounter: Secondary | ICD-10-CM

## 2018-12-16 DIAGNOSIS — Z91030 Bee allergy status: Secondary | ICD-10-CM | POA: Insufficient documentation

## 2018-12-16 DIAGNOSIS — Z886 Allergy status to analgesic agent status: Secondary | ICD-10-CM | POA: Insufficient documentation

## 2018-12-16 DIAGNOSIS — M797 Fibromyalgia: Secondary | ICD-10-CM | POA: Insufficient documentation

## 2018-12-16 DIAGNOSIS — Z20828 Contact with and (suspected) exposure to other viral communicable diseases: Secondary | ICD-10-CM | POA: Insufficient documentation

## 2018-12-16 DIAGNOSIS — M069 Rheumatoid arthritis, unspecified: Secondary | ICD-10-CM | POA: Insufficient documentation

## 2018-12-16 DIAGNOSIS — Z888 Allergy status to other drugs, medicaments and biological substances status: Secondary | ICD-10-CM | POA: Insufficient documentation

## 2018-12-16 DIAGNOSIS — F603 Borderline personality disorder: Secondary | ICD-10-CM | POA: Insufficient documentation

## 2018-12-16 DIAGNOSIS — T424X2A Poisoning by benzodiazepines, intentional self-harm, initial encounter: Secondary | ICD-10-CM

## 2018-12-16 HISTORY — DX: Other complications of anesthesia, initial encounter: T88.59XA

## 2018-12-16 LAB — CBG MONITORING, ED: Glucose-Capillary: 139 mg/dL — ABNORMAL HIGH (ref 70–99)

## 2018-12-16 LAB — CBC
HCT: 45.1 % (ref 36.0–46.0)
HCT: 44.7 % (ref 36.0–46.0)
Hemoglobin: 14.2 g/dL (ref 12.0–15.0)
Hemoglobin: 14.2 g/dL (ref 12.0–15.0)
MCH: 27.3 pg (ref 26.0–34.0)
MCH: 27.2 pg (ref 26.0–34.0)
MCHC: 31.5 g/dL (ref 30.0–36.0)
MCHC: 31.8 g/dL (ref 30.0–36.0)
MCV: 86.6 fL (ref 80.0–100.0)
MCV: 85.6 fL (ref 80.0–100.0)
Platelets: 491 10*3/uL — ABNORMAL HIGH (ref 150–400)
Platelets: 468 10*3/uL — ABNORMAL HIGH (ref 150–400)
RBC: 5.21 MIL/uL — ABNORMAL HIGH (ref 3.87–5.11)
RBC: 5.22 MIL/uL — ABNORMAL HIGH (ref 3.87–5.11)
RDW: 13 % (ref 11.5–15.5)
RDW: 13.1 % (ref 11.5–15.5)
WBC: 9.8 10*3/uL (ref 4.0–10.5)
WBC: 8.1 10*3/uL (ref 4.0–10.5)
nRBC: 0 % (ref 0.0–0.2)
nRBC: 0 % (ref 0.0–0.2)

## 2018-12-16 LAB — COMPREHENSIVE METABOLIC PANEL
ALT: 15 U/L (ref 0–44)
AST: 21 U/L (ref 15–41)
Albumin: 4.8 g/dL (ref 3.5–5.0)
Alkaline Phosphatase: 99 U/L (ref 38–126)
Anion gap: 12 (ref 5–15)
BUN: 26 mg/dL — ABNORMAL HIGH (ref 8–23)
CO2: 24 mmol/L (ref 22–32)
Calcium: 9.8 mg/dL (ref 8.9–10.3)
Chloride: 102 mmol/L (ref 98–111)
Creatinine, Ser: 0.87 mg/dL (ref 0.44–1.00)
GFR calc Af Amer: >60 mL/min (ref >60)
GFR calc non Af Amer: >60 mL/min (ref >60)
Glucose, Bld: 145 mg/dL — ABNORMAL HIGH (ref 70–99)
Potassium: 3.5 mmol/L (ref 3.5–5.1)
Sodium: 138 mmol/L (ref 135–145)
Total Bilirubin: 0.6 mg/dL (ref 0.3–1.2)
Total Protein: 8.5 g/dL — ABNORMAL HIGH (ref 6.5–8.1)

## 2018-12-16 LAB — SALICYLATE LEVEL: Salicylate Lvl: <7 mg/dL (ref 2.8–30.0)

## 2018-12-16 LAB — BASIC METABOLIC PANEL
Anion gap: 11 (ref 5–15)
BUN: 30 mg/dL — ABNORMAL HIGH (ref 8–23)
CO2: 24 mmol/L (ref 22–32)
Calcium: 10 mg/dL (ref 8.9–10.3)
Chloride: 104 mmol/L (ref 98–111)
Creatinine, Ser: 0.79 mg/dL (ref 0.44–1.00)
GFR calc Af Amer: >60 mL/min (ref >60)
GFR calc non Af Amer: >60 mL/min (ref >60)
Glucose, Bld: 105 mg/dL — ABNORMAL HIGH (ref 70–99)
Potassium: 4.3 mmol/L (ref 3.5–5.1)
Sodium: 139 mmol/L (ref 135–145)

## 2018-12-16 LAB — RAPID URINE DRUG SCREEN, HOSP PERFORMED
Amphetamines: POSITIVE — AB
Barbiturates: NOT DETECTED
Benzodiazepines: POSITIVE — AB
Cocaine: NOT DETECTED
Opiates: NOT DETECTED
Tetrahydrocannabinol: POSITIVE — AB

## 2018-12-16 LAB — ETHANOL: Alcohol, Ethyl (B): <10 mg/dL (ref <10)

## 2018-12-16 LAB — SURGICAL PCR SCREEN
MRSA, PCR: NEGATIVE
Staphylococcus aureus: NEGATIVE

## 2018-12-16 LAB — HEMOGLOBIN A1C
Hgb A1c MFr Bld: 5.8 % — ABNORMAL HIGH (ref 4.8–5.6)
Mean Plasma Glucose: 119.76 mg/dL

## 2018-12-16 LAB — ACETAMINOPHEN LEVEL: Acetaminophen (Tylenol), Serum: <10 ug/mL — ABNORMAL LOW (ref 10–30)

## 2018-12-16 MED ORDER — VENLAFAXINE HCL ER 75 MG PO CP24
75.0000 mg | ORAL_CAPSULE | Freq: Every day | ORAL | Status: DC
  Administered 2018-12-17: 75 mg via ORAL
  Filled 2018-12-16: qty 1

## 2018-12-16 MED ORDER — STERILE WATER FOR INJECTION IJ SOLN
INTRAMUSCULAR | Status: AC
  Administered 2018-12-16: 10 mL
  Filled 2018-12-16: qty 10

## 2018-12-16 MED ORDER — ONDANSETRON HCL 4 MG PO TABS: 4.0000 mg | ORAL_TABLET | Freq: Three times a day (TID) | ORAL | Status: DC | PRN

## 2018-12-16 MED ORDER — GABAPENTIN 300 MG PO CAPS
300.0000 mg | ORAL_CAPSULE | Freq: Three times a day (TID) | ORAL | Status: DC
  Administered 2018-12-17: 09:00:00 300 mg via ORAL
  Filled 2018-12-16: qty 1

## 2018-12-16 MED ORDER — LISINOPRIL 10 MG PO TABS
10.0000 mg | ORAL_TABLET | Freq: Every day | ORAL | Status: DC
  Administered 2018-12-17: 09:00:00 10 mg via ORAL
  Filled 2018-12-16: qty 1

## 2018-12-16 MED ORDER — LORAZEPAM 1 MG PO TABS: 1.0000 mg | ORAL_TABLET | ORAL | Status: DC | PRN

## 2018-12-16 MED ORDER — ACETAMINOPHEN 325 MG PO TABS: 650.0000 mg | ORAL_TABLET | ORAL | Status: DC | PRN

## 2018-12-16 MED ORDER — ZIPRASIDONE MESYLATE 20 MG IM SOLR
10.0000 mg | Freq: Once | INTRAMUSCULAR | Status: AC
  Administered 2018-12-16: 20:00:00 10 mg via INTRAMUSCULAR

## 2018-12-16 MED ORDER — ZIPRASIDONE MESYLATE 20 MG IM SOLR: 10.0000 mg | Freq: Once | INTRAMUSCULAR | Status: DC

## 2018-12-16 MED ORDER — CHARCOAL ACTIVATED PO LIQD
1.0000 g/kg | Freq: Once | ORAL | Status: DC
  Filled 2018-12-16: qty 480

## 2018-12-16 MED ORDER — ZIPRASIDONE MESYLATE 20 MG IM SOLR
20.0000 mg | Freq: Two times a day (BID) | INTRAMUSCULAR | Status: DC | PRN
  Administered 2018-12-17: 20 mg via INTRAMUSCULAR
  Filled 2018-12-16: qty 20

## 2018-12-16 MED ORDER — ZIPRASIDONE MESYLATE 20 MG IM SOLR
INTRAMUSCULAR | Status: AC
  Filled 2018-12-16: qty 20

## 2018-12-16 NOTE — Progress Notes (Addendum)
PCP - [facility]. [facility] Cardiologist - N/A  Chest x-ray - yes, within the year EKG - 11/04/2018 Stress Test -12/02/2015  ECHO - 12/03/2015 Cardiac Cath - N/A  Sleep Study -N/A  CPAP -   Fasting Blood Sugar - Patient states she is not Pre-Diabetic or have Diabetes  (History states patient is Pre-Diabetic) Checks Blood Sugar ___0__ times a day  Blood Thinner Instructions: Aspirin Instructions:N/ALast Dose:  Anesthesia review:   Chart given to Ward Givens to review.  Patient denies shortness of breath, fever, cough and chest pain at PAT appointment   Patient verbalized understanding of instructions that were given to them at the PAT appointment. Patient was also instructed that they will need to review over the PAT instructions again at home before surgery.

## 2018-12-16 NOTE — ED Notes (Signed)
TTS machine at bedside pt now medically cleared
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
## 2018-12-16 NOTE — ED Notes (Signed)
Call to Poison control called vital signs, labs, and ekg discussed pt is now cleared to be evaluated by psych

## 2018-12-16 NOTE — ED Notes (Signed)
Pt awake and yelling again. Pt yelling "fuck all of y'all" an screaming to "take this shit" off of her.

## 2018-12-16 NOTE — ED Notes (Signed)
Patient stated when she leaves she will try to commit suicide with carbon dioxide.
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
## 2018-12-16 NOTE — ED Triage Notes (Addendum)
Pt verbalizes took a total of 150 tablets of 1mg  xanax 20 minutes PTA in attempt to hurt self. Pt verbalizes losing house and husband/herself are moving in the mistress' houses and "I'd rather be dead." Pt states "if I leave here I will sit in the car with carbon monoxide."
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
## 2018-12-16 NOTE — ED Provider Notes (Signed)
[facility] DEPT Provider Note   CSN: [id number] Arrival date & time: 12/16/18  1714     History   Chief Complaint Chief Complaint  Patient presents with  . Drug Overdose  . Suicidal    HPI Kara [name] is a 63 y.o. female with a history of PTSD, depression, anxiety, presenting to the emergency department with intentional overdose.  She reports she took 150 tablets of 1 mg Xanax approximately 20 to 30 minutes prior to arrival in the ED.  She was driven to the ER by her husband.  She reports she feels depressed because her daughter is homeless and living on the street, her husband has been cheating on her, and they have moved in to the house with her husband's new partner.  She states she intentionally attempted to overdose.  She reports feeling a little bit dizzy but otherwise states that she has fine.  She denies taking any other medications including prescribed medications, over-the-counter medicines, or any street drugs.  She denies drinking any alcohol.  She denies cutting herself or attempting physical self-harm in any other way.  PDMP review reveals extensive list of prescriptions including 360 milligrams of Xanax prescribed approximately 1 week ago.  She also has prescriptions for Percocet and Ambien within the past year.     HPI  Past Medical History:  Diagnosis Date  . ADD (attention deficit disorder)    on Adderal  . ADD (attention deficit disorder)   . Aggressive behavior of adult   . Anemia   . Anxiety   . Cellulitis of breast 11/2013   RIGHT BREAST  . Childhood asthma    as child  . Chronic fatigue and immune dysfunction syndrome ([place])   . Chronic lower back pain   . Chronic pain    went to [facility] for pain control; stopped in 2016 " (02/23/2017)  . Cold sore   . Complication of anesthesia    Ketamine makes her hallucinate  . Confusion caused by a drug ([place])    methotrexate and autoimmune disease   . Degenerative  disc disease, lumbar   . Depression    takes meds daily  . Family history of malignant neoplasm of breast   . Fibromyalgia   . Fibromyalgia   . H/O degenerative disc disease   . History of kidney stones   . Hypertension   . Osteoporosis   . Osteoporosis   . Other specified rheumatoid arthritis, right shoulder ([place]) 08/01/2011  . Pneumonia 2010?  [name] Post-nasal drip    hx of  . Pre-diabetes    patient states she is not Pre-Diabetic at appt. 12/16/2018  . PTSD (post-traumatic stress disorder)   . RA (rheumatoid arthritis) (HCC)    autoimmune arthritis  . RA (rheumatoid arthritis) ([place])   . Spondylitis ([place])   . Suicidal intent     Patient Active Problem List   Diagnosis Date Noted  . Lumbar stenosis with neurogenic claudication 06/11/2018  . 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 ADD (attention deficit disorder) 09/02/2017  . 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 Sedative, hypnotic or anxiolytic use disorder, severe, dependence ([place]) 08/31/2017  . Cannabis use disorder, moderate, dependence ([place]) 08/31/2017  . Decreased libido 03/29/2017  . Lumbar stenosis 02/23/2017  . Spinal stenosis of lumbar region with neurogenic claudication 02/19/2017  . S/P lumbar spinal fusion 01/01/2017  . Essential hypertension 07/26/2016  . DNR (do  not resuscitate) discussion   . Palliative care by specialist   . Cervical pseudoarthrosis (HCC) 06/21/2016  . Opioid use disorder, moderate, dependence (HCC) 06/14/2016  . Mitral valve prolapse 01/04/2016  . HNP (herniated nucleus pulposus), lumbar 12/10/2015  . Prediabetes 08/19/2015  . Overweight (BMI 25.0-29.9) 08/18/2015  . Chronic pain 08/18/2015  . Vitamin D deficiency 08/18/2015  . Chronic fatigue disorder 06/14/2015  . S/P cervical spinal fusion 03/15/2015  . PTSD (post-traumatic stress disorder) 03/07/2014  . Suicide threat or attempt 03/06/2014   . Aggressive behavior   . Family history of malignant neoplasm of breast   . Rheumatoid arthritis (HCC) 12/13/2011  . Fibromyalgia 12/13/2011  . H/O cold sores 12/13/2011    Past Surgical History:  Procedure Laterality Date  . ANTERIOR CERVICAL DECOMP/DISCECTOMY FUSION N/A 03/15/2015   Procedure: Cervical five-six, Cerival six-seven, Anterior Cervical Discectomy and Fusion, Allograft and Plate;  Surgeon: Eldred Manges, MD;  Location: [facility] OR;  Service: Orthopedics;  Laterality: N/A;  . ANTERIOR LAT LUMBAR FUSION Left 06/11/2018   Procedure: Left Lumbar two-three Anterolateral decompression/fusion/lateral plate fixation;  Surgeon: Barnett Abu, MD;  Location: [facility] OR;  Service: Neurosurgery;  Laterality: Left;  Left Lumbar two-three Anterolateral decompression/fusion/lateral plate fixation  . AUGMENTATION MAMMAPLASTY  2003  . BACK SURGERY    . BLADDER SUSPENSION  2009  . BONE EXCISION Right 11/26/2018   Procedure: TRAPEZIUM EXCISION RIGHT THUMB;  Surgeon: Cindee Salt, MD;  Location: [facility];  Service: Orthopedics;  Laterality: Right;  AXILLARY BLOCK  . BREAST IMPLANT REMOVAL Bilateral 10/2013  . CARPOMETACARPEL SUSPENSION PLASTY Right 11/26/2018   Procedure: Carolynn Serve SUSPENSION;  Surgeon: Cindee Salt, MD;  Location: [facility];  Service: Orthopedics;  Laterality: Right;  . CERVICAL WOUND DEBRIDEMENT N/A 07/07/2016   Procedure: IRRIGATION AND DEBRIDEMENT POSTERIOR NECK;  Surgeon: Eldred Manges, MD;  Location: [facility] OR;  Service: Orthopedics;  Laterality: N/A;  . COLONOSCOPY    . COMBINED ABDOMINOPLASTY AND LIPOSUCTION  2003  . INCISION AND DRAINAGE ABSCESS Right 01/16/2014   Procedure: INCISION AND DRAINAGE AND OF RIGHT BREAST ABCESS;  Surgeon: Glenna Fellows, MD;  Location: [facility];  Service: General;  Laterality: Right;  . JOINT REPLACEMENT    . LUMBAR LAMINECTOMY/DECOMPRESSION MICRODISCECTOMY N/A 12/10/2015   Procedure: Right L3-4 Hemilaminectomy, Excision of  herniated nucleus pulposus;  Surgeon: Eldred Manges, MD;  Location: [facility];  Service: Orthopedics;  Laterality: N/A;  . MASS EXCISION  11/03/2011   Procedure: MINOR EXCISION OF MASS;  Surgeon: Wyn Forster., MD;  Location: [facility];  Service: Orthopedics;  Laterality: Left;  debride IP joint, cyst excision left index  . MAXIMUM ACCESS (MAS) TRANSFORAMINAL LUMBAR INTERBODY FUSION (TLIF) 2 LEVEL Right 02/23/2017  . POSTERIOR CERVICAL FUSION/FORAMINOTOMY N/A 06/21/2016   Procedure: POSTERIOR CERVICAL FUSION C5-C7 SPINOUS PROCESS WIRING;  Surgeon: Eldred Manges, MD;  Location: [facility];  Service: Orthopedics;  Laterality: N/A;  . POSTERIOR LUMBAR FUSION  07/2009; 07/03/2014   "L4-5; L5-S1"  . SHOULDER ARTHROSCOPY Right 2012  . TENDON TRANSFER Right 11/26/2018   Procedure: TENDON TRANSFER;  Surgeon: Cindee Salt, MD;  Location:  SURGERY CENTER;  Service: Orthopedics;  Laterality: Right;  . TONSILLECTOMY  1987  . TOTAL SHOULDER ARTHROPLASTY  08/01/2011   Procedure: TOTAL SHOULDER ARTHROPLASTY;  Surgeon: Eulas Post, MD;  Location: [facility] OR;  Service: Orthopedics;  Laterality: Right;  Right total shoulder arthroplasty  . TUBAL LIGATION  1988     OB History  No obstetric history on file.      Home Medications    Prior to Admission medications   Medication Sig Start Date End Date Taking? Authorizing Provider  ALPRAZolam (XANAX XR) 1 MG 24 hr tablet Take 1 mg by mouth 2 (two) times daily as needed for anxiety.    Yes [provider]  ALPRAZolam Prudy Feeler) 1 MG tablet Take 1 mg by mouth 4 (four) times daily as needed for anxiety.   Yes [provider]  amphetamine-dextroamphetamine (ADDERALL XR) 30 MG 24 hr capsule Take 60 mg by mouth every morning.    Yes [provider]  lisinopril (PRINIVIL,ZESTRIL) 10 MG tablet TAKE 1 AND 1/2 TABLETS DAILY BY MOUTH Patient taking differently: Take 10 mg by mouth daily.  03/07/18  Yes Kuneff, Renee A, DO  meloxicam  (MOBIC) 15 MG tablet TAKE 1 TABLET (15 MG) BY MOUTH EVERY DAY Patient taking differently: Take 15 mg by mouth daily.  07/06/17  Yes Kerrin Champagne, MD  oxyCODONE-acetaminophen (PERCOCET) 5-325 MG tablet Take 1 tablet by mouth every 4 (four) hours as needed for severe pain. 11/26/18 11/26/19 Yes Cindee Salt, MD  QUEtiapine (SEROQUEL) 50 MG tablet Take 50-100 mg by mouth at bedtime. 11/15/18  Yes [provider]  tretinoin (RETIN-A) 0.1 % cream Apply 1 application topically at bedtime as needed (facial blemishes).  01/28/18  Yes [provider]  venlafaxine XR (EFFEXOR-XR) 75 MG 24 hr capsule Take 75 mg by mouth daily with breakfast.   Yes [provider]  zolpidem (AMBIEN) 10 MG tablet Take 10 mg by mouth at bedtime.   Yes [provider]  alendronate (FOSAMAX) 35 MG tablet Take 1 tablet (35 mg total) by mouth every 7 (seven) days. Take with a full glass of water on an empty stomach. Patient not taking: Reported on 12/16/2018 04/04/18   Felix Pacini A, DO  gabapentin (NEURONTIN) 300 MG capsule Take 1 capsule by mouth 3 (three) times daily. 12/09/18   [provider]  methocarbamol (ROBAXIN) 500 MG tablet Take 1 tablet (500 mg total) by mouth 2 (two) times daily. Patient not taking: Reported on 12/16/2018 03/17/18   Elpidio Anis, PA-C  valACYclovir (VALTREX) 1000 MG tablet Take 2 tablets (2,000 mg total) by mouth 2 (two) times daily. Patient not taking: Reported on 12/16/2018 07/21/18   Ofilia Neas, PA-C    Family History Family History  Problem Relation Age of Onset  . Arthritis Mother   . Heart disease Mother        ?psvt  . Breast cancer Mother 25       TAH/BSO  . Cancer Mother   . Mental illness Mother   . COPD Father   . Hypertension Father   . Alcohol abuse Father   . Mental illness Father   . Heart disease Father   . Healthy Daughter   . Breast cancer Maternal Aunt 27       deceased  . Cancer Cousin 29       female; unknown primary  .  Colon cancer Paternal Aunt 73       deceased at 52  . Stomach cancer Paternal Uncle 8       deceased at 67  . Alcohol abuse Brother   . Cancer Brother   . Hodgkin's lymphoma Brother   . Mental illness Brother   . HIV Brother   . Healthy Brother   . Healthy Son     Social History Social History  Tobacco Use  . Smoking status: Former Smoker    Packs/day: 1.00    Years: 10.00    Pack years: 10.00    Types: Cigarettes    Quit date: 12/06/2005    Years since quitting: 13.0  . Smokeless tobacco: Never Used  Substance Use Topics  . Alcohol use: Yes    Comment: social  . Drug use: Yes    Types: Marijuana    Comment: smoked marjuana yesterday 12/15/2018     Allergies   Aspirin, Bee venom, Ibuprofen, Ativan [lorazepam], Ketamine, Toradol [ketorolac tromethamine], and Tramadol hcl   Review of Systems Review of Systems  Constitutional: Negative for chills and fever.  Cardiovascular: Negative for chest pain and palpitations.  Gastrointestinal: Negative for abdominal pain and vomiting.  Neurological: Positive for dizziness and light-headedness. Negative for seizures, syncope, numbness and headaches.  All other systems reviewed and are negative.    Physical Exam Updated Vital Signs BP 116/82 (BP Location: Right Arm)   Pulse 80   Temp 97.9 F (36.6 C) (Oral)   Resp 20   Ht 5\' 4"  (1.626 m)   Wt 66.7 kg   SpO2 95%   BMI 25.23 kg/m   Physical Exam Vitals signs and nursing note reviewed.  Constitutional:      General: She is not in acute distress.    Appearance: She is well-developed.     Comments: Mildly slurred speech  HENT:     Head: Normocephalic and atraumatic.  Eyes:     Conjunctiva/sclera: Conjunctivae normal.  Neck:     Musculoskeletal: Neck supple.  Cardiovascular:     Rate and Rhythm: Normal rate and regular rhythm.     Heart sounds: No murmur.  Pulmonary:     Effort: Pulmonary effort is normal. No respiratory distress.     Breath sounds: Normal  breath sounds.  Skin:    General: Skin is warm and dry.  Neurological:     General: No focal deficit present.     Mental Status: She is alert and oriented to person, place, and time.      ED Treatments / Results  Labs (all labs ordered are listed, but only abnormal results are displayed) Labs Reviewed  COMPREHENSIVE METABOLIC PANEL - Abnormal; Notable for the following components:      Result Value   Glucose, Bld 145 (*)    BUN 26 (*)    Total Protein 8.5 (*)    All other components within normal limits  ACETAMINOPHEN LEVEL - Abnormal; Notable for the following components:   Acetaminophen (Tylenol), Serum <10 (*)    All other components within normal limits  CBC - Abnormal; Notable for the following components:   RBC 5.22 (*)    Platelets 468 (*)    All other components within normal limits  RAPID URINE DRUG SCREEN, HOSP PERFORMED - Abnormal; Notable for the following components:   Benzodiazepines POSITIVE (*)    Amphetamines POSITIVE (*)    Tetrahydrocannabinol POSITIVE (*)    All other components within normal limits  CBG MONITORING, ED - Abnormal; Notable for the following components:   Glucose-Capillary 139 (*)    All other components within normal limits  ETHANOL  SALICYLATE LEVEL    EKG None  Radiology No results found.  Procedures Procedures (including critical care time)  Medications Ordered in ED Medications  ziprasidone (GEODON) 20 MG injection (  Canceled Entry 12/16/18 1959)  ziprasidone (GEODON) injection 10 mg (10 mg Intramuscular Not Given 12/16/18 1959)  ziprasidone (  GEODON) injection 20 mg (has no administration in time range)    And  LORazepam (ATIVAN) tablet 1 mg (has no administration in time range)  acetaminophen (TYLENOL) tablet 650 mg (has no administration in time range)  ondansetron (ZOFRAN) tablet 4 mg (has no administration in time range)  lisinopril (ZESTRIL) tablet 10 mg (has no administration in time range)  gabapentin (NEURONTIN)  capsule 300 mg (has no administration in time range)  venlafaxine XR (EFFEXOR-XR) 24 hr capsule 75 mg (has no administration in time range)  ziprasidone (GEODON) injection 10 mg (10 mg Intramuscular Given 12/16/18 1958)  sterile water (preservative free) injection (10 mLs  Given 12/16/18 1959)     Initial Impression / Assessment and Plan / ED Course  I have reviewed the triage vital signs and the nursing notes.  Pertinent labs & imaging results that were available during my care of the patient were reviewed by me and considered in my medical decision making (see chart for details).  63 year old female presenting with reported overdose of Xanax at home reporting she took 50 mg total approximately 20 minutes prior to arriving the ED.  PDMP shows that she is prescribed Xanax as well as Percocet as well as Ambien.  My exam the patient is awake and orientated with only a mild slur to her speech.  She has no evidence of respiratory distress or CNS depression.  She is completely coherent and her train of thought.  I will call poison control.  We will obtain a tox screen and monitor her closely.   Clinical Course as of Dec 15 2332  Mon Dec 16, 2018  1756 Poison control recommending activated charcoal 1 g/kg if the patient is awake enough and able to drink it.  Otherwise they are recommending an EKG and routine labs as well as an acetaminophen level and observation for period of 6 hours minimum.  If patient is still at her mental baseline at that point, there will be no further concerns from a medical overdose standpoint, she will need to be seen by her psychiatry team.   [MT]  1757 When I went back to speak with the patient she is refusing to drink activated charcoal.  She states "Do I even look stoned to you?  Do you think I need activated charcoal."  When I explained that 150 mg xanax can be a lethal dose and a serious matter, she says, "I'm obviously not overdosed, I have a tolerance."  I asked her if  she in fact took less than 150 mg and she will not reply.  She does remain fully oriented.  I will not force the charcoal on her as it would create a high aspiration risk.  Will monitor   [MT]  1918 Patient still awake and mentating well.  She is restless on the stretcher.  She states she was having "a PTSD moment" and is asking for more benzos.  She does not demonstrate any other signs of acute withdrawal.  Heart rate still within normal limits.   [MT]  2131 Benzodiazepines(!): POSITIVE [MT]  2131 Amphetamines(!): POSITIVE [MT]  2131 Tetrahydrocannabinol(!): POSITIVE [MT]  2316 Patient has been wide-awake for the past 2 hours, shouting at staff, demanding to see the doctor every couple of minutes.  At this time we have been observing her for 6 hours and a very low suspicion for further benzo overdose symptoms.  Will place her on psych hold and have her psychiatrist ER for suicidal ideations.  I have a  standing order for Ativan in case of benzo withdrawal symptoms.   [MT]    Clinical Course User Index [MT] Quinisha Mould, Kermit BaloMatthew J, MD    Final Clinical Impressions(s) / ED Diagnoses   Final diagnoses:  Intentional benzodiazepine overdose, initial encounter [facility](HCC)  Suicide attempt [facility](HCC)    ED Discharge Orders    None       Terald Sleeperrifan, Ogechi Kuehnel J, MD 12/16/18 2334

## 2018-12-16 NOTE — ED Notes (Addendum)
Pt states that she is having a "PTSD moment and needs something to relax her". This RN making EDP aware. No new orders at this time.

## 2018-12-17 ENCOUNTER — Encounter (HOSPITAL_COMMUNITY): Payer: Self-pay | Admitting: Physician Assistant

## 2018-12-17 ENCOUNTER — Inpatient Hospital Stay (HOSPITAL_COMMUNITY)
Admission: AD | Admit: 2018-12-17 | Discharge: 2018-12-20 | DRG: 885 | Disposition: A | Discharge status: Home / Self Care | Payer: Medicare Other | Attending: Psychiatry | Admitting: Psychiatry

## 2018-12-17 ENCOUNTER — Encounter (HOSPITAL_COMMUNITY): Payer: Self-pay

## 2018-12-17 ENCOUNTER — Other Ambulatory Visit: Payer: Self-pay

## 2018-12-17 DIAGNOSIS — Z818 Family history of other mental and behavioral disorders: Secondary | ICD-10-CM

## 2018-12-17 DIAGNOSIS — Z20828 Contact with and (suspected) exposure to other viral communicable diseases: Secondary | ICD-10-CM | POA: Diagnosis present

## 2018-12-17 DIAGNOSIS — F1310 Sedative, hypnotic or anxiolytic abuse, uncomplicated: Secondary | ICD-10-CM | POA: Diagnosis present

## 2018-12-17 DIAGNOSIS — Z87891 Personal history of nicotine dependence: Secondary | ICD-10-CM

## 2018-12-17 DIAGNOSIS — Z96611 Presence of right artificial shoulder joint: Secondary | ICD-10-CM | POA: Diagnosis present

## 2018-12-17 DIAGNOSIS — F1210 Cannabis abuse, uncomplicated: Secondary | ICD-10-CM | POA: Diagnosis present

## 2018-12-17 DIAGNOSIS — R45851 Suicidal ideations: Secondary | ICD-10-CM | POA: Diagnosis present

## 2018-12-17 DIAGNOSIS — Z981 Arthrodesis status: Secondary | ICD-10-CM | POA: Diagnosis not present

## 2018-12-17 DIAGNOSIS — Z9882 Breast implant status: Secondary | ICD-10-CM

## 2018-12-17 DIAGNOSIS — G4700 Insomnia, unspecified: Secondary | ICD-10-CM | POA: Diagnosis present

## 2018-12-17 DIAGNOSIS — F333 Major depressive disorder, recurrent, severe with psychotic symptoms: Secondary | ICD-10-CM | POA: Diagnosis not present

## 2018-12-17 DIAGNOSIS — M810 Age-related osteoporosis without current pathological fracture: Secondary | ICD-10-CM | POA: Diagnosis present

## 2018-12-17 DIAGNOSIS — F1510 Other stimulant abuse, uncomplicated: Secondary | ICD-10-CM | POA: Diagnosis present

## 2018-12-17 DIAGNOSIS — F4310 Post-traumatic stress disorder, unspecified: Secondary | ICD-10-CM | POA: Diagnosis present

## 2018-12-17 DIAGNOSIS — M069 Rheumatoid arthritis, unspecified: Secondary | ICD-10-CM | POA: Diagnosis present

## 2018-12-17 DIAGNOSIS — I10 Essential (primary) hypertension: Secondary | ICD-10-CM | POA: Diagnosis present

## 2018-12-17 DIAGNOSIS — R7303 Prediabetes: Secondary | ICD-10-CM | POA: Diagnosis present

## 2018-12-17 DIAGNOSIS — M797 Fibromyalgia: Secondary | ICD-10-CM | POA: Diagnosis present

## 2018-12-17 DIAGNOSIS — Z811 Family history of alcohol abuse and dependence: Secondary | ICD-10-CM | POA: Diagnosis not present

## 2018-12-17 LAB — SARS CORONAVIRUS 2 BY RT PCR (HOSPITAL ORDER, PERFORMED IN ~~LOC~~ HOSPITAL LAB): SARS Coronavirus 2: NEGATIVE

## 2018-12-17 MED ORDER — MAGNESIUM HYDROXIDE 400 MG/5ML PO SUSP: 30.0000 mL | Freq: Every day | ORAL | Status: DC | PRN

## 2018-12-17 MED ORDER — ACETAMINOPHEN 325 MG PO TABS: 650.0000 mg | ORAL_TABLET | Freq: Four times a day (QID) | ORAL | Status: DC | PRN

## 2018-12-17 MED ORDER — STERILE WATER FOR INJECTION IJ SOLN
INTRAMUSCULAR | Status: AC
  Administered 2018-12-17: 08:00:00
  Filled 2018-12-17: qty 10

## 2018-12-17 MED ORDER — ALPRAZOLAM 1 MG PO TABS: 1.0000 mg | ORAL_TABLET | Freq: Three times a day (TID) | ORAL | Status: DC | PRN

## 2018-12-17 MED ORDER — GABAPENTIN 300 MG PO CAPS
300.0000 mg | ORAL_CAPSULE | Freq: Three times a day (TID) | ORAL | Status: DC
  Administered 2018-12-18: 300 mg via ORAL
  Filled 2018-12-17 (×5): qty 1

## 2018-12-17 MED ORDER — MELOXICAM 15 MG PO TABS
15.0000 mg | ORAL_TABLET | Freq: Every day | ORAL | Status: DC
  Administered 2018-12-18 – 2018-12-20 (×3): 15 mg via ORAL
  Filled 2018-12-17 (×6): qty 1

## 2018-12-17 MED ORDER — VENLAFAXINE HCL ER 75 MG PO CP24
75.0000 mg | ORAL_CAPSULE | Freq: Every day | ORAL | Status: DC
  Administered 2018-12-18 – 2018-12-20 (×3): 75 mg via ORAL
  Filled 2018-12-17 (×5): qty 1

## 2018-12-17 MED ORDER — QUETIAPINE FUMARATE 100 MG PO TABS
100.0000 mg | ORAL_TABLET | Freq: Every day | ORAL | Status: DC
  Administered 2018-12-18: 100 mg via ORAL
  Filled 2018-12-17 (×3): qty 1

## 2018-12-17 MED ORDER — ONDANSETRON HCL 4 MG PO TABS: 8.0000 mg | ORAL_TABLET | Freq: Three times a day (TID) | ORAL | Status: DC | PRN

## 2018-12-17 MED ORDER — ALUM & MAG HYDROXIDE-SIMETH 200-200-20 MG/5ML PO SUSP: 30.0000 mL | ORAL | Status: DC | PRN

## 2018-12-17 MED ORDER — METHOCARBAMOL 500 MG PO TABS: 500.0000 mg | ORAL_TABLET | Freq: Three times a day (TID) | ORAL | Status: DC | PRN

## 2018-12-17 MED ORDER — HYDROXYZINE HCL 50 MG PO TABS
50.0000 mg | ORAL_TABLET | Freq: Four times a day (QID) | ORAL | Status: DC | PRN
  Administered 2018-12-18 – 2018-12-20 (×4): 50 mg via ORAL
  Filled 2018-12-17 (×4): qty 1

## 2018-12-17 MED ORDER — TRAZODONE HCL 50 MG PO TABS
50.0000 mg | ORAL_TABLET | Freq: Every evening | ORAL | Status: DC | PRN
  Administered 2018-12-19: 50 mg via ORAL
  Filled 2018-12-17: qty 1

## 2018-12-17 NOTE — ED Notes (Signed)
Pt discharged with [facility].  She was very groggy and we pushed her out in a wheelchair for safety.  All belongings were sent .
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
## 2018-12-17 NOTE — Progress Notes (Signed)
Patient did not attend wrap up group. 

## 2018-12-17 NOTE — Progress Notes (Signed)
Patient ID: Kara [name], female   DOB: [birth year], 63 y.o.   MRN: [id number] Patient admitted to the unit due to suicide attempt at home.  Patient unable to participate fully in admission due to urinary frequency and being medicated prior to arrival.

## 2018-12-17 NOTE — BH Assessment (Signed)
[facility] Assessment Progress Note  Per Buford Dresser, DO, this pt requires psychiatric hospitalization.  Pt has been assigned pt to [facility] Rm 400-1 pending Covid-19 test results, and an anticipated discharge from the Adult Unit.  Pt presents under IVC initiated by EDP Lisbeth Renshaw, MD, and IVC documents have been faxed to [facility].  Pt's nurse, Nena Jordan, has been notified, and agrees to call report to [phone].  Pt is to be transported via Event organiser.   Jalene Mullet, [place] Coordinator [phone]
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
## 2018-12-18 DIAGNOSIS — F333 Major depressive disorder, recurrent, severe with psychotic symptoms: Principal | ICD-10-CM

## 2018-12-18 MED ORDER — ZIPRASIDONE MESYLATE 20 MG IM SOLR
20.0000 mg | Freq: Once | INTRAMUSCULAR | Status: AC
  Administered 2018-12-18: 18:00:00 via INTRAMUSCULAR
  Filled 2018-12-18: qty 20

## 2018-12-18 MED ORDER — PHENOBARBITAL SODIUM 130 MG/ML IJ SOLN
130.0000 mg | Freq: Once | INTRAMUSCULAR | Status: AC
  Administered 2018-12-18: 130 mg via INTRAMUSCULAR
  Filled 2018-12-18: qty 1

## 2018-12-18 MED ORDER — ZIPRASIDONE MESYLATE 20 MG IM SOLR
INTRAMUSCULAR | Status: AC
  Administered 2018-12-18: 18:00:00 via INTRAMUSCULAR
  Filled 2018-12-18: qty 20

## 2018-12-18 MED ORDER — CARBAMAZEPINE 200 MG PO TABS
200.0000 mg | ORAL_TABLET | Freq: Three times a day (TID) | ORAL | Status: DC
  Administered 2018-12-18 – 2018-12-20 (×4): 200 mg via ORAL
  Filled 2018-12-18 (×13): qty 1

## 2018-12-18 MED ORDER — CLONAZEPAM 1 MG PO TABS
2.0000 mg | ORAL_TABLET | Freq: Three times a day (TID) | ORAL | Status: DC
  Administered 2018-12-18 – 2018-12-19 (×2): 2 mg via ORAL
  Filled 2018-12-18 (×2): qty 2

## 2018-12-18 NOTE — Progress Notes (Signed)
Patient ID: Kara [name], female   DOB: 05-20-1955, 63 y.o.   MRN: [id number]  [place] NOVEL CORONAVIRUS (COVID-19) DAILY CHECK-OFF SYMPTOMS - answer yes or no to each - every day NO YES  Have you had a fever in the past 24 hours?  . Fever (Temp > 37.80C / 100F) X   Have you had any of these symptoms in the past 24 hours? . New Cough .  Sore Throat  .  Shortness of Breath .  Difficulty Breathing .  Unexplained Body Aches   X   Have you had any one of these symptoms in the past 24 hours not related to allergies?   . Runny Nose .  Nasal Congestion .  Sneezing   X   If you have had runny nose, nasal congestion, sneezing in the past 24 hours, has it worsened?  X   EXPOSURES - check yes or no X   Have you traveled outside the state in the past 14 days?  X   Have you been in contact with someone with a confirmed diagnosis of COVID-19 or PUI in the past 14 days without wearing appropriate PPE?  X   Have you been living in the same home as a person with confirmed diagnosis of COVID-19 or a PUI (household contact)?    X   Have you been diagnosed with COVID-19?    X              What to do next: Answered NO to all: Answered YES to anything:   Proceed with unit schedule Follow the [facility] Inpatient Flowsheet.

## 2018-12-18 NOTE — Tx Team (Signed)
Interdisciplinary Treatment and Diagnostic Plan Update  12/18/2018 Time of Session: 9:10am Kara [name] MRN: [id number]  Principal Diagnosis: <principal problem not specified>  Secondary Diagnoses: Active Problems:   MDD (major depressive disorder), recurrent, severe, with psychosis (Center)   Current Medications:  Current Facility-Administered Medications  Medication Dose Route Frequency Provider Last Rate Last Dose  . acetaminophen (TYLENOL) tablet 650 mg  650 mg Oral Q6H PRN Sharma Covert, MD      . ALPRAZolam Duanne Moron) tablet 1 mg  1 mg Oral TID PRN Sharma Covert, MD      . alum & mag hydroxide-simeth (MAALOX/MYLANTA) 200-200-20 MG/5ML suspension 30 mL  30 mL Oral Q4H PRN Sharma Covert, MD      . gabapentin (NEURONTIN) capsule 300 mg  300 mg Oral TID Sharma Covert, MD   300 mg at 12/18/18 3893  . hydrOXYzine (ATARAX/VISTARIL) tablet 50 mg  50 mg Oral Q6H PRN Sharma Covert, MD      . magnesium hydroxide (MILK OF MAGNESIA) suspension 30 mL  30 mL Oral Daily PRN Sharma Covert, MD      . meloxicam [facility]) tablet 15 mg  15 mg Oral Daily Sharma Covert, MD   15 mg at 12/18/18 7342  . methocarbamol (ROBAXIN) tablet 500 mg  500 mg Oral Q8H PRN Sharma Covert, MD      . ondansetron [facility]) tablet 8 mg  8 mg Oral Q8H PRN Sharma Covert, MD      . QUEtiapine (SEROQUEL) tablet 100 mg  100 mg Oral QHS Sharma Covert, MD   100 mg at 12/18/18 0007  . traZODone (DESYREL) tablet 50 mg  50 mg Oral QHS PRN Sharma Covert, MD      . venlafaxine XR [facility]) 24 hr capsule 75 mg  75 mg Oral Q breakfast Sharma Covert, MD   75 mg at 12/18/18 [id number]   PTA Medications: Medications Prior to Admission  Medication Sig Dispense Refill Last Dose  . alendronate (FOSAMAX) 35 MG tablet Take 1 tablet (35 mg total) by mouth every 7 (seven) days. Take with a full glass of water on an empty stomach. (Patient not taking: Reported on 12/16/2018) 12 tablet 0   . ALPRAZolam  (XANAX XR) 1 MG 24 hr tablet Take 1 mg by mouth 2 (two) times daily as needed for anxiety.      . ALPRAZolam (XANAX) 1 MG tablet Take 1 mg by mouth 4 (four) times daily as needed for anxiety.     [name] amphetamine-dextroamphetamine (ADDERALL XR) 30 MG 24 hr capsule Take 60 mg by mouth every morning.      . gabapentin (NEURONTIN) 300 MG capsule Take 1 capsule by mouth 3 (three) times daily.     [name] lisinopril (PRINIVIL,ZESTRIL) 10 MG tablet TAKE 1 AND 1/2 TABLETS DAILY BY MOUTH (Patient taking differently: Take 10 mg by mouth daily. ) 45 tablet 1   . meloxicam (MOBIC) 15 MG tablet TAKE 1 TABLET (15 MG) BY MOUTH EVERY DAY (Patient taking differently: Take 15 mg by mouth daily. ) 30 tablet 1   . methocarbamol (ROBAXIN) 500 MG tablet Take 1 tablet (500 mg total) by mouth 2 (two) times daily. (Patient not taking: Reported on 12/16/2018) 10 tablet 0   . oxyCODONE-acetaminophen (PERCOCET) 5-325 MG tablet Take 1 tablet by mouth every 4 (four) hours as needed for severe pain. 20 tablet 0   . QUEtiapine (SEROQUEL) 50 MG tablet Take 50-100 mg by mouth  at bedtime.     . tretinoin (RETIN-A) 0.1 % cream Apply 1 application topically at bedtime as needed (facial blemishes).   3   . valACYclovir (VALTREX) 1000 MG tablet Take 2 tablets (2,000 mg total) by mouth 2 (two) times daily. (Patient not taking: Reported on 12/16/2018) 4 tablet 0   . venlafaxine XR (EFFEXOR-XR) 75 MG 24 hr capsule Take 75 mg by mouth daily with breakfast.     . zolpidem (AMBIEN) 10 MG tablet Take 10 mg by mouth at bedtime.       Patient Stressors:    Patient Strengths:    Treatment Modalities: Medication Management, Group therapy, Case management,  1 to 1 session with clinician, Psychoeducation, Recreational therapy.   Physician Treatment Plan for Primary Diagnosis: <principal problem not specified> Long Term Goal(s):     Short Term Goals:    Medication Management: Evaluate patient's response, side effects, and tolerance of medication  regimen.  Therapeutic Interventions: 1 to 1 sessions, Unit Group sessions and Medication administration.  Evaluation of Outcomes: Not Met  Physician Treatment Plan for Secondary Diagnosis: Active Problems:   MDD (major depressive disorder), recurrent, severe, with psychosis ([place])  Long Term Goal(s):     Short Term Goals:       Medication Management: Evaluate patient's response, side effects, and tolerance of medication regimen.  Therapeutic Interventions: 1 to 1 sessions, Unit Group sessions and Medication administration.  Evaluation of Outcomes: Not Met   RN Treatment Plan for Primary Diagnosis: <principal problem not specified> Long Term Goal(s): Knowledge of disease and therapeutic regimen to maintain health will improve  Short Term Goals: Ability to verbalize frustration and anger appropriately will improve, Ability to demonstrate self-control, Ability to participate in decision making will improve, Ability to verbalize feelings will improve, Ability to disclose and discuss suicidal ideas, Ability to identify and develop effective coping behaviors will improve and Compliance with prescribed medications will improve  Medication Management: RN will administer medications as ordered by provider, will assess and evaluate patient's response and provide education to patient for prescribed medication. RN will report any adverse and/or side effects to prescribing provider.  Therapeutic Interventions: 1 on 1 counseling sessions, Psychoeducation, Medication administration, Evaluate responses to treatment, Monitor vital signs and CBGs as ordered, Perform/monitor CIWA, COWS, AIMS and Fall Risk screenings as ordered, Perform wound care treatments as ordered.  Evaluation of Outcomes: Not Met   LCSW Treatment Plan for Primary Diagnosis: <principal problem not specified> Long Term Goal(s): Safe transition to appropriate next level of care at discharge, Engage patient in therapeutic group  addressing interpersonal concerns.  Short Term Goals: Engage patient in aftercare planning with referrals and resources  Therapeutic Interventions: Assess for all discharge needs, 1 to 1 time with Social worker, Explore available resources and support systems, Assess for adequacy in community support network, Educate family and significant other(s) on suicide prevention, Complete Psychosocial Assessment, Interpersonal group therapy.  Evaluation of Outcomes: Not Met   Progress in Treatment: Attending groups: No. New to unit  Participating in groups: No. Taking medication as prescribed: Yes. Toleration medication: Yes. Family/Significant other contact made: No, will contact:  if patient consents to collateral contacts Patient understands diagnosis: Yes. Discussing patient identified problems/goals with staff: Yes. Medical problems stabilized or resolved: Yes. Denies suicidal/homicidal ideation: No. Issues/concerns per patient self-inventory: No. Other:   New problem(s) identified:  New Short Term/Long Term Goal(s):medication stabilization, elimination of SI thoughts, development of comprehensive mental wellness plan.    Patient Goals:  "I do  not have any goals"   Discharge Plan or Barriers: Patient recently admitted. CSW will continue to follow and assess for appropriate referrals and possible discharge planning.    Reason for Continuation of Hospitalization: Aggression Anxiety Depression Medication stabilization Suicidal ideation  Estimated Length of Stay: 3-5 days   Attendees: Patient: Kara [name]  12/18/2018 9:26 AM  Physician: Dr. Myles Lipps, MD 12/18/2018 9:26 AM  Nursing: Vladimir Faster.Viona Gilmore, RN 12/18/2018 9:26 AM  RN Care Manager: 12/18/2018 9:26 AM  Social Worker: Radonna Ricker, LCSW 12/18/2018 9:26 AM  Recreational Therapist:  12/18/2018 9:26 AM  Other:  12/18/2018 9:26 AM  Other:  12/18/2018 9:26 AM  Other: 12/18/2018 9:26 AM    Scribe for Treatment Team: Marylee Floras,  [place] 12/18/2018 9:26 AM

## 2018-12-18 NOTE — H&P (Signed)
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 uncontrollably through the latter part of the interview.  Long Term Goal(s): Improvement in symptoms so as ready for discharge  Short Term Goals: Ability to verbalize feelings will improve, Ability to demonstrate self-control will improve, Ability to maintain clinical measurements within normal limits will improve, Compliance with prescribed medications will improve and Ability to identify triggers associated with substance abuse/mental health issues will improve  Physician Treatment Plan for Secondary Diagnosis: Active Problems:   MDD (major depressive disorder), recurrent, severe, with psychosis ([facility])  Long Term Goal(s): Improvement in symptoms so as ready for discharge  Short Term Goals: Ability to verbalize feelings will improve, Ability to disclose and discuss suicidal ideas, Ability to demonstrate self-control will improve, Ability to identify and develop effective coping behaviors will  improve and Ability to maintain clinical measurements within normal limits will improve  I certify that inpatient services furnished can reasonably be expected to improve the patient's condition.    Malvin Johns, MD 9/23/202010:08 AM

## 2018-12-18 NOTE — Progress Notes (Signed)
Patient ID: Kara [name], female   DOB: 02/18/1956, 63 y.o.   MRN: [id number] Pt woke up at midnight, took her scheduled dose of Seroquel and went back to sleep, and continues to sleep at this time with no signs of distress.  Denied SI/HI/AVH, 1:1 staff continues to be at bedside for her safety.  Will continue to monitor.

## 2018-12-18 NOTE — Progress Notes (Signed)
D: Pt ben sleep in room majority of the evening. A: Pt was offered support and encouragement . Pt was encourage to attend groups. Q 15 minute checks were done for safety.  R: safety maintained on unit.

## 2018-12-18 NOTE — Progress Notes (Addendum)
Patient ID: Kara [name], female   DOB: 1955-11-12, 63 y.o.   MRN: [id number] Pt in bed sleeping with no signs of distress. 1:1 staff at bedside for safety. Respirations even and unlabored, will continue to monitor.

## 2018-12-18 NOTE — [facility] Suicide Risk Assessment (Signed)
[facility] Admission Suicide Risk Assessment   Nursing information obtained from:  Patient Demographic factors:  Caucasian Current Mental Status:  NA Loss Factors:  NA Historical Factors:  NA Risk Reduction Factors:  Living with another person, especially a relative  Total Time spent with patient: 45 minutes Principal Problem: Inability to modulate behavior and mood/status post overdose by report/giving contradictory information Diagnosis:  Active Problems:   MDD (major depressive disorder), recurrent, severe, with psychosis ([place])  Subjective Data: Admitted for presumed overdose patient denies although had acknowledged suicidality on her initial chief complaint  Continued Clinical Symptoms:  Alcohol Use Disorder Identification Test Final Score (AUDIT): 0 The "Alcohol Use Disorders Identification Test", Guidelines for Use in Primary Care, Second Edition.  World Pharmacologist [facility]). Score between 0-7:  no or low risk or alcohol related problems. Score between 8-15:  moderate risk of alcohol related problems. Score between 16-19:  high risk of alcohol related problems. Score 20 or above:  warrants further diagnostic evaluation for alcohol dependence and treatment.   CLINICAL FACTORS:   Alcohol/Substance Abuse/Dependencies  Musculoskeletal: Strength & Muscle Tone: within normal limits Gait & Station: normal Patient leans: N/A  Psychiatric Specialty Exam: Physical Exam  Nursing note and vitals reviewed. Constitutional: She appears well-developed and well-nourished.    Review of Systems  Constitutional: Negative.   Eyes: Negative.   Gastrointestinal: Negative.   Neurological: Negative.   Endo/Heme/Allergies: Negative.     Blood pressure 111/69, pulse 95, temperature 97.7 F (36.5 C), temperature source Oral, resp. rate 18, SpO2 (!) 86 %.There is no height or weight on file to calculate BMI.  General Appearance: Disheveled  Eye Contact:  Minimal  Speech:  Pressured  Volume:   Increased  Mood:  Anxious, Irritable and Labile and hysterical  Affect:  Labile  Thought Process:  Descriptions of Associations: Intact  Orientation:  Full (Time, Place, and Person)  Thought Content:  Illogical  Suicidal Thoughts:  Yes.  without intent/plan insured has given contradictory information during her last 48 hours  Homicidal Thoughts:  No  Memory:  Immediate;   Poor Recent;   Poor Remote;   Poor  Judgement:  Impaired  Insight:  Lacking  Psychomot or Activity:  Restlessness  Concentration:  Concentration: Poor and Attention Span: Poor  Recall:  Poor  Fund of Knowledge:  Fair  Language:  Profane and abusive  Akathisia:  Negative  Handed:  Right  AIMS (if indicated):     Assets:  Physical Health Resilience Social Support  ADL's:  Intact  Cognition:  WNL  Sleep:        COGNITIVE FEATURES THAT CONTRIBUTE TO RISK:  Polarized thinking and Thought constriction (tunnel vision)    SUICIDE RISK:   Severe:  Frequent, intense, and enduring suicidal ideation, specific plan, no subjective intent, but some objective markers of intent (i.e., choice of lethal method), the method is accessible, some limited preparatory behavior, evidence of impaired self-control, severe dysphoria/symptomatology, multiple risk factors present, and few if any protective factors, particularly a lack of social support.  PLAN OF CARE: Must err on the side of caution and assume her early reports of suicidality are justified  I certify that inpatient services furnished can reasonably be expected to improve the patient's condition.   Johnn Hai, MD 12/18/2018, 10:26 AM

## 2018-12-18 NOTE — Progress Notes (Signed)
[facility] 1:1 Observation Documentation  For the first (8) hours following discontinuation of 1:1 precautions, a progress note entry by nursing staff should be documented at least every 2 hours, reflecting the patient's behavior, condition, mood, and conversation.  Use the progress notes for additional entries.  Time 1:1 discontinued:  11:00   Patient's Behavior:  Patient has been able to ambulate with a steady gate, patient free of harm to self and others.     Patient's Condition:   Patient is irritable but has had no aggressive behaviors.    Patient's Conversation:  Patient forwarded little in conversation.  Clarita Crane  12/18/2018, 11:09 AM

## 2018-12-18 NOTE — Plan of Care (Signed)
  Problem: Coping: Goal: Ability to verbalize frustrations and anger appropriately will improve Outcome: Not Progressing Goal: Ability to demonstrate self-control will improve Outcome: Not Progressing   Problem: Safety: Goal: Periods of time without injury will increase Outcome: Progressing   

## 2018-12-19 MED ORDER — PHENOBARBITAL SODIUM 130 MG/ML IJ SOLN
130.0000 mg | Freq: Once | INTRAMUSCULAR | Status: AC
  Administered 2018-12-19: 130 mg via INTRAMUSCULAR
  Filled 2018-12-19: qty 1

## 2018-12-19 MED ORDER — CLONAZEPAM 1 MG PO TABS
1.0000 mg | ORAL_TABLET | Freq: Three times a day (TID) | ORAL | Status: AC
  Administered 2018-12-19 – 2018-12-20 (×3): 1 mg via ORAL
  Filled 2018-12-19 (×3): qty 1

## 2018-12-19 NOTE — Progress Notes (Signed)
D:  Patient has been upset today, crying, screaming, hitting wall with her fists, agitated.  This morning patient was in quiet room with 1:1.  1:1 was discontinued at 11:00.  MD, SW and staff talked to patient repeatedly throughout the day.  Patient was intermittently upset and then calm for awhile.  Patient has been laying in bed, walking the hallway, repeatedly asking for medications.   A:  Medications administered per MD orders.  Emotional support and encouragement given throughout the day by all staff. R:  Safety maintained after 1:1 discontinued with 15 minute checks.

## 2018-12-19 NOTE — Plan of Care (Signed)
Patient continues to scream uncontrollably then began destroying her room therefore we had no choice but to put her in the seclusion room.
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
## 2018-12-19 NOTE — Plan of Care (Signed)
Nurse attempted to discuss anxiety, depression, coping skills with patient throughout the day.

## 2018-12-19 NOTE — [facility] Group Notes (Signed)
LCSW Group Therapy Note  Date and Time: 12/19/2018 @ 1:30pm  Type of Therapy and Topic: Group Therapy: Feelings Around Returning Home & Establishing a Supportive Framework and Supporting Oneself When Supports Not Available  Participation Level: [facility] PARTICIPATION LEVEL: Did Not Attend  Mood: Did not attend     Description of Group:  Patients first processed thoughts and feelings about upcoming discharge. These included fears of upcoming changes, lack of change, new living environments, judgements and expectations from others and overall stigma of mental health issues. The group then discussed the definition of a supportive framework, what that looks and feels like, and how do to discern it from an unhealthy non-supportive network. The group identified different types of supports as well as what to do when your family/friends are less than helpful or unavailable     Therapeutic Goals   1.  Patient will identify one healthy supportive network that they can use at discharge.  2.  Patient will identify one factor of a supportive framework and how to tell it from an unhealthy network.  3.  Patient able to identify one coping skill to use when they do not have positive supports from others.  4.  Patient will demonstrate ability to communicate their needs through discussion and/or role plays.     Summary of Patient Progress:     Patient did not attend group today.        Therapeutic Modalities  Cognitive Behavioral Therapy  Motivational Interviewing   Aziya Arena, MSW, LCSW  

## 2018-12-19 NOTE — Progress Notes (Signed)
Recreation Therapy Notes  Date: 9.24.20 Time: 0945 Location: 500 Hall Dayroom   Group Topic: Communication, Team Building, Problem Solving  Goal Area(s) Addresses:  Patient will effectively work with peer towards shared goal.  Patient will identify skill used to make activity successful.  Patient will identify how skills used during activity can be used to reach post d/c goals.   Intervention: STEM Activity   Activity: [employer]. Patients were provided the following materials: 5 drinking straws, 5 rubber bands, 5 paper clips, 2 index cards and 2 drinking cups. Using the provided materials patients were asked to build a launching mechanisms to launch a ping pong ball approximately 12 feet. Patients were divided into teams of 3-5.   Education: Education officer, community, Dentist.   Education Outcome: Acknowledges education/In group clarification offered/Needs additional education.   Clinical Observations/Feedback:  Pt did not attend group.    Victorino Sparrow, LRT/CTRS         Victorino Sparrow A 12/19/2018 10:57 AM

## 2018-12-19 NOTE — Progress Notes (Signed)
D: Pt stated she has a hard time sleeping and wanted to leave. Pt stated she needed to let the doctor know she needed something different to help her sleep.  A: Pt was offered support and encouragement. Pt was given scheduled medications. Pt was encourage to attend groups. Q 15 minute checks were done for safety.  R: safety maintained on unit.

## 2018-12-19 NOTE — Progress Notes (Signed)
Recreation Therapy Notes  INPATIENT RECREATION THERAPY ASSESSMENT  Patient Details Name: Kara [name] MRN: [id number] DOB: October 29, 1955 Today's Date: 12/19/2018       Information Obtained From: Patient(Some information came from chart review)  Able to Participate in Assessment/Interview: Yes  Patient Presentation: Alert, Responsive  Reason for Admission (Per Patient): Suicide Attempt  Patient Stressors: Family, Relationship(Per chart daughter's homelessness and mental illness; Separation from husband and pending divorce)  Coping Skills:   Journal, Sports, Music, Exercise, Meditate, Deep Breathing, Talk, Art, Prayer, Avoidance, Read, Dance, Hot Bath/Shower  Leisure Interests (2+):  Petra Kuba - [employer], Individual - Reading, Individual - Other (Comment)(Play with dogs)  Frequency of Recreation/Participation: Other (Comment)(Spring and Summer)  Awareness of Community Resources:  Yes  Community Resources:  Park, Art therapist, Patent examiner  Current Use: Yes  If no, Barriers?:    Expressed Interest in [place]: No  [employer] of Residence:  Investment banker, corporate  Patient Main Form of Transportation: Musician  Patient Strengths:  Compassionate; Empathetic  Patient Identified Areas of Improvement:  Emotional; Impulsivity  Patient Goal for Hospitalization:  "to get out"  Current SI (including self-harm):  No  Current HI:  No  Current AVH: No  Staff Intervention Plan: Group Attendance, Collaborate with Interdisciplinary Treatment Team  Consent to Intern Participation: N/A    Victorino Sparrow, LRT/CTRS  Ria Comment, Jenniffer Vessels A 12/19/2018, 3:11 PM

## 2018-12-19 NOTE — Progress Notes (Signed)
[facility] Second Physician Opinion Progress Note for Medication Administration to Non-consenting Patients (For Involuntarily Committed Patients)  Patient: Kara [name] Date of Birth: [id number] MRN: [id number]  Reason for the Medication: The patient, without the benefit of the specific treatment measure, is incapable of participating in any available treatment plan that will give the patient a realistic opportunity of improving the patient's condition.  Consideration of Side Effects: Consideration of the side effects related to the medication plan has been given.  Rationale for Medication Administration: Patient is seen and examined.  Patient is a 63 year old female who was admitted on 12/18/2018 after an intentional overdose of Xanax.  I initially saw the patient on that same date, but the decision was made to transfer her to the [facility] because of her behavior.  She was emotionally dysregulated, yelling, screaming, demanding another doctor.  She was transferred to the 500 hall.  Unfortunately her behavior has continued.  She is taking oral medication, but does require a forced medication with regard to a mood stabilizing antipsychotic or a mood stabilizing medicine given her histrionic and emotional dysregulation.  I agree with Dr. Jake Samples that if necessary the patient should receive forced medications given the disturbance she is causing in the unit and the potential self-harm to herself, the staff, and this disruption to the other patients that this is causing.    Sharma Covert, MD 12/19/18  10:12 AM   This documentation is good for (7) seven days from the date of the MD signature. New documentation must be completed every seven (7) days with detailed justification in the medical record if the patient requires continued non-emergent administration of psychotropic medications.

## 2018-12-19 NOTE — Progress Notes (Signed)
Pt initially stated she had ok day, but it got better. Pt started getting agitated later in the evening. Pt was given her Klonopin she missed at 6 pm with her Trazodone and Vistaril per MAR. Pt appeared trying disruptive behaviors so she could get a shot.

## 2018-12-19 NOTE — B[facility] Counselor (Signed)
CSW unable to complete assessment today due to patient continuation of being upset, screaming, and inability to control her emotions.   CSW will attempt again on following day.

## 2018-12-19 NOTE — Progress Notes (Addendum)
Pt observed to be verbally agitated and tearful in the dayroom after speaking with her husband on the phone. Pt became verbally aggressive towards physician "you're an ass hole, I don't want you for my doctor, I want another fucking doctor, I want to leave now". Pt was very disruptive in milieu. Verbal redirections and emotional support offered to pt but to no avail as pt continues to escalate in behavior. Phenobarbital 130 mg IM given at 1001 at early stages of her escalation. New order received and initiated for seclusion with an assigned 1:1 staff in place at 1010. [place] made aware. Will continue to monitor patient and report changes in behavior as it occurs.

## 2018-12-20 ENCOUNTER — Other Ambulatory Visit (HOSPITAL_COMMUNITY): Payer: 59

## 2018-12-20 MED ORDER — CARBAMAZEPINE 200 MG PO TABS: 200.0000 mg | ORAL_TABLET | Freq: Three times a day (TID) | ORAL | 2 refills | Status: DC

## 2018-12-20 MED ORDER — QUETIAPINE FUMARATE 50 MG PO TABS: 50.0000 mg | ORAL_TABLET | Freq: Every day | ORAL | 2 refills | Status: DC

## 2018-12-20 NOTE — Plan of Care (Signed)
Pt is being discharged.    Elridge Stemm, LRT/CTRS 

## 2018-12-20 NOTE — [facility] Suicide Risk Assessment (Signed)
[facility] Discharge Suicide Risk Assessment   Principal Problem: Contradictory information regarding overdose but patient did make suicidal statements on initial presentation.  Currently denies suicidal thoughts plans or intent.  Has displayed behavioral control for the past day Discharge Diagnoses: Active Problems:   MDD (major depressive disorder), recurrent, severe, with psychosis ([place])   Total Time spent with patient: 45 minutes  Musculoskeletal: Strength & Muscle Tone: within normal limits Gait & Station: normal Patient leans: N/A  Psychiatric Specialty Exam: ROS  Blood pressure (!) 127/96, pulse 90, temperature 98.6 F (37 C), temperature source Oral, resp. rate 18, SpO2 (!) 86 %.There is no height or weight on file to calculate BMI.  General Appearance: Casual  Eye Contact::  Good  Speech:  Clear and Coherent409  Volume:  Normal  Mood:  Euthymic  Affect:  Constricted  Thought Process:  Coherent, Linear and Descriptions of Associations: Intact  Orientation:  Full (Time, Place, and Person)  Thought Content:  Rumination  Suicidal Thoughts:  No  Homicidal Thoughts:  No  Memory:  Immediate;   Fair Recent;   Fair  Judgement:  Fair  Insight:  Fair  Psychomotor Activity:  Normal  Concentration:  Fair  Recall:  [employer] of Knowledge:Fair  Language: Fair  Akathisia:  neg  Handed:  Right  AIMS (if indicated):     Assets:  Communication Skills Housing Resilience Social Support  Sleep:  Number of Hours: 7.75  Cognition: WNL  ADL's:  Intact   Mental Status Per Nursing Assessment::   On Admission:  NA  Demographic Factors:  Unemployed  Loss Factors: Decrease in vocational status and Loss of significant relationship  Historical Factors: Impulsivity  Risk Reduction Factors:   Sense of responsibility to family and Religious beliefs about death  Continued Clinical Symptoms:  Alcohol/Substance Abuse/Dependencies  Cognitive Features That Contribute To Risk:   Polarized thinking    Suicide Risk:  Minimal: No identifiable suicidal ideation.  Patients presenting with no risk factors but with morbid ruminations; may be classified as minimal risk based on the severity of the depressive symptoms    Plan Of Care/Follow-up recommendations:  Activity:  full  Brisha Mccabe, MD 12/20/2018, 9:05 AM

## 2018-12-20 NOTE — Discharge Summary (Signed)
Physician Discharge Summary Note  Patient:  Kara [name] is an 63 y.o., female  MRN:  [id number]  DOB:  06/24/1955  Patient phone:  [phone] (home)   Patient address:   [address] [facility] [address],   Total Time spent with patient: Greater than 30 minutes   Date of Admission:  12/17/2018  Date of Discharge: 12/20/18  Reason for Admission: Suicidal threats & attempt by overdose on Xanax.   Principal Problem: MDD (major depressive disorder), recurrent, severe, with psychosis ([place])  Discharge Diagnoses: Principal Problem:   MDD (major depressive disorder), recurrent, severe, with psychosis ([place])  Past Medical History:  Past Medical History:  Diagnosis Date  . ADD (attention deficit disorder)    on Adderal  . ADD (attention deficit disorder)   . Aggressive behavior of adult   . Anemia   . Anxiety   . Cellulitis of breast 11/2013   RIGHT BREAST  . Childhood asthma    as child  . Chronic fatigue and immune dysfunction syndrome ([place])   . Chronic lower back pain   . Chronic pain    went to [facility] for pain control; stopped in 2016 " (02/23/2017)  . Cold sore   . Complication of anesthesia    Ketamine makes her hallucinate  . 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 Degenerative disc disease, lumbar   . Depression    takes meds daily  . Family history of malignant neoplasm of breast   . Fibromyalgia   . Fibromyalgia   . H/O degenerative disc disease   . History of kidney stones   . Hypertension   . Osteoporosis   . Osteoporosis   . Other specified rheumatoid arthritis, right shoulder ([place]) 08/01/2011  . Pneumonia 2010?  [name] Post-nasal drip    hx of  . Pre-diabetes    patient states she is not Pre-Diabetic at appt. 12/16/2018  . PTSD (post-traumatic stress disorder)   . RA (rheumatoid arthritis) ([facility])    autoimmune arthritis  . RA (rheumatoid arthritis) ([place])   . Spondylitis ([place])   . Suicidal  intent     Past Surgical History:  Procedure Laterality Date  . ANTERIOR CERVICAL DECOMP/DISCECTOMY FUSION N/A 03/15/2015   Procedure: Cervical five-six, Cerival six-seven, Anterior Cervical Discectomy and Fusion, Allograft and Plate;  Surgeon: Marybelle Killings, MD;  Location: [place];  Service: Orthopedics;  Laterality: N/A;  . ANTERIOR LAT LUMBAR FUSION Left 06/11/2018   Procedure: Left Lumbar two-three Anterolateral decompression/fusion/lateral plate fixation;  Surgeon: Kristeen Miss, MD;  Location: [place];  Service: Neurosurgery;  Laterality: Left;  Left Lumbar two-three Anterolateral decompression/fusion/lateral plate fixation  . AUGMENTATION MAMMAPLASTY  2003  . BACK SURGERY    . BLADDER SUSPENSION  2009  . BONE EXCISION Right 11/26/2018   Procedure: TRAPEZIUM EXCISION RIGHT THUMB;  Surgeon: Daryll Brod, MD;  Location: [place];  Service: Orthopedics;  Laterality: Right;  AXILLARY BLOCK  . BREAST IMPLANT REMOVAL Bilateral 10/2013  . CARPOMETACARPEL SUSPENSION PLASTY Right 11/26/2018   Procedure: [address] SUSPENSION;  Surgeon: Daryll Brod, MD;  Location: [place];  Service: Orthopedics;  Laterality: Right;  . CERVICAL WOUND DEBRIDEMENT N/A 07/07/2016   Procedure: IRRIGATION AND DEBRIDEMENT POSTERIOR NECK;  Surgeon: Marybelle Killings, MD;  Location: [place];  Service: Orthopedics;  Laterality: N/A;  . COLONOSCOPY    . COMBINED ABDOMINOPLASTY AND LIPOSUCTION  2003  . INCISION AND DRAINAGE ABSCESS Right 01/16/2014  Procedure: INCISION AND DRAINAGE AND OF RIGHT BREAST ABCESS;  Surgeon: Excell Seltzer, MD;  Location: [facility];  Service: General;  Laterality: Right;  . JOINT REPLACEMENT    . LUMBAR LAMINECTOMY/DECOMPRESSION MICRODISCECTOMY N/A 12/10/2015   Procedure: Right L3-4 Hemilaminectomy, Excision of herniated nucleus pulposus;  Surgeon: Marybelle Killings, MD;  Location: [place];  Service: Orthopedics;  Laterality: N/A;  . MASS EXCISION  11/03/2011   Procedure: MINOR EXCISION OF  MASS;  Surgeon: Cammie Sickle., MD;  Location: [place];  Service: Orthopedics;  Laterality: Left;  debride IP joint, cyst excision left index  . MAXIMUM ACCESS (MAS) TRANSFORAMINAL LUMBAR INTERBODY FUSION (TLIF) 2 LEVEL Right 02/23/2017  . POSTERIOR CERVICAL FUSION/FORAMINOTOMY N/A 06/21/2016   Procedure: POSTERIOR CERVICAL FUSION C5-C7 SPINOUS PROCESS WIRING;  Surgeon: Marybelle Killings, MD;  Location: [place];  Service: Orthopedics;  Laterality: N/A;  . POSTERIOR LUMBAR FUSION  07/2009; 07/03/2014   "L4-5; L5-S1"  . SHOULDER ARTHROSCOPY Right 2012  . TENDON TRANSFER Right 11/26/2018   Procedure: TENDON TRANSFER;  Surgeon: Daryll Brod, MD;  Location: [place];  Service: Orthopedics;  Laterality: Right;  . TONSILLECTOMY  1987  . TOTAL SHOULDER ARTHROPLASTY  08/01/2011   Procedure: TOTAL SHOULDER ARTHROPLASTY;  Surgeon: Johnny Bridge, MD;  Location: [place];  Service: Orthopedics;  Laterality: Right;  Right total shoulder arthroplasty  . TUBAL LIGATION  1988   Family History:  Family History  Problem Relation Age of Onset  . Arthritis Mother   . Heart disease Mother        ?psvt  . Breast cancer Mother 41       TAH/BSO  . Cancer Mother   . Mental illness Mother   . COPD Father   . Hypertension Father   . Alcohol abuse Father   . Mental illness Father   . Heart disease Father   . Healthy Daughter   . Breast cancer Maternal Aunt 27       deceased  . Cancer Cousin 75       female; unknown primary  . Colon cancer Paternal Aunt 53       deceased at 28  . Stomach cancer Paternal Uncle 97       deceased at 40  . Alcohol abuse Brother   . Cancer Brother   . Hodgkin's lymphoma Brother   . Mental illness Brother   . HIV Brother   . Healthy Brother   . Healthy Son    Social History:  Social History   Substance and Sexual Activity  Alcohol Use Yes   Comment: social     Social History   Substance and Sexual Activity  Drug Use Yes  . Types:  Marijuana   Comment: smoked marjuana yesterday 12/15/2018    Social History   Socioeconomic History  . Marital status: Married    Spouse name: Not on file  . Number of children: Not on file  . Years of education: Not on file  . Highest education level: Not on file  Occupational History  . Not on file  Social Needs  . Financial resource strain: Not on file  . Food insecurity    Worry: Not on file    Inability: Not on file  . Transportation needs    Medical: Not on file    Non-medical: Not on file  Tobacco Use  . Smoking status: Former Smoker    Packs/day: 1.00    Years: 10.00  Pack years: 10.00    Types: Cigarettes    Quit date: 12/06/2005    Years since quitting: 13.0  . Smokeless tobacco: Never Used  Substance and Sexual Activity  . Alcohol use: Yes    Comment: social  . Drug use: Yes    Types: Marijuana    Comment: smoked marjuana yesterday 12/15/2018  . Sexual activity: Yes    Birth control/protection: Post-menopausal  Lifestyle  . Physical activity    Days per week: Not on file    Minutes per session: Not on file  . Stress: Not on file  Relationships  . Social Herbalist on phone: Not on file    Gets together: Not on file    Attends religious service: Not on file    Active member of club or organization: Not on file    Attends meetings of clubs or organizations: Not on file    Relationship status: Not on file  Other Topics Concern  . Not on file  Social History Narrative   Married, Kaysea Raya.    6 children.    [employer], Retired Education officer, museum.    Denies alcohol, tobacco or drug use.   Drinks caffeinated beverages. Uses herbal remedies. Take a daily vitamin.   Wears her seatbelt. Exercises greater than 3 times a week.   Smoke detector in the home, firearms in the home in a locked cabinet, feels safe in her relationships.   Hospital Course: (Per Md's admission evaluation):  Ms. Laverta Baltimore is 63 years of age she is by her report separated and she  presented on 12/16/18 with the chief complaint that she had indeed overdosed on Xanax and verbalized suicidal intent stating she would "rather be dead" and even threatened to sit in her car and poison herself with carbon monoxide. Stressors are listed as her daughters homelessness and mental illness, separation and pending divorce so forth apparently the patient had been prescribed 360 tablets of 1 mg Xanax about a week prior.  She also has prescriptions for stimulants/Percocet and Ambien within the past year according to the databank. The patient then began giving contradictory information she stated she had overdosed on purpose simply to frghten her husband. She was described as cursing at the staff with profanity mocking the staff so forth on 9/21 and into 12/17/18. The patient now states she did not overdose she spilled all the pills on the floor and just took 2 of them which of course is completely contradictory from the information she had previously told.  She denies that she is suicidal and insist that her husband brought her here because of the divorce and to make her situation look unstable and bad therefore she has been wrongfully admitted/petition. She was seen initially on the 400 hall she was angry and irritable but gave me a decent of interview, when transferred to the 500 hall she became quite uncontrollable as far as her screaming and cursing so forth she began screaming out uncontrollably no particular words, just screaming then she began cussing at the staff again saying "f- all of you!"  She also was so disruptive to the milieu that we told her if she kept screaming she had to have the door shut or go to the seclusion room of course this just resulted to more profanity.  After the above admission evaluation, Jeane was recommended for mood stabilization treatments based on her presenting symptoms. She received, was stabilized & discharged on the medications as listed on her discharge  medication  lists. She was also enrolled & participated in the group counseling sessions being offered & held on this unit. She learned coping skills. Dianelys presented other significant pre-existing medical issues that required treatment & or monitoring. She tolerated her treatment regimen without any adverse effects or reactions reported.  Zonnique's symptoms responded well to her treatment regimen. Her symptoms has subsided & mood stable. Patient has met the maximum benefit of this hospitalization. She is currently mentally & medically stable to continue mental health care & medication management on an outpatient basis as noted below. She is provided with all the necessary information needed to make this appointment without problems.   Upon her hospital discharge discharge evaluation, Deshae adamantly denies any suicidal/homicidal ideations, auditory/visual hallucinations delusional thinking, paranoia or substance withdrawal symptoms. She left [facility] with all personal belongings in no apparent distress. Transportation per family (Husband)  Physical Findings: AIMS: Facial and Oral Movements Muscles of Facial Expression: None, normal Lips and Perioral Area: None, normal Jaw: None, normal Tongue: None, normal,Extremity Movements Upper (arms, wrists, hands, fingers): None, normal Lower (legs, knees, ankles, toes): None, normal, Trunk Movements Neck, shoulders, hips: None, normal, Overall Severity Severity of abnormal movements (highest score from questions above): None, normal Incapacitation due to abnormal movements: None, normal Patient's awareness of abnormal movements (rate only patient's report): No Awareness, Dental Status Current problems with teeth and/or dentures?: No Does patient usually wear dentures?: No  CIWA:  CIWA-Ar Total: 5 COWS:     Musculoskeletal: Strength & Muscle Tone: within normal limits Gait & Station: normal Patient leans: N/A  Psychiatric Specialty Exam: Physical Exam   Nursing note and vitals reviewed. Cardiovascular:  Hx. HTN: 127/96  Respiratory: No respiratory distress. She has no wheezes.  Genitourinary:    Genitourinary Comments: Deferred   Musculoskeletal: Normal range of motion.  Neurological: She is alert.  Skin: Skin is warm.  Psychiatric: She has a normal mood and affect. Her speech is normal and behavior is normal. Thought content normal. Cognition and memory are normal. She expresses impulsivity.    Review of Systems  Constitutional: Negative for chills and fever.  Respiratory: Negative for cough, shortness of breath and wheezing.   Cardiovascular: Negative for chest pain and palpitations.  Gastrointestinal: Negative for heartburn, nausea and vomiting.  Musculoskeletal: Negative for back pain.  Neurological: Negative.  Negative for dizziness and headaches.  Psychiatric/Behavioral: Positive for depression (SWtabilized with medication prior to discharge) and substance abuse (Hx. Amphetamine, Benzodiazepine & THC use disorder). Negative for hallucinations, memory loss and suicidal ideas. The patient has insomnia (Stabilized with medication prior to discharge). The patient is not nervous/anxious (Stable).   All other systems reviewed and are negative.   Blood pressure (!) 127/96, pulse 90, temperature 98.6 F (37 C), temperature source Oral, resp. rate 18, SpO2 (!) 86 %.There is no height or weight on file to calculate BMI.  See Md's discharge SRA   Have you used any form of tobacco in the last 30 days? (Cigarettes, Smokeless Tobacco, Cigars, and/or Pipes): No  Has this patient used any form of tobacco in the last 30 days? (Cigarettes, Smokeless Tobacco, Cigars, and/or Pipes) Yes, No  Blood Alcohol level:  Lab Results  Component Value Date   ETH <10 12/16/2018   ETH 42 (H) 97/94/8016   Metabolic Disorder Labs:  Lab Results  Component Value Date   HGBA1C 5.8 (H) 12/16/2018   MPG 119.76 12/16/2018   MPG 122.63 02/21/2017   No  results found for: PROLACTIN Lab Results  Component Value Date   CHOL 278 (H) 08/18/2015   TRIG 182.0 (H) 08/18/2015   HDL 64.30 08/18/2015   CHOLHDL 4 08/18/2015   VLDL 36.4 08/18/2015   LDLCALC 178 (H) 08/18/2015   LDLCALC 121 (H) 08/01/2013   See Psychiatric Specialty Exam and Suicide Risk Assessment completed by Attending Physician prior to discharge.  Discharge destination:  Home  Is patient on multiple antipsychotic therapies at discharge:  No   Has Patient had three or more failed trials of antipsychotic monotherapy by history:  No  Recommended Plan for Multiple Antipsychotic Therapies: NA  Allergies as of 12/20/2018      Reactions   Aspirin Swelling   Throat swells   Bee Venom Anaphylaxis   Ibuprofen Swelling   Throat swells   Ativan [lorazepam] Other (See Comments)   Agitation/confusion   Ketamine Other (See Comments)   Hallucinations   Toradol [ketorolac Tromethamine] Itching, Swelling, Rash   Tramadol Hcl Itching, Swelling, Rash   Tolerates Dilaudid 06/2016.  TDD.      Medication List    STOP taking these medications   ALPRAZolam 1 MG 24 hr tablet Commonly known as: XANAX XR   ALPRAZolam 1 MG tablet Commonly known as: XANAX   amphetamine-dextroamphetamine 30 MG 24 hr capsule Commonly known as: ADDERALL XR   methocarbamol 500 MG tablet Commonly known as: ROBAXIN   oxyCODONE-acetaminophen 5-325 MG tablet Commonly known as: Percocet   zolpidem 10 MG tablet Commonly known as: AMBIEN     TAKE these medications     Indication  alendronate 35 MG tablet Commonly known as: FOSAMAX Take 1 tablet (35 mg total) by mouth every 7 (seven) days. Take with a full glass of water on an empty stomach.  Indication: Osteoporosis   carbamazepine 200 MG tablet Commonly known as: TEGRETOL Take 1 tablet (200 mg total) by mouth 3 (three) times daily.  Indication: Alcohol Withdrawal Syndrome, Manic-Depression   gabapentin 300 MG capsule Commonly known as:  NEURONTIN Take 1 capsule by mouth 3 (three) times daily.  Indication: Alcohol Withdrawal Syndrome   lisinopril 10 MG tablet Commonly known as: ZESTRIL TAKE 1 AND 1/2 TABLETS DAILY BY MOUTH What changed:   how much to take  how to take this  when to take this  additional instructions  Indication: High Blood Pressure Disorder   meloxicam 15 MG tablet Commonly known as: MOBIC TAKE 1 TABLET (15 MG) BY MOUTH EVERY DAY What changed:   how much to take  how to take this  when to take this  additional instructions  Indication: Joint Damage causing Pain and Loss of Function   QUEtiapine 50 MG tablet Commonly known as: SEROQUEL Take 1-2 tablets (50-100 mg total) by mouth at bedtime.  Indication: Depressive Phase of Manic-Depression   tretinoin 0.1 % cream Commonly known as: RETIN-A Apply 1 application topically at bedtime as needed (facial blemishes).  Indication: Common Acne   valACYclovir 1000 MG tablet Commonly known as: VALTREX Take 2 tablets (2,000 mg total) by mouth 2 (two) times daily.  Indication: Genital Herpes   venlafaxine XR 75 MG 24 hr capsule Commonly known as: EFFEXOR-XR Take 75 mg by mouth daily with breakfast.  Indication: Major Depressive Disorder      Follow-up Information    Chucky May, MD Follow up on 12/23/2018.   Specialty: Psychiatry Why: Virtual medication management appointment is Monday, 9/28 at 12:45p.  Please call office after you discharge to confirn appt and pay your balance.  Contact information: [facility]  [address] [address] [phone]        [facility] [place] Follow up.   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 Please follow up and schedule a therapy appointment with Dr. Tressie Stalker. Contact information: [address] [place], [place], [place] [address]  P: [phone] F:          Follow-up recommendations: Activity:  As  tolerated Diet: As recommended by your primary care doctor. Keep all scheduled follow-up appointments as recommended.   Comments: Prescriptions given at discharge.  Patient agreeable to plan.  Given opportunity to ask questions.  Appears to feel comfortable with discharge denies any current suicidal or homicidal thought. Patient is also instructed prior to discharge to: Take all medications as prescribed by his/her mental healthcare provider. Report any adverse effects and or reactions from the medicines to his/her outpatient provider promptly. Patient has been instructed & cautioned: To not engage in alcohol and or illegal drug use while on prescription medicines. In the event of worsening symptoms, patient is instructed to call the crisis hotline, 911 and or go to the nearest ED for appropriate evaluation and treatment of symptoms. To follow-up with his/her primary care provider for your other medical issues, concerns and or health care needs.  Signed: Lindell Spar, NP, PMHNP, FNP-BC 12/20/2018, 9:36 AM

## 2018-12-20 NOTE — Tx Team (Signed)
Interdisciplinary Treatment and Diagnostic Plan Update  12/20/2018 Time of Session: 10:35am Kara [name] MRN: [id number]  Principal Diagnosis: MDD (major depressive disorder), recurrent, severe, with psychosis (HCC)  Secondary Diagnoses: Principal Problem:   MDD (major depressive disorder), recurrent, severe, with psychosis (HCC)   Current Medications:  Current Facility-Administered Medications  Medication Dose Route Frequency Provider Last Rate Last Dose  . acetaminophen (TYLENOL) tablet 650 mg  650 mg Oral Q6H PRN Antonieta Pert, MD      . alum & mag hydroxide-simeth (MAALOX/MYLANTA) 200-200-20 MG/5ML suspension 30 mL  30 mL Oral Q4H PRN Antonieta Pert, MD      . carbamazepine (TEGRETOL) tablet 200 mg  200 mg Oral TID Malvin Johns, MD   200 mg at 12/20/18 0816  . hydrOXYzine (ATARAX/VISTARIL) tablet 50 mg  50 mg Oral Q6H PRN Antonieta Pert, MD   50 mg at 12/20/18 0343  . magnesium hydroxide (MILK OF MAGNESIA) suspension 30 mL  30 mL Oral Daily PRN Antonieta Pert, MD      . meloxicam [facility]) tablet 15 mg  15 mg Oral Daily Antonieta Pert, MD   15 mg at 12/20/18 0816  . ondansetron (ZOFRAN) tablet 8 mg  8 mg Oral Q8H PRN Antonieta Pert, MD      . traZODone (DESYREL) tablet 50 mg  50 mg Oral QHS PRN Antonieta Pert, MD   50 mg at 12/19/18 2102  . venlafaxine XR (EFFEXOR-XR) 24 hr capsule 75 mg  75 mg Oral Q breakfast Antonieta Pert, MD   75 mg at 12/20/18 9983   PTA Medications: Medications Prior to Admission  Medication Sig Dispense Refill Last Dose  . alendronate (FOSAMAX) 35 MG tablet Take 1 tablet (35 mg total) by mouth every 7 (seven) days. Take with a full glass of water on an empty stomach. (Patient not taking: Reported on 12/16/2018) 12 tablet 0   . ALPRAZolam (XANAX XR) 1 MG 24 hr tablet Take 1 mg by mouth 2 (two) times daily as needed for anxiety.      . ALPRAZolam (XANAX) 1 MG tablet Take 1 mg by mouth 4 (four) times daily as needed for anxiety.      [name] amphetamine-dextroamphetamine (ADDERALL XR) 30 MG 24 hr capsule Take 60 mg by mouth every morning.      . gabapentin (NEURONTIN) 300 MG capsule Take 1 capsule by mouth 3 (three) times daily.     [name] lisinopril (PRINIVIL,ZESTRIL) 10 MG tablet TAKE 1 AND 1/2 TABLETS DAILY BY MOUTH (Patient taking differently: Take 10 mg by mouth daily. ) 45 tablet 1   . meloxicam (MOBIC) 15 MG tablet TAKE 1 TABLET (15 MG) BY MOUTH EVERY DAY (Patient taking differently: Take 15 mg by mouth daily. ) 30 tablet 1   . methocarbamol (ROBAXIN) 500 MG tablet Take 1 tablet (500 mg total) by mouth 2 (two) times daily. (Patient not taking: Reported on 12/16/2018) 10 tablet 0   . oxyCODONE-acetaminophen (PERCOCET) 5-325 MG tablet Take 1 tablet by mouth every 4 (four) hours as needed for severe pain. 20 tablet 0   . tretinoin (RETIN-A) 0.1 % cream Apply 1 application topically at bedtime as needed (facial blemishes).   3   . valACYclovir (VALTREX) 1000 MG tablet Take 2 tablets (2,000 mg total) by mouth 2 (two) times daily. (Patient not taking: Reported on 12/16/2018) 4 tablet 0   . venlafaxine XR (EFFEXOR-XR) 75 MG 24 hr capsule Take 75 mg by mouth daily with  breakfast.     . zolpidem (AMBIEN) 10 MG tablet Take 10 mg by mouth at bedtime.     . [DISCONTINUED] QUEtiapine (SEROQUEL) 50 MG tablet Take 50-100 mg by mouth at bedtime.       Patient Stressors:    Patient Strengths:    Treatment Modalities: Medication Management, Group therapy, Case management,  1 to 1 session with clinician, Psychoeducation, Recreational therapy.   Physician Treatment Plan for Primary Diagnosis: MDD (major depressive disorder), recurrent, severe, with psychosis (HCC) Long Term Goal(s): Improvement in symptoms so as ready for discharge Improvement in symptoms so as ready for discharge   Short Term Goals: Ability to verbalize feelings will improve Ability to demonstrate self-control will improve Ability to maintain clinical measurements within  normal limits will improve Compliance with prescribed medications will improve Ability to identify triggers associated with substance abuse/mental health issues will improve Ability to verbalize feelings will improve Ability to disclose and discuss suicidal ideas Ability to demonstrate self-control will improve Ability to identify and develop effective coping behaviors will improve Ability to maintain clinical measurements within normal limits will improve  Medication Management: Evaluate patient's response, side effects, and tolerance of medication regimen.  Therapeutic Interventions: 1 to 1 sessions, Unit Group sessions and Medication administration.  Evaluation of Outcomes: Adequate for Discharge  Physician Treatment Plan for Secondary Diagnosis: Principal Problem:   MDD (major depressive disorder), recurrent, severe, with psychosis (HCC)  Long Term Goal(s): Improvement in symptoms so as ready for discharge Improvement in symptoms so as ready for discharge   Short Term Goals: Ability to verbalize feelings will improve Ability to demonstrate self-control will improve Ability to maintain clinical measurements within normal limits will improve Compliance with prescribed medications will improve Ability to identify triggers associated with substance abuse/mental health issues will improve Ability to verbalize feelings will improve Ability to disclose and discuss suicidal ideas Ability to demonstrate self-control will improve Ability to identify and develop effective coping behaviors will improve Ability to maintain clinical measurements within normal limits will improve     Medication Management: Evaluate patient's response, side effects, and tolerance of medication regimen.  Therapeutic Interventions: 1 to 1 sessions, Unit Group sessions and Medication administration.  Evaluation of Outcomes: Adequate for Discharge   RN Treatment Plan for Primary Diagnosis: MDD (major  depressive disorder), recurrent, severe, with psychosis (HCC) Long Term Goal(s): Knowledge of disease and therapeutic regimen to maintain health will improve  Short Term Goals: Ability to participate in decision making will improve, Ability to verbalize feelings will improve, Ability to disclose and discuss suicidal ideas, Ability to identify and develop effective coping behaviors will improve and Compliance with prescribed medications will improve  Medication Management: RN will administer medications as ordered by provider, will assess and evaluate patient's response and provide education to patient for prescribed medication. RN will report any adverse and/or side effects to prescribing provider.  Therapeutic Interventions: 1 on 1 counseling sessions, Psychoeducation, Medication administration, Evaluate responses to treatment, Monitor vital signs and CBGs as ordered, Perform/monitor CIWA, COWS, AIMS and Fall Risk screenings as ordered, Perform wound care treatments as ordered.  Evaluation of Outcomes: Adequate for Discharge   LCSW Treatment Plan for Primary Diagnosis: MDD (major depressive disorder), recurrent, severe, with psychosis (HCC) Long Term Goal(s): Safe transition to appropriate next level of care at discharge, Engage patient in therapeutic group addressing interpersonal concerns.  Short Term Goals: Engage patient in aftercare planning with referrals and resources and Increase skills for wellness and recovery  Therapeutic Interventions:  Assess for all discharge needs, 1 to 1 time with Social worker, Explore available resources and support systems, Assess for adequacy in community support network, Educate family and significant other(s) on suicide prevention, Complete Psychosocial Assessment, Interpersonal group therapy.  Evaluation of Outcomes: Adequate for Discharge   Progress in Treatment: Attending groups: No. Participating in groups: No. Taking medication as prescribed:  Yes. Toleration medication: Yes. Family/Significant other contact made: Yes, individual(s) contacted:  pt's husband Patient understands diagnosis: No. Discussing patient identified problems/goals with staff: Yes. Medical problems stabilized or resolved: Yes. Denies suicidal/homicidal ideation: Yes. Issues/concerns per patient self-inventory: No. Other:   New problem(s) identified: No, Describe:  None  New Short Term/Long Term Goal(s): Medication stabilization, elimination of SI thoughts, and development of a comprehensive mental wellness plan.   Patient Goals:  "I do not have any goals"  Discharge Plan or Barriers: Patient discharged home with her husband and will be following up with Dr. Toy Care for her medication management and Dr. Tressie Stalker for therapy.   Reason for Continuation of Hospitalization: Patient will be discharging today.   Estimated Length of Stay: Patient will be discharging today.   Attendees: Patient:  12/20/2018  Physician: Dr. Johnn Hai, MD 12/20/2018   Nursing: Namon Cirri 12/20/2018  RN Care Manager: 12/20/2018   Social Worker: Ardelle Anton, LCSW 12/20/2018   Recreational Therapist:  12/20/2018  MSW Intern: Ovidio Kin 12/20/2018   Other:  12/20/2018   Other: 12/20/2018      Scribe for Treatment Team: Trecia Rogers, LCSW 12/20/2018 11:00 AM

## 2018-12-20 NOTE — Progress Notes (Signed)
Patient ID: Kara [name], female   DOB: 24-Jul-1955, 63 y.o.   MRN: [id number]  Patient was discharged to home/self care in the company of her husband and daughter.  Patient denies SI, HI and AVH upon discharge.  Patient was eager to discharge and acknowledged understanding of discharge information and receipt of personal belongings.

## 2018-12-20 NOTE — Progress Notes (Signed)
Earlier in the evening pt displayed attention seeking behaviors appearing to try to get a shot. Pt stated earlier in the evening" I just want a shot and go to sleep" pt started trying to scream , stating she can not sleep and will not sleep tonight. Pt tried to sit by the phone stating she was not going to sleep. Pt was instructed to go to her room because she does not have a roommate. Pt started pacing in her room screaming at times, but not trying to relax and lay down. After much prompting pt finally laid down . Pt continues needing redirection.
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
## 2018-12-20 NOTE — Progress Notes (Signed)
  [facility] Adult Case Management Discharge Plan :  Will you be returning to the same living situation after discharge:  Yes,  with pt's husband  At discharge, do you have transportation home?: Yes,  with husband  Do you have the ability to pay for your medications: Yes,  pt has medicare   Release of information consent forms completed and in the chart;  Patient's signature needed at discharge.  Patient to Follow up at: Follow-up Information    Chucky May, MD Follow up on 12/23/2018.   Specialty: Psychiatry Why: Virtual medication management appointment is Monday, 9/28 at 12:45p.  Please call office after you discharge to confirn appt and pay your balance.  Contact information: [address] [place] [address] [phone]        [facility] Follow up.   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 Please follow up and schedule a therapy appointment with Dr. Tressie Stalker. Contact information: [address] [place], [place], [place] [address]  P: [phone] F:           Next level of care provider has access to [place] and Suicide Prevention discussed: Yes,  with pt's husband   Have you used any form of tobacco in the last 30 days? (Cigarettes, Smokeless Tobacco, Cigars, and/or Pipes): No  Has patient been referred to the Quitline?: N/A patient is not a smoker  Patient has been referred for addiction treatment: Yes  Billey Chang, Student-Social Work 12/20/2018, 10:33 AM

## 2018-12-20 NOTE — BHH Suicide Risk Assessment (Signed)
[facility] INPATIENT:  Family/Significant Other Suicide Prevention Education  Suicide Prevention Education:  Education Completed; Pt's husband, Kara [name], has been identified by the patient as the family member/significant other with whom the patient will be residing, and identified as the person(s) who will aid the patient in the event of a mental health crisis (suicidal ideations/suicide attempt).  With written consent from the patient, the family member/significant other has been provided the following suicide prevention education, prior to the and/or following the discharge of the patient.  The suicide prevention education provided includes the following:  Suicide risk factors  Suicide prevention and interventions  National Suicide Hotline telephone number  [facility] assessment telephone number  [facility] Emergency Assistance [place] and/or Residential Mobile Crisis Unit telephone number  Request made of family/significant other to:  Remove weapons (e.g., guns, rifles, knives), all items previously/currently identified as safety concern.    Remove drugs/medications (over-the-counter, prescriptions, illicit drugs), all items previously/currently identified as a safety concern.  The family member/significant other verbalizes understanding of the suicide prevention education information provided.  The family member/significant other agrees to remove the items of safety concern listed above.   CSW contacted pt's husband, Kara [name]. Pt's husband stated, "I think it is time for her to come home". Pt's husband did not have any concerns or questions. CSW informed pt's husband that she can discharge around 10:30am.     Trecia Rogers 12/20/2018, 9:33 AM

## 2018-12-20 NOTE — Progress Notes (Signed)
Pt continues to request medication, pt informed that we do not give sleep medication after 2 am. Pt continues to present with med seeking behaviors.

## 2018-12-20 NOTE — Progress Notes (Signed)
Recreation Therapy Notes  INPATIENT RECREATION TR PLAN  Patient Details Name: Senna Lape MRN: [id number] DOB: 20-Jan-1956 Today's Date: 12/20/2018  Rec Therapy Plan Is patient appropriate for Therapeutic Recreation?: Yes Treatment times per week: about 3 days Estimated Length of Stay: 5-7 days TR Treatment/Interventions: Group participation (Comment)  Discharge Criteria Pt will be discharged from therapy if:: Discharged Treatment plan/goals/alternatives discussed and agreed upon by:: Patient/family  Discharge Summary Short term goals set: See patient care plan. Short term goals met: Not met Reason goals not met: Pt is being discharged. Therapeutic equipment acquired: N/A Reason patient discharged from therapy: Discharge from hospital Pt/family agrees with progress & goals achieved: Yes Date patient discharged from therapy: 12/20/18    Victorino Sparrow, LRT/CTRS  Ria Comment, Derral Colucci A 12/20/2018, 10:35 AM

## 2018-12-24 ENCOUNTER — Inpatient Hospital Stay (HOSPITAL_COMMUNITY)
Admission: RE | Admit: 2018-12-24 | Payer: Managed Care, Other (non HMO) | Source: Ambulatory Visit | Admitting: Orthopedic Surgery

## 2018-12-24 ENCOUNTER — Encounter (HOSPITAL_COMMUNITY): Admission: RE | Payer: Self-pay | Source: Ambulatory Visit

## 2018-12-24 SURGERY — ARTHROPLASTY, SHOULDER, TOTAL
Anesthesia: Choice | Laterality: Left

## 2018-12-30 ENCOUNTER — Other Ambulatory Visit (HOSPITAL_COMMUNITY): Payer: 59

## 2019-01-26 IMAGING — CT CT CERVICAL SPINE W/O CM
3 of 4 series · 13 of 33 positions shown, 16 images · non-contrast
Comparison: 01/26/2015 MRI

CLINICAL DATA: Right arm numbness and tingling for 4 weeks.
Pseudoarthrosis.

EXAM:
CT CERVICAL SPINE WITHOUT CONTRAST
TECHNIQUE: Multidetector CT imaging of the cervical spine was performed without
intravenous contrast. Multiplanar CT image reconstructions were also
generated.

[Series 6: cor · coronal · 0.29mm/px · 3 of 66 slices shown]
[im 14/66  bone]
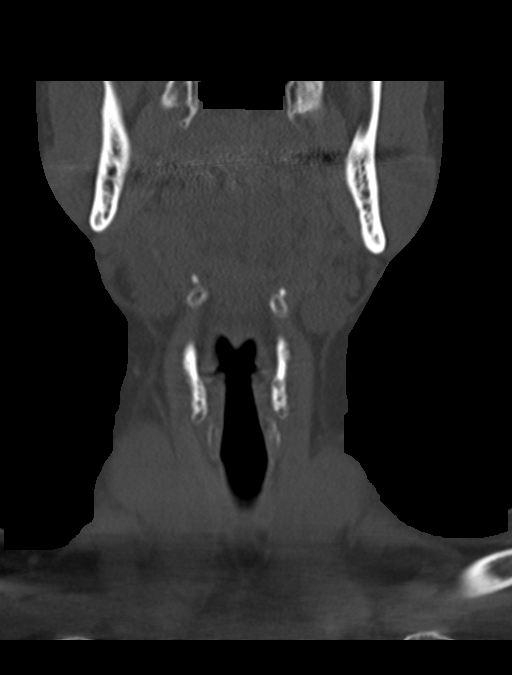
[im 27/66  bone]
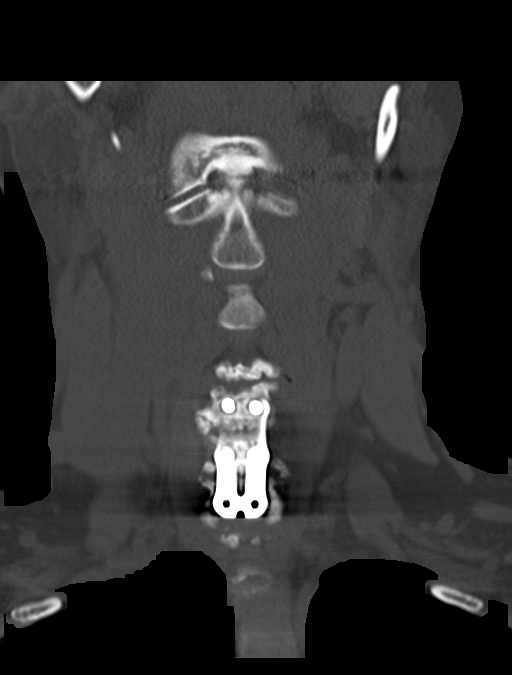
[im 40/66  bone]
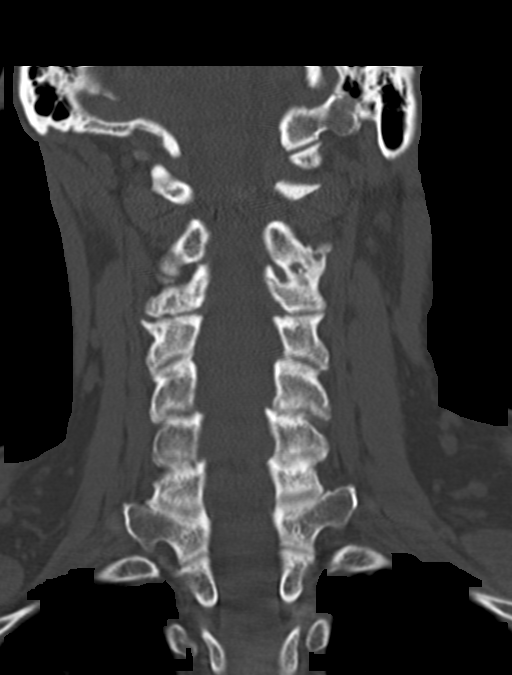

[Series 7: sag · sagittal · 0.30mm/px · 5 of 81 slices shown, 6 images]
[im 27/81  bone]
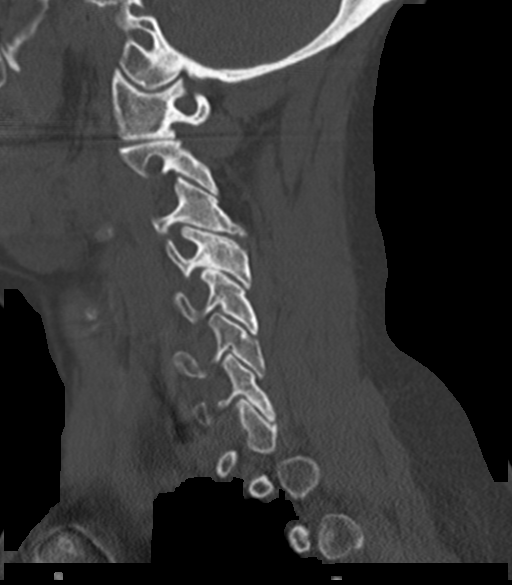
[im 34/81  bone]
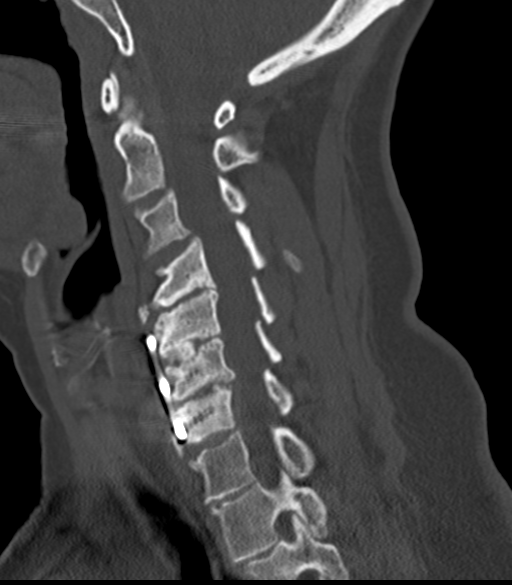
[im 41/81  soft-tissue]
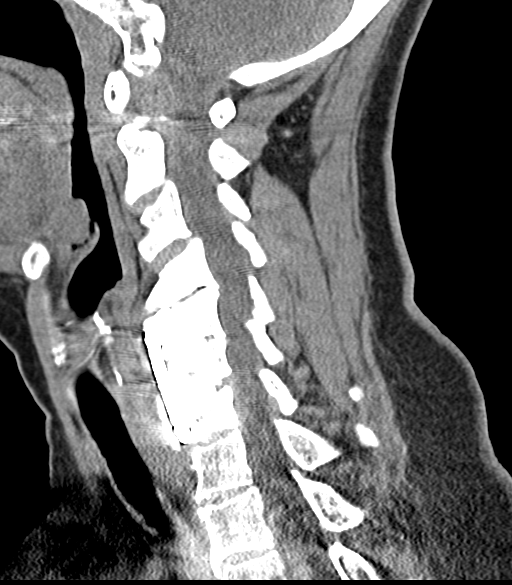
[im 41/81  bone]
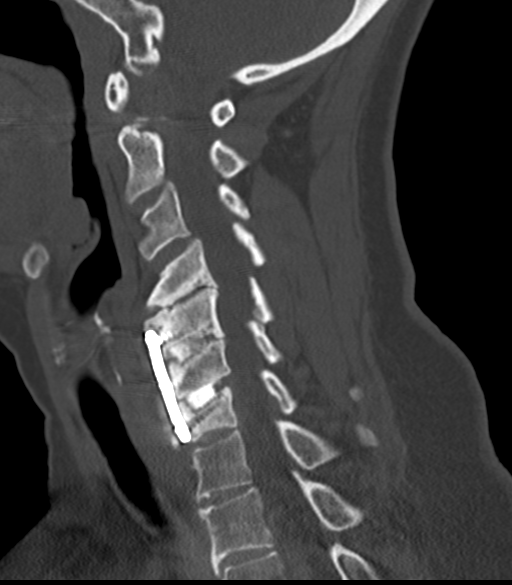
[im 47/81  bone]
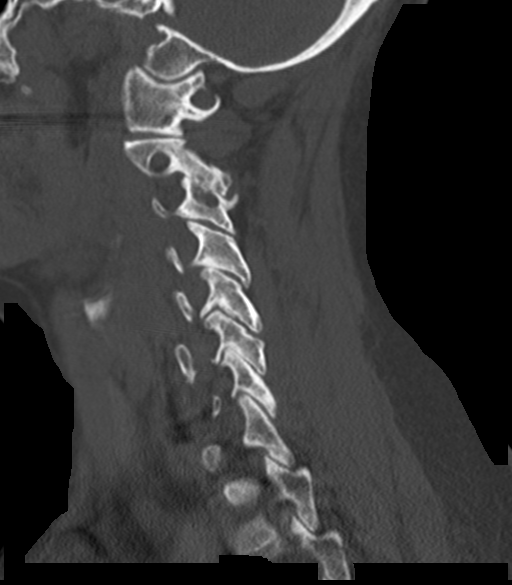
[im 54/81  bone]
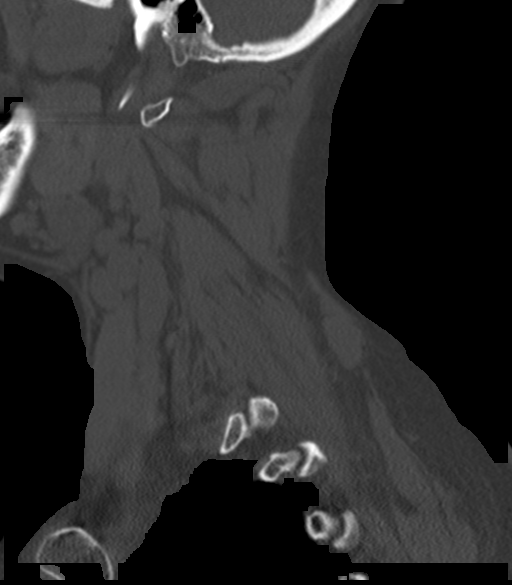

[Series 8: angled axial · axial · 0.29mm/px · z∈[-428,-309]mm · 5 of 90 slices shown, 7 images]
[im 15/90  soft-tissue]
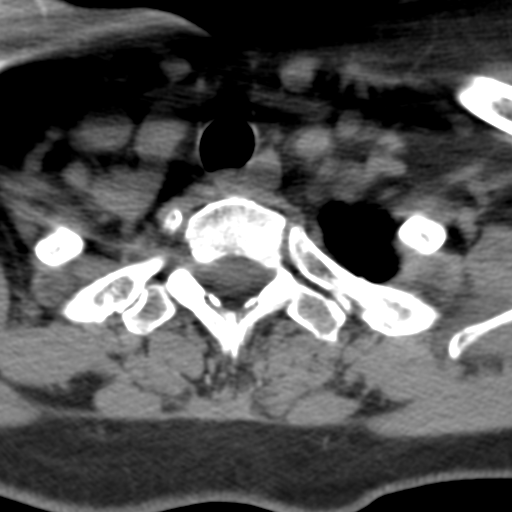
[im 15/90  bone]
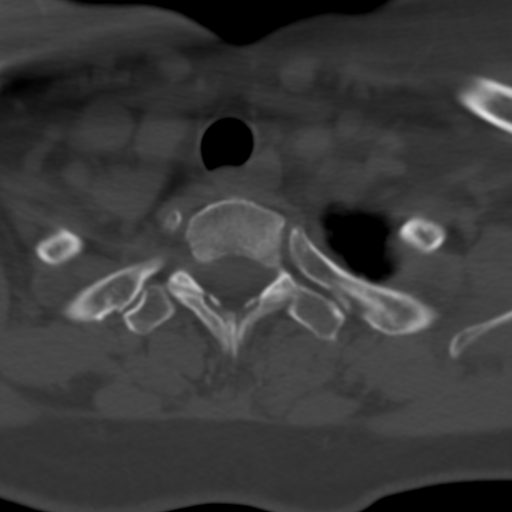
[im 30/90  bone]
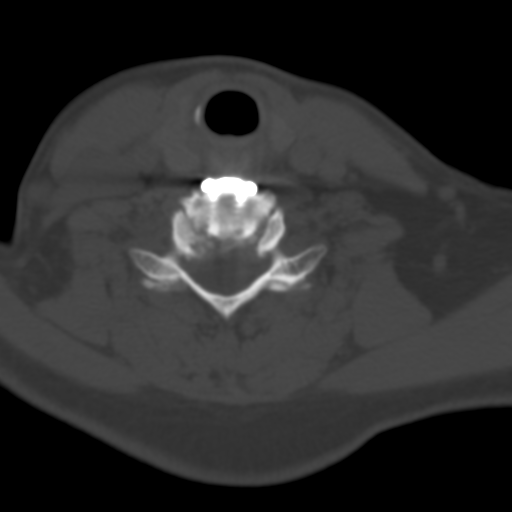
[im 45/90  bone]
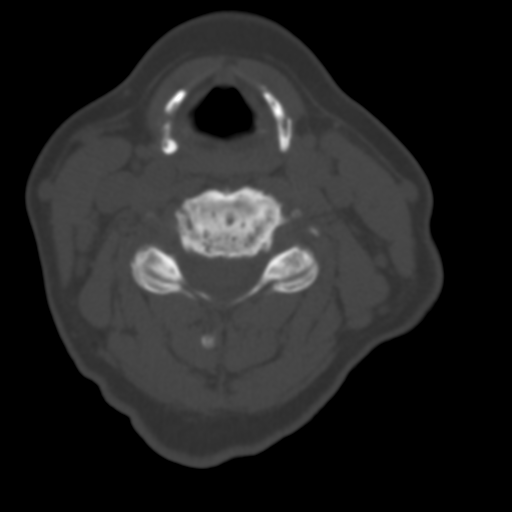
[im 60/90  bone]
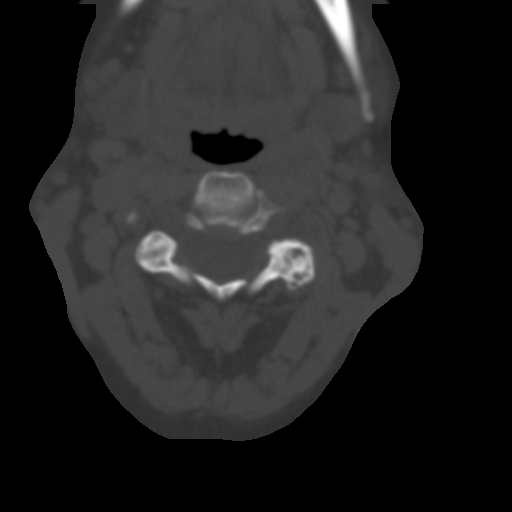
[im 75/90  soft-tissue]
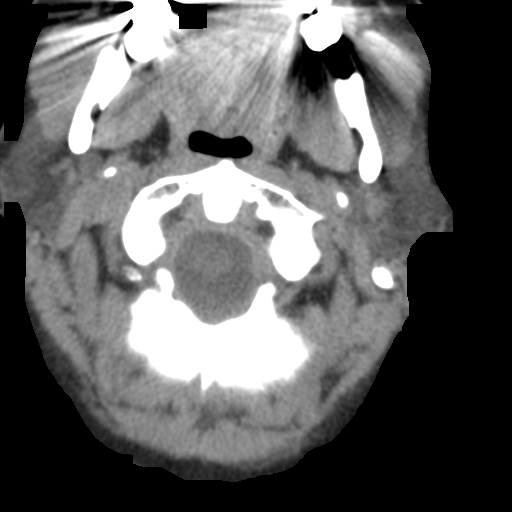
[im 75/90  bone]
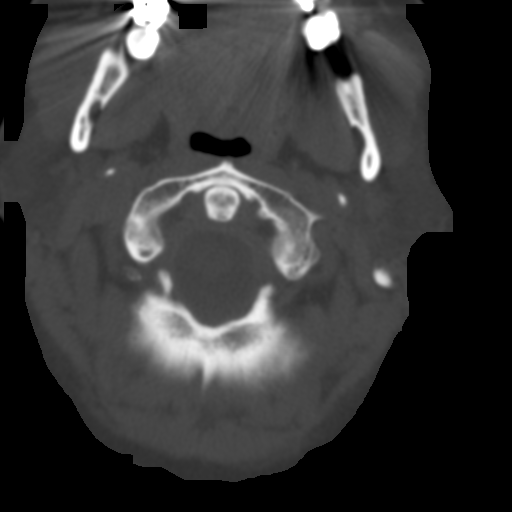

[13 of 33 positions shown; findings below may reference images not displayed]

FINDINGS: Alignment: Straightening of the cervical spine. Mild C3-4
anterolisthesis, facet mediated.

Skull base and vertebrae: Negative for fracture or aggressive bone
lesion. No signs of discitis.

Soft tissues and spinal canal: No inflammation or mass noted.

Disc levels:

C2-3: Facet arthropathy with prominent spurring and joint narrowing
on the left where there is also subchondral cysts. No impingement

C3-4: Facet arthropathy with asymmetric spurring on the right. Mild
right foraminal stenosis. No evidence of canal impingement

C4-5: Advanced disc degeneration with spurring and endplate
irregularity, chronic based on January 2015 MRI. Patent foramina.
Facet spurring asymmetric to the right.

C5-6: Discectomy with solid bony fusion that is most convincing on
coronal reformats, see 6: 32. There is minimal halo around the C5
screws, but they overall appear well seated.

C6-7: Discectomy with ventral plate. There is no convincing bony
fusion and there was interspinous widening on flexion radiography 3
days prior. No suspect screw loosening. Bilateral uncovertebral
ridging and foraminal impingement.

C7-T1:Spondylosis.  No evidence of impingement.

Upper chest: Negative
IMPRESSION: 1. C6-7 ACDF with doubtful bony fusion. This correlates with motion
on flexion-extension radiography a few days prior. No hardware
compromise. Bilateral uncovertebral ridging with foraminal stenosis.
2. C5-6 ACDF with solid bony fusion.
3. Degenerative changes are described above. No evidence of
progression compared to January 2015 MRI.

## 2019-02-25 ENCOUNTER — Other Ambulatory Visit: Payer: Self-pay

## 2019-02-25 DIAGNOSIS — Z20822 Contact with and (suspected) exposure to covid-19: Secondary | ICD-10-CM

## 2019-02-27 LAB — NOVEL CORONAVIRUS, NAA: SARS-CoV-2, NAA: NOT DETECTED

## 2019-05-09 DIAGNOSIS — M19012 Primary osteoarthritis, left shoulder: Secondary | ICD-10-CM | POA: Diagnosis not present

## 2019-07-25 DIAGNOSIS — Z1231 Encounter for screening mammogram for malignant neoplasm of breast: Secondary | ICD-10-CM | POA: Diagnosis not present

## 2019-09-08 DIAGNOSIS — R0789 Other chest pain: Secondary | ICD-10-CM | POA: Diagnosis not present

## 2019-09-08 DIAGNOSIS — R918 Other nonspecific abnormal finding of lung field: Secondary | ICD-10-CM | POA: Diagnosis not present

## 2019-09-08 DIAGNOSIS — R911 Solitary pulmonary nodule: Secondary | ICD-10-CM | POA: Diagnosis not present

## 2019-09-08 DIAGNOSIS — M5412 Radiculopathy, cervical region: Secondary | ICD-10-CM | POA: Diagnosis not present

## 2019-09-08 DIAGNOSIS — J8410 Pulmonary fibrosis, unspecified: Secondary | ICD-10-CM | POA: Diagnosis not present

## 2019-09-08 DIAGNOSIS — R079 Chest pain, unspecified: Secondary | ICD-10-CM | POA: Diagnosis not present

## 2019-09-08 DIAGNOSIS — S199XXA Unspecified injury of neck, initial encounter: Secondary | ICD-10-CM | POA: Diagnosis not present

## 2019-09-08 DIAGNOSIS — R072 Precordial pain: Secondary | ICD-10-CM | POA: Diagnosis not present

## 2019-09-08 DIAGNOSIS — K134 Granuloma and granuloma-like lesions of oral mucosa: Secondary | ICD-10-CM | POA: Diagnosis not present

## 2019-09-19 DIAGNOSIS — M48062 Spinal stenosis, lumbar region with neurogenic claudication: Secondary | ICD-10-CM | POA: Diagnosis not present

## 2019-09-19 DIAGNOSIS — M542 Cervicalgia: Secondary | ICD-10-CM | POA: Diagnosis not present

## 2019-09-19 DIAGNOSIS — M47812 Spondylosis without myelopathy or radiculopathy, cervical region: Secondary | ICD-10-CM | POA: Diagnosis not present

## 2019-10-08 ENCOUNTER — Emergency Department (HOSPITAL_COMMUNITY)
Admission: EM | Admit: 2019-10-08 | Discharge: 2019-10-09 | Disposition: A | Discharge status: Home / Self Care | Payer: 59 | Attending: Emergency Medicine | Admitting: Emergency Medicine

## 2019-10-08 ENCOUNTER — Encounter (HOSPITAL_COMMUNITY): Payer: Self-pay

## 2019-10-08 DIAGNOSIS — R7303 Prediabetes: Secondary | ICD-10-CM | POA: Insufficient documentation

## 2019-10-08 DIAGNOSIS — Z87891 Personal history of nicotine dependence: Secondary | ICD-10-CM | POA: Insufficient documentation

## 2019-10-08 DIAGNOSIS — Z20822 Contact with and (suspected) exposure to covid-19: Secondary | ICD-10-CM | POA: Insufficient documentation

## 2019-10-08 DIAGNOSIS — F4325 Adjustment disorder with mixed disturbance of emotions and conduct: Secondary | ICD-10-CM | POA: Diagnosis present

## 2019-10-08 DIAGNOSIS — F330 Major depressive disorder, recurrent, mild: Secondary | ICD-10-CM | POA: Diagnosis not present

## 2019-10-08 DIAGNOSIS — J45909 Unspecified asthma, uncomplicated: Secondary | ICD-10-CM | POA: Insufficient documentation

## 2019-10-08 DIAGNOSIS — R45851 Suicidal ideations: Secondary | ICD-10-CM | POA: Diagnosis not present

## 2019-10-08 DIAGNOSIS — Z79899 Other long term (current) drug therapy: Secondary | ICD-10-CM | POA: Insufficient documentation

## 2019-10-08 DIAGNOSIS — R451 Restlessness and agitation: Secondary | ICD-10-CM | POA: Diagnosis not present

## 2019-10-08 DIAGNOSIS — I10 Essential (primary) hypertension: Secondary | ICD-10-CM | POA: Diagnosis not present

## 2019-10-08 LAB — CBC
HCT: 39 % (ref 36.0–46.0)
Hemoglobin: 12.9 g/dL (ref 12.0–15.0)
MCH: 29.3 pg (ref 26.0–34.0)
MCHC: 33.1 g/dL (ref 30.0–36.0)
MCV: 88.6 fL (ref 80.0–100.0)
Platelets: 327 10*3/uL (ref 150–400)
RBC: 4.4 MIL/uL (ref 3.87–5.11)
RDW: 12.6 % (ref 11.5–15.5)
WBC: 6 10*3/uL (ref 4.0–10.5)
nRBC: 0 % (ref 0.0–0.2)

## 2019-10-08 LAB — COMPREHENSIVE METABOLIC PANEL
ALT: 14 U/L (ref 0–44)
AST: 15 U/L (ref 15–41)
Albumin: 4 g/dL (ref 3.5–5.0)
Alkaline Phosphatase: 81 U/L (ref 38–126)
Anion gap: 10 (ref 5–15)
BUN: 16 mg/dL (ref 8–23)
CO2: 25 mmol/L (ref 22–32)
Calcium: 9.2 mg/dL (ref 8.9–10.3)
Chloride: 104 mmol/L (ref 98–111)
Creatinine, Ser: 0.83 mg/dL (ref 0.44–1.00)
GFR calc Af Amer: >60 mL/min (ref >60)
GFR calc non Af Amer: >60 mL/min (ref >60)
Glucose, Bld: 96 mg/dL (ref 70–99)
Potassium: 3.5 mmol/L (ref 3.5–5.1)
Sodium: 139 mmol/L (ref 135–145)
Total Bilirubin: 0.3 mg/dL (ref 0.3–1.2)
Total Protein: 7.2 g/dL (ref 6.5–8.1)

## 2019-10-08 LAB — ETHANOL: Alcohol, Ethyl (B): 96 mg/dL — ABNORMAL HIGH (ref <10)

## 2019-10-08 LAB — SARS CORONAVIRUS 2 BY RT PCR (HOSPITAL ORDER, PERFORMED IN ~~LOC~~ HOSPITAL LAB): SARS Coronavirus 2: NEGATIVE

## 2019-10-08 LAB — SALICYLATE LEVEL: Salicylate Lvl: <7 mg/dL — ABNORMAL LOW (ref 7.0–30.0)

## 2019-10-08 LAB — ACETAMINOPHEN LEVEL: Acetaminophen (Tylenol), Serum: <10 ug/mL — ABNORMAL LOW (ref 10–30)

## 2019-10-08 MED ORDER — DIPHENHYDRAMINE HCL 25 MG PO CAPS: 25.0000 mg | ORAL_CAPSULE | Freq: Once | ORAL | Status: DC
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
## 2019-10-08 NOTE — ED Notes (Signed)
Security called for wanding pt

## 2019-10-08 NOTE — ED Notes (Signed)
Pt requesting to leave. PA at bedside at this time.

## 2019-10-08 NOTE — ED Notes (Signed)
Pt has 2 pt belonging bags and one multi color bookbag in the cabinet behind [facility]12 nursing station

## 2019-10-08 NOTE — ED Provider Notes (Addendum)
[facility]-EMERGENCY DEPT Provider Note   CSN: [id number] Arrival date & time: 10/08/19  1815     History Chief Complaint  Patient presents with  . Suicidal    Kara [name] is a 64 y.o. female with pertinent past medical history of ADD, depression with previous suicidal attempts, chronic fatigue, chronic lower back pain, fibromyalgia, PTSD, RA that presents the emergency department today for suicidal ideations.  Patient states that she has been depressed for many years after her husband cheated on her in 2018.  Patient states that she is in the process of divorce, went to [place] today and did see husband and started having immediate thoughts of harming herself.  Denies any homicidal ideation.  Patient states that plan is to crash her car while she is admitted.  Has not overdosed on any medications she told me.  She states that she has not been taking her medications as prescribed for the past 2 weeks, states that she stopped taking her antidepressant 2 weeks ago.  States that she is only been taking her Adderall and her arthritic medications.  Denies any pain anywhere, keeps asking for something to make her fall asleep.  She states that she wants to be here and she will not leave until she gets enough help.  Per chart review her last admission was in September of last year for overdose and suicidal ideations.  Patient states that she supposed to see psychiatrist this week since she has been having these thoughts for the past 3 days, however has not seen psychiatrist in over 4 months. States that she had 2 shots of tequila before coming into the ER, denies any other alcohol or ingestions.  HPI     Past Medical History:  Diagnosis Date  . ADD (attention deficit disorder)    on Adderal  . ADD (attention deficit disorder)   . Aggressive behavior of adult   . Anemia   . Anxiety   . Cellulitis of breast 11/2013   RIGHT BREAST  . Childhood asthma    as child  . Chronic  fatigue and immune dysfunction syndrome (HCC)   . Chronic lower back pain   . Chronic pain    went to [facility] for pain control; stopped in 2016 " (02/23/2017)  . Cold sore   . Complication of anesthesia    Ketamine makes her hallucinate  . 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 Degenerative disc disease, lumbar   . Depression    takes meds daily  . Family history of malignant neoplasm of breast   . Fibromyalgia   . Fibromyalgia   . H/O degenerative disc disease   . History of kidney stones   . Hypertension   . Osteoporosis   . Osteoporosis   . Other specified rheumatoid arthritis, right shoulder (HCC) 08/01/2011  . Pneumonia 2010?  [name] Post-nasal drip    hx of  . Pre-diabetes    patient states she is not Pre-Diabetic at appt. 12/16/2018  . PTSD (post-traumatic stress disorder)   . RA (rheumatoid arthritis) (HCC)    autoimmune arthritis  . RA (rheumatoid arthritis) (HCC)   . Spondylitis (HCC)   . Suicidal intent     Patient Active Problem List   Diagnosis Date Noted  . MDD (major depressive disorder), recurrent, severe, with psychosis (HCC) 12/17/2018  . Osteoarthritis of finger of left hand 10/23/2018  . Lumbar stenosis with neurogenic claudication 06/11/2018  .  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 ADD (attention deficit disorder) 09/02/2017  . 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 Cannabis use disorder, moderate, dependence (HCC) 08/31/2017  . Decreased libido 03/29/2017  . Lumbar stenosis 02/23/2017  . Spinal stenosis of lumbar region with neurogenic claudication 02/19/2017  . S/P lumbar spinal fusion 01/01/2017  . Essential hypertension 07/26/2016  . DNR (do not resuscitate) discussion   . Palliative care by specialist   . Cervical  pseudoarthrosis (HCC) 06/21/2016  . Opioid use disorder, moderate, dependence (HCC) 06/14/2016  . Mitral valve prolapse 01/04/2016  . HNP (herniated nucleus pulposus), lumbar 12/10/2015  . Prediabetes 08/19/2015  . Overweight (BMI 25.0-29.9) 08/18/2015  . Chronic pain 08/18/2015  . Vitamin D deficiency 08/18/2015  . Chronic fatigue disorder 06/14/2015  . S/P cervical spinal fusion 03/15/2015  . PTSD (post-traumatic stress disorder) 03/07/2014  . Suicide threat or attempt 03/06/2014  . Aggressive behavior   . Family history of malignant neoplasm of breast   . Rheumatoid arthritis (HCC) 12/13/2011  . Fibromyalgia 12/13/2011  . H/O cold sores 12/13/2011    Past Surgical History:  Procedure Laterality Date  . ANTERIOR CERVICAL DECOMP/DISCECTOMY FUSION N/A 03/15/2015   Procedure: Cervical five-six, Cerival six-seven, Anterior Cervical Discectomy and Fusion, Allograft and Plate;  Surgeon: Eldred Manges, MD;  Location: [facility];  Service: Orthopedics;  Laterality: N/A;  . ANTERIOR LAT LUMBAR FUSION Left 06/11/2018   Procedure: Left Lumbar two-three Anterolateral decompression/fusion/lateral plate fixation;  Surgeon: Barnett Abu, MD;  Location: [facility] OR;  Service: Neurosurgery;  Laterality: Left;  Left Lumbar two-three Anterolateral decompression/fusion/lateral plate fixation  . AUGMENTATION MAMMAPLASTY  2003  . BACK SURGERY    . BLADDER SUSPENSION  2009  . BONE EXCISION Right 11/26/2018   Procedure: TRAPEZIUM EXCISION RIGHT THUMB;  Surgeon: Cindee Salt, MD;  Location: [facility];  Service: Orthopedics;  Laterality: Right;  AXILLARY BLOCK  . BREAST IMPLANT REMOVAL Bilateral 10/2013  . CARPOMETACARPEL SUSPENSION PLASTY Right 11/26/2018   Procedure: Carolynn Serve SUSPENSION;  Surgeon: Cindee Salt, MD;  Location: [facility];  Service: Orthopedics;  Laterality: Right;  . CERVICAL WOUND DEBRIDEMENT N/A 07/07/2016   Procedure: IRRIGATION AND DEBRIDEMENT POSTERIOR NECK;  Surgeon:  Eldred Manges, MD;  [facility]: [facility];  Service: Orthopedics;  Laterality: N/A;  . COLONOSCOPY    . COMBINED ABDOMINOPLASTY AND LIPOSUCTION  2003  . INCISION AND DRAINAGE ABSCESS Right 01/16/2014   Procedure: INCISION AND DRAINAGE AND OF RIGHT BREAST ABCESS;  Surgeon: Glenna Fellows, MD;  Location: [facility];  Service: General;  Laterality: Right;  . JOINT REPLACEMENT    . LUMBAR LAMINECTOMY/DECOMPRESSION MICRODISCECTOMY N/A 12/10/2015   Procedure: Right L3-4 Hemilaminectomy, Excision of herniated nucleus pulposus;  Surgeon: Eldred Manges, MD;  Location: [facility] OR;  Service: Orthopedics;  Laterality: N/A;  . MASS EXCISION  11/03/2011   Procedure: MINOR EXCISION OF MASS;  Surgeon: Wyn Forster., MD;  Location: [facility];  Service: Orthopedics;  Laterality: Left;  debride IP joint, cyst excision left index  . MAXIMUM ACCESS (MAS) TRANSFORAMINAL LUMBAR INTERBODY FUSION (TLIF) 2 LEVEL Right 02/23/2017  . POSTERIOR CERVICAL FUSION/FORAMINOTOMY N/A 06/21/2016   Procedure: POSTERIOR CERVICAL FUSION C5-C7 SPINOUS PROCESS WIRING;  Surgeon: Eldred Manges, MD;  [facility];  Service: Orthopedics;  Laterality: N/A;  . POSTERIOR LUMBAR FUSION  07/2009; 07/03/2014   "  L4-5; L5-S1"  . SHOULDER ARTHROSCOPY Right 2012  . TENDON TRANSFER Right 11/26/2018   Procedure: TENDON TRANSFER;  Surgeon: Cindee Salt, MD;  Location: [facility];  Service: Orthopedics;  Laterality: Right;  . TONSILLECTOMY  1987  . TOTAL SHOULDER ARTHROPLASTY  08/01/2011   Procedure: TOTAL SHOULDER ARTHROPLASTY;  Surgeon: Eulas Post, MD;  Location: [facility];  Service: Orthopedics;  Laterality: Right;  Right total shoulder arthroplasty  . TUBAL LIGATION  1988     OB History   No obstetric history on file.     Family History  Problem Relation Age of Onset  . Arthritis Mother   . Heart disease Mother        ?psvt  . Breast cancer Mother 53       TAH/BSO  . Cancer Mother   . Mental illness Mother   . COPD  Father   . Hypertension Father   . Alcohol abuse Father   . Mental illness Father   . Heart disease Father   . Healthy Daughter   . Breast cancer Maternal Aunt 27       deceased  . Cancer Cousin 28       female; unknown primary  . Colon cancer Paternal Aunt 3       deceased at 41  . Stomach cancer Paternal Uncle 69       deceased at 74  . Alcohol abuse Brother   . Cancer Brother   . Hodgkin's lymphoma Brother   . Mental illness Brother   . HIV Brother   . Healthy Brother   . Healthy Son     Social History   Tobacco Use  . Smoking status: Former Smoker    Packs/day: 1.00    Years: 10.00    Pack years: 10.00    Types: Cigarettes    Quit date: 12/06/2005    Years since quitting: 13.8  . Smokeless tobacco: Never Used  Vaping Use  . Vaping Use: Never used  Substance Use Topics  . Alcohol use: Yes    Comment: social  . Drug use: Yes    Types: Marijuana    Comment: smoked marjuana yesterday 12/15/2018    Home Medications Prior to Admission medications   Medication Sig Start Date End Date Taking? Authorizing Provider  alendronate (FOSAMAX) 35 MG tablet Take 1 tablet (35 mg total) by mouth every 7 (seven) days. Take with a full glass of water on an empty stomach. Patient not taking: Reported on 12/16/2018 04/04/18   Felix Pacini A, DO  carbamazepine (TEGRETOL) 200 MG tablet Take 1 tablet (200 mg total) by mouth 3 (three) times daily. 12/20/18   Malvin Johns, MD  gabapentin (NEURONTIN) 300 MG capsule Take 1 capsule by mouth 3 (three) times daily. 12/09/18   [provider]  lisinopril (PRINIVIL,ZESTRIL) 10 MG tablet TAKE 1 AND 1/2 TABLETS DAILY BY MOUTH Patient taking differently: Take 10 mg by mouth daily.  03/07/18   Kuneff, Renee A, DO  meloxicam (MOBIC) 15 MG tablet TAKE 1 TABLET (15 MG) BY MOUTH EVERY DAY Patient taking differently: Take 15 mg by mouth daily.  07/06/17   Kerrin Champagne, MD  QUEtiapine (SEROQUEL) 50 MG tablet Take 1-2 tablets (50-100 mg total) by  mouth at bedtime. 12/20/18   Malvin Johns, MD  tretinoin (RETIN-A) 0.1 % cream Apply 1 application topically at bedtime as needed (facial blemishes).  01/28/18   [provider]  valACYclovir (VALTREX) 1000 MG tablet Take 2  tablets (2,000 mg total) by mouth 2 (two) times daily. Patient not taking: Reported on 12/16/2018 07/21/18   Ofilia Neas, PA-C  venlafaxine XR (EFFEXOR-XR) 75 MG 24 hr capsule Take 75 mg by mouth daily with breakfast.    [provider]    Allergies    Aspirin, Bee venom, Ibuprofen, Ativan [lorazepam], Ketamine, Toradol [ketorolac tromethamine], and Tramadol hcl  Review of Systems   Review of Systems  Constitutional: Negative for chills, diaphoresis, fatigue and fever.  HENT: Negative for congestion, sore throat and trouble swallowing.   Eyes: Negative for pain and visual disturbance.  Respiratory: Negative for cough, shortness of breath and wheezing.   Cardiovascular: Negative for chest pain, palpitations and leg swelling.  Gastrointestinal: Negative for abdominal distention, abdominal pain, diarrhea, nausea and vomiting.  Genitourinary: Negative for difficulty urinating.  Musculoskeletal: Negative for back pain, neck pain and neck stiffness.  Skin: Negative for pallor.  Neurological: Negative for dizziness, speech difficulty, weakness and headaches.  Psychiatric/Behavioral: Positive for agitation and suicidal ideas. Negative for behavioral problems, confusion, decreased concentration, dysphoric mood, hallucinations, self-injury and sleep disturbance. The patient is not nervous/anxious and is not hyperactive.     Physical Exam Updated Vital Signs BP (!) 161/97 (BP Location: Left Arm)   Pulse 79   Temp 98.2 F (36.8 C) (Oral)   Resp 20   Ht  (1.626 m)   Wt 67 kg   SpO2 100%   BMI 25.35 kg/m   Physical Exam Constitutional:      General: She is not in acute distress.    Appearance: Normal appearance. She is not ill-appearing,  toxic-appearing or diaphoretic.  HENT:     Mouth/Throat:     Mouth: Mucous membranes are moist.     Pharynx: Oropharynx is clear.  Eyes:     General: No scleral icterus.    Extraocular Movements: Extraocular movements intact.     Pupils: Pupils are equal, round, and reactive to light.  Cardiovascular:     Rate and Rhythm: Normal rate and regular rhythm.     Pulses: Normal pulses.     Heart sounds: Normal heart sounds.  Pulmonary:     Effort: Pulmonary effort is normal. No respiratory distress.     Breath sounds: Normal breath sounds. No stridor. No wheezing, rhonchi or rales.  Chest:     Chest wall: No tenderness.  Abdominal:     General: Abdomen is flat.     Palpations: Abdomen is soft.  Musculoskeletal:        General: No swelling or tenderness. Normal range of motion.     Cervical back: Normal range of motion and neck supple. No rigidity.     Right lower leg: No edema.     Left lower leg: No edema.  Skin:    General: Skin is warm and dry.     Capillary Refill: Capillary refill takes less than 2 seconds.     Coloration: Skin is not pale.  Neurological:     General: No focal deficit present.     Mental Status: She is alert and oriented to person, place, and time.  Psychiatric:     Comments: Patient is agitated keeps talking about how her husband cheated on her with a 400 pound woman.  Keeps pacing around the room, and is also tearful on exam.  No hyperverbal or tangential speech.  Patient is cooperative when asked to be.     ED Results / Procedures / Treatments   Labs (  all labs ordered are listed, but only abnormal results are displayed) Labs Reviewed  ETHANOL - Abnormal; Notable for the following components:      Result Value   Alcohol, Ethyl (B) 96 (*)    All other components within normal limits  SALICYLATE LEVEL - Abnormal; Notable for the following components:   Salicylate Lvl <7.0 (*)    All other components within normal limits  ACETAMINOPHEN LEVEL - Abnormal;  Notable for the following components:   Acetaminophen (Tylenol), Serum <10 (*)    All other components within normal limits  SARS CORONAVIRUS 2 BY RT PCR (HOSPITAL ORDER, PERFORMED IN [facility])  COMPREHENSIVE METABOLIC PANEL  CBC  RAPID URINE DRUG SCREEN, HOSP PERFORMED    EKG None  Radiology No results found.  Procedures Procedures (including critical care time)  Medications Ordered in ED Medications - No data to display  ED Course  I have reviewed the triage vital signs and the nursing notes.  Pertinent labs & imaging results that were available during my care of the patient were reviewed by me and considered in my medical decision making (see chart for details).    MDM Rules/Calculators/A&P                           Aamari West is a 64 y.o. female with pertinent past medical history of ADD, depression with previous suicidal attempts, chronic fatigue, chronic lower back pain, fibromyalgia, PTSD, RA that presents the emergency department today for suicidal ideations.  Denies any homicidal ideations or hallucinations.  Patient has been here before the same before and was admitted last in September of last year.  Patient states that she wants to be here, do not think that IVC is appropriate at this time.  Patient continuously states that she wants to be here and  needs to help.  Patient has been medically cleared and awaiting psych evaluation. Med rec has not been completed yet.    Final Clinical Impression(s) / ED Diagnoses Final diagnoses:  Suicidal ideation    Rx / DC Orders ED Discharge Orders    None          Farrel Gordon, PA-C 10/08/19 2031    Vanetta Mulders, MD 10/08/19 2125

## 2019-10-08 NOTE — ED Notes (Signed)
Pt refused EKG, pt stated "I am asking you nicely to leave me the f*ck alone". RN has been notified.

## 2019-10-08 NOTE — ED Provider Notes (Signed)
°  Physical Exam  BP (!) 161/97 (BP Location: Left Arm)    Pulse 79    Temp 98.2 F (36.8 C) (Oral)    Resp 20    Ht 5\' 4"  (1.626 m)    Wt 67 kg    SpO2 100%    BMI 25.35 kg/m   Physical Exam  ED Course/Procedures     Procedures  MDM  Patient here for suicidal ideation. She is here voluntarily. She is requesting to leave because she wants to be able to sleep, have a room and better food.  I told the patient if she decides to leave her we will need to IVC her.  I will attempt to place her in a room in TCU and give her a sandwich. She currently meets inpatient criteria and will be placed at [facility] tomorrow. She is agreeable to staying voluntarily once she is in a room.       [facility], PA-C 10/08/19 2255    10/10/19, MD 10/09/19 0003

## 2019-10-08 NOTE — BH Assessment (Signed)
Comprehensive Clinical Assessment (CCA) Screening, Triage and Referral Note  10/08/2019 Kara [name] [id number]  Visit Diagnosis:  F33.2, Major depressive disorder, Recurrent episode, Severe   ICD-10-CM   1. Suicidal ideation  R45.851    Kara [name] is a 64 year old patient who voluntarily came to [facility] due to SI with a plan to crash her car as a means of killing herself. Pt states she is currently experiencing, "panic attacks, PTSD because my husband left. I'm at the bottom." Pt denies HI, AVH, NSSIB, access to guns/weapons, engagement with the legal system, or SA. He shares she has been taking her Adderall 30mg  2 pills in the morning and one at night. She told her EDP she has also been taking her arthritis medication but said she stopped taking her anti-depressant 2 weeks ago.  Pt's language was slurred throughout the assessment. Her eyes were partially shut and she was continuously swaying around in the triage chair, at times leaning so far forward that clinician was afraid she was going to fall onto the floor.   Pt's protective factors include no HI or AVH.  Pt is oriented x4; she stated she though she was currently in the city of [place]. Pt's memory was U[place]. Pt was, overall cooperative, though she was difficult to understand at times and her language was slurred. Pt's insight, judgement, and impulse control is impaired at this time.  Patient Reported Information How did you hear about [facility]? Self   Referral name: No data recorded  Referral phone number: No data recorded Whom do you see for routine medical problems? Primary Care   Practice/Facility Name: [facility]   Practice/Facility Phone Number: No data recorded  Name of Contact: Dr. [facility] Number: No data recorded  Contact Fax Number: No data recorded  Prescriber Name: No data recorded  Prescriber Address (if known): No data recorded What Is the Reason for Your Visit/Call Today? No data  recorded How Long Has This Been Causing You Problems? 1 wk - 1 month  Have You Recently Been in Any Inpatient Treatment (Hospital/Detox/Crisis Center/28-Day Program)? Yes   Name/Location of Program/Hospital:[facility]   How Long Were You There? 2020, 2019, 2018   When Were You Discharged? No data recorded Have You Ever Received Services From [facility] Before? Yes   Who Do You See at [facility]? [facility]  Have You Recently Had Any Thoughts About Hurting Yourself? Yes   Are You Planning to Commit Suicide/Harm Yourself At This time?  Yes  Have you Recently Had Thoughts About Hurting Someone [facility]ge Gainer? No   Explanation: No data recorded Have You Used Any Alcohol or Drugs in the Past 24 Hours? Yes   How Long Ago Did You Use Drugs or Alcohol?  No data recorded  What Did You Use and How Much? Adderall - 2 in the morning and 1 at night (30mg  each)  What Do You Feel Would Help You the Most Today? Other (Comment) (Hospitalization)  Do You Currently Have a Therapist/Psychiatrist? Yes   Name of Therapist/Psychiatrist: Karolee Ohs and Dr.   Have You Been Recently Discharged From Any Office Practice or Programs? No   Explanation of Discharge From Practice/Program:  No data recorded    CCA Screening Triage Referral Assessment Type of Contact: Tele-Assessment   Is this Initial or Reassessment? Initial Assessment   Date Telepsych consult ordered in [facility]:  No data recorded  Time Telepsych consult ordered in [facility]:  No data recorded Patient Reported  Information Reviewed? Yes   Patient Left Without Being Seen? No data recorded  Reason for Not Completing Assessment: At 0418, Per [place], NT is sleeping. Pt to be assessed once alert, aroused and able to engage.   Collateral Involvement: No data recorded Does Patient Have a Court Appointed Legal Guardian? No data recorded  Name and Contact of Legal Guardian:  Self  If Minor and Not Living with Parent(s), Who has  Custody? N/A  Is CPS involved or ever been involved? Never  Is APS involved or ever been involved? Never  Patient Determined To Be At Risk for Harm To Self or Others Based on Review of Patient Reported Information or Presenting Complaint? Yes, for Self-Harm   Method: No data recorded  Availability of Means: No data recorded  Intent: No data recorded  Notification Required: No data recorded  Additional Information for Danger to Others Potential:  No data recorded  Additional Comments for Danger to Others Potential:  No data recorded  Are There Guns or Other Weapons in Your Home?  No data recorded   Types of Guns/Weapons: No data recorded   Are These Weapons Safely Secured?                              No data recorded   Who Could Verify You Are Able To Have These Secured:    No data recorded Do You Have any Outstanding Charges, Pending Court Dates, Parole/Probation? No data recorded Contacted To Inform of Risk of Harm To Self or Others: No data recorded Location of Assessment: [facility]  Does Patient Present under Involuntary Commitment? No   IVC Papers Initial File Date: No data recorded  [place] of Residence: [place]  Patient Currently Receiving the Following Services: No data recorded  Determination of Need: No data recorded  Options For Referral: No data recorded  Adaku Anike, NP, reviewed pt's chart and information and determined pt meets inpatient criteria. Pt is currently under review by [facility] for admission 10/09/2019. This information was provided to pt's nurse Shanda Bumps RN at 2135.  Ralph Dowdy, LMFT

## 2019-10-09 DIAGNOSIS — R451 Restlessness and agitation: Secondary | ICD-10-CM | POA: Diagnosis not present

## 2019-10-09 MED ORDER — QUETIAPINE FUMARATE 50 MG PO TABS
50.0000 mg | ORAL_TABLET | Freq: Every day | ORAL | Status: DC
  Administered 2019-10-09: 50 mg via ORAL
  Filled 2019-10-09: qty 1

## 2019-10-09 MED ORDER — VENLAFAXINE HCL 37.5 MG PO TABS
37.5000 mg | ORAL_TABLET | Freq: Every day | ORAL | Status: DC
  Administered 2019-10-09: 37.5 mg via ORAL
  Filled 2019-10-09: qty 1

## 2019-10-09 MED ORDER — TRAZODONE HCL 50 MG PO TABS
50.0000 mg | ORAL_TABLET | Freq: Every day | ORAL | Status: DC
  Administered 2019-10-09: 50 mg via ORAL
  Filled 2019-10-09: qty 1

## 2019-10-09 NOTE — BH Assessment (Signed)
[facility] Assessment Progress Note  Per Berneice Heinrich, FNP, this pt does not require psychiatric hospitalization at this time.  Pt is to be discharged from [facility] with recommendation to continue treatment with Milagros Evener, MD.  This has been included in pt's discharge instructions.  Pt's nurse, Kendal Hymen, has been notified.  Doylene Canning, MA Triage Specialist [phone]

## 2019-10-09 NOTE — ED Notes (Signed)
Second request for urine specimen pt states unable to go at this time.

## 2019-10-09 NOTE — ED Notes (Signed)
Urine specimen requested pt states unable to go right now.

## 2019-10-09 NOTE — Discharge Instructions (Signed)
For your behavioral health needs, you are advised to continue treatment with Rupinder Kaur, MD: ° °     Rupinder Kaur, MD °     [address] °     [place], [place] [address] °     [phone]

## 2019-10-09 NOTE — Consult Note (Signed)
[facility] Psych ED Discharge  10/09/2019 11:46 AM Kara [name]  MRN:  [id number] Principal Problem: Adjustment disorder with mixed disturbance of emotions and conduct Discharge Diagnoses: Principal Problem:   Adjustment disorder with mixed disturbance of emotions and conduct   Subjective:   Patient states "yesterday I had a moment of weakness after meeting with my mom here yesterday I got so upset."  Patient reports plan to follow-up with outpatient psychiatrist, Dr. Lafayette [name], on Saturday. Patient denies suicidal ideations.  Patient denies homicidal ideations.  Patient denies auditory visual hallucinations.  Patient denies symptoms of paranoia. Patient reports appetite and sleep are "fine." Patient reports she resides with a roommate in [place].  Patient denies access to weapons.  Patient reports she currently receives disability benefits.  Patient denies alcohol and substance use. 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 Patient reports she is looking forward to spending time with her granddaughter but realizes that she should allow her attorney to communicate with her ex-husband, rather than communicating with him directly. Patient offered support and encouragement. Patient gives consent to speak with her boyfriend, Kara [name] phone number [phone]. Spoke with patient's boyfriend, Kara [name]; patient's boyfriend denies concerns for patient safety.  Patient's boyfriend denies that patient has access to any weapons.  Patient's boyfriend reports "I think she will be fine, I have been through this before with her."  Total Time spent with patient: 30 minutes  Past Psychiatric History: Adjustment disorder, suicide threat or attempt, PTSD, major depressive disorder without psychotic features, attention deficit disorder  Past Medical History:  Past Medical History:  Diagnosis Date  . ADD (attention deficit disorder)    on Adderal  . ADD (attention deficit  disorder)   . Aggressive behavior of adult   . Anemia   . Anxiety   . Cellulitis of breast 11/2013   RIGHT BREAST  . Childhood asthma    as child  . 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 Chronic lower back pain   . Chronic pain    went to [facility] for pain control; stopped in 2016 " (02/23/2017)  . Cold sore   . Complication of anesthesia    Ketamine makes her hallucinate  . 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 Degenerative disc disease, lumbar   . Depression    takes meds daily  . Family history of malignant neoplasm of breast   . Fibromyalgia   . Fibromyalgia   . H/O degenerative disc disease   . History of kidney stones   . Hypertension   . Osteoporosis   . Osteoporosis   . Other specified rheumatoid arthritis, right shoulder (HCC) 08/01/2011  . Pneumonia 2010?  [name] Post-nasal drip    hx of  . Pre-diabetes    patient states she is not Pre-Diabetic at appt. 12/16/2018  . PTSD (post-traumatic stress disorder)   . RA (rheumatoid arthritis) (HCC)    autoimmune arthritis  . RA (rheumatoid arthritis) (HCC)   . Spondylitis (HCC)   . Suicidal intent     Past Surgical History:  Procedure Laterality Date  . ANTERIOR CERVICAL DECOMP/DISCECTOMY FUSION N/A 03/15/2015   Procedure: Cervical five-six, Cerival six-seven, Anterior Cervical Discectomy and Fusion, Allograft and Plate;  Surgeon: Eldred Manges, MD;  Location: [facility] OR;  Service: Orthopedics;  Laterality: N/A;  . ANTERIOR LAT LUMBAR FUSION Left 06/11/2018   Procedure: Left Lumbar two-three Anterolateral decompression/fusion/lateral plate fixation;  Surgeon: Barnett Abu, MD;  [facility];  Service: Neurosurgery;  Laterality: Left;  Left Lumbar two-three Anterolateral decompression/fusion/lateral plate fixation  . AUGMENTATION MAMMAPLASTY  2003  . BACK SURGERY    . BLADDER SUSPENSION  2009  . BONE EXCISION Right 11/26/2018   Procedure: TRAPEZIUM EXCISION  RIGHT THUMB;  Surgeon: Cindee Salt, MD;  Location: [facility];  Service: Orthopedics;  Laterality: Right;  AXILLARY BLOCK  . BREAST IMPLANT REMOVAL Bilateral 10/2013  . CARPOMETACARPEL SUSPENSION PLASTY Right 11/26/2018   Procedure: Carolynn Serve SUSPENSION;  Surgeon: Cindee Salt, MD;  Location: [facility];  Service: Orthopedics;  Laterality: Right;  . CERVICAL WOUND DEBRIDEMENT N/A 07/07/2016   Procedure: IRRIGATION AND DEBRIDEMENT POSTERIOR NECK;  Surgeon: Eldred Manges, MD;  Location: [facility] OR;  Service: Orthopedics;  Laterality: N/A;  . COLONOSCOPY    . COMBINED ABDOMINOPLASTY AND LIPOSUCTION  2003  . INCISION AND DRAINAGE ABSCESS Right 01/16/2014   Procedure: INCISION AND DRAINAGE AND OF RIGHT BREAST ABCESS;  Surgeon: Glenna Fellows, MD;  Location: [facility];  Service: General;  Laterality: Right;  . JOINT REPLACEMENT    . LUMBAR LAMINECTOMY/DECOMPRESSION MICRODISCECTOMY N/A 12/10/2015   Procedure: Right L3-4 Hemilaminectomy, Excision of herniated nucleus pulposus;  Surgeon: Eldred Manges, MD;  Location: [facility] OR;  Service: Orthopedics;  Laterality: N/A;  . MASS EXCISION  11/03/2011   Procedure: MINOR EXCISION OF MASS;  Surgeon: Wyn Forster., MD;  Location: [facility];  Service: Orthopedics;  Laterality: Left;  debride IP joint, cyst excision left index  . MAXIMUM ACCESS (MAS) TRANSFORAMINAL LUMBAR INTERBODY FUSION (TLIF) 2 LEVEL Right 02/23/2017  . POSTERIOR CERVICAL FUSION/FORAMINOTOMY N/A 06/21/2016   Procedure: POSTERIOR CERVICAL FUSION C5-C7 SPINOUS PROCESS WIRING;  Surgeon: Eldred Manges, MD;  Location: [facility];  Service: Orthopedics;  Laterality: N/A;  . POSTERIOR LUMBAR FUSION  07/2009; 07/03/2014   "L4-5; L5-S1"  . SHOULDER ARTHROSCOPY Right 2012  . TENDON TRANSFER Right 11/26/2018   Procedure: TENDON TRANSFER;  Surgeon: Cindee Salt, MD;  Location: [facility];  Service: Orthopedics;  Laterality: Right;  . TONSILLECTOMY  1987  . TOTAL  SHOULDER ARTHROPLASTY  08/01/2011   Procedure: TOTAL SHOULDER ARTHROPLASTY;  Surgeon: Eulas Post, MD;  Location: [facility] OR;  Service: Orthopedics;  Laterality: Right;  Right total shoulder arthroplasty  . TUBAL LIGATION  1988   Family History:  Family History  Problem Relation Age of Onset  . Arthritis Mother   . Heart disease Mother        ?psvt  . Breast cancer Mother 61       TAH/BSO  . Cancer Mother   . Mental illness Mother   . COPD Father   . Hypertension Father   . Alcohol abuse Father   . Mental illness Father   . Heart disease Father   . Healthy Daughter   . Breast cancer Maternal Aunt 27       deceased  . Cancer Cousin 88       female; unknown primary  . Colon cancer Paternal Aunt 58       deceased at 62  . Stomach cancer Paternal Uncle 60       deceased at 80  . Alcohol abuse Brother   . Cancer Brother   . Hodgkin's lymphoma Brother   . Mental illness Brother   . HIV Brother   . Healthy Brother   . Healthy Son    Family Psychiatric  History: None  reported Social History:  Social History   Substance and Sexual Activity  Alcohol Use Yes   Comment: social     Social History   Substance and Sexual Activity  Drug Use Yes  . Types: Marijuana   Comment: smoked marjuana yesterday 12/15/2018    Social History   Socioeconomic History  . Marital status: Married    Spouse name: Not on file  . Number of children: Not on file  . Years of education: Not on file  . Highest education level: Not on file  Occupational History  . Not on file  Tobacco Use  . Smoking status: Former Smoker    Packs/day: 1.00    Years: 10.00    Pack years: 10.00    Types: Cigarettes    Quit date: 12/06/2005    Years since quitting: 13.8  . Smokeless tobacco: Never Used  Vaping Use  . Vaping Use: Never used  Substance and Sexual Activity  . Alcohol use: Yes    Comment: social  . Drug use: Yes    Types: Marijuana    Comment: smoked marjuana yesterday 12/15/2018  . Sexual  activity: Yes    Birth control/protection: Post-menopausal  Other Topics Concern  . Not on file  Social History Narrative   Married, Pier Laux.    6 children.    [employer], Retired Child psychotherapist.    Denies alcohol, tobacco or drug use.   Drinks caffeinated beverages. Uses herbal remedies. Take a daily vitamin.   Wears her seatbelt. Exercises greater than 3 times a week.   Smoke detector in the home, firearms in the home in a locked cabinet, feels safe in her relationships.   Social Determinants of Health   Financial Resource Strain:   . Difficulty of Paying Living Expenses:   Food Insecurity:   . Worried About Programme researcher, broadcasting/film/video in the Last Year:   . Barista in the Last Year:   Transportation Needs:   . Freight forwarder (Medical):   [name] Lack of Transportation (Non-Medical):   Physical Activity:   . Days of Exercise per Week:   . Minutes of Exercise per Session:   Stress:   . Feeling of Stress :   Social Connections:   . Frequency of Communication with Friends and Family:   . Frequency of Social Gatherings with Friends and Family:   . Attends Religious Services:   . Active Member of Clubs or Organizations:   . Attends Banker Meetings:   [name] Marital Status:     Has this patient used any form of tobacco in the last 30 days? (Cigarettes, Smokeless Tobacco, Cigars, and/or Pipes) A prescription for an FDA-approved tobacco cessation medication was offered at discharge and the patient refused  Current Medications: Current Facility-Administered Medications  Medication Dose Route Frequency Provider Last Rate Last Admin  . QUEtiapine (SEROQUEL) tablet 50-100 mg  50-100 mg Oral QHS Cardama, Amadeo Garnet, MD   50 mg at 10/09/19 0217  . traZODone (DESYREL) tablet 50 mg  50 mg Oral QHS Nira Conn, MD   50 mg at 10/09/19 0217  . venlafaxine (EFFEXOR) tablet 37.5 mg  37.5 mg Oral Daily Cardama, Amadeo Garnet, MD   37.5 mg at 10/09/19 1053   Current  Outpatient Medications  Medication Sig Dispense Refill  . ALPRAZolam (XANAX) 1 MG tablet Take 1 mg by mouth daily as needed for anxiety.     [name] amphetamine-dextroamphetamine (ADDERALL XR) 30 MG 24 hr capsule  Take 60 mg by mouth every morning.    [name] amphetamine-dextroamphetamine (ADDERALL) 30 MG tablet Take 30 mg by mouth daily. At 4 pm    . cyclobenzaprine (FLEXERIL) 10 MG tablet Take 10 mg by mouth 2 (two) times daily as needed for muscle spasms.     . meloxicam (MOBIC) 15 MG tablet TAKE 1 TABLET (15 MG) BY MOUTH EVERY DAY (Patient taking differently: Take 15 mg by mouth daily. ) 30 tablet 1  . PREMPRO 0.3-1.5 MG tablet Take 1 tablet by mouth daily.    . QUEtiapine (SEROQUEL) 50 MG tablet Take 1-2 tablets (50-100 mg total) by mouth at bedtime. 60 tablet 2  . traZODone (DESYREL) 50 MG tablet Take 50 mg by mouth at bedtime.    . tretinoin (RETIN-A) 0.1 % cream Apply 1 application topically at bedtime as needed (facial blemishes).   3  . valACYclovir (VALTREX) 1000 MG tablet Take 2 tablets (2,000 mg total) by mouth 2 (two) times daily. 4 tablet 0  . venlafaxine (EFFEXOR) 37.5 MG tablet Take 37.5 mg by mouth daily.    [name] venlafaxine (EFFEXOR) 75 MG tablet Take 75 mg by mouth daily. Only takes when feels more anxious.    [name] alendronate (FOSAMAX) 35 MG tablet Take 1 tablet (35 mg total) by mouth every 7 (seven) days. Take with a full glass of water on an empty stomach. (Patient not taking: Reported on 12/16/2018) 12 tablet 0  . carbamazepine (TEGRETOL) 200 MG tablet Take 1 tablet (200 mg total) by mouth 3 (three) times daily. (Patient not taking: Reported on 10/08/2019) 90 tablet 2  . lisinopril (PRINIVIL,ZESTRIL) 10 MG tablet TAKE 1 AND 1/2 TABLETS DAILY BY MOUTH (Patient not taking: Reported on 10/08/2019) 45 tablet 1   PTA Medications: (Not in a hospital admission)   Musculoskeletal: Strength & Muscle Tone: within normal limits Gait & Station: normal Patient leans: N/A  Psychiatric Specialty  Exam: Physical Exam Vitals and nursing note reviewed.  Constitutional:      Appearance: She is well-developed.  HENT:     Head: Normocephalic.  Cardiovascular:     Rate and Rhythm: Normal rate.  Pulmonary:     Effort: Pulmonary effort is normal.  Neurological:     Mental Status: She is alert and oriented to person, place, and time.  Psychiatric:        Mood and Affect: Mood normal.        Behavior: Behavior normal.        Thought Content: Thought content normal.        Judgment: Judgment normal.     Review of Systems  Constitutional: Negative.   HENT: Negative.   Eyes: Negative.   Respiratory: Negative.   Cardiovascular: Negative.   Gastrointestinal: Negative.   Genitourinary: Negative.   Musculoskeletal: Negative.   Skin: Negative.   Neurological: Negative.   Psychiatric/Behavioral: Negative.     Blood pressure 97/65, pulse 65, temperature (!) 97.5 F (36.4 C), temperature source Oral, resp. rate 18, height 5\' 4"  (1.626 m), weight 67 kg, SpO2 92 %.Body mass index is 25.35 kg/m.  General Appearance: Casual and Fairly Groomed  Eye Contact:  Good  Speech:  Clear and Coherent and Normal Rate  Volume:  Normal  Mood:  Euthymic  Affect:  Appropriate and Congruent  Thought Process:  Coherent, Goal Directed and Descriptions of Associations: Intact  Orientation:  Full (Time, Place, and Person)  Thought Content:  WDL and Logical  Suicidal Thoughts:  No  Homicidal Thoughts:  No  Memory:  Immediate;   Good Recent;   Good Remote;   Good  Judgement:  Fair  Insight:  Good  Psychomotor Activity:  Normal  Concentration:  Concentration: Good and Attention Span: Good  Recall:  Good  Fund of Knowledge:  Good  Language:  Good  Akathisia:  No  Handed:  Right  AIMS (if indicated):     Assets:  Communication Skills Desire for Improvement Financial Resources/Insurance Housing Intimacy Leisure Time Physical Health Resilience Social Support  ADL's:  Intact  Cognition:  WNL   Sleep:        Demographic Factors:  Caucasian  Loss Factors: NA  Historical Factors: NA  Risk Reduction Factors:   Living with another person, especially a relative, Positive social support, Positive therapeutic relationship and Positive coping skills or problem solving skills  Continued Clinical Symptoms:    Cognitive Features That Contribute To Risk:  None    Suicide Risk:  Minimal: No identifiable suicidal ideation.  Patients presenting with no risk factors but with morbid ruminations; may be classified as minimal risk based on the severity of the depressive symptoms    Plan Of Care/Follow-up recommendations:  Other:  Patient reviewed with Dr. Lucianne Muss.   Patient verbalizes plan to follow-up with outpatient psychiatrist, Dr. [name].  Disposition: Discharge Patrcia Dolly, FNP 10/09/2019, 11:46 AM

## 2019-10-10 DIAGNOSIS — M4802 Spinal stenosis, cervical region: Secondary | ICD-10-CM | POA: Diagnosis not present

## 2019-10-10 DIAGNOSIS — M50123 Cervical disc disorder at C6-C7 level with radiculopathy: Secondary | ICD-10-CM | POA: Diagnosis not present

## 2019-10-10 DIAGNOSIS — M5136 Other intervertebral disc degeneration, lumbar region: Secondary | ICD-10-CM | POA: Diagnosis not present

## 2019-10-16 DIAGNOSIS — M545 Low back pain: Secondary | ICD-10-CM | POA: Diagnosis not present

## 2019-10-16 DIAGNOSIS — M47812 Spondylosis without myelopathy or radiculopathy, cervical region: Secondary | ICD-10-CM | POA: Diagnosis not present

## 2019-10-18 DIAGNOSIS — Z79899 Other long term (current) drug therapy: Secondary | ICD-10-CM | POA: Diagnosis not present

## 2019-10-18 DIAGNOSIS — N390 Urinary tract infection, site not specified: Secondary | ICD-10-CM | POA: Diagnosis not present

## 2019-10-18 DIAGNOSIS — I4891 Unspecified atrial fibrillation: Secondary | ICD-10-CM | POA: Diagnosis not present

## 2019-10-18 DIAGNOSIS — R4781 Slurred speech: Secondary | ICD-10-CM | POA: Diagnosis not present

## 2019-10-18 DIAGNOSIS — Z6379 Other stressful life events affecting family and household: Secondary | ICD-10-CM | POA: Diagnosis not present

## 2019-10-18 DIAGNOSIS — T424X1A Poisoning by benzodiazepines, accidental (unintentional), initial encounter: Secondary | ICD-10-CM | POA: Diagnosis not present

## 2019-10-18 DIAGNOSIS — R0902 Hypoxemia: Secondary | ICD-10-CM | POA: Diagnosis not present

## 2019-10-20 DIAGNOSIS — N390 Urinary tract infection, site not specified: Secondary | ICD-10-CM | POA: Diagnosis not present

## 2020-01-14 DIAGNOSIS — Z79899 Other long term (current) drug therapy: Secondary | ICD-10-CM | POA: Diagnosis not present

## 2020-01-14 DIAGNOSIS — F32A Depression, unspecified: Secondary | ICD-10-CM | POA: Diagnosis not present

## 2020-03-18 DIAGNOSIS — Z9151 Personal history of suicidal behavior: Secondary | ICD-10-CM | POA: Diagnosis not present

## 2020-03-18 DIAGNOSIS — Z635 Disruption of family by separation and divorce: Secondary | ICD-10-CM | POA: Diagnosis not present

## 2020-03-18 DIAGNOSIS — Z751 Person awaiting admission to adequate facility elsewhere: Secondary | ICD-10-CM | POA: Diagnosis not present

## 2020-03-18 DIAGNOSIS — Z79899 Other long term (current) drug therapy: Secondary | ICD-10-CM | POA: Diagnosis not present

## 2020-03-18 DIAGNOSIS — R451 Restlessness and agitation: Secondary | ICD-10-CM | POA: Diagnosis not present

## 2020-03-23 DIAGNOSIS — Z0289 Encounter for other administrative examinations: Secondary | ICD-10-CM | POA: Diagnosis not present

## 2020-03-23 DIAGNOSIS — F32A Depression, unspecified: Secondary | ICD-10-CM | POA: Diagnosis not present

## 2020-03-23 DIAGNOSIS — M539 Dorsopathy, unspecified: Secondary | ICD-10-CM | POA: Diagnosis not present

## 2020-03-23 DIAGNOSIS — E559 Vitamin D deficiency, unspecified: Secondary | ICD-10-CM | POA: Diagnosis not present

## 2020-03-23 DIAGNOSIS — M797 Fibromyalgia: Secondary | ICD-10-CM | POA: Diagnosis not present

## 2020-05-13 DIAGNOSIS — H52223 Regular astigmatism, bilateral: Secondary | ICD-10-CM | POA: Diagnosis not present

## 2020-05-13 DIAGNOSIS — H5203 Hypermetropia, bilateral: Secondary | ICD-10-CM | POA: Diagnosis not present

## 2020-05-13 DIAGNOSIS — H524 Presbyopia: Secondary | ICD-10-CM | POA: Diagnosis not present

## 2020-05-13 DIAGNOSIS — H25043 Posterior subcapsular polar age-related cataract, bilateral: Secondary | ICD-10-CM | POA: Diagnosis not present

## 2021-02-01 DIAGNOSIS — Z20822 Contact with and (suspected) exposure to covid-19: Secondary | ICD-10-CM | POA: Diagnosis not present

## 2021-02-01 DIAGNOSIS — Z79899 Other long term (current) drug therapy: Secondary | ICD-10-CM | POA: Diagnosis not present

## 2021-02-01 DIAGNOSIS — Z888 Allergy status to other drugs, medicaments and biological substances status: Secondary | ICD-10-CM | POA: Diagnosis not present

## 2021-02-01 DIAGNOSIS — Z87891 Personal history of nicotine dependence: Secondary | ICD-10-CM | POA: Diagnosis not present

## 2021-02-01 DIAGNOSIS — F32A Depression, unspecified: Secondary | ICD-10-CM | POA: Diagnosis not present

## 2021-02-01 DIAGNOSIS — M069 Rheumatoid arthritis, unspecified: Secondary | ICD-10-CM | POA: Diagnosis not present

## 2021-02-01 DIAGNOSIS — Z791 Long term (current) use of non-steroidal anti-inflammatories (NSAID): Secondary | ICD-10-CM | POA: Diagnosis not present

## 2021-02-01 DIAGNOSIS — Z91030 Bee allergy status: Secondary | ICD-10-CM | POA: Diagnosis not present

## 2021-02-01 DIAGNOSIS — Z886 Allergy status to analgesic agent status: Secondary | ICD-10-CM | POA: Diagnosis not present

## 2021-02-03 DIAGNOSIS — Z20822 Contact with and (suspected) exposure to covid-19: Secondary | ICD-10-CM | POA: Diagnosis not present

## 2021-02-03 DIAGNOSIS — F32A Depression, unspecified: Secondary | ICD-10-CM | POA: Diagnosis not present

## 2021-02-04 DIAGNOSIS — Z743 Need for continuous supervision: Secondary | ICD-10-CM | POA: Diagnosis not present

## 2021-02-04 DIAGNOSIS — I499 Cardiac arrhythmia, unspecified: Secondary | ICD-10-CM | POA: Diagnosis not present

## 2021-02-04 DIAGNOSIS — G4489 Other headache syndrome: Secondary | ICD-10-CM | POA: Diagnosis not present

## 2021-02-04 DIAGNOSIS — R6889 Other general symptoms and signs: Secondary | ICD-10-CM | POA: Diagnosis not present

## 2021-02-04 DIAGNOSIS — R079 Chest pain, unspecified: Secondary | ICD-10-CM | POA: Diagnosis not present

## 2022-03-01 DIAGNOSIS — E559 Vitamin D deficiency, unspecified: Secondary | ICD-10-CM | POA: Diagnosis not present

## 2022-03-01 DIAGNOSIS — Z13220 Encounter for screening for lipoid disorders: Secondary | ICD-10-CM | POA: Diagnosis not present

## 2022-03-01 DIAGNOSIS — H5213 Myopia, bilateral: Secondary | ICD-10-CM | POA: Diagnosis not present

## 2022-03-01 DIAGNOSIS — R7989 Other specified abnormal findings of blood chemistry: Secondary | ICD-10-CM | POA: Diagnosis not present

## 2022-03-01 DIAGNOSIS — H40013 Open angle with borderline findings, low risk, bilateral: Secondary | ICD-10-CM | POA: Diagnosis not present

## 2022-03-01 DIAGNOSIS — H25813 Combined forms of age-related cataract, bilateral: Secondary | ICD-10-CM | POA: Diagnosis not present

## 2022-03-01 DIAGNOSIS — Z118 Encounter for screening for other infectious and parasitic diseases: Secondary | ICD-10-CM | POA: Diagnosis not present

## 2022-03-01 DIAGNOSIS — R7309 Other abnormal glucose: Secondary | ICD-10-CM | POA: Diagnosis not present

## 2022-03-01 DIAGNOSIS — H524 Presbyopia: Secondary | ICD-10-CM | POA: Diagnosis not present

## 2022-03-07 ENCOUNTER — Other Ambulatory Visit: Payer: Self-pay

## 2022-03-07 ENCOUNTER — Encounter (HOSPITAL_BASED_OUTPATIENT_CLINIC_OR_DEPARTMENT_OTHER): Payer: Self-pay | Admitting: Emergency Medicine

## 2022-03-07 ENCOUNTER — Emergency Department (HOSPITAL_BASED_OUTPATIENT_CLINIC_OR_DEPARTMENT_OTHER)
Admission: EM | Admit: 2022-03-07 | Discharge: 2022-03-07 | Disposition: A | Discharge status: Home / Self Care | Payer: Medicare Other | Attending: Emergency Medicine | Admitting: Emergency Medicine

## 2022-03-07 ENCOUNTER — Ambulatory Visit
Admission: EM | Admit: 2022-03-07 | Discharge: 2022-03-07 | Disposition: A | Discharge status: Home / Self Care | Payer: Medicare Other

## 2022-03-07 ENCOUNTER — Emergency Department (HOSPITAL_BASED_OUTPATIENT_CLINIC_OR_DEPARTMENT_OTHER): Payer: Medicare Other

## 2022-03-07 DIAGNOSIS — H5712 Ocular pain, left eye: Secondary | ICD-10-CM

## 2022-03-07 DIAGNOSIS — H53142 Visual discomfort, left eye: Secondary | ICD-10-CM | POA: Diagnosis not present

## 2022-03-07 DIAGNOSIS — L03213 Periorbital cellulitis: Secondary | ICD-10-CM | POA: Insufficient documentation

## 2022-03-07 DIAGNOSIS — H5789 Other specified disorders of eye and adnexa: Secondary | ICD-10-CM | POA: Diagnosis present

## 2022-03-07 LAB — CBC WITH DIFFERENTIAL/PLATELET
Abs Immature Granulocytes: 0.02 10*3/uL (ref 0.00–0.07)
Basophils Absolute: 0.1 10*3/uL (ref 0.0–0.1)
Basophils Relative: 1 %
Eosinophils Absolute: 0.1 10*3/uL (ref 0.0–0.5)
Eosinophils Relative: 1 %
HCT: 40.7 % (ref 36.0–46.0)
Hemoglobin: 13.5 g/dL (ref 12.0–15.0)
Immature Granulocytes: 0 %
Lymphocytes Relative: 24 %
Lymphs Abs: 1.9 10*3/uL (ref 0.7–4.0)
MCH: 29.3 pg (ref 26.0–34.0)
MCHC: 33.2 g/dL (ref 30.0–36.0)
MCV: 88.5 fL (ref 80.0–100.0)
Monocytes Absolute: 0.5 10*3/uL (ref 0.1–1.0)
Monocytes Relative: 6 %
Neutro Abs: 5.4 10*3/uL (ref 1.7–7.7)
Neutrophils Relative %: 68 %
Platelets: 318 10*3/uL (ref 150–400)
RBC: 4.6 MIL/uL (ref 3.87–5.11)
RDW: 13.3 % (ref 11.5–15.5)
WBC: 7.9 10*3/uL (ref 4.0–10.5)
nRBC: 0 % (ref 0.0–0.2)

## 2022-03-07 LAB — BASIC METABOLIC PANEL
Anion gap: 9 (ref 5–15)
BUN: 17 mg/dL (ref 8–23)
CO2: 26 mmol/L (ref 22–32)
Calcium: 9.2 mg/dL (ref 8.9–10.3)
Chloride: 103 mmol/L (ref 98–111)
Creatinine, Ser: 0.76 mg/dL (ref 0.44–1.00)
GFR, Estimated: >60 mL/min (ref >60)
Glucose, Bld: 89 mg/dL (ref 70–99)
Potassium: 3.8 mmol/L (ref 3.5–5.1)
Sodium: 138 mmol/L (ref 135–145)

## 2022-03-07 LAB — SEDIMENTATION RATE: Sed Rate: 12 mm/hr (ref 0–22)

## 2022-03-07 LAB — C-REACTIVE PROTEIN: CRP: <0.5 mg/dL (ref <1.0)

## 2022-03-07 MED ORDER — IOHEXOL 300 MG/ML  SOLN
75.0000 mL | Freq: Once | INTRAMUSCULAR | Status: AC | PRN
  Administered 2022-03-07: 75 mL via INTRAVENOUS

## 2022-03-07 MED ORDER — SODIUM CHLORIDE 0.9 % IV SOLN
1.0000 g | Freq: Once | INTRAVENOUS | Status: AC
  Administered 2022-03-07: 1 g via INTRAVENOUS
  Filled 2022-03-07: qty 10

## 2022-03-07 MED ORDER — CEFDINIR 300 MG PO CAPS: 300.0000 mg | ORAL_CAPSULE | Freq: Two times a day (BID) | ORAL | 0 refills | Status: AC

## 2022-03-07 MED ORDER — TETRACAINE HCL 0.5 % OP SOLN
2.0000 [drp] | Freq: Once | OPHTHALMIC | Status: AC
  Administered 2022-03-07: 2 [drp] via OPHTHALMIC
  Filled 2022-03-07: qty 4

## 2022-03-07 MED ORDER — FLUORESCEIN SODIUM 1 MG OP STRP
1.0000 | ORAL_STRIP | Freq: Once | OPHTHALMIC | Status: AC
  Administered 2022-03-07: 1 via OPHTHALMIC
  Filled 2022-03-07: qty 1

## 2022-03-07 NOTE — ED Provider Notes (Signed)
Patient presents to urgent care complaining of pain behind her left eye, decreased vision in her left eye and swelling of her left eye and eyelid.  On limited physical exam, patient has bulging of her left eye with significant injection of the sclera, soft tissue surrounding her eye and eyelids appear erythematous and swollen as well.  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  Patient states she was just seen by her ophthalmologist yesterday but none of the symptoms were present at that time.  Patient advised to go now to the emergency room for further evaluation.  Patient agreed to go now.  Patient accompanied by her husband today who states he will drive her.   Theadora Rama Scales, PA-C 03/07/22 1524

## 2022-03-07 NOTE — ED Notes (Signed)
Patient is being discharged from the Urgent Care and sent to the Emergency Department via POV . Per L. Morgan-Scales PA-C , patient is in need of higher level of care due to need for further evaluation . Patient is aware and verbalizes understanding of plan of care.  Vitals:   03/07/22 1508  BP: (!) 161/99  Pulse: 82  Resp: 16  Temp: 98.7 F (37.1 C)  SpO2: 96%

## 2022-03-07 NOTE — ED Notes (Signed)
Pt now returned from CT dept via w/c - no acute changes or distress noted.

## 2022-03-07 NOTE — ED Provider Notes (Signed)
[facility] EMERGENCY DEPT Provider Note   CSN: [id number] Arrival date & time: 03/07/22  1610     History  Chief Complaint  Patient presents with   Eye Pain    Kara [name] is a 66 y.o. female presenting to the ED today due to left eye generalized pain, swelling, and painful movements.  States recently saw ophthalmologist 2 days ago, was told there was "issues with the chamber emptying fluid" and was told she likely needs surgery soon.  Has an appointment scheduled in January for likely surgical planning.  However the next morning (yesterday) she woke up with pain, redness, and swelling around the eye.  Noticed double and blurry vision.  Reports pain and pressure from behind the eye.  Symptoms have continued to worsen to today.  Tried to see her ophthalmologist, Dr. Lucina Mellow of [facility], however their office was closed around 1 PM today.  Denies fevers, chills, recent URI symptoms, difficulty swallowing, itching, or new medications/face creams, though does note some tearing from the eye.  The history is provided by the patient and medical records.  Eye Pain    Home Medications Prior to Admission medications   Medication Sig Start Date End Date Taking? Authorizing Provider  alendronate (FOSAMAX) 35 MG tablet Take 1 tablet (35 mg total) by mouth every 7 (seven) days. Take with a full glass of water on an empty stomach. Patient not taking: Reported on 12/16/2018 04/04/18   Felix Pacini A, DO  ALPRAZolam Prudy Feeler) 1 MG tablet Take 1 mg by mouth daily as needed for anxiety.  10/07/19   [provider]  amphetamine-dextroamphetamine (ADDERALL XR) 30 MG 24 hr capsule Take 60 mg by mouth every morning. 09/30/19   [provider]  amphetamine-dextroamphetamine (ADDERALL) 30 MG tablet Take 30 mg by mouth daily. At 4 pm 09/30/19   [provider]  carbamazepine (TEGRETOL) 200 MG tablet Take 1 tablet (200 mg total) by mouth 3 (three) times daily. Patient not taking:  Reported on 10/08/2019 12/20/18   Malvin Johns, MD  cyclobenzaprine (FLEXERIL) 10 MG tablet Take 10 mg by mouth 2 (two) times daily as needed for muscle spasms.  09/25/19   [provider]  lisinopril (PRINIVIL,ZESTRIL) 10 MG tablet TAKE 1 AND 1/2 TABLETS DAILY BY MOUTH Patient not taking: Reported on 10/08/2019 03/07/18   Claiborne Billings, Renee A, DO  meloxicam (MOBIC) 15 MG tablet TAKE 1 TABLET (15 MG) BY MOUTH EVERY DAY Patient taking differently: Take 15 mg by mouth daily.  07/06/17   Kerrin Champagne, MD  PREMPRO 0.3-1.5 MG tablet Take 1 tablet by mouth daily. 09/22/19   [provider]  QUEtiapine (SEROQUEL) 50 MG tablet Take 1-2 tablets (50-100 mg total) by mouth at bedtime. 12/20/18   Malvin Johns, MD  traZODone (DESYREL) 50 MG tablet Take 50 mg by mouth at bedtime.    [provider]  tretinoin (RETIN-A) 0.1 % cream Apply 1 application topically at bedtime as needed (facial blemishes).  01/28/18   [provider]  valACYclovir (VALTREX) 1000 MG tablet Take 2 tablets (2,000 mg total) by mouth 2 (two) times daily. 07/21/18   Ofilia Neas, PA-C  venlafaxine (EFFEXOR) 37.5 MG tablet Take 37.5 mg by mouth daily.    [provider]  venlafaxine (EFFEXOR) 75 MG tablet Take 75 mg by mouth daily. Only takes when feels more anxious.    [provider]      Allergies    Aspirin, Bee venom, Ibuprofen, Ativan [lorazepam], Ketamine, Toradol [  ketorolac tromethamine], and Tramadol hcl    Review of Systems   Review of Systems  Eyes:  Positive for pain.    Physical Exam Updated Vital Signs BP (!) 135/90   Pulse 72   Temp 98 F (36.7 C)   Resp 18   Ht 5\' 4"  (1.626 m)   Wt 68.9 kg   SpO2 98%   BMI 26.09 kg/m  Physical Exam Vitals and nursing note reviewed.  Constitutional:      General: She is not in acute distress.    Appearance: She is well-developed. She is not ill-appearing, toxic-appearing or diaphoretic.  HENT:     Head: Normocephalic and  atraumatic. Left periorbital erythema present.  Eyes:     General: Gaze aligned appropriately.     Intraocular pressure: Right eye pressure is 12 mmHg. Left eye pressure is 10 mmHg.     Conjunctiva/sclera: Conjunctivae normal.     Pupils:     Right eye: Pupil is round and not sluggish. No corneal abrasion or fluorescein uptake.     Left eye: Pupil is round and not sluggish. No corneal abrasion or fluorescein uptake.     Slit lamp exam:    Right eye: No corneal ulcer, foreign body, hyphema or hypopyon.     Left eye: Photophobia (Mild) present. No corneal ulcer, foreign body, hyphema or hypopyon.     Visual Fields: Right eye visual fields normal and left eye visual fields normal.     Comments: EOMs grossly intact though reportedly painful of left eye.  Perilimbic sparing of left eye injection.  Erythema, swelling, and tenderness of periorbital region, most localized to inferior orbit.  No hypopyon, hordeolum, exudate, discharge, foreign body, or hemorrhage.  No blood appreciated in the chamber.  No significant fluorescein uptake with Woods lamp exam.  Overall unremarkable slit-lamp exam.  Mild increased left pupil size by 1 mm.  Right eye with contact lens in place.   Neck:     Comments: No meningismus or torticollis Cardiovascular:     Rate and Rhythm: Normal rate and regular rhythm.     Heart sounds: No murmur heard. Pulmonary:     Effort: Pulmonary effort is normal. No respiratory distress.     Breath sounds: Normal breath sounds.  Abdominal:     Palpations: Abdomen is soft.     Tenderness: There is no abdominal tenderness.  Musculoskeletal:        General: No swelling.     Cervical back: Neck supple. No rigidity.  Skin:    General: Skin is warm and dry.     Capillary Refill: Capillary refill takes less than 2 seconds.  Neurological:     Mental Status: She is alert and oriented to person, place, and time.  Psychiatric:        Mood and Affect: Mood normal.    ED Results /  Procedures / Treatments   Labs (all labs ordered are listed, but only abnormal results are displayed) Labs Reviewed  CBC WITH DIFFERENTIAL/PLATELET  BASIC METABOLIC PANEL  SEDIMENTATION RATE  C-REACTIVE PROTEIN   EKG None  Radiology No results found.  Procedures Procedures    Medications Ordered in ED Medications  tetracaine (PONTOCAINE) 0.5 % ophthalmic solution 2 drop (2 drops Left Eye Given 03/07/22 1735)  fluorescein ophthalmic strip 1 strip (1 strip Left Eye Given 03/07/22 1735)    ED Course/ Medical Decision Making/ A&P Clinical Course as of 03/07/22 1923  Tue Mar 07, 2022  1847 Consulted with Mar 09, 2022  of ophthalmology.  Discussed general findings of patient's case.  Patient states her ophthalmologist is Dr. Lucina Mellow, this was relayed to Dr. Charlotte Sanes.  They recommended to consult with patient's ophthalmologist, and stated there should be one on-call. [AC]  1900 Further investigation of online resources indicate Dr. Lucina Mellow is optometrist.  Information shared with oncoming provider.   [AC]  1904 Stable 66 YOF with a chief complaint of redness swelling and pain to the left orbit. Symptoms worsening over the past 48 hours. Seen by PCP.  Told she likely had  Left 9/10 Right 12  Orbital cellulitis.  [CC]    Clinical Course User Index [AC] Cecil Cobbs, PA-C [CC] Glyn Ade, MD                           Medical Decision Making Amount and/or Complexity of Data Reviewed Labs: ordered. Radiology: ordered.  Risk Prescription drug management.   66 y.o. female presents to the ED for concern of Eye Pain     This involves an extensive number of treatment options, and is a complaint that carries with it a high risk of complications and morbidity.  The emergent differential diagnosis prior to evaluation includes, but is not limited to: Orbital cellulitis, periorbital cellulitis, open angle-closure glaucoma  This is not an exhaustive differential.   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 Social Determinants of Health include: Elderly  Additional History:  None, unable to see optometrist notes.  Lab Tests: Pending  Imaging Studies: Pending  ED Course / Critical Interventions: Pt overall well-appearing on exam.  Reporting left eye pain with painful eye movements, swelling, erythema, and pressure directly behind the eye.  Seen by her optometrist Dr. Lucina Mellow 2 days ago, was without the symptoms at this time.  Woke up with the symptoms the morning after.  Afebrile.  Nontoxic, nonseptic appearing.  Physical exam not suggestive of hordeolum, hypopyon, retrobulbar hematoma, foreign body.  No recent trauma.  No eye discharge.  Without evidence of elevated intraocular pressure, left 10 and 9, right 12 and 12.  Lower suspicion for acute angle-closure glaucoma.  More suspicious of orbital cellulitis.  EOMs preserved though painful at this time.  Plan to proceed with further imaging, may consult with ophthalmology depending on results.  Disposition: 1904 care of Calea Hribar transferred to Dr. Doran Durand at the end of my shift as the patient will require reassessment once labs/imaging have resulted.  Patient presentation, ED course, and plan of care discussed with review of all pertinent labs and imaging.  Please see his/her note for further details regarding further ED course and disposition.   Plan at time of handoff is continue workup for orbital cellulitis. May reconsult ophthalmology as needed.  This may be altered or completely changed at the discretion of the oncoming team pending results of further workup.   This chart was dictated using voice recognition software.  Despite best efforts to proofread, errors can occur which can change the documentation meaning.         Final Clinical Impression(s) / ED Diagnoses Final diagnoses:  None    Rx / DC Orders ED Discharge Orders      None         Sandrea Hammond 03/07/22 Shawn Route, MD 03/07/22 2201

## 2022-03-07 NOTE — ED Notes (Signed)
Pt agreeable with d/c plan as discussed by provider- this nurse has verbally reinforced d/c instructions and provided pt with written copy.  Pt acknowledges verbal understanding and denies any addl questions, concerns, needs- pt ambulatory independently at d/c with steady gait; vitals stable; no distress.   

## 2022-03-07 NOTE — ED Notes (Signed)
Pt provided ginger ale by ED tech as pt has requested and educated on clear liquid diet restrictions for now per Dr. Doran Durand

## 2022-03-07 NOTE — ED Notes (Signed)
Patient transported to CT via w/c escorted by Rad Tech   

## 2022-03-07 NOTE — ED Notes (Signed)
Pt awake and alert sitting up in bed in dimly lit room.  GCS 15.  L eye periorbital edema noted with mild watery drainage. Erythematous sclera.  Pt reports 8/10 ongoing L eye pain - states pain feels as if it is coming from posterior L eye and is described as pressure. Denies pain radiation; denies precipitating or other associated factors.  Pt hesitant to have bloodwork re-collected; reports bloodwork collected 3 days ago and "should be in your computer".  After explaining that changes could have occurred since last lab collection and different lab orders from what was collected recently.  Pt agreeable then with labs.  While at bedside pt also asking can she eat; will f/u with provider and advise.  Pt denies any addl questions concerns needs at this time.

## 2022-03-07 NOTE — ED Triage Notes (Addendum)
Pt states she noticed swelling to the left eye, and pain to the back of the eye. Pt states she was told she had cataracts at her most recent vision appt.   Started: yesterday.   Home interventions: none

## 2022-03-07 NOTE — ED Triage Notes (Signed)
Pt from home with high pressure to left eye since yesterday. Denies injury or trauma to eye. Pt states she awakened with swelling and redness to the eye. Pt alert & oriented, nad noted.

## 2022-03-08 DIAGNOSIS — H4020X4 Unspecified primary angle-closure glaucoma, indeterminate stage: Secondary | ICD-10-CM | POA: Diagnosis not present

## 2022-03-13 DIAGNOSIS — H4020X4 Unspecified primary angle-closure glaucoma, indeterminate stage: Secondary | ICD-10-CM | POA: Diagnosis not present

## 2022-03-13 DIAGNOSIS — H2512 Age-related nuclear cataract, left eye: Secondary | ICD-10-CM | POA: Diagnosis not present

## 2022-03-13 DIAGNOSIS — H269 Unspecified cataract: Secondary | ICD-10-CM | POA: Diagnosis not present

## 2022-03-15 DIAGNOSIS — Z0000 Encounter for general adult medical examination without abnormal findings: Secondary | ICD-10-CM | POA: Diagnosis not present

## 2022-03-15 DIAGNOSIS — M797 Fibromyalgia: Secondary | ICD-10-CM | POA: Diagnosis not present

## 2022-03-15 DIAGNOSIS — M159 Polyosteoarthritis, unspecified: Secondary | ICD-10-CM | POA: Diagnosis not present

## 2022-03-15 DIAGNOSIS — N390 Urinary tract infection, site not specified: Secondary | ICD-10-CM | POA: Diagnosis not present

## 2022-03-15 DIAGNOSIS — M0579 Rheumatoid arthritis with rheumatoid factor of multiple sites without organ or systems involvement: Secondary | ICD-10-CM | POA: Diagnosis not present

## 2022-03-17 DIAGNOSIS — Z0130 Encounter for examination of blood pressure without abnormal findings: Secondary | ICD-10-CM | POA: Diagnosis not present

## 2022-03-24 DIAGNOSIS — H2511 Age-related nuclear cataract, right eye: Secondary | ICD-10-CM | POA: Diagnosis not present

## 2022-03-24 DIAGNOSIS — H269 Unspecified cataract: Secondary | ICD-10-CM | POA: Diagnosis not present

## 2022-03-29 ENCOUNTER — Ambulatory Visit: Payer: Medicare Other | Admitting: Emergency Medicine

## 2022-04-25 DIAGNOSIS — Z1231 Encounter for screening mammogram for malignant neoplasm of breast: Secondary | ICD-10-CM | POA: Diagnosis not present

## 2022-05-16 DIAGNOSIS — L570 Actinic keratosis: Secondary | ICD-10-CM | POA: Diagnosis not present

## 2022-05-16 DIAGNOSIS — D2239 Melanocytic nevi of other parts of face: Secondary | ICD-10-CM | POA: Diagnosis not present

## 2022-05-16 DIAGNOSIS — D225 Melanocytic nevi of trunk: Secondary | ICD-10-CM | POA: Diagnosis not present

## 2022-05-16 DIAGNOSIS — X32XXXA Exposure to sunlight, initial encounter: Secondary | ICD-10-CM | POA: Diagnosis not present

## 2022-05-16 DIAGNOSIS — L821 Other seborrheic keratosis: Secondary | ICD-10-CM | POA: Diagnosis not present

## 2022-06-09 DIAGNOSIS — R109 Unspecified abdominal pain: Secondary | ICD-10-CM | POA: Diagnosis not present

## 2022-06-14 DIAGNOSIS — R5382 Chronic fatigue, unspecified: Secondary | ICD-10-CM | POA: Diagnosis not present

## 2022-08-28 DIAGNOSIS — M87051 Idiopathic aseptic necrosis of right femur: Secondary | ICD-10-CM | POA: Diagnosis not present

## 2022-08-28 DIAGNOSIS — M545 Low back pain, unspecified: Secondary | ICD-10-CM | POA: Diagnosis not present

## 2022-09-04 DIAGNOSIS — Z01818 Encounter for other preprocedural examination: Secondary | ICD-10-CM | POA: Diagnosis not present

## 2022-09-04 DIAGNOSIS — R7989 Other specified abnormal findings of blood chemistry: Secondary | ICD-10-CM | POA: Diagnosis not present

## 2022-09-04 DIAGNOSIS — R7309 Other abnormal glucose: Secondary | ICD-10-CM | POA: Diagnosis not present

## 2022-09-04 DIAGNOSIS — Z79899 Other long term (current) drug therapy: Secondary | ICD-10-CM | POA: Diagnosis not present

## 2022-09-05 DIAGNOSIS — M1611 Unilateral primary osteoarthritis, right hip: Secondary | ICD-10-CM | POA: Diagnosis not present

## 2022-09-05 DIAGNOSIS — R0981 Nasal congestion: Secondary | ICD-10-CM | POA: Diagnosis not present

## 2022-09-05 DIAGNOSIS — D649 Anemia, unspecified: Secondary | ICD-10-CM | POA: Diagnosis not present

## 2022-09-05 DIAGNOSIS — I10 Essential (primary) hypertension: Secondary | ICD-10-CM | POA: Diagnosis not present

## 2022-09-05 DIAGNOSIS — Z01810 Encounter for preprocedural cardiovascular examination: Secondary | ICD-10-CM | POA: Diagnosis not present

## 2022-09-06 DIAGNOSIS — B028 Zoster with other complications: Secondary | ICD-10-CM | POA: Diagnosis not present

## 2022-09-11 DIAGNOSIS — D72829 Elevated white blood cell count, unspecified: Secondary | ICD-10-CM | POA: Diagnosis not present

## 2022-09-12 ENCOUNTER — Emergency Department (HOSPITAL_COMMUNITY): Payer: Medicare Other

## 2022-09-12 ENCOUNTER — Emergency Department (HOSPITAL_COMMUNITY)
Admission: EM | Admit: 2022-09-12 | Discharge: 2022-09-12 | Disposition: A | Discharge status: Home / Self Care | Payer: Medicare Other | Attending: Emergency Medicine | Admitting: Emergency Medicine

## 2022-09-12 ENCOUNTER — Encounter (HOSPITAL_COMMUNITY): Payer: Self-pay

## 2022-09-12 ENCOUNTER — Other Ambulatory Visit: Payer: Self-pay

## 2022-09-12 DIAGNOSIS — M25551 Pain in right hip: Secondary | ICD-10-CM | POA: Insufficient documentation

## 2022-09-12 MED ORDER — HYDROCODONE-ACETAMINOPHEN 5-325 MG PO TABS: 1.0000 | ORAL_TABLET | Freq: Four times a day (QID) | ORAL | 0 refills | Status: DC | PRN

## 2022-09-12 MED ORDER — MORPHINE SULFATE (PF) 2 MG/ML IV SOLN
2.0000 mg | Freq: Once | INTRAVENOUS | Status: AC
  Administered 2022-09-12: 2 mg via INTRAMUSCULAR
  Filled 2022-09-12: qty 1

## 2022-09-12 NOTE — ED Notes (Signed)
Patient stated she drove here. Instructed she could not drive after medication. Patient called and spoke with her husband [name] who stated he would come and drive her home.

## 2022-09-12 NOTE — ED Provider Notes (Signed)
EMERGENCY DEPARTMENT AT [facility] Provider Note   CSN: [id number] Arrival date & time: 09/12/22  0844     History  Chief Complaint  Patient presents with   Hip Pain    Kara [name] is a 67 y.o. female.  Patient with history of fibromyalgia and RA presents to the ED with right hip pain for 3 months. Patient describes her pain as "sharp/stabbing" that radiates to her anterior leg down to her ankle and rates it 10/10. Patient states she was in [place] and a provider gave her 3 weeks of prednisone. Patient reports she had to cut her [place] trip short due to the worsening hip pain. Patient states she saw her orthopedic provider, Dr. Dion Saucier, who is recommending a hip replacement. Patient denies fever, chills, numbness/tingling or weakness.  Patient is here today because the pain has become "unbearable" and she is hoping that an emergent hip replacement may be arranged.  HPI     Home Medications Prior to Admission medications   Medication Sig Start Date End Date Taking? Authorizing Provider  HYDROcodone-acetaminophen (NORCO/VICODIN) 5-325 MG tablet Take 1 tablet by mouth every 6 (six) hours as needed. 09/12/22  Yes Darrick Grinder, PA-C  alendronate (FOSAMAX) 35 MG tablet Take 1 tablet (35 mg total) by mouth every 7 (seven) days. Take with a full glass of water on an empty stomach. Patient not taking: Reported on 12/16/2018 04/04/18   Felix Pacini A, DO  ALPRAZolam Prudy Feeler) 1 MG tablet Take 1 mg by mouth daily as needed for anxiety.  10/07/19   [provider]  amphetamine-dextroamphetamine (ADDERALL XR) 30 MG 24 hr capsule Take 60 mg by mouth every morning. 09/30/19   [provider]  amphetamine-dextroamphetamine (ADDERALL) 30 MG tablet Take 30 mg by mouth daily. At 4 pm 09/30/19   [provider]  carbamazepine (TEGRETOL) 200 MG tablet Take 1 tablet (200 mg total) by mouth 3 (three) times daily. Patient not taking: Reported on 10/08/2019 12/20/18    Malvin Johns, MD  cyclobenzaprine (FLEXERIL) 10 MG tablet Take 10 mg by mouth 2 (two) times daily as needed for muscle spasms.  09/25/19   [provider]  lisinopril (PRINIVIL,ZESTRIL) 10 MG tablet TAKE 1 AND 1/2 TABLETS DAILY BY MOUTH Patient not taking: Reported on 10/08/2019 03/07/18   Claiborne Billings, Renee A, DO  meloxicam (MOBIC) 15 MG tablet TAKE 1 TABLET (15 MG) BY MOUTH EVERY DAY Patient taking differently: Take 15 mg by mouth daily.  07/06/17   Kerrin Champagne, MD  PREMPRO 0.3-1.5 MG tablet Take 1 tablet by mouth daily. 09/22/19   [provider]  QUEtiapine (SEROQUEL) 50 MG tablet Take 1-2 tablets (50-100 mg total) by mouth at bedtime. 12/20/18   Malvin Johns, MD  traZODone (DESYREL) 50 MG tablet Take 50 mg by mouth at bedtime.    [provider]  tretinoin (RETIN-A) 0.1 % cream Apply 1 application topically at bedtime as needed (facial blemishes).  01/28/18   [provider]  valACYclovir (VALTREX) 1000 MG tablet Take 2 tablets (2,000 mg total) by mouth 2 (two) times daily. 07/21/18   Ofilia Neas, PA-C  venlafaxine (EFFEXOR) 37.5 MG tablet Take 37.5 mg by mouth daily.    [provider]  venlafaxine (EFFEXOR) 75 MG tablet Take 75 mg by mouth daily. Only takes when feels more anxious.    [provider]      Allergies    Aspirin, Bee venom, Ibuprofen, Ativan [lorazepam], Ketamine, Toradol [ketorolac  tromethamine], and Tramadol hcl    Review of Systems   Review of Systems  Physical Exam Updated Vital Signs BP 119/87 (BP Location: Left Arm)   Pulse 88   Temp 98.7 F (37.1 C) (Oral)   Ht 5\' 4"  (1.626 m)   Wt 71.7 kg   SpO2 99%   BMI 27.12 kg/m  Physical Exam Vitals and nursing note reviewed.  HENT:     Head: Normocephalic and atraumatic.  Eyes:     Conjunctiva/sclera: Conjunctivae normal.  Cardiovascular:     Rate and Rhythm: Normal rate.  Pulmonary:     Effort: Pulmonary effort is normal. No respiratory distress.   Musculoskeletal:        General: No swelling, tenderness, deformity or signs of injury.     Cervical back: Normal range of motion.     Comments: Right hip pain with passive range of motion, internal rotation, terminal flexion  Skin:    General: Skin is dry.     Capillary Refill: Capillary refill takes less than 2 seconds.  Neurological:     Mental Status: She is alert.     Gait: Gait abnormal (Antalgic gait favoring right leg).  Psychiatric:        Speech: Speech normal.        Behavior: Behavior normal.     ED Results / Procedures / Treatments   Labs (all labs ordered are listed, but only abnormal results are displayed) Labs Reviewed - No data to display  EKG None  Radiology DG Hip Unilat W or Wo Pelvis 2-3 Views Right  Result Date: 09/12/2022 CLINICAL DATA:  Right hip pain without known injury. EXAM: DG HIP (WITH OR WITHOUT PELVIS) 2-3V RIGHT COMPARISON:  November 11, 2015. FINDINGS: There is no evidence of hip fracture or dislocation. Severe narrowing superiorly is noted involving right hip joint. 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 No acute abnormality seen. Electronically Signed   By: Lupita Raider M.D.   On: 09/12/2022 09:47    Procedures Procedures    Medications Ordered in ED Medications  morphine (PF) 2 MG/ML injection 2 mg (2 mg Intramuscular Given 09/12/22 1005)    ED Course/ Medical Decision Making/ A&P                             Medical Decision Making Amount and/or Complexity of Data Reviewed Radiology: ordered.  Risk Prescription drug management.   Patient presents with a chief complaint of right hip pain.  Differential diagnosis includes but is not limited to fracture, dislocation, end-stage arthritis, fibromyalgia flare, others  The patient has comorbidities including fibromyalgia.  I reviewed outside medical records including urgent care notes from earlier last week when patient had shingles outbreak  I ordered plain  films of the right hip.  The patient has end-stage osteoarthritis of the right hip.  No acute fracture or dislocation noted.  I ordered the patient morphine for pain. Upon reassessment there was mild improvement in pain.   The patient was hopeful that she could have any emergent hip surgery for her hip pain.  I explained that she was currently on the quickest path to getting her hip replaced by working with orthopedics.  I explained that we were unable to arrange for an emergent hip surgery for osteoarthritis.  The patient was upset but voiced understanding.  I ordered the patient a short course of Norco as she is unable to tolerate Toradol  and states no other medicines have been working.  I explained that I would be unable to provide any refills of this medication and that I recommended she talk to her primary team or orthopedic team to see if they had further suggestions for pain control until she is able to have hip replacement.  I also let the patient know that a steroid would potentially prolong the time that it would take to have a replacement and the patient states she understood the same.  Patient to be discharged home at this time.  Patient is able to ambulate with a mildly antalgic gait.  No indication at this time for admission.        Final Clinical Impression(s) / ED Diagnoses Final diagnoses:  Right hip pain    Rx / DC Orders ED Discharge Orders          Ordered    HYDROcodone-acetaminophen (NORCO/VICODIN) 5-325 MG tablet  Every 6 hours PRN        09/12/22 1052              Pamala Duffel 09/12/22 1100    Pricilla Loveless, MD 09/14/22 [phone]

## 2022-09-12 NOTE — ED Triage Notes (Signed)
Patient here for evaluation of right hip pain. Pt states, "I need a hip replacement." Pt reports history of RA and fibromyalgia. Denies any injuries to the hip, states it just hurts.

## 2022-09-12 NOTE — Discharge Instructions (Signed)
You were seen today for right-sided hip pain.  I have prescribed a short course of Norco to help with your pain.  Please follow-up with your primary care team and orthopedic team for further management of your right hip pain.  If you develop any life-threatening symptoms please return to the emergency department.

## 2022-09-13 ENCOUNTER — Emergency Department (HOSPITAL_COMMUNITY)
Admission: EM | Admit: 2022-09-13 | Discharge: 2022-09-13 | Disposition: A | Discharge status: Home / Self Care | Payer: Medicare Other | Attending: Emergency Medicine | Admitting: Emergency Medicine

## 2022-09-13 ENCOUNTER — Other Ambulatory Visit: Payer: Self-pay

## 2022-09-13 ENCOUNTER — Encounter (HOSPITAL_COMMUNITY): Payer: Self-pay

## 2022-09-13 ENCOUNTER — Emergency Department (HOSPITAL_COMMUNITY)
Admission: EM | Admit: 2022-09-13 | Discharge: 2022-09-13 | Discharge status: Against Medical Advice | Payer: Medicare Other

## 2022-09-13 DIAGNOSIS — M25551 Pain in right hip: Secondary | ICD-10-CM | POA: Insufficient documentation

## 2022-09-13 DIAGNOSIS — R0902 Hypoxemia: Secondary | ICD-10-CM | POA: Diagnosis not present

## 2022-09-13 DIAGNOSIS — Z743 Need for continuous supervision: Secondary | ICD-10-CM | POA: Diagnosis not present

## 2022-09-13 MED ORDER — OXYCODONE-ACETAMINOPHEN 5-325 MG PO TABS: 1.0000 | ORAL_TABLET | Freq: Three times a day (TID) | ORAL | 0 refills | Status: DC | PRN

## 2022-09-13 MED ORDER — DEXAMETHASONE SODIUM PHOSPHATE 10 MG/ML IJ SOLN
10.0000 mg | Freq: Once | INTRAMUSCULAR | Status: DC
  Filled 2022-09-13: qty 1

## 2022-09-13 MED ORDER — HYDROMORPHONE HCL 1 MG/ML IJ SOLN
1.0000 mg | Freq: Once | INTRAMUSCULAR | Status: AC
  Administered 2022-09-13: 1 mg via INTRAMUSCULAR
  Filled 2022-09-13: qty 1

## 2022-09-13 NOTE — ED Notes (Signed)
Pt continues asking for pain meds. No new orders

## 2022-09-13 NOTE — ED Notes (Signed)
Pt states no relief in pain after pain meds. Dr. Jeraldine Loots is aware

## 2022-09-13 NOTE — ED Triage Notes (Signed)
BIB EMS for right hip pain for 3 months. Saw PCP today for the same complaint and was told she had no cartilage in her hip. Pt then called EMS and states she wants to see her doctor for this issue. Pt is ambulatory with EMS.

## 2022-09-13 NOTE — ED Notes (Signed)
Pt asking for pain meds again. No new orders
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
## 2022-09-13 NOTE — ED Notes (Signed)
Pt walking out to desk asking for pain meds. Dr. Jeraldine Loots is aware that pt is asking for pain meds

## 2022-09-13 NOTE — Discharge Instructions (Addendum)
Your orthopedist expects to see you in the office on Friday.  Call today to confirm the time of the appointment.  In the interval, please obtain and use medication as prescribed.  If you develop new, or concerning changes, do not hesitate to return here.

## 2022-09-13 NOTE — ED Notes (Signed)
Dr. Jeraldine Loots notified of scabbed area on pt right upper leg

## 2022-09-14 DIAGNOSIS — M25551 Pain in right hip: Secondary | ICD-10-CM | POA: Diagnosis not present

## 2022-09-14 DIAGNOSIS — Z743 Need for continuous supervision: Secondary | ICD-10-CM | POA: Diagnosis not present

## 2022-09-14 DIAGNOSIS — R58 Hemorrhage, not elsewhere classified: Secondary | ICD-10-CM | POA: Diagnosis not present

## 2022-09-15 ENCOUNTER — Emergency Department (HOSPITAL_COMMUNITY)
Admission: EM | Admit: 2022-09-15 | Discharge: 2022-09-15 | Disposition: A | Discharge status: Home / Self Care | Payer: Medicare Other | Attending: Emergency Medicine | Admitting: Emergency Medicine

## 2022-09-15 ENCOUNTER — Other Ambulatory Visit: Payer: Self-pay

## 2022-09-15 ENCOUNTER — Encounter (HOSPITAL_COMMUNITY): Payer: Self-pay

## 2022-09-15 DIAGNOSIS — R451 Restlessness and agitation: Secondary | ICD-10-CM | POA: Diagnosis not present

## 2022-09-15 DIAGNOSIS — R269 Unspecified abnormalities of gait and mobility: Secondary | ICD-10-CM | POA: Diagnosis not present

## 2022-09-15 DIAGNOSIS — M25551 Pain in right hip: Secondary | ICD-10-CM | POA: Diagnosis not present

## 2022-09-15 MED ORDER — OXYCODONE-ACETAMINOPHEN 5-325 MG PO TABS
1.0000 | ORAL_TABLET | Freq: Once | ORAL | Status: AC
  Administered 2022-09-15: 1 via ORAL
  Filled 2022-09-15: qty 1

## 2022-09-15 MED ORDER — LIDOCAINE 5 % EX PTCH
1.0000 | MEDICATED_PATCH | CUTANEOUS | Status: DC
  Administered 2022-09-15: 1 via TRANSDERMAL
  Filled 2022-09-15: qty 1

## 2022-09-15 NOTE — ED Notes (Signed)
Patient escorted out with security
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
## 2022-09-15 NOTE — ED Notes (Signed)
Patient yelling in hallway. Patient redirected back to room and informed she needs to stay in her room.
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
## 2022-09-15 NOTE — ED Provider Notes (Signed)
Kara [name] Provider Note   CSN: [id number] Arrival date & time: 09/15/22  0024     History  Chief Complaint  Patient presents with   Hip Pain    Kara [name] is a 67 y.o. female with history of rheumatoid arthritis and fibromyalgia as well as osteoarthritis who presents with chronic right hip pain.  Patient is awaiting right hip replacement with primary orthopedist Dr. Dion Saucier but presents to the emergency department tonight with concern for poor tolerance of severe pain.  Patient states she is not currently taking any medications at home as "nothing works".  States that she is allergic to aspirin therefore cannot take ibuprofen and that Tylenol does not help so she does not take it.  Was recently prescribed hydrocodone but she states that it makes her feel nauseated therefore she has not been using that either.  No topical pain relief.  Of note patient quite disruptive in the emergency department, slamming her door, yelling and pushing things around her room.  Interview with the patient conducted with security at the bedside.  Patient denies any numbness or tingling in the leg, denies weakness in the leg.  Patient ambulatory around her ED room at time of my evaluation.  Patient with laceration to left forearm, unwilling to discuss with this provider, does not wish to have it evaluated at this time.  In addition the above listed history of personally reviewed the patient's medical records previous history of PTSD, lumbar spinal stenosis, hypertension, MDD.  She is not anticoagulated.  Does have recent prescriptions for both hydrocodone 6/18 and oxycodone on 6/19  Per review of  PDMP patient filled hydrocodone prescription but did not fill oxycodone prescription from yesterday. HPI     Home Medications Prior to Admission medications   Medication Sig Start Date End Date Taking? Authorizing Provider  alendronate (FOSAMAX) 35 MG tablet Take 1 tablet  (35 mg total) by mouth every 7 (seven) days. Take with a full glass of water on an empty stomach. Patient not taking: Reported on 12/16/2018 04/04/18   Felix Pacini A, DO  ALPRAZolam Prudy Feeler) 1 MG tablet Take 1 mg by mouth daily as needed for anxiety.  10/07/19   [provider]  amphetamine-dextroamphetamine (ADDERALL XR) 30 MG 24 hr capsule Take 60 mg by mouth every morning. 09/30/19   [provider]  amphetamine-dextroamphetamine (ADDERALL) 30 MG tablet Take 30 mg by mouth daily. At 4 pm 09/30/19   [provider]  carbamazepine (TEGRETOL) 200 MG tablet Take 1 tablet (200 mg total) by mouth 3 (three) times daily. Patient not taking: Reported on 10/08/2019 12/20/18   Malvin Johns, MD  cyclobenzaprine (FLEXERIL) 10 MG tablet Take 10 mg by mouth 2 (two) times daily as needed for muscle spasms.  09/25/19   [provider]  HYDROcodone-acetaminophen (NORCO/VICODIN) 5-325 MG tablet Take 1 tablet by mouth every 6 (six) hours as needed. 09/12/22   Darrick Grinder, PA-C  lisinopril (PRINIVIL,ZESTRIL) 10 MG tablet TAKE 1 AND 1/2 TABLETS DAILY BY MOUTH Patient not taking: Reported on 10/08/2019 03/07/18   Felix Pacini A, DO  meloxicam (MOBIC) 15 MG tablet TAKE 1 TABLET (15 MG) BY MOUTH EVERY DAY Patient taking differently: Take 15 mg by mouth daily.  07/06/17   Kerrin Champagne, MD  oxyCODONE-acetaminophen (PERCOCET/ROXICET) 5-325 MG tablet Take 1 tablet by mouth every 8 (eight) hours as needed for severe pain. 09/13/22   Gerhard Munch, MD  PREMPRO 0.3-1.5 MG tablet  Take 1 tablet by mouth daily. 09/22/19   [provider]  QUEtiapine (SEROQUEL) 50 MG tablet Take 1-2 tablets (50-100 mg total) by mouth at bedtime. 12/20/18   Malvin Johns, MD  traZODone (DESYREL) 50 MG tablet Take 50 mg by mouth at bedtime.    [provider]  tretinoin (RETIN-A) 0.1 % cream Apply 1 application topically at bedtime as needed (facial blemishes).  01/28/18   [provider]   valACYclovir (VALTREX) 1000 MG tablet Take 2 tablets (2,000 mg total) by mouth 2 (two) times daily. 07/21/18   Ofilia Neas, PA-C  venlafaxine (EFFEXOR) 37.5 MG tablet Take 37.5 mg by mouth daily.    [provider]  venlafaxine (EFFEXOR) 75 MG tablet Take 75 mg by mouth daily. Only takes when feels more anxious.    [provider]      Allergies    Aspirin, Bee venom, Ibuprofen, Ativan [lorazepam], Ketamine, Toradol [ketorolac tromethamine], and Tramadol hcl    Review of Systems   Review of Systems  Musculoskeletal:        Hip pain, R.  Neurological:  Negative for tremors, weakness, light-headedness and numbness.    Physical Exam Updated Vital Signs BP (!) 149/84 (BP Location: Right Arm)   Pulse 79   Temp 97.8 F (36.6 C) (Oral)   Resp 16   Ht 5\' 4"  (1.626 m)   Wt 71.7 kg   SpO2 100%   BMI 27.12 kg/m  Physical Exam Vitals and nursing note reviewed.  Constitutional:      Appearance: She is not toxic-appearing.  HENT:     Head: Normocephalic and atraumatic.  Eyes:     General: No scleral icterus.       Right eye: No discharge.        Left eye: No discharge.     Conjunctiva/sclera: Conjunctivae normal.  Pulmonary:     Effort: Pulmonary effort is normal.  Skin:    General: Skin is warm and dry.     Capillary Refill: Capillary refill takes less than 2 seconds.  Neurological:     General: No focal deficit present.     Mental Status: She is alert.     Sensory: Sensation is intact.     Motor: Motor function is intact. No weakness.     Gait: Gait abnormal.     Comments: Antalgic gait with mild favoring of the right leg  Psychiatric:        Mood and Affect: Mood normal.        Behavior: Behavior is agitated and aggressive.     ED Results / Procedures / Treatments   Labs (all labs ordered are listed, but only abnormal results are displayed) Labs Reviewed - No data to display  EKG None  Radiology No results found.  Procedures Procedures     Medications Ordered in ED Medications  lidocaine (LIDODERM) 5 % 1 patch (1 patch Transdermal Patch Applied 09/15/22 0231)  oxyCODONE-acetaminophen (PERCOCET/ROXICET) 5-325 MG per tablet 1 tablet (1 tablet Oral Given 09/15/22 0231)    ED Course/ Medical Decision Making/ A&P                             Medical Decision Making 67 year old female presents with chronic right hip pain.  Hypertensive on intake, vital signs otherwise normal.  Cardiopulmonary exam is normal, abdominal exam is benign.  Patient neurologically intact in bilateral lower extremities.  Mildly antalgic gait favoring the  right leg but patient easily ambulatory in the room, aggressive with ED staff.  No evidence of neurologic deficit on physical exam, no skin changes to suggest infectious etiology of patient's symptoms such as herpes zoster or cellulitis.  Patient's presentation most consistent with degenerative disease secondary to her arthritis.  Patient following outpatient with Dr. Dion Saucier, orthopedics to schedule total hip replacement.  No indication for inpatient management or further ED workup this evening.    Risk Prescription drug management.   Patient exceedingly agitated at the notion of being discharged at this time.  She is demanding that we assist her in scheduling an emergent hip replacement.  It was discussed at length with patient that this is not possible and that her expedient course of action will be to proceed with scheduling with her outpatient orthopedist.  Patient has received 2 prescriptions for narcotics in the last 48 hours 1 of which she failed.  No further prescription will be provided today.  Jaslyn voiced understanding of her medical evaluation and treatment plan. Each of their questions answered to their expressed satisfaction.  Return precautions were given.  Patient is well-appearing, stable, and was discharged in good condition.  This chart was dictated using voice recognition  software, Dragon. Despite the best efforts of this provider to proofread and correct errors, errors may still occur which can change documentation meaning.   Final Clinical Impression(s) / ED Diagnoses Final diagnoses:  Right hip pain    Rx / DC Orders ED Discharge Orders     None         Sherrilee Gilles 09/15/22 0601    Maia Plan, MD 09/15/22 [phone]

## 2022-09-16 ENCOUNTER — Emergency Department (HOSPITAL_COMMUNITY)
Admission: EM | Admit: 2022-09-16 | Discharge: 2022-09-16 | Discharge status: Against Medical Advice | Payer: Medicare Other | Attending: Emergency Medicine | Admitting: Emergency Medicine

## 2022-09-16 DIAGNOSIS — Z5321 Procedure and treatment not carried out due to patient leaving prior to being seen by health care provider: Secondary | ICD-10-CM | POA: Insufficient documentation

## 2022-09-16 DIAGNOSIS — M25551 Pain in right hip: Secondary | ICD-10-CM | POA: Diagnosis not present

## 2022-09-17 ENCOUNTER — Emergency Department (HOSPITAL_COMMUNITY)
Admission: EM | Admit: 2022-09-17 | Discharge: 2022-09-17 | Discharge status: Against Medical Advice | Payer: Medicare Other | Attending: Emergency Medicine | Admitting: Emergency Medicine

## 2022-09-17 ENCOUNTER — Emergency Department (HOSPITAL_COMMUNITY): Payer: Medicare Other

## 2022-09-17 ENCOUNTER — Other Ambulatory Visit: Payer: Self-pay

## 2022-09-17 ENCOUNTER — Encounter (HOSPITAL_COMMUNITY): Payer: Self-pay

## 2022-09-17 DIAGNOSIS — Z79899 Other long term (current) drug therapy: Secondary | ICD-10-CM | POA: Diagnosis not present

## 2022-09-17 DIAGNOSIS — R296 Repeated falls: Secondary | ICD-10-CM | POA: Insufficient documentation

## 2022-09-17 DIAGNOSIS — Z043 Encounter for examination and observation following other accident: Secondary | ICD-10-CM | POA: Diagnosis not present

## 2022-09-17 DIAGNOSIS — Z5321 Procedure and treatment not carried out due to patient leaving prior to being seen by health care provider: Secondary | ICD-10-CM

## 2022-09-17 DIAGNOSIS — M25532 Pain in left wrist: Secondary | ICD-10-CM | POA: Diagnosis not present

## 2022-09-17 DIAGNOSIS — I10 Essential (primary) hypertension: Secondary | ICD-10-CM | POA: Insufficient documentation

## 2022-09-17 DIAGNOSIS — R451 Restlessness and agitation: Secondary | ICD-10-CM | POA: Insufficient documentation

## 2022-09-17 DIAGNOSIS — M1812 Unilateral primary osteoarthritis of first carpometacarpal joint, left hand: Secondary | ICD-10-CM | POA: Diagnosis not present

## 2022-09-17 DIAGNOSIS — M1611 Unilateral primary osteoarthritis, right hip: Secondary | ICD-10-CM | POA: Diagnosis not present

## 2022-09-17 DIAGNOSIS — M25551 Pain in right hip: Secondary | ICD-10-CM | POA: Insufficient documentation

## 2022-09-17 DIAGNOSIS — Z981 Arthrodesis status: Secondary | ICD-10-CM | POA: Diagnosis not present

## 2022-09-17 DIAGNOSIS — Z5329 Procedure and treatment not carried out because of patient's decision for other reasons: Secondary | ICD-10-CM | POA: Diagnosis not present

## 2022-09-17 DIAGNOSIS — S0990XA Unspecified injury of head, initial encounter: Secondary | ICD-10-CM | POA: Diagnosis not present

## 2022-09-17 MED ORDER — OXYCODONE-ACETAMINOPHEN 5-325 MG PO TABS
1.0000 | ORAL_TABLET | Freq: Once | ORAL | Status: AC
  Administered 2022-09-17: 1 via ORAL
  Filled 2022-09-17: qty 1

## 2022-09-17 NOTE — ED Notes (Signed)
Pt refusing to stay in bed, fall mats placed for patient safety.

## 2022-09-17 NOTE — ED Triage Notes (Signed)
Pt arrived from home via POV s/p fall resulting in hip pain. Pt has known fx hip and is in need of pain control. Pt awaiting hip replacement.

## 2022-09-17 NOTE — ED Provider Notes (Signed)
[place] [facility] AT [facility] Provider Note   CSN: [id number] Arrival date & time: 09/13/22  1118     History  Chief Complaint  Patient presents with   Hip Pain    Kara [name] is a 67 y.o. female.  HPI Patient presents with request for right hip replacement.  Patient notes that after dancing in [place] she felt worsening of her hip pain and came to the emergency department.  She has been seen, twice, evaluated for hip pain, case has been discussed with Ortho, and she herself is time to do the orthopedic office. She requests medication for pain, which is severe, and as above, facilitation for surgery.    Home Medications Prior to Admission medications   Medication Sig Start Date End Date Taking? Authorizing Provider  oxyCODONE-acetaminophen (PERCOCET/ROXICET) 5-325 MG tablet Take 1 tablet by mouth every 8 (eight) hours as needed for severe pain. 09/13/22  Yes Gerhard Munch, MD  alendronate (FOSAMAX) 35 MG tablet Take 1 tablet (35 mg total) by mouth every 7 (seven) days. Take with a full glass of water on an empty stomach. Patient not taking: Reported on 12/16/2018 04/04/18   Felix Pacini A, DO  ALPRAZolam Prudy Feeler) 1 MG tablet Take 1 mg by mouth daily as needed for anxiety.  10/07/19   [provider]  amphetamine-dextroamphetamine (ADDERALL XR) 30 MG 24 hr capsule Take 60 mg by mouth every morning. 09/30/19   [provider]  amphetamine-dextroamphetamine (ADDERALL) 30 MG tablet Take 30 mg by mouth daily. At 4 pm 09/30/19   [provider]  carbamazepine (TEGRETOL) 200 MG tablet Take 1 tablet (200 mg total) by mouth 3 (three) times daily. Patient not taking: Reported on 10/08/2019 12/20/18   Malvin Johns, MD  cyclobenzaprine (FLEXERIL) 10 MG tablet Take 10 mg by mouth 2 (two) times daily as needed for muscle spasms.  09/25/19   [provider]  HYDROcodone-acetaminophen (NORCO/VICODIN) 5-325 MG tablet Take 1 tablet by mouth every 6  (six) hours as needed. 09/12/22   Darrick Grinder, PA-C  lisinopril (PRINIVIL,ZESTRIL) 10 MG tablet TAKE 1 AND 1/2 TABLETS DAILY BY MOUTH Patient not taking: Reported on 10/08/2019 03/07/18   Felix Pacini A, DO  meloxicam (MOBIC) 15 MG tablet TAKE 1 TABLET (15 MG) BY MOUTH EVERY DAY Patient taking differently: Take 15 mg by mouth daily.  07/06/17   Kerrin Champagne, MD  PREMPRO 0.3-1.5 MG tablet Take 1 tablet by mouth daily. 09/22/19   [provider]  QUEtiapine (SEROQUEL) 50 MG tablet Take 1-2 tablets (50-100 mg total) by mouth at bedtime. 12/20/18   Malvin Johns, MD  traZODone (DESYREL) 50 MG tablet Take 50 mg by mouth at bedtime.    [provider]  tretinoin (RETIN-A) 0.1 % cream Apply 1 application topically at bedtime as needed (facial blemishes).  01/28/18   [provider]  valACYclovir (VALTREX) 1000 MG tablet Take 2 tablets (2,000 mg total) by mouth 2 (two) times daily. 07/21/18   Ofilia Neas, PA-C  venlafaxine (EFFEXOR) 37.5 MG tablet Take 37.5 mg by mouth daily.    [provider]  venlafaxine (EFFEXOR) 75 MG tablet Take 75 mg by mouth daily. Only takes when feels more anxious.    [provider]      Allergies    Aspirin, Bee venom, Ibuprofen, Ativan [lorazepam], Ketamine, Toradol [ketorolac tromethamine], and Tramadol hcl    Review of Systems   Review of Systems  All other systems reviewed and  are negative.   Physical Exam Updated Vital Signs BP (!) 132/91   Pulse 78   Temp 98.4 F (36.9 C) (Oral)   Resp 18   Ht 5\' 4"  (1.626 m)   Wt 71.7 kg   SpO2 97%   BMI 27.12 kg/m  Physical Exam Vitals and nursing note reviewed.  Constitutional:      General: She is not in acute distress.    Appearance: She is well-developed.  HENT:     Head: Normocephalic and atraumatic.  Eyes:     Conjunctiva/sclera: Conjunctivae normal.  Cardiovascular:     Rate and Rhythm: Normal rate and regular rhythm.  Pulmonary:     Effort: Pulmonary  effort is normal. No respiratory distress.     Breath sounds: Normal breath sounds. No stridor.  Abdominal:     General: There is no distension.  Musculoskeletal:       Legs:  Skin:    General: Skin is warm and dry.     Comments: Scab on the right posterior mid thigh.  Neurological:     Mental Status: She is alert and oriented to person, place, and time.     Cranial Nerves: No cranial nerve deficit.  Psychiatric:        Mood and Affect: Mood normal.     ED Results / Procedures / Treatments   Labs (all labs ordered are listed, but only abnormal results are displayed) Labs Reviewed - No data to display  EKG None  Radiology DG Hip Unilat W or Wo Pelvis 2-3 Views Right  Result Date: 09/17/2022 CLINICAL DATA:  Fall. EXAM: DG HIP (WITH OR WITHOUT PELVIS) 2-3V RIGHT COMPARISON:  September 12, 2022. FINDINGS: There is no evidence of hip fracture or dislocation. Severe narrowing of right hip joint is noted. 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 No acute abnormality seen. Electronically Signed   By: Lupita Raider M.D.   On: 09/17/2022 09:23   DG Wrist Complete Left  Result Date: 09/17/2022 CLINICAL DATA:  Left wrist pain after fall. EXAM: LEFT WRIST - COMPLETE 3+ VIEW COMPARISON:  None Available. FINDINGS: There is no evidence of fracture or dislocation. Severe degenerative changes seen involving the first carpometacarpal joint. Soft tissues are unremarkable. IMPRESSION: Severe osteoarthritis of the first carpometacarpal joint. No acute abnormality seen. Electronically Signed   By: Lupita Raider M.D.   On: 09/17/2022 09:21   CT Head Wo Contrast  Result Date: 09/17/2022 CLINICAL DATA:  Minor head trauma EXAM: CT HEAD WITHOUT CONTRAST CT CERVICAL SPINE WITHOUT CONTRAST TECHNIQUE: Multidetector CT imaging of the head and cervical spine was performed following the standard protocol without intravenous contrast. Multiplanar CT image reconstructions of the cervical spine  were also generated. RADIATION DOSE REDUCTION: This exam was performed according to the departmental dose-optimization program which includes automated exposure control, adjustment of the mA and/or kV according to patient size and/or use of iterative reconstruction technique. COMPARISON:  10/10/2018 FINDINGS: CT HEAD FINDINGS Brain: No evidence of acute infarction, hemorrhage, hydrocephalus, extra-axial collection or mass lesion/mass effect. Vascular: No hyperdense vessel or unexpected calcification. Skull: Normal. Negative for fracture or focal lesion. Sinuses/Orbits: No acute finding. CT CERVICAL SPINE FINDINGS Alignment: Degenerative reversal of cervical lordosis with mild anterolisthesis at C2-3 and C3-4. Skull base and vertebrae: ACDF with solid arthrodesis at C5-C7. No acute fracture. Chronic wedging of the C4 vertebral body. Soft tissues and spinal canal: No prevertebral fluid or swelling. No visible canal hematoma. Disc levels:  Advanced degenerative  disc collapse at C4-5. Upper chest: No acute finding IMPRESSION: No evidence of acute intracranial or cervical spine injury. Electronically Signed   By: Tiburcio Pea M.D.   On: 09/17/2022 09:09   CT Cervical Spine Wo Contrast  Result Date: 09/17/2022 CLINICAL DATA:  Minor head trauma EXAM: CT HEAD WITHOUT CONTRAST CT CERVICAL SPINE WITHOUT CONTRAST TECHNIQUE: Multidetector CT imaging of the head and cervical spine was performed following the standard protocol without intravenous contrast. Multiplanar CT image reconstructions of the cervical spine were also generated. RADIATION DOSE REDUCTION: This exam was performed according to the departmental dose-optimization program which includes automated exposure control, adjustment of the mA and/or kV according to patient size and/or use of iterative reconstruction technique. COMPARISON:  10/10/2018 FINDINGS: CT HEAD FINDINGS Brain: No evidence of acute infarction, hemorrhage, hydrocephalus, extra-axial  collection or mass lesion/mass effect. Vascular: No hyperdense vessel or unexpected calcification. Skull: Normal. Negative for fracture or focal lesion. Sinuses/Orbits: No acute finding. CT CERVICAL SPINE FINDINGS Alignment: Degenerative reversal of cervical lordosis with mild anterolisthesis at C2-3 and C3-4. Skull base and vertebrae: ACDF with solid arthrodesis at C5-C7. No acute fracture. Chronic wedging of the C4 vertebral body. Soft tissues and spinal canal: No prevertebral fluid or swelling. No visible canal hematoma. Disc levels:  Advanced degenerative disc collapse at C4-5. Upper chest: No acute finding IMPRESSION: No evidence of acute intracranial or cervical spine injury. Electronically Signed   By: Tiburcio Pea M.D.   On: 09/17/2022 09:09    Procedures Procedures    Medications Ordered in ED Medications  HYDROmorphone (DILAUDID) injection 1 mg (1 mg Intramuscular Given 09/13/22 1159)    ED Course/ Medical Decision Making/ A&P                             Medical Decision Making Chart review notable for evaluation within the past 24 hours for similar concerns.  Here patient requests analgesia, as well as facilitation for hip replacement.  She is distally neurovascular intact, is awake, alert, ambulatory.  She and I had a lengthy conversation about pain for planned procedure, I discussed her case with our orthopedic colleagues, and they will contact her in the coming days.   Amount and/or Complexity of Data Reviewed External Data Reviewed: notes. Discussion of management or test interpretation with external provider(s): Medics  Risk Prescription drug management.  ts were needed:1} Final Clinical Impression(s) / ED Diagnoses Final diagnoses:  Right hip pain    Rx / DC Orders ED Discharge Orders          Ordered    oxyCODONE-acetaminophen (PERCOCET/ROXICET) 5-325 MG tablet  Every 8 hours PRN        09/13/22 1346              Gerhard Munch, MD 09/17/22  1617

## 2022-09-17 NOTE — ED Notes (Signed)
Per ex husband "Pain is a trigger for her, she has mental health problems, she will be a bad patient, she also has no where to go I have been having her in an airbnb and then I was going to place her in a hotel, she has no where to go otherwise."

## 2022-09-17 NOTE — ED Provider Notes (Signed)
[place] EMERGENCY DEPARTMENT AT [facility] Provider Note   CSN: [id number] Arrival date & time: 09/17/22  0631     History  Chief Complaint  Patient presents with   Hip Pain    Kara [name] is a 67 y.o. female with past medical history significant for fibromyalgia, osteoporosis, PTSD, rheumatoid arthritis, hypertension, anemia, spondylitis, chronic pain presents to the ED complaining of left wrist pain and right hip pain after falling in the shower twice last night.  Patient is awaiting hip replacement, but is having more difficulty walking and is having increased falls.  [name] states he is concerned because he has to work and she is home alone during the day.  Patient does not have walker or other assistive devices to help her ambulate.  [name] states that Dr. Dion Saucier referred patient to [facility] due to there not being an available OR anytime soon to perform hip replacement for patient.  Her appointment is scheduled for 10/03/22.  Patient and husband requesting short term placement or other assistance until she is able to get her surgery.  Patient has been seen multiple times in the ER for same complaint, however, the falls are new.  She reports hitting her head when she fell.  Denies loss of consciousness or blood thinner use.         Home Medications Prior to Admission medications   Medication Sig Start Date End Date Taking? Authorizing Provider  alendronate (FOSAMAX) 35 MG tablet Take 1 tablet (35 mg total) by mouth every 7 (seven) days. Take with a full glass of water on an empty stomach. Patient not taking: Reported on 12/16/2018 04/04/18   Felix Pacini A, DO  ALPRAZolam Prudy Feeler) 1 MG tablet Take 1 mg by mouth daily as needed for anxiety.  10/07/19   [provider]  amphetamine-dextroamphetamine (ADDERALL XR) 30 MG 24 hr capsule Take 60 mg by mouth every morning. 09/30/19   [provider]  amphetamine-dextroamphetamine  (ADDERALL) 30 MG tablet Take 30 mg by mouth daily. At 4 pm 09/30/19   [provider]  carbamazepine (TEGRETOL) 200 MG tablet Take 1 tablet (200 mg total) by mouth 3 (three) times daily. Patient not taking: Reported on 10/08/2019 12/20/18   Malvin Johns, MD  cyclobenzaprine (FLEXERIL) 10 MG tablet Take 10 mg by mouth 2 (two) times daily as needed for muscle spasms.  09/25/19   [provider]  HYDROcodone-acetaminophen (NORCO/VICODIN) 5-325 MG tablet Take 1 tablet by mouth every 6 (six) hours as needed. 09/12/22   Darrick Grinder, PA-C  lisinopril (PRINIVIL,ZESTRIL) 10 MG tablet TAKE 1 AND 1/2 TABLETS DAILY BY MOUTH Patient not taking: Reported on 10/08/2019 03/07/18   Felix Pacini A, DO  meloxicam (MOBIC) 15 MG tablet TAKE 1 TABLET (15 MG) BY MOUTH EVERY DAY Patient taking differently: Take 15 mg by mouth daily.  07/06/17   Kerrin Champagne, MD  oxyCODONE-acetaminophen (PERCOCET/ROXICET) 5-325 MG tablet Take 1 tablet by mouth every 8 (eight) hours as needed for severe pain. 09/13/22   Gerhard Munch, MD  PREMPRO 0.3-1.5 MG tablet Take 1 tablet by mouth daily. 09/22/19   [provider]  QUEtiapine (SEROQUEL) 50 MG tablet Take 1-2 tablets (50-100 mg total) by mouth at bedtime. 12/20/18   Malvin Johns, MD  traZODone (DESYREL) 50 MG tablet Take 50 mg by mouth at bedtime.    [provider]  tretinoin (RETIN-A) 0.1 % cream Apply 1 application topically at bedtime as needed (facial blemishes).  01/28/18   [provider]  valACYclovir (VALTREX) 1000 MG tablet Take 2 tablets (2,000 mg total) by mouth 2 (two) times daily. 07/21/18   Ofilia Neas, PA-C  venlafaxine (EFFEXOR) 37.5 MG tablet Take 37.5 mg by mouth daily.    [provider]  venlafaxine (EFFEXOR) 75 MG tablet Take 75 mg by mouth daily. Only takes when feels more anxious.    [provider]      Allergies    Aspirin, Bee venom, Ibuprofen, Ativan [lorazepam], Ketamine, Toradol [ketorolac  tromethamine], and Tramadol hcl    Review of Systems   Review of Systems  Musculoskeletal:  Positive for arthralgias.  Neurological:  Negative for syncope.    Physical Exam Updated Vital Signs BP 124/79 (BP Location: Right Arm)   Pulse 81   Temp 98.4 F (36.9 C)   Resp 18   SpO2 99%  Physical Exam Vitals and nursing note reviewed.  Constitutional:      General: She is not in acute distress.    Appearance: Normal appearance. She is not ill-appearing or diaphoretic.  HENT:     Head: Normocephalic and atraumatic.  Cardiovascular:     Rate and Rhythm: Normal rate and regular rhythm.  Pulmonary:     Effort: Pulmonary effort is normal.  Musculoskeletal:     Left wrist: Tenderness present. No swelling, deformity, bony tenderness, snuff box tenderness or crepitus. Normal range of motion. Normal pulse.     Cervical back: Full passive range of motion without pain. No tenderness or bony tenderness. Normal range of motion.     Right hip: Tenderness and bony tenderness present. No deformity or crepitus. Normal range of motion. Normal strength.     Left hip: Normal range of motion.     Comments: Patient moving lower extremities appropriately.   Skin:    General: Skin is warm and dry.     Capillary Refill: Capillary refill takes less than 2 seconds.  Neurological:     Mental Status: She is alert. Mental status is at baseline.  Psychiatric:        Mood and Affect: Mood normal.        Speech: Speech normal.        Behavior: Behavior is agitated.     ED Results / Procedures / Treatments   Labs (all labs ordered are listed, but only abnormal results are displayed) Labs Reviewed - No data to display  EKG None  Radiology DG Hip Unilat W or Wo Pelvis 2-3 Views Right  Result Date: 09/17/2022 CLINICAL DATA:  Fall. EXAM: DG HIP (WITH OR WITHOUT PELVIS) 2-3V RIGHT COMPARISON:  September 12, 2022. FINDINGS: There is no evidence of hip fracture or dislocation. Severe narrowing of right hip  joint is noted. 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 No acute abnormality seen. Electronically Signed   By: Lupita Raider M.D.   On: 09/17/2022 09:23   DG Wrist Complete Left  Result Date: 09/17/2022 CLINICAL DATA:  Left wrist pain after fall. EXAM: LEFT WRIST - COMPLETE 3+ VIEW COMPARISON:  None Available. FINDINGS: There is no evidence of fracture or dislocation. Severe degenerative changes seen involving the first carpometacarpal joint. Soft tissues are unremarkable. IMPRESSION: Severe osteoarthritis of the first carpometacarpal joint. No acute abnormality seen. Electronically Signed   By: Lupita Raider M.D.   On: 09/17/2022 09:21   CT Head Wo Contrast  Result Date: 09/17/2022 CLINICAL DATA:  Minor head trauma EXAM: CT HEAD WITHOUT CONTRAST CT  CERVICAL SPINE WITHOUT CONTRAST TECHNIQUE: Multidetector CT imaging of the head and cervical spine was performed following the standard protocol without intravenous contrast. Multiplanar CT image reconstructions of the cervical spine were also generated. RADIATION DOSE REDUCTION: This exam was performed according to the departmental dose-optimization program which includes automated exposure control, adjustment of the mA and/or kV according to patient size and/or use of iterative reconstruction technique. COMPARISON:  10/10/2018 FINDINGS: CT HEAD FINDINGS Brain: No evidence of acute infarction, hemorrhage, hydrocephalus, extra-axial collection or mass lesion/mass effect. Vascular: No hyperdense vessel or unexpected calcification. Skull: Normal. Negative for fracture or focal lesion. Sinuses/Orbits: No acute finding. CT CERVICAL SPINE FINDINGS Alignment: Degenerative reversal of cervical lordosis with mild anterolisthesis at C2-3 and C3-4. Skull base and vertebrae: ACDF with solid arthrodesis at C5-C7. No acute fracture. Chronic wedging of the C4 vertebral body. Soft tissues and spinal canal: No prevertebral fluid or swelling. No visible  canal hematoma. Disc levels:  Advanced degenerative disc collapse at C4-5. Upper chest: No acute finding IMPRESSION: No evidence of acute intracranial or cervical spine injury. Electronically Signed   By: Tiburcio Pea M.D.   On: 09/17/2022 09:09   CT Cervical Spine Wo Contrast  Result Date: 09/17/2022 CLINICAL DATA:  Minor head trauma EXAM: CT HEAD WITHOUT CONTRAST CT CERVICAL SPINE WITHOUT CONTRAST TECHNIQUE: Multidetector CT imaging of the head and cervical spine was performed following the standard protocol without intravenous contrast. Multiplanar CT image reconstructions of the cervical spine were also generated. RADIATION DOSE REDUCTION: This exam was performed according to the departmental dose-optimization program which includes automated exposure control, adjustment of the mA and/or kV according to patient size and/or use of iterative reconstruction technique. COMPARISON:  10/10/2018 FINDINGS: CT HEAD FINDINGS Brain: No evidence of acute infarction, hemorrhage, hydrocephalus, extra-axial collection or mass lesion/mass effect. Vascular: No hyperdense vessel or unexpected calcification. Skull: Normal. Negative for fracture or focal lesion. Sinuses/Orbits: No acute finding. CT CERVICAL SPINE FINDINGS Alignment: Degenerative reversal of cervical lordosis with mild anterolisthesis at C2-3 and C3-4. Skull base and vertebrae: ACDF with solid arthrodesis at C5-C7. No acute fracture. Chronic wedging of the C4 vertebral body. Soft tissues and spinal canal: No prevertebral fluid or swelling. No visible canal hematoma. Disc levels:  Advanced degenerative disc collapse at C4-5. Upper chest: No acute finding IMPRESSION: No evidence of acute intracranial or cervical spine injury. Electronically Signed   By: Tiburcio Pea M.D.   On: 09/17/2022 09:09    Procedures Procedures    Medications Ordered in ED Medications  oxyCODONE-acetaminophen (PERCOCET/ROXICET) 5-325 MG per tablet 1 tablet (1 tablet Oral Given  09/17/22 1610)    ED Course/ Medical Decision Making/ A&P                             Medical Decision Making Amount and/or Complexity of Data Reviewed Radiology: ordered.  Risk Prescription drug management.   This patient presents to the ED with chief complaint(s) of right hip pain, left wrist pain, increasing falls with pertinent past medical history of fibromyalgia, chronic pain, RA, DDD, osteoporosis.  The complaint involves an extensive differential diagnosis and also carries with it a high risk of complications and morbidity.    The differential diagnosis includes acute fracture or dislocation, acute on chronic pain due to injury, musculoskeletal strain, worsening osteoarthritis/osteoarthritis flare, RA flare   The initial plan is to obtain imaging of right hip, left wrist, CT head and c-spine due to fall  Additional  history obtained: Additional history obtained from spouse, patient's ex-husband is at bedside with patient who states he is not able to stay with the patient and is concerned that is she alone and falling.  He is unsure of what to do and is inquiring about home health, assistive devices, or short term placement in facility until patient is able to have hip replacement/rehab done Records reviewed  patient has been seen multiple times in the ED for similar complaint over the last several days.  PDMP reveals patient has been prescribed narcotic pain medicine for her hip pain in the recent days.  Initial Assessment:   On exam, patient is rolling around the bed and appears to be uncomfortable and agitated.  No obvious gross deformities on exam.  Head is normocephalic atraumatic.  She has normal passive ROM of neck without pain.  No appreciable step-offs to cervical spine.  Right hip generally tender to palpation, no swelling, ecchymosis, hematoma, deformity.  Left wrist tender at the base of the thenar eminence.  She has normal sensation and movement of all digits.  Gait not  initially assessed due to patient being in pain.  Independent visualization and interpretation of imaging: I independently visualized the following imaging with scope of interpretation limited to determining acute life threatening conditions related to emergency care: Hip x-ray and wrist x-ray, which revealed severe osteoarthritis but no acute bony injury or dislocation.  CT head and cervical spine without acute abnormality.  Treatment and Reassessment: Patient given single dose of oxycodone for pain.  Upon patient's return from imaging, patient demanding more pain medicine per RN.  Informed by RN that patient was threatening to throw herself on the floor if she did not receive more pain medication.  Per review of RN notes, patient's ex-husband who is at bedside states that he has been pink for air B&B and hotels for the patient as she has "nowhere else to go".  Will place consult for TOC to determine if patient meets home health/DME or facility placement.  Patient observed eloping from the ED, walking with a steady gait, yelling for her husband [name].  Disposition:   Patient eloped from the ED prior to [facility] being able to come and speak with her.  Unfortunately, I was unable to speak with patient about imaging results prior to her eloping.  Patient was made aware prior to elopement that TOC would be consulted for assistance.            Final Clinical Impression(s) / ED Diagnoses Final diagnoses:  Right hip pain  Eloped from emergency department  Frequent falls    Rx / DC Orders ED Discharge Orders     None         Lenard Simmer, PA-C 09/17/22 1031    Wynetta Fines, MD 09/17/22 1605

## 2022-09-17 NOTE — ED Notes (Signed)
Pt threatening to throw herself on the floor if she does not get anymore pain medications.

## 2022-09-18 ENCOUNTER — Other Ambulatory Visit: Payer: Self-pay

## 2022-09-18 ENCOUNTER — Emergency Department (HOSPITAL_COMMUNITY)
Admission: EM | Admit: 2022-09-18 | Discharge: 2022-09-18 | Discharge status: Against Medical Advice | Payer: Medicare Other | Attending: Emergency Medicine | Admitting: Emergency Medicine

## 2022-09-18 DIAGNOSIS — Z5321 Procedure and treatment not carried out due to patient leaving prior to being seen by health care provider: Secondary | ICD-10-CM | POA: Diagnosis not present

## 2022-09-18 DIAGNOSIS — M25551 Pain in right hip: Secondary | ICD-10-CM | POA: Diagnosis not present

## 2022-09-18 NOTE — ED Notes (Signed)
Pt moved from room chair to wheelchair and was taken to lobby to wait for a room in the back.  Pt asked this RN to wheel her to the coke machine which I did

## 2022-09-18 NOTE — ED Notes (Signed)
Pt found in the lobby screaming on the phone with her ex husband demanding he pick her up from the ED entrance. Security and Press photographer notified. Pt becoming increasingly verbally abusive towards staff and security. Pt walking around lobby and screaming out in pain at this time.

## 2022-09-18 NOTE — ED Provider Triage Note (Signed)
Emergency Medicine Provider Triage Evaluation Note  Kara [name] , a 67 y.o. female  was evaluated in triage.  Pt complains of right hip pain.  Patient's been seen here every day for the past few days.  She has been using her fentanyl patch.  Denies any new falls.  Review of Systems  Positive:  Negative:  Physical Exam  BP (!) 164/95 (BP Location: Right Arm)   Pulse (!) 109   Temp 98.6 F (37 C) (Oral)   Resp 19   SpO2 98%  Gen:   Patient is screaming and belligerent in triage, cursing at myself and staff members Resp:  Normal effort  MSK:   Moves extremities without difficulty  Other:  Is able to flex and extend her knee without pain.  Palpable pulses.  Compartments are soft.  Tenderness to the lateral right hip  Medical Decision Making  Medically screening exam initiated at 6:39 PM.  Appropriate orders placed.  Kara [name] was informed that the remainder of the evaluation will be completed by another provider, this initial triage assessment does not replace that evaluation, and the importance of remaining in the ED until their evaluation is complete.  Patient walked from the wheelchair to the chair in the triage room.  She was just seen here yesterday for similar complaint and eloped.  I do not see need to reorder another x-ray given that she has not had any additional falls.   Achille Rich, [place] 09/18/22 1840

## 2022-09-18 NOTE — ED Triage Notes (Addendum)
Pt arrived with complaint of hip pain.  Seen for same the past several days for pain control.  Pt is yelling and throwing herself around the room stating she can't walk d/t pain.  Slamming doors.  Pt is up walking in the room.

## 2022-09-24 DIAGNOSIS — S79911A Unspecified injury of right hip, initial encounter: Secondary | ICD-10-CM | POA: Diagnosis not present

## 2022-10-12 ENCOUNTER — Emergency Department (HOSPITAL_COMMUNITY)
Admission: EM | Admit: 2022-10-12 | Discharge: 2022-10-13 | Discharge status: Against Medical Advice | Payer: Medicare Other | Attending: Emergency Medicine | Admitting: Emergency Medicine

## 2022-10-12 ENCOUNTER — Encounter (HOSPITAL_COMMUNITY): Payer: Self-pay | Admitting: Emergency Medicine

## 2022-10-12 DIAGNOSIS — Z743 Need for continuous supervision: Secondary | ICD-10-CM | POA: Diagnosis not present

## 2022-10-12 DIAGNOSIS — Z5321 Procedure and treatment not carried out due to patient leaving prior to being seen by health care provider: Secondary | ICD-10-CM | POA: Insufficient documentation

## 2022-10-12 DIAGNOSIS — M25572 Pain in left ankle and joints of left foot: Secondary | ICD-10-CM | POA: Diagnosis not present

## 2022-10-12 DIAGNOSIS — M25559 Pain in unspecified hip: Secondary | ICD-10-CM | POA: Diagnosis not present

## 2022-10-12 DIAGNOSIS — M25551 Pain in right hip: Secondary | ICD-10-CM | POA: Diagnosis not present

## 2022-10-12 DIAGNOSIS — R6889 Other general symptoms and signs: Secondary | ICD-10-CM | POA: Diagnosis not present

## 2022-10-12 NOTE — ED Triage Notes (Addendum)
Hx of hip pain. Mental health issues. Seen multiple visits for this. PT walked to the stretcher and wheelchair for EMS.

## 2022-10-13 NOTE — ED Notes (Addendum)
No answer and not visible in waiting room.

## 2022-10-31 DIAGNOSIS — M069 Rheumatoid arthritis, unspecified: Secondary | ICD-10-CM | POA: Diagnosis not present

## 2022-10-31 DIAGNOSIS — M5416 Radiculopathy, lumbar region: Secondary | ICD-10-CM | POA: Diagnosis not present

## 2022-11-21 ENCOUNTER — Encounter (HOSPITAL_COMMUNITY): Payer: Self-pay

## 2022-11-21 ENCOUNTER — Other Ambulatory Visit: Payer: Self-pay

## 2022-11-21 ENCOUNTER — Inpatient Hospital Stay (HOSPITAL_COMMUNITY)
Admission: EM | Admit: 2022-11-21 | Discharge: 2022-11-23 | DRG: 917 | Discharge status: Against Medical Advice | Payer: Medicare Other | Attending: Family Medicine | Admitting: Family Medicine

## 2022-11-21 ENCOUNTER — Inpatient Hospital Stay (HOSPITAL_COMMUNITY): Payer: Medicare Other

## 2022-11-21 ENCOUNTER — Emergency Department (HOSPITAL_COMMUNITY): Payer: Medicare Other

## 2022-11-21 DIAGNOSIS — R10819 Abdominal tenderness, unspecified site: Secondary | ICD-10-CM | POA: Diagnosis present

## 2022-11-21 DIAGNOSIS — R404 Transient alteration of awareness: Secondary | ICD-10-CM | POA: Diagnosis not present

## 2022-11-21 DIAGNOSIS — F13239 Sedative, hypnotic or anxiolytic dependence with withdrawal, unspecified: Secondary | ICD-10-CM | POA: Diagnosis not present

## 2022-11-21 DIAGNOSIS — M810 Age-related osteoporosis without current pathological fracture: Secondary | ICD-10-CM | POA: Diagnosis not present

## 2022-11-21 DIAGNOSIS — Z91030 Bee allergy status: Secondary | ICD-10-CM

## 2022-11-21 DIAGNOSIS — Z888 Allergy status to other drugs, medicaments and biological substances status: Secondary | ICD-10-CM | POA: Diagnosis not present

## 2022-11-21 DIAGNOSIS — F419 Anxiety disorder, unspecified: Secondary | ICD-10-CM | POA: Diagnosis not present

## 2022-11-21 DIAGNOSIS — S0990XA Unspecified injury of head, initial encounter: Secondary | ICD-10-CM | POA: Diagnosis not present

## 2022-11-21 DIAGNOSIS — E860 Dehydration: Secondary | ICD-10-CM | POA: Diagnosis present

## 2022-11-21 DIAGNOSIS — R339 Retention of urine, unspecified: Secondary | ICD-10-CM | POA: Diagnosis present

## 2022-11-21 DIAGNOSIS — Y900 Blood alcohol level of less than 20 mg/100 ml: Secondary | ICD-10-CM | POA: Diagnosis present

## 2022-11-21 DIAGNOSIS — F988 Other specified behavioral and emotional disorders with onset usually occurring in childhood and adolescence: Secondary | ICD-10-CM | POA: Diagnosis present

## 2022-11-21 DIAGNOSIS — R319 Hematuria, unspecified: Secondary | ICD-10-CM | POA: Diagnosis present

## 2022-11-21 DIAGNOSIS — G9340 Encephalopathy, unspecified: Secondary | ICD-10-CM | POA: Diagnosis not present

## 2022-11-21 DIAGNOSIS — Z91410 Personal history of adult physical and sexual abuse: Secondary | ICD-10-CM

## 2022-11-21 DIAGNOSIS — F1123 Opioid dependence with withdrawal: Secondary | ICD-10-CM | POA: Diagnosis not present

## 2022-11-21 DIAGNOSIS — F909 Attention-deficit hyperactivity disorder, unspecified type: Secondary | ICD-10-CM | POA: Diagnosis present

## 2022-11-21 DIAGNOSIS — T40411A Poisoning by fentanyl or fentanyl analogs, accidental (unintentional), initial encounter: Secondary | ICD-10-CM | POA: Diagnosis present

## 2022-11-21 DIAGNOSIS — F329 Major depressive disorder, single episode, unspecified: Secondary | ICD-10-CM | POA: Insufficient documentation

## 2022-11-21 DIAGNOSIS — M25551 Pain in right hip: Secondary | ICD-10-CM | POA: Diagnosis not present

## 2022-11-21 DIAGNOSIS — M6282 Rhabdomyolysis: Secondary | ICD-10-CM | POA: Diagnosis not present

## 2022-11-21 DIAGNOSIS — Z8249 Family history of ischemic heart disease and other diseases of the circulatory system: Secondary | ICD-10-CM

## 2022-11-21 DIAGNOSIS — Z79899 Other long term (current) drug therapy: Secondary | ICD-10-CM | POA: Diagnosis not present

## 2022-11-21 DIAGNOSIS — R0902 Hypoxemia: Secondary | ICD-10-CM | POA: Diagnosis not present

## 2022-11-21 DIAGNOSIS — R918 Other nonspecific abnormal finding of lung field: Secondary | ICD-10-CM | POA: Diagnosis not present

## 2022-11-21 DIAGNOSIS — F1220 Cannabis dependence, uncomplicated: Secondary | ICD-10-CM | POA: Diagnosis present

## 2022-11-21 DIAGNOSIS — T402X2A Poisoning by other opioids, intentional self-harm, initial encounter: Secondary | ICD-10-CM | POA: Diagnosis present

## 2022-11-21 DIAGNOSIS — K7689 Other specified diseases of liver: Secondary | ICD-10-CM | POA: Diagnosis not present

## 2022-11-21 DIAGNOSIS — E872 Acidosis, unspecified: Secondary | ICD-10-CM | POA: Diagnosis present

## 2022-11-21 DIAGNOSIS — F1193 Opioid use, unspecified with withdrawal: Secondary | ICD-10-CM | POA: Insufficient documentation

## 2022-11-21 DIAGNOSIS — R935 Abnormal findings on diagnostic imaging of other abdominal regions, including retroperitoneum: Secondary | ICD-10-CM | POA: Diagnosis not present

## 2022-11-21 DIAGNOSIS — T40412A Poisoning by fentanyl or fentanyl analogs, intentional self-harm, initial encounter: Principal | ICD-10-CM | POA: Diagnosis present

## 2022-11-21 DIAGNOSIS — R1310 Dysphagia, unspecified: Secondary | ICD-10-CM | POA: Diagnosis not present

## 2022-11-21 DIAGNOSIS — G629 Polyneuropathy, unspecified: Secondary | ICD-10-CM | POA: Diagnosis present

## 2022-11-21 DIAGNOSIS — Z811 Family history of alcohol abuse and dependence: Secondary | ICD-10-CM

## 2022-11-21 DIAGNOSIS — I10 Essential (primary) hypertension: Secondary | ICD-10-CM | POA: Diagnosis present

## 2022-11-21 DIAGNOSIS — G894 Chronic pain syndrome: Secondary | ICD-10-CM | POA: Diagnosis present

## 2022-11-21 DIAGNOSIS — Z5329 Procedure and treatment not carried out because of patient's decision for other reasons: Secondary | ICD-10-CM | POA: Diagnosis present

## 2022-11-21 DIAGNOSIS — M1611 Unilateral primary osteoarthritis, right hip: Secondary | ICD-10-CM | POA: Diagnosis present

## 2022-11-21 DIAGNOSIS — R200 Anesthesia of skin: Secondary | ICD-10-CM | POA: Diagnosis present

## 2022-11-21 DIAGNOSIS — G928 Other toxic encephalopathy: Secondary | ICD-10-CM | POA: Diagnosis present

## 2022-11-21 DIAGNOSIS — Z886 Allergy status to analgesic agent status: Secondary | ICD-10-CM | POA: Diagnosis not present

## 2022-11-21 DIAGNOSIS — Z9152 Personal history of nonsuicidal self-harm: Secondary | ICD-10-CM

## 2022-11-21 DIAGNOSIS — M21372 Foot drop, left foot: Secondary | ICD-10-CM | POA: Diagnosis present

## 2022-11-21 DIAGNOSIS — Y92009 Unspecified place in unspecified non-institutional (private) residence as the place of occurrence of the external cause: Secondary | ICD-10-CM | POA: Diagnosis not present

## 2022-11-21 DIAGNOSIS — T40411D Poisoning by fentanyl or fentanyl analogs, accidental (unintentional), subsequent encounter: Secondary | ICD-10-CM | POA: Diagnosis not present

## 2022-11-21 DIAGNOSIS — Z743 Need for continuous supervision: Secondary | ICD-10-CM | POA: Diagnosis not present

## 2022-11-21 DIAGNOSIS — Z981 Arthrodesis status: Secondary | ICD-10-CM | POA: Diagnosis not present

## 2022-11-21 DIAGNOSIS — S199XXA Unspecified injury of neck, initial encounter: Secondary | ICD-10-CM | POA: Diagnosis not present

## 2022-11-21 DIAGNOSIS — R4182 Altered mental status, unspecified: Secondary | ICD-10-CM | POA: Diagnosis present

## 2022-11-21 DIAGNOSIS — F32A Depression, unspecified: Secondary | ICD-10-CM | POA: Diagnosis not present

## 2022-11-21 DIAGNOSIS — F4310 Post-traumatic stress disorder, unspecified: Secondary | ICD-10-CM | POA: Diagnosis present

## 2022-11-21 DIAGNOSIS — Z825 Family history of asthma and other chronic lower respiratory diseases: Secondary | ICD-10-CM

## 2022-11-21 DIAGNOSIS — F1010 Alcohol abuse, uncomplicated: Secondary | ICD-10-CM | POA: Diagnosis present

## 2022-11-21 DIAGNOSIS — Z96611 Presence of right artificial shoulder joint: Secondary | ICD-10-CM | POA: Diagnosis present

## 2022-11-21 DIAGNOSIS — R451 Restlessness and agitation: Secondary | ICD-10-CM | POA: Diagnosis not present

## 2022-11-21 DIAGNOSIS — M797 Fibromyalgia: Secondary | ICD-10-CM | POA: Diagnosis not present

## 2022-11-21 DIAGNOSIS — Z8673 Personal history of transient ischemic attack (TIA), and cerebral infarction without residual deficits: Secondary | ICD-10-CM

## 2022-11-21 DIAGNOSIS — R6889 Other general symptoms and signs: Secondary | ICD-10-CM | POA: Diagnosis not present

## 2022-11-21 DIAGNOSIS — Z8261 Family history of arthritis: Secondary | ICD-10-CM

## 2022-11-21 DIAGNOSIS — F411 Generalized anxiety disorder: Secondary | ICD-10-CM | POA: Diagnosis present

## 2022-11-21 DIAGNOSIS — G8929 Other chronic pain: Secondary | ICD-10-CM | POA: Diagnosis present

## 2022-11-21 DIAGNOSIS — R29898 Other symptoms and signs involving the musculoskeletal system: Secondary | ICD-10-CM | POA: Diagnosis not present

## 2022-11-21 DIAGNOSIS — R748 Abnormal levels of other serum enzymes: Secondary | ICD-10-CM | POA: Diagnosis present

## 2022-11-21 DIAGNOSIS — F603 Borderline personality disorder: Secondary | ICD-10-CM | POA: Diagnosis present

## 2022-11-21 LAB — URINALYSIS, ROUTINE W REFLEX MICROSCOPIC
Bilirubin Urine: NEGATIVE
Glucose, UA: NEGATIVE mg/dL
Ketones, ur: NEGATIVE mg/dL
Nitrite: NEGATIVE
Protein, ur: 30 mg/dL — AB
Specific Gravity, Urine: 1.009 (ref 1.005–1.030)
pH: 6 (ref 5.0–8.0)

## 2022-11-21 LAB — CBC WITH DIFFERENTIAL/PLATELET
Abs Immature Granulocytes: 0.1 10*3/uL — ABNORMAL HIGH (ref 0.00–0.07)
Basophils Absolute: 0 10*3/uL (ref 0.0–0.1)
Basophils Relative: 0 %
Eosinophils Absolute: 0 10*3/uL (ref 0.0–0.5)
Eosinophils Relative: 0 %
HCT: 44.3 % (ref 36.0–46.0)
Hemoglobin: 13.3 g/dL (ref 12.0–15.0)
Immature Granulocytes: 1 %
Lymphocytes Relative: 4 %
Lymphs Abs: 0.7 10*3/uL (ref 0.7–4.0)
MCH: 24.8 pg — ABNORMAL LOW (ref 26.0–34.0)
MCHC: 30 g/dL (ref 30.0–36.0)
MCV: 82.5 fL (ref 80.0–100.0)
Monocytes Absolute: 0.7 10*3/uL (ref 0.1–1.0)
Monocytes Relative: 4 %
Neutro Abs: 14.8 10*3/uL — ABNORMAL HIGH (ref 1.7–7.7)
Neutrophils Relative %: 91 %
Platelets: 417 10*3/uL — ABNORMAL HIGH (ref 150–400)
RBC: 5.37 MIL/uL — ABNORMAL HIGH (ref 3.87–5.11)
RDW: 15.3 % (ref 11.5–15.5)
WBC: 16.3 10*3/uL — ABNORMAL HIGH (ref 4.0–10.5)
nRBC: 0 % (ref 0.0–0.2)

## 2022-11-21 LAB — HEPATIC FUNCTION PANEL
ALT: 29 U/L (ref 0–44)
AST: 64 U/L — ABNORMAL HIGH (ref 15–41)
Albumin: 3.9 g/dL (ref 3.5–5.0)
Alkaline Phosphatase: 102 U/L (ref 38–126)
Bilirubin, Direct: <0.1 mg/dL (ref 0.0–0.2)
Total Bilirubin: 0.5 mg/dL (ref 0.3–1.2)
Total Protein: 7.5 g/dL (ref 6.5–8.1)

## 2022-11-21 LAB — BASIC METABOLIC PANEL
Anion gap: 16 — ABNORMAL HIGH (ref 5–15)
BUN: 16 mg/dL (ref 8–23)
CO2: 23 mmol/L (ref 22–32)
Calcium: 9.5 mg/dL (ref 8.9–10.3)
Chloride: 97 mmol/L — ABNORMAL LOW (ref 98–111)
Creatinine, Ser: 0.63 mg/dL (ref 0.44–1.00)
GFR, Estimated: >60 mL/min (ref >60)
Glucose, Bld: 121 mg/dL — ABNORMAL HIGH (ref 70–99)
Potassium: 3.9 mmol/L (ref 3.5–5.1)
Sodium: 136 mmol/L (ref 135–145)

## 2022-11-21 LAB — I-STAT CHEM 8, ED
BUN: 20 mg/dL (ref 8–23)
Calcium, Ion: 1.14 mmol/L — ABNORMAL LOW (ref 1.15–1.40)
Chloride: 99 mmol/L (ref 98–111)
Creatinine, Ser: 0.5 mg/dL (ref 0.44–1.00)
Glucose, Bld: 118 mg/dL — ABNORMAL HIGH (ref 70–99)
HCT: 43 % (ref 36.0–46.0)
Hemoglobin: 14.6 g/dL (ref 12.0–15.0)
Potassium: 4.3 mmol/L (ref 3.5–5.1)
Sodium: 136 mmol/L (ref 135–145)
TCO2: 28 mmol/L (ref 22–32)

## 2022-11-21 LAB — I-STAT VENOUS BLOOD GAS, ED
Acid-Base Excess: 3 mmol/L — ABNORMAL HIGH (ref 0.0–2.0)
Bicarbonate: 29.1 mmol/L — ABNORMAL HIGH (ref 20.0–28.0)
Calcium, Ion: 1.13 mmol/L — ABNORMAL LOW (ref 1.15–1.40)
HCT: 42 % (ref 36.0–46.0)
Hemoglobin: 14.3 g/dL (ref 12.0–15.0)
O2 Saturation: 92 %
Potassium: 4.2 mmol/L (ref 3.5–5.1)
Sodium: 136 mmol/L (ref 135–145)
TCO2: 31 mmol/L (ref 22–32)
pCO2, Ven: 49.2 mmHg (ref 44–60)
pH, Ven: 7.379 (ref 7.25–7.43)
pO2, Ven: 65 mmHg — ABNORMAL HIGH (ref 32–45)

## 2022-11-21 LAB — TROPONIN I (HIGH SENSITIVITY)
Troponin I (High Sensitivity): 8 ng/L (ref <18)
Troponin I (High Sensitivity): 8 ng/L (ref <18)

## 2022-11-21 LAB — RAPID URINE DRUG SCREEN, HOSP PERFORMED
Amphetamines: POSITIVE — AB
Barbiturates: NOT DETECTED
Benzodiazepines: POSITIVE — AB
Cocaine: NOT DETECTED
Opiates: POSITIVE — AB
Tetrahydrocannabinol: POSITIVE — AB

## 2022-11-21 LAB — AMMONIA: Ammonia: 12 umol/L (ref 9–35)

## 2022-11-21 LAB — CBG MONITORING, ED: Glucose-Capillary: 114 mg/dL — ABNORMAL HIGH (ref 70–99)

## 2022-11-21 LAB — TSH: TSH: 0.262 u[IU]/mL — ABNORMAL LOW (ref 0.350–4.500)

## 2022-11-21 LAB — LACTIC ACID, PLASMA: Lactic Acid, Venous: 3.1 mmol/L (ref 0.5–1.9)

## 2022-11-21 LAB — I-STAT CG4 LACTIC ACID, ED: Lactic Acid, Venous: 4 mmol/L (ref 0.5–1.9)

## 2022-11-21 LAB — MAGNESIUM: Magnesium: 2.2 mg/dL (ref 1.7–2.4)

## 2022-11-21 LAB — CK: Total CK: 8914 U/L — ABNORMAL HIGH (ref 38–234)

## 2022-11-21 MED ORDER — LACTATED RINGERS IV SOLN: INTRAVENOUS | Status: DC

## 2022-11-21 MED ORDER — VANCOMYCIN HCL IN DEXTROSE 1-5 GM/200ML-% IV SOLN: 1000.0000 mg | Freq: Once | INTRAVENOUS | Status: DC

## 2022-11-21 MED ORDER — NALOXONE HCL 4 MG/10ML IJ SOLN
0.2500 mg/h | INTRAVENOUS | Status: DC
  Filled 2022-11-21: qty 10

## 2022-11-21 MED ORDER — LIDOCAINE 5 % EX PTCH
1.0000 | MEDICATED_PATCH | CUTANEOUS | Status: DC
  Administered 2022-11-21: 1 via TRANSDERMAL
  Filled 2022-11-21 (×3): qty 1

## 2022-11-21 MED ORDER — LACTATED RINGERS IV BOLUS
1000.0000 mL | Freq: Once | INTRAVENOUS | Status: AC
  Administered 2022-11-21: 1000 mL via INTRAVENOUS

## 2022-11-21 MED ORDER — SODIUM CHLORIDE 0.9 % IV SOLN
2.0000 g | Freq: Once | INTRAVENOUS | Status: AC
  Administered 2022-11-21: 2 g via INTRAVENOUS
  Filled 2022-11-21: qty 12.5

## 2022-11-21 MED ORDER — VANCOMYCIN HCL 1500 MG/300ML IV SOLN
1500.0000 mg | Freq: Once | INTRAVENOUS | Status: AC
  Administered 2022-11-21: 1500 mg via INTRAVENOUS
  Filled 2022-11-21: qty 300

## 2022-11-21 MED ORDER — ACETAMINOPHEN 325 MG PO TABS
650.0000 mg | ORAL_TABLET | Freq: Four times a day (QID) | ORAL | Status: DC
  Administered 2022-11-21 – 2022-11-23 (×6): 650 mg via ORAL
  Filled 2022-11-21 (×6): qty 2

## 2022-11-21 MED ORDER — ENOXAPARIN SODIUM 40 MG/0.4ML IJ SOSY
40.0000 mg | PREFILLED_SYRINGE | INTRAMUSCULAR | Status: DC
  Administered 2022-11-21 – 2022-11-22 (×2): 40 mg via SUBCUTANEOUS
  Filled 2022-11-21 (×2): qty 0.4

## 2022-11-21 MED ORDER — QUETIAPINE FUMARATE 25 MG PO TABS
25.0000 mg | ORAL_TABLET | Freq: Every day | ORAL | Status: DC
  Administered 2022-11-22 – 2022-11-23 (×2): 25 mg via ORAL
  Filled 2022-11-21 (×2): qty 1

## 2022-11-21 MED ORDER — VENLAFAXINE HCL ER 75 MG PO CP24
75.0000 mg | ORAL_CAPSULE | Freq: Every day | ORAL | Status: DC
  Administered 2022-11-22 – 2022-11-23 (×2): 75 mg via ORAL
  Filled 2022-11-21 (×2): qty 1

## 2022-11-21 MED ORDER — LISINOPRIL 10 MG PO TABS
15.0000 mg | ORAL_TABLET | Freq: Every day | ORAL | Status: DC
  Administered 2022-11-22 – 2022-11-23 (×3): 15 mg via ORAL
  Filled 2022-11-21 (×3): qty 2

## 2022-11-21 MED ORDER — NALOXONE HCL 0.4 MG/ML IJ SOLN
0.4000 mg | Freq: Once | INTRAMUSCULAR | Status: AC
  Administered 2022-11-21: 0.4 mg via INTRAVENOUS
  Filled 2022-11-21: qty 1

## 2022-11-21 NOTE — Assessment & Plan Note (Signed)
Most likely due to immobility for prolonged time. Could also be due to amphetamines. No renal toxicity at this time. S/p 3L bolus  - Continue 1.5 mL/hr LR  - AM BMP, CK

## 2022-11-21 NOTE — ED Triage Notes (Signed)
Pt bib ems from home, pt was supposed to have pre op testing for hip surgery but never showed up, pt was found in floor by family, pt arousable by voice. EMS found 3 fentanyl patches and a "sweet dreams" patch on her. Pupils 2 and equal. VSS

## 2022-11-21 NOTE — Progress Notes (Signed)
FMTS Brief Progress Note  S:Seen at bedside with Dr. Sharion Dove. She was somnolent on arrival but awakened to tactile stimuli and became acutely tearful and agitated. She tells me she has no recollection of the events leading to her admission. She tells me she feels like this is a "nightmare" that she can't get out from.    O: BP (!) 184/121   Pulse (!) 108   Temp 98 F (36.7 C)   Resp 18   Ht 5\' 4"  (1.626 m) Comment: per pt  Wt 73.3 kg   SpO2 100%   BMI 27.74 kg/m   Gen: Tearful, fearful, agitated HENT: Pupils 4mm and reactive  Cardiac: Tachycardic, regular, no murmur Pulm: Normal WOB on 2L [place], lungs clear throughout  A/P: Patient coming in with decreased responsiveness likely 2/2 opioid overdose given patient found with multiple fentanyl patches on and response to Narcan. UDS pos for Amphetamines, Benzos, THC as well, though note patient is on Adderall and Xanax at home. - Narcan gtt ordered by admitting team, have discontinued this in light of her present agitation - Anticipate further improvement with the tincture of time  - Will need to address her R hip pain once her mental status improves further. Lidocaine patch and Tylenol are ordered. - Is on chronic benzos, will need to add back at some point so as not to precipitate WDL, but await improvement in mental and respiratory status   - Increasingly hypertensive, restart home lisinopril and d/c IVF - Acutely anxious, will give her evening Seroquel and Effexor    Alicia Amel, MD 11/21/2022, 9:48 PM PGY-3, [facility] Night Resident  Please page [phone] with questions.

## 2022-11-21 NOTE — Assessment & Plan Note (Signed)
Holding home venlafaxine, seroquel, xanax, trazodone as patient is NPO due to somnolence and to avoid multiple drug interaction given possible drug overdose with unknown substances.  Venlafaxine can also cause positive amphetamines on UDS.  - Restart as able

## 2022-11-21 NOTE — H&P (Signed)
Hospital Admission History and Physical Service Pager: [phone]  Patient name: Kara [name] Medical record number: [id number] Date of Birth: 12/22/1955 Age: 67 y.o. Gender: female  Primary Care Provider: [facility] Medical Consultants: Poison Control  Code Status: Full Code, patient unable to consent   Preferred Emergency Contact: Jayleena Claytor, ex-husband separated [phone]   Chief Complaint: Altered mental status   Assessment and Plan: Sherell Whitledge is a 67 y.o. female with PMH chronic pain syndrome, ADHD, anxiety, HTN, fibrmyalgia, RA, presenting with altered mental status/somnolence. Differential for presentation of this includes toxic encephalopathy from drug overdose, sepsis, seizure. Most likely secondary to drug overdose as patient was found with multiple opioid patches on her and UDS was positive for multiple substances.  Less likely is sepsis. Patient was initially hypothermic, leukocytosis, and had elevated lactic acidosis. CT Abdomen pelvis only shows some atelectasis vs aspirate in lungs otherwise no clear source. Aspirate most likely due to this acute event and would cause pneumonitis in the acute timeframe rather than pneumonia. Patient could have had seizures, either due to substances, or new primary epileptic disorder. She did have elevated CK and leukocytosis. However, patient was able to be awakened with noxious stimuli. In the setting of other possible offenders, this cannot be ruled out but less likely. Currently still very lethargic and will need to be admitted.   Assessment & Plan Altered mental status Improved though not resolved with narcan 0.4 mg IV. Most likely toxic encephalopathy d/t drug overdose.  - Admit to FPTS, Progressive, Attending Dr. Manson Passey  - Call poison control  - Consider additional narcan if not continuing to become more alert  - Continue IVF  - F/u ethanol, salicylate, ammonia  - Continue cardiopulmonary monitoring  - Neuro checks q 4   - Fall precautions, seizure precautions  - NPO  - Avoid medications that could alter mental status  - Consider psychiatric evaluation once awake and alert to assess for suicidal ideation.  - Discontinue antibiotics unlikely sepsis, can monitor for signs of infection and restart if necessary  - F/u Bcx   Abdominal tenderness Could be urinary retention in the setting of multiple substances and altered mental status. UA with hgb as expected with CK elevation. Unlikely infectious. No abnormalities on CT abdomen pelvis that would explain.  - Bladder scans q 8  - Continue IVF  - Continue to monitor  Rhabdomyolysis Most likely due to immobility for prolonged time. Could also be due to amphetamines. No renal toxicity at this time. S/p 3L bolus  - Continue 1.5 mL/hr LR  - AM BMP, CK Anxiety and depression Holding home venlafaxine, seroquel, xanax, trazodone as patient is NPO due to somnolence and to avoid multiple drug interaction given possible drug overdose with unknown substances.  Venlafaxine can also cause positive amphetamines on UDS.  - Restart as able  ADD (attention deficit disorder) Patient on 30 mg adderall BID. Cause of positive amphetamines on UDS.  - Hold in the setting of somnolence and possible drug overdose.      FEN/GI: NPO VTE Prophylaxis: Lovenox  Disposition: admit to inpatient, final dispo pending clinical improvement and evaluation   History of Present Illness:  Kara [name] is a 67 y.o. female presenting with somnolence.   History provided by EMS and ED was that patient was supposed to have pre operative appointment for hip replacement today. Patient was not at the appointment, thus, family went to check her house and found her down and unresponsive.   She  had 3 opioid patches on and one "sweet dreams" patch. They then called EMS.    In the ED, patient was somnolent but could be awakened with noxious stimuli and responded to few questions. She was initially  hypothermic to 95.8. She was started on bear hugger. Given her leukocytosis and lactic acid in addition to hypothermia she was called a code sepsis and patient was resuscitated with 3, 1L boluses and given IV vancomycin and cefepime.   Review Of Systems: Per HPI with the following additions: unable to answer is somnolent   Pertinent Past Medical History: Remainder reviewed in history tab.   Pertinent Past Surgical History:  Remainder reviewed in history tab.  Pertinent Social History: Tobacco use: unknown  Alcohol use: unknown  Other Substance use: history per chart previous notes of opioid use   Pertinent Family History: Unknown   Remainder reviewed in history tab.   Important Outpatient Medications: Meloxicam 15 mg daily  Lisinopril 15 mg daily  Xanax prn  Adderall XR 30 every morning  Adderall 30 mg evening  Seroquel 25 mg nightly  Trazodone 50 mg nightly  Venlafaxine 75 mg daily prn (only takes when feels more anxious)  Remainder reviewed in medication history.   Objective: BP (!) 184/121   Pulse (!) 108   Temp 98 F (36.7 C)   Resp 18   Ht 5\' 4"  (1.626 m) Comment: per pt  Wt 73.3 kg   SpO2 100%   BMI 27.74 kg/m  Exam: General: Somnolent HEENT: head autraumatic, mucous membranes dry, no vomitus in or around mouth  Eyes: Pin point pupils ~ 1mm, bilaterally, PERRLA Cardiovascular: RRR, cap refill < 2 seconds, radial pulses palpable bilaterally  Respiratory: Normal work of breathing on 2 L, CTAB in posterior lung fields Gastrointestinal: active bowel sounds, tender with involuntary guarding in middle lower abdomen, otherwise soft and nondistended  MSK: Gross motor movement in tact in all 4 extremities  Neuro: Somnolent, responsive to sternal rub (not to trap squeeze or nail pressure), somewhat responsive to verbal commands afterwards though not consistent, oriented to person, says she is in her apartment, no asymmetry in strength, withdraws from pain,  Psych:  Deferred unable to converse with patient   Labs:  CBC BMET  Recent Labs  Lab 11/21/22 1515  WBC 16.3*  HGB 13.3  HCT 44.3  PLT 417*   Recent Labs  Lab 11/21/22 1515  NA 136  K 3.9  CL 97*  CO2 23  BUN 16  CREATININE 0.63  GLUCOSE 121*  CALCIUM 9.5    Pertinent additional labs: CK 8, 914, UDS positive for THC, aphetamines, opioids, benzodiazepines   EKG: Rate 88, normal sinus rhythm, no ischemic findings, Qtc 437.    Imaging Studies Performed:  CT Head and C Spine  IMPRESSION: 1. No acute intracranial abnormality. 2. No acute fracture or traumatic malalignment of the cervical spine.  CT Abdomen Pelvis    IMPRESSION: Motion degraded images.   Mild patchy opacities in the bilateral upper lobes, possibly atelectasis, although aspiration is not excluded.    Lockie Mola, MD 11/21/2022, 10:01 PM PGY-2, [facility]  FPTS Intern pager: [phone], text pages welcome Secure chat group [facility] Teaching Service

## 2022-11-21 NOTE — Assessment & Plan Note (Signed)
Patient on 30 mg adderall BID. Cause of positive amphetamines on UDS.  - Hold in the setting of somnolence and possible drug overdose.

## 2022-11-21 NOTE — ED Provider Notes (Signed)
[place] EMERGENCY DEPARTMENT AT [facility] Provider Note   CSN: [id number] Arrival date & time: 11/21/22  1406     History  Chief Complaint  Patient presents with   Altered Mental Status   Drug Overdose    Kara [name] is a 67 y.o. female.   Altered Mental Status Drug Overdose  Patient presents for altered mental status.  Medical history includes rheumatoid arthritis, fibromyalgia, opioid use disorder, HTN, depression, arthritis.  She did not show up for her preop visit for planned hip surgery.  Family found her on floor.  She had 3 fentanyl patches on her, as well as another patch that said "3 dreams".  EMS did remove these patches.  Last known well was unknown.  Patient endorses "pain all over".     Home Medications Prior to Admission medications   Medication Sig Start Date End Date Taking? Authorizing Provider  alendronate (FOSAMAX) 35 MG tablet Take 1 tablet (35 mg total) by mouth every 7 (seven) days. Take with a full glass of water on an empty stomach. Patient not taking: Reported on 12/16/2018 04/04/18   Felix Pacini A, DO  ALPRAZolam Prudy Feeler) 1 MG tablet Take 1 mg by mouth daily as needed for anxiety.  10/07/19   [provider]  amphetamine-dextroamphetamine (ADDERALL XR) 30 MG 24 hr capsule Take 60 mg by mouth every morning. 09/30/19   [provider]  amphetamine-dextroamphetamine (ADDERALL) 30 MG tablet Take 30 mg by mouth daily. At 4 pm 09/30/19   [provider]  carbamazepine (TEGRETOL) 200 MG tablet Take 1 tablet (200 mg total) by mouth 3 (three) times daily. Patient not taking: Reported on 10/08/2019 12/20/18   Malvin Johns, MD  cyclobenzaprine (FLEXERIL) 10 MG tablet Take 10 mg by mouth 2 (two) times daily as needed for muscle spasms.  09/25/19   [provider]  HYDROcodone-acetaminophen (NORCO/VICODIN) 5-325 MG tablet Take 1 tablet by mouth every 6 (six) hours as needed. 09/12/22   Darrick Grinder, PA-C  lisinopril  (PRINIVIL,ZESTRIL) 10 MG tablet TAKE 1 AND 1/2 TABLETS DAILY BY MOUTH Patient not taking: Reported on 10/08/2019 03/07/18   Felix Pacini A, DO  meloxicam (MOBIC) 15 MG tablet TAKE 1 TABLET (15 MG) BY MOUTH EVERY DAY Patient taking differently: Take 15 mg by mouth daily.  07/06/17   Kerrin Champagne, MD  oxyCODONE-acetaminophen (PERCOCET/ROXICET) 5-325 MG tablet Take 1 tablet by mouth every 8 (eight) hours as needed for severe pain. 09/13/22   Gerhard Munch, MD  PREMPRO 0.3-1.5 MG tablet Take 1 tablet by mouth daily. 09/22/19   [provider]  QUEtiapine (SEROQUEL) 50 MG tablet Take 1-2 tablets (50-100 mg total) by mouth at bedtime. 12/20/18   Malvin Johns, MD  traZODone (DESYREL) 50 MG tablet Take 50 mg by mouth at bedtime.    [provider]  tretinoin (RETIN-A) 0.1 % cream Apply 1 application topically at bedtime as needed (facial blemishes).  01/28/18   [provider]  valACYclovir (VALTREX) 1000 MG tablet Take 2 tablets (2,000 mg total) by mouth 2 (two) times daily. 07/21/18   Ofilia Neas, PA-C  venlafaxine (EFFEXOR) 37.5 MG tablet Take 37.5 mg by mouth daily.    [provider]  venlafaxine (EFFEXOR) 75 MG tablet Take 75 mg by mouth daily. Only takes when feels more anxious.    [provider]      Allergies    Aspirin, Bee venom, Ibuprofen, Ativan [lorazepam], Ketamine, Toradol [ketorolac tromethamine], and Tramadol hcl  Review of Systems   Review of Systems  Unable to perform ROS: Mental status change    Physical Exam Updated Vital Signs BP (!) 146/102   Pulse 93   Temp (!) 95.7 F (35.4 C) (Rectal)   Resp 12   SpO2 94%  Physical Exam Vitals and nursing note reviewed.  Constitutional:      General: She is not in acute distress.    Appearance: She is well-developed. She is ill-appearing. She is not toxic-appearing or diaphoretic.  HENT:     Head: Normocephalic and atraumatic.     Right Ear: External ear normal.     Left Ear:  External ear normal.     Nose: Nose normal.     Mouth/Throat:     Mouth: Mucous membranes are moist.  Eyes:     Extraocular Movements: Extraocular movements intact.     Conjunctiva/sclera: Conjunctivae normal.  Cardiovascular:     Rate and Rhythm: Normal rate and regular rhythm.     Heart sounds: No murmur heard. Pulmonary:     Effort: Pulmonary effort is normal. No respiratory distress.     Breath sounds: Normal breath sounds. No wheezing or rales.  Abdominal:     General: There is no distension.     Palpations: Abdomen is soft.     Tenderness: There is no abdominal tenderness.  Musculoskeletal:        General: No swelling.     Cervical back: Normal range of motion and neck supple.     Right lower leg: No edema.     Left lower leg: No edema.  Skin:    General: Skin is warm and dry.     Capillary Refill: Capillary refill takes less than 2 seconds.     Coloration: Skin is not jaundiced or pale.  Neurological:     General: No focal deficit present.     Mental Status: She is alert. She is disoriented.  Psychiatric:        Mood and Affect: Mood normal.     ED Results / Procedures / Treatments   Labs (all labs ordered are listed, but only abnormal results are displayed) Labs Reviewed  CBG MONITORING, ED - Abnormal; Notable for the following components:      Result Value   Glucose-Capillary 114 (*)    All other components within normal limits  CULTURE, BLOOD (ROUTINE X 2)  CULTURE, BLOOD (ROUTINE X 2)  CBC WITH DIFFERENTIAL/PLATELET  URINALYSIS, ROUTINE W REFLEX MICROSCOPIC  BASIC METABOLIC PANEL  AMMONIA  HEPATIC FUNCTION PANEL  RAPID URINE DRUG SCREEN, HOSP PERFORMED  ETHANOL  MAGNESIUM  CK  TSH  I-STAT CHEM 8, ED  I-STAT CG4 LACTIC ACID, ED  I-STAT VENOUS BLOOD GAS, ED  TROPONIN I (HIGH SENSITIVITY)    EKG EKG Interpretation Date/Time:  Tuesday November 21 2022 14:12:57 EDT Ventricular Rate:  88 PR Interval:  145 QRS Duration:  86 QT Interval:  361 QTC  Calculation: 437 R Axis:   -4  Text Interpretation: Sinus rhythm Probable left atrial enlargement Inferior infarct, old Confirmed by Gloris Manchester [phone] on 11/21/2022 3:01:15 PM  Radiology No results found.  Procedures Procedures    Medications Ordered in ED Medications  lactated ringers bolus 1,000 mL (1,000 mLs Intravenous New Bag/Given 11/21/22 1447)    ED Course/ Medical Decision Making/ A&P  Medical Decision Making Amount and/or Complexity of Data Reviewed Labs: ordered. Radiology: ordered.   This patient presents to the ED for concern of altered mental status, this involves an extensive number of treatment options, and is a complaint that carries with it a high risk of complications and morbidity.  The differential diagnosis includes head trauma, opiate narcosis, other intoxication, withdrawal, other polypharmacy, infection, azotemia, other metabolic derangements   Co morbidities that complicate the patient evaluation  rheumatoid arthritis, fibromyalgia, opioid use disorder, HTN, depression, arthritis   Additional history obtained:  Additional history obtained from N/A External records from outside source obtained and reviewed including EMR   Lab Tests:  I Ordered, and personally interpreted labs.  The pertinent results include: Normal blood glucose.  Remaining lab work pending at time of signout.   Imaging Studies ordered:  I ordered imaging studies including CT imaging of head, cervical spine, chest, abdomen, pelvis I independently visualized and interpreted imaging which showed (pending at time of signout) I agree with the radiologist interpretation   Cardiac Monitoring: / EKG:  The patient was maintained on a cardiac monitor.  I personally viewed and interpreted the cardiac monitored which showed an underlying rhythm of: Sinus rhythm   Problem List / ED Course / Critical interventions / Medication management  Patient  presenting for altered mental status.  She was found down at home with unknown downtime.  On arrival, she is disoriented.  She has no focal deficits.  She endorses pain all over.  On check a rectal temperature, she is hypothermic.  Bair hugger was ordered.  Patient to undergo broad workup.  CBG was normal.  Remaining workup results pending at time of signout.  Care of patient was signed out to oncoming ED provider. I ordered medication including IV fluids for hydration Reevaluation of the patient after these medicines showed that the patient improved I have reviewed the patients home medicines and have made adjustments as needed   Social Determinants of Health:  Has access to outpatient care        Final Clinical Impression(s) / ED Diagnoses Final diagnoses:  Encephalopathy acute    Rx / DC Orders ED Discharge Orders     None         Gloris Manchester, MD 11/21/22 [phone]

## 2022-11-21 NOTE — Sepsis Progress Note (Signed)
Notified provider of need to order fluid bolus.  ?

## 2022-11-21 NOTE — Progress Notes (Signed)
ED Pharmacy Antibiotic Sign Off An antibiotic consult was received from an ED provider for vancomycin and cefepime per pharmacy dosing for sepsis. A chart review was completed to assess appropriateness.   The following one time order(s) were placed:  Vancomycin 1500 mg IV x 1 Cefepime 2g IV x 1  Further antibiotic and/or antibiotic pharmacy consults should be ordered by the admitting provider if indicated.   Thank you for allowing pharmacy to be a part of this patient's care.   Daylene Posey, [facility]  Clinical Pharmacist 11/21/22 4:18 PM

## 2022-11-21 NOTE — Sepsis Progress Note (Signed)
Notified provider and bedside nurse of need to order and administer fluid bolus pt needs 2151 cc.

## 2022-11-21 NOTE — Hospital Course (Signed)
Chronic pain, found down with lots of fentnyl patches    Lactic acid 4  WBC 16   Never apneic, no pin point pupils

## 2022-11-21 NOTE — Sepsis Progress Note (Signed)
Notified provider and bedside nurse of need to order and draw repeat lactic acid @ 1730.

## 2022-11-21 NOTE — Sepsis Progress Note (Signed)
Elink monitoring for the code sepsis protocol.  

## 2022-11-21 NOTE — ED Notes (Signed)
ED TO INPATIENT HANDOFF REPORT  ED Nurse Name and Phone #: [phone]  S Name/Age/Gender Kara [name] 67 y.o. female Room/Bed: 018C/018C  Code Status   Code Status: Full Code  Home/SNF/Other Home Patient oriented to: self Is this baseline? No   Triage Complete: Triage complete  Chief Complaint Altered mental status [R41.82]  Triage Note Pt bib ems from home, pt was supposed to have pre op testing for hip surgery but never showed up, pt was found in floor by family, pt arousable by voice. EMS found 3 fentanyl patches and a "sweet dreams" patch on her. Pupils 2 and equal. VSS   Allergies Allergies  Allergen Reactions   Aspirin Swelling    Throat swells   Bee Venom Anaphylaxis   Ibuprofen Swelling    Throat swells   Ativan [Lorazepam] Other (See Comments)    Agitation/confusion    Ketamine Other (See Comments)    Hallucinations   Toradol [Ketorolac Tromethamine] Itching, Swelling and Rash   Tramadol Hcl Itching, Swelling and Rash    Tolerates Dilaudid 06/2016.  TDD.    Level of Care/Admitting Diagnosis ED Disposition     ED Disposition  Admit   Condition  --   Comment  Hospital Area: [facility] [[id number]]  Level of Care: Progressive [102]  Admit to Progressive based on following criteria: Other see comments  Comments: altered mental status  May admit patient to [facility] or [facility] if equivalent level of care is available:: No  Covid Evaluation: Asymptomatic - no recent exposure (last 10 days) testing not required  Diagnosis: Altered mental status [780.97.ICD-9-CM]  Admitting Physician: Peterson Ao [[id number]]  Attending Physician: Westley Chandler [[id number]]  Certification:: I certify this patient will need inpatient services for at least 2 midnights  Expected Medical Readiness: 11/24/2022          B Medical/Surgery History Past Medical History:  Diagnosis Date   ADD (attention deficit disorder)    on Adderal   ADD (attention  deficit disorder)    Aggressive behavior of adult    Anemia    Anxiety    Cellulitis of breast 11/2013   RIGHT BREAST   Childhood asthma    as child   Chronic fatigue and immune dysfunction syndrome (HCC)    Chronic lower back pain    Chronic pain    went to [facility] for pain control; stopped in 2016 " (02/23/2017)   Cold sore    Complication of anesthesia    Ketamine makes her hallucinate   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   Post-nasal drip    hx of   Pre-diabetes    patient states she is not Pre-Diabetic at appt. 12/16/2018   PTSD (post-traumatic stress disorder)    RA (rheumatoid arthritis) (HCC)    autoimmune arthritis   RA (rheumatoid arthritis) (HCC)    Spondylitis (HCC)    Suicidal intent    Past Surgical History:  Procedure Laterality Date   ANTERIOR CERVICAL DECOMP/DISCECTOMY FUSION N/A 03/15/2015  Procedure: Cervical five-six, Cerival six-seven, Anterior Cervical Discectomy and Fusion, Allograft and Plate;  Surgeon: Eldred Manges, MD;  Location: [facility];  Service: Orthopedics;  Laterality: N/A;   ANTERIOR LAT LUMBAR FUSION Left 06/11/2018   Procedure: Left Lumbar two-three Anterolateral decompression/fusion/lateral plate fixation;  Surgeon: Barnett Abu, MD;  Location: [facility];  Service: Neurosurgery;  Laterality: Left;  Left Lumbar two-three Anterolateral decompression/fusion/lateral plate fixation   AUGMENTATION MAMMAPLASTY  2003   BACK SURGERY     BLADDER SUSPENSION  2009   BONE EXCISION Right 11/26/2018   Procedure: TRAPEZIUM EXCISION RIGHT THUMB;  Surgeon: Cindee Salt, MD;  Location: [facility];  Service: Orthopedics;  Laterality: Right;  AXILLARY BLOCK   BREAST IMPLANT REMOVAL Bilateral 10/2013   CARPOMETACARPEL SUSPENSION PLASTY Right 11/26/2018   Procedure: Carolynn Serve SUSPENSION;  Surgeon: Cindee Salt, MD;  Location: [facility];  Service: Orthopedics;  Laterality: Right;   CERVICAL WOUND DEBRIDEMENT N/A 07/07/2016   Procedure: IRRIGATION AND DEBRIDEMENT POSTERIOR NECK;  Surgeon: Eldred Manges, MD;  Location: [facility];  Service: Orthopedics;  Laterality: N/A;   COLONOSCOPY     COMBINED ABDOMINOPLASTY AND LIPOSUCTION  2003   INCISION AND DRAINAGE ABSCESS Right 01/16/2014   Procedure: INCISION AND DRAINAGE AND OF RIGHT BREAST ABCESS;  Surgeon: Glenna Fellows, MD;  Location: [facility];  Service: General;  Laterality: Right;   JOINT REPLACEMENT     LUMBAR LAMINECTOMY/DECOMPRESSION MICRODISCECTOMY N/A 12/10/2015   Procedure: Right L3-4 Hemilaminectomy, Excision of herniated nucleus pulposus;  Surgeon: Eldred Manges, MD;  Location[facility];  Service: Orthopedics;  Laterality: N/A;   MASS EXCISION  11/03/2011   Procedure: MINOR EXCISION OF MASS;  Surgeon: Wyn Forster., MD;  Location: [facility];  Service: Orthopedics;  Laterality: Left;  debride IP joint, cyst excision left index   MAXIMUM ACCESS (MAS) TRANSFORAMINAL LUMBAR INTERBODY FUSION (TLIF) 2 LEVEL Right 02/23/2017   POSTERIOR CERVICAL FUSION/FORAMINOTOMY N/A 06/21/2016   Procedure: POSTERIOR CERVICAL FUSION C5-C7 SPINOUS PROCESS WIRING;  Surgeon: Eldred Manges, MD;  Location: [facility];  Service: Orthopedics;  Laterality: N/A;   POSTERIOR LUMBAR FUSION  07/2009; 07/03/2014   "L4-5; L5-S1"   SHOULDER ARTHROSCOPY Right 2012   TENDON TRANSFER Right 11/26/2018   Procedure: TENDON TRANSFER;  Surgeon: Cindee Salt, MD;  Location: [facility];  Service: Orthopedics;  Laterality: Right;   TONSILLECTOMY  1987   TOTAL SHOULDER ARTHROPLASTY  08/01/2011   Procedure:  TOTAL SHOULDER ARTHROPLASTY;  Surgeon: Eulas Post, MD;  Location: [facility];  Service: Orthopedics;  Laterality: Right;  Right total shoulder arthroplasty   TUBAL LIGATION  1988     A IV Location/Drains/Wounds Patient Lines/Drains/Airways Status     Active Line/Drains/Airways     Name Placement date Placement time Site Days   Peripheral IV 11/21/22 20 G Left Antecubital 11/21/22  --  Antecubital  less than 1   Peripheral IV 11/21/22 18 G Right Antecubital 11/21/22  1637  Antecubital  less than 1            Intake/Output Last 24 hours  Intake/Output Summary (Last 24 hours) at 11/21/2022 1951 Last data filed at 11/21/2022 1749 Gross per 24 hour  Intake 2100 ml  Output --  Net 2100 ml    Labs/Imaging Results for orders placed or performed during the hospital encounter of 11/21/22 (from the past 48 hour(s))  CBG monitoring, ED     Status: Abnormal   Collection Time: 11/21/22  2:19 PM  Result Value Ref Range   Glucose-Capillary 114 (H) 70 - 99 mg/dL    Comment: Glucose reference range applies only to samples taken after fasting for at least 8 hours.  Ammonia     Status: None   Collection Time: 11/21/22  2:32 PM  Result Value Ref Range   Ammonia 12 9 - 35 umol/L    Comment: Performed at [facility], [address] [place], [place] [address]  I-Stat Chem 8, ED     Status: Abnormal   Collection Time: 11/21/22  3:08 PM  Result Value Ref Range   Sodium 136 135 - 145 mmol/L   Potassium 4.3 3.5 - 5.1 mmol/L   Chloride 99 98 - 111 mmol/L   BUN 20 8 - 23 mg/dL   Creatinine, Ser 6.04 0.44 - 1.00 mg/dL   Glucose, Bld 540 (H) 70 - 99 mg/dL    Comment: Glucose reference range applies only to samples taken after fasting for at least 8 hours.   Calcium, Ion 1.14 (L) 1.15 - 1.40 mmol/L   TCO2 28 22 - 32 mmol/L   Hemoglobin 14.6 12.0 - 15.0 g/dL   HCT 98.1 19.1 - 47.8 %  I-Stat Lactic Acid, ED     Status: Abnormal   Collection Time: 11/21/22  3:08 PM  Result Value Ref Range    Lactic Acid, Venous 4.0 (HH) 0.5 - 1.9 mmol/L   Comment NOTIFIED PHYSICIAN   I-Stat venous blood gas, ED     Status: Abnormal   Collection Time: 11/21/22  3:08 PM  Result Value Ref Range   pH, Ven 7.379 7.25 - 7.43   pCO2, Ven 49.2 44 - 60 mmHg   pO2, Ven 65 (H) 32 - 45 mmHg   Bicarbonate 29.1 (H) 20.0 - 28.0 mmol/L   TCO2 31 22 - 32 mmol/L   O2 Saturation 92 %   Acid-Base Excess 3.0 (H) 0.0 - 2.0 mmol/L   Sodium 136 135 - 145 mmol/L   Potassium 4.2 3.5 - 5.1 mmol/L   Calcium, Ion 1.13 (L) 1.15 - 1.40 mmol/L   HCT 42.0 36.0 - 46.0 %   Hemoglobin 14.3 12.0 - 15.0 g/dL   Sample type VENOUS   CBC with Differential/Platelet     Status: Abnormal   Collection Time: 11/21/22  3:15 PM  Result Value Ref Range   WBC 16.3 (H) 4.0 - 10.5 K/uL   RBC 5.37 (H) 3.87 - 5.11 MIL/uL   Hemoglobin 13.3 12.0 - 15.0 g/dL   HCT 29.5 62.1 - 30.8 %   MCV 82.5 80.0 - 100.0 fL   MCH 24.8 (L) 26.0 - 34.0 pg   MCHC 30.0 30.0 - 36.0 g/dL   RDW 65.7 84.6 - 96.2 %   Platelets 417 (H) 150 - 400 K/uL   nRBC 0.0 0.0 - 0.2 %   Neutrophils Relative % 91 %   Neutro Abs 14.8 (H) 1.7 - 7.7 K/uL   Lymphocytes Relative 4 %   Lymphs Abs 0.7 0.7 - 4.0 K/uL   Monocytes Relative 4 %   Monocytes Absolute 0.7 0.1 - 1.0 K/uL   Eosinophils Relative 0 %   Eosinophils Absolute 0.0 0.0 - 0.5 K/uL   Basophils Relative 0 %   Basophils Absolute 0.0 0.0 - 0.1 K/uL   Immature Granulocytes 1 %   Abs Immature Granulocytes 0.10 (H) 0.00 - 0.07 K/uL    Comment: Performed at [facility], [address] [place],  [place] [address]  Basic metabolic panel     Status: Abnormal   Collection Time: 11/21/22  3:15 PM  Result Value Ref Range   Sodium 136 135 - 145 mmol/L   Potassium 3.9 3.5 - 5.1 mmol/L   Chloride 97 (L) 98 - 111 mmol/L   CO2 23 22 - 32 mmol/L   Glucose, Bld 121 (H) 70 - 99 mg/dL    Comment: Glucose reference range applies only to samples taken after fasting for at least 8 hours.   BUN 16 8 - 23 mg/dL    Creatinine, Ser 6.04 0.44 - 1.00 mg/dL   Calcium 9.5 8.9 - 54.0 mg/dL   GFR, Estimated >98 >11 mL/min    Comment: (NOTE) Calculated using the CKD-EPI Creatinine Equation (2021)    Anion gap 16 (H) 5 - 15    Comment: Performed at [facility], [address], [place], [place] [address]  Hepatic function panel     Status: Abnormal   Collection Time: 11/21/22  3:15 PM  Result Value Ref Range   Total Protein 7.5 6.5 - 8.1 g/dL   Albumin 3.9 3.5 - 5.0 g/dL   AST 64 (H) 15 - 41 U/L   ALT 29 0 - 44 U/L   Alkaline Phosphatase 102 38 - 126 U/L   Total Bilirubin 0.5 0.3 - 1.2 mg/dL   Bilirubin, Direct <2.9 0.0 - 0.2 mg/dL   Indirect Bilirubin NOT CALCULATED 0.3 - 0.9 mg/dL    Comment: Performed at [facility], [address], [place], [place] [address]  Magnesium     Status: None   Collection Time: 11/21/22  3:15 PM  Result Value Ref Range   Magnesium 2.2 1.7 - 2.4 mg/dL    Comment: Performed at [facility], [address], [place], [place] [address]  CK     Status: Abnormal   Collection Time: 11/21/22  3:15 PM  Result Value Ref Range   Total CK 8,914 (H) 38 - 234 U/L    Comment: RESULT CONFIRMED BY MANUAL DILUTION Performed at [facility], [address], [place], [place] [address]   TSH     Status: Abnormal   Collection Time: 11/21/22  3:40 PM  Result Value Ref Range   TSH 0.262 (L) 0.350 - 4.500 uIU/mL    Comment: Performed by a 3rd Generation assay with a functional sensitivity of <=0.01 uIU/mL. Performed at [facility], [address], [place], [place] [address]   Troponin I (High Sensitivity)     Status: None   Collection Time: 11/21/22  3:40 PM  Result Value Ref Range   Troponin I (High Sensitivity) 8 <18 ng/L    Comment: (NOTE) Elevated high sensitivity troponin I (hsTnI) values and significant  changes across serial measurements may suggest ACS but many other  chronic and acute conditions are known to elevate hsTnI results.  Refer to the  "Links" section for chest pain algorithms and additional  guidance. Performed at [facility], [address] [place], [place] [address]   Urinalysis, Routine w reflex microscopic -Urine, Clean Catch     Status: Abnormal   Collection Time: 11/21/22  3:54 PM  Result Value Ref Range   Color, Urine AMBER (A) YELLOW    Comment: BIOCHEMICALS MAY BE AFFECTED BY COLOR   APPearance HAZY (A) CLEAR   Specific Gravity, Urine 1.009 1.005 - 1.030   pH 6.0 5.0 - 8.0   Glucose, UA  NEGATIVE NEGATIVE mg/dL   Hgb urine dipstick LARGE (A) NEGATIVE   Bilirubin Urine NEGATIVE NEGATIVE   Ketones, ur NEGATIVE NEGATIVE mg/dL   Protein, ur 30 (A) NEGATIVE mg/dL   Nitrite NEGATIVE NEGATIVE   Leukocytes,Ua LARGE (A) NEGATIVE   RBC / HPF 6-10 0 - 5 RBC/hpf   WBC, UA 21-50 0 - 5 WBC/hpf   Bacteria, UA RARE (A) NONE SEEN   Squamous Epithelial / HPF 6-10 0 - 5 /HPF    Comment: Performed at [facility], [address], [place], [place] [address]  Rapid urine drug screen (hospital performed)     Status: Abnormal   Collection Time: 11/21/22  3:54 PM  Result Value Ref Range   Opiates POSITIVE (A) NONE DETECTED   Cocaine NONE DETECTED NONE DETECTED   Benzodiazepines POSITIVE (A) NONE DETECTED   Amphetamines POSITIVE (A) NONE DETECTED   Tetrahydrocannabinol POSITIVE (A) NONE DETECTED   Barbiturates NONE DETECTED NONE DETECTED    Comment: (NOTE) DRUG SCREEN FOR MEDICAL PURPOSES ONLY.  IF CONFIRMATION IS NEEDED FOR ANY PURPOSE, NOTIFY LAB WITHIN 5 DAYS.  LOWEST DETECTABLE LIMITS FOR URINE DRUG SCREEN Drug Class                     Cutoff (ng/mL) Amphetamine and metabolites    1000 Barbiturate and metabolites    200 Benzodiazepine                 200 Opiates and metabolites        300 Cocaine and metabolites        300 THC                            50 Performed at [facility], [address], [place], [place] [address]   Troponin I (High Sensitivity)     Status: None   Collection  Time: 11/21/22  4:58 PM  Result Value Ref Range   Troponin I (High Sensitivity) 8 <18 ng/L    Comment: (NOTE) Elevated high sensitivity troponin I (hsTnI) values and significant  changes across serial measurements may suggest ACS but many other  chronic and acute conditions are known to elevate hsTnI results.  Refer to the "Links" section for chest pain algorithms and additional  guidance. Performed at [facility], [address] [place], [place] [address]    CT HEAD WO CONTRAST  Result Date: 11/21/2022 CLINICAL DATA:  Head trauma, minor (Age >= 65y); Neck trauma (Age >= 65y) EXAM: CT HEAD WITHOUT CONTRAST CT CERVICAL SPINE WITHOUT CONTRAST TECHNIQUE: Multidetector CT imaging of the head and cervical spine was performed following the standard protocol without intravenous contrast. Multiplanar CT image reconstructions of the cervical spine were also generated. RADIATION DOSE REDUCTION: This exam was performed according to the departmental dose-optimization program which includes automated exposure control, adjustment of the mA and/or kV according to patient size and/or use of iterative reconstruction technique. COMPARISON:  CT Head and  c Spine 09/17/22 FINDINGS: CT HEAD FINDINGS Brain: No evidence of acute infarction, hemorrhage, hydrocephalus, extra-axial collection or mass lesion/mass effect. Partially empty sella. Vascular: No hyperdense vessel or unexpected calcification. Skull: Normal. Negative for fracture or focal lesion. Sinuses/Orbits: No middle ear or mastoid effusion. Paranasal sinuses are notable for mild mucosal thickening in the bilateral ethmoid sinuses. Bilateral lens replacement. Orbits are otherwise unremarkable. Other: None. CT CERVICAL SPINE FINDINGS Alignment: Grade 1 anterolisthesis of C2 on  C3 and C3 on C4. Skull base and vertebrae: Postsurgical changes from C5-C7 ACDF with solid osseous fusion. No evidence of perihardware lucency to suggest hardware loosening. Soft  tissues and spinal canal: No prevertebral fluid or swelling. No visible canal hematoma. Disc levels:  No evidence of high-grade spinal canal stenosis. Upper chest: Negative. Other: Negative IMPRESSION: 1. No acute intracranial abnormality. 2. No acute fracture or traumatic malalignment of the cervical spine. Electronically Signed   By: Lorenza Cambridge M.D.   On: 11/21/2022 16:33   CT CERVICAL SPINE WO CONTRAST  Result Date: 11/21/2022 CLINICAL DATA:  Head trauma, minor (Age >= 65y); Neck trauma (Age >= 65y) EXAM: CT HEAD WITHOUT CONTRAST CT CERVICAL SPINE WITHOUT CONTRAST TECHNIQUE: Multidetector CT imaging of the head and cervical spine was performed following the standard protocol without intravenous contrast. Multiplanar CT image reconstructions of the cervical spine were also generated. RADIATION DOSE REDUCTION: This exam was performed according to the departmental dose-optimization program which includes automated exposure control, adjustment of the mA and/or kV according to patient size and/or use of iterative reconstruction technique. COMPARISON:  CT Head and  c Spine 09/17/22 FINDINGS: CT HEAD FINDINGS Brain: No evidence of acute infarction, hemorrhage, hydrocephalus, extra-axial collection or mass lesion/mass effect. Partially empty sella. Vascular: No hyperdense vessel or unexpected calcification. Skull: Normal. Negative for fracture or focal lesion. Sinuses/Orbits: No middle ear or mastoid effusion. Paranasal sinuses are notable for mild mucosal thickening in the bilateral ethmoid sinuses. Bilateral lens replacement. Orbits are otherwise unremarkable. Other: None. CT CERVICAL SPINE FINDINGS Alignment: Grade 1 anterolisthesis of C2 on C3 and C3 on C4. Skull base and vertebrae: Postsurgical changes from C5-C7 ACDF with solid osseous fusion. No evidence of perihardware lucency to suggest hardware loosening. Soft tissues and spinal canal: No prevertebral fluid or swelling. No visible canal hematoma. Disc  levels:  No evidence of high-grade spinal canal stenosis. Upper chest: Negative. Other: Negative IMPRESSION: 1. No acute intracranial abnormality. 2. No acute fracture or traumatic malalignment of the cervical spine. Electronically Signed   By: Lorenza Cambridge M.D.   On: 11/21/2022 16:33   CT CHEST ABDOMEN PELVIS WO CONTRAST  Result Date: 11/21/2022 CLINICAL DATA:  Altered mental status, drug overdose EXAM: CT CHEST, ABDOMEN AND PELVIS WITHOUT CONTRAST TECHNIQUE: Multidetector CT imaging of the chest, abdomen and pelvis was performed following the standard protocol without IV contrast. RADIATION DOSE REDUCTION: This exam was performed according to the departmental dose-optimization program which includes automated exposure control, adjustment of the mA and/or kV according to patient size and/or use of iterative reconstruction technique. COMPARISON:  CTA chest dated 12/13/2015. CT abdomen/pelvis dated 09/21/2015. FINDINGS: Motion degraded images. CT CHEST FINDINGS Cardiovascular: The heart is normal in size. No pericardial effusion. No evidence of thoracic aortic aneurysm. Atherosclerotic calcifications of the aortic arch. Mediastinum/Nodes: Calcified subcarinal node, within normal limits. No suspicious mediastinal lymphadenopathy. Visualized thyroid is unremarkable. Lungs/Pleura: Evaluation of the lung parenchyma is constrained by respiratory motion. Within that constraint, there are no suspicious pulmonary nodules. Calcified left upper lobe granuloma (series 4/image 7), poorly visualized but benign. Mild patchy opacities in the bilateral upper lobes, possibly atelectasis, although aspiration is not excluded. Mild patchy opacity in the medial right middle lobe, poorly visualized/evaluated. Mild dependent opacity in the bilateral lower lobes, right greater than left, likely atelectasis. No pleural effusion or pneumothorax. Musculoskeletal: Mild degenerative changes of the lower thoracic spine. CT ABDOMEN PELVIS  FINDINGS Hepatobiliary: Unenhanced liver is notable for a 10 mm left hepatic lobe cyst. Gallbladder  is grossly unremarkable. No intrahepatic or extrahepatic ductal dilatation. Pancreas: Grossly unremarkable. Spleen: Calcified splenic granuloma. Adrenals/Urinary Tract: Adrenal glands are within normal limits. Kidneys grossly unremarkable.  No renal calculi or hydronephrosis. Bladder is within normal limits. Stomach/Bowel: Stomach is within normal limits. No evidence of bowel obstruction. Normal appendix (series 3/image 32). No colonic wall thickening or inflammatory changes. Vascular/Lymphatic: No evidence of abdominal aortic aneurysm. Atherosclerotic calcifications of the abdominal aorta and branch vessels. No suspicious abdominopelvic lymphadenopathy. Reproductive: Uterus is within normal limits. Bilateral ovaries are within normal limits. Other: No abdominopelvic ascites. Musculoskeletal: Postsurgical changes at L2-S1. Mild degenerative changes of the lumbar spine. IMPRESSION: Motion degraded images. Mild patchy opacities in the bilateral upper lobes, possibly atelectasis, although aspiration is not excluded. Additional ancillary findings as above. Electronically Signed   By: Charline Bills M.D.   On: 11/21/2022 16:33    Pending Labs Unresulted Labs (From admission, onward)     Start     Ordered   11/22/22 0500  Comprehensive metabolic panel  Tomorrow morning,   R        11/21/22 1939   11/22/22 0500  CBC  Tomorrow morning,   R        11/21/22 1939   11/21/22 1850  HIV Antibody (routine testing w rflx)  (HIV Antibody (Routine testing w reflex) panel)  Once,   R        11/21/22 1939   11/21/22 1707  Lactic acid, plasma  (Lactic Acid)  Now then every 2 hours,   R (with STAT occurrences)      11/21/22 1706   11/21/22 1434  Blood culture (routine x 2)  BLOOD CULTURE X 2,   R (with STAT occurrences)      11/21/22 1433   11/21/22 1432  Ethanol  Once,   URGENT        11/21/22 1433             Vitals/Pain Today's Vitals   11/21/22 1422 11/21/22 1600 11/21/22 1758 11/21/22 1800  BP:  (!) 151/97  (!) 157/97  Pulse:  86  96  Resp:  17  11  Temp: (!) 95.7 F (35.4 C)  98.2 F (36.8 C)   TempSrc: Rectal  Rectal   SpO2:  95%  99%    Isolation Precautions No active isolations  Medications Medications  enoxaparin (LOVENOX) injection 40 mg (has no administration in time range)  lactated ringers bolus 1,000 mL (has no administration in time range)  naloxone HCl (NARCAN) 4 mg in dextrose 5 % 250 mL infusion (has no administration in time range)  lactated ringers infusion (has no administration in time range)  lactated ringers bolus 1,000 mL (0 mLs Intravenous Stopped 11/21/22 1538)  ceFEPIme (MAXIPIME) 2 g in sodium chloride 0.9 % 100 mL IVPB (0 g Intravenous Stopped 11/21/22 1658)  vancomycin (VANCOREADY) IVPB 1500 mg/300 mL (0 mg Intravenous Stopped 11/21/22 1933)  lactated ringers bolus 1,000 mL (0 mLs Intravenous Stopped 11/21/22 1749)  naloxone (NARCAN) injection 0.4 mg (0.4 mg Intravenous Given 11/21/22 1852)    Mobility walks with person assist     Focused Assessments N/a   R Recommendations: See Admitting Provider Note  Report given to:   Additional Notes: n/A

## 2022-11-21 NOTE — ED Notes (Signed)
[phone] Kara [name] pt husband called for a update.

## 2022-11-21 NOTE — Assessment & Plan Note (Signed)
Improved though not resolved with narcan 0.4 mg IV. Most likely toxic encephalopathy d/t drug overdose.  - Admit to FPTS, Progressive, Attending Dr. Manson Passey  - Call poison control  - Consider additional narcan if not continuing to become more alert  - Continue IVF  - F/u ethanol, salicylate, ammonia  - Continue cardiopulmonary monitoring  - Neuro checks q 4  - Fall precautions, seizure precautions  - NPO  - Avoid medications that could alter mental status  - Consider psychiatric evaluation once awake and alert to assess for suicidal ideation.  - Discontinue antibiotics unlikely sepsis, can monitor for signs of infection and restart if necessary  - F/u Bcx

## 2022-11-21 NOTE — Assessment & Plan Note (Signed)
Could be urinary retention in the setting of multiple substances and altered mental status. UA with hgb as expected with CK elevation. Unlikely infectious. No abnormalities on CT abdomen pelvis that would explain.  - Bladder scans q 8  - Continue IVF  - Continue to monitor

## 2022-11-21 NOTE — ED Notes (Signed)
Pt taken off of bear hugger

## 2022-11-22 ENCOUNTER — Inpatient Hospital Stay (HOSPITAL_COMMUNITY): Payer: Medicare Other

## 2022-11-22 DIAGNOSIS — R451 Restlessness and agitation: Secondary | ICD-10-CM | POA: Diagnosis not present

## 2022-11-22 DIAGNOSIS — F419 Anxiety disorder, unspecified: Secondary | ICD-10-CM | POA: Diagnosis not present

## 2022-11-22 DIAGNOSIS — T40411A Poisoning by fentanyl or fentanyl analogs, accidental (unintentional), initial encounter: Secondary | ICD-10-CM

## 2022-11-22 DIAGNOSIS — M6282 Rhabdomyolysis: Secondary | ICD-10-CM

## 2022-11-22 DIAGNOSIS — F1190 Opioid use, unspecified, uncomplicated: Secondary | ICD-10-CM

## 2022-11-22 DIAGNOSIS — F32A Depression, unspecified: Secondary | ICD-10-CM | POA: Diagnosis not present

## 2022-11-22 DIAGNOSIS — G9340 Encephalopathy, unspecified: Secondary | ICD-10-CM

## 2022-11-22 DIAGNOSIS — R200 Anesthesia of skin: Secondary | ICD-10-CM | POA: Diagnosis present

## 2022-11-22 LAB — COMPREHENSIVE METABOLIC PANEL
ALT: 35 U/L (ref 0–44)
AST: 70 U/L — ABNORMAL HIGH (ref 15–41)
Albumin: 2.8 g/dL — ABNORMAL LOW (ref 3.5–5.0)
Alkaline Phosphatase: 78 U/L (ref 38–126)
Anion gap: 10 (ref 5–15)
BUN: 11 mg/dL (ref 8–23)
CO2: 26 mmol/L (ref 22–32)
Calcium: 8.8 mg/dL — ABNORMAL LOW (ref 8.9–10.3)
Chloride: 103 mmol/L (ref 98–111)
Creatinine, Ser: 0.92 mg/dL (ref 0.44–1.00)
GFR, Estimated: >60 mL/min (ref >60)
Glucose, Bld: 116 mg/dL — ABNORMAL HIGH (ref 70–99)
Potassium: 3.8 mmol/L (ref 3.5–5.1)
Sodium: 139 mmol/L (ref 135–145)
Total Bilirubin: 0.5 mg/dL (ref 0.3–1.2)
Total Protein: 5.5 g/dL — ABNORMAL LOW (ref 6.5–8.1)

## 2022-11-22 LAB — CBC
HCT: 37.9 % (ref 36.0–46.0)
Hemoglobin: 11.6 g/dL — ABNORMAL LOW (ref 12.0–15.0)
MCH: 24.9 pg — ABNORMAL LOW (ref 26.0–34.0)
MCHC: 30.6 g/dL (ref 30.0–36.0)
MCV: 81.3 fL (ref 80.0–100.0)
Platelets: 416 10*3/uL — ABNORMAL HIGH (ref 150–400)
RBC: 4.66 MIL/uL (ref 3.87–5.11)
RDW: 15.6 % — ABNORMAL HIGH (ref 11.5–15.5)
WBC: 15 10*3/uL — ABNORMAL HIGH (ref 4.0–10.5)
nRBC: 0 % (ref 0.0–0.2)

## 2022-11-22 LAB — HIV ANTIBODY (ROUTINE TESTING W REFLEX): HIV Screen 4th Generation wRfx: NONREACTIVE

## 2022-11-22 LAB — CK: Total CK: 6548 U/L — ABNORMAL HIGH (ref 38–234)

## 2022-11-22 LAB — LACTIC ACID, PLASMA
Lactic Acid, Venous: 2.2 mmol/L (ref 0.5–1.9)
Lactic Acid, Venous: 3.5 mmol/L (ref 0.5–1.9)

## 2022-11-22 LAB — RPR: RPR Ser Ql: NONREACTIVE

## 2022-11-22 LAB — ACETAMINOPHEN LEVEL: Acetaminophen (Tylenol), Serum: <10 ug/mL — ABNORMAL LOW (ref 10–30)

## 2022-11-22 LAB — ETHANOL: Alcohol, Ethyl (B): <10 mg/dL (ref <10)

## 2022-11-22 LAB — SALICYLATE LEVEL: Salicylate Lvl: <7 mg/dL — ABNORMAL LOW (ref 7.0–30.0)

## 2022-11-22 MED ORDER — LIDOCAINE HCL URETHRAL/MUCOSAL 2 % EX GEL
1.0000 | Freq: Once | CUTANEOUS | Status: DC
  Filled 2022-11-22: qty 6

## 2022-11-22 MED ORDER — FOLIC ACID 1 MG PO TABS
1.0000 mg | ORAL_TABLET | Freq: Every day | ORAL | Status: DC
  Administered 2022-11-22 – 2022-11-23 (×2): 1 mg via ORAL
  Filled 2022-11-22 (×2): qty 1

## 2022-11-22 MED ORDER — THIAMINE HCL 100 MG/ML IJ SOLN: 100.0000 mg | Freq: Every day | INTRAMUSCULAR | Status: DC

## 2022-11-22 MED ORDER — HALOPERIDOL 0.5 MG PO TABS: 1.0000 mg | ORAL_TABLET | Freq: Once | ORAL | Status: AC

## 2022-11-22 MED ORDER — ORAL CARE MOUTH RINSE: 15.0000 mL | OROMUCOSAL | Status: DC | PRN

## 2022-11-22 MED ORDER — ZIPRASIDONE MESYLATE 20 MG IM SOLR
20.0000 mg | INTRAMUSCULAR | Status: DC | PRN
  Filled 2022-11-22: qty 20

## 2022-11-22 MED ORDER — HALOPERIDOL LACTATE 5 MG/ML IJ SOLN
5.0000 mg | Freq: Once | INTRAMUSCULAR | Status: AC
  Administered 2022-11-22: 5 mg via INTRAMUSCULAR
  Filled 2022-11-22: qty 1

## 2022-11-22 MED ORDER — HALOPERIDOL 0.5 MG PO TABS
5.0000 mg | ORAL_TABLET | Freq: Three times a day (TID) | ORAL | Status: DC | PRN
  Administered 2022-11-23: 5 mg via ORAL
  Filled 2022-11-22: qty 10

## 2022-11-22 MED ORDER — LORAZEPAM 1 MG PO TABS
1.0000 mg | ORAL_TABLET | ORAL | Status: DC | PRN
  Filled 2022-11-22: qty 1

## 2022-11-22 MED ORDER — OLANZAPINE 5 MG PO TBDP: 10.0000 mg | ORAL_TABLET | ORAL | Status: DC | PRN

## 2022-11-22 MED ORDER — PHENOBARBITAL 32.4 MG PO TABS
97.2000 mg | ORAL_TABLET | Freq: Three times a day (TID) | ORAL | Status: DC
  Administered 2022-11-22 – 2022-11-23 (×4): 97.2 mg via ORAL
  Filled 2022-11-22 (×4): qty 3

## 2022-11-22 MED ORDER — LACTATED RINGERS IV SOLN: INTRAVENOUS | Status: DC

## 2022-11-22 MED ORDER — THIAMINE MONONITRATE 100 MG PO TABS
100.0000 mg | ORAL_TABLET | Freq: Every day | ORAL | Status: DC
  Administered 2022-11-22 – 2022-11-23 (×2): 100 mg via ORAL
  Filled 2022-11-22 (×2): qty 1

## 2022-11-22 MED ORDER — HALOPERIDOL LACTATE 5 MG/ML IJ SOLN: 5.0000 mg | Freq: Three times a day (TID) | INTRAMUSCULAR | Status: DC | PRN

## 2022-11-22 MED ORDER — PHENOBARBITAL 32.4 MG PO TABS: 32.4000 mg | ORAL_TABLET | Freq: Three times a day (TID) | ORAL | Status: DC

## 2022-11-22 MED ORDER — LORAZEPAM 2 MG/ML IJ SOLN: 2.0000 mg | Freq: Once | INTRAMUSCULAR | Status: DC

## 2022-11-22 MED ORDER — CLONIDINE HCL 0.1 MG PO TABS
0.1000 mg | ORAL_TABLET | Freq: Three times a day (TID) | ORAL | Status: DC
  Administered 2022-11-22 – 2022-11-23 (×4): 0.1 mg via ORAL
  Filled 2022-11-22 (×4): qty 1

## 2022-11-22 MED ORDER — LORAZEPAM 2 MG/ML IJ SOLN: 1.0000 mg | INTRAMUSCULAR | Status: DC | PRN

## 2022-11-22 MED ORDER — OLANZAPINE 5 MG PO TBDP: 10.0000 mg | ORAL_TABLET | Freq: Three times a day (TID) | ORAL | Status: DC | PRN

## 2022-11-22 MED ORDER — HALOPERIDOL 0.5 MG PO TABS
1.0000 mg | ORAL_TABLET | Freq: Once | ORAL | Status: DC
Start: 2022-11-22 — End: 2022-11-22

## 2022-11-22 MED ORDER — ADULT MULTIVITAMIN W/MINERALS CH
1.0000 | ORAL_TABLET | Freq: Every day | ORAL | Status: DC
  Administered 2022-11-22 – 2022-11-23 (×2): 1 via ORAL
  Filled 2022-11-22 (×2): qty 1

## 2022-11-22 MED ORDER — OLANZAPINE 10 MG IM SOLR: 10.0000 mg | INTRAMUSCULAR | Status: DC | PRN

## 2022-11-22 MED ORDER — LORAZEPAM 2 MG/ML IJ SOLN: 2.0000 mg | Freq: Three times a day (TID) | INTRAMUSCULAR | Status: DC | PRN

## 2022-11-22 MED ORDER — LORAZEPAM 1 MG PO TABS: 2.0000 mg | ORAL_TABLET | Freq: Three times a day (TID) | ORAL | Status: DC | PRN

## 2022-11-22 MED ORDER — LORAZEPAM 1 MG PO TABS: 1.0000 mg | ORAL_TABLET | ORAL | Status: DC | PRN

## 2022-11-22 MED ORDER — PHENOBARBITAL 32.4 MG PO TABS: 64.8000 mg | ORAL_TABLET | Freq: Three times a day (TID) | ORAL | Status: DC

## 2022-11-22 MED ORDER — HALOPERIDOL LACTATE 5 MG/ML IJ SOLN
1.0000 mg | Freq: Once | INTRAMUSCULAR | Status: DC
Start: 2022-11-22 — End: 2022-11-22

## 2022-11-22 MED ORDER — LORAZEPAM 1 MG PO TABS: 2.0000 mg | ORAL_TABLET | Freq: Once | ORAL | Status: DC

## 2022-11-22 NOTE — Assessment & Plan Note (Signed)
Unclear whether acute or chronic. CT head/neck on admission negative for acute changes. Unable to complete exam due to patient agitation -will reassess once patient is more stable

## 2022-11-22 NOTE — Assessment & Plan Note (Signed)
Elevated pressures to 190s/120s overnight. Improved this morning. -home Lisinopril 15mg  daily -clonidine as above

## 2022-11-22 NOTE — Plan of Care (Signed)
  Problem: Clinical Measurements: Goal: Respiratory complications will improve Outcome: Progressing Goal: Cardiovascular complication will be avoided Outcome: Progressing   Problem: Nutrition: Goal: Adequate nutrition will be maintained Outcome: Progressing   Problem: Elimination: Goal: Will not experience complications related to bowel motility Outcome: Progressing Goal: Will not experience complications related to urinary retention Outcome: Progressing   Problem: Skin Integrity: Goal: Risk for impaired skin integrity will decrease Outcome: Progressing

## 2022-11-22 NOTE — Progress Notes (Addendum)
1130: put her in bedpan and asked if she can void, she voided, bladder scan done after that which showed 40ml.  1200: Patient was crying, trying to get out of bed, told her this nurse is going to give haldol and ativan, to calm her down, she refused for ativan, so gave her haldol IM only, she was asking for pain med complaining of hip pain, got order for K-pad, waiting for the pad to be delivered to the unit.   12:30: Got order for Phenobarbitone, patient is resting, so,  will give her to rest for a while, will give the med later. Will continue to monitor
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
## 2022-11-22 NOTE — Progress Notes (Addendum)
MEDICATION RELATED CONSULT NOTE - INITIAL   Pharmacy Consult for phenobarbital  Indication: polysubstance withdrawal  Allergies  Allergen Reactions   Aspirin Swelling    Throat swells   Bee Venom Anaphylaxis   Ibuprofen Swelling    Throat swells   Ativan [Lorazepam] Other (See Comments)    Agitation/confusion    Ketamine Other (See Comments)    Hallucinations   Toradol [Ketorolac Tromethamine] Itching, Swelling and Rash   Tramadol Hcl Itching, Swelling and Rash    Tolerates Dilaudid 06/2016.  TDD.   Patient Measurements: Height: 5\' 4"  (162.6 cm) (per pt) Weight: 73.3 kg (161 lb 9.6 oz) IBW/kg (Calculated) : 54.7  Vital Signs: Temp: 98.2 F (36.8 C) (08/28 1152) Temp Source: Oral (08/28 1152) BP: 143/77 (08/28 1152) Pulse Rate: 80 (08/28 1152)  Labs: Recent Labs    11/21/22 1508 11/21/22 1515 11/22/22 0639  WBC  --  16.3* 15.0*  HGB 14.6  14.3 13.3 11.6*  HCT 43.0  42.0 44.3 37.9  PLT  --  417* 416*  CREATININE 0.50 0.63 0.92  MG  --  2.2  --   ALBUMIN  --  3.9 2.8*  PROT  --  7.5 5.5*  AST  --  64* 70*  ALT  --  29 35  ALKPHOS  --  102 78  BILITOT  --  0.5 0.5  BILIDIR  --  <0.1  --   IBILI  --  NOT CALCULATED  --    Estimated Creatinine Clearance: 59 mL/min (by C-G formula based on SCr of 0.92 mg/dL).  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)    Chronic lower back pain    Chronic pain    went to [facility] for pain control; stopped in 2016 " (02/23/2017)   Cold sore    Complication of anesthesia    Ketamine makes her hallucinate   Confusion caused by a drug    methotrexate and autoimmune disease    Degenerative disc disease, lumbar     Depression    takes meds daily   Family history of malignant neoplasm of breast    Fibromyalgia    Fibromyalgia    H/O degenerative disc disease    History of kidney stones    Hypertension    Osteoporosis    Osteoporosis    Other specified rheumatoid arthritis, 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   Post-nasal drip    hx of   Pre-diabetes    patient states she is not Pre-Diabetic at appt. 12/16/2018   PTSD (post-traumatic stress disorder)    RA (rheumatoid arthritis) (HCC)    autoimmune arthritis   RA (rheumatoid arthritis) (HCC)    Spondylitis (HCC)    Suicidal intent     Medications:  Scheduled:   acetaminophen  650 mg Oral Q6H   cloNIDine  0.1 mg Oral TID   enoxaparin (LOVENOX) injection  40 mg Subcutaneous Q24H   folic acid  1 mg Oral Daily   lidocaine  1 patch Transdermal Q24H   lidocaine  1 Application Other Once   lisinopril  15 mg Oral Daily  multivitamin with minerals  1 tablet Oral Daily   phenobarbital  97.2 mg Oral Q8H   Followed by   [name] ON 11/24/2022] phenobarbital  64.8 mg Oral Q8H   Followed by   [name] ON 11/26/2022] phenobarbital  32.4 mg Oral Q8H   QUEtiapine  25 mg Oral QHS   thiamine  100 mg Oral Daily   Or   thiamine  100 mg Intravenous Daily   venlafaxine XR  75 mg Oral Daily   Infusions:   lactated ringers 125 mL/hr at 11/22/22 0321   PRN: haloperidol **OR** haloperidol lactate, LORazepam **OR** LORazepam, LORazepam **OR** LORazepam, mouth rinse  Assessment: Kara [name], a 67 yo female, was admitted for drug overdose with fentanyl. Patient became highly agitated this morning with CIWA score of 21 and COWS score of 6. Pharmacy was consulted to initiate phenobarbital for polysubstance withdrawal. Patient was on Xanax 1 mg BID PRN, Adderall XR 60 mg (AM), Adderall 30 mg (evening), Seroquel 25 mg, trazodone 50 mg, zolpidem 10 mg, and Effexor-XR 75 mg at home per dispensing records.   Plan:  Phenobarbital PO 97.2 mg q8h x 6 doses, then 64.8  mg q8h x 6 doses, then 32.4 mg q8h x 6 doses Monitor COWS and CIWA scores Monitor for oversedation and CNS depression  Kara [name], PharmD PGY1 Pharmacy Resident 11/22/2022 2:35 PM

## 2022-11-22 NOTE — Assessment & Plan Note (Addendum)
Most likely due to immobility for prolonged time. Could also be due to amphetamines. No renal toxicity at this time. S/p 3L bolus. CK trending down. - Continue 120mL/hr LR - Will repeat CK once patient is more stable - AM BMP

## 2022-11-22 NOTE — Progress Notes (Signed)
Patient is retaining 673 ml of urine as shown in bladder scan, this RN asked her for In and out cath, she refused and started crying, talked to her about using some numbing gel before doing the procedure, she agreed, but later on refused again, will continue to monitor

## 2022-11-22 NOTE — Progress Notes (Signed)
Notified by RN Luna Glasgow that patient had pink urine output with 567 mL residual volume on bladder scan.  When assessed patient, her mental status had improved from before and she was alert to self, place, and situation.  Still with tearful affect, complains of pain in R hip region.  Still somewhat drowsy, though more alert overall.  Of note, states that she has not taken her Xanax in several weeks.  Patient was able to urinate again, but still with >400 mL residual volume on rescan.  Asked RN to administer previously held evening dose of quetiapine 25 mg and then perform I&O cath.  Pink urine unsurprising in setting of elevated CK, likely has component of rhabdo, will recheck CK trend in AM.  Will continue to monitor with bladder scans and I&O one more time prior to Foley placement if needed.

## 2022-11-22 NOTE — Assessment & Plan Note (Signed)
Pt has not been taking xanax for several weeks. Holding home trazodone to avoid excess medication interactions as pt remains altered. Restarted home seroquel and venlafaxine due to acute anxiety overnight. - continue home seroquel 25mg  and venlafaxine 75mg 

## 2022-11-22 NOTE — Assessment & Plan Note (Addendum)
Initial somnolence/drowsiness likely secondary to toxic encephalopathy d/t drug overdose with fentanyl. Pt has history of suicidal ideation and attempts via drug overdose. Highly agitated this AM with CIWA score 21. Differential includes benzodiazepine withdrawal vs primary psychiatric disease. - Psych consulted, pending recommendations - ICU consulted for concern for polysubstance withdrawal, pending recommendations - clonidine 0.1mg  TID to help with opiate withdrawal and BP - Continue cardiopulmonary monitoring  - continue CIWA, COWS q 4 - Neuro checks q 4  - Fall precautions, seizure precautions  - Avoid medications that could alter mental status  - Monitor for signs of infection and restart ABX if necessary  - F/u Bcx - Ng x 24 hours - pending HIV

## 2022-11-22 NOTE — Progress Notes (Signed)
Pt admitted from ER. Alert and crying on arrival, answers questions in between crying but confused about situation.  When pt left alone, pt falls asleep with audible snoring heard. When staff wake pt she begins crying and c/o pain to right hip. Follows directions and answers some questions but difficult to understand due to crying. Heat packs applied to hip and repositioned. Pt fell back asleep with continued snoring.   Later when RN woke pt, she expressed need to void, assisted on bedpan and voided pink urine but post void bladder scan = 567. Pt refusing urinary catheter. MD notified and at bedside to see pt. Pt voided more and when MD at bedside post void still .  Pt agreeable to I&O cath after her seroquel.  Per MD seroquel given and gave time for it to take effect. Pt back asleep within minutes of med admin.  Later RN woke pt and she immediately began crying and asking for food. Drinking apple juice and eating pudding without difficulty but falling asleep during so and needing RN to keep pt awake. Still unable to void more and bladder scan = 507. At first refusing cath but then agreed after RN explained cath and complications of not draining bladder. Cath done and pt tolerated well, amber, pink tinged urine returned.   Pt returned to sleep, loud snoring. Occasionally waking up and yelling about pain but usually sleeping/snoring by the time RN enters room. Later more awake and yelling more frequently about pain; MD notified and order received for clonidine for opioid w/d.   Later pt keeps complaining of something on bottom of left foot although nothing is there (socks also have been off all night). Pt c/o tingling left foot and denies having this at home. Still unable to dorsiflex left foot - MD notified and at bedside to assess. Pt still falling back asleep frequently and quickly.

## 2022-11-22 NOTE — Consult Note (Signed)
NAME:  Kara [name], MRN:  [id number], DOB:  1956-02-23, LOS: 1 ADMISSION DATE:  11/21/2022, CONSULTATION DATE:  08/28 REFERRING MD:  Ellwood Dense DO, CHIEF COMPLAINT:   Agitation  History of Present Illness:  Kara [name] is a 67 y.o. F pertinent PMH of chronic pain, PTSD, borderline personality disorder, alcohol use disorder, cannabis dependence who presented with AMS suspected to be related to excess fentanyl patch use [found to have multiple fentanyl patches on her], UDS positive for amphetamines, benzos, opiates and THC. She progressed into medication withdrawal with significant agitation and PCCM called for help with management.  Pertinent  Medical History    has a past medical history of ADD (attention deficit disorder), ADD (attention deficit disorder), Aggressive behavior of adult, Anemia, Anxiety, Cellulitis of breast (11/2013), Childhood asthma, Chronic fatigue and immune dysfunction syndrome (HCC), Chronic lower back pain, Chronic pain, Cold sore, Complication of anesthesia, Confusion caused by a drug, Degenerative disc disease, lumbar, Depression, Family history of malignant neoplasm of breast, Fibromyalgia, Fibromyalgia, H/O degenerative disc disease, History of kidney stones, Hypertension, Osteoporosis, Osteoporosis, Other specified rheumatoid arthritis, right shoulder (HCC) (08/01/2011), Pneumonia (2010?), Post-nasal drip, Pre-diabetes, PTSD (post-traumatic stress disorder), RA (rheumatoid arthritis) (HCC), RA (rheumatoid arthritis) (HCC), Spondylitis (HCC), and Suicidal intent.   Significant Hospital Events: Including procedures, antibiotic start and stop dates in addition to other pertinent events     Interim History / Subjective:    Objective   Blood pressure (!) 126/90, pulse 91, temperature 98.1 F (36.7 C), temperature source Oral, resp. rate 17, height 5\' 4"  (1.626 m), weight 73.3 kg, SpO2 100%.        Intake/Output Summary (Last 24 hours) at 11/22/2022 1018 Last data  filed at 11/22/2022 0245 Gross per 24 hour  Intake 3340 ml  Output 1500 ml  Net 1840 ml   Filed Weights   11/21/22 2128  Weight: 73.3 kg    Examination: Gen:      No acute distress. Mild distress HEENT:  EOMI, sclera anicteric Neck:     No masses; no thyromegaly Lungs:    Clear to auscultation bilaterally; normal respiratory effort CV:         Regular rate and rhythm; no murmurs Abd:      + bowel sounds; soft, non-tender; no palpable masses, no distension Ext:    No edema; adequate peripheral perfusion Skin:      Warm and dry; no rash Neuro: Agitation, wants something for anxiety  Resolved Hospital Problem list     Assessment & Plan:  Altered mental status Complicated and in setting of polypharmacy including xanax, fentanyl. Has history of EtOH abuse, last drink . Ethanol on arrival <10, acetaminophen and salicylate levels negative. UDS positive for opiates, benzodiazepines, amphetamines, THC. Most recent CIWA score 21, COWS 5. She has not received any ativan at this point. Suspect multifactorial etiology largely driven by medication withdrawal and psychiatric causes. No intracranial pathology noted on head imaging. Plan: -Recommend trial of CIWA w/ ativan (has not received any yet) vs zyprexa+ativan+geodon combination (ordered) - Order PO phenobarb taper - Continue COWS, CIWA monitoring -Continue safety precautions -Continue telemetry -F/u psychiatry recommendations (consult placed by FMTS) -Hold home adderall, seroquel, fentanyl, venlafaxine, xanax, trazodone  Rhabdomyolysis CK downtrending 4098>1191. Renal function stable. She is receiving large volume IVF and bladder scans due to retention  Plan: -Continue IVF -Continue bladder scans  SIRS Lactic acidosis Tachycardic with WBC 15.0. Temperature has been WNL. Lactic acid 4.0>2.2. CT chest noted mild patchy opacities in bilateral  upper lobes, possibly atelectasis, though aspiration not excluded. While she may have  aspirated during overdose, I suspect of this inflammation is likely attributed to period of decreased perfusion during overdose and agitation. Plan: -Ongoing monitoring per primary  Remainder per primary-- Suppressed TSH LLE weakness Abdominal pain  Best Practice (right click and "Reselect all SmartList Selections" daily)   Per primary team  Signature:    Chilton Greathouse MD [facility] See Amion for pager  If no response to pager , please call [phone] until 7pm After 7:00 pm call Elink  [phone] 11/22/2022, 1:28 PM

## 2022-11-22 NOTE — Consult Note (Signed)
[facility] Consult Evaluation  Service Date: November 22, 2022 LOS:  LOS: 1 day    Primary Psychiatric Diagnoses  Opioid use Agitation MDD  Assessment  Kara [name] is a 67 y.o. female admitted medically on 11/21/2022  2:06 PM for AMS. She carries the psychiatric diagnoses of ADHD, anxiety, and opioid use and has a past medical history of chronic pain syndrome, fibromyalgia, RA.Psychiatry was consulted for "Pt found down with 3 fentanyl patches...assistance managing primary psychiatric disease."  by Dr. Manson Passey on 8/28.    Her current presentation of agitation in the context of overdosing on fentanyl patches puts her at risk of opioid withdrawal.  This is in the context of the patient having severe hip pain and needing hip surgery for the past 2 months, worsening her depression.  Patient previously went to a pain management clinic and was prescribed medications like oxycodone, Dilaudid, and fentanyl patches in which patient's husband notes she has a stock of at home.  While it is unclear if the intention of overdose with suicide, she is at risk of decompensation due to the amount fentanyl patches that she used prior to hospital admission.  Current outpatient psychotropic medications include Xanax, Adderall, Seroquel, and Effexor and historically she has had a fair response to these medications.  We will defer management of benzodiazepine withdrawal to primary team.  We will add agitation protocol and we agree with restarting her home medications for MDD.  We recommend patient to change outpatient psychiatry providers as patient is currently on medications that puts her at risk for dependence and tolerance given her substance use history.  On initial examination, patient was alert and oriented but agitated through most of the interview.  She contracts for safety and denies SI, HI, and AVH.  please see plan below for detailed recommendations.   Diagnoses:  Active Hospital problems: Active  Problems:   Anxiety and depression   Hypertension   ADD (attention deficit disorder)   Altered mental status   Abdominal tenderness   Rhabdomyolysis   Accidental fentanyl overdose (HCC)   Numbness of left foot     Plan   ## Psychiatric Medication Recommendations:  -- Start agitation protocol      -- Haldol 5 mg oral or IM q8h PRN for agitation      -- Ativan 2 mg oral or IM q8h PRN for agitation  -- Start Ativan for benzodiazepine withdrawal-will defer specific management to primary team -- Agree to continue home Effexor and Seroquel for depression  ## Medical Decision Making Capacity:  Capacity was not formally addressed during this encounter; however, the patient appeared to understand and participate in the discussion about their treatment plan.   ## Further Work-up:  -- per primary   -- most recent EKG on 8/28 had QtC of 437 -- Pertinent labwork reviewed earlier this admission includes: Ethanol < 10, lactic acid 3.5>2.2, acetaminophen and salicylate level WNL, ALT 35 AST 70, WBC 16.3> 15, CK 6548,  ## Disposition:  -- unclear at this time, needs medical stabilization first  ## Behavioral / Environmental:  --  Utilize compassion and acknowledge the patient's experiences while setting clear and realistic expectations for care. DELIRIUM RECS 1: Avoid antihistamines, anticholinergics, and minimize opiate use as these may worsen delirium. 2:Assess, prevent and manage pain as lack of treatment can result in delirium.  3: Recommend consult to PT/OT if not already done. Early mobility and exercise has been shown to decrease duration of delirium.  4:Provide appropriate  lighting and clear signage; a clock and calendar should be easily visible to the patient. 5:Monitor environmental factors. Reduce light and noise at night (close shades, turn off lights, turn off TV, ect). Correct any alterations in sleep cycle. 6: Reorient the patient to person, place, time and situation on each  encounter.  7: Correct sensory deficits if possible (replace eye glasses, hearing aids, ect). 8: Avoid restraints. Severely delirious patients benefit from constant observation by a sitter. 9: Do not leave patient unattended.      ## Safety and Observation Level:  - Based on my clinical evaluation, I estimate the patient to be at moderate risk of self harm in the current setting - At this time, we recommend a routine level of observation. This decision is based on my review of the chart including patient's history and current presentation, interview of the patient, mental status examination, and consideration of suicide risk including evaluating suicidal ideation, plan, intent, suicidal or self-harm behaviors, risk factors, and protective factors. This judgment is based on our ability to directly address suicide risk, implement suicide prevention strategies and develop a safety plan while the patient is in the clinical setting. Please contact our team if there is a concern that risk level has changed.  Suicide risk assessment  Patient has following modifiable risk factors for suicide: drug use which we are addressing by medical management.   Patient has following non-modifiable or demographic risk factors for suicide: separation or divorce and psychiatric hospitalization  Patient has the following protective factors against suicide: Access to outpatient mental health care   Thank you for this consult request. Recommendations have been communicated to the primary team.  We will follow at this time.   Lance Muss, MD  Psychiatric and Social History   Relevant Aspects of Hospital Course:  Admitted on 11/21/2022 for AMS.   Patient Report:  Patient seen this morning.  Patient was yelling throughout most of the interview stating "I will not behave good".  She is frustrated that people have asked her the same questions.  She is alert and oriented x 4.  When asked if she intended to take  multiple fentanyl patches, she states "yes, because of my hip pain". She denies SI, HI, and AVH. She reports living alone in an apartment. She agreed for the team to speak with her husband [name]. We discussed starting medication to help with agitation and mood.  Psychiatric ROS Unable to assess as patient terminated interview early due to agitation  Collateral information:  Contacted John (husband) at on 8/28.  He reports that the patient has been suffering from depression for the past few months due to needing a hip replacement and has been unable to find a provider for this.  He reports 2 days ago she was in a good mood because she found that she would be getting hip surgery.  Yesterday morning her friend visit did her at her home and her friend heard cries of help from inside the house so they called paramedics.  He notes patient recently refilled her medications and the last time he saw her pill bottles, it appeared to have appropriate amounts of medication.  He states that the patient previously was going to pain management clinic in which she was prescribed oxycodone, fentanyl patch, and Dilaudid in which he believes she still has a stock of.  He denies a history of suicide attempts and she previously was hospitalized 4 years ago in an inpatient psychiatric facility.  Psychiatric  History:  Information collected from collateral  Prev Dx/Sx: MDD Current Psych Provider: Dr. [name] Current Meds: Xanax, Adderall, Seroquel, Effexor Previous Meds: Abilify, trazodone, Ambien Prior Psych Hospitalization: 4 years ago  Prior Self Harm: Denies  Family Psych History: PTSD  Social History:  Occupational Hx: Counselor Living Situation: Lives alone in apartment Access to weapons: Will ask collateral at later date   Alcohol use: per collateral, unsure Drug use: Denies, positive for THC, benzo (prescribed Xanax), and amphetamine (prescribed Adderall)   Exam Findings   Psychiatric Specialty  Exam:  Presentation  General Appearance: Disheveled  Eye Contact:Fair  Speech:Clear and Coherent  Speech Volume:Increased  Handedness:No data recorded  Mood and Affect  Mood:Angry; Labile  Affect:Labile; Congruent; Inappropriate   Thought Process  Thought Processes:Linear  Descriptions of Associations:Intact  Orientation:Full (Time, Place and Person)  Thought Content:Logical  History of Schizophrenia/Schizoaffective disorder:No data recorded Duration of Psychotic Symptoms:No data recorded Hallucinations:Hallucinations: None  Ideas of Reference:None  Suicidal Thoughts:Suicidal Thoughts: No  Homicidal Thoughts:Homicidal Thoughts: No   Sensorium  Memory:Immediate Good; Recent Good; Remote Fair  Judgment:Poor  Insight:Poor   Executive Functions  Concentration:Poor  Attention Span:Poor  Recall:Fair  Fund of Knowledge:Fair  Language:Fair   Psychomotor Activity  Psychomotor Activity:Psychomotor Activity: Normal   Assets  Assets:Housing; Resilience   Sleep  Sleep:Sleep: Fair    Physical Exam: Vital signs:  Temp:  [95.7 F (35.4 C)-98.6 F (37 C)] 98.1 F (36.7 C) (08/28 0808) Pulse Rate:  [86-108] 91 (08/28 0808) Resp:  [11-18] 17 (08/28 0808) BP: (126-193)/(79-121) 126/90 (08/28 0808) SpO2:  [94 %-100 %] 100 % (08/28 0808) Weight:  [73.3 kg] 73.3 kg (08/27 2128) Physical Exam Vitals reviewed.  HENT:     Head: Normocephalic and atraumatic.  Cardiovascular:     Rate and Rhythm: Normal rate.  Pulmonary:     Effort: Pulmonary effort is normal.  Neurological:     Mental Status: She is oriented to person, place, and time.     Blood pressure (!) 126/90, pulse 91, temperature 98.1 F (36.7 C), temperature source Oral, resp. rate 17, height 5\' 4"  (1.626 m), weight 73.3 kg, SpO2 100%. Body mass index is 27.74 kg/m.   Other History   These have been pulled in through the EMR, reviewed, and updated if appropriate.   Family History:   The patient's family history includes Alcohol abuse in her brother and father; Arthritis in her mother; Breast cancer (age of onset: 55) in her maternal aunt; Breast cancer (age of onset: 7) in her mother; COPD in her father; Cancer in her brother and mother; Cancer (age of onset: 21) in her cousin; Colon cancer (age of onset: 54) in her paternal aunt; HIV in her brother; Healthy in her brother, daughter, and son; Heart disease in her father and mother; Hodgkin's lymphoma in her brother; Hypertension in her father; Mental illness in her brother, father, and mother; Stomach cancer (age of onset: 29) in her paternal uncle.  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   Post-nasal drip    hx of   Pre-diabetes    patient states she is not Pre-Diabetic at appt. 12/16/2018   PTSD (post-traumatic stress disorder)    RA (rheumatoid arthritis) (HCC)    autoimmune arthritis   RA (rheumatoid arthritis) (HCC)    Spondylitis (HCC)    Suicidal intent     Surgical History: Past Surgical  History:  Procedure Laterality Date   ANTERIOR CERVICAL DECOMP/DISCECTOMY FUSION N/A 03/15/2015   Procedure: Cervical five-six, Cerival six-seven, Anterior Cervical Discectomy and Fusion, Allograft and Plate;  Surgeon: Eldred Manges, MD;  Location: [facility] OR;  Service: Orthopedics;  Laterality: N/A;   ANTERIOR LAT LUMBAR FUSION Left 06/11/2018   Procedure: Left Lumbar two-three Anterolateral decompression/fusion/lateral plate fixation;  Surgeon: Barnett Abu, MD;  Location: [facility] OR;  Service: Neurosurgery;  Laterality: Left;  Left Lumbar two-three Anterolateral decompression/fusion/lateral plate fixation   AUGMENTATION MAMMAPLASTY  2003   BACK SURGERY     BLADDER SUSPENSION  2009   BONE EXCISION Right 11/26/2018   Procedure: TRAPEZIUM EXCISION RIGHT THUMB;  Surgeon: Cindee Salt, MD;  Location: [facility];  Service: Orthopedics;  Laterality: Right;  AXILLARY BLOCK   BREAST IMPLANT REMOVAL Bilateral 10/2013   CARPOMETACARPEL SUSPENSION PLASTY Right 11/26/2018   Procedure: Carolynn Serve SUSPENSION;  Surgeon: Cindee Salt, MD;  Location: [facility];  Service: Orthopedics;  Laterality: Right;   CERVICAL WOUND DEBRIDEMENT N/A 07/07/2016   Procedure: IRRIGATION AND DEBRIDEMENT POSTERIOR NECK;  Surgeon: Eldred Manges, MD;  Location: [facility] OR;  Service: Orthopedics;  Laterality: N/A;   COLONOSCOPY     COMBINED ABDOMINOPLASTY AND LIPOSUCTION  2003   INCISION AND DRAINAGE ABSCESS Right 01/16/2014   Procedure: INCISION AND DRAINAGE AND OF RIGHT BREAST ABCESS;  Surgeon: Glenna Fellows, MD;  Location: [facility];  Service: General;  Laterality: Right;   JOINT REPLACEMENT     LUMBAR LAMINECTOMY/DECOMPRESSION MICRODISCECTOMY N/A 12/10/2015   Procedure: Right L3-4 Hemilaminectomy, Excision of herniated nucleus pulposus;  Surgeon: Eldred Manges, MD;  Location: [facility] OR;  Service: Orthopedics;  Laterality: N/A;   MASS EXCISION  11/03/2011   Procedure: MINOR EXCISION OF MASS;  Surgeon: Wyn Forster., MD;   Location: [facility];  Service: Orthopedics;  Laterality: Left;  debride IP joint, cyst excision left index   MAXIMUM ACCESS (MAS) TRANSFORAMINAL LUMBAR INTERBODY FUSION (TLIF) 2 LEVEL Right 02/23/2017   POSTERIOR CERVICAL FUSION/FORAMINOTOMY N/A 06/21/2016   Procedure: POSTERIOR CERVICAL FUSION C5-C7 SPINOUS PROCESS WIRING;  Surgeon: Eldred Manges, MD;  Location: [facility];  Service: Orthopedics;  Laterality: N/A;   POSTERIOR LUMBAR FUSION  07/2009; 07/03/2014   "L4-5; L5-S1"   SHOULDER ARTHROSCOPY Right 2012   TENDON TRANSFER Right 11/26/2018   Procedure: TENDON TRANSFER;  Surgeon: Cindee Salt, MD;  Location:  SURGERY [facility];  Service: Orthopedics;  Laterality:  Right;   TONSILLECTOMY  1987   TOTAL SHOULDER ARTHROPLASTY  08/01/2011   Procedure: TOTAL SHOULDER ARTHROPLASTY;  Surgeon: Eulas Post, MD;  Location: [facility];  Service: Orthopedics;  Laterality: Right;  Right total shoulder arthroplasty   TUBAL LIGATION  1988    Medications:   Current Facility-Administered Medications:    acetaminophen (TYLENOL) tablet 650 mg, 650 mg, Oral, Q6H, Alicia Amel, MD, 650 mg at 11/22/22 1111   cloNIDine (CATAPRES) tablet 0.1 mg, 0.1 mg, Oral, TID, Alicia Amel, MD, 0.1 mg at 11/22/22 0502   enoxaparin (LOVENOX) injection 40 mg, 40 mg, Subcutaneous, Q24H, Peterson Ao, MD, 40 mg at 11/21/22 2042   folic acid (FOLVITE) tablet 1 mg, 1 mg, Oral, Daily, Shelby Mattocks, DO, 1 mg at 11/22/22 1114   lactated ringers infusion, , Intravenous, Continuous, Alicia Amel, MD, Last Rate: 125 mL/hr at 11/22/22 0321, New Bag at 11/22/22 0321   lidocaine (LIDODERM) 5 % 1 patch, 1 patch, Transdermal, Q24H, Alicia Amel, MD, 1 patch at 11/21/22 2033   lidocaine (XYLOCAINE) 2 % jelly 1 Application, 1 Application, Other, Once, Hindel, Leah, MD   lisinopril (ZESTRIL) tablet 15 mg, 15 mg, Oral, Daily, Darnelle Spangle B, MD, 15 mg at 11/22/22 1113   LORazepam (ATIVAN) tablet 1-4 mg, 1-4 mg,  Oral, Q1H PRN **OR** LORazepam (ATIVAN) injection 1-4 mg, 1-4 mg, Intravenous, Q1H PRN, Dahbura, Anton, DO   LORazepam (ATIVAN) tablet 2 mg, 2 mg, Oral, Once **OR** LORazepam (ATIVAN) injection 2 mg, 2 mg, Intramuscular, Once, Dahbura, Anton, DO   multivitamin with minerals tablet 1 tablet, 1 tablet, Oral, Daily, Shelby Mattocks, DO, 1 tablet at 11/22/22 1114   Oral care mouth rinse, 15 mL, Mouth Rinse, PRN, Westley Chandler, MD   QUEtiapine (SEROQUEL) tablet 25 mg, 25 mg, Oral, QHS, Alicia Amel, MD, 25 mg at 11/22/22 7829   thiamine (VITAMIN B1) tablet 100 mg, 100 mg, Oral, Daily, 100 mg at 11/22/22 1114 **OR** thiamine (VITAMIN B1) injection 100 mg, 100 mg, Intravenous, Daily, Shelby Mattocks, DO   venlafaxine XR (EFFEXOR-XR) 24 hr capsule 75 mg, 75 mg, Oral, Daily, Alicia Amel, MD, 75 mg at 11/22/22 1114  Allergies: Allergies  Allergen Reactions   Aspirin Swelling    Throat swells   Bee Venom Anaphylaxis   Ibuprofen Swelling    Throat swells   Ativan [Lorazepam] Other (See Comments)    Agitation/confusion    Ketamine Other (See Comments)    Hallucinations   Toradol [Ketorolac Tromethamine] Itching, Swelling and Rash   Tramadol Hcl Itching, Swelling and Rash    Tolerates Dilaudid 06/2016.  TDD.
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
## 2022-11-22 NOTE — Progress Notes (Addendum)
FMTS Brief Progress Note  S: Kara [name] is a 67 y.o. female with a history of self harm, hospitalization for suicidal intent, polysubstance use, depression, and PTSD presenting obtunded after being found with 3 Fentanyl patches on her person, admitted for likely Fentanyl overdose and now monitored for substance withdrawal.  Saw patient at bedside with Dr. Marisue Humble.  She reports that she is hurting "all over."  Inquires about getting her R hip replaced again.  Expresses willingness to take clonidine (which she refused earlier per RN) when explained that it can help with withdrawal symptoms.  Reassured by plan to receive phenobarbital later given its potential to relieve pain as well.  Emphasized concept of taking things one day at a time to patient and she appreciated this framing.  Discussed plan with RN.  O: BP 130/67 (BP Location: Left Arm)   Pulse 72   Temp 98.7 F (37.1 C) (Oral)   Resp 16   Ht 5\' 4"  (1.626 m) Comment: per pt  Wt 73.3 kg   SpO2 95%   BMI 27.74 kg/m   General: Elderly female moving about in bed, moderately agitated, restless. HEENT: Mildly dry mucous membranes. Cardiovascular: Borderline tachycardic pulses, regular rhythm. 2+ radial pulses. Respiratory: Normal WOB on room air. Extremities: Moving all extremities appropriately, still poor dorsiflexion of L foot. Neuro: RASS 1 to 2.  Alert to self, place, situation, month. Psych: Agitated, but converses and responds appropriately.  Tearful affect.  Emotionally labile.  Attempts to remove clothes and moving about in bed restlessly.  A/P: Altered Mental Status Continuing to experience pain in R hip and now reporting generalized body aches.  Suspect these are related to or at least exacerbated by withdrawal from substances (such as opiates, benzos). - Convinced patient to take her PM dose of clonidine and will continue with Q8 phenobarbital taper as well as quetiapine PM dose, staggered - Overall, patient is agitated but  redirectable and willing to take medications to help relieve symptoms - Continuing CIWA and COWS protocols, monitoring for excess sedation with Q4 neuro checks in setting of phenobarbital, clonidine, and quetiapine - PRN haloperidol available if agitation worsens  Abdominal Tenderness  Urinary Retention - Continuing Q8 bladder scans, but now voiding better, suspect retention was sequela of Fentanyl overdose - Will CTM, I&O cath if needed  - If condition changes, plan includes administering PRN haloperidol for agitation, avoiding lorazepam d/t recent overdose. - Orders reviewed. No AM labs.  Sharion Dove, Nevayah Faust, MD 11/22/2022, 8:00 PM PGY-1, [facility] Family Medicine Night Resident Please page [phone] with questions.

## 2022-11-22 NOTE — Plan of Care (Signed)
Pt fluctuates between sleeping and yelling/crying when awake. Asking for food but then falls asleep before able to eat. Night MD kept updated on status.   Unable to complete admission questions due to crying or sleeping.   Problem: Education: Goal: Knowledge of General Education information will improve Description: Including pain rating scale, medication(s)/side effects and non-pharmacologic comfort measures Outcome: Not Progressing   Problem: Health Behavior/Discharge Planning: Goal: Ability to manage health-related needs will improve Outcome: Not Progressing   Problem: Clinical Measurements: Goal: Ability to maintain clinical measurements within normal limits will improve Outcome: Progressing Goal: Will remain free from infection Outcome: Progressing Goal: Diagnostic test results will improve Outcome: Progressing Goal: Respiratory complications will improve Outcome: Progressing Goal: Cardiovascular complication will be avoided Outcome: Progressing   Problem: Activity: Goal: Risk for activity intolerance will decrease Outcome: Not Progressing   Problem: Nutrition: Goal: Adequate nutrition will be maintained Outcome: Progressing   Problem: Coping: Goal: Level of anxiety will decrease Outcome: Not Progressing   Problem: Elimination: Goal: Will not experience complications related to bowel motility Outcome: Progressing Goal: Will not experience complications related to urinary retention Outcome: Not Progressing   Problem: Pain Managment: Goal: General experience of comfort will improve Outcome: Not Progressing   Problem: Safety: Goal: Ability to remain free from injury will improve Outcome: Progressing   Problem: Skin Integrity: Goal: Risk for impaired skin integrity will decrease Outcome: Progressing

## 2022-11-22 NOTE — Progress Notes (Signed)
Patient yelling, trying to get out of bed, crying and continues to say "I am not able to move my left leg",  refused to go for CT. MD at bed side. Will continue to monitor

## 2022-11-22 NOTE — Progress Notes (Signed)
FMTS Interim Progress Note  S: Interim note, full note to follow. Seen patient x2 today. Attempted to discuss several topics regarding urinary retention, mental status. Patient continued to be quickly agitated, yelling obscenities.   Addendum: Alerted by nurse several times that patient is screaming, not allowing oral medications. Lost IV earlier in the morning as patient ripped it out. She was unable to give psych medications.   O: BP (!) 126/90 (BP Location: Left Arm)   Pulse 91   Temp 98.1 F (36.7 C) (Oral)   Resp 17   Ht 5\' 4"  (1.626 m) Comment: per pt  Wt 73.3 kg   SpO2 100%   BMI 27.74 kg/m   Psych: agitated, elevated mood, unable to answer orientation questions  A/P: Agitation Concerning for withdrawal in the history of polysubstance use: amphetamines, benzos, opioids.  - Continue CIWA protocol with ativan per CIWA, start with ativan 2mg >advised nurse - Consulted psych for addiitonal recommendations: attempted to treat with Geodon, Zyprexa, ativan - Consulted ICU for concern of acute withdrawal,Delirium tremens - One time only Haldol 5mg  IM, Ativan 2mg  IM. Of note, she does have "allergy" to ativan but this is listed as agitation/confusion which is the indication for why we are giving this. I have discussed with RN she may give.   Shelby Mattocks, DO 11/22/2022, 11:26 AM PGY-3, [facility] Family Medicine Service pager [phone]

## 2022-11-22 NOTE — TOC CM/SW Note (Signed)
Transition of Care [facility]) - Inpatient Brief Assessment   Patient Details  Name: Kara [name] MRN: [id number] Date of Birth: 13-Dec-1955  Transition [facility]) CM/SW Contact:    Kermit Balo, RN Phone Number: 11/22/2022, 1:06 PM   Clinical Narrative: Pt with agitation. Awaiting psyche consult.    Transition of Care Asessment: Insurance and Status: Insurance coverage has been reviewed Patient has primary care physician: Yes Home environment has been reviewed: home with spouse   Prior/Current Home Services: No current home services Social Determinants of Health Reivew: SDOH reviewed no interventions necessary Readmission risk has been reviewed: Yes Transition of care needs: transition of care needs identified, TOC will continue to follow

## 2022-11-22 NOTE — Progress Notes (Signed)
Patient resting peacefully but becomes anxious when called and asked any questions, complaining of pain in the hip but refusing tylenol and k-pad. she is stating "I need operation of her hip for the pain to go, nothing will help me" She is also refusing to take her scheduled clonidine PO. She used the [facility], voided and had a big BM. Will continue to monitor

## 2022-11-22 NOTE — Assessment & Plan Note (Signed)
Patient on 30 mg adderall BID. Cause of positive amphetamines on UDS.  - Hold in the setting of somnolence and possible drug overdose.

## 2022-11-22 NOTE — Progress Notes (Addendum)
FMTS Brief Progress Note  Subjective Notified by RN that patient was experiencing new sensation of LLE.  Patient states she feels a "film" on her L foot and feels that "something is wrong."  Continues to complain of R hip pain.  Becomes tearful and then falls asleep once conversation and exam are ended.  Objective: BP 128/88 (BP Location: Left Arm)   Pulse 93   Temp 98.6 F (37 C) (Axillary)   Resp 16   Ht 5\' 4"  (1.626 m) Comment: per pt  Wt 73.3 kg   SpO2 96%   BMI 27.74 kg/m   Neurological Examination: RASS -1 to -2. MS: Drowsy, still able to respond to questions. Keeps eyes closed, speech fluent but somewhat slurred, normal comprehension.  Attention and concentration were decreased. Cranial Nerves: Pupils were equal, but constricted (1-2 mm); EOM and nystagmus impossible to evaluate due to drowsiness; intact facial sensation, face symmetric with full strength of facial muscles; palate elevation is symmetric, tongue protrusion is symmetric with full movement to both sides.  Sternocleidomastoid with normal strength. Strength: Normal strength in BUE, difficult to evaluate in LE due to poor compliance with exam.  Cannot dorsiflex L foot, plantar flexion normal.  R foot dorsiflexion and plantar flexion normal. Sensation: Intact to light touch bilaterally.  A/P Presentation is concerning given that patient continues to be altered from baseline with notable drowsiness.  Spoke with Poison Control and patient may be more recovered from Fentanyl overdose as she is >12 hours from initial EMS call and >7 hours from last Narcan dose.  Difficult to conduct full neuro exam due to drowsiness.  Notably, her constricted pupils are likely d/t clonidine administration. It is possible that the patient has had a stroke, but given ~18 hours since initial presentation, would not be a candidate for acute intervention.  Additionally, do not believe patient will remain still for MRI and ativan is contraindicated  given recent overdose and persistent drowsiness.  Will obtain interval repeat CT head.  Mental status may also be altered due to complex combination of substances and withdrawal from said substances (UDS positive for most substances). -CT head w/o contrast -CIWAs initiated in addition to COWS -Will call ex-husband in AM for collateral about baseline strength of LLE  Novali Vollman, MD 11/22/2022, 6:04 AM PGY-1, [facility] Night Resident  Please page [phone] with questions.

## 2022-11-23 DIAGNOSIS — G8929 Other chronic pain: Secondary | ICD-10-CM | POA: Diagnosis present

## 2022-11-23 DIAGNOSIS — T40411D Poisoning by fentanyl or fentanyl analogs, accidental (unintentional), subsequent encounter: Secondary | ICD-10-CM | POA: Diagnosis not present

## 2022-11-23 DIAGNOSIS — G9340 Encephalopathy, unspecified: Principal | ICD-10-CM

## 2022-11-23 DIAGNOSIS — F329 Major depressive disorder, single episode, unspecified: Secondary | ICD-10-CM | POA: Insufficient documentation

## 2022-11-23 DIAGNOSIS — R29898 Other symptoms and signs involving the musculoskeletal system: Secondary | ICD-10-CM

## 2022-11-23 DIAGNOSIS — F411 Generalized anxiety disorder: Secondary | ICD-10-CM | POA: Insufficient documentation

## 2022-11-23 DIAGNOSIS — F1193 Opioid use, unspecified with withdrawal: Secondary | ICD-10-CM | POA: Insufficient documentation

## 2022-11-23 MED ORDER — ACETAMINOPHEN 500 MG PO TABS
1000.0000 mg | ORAL_TABLET | Freq: Four times a day (QID) | ORAL | Status: DC
  Filled 2022-11-23: qty 2

## 2022-11-23 MED ORDER — CLONIDINE HCL 0.1 MG PO TABS
0.2000 mg | ORAL_TABLET | Freq: Three times a day (TID) | ORAL | Status: DC
  Filled 2022-11-23: qty 2

## 2022-11-23 NOTE — Assessment & Plan Note (Addendum)
Likely secondary to urinary retention in the setting of multiple substances and altered mental status. UA with hgb as expected with CK elevation. UOP this AM with post void residual 138. - Continue to monitor

## 2022-11-23 NOTE — Assessment & Plan Note (Signed)
Elevated pressures to 190s/120s overnight. Improved this morning. -home Lisinopril 15mg  daily -clonidine as above

## 2022-11-23 NOTE — TOC Transition Note (Signed)
Transition of Care [facility] - CM/SW Discharge Note   Patient Details  Name: Kara [name] MRN: [id number] Date of Birth: 05-12-1955  Transition of Care [facility] CM/SW Contact:  Kermit Balo, RN Phone Number: 11/23/2022, 3:14 PM   Clinical Narrative:    Pt is leaving AMA.   Final next level of care: Against Medical Advice     Patient Goals and CMS Choice      Discharge Placement                         Discharge Plan and Services Additional resources added to the After Visit Summary for                                       Social Determinants of Health (SDOH) Interventions SDOH Screenings   Food Insecurity: Patient Declined (10/02/2022)   Received from [facility]  Transportation Needs: Patient Declined (10/02/2022)   Received from [facility]  Utilities: Patient Declined (10/02/2022)   Received from [facility]  Alcohol Screen: Low Risk  (12/17/2018)  Financial Resource Strain: Patient Declined (10/02/2022)   Received from [facility]  Physical Activity: Unknown (10/02/2022)   Received from [facility]  Social Connections: Unknown (07/25/2021)   Received from [facility]  Stress: Patient Declined (10/02/2022)   Received from [facility]  Tobacco Use: Medium Risk (11/21/2022)     Readmission Risk Interventions     No data to display

## 2022-11-23 NOTE — Progress Notes (Signed)
Daily Progress Note Intern Pager: [phone]  Patient name: Kara [name] Medical record number: [id number] Date of birth: 11/04/1955 Age: 67 y.o. Gender: female  Primary Care Provider: Irena Reichmann, DO Consultants: psych, CCM Code Status: Full code  Pt Overview and Major Events to Date:  8/27: admitted  Assessment and Plan: [name] is a 67 y.o. female presenting with altered mental status/somnolence likely secondary to drug overdose. Found down with multiple opioid patches and UDS was positive for multiple substances. Responsive to Narcan. Agitation this morning likely secondary to benzodiazepine withdrawal vs primary psychiatric disease   Pertinent PMH/PSH includes chronic pain syndrome, anxiety/depression, SI/SA, ADHD, HTN, fibromyalgia, RA  Assessment & Plan Altered mental status Initial somnolence/drowsiness after drug overdose with fentanyl. Then highly agitated likely 2/2 polysubstance withdrawal vs primary psychiatric disease. - Haldol 5mg  q8 PRN for agitation per psych - Phenobarb taper per CCM - clonidine 0.1mg  TID to help with opiate withdrawal and BP - Continue cardiopulmonary monitoring  - continue CIWA, COWS q 4 - Neuro checks q 4  - Fall precautions, seizure precautions  - Avoid medications that could alter mental status  - Monitor for signs of infection and restart ABX if necessary  - F/u Bcx - Ng x 24 hours  Chronic right hip pain Severe degenerative joint disease. Pt in process of scheduling hip replacement surgery prior to admission. Pain control difficult as patient is allergic to NSAIDs and presented after opioid overdose -scheduled 1000mg  tylenol q6hr -lidocaine patch daily -heating pad Abdominal tenderness Likely secondary to urinary retention in the setting of multiple substances and altered mental status. UA with hgb as expected with CK elevation. UOP this AM with post void residual 138. - Continue to monitor  Rhabdomyolysis Most likely  due to immobility for prolonged time. Could also be due to amphetamines. No renal toxicity at this time. S/p 3L bolus. CK trending down. - repeat CK with other labs tomorrow to avoid additional sticks given patient's tendency for agitation Anxiety and depression Pt has not been taking xanax for several weeks. Holding home trazodone to avoid excess medication interactions as pt remains altered. Restarted home seroquel and venlafaxine due to acute anxiety overnight. - continue home seroquel 25mg  and venlafaxine 75mg  ADD (attention deficit disorder) Patient on 30 mg adderall BID. Cause of positive amphetamines on UDS.  - Hold in the setting of somnolence and possible drug overdose.  Hypertension Elevated pressures to 190s/120s overnight. Improved this morning. -home Lisinopril 15mg  daily -clonidine as above Numbness of left foot Unclear whether acute or chronic. Ex-husband states this is a chronic problem. Pt adamant overnight that this is a new issue. CT head/neck on admission negative for acute changes. TSH low. -f/u thiamine, B12, folate, free T4  -PT eval and treat -continue to monitor  FEN/GI: dysphagia 3 PPx: lovenox Dispo: pending clinical improvement  Subjective:  Patient sleeping in room upon visit.  Awoke to voice, states she is having pain in her right leg.  Would like lidocaine patch.  Denies pain elsewhere, does not mention LLE numbness. States she has been urinating without issue.  Very calm and cooperative throughout visit. Returns to sleeping after interaction.  Objective: Temp:  [98.1 F (36.7 C)-98.7 F (37.1 C)] 98.1 F (36.7 C) (08/29 0452) Pulse Rate:  [68-91] 68 (08/29 0452) Resp:  [16-18] 16 (08/29 0452) BP: (123-143)/(67-90) 133/81 (08/29 0452) SpO2:  [94 %-100 %] 99 % (08/29 0452) Physical Exam: General: Middle-aged female sleeping comfortably in bed in no acute  distress, answering questions appropriately Cardiovascular: Regular rate and rhythm, normal S1/S2,  no murmurs, rubs, gallops Respiratory: Clear to auscultation bilaterally, normal work of breathing Abdomen: Bowel sounds present, soft, nontender, nondistended Extremities: No swelling to bilateral lower extremities Psych: Calm, appropriate Laboratory: Most recent CBC Lab Results  Component Value Date   WBC 15.0 (H) 11/22/2022   HGB 11.6 (L) 11/22/2022   HCT 37.9 11/22/2022   MCV 81.3 11/22/2022   PLT 416 (H) 11/22/2022   Most recent BMP    Latest Ref Rng & Units 11/22/2022    6:39 AM  BMP  Glucose 70 - 99 mg/dL 454   BUN 8 - 23 mg/dL 11   Creatinine 0.98 - 1.00 mg/dL 1.19   Sodium 147 - 829 mmol/L 139   Potassium 3.5 - 5.1 mmol/L 3.8   Chloride 98 - 111 mmol/L 103   CO2 22 - 32 mmol/L 26   Calcium 8.9 - 10.3 mg/dL 8.8     Other pertinent labs HIV: negative  Free T4: pending Folate: pending B12: pending  Imaging/Diagnostic Tests: none Lorayne Bender, MD 11/23/2022, 7:14 AM  PGY-1, [facility] FPTS Intern pager: [phone], text pages welcome Secure chat group [facility] Teaching Service

## 2022-11-23 NOTE — Plan of Care (Signed)
Pt sleeping well after phenobarbital.  More oriented than last night but still confused on some situation. Irritable/agitated especially when RN needing to do vitals/assessment but more redirectable throughout the night.   By 0500, pt still had not voided and still denied urge. Agreed to at least try per RN request but does not want to get to [facility] due to left leg feeling numb (not just foot). Pt will full range of motion in left leg and can feel RN touch leg but says still "feels numb". Denies leg numbness in past. MD notified.  Assisted to bedpan and able to void with PVR = 138. Pt more cooperative and thankful of care this morning.     Problem: Education: Goal: Knowledge of General Education information will improve Description: Including pain rating scale, medication(s)/side effects and non-pharmacologic comfort measures Outcome: Progressing   Problem: Health Behavior/Discharge Planning: Goal: Ability to manage health-related needs will improve Outcome: Progressing   Problem: Clinical Measurements: Goal: Ability to maintain clinical measurements within normal limits will improve Outcome: Progressing Goal: Will remain free from infection Outcome: Progressing Goal: Diagnostic test results will improve Outcome: Progressing Goal: Respiratory complications will improve Outcome: Progressing Goal: Cardiovascular complication will be avoided Outcome: Progressing   Problem: Elimination: Goal: Will not experience complications related to bowel motility Outcome: Progressing Goal: Will not experience complications related to urinary retention Outcome: Progressing   Problem: Pain Managment: Goal: General experience of comfort will improve Outcome: Progressing   Problem: Skin Integrity: Goal: Risk for impaired skin integrity will decrease Outcome: Progressing

## 2022-11-23 NOTE — Consult Note (Signed)
Kara [name] Psychiatry Consult Evaluation  Service Date: November 23, 2022 LOS:  LOS: 2 days    Primary Psychiatric Diagnoses  Opioid use disorder MDD  Assessment  Kara [name] is a 67 y.o. female admitted medically on 11/21/2022  2:06 PM for AMS. She carries the psychiatric diagnoses of ADHD, anxiety, and opioid use and has a past medical history of chronic pain syndrome, fibromyalgia, RA.Psychiatry was consulted for "Pt found down with 3 fentanyl patches...assistance managing primary psychiatric disease."  by Dr. Manson Passey on 8/28.    Her current presentation of agitation in the context of overdosing on fentanyl patches puts her at risk of opioid withdrawal.  This is in the context of the patient having severe hip pain and needing hip surgery for the past 2 months, worsening her depression.  Patient previously went to a pain management clinic and was prescribed medications like oxycodone, Dilaudid, and fentanyl patches in which patient's husband notes she has a stock of at home.  While it is unclear if the intention of overdose with suicide, she is at risk of decompensation due to the amount fentanyl patches that she used prior to hospital admission.  Current outpatient psychotropic medications include Xanax, Adderall, Seroquel, and Effexor and historically she has had a fair response to these medications.  We will defer management of benzodiazepine withdrawal to primary team.  We will add agitation protocol and we agree with restarting her home medications for MDD.    We called patient's outpatient psychiatrist clinic and informed them that the patient is in the hospital and that there is concern of her stockpiling her pain medications including oxycodone, fentanyl patches, and Dilaudid received from a pain management clinic that she used to go to.  On initial examination, patient was alert and oriented but agitated through most of the interview. Today, patient continues to be irritable but no longer  agitated and is answering questions appropriately. Patient denies needing further psychiatric care nor does she feel she needs inpatient psychiatric treatment. At this time, she does not meet criteria for inpatient psychiatry as she is not in imminent harm to herself or others. She contracts for safety and denies SI, HI, and AVH.  After medical stabilization, patient can return home.  Please see plan below for detailed recommendations.     Diagnoses:  Active Hospital problems: Principal Problem:   Accidental fentanyl overdose (HCC) Active Problems:   Hypertension   ADD (attention deficit disorder)   Altered mental status   Abdominal tenderness   Rhabdomyolysis   Numbness of left foot   Chronic right hip pain   MDD (major depressive disorder)   Anxiety state   Opioid Use Disorder     Plan   ## Psychiatric Medication Recommendations:  -- Continue agitation protocol      -- Haldol 5 mg oral or IM q8h PRN for agitation -- Currently on a phenobarbital taper -- Agree to continue home Effexor and Seroquel for depression  ## Medical Decision Making Capacity:  Capacity was not formally addressed during this encounter; however, the patient appeared to understand and participate in the discussion about their treatment plan.   ## Further Work-up:  -- per primary   -- most recent EKG on 8/28 had QtC of 437 -- Pertinent labwork reviewed earlier this admission includes: Ethanol < 10, lactic acid 3.5>2.2, acetaminophen and salicylate level WNL, ALT 35 AST 70, WBC 16.3> 15, CK 6548,  ## Disposition:  -- unclear at this time, needs medical stabilization first  ##  Behavioral / Environmental:  --  Utilize compassion and acknowledge the patient's experiences while setting clear and realistic expectations for care. DELIRIUM RECS 1: Avoid antihistamines, anticholinergics, and minimize opiate use as these may worsen delirium. 2:Assess, prevent and manage pain as lack of treatment can result in  delirium.  3: Recommend consult to PT/OT if not already done. Early mobility and exercise has been shown to decrease duration of delirium.  4:Provide appropriate lighting and clear signage; a clock and calendar should be easily visible to the patient. 5:Monitor environmental factors. Reduce light and noise at night (close shades, turn off lights, turn off TV, ect). Correct any alterations in sleep cycle. 6: Reorient the patient to person, place, time and situation on each encounter.  7: Correct sensory deficits if possible (replace eye glasses, hearing aids, ect). 8: Avoid restraints. Severely delirious patients benefit from constant observation by a sitter. 9: Do not leave patient unattended.      ## Safety and Observation Level:  - Based on my clinical evaluation, I estimate the patient to be at low risk of self harm in the current setting - At this time, we recommend a routine level of observation. This decision is based on my review of the chart including patient's history and current presentation, interview of the patient, mental status examination, and consideration of suicide risk including evaluating suicidal ideation, plan, intent, suicidal or self-harm behaviors, risk factors, and protective factors. This judgment is based on our ability to directly address suicide risk, implement suicide prevention strategies and develop a safety plan while the patient is in the clinical setting. Please contact our team if there is a concern that risk level has changed.  Suicide risk assessment  Patient has following modifiable risk factors for suicide: drug use which we are addressing by medical management.   Patient has following non-modifiable or demographic risk factors for suicide: separation or divorce and psychiatric hospitalization  Patient has the following protective factors against suicide: Access to outpatient mental health care   Thank you for this consult request. Recommendations have  been communicated to the primary team.  We will sign off at this time.   Lance Muss, MD  Psychiatric and Social History   Relevant Aspects of Hospital Course:  Admitted on 11/21/2022 for AMS.   Patient Report:  Per chart review, primary team saw patient yesterday evening and states she was hurting all over.  She was agreeable to take her clonidine and was more redirectable during conversation.  Patient was sleeping this morning.  Reevaluated in the afternoon with Dr. Rebecca Eaton.  Patient continues to report pain in her hip.  We discussed that we informed her psychiatrist that she is in the hospital.  She states that she has a good relationship with Dr. [name].  She denies SI and does not feel she needs to go to a psychiatric facility nor does she need psychiatric care at this time.   Psychiatric ROS Worsening depression and anxiety for the past two months.  Collateral information:  Contacted John (husband) at on 8/28.  He reports him and the patient are separated but he routinely helps her in her home.  Patient has been minimally mobile due to her hip pain.  He reports that the patient has been suffering from depression for the past few months due to needing a hip replacement and has been unable to find a provider for this.  He reports 2 days ago she was in a good mood because she found  that she would be getting hip surgery.  Yesterday morning her friend visit did her at her home and her friend heard cries of help from inside the house so they called paramedics.  He notes patient recently refilled her medications and the last time he saw her pill bottles, it appeared to have appropriate amounts of medication.  He states that the patient previously was going to pain management clinic in which she was prescribed oxycodone, fentanyl patch, and Dilaudid in which he believes she still has a stock of.  He denies a history of suicide attempts and she previously was hospitalized 4 years ago in an inpatient  psychiatric facility.  Psychiatric History:  Information collected from collateral  Prev Dx/Sx: MDD Current Psych Provider: Dr. [name] Current Meds: Xanax, Adderall, Seroquel, Effexor Previous Meds: Abilify, trazodone, Ambien Prior Psych Hospitalization: 4 years ago  Prior Self Harm: Denies  Family Psych History: PTSD  Social History:  Occupational Hx: Counselor Living Situation: Lives alone in apartment Access to weapons: Will ask collateral at later date   Alcohol use: per collateral, unsure Drug use: Denies, positive for THC, benzo (prescribed Xanax), and amphetamine (prescribed Adderall)   Exam Findings   Psychiatric Specialty Exam:  Presentation  General Appearance: Disheveled  Eye Contact:Fair  Speech:Clear and Coherent  Speech Volume:Normal  Handedness:Right   Mood and Affect  Mood:Irritable  Affect:Congruent   Thought Process  Thought Processes:Coherent  Descriptions of Associations:Intact  Orientation:Full (Time, Place and Person)  Thought Content:Logical  History of Schizophrenia/Schizoaffective disorder:No data recorded Duration of Psychotic Symptoms:No data recorded Hallucinations:Hallucinations: None  Ideas of Reference:None  Suicidal Thoughts:Suicidal Thoughts: No  Homicidal Thoughts:Homicidal Thoughts: No   Sensorium  Memory:Immediate Good; Recent Good; Remote Good  Judgment:Intact  Insight:Fair   Executive Functions  Concentration:Fair  Attention Span:Fair  Recall:Fair  Fund of Knowledge:Fair  Language:Fair   Psychomotor Activity  Psychomotor Activity:Psychomotor Activity: Normal   Assets  Assets:Housing; Resilience; Social Support; Communication Skills   Sleep  Sleep:Sleep: Fair    Physical Exam: Vital signs:  Temp:  [97.8 F (36.6 C)-98.7 F (37.1 C)] 97.8 F (36.6 C) (08/29 1149) Pulse Rate:  [66-72] 70 (08/29 1149) Resp:  [16-18] 18 (08/29 1149) BP: (117-148)/(61-81) 117/67 (08/29 1149) SpO2:   [94 %-99 %] 98 % (08/29 1149) Physical Exam Vitals reviewed.  HENT:     Head: Normocephalic and atraumatic.  Cardiovascular:     Rate and Rhythm: Normal rate.  Pulmonary:     Effort: Pulmonary effort is normal.  Neurological:     Mental Status: She is oriented to person, place, and time.     Blood pressure 117/67, pulse 70, temperature 97.8 F (36.6 C), temperature source Oral, resp. rate 18, height 5\' 4"  (1.626 m), weight 73.3 kg, SpO2 98%. Body mass index is 27.74 kg/m.   Other History   These have been pulled in through the EMR, reviewed, and updated if appropriate.   Family History:  The patient's family history includes Alcohol abuse in her brother and father; Arthritis in her mother; Breast cancer (age of onset: 52) in her maternal aunt; Breast cancer (age of onset: 27) in her mother; COPD in her father; Cancer in her brother and mother; Cancer (age of onset: 19) in her cousin; Colon cancer (age of onset: 37) in her paternal aunt; HIV in her brother; Healthy in her brother, daughter, and son; Heart disease in her father and mother; Hodgkin's lymphoma in her brother; Hypertension in her father; Mental illness in her brother, father, and  mother; Stomach cancer (age of onset: 20) in her paternal uncle.  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)    Chronic lower back pain    Chronic pain    went to [facility]; stopped in 2016 " (02/23/2017)   Cold sore    Complication of anesthesia    Ketamine makes her hallucinate   Confusion caused by a drug    methotrexate and autoimmune disease    Degenerative disc disease, lumbar    Depression    takes meds daily    Family history of malignant neoplasm of breast    Fibromyalgia    Fibromyalgia    H/O degenerative disc disease    History of kidney stones    Hypertension    Osteoporosis    Osteoporosis    Other specified rheumatoid arthritis, 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   Post-nasal drip    hx of   Pre-diabetes    patient states she is not Pre-Diabetic at appt. 12/16/2018   PTSD (post-traumatic stress disorder)    RA (rheumatoid arthritis) (HCC)    autoimmune arthritis   RA (rheumatoid arthritis) (HCC)    Spondylitis (HCC)    Suicidal intent     Surgical History: Past Surgical History:  Procedure Laterality Date   ANTERIOR CERVICAL DECOMP/DISCECTOMY FUSION N/A 03/15/2015   Procedure: Cervical five-six, Cerival six-seven, Anterior Cervical Discectomy and Fusion, Allograft and Plate;  Surgeon: Eldred Manges, MD;  Location: [facility];  Service: Orthopedics;  Laterality: N/A;   ANTERIOR LAT LUMBAR FUSION Left 06/11/2018   Procedure: Left Lumbar two-three Anterolateral decompression/fusion/lateral plate fixation;  Surgeon: Barnett Abu, MD;  Location: [facility] OR;  Service: Neurosurgery;  Laterality: Left;  Left Lumbar two-three Anterolateral decompression/fusion/lateral plate fixation   AUGMENTATION MAMMAPLASTY  2003   BACK SURGERY     BLADDER SUSPENSION  2009   BONE EXCISION Right 11/26/2018   Procedure: TRAPEZIUM EXCISION RIGHT THUMB;  Surgeon: Cindee Salt, MD;  Location: [facility];  Service: Orthopedics;  Laterality: Right;  AXILLARY BLOCK   BREAST IMPLANT REMOVAL Bilateral 10/2013   CARPOMETACARPEL SUSPENSION PLASTY Right 11/26/2018   Procedure: Carolynn Serve SUSPENSION;  Surgeon: Cindee Salt, MD;  Location: [facility];  Service: Orthopedics;  Laterality: Right;   CERVICAL WOUND DEBRIDEMENT N/A 07/07/2016   Procedure: IRRIGATION AND DEBRIDEMENT POSTERIOR NECK;  Surgeon: Eldred Manges, MD;  Location: [facility];  Service: Orthopedics;  Laterality: N/A;   COLONOSCOPY      COMBINED ABDOMINOPLASTY AND LIPOSUCTION  2003   INCISION AND DRAINAGE ABSCESS Right 01/16/2014   Procedure: INCISION AND DRAINAGE AND OF RIGHT BREAST ABCESS;  Surgeon: Glenna Fellows, MD;  Location: [facility];  Service: General;  Laterality: Right;   JOINT REPLACEMENT     LUMBAR LAMINECTOMY/DECOMPRESSION MICRODISCECTOMY N/A 12/10/2015   Procedure: Right L3-4 Hemilaminectomy, Excision of herniated nucleus pulposus;  Surgeon: Eldred Manges, MD;  Location[facility];  Service: Orthopedics;  Laterality: N/A;   MASS EXCISION  11/03/2011   Procedure: MINOR EXCISION OF MASS;  Surgeon: Wyn Forster., MD;  Location: [facility];  Service: Orthopedics;  Laterality: Left;  debride IP joint, cyst excision left index   MAXIMUM ACCESS (MAS) TRANSFORAMINAL LUMBAR INTERBODY FUSION (TLIF) 2 LEVEL Right 02/23/2017   POSTERIOR CERVICAL FUSION/FORAMINOTOMY N/A 06/21/2016   Procedure: POSTERIOR CERVICAL FUSION C5-C7 SPINOUS PROCESS WIRING;  Surgeon: Eldred Manges, MD;  Location: [facility];  Service: Orthopedics;  Laterality: N/A;   POSTERIOR LUMBAR FUSION  07/2009; 07/03/2014   "L4-5; L5-S1"   SHOULDER ARTHROSCOPY Right 2012   TENDON TRANSFER Right 11/26/2018   Procedure: TENDON TRANSFER;  Surgeon: Cindee Salt, MD;  Location: [facility];  Service: Orthopedics;  Laterality: Right;   TONSILLECTOMY  1987   TOTAL SHOULDER ARTHROPLASTY  08/01/2011   Procedure: TOTAL SHOULDER ARTHROPLASTY;  Surgeon: Eulas Post, MD;  Location: [facility];  Service: Orthopedics;  Laterality: Right;  Right total shoulder arthroplasty   TUBAL LIGATION  1988    Medications:   Current Facility-Administered Medications:    acetaminophen (TYLENOL) tablet 1,000 mg, 1,000 mg, Oral, Q6H, Hindel, Leah, MD   cloNIDine (CATAPRES) tablet 0.2 mg, 0.2 mg, Oral, TID, Lockie Mola, MD   enoxaparin (LOVENOX) injection 40 mg, 40 mg, Subcutaneous, Q24H, Peterson Ao, MD, 40 mg at 11/22/22 2001   folic acid (FOLVITE) tablet 1 mg,  1 mg, Oral, Daily, Shelby Mattocks, DO, 1 mg at 11/23/22 0801   haloperidol (HALDOL) tablet 5 mg, 5 mg, Oral, Q8H PRN, 5 mg at 11/23/22 0755 **OR** haloperidol lactate (HALDOL) injection 5 mg, 5 mg, Intramuscular, Q8H PRN, Oda Cogan, Corneilus Heggie B, MD   lidocaine (LIDODERM) 5 % 1 patch, 1 patch, Transdermal, Q24H, Alicia Amel, MD, 1 patch at 11/21/22 2033   lidocaine (XYLOCAINE) 2 % jelly 1 Application, 1 Application, Other, Once, Hindel, Leah, MD   lisinopril (ZESTRIL) tablet 15 mg, 15 mg, Oral, Daily, Alicia Amel, MD, 15 mg at 11/23/22 0801   multivitamin with minerals tablet 1 tablet, 1 tablet, Oral, Daily, Shelby Mattocks, DO, 1 tablet at 11/23/22 0801   Oral care mouth rinse, 15 mL, Mouth Rinse, PRN, Westley Chandler, MD   PHENobarbital (LUMINAL) tablet 97.2 mg, 97.2 mg, Oral, Q8H, 97.2 mg at 11/23/22 1218 **FOLLOWED BY** [START ON 11/24/2022] PHENobarbital (LUMINAL) tablet 64.8 mg, 64.8 mg, Oral, Q8H **FOLLOWED BY** [START ON 11/26/2022] PHENobarbital (LUMINAL) tablet 32.4 mg, 32.4 mg, Oral, Q8H, [place], Jennifer D, RPH   QUEtiapine (SEROQUEL) tablet 25 mg, 25 mg, Oral, QHS, Darnelle Spangle B, MD, 25 mg at 11/23/22 0021   thiamine (VITAMIN B1) tablet 100 mg, 100 mg, Oral, Daily, 100 mg at 11/23/22 0801 **OR** thiamine (VITAMIN B1) injection 100 mg, 100 mg, Intravenous, Daily, Shelby Mattocks, DO   venlafaxine XR (EFFEXOR-XR) 24 hr capsule 75 mg, 75 mg, Oral, Daily, Alicia Amel, MD, 75 mg at 11/23/22 0801  Allergies: Allergies  Allergen Reactions   Aspirin Swelling    Throat swells   Bee Venom Anaphylaxis   Ibuprofen Swelling    Throat swells   Ativan [Lorazepam] Other (See Comments)    Agitation/confusion    Ketamine Other (See Comments)    Hallucinations   Toradol [Ketorolac Tromethamine] Itching, Swelling and Rash   Tramadol Hcl Itching, Swelling and Rash    Tolerates Dilaudid 06/2016.  TDD.

## 2022-11-23 NOTE — Assessment & Plan Note (Signed)
Severe degenerative joint disease. Pt in process of scheduling hip replacement surgery prior to admission. Pain control difficult as patient is allergic to NSAIDs and presented after opioid overdose -scheduled 1000mg  tylenol q6hr -lidocaine patch daily -heating pad

## 2022-11-23 NOTE — Assessment & Plan Note (Signed)
Pt has not been taking xanax for several weeks. Holding home trazodone to avoid excess medication interactions as pt remains altered. Restarted home seroquel and venlafaxine due to acute anxiety overnight. - continue home seroquel 25mg  and venlafaxine 75mg 

## 2022-11-23 NOTE — Assessment & Plan Note (Addendum)
Most likely due to immobility for prolonged time. Could also be due to amphetamines. No renal toxicity at this time. S/p 3L bolus. CK trending down. - repeat CK with other labs tomorrow to avoid additional sticks given patient's tendency for agitation

## 2022-11-23 NOTE — Progress Notes (Signed)
NAME:  Kara [name], MRN:  [id number], DOB:  1955/07/28, LOS: 2 ADMISSION DATE:  11/21/2022, CONSULTATION DATE: 11/22/2022 REFERRING MD:  Ellwood Dense DO, CHIEF COMPLAINT:   Agitation  History of Present Illness:  Kara [name] is a 67 y.o. F pertinent PMH of chronic pain, PTSD, borderline personality disorder, alcohol use disorder, cannabis dependence who presented with AMS suspected to be related to excess fentanyl patch use [found to have multiple fentanyl patches on her], UDS positive for amphetamines, benzos, opiates and THC. She progressed into medication withdrawal with significant agitation and PCCM called for help with management.  Pertinent Medical History:   Past Medical History:  Diagnosis Date   ADD (attention deficit disorder)    on Adderal   ADD (attention deficit disorder)    Aggressive behavior of adult    Anemia    Anxiety    Cellulitis of breast 11/2013   RIGHT BREAST   Childhood asthma    as child   Chronic fatigue and immune dysfunction syndrome (HCC)    Chronic lower back pain    Chronic pain    went to [facility] for [facility] control; stopped in 2016 " (02/23/2017)   Cold sore    Complication of anesthesia    Ketamine makes her hallucinate   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   Post-nasal drip    hx of   Pre-diabetes    patient states she is not Pre-Diabetic at appt. 12/16/2018   PTSD (post-traumatic stress disorder)    RA (rheumatoid arthritis) (HCC)    autoimmune arthritis   RA (rheumatoid arthritis) (HCC)    Spondylitis (HCC)    Suicidal intent     Significant Hospital Events: Including procedures, antibiotic start and stop dates in addition to other pertinent events   8/28 - PCCM consulted for assistance with management of agitation.  Interim History / Subjective:  On evaluation, patient writhing around in bed and groaning/calling out in pain Begging for "surgery" for her R hip C/o R hip pain, no other major complaints L foot numbness, "feels like it's asleep", no tingling/paresthesias  Objective:  Blood pressure 117/67, pulse 70, temperature 97.8 F (36.6 C), temperature source Oral, resp. rate 18, height 5\' 4"  (1.626 m), weight 73.3 kg, SpO2 98%.        Intake/Output Summary (Last 24 hours) at 11/23/2022 1254 Last data filed at 11/23/2022 0455 Gross per 24 hour  Intake 768.63 ml  Output 300 ml  Net 468.63 ml   Filed Weights   11/21/22 2128  Weight: 73.3 kg   Physical Examination: General: Acutely ill-appearing middle-aged woman, writhing in bed, NAD but appears uncomfortable. HEENT: [place]/AT, anicteric sclera, PERRL, moist mucous membranes. Neuro: Awake, oriented x 3 (self, date, location; unsure of circumstances bringing her to hospital). Responds to verbal stimuli. Following commands consistently. Moves all 4 extremities spontaneously. Strength 4-5/5 in all 4 extremities.  CV: Tachycardic, regular rhythm, no m/g/r. PULM: Breathing even and unlabored on RA. Lung fields CTAB. GI: Soft, nontender, nondistended. Normoactive bowel sounds.  Extremities: No LE edema noted. +R hip pain on palpation. Lidocaine patch in place. Skin: Warm/dry, no rashes.  Resolved Hospital Problem List:     Assessment & Plan:  Altered mental status Complicated and in setting of polypharmacy including xanax, fentanyl. Has history of EtOH abuse, last drink . Ethanol on arrival <10, acetaminophen and salicylate levels negative. UDS positive for opiates, benzodiazepines, amphetamines, THC. Most recent CIWA score 21, COWS 5. She has not received  any ativan at this point. Suspect multifactorial etiology largely driven by medication withdrawal and psychiatric causes. No intracranial pathology noted on head imaging. - Phenobarb taper - Continue clonidine, Seroquel, Effexor, Haldol PRN for agitation per protocol - Monitor for signs/symptoms of withdrawal - CIWA/COWS with Ativan PRN - Continue thiamine/folate, MV  - Psych consult placed per primary team - Safety precautions, may benefit from Safety Sitter - TOC consult for rehabilitation resources  R Hip pain L foot numbness - Management per FMTS - Maxmize non-opioid medications as able (APAP, lidocaine patches, ?gabapentin) - Avoiding opioid medications in the setting of overdose - PT/OT  Rhabdomyolysis CK downtrending 4166>0630. Renal function stable. She is receiving large volume IVF and bladder scans due to retention  - Trend BMP, CK - Fluid resuscitation - Monitor I&Os, bladder scan as appropriate - Avoid nephrotoxic agents as able - Ensure adequate renal perfusion  SIRS Lactic acidosis Tachycardic with WBC 15.0. Temperature has been WNL. LA 4.0 > 2.2. CT Chest noted mild patchy opacities in bilateral upper lobes, possibly atelectasis, though aspiration not excluded. While she may have aspirated during overdose, I suspect of this inflammation is likely attributed to period of decreased perfusion during overdose and agitation. - Goal MAP > 65 - Fluid resuscitation as tolerated - Trend WBC, fever curve, LA (downtrending) - F/u Cx data  Remainder per primary-- Suppressed TSH LLE weakness Abdominal pain  Best Practice (right click and "Reselect all SmartList Selections" daily)   Per Primary Team  Signature:    Faythe Ghee [facility] 11/23/22 12:55 PM  Please see [email or web address] for pager details.  From 7A-7P if no response, please call [phone] After hours, please call ELink [phone]

## 2022-11-23 NOTE — Assessment & Plan Note (Signed)
Patient on 30 mg adderall BID. Cause of positive amphetamines on UDS.  - Hold in the setting of somnolence and possible drug overdose.

## 2022-11-23 NOTE — Assessment & Plan Note (Signed)
Initial somnolence/drowsiness after drug overdose with fentanyl. Then highly agitated likely 2/2 polysubstance withdrawal vs primary psychiatric disease. - Haldol 5mg  q8 PRN for agitation per psych - Phenobarb taper per CCM - clonidine 0.1mg  TID to help with opiate withdrawal and BP - Continue cardiopulmonary monitoring  - continue CIWA, COWS q 4 - Neuro checks q 4  - Fall precautions, seizure precautions  - Avoid medications that could alter mental status  - Monitor for signs of infection and restart ABX if necessary  - F/u Bcx - Ng x 24 hours

## 2022-11-23 NOTE — Discharge Summary (Addendum)
Family Medicine Teaching [facility] AMA Summary  Patient name: Kara [name] Medical record number: [id number] Date of birth: 1956-02-12 Age: 67 y.o. Gender: female Date of Admission: 11/21/2022  Date of Discharge:Patient was not discharged, left AMA 8/29 Admitting Physician: Peterson Ao, MD  Primary Care Provider: Irena Reichmann, DO Consultants: psych, CCM  Indication for Hospitalization: AMS 2/2 fentanyl overdose  Principal Problem for Admission: altered mental status Other Problems addressed during stay:  Principal Problem:   Accidental fentanyl overdose (HCC) Active Problems:   Hypertension   ADD (attention deficit disorder)   Altered mental status   Abdominal tenderness   Rhabdomyolysis   Numbness of left foot   Chronic right hip pain   MDD (major depressive disorder)   Anxiety state   Opioid Use Disorder   Encephalopathy acute   Weakness of left foot    Brief Hospital Course:  Kara [name] is a 67 y.o.female with a history of chronic pain, anxiety/depression, and suicide attempts who was admitted to the [facility] Medicine Teaching Service at [facility] for altered mental status. Her hospital course is detailed below:  Altered mental status Secondary to opioid overdose as patient was found down at home wearing 3 fentanyl patches. UDS positive for THC, amphetamines (takes Adderall), benzodiazepines (takes Xanax).  Responsive to Narcan in the ED.  CT head negative.  Somnolent initially, CIWA and COWS initiated and started on clonidine to help with opioid withdrawal.  Restarted home Effexor and Seroquel due to anxiety.  The morning after admission, became highly agitated. With recommendations from Psychiatry and CCM, pt given IM haldol and started p.o. phenobarb taper due to concern for polysubstance withdrawal. However, she left AMA before completing phenobarbital and clonidine taper. She had capacity at time of leaving AMA.   Rhabdomyolysis Due to immobility as patient was  down at home for an unknown amount of time. Elevated CK to 8900 with hematuria. Improved with IVF.  Creatinine stable throughout admission.  Urinary retention Suspect secondary to substance overdose.  Found to have greater than 400 mL residual on bladder scan the night of admission.  In and out catheterization produced over 1 L of urine.  Patient able to urinate independently by the following day.  Left foot numbness Patient complained of this on night of admission.  Ex-husband reports this is a chronic problem.  CT head on admission negative.   Right hip pain Patient seeking out right hip replacement, missed pre-op appointment with Ortho during admission.  Pain control difficult given patient's presentation with opioid overdose.  Pt wants to leave AMA since she cannot have her hip surgery here.   Other chronic conditions were medically managed with home medications and formulary alternatives as necessary (anxiety, depression, HTN)  PCP Follow-up Recommendations: Wean Xanax, opioid pain medications Re-engage with ortho for R hip replacement Repeat TSH outpatient, likely euthyroid sick syndrome but may have hypothyroidism  Disposition: AMA   Exam upon leaving:  Vitals:   11/23/22 0749 11/23/22 1149  BP: (!) 148/61 117/67  Pulse: 66 70  Resp: 18 18  Temp: 98.1 F (36.7 C) 97.8 F (36.6 C)  SpO2: 94% 98%   Physical Exam: General: Laying in bed facing away unless spoken to, no acute distress Respiratory:normal work of breathing Extremities: spontaneous movement all four extremities  Psych: Dismissive, saying she will leave AMA   Significant Labs and Imaging:  Recent Labs  Lab 11/21/22 1508 11/21/22 1515 11/22/22 0639  WBC  --  16.3* 15.0*  HGB 14.6  14.3 13.3  11.6*  HCT 43.0  42.0 44.3 37.9  PLT  --  417* 416*   Recent Labs  Lab 11/21/22 1508 11/21/22 1515 11/22/22 0639  NA 136  136 136 139  K 4.3  4.2 3.9 3.8  CL 99 97* 103  CO2  --  23 26  GLUCOSE 118*  121* 116*  BUN 20 16 11   CREATININE 0.50 0.63 0.92  CALCIUM  --  9.5 8.8*  MG  --  2.2  --   ALKPHOS  --  102 78  AST  --  64* 70*  ALT  --  29 35  ALBUMIN  --  3.9 2.8*   Upper Level Attestation I have seen and examined the patient with the resident Dr. Rexene Alberts. I agree with the history, physical, and assessment above with any necessary edits.   Lockie Mola, MD  Family Medicine Teaching Service

## 2022-11-23 NOTE — Progress Notes (Signed)
In an evaluation of capacity, each of the following criteria must be met based on medical necessity in order for a patient to have capacity to make the decision in question. Of note, the capacity evaluation assesses only for the specified decision documented below and is not a determination of the patient's overall competency, which can only be adjudicated.  Criterion 1: The patient demonstrates a clear and consistent voluntary choice with regard to treatment options.  Does not meet this criteria. Patient has been refusing some treatments. When asked about this she stated, "That is a lie."  When asked about the consequences of refusing treatment she stated that she did not want to talk about it and ended the conversation.   Criterion 2: The patient adequately understands the disease they have, the treatment proposed, the risks of treatment, and the risks of other treatment (including no treatment).  Does not meet this criteria. Asked patient to explain why she is currently in the hospital to which she responded, "Because of my right hip pain." Clarified that patient was found down at home wearing multiple fentanyl patches to which patient responded "I do not want to talk about that."  Asked patient to explain why she needs treatment and she stated "I know why, I am a counselor."  Pt refused when asked to elaborate on this.   Criterion 3: The patient acknowledges that the details of Criterion 2 apply to them specifically and the likely consequences of treatment options proposed.  Does not meet this criteria. Pt refused to participate in conversation about reason for current hospitalization and treatment options.  Criterion 4: The patient demonstrates adequate reasoning/rationality within the context of their decision and can provide justification for their choice.  Does not meet this criteria as above.  In this case, the patient does not have capacity to refuse medical treatment.

## 2022-11-23 NOTE — Assessment & Plan Note (Addendum)
Unclear whether acute or chronic. Ex-husband states this is a chronic problem. Pt adamant overnight that this is a new issue. CT head/neck on admission negative for acute changes. TSH low. -f/u thiamine, B12, folate, free T4  -PT eval and treat -continue to monitor

## 2022-11-23 NOTE — Progress Notes (Addendum)
FMTS Brief Progress Note  S: Notified by RN that patient experiencing "numbness" of left leg.  Evaluated patient at bedside, conducted exam.  Patient reporting that her left leg has been feeling numb "like when you go to the dentist and they numb your mouth" this morning.  Was complaining of numbness and unusual sensation of left foot yesterday, now extending to more of the L leg.  O: BP 133/81 (BP Location: Left Arm)   Pulse 68   Temp 98.1 F (36.7 C) (Oral)   Resp 16   Ht 5\' 4"  (1.626 m) Comment: per pt  Wt 73.3 kg   SpO2 99%   BMI 27.74 kg/m   General: Elderly female, appears older than stated age, resting in bed with mild agitation, but otherwise not acutely distressed.  Neurological Examination: MS: Awake, alert, interactive. Normal eye contact, answered the questions appropriately, speech was fluent, normal comprehension.  Attention and concentration were normal. Cranial Nerves: Pupils were equal and reactive to light (5-3mm);  EOM normal, no nystagmus; no ptsosis, intact facial sensation, face symmetric with full strength of facial muscles, tongue protrusion is symmetric with full movement to both sides.  Sternocleidomastoid with normal strength. Strength: Still with impaired dorsiflexion of L foot, unchanged from yesterday.  Able to move all 4 extremities overall. Sensation: Sensation intact on bilateral LE, but states that touch feels "different" on LLE versus RLE.  A/P: Exam mostly unchanged from yesterday, possibly some new LLE paraesthesias, difference in sensation.  Believe this is more likely due to neuropathy than stroke given otherwise normal neuro exam.  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  May consider gabapentin in the future, though with recently altered mental status and polypharmacy picture, hesitant to add more centrally acting meds.  At this time, not  planning to obtain advanced imaging due to low concern for stroke or other acute intracranial process. Will do a peripheral neuropathy workup, RPR already negative and TSH low, thought to be euthyroid sick syndrome.  May need outpatient follow up TSH, but will obtain T4 right now too. - T4, folate, and B12  Kara Bossi, MD 11/23/2022, 5:50 AM PGY-1, [facility] Night Resident  Please page [phone] with questions.
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
## 2022-11-24 NOTE — Consult Note (Signed)
[facility] Customer service manager [facility]) Accountable Care Organization (ACO) [facility] Liaison Note  11/24/2022  Kara [name] Jul 04, 1955 [id number]  Location: [facility] Liaison screened the patient remotely at [facility].  Insurance: [employer]   Kara [name] is a 67 y.o. female who is a Primary Care Patient of Irena Reichmann, DO [facility]). The patient was screened for readmission hospitalization with noted extreme risk score for unplanned readmission risk with 1 IP/8 ED in 6 months.  The patient was assessed for potential [facility]) Care Management service needs for post hospital transition for care coordination. Review of patient's electronic medical record reveals patient was admitted with Encephalopathy.  Pt left the hospital AMA on 11/23/2022.   Plan: [facility] Liaison will continue to follow progress and disposition to asess for post hospital community care coordination/management needs.  Referral request for community care coordination: anticipate [facility] Transitions of Care Team follow up.   [facility] Care Management/Population Health does not replace or interfere with any arrangements made by the Inpatient Transition of Care team.   For questions contact:   Elliot Cousin, RN, [facility] Liaison [place]   Population Health Office Hours MTWF  8:00 am-6:00 pm [phone] mobile [phone] [Office toll free line] Office Hours are M-F 8:30 - 5 pm [email or web address]

## 2022-11-26 LAB — CULTURE, BLOOD (ROUTINE X 2)
Culture: NO GROWTH
Culture: NO GROWTH
Special Requests: ADEQUATE
Special Requests: ADEQUATE

## 2022-11-27 ENCOUNTER — Encounter (HOSPITAL_COMMUNITY): Payer: Self-pay | Admitting: Emergency Medicine

## 2022-11-27 ENCOUNTER — Other Ambulatory Visit: Payer: Self-pay

## 2022-11-27 ENCOUNTER — Emergency Department (HOSPITAL_COMMUNITY): Payer: Medicare Other

## 2022-11-27 ENCOUNTER — Emergency Department (HOSPITAL_COMMUNITY)
Admission: EM | Admit: 2022-11-27 | Discharge: 2022-11-27 | Disposition: A | Discharge status: Home / Self Care | Payer: Medicare Other | Attending: Emergency Medicine | Admitting: Emergency Medicine

## 2022-11-27 DIAGNOSIS — S79911A Unspecified injury of right hip, initial encounter: Secondary | ICD-10-CM | POA: Diagnosis not present

## 2022-11-27 DIAGNOSIS — W1839XA Other fall on same level, initial encounter: Secondary | ICD-10-CM | POA: Insufficient documentation

## 2022-11-27 DIAGNOSIS — M1611 Unilateral primary osteoarthritis, right hip: Secondary | ICD-10-CM | POA: Diagnosis not present

## 2022-11-27 DIAGNOSIS — M25551 Pain in right hip: Secondary | ICD-10-CM | POA: Diagnosis not present

## 2022-11-27 DIAGNOSIS — M25572 Pain in left ankle and joints of left foot: Secondary | ICD-10-CM | POA: Insufficient documentation

## 2022-11-27 DIAGNOSIS — S99922A Unspecified injury of left foot, initial encounter: Secondary | ICD-10-CM | POA: Diagnosis not present

## 2022-11-27 DIAGNOSIS — M25552 Pain in left hip: Secondary | ICD-10-CM | POA: Diagnosis not present

## 2022-11-27 DIAGNOSIS — S99912A Unspecified injury of left ankle, initial encounter: Secondary | ICD-10-CM | POA: Diagnosis not present

## 2022-11-27 DIAGNOSIS — Z743 Need for continuous supervision: Secondary | ICD-10-CM | POA: Diagnosis not present

## 2022-11-27 DIAGNOSIS — W19XXXA Unspecified fall, initial encounter: Secondary | ICD-10-CM | POA: Diagnosis not present

## 2022-11-27 DIAGNOSIS — M7752 Other enthesopathy of left foot: Secondary | ICD-10-CM | POA: Diagnosis not present

## 2022-11-27 NOTE — Discharge Instructions (Signed)
You can follow-up with your primary care doctor or your orthopedist.
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
## 2022-11-27 NOTE — ED Triage Notes (Addendum)
Pt BIB [facility] from home after a fall x 1 hr ago, pt reports falls since July, hx of R hip necrosis pt scheduled for hip replacement in Sept, c/o left ankle pain with decrease in sensation, pt able to move, no obvious deformity, + pulses, v/s en route: 120/70, HR 100, RR18,  O2 89%, placed on 4L, CBG 111, not on thinners, pt reports increased pain to R hip
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
## 2022-11-28 LAB — VITAMIN B1: Vitamin B1 (Thiamine): 97.3 nmol/L (ref 66.5–200.0)

## 2022-11-29 ENCOUNTER — Telehealth: Payer: Self-pay | Admitting: *Deleted

## 2022-11-29 DIAGNOSIS — M25551 Pain in right hip: Secondary | ICD-10-CM | POA: Diagnosis not present

## 2022-11-29 DIAGNOSIS — M069 Rheumatoid arthritis, unspecified: Secondary | ICD-10-CM | POA: Diagnosis not present

## 2022-11-29 NOTE — Patient Outreach (Incomplete)
  Care Coordination   11/29/2022 Name: Kara [name] MRN: [id number] DOB: 01-12-1956   Care Coordination Outreach Attempts:  An unsuccessful telephone outreach was attempted today to offer the patient information about available care coordination services.  Follow Up Plan:  Additional outreach attempts will be made to offer the patient care coordination information and services.   Encounter Outcome:  No Answer {THN Tip this will not be part of the note when signed-REQUIRED REPORT FIELD DO NOT DELETE (Optional):27901}  Care Coordination Interventions:  No, not indicated  {THN Tip this will not be part of the note when signed-REQUIRED REPORT FIELD DO NOT DELETE (Optional):27901}  Reece Levy, MSW, LCSW Clinical Social Worker [phone]

## 2022-11-30 ENCOUNTER — Ambulatory Visit: Payer: Self-pay | Admitting: *Deleted

## 2022-11-30 NOTE — Patient Instructions (Signed)
Visit Information  Thank you for taking time to visit with me today. Please don't hesitate to contact me if I can be of assistance to you.   Following are the goals we discussed today:   Goals Addressed             This Visit's Progress    Need to get hip replacement done       Activities and task to complete in order to accomplish goals.   Continue with your bi-weekly counseling appointments with Albin Felling and discuss your specific needs as discussed today Start / continue relaxed breathing 3 times daily Keep all upcoming appointment discussed today Continue with compliance of taking medication prescribed by Doctor Self Support options  (call [phone] for any urgent mental health support needs Call 911 for emergent needs         Our next appointment is by telephone on 12/13/22  Please call the care guide team at [phone] if you need to cancel or reschedule your appointment.   If you are experiencing a Mental Health or Behavioral Health Crisis or need someone to talk to, please call the Suicide and Crisis Lifeline: 988 call 911   The patient verbalized understanding of instructions, educational materials, and care plan provided today and DECLINED offer to receive copy of patient instructions, educational materials, and care plan.   Telephone follow up appointment with care management team member scheduled for: 12/13/22 Reece Levy, MSW, LCSW Clinical Social Worker [phone]

## 2022-11-30 NOTE — Patient Outreach (Signed)
  Care Coordination   Initial Visit Note   11/30/2022 Name: Kara [name] MRN: [id number] DOB: 11/30/1955  Farm Gregory is a 66 y.o. year old female who sees Irena Reichmann, [place] for primary care. I spoke with  [name] by phone today.  What matters to the patients health and wellness today?  I need my hip replaced- in so much pain.    Goals Addressed             This Visit's Progress    Need to get hip replacement done       Activities and task to complete in order to accomplish goals.   Continue with your bi-weekly counseling appointments with Albin Felling and discuss your specific needs as discussed today Start / continue relaxed breathing 3 times daily Keep all upcoming appointment discussed today Continue with compliance of taking medication prescribed by Doctor Self Support options  (call [phone] for any urgent mental health support needs Call 911 for emergent needs         SDOH assessments and interventions completed:  Yes  SDOH Interventions Today    Flowsheet Row Most Recent Value  SDOH Interventions   Food Insecurity Interventions Intervention Not Indicated  Housing Interventions Intervention Not Indicated  Transportation Interventions Intervention Not Indicated  Depression Interventions/Treatment  Currently on Treatment, Counseling  Stress Interventions Other (Comment), Provide Counseling  [Pt shares she is needing THR but the Surgeon is delaying it due to  "state of mind"]        Care Coordination Interventions:  Yes, provided  Interventions Today    Flowsheet Row Most Recent Value  General Interventions   General Interventions Discussed/Reviewed Doctor Visits  [Pt reports Dr Thomasena Edis "as cleared me for surgery"]  Doctor Visits Discussed/Reviewed Doctor Visits Discussed, Specialist  [Pt shared that the Orthopedic Surgeon told her he would not do surgery on her now because of her "state of mind". Pt acknowledges knowing "need to lower my voice".]  Mental Health  Interventions   Mental Health Discussed/Reviewed Mental Health Discussed, Mental Health Reviewed, Coping Strategies, Depression, Anxiety, Substance Abuse, Other   [Pt admits to depression and "many many years" of suicidal thoughts/actions/ideations. She denies SI/HI at this time. Pt is active with Dr [name], Psychiatry, as well as Driscilla Grammes, Therapist. She plans to see Albin Felling next Monday for her q2 weeks visits.]  Pharmacy Interventions   Pharmacy Dicussed/Reviewed Pharmacy Topics Discussed  Safety Interventions   Safety Discussed/Reviewed Safety Discussed, Safety Reviewed  [Pt reports her "ex-husband" (Legally separated) helps her some. Pt made aware of the 988 number for mental health support/crisis line use. Pt denies SI/HI at this time.]       Follow up plan: Follow up call scheduled for 12/13/22    Encounter Outcome:  Patient Visit Completed

## 2022-12-01 ENCOUNTER — Emergency Department (HOSPITAL_COMMUNITY): Payer: Medicare Other

## 2022-12-01 ENCOUNTER — Other Ambulatory Visit: Payer: Self-pay

## 2022-12-01 ENCOUNTER — Encounter (HOSPITAL_COMMUNITY): Payer: Self-pay

## 2022-12-01 ENCOUNTER — Emergency Department (HOSPITAL_COMMUNITY)
Admission: EM | Admit: 2022-12-01 | Discharge: 2022-12-01 | Disposition: A | Discharge status: Home / Self Care | Payer: Medicare Other | Attending: Emergency Medicine | Admitting: Emergency Medicine

## 2022-12-01 DIAGNOSIS — W19XXXA Unspecified fall, initial encounter: Secondary | ICD-10-CM | POA: Diagnosis not present

## 2022-12-01 DIAGNOSIS — R42 Dizziness and giddiness: Secondary | ICD-10-CM | POA: Insufficient documentation

## 2022-12-01 DIAGNOSIS — S40012A Contusion of left shoulder, initial encounter: Secondary | ICD-10-CM | POA: Insufficient documentation

## 2022-12-01 DIAGNOSIS — R519 Headache, unspecified: Secondary | ICD-10-CM | POA: Diagnosis not present

## 2022-12-01 DIAGNOSIS — M19012 Primary osteoarthritis, left shoulder: Secondary | ICD-10-CM | POA: Diagnosis not present

## 2022-12-01 DIAGNOSIS — Z981 Arthrodesis status: Secondary | ICD-10-CM | POA: Diagnosis not present

## 2022-12-01 DIAGNOSIS — Z96611 Presence of right artificial shoulder joint: Secondary | ICD-10-CM | POA: Diagnosis not present

## 2022-12-01 DIAGNOSIS — Z79899 Other long term (current) drug therapy: Secondary | ICD-10-CM | POA: Diagnosis not present

## 2022-12-01 DIAGNOSIS — S4992XA Unspecified injury of left shoulder and upper arm, initial encounter: Secondary | ICD-10-CM | POA: Diagnosis not present

## 2022-12-01 DIAGNOSIS — W1839XA Other fall on same level, initial encounter: Secondary | ICD-10-CM | POA: Diagnosis not present

## 2022-12-01 DIAGNOSIS — G8929 Other chronic pain: Secondary | ICD-10-CM | POA: Diagnosis not present

## 2022-12-01 DIAGNOSIS — R6889 Other general symptoms and signs: Secondary | ICD-10-CM | POA: Diagnosis not present

## 2022-12-01 DIAGNOSIS — Z043 Encounter for examination and observation following other accident: Secondary | ICD-10-CM | POA: Diagnosis not present

## 2022-12-01 DIAGNOSIS — K4090 Unilateral inguinal hernia, without obstruction or gangrene, not specified as recurrent: Secondary | ICD-10-CM | POA: Diagnosis not present

## 2022-12-01 DIAGNOSIS — Z743 Need for continuous supervision: Secondary | ICD-10-CM | POA: Diagnosis not present

## 2022-12-01 DIAGNOSIS — M8580 Other specified disorders of bone density and structure, unspecified site: Secondary | ICD-10-CM | POA: Diagnosis not present

## 2022-12-01 DIAGNOSIS — M25551 Pain in right hip: Secondary | ICD-10-CM | POA: Diagnosis not present

## 2022-12-01 DIAGNOSIS — M1611 Unilateral primary osteoarthritis, right hip: Secondary | ICD-10-CM | POA: Diagnosis not present

## 2022-12-01 DIAGNOSIS — I10 Essential (primary) hypertension: Secondary | ICD-10-CM | POA: Diagnosis not present

## 2022-12-01 DIAGNOSIS — R404 Transient alteration of awareness: Secondary | ICD-10-CM | POA: Diagnosis not present

## 2022-12-01 DIAGNOSIS — M25512 Pain in left shoulder: Secondary | ICD-10-CM | POA: Diagnosis not present

## 2022-12-01 DIAGNOSIS — R0989 Other specified symptoms and signs involving the circulatory and respiratory systems: Secondary | ICD-10-CM | POA: Diagnosis not present

## 2022-12-01 DIAGNOSIS — T07XXXA Unspecified multiple injuries, initial encounter: Secondary | ICD-10-CM

## 2022-12-01 DIAGNOSIS — I517 Cardiomegaly: Secondary | ICD-10-CM | POA: Diagnosis not present

## 2022-12-01 DIAGNOSIS — I700 Atherosclerosis of aorta: Secondary | ICD-10-CM | POA: Diagnosis not present

## 2022-12-01 DIAGNOSIS — I499 Cardiac arrhythmia, unspecified: Secondary | ICD-10-CM | POA: Diagnosis not present

## 2022-12-01 DIAGNOSIS — R918 Other nonspecific abnormal finding of lung field: Secondary | ICD-10-CM | POA: Diagnosis not present

## 2022-12-01 LAB — CBC WITH DIFFERENTIAL/PLATELET
Abs Immature Granulocytes: 0.04 10*3/uL (ref 0.00–0.07)
Basophils Absolute: 0 10*3/uL (ref 0.0–0.1)
Basophils Relative: 0 %
Eosinophils Absolute: 0.2 10*3/uL (ref 0.0–0.5)
Eosinophils Relative: 2 %
HCT: 40.5 % (ref 36.0–46.0)
Hemoglobin: 12.1 g/dL (ref 12.0–15.0)
Immature Granulocytes: 0 %
Lymphocytes Relative: 21 %
Lymphs Abs: 2 10*3/uL (ref 0.7–4.0)
MCH: 24.4 pg — ABNORMAL LOW (ref 26.0–34.0)
MCHC: 29.9 g/dL — ABNORMAL LOW (ref 30.0–36.0)
MCV: 81.8 fL (ref 80.0–100.0)
Monocytes Absolute: 0.6 10*3/uL (ref 0.1–1.0)
Monocytes Relative: 6 %
Neutro Abs: 6.9 10*3/uL (ref 1.7–7.7)
Neutrophils Relative %: 71 %
Platelets: 434 10*3/uL — ABNORMAL HIGH (ref 150–400)
RBC: 4.95 MIL/uL (ref 3.87–5.11)
RDW: 15.9 % — ABNORMAL HIGH (ref 11.5–15.5)
WBC: 9.7 10*3/uL (ref 4.0–10.5)
nRBC: 0 % (ref 0.0–0.2)

## 2022-12-01 LAB — COMPREHENSIVE METABOLIC PANEL
ALT: 29 U/L (ref 0–44)
AST: 26 U/L (ref 15–41)
Albumin: 3.9 g/dL (ref 3.5–5.0)
Alkaline Phosphatase: 80 U/L (ref 38–126)
Anion gap: 9 (ref 5–15)
BUN: 17 mg/dL (ref 8–23)
CO2: 24 mmol/L (ref 22–32)
Calcium: 9 mg/dL (ref 8.9–10.3)
Chloride: 104 mmol/L (ref 98–111)
Creatinine, Ser: 0.74 mg/dL (ref 0.44–1.00)
GFR, Estimated: >60 mL/min (ref >60)
Glucose, Bld: 161 mg/dL — ABNORMAL HIGH (ref 70–99)
Potassium: 3.1 mmol/L — ABNORMAL LOW (ref 3.5–5.1)
Sodium: 137 mmol/L (ref 135–145)
Total Bilirubin: 0.3 mg/dL (ref 0.3–1.2)
Total Protein: 7.2 g/dL (ref 6.5–8.1)

## 2022-12-01 LAB — TROPONIN I (HIGH SENSITIVITY): Troponin I (High Sensitivity): 3 ng/L (ref <18)

## 2022-12-01 LAB — CK: Total CK: 207 U/L (ref 38–234)

## 2022-12-01 LAB — ETHANOL: Alcohol, Ethyl (B): <10 mg/dL (ref <10)

## 2022-12-01 MED ORDER — POTASSIUM CHLORIDE CRYS ER 20 MEQ PO TBCR
40.0000 meq | EXTENDED_RELEASE_TABLET | Freq: Once | ORAL | Status: AC
  Administered 2022-12-01: 40 meq via ORAL
  Filled 2022-12-01: qty 2

## 2022-12-01 MED ORDER — HALOPERIDOL LACTATE 5 MG/ML IJ SOLN
5.0000 mg | Freq: Once | INTRAMUSCULAR | Status: AC
  Administered 2022-12-01: 5 mg via INTRAMUSCULAR
  Filled 2022-12-01: qty 1

## 2022-12-01 MED ORDER — ZIPRASIDONE MESYLATE 20 MG IM SOLR: 20.0000 mg | Freq: Once | INTRAMUSCULAR | Status: DC

## 2022-12-01 MED ORDER — ALPRAZOLAM 0.5 MG PO TABS
1.0000 mg | ORAL_TABLET | Freq: Once | ORAL | Status: AC
  Administered 2022-12-01: 1 mg via ORAL
  Filled 2022-12-01: qty 2

## 2022-12-01 MED ORDER — HYDROCODONE-ACETAMINOPHEN 5-325 MG PO TABS
1.0000 | ORAL_TABLET | Freq: Once | ORAL | Status: AC
  Administered 2022-12-01: 1 via ORAL
  Filled 2022-12-01: qty 1

## 2022-12-01 NOTE — Discharge Instructions (Signed)
Your testing is negative for serious injury from the fall today.  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  Follow-up with your primary doctor as well as orthopedic doctor.  Return to the ED with new or worsening symptoms.

## 2022-12-01 NOTE — ED Notes (Signed)
Patient transported to CT 

## 2022-12-01 NOTE — ED Notes (Signed)
Shortly after physician left room, Pt was found sitting in floor. Pt had removed gown and Bp cuff, EKG, and spo2 monitor. Pt stated she had climbed onto the floor because she was "thirsty." Pt was aggressive with ED staff when attempting to help the Pt off the floor and back into bed. Pt assisted back to bed, and again coached on use of call-bell for assistance.

## 2022-12-01 NOTE — ED Notes (Signed)
Pt moved to hallway bed for increased monitoring due to erratic and aggressive behavior towards staff. Pt throwing blankets into the floor and aggressively shaking bed rails while yelling at staff.

## 2022-12-01 NOTE — ED Notes (Signed)
Pt stated to this RN " if you do not give me something for pain I will throw myself in the floor and slam my head against the door until you give me something". Pt cursing and yelling at staff during encounter. Rancour MD notified.

## 2022-12-01 NOTE — ED Notes (Signed)
Pt attempting to open bag of pills in room witnessed by this RN. Pill bag collected by this RN and placed at nurses station. Pt instructed that she could not take personal medication without MD approval in the ED. MD made aware.

## 2022-12-01 NOTE — ED Triage Notes (Signed)
Pt presents via EMS from home with multiple complaints. Pt reports right left pain x5 months, dizziness for the past several months, left foot pain and numbness x4 days, and left shoulder pain. Reports hit left should on wall tonight causing pain. Denies fall.

## 2022-12-01 NOTE — ED Provider Notes (Signed)
EMERGENCY DEPARTMENT AT [facility] Provider Note   CSN: [id number] Arrival date & time: 12/01/22  [id number]     History  Chief Complaint  Patient presents with   Dizziness    Kara [name] is a 67 y.o. female.  Patient with a history of fibromyalgia, depression, chronic pain, hypertension, rheumatoid arthritis, degenerative disc disease, chronic right hip pain here with fall.  States reason for visit today is "I need a hip replacement". States she has fallen several times in the past 2 days.  She is a difficult historian and aggressive with any kind of questioning asked. States she fell today as well as fell yesterday while trying to use her rolling walker.  Today she struck the left side of her head and her shoulder and heard a crack to her shoulder and upper arm.  Denies any preceding dizziness or lightheadedness.  Has ongoing right hip pain which is a chronic issue for her for the past 5 months.  Does not believe she hurt her hip in the fall today.  She saw her surgeon 2 days ago and was told she is "too high risk for surgery" because of her chronic medical conditions and previous substance abuse.  Notably she was admitted to the hospital 2 weeks ago with altered mental status in the setting of suspected opiate overdose and polypharmacy.  She is no longer on any fentanyl patches or other opiates.  Still take Xanax, Adderall, Seroquel, trazodone  She is oriented to person place and time.  States she is here because she keeps on falling and wants to have a hip replacement.  She hit her head today and her left shoulder. She becomes belligerent with any kind of questions being asked and is aggressive with staff.  The history is provided by the patient and the EMS personnel. The history is limited by the condition of the patient.  Dizziness Associated symptoms: headaches   Associated symptoms: no chest pain, no nausea, no shortness of breath, no vomiting and no weakness         Home Medications Prior to Admission medications   Medication Sig Start Date End Date Taking? Authorizing Provider  ALPRAZolam Prudy Feeler) 1 MG tablet Take 1 mg by mouth daily as needed for anxiety.  10/07/19   [provider]  amphetamine-dextroamphetamine (ADDERALL XR) 30 MG 24 hr capsule Take 60 mg by mouth every morning. 09/30/19   [provider]  amphetamine-dextroamphetamine (ADDERALL) 30 MG tablet Take 30 mg by mouth every evening. 09/30/19   [provider]  carbamazepine (TEGRETOL) 200 MG tablet Take 1 tablet (200 mg total) by mouth 3 (three) times daily. Patient not taking: Reported on 10/08/2019 12/20/18   Malvin Johns, MD  cyclobenzaprine (FLEXERIL) 10 MG tablet Take 10 mg by mouth 2 (two) times daily as needed for muscle spasms.  09/25/19   [provider]  lisinopril (PRINIVIL,ZESTRIL) 10 MG tablet TAKE 1 AND 1/2 TABLETS DAILY BY MOUTH Patient taking differently: Take 15 mg by mouth daily. 03/07/18   Kuneff, Renee A, DO  meloxicam (MOBIC) 15 MG tablet TAKE 1 TABLET (15 MG) BY MOUTH EVERY DAY Patient taking differently: Take 15 mg by mouth daily. 07/06/17   Kerrin Champagne, MD  QUEtiapine (SEROQUEL) 25 MG tablet Take 25 mg by mouth at bedtime.    [provider]  traZODone (DESYREL) 50 MG tablet Take 50 mg by mouth at bedtime.    [provider]  tretinoin (RETIN-A) 0.1 % cream  Apply 1 application topically at bedtime as needed (facial blemishes).  01/28/18   [provider]  valACYclovir (VALTREX) 1000 MG tablet Take 2 tablets (2,000 mg total) by mouth 2 (two) times daily. Patient taking differently: Take 2,000 mg by mouth daily as needed (for outbreaks). 07/21/18   Ofilia Neas, PA-C  venlafaxine XR (EFFEXOR-XR) 75 MG 24 hr capsule Take 75 mg by mouth daily with breakfast.    [provider]      Allergies    Aspirin, Bee venom, Ibuprofen, Ativan [lorazepam], Ketamine, Toradol [ketorolac tromethamine], and Tramadol  hcl    Review of Systems   Review of Systems  Constitutional:  Negative for fever.  HENT:  Negative for congestion and rhinorrhea.   Respiratory:  Negative for cough, chest tightness and shortness of breath.   Cardiovascular:  Negative for chest pain.  Gastrointestinal:  Negative for abdominal pain, nausea and vomiting.  Musculoskeletal:  Positive for arthralgias, back pain and myalgias.  Skin:  Negative for rash.  Neurological:  Positive for dizziness and headaches. Negative for weakness.   all other systems are negative except as noted in the HPI and PMH.    Physical Exam Updated Vital Signs BP 99/64   Pulse (!) 104   Temp 98.3 F (36.8 C) (Oral)   Resp 16   SpO2 93%  Physical Exam Vitals and nursing note reviewed.  Constitutional:      General: She is not in acute distress.    Appearance: She is well-developed.     Comments: Hoarse voice Aggressive and belligerent with questions  HENT:     Head: Normocephalic and atraumatic.     Mouth/Throat:     Pharynx: No oropharyngeal exudate.  Eyes:     Conjunctiva/sclera: Conjunctivae normal.     Pupils: Pupils are equal, round, and reactive to light.  Neck:     Comments: No midline C spine pain Cardiovascular:     Rate and Rhythm: Normal rate and regular rhythm.     Heart sounds: Normal heart sounds. No murmur heard. Pulmonary:     Effort: Pulmonary effort is normal. No respiratory distress.     Breath sounds: Normal breath sounds.  Abdominal:     Palpations: Abdomen is soft.     Tenderness: There is no abdominal tenderness. There is no guarding or rebound.  Musculoskeletal:        General: Tenderness present.     Cervical back: Normal range of motion and neck supple.     Comments: TTP lateral shoulder, pain with ROM. No deformity.  Radial pulse intact Pain with ROM R hip. No shortening or external rotation Intact DP and PT pulses.  Skin:    General: Skin is warm.  Neurological:     Mental Status: She is alert and  oriented to person, place, and time.     Cranial Nerves: No cranial nerve deficit.     Motor: No abnormal muscle tone.     Coordination: Coordination normal.     Comments:  5/5 strength throughout. CN 2-12 intact.Equal grip strength.   Psychiatric:        Behavior: Behavior normal.     Comments: Rude and aggressive behavior with staff.     ED Results / Procedures / Treatments   Labs (all labs ordered are listed, but only abnormal results are displayed) Labs Reviewed  CBC WITH DIFFERENTIAL/PLATELET - Abnormal; Notable for the following components:      Result Value   MCH 24.4 (*)  MCHC 29.9 (*)    RDW 15.9 (*)    Platelets 434 (*)    All other components within normal limits  COMPREHENSIVE METABOLIC PANEL - Abnormal; Notable for the following components:   Potassium 3.1 (*)    Glucose, Bld 161 (*)    All other components within normal limits  CK  ETHANOL  RAPID URINE DRUG SCREEN, HOSP PERFORMED  URINALYSIS, ROUTINE W REFLEX MICROSCOPIC  TROPONIN I (HIGH SENSITIVITY)  TROPONIN I (HIGH SENSITIVITY)    EKG None  Radiology CT Shoulder Left Wo Contrast  Result Date: 12/01/2022 CLINICAL DATA:  Left shoulder trauma. Several months history of ongoing left shoulder pain. Frequent falls. EXAM: CT OF THE UPPER LEFT EXTREMITY WITHOUT CONTRAST TECHNIQUE: Multidetector CT imaging of the upper left extremity was performed according to the standard protocol. RADIATION DOSE REDUCTION: This exam was performed according to the departmental dose-optimization program which includes automated exposure control, adjustment of the mA and/or kV according to patient size and/or use of iterative reconstruction technique. COMPARISON:  Right shoulder series and right humerus series earlier today. FINDINGS: Bones/Joint/Cartilage There is mild osteopenia without evidence of fractures or dislocation. The [place] joint is intact with trace spurring. There is moderate to advanced arthrosis of the glenohumeral joint  with bone-on-bone joint space loss inferiorly, moderate humeral head osteophytosis, and mild spurring about the glenoid process more so inferiorly. There are small osteochondral loose bodies in the anterior and posterior joint space. There are 2 osteochondral bodies, 1 measuring 1.6 cm and the other 0.9 cm, in the subcoracoid bursal space and a small amount of bursal fluid. No significant glenohumeral joint fluid is seen. There is narrowing of the acromiohumeral space to 5 mm. The visualized left ribs are intact. The visualized left scapula is intact. Ligaments Suboptimally assessed by CT. Muscles and Tendons The rotator cuff muscles are grossly normal in bulk. The tendons are not well seen with this technique but there does appear to be at least some thinning of the supraspinatus distal tendon if not also a partial insertional tear. No intramuscular hematoma is seen Soft tissues There is a calcified granuloma in the left upper lobe. The visualized left lung is otherwise clear. Atherosclerosis is present in the distal aortic arch. Calcified left hilar lymph nodes. Fatty stranding in the left axilla is seen, and could be traumatic or inflammatory in etiology. There are no axillary hematoma. There is no axillary adenopathy. IMPRESSION: 1. Osteopenia and degenerative change without evidence of fractures or dislocation. 2. Intra-articular loose bodies. Subacromial bursal loose bodies also. 3. Narrowing of the acromiohumeral space to 5 mm with at least some thinning of the distal supraspinatus tendon if not also a partial insertional tear. 4. Fatty stranding in the left axilla which could be traumatic or inflammatory in etiology. No axillary or intramuscular hematoma is seen. 5. Aortic atherosclerosis. 6. Old granulomatous disease. Aortic Atherosclerosis (ICD10-I70.0). Electronically Signed   By: Almira Bar M.D.   On: 12/01/2022 05:42   CT Hip Right Wo Contrast  Result Date: 12/01/2022 CLINICAL DATA:  67 year old  female with history of trauma from a fall. Suspected right hip fracture. EXAM: CT OF THE RIGHT HIP WITHOUT CONTRAST TECHNIQUE: Multidetector CT imaging of the right hip was performed according to the standard protocol. Multiplanar CT image reconstructions were also generated. RADIATION DOSE REDUCTION: This exam was performed according to the departmental dose-optimization program which includes automated exposure control, adjustment of the mA and/or kV according to patient size and/or use of iterative reconstruction technique.  COMPARISON:  Right hip radiograph 11/27/2022. FINDINGS: Bones/Joint/Cartilage No acute displaced fractures are identified in the visualized portions of the bony pelvis. Femoral head is located in the acetabulum. Severe joint space narrowing, subchondral sclerosis, subchondral cyst formation and osteophyte formation noted in the hip joint, indicative of advanced osteoarthritis. Ligaments Suboptimally assessed by CT. Muscles and Tendons Unremarkable by CT. Soft tissues Small right inguinal hernia containing only fat incidentally noted. IMPRESSION: 1. No acute radiographic abnormality of the right hip. 2. Small right inguinal hernia containing only fat. 3. 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 Electronically Signed   By: Trudie Reed M.D.   On: 12/01/2022 05:28   CT Head Wo Contrast  Result Date: 12/01/2022 CLINICAL DATA:  Neck trauma. Dizziness and falls over several months. EXAM: CT HEAD WITHOUT CONTRAST CT CERVICAL SPINE WITHOUT CONTRAST TECHNIQUE: Multidetector CT imaging of the head and cervical spine was performed following the standard protocol without intravenous contrast. Multiplanar CT image reconstructions of the cervical spine were also generated. RADIATION DOSE REDUCTION: This exam was performed according to the departmental dose-optimization program which includes automated exposure control, adjustment of the mA and/or kV according to patient size  and/or use of iterative reconstruction technique. COMPARISON:  11/21/2022 FINDINGS: CT HEAD FINDINGS Brain: No evidence of acute infarction, hemorrhage, hydrocephalus, extra-axial collection or mass lesion/mass effect. Vascular: No hyperdense vessel or unexpected calcification. Skull: Normal. Negative for fracture or focal lesion. Sinuses/Orbits: No evidence of injury CT CERVICAL SPINE FINDINGS Alignment: Straightening of cervical lordosis. Skull base and vertebrae: No acute fracture. ACDF and posterior cerclage at C5-C7 with solid arthrodesis. Soft tissues and spinal canal: No prevertebral fluid or swelling. No visible canal hematoma. Disc levels: Disc collapse with endplate ridging at C4-5. Degenerative facet spurring especially at C2-3 to C4-5. Disc collapse with mild uncovertebral ridging at C7-T1. Upper chest: No visible injury IMPRESSION: No evidence of acute intracranial or cervical spine injury. Electronically Signed   By: Tiburcio Pea M.D.   On: 12/01/2022 04:37   CT Cervical Spine Wo Contrast  Result Date: 12/01/2022 CLINICAL DATA:  Neck trauma. Dizziness and falls over several months. EXAM: CT HEAD WITHOUT CONTRAST CT CERVICAL SPINE WITHOUT CONTRAST TECHNIQUE: Multidetector CT imaging of the head and cervical spine was performed following the standard protocol without intravenous contrast. Multiplanar CT image reconstructions of the cervical spine were also generated. RADIATION DOSE REDUCTION: This exam was performed according to the departmental dose-optimization program which includes automated exposure control, adjustment of the mA and/or kV according to patient size and/or use of iterative reconstruction technique. COMPARISON:  11/21/2022 FINDINGS: CT HEAD FINDINGS Brain: No evidence of acute infarction, hemorrhage, hydrocephalus, extra-axial collection or mass lesion/mass effect. Vascular: No hyperdense vessel or unexpected calcification. Skull: Normal. Negative for fracture or focal lesion.  Sinuses/Orbits: No evidence of injury CT CERVICAL SPINE FINDINGS Alignment: Straightening of cervical lordosis. Skull base and vertebrae: No acute fracture. ACDF and posterior cerclage at C5-C7 with solid arthrodesis. Soft tissues and spinal canal: No prevertebral fluid or swelling. No visible canal hematoma. Disc levels: Disc collapse with endplate ridging at C4-5. Degenerative facet spurring especially at C2-3 to C4-5. Disc collapse with mild uncovertebral ridging at C7-T1. Upper chest: No visible injury IMPRESSION: No evidence of acute intracranial or cervical spine injury. Electronically Signed   By: Tiburcio Pea M.D.   On: 12/01/2022 04:37   DG Humerus Left  Result Date: 12/01/2022 CLINICAL DATA:  Fall, shoulder pain EXAM: LEFT HUMERUS - 2+ VIEW COMPARISON:  12/01/2022  FINDINGS: There is no evidence of fracture or other focal bone lesions. Soft tissues are unremarkable. IMPRESSION: Negative. Electronically Signed   By: Charlett Nose M.D.   On: 12/01/2022 02:27   DG Chest 2 View  Result Date: 12/01/2022 CLINICAL DATA:  Fall, hit shoulder on wall EXAM: CHEST - 2 VIEW COMPARISON:  02/02/2018 FINDINGS: Cardiomegaly, vascular congestion. Low lung volumes with bibasilar opacities, favor atelectasis. No effusions or pneumothorax. No acute bony abnormality. IMPRESSION: Cardiomegaly, vascular congestion. Low lung volumes. Bibasilar opacities, likely atelectasis. Electronically Signed   By: Charlett Nose M.D.   On: 12/01/2022 02:03   DG Humerus Right  Result Date: 12/01/2022 CLINICAL DATA:  Fall, dizziness EXAM: RIGHT HUMERUS - 2+ VIEW COMPARISON:  None Available. FINDINGS: Prior right shoulder replacement. No acute bony abnormality. Specifically, no fracture, subluxation, or dislocation. No hardware complicating feature. IMPRESSION: Right shoulder replacement.  No acute bony abnormality. Electronically Signed   By: Charlett Nose M.D.   On: 12/01/2022 02:02   DG Shoulder Left  Result Date: 12/01/2022 CLINICAL  DATA:  Fall, hit shoulder on wall. EXAM: LEFT SHOULDER - 2+ VIEW COMPARISON:  None. FINDINGS: Degenerative changes in the glenohumeral joint with joint space narrowing and spurring. Sclerotic appearance within the humeral neck region with double density over the medial humeral neck. This is felt to be related to spurs rather than fracture. AC joint is intact. IMPRESSION: Sclerosis hand increased density a with the medial humeral neck. This is felt most likely to be related to degenerative changes and spurring rather than fracture. If there is high clinical suspicion for fracture, CT may be beneficial for further evaluation. Electronically Signed   By: Charlett Nose M.D.   On: 12/01/2022 02:01    Procedures Procedures    Medications Ordered in ED Medications - No data to display  ED Course/ Medical Decision Making/ A&P                                 Medical Decision Making Amount and/or Complexity of Data Reviewed Independent Historian: EMS Labs: ordered. Decision-making details documented in ED Course. Radiology: ordered and independent interpretation performed. Decision-making details documented in ED Course. ECG/medicine tests: ordered and independent interpretation performed. Decision-making details documented in ED Course.  Risk Prescription drug management.   Patient with chronic right hip pain here with several falls in the past few days.  States today she hit her left head and shoulder when she fell and but did not lose consciousness.  On arrival she is awake and alert and answering questions appropriately though she is aggressive and belligerent with staff. No preceding dizziness or lightheadedness. No syncope.   No gross deformities noted on exam. EKG is sinus rhythm without Brugada or prolonged QT.  Patient reports pain to her head and shoulder after falling today.  Also reports chronic right hip pain which is unchanged from her baseline.  Recently taken off opiates secondary  to overdose last month.  Patient belligerent and aggressive to staff with erratic behavior and throwing herself on the floor.  She is threatening staff stating "if you do not give me something for pain I will be slamming my head against the door". Discussed with patient that this behavior will not be tolerated.  She is given a dose of hydrocodone by mouth.  NUrsing staff found her to be opening a bag of random pills in her purse.  These include Xanax and trazodone.  Traumatic imaging is reassuring.  CT head and C-spine are negative for acute traumatic pathology.  Results reviewed and interpreted by me.  Questionable humeral fracture not appreciated on CT scan.  Right hip CT is stable without evidence of fracture.  Does show known severe arthritis.  Patient with manipulative and aggressive behavior throughout ED course.  She did require chemical sedation with Haldol for her own safety.  She denies feeling suicidal or homicidal.  No indication for hospital admission.  On recheck, patient is calmer. She is tolerating PO and ambulatory. Discussed reassuring workup today and need for followup with PCP and orthopedic specialist.  She is requesting "something for sleep because if you don't sleep you cannot reboot". D/w patient she is already prescribed several medications for sleep including zolpidem, xanax, trazodone. Unsafe to prescribe further sedating medications and she needs to followup with her doctor for medication adjustments. Her multiple medications are likely contributing to her recurrent falls.         Final Clinical Impression(s) / ED Diagnoses Final diagnoses:  Fall, initial encounter  Multiple contusions    Rx / DC Orders ED Discharge Orders     None         Tanika Bracco, Jeannett Senior, MD 12/01/22 [id number]

## 2022-12-01 NOTE — ED Notes (Signed)
When attempting to administer the Pt ordered hydrocodone, Pt became upset and yelled at staff "is that all you have for me" Pt was initially stating that she did not want to take that medication and that she wanted something stronger. Pt eventually agreed to take medication that was ordered.

## 2022-12-04 DIAGNOSIS — R6889 Other general symptoms and signs: Secondary | ICD-10-CM | POA: Diagnosis not present

## 2022-12-05 DIAGNOSIS — R6889 Other general symptoms and signs: Secondary | ICD-10-CM | POA: Diagnosis not present

## 2022-12-06 DIAGNOSIS — R6889 Other general symptoms and signs: Secondary | ICD-10-CM | POA: Diagnosis not present

## 2022-12-07 DIAGNOSIS — R6889 Other general symptoms and signs: Secondary | ICD-10-CM | POA: Diagnosis not present

## 2022-12-08 DIAGNOSIS — R6889 Other general symptoms and signs: Secondary | ICD-10-CM | POA: Diagnosis not present

## 2022-12-11 DIAGNOSIS — M25551 Pain in right hip: Secondary | ICD-10-CM | POA: Diagnosis not present

## 2022-12-11 DIAGNOSIS — Z981 Arthrodesis status: Secondary | ICD-10-CM | POA: Diagnosis not present

## 2022-12-11 DIAGNOSIS — M160 Bilateral primary osteoarthritis of hip: Secondary | ICD-10-CM | POA: Diagnosis not present

## 2022-12-11 DIAGNOSIS — Z736 Limitation of activities due to disability: Secondary | ICD-10-CM | POA: Diagnosis not present

## 2022-12-11 DIAGNOSIS — M25552 Pain in left hip: Secondary | ICD-10-CM | POA: Diagnosis not present

## 2022-12-11 DIAGNOSIS — R2689 Other abnormalities of gait and mobility: Secondary | ICD-10-CM | POA: Diagnosis not present

## 2022-12-11 DIAGNOSIS — M069 Rheumatoid arthritis, unspecified: Secondary | ICD-10-CM | POA: Diagnosis not present

## 2022-12-13 ENCOUNTER — Ambulatory Visit: Payer: Self-pay | Admitting: *Deleted

## 2022-12-13 NOTE — Patient Outreach (Unsigned)
Care Coordination   Follow Up Visit Note   12/13/2022 Name: Kara [name] MRN: [id number] DOB: January 04, 1956  Kara [name] is a 67 y.o. year old female who sees Irena Reichmann, [place] for primary care. I spoke with  [name] by phone today.  What matters to the patients health and wellness today?  Pt has been terminated by PCP office due to no shows. This CSW attempted follow up call to pt and pt became upset and hung up.   Pt no longer affiliated with PCP practice and CSW will sign off as well.    Goals Addressed             This Visit's Progress    COMPLETED: Need to get hip replacement done       Activities and task to complete in order to accomplish goals.   Continue with your bi-weekly counseling appointments with Albin Felling and discuss your specific needs as discussed today Start / continue relaxed breathing 3 times daily Keep all upcoming appointment discussed today Continue with compliance of taking medication prescribed by Doctor Self Support options  (call [phone] for any urgent mental health support needs Call 911 for emergent needs         SDOH assessments and interventions completed:  Yes{THN Tip this will not be part of the note when signed-REQUIRED REPORT FIELD DO NOT DELETE (Optional):27901}     Care Coordination Interventions:  Yes, provided {THN Tip this will not be part of the note when signed-REQUIRED REPORT FIELD DO NOT DELETE (Optional):27901} Interventions Today    Flowsheet Row Most Recent Value  Chronic Disease   Chronic disease during today's visit Other  [osteoarthritis]  General Interventions   General Interventions Discussed/Reviewed Doctor Visits, Level of Care  Doctor Visits Discussed/Reviewed Doctor Visits Discussed  [Pt reports she is currently in [place], [place]- "My husband to took me to airport and the airline staff helped me..... then got an Uber to [employer].... looking for a Dr to do my hip surgery- I can function and take care of myself anymore".]   Communication with PCP/Specialists  [Per Dr Thomasena Edis nurse, [facility], pt has been deactivated from their practice due to "no shows"]  Mental Health Interventions   Mental Health Discussed/Reviewed Mental Health Discussed, Coping Strategies  [Pt asking CSW, what I can do to help her if she returns to [facility]- regarding her hip and need for surgery- she hung up on CSW]       Follow up plan: No further intervention required.   Encounter Outcome:  Patient Visit Completed {THN Tip this will not be part of the note when signed-REQUIRED REPORT FIELD DO NOT DELETE (Optional):27901}

## 2022-12-15 DIAGNOSIS — R6889 Other general symptoms and signs: Secondary | ICD-10-CM | POA: Diagnosis not present

## 2022-12-16 DIAGNOSIS — R6889 Other general symptoms and signs: Secondary | ICD-10-CM | POA: Diagnosis not present

## 2022-12-17 DIAGNOSIS — R6889 Other general symptoms and signs: Secondary | ICD-10-CM | POA: Diagnosis not present

## 2022-12-18 DIAGNOSIS — R6889 Other general symptoms and signs: Secondary | ICD-10-CM | POA: Diagnosis not present

## 2022-12-19 DIAGNOSIS — R6889 Other general symptoms and signs: Secondary | ICD-10-CM | POA: Diagnosis not present

## 2022-12-20 DIAGNOSIS — R6889 Other general symptoms and signs: Secondary | ICD-10-CM | POA: Diagnosis not present

## 2022-12-21 DIAGNOSIS — M542 Cervicalgia: Secondary | ICD-10-CM | POA: Diagnosis not present

## 2022-12-21 DIAGNOSIS — R6889 Other general symptoms and signs: Secondary | ICD-10-CM | POA: Diagnosis not present

## 2022-12-21 DIAGNOSIS — M25551 Pain in right hip: Secondary | ICD-10-CM | POA: Diagnosis not present

## 2022-12-22 DIAGNOSIS — R6889 Other general symptoms and signs: Secondary | ICD-10-CM | POA: Diagnosis not present

## 2022-12-25 DIAGNOSIS — R6889 Other general symptoms and signs: Secondary | ICD-10-CM | POA: Diagnosis not present

## 2022-12-26 DIAGNOSIS — R6889 Other general symptoms and signs: Secondary | ICD-10-CM | POA: Diagnosis not present

## 2022-12-27 DIAGNOSIS — R6889 Other general symptoms and signs: Secondary | ICD-10-CM | POA: Diagnosis not present

## 2022-12-28 DIAGNOSIS — R6889 Other general symptoms and signs: Secondary | ICD-10-CM | POA: Diagnosis not present

## 2022-12-29 DIAGNOSIS — Z743 Need for continuous supervision: Secondary | ICD-10-CM | POA: Diagnosis not present

## 2022-12-29 DIAGNOSIS — M47816 Spondylosis without myelopathy or radiculopathy, lumbar region: Secondary | ICD-10-CM | POA: Diagnosis not present

## 2022-12-29 DIAGNOSIS — S32592A Other specified fracture of left pubis, initial encounter for closed fracture: Secondary | ICD-10-CM | POA: Diagnosis not present

## 2022-12-29 DIAGNOSIS — R1084 Generalized abdominal pain: Secondary | ICD-10-CM | POA: Diagnosis not present

## 2022-12-29 DIAGNOSIS — T1490XA Injury, unspecified, initial encounter: Secondary | ICD-10-CM | POA: Diagnosis not present

## 2022-12-29 DIAGNOSIS — R6889 Other general symptoms and signs: Secondary | ICD-10-CM | POA: Diagnosis not present

## 2022-12-31 DIAGNOSIS — R6889 Other general symptoms and signs: Secondary | ICD-10-CM | POA: Diagnosis not present

## 2023-01-02 DIAGNOSIS — R6889 Other general symptoms and signs: Secondary | ICD-10-CM | POA: Diagnosis not present

## 2023-01-03 DIAGNOSIS — M25552 Pain in left hip: Secondary | ICD-10-CM | POA: Diagnosis not present

## 2023-01-03 DIAGNOSIS — M25562 Pain in left knee: Secondary | ICD-10-CM | POA: Diagnosis not present

## 2023-01-03 DIAGNOSIS — R6889 Other general symptoms and signs: Secondary | ICD-10-CM | POA: Diagnosis not present

## 2023-01-04 DIAGNOSIS — R6889 Other general symptoms and signs: Secondary | ICD-10-CM | POA: Diagnosis not present

## 2023-01-06 DIAGNOSIS — R6889 Other general symptoms and signs: Secondary | ICD-10-CM | POA: Diagnosis not present

## 2023-01-08 DIAGNOSIS — R6889 Other general symptoms and signs: Secondary | ICD-10-CM | POA: Diagnosis not present

## 2023-01-09 DIAGNOSIS — R6889 Other general symptoms and signs: Secondary | ICD-10-CM | POA: Diagnosis not present

## 2023-01-10 DIAGNOSIS — R6889 Other general symptoms and signs: Secondary | ICD-10-CM | POA: Diagnosis not present

## 2023-01-11 DIAGNOSIS — R6889 Other general symptoms and signs: Secondary | ICD-10-CM | POA: Diagnosis not present

## 2023-01-12 DIAGNOSIS — R6889 Other general symptoms and signs: Secondary | ICD-10-CM | POA: Diagnosis not present

## 2023-01-16 DIAGNOSIS — R6889 Other general symptoms and signs: Secondary | ICD-10-CM | POA: Diagnosis not present

## 2023-01-17 DIAGNOSIS — R6889 Other general symptoms and signs: Secondary | ICD-10-CM | POA: Diagnosis not present

## 2023-01-20 DIAGNOSIS — R6889 Other general symptoms and signs: Secondary | ICD-10-CM | POA: Diagnosis not present

## 2023-01-22 DIAGNOSIS — R6889 Other general symptoms and signs: Secondary | ICD-10-CM | POA: Diagnosis not present

## 2023-01-23 DIAGNOSIS — R6889 Other general symptoms and signs: Secondary | ICD-10-CM | POA: Diagnosis not present

## 2023-01-24 DIAGNOSIS — M25561 Pain in right knee: Secondary | ICD-10-CM | POA: Diagnosis not present

## 2023-01-24 DIAGNOSIS — Z0389 Encounter for observation for other suspected diseases and conditions ruled out: Secondary | ICD-10-CM | POA: Diagnosis not present

## 2023-01-24 DIAGNOSIS — N390 Urinary tract infection, site not specified: Secondary | ICD-10-CM | POA: Diagnosis not present

## 2023-01-24 DIAGNOSIS — M1990 Unspecified osteoarthritis, unspecified site: Secondary | ICD-10-CM | POA: Diagnosis not present

## 2023-01-24 DIAGNOSIS — R6889 Other general symptoms and signs: Secondary | ICD-10-CM | POA: Diagnosis not present

## 2023-01-24 DIAGNOSIS — R52 Pain, unspecified: Secondary | ICD-10-CM | POA: Diagnosis not present

## 2023-01-24 DIAGNOSIS — Z96649 Presence of unspecified artificial hip joint: Secondary | ICD-10-CM | POA: Diagnosis not present

## 2023-01-24 DIAGNOSIS — M1611 Unilateral primary osteoarthritis, right hip: Secondary | ICD-10-CM | POA: Diagnosis not present

## 2023-01-24 DIAGNOSIS — Z91041 Radiographic dye allergy status: Secondary | ICD-10-CM | POA: Diagnosis not present

## 2023-01-24 DIAGNOSIS — G8929 Other chronic pain: Secondary | ICD-10-CM | POA: Diagnosis not present

## 2023-01-24 DIAGNOSIS — M25552 Pain in left hip: Secondary | ICD-10-CM | POA: Diagnosis not present

## 2023-01-24 DIAGNOSIS — M479 Spondylosis, unspecified: Secondary | ICD-10-CM | POA: Diagnosis not present

## 2023-01-24 DIAGNOSIS — M1711 Unilateral primary osteoarthritis, right knee: Secondary | ICD-10-CM | POA: Diagnosis not present

## 2023-01-24 DIAGNOSIS — M25562 Pain in left knee: Secondary | ICD-10-CM | POA: Diagnosis not present

## 2023-01-24 DIAGNOSIS — I10 Essential (primary) hypertension: Secondary | ICD-10-CM | POA: Diagnosis not present

## 2023-01-24 DIAGNOSIS — M25551 Pain in right hip: Secondary | ICD-10-CM | POA: Diagnosis not present

## 2023-01-24 DIAGNOSIS — M160 Bilateral primary osteoarthritis of hip: Secondary | ICD-10-CM | POA: Diagnosis not present

## 2023-01-24 DIAGNOSIS — Z743 Need for continuous supervision: Secondary | ICD-10-CM | POA: Diagnosis not present

## 2023-01-24 DIAGNOSIS — M769 Unspecified enthesopathy, lower limb, excluding foot: Secondary | ICD-10-CM | POA: Diagnosis not present

## 2023-01-25 DIAGNOSIS — R6889 Other general symptoms and signs: Secondary | ICD-10-CM | POA: Diagnosis not present

## 2023-02-01 ENCOUNTER — Ambulatory Visit: Payer: Medicare Other | Admitting: Internal Medicine

## 2023-02-16 ENCOUNTER — Emergency Department (HOSPITAL_COMMUNITY): Payer: Medicare Other

## 2023-02-16 ENCOUNTER — Other Ambulatory Visit: Payer: Self-pay

## 2023-02-16 ENCOUNTER — Encounter (HOSPITAL_COMMUNITY): Payer: Self-pay

## 2023-02-16 ENCOUNTER — Emergency Department (HOSPITAL_COMMUNITY)
Admission: EM | Admit: 2023-02-16 | Discharge: 2023-02-16 | Disposition: A | Discharge status: Home / Self Care | Payer: Medicare Other | Attending: Emergency Medicine | Admitting: Emergency Medicine

## 2023-02-16 DIAGNOSIS — M25551 Pain in right hip: Secondary | ICD-10-CM | POA: Diagnosis present

## 2023-02-16 DIAGNOSIS — I10 Essential (primary) hypertension: Secondary | ICD-10-CM | POA: Diagnosis not present

## 2023-02-16 DIAGNOSIS — Z79899 Other long term (current) drug therapy: Secondary | ICD-10-CM | POA: Diagnosis not present

## 2023-02-16 DIAGNOSIS — M25511 Pain in right shoulder: Secondary | ICD-10-CM

## 2023-02-16 MED ORDER — ACETAMINOPHEN 500 MG PO TABS
1000.0000 mg | ORAL_TABLET | Freq: Once | ORAL | Status: DC
  Filled 2023-02-16: qty 2

## 2023-02-16 MED ORDER — LIDOCAINE 5 % EX PTCH
1.0000 | MEDICATED_PATCH | Freq: Once | CUTANEOUS | Status: DC
  Administered 2023-02-16: 3 via TRANSDERMAL
  Filled 2023-02-16: qty 3

## 2023-02-16 NOTE — ED Triage Notes (Addendum)
Patient BIB EMS for R hip and R shoulder. Patient reports she fell last week and pain has progressively gotten worse. Patient states she is on a "wait list" for a hip replacement for the last 7 months.   Patient states she does not trust doctors. Does not want pain medications or labs drawn.

## 2023-02-16 NOTE — ED Notes (Signed)
Patient c/o lightheadedness. Jacquenette Shone, DO made aware.

## 2023-02-16 NOTE — Discharge Instructions (Signed)
You were seen in the emergency department for your shoulder pain and your hip pain.  Your x-rays showed no new broken bones and your hardware in your shoulder appears to be in the appropriate position.  You can continue to take Tylenol every 6 hours as needed for pain as well as ice or heat and your home meloxicam.  You should follow-up with your primary doctor and with orthopedics and pain management as planned.  You should return to the emergency department if you have significantly worsening pain, you are unable to walk or if you have any other new or concerning symptoms.

## 2023-02-16 NOTE — ED Provider Notes (Signed)
Kara [name]   CSN: [id number] Arrival date & time: 02/16/23  1103     History  Chief Complaint  Patient presents with   Hip Pain    Kara [name] is a 67 y.o. female.  Patient is a 67 year old female with a past medical history of RA, osteopenia, hypertension, depression, substance use presenting to the emergency department with right shoulder and right hip pain.  The patient states that she had her shoulder replaced several years ago however reports that she is had frequent falls due to her right hip.  She states that she most recently fell last night and has had increased pain in her right shoulder and hip since then.  She states that she has been told that she needs a right hip replacement but "is on a 73-month long wait list".  She states that she was unable to get out of bed this morning due to the pain which caused her to call 911.  She denies lightheadedness or dizziness prior to her fall and denies hitting her head or losing consciousness.  She states that she occasionally takes Tylenol for pain at home with minimal relief.  She states that she has been seeing her primary doctor who gave her a referral to pain management but she has not yet established.  The history is provided by the patient.  Hip Pain       Home Medications Prior to Admission medications   Medication Sig Start Date End Date Taking? Authorizing Provider  ALPRAZolam Prudy Feeler) 1 MG tablet Take 1 mg by mouth daily as needed for anxiety.  10/07/19   [provider]  amphetamine-dextroamphetamine (ADDERALL XR) 30 MG 24 hr capsule Take 60 mg by mouth every morning. 09/30/19   [provider]  amphetamine-dextroamphetamine (ADDERALL) 30 MG tablet Take 30 mg by mouth every evening. 09/30/19   [provider]  carbamazepine (TEGRETOL) 200 MG tablet Take 1 tablet (200 mg total) by mouth 3 (three) times daily. Patient not taking: Reported on  10/08/2019 12/20/18   Malvin Johns, MD  cyclobenzaprine (FLEXERIL) 10 MG tablet Take 10 mg by mouth 2 (two) times daily as needed for muscle spasms.  09/25/19   [provider]  lisinopril (PRINIVIL,ZESTRIL) 10 MG tablet TAKE 1 AND 1/2 TABLETS DAILY BY MOUTH Patient taking differently: Take 15 mg by mouth daily. 03/07/18   Kuneff, Renee A, DO  meloxicam (MOBIC) 15 MG tablet TAKE 1 TABLET (15 MG) BY MOUTH EVERY DAY Patient taking differently: Take 15 mg by mouth daily. 07/06/17   Kerrin Champagne, MD  QUEtiapine (SEROQUEL) 25 MG tablet Take 25 mg by mouth at bedtime.    [provider]  traZODone (DESYREL) 50 MG tablet Take 50 mg by mouth at bedtime.    [provider]  tretinoin (RETIN-A) 0.1 % cream Apply 1 application topically at bedtime as needed (facial blemishes).  01/28/18   [provider]  valACYclovir (VALTREX) 1000 MG tablet Take 2 tablets (2,000 mg total) by mouth 2 (two) times daily. Patient taking differently: Take 2,000 mg by mouth daily as needed (for outbreaks). 07/21/18   Ofilia Neas, PA-C  venlafaxine XR (EFFEXOR-XR) 75 MG 24 hr capsule Take 75 mg by mouth daily with breakfast.    [provider]      Allergies    Aspirin, Bee venom, Ibuprofen, Ativan [lorazepam], Ketamine, Toradol [ketorolac tromethamine], and Tramadol hcl    Review of Systems  Review of Systems  Physical Exam Updated Vital Signs BP 103/73   Pulse 88   Temp (!) 97.5 F (36.4 C) (Oral)   Resp 16   Ht 5\' 4"  (1.626 m)   Wt 73 kg   SpO2 96%   BMI 27.62 kg/m  Physical Exam Vitals and nursing [name] reviewed.  Constitutional:      General: She is not in acute distress.    Appearance: Normal appearance.  HENT:     Head: Normocephalic and atraumatic.     Nose: Nose normal.     Mouth/Throat:     Mouth: Mucous membranes are moist.  Eyes:     Extraocular Movements: Extraocular movements intact.     Conjunctiva/sclera: Conjunctivae normal.  Neck:      Comments: No midline neck tenderness Cardiovascular:     Rate and Rhythm: Normal rate and regular rhythm.     Pulses: Normal pulses.     Heart sounds: Normal heart sounds.  Pulmonary:     Effort: Pulmonary effort is normal.     Breath sounds: Normal breath sounds.  Abdominal:     General: Abdomen is flat.     Palpations: Abdomen is soft.     Tenderness: There is no abdominal tenderness.  Musculoskeletal:     Cervical back: Normal range of motion and neck supple.     Comments: No midline back tenderness, no bony tenderness to left upper or left lower extremity Tenderness to palpation of right lateral shoulder, no palpable deformity, no tenderness to right elbow or wrist Tenderness to palpation of right hip with increased pain with internal/external rotation, no tenderness to right knee or ankle  Skin:    General: Skin is warm and dry.     Findings: No bruising.  Neurological:     General: No focal deficit present.     Mental Status: She is alert and oriented to person, place, and time.     Sensory: No sensory deficit.     Motor: No weakness.  Psychiatric:        Mood and Affect: Mood normal.        Behavior: Behavior normal.     ED Results / Procedures / Treatments   Labs (all labs ordered are listed, but only abnormal results are displayed) Labs Reviewed - No data to display  EKG None  Radiology DG Femur 1V Right  Result Date: 02/16/2023 CLINICAL DATA:  Right leg pain. EXAM: RIGHT FEMUR 1 VIEW COMPARISON:  None Available. FINDINGS: No acute fracture.  No bony lesions or soft tissue abnormalities. IMPRESSION: Negative. Electronically Signed   By: Irish Lack M.D.   On: 02/16/2023 13:47   DG Hip Unilat With Pelvis 2-3 Views Right  Result Date: 02/16/2023 CLINICAL DATA:  Fall 1 week ago and progressive right hip pain. EXAM: DG HIP (WITH OR WITHOUT PELVIS) 2-3V RIGHT COMPARISON:  11/27/2022 FINDINGS: No acute fracture or dislocation identified. Severe osteoarthritis of  the right hip joint again noted with significant superior joint space loss and some mild flattening of the articular surface of the superior femoral head which may be consistent with a component of early osteonecrosis. Subchondral sclerosis in the femoral head may be slightly more prominent compared to the prior radiographs but overall appearance osteoarthritis is fairly stable. No pelvic fracture or diastasis. No bony lesions identified. Stable visualized lower lumbar spine fusion hardware. IMPRESSION: 1. No acute fracture or dislocation of the right hip. 2. Severe osteoarthritis of the right hip joint with some  mild flattening of the articular surface of the superior femoral head which may be consistent with a component of early osteonecrosis. Electronically Signed   By: Irish Lack M.D.   On: 02/16/2023 13:37   DG Shoulder Right  Result Date: 02/16/2023 CLINICAL DATA:  Fall last week with right shoulder pain. EXAM: RIGHT SHOULDER - 2+ VIEW COMPARISON:  12/01/2022 FINDINGS: No acute fracture or dislocation identified. Stable appearance right shoulder hemiarthroplasty. No abnormal lucency surrounding hardware. No bony lesions. Visualized soft tissues are unremarkable. IMPRESSION: No acute findings. Stable appearance of right shoulder hemiarthroplasty. Electronically Signed   By: Irish Lack M.D.   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 [VK]    Clinical Course User Index [VK] Rexford Maus, DO                                 Medical Decision  Making This patient presents to the ED with chief complaint(s) of right shoulder pain, right hip pain with pertinent past medical history of RA, depression, hypertension, osteopenia which further complicates the presenting complaint. The complaint involves an extensive differential diagnosis and also carries with it a high risk of complications and morbidity.    The differential diagnosis includes fracture, dislocation, sprain, disruption of hardware, contusion, no other traumatic injury seen on exam, no evidence of neurovascular injury, no presyncopal symptoms making syncopal fall unlikely  Additional history obtained: Additional history obtained from N/A Records reviewed previous admission documents, Care Everywhere/External Records, and outpatient orthopedic records  ED Course and Reassessment: On patient's arrival she is hemodynamically stable in no acute distress and neurovascularly intact.  Patient will have x-rays of her right hip and femur and right shoulder performed.  She declined any pain control prior to imaging and will be closely reassessed.  Independent labs interpretation:  N/A  Independent visualization of imaging: - I independently visualized the following imaging with scope of interpretation limited to determining acute life threatening conditions related to emergency care: Right shoulder x-ray, right hip/femur x-ray, which revealed no acute traumatic injury  Consultation: - Consulted or discussed management/test interpretation w/ external professional: N/A  Consideration for admission or further workup: Patient has no emergent conditions requiring admission or further work-up at this time and is stable for discharge home with primary care follow-up  Social Determinants of health: N/A    Amount and/or Complexity of Data Reviewed Radiology: ordered.  Risk OTC drugs. Prescription drug management.          Final Clinical Impression(s) / ED Diagnoses Final  diagnoses:  Pain of right hip  Acute pain of right shoulder    Rx / DC Orders ED Discharge Orders     None         Rexford Maus, DO 02/16/23 1419

## 2023-03-12 ENCOUNTER — Encounter: Payer: Self-pay | Admitting: Physical Medicine & Rehabilitation

## 2023-03-16 ENCOUNTER — Encounter: Payer: Self-pay | Admitting: Physical Medicine & Rehabilitation

## 2023-03-16 ENCOUNTER — Encounter: Payer: Medicare Other | Attending: Physical Medicine & Rehabilitation | Admitting: Physical Medicine & Rehabilitation

## 2023-03-16 VITALS — BP 137/90 | HR 95 | Ht 64.0 in | Wt 134.0 lb

## 2023-03-16 DIAGNOSIS — Z79899 Other long term (current) drug therapy: Secondary | ICD-10-CM

## 2023-03-16 DIAGNOSIS — G894 Chronic pain syndrome: Secondary | ICD-10-CM | POA: Diagnosis not present

## 2023-03-16 DIAGNOSIS — Z5181 Encounter for therapeutic drug level monitoring: Secondary | ICD-10-CM

## 2023-03-16 MED ORDER — DULOXETINE HCL 20 MG PO CPEP: 20.0000 mg | ORAL_CAPSULE | Freq: Every day | ORAL | 1 refills | Status: DC

## 2023-03-16 NOTE — Progress Notes (Signed)
Subjective:    Patient ID: Kara [name], female    DOB: June 12, 1955, 67 y.o.   MRN: [id number]  HPI  67 year old female referred by orthopedics to eval for right hip pain. Past medical history significant for opioid use disorder, cannabis use disorder, anxiety, depression, history of suicide attempt, accidental fentanyl overdose as well as rheumatoid arthritis.  In addition to her right hip pain she complains of left wrist pain. Past surgical history status post L3-S1 fusion. She complains of 10 out of 10 pain primarily in her groin area worse with walking standing improves with rest and heat walking tolerance is 5 minutes using a walker.  She does not climb steps she does drive.  She has been on disability since 2006.  She needs help with dressing bathing meal prep household duties.  She does no exercise on a regular basis.  She does start physical therapy this month. Over-the-counter medication use includes Tylenol.  She does have a prescription for Mobic 15 mg a day which she has not started yet. She sees psychiatry, Dr. Milagros Evener She states that she no longer takes Effexor .  She has an allergy to tramadol, causing itching states she can tolerate hydromorphone. She has a hip surgery scheduled at [facility] in March 2025.  Opioid risk tool score of 13, high  CT OF THE RIGHT HIP WITHOUT CONTRAST   TECHNIQUE: Multidetector CT imaging of the right hip was performed according to the standard protocol. Multiplanar CT image reconstructions were also generated.   RADIATION DOSE REDUCTION: This exam was performed according to the departmental dose-optimization program which includes automated exposure control, adjustment of the mA and/or kV according to patient size and/or use of iterative reconstruction technique.   COMPARISON:  Right hip radiograph 11/27/2022.   FINDINGS: Bones/Joint/Cartilage   No acute displaced fractures are identified in the visualized portions of the  bony pelvis. Femoral head is located in the acetabulum. Severe joint space narrowing, subchondral sclerosis, subchondral cyst formation and osteophyte formation noted in the hip joint, indicative of advanced osteoarthritis.   Ligaments   Suboptimally assessed by CT.   Muscles and Tendons   Unremarkable by CT.   Soft tissues   Small right inguinal hernia containing only fat incidentally noted.   IMPRESSION: 1. No acute radiographic abnormality of the right hip. 2. Small right inguinal hernia containing only fat. 3. 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     Electronically Signed   By: Trudie Reed M.D.   On: 12/01/2022 05:28   Pain Inventory Average Pain 10 Pain Right Now 10 My pain is constant, sharp, and throbbing  In the last 24 hours, has pain interfered with the following? General activity 10 Relation with others 10 Enjoyment of life 10 What TIME of day is your pain at its worst? morning  and night Sleep (in general) Poor  Pain is worse with: walking, bending, sitting, standing, and some activites Pain improves with: rest and medication Relief from Meds: 8  walk with assistance use a walker how many minutes can you walk? 5 ability to climb steps?  no do you drive?  yes  disabled: date disabled 2006 I need assistance with the following:  dressing, bathing, meal prep, household duties, and shopping  weakness trouble walking depression  New pt  New pt    Family History  Problem Relation Age of Onset   Arthritis Mother    Heart disease Mother        ?  psvt   Breast cancer Mother 41       TAH/BSO   Cancer Mother    Mental illness Mother    COPD Father    Hypertension Father    Alcohol abuse Father    Mental illness Father    Heart disease Father    Healthy Daughter    Breast cancer Maternal Aunt 12       deceased   Cancer Cousin 71       female; unknown primary   Colon cancer Paternal Aunt 27        deceased at 32   Stomach cancer Paternal Uncle 78       deceased at 40   Alcohol abuse Brother    Cancer Brother    Hodgkin's lymphoma Brother    Mental illness Brother    HIV Brother    Healthy Brother    Healthy Son    Social History   Socioeconomic History   Marital status: Married    Spouse name: Not on file   Number of children: Not on file   Years of education: Not on file   Highest education level: Not on file  Occupational History   Not on file  Tobacco Use   Smoking status: Former    Current packs/day: 0.00    Average packs/day: 1 pack/day for 10.0 years (10.0 ttl pk-yrs)    Types: Cigarettes    Start date: 12/07/1995    Quit date: 12/06/2005    Years since quitting: 17.2   Smokeless tobacco: Never  Vaping Use   Vaping status: Never Used  Substance and Sexual Activity   Alcohol use: Yes    Comment: social   Drug use: Not Currently    Types: Marijuana    Comment: smoked marjuana yesterday 12/15/2018   Sexual activity: Yes    Birth control/protection: Post-menopausal  Other Topics Concern   Not on file  Social History Narrative   Married, Kara [name].    6 children.    [employer], Retired Child psychotherapist.    Denies alcohol, tobacco or drug use.   Drinks caffeinated beverages. Uses herbal remedies. Take a daily vitamin.   Wears her seatbelt. Exercises greater than 3 times a week.   Smoke detector in the home, firearms in the home in a locked cabinet, feels safe in her relationships.   Social Drivers of Health   Financial Resource Strain: Patient Declined (10/02/2022)   Received from [facility]   Overall Financial Resource Strain (CARDIA)    Difficulty of Paying Living Expenses: Patient declined  Food Insecurity: No Food Insecurity (11/30/2022)   Hunger Vital Sign    Worried About Running Out of Food in the Last Year: Never true    Ran Out of Food in the Last Year: Never true  Transportation Needs: No Transportation Needs (11/30/2022)   PRAPARE - Therapist, art (Medical): No    Lack of Transportation (Non-Medical): No  Physical Activity: Unknown (10/02/2022)   Received from [facility]   Exercise Vital Sign    Days of Exercise per Week: Patient declined    Minutes of Exercise per Session: Not on file  Stress: Stress Concern Present (11/30/2022)   [employer] of Occupational Health - Occupational Stress Questionnaire    Feeling of Stress : Very much  Social Connections: Unknown (07/25/2021)   Received from [facility]   Social Network    Social Network: Not on file   Past  Surgical History:  Procedure Laterality Date   ANTERIOR CERVICAL DECOMP/DISCECTOMY FUSION N/A 03/15/2015   Procedure: Cervical five-six, Cerival six-seven, Anterior Cervical Discectomy and Fusion, Allograft and Plate;  Surgeon: Eldred Manges, MD;  Location: [facility] OR;  Service: Orthopedics;  Laterality: N/A;   ANTERIOR LAT LUMBAR FUSION Left 06/11/2018   Procedure: Left Lumbar two-three Anterolateral decompression/fusion/lateral plate fixation;  Surgeon: Barnett Abu, MD;  Location: [facility] OR;  Service: Neurosurgery;  Laterality: Left;  Left Lumbar two-three Anterolateral decompression/fusion/lateral plate fixation   AUGMENTATION MAMMAPLASTY  2003   BACK SURGERY     BLADDER SUSPENSION  2009   BONE EXCISION Right 11/26/2018   Procedure: TRAPEZIUM EXCISION RIGHT THUMB;  Surgeon: Cindee Salt, MD;  Location: [facility];  Service: Orthopedics;  Laterality: Right;  AXILLARY BLOCK   BREAST IMPLANT REMOVAL Bilateral 10/2013   CARPOMETACARPEL SUSPENSION PLASTY Right 11/26/2018   Procedure: Carolynn Serve SUSPENSION;  Surgeon: Cindee Salt, MD;  Location: [facility];  Service: Orthopedics;  Laterality: Right;   CERVICAL WOUND DEBRIDEMENT N/A 07/07/2016   Procedure: IRRIGATION AND DEBRIDEMENT POSTERIOR NECK;  Surgeon: Eldred Manges, MD;  Location: [facility] OR;  Service: Orthopedics;  Laterality: N/A;   COLONOSCOPY     COMBINED ABDOMINOPLASTY  AND LIPOSUCTION  2003   INCISION AND DRAINAGE ABSCESS Right 01/16/2014   Procedure: INCISION AND DRAINAGE AND OF RIGHT BREAST ABCESS;  Surgeon: Glenna Fellows, MD;  Location: [facility];  Service: General;  Laterality: Right;   JOINT REPLACEMENT     LUMBAR LAMINECTOMY/DECOMPRESSION MICRODISCECTOMY N/A 12/10/2015   Procedure: Right L3-4 Hemilaminectomy, Excision of herniated nucleus pulposus;  Surgeon: Eldred Manges, MD;  Location: [facility] OR;  Service: Orthopedics;  Laterality: N/A;   MASS EXCISION  11/03/2011   Procedure: MINOR EXCISION OF MASS;  Surgeon: Wyn Forster., MD;  Location: [facility];  Service: Orthopedics;  Laterality: Left;  debride IP joint, cyst excision left index   MAXIMUM ACCESS (MAS) TRANSFORAMINAL LUMBAR INTERBODY FUSION (TLIF) 2 LEVEL Right 02/23/2017   POSTERIOR CERVICAL FUSION/FORAMINOTOMY N/A 06/21/2016   Procedure: POSTERIOR CERVICAL FUSION C5-C7 SPINOUS PROCESS WIRING;  Surgeon: Eldred Manges, MD;  Location: [facility];  Service: Orthopedics;  Laterality: N/A;   POSTERIOR LUMBAR FUSION  07/2009; 07/03/2014   "L4-5; L5-S1"   SHOULDER ARTHROSCOPY Right 2012   TENDON TRANSFER Right 11/26/2018   Procedure: TENDON TRANSFER;  Surgeon: Cindee Salt, MD;  Location: [facility];  Service: Orthopedics;  Laterality: Right;   TONSILLECTOMY  1987   TOTAL SHOULDER ARTHROPLASTY  08/01/2011   Procedure: TOTAL SHOULDER ARTHROPLASTY;  Surgeon: Eulas Post, MD;  Location: [facility];  Service: Orthopedics;  Laterality: Right;  Right total shoulder arthroplasty   TUBAL LIGATION  1988   Past Medical History:  Diagnosis Date   ADD (attention deficit disorder)    on Adderal   ADD (attention deficit disorder)    Aggressive behavior of adult    Anemia    Anxiety    Cellulitis of breast 11/2013   RIGHT BREAST   Childhood asthma    as child   Chronic fatigue and immune dysfunction syndrome (HCC)    Chronic lower back pain    Chronic pain    went to [facility] for pain control; stopped in 2016 " (02/23/2017)   Cold sore    Complication of anesthesia    Ketamine makes her hallucinate   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   Post-nasal drip    hx of   Pre-diabetes    patient states she is not Pre-Diabetic at appt. 12/16/2018   PTSD (post-traumatic stress disorder)    RA (rheumatoid arthritis) (HCC)    autoimmune arthritis   RA (rheumatoid arthritis) (HCC)    Spondylitis (HCC)    Suicidal intent    BP (!) 137/90   Pulse 95   Ht 5\' 4"  (1.626 m)   Wt 134 lb (60.8 kg)   SpO2 (!) 87%   BMI 23.00 kg/m   Opioid Risk Score:   Fall Risk Score:  `1  Depression screen [facility] 2/9     03/16/2023    2:37 PM 11/30/2022    1:07 PM 09/18/2017    1:34 PM 04/17/2017    3:14 PM 08/22/2016    2:19 PM 08/18/2015    9:38 AM  Depression screen PHQ 2/9  Decreased Interest 3 3  3 2  0  Down, Depressed, Hopeless 1 3 3 3 2  0  PHQ - 2 Score 4 6 3 6 4  0  Altered sleeping 3 2 2  0 1   Tired, decreased energy 3 3 3 3 2    Change in appetite 3 2 2 2 1    Feeling bad or failure about yourself  0 0  0 2   Trouble concentrating 1 3 3 3 1    Moving slowly or fidgety/restless 0 3  0 1   Suicidal thoughts 0 1 0 0 0   PHQ-9 Score 14 20 13 14 12    Difficult doing work/chores Extremely dIfficult Somewhat difficult  Not difficult at all Somewhat difficult       Review of Systems  Gastrointestinal:  Positive for diarrhea.  Musculoskeletal:  Positive for gait problem.       Left wrist pain Right hip pain Right side pelvic pain down to knee  Neurological:  Positive for weakness.   Psychiatric/Behavioral:  Positive for dysphoric mood.   All other systems reviewed and are negative.     Objective:   Physical Exam Vitals reviewed.  Constitutional:      Appearance: She is normal weight.  HENT:     Head: Normocephalic and atraumatic.  Eyes:     Extraocular Movements: Extraocular movements intact.     Conjunctiva/sclera: Conjunctivae normal.     Pupils: Pupils are equal, round, and reactive to light.  Musculoskeletal:     Comments: There is pain with right hip external rotation there is no right hip internal rotation No pain with knee range of motion no evidence of knee effusion bilaterally left hip has full range of motion without pain no pain with ankle or foot range of motion. No evidence of hypersensitivity to touch in the right lower extremity.  No tenderness over the lateral hip.  Neurological:     Mental Status: She is alert and oriented to person, place, and time.     Cranial Nerves: No facial asymmetry.     Sensory: No sensory deficit.     Motor: Atrophy present. No tremor or abnormal muscle tone.  Coordination: Coordination is intact.     Gait: Gait abnormal.  Psychiatric:        Attention and Perception: Attention and perception normal.        Mood and Affect: Mood is anxious. Affect is tearful.        Speech: Speech normal.        Behavior: Behavior normal. Behavior is cooperative.        Thought Content: Thought content normal.        Cognition and Memory: Cognition and memory normal.           Assessment & Plan:   1.  Right hip pain due to osteoarthritis awaiting surgery.  Patient does have hip contracture and severe pain which limits ambulation.  She has comorbid conditions including depression, anxiety, history of suicidal ideations and does see psychiatry.  In addition she has a history of opioid use disorder and accidental overdose.  Her opioid risk tool score is 13.  She admits to recent fentanyl use states that it was old  prescription for a patch.  PDMP was reviewed no prescription in the last year.  Will check UDS. Would recommend use of nonsteroidal anti-inflammatories as well as trial of duloxetine.  Will also schedule for right hip injection under fluoroscopic guidance.  We discussed that postoperatively her pain would be managed by orthopedic surgery at [facility] and she also may be seen postoperatively at [facility] pain medicine.

## 2023-03-22 ENCOUNTER — Telehealth: Payer: Self-pay | Admitting: Physical Medicine & Rehabilitation

## 2023-03-22 NOTE — Telephone Encounter (Signed)
Please call and advise. Current medication prescribed Cymbalta.

## 2023-03-23 LAB — DRUG TOX MONITOR 1 W/CONF, ORAL FLD
Alprazolam: 1.17 ng/mL — ABNORMAL HIGH (ref <0.50)
Aminoclonazepam: 3.28 ng/mL — ABNORMAL HIGH (ref <0.50)
Amphetamine: >500 ng/mL — ABNORMAL HIGH (ref <10)
Amphetamines: POSITIVE ng/mL — AB (ref <10)
Barbiturates: NEGATIVE ng/mL (ref <10)
Benzodiazepines: POSITIVE ng/mL — AB (ref <0.50)
Buprenorphine: NEGATIVE ng/mL (ref <0.10)
Chlordiazepoxide: NEGATIVE ng/mL (ref <0.50)
Clonazepam: NEGATIVE ng/mL (ref <0.50)
Cocaine: NEGATIVE ng/mL (ref <5.0)
Diazepam: NEGATIVE ng/mL (ref <0.50)
Fentanyl: 2.72 ng/mL — ABNORMAL HIGH (ref <0.10)
Fentanyl: POSITIVE ng/mL — AB (ref <0.10)
Flunitrazepam: NEGATIVE ng/mL (ref <0.50)
Flurazepam: NEGATIVE ng/mL (ref <0.50)
Heroin Metabolite: NEGATIVE ng/mL (ref <1.0)
Lorazepam: NEGATIVE ng/mL (ref <0.50)
MARIJUANA: NEGATIVE ng/mL (ref <2.5)
MDMA: NEGATIVE ng/mL (ref <10)
Meprobamate: NEGATIVE ng/mL (ref <2.5)
Methadone: NEGATIVE ng/mL (ref <5.0)
Methamphetamine: NEGATIVE ng/mL (ref <10)
Midazolam: NEGATIVE ng/mL (ref <0.50)
Nicotine Metabolite: NEGATIVE ng/mL (ref <5.0)
Nordiazepam: NEGATIVE ng/mL (ref <0.50)
Opiates: NEGATIVE ng/mL (ref <2.5)
Oxazepam: NEGATIVE ng/mL (ref <0.50)
Phencyclidine: NEGATIVE ng/mL (ref <10)
Tapentadol: NEGATIVE ng/mL (ref <5.0)
Temazepam: NEGATIVE ng/mL (ref <0.50)
Tramadol: NEGATIVE ng/mL (ref <5.0)
Triazolam: NEGATIVE ng/mL (ref <0.50)
Zolpidem: 5.4 ng/mL — ABNORMAL HIGH (ref <5.0)
Zolpidem: POSITIVE ng/mL — AB (ref <5.0)

## 2023-03-23 LAB — DRUG TOX ALC METAB W/CON, ORAL FLD: Alcohol Metabolite: NEGATIVE ng/mL (ref <25)

## 2023-03-27 NOTE — Telephone Encounter (Signed)
Left message for patient to call back  

## 2023-04-06 ENCOUNTER — Ambulatory Visit (HOSPITAL_COMMUNITY)
Admission: EM | Admit: 2023-04-06 | Discharge: 2023-04-06 | Disposition: A | Discharge status: Home / Self Care | Payer: Medicare Other

## 2023-04-06 NOTE — Progress Notes (Signed)
   04/06/23 1546  [facility] Triage Screening (Walk-ins at [facility] only)  How Did You Hear About Us ? Self  What Is the Reason for Your Visit/Call Today? Pt is 68 yo female who presents to [facility] voluntarily accompanied by her daughter . Pt reports that her daughter moved in with her a month ago and it has been extremely stressful for her. Pt reports that her daughter struggles with mental health issues and it is difficult living with her. Pt reports that she has a hx of PTSD and ADHD. Pt reports that she has a therapist and psychiatrist. Pt denies SI, HI, and AVH. Pt denies alcohol  and drug use. Pt reports that she is seeking resources on how to help her daughter.  How Long Has This Been Causing You Problems? 1 wk - 1 month  Have You Recently Had Any Thoughts About Hurting Yourself? No  Are You Planning to Commit Suicide/Harm Yourself At This time? No  Have you Recently Had Thoughts About Hurting Someone Sherral? No  Are You Planning To Harm Someone At This Time? No  Physical Abuse Denies  Verbal Abuse Denies  Sexual Abuse Denies  Exploitation of patient/patient's resources Denies  Self-Neglect Denies  Possible abuse reported to:  (n/a)  Are you currently experiencing any auditory, visual or other hallucinations? No  Have You Used Any Alcohol  or Drugs in the Past 24 Hours? No  Do you have any current medical co-morbidities that require immediate attention? No  Clinician description of patient physical appearance/behavior: Pt was cooperative but tearful.  What Do You Feel Would Help You the Most Today? Treatment for Depression or other mood problem;Stress Management  If access to [facility] was not available, would you have sought care in the Emergency Department? Yes  Determination of Need Routine (7 days)  Options For Referral Outpatient Therapy   @1500  Pt reported that she would like to leave AMA because her daughter has agreed to live the home.   Flowsheet Row ED from 04/06/2023 in [facility] ED from 02/16/2023 in [facility] Emergency Department at [facility] ED from 12/01/2022 in [facility] Emergency Department at [facility]  C-SSRS RISK CATEGORY No Risk No Risk No Risk

## 2023-04-07 ENCOUNTER — Emergency Department (HOSPITAL_COMMUNITY): Payer: Medicare Other

## 2023-04-07 ENCOUNTER — Inpatient Hospital Stay (HOSPITAL_COMMUNITY): Payer: Medicare Other

## 2023-04-07 ENCOUNTER — Encounter (HOSPITAL_COMMUNITY): Admission: EM | Discharge status: Against Medical Advice | Payer: Self-pay | Source: Home / Self Care

## 2023-04-07 ENCOUNTER — Other Ambulatory Visit: Payer: Self-pay

## 2023-04-07 ENCOUNTER — Encounter (HOSPITAL_COMMUNITY): Payer: Self-pay

## 2023-04-07 ENCOUNTER — Inpatient Hospital Stay (HOSPITAL_COMMUNITY)
Admission: EM | Admit: 2023-04-07 | Discharge: 2023-04-09 | DRG: 358 | Discharge status: Against Medical Advice | Payer: Medicare Other | Attending: General Surgery | Admitting: General Surgery

## 2023-04-07 DIAGNOSIS — R195 Other fecal abnormalities: Secondary | ICD-10-CM | POA: Diagnosis present

## 2023-04-07 DIAGNOSIS — M06811 Other specified rheumatoid arthritis, right shoulder: Secondary | ICD-10-CM | POA: Diagnosis present

## 2023-04-07 DIAGNOSIS — M797 Fibromyalgia: Secondary | ICD-10-CM | POA: Diagnosis present

## 2023-04-07 DIAGNOSIS — Z91030 Bee allergy status: Secondary | ICD-10-CM | POA: Diagnosis not present

## 2023-04-07 DIAGNOSIS — Z9882 Breast implant status: Secondary | ICD-10-CM | POA: Diagnosis not present

## 2023-04-07 DIAGNOSIS — G8929 Other chronic pain: Secondary | ICD-10-CM | POA: Diagnosis present

## 2023-04-07 DIAGNOSIS — Z87442 Personal history of urinary calculi: Secondary | ICD-10-CM

## 2023-04-07 DIAGNOSIS — Z791 Long term (current) use of non-steroidal anti-inflammatories (NSAID): Secondary | ICD-10-CM | POA: Diagnosis not present

## 2023-04-07 DIAGNOSIS — K255 Chronic or unspecified gastric ulcer with perforation: Secondary | ICD-10-CM | POA: Diagnosis not present

## 2023-04-07 DIAGNOSIS — I10 Essential (primary) hypertension: Secondary | ICD-10-CM

## 2023-04-07 DIAGNOSIS — F32A Depression, unspecified: Secondary | ICD-10-CM | POA: Diagnosis present

## 2023-04-07 DIAGNOSIS — Z8249 Family history of ischemic heart disease and other diseases of the circulatory system: Secondary | ICD-10-CM | POA: Diagnosis not present

## 2023-04-07 DIAGNOSIS — K251 Acute gastric ulcer with perforation: Principal | ICD-10-CM | POA: Diagnosis present

## 2023-04-07 DIAGNOSIS — Z886 Allergy status to analgesic agent status: Secondary | ICD-10-CM

## 2023-04-07 DIAGNOSIS — Z981 Arthrodesis status: Secondary | ICD-10-CM

## 2023-04-07 DIAGNOSIS — Z87891 Personal history of nicotine dependence: Secondary | ICD-10-CM

## 2023-04-07 DIAGNOSIS — Z9851 Tubal ligation status: Secondary | ICD-10-CM

## 2023-04-07 DIAGNOSIS — J45909 Unspecified asthma, uncomplicated: Secondary | ICD-10-CM

## 2023-04-07 DIAGNOSIS — F988 Other specified behavioral and emotional disorders with onset usually occurring in childhood and adolescence: Secondary | ICD-10-CM | POA: Diagnosis present

## 2023-04-07 DIAGNOSIS — Z888 Allergy status to other drugs, medicaments and biological substances status: Secondary | ICD-10-CM | POA: Diagnosis not present

## 2023-04-07 DIAGNOSIS — M51369 Other intervertebral disc degeneration, lumbar region without mention of lumbar back pain or lower extremity pain: Secondary | ICD-10-CM | POA: Diagnosis present

## 2023-04-07 DIAGNOSIS — M810 Age-related osteoporosis without current pathological fracture: Secondary | ICD-10-CM | POA: Diagnosis present

## 2023-04-07 DIAGNOSIS — Z818 Family history of other mental and behavioral disorders: Secondary | ICD-10-CM

## 2023-04-07 DIAGNOSIS — Z79899 Other long term (current) drug therapy: Secondary | ICD-10-CM

## 2023-04-07 DIAGNOSIS — K275 Chronic or unspecified peptic ulcer, site unspecified, with perforation: Secondary | ICD-10-CM | POA: Diagnosis present

## 2023-04-07 HISTORY — PX: LAPAROTOMY: SHX154

## 2023-04-07 LAB — COMPREHENSIVE METABOLIC PANEL
ALT: 10 U/L (ref 0–44)
AST: 14 U/L — ABNORMAL LOW (ref 15–41)
Albumin: 3.1 g/dL — ABNORMAL LOW (ref 3.5–5.0)
Alkaline Phosphatase: 69 U/L (ref 38–126)
Anion gap: 8 (ref 5–15)
BUN: 20 mg/dL (ref 8–23)
CO2: 25 mmol/L (ref 22–32)
Calcium: 8.6 mg/dL — ABNORMAL LOW (ref 8.9–10.3)
Chloride: 105 mmol/L (ref 98–111)
Creatinine, Ser: 0.58 mg/dL (ref 0.44–1.00)
GFR, Estimated: >60 mL/min (ref >60)
Glucose, Bld: 95 mg/dL (ref 70–99)
Potassium: 3.7 mmol/L (ref 3.5–5.1)
Sodium: 138 mmol/L (ref 135–145)
Total Bilirubin: 0.3 mg/dL (ref 0.0–1.2)
Total Protein: 5.9 g/dL — ABNORMAL LOW (ref 6.5–8.1)

## 2023-04-07 LAB — CBC WITH DIFFERENTIAL/PLATELET
Abs Immature Granulocytes: 0.03 10*3/uL (ref 0.00–0.07)
Basophils Absolute: 0 10*3/uL (ref 0.0–0.1)
Basophils Relative: 0 %
Eosinophils Absolute: 0.2 10*3/uL (ref 0.0–0.5)
Eosinophils Relative: 2 %
HCT: 26.5 % — ABNORMAL LOW (ref 36.0–46.0)
Hemoglobin: 8.1 g/dL — ABNORMAL LOW (ref 12.0–15.0)
Immature Granulocytes: 0 %
Lymphocytes Relative: 15 %
Lymphs Abs: 1.4 10*3/uL (ref 0.7–4.0)
MCH: 27.8 pg (ref 26.0–34.0)
MCHC: 30.6 g/dL (ref 30.0–36.0)
MCV: 91.1 fL (ref 80.0–100.0)
Monocytes Absolute: 0.4 10*3/uL (ref 0.1–1.0)
Monocytes Relative: 4 %
Neutro Abs: 7 10*3/uL (ref 1.7–7.7)
Neutrophils Relative %: 79 %
Platelets: 408 10*3/uL — ABNORMAL HIGH (ref 150–400)
RBC: 2.91 MIL/uL — ABNORMAL LOW (ref 3.87–5.11)
RDW: 15.5 % (ref 11.5–15.5)
WBC: 9 10*3/uL (ref 4.0–10.5)
nRBC: 0 % (ref 0.0–0.2)

## 2023-04-07 LAB — URINALYSIS, ROUTINE W REFLEX MICROSCOPIC
Bilirubin Urine: NEGATIVE
Glucose, UA: NEGATIVE mg/dL
Hgb urine dipstick: NEGATIVE
Ketones, ur: NEGATIVE mg/dL
Leukocytes,Ua: NEGATIVE
Nitrite: NEGATIVE
Protein, ur: NEGATIVE mg/dL
Specific Gravity, Urine: 1.043 — ABNORMAL HIGH (ref 1.005–1.030)
pH: 5 (ref 5.0–8.0)

## 2023-04-07 LAB — LIPASE, BLOOD: Lipase: 23 U/L (ref 11–51)

## 2023-04-07 LAB — POC OCCULT BLOOD, ED: Fecal Occult Bld: POSITIVE — AB

## 2023-04-07 SURGERY — LAPAROTOMY, EXPLORATORY
Anesthesia: General | Site: Abdomen

## 2023-04-07 MED ORDER — PANTOPRAZOLE SODIUM 40 MG IV SOLR
40.0000 mg | Freq: Once | INTRAVENOUS | Status: AC
  Administered 2023-04-07: 40 mg via INTRAVENOUS
  Filled 2023-04-07: qty 10

## 2023-04-07 MED ORDER — SODIUM CHLORIDE 0.9 % IV SOLN: INTRAVENOUS | Status: DC | PRN

## 2023-04-07 MED ORDER — PROCHLORPERAZINE EDISYLATE 10 MG/2ML IJ SOLN: 5.0000 mg | Freq: Four times a day (QID) | INTRAMUSCULAR | Status: DC | PRN

## 2023-04-07 MED ORDER — ACETAMINOPHEN 10 MG/ML IV SOLN
INTRAVENOUS | Status: DC | PRN
  Administered 2023-04-07: 1000 mg via INTRAVENOUS

## 2023-04-07 MED ORDER — DIPHENHYDRAMINE HCL 12.5 MG/5ML PO ELIX: 12.5000 mg | ORAL_SOLUTION | Freq: Four times a day (QID) | ORAL | Status: DC | PRN

## 2023-04-07 MED ORDER — PANTOPRAZOLE SODIUM 40 MG IV SOLR
40.0000 mg | Freq: Two times a day (BID) | INTRAVENOUS | Status: DC
  Administered 2023-04-07 – 2023-04-09 (×5): 40 mg via INTRAVENOUS
  Filled 2023-04-07 (×5): qty 10

## 2023-04-07 MED ORDER — LIDOCAINE HCL (PF) 2 % IJ SOLN
INTRAMUSCULAR | Status: AC
Start: 2023-04-07 — End: ?
  Filled 2023-04-07: qty 5

## 2023-04-07 MED ORDER — HYDROMORPHONE HCL 1 MG/ML IJ SOLN
INTRAMUSCULAR | Status: AC
  Filled 2023-04-07: qty 2

## 2023-04-07 MED ORDER — SUGAMMADEX SODIUM 200 MG/2ML IV SOLN
INTRAVENOUS | Status: DC | PRN
  Administered 2023-04-07: 200 mg via INTRAVENOUS

## 2023-04-07 MED ORDER — MORPHINE SULFATE (PF) 4 MG/ML IV SOLN
4.0000 mg | Freq: Once | INTRAVENOUS | Status: AC
Start: 2023-04-07 — End: 2023-04-07
  Administered 2023-04-07: 4 mg via INTRAVENOUS
  Filled 2023-04-07: qty 1

## 2023-04-07 MED ORDER — OXYCODONE HCL 5 MG PO TABS: 5.0000 mg | ORAL_TABLET | Freq: Once | ORAL | Status: DC | PRN

## 2023-04-07 MED ORDER — PROCHLORPERAZINE MALEATE 10 MG PO TABS: 10.0000 mg | ORAL_TABLET | Freq: Four times a day (QID) | ORAL | Status: DC | PRN

## 2023-04-07 MED ORDER — LIDOCAINE HCL (CARDIAC) PF 100 MG/5ML IV SOSY
PREFILLED_SYRINGE | INTRAVENOUS | Status: DC | PRN
  Administered 2023-04-07: 60 mg via INTRAVENOUS

## 2023-04-07 MED ORDER — ONDANSETRON HCL 4 MG/2ML IJ SOLN
INTRAMUSCULAR | Status: AC
  Filled 2023-04-07: qty 2

## 2023-04-07 MED ORDER — DIPHENHYDRAMINE HCL 50 MG/ML IJ SOLN: 12.5000 mg | Freq: Four times a day (QID) | INTRAMUSCULAR | Status: DC | PRN

## 2023-04-07 MED ORDER — FENTANYL CITRATE (PF) 100 MCG/2ML IJ SOLN
INTRAMUSCULAR | Status: AC
  Filled 2023-04-07: qty 2

## 2023-04-07 MED ORDER — HYDRALAZINE HCL 20 MG/ML IJ SOLN: 10.0000 mg | INTRAMUSCULAR | Status: DC | PRN

## 2023-04-07 MED ORDER — ACETAMINOPHEN 10 MG/ML IV SOLN
INTRAVENOUS | Status: AC
  Filled 2023-04-07: qty 100

## 2023-04-07 MED ORDER — FENTANYL CITRATE PF 50 MCG/ML IJ SOSY
50.0000 ug | PREFILLED_SYRINGE | Freq: Once | INTRAMUSCULAR | Status: AC
  Administered 2023-04-07: 50 ug via INTRAVENOUS
  Filled 2023-04-07: qty 1

## 2023-04-07 MED ORDER — ONDANSETRON 4 MG PO TBDP: 4.0000 mg | ORAL_TABLET | Freq: Four times a day (QID) | ORAL | Status: DC | PRN

## 2023-04-07 MED ORDER — ONDANSETRON HCL 4 MG/2ML IJ SOLN
4.0000 mg | Freq: Four times a day (QID) | INTRAMUSCULAR | Status: DC | PRN
  Administered 2023-04-08: 4 mg via INTRAVENOUS
  Filled 2023-04-07: qty 2

## 2023-04-07 MED ORDER — ONDANSETRON HCL 4 MG/2ML IJ SOLN
INTRAMUSCULAR | Status: DC | PRN
  Administered 2023-04-07: 4 mg via INTRAVENOUS

## 2023-04-07 MED ORDER — AMPHETAMINE-DEXTROAMPHET ER 10 MG PO CP24
50.0000 mg | ORAL_CAPSULE | Freq: Every day | ORAL | Status: DC
  Administered 2023-04-09: 50 mg via ORAL
  Filled 2023-04-07 (×3): qty 5

## 2023-04-07 MED ORDER — IOHEXOL 300 MG/ML  SOLN
100.0000 mL | Freq: Once | INTRAMUSCULAR | Status: AC | PRN
  Administered 2023-04-07: 100 mL via INTRAVENOUS

## 2023-04-07 MED ORDER — OXYCODONE HCL 5 MG/5ML PO SOLN: 5.0000 mg | Freq: Once | ORAL | Status: DC | PRN

## 2023-04-07 MED ORDER — PIPERACILLIN-TAZOBACTAM 3.375 G IVPB
3.3750 g | Freq: Three times a day (TID) | INTRAVENOUS | Status: DC
  Administered 2023-04-07 – 2023-04-09 (×7): 3.375 g via INTRAVENOUS
  Filled 2023-04-07 (×7): qty 50

## 2023-04-07 MED ORDER — SUCCINYLCHOLINE CHLORIDE 200 MG/10ML IV SOSY
PREFILLED_SYRINGE | INTRAVENOUS | Status: AC
  Filled 2023-04-07: qty 10

## 2023-04-07 MED ORDER — ARIPIPRAZOLE 2 MG PO TABS
2.0000 mg | ORAL_TABLET | Freq: Every day | ORAL | Status: DC
Start: 2023-04-07 — End: 2023-04-10
  Administered 2023-04-08 – 2023-04-09 (×2): 2 mg via ORAL
  Filled 2023-04-07 (×4): qty 1

## 2023-04-07 MED ORDER — HEPARIN SODIUM (PORCINE) 5000 UNIT/ML IJ SOLN
5000.0000 [IU] | Freq: Three times a day (TID) | INTRAMUSCULAR | Status: DC
  Administered 2023-04-08 – 2023-04-09 (×5): 5000 [IU] via SUBCUTANEOUS
  Filled 2023-04-07 (×5): qty 1

## 2023-04-07 MED ORDER — ONDANSETRON HCL 4 MG/2ML IJ SOLN
4.0000 mg | Freq: Once | INTRAMUSCULAR | Status: AC
  Administered 2023-04-07: 4 mg via INTRAVENOUS
  Filled 2023-04-07: qty 2

## 2023-04-07 MED ORDER — LACTATED RINGERS IV SOLN: INTRAVENOUS | Status: DC | PRN

## 2023-04-07 MED ORDER — ROCURONIUM BROMIDE 10 MG/ML (PF) SYRINGE
PREFILLED_SYRINGE | INTRAVENOUS | Status: AC
Start: 2023-04-07 — End: ?
  Filled 2023-04-07: qty 10

## 2023-04-07 MED ORDER — LACTATED RINGERS IV SOLN: INTRAVENOUS | Status: DC

## 2023-04-07 MED ORDER — FENTANYL CITRATE PF 50 MCG/ML IJ SOSY
PREFILLED_SYRINGE | INTRAMUSCULAR | Status: AC
  Filled 2023-04-07: qty 3

## 2023-04-07 MED ORDER — PHENYLEPHRINE 80 MCG/ML (10ML) SYRINGE FOR IV PUSH (FOR BLOOD PRESSURE SUPPORT)
PREFILLED_SYRINGE | INTRAVENOUS | Status: DC | PRN
  Administered 2023-04-07: 160 ug via INTRAVENOUS
  Administered 2023-04-07: 80 ug via INTRAVENOUS
  Administered 2023-04-07: 240 ug via INTRAVENOUS

## 2023-04-07 MED ORDER — LACTATED RINGERS IV BOLUS
1000.0000 mL | Freq: Once | INTRAVENOUS | Status: AC
  Administered 2023-04-07: 1000 mL via INTRAVENOUS

## 2023-04-07 MED ORDER — DEXAMETHASONE SODIUM PHOSPHATE 10 MG/ML IJ SOLN
INTRAMUSCULAR | Status: DC | PRN
  Administered 2023-04-07: 4 mg via INTRAVENOUS

## 2023-04-07 MED ORDER — HYDROMORPHONE HCL 1 MG/ML IJ SOLN
0.2500 mg | INTRAMUSCULAR | Status: DC | PRN
  Administered 2023-04-07 (×4): 0.5 mg via INTRAVENOUS

## 2023-04-07 MED ORDER — DROPERIDOL 2.5 MG/ML IJ SOLN: 0.6250 mg | Freq: Once | INTRAMUSCULAR | Status: DC | PRN

## 2023-04-07 MED ORDER — DEXAMETHASONE SODIUM PHOSPHATE 10 MG/ML IJ SOLN
INTRAMUSCULAR | Status: AC
  Filled 2023-04-07: qty 1

## 2023-04-07 MED ORDER — PROPOFOL 10 MG/ML IV BOLUS
INTRAVENOUS | Status: DC | PRN
  Administered 2023-04-07: 40 mg via INTRAVENOUS
  Administered 2023-04-07: 120 mg via INTRAVENOUS

## 2023-04-07 MED ORDER — HYDROMORPHONE HCL 1 MG/ML IJ SOLN
1.0000 mg | INTRAMUSCULAR | Status: DC | PRN
  Administered 2023-04-07 – 2023-04-09 (×15): 1 mg via INTRAVENOUS
  Filled 2023-04-07 (×15): qty 1

## 2023-04-07 MED ORDER — ROCURONIUM BROMIDE 100 MG/10ML IV SOLN
INTRAVENOUS | Status: DC | PRN
  Administered 2023-04-07: 40 mg via INTRAVENOUS
  Administered 2023-04-07: 10 mg via INTRAVENOUS

## 2023-04-07 MED ORDER — DEXTROSE-SODIUM CHLORIDE 5-0.45 % IV SOLN: INTRAVENOUS | Status: DC

## 2023-04-07 MED ORDER — FENTANYL CITRATE (PF) 100 MCG/2ML IJ SOLN
INTRAMUSCULAR | Status: DC | PRN
  Administered 2023-04-07 (×3): 50 ug via INTRAVENOUS

## 2023-04-07 MED ORDER — ACETAMINOPHEN 10 MG/ML IV SOLN: 1000.0000 mg | Freq: Once | INTRAVENOUS | Status: DC | PRN

## 2023-04-07 MED ORDER — SUCCINYLCHOLINE CHLORIDE 200 MG/10ML IV SOSY
PREFILLED_SYRINGE | INTRAVENOUS | Status: DC | PRN
  Administered 2023-04-07: 100 mg via INTRAVENOUS

## 2023-04-07 MED ORDER — LORAZEPAM 2 MG/ML IJ SOLN
2.0000 mg | Freq: Four times a day (QID) | INTRAMUSCULAR | Status: DC | PRN
  Administered 2023-04-07 – 2023-04-08 (×2): 2 mg via INTRAVENOUS
  Filled 2023-04-07 (×3): qty 1

## 2023-04-07 MED ORDER — FENTANYL CITRATE PF 50 MCG/ML IJ SOSY
25.0000 ug | PREFILLED_SYRINGE | INTRAMUSCULAR | Status: DC | PRN
  Administered 2023-04-07 (×2): 50 ug via INTRAVENOUS

## 2023-04-07 MED ORDER — PIPERACILLIN-TAZOBACTAM 3.375 G IVPB 30 MIN
3.3750 g | Freq: Once | INTRAVENOUS | Status: AC
  Administered 2023-04-07: 3.375 g via INTRAVENOUS
  Filled 2023-04-07: qty 50

## 2023-04-07 MED ORDER — LISDEXAMFETAMINE DIMESYLATE 10 MG PO CAPS: 10.0000 mg | ORAL_CAPSULE | Freq: Every day | ORAL | Status: DC

## 2023-04-07 MED ORDER — METHOCARBAMOL 1000 MG/10ML IJ SOLN
500.0000 mg | Freq: Three times a day (TID) | INTRAMUSCULAR | Status: DC
  Administered 2023-04-07 – 2023-04-09 (×7): 500 mg via INTRAVENOUS
  Filled 2023-04-07 (×7): qty 10

## 2023-04-07 MED ORDER — HYDROXYZINE HCL 50 MG/ML IM SOLN: 25.0000 mg | Freq: Four times a day (QID) | INTRAMUSCULAR | Status: DC | PRN

## 2023-04-07 MED ORDER — PROPOFOL 10 MG/ML IV BOLUS
INTRAVENOUS | Status: AC
  Filled 2023-04-07: qty 20

## 2023-04-07 MED ORDER — ACETAMINOPHEN 10 MG/ML IV SOLN
1000.0000 mg | Freq: Four times a day (QID) | INTRAVENOUS | Status: AC
  Administered 2023-04-07 – 2023-04-08 (×4): 1000 mg via INTRAVENOUS
  Filled 2023-04-07 (×4): qty 100

## 2023-04-07 MED ORDER — AMPHETAMINE-DEXTROAMPHETAMINE 10 MG PO TABS: 10.0000 mg | ORAL_TABLET | Freq: Every day | ORAL | Status: DC

## 2023-04-07 MED ORDER — 0.9 % SODIUM CHLORIDE (POUR BTL) OPTIME
TOPICAL | Status: DC | PRN
  Administered 2023-04-07: 2000 mL

## 2023-04-07 SURGICAL SUPPLY — 35 items
BAG COUNTER SPONGE SURGICOUNT (BAG) IMPLANT
BIOPATCH WHT 1IN DISK W/4.0 H (GAUZE/BANDAGES/DRESSINGS) IMPLANT
BLADE EXTENDED COATED 6.5IN (ELECTRODE) IMPLANT
CHLORAPREP W/TINT 26 (MISCELLANEOUS) ×1 IMPLANT
COVER MAYO STAND STRL (DRAPES) ×3 IMPLANT
COVER SURGICAL LIGHT HANDLE (MISCELLANEOUS) ×1 IMPLANT
DRAIN CHANNEL 19F RND (DRAIN) IMPLANT
DRAPE LAPAROSCOPIC ABDOMINAL (DRAPES) ×1 IMPLANT
DRSG OPSITE POSTOP 4X10 (GAUZE/BANDAGES/DRESSINGS) IMPLANT
DRSG OPSITE POSTOP 4X6 (GAUZE/BANDAGES/DRESSINGS) IMPLANT
DRSG OPSITE POSTOP 4X8 (GAUZE/BANDAGES/DRESSINGS) IMPLANT
DRSG TEGADERM 4X4.75 (GAUZE/BANDAGES/DRESSINGS) IMPLANT
ELECT REM PT RETURN 15FT ADLT (MISCELLANEOUS) ×1 IMPLANT
EVACUATOR SILICONE 100CC (DRAIN) IMPLANT
GAUZE SPONGE 4X4 12PLY STRL (GAUZE/BANDAGES/DRESSINGS) IMPLANT
GLOVE BIO SURGEON STRL SZ7 (GLOVE) ×2 IMPLANT
GLOVE BIOGEL PI IND STRL 7.5 (GLOVE) ×2 IMPLANT
GOWN STRL REUS W/ TWL LRG LVL3 (GOWN DISPOSABLE) ×2 IMPLANT
HANDLE SUCTION POOLE (INSTRUMENTS) IMPLANT
KIT TURNOVER KIT A (KITS) IMPLANT
PACK COLON (CUSTOM PROCEDURE TRAY) ×1 IMPLANT
PENCIL SMOKE EVACUATOR (MISCELLANEOUS) IMPLANT
STAPLER SKIN PROX WIDE 3.9 (STAPLE) ×1 IMPLANT
SUCTION POOLE HANDLE (INSTRUMENTS)
SUT ETHILON 2 0 PS N (SUTURE) IMPLANT
SUT NOVA NAB GS-21 0 18 T12 DT (SUTURE) IMPLANT
SUT NOVA NAB GS-21 1 T12 (SUTURE) IMPLANT
SUT PDS AB 1 TP1 96 (SUTURE) IMPLANT
SUT SILK 2 0 SH CR/8 (SUTURE) ×1 IMPLANT
SUT SILK 2-0 18XBRD TIE 12 (SUTURE) ×1 IMPLANT
SUT SILK 3 0 SH CR/8 (SUTURE) ×1 IMPLANT
SUT SILK 3-0 18XBRD TIE 12 (SUTURE) ×1 IMPLANT
TOWEL OR 17X26 10 PK STRL BLUE (TOWEL DISPOSABLE) IMPLANT
TRAY FOLEY MTR SLVR 14FR STAT (SET/KITS/TRAYS/PACK) IMPLANT
TRAY FOLEY MTR SLVR 16FR STAT (SET/KITS/TRAYS/PACK) IMPLANT

## 2023-04-07 NOTE — Progress Notes (Signed)
 ED Pharmacy Antibiotic Sign Off An antibiotic consult was received from an ED provider for Zosyn  per pharmacy dosing for intra-abdominal infection. A chart review was completed to assess appropriateness.   The following one time order(s) were placed:  Zosyn  3.375g IV x 1 over 30 minutes  Further antibiotic and/or antibiotic pharmacy consults should be ordered by the admitting provider if indicated.   Thank you for allowing pharmacy to be a part of this patient's care.   Sem Mccaughey M, [facility]  Clinical Pharmacist 04/07/23 10:15 AM

## 2023-04-07 NOTE — Anesthesia Procedure Notes (Signed)
 Procedure Name: Intubation Date/Time: 04/07/2023 11:45 AM  Performed by: Para Jerelene CROME, CRNAPre-anesthesia Checklist: Patient identified, Emergency Drugs available, Suction available and Patient being monitored Patient Re-evaluated:Patient Re-evaluated prior to induction Oxygen Delivery Method: Circle System Utilized Preoxygenation: Pre-oxygenation with 100% oxygen Induction Type: IV induction and Rapid sequence Laryngoscope Size: Miller and 3 Grade View: Grade II Tube type: Oral Tube size: 7.0 mm Number of attempts: 1 Airway Equipment and Method: Stylet and Oral airway Placement Confirmation: ETT inserted through vocal cords under direct vision, positive ETCO2 and breath sounds checked- equal and bilateral Secured at: 22 cm Tube secured with: Tape Dental Injury: Teeth and Oropharynx as per pre-operative assessment

## 2023-04-07 NOTE — Addendum Note (Signed)
 Addendum  created 04/07/23 1439 by Oletha Cruel, CRNA   Flowsheet accepted, Intraprocedure Event edited, Intraprocedure Flowsheets edited, Intraprocedure Meds edited

## 2023-04-07 NOTE — Op Note (Signed)
 Preoperative diagnosis: Perforated peptic ulcer Postoperative diagnosis: Perforated prepyloric ulcer Procedure: 1.  Exploratory laparotomy with Arlyss patch of perforated ulcer 2.  Gastric wedge biopsy Surgeon: Dr. Adina Bury Anesthesia: General Estimated blood loss: Less than 50 cc Complications: None Drains: 19 French Blake drain to right upper quadrant Sponge and count was correct completion Specimens: Gastric wedge to pathology Disposition recovery stable  Indications: This is 73 female multiple medical issues and surgeries who presented with abdominal pain.  She appeared to have a perforated ulcer with free fluid and air and a CT scan.  She has been taking a fair amount of ibuprofen due to hip pain prior to a hip surgery scheduled in March.  We discussed proceeding the operating room.  Procedure: After informed consent was obtained she was taken to the operating room.  She was given antibiotics.  SCDs were in place.  She was placed under general anesthesia without complication.  She was prepped and draped in standard sterile surgical fashion.  A surgical timeout was then performed.  A midline incision was made.  This was somewhat difficult that she had had a prior diastases repair with multiple sutures.  I was able to enter into her peritoneal cavity without difficulty.  She had some murky fluid present.  I was able to visualize her stomach and her pylorus.  She had a perforated 5 mm prepyloric ulcer present.  This was all hard and around there.  I did remove a piece of the gastric wall as a biopsy.  I then was able to close this quite easily with silk suture.  I then made a tongue of omentum and placed it over at this.  I sutured this into place in a Graham patch fashion with 2-0 silk suture.  This completely covered the defect.  I did not narrow any of the stomach.  I then evacuated any fluid.  Hemostasis was observed.  I placed a 58 French Blake drain through the right side.  I secured  this with a 2-0 nylon suture.  I confirmed placement of the NG tube in the correct position.  I then closed the abdomen with #1 looped PDS and staples.  I placed a couple of Telfa wicks and a dressing.  She tolerated this well was extubated and transferred to recovery.

## 2023-04-07 NOTE — Progress Notes (Signed)
 Pharmacy Antibiotic Note  Kara [name] is a 68 y.o. female admitted on 04/07/2023 with abdominal pain. Patient underwent exploratory laparotomy with Arlyss patch of perforated ulcer, gastric wedge biopsy in OR today. Pharmacy has been consulted for 5 day course of Zosyn  for perforated ulcer with contamination. Received a dose in ED prior to surgery.   Plan: Zosyn  3.375g IV q8h (each dose infused over 4 hours) x 5 days  Need for further dosage adjustment appears unlikely at present, so pharmacy will sign off at this time.  Please reconsult if a change in clinical status warrants re-evaluation of dosage.   Height: 5' 4 (162.6 cm) Weight: 59 kg (130 lb) IBW/kg (Calculated) : 54.7  Temp (24hrs), Avg:98.3 F (36.8 C), Min:97.5 F (36.4 C), Max:98.9 F (37.2 C)  Recent Labs  Lab 04/07/23 0756  WBC 9.0  CREATININE 0.58    Estimated Creatinine Clearance: 59.7 mL/min (by C-G formula based on SCr of 0.58 mg/dL).    Allergies  Allergen Reactions   Aspirin Swelling    Throat swells   Bee Venom Anaphylaxis   Ibuprofen Swelling    Throat swells   Ativan  [Lorazepam ] Other (See Comments)    Agitation/confusion    Ketamine  Other (See Comments)    Hallucinations   Toradol  [Ketorolac  Tromethamine ] Itching, Swelling and Rash   Tramadol Hcl Itching, Swelling and Rash    Tolerates Dilaudid  06/2016.  TDD.     Thank you for allowing pharmacy to be a part of this patient's care.  Cacey Willow, PharmD, BCPS Clinical Pharmacist 04/07/2023 1:08 PM

## 2023-04-07 NOTE — ED Triage Notes (Signed)
 Pt reports RLQ abdominal pain that radiates to umbilical. Pt reports this came on suddenly within the last hour. Pt denies other symptoms.

## 2023-04-07 NOTE — ED Provider Notes (Addendum)
 [place] EMERGENCY DEPARTMENT AT [facility] Provider Note   CSN: [id number] Arrival date & time: 04/07/23  9373     History  Chief Complaint  Patient presents with   Abdominal Pain    Kara [name] is a 68 y.o. female.  68 year old female presents today for concern of sudden onset of abdominal pain mostly concentrated in the right lower quadrant.  She states it feels like a contraction.  No prior history of abdominal surgeries.  Endorses vomiting, dysuria, or other complaints.  No fever. Patient is a bleeding right hip surgery.  She states she has been taking ibuprofen twice daily 600 mg for the past year.  The history is provided by the patient. No language interpreter was used.       Home Medications Prior to Admission medications   Medication Sig Start Date End Date Taking? Authorizing Provider  ALPRAZolam  (XANAX ) 1 MG tablet Take 1 mg by mouth daily as needed for anxiety.  10/07/19   [provider]  amphetamine -dextroamphetamine  (ADDERALL  XR) 30 MG 24 hr capsule Take 60 mg by mouth every morning. 09/30/19   [provider]  amphetamine -dextroamphetamine  (ADDERALL ) 30 MG tablet Take 30 mg by mouth every evening. 09/30/19   [provider]  carbamazepine  (TEGRETOL ) 200 MG tablet Take 1 tablet (200 mg total) by mouth 3 (three) times daily. Patient not taking: Reported on 10/08/2019 12/20/18   Malinda Rogue, MD  cyclobenzaprine  (FLEXERIL ) 10 MG tablet Take 10 mg by mouth 2 (two) times daily as needed for muscle spasms.  Patient not taking: Reported on 03/16/2023 09/25/19   [provider]  DULoxetine  (CYMBALTA ) 20 MG capsule Take 1 capsule (20 mg total) by mouth daily. 03/16/23   Kirsteins, Prentice BRAVO, MD  lisinopril  (PRINIVIL ,ZESTRIL ) 10 MG tablet TAKE 1 AND 1/2 TABLETS DAILY BY MOUTH 03/07/18   Kuneff, Renee A, DO  meloxicam  (MOBIC ) 15 MG tablet TAKE 1 TABLET (15 MG) BY MOUTH EVERY DAY Patient not taking: Reported on 03/16/2023 07/06/17    Nitka, James E, MD  QUEtiapine  (SEROQUEL ) 25 MG tablet Take 25 mg by mouth at bedtime. Patient not taking: Reported on 03/16/2023    [provider]  tretinoin  (RETIN-A ) 0.1 % cream Apply 1 application topically at bedtime as needed (facial blemishes).  Patient not taking: Reported on 03/16/2023 01/28/18   [provider]  valACYclovir  (VALTREX ) 1000 MG tablet Take 2 tablets (2,000 mg total) by mouth 2 (two) times daily. Patient not taking: Reported on 03/16/2023 07/21/18   Gretta Ozell CROME, PA-C  zolpidem  (AMBIEN ) 10 MG tablet Take 10 mg by mouth at bedtime as needed for sleep.    [provider]      Allergies    Aspirin, Bee venom, Ibuprofen, Ativan  [lorazepam ], Ketamine , Toradol  [ketorolac  tromethamine ], and Tramadol hcl    Review of Systems   Review of Systems  Constitutional:  Negative for chills and fever.  Gastrointestinal:  Positive for abdominal pain. Negative for diarrhea, nausea and vomiting.  Genitourinary:  Negative for dysuria and flank pain.  All other systems reviewed and are negative.   Physical Exam Updated Vital Signs BP 118/73   Pulse 73   Temp (!) 97.5 F (36.4 C) (Oral)   Resp 18   Ht 5' 4 (1.626 m)   Wt 59 kg   SpO2 100%   BMI 22.31 kg/m  Physical Exam Vitals and nursing note reviewed.  Constitutional:      General: She is not in acute distress.  Appearance: Normal appearance. She is not ill-appearing.  HENT:     Head: Normocephalic and atraumatic.     Nose: Nose normal.  Eyes:     General: No scleral icterus.    Extraocular Movements: Extraocular movements intact.     Conjunctiva/sclera: Conjunctivae normal.  Cardiovascular:     Rate and Rhythm: Normal rate and regular rhythm.  Pulmonary:     Effort: Pulmonary effort is normal. No respiratory distress.     Breath sounds: Normal breath sounds. No wheezing or rales.  Abdominal:     General: There is no distension.     Tenderness: There is abdominal tenderness.  There is guarding.  Genitourinary:    Comments: Rectal pain with dark stools with specks of bright red blood.  Paramedic Lauren present as chaperone. Musculoskeletal:        General: Normal range of motion.     Cervical back: Normal range of motion.  Skin:    General: Skin is warm and dry.  Neurological:     General: No focal deficit present.     Mental Status: She is alert. Mental status is at baseline.     ED Results / Procedures / Treatments   Labs (all labs ordered are listed, but only abnormal results are displayed) Labs Reviewed  CBC WITH DIFFERENTIAL/PLATELET - Abnormal; Notable for the following components:      Result Value   RBC 2.91 (*)    Hemoglobin 8.1 (*)    HCT 26.5 (*)    Platelets 408 (*)    All other components within normal limits  COMPREHENSIVE METABOLIC PANEL  LIPASE, BLOOD  URINALYSIS, ROUTINE W REFLEX MICROSCOPIC    EKG None  Radiology No results found.  Procedures .Critical Care  Performed by: Hildegard Loge, PA-C Authorized by: Hildegard Loge, PA-C   Critical care provider statement:    Critical care time (minutes):  45   Critical care was necessary to treat or prevent imminent or life-threatening deterioration of the following conditions: Perforated gastric ulcer.   Critical care was time spent personally by me on the following activities:  Development of treatment plan with patient or surrogate, discussions with consultants, evaluation of patient's response to treatment, examination of patient, ordering and review of laboratory studies, ordering and review of radiographic studies, ordering and performing treatments and interventions, pulse oximetry, re-evaluation of patient's condition and review of old charts     Medications Ordered in ED Medications  morphine  (PF) 4 MG/ML injection 4 mg (4 mg Intravenous Given 04/07/23 0801)  lactated ringers  bolus 1,000 mL (1,000 mLs Intravenous New Bag/Given 04/07/23 0807)  ondansetron  (ZOFRAN ) injection 4 mg  (4 mg Intravenous Given 04/07/23 0801)    ED Course/ Medical Decision Making/ A&P Clinical Course as of 04/07/23 1100  Sat Apr 07, 2023  0940 Received a call from radiology advising that patient CT shows evidence of perforated gastric ulcer. [AA]  1003 Discussed with general surgery.  They recommend holding off on any blood transfusion at this time.  They will evaluate patient.  Antibiotic, PPI ordered. [AA]    Clinical Course User Index [AA] Hildegard Loge, PA-C                                 Medical Decision Making Amount and/or Complexity of Data Reviewed Labs: ordered. Radiology: ordered.  Risk Prescription drug management. Decision regarding hospitalization.   Medical Decision Making / ED Course   This  patient presents to the ED for concern of abdominal pain, this involves an extensive number of treatment options, and is a complaint that carries with it a high risk of complications and morbidity.  The differential diagnosis includes colitis, appendicitis, diverticulitis, pancreatitis, UTI, pyelonephritis, nephrolithiasis  MDM: 68 year old female presents today for concern of sudden onset of right lower quadrant abdominal pain that started just prior to arrival.  No other associated symptoms.  Appears uncomfortable.  Significant tenderness to palpation mostly in the right side of the abdomen with guarding.  Hemodynamically stable.  Will obtain labs, provide symptom control and obtain imaging.  She did admit to significant and chronic NSAID use. Discussed with general surgery.  They will evaluate patient for admission due to perforated gastric ulcer.  Patient is hemodynamically stable.  Antibiotic, PPI ordered.  Type and screen ordered.   Lab Tests: -I ordered, reviewed, and interpreted labs.   The pertinent results include:   Labs Reviewed  CBC WITH DIFFERENTIAL/PLATELET - Abnormal; Notable for the following components:      Result Value   RBC 2.91 (*)    Hemoglobin 8.1 (*)     HCT 26.5 (*)    Platelets 408 (*)    All other components within normal limits  COMPREHENSIVE METABOLIC PANEL  LIPASE, BLOOD  URINALYSIS, ROUTINE W REFLEX MICROSCOPIC      EKG  EKG Interpretation Date/Time:    Ventricular Rate:    PR Interval:    QRS Duration:    QT Interval:    QTC Calculation:   R Axis:      Text Interpretation:           Imaging Studies ordered: I ordered imaging studies including CT abdomen pelvis with contrast I independently visualized and interpreted imaging. I agree with the radiologist interpretation   Medicines ordered and prescription drug management: Meds ordered this encounter  Medications   morphine  (PF) 4 MG/ML injection 4 mg   lactated ringers  bolus 1,000 mL   ondansetron  (ZOFRAN ) injection 4 mg    -I have reviewed the patients home medicines and have made adjustments as needed  Critical interventions IV antibiotics, general surgery consult  Consultations Obtained: I requested consultation with the general surgery,  and discussed lab and imaging findings as well as pertinent plan - they recommend: As above    Reevaluation: After the interventions noted above, I reevaluated the patient and found that they have :stayed the same  Co morbidities that complicate the patient evaluation  Past Medical History:  Diagnosis Date   ADD (attention deficit disorder)    on Adderal   ADD (attention deficit disorder)    Aggressive behavior of adult    Anemia    Anxiety    Cellulitis of breast 11/2013   RIGHT BREAST   Childhood asthma    as child   Chronic fatigue and immune dysfunction syndrome (HCC)    Chronic lower back pain    Chronic pain    went to [facility] for pain control; stopped in 2016  (02/23/2017)   Cold sore    Complication of anesthesia    Ketamine  makes her hallucinate   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   Post-nasal drip    hx of   Pre-diabetes    patient states she is not Pre-Diabetic at appt. 12/16/2018   PTSD (post-traumatic stress disorder)    RA (rheumatoid arthritis) (HCC)    autoimmune arthritis   RA (rheumatoid arthritis) (HCC)    Spondylitis (HCC)    Suicidal intent       Dispostion: Discussed with surgery.  They will evaluate patient for further management.   Final Clinical Impression(s) / ED Diagnoses Final diagnoses:  Acute gastric ulcer with perforation [facility])    Rx / DC Orders ED Discharge Orders     None         Hildegard Loge, PA-C 04/07/23 1136    Hildegard Loge, PA-C 04/07/23 1201    Ellouise Victoria K, DO 04/07/23 1343

## 2023-04-07 NOTE — Transfer of Care (Signed)
 Immediate Anesthesia Transfer of Care Note  Patient: Kara [name]  Procedure(s) Performed: EXPLORATORY LAPAROTOMY; GRAHAM PATCH (Abdomen)  Patient Location: PACU  Anesthesia Type:General  Level of Consciousness: drowsy and patient cooperative  Airway & Oxygen Therapy: Patient Spontanous Breathing and Patient connected to nasal cannula oxygen  Post-op Assessment: Report given to RN and Post -op Vital signs reviewed and stable  Post vital signs: Reviewed and stable  Last Vitals:  Vitals Value Taken Time  BP 135/85 04/07/23 1252  Temp 36.8 C 04/07/23 1252  Pulse 88 04/07/23 1257  Resp 13 04/07/23 1257  SpO2 99 % 04/07/23 1257  Vitals shown include unfiled device data.  Last Pain:  Vitals:   04/07/23 1021  TempSrc: Oral  PainSc:          Complications: No notable events documented.

## 2023-04-07 NOTE — Anesthesia Postprocedure Evaluation (Signed)
 Anesthesia Post Note  Patient: Kara [name]  Procedure(s) Performed: EXPLORATORY LAPAROTOMY; GRAHAM PATCH (Abdomen)     Patient location during evaluation: PACU Anesthesia Type: General Level of consciousness: awake and alert Pain management: pain level controlled Vital Signs Assessment: post-procedure vital signs reviewed and stable Respiratory status: spontaneous breathing, nonlabored ventilation, respiratory function stable and patient connected to nasal cannula oxygen Cardiovascular status: blood pressure returned to baseline and stable Postop Assessment: no apparent nausea or vomiting Anesthetic complications: no   No notable events documented.  Last Vitals:  Vitals:   04/07/23 1325 04/07/23 1330  BP:  132/79  Pulse: 77 80  Resp: 12 18  Temp:    SpO2: 100% 100%    Last Pain:  Vitals:   04/07/23 1252  TempSrc:   PainSc: 10-Worst pain ever                 Thom JONELLE Peoples

## 2023-04-07 NOTE — ED Notes (Signed)
 Pt is aware that we need a urine sample. She stated she may be able to go after her fluids run in.

## 2023-04-07 NOTE — Anesthesia Preprocedure Evaluation (Addendum)
 Anesthesia Evaluation  Patient identified by MRN, date of birth, ID band Patient awake    Reviewed: Allergy & Precautions, H&P , NPO status , Patient's Chart, lab work & pertinent test results  Airway Mallampati: II  TM Distance: >3 FB Neck ROM: Full    Dental no notable dental hx.    Pulmonary asthma , former smoker   Pulmonary exam normal breath sounds clear to auscultation       Cardiovascular hypertension, Normal cardiovascular exam Rhythm:Regular Rate:Normal     Neuro/Psych  PSYCHIATRIC DISORDERS Anxiety Depression    negative neurological ROS     GI/Hepatic negative GI ROS, Neg liver ROS,,,  Endo/Other  negative endocrine ROS    Renal/GU negative Renal ROS  negative genitourinary   Musculoskeletal  (+)  Fibromyalgia -  Abdominal   Peds negative pediatric ROS (+)  Hematology  (+) Blood dyscrasia, anemia   Anesthesia Other Findings   Reproductive/Obstetrics negative OB ROS                             Anesthesia Physical Anesthesia Plan  ASA: 3  Anesthesia Plan: General   Post-op Pain Management:    Induction: Intravenous and Rapid sequence  PONV Risk Score and Plan: Ondansetron , Dexamethasone  and Treatment may vary due to age or medical condition  Airway Management Planned: Oral ETT  Additional Equipment:   Intra-op Plan:   Post-operative Plan: Extubation in OR  Informed Consent: I have reviewed the patients History and Physical, chart, labs and discussed the procedure including the risks, benefits and alternatives for the proposed anesthesia with the patient or authorized representative who has indicated his/her understanding and acceptance.     Dental advisory given  Plan Discussed with: CRNA  Anesthesia Plan Comments:         Anesthesia Quick Evaluation

## 2023-04-07 NOTE — ED Notes (Signed)
 POC occult blood Positive

## 2023-04-07 NOTE — Plan of Care (Signed)
  Problem: Health Behavior/Discharge Planning: Goal: Ability to manage health-related needs will improve Outcome: Progressing   Problem: Clinical Measurements: Goal: Ability to maintain clinical measurements within normal limits will improve Outcome: Progressing Goal: Cardiovascular complication will be avoided Outcome: Progressing   Problem: Activity: Goal: Risk for activity intolerance will decrease Outcome: Progressing   Problem: Elimination: Goal: Will not experience complications related to urinary retention Outcome: Progressing   Problem: Coping: Goal: Level of anxiety will decrease Outcome: Not Progressing

## 2023-04-07 NOTE — H&P (Signed)
 Kara [name] is an 68 y.o. female.   Chief Complaint: ab pain HPI: 86 yof with mmp and multiple surgeries (tubal and sling only abdominal) who presents with ab pain. She has RA but has not been on medication for years. She has been having black stools for some time.  Hct is lower than four months ago. No brbpr.  She has prior csc but this is some time ago and she states she is due for another.  Yesterday began having upper ab pain that worsened. When she ate it hurt.  This morning acutely worse. No fever.  No emesis.  She has hct of 26.5,  nl wbc and ct scan c/w perforated ulcer with free fluid and air.  When I see her she complains of diffuse ab pain. She is due for right hip replacement in March at OSH She does not have ulcer disease history, no prior egd  Past Medical History:  Diagnosis Date   ADD (attention deficit disorder)    on Adderal   ADD (attention deficit disorder)    Aggressive behavior of adult    Anemia    Anxiety    Cellulitis of breast 11/2013   RIGHT BREAST   Childhood asthma    as child   Chronic fatigue and immune dysfunction syndrome (HCC)    Chronic lower back pain    Chronic pain    went to [facility] for pain control; stopped in 2016  (02/23/2017)   Cold sore    Complication of anesthesia    Ketamine  makes her hallucinate   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   Post-nasal drip    hx of   Pre-diabetes    patient states she is not Pre-Diabetic at appt. 12/16/2018   PTSD (post-traumatic stress disorder)    RA (rheumatoid arthritis) (HCC)    autoimmune arthritis    RA (rheumatoid arthritis) (HCC)    Spondylitis (HCC)    Suicidal intent     Past Surgical History:  Procedure Laterality Date   ANTERIOR CERVICAL DECOMP/DISCECTOMY FUSION N/A 03/15/2015   Procedure: Cervical five-six, Cerival six-seven, Anterior Cervical Discectomy and Fusion, Allograft and Plate;  Surgeon: Oneil JAYSON Herald, MD;  Location: [facility];  Service: Orthopedics;  Laterality: N/A;   ANTERIOR LAT LUMBAR FUSION Left 06/11/2018   Procedure: Left Lumbar two-three Anterolateral decompression/fusion/lateral plate fixation;  Surgeon: Colon Shove, MD;  Location: [facility];  Service: Neurosurgery;  Laterality: Left;  Left Lumbar two-three Anterolateral decompression/fusion/lateral plate fixation   AUGMENTATION MAMMAPLASTY  2003   BACK SURGERY     BLADDER SUSPENSION  2009   BONE EXCISION Right 11/26/2018   Procedure: TRAPEZIUM EXCISION RIGHT THUMB;  Surgeon: Murrell Kuba, MD;  Location: [facility];  Service: Orthopedics;  Laterality: Right;  AXILLARY BLOCK   BREAST IMPLANT REMOVAL Bilateral 10/2013   CARPOMETACARPEL SUSPENSION PLASTY Right 11/26/2018   Procedure: BERNEDA SUSPENSION;  Surgeon: Murrell Kuba, MD;  Location: [facility];  Service: Orthopedics;  Laterality: Right;   CERVICAL WOUND DEBRIDEMENT  N/A 07/07/2016   Procedure: IRRIGATION AND DEBRIDEMENT POSTERIOR NECK;  Surgeon: Oneil JAYSON Herald, MD;  Location: [facility];  Service: Orthopedics;  Laterality: N/A;   COLONOSCOPY     COMBINED ABDOMINOPLASTY AND LIPOSUCTION  2003   INCISION AND DRAINAGE ABSCESS Right 01/16/2014   Procedure: INCISION AND DRAINAGE AND OF RIGHT BREAST ABCESS;  Surgeon: Morene Olives, MD;  Location: [facility];  Service: General;  Laterality: Right;   JOINT REPLACEMENT     LUMBAR LAMINECTOMY/DECOMPRESSION MICRODISCECTOMY N/A 12/10/2015   Procedure: Right L3-4 Hemilaminectomy, Excision of herniated nucleus pulposus;  Surgeon: Oneil JAYSON Herald, MD;  Location: [facility];  Service: Orthopedics;  Laterality: N/A;    MASS EXCISION  11/03/2011   Procedure: MINOR EXCISION OF MASS;  Surgeon: Lamar LULLA Leonor Mickey., MD;  Location: [facility];  Service: Orthopedics;  Laterality: Left;  debride IP joint, cyst excision left index   MAXIMUM ACCESS (MAS) TRANSFORAMINAL LUMBAR INTERBODY FUSION (TLIF) 2 LEVEL Right 02/23/2017   POSTERIOR CERVICAL FUSION/FORAMINOTOMY N/A 06/21/2016   Procedure: POSTERIOR CERVICAL FUSION C5-C7 SPINOUS PROCESS WIRING;  Surgeon: Herald Oneil JAYSON, MD;  Location: [facility];  Service: Orthopedics;  Laterality: N/A;   POSTERIOR LUMBAR FUSION  07/2009; 07/03/2014   L4-5; L5-S1   SHOULDER ARTHROSCOPY Right 2012   TENDON TRANSFER Right 11/26/2018   Procedure: TENDON TRANSFER;  Surgeon: Murrell Kuba, MD;  Location: [facility];  Service: Orthopedics;  Laterality: Right;   TONSILLECTOMY  1987   TOTAL SHOULDER ARTHROPLASTY  08/01/2011   Procedure: TOTAL SHOULDER ARTHROPLASTY;  Surgeon: Fonda SHAUNNA Olmsted, MD;  Location: [facility];  Service: Orthopedics;  Laterality: Right;  Right total shoulder arthroplasty   TUBAL LIGATION  1988    Family History  Problem Relation Age of Onset   Arthritis Mother    Heart disease Mother        ?psvt   Breast cancer Mother 59       TAH/BSO   Cancer Mother    Mental illness Mother    COPD Father    Hypertension Father    Alcohol  abuse Father    Mental illness Father    Heart disease Father    Healthy Daughter    Breast cancer Maternal Aunt 3       deceased   Cancer Cousin 34       female; unknown primary   Colon cancer Paternal Aunt 83       deceased at 43   Stomach cancer Paternal Uncle 37       deceased at 67   Alcohol  abuse Brother    Cancer Brother    Hodgkin's lymphoma Brother    Mental illness Brother    HIV Brother    Healthy Brother    Healthy Son    Social History:  reports that she quit smoking about 17 years ago. Her smoking use included cigarettes. She started smoking about 27 years ago. She has a 10 pack-year smoking  history. She has never used smokeless tobacco. She reports current alcohol  use. She reports that she does not currently use drugs after having used the following drugs: Marijuana.  Allergies:  Allergies  Allergen Reactions   Aspirin Swelling    Throat swells   Bee Venom Anaphylaxis   Ibuprofen Swelling    Throat swells   Ativan  [Lorazepam ] Other (See Comments)    Agitation/confusion    Ketamine  Other (See Comments)    Hallucinations   Toradol  [Ketorolac  Tromethamine ] Itching, Swelling and Rash  Tramadol Hcl Itching, Swelling and Rash    Tolerates Dilaudid  06/2016.  TDD.   Current Facility-Administered Medications  Medication Dose Route Frequency Provider Last Rate Last Admin   lactated ringers  infusion   Intravenous Continuous Ebbie Cough, MD       piperacillin -tazobactam (ZOSYN ) IVPB 3.375 g  3.375 g Intravenous Once Hildegard Loge, PA-C 100 mL/hr at 04/07/23 1020 3.375 g at 04/07/23 1020   Current Outpatient Medications  Medication Sig Dispense Refill   acetaminophen  (TYLENOL ) 325 MG tablet Take 650 mg by mouth every 4 (four) hours as needed.     ALPRAZolam  (XANAX ) 1 MG tablet Take 1 mg by mouth 2 (two) times daily.     amphetamine -dextroamphetamine  (ADDERALL  XR) 30 MG 24 hr capsule Take 60 mg by mouth every morning.     amphetamine -dextroamphetamine  (ADDERALL ) 30 MG tablet Take 30 mg by mouth every evening.     ARIPiprazole  (ABILIFY ) 2 MG tablet Take 2 mg by mouth at bedtime.     lisdexamfetamine (VYVANSE ) 10 MG capsule Take 10 mg by mouth daily.     UNABLE TO FIND Med Name: fentanyl  patch     zolpidem  (AMBIEN ) 10 MG tablet Take 10 mg by mouth at bedtime as needed for sleep.     DULoxetine  (CYMBALTA ) 20 MG capsule Take 1 capsule (20 mg total) by mouth daily. (Patient not taking: Reported on 04/07/2023) 30 capsule 1   hydrOXYzine  (VISTARIL ) 25 MG capsule Take 25 mg by mouth 4 (four) times daily. (Patient not taking: Reported on 04/07/2023)     lisinopril  (PRINIVIL ,ZESTRIL ) 10  MG tablet TAKE 1 AND 1/2 TABLETS DAILY BY MOUTH (Patient not taking: Reported on 04/07/2023) 45 tablet 1     Results for orders placed or performed during the hospital encounter of 04/07/23 (from the past 48 hours)  CBC with Differential     Status: Abnormal   Collection Time: 04/07/23  7:56 AM  Result Value Ref Range   WBC 9.0 4.0 - 10.5 K/uL   RBC 2.91 (L) 3.87 - 5.11 MIL/uL   Hemoglobin 8.1 (L) 12.0 - 15.0 g/dL   HCT 73.4 (L) 63.9 - 53.9 %   MCV 91.1 80.0 - 100.0 fL   MCH 27.8 26.0 - 34.0 pg   MCHC 30.6 30.0 - 36.0 g/dL   RDW 84.4 88.4 - 84.4 %   Platelets 408 (H) 150 - 400 K/uL   nRBC 0.0 0.0 - 0.2 %   Neutrophils Relative % 79 %   Neutro Abs 7.0 1.7 - 7.7 K/uL   Lymphocytes Relative 15 %   Lymphs Abs 1.4 0.7 - 4.0 K/uL   Monocytes Relative 4 %   Monocytes Absolute 0.4 0.1 - 1.0 K/uL   Eosinophils Relative 2 %   Eosinophils Absolute 0.2 0.0 - 0.5 K/uL   Basophils Relative 0 %   Basophils Absolute 0.0 0.0 - 0.1 K/uL   Immature Granulocytes 0 %   Abs Immature Granulocytes 0.03 0.00 - 0.07 K/uL    Comment: Performed at [facility], [address], [place], [place] [address]  Comprehensive metabolic panel     Status: Abnormal   Collection Time: 04/07/23  7:56 AM  Result Value Ref Range   Sodium 138 135 - 145 mmol/L   Potassium 3.7 3.5 - 5.1 mmol/L   Chloride 105 98 - 111 mmol/L   CO2 25 22 - 32 mmol/L   Glucose, Bld 95 70 - 99 mg/dL    Comment: Glucose reference range applies only to  samples taken after fasting for at least 8 hours.   BUN 20 8 - 23 mg/dL   Creatinine, Ser 9.41 0.44 - 1.00 mg/dL   Calcium 8.6 (L) 8.9 - 10.3 mg/dL   Total Protein 5.9 (L) 6.5 - 8.1 g/dL   Albumin  3.1 (L) 3.5 - 5.0 g/dL   AST 14 (L) 15 - 41 U/L   ALT 10 0 - 44 U/L   Alkaline Phosphatase 69 38 - 126 U/L   Total Bilirubin 0.3 0.0 - 1.2 mg/dL   GFR, Estimated >39 >39 mL/min    Comment: (NOTE) Calculated using the CKD-EPI Creatinine Equation (2021)    Anion gap 8 5 - 15     Comment: Performed at [facility], [address] [place], [place] [address]  Lipase, blood     Status: None   Collection Time: 04/07/23  7:56 AM  Result Value Ref Range   Lipase 23 11 - 51 U/L    Comment: Performed at [facility], [address] [place], [place] [address]  Type and screen [facility]     Status: None (Preliminary result)   Collection Time: 04/07/23  9:40 AM  Result Value Ref Range   ABO/RH(D) PENDING    Antibody Screen PENDING    Sample Expiration      04/10/2023,2359 Performed at [facility], [address] [place], [place] [address]   POC occult blood, ED     Status: Abnormal   Collection Time: 04/07/23 10:00 AM  Result Value Ref Range   Fecal Occult Bld POSITIVE (A) NEGATIVE   CT ABDOMEN PELVIS W CONTRAST Result Date: 04/07/2023 CLINICAL DATA:  Right lower quadrant pain radiating to umbilicus for several hours. EXAM: CT ABDOMEN AND PELVIS WITH CONTRAST TECHNIQUE: Multidetector CT imaging of the abdomen and pelvis was performed using the standard protocol following bolus administration of intravenous contrast. RADIATION DOSE REDUCTION: This exam was performed according to the departmental dose-optimization program which includes automated exposure control, adjustment of the mA and/or kV according to patient size and/or use of iterative reconstruction technique. CONTRAST:  OMNIPAQUE  IOHEXOL  300 MG/ML  SOLN COMPARISON:  11/21/2022 FINDINGS: Lower Chest: No acute findings. Hepatobiliary: A few small hepatic cysts are noted. No suspicious hepatic masses identified. Gallbladder is unremarkable. No evidence of biliary ductal dilatation. Pancreas:  No mass or inflammatory changes. Spleen: Within normal limits in size and appearance. Adrenals/Urinary Tract: No suspicious masses identified. No evidence of ureteral calculi or hydronephrosis. Stomach/Bowel: Small amount of free intraperitoneal  air is seen as well as small amount of ascites. Mild-to-moderate wall thickening of the gastric antrum is seen with few adjacent extraluminal air bubbles at this site, suspicious for perforated peptic ulcer. No evidence of abscess or bowel obstruction. Vascular/Lymphatic: No pathologically enlarged lymph nodes. No acute vascular findings. Reproductive:  No mass or other significant abnormality. Other:  Small right inguinal hernia noted, which contains only fat. Musculoskeletal: No suspicious bone lesions identified. Lumbosacral spine fusion hardware again noted. Severe right hip osteoarthritis is seen. IMPRESSION: Free intraperitoneal air, with wall thickening of the gastric antrum highly suggestive of perforated peptic ulcer as the source. No evidence of abscess or bowel obstruction. Critical Value/emergent results were called by telephone at the time of interpretation on 04/07/2023 at 9:45 am to provider AMJAD ALI , who verbally acknowledged these results. Electronically Signed   By: Norleen DELENA Kil M.D.   On: 04/07/2023 09:45    Review of Systems  Constitutional:  Negative for fever.  Gastrointestinal:  Positive for abdominal pain and nausea.  All other systems reviewed and are negative.   Blood pressure 115/62, pulse 65, temperature 98.6 F (37 C), temperature source Oral, resp. rate 16, height 5' 4 (1.626 m), weight 59 kg, SpO2 98%. Physical Exam Vitals reviewed.  Constitutional:      Appearance: She is well-developed.  Eyes:     General: No scleral icterus. Cardiovascular:     Rate and Rhythm: Normal rate and regular rhythm.  Pulmonary:     Effort: Pulmonary effort is normal.     Breath sounds: No wheezing.  Abdominal:     Palpations: Abdomen is soft.     Tenderness: There is abdominal tenderness (mostly in upper abdomen with peritonitis).     Hernia: No hernia is present.  Skin:    General: Skin is warm and dry.     Capillary Refill: Capillary refill takes less than 2 seconds.   Neurological:     General: No focal deficit present.     Mental Status: She is alert.  Psychiatric:        Mood and Affect: Mood normal.        Behavior: Behavior normal.      Assessment/Plan Perforated peptic ulcer -peritonitis with free perforation discussed going to OR urgently for laparotomy and likely patch repair of ulcer, possible biopsy -abx, iv fluids -will type and screen not sure about where anemia from exactly and will need to be evaluated once surgery done and possibly recovered -discussed surgery, risks and recovery with her  I reviewed all labs and vitals from er , reviewed ct and agree with perforation, discussed with er provider   Donnice Bury, MD 04/07/2023, 10:26 AM

## 2023-04-08 ENCOUNTER — Encounter (HOSPITAL_COMMUNITY): Payer: Self-pay | Admitting: General Surgery

## 2023-04-08 LAB — CBC
HCT: 25.5 % — ABNORMAL LOW (ref 36.0–46.0)
Hemoglobin: 8 g/dL — ABNORMAL LOW (ref 12.0–15.0)
MCH: 28.1 pg (ref 26.0–34.0)
MCHC: 31.4 g/dL (ref 30.0–36.0)
MCV: 89.5 fL (ref 80.0–100.0)
Platelets: 452 10*3/uL — ABNORMAL HIGH (ref 150–400)
RBC: 2.85 MIL/uL — ABNORMAL LOW (ref 3.87–5.11)
RDW: 15.7 % — ABNORMAL HIGH (ref 11.5–15.5)
WBC: 17.8 10*3/uL — ABNORMAL HIGH (ref 4.0–10.5)
nRBC: 0 % (ref 0.0–0.2)

## 2023-04-08 MED ORDER — CARMEX CLASSIC LIP BALM EX OINT
TOPICAL_OINTMENT | CUTANEOUS | Status: DC | PRN
  Filled 2023-04-08: qty 10

## 2023-04-08 MED ORDER — LACTATED RINGERS IV SOLN
INTRAVENOUS | Status: AC
Start: 2023-04-08 — End: 2023-04-09

## 2023-04-08 MED ORDER — MENTHOL 3 MG MT LOZG
1.0000 | LOZENGE | OROMUCOSAL | Status: DC | PRN
  Administered 2023-04-08: 3 mg via ORAL
  Filled 2023-04-08: qty 9

## 2023-04-08 NOTE — Progress Notes (Signed)
 Pt very very labile today. When awake pt has periods of screaming and crying over various issues. Pt begging nonstop for more pain med. Dr Ebbie aware and strategies discussed. Pt verbally and physically aggressive as well. Instructed on rationale for NPO status, purpose of NG tube, etc. Provided pt w Carmex and ice chips, NG tube retaped and clip replaced. Pt initially requested then refused throat spray. Later accepted lozenges. Pt given IV pain med at 0853, pt simultaneously due for po Adderall  at 0800. Message sent to pharmacy at 0915 requesting this med. Received med via secure tube at 1030 but pt already sleeping very soundly. Since pain control is a major issue, d/w CN and allowed pt to sleep. Pt awakened suddenly at 1345 screaming and crying hysterically. Pt verbally abusive, hysterical, demanding to speak w CN, etc. Pt's son and daughter in the room, very angry that pt is in pain and demanding that staff give pt more narcotics. Another nurse in to give pt meds and pt again verbally aggressive and physically aggressive. Security notified, AC up to speak w pt. Spoke w Dr Dasie and reviewed pt's meds (standing and prn meds). No new orders received. Pt cont to call repeatedly for numerous issues (she's hungry, the IV pump is beeping, she wants more pain meds, her throat hurts, now she has to postpone her hip surgery, etc). Pt eventually agreed to IV Ativan  and IV Robaxin .

## 2023-04-08 NOTE — Plan of Care (Signed)
   Problem: Nutrition: Goal: Adequate nutrition will be maintained Outcome: Progressing

## 2023-04-08 NOTE — Progress Notes (Signed)
 1 Day Post-Op   Subjective/Chief Complaint: No issues this am, no flatus, discussed surgery   Objective: Vital signs in last 24 hours: Temp:  [97.3 F (36.3 C)-98.9 F (37.2 C)] 97.8 F (36.6 C) (01/12 0430) Pulse Rate:  [60-95] 85 (01/12 0430) Resp:  [10-18] 18 (01/12 0430) BP: (108-153)/(62-103) 133/99 (01/12 0430) SpO2:  [84 %-100 %] 100 % (01/12 0430)    Intake/Output from previous day: 01/11 0701 - 01/12 0700 In: 1399.1 [P.O.:30; I.V.:1021.7; NG/GT:80; IV Piggyback:267.4] Out: 1785 [Urine:1250; Emesis/NG output:450; Drains:70; Blood:15] Intake/Output this shift: No intake/output data recorded.  General nad Cv regular Pulm effort normal GI soft approp tender incision intact jp serosang  Lab Results:  Recent Labs    04/07/23 0756 04/08/23 0511  WBC 9.0 17.8*  HGB 8.1* 8.0*  HCT 26.5* 25.5*  PLT 408* 452*   BMET Recent Labs    04/07/23 0756  NA 138  K 3.7  CL 105  CO2 25  GLUCOSE 95  BUN 20  CREATININE 0.58  CALCIUM 8.6*   PT/INR No results for input(s): LABPROT, INR in the last 72 hours. ABG No results for input(s): PHART, HCO3 in the last 72 hours.  Invalid input(s): PCO2, PO2  Studies/Results: DG Abd Portable 1V Result Date: 04/07/2023 CLINICAL DATA:  Nasogastric tube placement.  Postop. EXAM: PORTABLE ABDOMEN - 1 VIEW COMPARISON:  CT earlier today FINDINGS: Tip and side port of the enteric tube below the diaphragm in the stomach. Presumed drain in the right abdomen. Midline skin staples. Lower lumbar surgical hardware. IMPRESSION: Tip and side port of the enteric tube below the diaphragm in the stomach. Electronically Signed   By: Andrea Gasman M.D.   On: 04/07/2023 16:32   CT ABDOMEN PELVIS W CONTRAST Result Date: 04/07/2023 CLINICAL DATA:  Right lower quadrant pain radiating to umbilicus for several hours. EXAM: CT ABDOMEN AND PELVIS WITH CONTRAST TECHNIQUE: Multidetector CT imaging of the abdomen and pelvis was performed using  the standard protocol following bolus administration of intravenous contrast. RADIATION DOSE REDUCTION: This exam was performed according to the departmental dose-optimization program which includes automated exposure control, adjustment of the mA and/or kV according to patient size and/or use of iterative reconstruction technique. CONTRAST:  OMNIPAQUE  IOHEXOL  300 MG/ML  SOLN COMPARISON:  11/21/2022 FINDINGS: Lower Chest: No acute findings. Hepatobiliary: A few small hepatic cysts are noted. No suspicious hepatic masses identified. Gallbladder is unremarkable. No evidence of biliary ductal dilatation. Pancreas:  No mass or inflammatory changes. Spleen: Within normal limits in size and appearance. Adrenals/Urinary Tract: No suspicious masses identified. No evidence of ureteral calculi or hydronephrosis. Stomach/Bowel: Small amount of free intraperitoneal air is seen as well as small amount of ascites. Mild-to-moderate wall thickening of the gastric antrum is seen with few adjacent extraluminal air bubbles at this site, suspicious for perforated peptic ulcer. No evidence of abscess or bowel obstruction. Vascular/Lymphatic: No pathologically enlarged lymph nodes. No acute vascular findings. Reproductive:  No mass or other significant abnormality. Other:  Small right inguinal hernia noted, which contains only fat. Musculoskeletal: No suspicious bone lesions identified. Lumbosacral spine fusion hardware again noted. Severe right hip osteoarthritis is seen. IMPRESSION: Free intraperitoneal air, with wall thickening of the gastric antrum highly suggestive of perforated peptic ulcer as the source. No evidence of abscess or bowel obstruction. Critical Value/emergent results were called by telephone at the time of interpretation on 04/07/2023 at 9:45 am to provider AMJAD ALI , who verbally acknowledged these results. Electronically Signed   By:  Norleen DELENA Kil M.D.   On: 04/07/2023 09:45     Anti-infectives: Anti-infectives (From admission, onward)    Start     Dose/Rate Route Frequency Ordered Stop   04/07/23 1600  piperacillin -tazobactam (ZOSYN ) IVPB 3.375 g        3.375 g 12.5 mL/hr over 240 Minutes Intravenous Every 8 hours 04/07/23 1307 04/12/23 1559   04/07/23 1015  piperacillin -tazobactam (ZOSYN ) IVPB 3.375 g        3.375 g 100 mL/hr over 30 Minutes Intravenous  Once 04/07/23 1014 04/07/23 1050       Assessment/Plan: POD 1 graham patch ulcer, biopsy -npo, ng tube, will consider ugi in am -zosyn  2/5 -lovenox  -po home meds and clamp tube, ativan  -protonix  bid -oob, pulm toilet Donnice Bury 04/08/2023

## 2023-04-08 NOTE — Progress Notes (Addendum)
 Primary RN reported pt becoming increasingly irate, verbally abusive, and fired engineer, civil (consulting). Pt demanding pain medicine, and this nurse offered to administer for primary RN.  Pt continued to scream irately and escalating behavior. Space given for patient to air frustrations, however as pt's verbal aggression increased I instructed patient to lower volume so I could administer pain medicine.   Pt briefly de-escalated and pain medicine administered. Pt stated no one believes my pain and requested to use the [facility]. As I maneuvered equipment to assist pt, she again became verbally aggressive and grabbed my arms. Firmly stated to patient not to touch me and security would be called if she could not control her behavior. Pt began to mock this nurse and scream incoherently.   This nurse left the room and informed the [facility] of the incident. Security was called. CN and primary nurse informed.

## 2023-04-08 NOTE — Progress Notes (Signed)
 Pt's significant other in to visit w pt, multiple questions answered and pt seemed calmer in his presence. After this man left, pt stated to me that she was going to have him go home and bring her back a Fentanyl  patch for the pain. Instructed pt that this is very dangerous and could result in resp arrest or worse.

## 2023-04-08 NOTE — Plan of Care (Signed)
  Problem: Education: Goal: Knowledge of General Education information will improve Description: Including pain rating scale, medication(s)/side effects and non-pharmacologic comfort measures Outcome: Progressing   Problem: Health Behavior/Discharge Planning: Goal: Ability to manage health-related needs will improve Outcome: Progressing   Problem: Clinical Measurements: Goal: Ability to maintain clinical measurements within normal limits will improve Outcome: Progressing   Problem: Coping: Goal: Level of anxiety will decrease Outcome: Progressing   Problem: Pain Management: Goal: General experience of comfort will improve Outcome: Progressing

## 2023-04-08 NOTE — Progress Notes (Addendum)
 Received pt from PACU via bed, pt initially quiet and calm, but when getting pt settled and trying to assess pt's fresh post-op wound, pt suddenly grabbed my wrists and screamed No, you will not touch me! repeatedly. Explained to pt that I would not hurt her or touch her, but that I had to look at her fresh post-op dressing to assess for bleeding. Instructed pt to not grab me and she eventually allowed me to assess her dressing. Pt crying for pain meds. Several attempts made to get pt comfortable.

## 2023-04-09 ENCOUNTER — Inpatient Hospital Stay (HOSPITAL_COMMUNITY): Payer: Medicare Other

## 2023-04-09 LAB — CBC
HCT: 21.4 % — ABNORMAL LOW (ref 36.0–46.0)
Hemoglobin: 6.8 g/dL — CL (ref 12.0–15.0)
MCH: 28.7 pg (ref 26.0–34.0)
MCHC: 31.8 g/dL (ref 30.0–36.0)
MCV: 90.3 fL (ref 80.0–100.0)
Platelets: 403 10*3/uL — ABNORMAL HIGH (ref 150–400)
RBC: 2.37 MIL/uL — ABNORMAL LOW (ref 3.87–5.11)
RDW: 16 % — ABNORMAL HIGH (ref 11.5–15.5)
WBC: 10.9 10*3/uL — ABNORMAL HIGH (ref 4.0–10.5)
nRBC: 0 % (ref 0.0–0.2)

## 2023-04-09 LAB — BASIC METABOLIC PANEL
Anion gap: 9 (ref 5–15)
BUN: 11 mg/dL (ref 8–23)
CO2: 27 mmol/L (ref 22–32)
Calcium: 8.7 mg/dL — ABNORMAL LOW (ref 8.9–10.3)
Chloride: 104 mmol/L (ref 98–111)
Creatinine, Ser: 0.76 mg/dL (ref 0.44–1.00)
GFR, Estimated: >60 mL/min (ref >60)
Glucose, Bld: 92 mg/dL (ref 70–99)
Potassium: 3.4 mmol/L — ABNORMAL LOW (ref 3.5–5.1)
Sodium: 140 mmol/L (ref 135–145)

## 2023-04-09 LAB — PREPARE RBC (CROSSMATCH)

## 2023-04-09 MED ORDER — MORPHINE SULFATE (PF) 2 MG/ML IV SOLN
2.0000 mg | INTRAVENOUS | Status: DC | PRN
  Administered 2023-04-09 (×5): 2 mg via INTRAVENOUS
  Filled 2023-04-09 (×5): qty 1

## 2023-04-09 MED ORDER — ACETAMINOPHEN 10 MG/ML IV SOLN
1000.0000 mg | Freq: Four times a day (QID) | INTRAVENOUS | Status: DC
  Administered 2023-04-09 (×2): 1000 mg via INTRAVENOUS
  Filled 2023-04-09 (×2): qty 100

## 2023-04-09 MED ORDER — IOHEXOL 300 MG/ML  SOLN
50.0000 mL | Freq: Once | INTRAMUSCULAR | Status: DC | PRN
  Administered 2023-04-09: 75 mL via ORAL

## 2023-04-09 MED ORDER — ALPRAZOLAM 0.5 MG PO TABS
1.0000 mg | ORAL_TABLET | Freq: Three times a day (TID) | ORAL | Status: DC | PRN
  Administered 2023-04-09: 1 mg via ORAL
  Filled 2023-04-09: qty 2

## 2023-04-09 MED ORDER — SODIUM CHLORIDE 0.9% IV SOLUTION: Freq: Once | INTRAVENOUS | Status: AC

## 2023-04-09 MED ORDER — KCL IN DEXTROSE-NACL 20-5-0.9 MEQ/L-%-% IV SOLN
INTRAVENOUS | Status: DC
  Filled 2023-04-09: qty 1000

## 2023-04-09 MED ORDER — ZOLPIDEM TARTRATE 5 MG PO TABS
10.0000 mg | ORAL_TABLET | Freq: Every evening | ORAL | Status: DC | PRN
  Administered 2023-04-09: 10 mg via ORAL
  Filled 2023-04-09: qty 2

## 2023-04-09 NOTE — Progress Notes (Addendum)
 2 Days Post-Op   Subjective/Chief Complaint: Tearful, pain, pulled ng tube out overnight, no flatus voiding   Objective: Vital signs in last 24 hours: Temp:  [98.3 F (36.8 C)-98.9 F (37.2 C)] 98.6 F (37 C) (01/13 0302) Pulse Rate:  [66-84] 84 (01/13 0302) Resp:  [14-18] 18 (01/13 0302) BP: (103-116)/(63-68) 103/63 (01/13 0302) SpO2:  [91 %-97 %] 94 % (01/13 0302)    Intake/Output from previous day: 01/12 0701 - 01/13 0700 In: 1847.6 [P.O.:120; I.V.:1486.4; IV Piggyback:241.1] Out: 1245 [Urine:600; Emesis/NG output:550; Drains:95] Intake/Output this shift: Total I/O In: 0  Out: 320 [Urine:300; Drains:20]  General nad Cv regular Pulm effort normal Ab soft approp tender drain serosang, dressing intact   Lab Results:  Recent Labs    04/08/23 0511 04/09/23 0437  WBC 17.8* 10.9*  HGB 8.0* 6.8*  HCT 25.5* 21.4*  PLT 452* 403*   BMET Recent Labs    04/07/23 0756 04/09/23 0437  NA 138 140  K 3.7 3.4*  CL 105 104  CO2 25 27  GLUCOSE 95 92  BUN 20 11  CREATININE 0.58 0.76  CALCIUM 8.6* 8.7*   PT/INR No results for input(s): LABPROT, INR in the last 72 hours. ABG No results for input(s): PHART, HCO3 in the last 72 hours.  Invalid input(s): PCO2, PO2  Studies/Results: DG Abd Portable 1V Result Date: 04/07/2023 CLINICAL DATA:  Nasogastric tube placement.  Postop. EXAM: PORTABLE ABDOMEN - 1 VIEW COMPARISON:  CT earlier today FINDINGS: Tip and side port of the enteric tube below the diaphragm in the stomach. Presumed drain in the right abdomen. Midline skin staples. Lower lumbar surgical hardware. IMPRESSION: Tip and side port of the enteric tube below the diaphragm in the stomach. Electronically Signed   By: Andrea Gasman M.D.   On: 04/07/2023 16:32   CT ABDOMEN PELVIS W CONTRAST Result Date: 04/07/2023 CLINICAL DATA:  Right lower quadrant pain radiating to umbilicus for several hours. EXAM: CT ABDOMEN AND PELVIS WITH CONTRAST TECHNIQUE:  Multidetector CT imaging of the abdomen and pelvis was performed using the standard protocol following bolus administration of intravenous contrast. RADIATION DOSE REDUCTION: This exam was performed according to the departmental dose-optimization program which includes automated exposure control, adjustment of the mA and/or kV according to patient size and/or use of iterative reconstruction technique. CONTRAST:  OMNIPAQUE  IOHEXOL  300 MG/ML  SOLN COMPARISON:  11/21/2022 FINDINGS: Lower Chest: No acute findings. Hepatobiliary: A few small hepatic cysts are noted. No suspicious hepatic masses identified. Gallbladder is unremarkable. No evidence of biliary ductal dilatation. Pancreas:  No mass or inflammatory changes. Spleen: Within normal limits in size and appearance. Adrenals/Urinary Tract: No suspicious masses identified. No evidence of ureteral calculi or hydronephrosis. Stomach/Bowel: Small amount of free intraperitoneal air is seen as well as small amount of ascites. Mild-to-moderate wall thickening of the gastric antrum is seen with few adjacent extraluminal air bubbles at this site, suspicious for perforated peptic ulcer. No evidence of abscess or bowel obstruction. Vascular/Lymphatic: No pathologically enlarged lymph nodes. No acute vascular findings. Reproductive:  No mass or other significant abnormality. Other:  Small right inguinal hernia noted, which contains only fat. Musculoskeletal: No suspicious bone lesions identified. Lumbosacral spine fusion hardware again noted. Severe right hip osteoarthritis is seen. IMPRESSION: Free intraperitoneal air, with wall thickening of the gastric antrum highly suggestive of perforated peptic ulcer as the source. No evidence of abscess or bowel obstruction. Critical Value/emergent results were called by telephone at the time of interpretation on 04/07/2023 at 9:45  am to provider AMJAD ALI , who verbally acknowledged these results. Electronically Signed   By: Norleen DELENA Kil M.D.   On: 04/07/2023 09:45    Anti-infectives: Anti-infectives (From admission, onward)    Start     Dose/Rate Route Frequency Ordered Stop   04/07/23 1600  piperacillin -tazobactam (ZOSYN ) IVPB 3.375 g        3.375 g 12.5 mL/hr over 240 Minutes Intravenous Every 8 hours 04/07/23 1307 04/12/23 1559   04/07/23 1015  piperacillin -tazobactam (ZOSYN ) IVPB 3.375 g        3.375 g 100 mL/hr over 30 Minutes Intravenous  Once 04/07/23 1014 04/07/23 1050       Assessment/Plan: POD 2 graham patch ulcer, biopsy -npo, pulled ng tube, will check ugi today and hopefully give her all po meds -zosyn  3/5 -protonix  bid -oob, pulm toilet -hb decreased I think equilibration more than anything as she came in quite low (this will need to be evaluated still as outpatient). Will do prbc today, continue sq heparin   Donnice Bury 04/09/2023

## 2023-04-09 NOTE — Progress Notes (Signed)
 3 pills were found in the patients bed earlier this afternoon by Darice NT. Patient's belongings were searched by the CN, Cristy, to gather any other medications in the patient's possession. Security was called to help search because patient refused to let us  look in her purse. She agreed after security showed up. All medications were counted and taken to the Pharmacy for storage.

## 2023-04-09 NOTE — Progress Notes (Signed)
 Date and time results received: 04/09/23  at 0335  Test:   Hgb  Critical Value: Hgb-6.8  Name of Provider Notified: Paged Dr Sophronia Simas.[phone]  Orders Received? Or Actions Taken?: waiting for MD's response.

## 2023-04-09 NOTE — TOC CM/SW Note (Signed)
 Transition of Care [facility] - Inpatient Brief Assessment   Patient Details  Name: Kara [name] MRN: [id number] Date of Birth: 1955/06/04  [facility] CM/SW Contact:    Alfonse JONELLE Rex, RN Phone Number: 04/09/2023, 11:48 AM   Clinical Narrative: Met with pt at bedside to introduce role of TOC/NCM and review for dc planning, pt reports she lives in an apartment, her dtr currently lives with her, her son lives locally and assist as needed, reports no current home care services or home DME, confirmed transportation is available at discharge. TOC Brief Assessment completed. No TOC needs identified at this time.     Transition of Care Asessment: Insurance and Status: Insurance coverage has been reviewed Patient has primary care physician: Yes Home environment has been reviewed: resides in an apartment Prior level of function:: Independent Prior/Current Home Services: No current home services Social Drivers of Health Review: SDOH reviewed no interventions necessary Readmission risk has been reviewed: Yes Transition of care needs: no transition of care needs at this time

## 2023-04-09 NOTE — Progress Notes (Signed)
 Patient has been very anxious and constantly yelling. Gave meds for anxiety and sleep, didn't help. On-call provider Dr. Rubin aware. Patient wanted to leave AMA, this nurse tried to educate her on the need of IV antibiotics. Patient kept yelling refused education. Dr. Rubin aware. Patient signed AMA form and left. IV removed. She had some home meds stored in the pharmacy, she refused to get those medications back. She verbalized that she will stop by the hospital and get her meds. Her meds are stored in the hospital pharmacy.

## 2023-04-09 NOTE — Plan of Care (Signed)
 ?  Problem: Clinical Measurements: ?Goal: Will remain free from infection ?Outcome: Progressing ?  ?

## 2023-04-09 NOTE — Progress Notes (Signed)
 Patient was screaming for pain medicine after 15-20 minutes of she received her IV dilaudid  at 0106. This RN mentioned to pt, just received dilaudid  not long ago. Trying to explained patient let that medicine to work, then patient verbally and physically aggressive . Patient was very angry as well. She was  blaming  this RN did not give her medicine  , this nurse was injecting medicine to somebody else so she did not received the medication. Patient wanted to talk to charge nurse. CN went to bedside and talked to patient. Notified to security and [facility]. Pt demanding to call Dr to come at the bedside. Notified to patient, Dr won't come at the bedside at middle of the night unless of emergency. Offered IV ativan  but pt refused to take ativan  she said that she is allergic with ativan . Pt trying to pull her NG tube and J.P. drain, educated importance of those lines and tubes. AC came at the bedside to speak with the patient regarding her concerns. AC advised this RN to have witness every time I go to her room.

## 2023-04-09 NOTE — Progress Notes (Signed)
 Pt's NG tube came out this morning around 6.50am. patient said that NG tube came out when she sneezed. Notified to on-call MD. Ordered leave NG tube out for now. No acute distress. Will continue to monitor.

## 2023-04-10 ENCOUNTER — Inpatient Hospital Stay (HOSPITAL_COMMUNITY)
Admission: EM | Admit: 2023-04-10 | Discharge: 2023-04-11 | DRG: 948 | Disposition: A | Discharge status: Home / Self Care | Payer: Medicare Other | Attending: General Surgery | Admitting: General Surgery

## 2023-04-10 ENCOUNTER — Emergency Department (HOSPITAL_COMMUNITY): Payer: Medicare Other

## 2023-04-10 ENCOUNTER — Encounter (HOSPITAL_COMMUNITY): Payer: Self-pay | Admitting: Emergency Medicine

## 2023-04-10 ENCOUNTER — Other Ambulatory Visit: Payer: Self-pay

## 2023-04-10 DIAGNOSIS — Z825 Family history of asthma and other chronic lower respiratory diseases: Secondary | ICD-10-CM

## 2023-04-10 DIAGNOSIS — D649 Anemia, unspecified: Secondary | ICD-10-CM | POA: Diagnosis present

## 2023-04-10 DIAGNOSIS — Z807 Family history of other malignant neoplasms of lymphoid, hematopoietic and related tissues: Secondary | ICD-10-CM

## 2023-04-10 DIAGNOSIS — Z818 Family history of other mental and behavioral disorders: Secondary | ICD-10-CM

## 2023-04-10 DIAGNOSIS — Z981 Arthrodesis status: Secondary | ICD-10-CM

## 2023-04-10 DIAGNOSIS — Z803 Family history of malignant neoplasm of breast: Secondary | ICD-10-CM | POA: Diagnosis not present

## 2023-04-10 DIAGNOSIS — Z8249 Family history of ischemic heart disease and other diseases of the circulatory system: Secondary | ICD-10-CM

## 2023-04-10 DIAGNOSIS — Z800 Family history of malignant neoplasm of digestive organs: Secondary | ICD-10-CM | POA: Diagnosis not present

## 2023-04-10 DIAGNOSIS — F329 Major depressive disorder, single episode, unspecified: Secondary | ICD-10-CM | POA: Diagnosis present

## 2023-04-10 DIAGNOSIS — Z888 Allergy status to other drugs, medicaments and biological substances status: Secondary | ICD-10-CM | POA: Diagnosis not present

## 2023-04-10 DIAGNOSIS — Z87891 Personal history of nicotine dependence: Secondary | ICD-10-CM

## 2023-04-10 DIAGNOSIS — Z96611 Presence of right artificial shoulder joint: Secondary | ICD-10-CM | POA: Diagnosis present

## 2023-04-10 DIAGNOSIS — Z8261 Family history of arthritis: Secondary | ICD-10-CM | POA: Diagnosis not present

## 2023-04-10 DIAGNOSIS — I10 Essential (primary) hypertension: Secondary | ICD-10-CM | POA: Diagnosis present

## 2023-04-10 DIAGNOSIS — M797 Fibromyalgia: Secondary | ICD-10-CM | POA: Diagnosis present

## 2023-04-10 DIAGNOSIS — F419 Anxiety disorder, unspecified: Secondary | ICD-10-CM | POA: Diagnosis present

## 2023-04-10 DIAGNOSIS — Z811 Family history of alcohol abuse and dependence: Secondary | ICD-10-CM | POA: Diagnosis not present

## 2023-04-10 DIAGNOSIS — G8918 Other acute postprocedural pain: Secondary | ICD-10-CM | POA: Diagnosis present

## 2023-04-10 DIAGNOSIS — Z8711 Personal history of peptic ulcer disease: Secondary | ICD-10-CM

## 2023-04-10 DIAGNOSIS — Z886 Allergy status to analgesic agent status: Secondary | ICD-10-CM

## 2023-04-10 DIAGNOSIS — K275 Chronic or unspecified peptic ulcer, site unspecified, with perforation: Secondary | ICD-10-CM | POA: Diagnosis present

## 2023-04-10 DIAGNOSIS — Z79899 Other long term (current) drug therapy: Secondary | ICD-10-CM | POA: Diagnosis not present

## 2023-04-10 DIAGNOSIS — Z87442 Personal history of urinary calculi: Secondary | ICD-10-CM

## 2023-04-10 DIAGNOSIS — Z91030 Bee allergy status: Secondary | ICD-10-CM | POA: Diagnosis not present

## 2023-04-10 LAB — URINALYSIS, ROUTINE W REFLEX MICROSCOPIC
Bilirubin Urine: NEGATIVE
Glucose, UA: NEGATIVE mg/dL
Hgb urine dipstick: NEGATIVE
Ketones, ur: 5 mg/dL — AB
Leukocytes,Ua: NEGATIVE
Nitrite: NEGATIVE
Protein, ur: NEGATIVE mg/dL
Specific Gravity, Urine: 1.023 (ref 1.005–1.030)
pH: 7 (ref 5.0–8.0)

## 2023-04-10 LAB — COMPREHENSIVE METABOLIC PANEL
ALT: 13 U/L (ref 0–44)
AST: 27 U/L (ref 15–41)
Albumin: 3.4 g/dL — ABNORMAL LOW (ref 3.5–5.0)
Alkaline Phosphatase: 83 U/L (ref 38–126)
Anion gap: 14 (ref 5–15)
BUN: 6 mg/dL — ABNORMAL LOW (ref 8–23)
CO2: 22 mmol/L (ref 22–32)
Calcium: 9.2 mg/dL (ref 8.9–10.3)
Chloride: 101 mmol/L (ref 98–111)
Creatinine, Ser: 0.65 mg/dL (ref 0.44–1.00)
GFR, Estimated: >60 mL/min (ref >60)
Glucose, Bld: 99 mg/dL (ref 70–99)
Potassium: 3.4 mmol/L — ABNORMAL LOW (ref 3.5–5.1)
Sodium: 137 mmol/L (ref 135–145)
Total Bilirubin: 0.5 mg/dL (ref 0.0–1.2)
Total Protein: 7.1 g/dL (ref 6.5–8.1)

## 2023-04-10 LAB — CBC
HCT: 30.3 % — ABNORMAL LOW (ref 36.0–46.0)
HCT: 26.7 % — ABNORMAL LOW (ref 36.0–46.0)
Hemoglobin: 9.9 g/dL — ABNORMAL LOW (ref 12.0–15.0)
Hemoglobin: 8.6 g/dL — ABNORMAL LOW (ref 12.0–15.0)
MCH: 28.7 pg (ref 26.0–34.0)
MCH: 28.7 pg (ref 26.0–34.0)
MCHC: 32.7 g/dL (ref 30.0–36.0)
MCHC: 32.2 g/dL (ref 30.0–36.0)
MCV: 87.8 fL (ref 80.0–100.0)
MCV: 89 fL (ref 80.0–100.0)
Platelets: 566 10*3/uL — ABNORMAL HIGH (ref 150–400)
Platelets: 455 10*3/uL — ABNORMAL HIGH (ref 150–400)
RBC: 3.45 MIL/uL — ABNORMAL LOW (ref 3.87–5.11)
RBC: 3 MIL/uL — ABNORMAL LOW (ref 3.87–5.11)
RDW: 14.8 % (ref 11.5–15.5)
RDW: 14.7 % (ref 11.5–15.5)
WBC: 15.6 10*3/uL — ABNORMAL HIGH (ref 4.0–10.5)
WBC: 12.5 10*3/uL — ABNORMAL HIGH (ref 4.0–10.5)
nRBC: 0 % (ref 0.0–0.2)
nRBC: 0 % (ref 0.0–0.2)

## 2023-04-10 LAB — CREATININE, SERUM
Creatinine, Ser: 0.49 mg/dL (ref 0.44–1.00)
GFR, Estimated: >60 mL/min (ref >60)

## 2023-04-10 LAB — TYPE AND SCREEN
ABO/RH(D): O NEG
Antibody Screen: NEGATIVE
Unit division: 0

## 2023-04-10 LAB — RAPID URINE DRUG SCREEN, HOSP PERFORMED
Amphetamines: NOT DETECTED
Barbiturates: NOT DETECTED
Benzodiazepines: POSITIVE — AB
Cocaine: NOT DETECTED
Opiates: POSITIVE — AB
Tetrahydrocannabinol: NOT DETECTED

## 2023-04-10 LAB — BPAM RBC
Blood Product Expiration Date: 202502172359
ISSUE DATE / TIME: 202501131137
Unit Type and Rh: 9500

## 2023-04-10 LAB — LIPASE, BLOOD: Lipase: 22 U/L (ref 11–51)

## 2023-04-10 MED ORDER — PIPERACILLIN-TAZOBACTAM 3.375 G IVPB
3.3750 g | Freq: Three times a day (TID) | INTRAVENOUS | Status: DC
Start: 2023-04-10 — End: 2023-04-11
  Administered 2023-04-10 – 2023-04-11 (×3): 3.375 g via INTRAVENOUS
  Filled 2023-04-10 (×3): qty 50

## 2023-04-10 MED ORDER — AMPHETAMINE-DEXTROAMPHET ER 10 MG PO CP24
50.0000 mg | ORAL_CAPSULE | Freq: Every morning | ORAL | Status: DC
Start: 2023-04-10 — End: 2023-04-11
  Administered 2023-04-10 – 2023-04-11 (×2): 50 mg via ORAL
  Filled 2023-04-10 (×2): qty 5

## 2023-04-10 MED ORDER — PIPERACILLIN-TAZOBACTAM 3.375 G IVPB 30 MIN
3.3750 g | INTRAVENOUS | Status: AC
  Administered 2023-04-10: 3.375 g via INTRAVENOUS
  Filled 2023-04-10: qty 50

## 2023-04-10 MED ORDER — ACETAMINOPHEN 500 MG PO TABS
1000.0000 mg | ORAL_TABLET | Freq: Four times a day (QID) | ORAL | Status: DC
  Administered 2023-04-10 – 2023-04-11 (×4): 1000 mg via ORAL
  Filled 2023-04-10 (×5): qty 2

## 2023-04-10 MED ORDER — HALOPERIDOL LACTATE 5 MG/ML IJ SOLN
5.0000 mg | Freq: Once | INTRAMUSCULAR | Status: AC
  Administered 2023-04-10: 5 mg via INTRAMUSCULAR
  Filled 2023-04-10: qty 1

## 2023-04-10 MED ORDER — ALPRAZOLAM 0.5 MG PO TABS
1.0000 mg | ORAL_TABLET | Freq: Two times a day (BID) | ORAL | Status: DC
  Administered 2023-04-10 – 2023-04-11 (×3): 1 mg via ORAL
  Filled 2023-04-10 (×3): qty 2

## 2023-04-10 MED ORDER — SIMETHICONE 80 MG PO CHEW: 40.0000 mg | CHEWABLE_TABLET | Freq: Four times a day (QID) | ORAL | Status: DC | PRN

## 2023-04-10 MED ORDER — ACETAMINOPHEN 325 MG PO TABS
650.0000 mg | ORAL_TABLET | Freq: Once | ORAL | Status: AC
  Administered 2023-04-10: 650 mg via ORAL
  Filled 2023-04-10: qty 2

## 2023-04-10 MED ORDER — VENLAFAXINE HCL 75 MG PO TABS
75.0000 mg | ORAL_TABLET | Freq: Every morning | ORAL | Status: DC
  Administered 2023-04-10 – 2023-04-11 (×2): 75 mg via ORAL
  Filled 2023-04-10 (×2): qty 1

## 2023-04-10 MED ORDER — METHOCARBAMOL 500 MG PO TABS: 500.0000 mg | ORAL_TABLET | Freq: Three times a day (TID) | ORAL | Status: DC | PRN

## 2023-04-10 MED ORDER — HYDROMORPHONE HCL 1 MG/ML IJ SOLN
1.0000 mg | INTRAMUSCULAR | Status: DC | PRN
  Administered 2023-04-10: 1 mg via INTRAVENOUS
  Filled 2023-04-10: qty 1

## 2023-04-10 MED ORDER — MORPHINE SULFATE (PF) 2 MG/ML IV SOLN
2.0000 mg | INTRAVENOUS | Status: DC | PRN
  Administered 2023-04-10: 2 mg via INTRAVENOUS
  Filled 2023-04-10: qty 1

## 2023-04-10 MED ORDER — ONDANSETRON 8 MG PO TBDP
8.0000 mg | ORAL_TABLET | Freq: Once | ORAL | Status: AC
  Administered 2023-04-10: 8 mg via ORAL
  Filled 2023-04-10: qty 1

## 2023-04-10 MED ORDER — ONDANSETRON 4 MG PO TBDP: 4.0000 mg | ORAL_TABLET | Freq: Four times a day (QID) | ORAL | Status: DC | PRN

## 2023-04-10 MED ORDER — AMPHETAMINE-DEXTROAMPHET ER 25 MG PO CP24: 25.0000 mg | ORAL_CAPSULE | Freq: Two times a day (BID) | ORAL | Status: DC

## 2023-04-10 MED ORDER — ONDANSETRON HCL 4 MG/2ML IJ SOLN: 4.0000 mg | Freq: Four times a day (QID) | INTRAMUSCULAR | Status: DC | PRN

## 2023-04-10 MED ORDER — HYDROCODONE-ACETAMINOPHEN 5-325 MG PO TABS
1.0000 | ORAL_TABLET | Freq: Once | ORAL | Status: AC
  Administered 2023-04-10: 1 via ORAL
  Filled 2023-04-10: qty 1

## 2023-04-10 MED ORDER — IOHEXOL 300 MG/ML  SOLN
100.0000 mL | Freq: Once | INTRAMUSCULAR | Status: AC | PRN
  Administered 2023-04-10: 100 mL via INTRAVENOUS

## 2023-04-10 MED ORDER — AMPHETAMINE-DEXTROAMPHET ER 30 MG PO CP24
50.0000 mg | ORAL_CAPSULE | Freq: Every morning | ORAL | Status: DC
  Filled 2023-04-10: qty 1

## 2023-04-10 MED ORDER — HEPARIN SODIUM (PORCINE) 5000 UNIT/ML IJ SOLN
5000.0000 [IU] | Freq: Three times a day (TID) | INTRAMUSCULAR | Status: DC
  Administered 2023-04-10 (×3): 5000 [IU] via SUBCUTANEOUS
  Filled 2023-04-10 (×3): qty 1

## 2023-04-10 MED ORDER — OXYCODONE HCL 5 MG PO TABS
5.0000 mg | ORAL_TABLET | ORAL | Status: DC | PRN
  Administered 2023-04-10 – 2023-04-11 (×5): 5 mg via ORAL
  Filled 2023-04-10 (×5): qty 1

## 2023-04-10 MED ORDER — ZOLPIDEM TARTRATE 5 MG PO TABS
10.0000 mg | ORAL_TABLET | Freq: Every evening | ORAL | Status: DC | PRN
  Administered 2023-04-10: 10 mg via ORAL
  Filled 2023-04-10: qty 2

## 2023-04-10 MED ORDER — ENSURE ENLIVE PO LIQD
237.0000 mL | Freq: Two times a day (BID) | ORAL | Status: DC
  Administered 2023-04-10 – 2023-04-11 (×2): 237 mL via ORAL

## 2023-04-10 MED ORDER — PANTOPRAZOLE SODIUM 40 MG IV SOLR
40.0000 mg | Freq: Two times a day (BID) | INTRAVENOUS | Status: DC
  Administered 2023-04-10 – 2023-04-11 (×3): 40 mg via INTRAVENOUS
  Filled 2023-04-10 (×3): qty 10

## 2023-04-10 MED ORDER — METHOCARBAMOL 1000 MG/10ML IJ SOLN: 500.0000 mg | Freq: Three times a day (TID) | INTRAMUSCULAR | Status: DC | PRN

## 2023-04-10 NOTE — ED Provider Triage Note (Signed)
 Emergency Medicine Provider Triage Evaluation Note  Kara [name] , a 68 y.o. female  was evaluated in triage.  Pt complains of abdominal pain.  Had exploratory laparotomy surgery on 1/11 for perforated gastric ulcer.  States her pain is 10 out of 10.  She does have a JP drain in place.  States she left AMA due to a family emergency.  Review of Systems  Positive: See above Negative: See above  Physical Exam  BP (!) 147/112 (BP Location: Left Arm)   Pulse 87   Temp 98.4 F (36.9 C) (Oral)   Resp 20   Wt 59 kg   SpO2 100%   BMI 22.31 kg/m  Gen:   Awake, no distress   Resp:  Normal effort  MSK:   Moves extremities without difficulty  Other:   Medical Decision Making  Medically screening exam initiated at 1:49 AM.  Appropriate orders placed.  Miia Blanks was informed that the remainder of the evaluation will be completed by another provider, this initial triage assessment does not replace that evaluation, and the importance of remaining in the ED until their evaluation is complete.  Work up started   Lang Norleen POUR, PA-C 04/10/23 0154

## 2023-04-10 NOTE — ED Notes (Signed)
 ED TO INPATIENT HANDOFF REPORT  ED Nurse Name and Phone #: Olen, RN [phone]  S Name/Age/Gender Kara [name] 68 y.o. female Room/Bed: WA05/WA05  Code Status   Code Status: Full Code  Home/SNF/Other Home Patient oriented to: self, place, time, and situation Is this baseline? Yes   Triage Complete: Triage complete  Chief Complaint Perforated peptic ulcer (HCC) [K27.5]  Triage Note Pt in with abdominal pain, had exploratory lap surgery 1/11 for perforated gastric ulcer. Arrives in 10/10 pain and grasping abdomen, has JP drain that she states is putting out minimal output.    Allergies Allergies  Allergen Reactions   Aspirin Swelling    Throat swells   Bee Venom Anaphylaxis   Ibuprofen Swelling    Throat swells   Ativan  [Lorazepam ] Other (See Comments)    Agitation/confusion    Ketamine  Other (See Comments)    Hallucinations   Toradol  [Ketorolac  Tromethamine ] Itching, Swelling and Rash   Tramadol Hcl Itching, Swelling and Rash    Tolerates Dilaudid  06/2016.  TDD.    Level of Care/Admitting Diagnosis ED Disposition     ED Disposition  Admit   Condition  --   Comment  Hospital Area: [facility] [[id number]]  Level of Care: Med-Surg [16]  May admit patient to [facility] or [facility] if equivalent level of care is available:: No  Covid Evaluation: Asymptomatic - no recent exposure (last 10 days) testing not required  Diagnosis: Perforated peptic ulcer [facility]) [[id number]]  Admitting Physician: CCS, MD [[id number]]  Attending Physician: CCS, MD [[id number]]  Certification:: I certify this patient will need inpatient services for at least 2 midnights  Expected Medical Readiness: 04/13/2023          B Medical/Surgery History Past Medical History:  Diagnosis Date   ADD (attention deficit disorder)    on Adderal   ADD (attention deficit disorder)    Aggressive behavior of adult    Anemia    Anxiety    Cellulitis of breast 11/2013   RIGHT BREAST   Childhood  asthma    as child   Chronic fatigue and immune dysfunction syndrome (HCC)    Chronic lower back pain    Chronic pain    went to [facility] for pain control; stopped in 2016  (02/23/2017)   Cold sore    Complication of anesthesia    Ketamine  makes her hallucinate   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   Post-nasal drip    hx of   Pre-diabetes    patient states she is not Pre-Diabetic at appt. 12/16/2018   PTSD (post-traumatic stress disorder)    RA (rheumatoid arthritis) (HCC)    autoimmune arthritis   RA (rheumatoid arthritis) (HCC)    Spondylitis (HCC)    Suicidal intent    Past Surgical History:  Procedure Laterality Date   ANTERIOR CERVICAL DECOMP/DISCECTOMY FUSION N/A 03/15/2015   Procedure: Cervical five-six, Cerival six-seven, Anterior Cervical Discectomy and Fusion, Allograft and Plate;  Surgeon: Oneil JAYSON Herald, MD;  Location: [facility]  OR;  Service: Orthopedics;  Laterality: N/A;   ANTERIOR LAT LUMBAR FUSION Left 06/11/2018   Procedure: Left Lumbar two-three Anterolateral decompression/fusion/lateral plate fixation;  Surgeon: Colon Shove, MD;  Location: [facility] OR;  Service: Neurosurgery;  Laterality: Left;  Left Lumbar two-three Anterolateral decompression/fusion/lateral plate fixation   AUGMENTATION MAMMAPLASTY  2003   BACK SURGERY     BLADDER SUSPENSION  2009   BONE EXCISION Right 11/26/2018   Procedure: TRAPEZIUM EXCISION RIGHT THUMB;  Surgeon: Murrell Kuba, MD;  Location: [facility];  Service: Orthopedics;  Laterality: Right;  AXILLARY BLOCK   BREAST IMPLANT REMOVAL  Bilateral 10/2013   CARPOMETACARPEL SUSPENSION PLASTY Right 11/26/2018   Procedure: BERNEDA SUSPENSION;  Surgeon: Murrell Kuba, MD;  Location: [facility];  Service: Orthopedics;  Laterality: Right;   CERVICAL WOUND DEBRIDEMENT N/A 07/07/2016   Procedure: IRRIGATION AND DEBRIDEMENT POSTERIOR NECK;  Surgeon: Oneil JAYSON Herald, MD;  Location: [facility] OR;  Service: Orthopedics;  Laterality: N/A;   COLONOSCOPY     COMBINED ABDOMINOPLASTY AND LIPOSUCTION  2003   INCISION AND DRAINAGE ABSCESS Right 01/16/2014   Procedure: INCISION AND DRAINAGE AND OF RIGHT BREAST ABCESS;  Surgeon: Morene Olives, MD;  Location: [facility] ORS;  Service: General;  Laterality: Right;   JOINT REPLACEMENT     LAPAROTOMY N/A 04/07/2023   Procedure: EXPLORATORY LAPAROTOMY; ARLYSS LISLE;  Surgeon: Ebbie Cough, MD;  Location: [facility] ORS;  Service: General;  Laterality: N/A;   LUMBAR LAMINECTOMY/DECOMPRESSION MICRODISCECTOMY N/A 12/10/2015   Procedure: Right L3-4 Hemilaminectomy, Excision of herniated nucleus pulposus;  Surgeon: Oneil JAYSON Herald, MD;  Location: [facility] OR;  Service: Orthopedics;  Laterality: N/A;   MASS EXCISION  11/03/2011   Procedure: MINOR EXCISION OF MASS;  Surgeon: Lamar LULLA Leonor Mickey., MD;  Location: [facility];  Service: Orthopedics;  Laterality: Left;  debride IP joint, cyst excision left index   MAXIMUM ACCESS (MAS) TRANSFORAMINAL LUMBAR INTERBODY FUSION (TLIF) 2 LEVEL Right 02/23/2017   POSTERIOR CERVICAL FUSION/FORAMINOTOMY N/A 06/21/2016   Procedure: POSTERIOR CERVICAL FUSION C5-C7 SPINOUS PROCESS WIRING;  Surgeon: Herald Oneil JAYSON, MD;  Location: [facility] OR;  Service: Orthopedics;  Laterality: N/A;   POSTERIOR LUMBAR FUSION  07/2009; 07/03/2014   L4-5; L5-S1   SHOULDER ARTHROSCOPY Right 2012   TENDON TRANSFER Right 11/26/2018   Procedure: TENDON TRANSFER;  Surgeon: Murrell Kuba, MD;  Location: [facility];  Service: Orthopedics;  Laterality: Right;   TONSILLECTOMY  1987   TOTAL SHOULDER  ARTHROPLASTY  08/01/2011   Procedure: TOTAL SHOULDER ARTHROPLASTY;  Surgeon: Fonda SHAUNNA Olmsted, MD;  Location: [facility] OR;  Service: Orthopedics;  Laterality: Right;  Right total shoulder arthroplasty   TUBAL LIGATION  1988     A IV Location/Drains/Wounds Patient Lines/Drains/Airways Status     Active Line/Drains/Airways     Name Placement date Placement time Site Days   Peripheral IV 04/10/23 20 G Anterior;Right;Upper Arm 04/10/23  0213  Arm  less than 1   Peripheral IV 04/10/23 20 G 1 Left;Posterior Forearm 04/10/23  0830  Forearm  less than 1   Closed System Drain 1 Midline Abdomen Bulb (JP) 19 Fr. 04/07/23  1227  Abdomen  3   Incision (Closed) 04/07/23 Abdomen Other (Comment) 04/07/23  1240  -- 3            Intake/Output Last 24 hours  Intake/Output Summary (Last 24 hours) at 04/10/2023 0928 Last data filed at 04/10/2023 0903 Gross per 24 hour  Intake 41.25 ml  Output --  Net  41.25 ml    Labs/Imaging Results for orders placed or performed during the hospital encounter of 04/10/23 (from the past 48 hours)  Lipase, blood     Status: None   Collection Time: 04/10/23  2:14 AM  Result Value Ref Range   Lipase 22 11 - 51 U/L    Comment: Performed at [facility], [address] [place], [place] [address]  Comprehensive metabolic panel     Status: Abnormal   Collection Time: 04/10/23  2:14 AM  Result Value Ref Range   Sodium 137 135 - 145 mmol/L   Potassium 3.4 (L) 3.5 - 5.1 mmol/L   Chloride 101 98 - 111 mmol/L   CO2 22 22 - 32 mmol/L   Glucose, Bld 99 70 - 99 mg/dL    Comment: Glucose reference range applies only to samples taken after fasting for at least 8 hours.   BUN 6 (L) 8 - 23 mg/dL   Creatinine, Ser 9.34 0.44 - 1.00 mg/dL   Calcium 9.2 8.9 - 89.6 mg/dL   Total Protein 7.1 6.5 - 8.1 g/dL   Albumin  3.4 (L) 3.5 - 5.0 g/dL   AST 27 15 - 41 U/L   ALT 13 0 - 44 U/L   Alkaline Phosphatase 83 38 - 126 U/L   Total Bilirubin 0.5 0.0 - 1.2 mg/dL   GFR,  Estimated >39 >39 mL/min    Comment: (NOTE) Calculated using the CKD-EPI Creatinine Equation (2021)    Anion gap 14 5 - 15    Comment: Performed at [facility], [address] [place], [place] [address]  CBC     Status: Abnormal   Collection Time: 04/10/23  2:14 AM  Result Value Ref Range   WBC 15.6 (H) 4.0 - 10.5 K/uL   RBC 3.45 (L) 3.87 - 5.11 MIL/uL   Hemoglobin 9.9 (L) 12.0 - 15.0 g/dL    Comment: REPEATED TO VERIFY POST TRANSFUSION SPECIMEN    HCT 30.3 (L) 36.0 - 46.0 %   MCV 87.8 80.0 - 100.0 fL   MCH 28.7 26.0 - 34.0 pg   MCHC 32.7 30.0 - 36.0 g/dL   RDW 85.1 88.4 - 84.4 %   Platelets 566 (H) 150 - 400 K/uL   nRBC 0.0 0.0 - 0.2 %    Comment: Performed at [facility], [address] [place], [place] [address]  Urinalysis, Routine w reflex microscopic -Urine, Clean Catch     Status: Abnormal   Collection Time: 04/10/23  7:18 AM  Result Value Ref Range   Color, Urine COLORLESS (A) YELLOW   APPearance CLEAR CLEAR   Specific Gravity, Urine 1.023 1.005 - 1.030   pH 7.0 5.0 - 8.0   Glucose, UA NEGATIVE NEGATIVE mg/dL   Hgb urine dipstick NEGATIVE NEGATIVE   Bilirubin Urine NEGATIVE NEGATIVE   Ketones, ur 5 (A) NEGATIVE mg/dL   Protein, ur NEGATIVE NEGATIVE mg/dL   Nitrite NEGATIVE NEGATIVE   Leukocytes,Ua NEGATIVE NEGATIVE    Comment: Performed at [facility], [address], [place], [place] [address]  Urine rapid drug screen (hosp performed)     Status: Abnormal   Collection Time: 04/10/23  7:18 AM  Result Value Ref Range   Opiates POSITIVE (A) NONE DETECTED   Cocaine NONE DETECTED NONE DETECTED   Benzodiazepines POSITIVE (A) NONE DETECTED   Amphetamines NONE DETECTED NONE DETECTED   Tetrahydrocannabinol NONE DETECTED NONE DETECTED   Barbiturates NONE DETECTED NONE DETECTED  Comment: (NOTE) DRUG SCREEN FOR MEDICAL PURPOSES ONLY.  IF CONFIRMATION IS NEEDED FOR ANY PURPOSE, NOTIFY LAB WITHIN 5  DAYS.  LOWEST DETECTABLE LIMITS FOR URINE DRUG SCREEN Drug Class                     Cutoff (ng/mL) Amphetamine  and metabolites    1000 Barbiturate and metabolites    200 Benzodiazepine                 200 Opiates and metabolites        300 Cocaine and metabolites        300 THC                            50 Performed at [facility], [address] [place], [place] [address]   CBC     Status: Abnormal   Collection Time: 04/10/23  7:18 AM  Result Value Ref Range   WBC 12.5 (H) 4.0 - 10.5 K/uL   RBC 3.00 (L) 3.87 - 5.11 MIL/uL   Hemoglobin 8.6 (L) 12.0 - 15.0 g/dL   HCT 73.2 (L) 63.9 - 53.9 %   MCV 89.0 80.0 - 100.0 fL   MCH 28.7 26.0 - 34.0 pg   MCHC 32.2 30.0 - 36.0 g/dL   RDW 85.2 88.4 - 84.4 %   Platelets 455 (H) 150 - 400 K/uL   nRBC 0.0 0.0 - 0.2 %    Comment: Performed at [facility], [address] [place], [place] [address]  Creatinine, serum     Status: None   Collection Time: 04/10/23  7:18 AM  Result Value Ref Range   Creatinine, Ser 0.49 0.44 - 1.00 mg/dL   GFR, Estimated >39 >39 mL/min    Comment: (NOTE) Calculated using the CKD-EPI Creatinine Equation (2021) Performed at [facility], [address], [place], [place] [address]    CT ABDOMEN PELVIS W CONTRAST Result Date: 04/10/2023 CLINICAL DATA:  Postop abdominal pain EXAM: CT ABDOMEN AND PELVIS WITH CONTRAST TECHNIQUE: Multidetector CT imaging of the abdomen and pelvis was performed using the standard protocol following bolus administration of intravenous contrast. RADIATION DOSE REDUCTION: This exam was performed according to the departmental dose-optimization program which includes automated exposure control, adjustment of the mA and/or kV according to patient size and/or use of iterative reconstruction technique. CONTRAST:  OMNIPAQUE  IOHEXOL  300 MG/ML  SOLN COMPARISON:  04/07/2023 FINDINGS: Lower chest:  Trace layering pleural fluid.  Hepatobiliary: 7 mm cystic density in the left lobe liver. 18 mm cystic density in the right lobe liver. Full gallbladder. No biliary duct dilatation or calcified stone. Pancreas: Generalized atrophy. Spleen: Calcified nodule in the central spleen. Adrenals/Urinary Tract: Negative adrenals. No hydronephrosis or stone. Small volume gas in the urinary bladder which may be related to recent surgery and catheterization. No associated bladder wall thickening or visible debris. Stomach/Bowel: Interval repair of perforated peptic ulcer, reportedly with Arlyss patch 04/07/2023. Regressed thickening at of the distal stomach, no regional fluid collection or significant residual pneumoperitoneum. A drain traverses the operative region from right upper quadrant approach. No ileus or obstruction. Vascular/Lymphatic: No acute vascular abnormality. No mass or adenopathy. Reproductive:No pathologic findings. Other: Treated pneumoperitoneum.  No ascites or fluid collection. 3 distinct hernias in the left lateral flank, the most superior containing proximal descending colonic wall since prior, but no colonic wall thickening. There is a fatty hernia in the  right groin as well. Expected findings of recent laparotomy without abdominal wall fluid collection. Musculoskeletal: Transitional lumbosacral vertebra numbered S1. L2-S1 fusion with no bridging bone seen at L2-3 and L3-4. The open levels show prominent disc and facet degeneration narrowing the spinal canal and foramina. IMPRESSION: 1. No unexpected finding after gastric ulcer repair. No ileus or collection. 2. Three hernias along the left lateral flank, the most superior containing descending colon. There is also a fatty right groin hernia. Electronically Signed   By: Dorn Roulette M.D.   On: 04/10/2023 04:29   DG UGI W SINGLE CM (SOL OR THIN BA) Result Date: 04/09/2023 CLINICAL DATA:  Provided history: Perforated peptic ulcer. Additional history obtained from electronic  MEDICAL RECORD NUMBERExploratory laparotomy with Arlyss patch repair of a perforated pre-pyloric ulcer on 04/07/2023. EXAM: WATER  SOLUBLE UPPER GI SERIES TECHNIQUE: After a scout radiograph of the abdomen was acquired, a problem-oriented upper GI series was performed to assess for post-operative leak or obstruction. Water -soluble contrast was used (Omnipaque  300). CONTRAST:  75 mL Omnipaque  300 contrast. COMPARISON:  CT abdomen/pelvis 04/07/2023. FLUOROSCOPY: Radiation Exposure Index (as provided by the fluoroscopic device): 46.60 mGy Kerma FINDINGS: A scout radiograph of the abdomen was acquired. Surgical drain present with tip projecting at the level of the left upper quadrant. Nonspecific gaseous distension of the cecum/ascending colon and of scattered loops of small bowel. Postoperative changes from prior lumbar spinal fusion. Midline skin staples. Subsequently, a problem-oriented upper GI series was performed to assess for leak or obstruction status post Arlyss patch repair of a perforated pre-pyloric ulcer (using water -soluble contrast). Prompt passage of contrast from the esophagus into the stomach and into the proximal small bowel. No extraluminal contrast demonstrated to suggest a contrast leak. Moderate esophageal dysmotility with tertiary contractions. IMPRESSION: 1. Problem-oriented examination demonstrating no evidence of leak or obstruction status post Arlyss patch repair of a perforated pre-pyloric ulcer. 2. Moderate esophageal dysmotility with tertiary contractions. Electronically Signed   By: Rockey Childs D.O.   On: 04/09/2023 17:40    Pending Labs Unresulted Labs (From admission, onward)     Start     Ordered   04/11/23 0500  Basic metabolic panel  Tomorrow morning,   R        04/10/23 0707   04/11/23 0500  CBC  Tomorrow morning,   R        04/10/23 0707            Vitals/Pain Today's Vitals   04/10/23 0712 04/10/23 0725 04/10/23 0756 04/10/23 0925  BP:      Pulse:      Resp:       Temp:    97.8 F (36.6 C)  TempSrc:      SpO2:      Weight:      PainSc: 8  8  6       Isolation Precautions No active isolations  Medications Medications  heparin  injection 5,000 Units (5,000 Units Subcutaneous Given 04/10/23 0724)  feeding supplement (ENSURE ENLIVE / ENSURE PLUS) liquid 237 mL (has no administration in time range)  acetaminophen  (TYLENOL ) tablet 1,000 mg (1,000 mg Oral Given 04/10/23 0719)  oxyCODONE  (Oxy IR/ROXICODONE ) immediate release tablet 5 mg (has no administration in time range)  morphine  (PF) 2 MG/ML injection 2 mg (2 mg Intravenous Given 04/10/23 0720)  methocarbamol  (ROBAXIN ) tablet 500 mg (has no administration in time range)    Or  methocarbamol  (ROBAXIN ) injection 500 mg (has no administration in time range)  ondansetron  (ZOFRAN -ODT)  disintegrating tablet 4 mg (has no administration in time range)    Or  ondansetron  (ZOFRAN ) injection 4 mg (has no administration in time range)  simethicone  (MYLICON) chewable tablet 40 mg (has no administration in time range)  pantoprazole  (PROTONIX ) injection 40 mg (has no administration in time range)  ALPRAZolam  (XANAX ) tablet 1 mg (has no administration in time range)  venlafaxine  (EFFEXOR ) tablet 75 mg (has no administration in time range)  zolpidem  (AMBIEN ) tablet 10 mg (has no administration in time range)  piperacillin -tazobactam (ZOSYN ) IVPB 3.375 g (has no administration in time range)  amphetamine -dextroamphetamine  (ADDERALL  XR) 24 hr capsule 50 mg (has no administration in time range)  HYDROcodone -acetaminophen  (NORCO/VICODIN) 5-325 MG per tablet 1 tablet (1 tablet Oral Given 04/10/23 0219)  ondansetron  (ZOFRAN -ODT) disintegrating tablet 8 mg (8 mg Oral Given 04/10/23 0219)  haloperidol  lactate (HALDOL ) injection 5 mg (5 mg Intramuscular Given 04/10/23 0218)  iohexol  (OMNIPAQUE ) 300 MG/ML solution 100 mL (100 mLs Intravenous Contrast Given 04/10/23 0341)  acetaminophen  (TYLENOL ) tablet 650 mg (650 mg Oral Given  04/10/23 0626)  piperacillin -tazobactam (ZOSYN ) IVPB 3.375 g (0 g Intravenous Stopped 04/10/23 9096)    Mobility walks     Focused Assessments Neuro Assessment Handoff:  Swallow screen pass?  N/A         Neuro Assessment:   Neuro Checks:      Has TPA been given? No If patient is a Neuro Trauma and patient is going to OR before floor call report to 4N Charge nurse: [phone] or [phone]   R Recommendations: See Admitting Provider Note  Report given to:   Additional Notes: Normally ambulates without assistance but needed W/C this last time to get to bathroom due to weakness

## 2023-04-10 NOTE — Progress Notes (Signed)
 Late Entry; Patient admitted to [facility]. Patient AOX4;  noted with anxiety and black tarry stool on admission. Bath given and bed alarm activated. Noted with JP drain and midline incision with packing and staples-wound care provided. Patient orient to unit.   04/10/23 1009  Vitals  Temp 98.3 F (36.8 C)  BP (!) 167/86  MAP (mmHg) 110  BP Location Left Arm  BP Method Automatic  Patient Position (if appropriate) Lying  Pulse Rate 77  Pulse Rate Source Monitor  Resp 18  MEWS COLOR  MEWS Score Color Green  Oxygen Therapy  SpO2 100 %  O2 Device Room Air  MEWS Score  MEWS Temp 0  MEWS Systolic 0  MEWS Pulse 0  MEWS RR 0  MEWS LOC 0  MEWS Score 0

## 2023-04-10 NOTE — Plan of Care (Signed)

## 2023-04-10 NOTE — ED Notes (Signed)
 Pt assisted to bathroom, Pt made aware of urine sample needed. She tried giving one but unsuccessful.

## 2023-04-10 NOTE — H&P (Signed)
 Kara [name] is an 68 y.o. female.   Chief Complaint: readmission HPI: 64 yof left ama last night because she had family emergency.  She has been difficult to care for and does not follow instructions. She is pod 3 from graham patch of perforated ulcer likely related to motrin usage.  Swallow yesterday was fine. Received one unit yesterday for anemia present preop.  For some reason she got a ct scan in er with this and that's fine.  Will readmit. Drain is serous.   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   Post-nasal drip    hx of   Pre-diabetes    patient states she is not Pre-Diabetic at appt. 12/16/2018   PTSD (post-traumatic stress disorder)    RA (rheumatoid arthritis) (HCC)    autoimmune arthritis   RA (rheumatoid arthritis) (HCC)    Spondylitis (HCC)    Suicidal intent     Past Surgical History:  Procedure Laterality Date   ANTERIOR CERVICAL DECOMP/DISCECTOMY FUSION N/A 03/15/2015   Procedure: Cervical five-six, Cerival six-seven, Anterior Cervical Discectomy and Fusion, Allograft  and Plate;  Surgeon: Oneil JAYSON Herald, MD;  Location: [facility];  Service: Orthopedics;  Laterality: N/A;   ANTERIOR LAT LUMBAR FUSION Left 06/11/2018   Procedure: Left Lumbar two-three Anterolateral decompression/fusion/lateral plate fixation;  Surgeon: Colon Shove, MD;  Location: [facility];  Service: Neurosurgery;  Laterality: Left;  Left Lumbar two-three Anterolateral decompression/fusion/lateral plate fixation   AUGMENTATION MAMMAPLASTY  2003   BACK SURGERY     BLADDER SUSPENSION  2009   BONE EXCISION Right 11/26/2018   Procedure: TRAPEZIUM EXCISION RIGHT THUMB;  Surgeon: Murrell Kuba, MD;  Location: [facility];  Service: Orthopedics;  Laterality: Right;  AXILLARY BLOCK   BREAST IMPLANT REMOVAL Bilateral 10/2013   CARPOMETACARPEL SUSPENSION PLASTY Right 11/26/2018   Procedure: BERNEDA SUSPENSION;  Surgeon: Murrell Kuba, MD;  Location: [facility];  Service: Orthopedics;  Laterality: Right;   CERVICAL WOUND DEBRIDEMENT N/A 07/07/2016   Procedure: IRRIGATION AND DEBRIDEMENT POSTERIOR NECK;  Surgeon: Oneil JAYSON Herald, MD;  Location: [facility];  Service: Orthopedics;  Laterality: N/A;   COLONOSCOPY     COMBINED ABDOMINOPLASTY AND LIPOSUCTION  2003   INCISION AND DRAINAGE ABSCESS Right 01/16/2014   Procedure: INCISION AND DRAINAGE AND OF RIGHT BREAST ABCESS;  Surgeon: Morene Olives, MD;  Location: [facility];  Service: General;  Laterality: Right;   JOINT REPLACEMENT     LAPAROTOMY N/A 04/07/2023   Procedure: EXPLORATORY LAPAROTOMY; ARLYSS LISLE;  Surgeon: Ebbie Cough, MD;  Location: [facility];  Service: General;  Laterality: N/A;   LUMBAR LAMINECTOMY/DECOMPRESSION MICRODISCECTOMY N/A 12/10/2015   Procedure: Right L3-4 Hemilaminectomy, Excision of herniated nucleus pulposus;  Surgeon: Oneil JAYSON Herald, MD;  Location: [facility];  Service: Orthopedics;  Laterality: N/A;   MASS EXCISION  11/03/2011   Procedure: MINOR EXCISION OF MASS;  Surgeon: Lamar LULLA Leonor Mickey., MD;  Location: [facility];  Service: Orthopedics;  Laterality: Left;  debride IP joint, cyst excision left index   MAXIMUM ACCESS (MAS) TRANSFORAMINAL LUMBAR INTERBODY FUSION (TLIF) 2 LEVEL Right 02/23/2017   POSTERIOR CERVICAL FUSION/FORAMINOTOMY N/A 06/21/2016   Procedure: POSTERIOR CERVICAL FUSION C5-C7 SPINOUS PROCESS WIRING;  Surgeon: Herald Oneil JAYSON, MD;  Location: [facility];  Service: Orthopedics;  Laterality: N/A;   POSTERIOR LUMBAR FUSION  07/2009; 07/03/2014   L4-5; L5-S1   SHOULDER ARTHROSCOPY Right 2012   TENDON TRANSFER Right 11/26/2018   Procedure: TENDON TRANSFER;  Surgeon: Murrell Kuba, MD;  Location: [facility];  Service: Orthopedics;  Laterality: Right;   TONSILLECTOMY  1987   TOTAL SHOULDER ARTHROPLASTY  08/01/2011   Procedure: TOTAL SHOULDER ARTHROPLASTY;  Surgeon: Fonda SHAUNNA Olmsted, MD;  Location: [facility];  Service: Orthopedics;  Laterality: Right;  Right total shoulder arthroplasty   TUBAL LIGATION  1988    Family History  Problem Relation Age of Onset   Arthritis Mother    Heart disease Mother        ?psvt   Breast cancer Mother 31       TAH/BSO   Cancer Mother    Mental illness Mother    COPD Father    Hypertension Father    Alcohol  abuse Father    Mental illness Father    Heart disease Father    Healthy Daughter    Breast cancer Maternal Aunt 48       deceased   Cancer Cousin 72       female; unknown primary   Colon cancer Paternal Aunt 88       deceased at 47   Stomach cancer Paternal Uncle 10       deceased at 62   Alcohol  abuse Brother    Cancer Brother    Hodgkin's lymphoma Brother    Mental illness Brother    HIV Brother    Healthy Brother    Healthy Son    Social History:  reports that she quit smoking about 17 years ago. Her smoking use included cigarettes. She started smoking about 27 years ago. She has a 10 pack-year smoking history. She has never used smokeless tobacco. She reports current alcohol  use. She reports that she does not currently use drugs  after having used the following drugs: Marijuana.  Allergies:  Allergies  Allergen Reactions   Aspirin Swelling    Throat swells   Bee Venom Anaphylaxis   Ibuprofen Swelling    Throat swells   Ativan  [Lorazepam ] Other (See Comments)    Agitation/confusion    Ketamine  Other (See Comments)    Hallucinations   Toradol  [Ketorolac  Tromethamine ] Itching, Swelling and Rash   Tramadol Hcl Itching, Swelling and Rash    Tolerates Dilaudid  06/2016.  TDD.   Current Facility-Administered Medications  Medication Dose Route Frequency Provider Last Rate Last Admin   acetaminophen  (TYLENOL ) tablet 1,000 mg  1,000 mg Oral Q6H  Ebbie Cough, MD       ALPRAZolam  (XANAX ) tablet 1 mg  1 mg Oral BID Ebbie Cough, MD       amphetamine -dextroamphetamine  (ADDERALL  XR) 24 hr capsule 1 capsule  25 mg Oral BID Ebbie Cough, MD       feeding supplement (ENSURE ENLIVE / ENSURE PLUS) liquid 237 mL  237 mL Oral BID BM Ebbie Cough, MD       heparin  injection 5,000 Units  5,000 Units Subcutaneous Q8H Ebbie Cough, MD       methocarbamol  (ROBAXIN ) tablet 500 mg  500 mg Oral Q8H PRN Ebbie Cough, MD       Or   methocarbamol  (ROBAXIN ) injection 500 mg  500 mg Intravenous Q8H PRN Ebbie Cough, MD       morphine  (PF) 2 MG/ML injection 2 mg  2 mg Intravenous Q4H PRN Ebbie Cough, MD       ondansetron  (ZOFRAN -ODT) disintegrating tablet 4 mg  4 mg Oral Q6H PRN Ebbie Cough, MD       Or   ondansetron  (ZOFRAN ) injection 4 mg  4 mg Intravenous Q6H PRN Ebbie Cough, MD       oxyCODONE  (Oxy IR/ROXICODONE ) immediate release tablet 5 mg  5 mg Oral Q4H PRN Ebbie Cough, MD       pantoprazole  (PROTONIX ) injection 40 mg  40 mg Intravenous Q12H Ebbie Cough, MD       simethicone  (MYLICON) chewable tablet 40 mg  40 mg Oral Q6H PRN Ebbie Cough, MD       venlafaxine  (EFFEXOR ) tablet 75 mg  75 mg Oral q morning Ebbie Cough, MD       zolpidem  (AMBIEN )  tablet 10 mg  10 mg Oral QHS PRN Ebbie Cough, MD       Current Outpatient Medications  Medication Sig Dispense Refill   ALPRAZolam  (XANAX ) 1 MG tablet Take 1 mg by mouth 2 (two) times daily.     amphetamine -dextroamphetamine  (ADDERALL  XR) 25 MG 24 hr capsule Take 25 mg by mouth 2 (two) times daily.     amphetamine -dextroamphetamine  (ADDERALL ) 20 MG tablet Take 10 mg by mouth every evening.     meloxicam  (MOBIC ) 15 MG tablet Take 15 mg by mouth 2 (two) times daily as needed for pain.     UNABLE TO FIND Med Name: fentanyl  patch     venlafaxine  (EFFEXOR ) 75 MG tablet Take 75 mg by mouth every morning.     zolpidem  (AMBIEN ) 10 MG tablet Take 10 mg by mouth at bedtime as needed for sleep.     acetaminophen  (TYLENOL ) 325 MG tablet Take 650 mg by mouth every 4 (four) hours as needed. (Patient not taking: Reported on 04/10/2023)     ARIPiprazole  (ABILIFY ) 2 MG tablet Take 2 mg by mouth at bedtime. (Patient not taking: Reported on 04/10/2023)     DULoxetine  (CYMBALTA ) 20 MG capsule Take 1 capsule (20 mg total) by mouth daily. (Patient not taking: Reported on 04/07/2023) 30 capsule 1   hydrOXYzine  (VISTARIL ) 25 MG capsule Take 25 mg by mouth 4 (four) times daily. (Patient not taking: Reported on 04/07/2023)     lisdexamfetamine (VYVANSE ) 10 MG capsule Take 10 mg by mouth daily. (Patient not taking: Reported on 04/10/2023)     lisinopril  (PRINIVIL ,ZESTRIL ) 10 MG tablet TAKE 1 AND 1/2 TABLETS DAILY BY MOUTH (Patient not taking: Reported on 04/07/2023) 45 tablet 1      Results for orders placed or performed during the hospital encounter of 04/10/23 (from the past  48 hours)  Lipase, blood     Status: None   Collection Time: 04/10/23  2:14 AM  Result Value Ref Range   Lipase 22 11 - 51 U/L    Comment: Performed at [facility], [address], [place], [place] [address]  Comprehensive metabolic panel     Status: Abnormal   Collection Time: 04/10/23  2:14 AM  Result Value Ref Range    Sodium 137 135 - 145 mmol/L   Potassium 3.4 (L) 3.5 - 5.1 mmol/L   Chloride 101 98 - 111 mmol/L   CO2 22 22 - 32 mmol/L   Glucose, Bld 99 70 - 99 mg/dL    Comment: Glucose reference range applies only to samples taken after fasting for at least 8 hours.   BUN 6 (L) 8 - 23 mg/dL   Creatinine, Ser 9.34 0.44 - 1.00 mg/dL   Calcium 9.2 8.9 - 89.6 mg/dL   Total Protein 7.1 6.5 - 8.1 g/dL   Albumin  3.4 (L) 3.5 - 5.0 g/dL   AST 27 15 - 41 U/L   ALT 13 0 - 44 U/L   Alkaline Phosphatase 83 38 - 126 U/L   Total Bilirubin 0.5 0.0 - 1.2 mg/dL   GFR, Estimated >39 >39 mL/min    Comment: (NOTE) Calculated using the CKD-EPI Creatinine Equation (2021)    Anion gap 14 5 - 15    Comment: Performed at [facility], [address] [place], [place] [address]  CBC     Status: Abnormal   Collection Time: 04/10/23  2:14 AM  Result Value Ref Range   WBC 15.6 (H) 4.0 - 10.5 K/uL   RBC 3.45 (L) 3.87 - 5.11 MIL/uL   Hemoglobin 9.9 (L) 12.0 - 15.0 g/dL    Comment: REPEATED TO VERIFY POST TRANSFUSION SPECIMEN    HCT 30.3 (L) 36.0 - 46.0 %   MCV 87.8 80.0 - 100.0 fL   MCH 28.7 26.0 - 34.0 pg   MCHC 32.7 30.0 - 36.0 g/dL   RDW 85.1 88.4 - 84.4 %   Platelets 566 (H) 150 - 400 K/uL   nRBC 0.0 0.0 - 0.2 %    Comment: Performed at [facility], [address], [place], [place] [address]   CT ABDOMEN PELVIS W CONTRAST Result Date: 04/10/2023 CLINICAL DATA:  Postop abdominal pain EXAM: CT ABDOMEN AND PELVIS WITH CONTRAST TECHNIQUE: Multidetector CT imaging of the abdomen and pelvis was performed using the standard protocol following bolus administration of intravenous contrast. RADIATION DOSE REDUCTION: This exam was performed according to the departmental dose-optimization program which includes automated exposure control, adjustment of the mA and/or kV according to patient size and/or use of iterative reconstruction technique. CONTRAST:  OMNIPAQUE  IOHEXOL  300 MG/ML   SOLN COMPARISON:  04/07/2023 FINDINGS: Lower chest:  Trace layering pleural fluid. Hepatobiliary: 7 mm cystic density in the left lobe liver. 18 mm cystic density in the right lobe liver. Full gallbladder. No biliary duct dilatation or calcified stone. Pancreas: Generalized atrophy. Spleen: Calcified nodule in the central spleen. Adrenals/Urinary Tract: Negative adrenals. No hydronephrosis or stone. Small volume gas in the urinary bladder which may be related to recent surgery and catheterization. No associated bladder wall thickening or visible debris. Stomach/Bowel: Interval repair of perforated peptic ulcer, reportedly with Arlyss patch 04/07/2023. Regressed thickening at of the distal stomach, no regional fluid collection or significant residual pneumoperitoneum. A drain traverses the operative region from right upper quadrant approach. No ileus  or obstruction. Vascular/Lymphatic: No acute vascular abnormality. No mass or adenopathy. Reproductive:No pathologic findings. Other: Treated pneumoperitoneum.  No ascites or fluid collection. 3 distinct hernias in the left lateral flank, the most superior containing proximal descending colonic wall since prior, but no colonic wall thickening. There is a fatty hernia in the right groin as well. Expected findings of recent laparotomy without abdominal wall fluid collection. Musculoskeletal: Transitional lumbosacral vertebra numbered S1. L2-S1 fusion with no bridging bone seen at L2-3 and L3-4. The open levels show prominent disc and facet degeneration narrowing the spinal canal and foramina. IMPRESSION: 1. No unexpected finding after gastric ulcer repair. No ileus or collection. 2. Three hernias along the left lateral flank, the most superior containing descending colon. There is also a fatty right groin hernia. Electronically Signed   By: Dorn Roulette M.D.   On: 04/10/2023 04:29   DG UGI W SINGLE CM (SOL OR THIN BA) Result Date: 04/09/2023 CLINICAL DATA:  Provided  history: Perforated peptic ulcer. Additional history obtained from electronic MEDICAL RECORD NUMBERExploratory laparotomy with Arlyss patch repair of a perforated pre-pyloric ulcer on 04/07/2023. EXAM: WATER  SOLUBLE UPPER GI SERIES TECHNIQUE: After a scout radiograph of the abdomen was acquired, a problem-oriented upper GI series was performed to assess for post-operative leak or obstruction. Water -soluble contrast was used (Omnipaque  300). CONTRAST:  75 mL Omnipaque  300 contrast. COMPARISON:  CT abdomen/pelvis 04/07/2023. FLUOROSCOPY: Radiation Exposure Index (as provided by the fluoroscopic device): 46.60 mGy Kerma FINDINGS: A scout radiograph of the abdomen was acquired. Surgical drain present with tip projecting at the level of the left upper quadrant. Nonspecific gaseous distension of the cecum/ascending colon and of scattered loops of small bowel. Postoperative changes from prior lumbar spinal fusion. Midline skin staples. Subsequently, a problem-oriented upper GI series was performed to assess for leak or obstruction status post Arlyss patch repair of a perforated pre-pyloric ulcer (using water -soluble contrast). Prompt passage of contrast from the esophagus into the stomach and into the proximal small bowel. No extraluminal contrast demonstrated to suggest a contrast leak. Moderate esophageal dysmotility with tertiary contractions. IMPRESSION: 1. Problem-oriented examination demonstrating no evidence of leak or obstruction status post Arlyss patch repair of a perforated pre-pyloric ulcer. 2. Moderate esophageal dysmotility with tertiary contractions. Electronically Signed   By: Rockey Childs D.O.   On: 04/09/2023 17:40    Review of Systems  Unable to perform ROS: Other    Blood pressure 133/69, pulse 82, temperature 98.1 F (36.7 C), temperature source Oral, resp. rate 18, weight 59 kg, SpO2 94%. Physical Exam Vitals reviewed.  Constitutional:      Appearance: She is well-developed.  Eyes:      General: No scleral icterus. Cardiovascular:     Rate and Rhythm: Normal rate and regular rhythm.  Pulmonary:     Effort: Pulmonary effort is normal.     Breath sounds: No wheezing.  Abdominal:     Palpations: Abdomen is soft.     Tenderness: There is no abdominal tenderness.     Hernia: No hernia is present.     Comments: Dressing with some drainage, jp serous  Skin:    General: Skin is warm and dry.     Capillary Refill: Capillary refill takes less than 2 seconds.  Neurological:     General: No focal deficit present.     Mental Status: She is alert.  Psychiatric:        Mood and Affect: Mood is anxious.        Behavior:  Behavior is agitated.      Assessment/Plan POD 3 graham patch ulcer -readmit, fulls, pain control -pulm toilet -await path from gastric biopsy -will need egd later with gi and csc as well due to anemia -all home meds -sq heparin  for prophylaxis -hopefully she can be discharged soon  Donnice Bury, MD 04/10/2023, 7:08 AM

## 2023-04-10 NOTE — ED Provider Notes (Signed)
 [place] [facility]ERGENCY DEPARTMENT AT [facility] Provider Note   CSN: [id number] Arrival date & time: 04/10/23  0105     History  Chief Complaint  Patient presents with   Abdominal Pain   Post-op Problem   HPI Kara [name] is a 68 y.o. female with MDD, fibromyalgia, history of kidney stones senting for abdominal pain.  States the pain is primarily in the upper abdomen and has been present since her surgery on the 11th but much worse today.  Had exploratory laparotomy surgery on 1/11 for perforated gastric ulcer believed to be precipitated by NSAID use.  States her pain is 10 out of 10.  She does have a JP drain in place.  States she left AMA during surgical admission due to a family emergency.  Endorses some nausea and vomiting but no diarrhea.  Denies fever and chills.   Abdominal Pain      Home Medications Prior to Admission medications   Medication Sig Start Date End Date Taking? Authorizing Provider  ALPRAZolam  (XANAX ) 1 MG tablet Take 1 mg by mouth 2 (two) times daily. 10/07/19  Yes [provider]  amphetamine -dextroamphetamine  (ADDERALL  XR) 25 MG 24 hr capsule Take 25 mg by mouth 2 (two) times daily. 09/30/19  Yes [provider]  amphetamine -dextroamphetamine  (ADDERALL ) 20 MG tablet Take 10 mg by mouth every evening. 09/30/19  Yes [provider]  meloxicam  (MOBIC ) 15 MG tablet Take 15 mg by mouth 2 (two) times daily as needed for pain.   Yes [provider]  UNABLE TO FIND Med Name: fentanyl  patch   Yes [provider]  venlafaxine  (EFFEXOR ) 75 MG tablet Take 75 mg by mouth every morning.   Yes [provider]  zolpidem  (AMBIEN ) 10 MG tablet Take 10 mg by mouth at bedtime as needed for sleep.   Yes [provider]  acetaminophen  (TYLENOL ) 325 MG tablet Take 650 mg by mouth every 4 (four) hours as needed. Patient not taking: Reported on 04/10/2023 01/30/23   [provider]  ARIPiprazole  (ABILIFY ) 2  MG tablet Take 2 mg by mouth at bedtime. Patient not taking: Reported on 04/10/2023 12/22/22   [provider]  DULoxetine  (CYMBALTA ) 20 MG capsule Take 1 capsule (20 mg total) by mouth daily. Patient not taking: Reported on 04/07/2023 03/16/23   Carilyn Prentice BRAVO, MD  hydrOXYzine  (VISTARIL ) 25 MG capsule Take 25 mg by mouth 4 (four) times daily. Patient not taking: Reported on 04/07/2023 01/18/23   [provider]  lisdexamfetamine (VYVANSE ) 10 MG capsule Take 10 mg by mouth daily. Patient not taking: Reported on 04/10/2023    [provider]  lisinopril  (PRINIVIL ,ZESTRIL ) 10 MG tablet TAKE 1 AND 1/2 TABLETS DAILY BY MOUTH Patient not taking: Reported on 04/07/2023 03/07/18   Catherine Fuller A, DO      Allergies    Aspirin, Bee venom, Ibuprofen, Ativan  [lorazepam ], Ketamine , Toradol  [ketorolac  tromethamine ], and Tramadol hcl    Review of Systems   Review of Systems  Gastrointestinal:  Positive for abdominal pain.    Physical Exam   Vitals:   04/10/23 0125 04/10/23 0545  BP: (!) 147/112 133/69  Pulse: 87 82  Resp: 20 18  Temp: 98.4 F (36.9 C) 98.1 F (36.7 C)  SpO2: 100% 94%    CONSTITUTIONAL:  well-appearing, NAD NEURO:  Alert and oriented x 3, CN 3-12 grossly intact EYES:  eyes equal and reactive ENT/NECK:  Supple, no stridor  CARDIO:  regular rate and rhythm, appears well-perfused  PULM:  No respiratory distress, CTAB GI/GU:  non-distended, soft, generalized tenderness, JP drain noted.  No notable erythema or edema around the JP drain insertion site MSK/SPINE:  No gross deformities, no edema, moves all extremities  SKIN:  no rash, atraumatic   *Additional and/or pertinent findings included in MDM below   ED Results / Procedures / Treatments   Labs (all labs ordered are listed, but only abnormal results are displayed) Labs Reviewed  COMPREHENSIVE METABOLIC PANEL - Abnormal; Notable for the following components:      Result Value   Potassium  3.4 (*)    BUN 6 (*)    Albumin  3.4 (*)    All other components within normal limits  CBC - Abnormal; Notable for the following components:   WBC 15.6 (*)    RBC 3.45 (*)    Hemoglobin 9.9 (*)    HCT 30.3 (*)    Platelets 566 (*)    All other components within normal limits  LIPASE, BLOOD  URINALYSIS, ROUTINE W REFLEX MICROSCOPIC  RAPID URINE DRUG SCREEN, HOSP PERFORMED    EKG None  Radiology CT ABDOMEN PELVIS W CONTRAST Result Date: 04/10/2023 CLINICAL DATA:  Postop abdominal pain EXAM: CT ABDOMEN AND PELVIS WITH CONTRAST TECHNIQUE: Multidetector CT imaging of the abdomen and pelvis was performed using the standard protocol following bolus administration of intravenous contrast. RADIATION DOSE REDUCTION: This exam was performed according to the departmental dose-optimization program which includes automated exposure control, adjustment of the mA and/or kV according to patient size and/or use of iterative reconstruction technique. CONTRAST:  OMNIPAQUE  IOHEXOL  300 MG/ML  SOLN COMPARISON:  04/07/2023 FINDINGS: Lower chest:  Trace layering pleural fluid. Hepatobiliary: 7 mm cystic density in the left lobe liver. 18 mm cystic density in the right lobe liver. Full gallbladder. No biliary duct dilatation or calcified stone. Pancreas: Generalized atrophy. Spleen: Calcified nodule in the central spleen. Adrenals/Urinary Tract: Negative adrenals. No hydronephrosis or stone. Small volume gas in the urinary bladder which may be related to recent surgery and catheterization. No associated bladder wall thickening or visible debris. Stomach/Bowel: Interval repair of perforated peptic ulcer, reportedly with Arlyss patch 04/07/2023. Regressed thickening at of the distal stomach, no regional fluid collection or significant residual pneumoperitoneum. A drain traverses the operative region from right upper quadrant approach. No ileus or obstruction. Vascular/Lymphatic: No acute vascular abnormality. No mass  or adenopathy. Reproductive:No pathologic findings. Other: Treated pneumoperitoneum.  No ascites or fluid collection. 3 distinct hernias in the left lateral flank, the most superior containing proximal descending colonic wall since prior, but no colonic wall thickening. There is a fatty hernia in the right groin as well. Expected findings of recent laparotomy without abdominal wall fluid collection. Musculoskeletal: Transitional lumbosacral vertebra numbered S1. L2-S1 fusion with no bridging bone seen at L2-3 and L3-4. The open levels show prominent disc and facet degeneration narrowing the spinal canal and foramina. IMPRESSION: 1. No unexpected finding after gastric ulcer repair. No ileus or collection. 2. Three hernias along the left lateral flank, the most superior containing descending colon. There is also a fatty right groin hernia. Electronically Signed   By: Dorn Roulette M.D.   On: 04/10/2023 04:29   DG UGI W SINGLE CM (SOL OR THIN BA) Result Date: 04/09/2023 CLINICAL DATA:  Provided history: Perforated peptic ulcer. Additional history obtained from electronic MEDICAL RECORD NUMBERExploratory laparotomy with Arlyss patch repair of a perforated pre-pyloric ulcer on 04/07/2023. EXAM: WATER  SOLUBLE UPPER GI SERIES TECHNIQUE: After a scout radiograph  of the abdomen was acquired, a problem-oriented upper GI series was performed to assess for post-operative leak or obstruction. Water -soluble contrast was used (Omnipaque  300). CONTRAST:  75 mL Omnipaque  300 contrast. COMPARISON:  CT abdomen/pelvis 04/07/2023. FLUOROSCOPY: Radiation Exposure Index (as provided by the fluoroscopic device): 46.60 mGy Kerma FINDINGS: A scout radiograph of the abdomen was acquired. Surgical drain present with tip projecting at the level of the left upper quadrant. Nonspecific gaseous distension of the cecum/ascending colon and of scattered loops of small bowel. Postoperative changes from prior lumbar spinal fusion. Midline skin staples.  Subsequently, a problem-oriented upper GI series was performed to assess for leak or obstruction status post Arlyss patch repair of a perforated pre-pyloric ulcer (using water -soluble contrast). Prompt passage of contrast from the esophagus into the stomach and into the proximal small bowel. No extraluminal contrast demonstrated to suggest a contrast leak. Moderate esophageal dysmotility with tertiary contractions. IMPRESSION: 1. Problem-oriented examination demonstrating no evidence of leak or obstruction status post Arlyss patch repair of a perforated pre-pyloric ulcer. 2. Moderate esophageal dysmotility with tertiary contractions. Electronically Signed   By: Rockey Childs D.O.   On: 04/09/2023 17:40    Procedures Procedures    Medications Ordered in ED Medications  HYDROmorphone  (DILAUDID ) injection 1 mg (1 mg Intravenous Given 04/10/23 0307)  acetaminophen  (TYLENOL ) tablet 650 mg (has no administration in time range)  HYDROcodone -acetaminophen  (NORCO/VICODIN) 5-325 MG per tablet 1 tablet (1 tablet Oral Given 04/10/23 0219)  ondansetron  (ZOFRAN -ODT) disintegrating tablet 8 mg (8 mg Oral Given 04/10/23 0219)  haloperidol  lactate (HALDOL ) injection 5 mg (5 mg Intramuscular Given 04/10/23 0218)  iohexol  (OMNIPAQUE ) 300 MG/ML solution 100 mL (100 mLs Intravenous Contrast Given 04/10/23 0341)    ED Course/ Medical Decision Making/ A&P                                 Medical Decision Making Amount and/or Complexity of Data Reviewed Labs: ordered. Radiology: ordered.  Risk Prescription drug management. Decision regarding hospitalization.   Initial Impression and Ddx 68 year old well-appearing female presenting for abdominal pain status post acute gastric ulcer perforation s/p repair on 1/11.  Exam notable for generalized tenderness and indwelling JP drain.  DDx includes postop bleeding or other complication, intra-abdominal infection, other. Patient PMH that increases complexity of ED  encounter:  MDD, fibromyalgia, history of kidney stones, and acute gastric ulcer perforation s/p repair on 1/11  Interpretation of Diagnostics - I independent reviewed and interpreted the labs as followed: Leukocytosis, anemia (improved from last check)  - I independently visualized the following imaging with scope of interpretation limited to determining acute life threatening conditions related to emergency care: CT ab/pelvis, which revealed no acute findings  Patient Reassessment and Ultimate Disposition/Management On reassessment, pain was much improved per patient. She was resting comfortably in her bed. CT scan did not reveal any acute findings. Discussed patient with Dr. Rubin of surgery who advised to admit to surgery service for further evaluation and management.  Patient remains well-appearing, in no acute distress and hemodynamically stable.  Patient management required discussion with the following services or consulting groups:  General/Trauma Surgery  Complexity of Problems Addressed Acute complicated illness or Injury  Additional Data Reviewed and Analyzed Further history obtained from: Prior ED visit notes and Recent discharge summary  Patient Encounter Risk Assessment Consideration of hospitalization         Final Clinical Impression(s) / ED Diagnoses Final diagnoses:  Post-op  pain    Rx / DC Orders ED Discharge Orders     None         Cordero Surette K, PA-C 04/10/23 [id number]    Griselda Norris, MD 04/10/23 [phone]

## 2023-04-10 NOTE — Discharge Instructions (Signed)

## 2023-04-10 NOTE — ED Notes (Signed)
 Patient keeps yelling out. RN redirected patient and explained she will not go to new room until bed assignment has been made, Patient refusing to keep BP cuff and O2 on, care ongoing.

## 2023-04-10 NOTE — ED Triage Notes (Signed)
 Pt in with abdominal pain, had exploratory lap surgery 1/11 for perforated gastric ulcer. Arrives in 10/10 pain and grasping abdomen, has JP drain that she states is putting out minimal output.

## 2023-04-11 LAB — BASIC METABOLIC PANEL
Anion gap: 9 (ref 5–15)
BUN: <5 mg/dL — ABNORMAL LOW (ref 8–23)
CO2: 27 mmol/L (ref 22–32)
Calcium: 8.8 mg/dL — ABNORMAL LOW (ref 8.9–10.3)
Chloride: 103 mmol/L (ref 98–111)
Creatinine, Ser: 0.58 mg/dL (ref 0.44–1.00)
GFR, Estimated: >60 mL/min (ref >60)
Glucose, Bld: 91 mg/dL (ref 70–99)
Potassium: 3.4 mmol/L — ABNORMAL LOW (ref 3.5–5.1)
Sodium: 139 mmol/L (ref 135–145)

## 2023-04-11 LAB — CBC
HCT: 32 % — ABNORMAL LOW (ref 36.0–46.0)
Hemoglobin: 10.5 g/dL — ABNORMAL LOW (ref 12.0–15.0)
MCH: 28.7 pg (ref 26.0–34.0)
MCHC: 32.8 g/dL (ref 30.0–36.0)
MCV: 87.4 fL (ref 80.0–100.0)
Platelets: 589 10*3/uL — ABNORMAL HIGH (ref 150–400)
RBC: 3.66 MIL/uL — ABNORMAL LOW (ref 3.87–5.11)
RDW: 15 % (ref 11.5–15.5)
WBC: 9.9 10*3/uL (ref 4.0–10.5)
nRBC: 0 % (ref 0.0–0.2)

## 2023-04-11 LAB — SURGICAL PATHOLOGY

## 2023-04-11 MED ORDER — PANTOPRAZOLE SODIUM 40 MG PO TBEC: 40.0000 mg | DELAYED_RELEASE_TABLET | Freq: Two times a day (BID) | ORAL | 3 refills | Status: AC

## 2023-04-11 MED ORDER — OXYCODONE HCL 5 MG PO TABS: 5.0000 mg | ORAL_TABLET | ORAL | 0 refills | Status: DC | PRN

## 2023-04-11 MED ORDER — METHOCARBAMOL 500 MG PO TABS: 500.0000 mg | ORAL_TABLET | Freq: Three times a day (TID) | ORAL | 0 refills | Status: AC | PRN

## 2023-04-11 NOTE — Consult Note (Signed)
 [facility] Liaison Consult Note   04/11/2023  Kara [name] [birth year] [id number]  Primary Care Provider:  Vevelyn Gowers, NP Is with [facility] this provider  is currently not in an affiliated provider with [facility] [VBCI]   Insurance: [place]   The patient is not on the current member enrollment rosters for any of the [employer] risk contracted plans with an affiliated Nature conservation officer.     Reason:  Not a beneficiary currently attributed to one of the VBCI populations.   *Patient's primary care provider is not an in-network provider with [facility] at this time. *Membership roster was used to verify patient status.  Will sign off.     For additional questions or referrals please contact:    For questions, please call:  [place], RN, BSN, CCM [place]  [facility] Liaison Direct Dial: [phone] or secure chat Email: [email or web address]

## 2023-04-11 NOTE — Progress Notes (Signed)
 Subjective/Chief Complaint: Having bowel function, tol diet, no issues   Objective: Vital signs in last 24 hours: Temp:  [97.8 F (36.6 C)-98.3 F (36.8 C)] 98.3 F (36.8 C) (01/14 2159) Pulse Rate:  [77-81] 78 (01/14 2159) Resp:  [12-18] 12 (01/14 2159) BP: (123-167)/(77-98) 123/77 (01/14 2159) SpO2:  [95 %-100 %] 100 % (01/14 2159) Last BM Date : 04/10/23  Intake/Output from previous day: 01/14 0701 - 01/15 0700 In: 913.4 [P.O.:780; IV Piggyback:133.4] Out: 45 [Drains:45] Intake/Output this shift: No intake/output data recorded.  General nad Cv regular Pulm effort normal Ab soft approp tender drain serous incision clean (wicks out)   Lab Results:  Recent Labs    04/10/23 0214 04/10/23 0718  WBC 15.6* 12.5*  HGB 9.9* 8.6*  HCT 30.3* 26.7*  PLT 566* 455*   BMET Recent Labs    04/09/23 0437 04/10/23 0214 04/10/23 0718  NA 140 137  --   K 3.4* 3.4*  --   CL 104 101  --   CO2 27 22  --   GLUCOSE 92 99  --   BUN 11 6*  --   CREATININE 0.76 0.65 0.49  CALCIUM 8.7* 9.2  --    PT/INR No results for input(s): "LABPROT", "INR" in the last 72 hours. ABG No results for input(s): "PHART", "HCO3" in the last 72 hours.  Invalid input(s): "PCO2", "PO2"  Studies/Results: CT ABDOMEN PELVIS W CONTRAST Result Date: 04/10/2023 CLINICAL DATA:  Postop abdominal pain EXAM: CT ABDOMEN AND PELVIS WITH CONTRAST TECHNIQUE: Multidetector CT imaging of the abdomen and pelvis was performed using the standard protocol following bolus administration of intravenous contrast. RADIATION DOSE REDUCTION: This exam was performed according to the departmental dose-optimization program which includes automated exposure control, adjustment of the mA and/or kV according to patient size and/or use of iterative reconstruction technique. CONTRAST:  OMNIPAQUE  IOHEXOL  300 MG/ML  SOLN COMPARISON:  04/07/2023 FINDINGS: Lower chest:  Trace layering pleural fluid. Hepatobiliary: 7 mm cystic  density in the left lobe liver. 18 mm cystic density in the right lobe liver. Full gallbladder. No biliary duct dilatation or calcified stone. Pancreas: Generalized atrophy. Spleen: Calcified nodule in the central spleen. Adrenals/Urinary Tract: Negative adrenals. No hydronephrosis or stone. Small volume gas in the urinary bladder which may be related to recent surgery and catheterization. No associated bladder wall thickening or visible debris. Stomach/Bowel: Interval repair of perforated peptic ulcer, reportedly with Tyrone Gallop patch 04/07/2023. Regressed thickening at of the distal stomach, no regional fluid collection or significant residual pneumoperitoneum. A drain traverses the operative region from right upper quadrant approach. No ileus or obstruction. Vascular/Lymphatic: No acute vascular abnormality. No mass or adenopathy. Reproductive:No pathologic findings. Other: Treated pneumoperitoneum.  No ascites or fluid collection. 3 distinct hernias in the left lateral flank, the most superior containing proximal descending colonic wall since prior, but no colonic wall thickening. There is a fatty hernia in the right groin as well. Expected findings of recent laparotomy without abdominal wall fluid collection. Musculoskeletal: Transitional lumbosacral vertebra numbered S1. L2-S1 fusion with no bridging bone seen at L2-3 and L3-4. The open levels show prominent disc and facet degeneration narrowing the spinal canal and foramina. IMPRESSION: 1. No unexpected finding after gastric ulcer repair. No ileus or collection. 2. Three hernias along the left lateral flank, the most superior containing descending colon. There is also a fatty right groin hernia. Electronically Signed   By: Ronnette Coke M.D.   On: 04/10/2023 04:29   DG UGI W  SINGLE CM (SOL OR THIN BA) Result Date: 04/09/2023 CLINICAL DATA:  Provided history: Perforated peptic ulcer. Additional history obtained from electronic MEDICAL RECORD NUMBERExploratory  laparotomy with Tyrone Gallop patch repair of a perforated pre-pyloric ulcer on 04/07/2023. EXAM: WATER  SOLUBLE UPPER GI SERIES TECHNIQUE: After a scout radiograph of the abdomen was acquired, a problem-oriented upper GI series was performed to assess for post-operative leak or obstruction. Water -soluble contrast was used (Omnipaque  300). CONTRAST:  75 mL Omnipaque  300 contrast. COMPARISON:  CT abdomen/pelvis 04/07/2023. FLUOROSCOPY: Radiation Exposure Index (as provided by the fluoroscopic device): 46.60 mGy Kerma FINDINGS: A scout radiograph of the abdomen was acquired. Surgical drain present with tip projecting at the level of the left upper quadrant. Nonspecific gaseous distension of the cecum/ascending colon and of scattered loops of small bowel. Postoperative changes from prior lumbar spinal fusion. Midline skin staples. Subsequently, a problem-oriented upper GI series was performed to assess for leak or obstruction status post Tyrone Gallop patch repair of a perforated pre-pyloric ulcer (using water -soluble contrast). Prompt passage of contrast from the esophagus into the stomach and into the proximal small bowel. No extraluminal contrast demonstrated to suggest a contrast leak. Moderate esophageal dysmotility with tertiary contractions. IMPRESSION: 1. Problem-oriented examination demonstrating no evidence of leak or obstruction status post Tyrone Gallop patch repair of a perforated pre-pyloric ulcer. 2. Moderate esophageal dysmotility with tertiary contractions. Electronically Signed   By: Bascom Lily D.O.   On: 04/09/2023 17:40    Anti-infectives: Anti-infectives (From admission, onward)    Start     Dose/Rate Route Frequency Ordered Stop   04/10/23 1600  piperacillin -tazobactam (ZOSYN ) IVPB 3.375 g        3.375 g 12.5 mL/hr over 240 Minutes Intravenous Every 8 hours 04/10/23 0724     04/10/23 0730  piperacillin -tazobactam (ZOSYN ) IVPB 3.375 g        3.375 g 100 mL/hr over 30 Minutes Intravenous STAT 04/10/23 0724  04/10/23 0903       Assessment/Plan: POD 4 graham patch ulcer -soft diet, if tolerates will dc home after lunch today -pulm toilet -gastric biopsy path negative, h pylori pending -will need egd later with gi and csc as well due to anemia, will setup as outpatient -all home meds -sq heparin  for prophylaxis -do not think she is bleeding, her hct bumped too high after one unit and where she is now is appropriate -dc home after lunch if tolerates Enid Harry 04/11/2023

## 2023-04-11 NOTE — Progress Notes (Signed)
 Per order JP drain removed.Pt tolerated well. No complains of pain or discomfort.Aseptic technique followed.

## 2023-04-11 NOTE — TOC Progression Note (Signed)
 Transition of Care [facility]) - Inpatient Brief Assessment  Patient Details  Name: Kara [name] MRN: [id number] Date of Birth: Aug 13, 1955  Transition of Care [facility]) CM/SW Contact:    Zenon Hilda, LCSW Phone Number: 04/11/2023, 1:34 PM  Clinical Narrative: Screening completed. No TOC needs identified at this time.  Transition of Care Asessment: Insurance and Status: Insurance coverage has been reviewed Patient has primary care physician: Yes Home environment has been reviewed: Resides in an apartment with family Prior level of function:: Independent with ADLs at baseline Prior/Current Home Services: No current home services Social Drivers of Health Review: SDOH reviewed no interventions necessary Readmission risk has been reviewed: Yes Transition of care needs: no transition of care needs at this time  Expected Discharge Plan and Services Expected Discharge Date: 04/11/23                Social Determinants of Health (SDOH) Interventions SDOH Screenings   Food Insecurity: No Food Insecurity (04/10/2023)  Housing: Low Risk  (04/10/2023)  Transportation Needs: No Transportation Needs (04/10/2023)  Utilities: Not At Risk (04/10/2023)  Alcohol  Screen: Low Risk  (12/17/2018)  Depression (PHQ2-9): High Risk (03/16/2023)  Financial Resource Strain: Patient Declined (10/02/2022)   Received from [facility]  Physical Activity: Unknown (10/02/2022)   Received from [facility]  Social Connections: Unknown (04/10/2023)  Stress: Stress Concern Present (11/30/2022)  Tobacco Use: Medium Risk (04/10/2023)   Readmission Risk Interventions    04/09/2023   11:47 AM  Readmission Risk Prevention Plan  Transportation Screening Complete  Medication Review (RN Care Manager) Complete  PCP or Specialist appointment within 3-5 days of discharge Complete  HRI or Home Care Consult Complete  SW Recovery Care/Counseling Consult Complete  Palliative Care Screening Not Applicable  Skilled Nursing Facility Not  Applicable

## 2023-04-12 NOTE — Discharge Summary (Signed)
[facility] Discharge Summary   Patient ID: Kara [name] MRN: [id number] DOB/AGE: [birth year] 68 y.o.  Admit date: 04/10/2023 Discharge date: 04/12/2023  Admitting Diagnosis: Perforated gastric ulcer   Discharge Diagnosis S/P graham patch repair of perforated ulcer  Consultants None   Imaging: No results found.  Procedures Dr. Emelia Loron (04/07/23) - Exploratory laparotomy, Cheree Ditto patch repair of gastric ulcer  Hospital Course:  Patient is a 68 year old female who presented to the ED with abdominal pain.  Workup showed perforated gastric ulcer.  Patient was admitted and underwent procedure listed above.  Tolerated procedure well and was transferred to the floor. UGI negative for leak on POD2.  Diet was advanced as tolerated.  Patient left the hospital AMA to deal with a family emergency on POD2 but returned to the ED for readmission on POD3. On POD4, the patient was voiding well, tolerating diet, ambulating well, pain well controlled, vital signs stable, incisions c/d/i and felt stable for discharge home.  Patient will follow up in our office as outlined below. Drain removed prior to discharge.    I or a member of my team have reviewed this patient in the Controlled Substance Database.   Allergies as of 04/11/2023       Reactions   Aspirin Swelling   Throat swells   Bee Venom Anaphylaxis   Ibuprofen Swelling   Throat swells   Ativan [lorazepam] Other (See Comments)   Agitation/confusion   Ketamine Other (See Comments)   Hallucinations   Toradol [ketorolac Tromethamine] Itching, Swelling, Rash   Tramadol Hcl Itching, Swelling, Rash   Tolerates Dilaudid 06/2016.  TDD.        Medication List     STOP taking these medications    meloxicam 15 MG tablet Commonly known as: MOBIC       TAKE these medications    acetaminophen 325 MG tablet Commonly known as: TYLENOL Take 650 mg by mouth every 4 (four) hours as needed.   ALPRAZolam 1 MG  tablet Commonly known as: XANAX Take 1 mg by mouth 2 (two) times daily.   amphetamine-dextroamphetamine 25 MG 24 hr capsule Commonly known as: ADDERALL XR Take 25 mg by mouth 2 (two) times daily.   amphetamine-dextroamphetamine 20 MG tablet Commonly known as: ADDERALL Take 10 mg by mouth every evening.   ARIPiprazole 2 MG tablet Commonly known as: ABILIFY Take 2 mg by mouth at bedtime.   DULoxetine 20 MG capsule Commonly known as: CYMBALTA Take 1 capsule (20 mg total) by mouth daily.   hydrOXYzine 25 MG capsule Commonly known as: VISTARIL Take 25 mg by mouth 4 (four) times daily.   lisinopril 10 MG tablet Commonly known as: ZESTRIL TAKE 1 AND 1/2 TABLETS DAILY BY MOUTH   methocarbamol 500 MG tablet Commonly known as: ROBAXIN Take 1 tablet (500 mg total) by mouth every 8 (eight) hours as needed for muscle spasms.   oxyCODONE 5 MG immediate release tablet Commonly known as: Oxy IR/ROXICODONE Take 1 tablet (5 mg total) by mouth every 4 (four) hours as needed for moderate pain (pain score 4-6).   pantoprazole 40 MG tablet Commonly known as: Protonix Take 1 tablet (40 mg total) by mouth 2 (two) times daily.   UNABLE TO FIND Med Name: fentanyl patch   venlafaxine 75 MG tablet Commonly known as: EFFEXOR Take 75 mg by mouth every morning.   Vyvanse 10 MG capsule Generic drug: lisdexamfetamine Take 10 mg by mouth daily.   zolpidem 10 MG tablet Commonly  known as: AMBIEN Take 10 mg by mouth at bedtime as needed for sleep.          Follow-up Information     [facility]. Go on 04/20/2023.   Specialty: General Surgery Why: 9:30 AM, please arrive 30 min prior to appointment time to check in. RN visit for staple removal. Contact information: [address] [place] [address] [phone]         Emelia Loron, MD. Go on 04/27/2023.   Specialty: General Surgery Why: 8:45 AM, please arrive 15 min prior to appointment time to check  in. Contact information: [address] [place] [address] [phone]                 Signed: Juliet Rude , [facility] 04/12/2023, 10:10 AM Please see Amion for pager number during day hours 7:00am-4:30pm

## 2023-04-13 ENCOUNTER — Ambulatory Visit: Payer: Medicare Other | Admitting: Physical Medicine & Rehabilitation

## 2023-04-16 ENCOUNTER — Emergency Department (HOSPITAL_COMMUNITY)
Admission: EM | Admit: 2023-04-16 | Discharge: 2023-04-16 | Disposition: A | Discharge status: Home / Self Care | Payer: Medicare Other | Attending: Emergency Medicine | Admitting: Emergency Medicine

## 2023-04-16 ENCOUNTER — Other Ambulatory Visit: Payer: Self-pay

## 2023-04-16 DIAGNOSIS — G8918 Other acute postprocedural pain: Secondary | ICD-10-CM | POA: Diagnosis present

## 2023-04-16 DIAGNOSIS — Z48815 Encounter for surgical aftercare following surgery on the digestive system: Secondary | ICD-10-CM | POA: Insufficient documentation

## 2023-04-16 DIAGNOSIS — R109 Unspecified abdominal pain: Secondary | ICD-10-CM | POA: Insufficient documentation

## 2023-04-16 LAB — COMPREHENSIVE METABOLIC PANEL
ALT: 8 U/L (ref 0–44)
AST: 11 U/L — ABNORMAL LOW (ref 15–41)
Albumin: 3.4 g/dL — ABNORMAL LOW (ref 3.5–5.0)
Alkaline Phosphatase: 77 U/L (ref 38–126)
Anion gap: 9 (ref 5–15)
BUN: 17 mg/dL (ref 8–23)
CO2: 27 mmol/L (ref 22–32)
Calcium: 9 mg/dL (ref 8.9–10.3)
Chloride: 102 mmol/L (ref 98–111)
Creatinine, Ser: 0.31 mg/dL — ABNORMAL LOW (ref 0.44–1.00)
GFR, Estimated: >60 mL/min (ref >60)
Glucose, Bld: 104 mg/dL — ABNORMAL HIGH (ref 70–99)
Potassium: 3.6 mmol/L (ref 3.5–5.1)
Sodium: 138 mmol/L (ref 135–145)
Total Bilirubin: 0.6 mg/dL (ref 0.0–1.2)
Total Protein: 7.1 g/dL (ref 6.5–8.1)

## 2023-04-16 LAB — CBC
HCT: 29.2 % — ABNORMAL LOW (ref 36.0–46.0)
Hemoglobin: 10.4 g/dL — ABNORMAL LOW (ref 12.0–15.0)
MCH: 28 pg (ref 26.0–34.0)
MCHC: 35.6 g/dL (ref 30.0–36.0)
MCV: 78.5 fL — ABNORMAL LOW (ref 80.0–100.0)
Platelets: 711 10*3/uL — ABNORMAL HIGH (ref 150–400)
RBC: 3.72 MIL/uL — ABNORMAL LOW (ref 3.87–5.11)
RDW: 14.6 % (ref 11.5–15.5)
WBC: 10.9 10*3/uL — ABNORMAL HIGH (ref 4.0–10.5)
nRBC: 0 % (ref 0.0–0.2)

## 2023-04-16 LAB — LIPASE, BLOOD: Lipase: 29 U/L (ref 11–51)

## 2023-04-16 NOTE — ED Provider Notes (Signed)
[place] EMERGENCY DEPARTMENT AT [facility] Provider Note   CSN: [id number] Arrival date & time: 04/16/23  1531     History  Chief Complaint  Patient presents with   Abdominal Pain   Post-op Problem    Nephateria Kara [name] is a 68 y.o. female.   Abdominal Pain Patient had surgery earlier this month for perforated gastric ulcer.  Discharged 5 days ago.  Had left during the initial hospital stay AMA but then returned.  Had CT scan done at that time.  Now worried about her abdomen.  States this has had some mild pain in the mid abdomen.  States slight redness also.  No fevers.  No lightheadedness or dizziness.  No nausea or vomiting.  Patient states she does not want a full exam but just wants to leave.     Home Medications Prior to Admission medications   Medication Sig Start Date End Date Taking? Authorizing Provider  acetaminophen (TYLENOL) 325 MG tablet Take 650 mg by mouth every 4 (four) hours as needed. Patient not taking: Reported on 04/10/2023 01/30/23   [provider]  ALPRAZolam Prudy Feeler) 1 MG tablet Take 1 mg by mouth 2 (two) times daily. 10/07/19   [provider]  amphetamine-dextroamphetamine (ADDERALL XR) 25 MG 24 hr capsule Take 25 mg by mouth 2 (two) times daily. 09/30/19   [provider]  amphetamine-dextroamphetamine (ADDERALL) 20 MG tablet Take 10 mg by mouth every evening. 09/30/19   [provider]  ARIPiprazole (ABILIFY) 2 MG tablet Take 2 mg by mouth at bedtime. Patient not taking: Reported on 04/10/2023 12/22/22   [provider]  DULoxetine (CYMBALTA) 20 MG capsule Take 1 capsule (20 mg total) by mouth daily. Patient not taking: Reported on 04/07/2023 03/16/23   Erick Colace, MD  hydrOXYzine (VISTARIL) 25 MG capsule Take 25 mg by mouth 4 (four) times daily. Patient not taking: Reported on 04/07/2023 01/18/23   [provider]  lisdexamfetamine (VYVANSE) 10 MG capsule Take 10 mg by mouth daily. Patient  not taking: Reported on 04/10/2023    [provider]  lisinopril (PRINIVIL,ZESTRIL) 10 MG tablet TAKE 1 AND 1/2 TABLETS DAILY BY MOUTH Patient not taking: Reported on 04/07/2023 03/07/18   Felix Pacini A, DO  methocarbamol (ROBAXIN) 500 MG tablet Take 1 tablet (500 mg total) by mouth every 8 (eight) hours as needed for muscle spasms. 04/11/23   Emelia Loron, MD  oxyCODONE (OXY IR/ROXICODONE) 5 MG immediate release tablet Take 1 tablet (5 mg total) by mouth every 4 (four) hours as needed for moderate pain (pain score 4-6). 04/11/23   Emelia Loron, MD  pantoprazole (PROTONIX) 40 MG tablet Take 1 tablet (40 mg total) by mouth 2 (two) times daily. 04/11/23 04/10/24  Emelia Loron, MD  UNABLE TO FIND Med Name: fentanyl patch    [provider]  venlafaxine (EFFEXOR) 75 MG tablet Take 75 mg by mouth every morning.    [provider]  zolpidem (AMBIEN) 10 MG tablet Take 10 mg by mouth at bedtime as needed for sleep.    [provider]      Allergies    Aspirin, Bee venom, Ibuprofen, Ativan [lorazepam], Ketamine, Toradol [ketorolac tromethamine], and Tramadol hcl    Review of Systems   Review of Systems  Gastrointestinal:  Positive for abdominal pain.    Physical Exam Updated Vital Signs BP (!) 157/89 (BP Location: Left Arm)   Pulse 80   Temp 97.7 F (36.5 C) (Oral)  Resp 14   Ht 5\' 4"  (1.626 m)   Wt 59 kg   SpO2 98%   BMI 22.31 kg/m  Physical Exam Vitals and nursing note reviewed.  Abdominal:     Comments: Midline surgical wound with staples in space.  Slight erythema at the staples but does not necessarily appear infected.  Mild fullness on the right side without specific unexpected tenderness.  Mild bruising on abdomen.  No distention.  Neurological:     Mental Status: She is alert.     ED Results / Procedures / Treatments   Labs (all labs ordered are listed, but only abnormal results are displayed) Labs Reviewed  COMPREHENSIVE  METABOLIC PANEL - Abnormal; Notable for the following components:      Result Value   Glucose, Bld 104 (*)    Creatinine, Ser 0.31 (*)    Albumin 3.4 (*)    AST 11 (*)    All other components within normal limits  CBC - Abnormal; Notable for the following components:   WBC 10.9 (*)    RBC 3.72 (*)    Hemoglobin 10.4 (*)    HCT 29.2 (*)    MCV 78.5 (*)    Platelets 711 (*)    All other components within normal limits  LIPASE, BLOOD  URINALYSIS, ROUTINE W REFLEX MICROSCOPIC    EKG None  Radiology No results found.  Procedures Procedures    Medications Ordered in ED Medications - No data to display  ED Course/ Medical Decision Making/ A&P                                 Medical Decision Making Amount and/or Complexity of Data Reviewed Labs: ordered.   Patient with postsurgical abdominal pain.  Worried but states does not want full exam.  Did not want further blood work.  Reviewed previous discharge note.  Reviewed CT scan report from the 14th.  Reassuring.  Does appear to me that she has an expected amount of postoperative pain.  Do not think we need extensive workup and patient is overusing blood work.  Do not think we need imaging.  Can follow-up with general surgery as planned.        Final Clinical Impression(s) / ED Diagnoses Final diagnoses:  Post-op pain    Rx / DC Orders ED Discharge Orders     None         Benjiman Core, MD 04/16/23 1807

## 2023-04-16 NOTE — ED Triage Notes (Signed)
Patient to ED by POV with c/o ABD post-op. She states pain is to right of ABD and area feels "DRY" and incision area is hard. She called PCP yesterday who instructed her to take OTC Tylenol with no relief. She reports nausea but denies vomiting or diarrhea.

## 2023-04-16 NOTE — ED Notes (Signed)
PT refuse to get change into gown. Also stated she does not want an IV.

## 2023-04-16 NOTE — Discharge Instructions (Signed)
Follow-up with general surgery as planned.  Return for worsening symptoms.

## 2023-05-15 ENCOUNTER — Emergency Department (HOSPITAL_COMMUNITY)
Admission: EM | Admit: 2023-05-15 | Discharge: 2023-05-15 | Disposition: A | Discharge status: Home / Self Care | Payer: Medicare Other | Attending: Emergency Medicine | Admitting: Emergency Medicine

## 2023-05-15 ENCOUNTER — Encounter (HOSPITAL_COMMUNITY): Payer: Self-pay | Admitting: *Deleted

## 2023-05-15 ENCOUNTER — Other Ambulatory Visit: Payer: Self-pay

## 2023-05-15 DIAGNOSIS — F419 Anxiety disorder, unspecified: Secondary | ICD-10-CM | POA: Diagnosis not present

## 2023-05-15 DIAGNOSIS — F1920 Other psychoactive substance dependence, uncomplicated: Secondary | ICD-10-CM | POA: Insufficient documentation

## 2023-05-15 MED ORDER — HYDROXYZINE HCL 25 MG PO TABS
50.0000 mg | ORAL_TABLET | Freq: Once | ORAL | Status: AC
Start: 2023-05-15 — End: 2023-05-15
  Administered 2023-05-15: 50 mg via ORAL
  Filled 2023-05-15: qty 2

## 2023-05-15 MED ORDER — HYDROXYZINE HCL 25 MG PO TABS: 25.0000 mg | ORAL_TABLET | Freq: Four times a day (QID) | ORAL | 0 refills | Status: DC
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
## 2023-07-14 ENCOUNTER — Other Ambulatory Visit: Payer: Self-pay

## 2023-07-14 ENCOUNTER — Encounter (HOSPITAL_COMMUNITY): Payer: Self-pay

## 2023-07-14 ENCOUNTER — Emergency Department (HOSPITAL_COMMUNITY)
Admission: EM | Admit: 2023-07-14 | Discharge: 2023-07-14 | Discharge status: Against Medical Advice | Attending: Emergency Medicine | Admitting: Emergency Medicine

## 2023-07-14 DIAGNOSIS — F419 Anxiety disorder, unspecified: Secondary | ICD-10-CM | POA: Diagnosis present

## 2023-07-14 DIAGNOSIS — Z5329 Procedure and treatment not carried out because of patient's decision for other reasons: Secondary | ICD-10-CM | POA: Diagnosis not present

## 2023-07-14 DIAGNOSIS — Z96641 Presence of right artificial hip joint: Secondary | ICD-10-CM | POA: Insufficient documentation

## 2023-07-14 DIAGNOSIS — I10 Essential (primary) hypertension: Secondary | ICD-10-CM | POA: Diagnosis not present

## 2023-07-14 DIAGNOSIS — Z79899 Other long term (current) drug therapy: Secondary | ICD-10-CM | POA: Insufficient documentation

## 2023-07-14 LAB — CBC WITH DIFFERENTIAL/PLATELET
Abs Immature Granulocytes: 0.02 10*3/uL (ref 0.00–0.07)
Basophils Absolute: 0 10*3/uL (ref 0.0–0.1)
Basophils Relative: 1 %
Eosinophils Absolute: 0.2 10*3/uL (ref 0.0–0.5)
Eosinophils Relative: 4 %
HCT: 33.9 % — ABNORMAL LOW (ref 36.0–46.0)
Hemoglobin: 10.4 g/dL — ABNORMAL LOW (ref 12.0–15.0)
Immature Granulocytes: 0 %
Lymphocytes Relative: 38 %
Lymphs Abs: 2.2 10*3/uL (ref 0.7–4.0)
MCH: 24.6 pg — ABNORMAL LOW (ref 26.0–34.0)
MCHC: 30.7 g/dL (ref 30.0–36.0)
MCV: 80.1 fL (ref 80.0–100.0)
Monocytes Absolute: 0.5 10*3/uL (ref 0.1–1.0)
Monocytes Relative: 9 %
Neutro Abs: 2.8 10*3/uL (ref 1.7–7.7)
Neutrophils Relative %: 48 %
Platelets: 335 10*3/uL (ref 150–400)
RBC: 4.23 MIL/uL (ref 3.87–5.11)
RDW: 14.9 % (ref 11.5–15.5)
WBC: 5.8 10*3/uL (ref 4.0–10.5)
nRBC: 0 % (ref 0.0–0.2)

## 2023-07-14 LAB — COMPREHENSIVE METABOLIC PANEL WITH GFR
ALT: 11 U/L (ref 0–44)
AST: 16 U/L (ref 15–41)
Albumin: 3.5 g/dL (ref 3.5–5.0)
Alkaline Phosphatase: 103 U/L (ref 38–126)
Anion gap: 9 (ref 5–15)
BUN: 14 mg/dL (ref 8–23)
CO2: 23 mmol/L (ref 22–32)
Calcium: 9.4 mg/dL (ref 8.9–10.3)
Chloride: 106 mmol/L (ref 98–111)
Creatinine, Ser: 0.6 mg/dL (ref 0.44–1.00)
GFR, Estimated: >60 mL/min (ref >60)
Glucose, Bld: 117 mg/dL — ABNORMAL HIGH (ref 70–99)
Potassium: 3.5 mmol/L (ref 3.5–5.1)
Sodium: 138 mmol/L (ref 135–145)
Total Bilirubin: 0.4 mg/dL (ref 0.0–1.2)
Total Protein: 6.9 g/dL (ref 6.5–8.1)

## 2023-07-14 LAB — ETHANOL: Alcohol, Ethyl (B): <10 mg/dL (ref <10)

## 2023-07-14 MED ORDER — HALOPERIDOL LACTATE 5 MG/ML IJ SOLN
4.0000 mg | Freq: Once | INTRAMUSCULAR | Status: AC
  Administered 2023-07-14: 4 mg via INTRAVENOUS
  Filled 2023-07-14: qty 1

## 2023-07-14 MED ORDER — DIAZEPAM 5 MG/ML IJ SOLN
5.0000 mg | Freq: Once | INTRAMUSCULAR | Status: AC
  Administered 2023-07-14: 5 mg via INTRAVENOUS
  Filled 2023-07-14: qty 2

## 2023-07-14 NOTE — ED Notes (Addendum)
 Pt continues to yell out and sts the medication is not working.  Pt will not leave on monitoring equipment. Cardiac monitoring equipment has been reattached numerous times.

## 2023-07-14 NOTE — ED Triage Notes (Signed)
 Patient BIB EMS c/o anxiety from withdrawal of opioids after a hip surgery. Patient is screaming out occasionally and stating she can't "take the anxiety anymore." Patient reports taking a narcan  this AM.   186/100 BP 86 HR 98% RA

## 2023-07-14 NOTE — ED Notes (Addendum)
 This RN went to check on Pt and she is noted to not be in the room.  All of her belongings are gone as well.  No sign of IV removal. Attempted to call Pt and sent to VM.  VM full.

## 2023-07-14 NOTE — ED Provider Notes (Signed)
 [place] EMERGENCY DEPARTMENT AT [facility] Provider Note   CSN: [id number] Arrival date & time: 07/14/23  4098     History  Chief Complaint  Patient presents with   Withdrawal   Anxiety    Kara [name] is a 68 y.o. female.  HPI      68 year old female with a history of fibromyalgia, depression, chronic pain, hypertension, rheumatoid arthritis, degenerative disc disease, chronic right hip pain with history of right hip replacement March19, history of multiple surgeries including exploratory laparotomy with repair of perforated gastric ulcer in January with Tyrone Gallop patch, who presents with concern for anxiety.   She stopped her fentanyl  patch 3 days ago. Reports she took a naloxone  (?) she believes this morning.  She called EMS due to agitation, anxiety.  Denies SI, HI. Denies other drug use or alcohol  use. Denies any pain at this time.  No headache, chest pain, dyspnea, nausea, vomiting, fever, abdominal pain. Just feels restless, uneasy, anxious, agitated.    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   Post-nasal drip    hx of   Pre-diabetes    patient states she is not Pre-Diabetic at appt. 12/16/2018   PTSD (post-traumatic stress disorder)    RA (rheumatoid arthritis) (HCC)    autoimmune arthritis   RA (rheumatoid arthritis) (HCC)    Spondylitis (HCC)    Suicidal intent       Home Medications Prior to Admission medications   Medication Sig Start Date End Date Taking? Authorizing Provider  acetaminophen  (TYLENOL ) 325 MG tablet Take 650 mg by mouth every 4 (four) hours as needed. Patient not taking: Reported on 04/10/2023 01/30/23   [provider]  ALPRAZolam  (XANAX ) 1 MG tablet Take 1 mg by mouth 2 (two) times daily. 10/07/19   [provider]  amphetamine -dextroamphetamine  (ADDERALL  XR) 25 MG 24 hr capsule Take 25 mg by mouth 2 (two) times daily. 09/30/19   [provider]  amphetamine -dextroamphetamine  (ADDERALL ) 20 MG tablet Take 10 mg by mouth every evening. 09/30/19   [provider]  ARIPiprazole  (ABILIFY ) 2 MG tablet Take 2 mg by mouth at bedtime. Patient not taking: Reported on 04/10/2023 12/22/22   [provider]  DULoxetine  (CYMBALTA ) 20 MG capsule Take 1 capsule (20 mg total) by mouth daily. Patient not taking: Reported on 04/07/2023 03/16/23   Genetta Kenning,  MD  hydrOXYzine  (ATARAX ) 25 MG tablet Take 1 tablet (25 mg total) by mouth every 6 (six) hours. 05/15/23   Royann Cords, PA  hydrOXYzine  (VISTARIL ) 25 MG capsule Take 25 mg by mouth 4 (four) times daily. Patient not taking: Reported on 04/07/2023 01/18/23   [provider]  lisdexamfetamine (VYVANSE ) 10 MG capsule Take 10 mg by mouth daily. Patient not taking: Reported on 04/10/2023    [provider]  lisinopril  (PRINIVIL ,ZESTRIL ) 10 MG tablet TAKE 1 AND 1/2 TABLETS DAILY BY MOUTH Patient not taking: Reported on 04/07/2023 03/07/18   Napolean Backbone A, DO  methocarbamol  (ROBAXIN ) 500 MG tablet Take 1 tablet (500 mg total) by  mouth every 8 (eight) hours as needed for muscle spasms. 04/11/23   Enid Harry, MD  oxyCODONE  (OXY IR/ROXICODONE ) 5 MG immediate release tablet Take 1 tablet (5 mg total) by mouth every 4 (four) hours as needed for moderate pain (pain score 4-6). 04/11/23   Enid Harry, MD  pantoprazole  (PROTONIX ) 40 MG tablet Take 1 tablet (40 mg total) by mouth 2 (two) times daily. 04/11/23 04/10/24  Enid Harry, MD  UNABLE TO FIND Med Name: fentanyl  patch    [provider]  venlafaxine  (EFFEXOR ) 75 MG tablet Take 75 mg by mouth every morning.    [provider]  zolpidem  (AMBIEN ) 10 MG tablet Take 10 mg by mouth at bedtime as needed for sleep.    [provider]      Allergies    Aspirin, Bee venom, Ibuprofen, Ketorolac , Ativan  [lorazepam ], Ketamine , Toradol  [ketorolac  tromethamine ], Tramadol hcl, Methotrexate, and Iodine    Review of Systems   Review of Systems  Physical Exam Updated Vital Signs BP (!) 200/94   Pulse 84   Temp 97.6 F (36.4 C) (Oral)   Resp (!) 24   Ht 5\' 4"  (1.626 m)   Wt 59 kg   SpO2 100%   BMI 22.33 kg/m  Physical Exam Vitals and nursing note reviewed.  Constitutional:      General: She is not in acute distress.    Appearance: She is well-developed. She is not diaphoretic.     Comments: Agitated, difficult time sitting still Answers questions appropriately but occasionally screaming out with anxiety  HENT:     Head: Normocephalic and atraumatic.  Eyes:     Conjunctiva/sclera: Conjunctivae normal.  Cardiovascular:     Rate and Rhythm: Normal rate and regular rhythm.     Heart sounds: Normal heart sounds. No murmur heard.    No friction rub. No gallop.  Pulmonary:     Effort: Pulmonary effort is normal. No respiratory distress.     Breath sounds: Normal breath sounds. No wheezing or rales.  Abdominal:     General: There is no distension.     Palpations: Abdomen is soft.     Tenderness: There is no abdominal tenderness.  There is no guarding.  Musculoskeletal:        General: No tenderness.     Cervical back: Normal range of motion.  Skin:    General: Skin is warm and dry.     Findings: No erythema or rash.     Comments: Healing incision right hip, no surrounding erythema  Neurological:     Mental Status: She is alert and oriented to person, place, and time.     ED Results / Procedures / Treatments   Labs (all labs ordered are listed, but only abnormal results are displayed) Labs Reviewed  CBC WITH DIFFERENTIAL/PLATELET -  Abnormal; Notable for the following components:      Result Value   Hemoglobin 10.4 (*)    HCT 33.9 (*)    MCH 24.6 (*)    All other components within normal limits  COMPREHENSIVE METABOLIC PANEL WITH GFR - Abnormal; Notable for the following components:   Glucose, Bld 117 (*)    All other components within normal limits  ETHANOL    EKG EKG Interpretation Date/Time:  Saturday July 14 2023 09:08:20 EDT Ventricular Rate:  82 PR Interval:  148 QRS Duration:  82 QT Interval:  378 QTC Calculation: 442 R Axis:   18  Text Interpretation: Sinus rhythm Atrial premature complex Probable left atrial enlargement Abnormal R-wave progression, early transition No significant change since last tracing Confirmed by Scarlette Currier ([id number]) on 07/14/2023 9:27:22 AM  Radiology No results found.  Procedures Procedures    Medications Ordered in ED Medications  haloperidol  lactate (HALDOL ) injection 4 mg (4 mg Intravenous Given 07/14/23 0912)  diazepam  (VALIUM ) injection 5 mg (5 mg Intravenous Given 07/14/23 0937)  diazepam  (VALIUM ) injection 5 mg (5 mg Intravenous Given 07/14/23 1026)    ED Course/ Medical Decision Making/ A&P                                   68 year old female with a history of fibromyalgia, depression, chronic pain, hypertension, rheumatoid arthritis, degenerative disc disease, chronic right hip pain with history of right hip replacement March19, history of  multiple surgeries including exploratory laparotomy with repair of perforated gastric ulcer in January with Tyrone Gallop patch, who presents with concern for anxiety in the setting of stopping her fentanyl  patch and taking narcan  this AM.  Denies other localizing symptoms, denies SI.  EKG with normal rhythm, no acute ST changes.   Labs evaluated by me with mild anemia, no leukocytosis,no clinically significant electrolyte abnormalities, normal etoh.   Given haldol  for her anxiety and agitation.  Reports continued symptoms. Given 5mg  valium  followed by another 5mg  valium  Reports feeling improved however continued anxiety and reports she wants to get home to her pets.  I observed her ambulate with gait that she states is baseline with her recent hip surgery. Planned on dc after ambulation with walker which she states she has been using at home however she ambulated out of the ED prior to this and nursing staff did find her to remove IV.           Final Clinical Impression(s) / ED Diagnoses Final diagnoses:  Anxiety    Rx / DC Orders ED Discharge Orders     None         Scarlette Currier, MD 07/14/23 2302

## 2023-07-14 NOTE — ED Notes (Signed)
 Pt found standing in her room w/ her jacket on.  Pt sts "I either need more medication or I'm going home."  EDP made aware. Pt directed back into bed a monitoring equipment reattached.

## 2023-07-14 NOTE — ED Notes (Signed)
 Pt found sitting out ED in a wheelchair.  IV removed.  EDP made aware.

## 2023-07-14 NOTE — ED Notes (Addendum)
 Pt sts she is not feeling any better, but she is screaming and thrashing around less.  Will inform EDP.

## 2023-07-23 ENCOUNTER — Emergency Department (HOSPITAL_COMMUNITY)
Admission: EM | Admit: 2023-07-23 | Discharge: 2023-07-23 | Disposition: A | Discharge status: Home / Self Care | Attending: Emergency Medicine | Admitting: Emergency Medicine

## 2023-07-23 ENCOUNTER — Emergency Department (HOSPITAL_COMMUNITY)

## 2023-07-23 ENCOUNTER — Other Ambulatory Visit: Payer: Self-pay

## 2023-07-23 DIAGNOSIS — I10 Essential (primary) hypertension: Secondary | ICD-10-CM | POA: Insufficient documentation

## 2023-07-23 DIAGNOSIS — Z96641 Presence of right artificial hip joint: Secondary | ICD-10-CM | POA: Diagnosis not present

## 2023-07-23 DIAGNOSIS — R451 Restlessness and agitation: Secondary | ICD-10-CM | POA: Insufficient documentation

## 2023-07-23 DIAGNOSIS — M25551 Pain in right hip: Secondary | ICD-10-CM | POA: Diagnosis present

## 2023-07-23 LAB — CBC
HCT: 35.9 % — ABNORMAL LOW (ref 36.0–46.0)
Hemoglobin: 10.4 g/dL — ABNORMAL LOW (ref 12.0–15.0)
MCH: 24.5 pg — ABNORMAL LOW (ref 26.0–34.0)
MCHC: 29 g/dL — ABNORMAL LOW (ref 30.0–36.0)
MCV: 84.7 fL (ref 80.0–100.0)
Platelets: 377 10*3/uL (ref 150–400)
RBC: 4.24 MIL/uL (ref 3.87–5.11)
RDW: 15.3 % (ref 11.5–15.5)
WBC: 8.1 10*3/uL (ref 4.0–10.5)
nRBC: 0 % (ref 0.0–0.2)

## 2023-07-23 LAB — BASIC METABOLIC PANEL WITH GFR
Anion gap: 8 (ref 5–15)
BUN: 20 mg/dL (ref 8–23)
CO2: 27 mmol/L (ref 22–32)
Calcium: 9.2 mg/dL (ref 8.9–10.3)
Chloride: 103 mmol/L (ref 98–111)
Creatinine, Ser: 0.58 mg/dL (ref 0.44–1.00)
GFR, Estimated: >60 mL/min (ref >60)
Glucose, Bld: 107 mg/dL — ABNORMAL HIGH (ref 70–99)
Potassium: 4.3 mmol/L (ref 3.5–5.1)
Sodium: 138 mmol/L (ref 135–145)

## 2023-07-23 MED ORDER — FENTANYL CITRATE PF 50 MCG/ML IJ SOSY
50.0000 ug | PREFILLED_SYRINGE | Freq: Once | INTRAMUSCULAR | Status: AC
  Administered 2023-07-23: 50 ug via INTRAVENOUS
  Filled 2023-07-23: qty 1

## 2023-07-23 MED ORDER — OXYCODONE HCL 5 MG PO TABS: 5.0000 mg | ORAL_TABLET | ORAL | 0 refills | Status: AC | PRN

## 2023-07-23 MED ORDER — ZIPRASIDONE MESYLATE 20 MG IM SOLR
10.0000 mg | Freq: Once | INTRAMUSCULAR | Status: AC
  Administered 2023-07-23: 10 mg via INTRAMUSCULAR
  Filled 2023-07-23: qty 20

## 2023-07-23 MED ORDER — DIAZEPAM 5 MG/ML IJ SOLN
5.0000 mg | Freq: Once | INTRAMUSCULAR | Status: AC
  Administered 2023-07-23: 5 mg via INTRAVENOUS
  Filled 2023-07-23: qty 2

## 2023-07-23 MED ORDER — STERILE WATER FOR INJECTION IJ SOLN
INTRAMUSCULAR | Status: AC
  Administered 2023-07-23: 10 mL
  Filled 2023-07-23: qty 10

## 2023-07-23 NOTE — ED Provider Notes (Signed)
  Accepted handoff at shift change from Lorin Roemhildt PA-C. Please see prior provider note for more detail.   Briefly: Patient is 68 y.o. "complaining of ongoing right hip pain. She states that she has been in excruciating pain intermittently since her surgery. She states that she had some fentanyl  patches from a previous surgery that she had been using. The current patch that she has on right now has been there for 5 to 6 days. She states that she was not given anything else for pain. She is post to follow-up with orthopedic surgeon at the end of next week. She is tearful, and states if she had known the pain she would go through she would not have gone through with the procedure."  DDX: concern for:  fracture, dislocation, ligamentous injury, post-operative pain, post-op complication   Plan:  - dispo pending consult with [facility] ortho provider Dr. Rosamond Comes.  - Dr. Rosamond Comes requesting crutches and pain management for the patient. He would like to see the patient in his office first thing tomorrow morning for follow up appointment and possible surgery on Wednesday. He does not believe that this needs emergent surgery today since patient has been walking on this hip for while now. Shared all results with patient. Answered all questions. Patient agrees to follow up with Dr. Elicia Ground office tomorrow morning. Patient is happy with this plan since she was requesting to be discharged anyways but was hoping to hear from Dr. Rosamond Comes before discharge. - Patient with stable ambulatory gait to bathroom in ED. - Patient afebrile with stable vitals.  Provided with return precautions.  Discharged in good condition.   Dauphin Bureau, [place] 07/23/23 Vernon Goodpasture, MD 07/24/23 1520

## 2023-07-23 NOTE — ED Notes (Addendum)
 Patient was given nutrition and fluids she became upset due to this being her only options nurse explained that if she is here for lunch a tray may be ordered she verbalized understanding.

## 2023-07-23 NOTE — ED Notes (Signed)
Ortho tech called for crutches 

## 2023-07-23 NOTE — ED Notes (Signed)
 Patient son-in-Hoppel is at bedside requesting that patient be discharged, provider aware

## 2023-07-23 NOTE — ED Notes (Signed)
 Patient threw nutrition and fluids that was given to her along with her personal cell phone. She got up from stretcher yelling and screaming and being uncooperative, security called and is currently at bedside. Provider aware

## 2023-07-23 NOTE — ED Notes (Signed)
 Patient yelling at son who is at bedside that she wants to be discharged son and staff has explained care plan to patient, she is yelling and screaming that she does not want to stay here.

## 2023-07-23 NOTE — Progress Notes (Signed)
 Orthopedic Tech Progress Note Patient Details:  Kara [name] [birth year] [id number]  Patient ID: Kara [name], female   DOB: 01-02-1956, 68 y.o.   MRN: [id number] Order received for crutches.  After conversation with patient and her son and pt being approximately 6 weeks post hip surgery.  Recommend use of RW at home which she has been using already.  Discussed this with nursing and pt/family and they agree walker will be safer option secondary to decreased balance and increased right hip pain.     Margeret Stachnik OTR/L 07/23/2023, 6:17 PM

## 2023-07-23 NOTE — Discharge Instructions (Signed)
 It was a pleasure caring for you today.  As discussed, please call or go to Dr. Elicia Ground office first thing in the morning. Seek emergency care if experiencing any new or worsening symptoms.  Alternating between 650 mg Tylenol  and 400 mg Advil: The best way to alternate taking Acetaminophen  (example Tylenol ) and Ibuprofen (example Advil/Motrin) is to take them 3 hours apart. For example, if you take ibuprofen at 6 am you can then take Tylenol  at 9 am. You can continue this regimen throughout the day, making sure you do not exceed the recommended maximum dose for each drug.

## 2023-07-23 NOTE — ED Notes (Signed)
 Per PT she has a walker at home that works fine son says she can use that but no for the crutches due to safety

## 2023-07-23 NOTE — ED Provider Notes (Signed)
 [place] EMERGENCY DEPARTMENT AT [facility] Provider Note   CSN: [id number] Arrival date & time: 07/23/23  4098     History  No chief complaint on file.   Kara [name] is a 68 y.o. female with history of fibromyalgia, depression, chronic pain, hypertension, rheumatoid arthritis, degenerative disc disease, chronic right hip pain with right hip replacement on 3/19, who presents the emergency department complaining of ongoing right hip pain.  She states that she has been in excruciating pain intermittently since her surgery.  She states that she had some fentanyl  patches from a previous surgery that she had been using.  The current patch that she has on right now has been there for 5 to 6 days.  She states that she was not given anything else for pain.  She is post to follow-up with orthopedic surgeon at the end of next week.  She is tearful, and states if she had known the pain she would go through she would not have gone through with the procedure.  HPI     Home Medications Prior to Admission medications   Medication Sig Start Date End Date Taking? Authorizing Provider  acetaminophen  (TYLENOL ) 325 MG tablet Take 650 mg by mouth every 4 (four) hours as needed. Patient not taking: Reported on 04/10/2023 01/30/23   [provider]  ALPRAZolam  (XANAX ) 1 MG tablet Take 1 mg by mouth 2 (two) times daily. 10/07/19   [provider]  amphetamine -dextroamphetamine  (ADDERALL  XR) 25 MG 24 hr capsule Take 25 mg by mouth 2 (two) times daily. 09/30/19   [provider]  amphetamine -dextroamphetamine  (ADDERALL ) 20 MG tablet Take 10 mg by mouth every evening. 09/30/19   [provider]  ARIPiprazole  (ABILIFY ) 2 MG tablet Take 2 mg by mouth at bedtime. Patient not taking: Reported on 04/10/2023 12/22/22   [provider]  DULoxetine  (CYMBALTA ) 20 MG capsule Take 1 capsule (20 mg total) by mouth daily. Patient not taking: Reported on 04/07/2023 03/16/23    Genetta Kenning, MD  hydrOXYzine  (ATARAX ) 25 MG tablet Take 1 tablet (25 mg total) by mouth every 6 (six) hours. 05/15/23   Royann Cords, PA  hydrOXYzine  (VISTARIL ) 25 MG capsule Take 25 mg by mouth 4 (four) times daily. Patient not taking: Reported on 04/07/2023 01/18/23   [provider]  lisdexamfetamine (VYVANSE ) 10 MG capsule Take 10 mg by mouth daily. Patient not taking: Reported on 04/10/2023    [provider]  lisinopril  (PRINIVIL ,ZESTRIL ) 10 MG tablet TAKE 1 AND 1/2 TABLETS DAILY BY MOUTH Patient not taking: Reported on 04/07/2023 03/07/18   Napolean Backbone A, DO  methocarbamol  (ROBAXIN ) 500 MG tablet Take 1 tablet (500 mg total) by mouth every 8 (eight) hours as needed for muscle spasms. 04/11/23   Enid Harry, MD  oxyCODONE  (OXY IR/ROXICODONE ) 5 MG immediate release tablet Take 1 tablet (5 mg total) by mouth every 4 (four) hours as needed for moderate pain (pain score 4-6). 04/11/23   Enid Harry, MD  pantoprazole  (PROTONIX ) 40 MG tablet Take 1 tablet (40 mg total) by mouth 2 (two) times daily. 04/11/23 04/10/24  Enid Harry, MD  UNABLE TO FIND Med Name: fentanyl  patch    [provider]  venlafaxine  (EFFEXOR ) 75 MG tablet Take 75 mg by mouth every morning.    [provider]  zolpidem  (AMBIEN ) 10 MG tablet Take 10 mg by mouth at bedtime as needed for sleep.    [provider]      Allergies  Aspirin, Bee venom, Ibuprofen, Ketorolac , Ativan  [lorazepam ], Ketamine , Toradol  [ketorolac  tromethamine ], Tramadol hcl, Methotrexate, and Iodine    Review of Systems   Review of Systems  Musculoskeletal:  Positive for arthralgias.  All other systems reviewed and are negative.   Physical Exam Updated Vital Signs BP 129/89 (BP Location: Left Arm)   Pulse 72   Temp 98.3 F (36.8 C) (Oral)   Resp 20   Ht 5\' 4"  (1.626 m)   Wt 56.7 kg   SpO2 94%   BMI 21.46 kg/m  Physical Exam Vitals and nursing note reviewed.   Constitutional:      Appearance: Normal appearance.  HENT:     Head: Normocephalic and atraumatic.  Eyes:     Conjunctiva/sclera: Conjunctivae normal.  Pulmonary:     Effort: Pulmonary effort is normal. No respiratory distress.  Musculoskeletal:     Comments: Right hip with no focal tenderness, standing unassisted Patient complaining she cannot move her hands, but repeatedly clenching and unclenching fingers into fists  Skin:    General: Skin is warm and dry.  Neurological:     Mental Status: She is alert.  Psychiatric:        Attention and Perception: Attention and perception normal.        Mood and Affect: Affect is tearful.        Behavior: Behavior is agitated.    ED Results / Procedures / Treatments   Labs (all labs ordered are listed, but only abnormal results are displayed) Labs Reviewed  CBC - Abnormal; Notable for the following components:      Result Value   Hemoglobin 10.4 (*)    HCT 35.9 (*)    MCH 24.5 (*)    MCHC 29.0 (*)    All other components within normal limits  BASIC METABOLIC PANEL WITH GFR - Abnormal; Notable for the following components:   Glucose, Bld 107 (*)    All other components within normal limits    EKG None  Radiology DG Hip Unilat W or Wo Pelvis 2-3 Views Right Result Date: 07/23/2023 CLINICAL DATA:  Postop hip replacement.  Worsening pain. EXAM: DG HIP (WITH OR WITHOUT PELVIS) 2-3V RIGHT COMPARISON:  Intraoperative radiographs 06/13/2023. Additional hip radiographs 02/16/2023. FINDINGS: Patient underwent right total hip arthroplasty 06/13/2023 with a screw fixed acetabular component. The femoral component extends through the posterior cortex of the proximal right femur on the frog-leg lateral view. No displaced fracture identified. The acetabular component remains intact. There are postsurgical changes related to multilevel lumbar fusion. IMPRESSION: The femoral component of the recent right total hip arthroplasty extends through the  posterior cortex of the proximal right femur on the frog-leg lateral view. No displaced fracture identified. Recommend further evaluation with right hip CT. Electronically Signed   By: Elmon Hagedorn M.D.   On: 07/23/2023 12:40    Procedures Procedures    Medications Ordered in ED Medications  diazepam  (VALIUM ) injection 5 mg (5 mg Intravenous Given 07/23/23 1043)  ziprasidone  (GEODON ) injection 10 mg (10 mg Intramuscular Given 07/23/23 1218)  sterile water  (preservative free) injection (10 mLs  Given 07/23/23 1221)    ED Course/ Medical Decision Making/ A&P Clinical Course as of 07/23/23 1455  Mon Jul 23, 2023  1217 Patient became increasingly agitated, screaming and throwing items at [facility] of staff. Unable to be redirected, continually crying and screaming. Geodon  ordered for severe agitation [LR]  1336 Discussed with patient's son at bedside regarding results. He said patient had a fall about 3  days ago or so but he had been noticing a change in her behavior prior to that.  [LR]  1355 Consulted with orthopedic surgeon on call Dr Adrain Alar about patient's images. He recommended transferring patient for evaluation by her orthopedic surgeon as she will need revision.  [LR]  1400 Requested consultation with orthopedics at [facility], Diplomatic Services operational officer paged via physician's access line [LR]  1447 Received call from transfer line, report that Dr Rosamond Comes with orthopedics is refusing to take consult unless he can visualize patient's images. Contacted with x-ray/radiology department to help power share images for [facility]. [LR]    Clinical Course User Index [LR] Juniel Groene, Anna Kettering, PA-C                                 Medical Decision Making Amount and/or Complexity of Data Reviewed Labs: ordered. Radiology: ordered.  Risk Prescription drug management.  This patient is a 68 y.o. female  who presents to the ED for concern of right hip pain.   Differential diagnoses prior to evaluation: The  emergent differential diagnosis includes, but is not limited to,  fracture, dislocation, ligamentous injury, post-operative pain, post-op complication. This is not an exhaustive differential.   Past Medical History / Co-morbidities / Social History: fibromyalgia, depression, chronic pain, hypertension, rheumatoid arthritis, degenerative disc disease, chronic right hip pain with right hip replacement on 3/19  Additional history: Chart reviewed. Pertinent results include: Recent surgery on 3/19 with [facility], reviewed ER visit record from 4/19 where patient seemed to be presenting with similar symptoms.  Had agitation and symptoms were treated with Valium  which seemed to help her anxiety.  On chart review, patient's most recent orthopedic/forced medicine visit on 3/26 there is report that patient was very upset over not receiving more pain medication.  They were going to obtain some imaging, but patient was overall very dissatisfied with her care and left the office after yelling at staff.  Physical Exam: Physical exam performed. The pertinent findings include: Normal vitals.  Tearful on exam, intermittently shouting.  Complaining of pain in the right hip that radiates to the right thigh.  No numbness.  Patient later was screaming and throwing things at staff, required geodon  for severe agitation.   Lab Tests/Imaging studies: I personally interpreted labs/imaging and the pertinent results include: CBC and BMP at baseline.  X-ray of the right hip shows a femoral component of the recent right total hip arthroplasty extend to the posterior cortex of the proximal right femur on lateral view.  No displaced fracture identified.  I agree with the radiologist interpretation.  Medications: I ordered medication including Valium  for anxiety, and later Geodon  for severe agitation.  On reevaluation after Geodon  patient is sleeping.  I have reviewed the patients home medicines and have made adjustments as  needed.  Consultations obtained: I consulted with orthopedic surgeon Dr Adrain Alar who recommended: transfer to [facility] where surgery was done for orthopedic evaluation, could consider obtaining CT prior to transfer   Disposition: Patient discussed and care transferred to [facility] at shift change. Please see his/her note for further details regarding further ED course and disposition. Plan at time of handoff is follow up on orthopedic consult and recommendation regarding possible transfer.   Final Clinical Impression(s) / ED Diagnoses Final diagnoses:  Right hip pain  S/P total right hip arthroplasty    Rx / DC Orders ED Discharge Orders     None  Portions of this report may have been transcribed using voice recognition software. Every effort was made to ensure accuracy; however, inadvertent computerized transcription errors may be present.    Mckinna Demars T, PA-C 07/23/23 1523    Iva Mariner, MD 07/23/23 [phone]

## 2023-07-23 NOTE — ED Notes (Signed)
 Patient walking around ED going in and out of patients room staff redirecting patient but patient is uncooperative, care on-going.

## 2023-07-23 NOTE — ED Notes (Signed)
 Fluids and nutrition given to patient, son remains at bedside

## 2023-07-23 NOTE — ED Triage Notes (Signed)
 PT BIB EMS coming from home c/o hip pain from surgery on 3/19. 2 weeks after surgery started feeling weak and having pain C/o pain 10/10 near the incision. Incision looks like its healing nicely. Patient has fentanyl  patch on abd, says is 53 days old.  Hx: Utis, back surgeries. Patient crying during triage states going through a bad breakup at this time  EMS: 140/78, Hr 64, Spo2 94%, CBG 125, RR 18  20g left AC, 50mcg given en route

## 2023-07-30 ENCOUNTER — Other Ambulatory Visit: Payer: Self-pay | Admitting: Nurse Practitioner

## 2023-07-30 DIAGNOSIS — R6889 Other general symptoms and signs: Secondary | ICD-10-CM

## 2023-07-30 DIAGNOSIS — R410 Disorientation, unspecified: Secondary | ICD-10-CM

## 2023-07-30 DIAGNOSIS — R413 Other amnesia: Secondary | ICD-10-CM

## 2023-08-21 ENCOUNTER — Other Ambulatory Visit

## 2023-08-24 ENCOUNTER — Other Ambulatory Visit: Payer: Self-pay

## 2023-08-24 ENCOUNTER — Emergency Department (HOSPITAL_COMMUNITY)
Admission: EM | Admit: 2023-08-24 | Discharge: 2023-08-27 | Disposition: A | Discharge status: Other Acute Inpatient Hospital | Attending: Emergency Medicine | Admitting: Emergency Medicine

## 2023-08-24 ENCOUNTER — Encounter (HOSPITAL_COMMUNITY): Payer: Self-pay | Admitting: Psychiatry

## 2023-08-24 DIAGNOSIS — I10 Essential (primary) hypertension: Secondary | ICD-10-CM | POA: Diagnosis not present

## 2023-08-24 DIAGNOSIS — Z79899 Other long term (current) drug therapy: Secondary | ICD-10-CM | POA: Insufficient documentation

## 2023-08-24 DIAGNOSIS — R45851 Suicidal ideations: Secondary | ICD-10-CM | POA: Diagnosis not present

## 2023-08-24 DIAGNOSIS — F39 Unspecified mood [affective] disorder: Secondary | ICD-10-CM | POA: Diagnosis not present

## 2023-08-24 DIAGNOSIS — R4689 Other symptoms and signs involving appearance and behavior: Secondary | ICD-10-CM | POA: Diagnosis present

## 2023-08-24 DIAGNOSIS — R443 Hallucinations, unspecified: Secondary | ICD-10-CM

## 2023-08-24 DIAGNOSIS — F332 Major depressive disorder, recurrent severe without psychotic features: Secondary | ICD-10-CM | POA: Diagnosis not present

## 2023-08-24 DIAGNOSIS — Z96619 Presence of unspecified artificial shoulder joint: Secondary | ICD-10-CM | POA: Insufficient documentation

## 2023-08-24 DIAGNOSIS — F1920 Other psychoactive substance dependence, uncomplicated: Secondary | ICD-10-CM | POA: Diagnosis not present

## 2023-08-24 DIAGNOSIS — F1910 Other psychoactive substance abuse, uncomplicated: Secondary | ICD-10-CM | POA: Diagnosis not present

## 2023-08-24 LAB — ETHANOL: Alcohol, Ethyl (B): <15 mg/dL

## 2023-08-24 LAB — CBC
HCT: 34.7 % — ABNORMAL LOW (ref 36.0–46.0)
Hemoglobin: 10.4 g/dL — ABNORMAL LOW (ref 12.0–15.0)
MCH: 23.6 pg — ABNORMAL LOW (ref 26.0–34.0)
MCHC: 30 g/dL (ref 30.0–36.0)
MCV: 78.9 fL — ABNORMAL LOW (ref 80.0–100.0)
Platelets: 413 10*3/uL — ABNORMAL HIGH (ref 150–400)
RBC: 4.4 MIL/uL (ref 3.87–5.11)
RDW: 14.6 % (ref 11.5–15.5)
WBC: 12.5 10*3/uL — ABNORMAL HIGH (ref 4.0–10.5)
nRBC: 0 % (ref 0.0–0.2)

## 2023-08-24 LAB — COMPREHENSIVE METABOLIC PANEL WITH GFR
ALT: 12 U/L (ref 0–44)
AST: 24 U/L (ref 15–41)
Albumin: 4.3 g/dL (ref 3.5–5.0)
Alkaline Phosphatase: 132 U/L — ABNORMAL HIGH (ref 38–126)
Anion gap: 13 (ref 5–15)
BUN: 20 mg/dL (ref 8–23)
CO2: 22 mmol/L (ref 22–32)
Calcium: 9.7 mg/dL (ref 8.9–10.3)
Chloride: 99 mmol/L (ref 98–111)
Creatinine, Ser: 0.68 mg/dL (ref 0.44–1.00)
GFR, Estimated: >60 mL/min
Glucose, Bld: 120 mg/dL — ABNORMAL HIGH (ref 70–99)
Potassium: 3.6 mmol/L (ref 3.5–5.1)
Sodium: 134 mmol/L — ABNORMAL LOW (ref 135–145)
Total Bilirubin: 0.5 mg/dL (ref 0.0–1.2)
Total Protein: 8.1 g/dL (ref 6.5–8.1)

## 2023-08-24 LAB — RAPID URINE DRUG SCREEN, HOSP PERFORMED
Amphetamines: POSITIVE — AB
Barbiturates: NOT DETECTED
Benzodiazepines: POSITIVE — AB
Cocaine: NOT DETECTED
Opiates: NOT DETECTED
Tetrahydrocannabinol: NOT DETECTED

## 2023-08-24 MED ORDER — ZIPRASIDONE MESYLATE 20 MG IM SOLR
10.0000 mg | Freq: Once | INTRAMUSCULAR | Status: AC
  Administered 2023-08-24: 10 mg via INTRAMUSCULAR
  Filled 2023-08-24: qty 20

## 2023-08-24 MED ORDER — ZIPRASIDONE MESYLATE 20 MG IM SOLR
INTRAMUSCULAR | Status: AC
  Filled 2023-08-24: qty 20

## 2023-08-24 MED ORDER — DIPHENHYDRAMINE HCL 25 MG PO CAPS
50.0000 mg | ORAL_CAPSULE | Freq: Four times a day (QID) | ORAL | Status: DC | PRN
  Administered 2023-08-25 – 2023-08-26 (×2): 50 mg via ORAL
  Filled 2023-08-24 (×2): qty 2

## 2023-08-24 MED ORDER — ZIPRASIDONE MESYLATE 20 MG IM SOLR
10.0000 mg | INTRAMUSCULAR | Status: DC | PRN
  Administered 2023-08-24 – 2023-08-25 (×3): 10 mg via INTRAMUSCULAR
  Filled 2023-08-24 (×2): qty 20

## 2023-08-24 MED ORDER — STERILE WATER FOR INJECTION IJ SOLN
INTRAMUSCULAR | Status: AC
  Administered 2023-08-24: 2.1 mL
  Filled 2023-08-24: qty 10

## 2023-08-24 MED ORDER — STERILE WATER FOR INJECTION IJ SOLN
INTRAMUSCULAR | Status: AC
  Administered 2023-08-24: 0.6 mL via INTRAMUSCULAR
  Filled 2023-08-24: qty 10

## 2023-08-24 MED ORDER — DIPHENHYDRAMINE HCL 50 MG/ML IJ SOLN
50.0000 mg | Freq: Four times a day (QID) | INTRAMUSCULAR | Status: DC | PRN
  Administered 2023-08-25 (×2): 50 mg via INTRAMUSCULAR
  Filled 2023-08-24 (×2): qty 1

## 2023-08-24 NOTE — ED Notes (Signed)
 Patient oriented to TCU. Patient's belongings placed in [place] #28. Patient irate and yelling. Pt given 10mg  Geodon  IM. Tolerated well.

## 2023-08-24 NOTE — BH Assessment (Signed)
 At 2120 Varina, RN attempted to wake pt however it was unsuccessful.    Rosi Converse, MS, [facility], [facility] Triage Specialist [phone]

## 2023-08-24 NOTE — ED Notes (Signed)
 Attempted to wake Kara [name] for TTS interview but was unsuccessful pt remains asleep after receiving geodon  10 mg IM at 1742 from day shift nurse. Will attempt to wake again in 20 minutes.
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
## 2023-08-24 NOTE — ED Provider Notes (Signed)
 TTS consult placed, but no triage note, or physicians note entered, to provide why patient is here in the hospital.

## 2023-08-24 NOTE — BH Assessment (Signed)
 Patient was deferred to [facility] for a telepsych assessment. The assigned care coordinator will provide updates regarding the scheduling of the assessment. [facility] coordinator can be reached at [phone] for further information on the timing of the telepsych evaluation.
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
## 2023-08-24 NOTE — BH Assessment (Signed)
 At 2100, Per Allayne Arabian, RN the pt is currently sleeping after receiving Geodon .   At 2115, clinician asked Allayne Arabian, RN via secure message to alert TTS once pt is awake and able to engage in assessment.    Rosi Converse, MS, [facility], [facility] Triage Specialist [phone]

## 2023-08-24 NOTE — Progress Notes (Addendum)
 At 1727, TTS received an order to complete a TTS assessment. @1919 , Initial review indicated no documentation of the current ED visit. Relevant clinical documentation is now available in the chart. TTS will proceed with organizing and initiating the assessment based on current volume and clinical prioritization.
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
## 2023-08-25 DIAGNOSIS — F39 Unspecified mood [affective] disorder: Secondary | ICD-10-CM

## 2023-08-25 DIAGNOSIS — R4689 Other symptoms and signs involving appearance and behavior: Secondary | ICD-10-CM

## 2023-08-25 DIAGNOSIS — R45851 Suicidal ideations: Secondary | ICD-10-CM

## 2023-08-25 DIAGNOSIS — F1920 Other psychoactive substance dependence, uncomplicated: Secondary | ICD-10-CM

## 2023-08-25 MED ORDER — OLANZAPINE 5 MG PO TBDP: 5.0000 mg | ORAL_TABLET | Freq: Two times a day (BID) | ORAL | Status: DC

## 2023-08-25 MED ORDER — PHENOBARBITAL 32.4 MG PO TABS
64.8000 mg | ORAL_TABLET | Freq: Three times a day (TID) | ORAL | Status: AC
  Administered 2023-08-25 – 2023-08-26 (×6): 64.8 mg via ORAL
  Filled 2023-08-25 (×7): qty 2

## 2023-08-25 MED ORDER — OLANZAPINE 5 MG PO TBDP
ORAL_TABLET | ORAL | Status: AC
  Filled 2023-08-25: qty 1

## 2023-08-25 MED ORDER — ACETAMINOPHEN 325 MG PO TABS
ORAL_TABLET | ORAL | Status: AC
  Administered 2023-08-25: 325 mg
  Filled 2023-08-25: qty 1

## 2023-08-25 MED ORDER — STERILE WATER FOR INJECTION IJ SOLN
INTRAMUSCULAR | Status: AC
  Administered 2023-08-25: 10 mL
  Filled 2023-08-25: qty 10

## 2023-08-25 MED ORDER — PHENOBARBITAL 32.4 MG PO TABS: 16.2000 mg | ORAL_TABLET | Freq: Three times a day (TID) | ORAL | Status: DC

## 2023-08-25 MED ORDER — FOLIC ACID 1 MG PO TABS
1.0000 mg | ORAL_TABLET | Freq: Every day | ORAL | Status: DC
  Administered 2023-08-25 – 2023-08-27 (×2): 1 mg via ORAL
  Filled 2023-08-25 (×3): qty 1

## 2023-08-25 MED ORDER — ADULT MULTIVITAMIN W/MINERALS CH
1.0000 | ORAL_TABLET | Freq: Every day | ORAL | Status: DC
  Administered 2023-08-25 – 2023-08-27 (×2): 1 via ORAL
  Filled 2023-08-25 (×3): qty 1

## 2023-08-25 MED ORDER — STERILE WATER FOR INJECTION IJ SOLN
INTRAMUSCULAR | Status: AC
  Filled 2023-08-25: qty 10

## 2023-08-25 MED ORDER — OLANZAPINE 5 MG PO TBDP
5.0000 mg | ORAL_TABLET | Freq: Two times a day (BID) | ORAL | Status: DC
  Administered 2023-08-26: 5 mg via ORAL
  Filled 2023-08-25 (×3): qty 1

## 2023-08-25 MED ORDER — PHENOBARBITAL 32.4 MG PO TABS
32.4000 mg | ORAL_TABLET | Freq: Three times a day (TID) | ORAL | Status: DC
  Administered 2023-08-27: 32.4 mg via ORAL
  Filled 2023-08-25: qty 1

## 2023-08-25 MED ORDER — THIAMINE MONONITRATE 100 MG PO TABS
100.0000 mg | ORAL_TABLET | Freq: Every day | ORAL | Status: DC
  Administered 2023-08-25 – 2023-08-27 (×2): 100 mg via ORAL
  Filled 2023-08-25 (×3): qty 1

## 2023-08-25 NOTE — ED Notes (Signed)
 Pt tearful at random times screaming orders at staff. Pt threw pillows out of room and tried to tip over bedside table.  Removed table from room.

## 2023-08-25 NOTE — ED Notes (Signed)
 Pt refused to allow staff to perform EKG
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
## 2023-08-25 NOTE — Progress Notes (Signed)
 TOC consulted for substance abuse resources. Resources attached to AVS. Pt is under TTS care at this time. No further TOC needs. TOC signing off.
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
## 2023-08-25 NOTE — BH Assessment (Addendum)
 Disposition: Dr. Les Rao, PhD, MSN, APRN, PMHNP-BC, [facility] recommends psychiatric hospitalization. CSW to seek placement.     Rosi Converse, MS, [facility], [facility] Triage Specialist [phone]

## 2023-08-25 NOTE — Progress Notes (Signed)
 Patient has been denied by [facility] due to no appropriate beds available. Patient meets BH inpatient criteria per Arn Beth, NP. Patient has been faxed out to the following facilities:   [facility] [address] [place] [phone]--BMWUX-LKGMWNUU [place]ending - Request [address], [place] [phone]--PIRJJ-OACZYSAY [facility] - Request Sent--[address] [place], [place] [phone]--CBJSE-GBTDVV Health-Behavioral Health Patient PlacementPending - Request [facility], [phone]--[facility] - Request [address] Devota Fontan [facility] [phone]--JIRCV-ELF [facility] HealthPending - Request Sent--[address] [place] [phone]--ERXVQ-MGQ Ellan Gunner - Request Sent--[address], [phone]--[facility] - Request [address] [place] [phone]--NKNLZ-JQBHA [facility] HealthPending - Request [address] [place] [phone]--DQQIW-LNLGX [facility] - Request Sent--[address] [place] [phone]--OVZCH-YIFOYDX [facility] - Request [address] [place], [place] [phone]--UTMLY-YTKPTWSF [place]ending - Request [address] [place] [phone]--GYKZL-DJTTSVX [facility] HealthPending - Request [address], [place] [phone]--GYBWL[facility]SL[facility] - Request Sent--[address], [place] [phone]--GTXMI-WOEHOZY [facility] - Request Sent--[address] [place]ia Cogan [facility] [phone]--[facility] HealthcarePending - Request [address] [place] [phone]--  Phares Brasher, MSW, LCSW-A  1:37 PM 08/25/2023
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
## 2023-08-25 NOTE — Progress Notes (Signed)
 Pt has had breakfast and is currently resting comfortably.

## 2023-08-25 NOTE — ED Notes (Signed)
 Pt scheduled for a 2300 interview with TTS
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
## 2023-08-26 DIAGNOSIS — F332 Major depressive disorder, recurrent severe without psychotic features: Secondary | ICD-10-CM

## 2023-08-26 MED ORDER — TRAZODONE HCL 50 MG PO TABS: 50.0000 mg | ORAL_TABLET | Freq: Every evening | ORAL | Status: DC | PRN

## 2023-08-26 MED ORDER — HYDROXYZINE HCL 10 MG PO TABS
10.0000 mg | ORAL_TABLET | Freq: Four times a day (QID) | ORAL | Status: DC | PRN
  Administered 2023-08-26 – 2023-08-27 (×2): 10 mg via ORAL
  Filled 2023-08-26 (×2): qty 1

## 2023-08-26 MED ORDER — QUETIAPINE FUMARATE 25 MG PO TABS
25.0000 mg | ORAL_TABLET | Freq: Three times a day (TID) | ORAL | Status: DC
  Administered 2023-08-26 – 2023-08-27 (×3): 25 mg via ORAL
  Filled 2023-08-26 (×3): qty 1

## 2023-08-26 MED ORDER — DONEPEZIL HCL 5 MG PO TABS
5.0000 mg | ORAL_TABLET | Freq: Every day | ORAL | Status: DC
  Administered 2023-08-26: 5 mg via ORAL
  Filled 2023-08-26: qty 1

## 2023-08-26 MED ORDER — ACETAMINOPHEN 500 MG PO TABS
ORAL_TABLET | ORAL | Status: AC
  Administered 2023-08-26: 500 mg
  Filled 2023-08-26: qty 1

## 2023-08-26 MED ORDER — LOPERAMIDE HCL 2 MG PO CAPS
4.0000 mg | ORAL_CAPSULE | Freq: Once | ORAL | Status: AC
  Administered 2023-08-26: 4 mg via ORAL
  Filled 2023-08-26: qty 2

## 2023-08-26 MED ORDER — ACETAMINOPHEN 325 MG PO TABS
650.0000 mg | ORAL_TABLET | Freq: Four times a day (QID) | ORAL | Status: DC | PRN
  Administered 2023-08-26 – 2023-08-27 (×2): 650 mg via ORAL
  Filled 2023-08-26 (×2): qty 2
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
## 2023-08-26 NOTE — Progress Notes (Signed)
 Patient has been denied by [facility] due to no appropriate beds available. Patient meets BH inpatient criteria per [name], NP. Patient has been faxed out to the following facilities:   [facility] [address] [place] [place] [address] [phone]  [facility] [address], [place] [address] [phone]  [facility] [address] [place], [place] [address] [phone]  [facility] Patient Placement [facility], [place] [phone]  [facility] [address] [place] [address] [phone]  [facility] [address] [place] [address] [phone]  [facility] EFAX [address] [place], [place] [phone]  [facility] [address] Johnella Naas [place] [address] [phone]  [facility] [address] [place] [address] [phone]  [facility] [address] [place] [address] [phone]  [facility] [address] [place], [place] [address] [phone]  [facility] [address] [place] [address] [phone]  [facility] [address], [place] [address] [phone]  [facility] [address] [place] [address] [phone]  [facility] [address], [place] [address] [phone]  [facility] [address], [place] [address] [phone]   Phares Brasher, MSW, LCSW-A  1:34 PM 08/26/2023

## 2023-08-26 NOTE — ED Notes (Signed)
 Pt accepted to [name]      Patient meets inpatient criteria per [name], NP   The attending provider will be Dr. Lisle Ridges   Call report to [phone]      Pt scheduled  to arrive at [facility] TOMORROW (6/2).

## 2023-08-26 NOTE — Progress Notes (Signed)
 [facility] LCSW Progress Note   08/26/2023    3:48 PM  [name]   [id number]   Type of Contact and Topic:  Psychiatric Bed Placement   Pt accepted to [facility]     Patient meets inpatient criteria per Kara Latin, NP  The attending provider will be Dr. Lisle Ridges  Call report to [phone]  Thaddeus Filippo, Paramedic @ [facility] ED notified.     Pt scheduled  to arrive at [facility] TOMORROW (6/2).    Hearl Heikes, MSW, LCSW-A  3:49 PM 08/26/2023

## 2023-08-27 MED ORDER — VENLAFAXINE HCL 75 MG PO TABS
75.0000 mg | ORAL_TABLET | Freq: Every day | ORAL | Status: DC
  Administered 2023-08-27: 75 mg via ORAL
  Filled 2023-08-27: qty 1

## 2023-08-27 NOTE — ED Notes (Signed)
Pt refused breakfast tray.  
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
## 2023-09-04 ENCOUNTER — Inpatient Hospital Stay: Admission: RE | Admit: 2023-09-04 | Source: Ambulatory Visit

## 2023-10-15 ENCOUNTER — Other Ambulatory Visit (HOSPITAL_BASED_OUTPATIENT_CLINIC_OR_DEPARTMENT_OTHER): Payer: Self-pay | Admitting: Nurse Practitioner

## 2023-10-15 DIAGNOSIS — Z780 Asymptomatic menopausal state: Secondary | ICD-10-CM

## 2023-10-15 DIAGNOSIS — E2839 Other primary ovarian failure: Secondary | ICD-10-CM

## 2023-12-13 ENCOUNTER — Encounter: Payer: Self-pay | Admitting: Gastroenterology

## 2023-12-18 NOTE — Progress Notes (Signed)
 Patient Name: Kara [name] MR#: [id number]   Subjective:   HPI (12/18/2023):  History of Present Illness The patient is a 68 year old female who presents for right shoulder pain with a history of right shoulder arthroplasty by Dr. Josefina in 2013.  She reports experiencing pain in both shoulders, with the right shoulder causing discomfort during sleep. She has not undergone any surgical procedures on her left shoulder. She maintains good mobility in her right shoulder but experiences pain. She suspects that prolonged use of a walker prior to her surgery may have caused some damage to the tendon or connective tissue. She was referred to a specialist by Dr. Josefina due to her history of 14 surgeries. She is currently taking Tylenol  for pain management but is cautious due to potential liver damage. Her sleeping position has changed due to the shoulder pain, as she can no longer sleep on her side. She underwent a right shoulder replacement performed by Dr. Josefina in 2013. She also had a hip replacement surgery performed by Dr. Weyman. 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  She has a fused spine and neck, which limits her neck mobility. X-rays taken during a visit to Dr. Josefina revealed misaligned discs and stenosis.  She has been diagnosed with fibromyalgia, chronic fatigue, arthritis, and deformity. She is currently experiencing a flare-up of her fibromyalgia and chronic fatigue, which is challenging as she is still working.  She has rheumatoid arthritis (RA) that affects her body, particularly the cartilage.  She has degenerative disease and has undergone an MRI in the past.  She had an operation on 04/10/2023 due to excessive ibuprofen use for her hip, which resulted in a stomach ulcer. She had lab work done by her primary care physician last week due to a 19-month history of diarrhea.  PAST SURGICAL HISTORY: Right shoulder arthroplasty (2013) Hip replacement  surgery Operation for stomach ulcer (04/10/2023) Fused spine and neck  SOCIAL HISTORY Occupations: Working    BMI Readings from Last 1 Encounters:  09/11/23 22.97 kg/m   Objective:  Physical Exam:  Right shoulder: Skin intact, healed incision AROM is 170/40/L1 SILT Ax/rad/med/ulnar + motor grip/EPL/IO Hand WWP, palpable radial pulse  Diagnostic Studies:   X-rays of the right shoulder reviewed and interpreted, demonstrating long stemmed anatomic shoulder arthroplasty with hybrid glenoid component and anterior subluxation with 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 female with painful right shoulder arthroplasty, also with left shoulder pain and neck pain  Medical Decision Making:  Unfortunately she has a few different issues going on.  We are gena defer evaluation of her native left shoulder until another visit.  She would like to proceed with workup of her painful right shoulder arthroplasty.  She does have preserved active range of motion with this despite apparent subscapularis insufficiency.  I recommend next operative workup being CT scan and inflammatory markers.  In terms of her neck pain and history of cervical spine surgery, we will provide her with a referral for spine surgery  Plan of care:   CT scan and inflammatory markers for workup of the painful right shoulder arthroplasty Evaluation of left native shoulder at next visit with radiographs She is not a surgical candidate for me in the future Spine referral for cervical spine pain and limited range of motion Follow up: After imaging completed

## 2023-12-19 NOTE — Telephone Encounter (Signed)
 Patient is calling to request a neck CT order.  (Patient has been scheduled for right shoulder CT 12/26/2023)   Please call patient back at [phone] (okay to leave detailed voicemail)

## 2023-12-23 ENCOUNTER — Emergency Department (HOSPITAL_COMMUNITY)

## 2023-12-23 ENCOUNTER — Other Ambulatory Visit: Payer: Self-pay

## 2023-12-23 ENCOUNTER — Emergency Department (HOSPITAL_COMMUNITY)
Admission: EM | Admit: 2023-12-23 | Discharge: 2023-12-23 | Disposition: A | Discharge status: Home / Self Care | Attending: Emergency Medicine | Admitting: Emergency Medicine

## 2023-12-23 ENCOUNTER — Encounter (HOSPITAL_COMMUNITY): Payer: Self-pay

## 2023-12-23 DIAGNOSIS — R1084 Generalized abdominal pain: Secondary | ICD-10-CM

## 2023-12-23 DIAGNOSIS — N390 Urinary tract infection, site not specified: Secondary | ICD-10-CM | POA: Diagnosis not present

## 2023-12-23 DIAGNOSIS — Z79899 Other long term (current) drug therapy: Secondary | ICD-10-CM | POA: Insufficient documentation

## 2023-12-23 LAB — URINALYSIS, ROUTINE W REFLEX MICROSCOPIC
Bilirubin Urine: NEGATIVE
Glucose, UA: NEGATIVE mg/dL
Hgb urine dipstick: NEGATIVE
Ketones, ur: NEGATIVE mg/dL
Nitrite: NEGATIVE
Protein, ur: NEGATIVE mg/dL
Specific Gravity, Urine: 1.036 — ABNORMAL HIGH (ref 1.005–1.030)
pH: 5 (ref 5.0–8.0)

## 2023-12-23 LAB — CBC WITH DIFFERENTIAL/PLATELET
Abs Immature Granulocytes: 0.04 K/uL (ref 0.00–0.07)
Basophils Absolute: 0 K/uL (ref 0.0–0.1)
Basophils Relative: 0 %
Eosinophils Absolute: 0.1 K/uL (ref 0.0–0.5)
Eosinophils Relative: 2 %
HCT: 41.4 % (ref 36.0–46.0)
Hemoglobin: 12.6 g/dL (ref 12.0–15.0)
Immature Granulocytes: 1 %
Lymphocytes Relative: 23 %
Lymphs Abs: 1.6 K/uL (ref 0.7–4.0)
MCH: 25.4 pg — ABNORMAL LOW (ref 26.0–34.0)
MCHC: 30.4 g/dL (ref 30.0–36.0)
MCV: 83.3 fL (ref 80.0–100.0)
Monocytes Absolute: 0.5 K/uL (ref 0.1–1.0)
Monocytes Relative: 8 %
Neutro Abs: 4.8 K/uL (ref 1.7–7.7)
Neutrophils Relative %: 66 %
Platelets: 420 K/uL — ABNORMAL HIGH (ref 150–400)
RBC: 4.97 MIL/uL (ref 3.87–5.11)
RDW: 16 % — ABNORMAL HIGH (ref 11.5–15.5)
WBC: 7.1 K/uL (ref 4.0–10.5)
nRBC: 0 % (ref 0.0–0.2)

## 2023-12-23 LAB — COMPREHENSIVE METABOLIC PANEL WITH GFR
ALT: 13 U/L (ref 0–44)
AST: 21 U/L (ref 15–41)
Albumin: 4.3 g/dL (ref 3.5–5.0)
Alkaline Phosphatase: 131 U/L — ABNORMAL HIGH (ref 38–126)
Anion gap: 11 (ref 5–15)
BUN: 17 mg/dL (ref 8–23)
CO2: 24 mmol/L (ref 22–32)
Calcium: 9.8 mg/dL (ref 8.9–10.3)
Chloride: 101 mmol/L (ref 98–111)
Creatinine, Ser: 0.63 mg/dL (ref 0.44–1.00)
GFR, Estimated: >60 mL/min (ref >60)
Glucose, Bld: 120 mg/dL — ABNORMAL HIGH (ref 70–99)
Potassium: 4.4 mmol/L (ref 3.5–5.1)
Sodium: 136 mmol/L (ref 135–145)
Total Bilirubin: <0.2 mg/dL (ref 0.0–1.2)
Total Protein: 7.1 g/dL (ref 6.5–8.1)

## 2023-12-23 LAB — LIPASE, BLOOD: Lipase: 16 U/L (ref 11–51)

## 2023-12-23 MED ORDER — SODIUM CHLORIDE 0.9 % IV BOLUS
1000.0000 mL | Freq: Once | INTRAVENOUS | Status: AC
  Administered 2023-12-23: 1000 mL via INTRAVENOUS

## 2023-12-23 MED ORDER — IOHEXOL 300 MG/ML  SOLN
100.0000 mL | Freq: Once | INTRAMUSCULAR | Status: AC | PRN
  Administered 2023-12-23: 100 mL via INTRAVENOUS

## 2023-12-23 MED ORDER — OXYCODONE-ACETAMINOPHEN 5-325 MG PO TABS
1.0000 | ORAL_TABLET | Freq: Once | ORAL | Status: AC
  Administered 2023-12-23: 1 via ORAL
  Filled 2023-12-23: qty 1

## 2023-12-23 MED ORDER — ONDANSETRON HCL 4 MG/2ML IJ SOLN
4.0000 mg | Freq: Once | INTRAMUSCULAR | Status: AC
  Administered 2023-12-23: 4 mg via INTRAVENOUS
  Filled 2023-12-23: qty 2

## 2023-12-23 MED ORDER — DICYCLOMINE HCL 20 MG PO TABS: 20.0000 mg | ORAL_TABLET | Freq: Two times a day (BID) | ORAL | 0 refills | Status: AC

## 2023-12-23 MED ORDER — MORPHINE SULFATE (PF) 4 MG/ML IV SOLN
4.0000 mg | Freq: Once | INTRAVENOUS | Status: AC
  Administered 2023-12-23: 4 mg via INTRAVENOUS
  Filled 2023-12-23: qty 1

## 2023-12-23 MED ORDER — CEPHALEXIN 500 MG PO CAPS: 500.0000 mg | ORAL_CAPSULE | Freq: Two times a day (BID) | ORAL | 0 refills | Status: AC

## 2023-12-23 NOTE — Discharge Instructions (Addendum)
 Follow up with your primary care provider for recheck in 2 days. Return to the ER for worsening or concerning symptoms.  Take Keflex  as prescribed for urinary tract infection.  Take Bentyl as needed as prescribed for pain.

## 2023-12-23 NOTE — ED Triage Notes (Addendum)
 Pt bib EMS from home for pelvic pain x3 days says it feels like fibromyalgia flare up. Pt was asleep upon EMS arrival. Pt denies taking any pain meds or over the counter medications. Diarrhea x2 months. BP 110/82 HR 70 RR 15 O2 98%. A&Ox4.

## 2023-12-23 NOTE — ED Provider Notes (Signed)
  EMERGENCY DEPARTMENT AT [facility] Provider Note   CSN: [id number] Arrival date & time: 12/23/23  9045     Patient presents with: Pelvic Pain and Diarrhea   Kara [name] is a 68 y.o. female.   68 year old female with past medical history of fibromyalgia, multiple orthopedic surgeries, gastric ulcer from NSAID use, osteoporosis, hypertension, kidney stones, rheumatoid arthritis presents with complaint of left side abdominal pain x 4 days which radiates to both sides of her back.  Not associated with nausea, vomiting, changes in bowel or bladder habits.  Last bowel movement was yesterday.  States that she did have diarrhea for 6 months however this is finally resolved and she had a formed stool yesterday which was nonbloody.  Patient reports history of chronic pain but states pain in the side of her abdomen is new.  States that she has had a colonoscopy in the past, unknown results, unknown how long ago.       Prior to Admission medications   Medication Sig Start Date End Date Taking? Authorizing Provider  cephALEXin  (KEFLEX ) 500 MG capsule Take 1 capsule (500 mg total) by mouth 2 (two) times daily for 5 days. 12/23/23 12/28/23 Yes Beverley Leita LABOR, PA-C  dicyclomine (BENTYL) 20 MG tablet Take 1 tablet (20 mg total) by mouth 2 (two) times daily. 12/23/23  Yes Beverley Leita LABOR, PA-C  ALPRAZolam  (XANAX ) 1 MG tablet Take 1 mg by mouth at bedtime. 10/07/19   [provider]  amphetamine -dextroamphetamine  (ADDERALL  XR) 25 MG 24 hr capsule Take 25 mg by mouth 2 (two) times daily. 09/30/19   [provider]  amphetamine -dextroamphetamine  (ADDERALL ) 20 MG tablet Take 20 mg by mouth every evening. 09/30/19   [provider]  ARIPiprazole  (ABILIFY ) 10 MG tablet Take 10 mg by mouth at bedtime.    [provider]  cyanocobalamin (VITAMIN B12) 1000 MCG/ML injection Inject 1,000 mcg into the muscle once a week.    [provider]  donepezil   (ARICEPT ) 5 MG tablet Take 5 mg by mouth at bedtime.    [provider]  ferrous sulfate 325 (65 FE) MG tablet Take 325 mg by mouth daily.    [provider]  meloxicam  (MOBIC ) 15 MG tablet Take 15 mg by mouth daily.    [provider]  methocarbamol  (ROBAXIN ) 500 MG tablet Take 1 tablet (500 mg total) by mouth every 8 (eight) hours as needed for muscle spasms. Patient taking differently: Take 500 mg by mouth daily as needed for muscle spasms. 04/11/23   Ebbie Cough, MD  oxyCODONE  (ROXICODONE ) 5 MG immediate release tablet Take 1 tablet (5 mg total) by mouth every 4 (four) hours as needed for up to 5 doses for severe pain (pain score 7-10). Patient not taking: Reported on 08/25/2023 07/23/23   [name], Savannah F, PA-C  oxyCODONE -acetaminophen  (PERCOCET/ROXICET) 5-325 MG tablet Take 1 tablet by mouth every 4 (four) hours as needed for severe pain (pain score 7-10). Patient not taking: Reported on 08/25/2023    [provider]  pantoprazole  (PROTONIX ) 40 MG tablet Take 1 tablet (40 mg total) by mouth 2 (two) times daily. Patient not taking: Reported on 08/25/2023 04/11/23 04/10/24  Ebbie Cough, MD  QUEtiapine  (SEROQUEL ) 25 MG tablet Take 25-75 mg by mouth at bedtime.    [provider]  rivaroxaban (XARELTO) 10 MG TABS tablet Take 10 mg by mouth daily. Patient not taking: Reported on 08/25/2023    [provider]  traZODone  (DESYREL ) 50 MG  tablet Take 150 mg by mouth at bedtime.    [provider]  tretinoin  (RETIN-A ) 0.05 % cream Apply 1 Application topically daily.    [provider]  venlafaxine  (EFFEXOR ) 75 MG tablet Take 75 mg by mouth every morning.    [provider]  Vitamin D , Ergocalciferol , (DRISDOL ) 1.25 MG (50000 UNIT) CAPS capsule Take 50,000 Units by mouth every 7 (seven) days.    [provider]  zolpidem  (AMBIEN ) 10 MG tablet Take 10 mg by mouth at bedtime.    [provider]     Allergies: Aspirin, Bee venom, Ibuprofen, Ketamine , Ketorolac , Lorazepam , Toradol  [ketorolac  tromethamine ], Tramadol hcl, Methotrexate, and Iodine    Review of Systems Negative except as per HPI Updated Vital Signs BP 129/73 (BP Location: Left Arm)   Pulse 68   Temp 97.8 F (36.6 C) (Oral)   Resp 18   Ht 5' 4 (1.626 m)   Wt 56.7 kg   SpO2 98%   BMI 21.46 kg/m   Physical Exam Vitals and nursing note reviewed.  Constitutional:      General: She is not in acute distress.    Appearance: She is well-developed. She is not diaphoretic.  HENT:     Head: Normocephalic and atraumatic.  Cardiovascular:     Rate and Rhythm: Normal rate and regular rhythm.     Heart sounds: Normal heart sounds.  Pulmonary:     Effort: Pulmonary effort is normal.     Breath sounds: Normal breath sounds.  Abdominal:     Palpations: Abdomen is soft.     Tenderness: There is abdominal tenderness. There is no guarding or rebound.  Skin:    General: Skin is warm and dry.     Findings: No erythema or rash.  Neurological:     Mental Status: She is alert and oriented to person, place, and time.  Psychiatric:        Behavior: Behavior normal.     (all labs ordered are listed, but only abnormal results are displayed) Labs Reviewed  CBC WITH DIFFERENTIAL/PLATELET - Abnormal; Notable for the following components:      Result Value   MCH 25.4 (*)    RDW 16.0 (*)    Platelets 420 (*)    All other components within normal limits  COMPREHENSIVE METABOLIC PANEL WITH GFR - Abnormal; Notable for the following components:   Glucose, Bld 120 (*)    Alkaline Phosphatase 131 (*)    All other components within normal limits  URINALYSIS, ROUTINE W REFLEX MICROSCOPIC - Abnormal; Notable for the following components:   APPearance HAZY (*)    Specific Gravity, Urine 1.036 (*)    Leukocytes,Ua MODERATE (*)    Bacteria, UA RARE (*)    All other components within normal limits  LIPASE, BLOOD     EKG: None  Radiology: CT ABDOMEN PELVIS W CONTRAST Result Date: 12/23/2023 EXAM: CT ABDOMEN AND PELVIS WITH CONTRAST 12/23/2023 12:17:38 PM TECHNIQUE: CT of the abdomen and pelvis was performed with the administration of 100 mL of iohexol  (OMNIPAQUE ) 300 MG/ML solution. Multiplanar reformatted images are provided for review. Automated exposure control, iterative reconstruction, and/or weight-based adjustment of the mA/kV was utilized to reduce the radiation dose to as low as reasonably achievable. COMPARISON: 04/10/2023 CLINICAL HISTORY: Abdominal pain, acute, nonlocalized. Patient complains of pelvic pain for 3 days, stating it feels like a fibromyalgia flare-up. Patient was asleep upon EMS arrival. Patient denies taking any pain medications or over-the-counter medications. Diarrhea for 2  months. Per ordering MD, Leita DELENA Chancy, PA-C, patient has tolerated iodinated contrast in the past despite listed iodine allergy. FINDINGS: LOWER CHEST: Dependent changes noted in the lung bases. Calcified granuloma within the Lingula. LIVER: Unchanged liver cysts. The largest is in the inferior right lobe measuring 1.4 cm. GALLBLADDER AND BILE DUCTS: Mild wall thickening within the gallbladder fundus measuring up to 3.5 mm, image 39/11. No calcified gallstones. Common bile duct measures up to 6 mm. No intrahepatic bile duct dilatation. SPLEEN: No acute abnormality. PANCREAS: No acute abnormality. ADRENAL GLANDS: No acute abnormality. KIDNEYS, URETERS AND BLADDER: No stones in the kidneys or ureters. No hydronephrosis. No perinephric or periureteral stranding. Streak artifact from right hip arthroplasty device partially obscures pelvic organs including the urinary bladder. No focal bladder abnormality noted. GI AND BOWEL: Stomach demonstrates no acute abnormality. The appendix is visualized and appears normal. Colonic diverticulosis without signs of acute diverticulitis. There is no bowel obstruction. PERITONEUM AND  RETROPERITONEUM: No free fluid or fluid collections. No free air. VASCULATURE: Aorta is normal in caliber. Aortic atherosclerotic calcification. LYMPH NODES: No pelvic or inguinal adenopathy. REPRODUCTIVE ORGANS: Uterus is unremarkable. Right ovary cyst measures 2.1 cm. Simple-appearing left ovary cyst measures 2 cm, image 69/2. BONES AND SOFT TISSUES: Postoperative changes within the lumbar spine from previous posterior rod and screw fixation L3 through S1 with side plate fixation of the L2-3 vertebra. Multilevel lumbar spondylosis, advanced. Streak artifact from right hip arthroplasty device partially obscures pelvic organs. No focal soft tissue abnormality. IMPRESSION: 1. No definite acute findings in the abdomen or pelvis 2. Equivocal wall thickening involving the gastric fundus which may reflect incomplete distention. If there are clinical signs or symptoms of acute cholecystitis, consider further evaluation with gallbladder sonogram. 3. Sigmoid diverticulosis without signs of acute diverticulitis. 4. Advanced multilevel lumbar spondylosis 5. Aortic atherosclerotic calcification Electronically signed by: Waddell Calk MD 12/23/2023 12:42 PM EDT RP Workstation: [id number]     Procedures   Medications Ordered in the ED  sodium chloride  0.9 % bolus 1,000 mL (0 mLs Intravenous Stopped 12/23/23 1205)  ondansetron  (ZOFRAN ) injection 4 mg (4 mg Intravenous Given 12/23/23 1039)  morphine  (PF) 4 MG/ML injection 4 mg (4 mg Intravenous Given 12/23/23 1040)  iohexol  (OMNIPAQUE ) 300 MG/ML solution 100 mL (100 mLs Intravenous Contrast Given 12/23/23 1157)  oxyCODONE -acetaminophen  (PERCOCET/ROXICET) 5-325 MG per tablet 1 tablet (1 tablet Oral Given 12/23/23 1420)                                    Medical Decision Making Amount and/or Complexity of Data Reviewed Labs: ordered. Radiology: ordered.  Risk Prescription drug management.   This patient presents to the ED for concern of abdominal pain, this  involves an extensive number of treatment options, and is a complaint that carries with it a high risk of complications and morbidity.  The differential diagnosis includes but not limited to kidney stone, colitis, diverticulitis, msk pain   Co morbidities / Chronic conditions that complicate the patient evaluation  As listed and reviewed in chart and HPI   Additional history obtained:  Additional history obtained from EMR External records from outside source obtained and reviewed including prior labs on file   Lab Tests:  I Ordered, and personally interpreted labs.  The pertinent results include: CBC without significant findings.  CMP without significant findings, elevated alk phos at baseline.  Lipase normal.  Urinalysis with moderate leukocytes, 11-20  WBC and rare bacteria    Imaging Studies ordered:  I ordered imaging studies including CT abdomen pelvis I independently visualized and interpreted imaging which showed no acute findings. I agree with the radiologist interpretation   Problem List / ED Course / Critical interventions / Medication management  68 year old female presents with complaint of left-sided abdominal pain which radiates around her abdomen into her back.  She is found to have generalized discomfort on exam without guarding or rebound.  Her labs are reassuring.  CT without acute findings.  Concerning the question of gallbladder wall thickening on her CT, patient is not specifically tender in the right upper quadrant, no reports of vomiting, she is afebrile with normal white count and normal LFTs.  Prescribed Bentyl and recommend follow-up with PCP for recheck.  Urinalysis returns with possible urinary tract infection, will cover with Keflex . I ordered medication including morphine , Zofran , IV fluids Reevaluation of the patient after these medicines showed that the patient continues to report pain I have reviewed the patients home medicines and have made adjustments  as needed   Social Determinants of Health:  Has PCP   Test / Admission - Considered:  Stable for discharge      Final diagnoses:  Generalized abdominal pain  Urinary tract infection in female    ED Discharge Orders          Ordered    dicyclomine (BENTYL) 20 MG tablet  2 times daily        12/23/23 1258    cephALEXin  (KEFLEX ) 500 MG capsule  2 times daily        12/23/23 1428               Beverley Leita LABOR, PA-C 12/23/23 1432    Geraldene Hamilton, MD 12/25/23 2216

## 2024-01-17 ENCOUNTER — Ambulatory Visit (HOSPITAL_BASED_OUTPATIENT_CLINIC_OR_DEPARTMENT_OTHER)
Admission: RE | Admit: 2024-01-17 | Discharge: 2024-01-17 | Disposition: A | Discharge status: Home / Self Care | Source: Ambulatory Visit | Attending: Nurse Practitioner | Admitting: Nurse Practitioner

## 2024-01-17 DIAGNOSIS — Z780 Asymptomatic menopausal state: Secondary | ICD-10-CM | POA: Insufficient documentation

## 2024-01-17 DIAGNOSIS — E2839 Other primary ovarian failure: Secondary | ICD-10-CM | POA: Diagnosis present

## 2024-01-18 ENCOUNTER — Other Ambulatory Visit: Payer: Self-pay | Admitting: Nurse Practitioner

## 2024-01-18 DIAGNOSIS — Z1231 Encounter for screening mammogram for malignant neoplasm of breast: Secondary | ICD-10-CM

## 2024-01-22 ENCOUNTER — Encounter

## 2024-01-22 DIAGNOSIS — Z1231 Encounter for screening mammogram for malignant neoplasm of breast: Secondary | ICD-10-CM

## 2024-02-05 ENCOUNTER — Ambulatory Visit: Admitting: Gastroenterology

## 2024-02-19 ENCOUNTER — Inpatient Hospital Stay: Admission: RE | Admit: 2024-02-19 | Source: Ambulatory Visit
